# Patient Record
Sex: Female | Born: 1940 | Race: White | Hispanic: No | Marital: Married
Health system: Midwestern US, Community
[De-identification: ages and names within clinical notes are randomized; demographics above are authoritative.]

## PROBLEM LIST (undated history)

## (undated) DIAGNOSIS — E119 Type 2 diabetes mellitus without complications: Secondary | ICD-10-CM

## (undated) DIAGNOSIS — M25561 Pain in right knee: Secondary | ICD-10-CM

## (undated) DIAGNOSIS — C3412 Malignant neoplasm of upper lobe, left bronchus or lung: Secondary | ICD-10-CM

## (undated) DIAGNOSIS — C50911 Malignant neoplasm of unspecified site of right female breast: Secondary | ICD-10-CM

## (undated) DIAGNOSIS — C50919 Malignant neoplasm of unspecified site of unspecified female breast: Secondary | ICD-10-CM

## (undated) DIAGNOSIS — E782 Mixed hyperlipidemia: Secondary | ICD-10-CM

## (undated) DIAGNOSIS — M8580 Other specified disorders of bone density and structure, unspecified site: Secondary | ICD-10-CM

## (undated) DIAGNOSIS — N6310 Unspecified lump in the right breast, unspecified quadrant: Secondary | ICD-10-CM

## (undated) DIAGNOSIS — Z170 Estrogen receptor positive status [ER+]: Secondary | ICD-10-CM

## (undated) DIAGNOSIS — E559 Vitamin D deficiency, unspecified: Secondary | ICD-10-CM

## (undated) DIAGNOSIS — Z1231 Encounter for screening mammogram for malignant neoplasm of breast: Secondary | ICD-10-CM

## (undated) DIAGNOSIS — F330 Major depressive disorder, recurrent, mild: Secondary | ICD-10-CM

## (undated) DIAGNOSIS — M545 Low back pain, unspecified: Secondary | ICD-10-CM

## (undated) DIAGNOSIS — Z780 Asymptomatic menopausal state: Secondary | ICD-10-CM

## (undated) DIAGNOSIS — Z1239 Encounter for other screening for malignant neoplasm of breast: Secondary | ICD-10-CM

## (undated) DIAGNOSIS — I10 Essential (primary) hypertension: Secondary | ICD-10-CM

## (undated) DIAGNOSIS — E781 Pure hyperglyceridemia: Secondary | ICD-10-CM

## (undated) DIAGNOSIS — C7931 Secondary malignant neoplasm of brain: Secondary | ICD-10-CM

## (undated) DIAGNOSIS — G8929 Other chronic pain: Secondary | ICD-10-CM

## (undated) DIAGNOSIS — R198 Other specified symptoms and signs involving the digestive system and abdomen: Secondary | ICD-10-CM

## (undated) DIAGNOSIS — R7989 Other specified abnormal findings of blood chemistry: Secondary | ICD-10-CM

## (undated) DIAGNOSIS — R7301 Impaired fasting glucose: Secondary | ICD-10-CM

## (undated) DIAGNOSIS — M549 Dorsalgia, unspecified: Secondary | ICD-10-CM

## (undated) DIAGNOSIS — R928 Other abnormal and inconclusive findings on diagnostic imaging of breast: Secondary | ICD-10-CM

## (undated) DIAGNOSIS — R918 Other nonspecific abnormal finding of lung field: Secondary | ICD-10-CM

## (undated) DIAGNOSIS — R232 Flushing: Secondary | ICD-10-CM

## (undated) DIAGNOSIS — Z853 Personal history of malignant neoplasm of breast: Secondary | ICD-10-CM

## (undated) DIAGNOSIS — R531 Weakness: Secondary | ICD-10-CM

## (undated) DIAGNOSIS — R059 Cough, unspecified: Secondary | ICD-10-CM

## (undated) DIAGNOSIS — E673 Hypervitaminosis D: Secondary | ICD-10-CM

## (undated) DIAGNOSIS — C3490 Malignant neoplasm of unspecified part of unspecified bronchus or lung: Secondary | ICD-10-CM

## (undated) DIAGNOSIS — Z5112 Encounter for antineoplastic immunotherapy: Secondary | ICD-10-CM

## (undated) DIAGNOSIS — C439 Malignant melanoma of skin, unspecified: Secondary | ICD-10-CM

## (undated) DIAGNOSIS — E7800 Pure hypercholesterolemia, unspecified: Secondary | ICD-10-CM

## (undated) DIAGNOSIS — C4490 Unspecified malignant neoplasm of skin, unspecified: Secondary | ICD-10-CM

## (undated) DIAGNOSIS — R519 Headache, unspecified: Secondary | ICD-10-CM

## (undated) DIAGNOSIS — R51 Headache: Secondary | ICD-10-CM

## (undated) DIAGNOSIS — C801 Malignant (primary) neoplasm, unspecified: Secondary | ICD-10-CM

## (undated) DIAGNOSIS — M1990 Unspecified osteoarthritis, unspecified site: Secondary | ICD-10-CM

## (undated) HISTORY — DX: Other specified disorders of bone density and structure, unspecified site: M85.80

## (undated) HISTORY — DX: Pure hypercholesterolemia, unspecified: E78.00

## (undated) HISTORY — PX: OTHER SURGICAL HISTORY: SHX169

## (undated) HISTORY — DX: Unspecified malignant neoplasm of skin, unspecified: C44.90

## (undated) HISTORY — PX: HYSTEROSCOPY: SHX211

## (undated) HISTORY — DX: Malignant (primary) neoplasm, unspecified: C80.1

## (undated) HISTORY — DX: Malignant melanoma of skin, unspecified: C43.9

---
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
## 1999-03-01 ENCOUNTER — Other Ambulatory Visit: Admission: RE | Admit: 1999-03-01 | Discharge: 1999-03-01 | Payer: Self-pay | Admitting: Obstetrics and Gynecology

## 2000-03-27 ENCOUNTER — Other Ambulatory Visit: Admission: RE | Admit: 2000-03-27 | Discharge: 2000-03-27 | Payer: Self-pay | Admitting: Obstetrics and Gynecology

## 2000-04-05 ENCOUNTER — Encounter: Admission: RE | Admit: 2000-04-05 | Discharge: 2000-04-05 | Payer: Self-pay | Admitting: Obstetrics and Gynecology

## 2000-04-05 ENCOUNTER — Encounter: Payer: Self-pay | Admitting: Obstetrics and Gynecology

## 2001-08-14 ENCOUNTER — Other Ambulatory Visit: Admission: RE | Admit: 2001-08-14 | Discharge: 2001-08-14 | Payer: Self-pay | Admitting: Obstetrics and Gynecology

## 2002-10-29 ENCOUNTER — Other Ambulatory Visit: Admission: RE | Admit: 2002-10-29 | Discharge: 2002-10-29 | Payer: Self-pay | Admitting: Obstetrics and Gynecology

## 2003-11-12 ENCOUNTER — Other Ambulatory Visit: Admission: RE | Admit: 2003-11-12 | Discharge: 2003-11-12 | Payer: Self-pay | Admitting: Obstetrics and Gynecology

## 2005-08-04 ENCOUNTER — Other Ambulatory Visit: Admission: RE | Admit: 2005-08-04 | Discharge: 2005-08-04 | Payer: Self-pay | Admitting: Obstetrics and Gynecology

## 2006-09-18 ENCOUNTER — Other Ambulatory Visit: Admission: RE | Admit: 2006-09-18 | Discharge: 2006-09-18 | Payer: Self-pay | Admitting: Obstetrics and Gynecology

## 2007-07-26 ENCOUNTER — Ambulatory Visit: Payer: Self-pay | Admitting: Internal Medicine

## 2007-08-09 ENCOUNTER — Ambulatory Visit: Payer: Self-pay | Admitting: Internal Medicine

## 2008-03-13 ENCOUNTER — Other Ambulatory Visit: Admission: RE | Admit: 2008-03-13 | Discharge: 2008-03-13 | Payer: Self-pay | Admitting: Obstetrics and Gynecology

## 2008-03-27 DIAGNOSIS — E785 Hyperlipidemia, unspecified: Secondary | ICD-10-CM | POA: Insufficient documentation

## 2008-06-16 ENCOUNTER — Ambulatory Visit (HOSPITAL_COMMUNITY): Admission: RE | Admit: 2008-06-16 | Discharge: 2008-06-16 | Payer: Self-pay | Admitting: Obstetrics and Gynecology

## 2009-01-15 NOTE — Procedures (Deleted)
 [facility]   [phone]   [place] [address]    Name: KENLEI, SAFI Service Date: 01/10/2009  DOB: September 01, 1941 Ordered by:  MR # [id number] Location:  Sex: F Age: 68  Billing #: [id number] Date of Adm: 01/10/2009       BILATERAL LOWER EXTREMITY VENOUS DUPLEX    REQUESTED BY: Argentina Donovan, M.D.    CLINICAL DIAGNOSIS/INDICATIONS: This is a 68 year old female with  left-sided weakness, TIA.    FINDINGS: Wallace Cullens scale and color flow duplex images of the extracranial,  carotid and vertebral arteries were performed bilaterally demonstrating  plaquing in the right proximal ICA, marked tortuosity in the distal ICA  with flow acceleration appears to be at 160 and end diastolic of 34 in the  distal ICA. Again minimal plaquing seen in the proximal ICA without  hemodynamically significant flow disturbance. Vertebrals and external  carotid arteries bilaterally.    INTERPRETATION: There is 0-49% right ICA stenosis possibly related to  tortuosity. Externals and vertebrals have normal antegrade flow.        Preliminary  Ross Marcus, MD    cc: Lucille Passy. Port, M.D.   Ross Marcus, MD        CDS/dhm; D: 01/15/2009 11:39 A; T: 01/15/2009 1:11 P; DOC# [id number]; JOB#  [id number]

## 2009-01-16 NOTE — Procedures (Signed)
Procedures filed by Davy Pique, MD at 02/02/09 [phone]                Author: Davy Pique, MD  Service: --  Author Type: Physician       Filed: 02/02/09 0754  Date of Service: 01/16/09 0905  Status: Addendum          Editor: Davy Pique, MD (Physician)            Procedure Orders        1. TRANSCRIBED CAROTID DUPLEX SCAN [[id number]] ordered by  at 01/16/09 0905                         <!--EPICS-->                   [place] [facility]<BR>                         [address]<BR>                             [place], [place] [address]<BR> <BR> <BR> Name:      Dawn Green, Dawn Green                  Service Date:   01/10/2009<BR> DOB:       26-Nov-1940                     Ordered by:<BR> MR #       [id number]                      Location:<BR> Sex:       F                               Age:            67<BR> Billing #: [id number]                   Date of Adm:    01/10/2009<BR> <BR>                             CAROTID DUPLEX SCAN<BR> <BR> REQUESTED BY:  Argentina Donovan, M.D.<BR> <BR> CLINICAL DIAGNOSIS/INDICATIONS:  This is a 68 year old female with<BR> left-sided weakness, TIA.<BR> <BR> FINDINGS:  Gray scale and color flow duplex images of the extracranial,<BR> carotid and vertebral arteries were performed bilaterally demonstrating<BR> plaquing in the right proximal  ICA, marked tortuosity in the distal ICA<BR> with flow acceleration appears to be at 160 and end diastolic of 34 in the<BR> distal ICA.  Again minimal plaquing seen in the proximal ICA without<BR> hemodynamically significant flow disturbance.  Vertebrals  and external<BR> carotid arteries bilaterally.<BR> <BR> INTERPRETATION:  There is 0-49% right ICA stenosis possibly related to<BR> tortuosity.  Externals and vertebrals have normal antegrade flow.<BR> <BR> <BR> E-Signed By<BR> Ross Marcus, MD 02/02/2009  07:54<BR> Teea Ducey D Tarron Krolak, MD<BR> <BR> cc:   Lucille Passy. Port, M.D.<BR>       Godwin Tedesco D Suhana Wilner, MD<BR>  <BR> <BR> <BR> CDS/dhm; D:  01/15/2009 11:39 A; T:  01/16/2009  9:05 A; DOC# [id number]; JOB#<BR> [id number]<WN> <!--EPICE-->

## 2009-01-16 NOTE — Procedures (Addendum)
[place] [facility]   [address]   [place], [place] [address]      Name: Dawn Green, Dawn Green Service Date: 01/10/2009  DOB: 02/06/1941 Ordered by:  MR # [id number] Location:  Sex: F Age: 68  Billing #: [id number] Date of Adm: 01/10/2009     CAROTID DUPLEX SCAN    REQUESTED BY: Argentina Donovan, M.D.    CLINICAL DIAGNOSIS/INDICATIONS: This is a 68 year old female with  left-sided weakness, TIA.    FINDINGS: Wallace Cullens scale and color flow duplex images of the extracranial,  carotid and vertebral arteries were performed bilaterally demonstrating  plaquing in the right proximal ICA, marked tortuosity in the distal ICA  with flow acceleration appears to be at 160 and end diastolic of 34 in the  distal ICA. Again minimal plaquing seen in the proximal ICA without  hemodynamically significant flow disturbance. Vertebrals and external  carotid arteries bilaterally.    INTERPRETATION: There is 0-49% right ICA stenosis possibly related to  tortuosity. Externals and vertebrals have normal antegrade flow.      E-Signed By  Ross Marcus, MD 02/02/2009 07:54  Ross Marcus, MD    cc: Lucille Passy. Port, M.D.   Ross Marcus, MD        CDS/dhm; D: 01/15/2009 11:39 A; T: 01/16/2009 9:05 A; DOC# [id number]; JOB#  [id number]

## 2010-02-05 NOTE — Telephone Encounter (Signed)
Spoke with pt. Mammo ok follow up 1 year.

## 2012-01-20 ENCOUNTER — Encounter

## 2012-01-27 DIAGNOSIS — H5310 Unspecified subjective visual disturbances: Secondary | ICD-10-CM | POA: Diagnosis not present

## 2012-02-27 ENCOUNTER — Other Ambulatory Visit (HOSPITAL_COMMUNITY): Payer: Self-pay | Admitting: Internal Medicine

## 2012-02-27 DIAGNOSIS — R635 Abnormal weight gain: Secondary | ICD-10-CM | POA: Diagnosis not present

## 2012-02-27 DIAGNOSIS — Z23 Encounter for immunization: Secondary | ICD-10-CM | POA: Diagnosis not present

## 2012-02-27 DIAGNOSIS — C439 Malignant melanoma of skin, unspecified: Secondary | ICD-10-CM | POA: Diagnosis not present

## 2012-02-27 DIAGNOSIS — Z1231 Encounter for screening mammogram for malignant neoplasm of breast: Secondary | ICD-10-CM

## 2012-02-27 DIAGNOSIS — R9431 Abnormal electrocardiogram [ECG] [EKG]: Secondary | ICD-10-CM | POA: Diagnosis not present

## 2012-03-05 ENCOUNTER — Other Ambulatory Visit: Payer: Self-pay

## 2012-03-05 ENCOUNTER — Ambulatory Visit (HOSPITAL_COMMUNITY): Payer: Medicare Other | Attending: Internal Medicine

## 2012-03-05 ENCOUNTER — Other Ambulatory Visit (HOSPITAL_COMMUNITY): Payer: Self-pay | Admitting: Radiology

## 2012-03-05 DIAGNOSIS — I059 Rheumatic mitral valve disease, unspecified: Secondary | ICD-10-CM | POA: Insufficient documentation

## 2012-03-05 DIAGNOSIS — R9431 Abnormal electrocardiogram [ECG] [EKG]: Secondary | ICD-10-CM

## 2012-03-05 DIAGNOSIS — I519 Heart disease, unspecified: Secondary | ICD-10-CM | POA: Diagnosis not present

## 2012-03-05 DIAGNOSIS — E785 Hyperlipidemia, unspecified: Secondary | ICD-10-CM | POA: Insufficient documentation

## 2012-03-06 ENCOUNTER — Encounter (HOSPITAL_COMMUNITY): Payer: Self-pay | Admitting: Internal Medicine

## 2012-03-22 DIAGNOSIS — F4322 Adjustment disorder with anxiety: Secondary | ICD-10-CM | POA: Diagnosis not present

## 2012-03-23 ENCOUNTER — Ambulatory Visit (HOSPITAL_COMMUNITY)
Admission: RE | Admit: 2012-03-23 | Discharge: 2012-03-23 | Disposition: A | Discharge status: Home / Self Care | Payer: Medicare Other | Source: Ambulatory Visit | Attending: Internal Medicine | Admitting: Internal Medicine

## 2012-03-23 DIAGNOSIS — Z1231 Encounter for screening mammogram for malignant neoplasm of breast: Secondary | ICD-10-CM | POA: Diagnosis not present

## 2012-04-03 DIAGNOSIS — Z780 Asymptomatic menopausal state: Secondary | ICD-10-CM | POA: Diagnosis not present

## 2012-04-03 DIAGNOSIS — L570 Actinic keratosis: Secondary | ICD-10-CM | POA: Diagnosis not present

## 2012-04-03 DIAGNOSIS — Z85820 Personal history of malignant melanoma of skin: Secondary | ICD-10-CM | POA: Diagnosis not present

## 2012-04-06 DIAGNOSIS — L255 Unspecified contact dermatitis due to plants, except food: Secondary | ICD-10-CM | POA: Diagnosis not present

## 2012-04-06 DIAGNOSIS — IMO0002 Reserved for concepts with insufficient information to code with codable children: Secondary | ICD-10-CM | POA: Diagnosis not present

## 2012-04-09 DIAGNOSIS — L255 Unspecified contact dermatitis due to plants, except food: Secondary | ICD-10-CM | POA: Diagnosis not present

## 2012-04-11 DIAGNOSIS — F4322 Adjustment disorder with anxiety: Secondary | ICD-10-CM | POA: Diagnosis not present

## 2012-05-03 DIAGNOSIS — F4322 Adjustment disorder with anxiety: Secondary | ICD-10-CM | POA: Diagnosis not present

## 2012-06-01 DIAGNOSIS — F4322 Adjustment disorder with anxiety: Secondary | ICD-10-CM | POA: Diagnosis not present

## 2012-06-14 DIAGNOSIS — F4322 Adjustment disorder with anxiety: Secondary | ICD-10-CM | POA: Diagnosis not present

## 2012-07-05 DIAGNOSIS — F4322 Adjustment disorder with anxiety: Secondary | ICD-10-CM | POA: Diagnosis not present

## 2012-07-26 DIAGNOSIS — F4322 Adjustment disorder with anxiety: Secondary | ICD-10-CM | POA: Diagnosis not present

## 2012-08-23 DIAGNOSIS — E559 Vitamin D deficiency, unspecified: Secondary | ICD-10-CM | POA: Diagnosis not present

## 2012-08-23 DIAGNOSIS — E785 Hyperlipidemia, unspecified: Secondary | ICD-10-CM | POA: Diagnosis not present

## 2012-08-23 DIAGNOSIS — Z0000 Encounter for general adult medical examination without abnormal findings: Secondary | ICD-10-CM | POA: Diagnosis not present

## 2012-08-29 DIAGNOSIS — E559 Vitamin D deficiency, unspecified: Secondary | ICD-10-CM | POA: Diagnosis not present

## 2012-08-29 DIAGNOSIS — M25519 Pain in unspecified shoulder: Secondary | ICD-10-CM | POA: Diagnosis not present

## 2012-08-29 DIAGNOSIS — Z1331 Encounter for screening for depression: Secondary | ICD-10-CM | POA: Diagnosis not present

## 2012-08-29 DIAGNOSIS — R9431 Abnormal electrocardiogram [ECG] [EKG]: Secondary | ICD-10-CM | POA: Diagnosis not present

## 2012-09-05 DIAGNOSIS — F4322 Adjustment disorder with anxiety: Secondary | ICD-10-CM | POA: Diagnosis not present

## 2012-09-11 DIAGNOSIS — M25519 Pain in unspecified shoulder: Secondary | ICD-10-CM | POA: Diagnosis not present

## 2012-09-18 DIAGNOSIS — Z85820 Personal history of malignant melanoma of skin: Secondary | ICD-10-CM | POA: Diagnosis not present

## 2012-10-19 DIAGNOSIS — F4322 Adjustment disorder with anxiety: Secondary | ICD-10-CM | POA: Diagnosis not present

## 2013-01-18 ENCOUNTER — Encounter

## 2013-02-13 NOTE — Progress Notes (Signed)
Returned patient's phone call.  Patient wanting to know what to expect for ultrasound guided biopsy.  Explained procedure to patient with her understanding.  Instructed patient to call if she had any other questions.

## 2013-03-07 ENCOUNTER — Encounter

## 2013-03-07 DIAGNOSIS — H43819 Vitreous degeneration, unspecified eye: Secondary | ICD-10-CM | POA: Diagnosis not present

## 2013-03-07 DIAGNOSIS — H2510 Age-related nuclear cataract, unspecified eye: Secondary | ICD-10-CM | POA: Diagnosis not present

## 2013-03-07 DIAGNOSIS — H4011X0 Primary open-angle glaucoma, stage unspecified: Secondary | ICD-10-CM | POA: Diagnosis not present

## 2013-03-07 DIAGNOSIS — H52209 Unspecified astigmatism, unspecified eye: Secondary | ICD-10-CM | POA: Diagnosis not present

## 2013-03-07 NOTE — Progress Notes (Addendum)
HISTORY OF PRESENT ILLNESS  Dawn Green is a 72 y.o. female.  HPI ESTABLISHED patient here for recent discovery of RIGHT breast mass found on screening ma\\mmogram.  Patient cannot feel a mass.  She has no pain, nipple discharge/retraction, edema or discoloration.  Family history: mother diagnosed with ovarian cancer at age 39, deceased at age 22.  Paternal aunt diagnosed with pancreatic cancer at age 68    02/13/13 Mammogram/Ultrasound:   There is a persistent 8 mm nodular focus at the 12:00 position   of the right breast 2.5 cm from the nipple. There is an additional   ill-defined 9.4 mm nodular focus located at the 9:00 position of the right   breast, approximately 6.6 cm from the nipple, that partially compresses with   fat.     Solid mass at the 12:00 position of the right breast, corresponding to the   mammographic finding. Ultrasound-guided biopsy is recommended.   Probable lymph node or fibroglandular island at the 9:00 position of the   right breast, for which a six-month followup mammogram is recommended.   The patient was notified of these results. The patient will be referred to   Dr. Zonia Kief.   BIRADS 4: Suspicious: Biopsy is recommended.   CC: Dawn Comings, MD   Signing/Reading Doctor: Monico Blitz [phone])   Approved: Monico Blitz [phone]) Feb 13 2013 2:11PM   Review of Systems   Constitutional: Negative.    HENT: Positive for tinnitus.    Eyes:        Glaucoma, glasses,bilateral cataract w/ IOL   Respiratory: Negative.    Cardiovascular: Negative.    Gastrointestinal:        Lactose intolerant   Genitourinary: Positive for frequency.   Musculoskeletal:        RIHGT shoulder pain   Skin: Positive for itching.   Neurological: Negative.    Endo/Heme/Allergies: Negative.    Psychiatric/Behavioral: Negative.        Physical Exam   Constitutional: She is oriented to person, place, and time. She appears well-developed and well-nourished.   Cardiovascular: Normal rate, regular rhythm and normal heart  sounds.    Pulmonary/Chest: Effort normal and breath sounds normal. Right breast exhibits no mass, no nipple discharge and no skin change. Left breast exhibits no mass, no nipple discharge and no skin change. Breasts are symmetrical.   Lymphadenopathy:     She has no cervical adenopathy.     She has no axillary adenopathy.        Right: No supraclavicular adenopathy present.        Left: No supraclavicular adenopathy present.   Neurological: She is alert and oriented to person, place, and time.   Skin: Skin is warm and dry.     US - Guided Core Biopsy  Indication : Right Breast mass 12:00 1/3.  not palpable.  Ultrasound Findings: 8mm irregular mass 12:00 1/3  Prep : alcohol.   Anesthesia : 1% lidocaine with epinephrine, 5cc.    Device : The suction needle device was advanced through the lesion and captured tissue with real-time Ultrasound Confirmation.   Core Sampling :  4 cores were obtained.    Marker: clip placed   Dressing : Steristrips, gauze and tape.   Instructions : Remove gauze this evening.  Remove steristrips in one week.   Tolerance: Pt tolerated procedure with no discomfort  Pathology :  Infiltrating ductal carcinoma, grade 2  Concordance: yes  ASSESSMENT and PLAN    ICD-9-CM  1. Breast mass 611.72     Called patient with pathology report.  Will schedule right lumpectomy and sentinel lymph node biopsy.      [facility]  OFFICE PROCEDURE TIME OUT NOTE  Chart reviewed for the following:   I, Dawn Comings, MD, have reviewed the History, Physical and updated the Allergic reactions for Dawn Green   TIME OUT performed immediately prior to start of procedure:   I, Dawn Comings, MD, have performed the following reviews on Dawn Green prior to the start of the procedure:          * Patient was identified by name and date of birth   * Agreement on procedure being performed was verified  * Risks and Benefits explained to the patient  * Procedure site verified and marked as necessary  * Patient was  positioned for comfort  * Consent was signed and verified   Time: 10:20am  Date of procedure: 03/07/2013  Procedure performed by:  Dawn Comings, MD  Provider assisted by: Ivin Poot  LPN  How tolerated by patient: tolerated the procedure well with no complications

## 2013-03-12 NOTE — Communication Body (Signed)
HISTORY OF PRESENT ILLNESS  Dawn Green is a 72 y.o. female.  HPI ESTABLISHED patient here for recent discovery of RIGHT breast mass found on screening ma\\mmogram.  Patient cannot feel a mass.  She has no pain, nipple discharge/retraction, edema or discoloration.  Family history: mother diagnosed with ovarian cancer at age 70, deceased at age 44.  Paternal aunt diagnosed with pancreatic cancer at age 10    02/13/13 Mammogram/Ultrasound:   There is a persistent 8 mm nodular focus at the 12:00 position   of the right breast 2.5 cm from the nipple. There is an additional   ill-defined 9.4 mm nodular focus located at the 9:00 position of the right   breast, approximately 6.6 cm from the nipple, that partially compresses with   fat.     Solid mass at the 12:00 position of the right breast, corresponding to the   mammographic finding. Ultrasound-guided biopsy is recommended.   Probable lymph node or fibroglandular island at the 9:00 position of the   right breast, for which a six-month followup mammogram is recommended.   The patient was notified of these results. The patient will be referred to   Dr. Zonia Kief.   BIRADS 4: Suspicious: Biopsy is recommended.   CC: [name], MD   Signing/Reading Doctor: Monico Blitz [phone])   Approved: Monico Blitz [phone]) Feb 13 2013 2:11PM   Review of Systems   Constitutional: Negative.    HENT: Positive for tinnitus.    Eyes:        Glaucoma, glasses,bilateral cataract w/ IOL   Respiratory: Negative.    Cardiovascular: Negative.    Gastrointestinal:        Lactose intolerant   Genitourinary: Positive for frequency.   Musculoskeletal:        RIHGT shoulder pain   Skin: Positive for itching.   Neurological: Negative.    Endo/Heme/Allergies: Negative.    Psychiatric/Behavioral: Negative.        Physical Exam   Constitutional: She is oriented to person, place, and time. She appears well-developed and well-nourished.   Cardiovascular: Normal rate, regular rhythm and normal heart  sounds.    Pulmonary/Chest: Effort normal and breath sounds normal. Right breast exhibits no mass, no nipple discharge and no skin change. Left breast exhibits no mass, no nipple discharge and no skin change. Breasts are symmetrical.   Lymphadenopathy:     She has no cervical adenopathy.     She has no axillary adenopathy.        Right: No supraclavicular adenopathy present.        Left: No supraclavicular adenopathy present.   Neurological: She is alert and oriented to person, place, and time.   Skin: Skin is warm and dry.     US - Guided Core Biopsy  Indication : Right Breast mass 12:00 1/3.  not palpable.  Ultrasound Findings: 8mm irregular mass 12:00 1/3  Prep : alcohol.   Anesthesia : 1% lidocaine with epinephrine, 5cc.    Device : The suction needle device was advanced through the lesion and captured tissue with real-time Ultrasound Confirmation.   Core Sampling :  4 cores were obtained.    Marker: clip placed   Dressing : Steristrips, gauze and tape.   Instructions : Remove gauze this evening.  Remove steristrips in one week.   Tolerance: Pt tolerated procedure with no discomfort  Pathology :  Infiltrating ductal carcinoma, grade 2  Concordance: yes  ASSESSMENT and PLAN    ICD-9-CM  1. Breast mass 611.72     Called patient with pathology report.  Will schedule right lumpectomy and sentinel lymph node biopsy.      [facility]  OFFICE PROCEDURE TIME OUT NOTE  Chart reviewed for the following:   I, [name], MD, have reviewed the History, Physical and updated the Allergic reactions for [name]   TIME OUT performed immediately prior to start of procedure:   I, [name], MD, have performed the following reviews on [name] prior to the start of the procedure:          * Patient was identified by name and date of birth   * Agreement on procedure being performed was verified  * Risks and Benefits explained to the patient  * Procedure site verified and marked as necessary  * Patient was  positioned for comfort  * Consent was signed and verified   Time: 10:20am  Date of procedure: 03/07/2013  Procedure performed by:  [name], MD  Provider assisted by: Ivin Poot  LPN  How tolerated by patient: tolerated the procedure well with no complications            HISTORY OF PRESENT ILLNESS  Dawn Green is a 72 y.o. female.  HPI ESTABLISHED patient here for recent discovery of RIGHT breast mass found on screening ma\\mmogram.  Patient cannot feel a mass.  She has no pain, nipple discharge/retraction, edema or discoloration.  Family history: mother diagnosed with ovarian cancer at age 30, deceased at age 61.  Paternal aunt diagnosed with pancreatic cancer at age 19    02/13/13 Mammogram/Ultrasound:   There is a persistent 8 mm nodular focus at the 12:00 position   of the right breast 2.5 cm from the nipple. There is an additional   ill-defined 9.4 mm nodular focus located at the 9:00 position of the right   breast, approximately 6.6 cm from the nipple, that partially compresses with   fat.     Solid mass at the 12:00 position of the right breast, corresponding to the   mammographic finding. Ultrasound-guided biopsy is recommended.   Probable lymph node or fibroglandular island at the 9:00 position of the   right breast, for which a six-month followup mammogram is recommended.   The patient was notified of these results. The patient will be referred to   Dr. Zonia Kief.   BIRADS 4: Suspicious: Biopsy is recommended.   CC: [name], MD   Signing/Reading Doctor: Monico Blitz [phone])   Approved: Monico Blitz [phone]) Feb 13 2013 2:11PM   Review of Systems   Constitutional: Negative.    HENT: Positive for tinnitus.    Eyes:        Glaucoma, glasses,bilateral cataract w/ IOL   Respiratory: Negative.    Cardiovascular: Negative.    Gastrointestinal:        Lactose intolerant   Genitourinary: Positive for frequency.   Musculoskeletal:        RIHGT shoulder pain   Skin: Positive for itching.    Neurological: Negative.    Endo/Heme/Allergies: Negative.    Psychiatric/Behavioral: Negative.        Physical Exam   Constitutional: She is oriented to person, place, and time. She appears well-developed and well-nourished.   Cardiovascular: Normal rate, regular rhythm and normal heart sounds.    Pulmonary/Chest: Effort normal and breath sounds normal. Right breast exhibits no mass, no nipple discharge and no skin change. Left breast exhibits no mass, no nipple  discharge and no skin change. Breasts are symmetrical.   Lymphadenopathy:     She has no cervical adenopathy.     She has no axillary adenopathy.        Right: No supraclavicular adenopathy present.        Left: No supraclavicular adenopathy present.   Neurological: She is alert and oriented to person, place, and time.   Skin: Skin is warm and dry.     US - Guided Core Biopsy  Indication : Right Breast mass 12:00 1/3.  not palpable.  Ultrasound Findings: 8mm irregular mass 12:00 1/3  Prep : alcohol.   Anesthesia : 1% lidocaine with epinephrine, 5cc.    Device : The suction needle device was advanced through the lesion and captured tissue with real-time Ultrasound Confirmation.   Core Sampling :  4 cores were obtained.    Marker: clip placed   Dressing : Steristrips, gauze and tape.   Instructions : Remove gauze this evening.  Remove steristrips in one week.   Tolerance: Pt tolerated procedure with no discomfort  Pathology :  Infiltrating ductal carcinoma, grade 2  Concordance: yes  ASSESSMENT and PLAN    ICD-9-CM   1. Breast mass 611.72     Called patient with pathology report.  Will schedule right lumpectomy and sentinel lymph node biopsy.      [facility]  OFFICE PROCEDURE TIME OUT NOTE  Chart reviewed for the following:   I, [name], MD, have reviewed the History, Physical and updated the Allergic reactions for [name]   TIME OUT performed immediately prior to start of procedure:   I, [name], MD, have performed the following  reviews on AMARIONNA ARCA prior to the start of the procedure:          * Patient was identified by name and date of birth   * Agreement on procedure being performed was verified  * Risks and Benefits explained to the patient  * Procedure site verified and marked as necessary  * Patient was positioned for comfort  * Consent was signed and verified   Time: 10:20am  Date of procedure: 03/07/2013  Procedure performed by:  [name], MD  Provider assisted by: Ivin Poot  LPN  How tolerated by patient: tolerated the procedure well with no complications

## 2013-03-12 NOTE — Telephone Encounter (Signed)
right lumpectomy and sentinel lymph node biopsy.

## 2013-03-12 NOTE — Addendum Note (Signed)
Addended by: Wallie Char on: 03/12/2013 09:29 AM     Modules accepted: Level of Service

## 2013-03-19 ENCOUNTER — Encounter

## 2013-03-19 DIAGNOSIS — L820 Inflamed seborrheic keratosis: Secondary | ICD-10-CM | POA: Diagnosis not present

## 2013-03-19 DIAGNOSIS — Z85820 Personal history of malignant melanoma of skin: Secondary | ICD-10-CM | POA: Diagnosis not present

## 2013-03-19 NOTE — Progress Notes (Signed)
Faxed pathology report to PCP as per patients request

## 2013-03-19 NOTE — Telephone Encounter (Signed)
breast MRI

## 2013-03-20 NOTE — Telephone Encounter (Signed)
03/26/2013 1345 - 30 min Mri Scanner [facility] (Resource) [facility]

## 2013-03-26 LAB — CREATININE, POC
Creatinine (POC): 0.8 MG/DL (ref 0.6–1.3)
GFRAA, POC: >60 mL/min/{1.73_m2} (ref >60)
GFRNA, POC: >60 mL/min/{1.73_m2} (ref >60)

## 2013-03-26 MED ORDER — GADOPENTETATE DIMEGLUMINE 10 MMOL/20 ML (469.01 MG/ML) IV
10 mmol/20 mL (469.01 mg/mL) | Freq: Once | INTRAVENOUS | Status: AC
Start: 2013-03-26 — End: 2013-03-26
  Administered 2013-03-26: 18:00:00 via INTRAVENOUS

## 2013-03-26 MED FILL — MAGNEVIST 10 MMOL/20 ML (469.01 MG/ML) INTRAVENOUS SOLUTION: 10 mmol/20 mL (469.01 mg/mL) | INTRAVENOUS | Qty: 20

## 2013-03-26 NOTE — Telephone Encounter (Signed)
Called patient with MRI result    1. Right BI-RADS 6, known malignancy. Left BI-RADS 2, benign.  2. RIGHT BREAST: Known invasive ductal carcinoma at 12:00 in the nipple   line   anteriorly, with no MRI evidence for multicentric disease. This lesion   should be amenable to breast conserving therapy. Please see dimensions and     clip location above for surgical planning.  3. LEFT BREAST: No MRI sign of contralateral malignancy.  4. A summary portfolio has been created for reference and is available in   PACS.

## 2013-04-17 ENCOUNTER — Encounter

## 2013-04-17 LAB — METABOLIC PANEL, BASIC
Anion gap: 6 mmol/L (ref 5–15)
BUN/Creatinine ratio: 30 — ABNORMAL HIGH (ref 12–20)
BUN: 17 MG/DL (ref 6–20)
CO2: 33 mmol/L — ABNORMAL HIGH (ref 21–32)
Calcium: 9.6 MG/DL (ref 8.5–10.1)
Chloride: 104 mmol/L (ref 97–108)
Creatinine: 0.56 MG/DL (ref 0.45–1.15)
GFR est AA: >60 mL/min/{1.73_m2} (ref >60)
GFR est non-AA: >60 mL/min/{1.73_m2} (ref >60)
Glucose: 98 mg/dL (ref 65–100)
Potassium: 4.3 mmol/L (ref 3.5–5.1)
Sodium: 143 mmol/L (ref 136–145)

## 2013-04-17 LAB — CBC WITH AUTOMATED DIFF
ABS. BASOPHILS: 0.1 10*3/uL (ref 0.0–0.1)
ABS. EOSINOPHILS: 0.2 10*3/uL (ref 0.0–0.4)
ABS. LYMPHOCYTES: 3 10*3/uL (ref 0.8–3.5)
ABS. MONOCYTES: 0.6 10*3/uL (ref 0.0–1.0)
ABS. NEUTROPHILS: 3 10*3/uL (ref 1.8–8.0)
BASOPHILS: 1 % (ref 0–1)
EOSINOPHILS: 3 % (ref 0–7)
HCT: 41.4 % (ref 35.0–47.0)
HGB: 13.9 g/dL (ref 11.5–16.0)
LYMPHOCYTES: 43 % (ref 12–49)
MCH: 30.8 PG (ref 26.0–34.0)
MCHC: 33.6 g/dL (ref 30.0–36.5)
MCV: 91.8 FL (ref 80.0–99.0)
MONOCYTES: 9 % (ref 5–13)
NEUTROPHILS: 44 % (ref 32–75)
PLATELET: 256 10*3/uL (ref 150–400)
RBC: 4.51 M/uL (ref 3.80–5.20)
RDW: 12.9 % (ref 11.5–14.5)
WBC: 6.9 10*3/uL (ref 3.6–11.0)

## 2013-04-17 LAB — EKG, 12 LEAD, INITIAL
Atrial Rate: 57 {beats}/min
Calculated P Axis: 50 degrees
Calculated R Axis: 7 degrees
Calculated T Axis: 54 degrees
P-R Interval: 180 ms
Q-T Interval: 424 ms
QRS Duration: 88 ms
QTC Calculation (Bezet): 412 ms
Ventricular Rate: 57 {beats}/min

## 2013-04-17 NOTE — Other (Signed)
[facility]  PREOPERATIVE INSTRUCTIONS    Surgery Date:   Wednesday 04/24/13   Surgery arrival time given by surgeon: YES 6:00am per Dr. Zonia Kief instructions    1. Please report at the designated time to the 2nd Floor Admitting Desk.  Bring your insurance card, photo identification, and any copayment ( if applicable).  2. You must have a responsible adult to drive you home. You need to have a responsible adult to stay with you the first 24 hours after surgery if you are going home the same day of your surgery and you should not drive a car for 24 hours following your surgery.  3. Nothing to eat or drink after midnight the night before surgery. This includes no water, gum, mints, coffee, juice, etc.  Please note special instructions, if applicable, below for medications.  4. MEDICATIONS TO TAKE THE MORNING OF SURGERY WITH A SIP OF WATER: Lipitor, Diovan.  Use eye drops.  5. No alcoholic beverages 24 hours before or after your surgery.  6. If you are being admitted to the hospital,please leave personal belongings/luggage in your car until you have an assigned hospital room number.  7. Stop Aspirin and/or any non-steroidal anti-inflammatory drugs (i.e. Ibuprofen, Naproxen, Advil, Aleve) as directed by your surgeon.  You may take Tylenol.        Stop herbal supplements 1 week prior to  surgery.  8. If you are currently taking Plavix, Coumadin,or any other blood-thinning/anticoagulant medication contact your surgeon for instructions.  9. Please wear comfortable clothes. Wear your glasses instead of contacts. We ask that all money, jewelry and valuables be left at home. Wear no make up, particularly mascara, the day of surgery.   10.  All body piercings, rings,and jewelry need to be removed and left at home.    Please wear your hair loose or down. Please no pony-tails, buns, or any metal hair accessories. If you shower the morning of surgery, please do not apply any lotions, powders, or deodorants afterwards.   Do not shave any  body area within 24 hours of your surgery.  11. Please follow all instructions to avoid any potential surgical cancellation.  12.  Should your physical condition change, (i.e. fever, cold, flu, etc.) please notify your surgeon as soon as possible.  13. It is important to be on time. If a situation occurs where you may be delayed, please call: [phone] on the day of surgery.  14. The Preadmission Testing staff can be reached at [phone].Marland KitchenBeth M  15. Special instructions: Pain brochure reviewed and given to patient.  Valet Parking.    The patient was contacted  in person.   She  verbalize  understanding of all instructions does not  need reinforcement.

## 2013-04-17 NOTE — Other (Signed)
Consent order incomplete at time of PAT visit.  Left message on voicemail for Misty Stanley at Dr. Britt Boozer office asking for new consent order to be put in connect care.  Pt to sign consent day of surgery.

## 2013-04-18 NOTE — Telephone Encounter (Signed)
[facility] [place]  [phone]    [facility] #[phone] called in    Pt called with a number of questions regarding the name of her tumor, lymph node biopsy, activity level after surgery.   She wanted pain med called in advance.

## 2013-04-18 NOTE — Addendum Note (Signed)
Addended by: [name] on: 04/18/2013 08:33 AM     Modules accepted: Orders

## 2013-04-22 NOTE — Telephone Encounter (Signed)
REturned patient's call.  She states she has a sore throat and a slight cough.  She wanted [place] to know because her PAT documents states patients should report this.  She denies being febrile, states she told Dr. Zonia Kief that her husband was recently diagnosed with bronchitis and she's sure she has contracted that virus from him.  I encouraged her to call her PCP to see if she can be evaluated today or tomorrow.  I've also contacted [facility] PAT to inform of this new development.

## 2013-04-24 ENCOUNTER — Telehealth

## 2013-04-24 ENCOUNTER — Inpatient Hospital Stay: Payer: MEDICARE

## 2013-04-24 MED ORDER — PROPOFOL 10 MG/ML IV EMUL
10 mg/mL | INTRAVENOUS | Status: DC | PRN
Start: 2013-04-24 — End: 2013-04-24
  Administered 2013-04-24: 12:00:00 via INTRAVENOUS

## 2013-04-24 MED ORDER — BUPIVACAINE-EPINEPHRINE (PF) 0.5 %-1:200,000 IJ SOLN
0.5 %-1:200,000 | Freq: Once | INTRAMUSCULAR | Status: AC | PRN
Start: 2013-04-24 — End: 2013-04-24
  Administered 2013-04-24: 12:00:00 via SUBCUTANEOUS

## 2013-04-24 MED ORDER — METHYLENE BLUE (ANTIDOTE) 1 % IJ SOLN
1 % (0 mg/mL) | INTRAVENOUS | Status: AC
Start: 2013-04-24 — End: ?

## 2013-04-24 MED ORDER — DIPHENHYDRAMINE HCL 50 MG/ML IJ SOLN
50 mg/mL | INTRAMUSCULAR | Status: DC | PRN
Start: 2013-04-24 — End: 2013-04-24

## 2013-04-24 MED ORDER — MIDAZOLAM 1 MG/ML IJ SOLN
1 mg/mL | INTRAMUSCULAR | Status: AC
Start: 2013-04-24 — End: ?

## 2013-04-24 MED ORDER — LACTATED RINGERS IV
INTRAVENOUS | Status: DC
Start: 2013-04-24 — End: 2013-04-24

## 2013-04-24 MED ORDER — LIDOCAINE (PF) 20 MG/ML (2 %) IJ SOLN
20 mg/mL (2 %) | INTRAMUSCULAR | Status: DC | PRN
Start: 2013-04-24 — End: 2013-04-24
  Administered 2013-04-24: 12:00:00 via INTRAVENOUS

## 2013-04-24 MED ORDER — SODIUM CHLORIDE 0.9 % IJ SYRG
Freq: Three times a day (TID) | INTRAMUSCULAR | Status: DC
Start: 2013-04-24 — End: 2013-04-24

## 2013-04-24 MED ORDER — GLYCOPYRROLATE 0.2 MG/ML IJ SOLN
0.2 mg/mL | INTRAMUSCULAR | Status: DC | PRN
Start: 2013-04-24 — End: 2013-04-24
  Administered 2013-04-24: 12:00:00 via INTRAMUSCULAR

## 2013-04-24 MED ORDER — SODIUM CHLORIDE 0.9 % IJ SYRG
INTRAMUSCULAR | Status: DC | PRN
Start: 2013-04-24 — End: 2013-04-24

## 2013-04-24 MED ORDER — SODIUM CHLORIDE 0.9 % IV
INTRAVENOUS | Status: DC
Start: 2013-04-24 — End: 2013-04-24
  Administered 2013-04-24: 11:00:00 via INTRAVENOUS

## 2013-04-24 MED ORDER — SODIUM CHLORIDE 0.9 % IV
INTRAVENOUS | Status: DC | PRN
Start: 2013-04-24 — End: 2013-04-24
  Administered 2013-04-24: 12:00:00 via INTRAVENOUS

## 2013-04-24 MED ORDER — HYDROCODONE-ACETAMINOPHEN 5 MG-325 MG TAB
5-325 mg | ORAL_TABLET | ORAL | Status: DC | PRN
Start: 2013-04-24 — End: 2014-05-28

## 2013-04-24 MED ORDER — FENTANYL CITRATE (PF) 50 MCG/ML IJ SOLN
50 mcg/mL | INTRAMUSCULAR | Status: AC
Start: 2013-04-24 — End: ?

## 2013-04-24 MED ORDER — HYDROMORPHONE (PF) 1 MG/ML IJ SOLN
1 mg/mL | INTRAMUSCULAR | Status: DC | PRN
Start: 2013-04-24 — End: 2013-04-24
  Administered 2013-04-24: 14:00:00 via INTRAVENOUS

## 2013-04-24 MED ORDER — FLUMAZENIL 0.1 MG/ML IV SOLN
0.1 mg/mL | INTRAVENOUS | Status: DC | PRN
Start: 2013-04-24 — End: 2013-04-24

## 2013-04-24 MED ORDER — ONDANSETRON (PF) 4 MG/2 ML INJECTION
4 mg/2 mL | INTRAMUSCULAR | Status: DC | PRN
Start: 2013-04-24 — End: 2013-04-24

## 2013-04-24 MED ORDER — ONDANSETRON (PF) 4 MG/2 ML INJECTION
4 mg/2 mL | INTRAMUSCULAR | Status: DC | PRN
Start: 2013-04-24 — End: 2013-04-24
  Administered 2013-04-24: 12:00:00 via INTRAVENOUS

## 2013-04-24 MED ORDER — PROMETHAZINE 25 MG/ML INJECTION
25 mg/mL | INTRAMUSCULAR | Status: DC | PRN
Start: 2013-04-24 — End: 2013-04-24

## 2013-04-24 MED ORDER — LIDOCAINE (PF) 10 MG/ML (1 %) IJ SOLN
10 mg/mL (1 %) | INTRAMUSCULAR | Status: DC | PRN
Start: 2013-04-24 — End: 2013-04-24

## 2013-04-24 MED ORDER — BUPIVACAINE-EPINEPHRINE (PF) 0.5 %-1:200,000 IJ SOLN
0.5 %-1:200,000 | INTRAMUSCULAR | Status: AC
Start: 2013-04-24 — End: ?

## 2013-04-24 MED ORDER — METHYLENE BLUE (ANTIDOTE) 1 % IJ SOLN
1 % (0 mg/mL) | Freq: Once | INTRAVENOUS | Status: AC
Start: 2013-04-24 — End: 2013-04-24
  Administered 2013-04-24: 12:00:00 via SUBCUTANEOUS

## 2013-04-24 MED ORDER — NALOXONE 0.4 MG/ML INJECTION
0.4 mg/mL | INTRAMUSCULAR | Status: DC | PRN
Start: 2013-04-24 — End: 2013-04-24

## 2013-04-24 MED ORDER — DEXAMETHASONE SODIUM PHOSPHATE 10 MG/ML IJ SOLN
10 mg/mL | INTRAMUSCULAR | Status: DC | PRN
Start: 2013-04-24 — End: 2013-04-24
  Administered 2013-04-24: 12:00:00 via INTRAVENOUS

## 2013-04-24 MED ORDER — FENTANYL CITRATE (PF) 50 MCG/ML IJ SOLN
50 mcg/mL | INTRAMUSCULAR | Status: DC | PRN
Start: 2013-04-24 — End: 2013-04-24
  Administered 2013-04-24 (×4): via INTRAVENOUS

## 2013-04-24 MED FILL — FENTANYL CITRATE (PF) 50 MCG/ML IJ SOLN: 50 mcg/mL | INTRAMUSCULAR | Qty: 2

## 2013-04-24 MED FILL — METHYLENE BLUE (ANTIDOTE) 1 % IJ SOLN: 1 % (0 mg/mL) | INTRAVENOUS | Qty: 10

## 2013-04-24 MED FILL — BUPIVACAINE-EPINEPHRINE (PF) 0.5 %-1:200,000 IJ SOLN: 0.5 %-1:200,000 | INTRAMUSCULAR | Qty: 30

## 2013-04-24 MED FILL — HYDROMORPHONE (PF) 1 MG/ML IJ SOLN: 1 mg/mL | INTRAMUSCULAR | Qty: 1

## 2013-04-24 MED FILL — MIDAZOLAM 1 MG/ML IJ SOLN: 1 mg/mL | INTRAMUSCULAR | Qty: 2

## 2013-04-24 NOTE — Op Note (Signed)
Right Breast Lumpectomy with Sentinel Node Biopsy   Operative Report    Date of Surgery: 04/24/2013  Preoperative Diagnosis: Right breast cancer  Postoperative Diagnosis: Right breast cancer  Surgeon: Dr Zonia Kief   Anesthesia: General  Procedure: Right breast lumpectomy with sentinel lymph node biopsy   Indication: RIGHT breast cancer    Procedure:  Dawn Green was brought into the operating room and placed supine on the table.  She was induced with general anesthesia.  5cc of 1/2 strength methylene blue dye was injected in the RIGHT subareolar space and the breast was massage for 5 minutes.  The RIGHT breast and axilla were then prepped and draped in the usual sterile fashion.  0.5% marcaine was used for local anesthesia.  A 3cm incision was made in the lower axilla and the axilla was accessed with blunt dissection.  No blue dye was seen in the axilla.  3 enlarged lymph nodes were removed and sent to pathology.  They were grossly benign.  No other enlarged lymph nodes were palpated.  The breast tumor was in the upper breast 12:00 1cm from the nipple.  It was identified with ultrasound.  A 3cm periareolar incision was made.  Wide excision of the mass was performed using sharp dissection and electrocautery.  The specimen was marked with sutures for orientation purposes and sent to pathology.  Hemostasis was controlled with electrocautery.  Both incisions were closed in 2 layers with 3-0 vicryl for the subcutaneous tissue and 4-0 Monocryl for the skin.  The wounds were dressed with skin glue and the patient was awakened and extubated and taken to the recovery room.  Sponge, needle and instrument counts were reported be correct.     Findings:  Lymph nodes grossly benign.  Estimated Blood Loss: 30cc  Specimens: ID Type Source Tests Collected by Time Destination   1 : RIGHT AXILLARY SENTINEL NODES Frozen Section Lymph Node  [name], MD 04/24/2013 0800 Pathology   2 : RIGHT BREAST LUMPECTOMY Preservative Breast  [name], MD 04/24/2013 [phone] Pathology      Signed:  [name], MD

## 2013-04-24 NOTE — Telephone Encounter (Signed)
med onc and rad onc appts

## 2013-04-24 NOTE — Telephone Encounter (Signed)
IRVIN - 05/06/2013 @ 2:00 PM    WALKER - 05/15/2013 @ 10:30 AM    Patient has been notified

## 2013-04-24 NOTE — Anesthesia Pre-Procedure Evaluation (Signed)
Anesthetic History   No history of anesthetic complications           Review of Systems / Medical History  Patient summary reviewed and pertinent labs reviewed    Pulmonary              Comments: Current mild URI, nonproductive cough, no fever   Neuro/Psych   Within defined limits           Cardiovascular  Within defined limits  Hypertension            Exercise tolerance: >4 METS     GI/Hepatic/Renal  Within defined limits                Endo/Other  Within defined limits           Other Findings            Physical Exam    Airway  Mallampati: II  TM Distance: < 4 cm  Neck ROM: decreased range of motion   Mouth opening: Normal     Cardiovascular    Rhythm: regular  Rate: normal         Dental  No notable dental hx       Pulmonary  Breath sounds clear to auscultation               Abdominal         Other Findings            Anesthetic Plan    ASA: 2  Anesthesia type: general            Anesthetic plan and risks discussed with: Patient

## 2013-04-24 NOTE — H&P (Signed)
History and Physical    Surgery History and Physical          Dawn Green is a 72 y.o. female who presents for RIGHT lumpectomy.  RIGHT breast mass found on screening ma\\mmogram.  Patient cannot feel a mass. She has no pain, nipple discharge/retraction, edema or discoloration.  Family history: mother diagnosed with ovarian cancer at age 64, deceased at age 66.  Paternal aunt diagnosed with pancreatic cancer at age 80  02/13/13 Mammogram/Ultrasound:   There is a persistent 8 mm nodular focus at the 12:00 position   of the right breast 2.5 cm from the nipple. There is an additional   ill-defined 9.4 mm nodular focus located at the 9:00 position of the right   breast, approximately 6.6 cm from the nipple, that partially compresses with   fat.   Solid mass at the 12:00 position of the right breast, corresponding to the   mammographic finding. Ultrasound-guided biopsy is recommended.   Probable lymph node or fibroglandular island at the 9:00 position of the   right breast, for which a six-month followup mammogram is recommended.   The patient was notified of these results. The patient will be referred to   Dr. Dotty Gonzalo.   BIRADS 4: Suspicious: Biopsy is recommended.   CC: Kolbee Bogusz, MD   Signing/Reading Doctor: SUSAN PRIZZIA ([id number])   Approved: SUSAN PRIZZIA ([id number]) Feb 13 2013 2:11PM   Review of Systems   Constitutional: Negative.   HENT: Positive for tinnitus.   Eyes:   Glaucoma, glasses,bilateral cataract w/ IOL   Respiratory: Negative.   Cardiovascular: Negative.   Gastrointestinal:   Lactose intolerant   Genitourinary: Positive for frequency.   Musculoskeletal:   RIHGT shoulder pain   Skin: Positive for itching.   Neurological: Negative.   Endo/Heme/Allergies: Negative.   Psychiatric/Behavioral: Negative.   Physical Exam   Constitutional: She is oriented to person, place, and time. She appears well-developed and well-nourished.   Cardiovascular: Normal rate, regular rhythm and normal heart sounds.    Pulmonary/Chest: Effort normal and breath sounds normal. Right breast exhibits no mass, no nipple discharge and no skin change. Left breast exhibits no mass, no nipple discharge and no skin change. Breasts are symmetrical.   Lymphadenopathy:   She has no cervical adenopathy.   She has no axillary adenopathy.   Right: No supraclavicular adenopathy present.   Left: No supraclavicular adenopathy present.   Neurological: She is alert and oriented to person, place, and time.   Skin: Skin is warm and dry.   US - Guided Core Biopsy  Indication : Right Breast mass 12:00 1/3. not palpable.   Ultrasound Findings: 8mm irregular mass 12:00 1/3  Prep : alcohol.   Anesthesia : 1% lidocaine with epinephrine, 5cc.   Device : The suction needle device was advanced through the lesion and captured tissue with real-time Ultrasound Confirmation.   Core Sampling : 4 cores were obtained.   Marker: clip placed   Dressing : Steristrips, gauze and tape.   Instructions : Remove gauze this evening. Remove steristrips in one week.   Tolerance: Pt tolerated procedure with no discomfort  Pathology : Infiltrating ductal carcinoma, grade 2   Concordance: yes   ASSESSMENT and PLAN    ICD-9-CM    1.  Breast mass  611.72    Called patient with pathology report.   Will schedule right lumpectomy and sentinel lymph node biopsy.      Past Medical History   Diagnosis Date   ???   Hypertension    ??? Cancer      bcc chest, nose, lip, right breast cancer   ??? 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 Other ill-defined conditions      glaucoma     Past Surgical History   Procedure Laterality Date   ??? Hx cataract removal Bilateral      w/ IOL implants   ??? Hx tah and bso  1997      Family History   Problem Relation Age of Onset   ??? Cancer Mother 64     ovarian   ??? Cancer Son 28     pancreatic   ??? Cancer Paternal Aunt 80     pancreatic     History   Substance Use Topics   ??? Smoking status: Former Smoker -- 1.00 packs/day for 30 years     Quit date:  04/17/1996   ??? Smokeless tobacco: Never Used   ??? Alcohol Use: 7.0 oz/week     14 drink(s) per week      Comment: 14 gin and tonics/wk      Prior to Admission medications    Medication Sig Start Date End Date Taking? Authorizing Provider   CALCIUM CARBONATE (CALCIUM 500 PO) Take 1 Tab by mouth Daily (before breakfast).   Yes Historical Provider   cholecalciferol (VITAMIN D3) 400 unit tab tablet Take 400 Units by mouth daily.   Yes Historical Provider   brimonidine-timolol (COMBIGAN) 0.2-0.5 % drop ophthalmic solution Administer 1 Drop to both eyes every twelve (12) hours.   Yes Historical Provider   valsartan (DIOVAN) 80 mg tablet Take 80 mg by mouth Daily (before breakfast).   Yes Historical Provider   atorvastatin (LIPITOR) 40 mg tablet Take 40 mg by mouth Daily (before breakfast).   Yes Historical Provider   aspirin 81 mg chewable tablet Take 81 mg by mouth daily.   Yes Historical Provider   Omega-3-DHA-EPA-Fish Oil 1,000 (120-180) mg cap Take 2,400 mg by mouth two (2) times a day.   Yes Historical Provider   multivitamin (ONE A DAY) tablet Take 1 Tab by mouth daily.   Yes Historical Provider   B.infantis-B.ani-B.long-B.bifi (PROBIOTIC 4X) 10-15 mg TbEC Take 1 Tab by mouth Daily (before breakfast).   Yes Historical Provider      No Known Allergies    Patient Vitals for the past 8 hrs:   BP Temp Pulse Resp SpO2 Height Weight   04/24/13 0612 144/68 mmHg 98.6 ??F (37 ??C) 76 16 98 % 5' 3" (1.6 m) 159 lb 13.3 oz (72.5 kg)       Temp (24hrs), Avg:98.6 ??F (37 ??C), Min:98.6 ??F (37 ??C), Max:98.6 ??F (37 ??C)    IMPRESSION:RIGHT breast cancer. Infiltrating ductal carcinoma, grade 2, ER 100%. PR 80%, Ki 20%, her 2 equivocal  PLAN: right lumpectomy and sentinel lymph node biopsy.     Signed By: Mert Dietrick, MD     April 24, 2013

## 2013-04-24 NOTE — Telephone Encounter (Deleted)
Spoke to [facility] @ Dr. Kathrin Penner office.  Ms. Dawn Green canceled her appointment until after she saw you today.  [name] will call her and reschedule.

## 2013-04-24 NOTE — Anesthesia Post-Procedure Evaluation (Signed)
Post-Anesthesia Evaluation and Assessment    Patient: Dawn Green MRN: [id number]  SSN: [id number]    Date of Birth: [birth year]  Age: 72 y.o.  Sex: female       Cardiovascular Function/Vital Signs  Visit Vitals   Item Reading   ??? BP 123/41   ??? Pulse 58   ??? Temp 36.8 ??C (98.3 ??F)   ??? Resp 15   ??? Ht 5\' 3"  (1.6 m)   ??? Wt 72.5 kg (159 lb 13.3 oz)   ??? BMI 28.32 kg/m2   ??? SpO2 99%       Patient is status post General anesthesia for Procedure(s):  RIGHT BREAST LUMPECTOMY,RIGHT BREAST BLUE DYE SENTINEL NODE BIOPSY.    Nausea/Vomiting: None    Postoperative hydration reviewed and adequate.    Pain:  Pain Scale 1: Numeric (0 - 10) (04/24/13 0936)  Pain Intensity 1: 5 (04/24/13 0936)   Managed    Neurological Status:   Neuro (WDL): Within Defined Limits (04/24/13 0843)  Neuro  LUE Motor Response: Purposeful;Spontaneous  (04/24/13 0843)  LLE Motor Response: Purposeful;Spontaneous  (04/24/13 0843)  RUE Motor Response: Purposeful;Spontaneous  (04/24/13 0843)  RLE Motor Response: Rigid;Spontaneous  (04/24/13 0843)   At baseline    Mental Status and Level of Consciousness: Alert and oriented     Pulmonary Status:   O2 Device: Room air (BP cuff inflated) (04/24/13 0904)   Adequate oxygenation and airway patent    Complications related to anesthesia: None    Post-anesthesia assessment completed. No concerns    Signed By: Hayden Rasmussen, MD     April 24, 2013

## 2013-04-24 NOTE — H&P (View-Only) (Signed)
History and Physical    Surgery History and Physical          Dawn Green is a 72 y.o. female who presents for RIGHT lumpectomy.  RIGHT breast mass found on screening ma\\mmogram.  Patient cannot feel a mass. She has no pain, nipple discharge/retraction, edema or discoloration.  Family history: mother diagnosed with ovarian cancer at age 32, deceased at age 44.  Paternal aunt diagnosed with pancreatic cancer at age 74  02/13/13 Mammogram/Ultrasound:   There is a persistent 8 mm nodular focus at the 12:00 position   of the right breast 2.5 cm from the nipple. There is an additional   ill-defined 9.4 mm nodular focus located at the 9:00 position of the right   breast, approximately 6.6 cm from the nipple, that partially compresses with   fat.   Solid mass at the 12:00 position of the right breast, corresponding to the   mammographic finding. Ultrasound-guided biopsy is recommended.   Probable lymph node or fibroglandular island at the 9:00 position of the   right breast, for which a six-month followup mammogram is recommended.   The patient was notified of these results. The patient will be referred to   Dr. Zonia Kief.   BIRADS 4: Suspicious: Biopsy is recommended.   CC: [name], MD   Signing/Reading Doctor: Monico Blitz [phone])   Approved: Monico Blitz [phone]) Feb 13 2013 2:11PM   Review of Systems   Constitutional: Negative.   HENT: Positive for tinnitus.   Eyes:   Glaucoma, glasses,bilateral cataract w/ IOL   Respiratory: Negative.   Cardiovascular: Negative.   Gastrointestinal:   Lactose intolerant   Genitourinary: Positive for frequency.   Musculoskeletal:   RIHGT shoulder pain   Skin: Positive for itching.   Neurological: Negative.   Endo/Heme/Allergies: Negative.   Psychiatric/Behavioral: Negative.   Physical Exam   Constitutional: She is oriented to person, place, and time. She appears well-developed and well-nourished.   Cardiovascular: Normal rate, regular rhythm and normal heart sounds.    Pulmonary/Chest: Effort normal and breath sounds normal. Right breast exhibits no mass, no nipple discharge and no skin change. Left breast exhibits no mass, no nipple discharge and no skin change. Breasts are symmetrical.   Lymphadenopathy:   She has no cervical adenopathy.   She has no axillary adenopathy.   Right: No supraclavicular adenopathy present.   Left: No supraclavicular adenopathy present.   Neurological: She is alert and oriented to person, place, and time.   Skin: Skin is warm and dry.   US - Guided Core Biopsy  Indication : Right Breast mass 12:00 1/3. not palpable.   Ultrasound Findings: 8mm irregular mass 12:00 1/3  Prep : alcohol.   Anesthesia : 1% lidocaine with epinephrine, 5cc.   Device : The suction needle device was advanced through the lesion and captured tissue with real-time Ultrasound Confirmation.   Core Sampling : 4 cores were obtained.   Marker: clip placed   Dressing : Steristrips, gauze and tape.   Instructions : Remove gauze this evening. Remove steristrips in one week.   Tolerance: Pt tolerated procedure with no discomfort  Pathology : Infiltrating ductal carcinoma, grade 2   Concordance: yes   ASSESSMENT and PLAN    ICD-9-CM    1.  Breast mass  611.72    Called patient with pathology report.   Will schedule right lumpectomy and sentinel lymph node biopsy.      Past Medical History   Diagnosis Date   ???  Hypertension    ??? Cancer      bcc chest, nose, lip, right breast cancer   ??? 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 Other ill-defined conditions      glaucoma     Past Surgical History   Procedure Laterality Date   ??? Hx cataract removal Bilateral      w/ IOL implants   ??? Hx tah and bso  1997      Family History   Problem Relation Age of Onset   ??? Cancer Mother 91     ovarian   ??? Cancer Son 54     pancreatic   ??? Cancer Paternal Aunt 24     pancreatic     History   Substance Use Topics   ??? Smoking status: Former Smoker -- 1.00 packs/day for 30 years     Quit date:  04/17/1996   ??? Smokeless tobacco: Never Used   ??? Alcohol Use: 7.0 oz/week     14 drink(s) per week      Comment: 14 gin and tonics/wk      Prior to Admission medications    Medication Sig Start Date End Date Taking? Authorizing Provider   CALCIUM CARBONATE (CALCIUM 500 PO) Take 1 Tab by mouth Daily (before breakfast).   Yes Historical Provider   cholecalciferol (VITAMIN D3) 400 unit tab tablet Take 400 Units by mouth daily.   Yes Historical Provider   brimonidine-timolol (COMBIGAN) 0.2-0.5 % drop ophthalmic solution Administer 1 Drop to both eyes every twelve (12) hours.   Yes Historical Provider   valsartan (DIOVAN) 80 mg tablet Take 80 mg by mouth Daily (before breakfast).   Yes Historical Provider   atorvastatin (LIPITOR) 40 mg tablet Take 40 mg by mouth Daily (before breakfast).   Yes Historical Provider   aspirin 81 mg chewable tablet Take 81 mg by mouth daily.   Yes Historical Provider   Omega-3-DHA-EPA-Fish Oil 1,000 (120-180) mg cap Take 2,400 mg by mouth two (2) times a day.   Yes Historical Provider   multivitamin (ONE A DAY) tablet Take 1 Tab by mouth daily.   Yes Historical Provider   B.infantis-B.ani-B.long-B.bifi (PROBIOTIC 4X) 10-15 mg TbEC Take 1 Tab by mouth Daily (before breakfast).   Yes Historical Provider      No Known Allergies    Patient Vitals for the past 8 hrs:   BP Temp Pulse Resp SpO2 Height Weight   04/24/13 0612 144/68 mmHg 98.6 ??F (37 ??C) 76 16 98 % 5\' 3"  (1.6 m) 159 lb 13.3 oz (72.5 kg)       Temp (24hrs), Avg:98.6 ??F (37 ??C), Min:98.6 ??F (37 ??C), Max:98.6 ??F (37 ??C)    IMPRESSION:RIGHT breast cancer. Infiltrating ductal carcinoma, grade 2, ER 100%. PR 80%, Ki 20%, her 2 equivocal  PLAN: right lumpectomy and sentinel lymph node biopsy.     Signed By: [name], MD     April 24, 2013

## 2013-04-24 NOTE — Op Note (Signed)
Right Breast Lumpectomy with Sentinel Node Biopsy   Operative Report    Date of Surgery: 04/24/2013  Preoperative Diagnosis: Right breast cancer  Postoperative Diagnosis: Right breast cancer  Surgeon: Dr Jong Rickman   Anesthesia: General  Procedure: Right breast lumpectomy with sentinel lymph node biopsy   Indication: RIGHT breast cancer    Procedure:  Dawn Green was brought into the operating room and placed supine on the table.  She was induced with general anesthesia.  5cc of 1/2 strength methylene blue dye was injected in the RIGHT subareolar space and the breast was massage for 5 minutes.  The RIGHT breast and axilla were then prepped and draped in the usual sterile fashion.  0.5% marcaine was used for local anesthesia.  A 3cm incision was made in the lower axilla and the axilla was accessed with blunt dissection.  No blue dye was seen in the axilla.  3 enlarged lymph nodes were removed and sent to pathology.  They were grossly benign.  No other enlarged lymph nodes were palpated.  The breast tumor was in the upper breast 12:00 1cm from the nipple.  It was identified with ultrasound.  A 3cm periareolar incision was made.  Wide excision of the mass was performed using sharp dissection and electrocautery.  The specimen was marked with sutures for orientation purposes and sent to pathology.  Hemostasis was controlled with electrocautery.  Both incisions were closed in 2 layers with 3-0 vicryl for the subcutaneous tissue and 4-0 Monocryl for the skin.  The wounds were dressed with skin glue and the patient was awakened and extubated and taken to the recovery room.  Sponge, needle and instrument counts were reported be correct.     Findings:  Lymph nodes grossly benign.  Estimated Blood Loss: 30cc  Specimens: ID Type Source Tests Collected by Time Destination   1 : RIGHT AXILLARY SENTINEL NODES Frozen Section Lymph Node  Kursten Kruk, MD 04/24/2013 0800 Pathology   2 : RIGHT BREAST LUMPECTOMY Preservative Breast  Matt Delpizzo, MD 04/24/2013 0814 Pathology      Signed:  Letticia Bhattacharyya, MD

## 2013-04-25 MED FILL — LIDOCAINE (PF) 20 MG/ML (2 %) IJ SOLN: 20 mg/mL (2 %) | INTRAMUSCULAR | Qty: 100

## 2013-04-25 MED FILL — GLYCOPYRROLATE 0.2 MG/ML IJ SOLN: 0.2 mg/mL | INTRAMUSCULAR | Qty: 0.1

## 2013-04-25 MED FILL — DIPRIVAN 10 MG/ML INTRAVENOUS EMULSION: 10 mg/mL | INTRAVENOUS | Qty: 170

## 2013-04-25 MED FILL — DEXAMETHASONE SODIUM PHOSPHATE 10 MG/ML IJ SOLN: 10 mg/mL | INTRAMUSCULAR | Qty: 10

## 2013-04-25 MED FILL — ONDANSETRON (PF) 4 MG/2 ML INJECTION: 4 mg/2 mL | INTRAMUSCULAR | Qty: 4

## 2013-04-26 ENCOUNTER — Encounter

## 2013-04-26 NOTE — Telephone Encounter (Signed)
Positive posterior margin  Needs re-excision

## 2013-05-03 NOTE — Other (Signed)
[facility]  PREOPERATIVE INSTRUCTIONS    Surgery Date:   05-08-13  Surgery arrival time given by surgeon: NO   If ???no???,[facility] staff will call you between 4 PM- 8 PM the day before surgery with your arrival time. If your surgery is on a Monday, we will call you the preceding Friday.       Please call [phone] after 8 PM if you did not receive your arrival time.    1. Please report at the designated time to the 2nd Floor Admitting Desk.  Bring your insurance card, photo identification, and any copayment ( if applicable).  2. You must have a responsible adult to drive you home. You need to have a responsible adult to stay with you the first 24 hours after surgery if you are going home the same day of your surgery and you should not drive a car for 24 hours following your surgery.  3. Nothing to eat or drink after midnight the night before surgery. This includes no water, gum, mints, coffee, juice, etc.  Please note special instructions, if applicable, below for medications.  4. MEDICATIONS TO TAKE THE MORNING OF SURGERY WITH A SIP OF WATER: Diovan  5. No alcoholic beverages 24 hours before or after your surgery.  6. If you are being admitted to the hospital,please leave personal belongings/luggage in your car until you have an assigned hospital room number.  7. Stop Aspirin and/or any non-steroidal anti-inflammatory drugs (i.e. Ibuprofen, Naproxen, Advil, Aleve) as directed by your surgeon.  You may take Tylenol.        Stop herbal supplements 1 week prior to  surgery.  8. If you are currently taking Plavix, Coumadin,or any other blood-thinning/anticoagulant medication contact your surgeon for instructions.  9. Please wear comfortable clothes. Wear your glasses instead of contacts. We ask that all money, jewelry and valuables be left at home. Wear no make up, particularly mascara, the day of surgery.   10.  All body piercings, rings,and jewelry need to be removed and left at home.    Please wear your hair loose or down. Please  no pony-tails, buns, or any metal hair accessories. If you shower the morning of surgery, please do not apply any lotions, powders, or deodorants afterwards.   Do not shave any body area within 24 hours of your surgery.  11. Please follow all instructions to avoid any potential surgical cancellation.  12.  Should your physical condition change, (i.e. fever, cold, flu, etc.) please notify your surgeon as soon as possible.  13. It is important to be on time. If a situation occurs where you may be delayed, please call: [phone] on the day of surgery.  14. The Preadmission Testing staff can be reached at [phone]..  15. Special instructions: Free Valet Parking    The spouse was contacted  via phone.   He  verbalize  understanding of all instructions does not  need reinforcement.

## 2013-05-06 NOTE — Progress Notes (Signed)
[facility]  [address], [address]   [place], [place]   [address]  W: [phone]   F: [phone]      NEW HEME/ONC CONSULT      Reason for visit: evaluation for treatment for   Breast Cancer    Consulting physician:  Dr. Dan Humphreys    HPI:   Dawn Green is a 72 y.o.  female who I was asked to see in consultation at the request of Dr. Zonia Kief for evaluation for systemic therapy for breast cancer.  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  Right lumpectomy on 04/24/13 showed 1.5 cm IDC, gr 2, no LVI, HER 2 negative (ratio 1.2, sig/cell 3), 0/3 LN involved, DCIS present with extensive intraductal component.    Was on estrogen patch for 10 years, stopped at diagnosis.      Is very active.    DX   Encounter Diagnoses   Name Primary?   ??? Breast CA Yes   ??? Postmenopausal                 Past Medical History   Diagnosis Date   ??? Hypertension    ??? Cancer      bcc chest, nose, lip, right breast cancer   ??? 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 Other ill-defined conditions      glaucoma   ??? Other ill-defined conditions      mild URI     Past Surgical History   Procedure Laterality Date   ??? Hx tah and bso  1997     hysterectomy   ??? Hx cataract removal Bilateral      w/ IOL implants     History     Social History   ??? Marital Status: MARRIED     Spouse Name: N/A     Number of Children: N/A   ??? Years of Education: N/A     Social History Main Topics   ??? Smoking status: Former Smoker -- 1.00 packs/day for 30 years     Quit date: 04/17/1996   ??? Smokeless tobacco: Never Used   ??? Alcohol Use: 7.0 oz/week     14 drink(s) per week      Comment: 14 gin and tonics/wk   ??? Drug Use: No   ??? Sexually Active: Yes -- Female partner(s)     Birth Control/ Protection: Surgical     Other Topics Concern   ??? Not on file     Social  History Narrative   ??? No narrative on file     Family History   Problem Relation Age of Onset   ??? Cancer Mother 22     ovarian   ??? Cancer Son 93     pancreatic   ??? Cancer Paternal Aunt 7     pancreatic       Current Outpatient Prescriptions   Medication Sig Dispense Refill   ??? brimonidine-timolol (COMBIGAN) 0.2-0.5 % drop ophthalmic solution Administer 1 Drop to both eyes every twelve (12) hours.       ??? valsartan (DIOVAN) 80 mg tablet Take 80 mg by mouth Daily (before breakfast).       ??? atorvastatin (LIPITOR) 40 mg tablet Take  40 mg by mouth Daily (before breakfast).       ??? HYDROcodone-acetaminophen (NORCO) 5-325 mg per tablet Take 1-2 Tabs by mouth every four (4) hours as needed for Pain.  20 Tab  0   ??? CALCIUM CARBONATE (CALCIUM 500 PO) Take 1 Tab by mouth Daily (before breakfast).       ??? cholecalciferol (VITAMIN D3) 400 unit tab tablet Take 400 Units by mouth daily.       ??? aspirin 81 mg chewable tablet Take 81 mg by mouth daily.       ??? Omega-3-DHA-EPA-Fish Oil 1,000 (120-180) mg cap Take 2,400 mg by mouth two (2) times a day.       ??? multivitamin (ONE A DAY) tablet Take 1 Tab by mouth daily.       ??? B.infantis-B.ani-B.long-B.bifi (PROBIOTIC 4X) 10-15 mg TbEC Take 1 Tab by mouth Daily (before breakfast).           No Known Allergies    Review of Systems    A comprehensive review of systems was performed and all systems were negative except for HPI.        Objective:  Physical Exam:  BP 161/63   Pulse 68   Temp(Src) 96.7 ??F (35.9 ??C) (Oral)   Resp 18   Ht 5\' 3"  (1.6 m)   Wt 158 lb (71.668 kg)   BMI 28 kg/m2   SpO2 96%    General: No distress  Respiratory: Normal respiratory effort  CV: No peripheral edema  Skin: No rashes, ecchymoses, or petechiae  Psych: Alert, oriented, normal mood/affect                                                                  Diagnostic Imaging   No results found for this or any previous visit.    No results found for this or any previous visit.    Lab Results  Lab Results    Component Value Date/Time    WBC 6.9 04/17/2013 11:01 AM    HGB 13.9 04/17/2013 11:01 AM    HCT 41.4 04/17/2013 11:01 AM    PLATELET 256 04/17/2013 11:01 AM    MCV 91.8 04/17/2013 11:01 AM       Lab Results   Component Value Date/Time    Sodium 143 04/17/2013 11:01 AM    Potassium 4.3 04/17/2013 11:01 AM    Chloride 104 04/17/2013 11:01 AM    CO2 33 04/17/2013 11:01 AM    Anion gap 6 04/17/2013 11:01 AM    Glucose 98 04/17/2013 11:01 AM    BUN 17 04/17/2013 11:01 AM    Creatinine 0.56 04/17/2013 11:01 AM    BUN/Creatinine ratio 30 04/17/2013 11:01 AM    GFR est AA >60 04/17/2013 11:01 AM    GFR est non-AA >60 04/17/2013 11:01 AM    Calcium 9.6 04/17/2013 11:01 AM       .    Assessment/Plan:  72 y.o. 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  PS 0    1. Breast cancer stage: IA    Hormonal therapy: to be administered    We explained to the patient that the goal of systemic adjuvant therapy is  to improve the chances for cure and decrease the risk of relapse. We explained why a patient can have microscopic cancer spread now even though physical examination, laboratory studies and imaging studies are negative for cancer. We explained that the same treatments used now as adjuvant or preventive treatments rarely if ever are curative in women who develop metastases.     The benefits of systemic therapy at 10 years were estimated for this patient from www.adjuvantonline.com using the clinical data available and the following results were presented to the patient. Endocrine therapies such as ovarian ablation, tamoxifen and/or aromatase inhibitors are only appropriate in hormone receptor positive patents. 2nd generation chemotherapy includes docetaxel and cyclophosphamide every 3 weeks for four cycles. 3rd generation chemotherapy includes ???dose-dense??? chemotherapy with doxorubicin, cyclophosphamide and paclitaxel or ???TAC??? (docetaxel, doxorubicin, and cyclophosphamide).     With no further therapy, her RR is 23%;  endocrine therapy will decrease her RR to 15% and chemotherapy would decrease her RR to 11%.    Using eprognosis.org, her geriatric assessment gives her a 5 year mortality of only 9% and a 10 year mortality of only 34-43%.  Genomic profiling discussion is warranted.    We discussed the potential value of Pam50 (Prosigna) testing. The patient was told that this test uses genetic information from the patient???s own breast cancer tissue to estimate the ten year risk of distant metastases if she takes tamoxifen. The patient was also told that this test is most appropriate in patients with node-negative breast cancer that is estrogen-receptor positive. Emerging data suggest it can also be helpful in women with 1-3 postive nodes. The patient was told that this test can help in decisions concerning the potential benefits of chemotherapy given in addition to hormone therapy like tamoxifen. She was also told that not all insurers reimburse for this test and that we would contact her insurer ahead of time to find out about reimbursement as the cost of the test is about $4,000.    In addition we discussed that this test has been validated in two large independent studies using retrospective tissue samples from over 2,400 patients (TransATAC and ABCSG-8).  The PAM 50 ROR was superior to the Oncotype RS in terms of prediction of late recurrence.    She is agreeable to this and it was ordered.    We discussed that because of her great health that radiation may be offered, but that Dr. Dan Humphreys may also discuss CALGB [phone], which may be relevant if her tumor returns as low risk PAM 50.    The risks and benefits of tamoxifen were discussed in detail and the patient was informed of the following: Risks include a 1% risk of endometrial cancer for postmenopausal women treated for five years but no (or a minimally increased) risk in premenopausal women and that most women who develop tamoxifen-associated endometrial cancer can be cured.  Any bleeding in a postmenopausal woman should be reported to a health care professional. There is also a 1% risk of blood clots (thromboembolism) that can be fatal. All patients irrespective of age who take tamoxifen and who have not had a hysterectomy should have a pelvic exam and Pap smear yearly. Tamoxifen increases the risk of cataract formation and on rare occasions has caused retinal damage: an eye exam is recommended yearly. Other risks include vaginal discharge or dryness, the development or worsening of hot flashes or vasomotor symptoms, and bone loss in premenopausal women. There is excellent evidence that tamoxifen does not increase risk  of depression, cause weight gain or have a major effect on sexual function. Available data suggests little or no effect on cognitive function. Benefits include a lowering of cholesterol and a reduction in the rate of bone loss for postmenopausal woman. Any other symptoms should be reported.    The risks and benefits of aromatase inhibitors (anastrozole, letrozole, and exemestane) were discussed in detail and the patient was informed of the following: Risks include the development of painful muscles and joints (arthralgia/myalgia) and bone loss. Muscle and joint pain can be severe but rarely result in any tissue damage; symptoms usually resolve in several weeks when the medication is stopped. Bone loss is common and a bone density test is recommended as a baseline and then yearly to every several years depending on initial results. The risk of fractures is increased by a few percent in patients taking these drugs, but careful monitoring of bone density and using bone protecting agents when indicated can minimize these risks. Unlike tamoxifen there is no increased risk of blood clots or endometrial cancer. AIs can cause or worsen vaginal dryness but women using these drugs should not use vaginal estrogen preparations for these symptoms. AIs can also cause or increase hot  flashes. Any other symptoms should be reported.    dexa scan ordered.    She is going to have a re-ex on 7/23 due to the DCIS margin and the extensive intraductal component.    We will see her in about 2 weeks to discuss the prosigna test result and to finalize her med onc plan.      Thank you for this consult.  All of the patient's questions were answered today.    > 60 min were spent with this patient with > 50% of that time spent in face to face counseling.          Patient Instructions   Stage I estrogen receptor positive breast cancer    Will order genomic profiling -- prosigna    Return in 2.5 weeks     Follow-up Disposition:  Return in 2 weeks (on 05/22/2013).    Leotis Shames, MD

## 2013-05-06 NOTE — Progress Notes (Signed)
Patient Navigator Encounter   Patient Name: Dawn Green    Medical History: breast cancer    Narrative: Pt here for initial consultation. Pt is here with her husband. Met with pt during consultation to provide emotional support and to address any barriers to care. Briefly described our supportive community resources available to pt (support group, yoga, etc). Reviewed pt's next steps in plan of care and she voiced understanding. Pt reports she has a great support system; family and friends have been providing her practical and emotional support. No concerns, needs at this time. Provided pt with my contact information and encouraged her to call with any questions, concerns. Pt voiced understanding.     Barriers to Care: None identified at this visit.    Plan:   1. Continue to follow to provide ongoing emotional support  2. Assist with barriers to care as they are identified    Thank you.   -Deloris Ping

## 2013-05-06 NOTE — Patient Instructions (Signed)
Stage I estrogen receptor positive breast cancer    Will order genomic profiling -- prosigna    Return in 2.5 weeks

## 2013-05-06 NOTE — Progress Notes (Signed)
Dawn Green is a 72 y.o. female who is being seen as a new patient for breast cancer.

## 2013-05-08 ENCOUNTER — Inpatient Hospital Stay: Payer: MEDICARE

## 2013-05-08 MED ORDER — BUPIVACAINE-EPINEPHRINE (PF) 0.5 %-1:200,000 IJ SOLN
0.5 %-1:200,000 | INTRAMUSCULAR | Status: AC
Start: 2013-05-08 — End: ?

## 2013-05-08 MED ORDER — SODIUM CHLORIDE 0.9 % IJ SYRG
INTRAMUSCULAR | Status: DC | PRN
Start: 2013-05-08 — End: 2013-05-08

## 2013-05-08 MED ORDER — MIDAZOLAM 1 MG/ML IJ SOLN
1 mg/mL | INTRAMUSCULAR | Status: AC
Start: 2013-05-08 — End: ?

## 2013-05-08 MED ORDER — PROPOFOL 10 MG/ML IV EMUL
10 mg/mL | INTRAVENOUS | Status: DC | PRN
Start: 2013-05-08 — End: 2013-05-08
  Administered 2013-05-08 (×3): via INTRAVENOUS

## 2013-05-08 MED ORDER — BUPIVACAINE-EPINEPHRINE (PF) 0.5 %-1:200,000 IJ SOLN
0.5 %-1:200,000 | INTRAMUSCULAR | Status: DC | PRN
Start: 2013-05-08 — End: 2013-05-08
  Administered 2013-05-08: 12:00:00 via SUBCUTANEOUS

## 2013-05-08 MED ORDER — FENTANYL CITRATE (PF) 50 MCG/ML IJ SOLN
50 mcg/mL | INTRAMUSCULAR | Status: AC
Start: 2013-05-08 — End: ?

## 2013-05-08 MED ORDER — PROMETHAZINE 25 MG/ML INJECTION
25 mg/mL | INTRAMUSCULAR | Status: DC | PRN
Start: 2013-05-08 — End: 2013-05-08

## 2013-05-08 MED ORDER — FENTANYL CITRATE (PF) 50 MCG/ML IJ SOLN
50 mcg/mL | INTRAMUSCULAR | Status: DC | PRN
Start: 2013-05-08 — End: 2013-05-08
  Administered 2013-05-08: 12:00:00 via INTRAVENOUS

## 2013-05-08 MED ORDER — LIDOCAINE HCL 2 % (20 MG/ML) IJ SOLN
20 mg/mL (2 %) | INTRAMUSCULAR | Status: DC | PRN
Start: 2013-05-08 — End: 2013-05-08
  Administered 2013-05-08: 12:00:00 via SUBCUTANEOUS

## 2013-05-08 MED ORDER — LIDOCAINE (PF) 10 MG/ML (1 %) IJ SOLN
10 mg/mL (1 %) | INTRAMUSCULAR | Status: DC | PRN
Start: 2013-05-08 — End: 2013-05-08

## 2013-05-08 MED ORDER — LACTATED RINGERS IV
INTRAVENOUS | Status: DC
Start: 2013-05-08 — End: 2013-05-08

## 2013-05-08 MED ORDER — MIDAZOLAM 1 MG/ML IJ SOLN
1 mg/mL | INTRAMUSCULAR | Status: DC | PRN
Start: 2013-05-08 — End: 2013-05-08
  Administered 2013-05-08: 12:00:00 via INTRAVENOUS

## 2013-05-08 MED ORDER — SODIUM CHLORIDE 0.9 % IJ SYRG
Freq: Three times a day (TID) | INTRAMUSCULAR | Status: DC
Start: 2013-05-08 — End: 2013-05-08

## 2013-05-08 MED ORDER — LACTATED RINGERS IV
INTRAVENOUS | Status: DC
Start: 2013-05-08 — End: 2013-05-08
  Administered 2013-05-08: 11:00:00 via INTRAVENOUS

## 2013-05-08 MED ORDER — HYDROMORPHONE (PF) 1 MG/ML IJ SOLN
1 mg/mL | INTRAMUSCULAR | Status: DC | PRN
Start: 2013-05-08 — End: 2013-05-08
  Administered 2013-05-08: 13:00:00 via INTRAVENOUS

## 2013-05-08 MED ORDER — ONDANSETRON (PF) 4 MG/2 ML INJECTION
4 mg/2 mL | INTRAMUSCULAR | Status: DC | PRN
Start: 2013-05-08 — End: 2013-05-08

## 2013-05-08 MED FILL — MIDAZOLAM 1 MG/ML IJ SOLN: 1 mg/mL | INTRAMUSCULAR | Qty: 2

## 2013-05-08 MED FILL — BUPIVACAINE-EPINEPHRINE (PF) 0.5 %-1:200,000 IJ SOLN: 0.5 %-1:200,000 | INTRAMUSCULAR | Qty: 30

## 2013-05-08 MED FILL — FENTANYL CITRATE (PF) 50 MCG/ML IJ SOLN: 50 mcg/mL | INTRAMUSCULAR | Qty: 2

## 2013-05-08 NOTE — Op Note (Signed)
Re-excision lumpectomy  Operative Report    Date of Surgery: 05/08/2013   Preoperative Diagnosis:  right breast cancer  Postoperative Diagnosis: right breast cancer  Surgeon: Dr Zonia Kief  Anesthesia: MAC  Procedure:  Re-excision right lumpectomy site  Indication: tumor at posterior margin  Procedure in Detail:  The patient was sedated with IV sedation.  The right breast was prepped and draped in the usual sterile manner.   The lumpectomy scar was in the upper breast at 12:00.  The scar was re-opened and serous fluid was drained.  The posetrior margin was excised using sharp dissection and electrocautery.  The specimen measured 3cm by 3cm. It was marked with sutures for orientation purposes and sent to Pathology.  Hemostasis was controlled with electrocautery.  Subcutaneous tissues were reapproximated with 3-0 Vicryl suture.  The skin was closed with a running 4-0 Monocryl suture. The wound was dressed with skin glue.  The patient was awakened and taken to the recovery room.    Sponge, needle and instrument counts were reported to be correct.    Findings:  Small seroma  Estimated Blood Loss:  10 ml  Specimens: ID Type Source Tests Collected by Time Destination   1 : right breast posterior margin re-excision lumpectomy Preservative Breast  [name], MD 05/08/2013 0801 Pathology        Signed By: [name], MD

## 2013-05-08 NOTE — Other (Signed)
PACU IN REPORT FROM ANESTHESIA    Verbal report received from [facility]  CRNA  following MAC for Procedure(s) (LRB):  RIGHT BREAST RE-EXCISION LUMPECTOMY (Right).    Note the anesthesia record for medications given intraoperatively.    Brief Initial Visual Assessment:    Airway is:   Patent   Respiratory pattern is:    Even, Non-labored.   Patient is: alert and oriented X 3 (Person, Place and Time.)   Skin is:   Pink, Warm and Dry.   Membranes are:    Pink and Moist.    Patient is in:    No Acute Discomfort. 0 /10 pain using   Verbal Numeric  Scale.    - Note E-MAR for medications administered.    Note assessments documented in flowsheets;any assessment variants to be found in comments or narrative perioperative nurse notes.       Post-anesthesia care now assumed, record signed by Ricci Barker BSN, RN-BC

## 2013-05-08 NOTE — Anesthesia Post-Procedure Evaluation (Signed)
Post-Anesthesia Evaluation and Assessment    Patient: Dawn Green MRN: [id number]  SSN: [id number]    Date of Birth: 1941/03/17  Age: 72 y.o.  Sex: female       Cardiovascular Function/Vital Signs  Visit Vitals   Item Reading   ??? BP 129/64   ??? Pulse 56   ??? Temp 36.3 ??C (97.4 ??F)   ??? Resp 16   ??? SpO2 99%       Patient is status post MAC anesthesia for Procedure(s):  RIGHT BREAST RE-EXCISION LUMPECTOMY.    Nausea/Vomiting: None    Postoperative hydration reviewed and adequate.    Pain:  Pain Scale 1: Numeric (0 - 10) (05/08/13 0854)  Pain Intensity 1: 1 (05/08/13 0854)   Managed    Neurological Status:   Neuro (WDL): Within Defined Limits (05/08/13 0819)   At baseline    Mental Status and Level of Consciousness: Alert and oriented     Pulmonary Status:   O2 Device: Room air (05/08/13 0824)   Adequate oxygenation and airway patent    Complications related to anesthesia: None    Post-anesthesia assessment completed. No concerns    Signed By: Latricia Heft. [facility]     May 08, 2013

## 2013-05-08 NOTE — Anesthesia Pre-Procedure Evaluation (Signed)
Anesthetic History   No history of anesthetic complications           Review of Systems / Medical History  Patient summary reviewed, nursing notes reviewed and pertinent labs reviewed    Pulmonary      Recent URI    Smoker    Comments: Pt has a residual cough from URI which first developed two weeks ago.  Rest of symptoms have resolved.   Neuro/Psych   Within defined limits           Cardiovascular    Hypertension            Exercise tolerance: >4 METS  Comments: Took diovan this am   GI/Hepatic/Renal                  Endo/Other             Other Findings            Physical Exam    Airway  Mallampati: II  TM Distance: 4 - 6 cm  Neck ROM: normal range of motion   Mouth opening: Normal     Cardiovascular  Regular rate and rhythm,  S1 and S2 normal,  no murmur, click, rub, or gallop             Dental      Comments: Multiple chipped teeth   Pulmonary  Breath sounds clear to auscultation            Pertinent negatives: No rhonchi and wheezes   Abdominal  Abdominal exam normal       Other Findings            Anesthetic Plan    ASA: 2  Anesthesia type: MAC          Induction: Intravenous  Anesthetic plan and risks discussed with: Patient

## 2013-05-08 NOTE — Op Note (Signed)
Re-excision lumpectomy  Operative Report    Date of Surgery: 05/08/2013   Preoperative Diagnosis:  right breast cancer  Postoperative Diagnosis: right breast cancer  Surgeon: Dr Jullianna Gabor  Anesthesia: MAC  Procedure:  Re-excision right lumpectomy site  Indication: tumor at posterior margin  Procedure in Detail:  The patient was sedated with IV sedation.  The right breast was prepped and draped in the usual sterile manner.   The lumpectomy scar was in the upper breast at 12:00.  The scar was re-opened and serous fluid was drained.  The posetrior margin was excised using sharp dissection and electrocautery.  The specimen measured 3cm by 3cm. It was marked with sutures for orientation purposes and sent to Pathology.  Hemostasis was controlled with electrocautery.  Subcutaneous tissues were reapproximated with 3-0 Vicryl suture.  The skin was closed with a running 4-0 Monocryl suture. The wound was dressed with skin glue.  The patient was awakened and taken to the recovery room.    Sponge, needle and instrument counts were reported to be correct.    Findings:  Small seroma  Estimated Blood Loss:  10 ml  Specimens: ID Type Source Tests Collected by Time Destination   1 : right breast posterior margin re-excision lumpectomy Preservative Breast  Daquavion Catala, MD 05/08/2013 0801 Pathology        Signed By: Lorilei Horan, MD

## 2013-05-08 NOTE — Interval H&P Note (Signed)
H&P Update:  Dawn Green was seen and examined.  History and physical has been reviewed. There have been no significant clinical changes since the completion of the originally dated History and Physical.    Re-excision posterior margin.    Signed By: [name], MD     May 08, 2013 7:34 AM

## 2013-05-08 NOTE — Progress Notes (Signed)
POST ANESTHESIA CARE DISCHARGE NOTE    Dawn Green was   discharged     via    wheel chair     To private vehicle    .     Patient was escorted by   volunteer.   Patient verbalized   appreciation and was very pleased with care received throughout their stay.  Patient was discharged in   pleasant mood  .   Pain at discharge was     1 /10.    Discharge, medication and follow-up instructions were verbalized as understood prior to discharge.    All personal belongings have been returned to patient, and patient/family verbally confirm receiving belongings as all present.    Ricci Barker BSN RN-BC

## 2013-05-09 MED FILL — DIPRIVAN 10 MG/ML INTRAVENOUS EMULSION: 10 mg/mL | INTRAVENOUS | Qty: 79.03

## 2013-05-09 MED FILL — DIPRIVAN 10 MG/ML INTRAVENOUS EMULSION: 10 mg/mL | INTRAVENOUS | Qty: 70

## 2013-05-09 MED FILL — LIDOCAINE HCL 2 % (20 MG/ML) IJ SOLN: 20 mg/mL (2 %) | INTRAMUSCULAR | Qty: 40

## 2013-05-09 NOTE — Telephone Encounter (Signed)
Breast, right, posterior margin, excision:  Changes consistent with prior biopsy/surgical site.  No evidence for malignancy.    Stage 1.  No residual disease.  Pam50 pending.  We did discuss chemo, but this is premature as hopefully she has a low recurrence risk.

## 2013-05-17 NOTE — Progress Notes (Signed)
Oncotype was ordered as requested per MD. Received call form Mardella Layman in path and was aware that there was not enough cells. I informed MD . I called Mardella Layman back and informed per Md to trey and run what they have. Mardella Layman acknowledged understanding. Lab made aware per [place]. Informed that MD would the results to be rushed.

## 2013-05-20 DIAGNOSIS — R7301 Impaired fasting glucose: Secondary | ICD-10-CM | POA: Diagnosis not present

## 2013-05-20 DIAGNOSIS — Z79899 Other long term (current) drug therapy: Secondary | ICD-10-CM | POA: Diagnosis not present

## 2013-05-20 DIAGNOSIS — E785 Hyperlipidemia, unspecified: Secondary | ICD-10-CM | POA: Diagnosis not present

## 2013-05-20 DIAGNOSIS — E559 Vitamin D deficiency, unspecified: Secondary | ICD-10-CM | POA: Diagnosis not present

## 2013-05-20 NOTE — Telephone Encounter (Signed)
Patient is almost two weeks S/P re-excision lumpectomy.  Complains of redness above incision and some tenderness.  States husband measured the redness on Saturday, and it measured 3x7 cm.  This area has decreased in size, but is still warm to the touch and slightly tender.  She has had no drainage, and has had no fever.  Pain has decreased in intensity.      Also has some problems extending her arm.  Feels a pulling down her arm and into the area of the elbow.  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  Advised stretching was the treatment for this, and she has been doing some stretching and will continue.    Advised that I think it is fine to observe the breast for now since it seems to be getting better, but offered an appointment for tomorrow, which she was not interested in doing.  She will call in the morning if symptoms worsen.  She will be seeing Dr. Elroy Channel on Wednesday, and she thinks that she may have him take a look at it at that time.  Also let her know that Dr. Zonia Kief is in Thursday and Friday, and we can have her seen either one of those days if she does not improve.    She will watch for now and call if things worsen.  She appreciated the call.

## 2013-05-27 DIAGNOSIS — R635 Abnormal weight gain: Secondary | ICD-10-CM | POA: Diagnosis not present

## 2013-05-27 DIAGNOSIS — E785 Hyperlipidemia, unspecified: Secondary | ICD-10-CM | POA: Diagnosis not present

## 2013-05-27 DIAGNOSIS — Z0000 Encounter for general adult medical examination without abnormal findings: Secondary | ICD-10-CM | POA: Diagnosis not present

## 2013-05-27 DIAGNOSIS — Z1212 Encounter for screening for malignant neoplasm of rectum: Secondary | ICD-10-CM | POA: Diagnosis not present

## 2013-05-27 DIAGNOSIS — Z6827 Body mass index (BMI) 27.0-27.9, adult: Secondary | ICD-10-CM | POA: Diagnosis not present

## 2013-05-27 DIAGNOSIS — C439 Malignant melanoma of skin, unspecified: Secondary | ICD-10-CM | POA: Diagnosis not present

## 2013-05-27 DIAGNOSIS — E559 Vitamin D deficiency, unspecified: Secondary | ICD-10-CM | POA: Diagnosis not present

## 2013-05-27 DIAGNOSIS — R7301 Impaired fasting glucose: Secondary | ICD-10-CM | POA: Diagnosis not present

## 2013-05-28 NOTE — Telephone Encounter (Signed)
Spoke to patient and notified her that her oncotype results should be back around 06/03/13 and that we wanted to schedule her for an appointment with Dr. Elroy Channel on 06/04/13.  Patient requests appointment to be on 06/05/13 instead.  Appointment made with Dr. Elroy Channel for 06/05/13 at 10:30 a.m.  Patient verbalizes understanding and has no further questions.

## 2013-05-28 NOTE — Telephone Encounter (Signed)
Called patient to make her an appointment for 06/04/13.  Message left on voice mail for patient to return call.

## 2013-05-28 NOTE — Progress Notes (Signed)
Encounter opened in error.  Patient rescheduled for a later date.

## 2013-05-29 NOTE — Progress Notes (Signed)
Quick Note:    Please let her know this is normal, thanks  ______

## 2013-05-29 NOTE — Progress Notes (Signed)
Quick Note:    Spoke to patient and notified her of bone density results. Patient verbalizes understanding.  ______

## 2013-06-05 MED ORDER — ANASTROZOLE 1 MG TAB
1 mg | ORAL_TABLET | Freq: Every day | ORAL | Status: DC
Start: 2013-06-05 — End: 2014-06-22

## 2013-06-05 NOTE — Progress Notes (Signed)
[facility]  [address], [address]   [place], [place]   [address]  W: [phone]   F: [phone]      f/u HEME/ONC CONSULT      Reason for visit: evaluation for treatment for breast cancer     Consulting physician:  Dr. Dan Humphreys    HPI:   Dawn Green is a 72 y.o.  female who I was asked to see in consultation at the request of Dr. Zonia Kief for evaluation for systemic therapy for breast cancer.  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  Right lumpectomy on 04/24/13 showed 1.5 cm IDC, gr 2, no LVI, HER 2 negative (ratio 1.2, sig/cell 3), 0/3 LN involved, DCIS present with extensive intraductal component.    Was on estrogen patch for 10 years, stopped at diagnosis.      Is very active.    oncotype Dx = 23, intermediate, RR = 15%, ER + at 9.7, PR + at 7.5; HER 2 negative at 8.7    05/08/13 re-ex negative    Interval history:  Complains of gr 1 constipation, gr 1 hot flashes, gr 1 insomnia, gr 1 cough.    DX   Encounter Diagnosis   Name Primary?   ??? Breast CA, right Yes                Past Medical History   Diagnosis Date   ??? Hypertension    ??? Cancer      bcc chest, nose, lip, right breast cancer   ??? 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 Other ill-defined conditions      glaucoma   ??? Other ill-defined conditions      mild URI     Past Surgical History   Procedure Laterality Date   ??? Hx tah and bso  1997     hysterectomy   ??? Hx cataract removal Bilateral      w/ IOL implants   ??? Pr breast surgery procedure unlisted       R breast lumpectomy   ???   05/08/2013     RIGHT BREAST RE-EXCISION LUMPECTOMY performed by [name], MD at [facility] AMBULATORY OR     History     Social History   ??? Marital Status: MARRIED     Spouse Name: N/A     Number of Children: N/A   ??? Years of Education: N/A     Social History  Main Topics   ??? Smoking status: Former Smoker -- 1.00 packs/day for 30 years     Quit date: 04/17/1996   ??? Smokeless tobacco: Never Used   ??? Alcohol Use: 7.0 oz/week     14 drink(s) per week      Comment: 14 gin and tonics/wk   ??? Drug Use: No   ??? Sexually Active: Yes -- Female partner(s)     Birth Control/ Protection: Surgical     Other Topics Concern   ??? Not on file     Social History Narrative   ??? No narrative on file     Family History   Problem Relation Age of Onset   ??? Cancer Mother 81     ovarian   ??? Cancer Son  28     pancreatic   ??? Cancer Paternal Aunt 58     pancreatic       Current Outpatient Prescriptions   Medication Sig Dispense Refill   ??? anastrozole (ARIMIDEX) 1 mg tablet Take 1 tablet by mouth daily.  90 tablet  3   ??? CALCIUM CARBONATE (CALCIUM 500 PO) Take 1 Tab by mouth Daily (before breakfast).       ??? cholecalciferol (VITAMIN D3) 400 unit tab tablet Take 400 Units by mouth daily.       ??? brimonidine-timolol (COMBIGAN) 0.2-0.5 % drop ophthalmic solution Administer 1 Drop to both eyes every twelve (12) hours.       ??? valsartan (DIOVAN) 80 mg tablet Take 80 mg by mouth Daily (before breakfast).       ??? atorvastatin (LIPITOR) 40 mg tablet Take 40 mg by mouth Daily (before breakfast).       ??? aspirin 81 mg chewable tablet Take 81 mg by mouth daily.       ??? Omega-3-DHA-EPA-Fish Oil 1,000 (120-180) mg cap Take 2,400 mg by mouth two (2) times a day.       ??? multivitamin (ONE A DAY) tablet Take 1 Tab by mouth daily.       ??? HYDROcodone-acetaminophen (NORCO) 5-325 mg per tablet Take 1-2 Tabs by mouth every four (4) hours as needed for Pain.  20 Tab  0   ??? B.infantis-B.ani-B.long-B.bifi (PROBIOTIC 4X) 10-15 mg TbEC Take 1 Tab by mouth Daily (before breakfast).           No Known Allergies    Review of Systems    A comprehensive review of systems was performed and all systems were negative except for HPI.        Objective:  Physical Exam:  BP 120/59   Pulse 64   Temp(Src) 97.8 ??F (36.6 ??C) (Oral)   Resp 16   Ht  5\' 3"  (1.6 m)   Wt 159 lb (72.122 kg)   BMI 28.17 kg/m2    General:  Alert, cooperative, no distress, appears stated age.   Head:  Normocephalic, without obvious abnormality, atraumatic.   Eyes:  Conjunctivae/corneas clear. PERRL, EOMs intact.   Throat: Lips, mucosa, and tongue normal.    Neck: Supple, symmetrical, trachea midline, no adenopathy, thyroid: no enlargement/tenderness/nodules   Back:   Symmetric, no curvature. ROM normal. No CVA tenderness.   Lungs:   Clear to auscultation bilaterally.   Chest wall:  No tenderness or deformity.   Heart:  Regular rate and rhythm, S1, S2 normal, no murmur, click, rub or gallop.   Abdomen:   Soft, non-tender. Bowel sounds normal. No masses,  No organomegaly.   Extremities: Extremities normal, atraumatic, no cyanosis or edema.   Skin: Skin color, texture, turgor normal. No rashes or lesions.   Lymph nodes: Cervical, supraclavicular, and axillary nodes normal.   Neurologic: CNII-XII intact.                                                                   Diagnostic Imaging   No results found for this or any previous visit.    No results found for this or any previous visit.    05/22/13 dexa  Lumbar spine: L1-4   Bone  mineral density (gm/cm2): 1.325   % of peak bone mass: 111   % for age matched controls: 131   T score: 1.1   Z score: 2.6   Hip: Right femoral neck   Bone mineral density (gm/cm2): 0.927   % of peak bone mass: 89   % for age matched controls: 84   T score: -0.8   Z score: 0.8   IMPRESSION:   This patient is normal using the World Health Organization criteria   As compared to the prior study, there has been no significant change   10 year probability of major osteoporotic fracture: 8.7%   10 year probability of hip fracture: 0.9%    Lab Results  Lab Results   Component Value Date/Time    WBC 6.9 04/17/2013 11:01 AM    HGB 13.9 04/17/2013 11:01 AM    HCT 41.4 04/17/2013 11:01 AM    PLATELET 256 04/17/2013 11:01 AM    MCV 91.8 04/17/2013 11:01 AM       Lab Results    Component Value Date/Time    Sodium 143 04/17/2013 11:01 AM    Potassium 4.3 04/17/2013 11:01 AM    Chloride 104 04/17/2013 11:01 AM    CO2 33 04/17/2013 11:01 AM    Anion gap 6 04/17/2013 11:01 AM    Glucose 98 04/17/2013 11:01 AM    BUN 17 04/17/2013 11:01 AM    Creatinine 0.56 04/17/2013 11:01 AM    BUN/Creatinine ratio 30 04/17/2013 11:01 AM    GFR est AA >60 04/17/2013 11:01 AM    GFR est non-AA >60 04/17/2013 11:01 AM    Calcium 9.6 04/17/2013 11:01 AM       .    Assessment/Plan:  72 y.o. 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  PS 0    1. Breast cancer stage: IA    Hormonal therapy: administered    We explained to the patient that the goal of systemic adjuvant therapy is to improve the chances for cure and decrease the risk of relapse. We explained why a patient can have microscopic cancer spread now even though physical examination, laboratory studies and imaging studies are negative for cancer. We explained that the same treatments used now as adjuvant or preventive treatments rarely if ever are curative in women who develop metastases.     The benefits of systemic therapy at 10 years were estimated for this patient from www.adjuvantonline.com using the clinical data available and the following results were presented to the patient. Endocrine therapies such as ovarian ablation, tamoxifen and/or aromatase inhibitors are only appropriate in hormone receptor positive patents. 2nd generation chemotherapy includes docetaxel and cyclophosphamide every 3 weeks for four cycles. 3rd generation chemotherapy includes ???dose-dense??? chemotherapy with doxorubicin, cyclophosphamide and paclitaxel or ???TAC??? (docetaxel, doxorubicin, and cyclophosphamide).     With no further therapy, her RR is 23%; endocrine therapy will decrease her RR to 15% and chemotherapy would decrease her RR to 11%.    Using eprognosis.org, her geriatric assessment gives her a 5 year mortality of only 9% and a 10 year  mortality of only 34-43%.  Genomic profiling discussion is warranted.    Her tumor was not sufficient enough for prosigna, so oncotype was ordered.    We discussed the potential value of OncotypeDx testing. The patient was told that this test uses genetic information from the patient???s own breast cancer tissue to estimate the ten year  risk of distant metastases if she takes tamoxifen. The patient was also told that this test is most appropriate in patients with node-negative breast cancer that is estrogen-receptor positive. Emerging data suggest it can also be helpful in women with 1-3 postive nodes. The patient was told that this test can help in decisions concerning the potential benefits of chemotherapy given in addition to hormone therapy like tamoxifen.    In addition we discussed the [employer] trial which uses the OncotypeDx test to determine risk of recurrence. The patients was told that for low risk patients chemotherapy was not recommended, that for patients at intermediate risk there would be a randomization to chemotherapy or not, and that for high-risk patients chemotherapy would be recommended. The trial has now been closed but illustrates the uncertainly associated with chemotherapy in the intermediate group.    Her oncotype is in the intermediate range and her score would have been eligible for that trial.    We discussed the toxicities of TC chemotherapy (docetaxel 75mg /m2/cyclophosphamide 600 mg/m2 q 3 weeks x 4)  in detail. This chemotherapy frequently causes a low white blood cell count and a small percent of patients require hospital admission for treatment of neutropenic fever. We explained that on occasion we consider the use of growth factors to minimize this risk. We explained to the patient that some side effects, if they occur, only last a few days including nausea, vomiting, stomatitis, arthralgia, myalgia, and allergic reactions to Taxotere. We told the  patient that severe nausea and vomiting were very uncommon and that some side effects, if they occur, will last longer: this includes hair loss, which will be seen in all patients treated with these agents and fatigue, which will be seen in most. The patient was also told that if she is having menstrual function that she could develop chemotherapy-induced amenorrhea and infertility. We also told the patient that there was a very small risk of acute leukemia. We also informed the patient that heart damage is rare with these agents    After this discussion, I have true equipoise regarding chemo for her.  Taking into consideration her adjuvant! Risk plus the oncotype, she elects to NOT receive chemotherapy, which I feel is completely reasonable.    The risks and benefits of tamoxifen were discussed in detail and the patient was informed of the following: Risks include a 1% risk of endometrial cancer for postmenopausal women treated for five years but no (or a minimally increased) risk in premenopausal women and that most women who develop tamoxifen-associated endometrial cancer can be cured. Any bleeding in a postmenopausal woman should be reported to a health care professional. There is also a 1% risk of blood clots (thromboembolism) that can be fatal. All patients irrespective of age who take tamoxifen and who have not had a hysterectomy should have a pelvic exam and Pap smear yearly. Tamoxifen increases the risk of cataract formation and on rare occasions has caused retinal damage: an eye exam is recommended yearly. Other risks include vaginal discharge or dryness, the development or worsening of hot flashes or vasomotor symptoms, and bone loss in premenopausal women. There is excellent evidence that tamoxifen does not increase risk of depression, cause weight gain or have a major effect on sexual function. Available data suggests little or no effect on cognitive function. Benefits include a lowering of cholesterol  and a reduction in the rate of bone loss for postmenopausal woman. Any other symptoms should be reported.  The risks and benefits of aromatase inhibitors (anastrozole, letrozole, and exemestane) were discussed in detail and the patient was informed of the following: Risks include the development of painful muscles and joints (arthralgia/myalgia) and bone loss. Muscle and joint pain can be severe but rarely result in any tissue damage; symptoms usually resolve in several weeks when the medication is stopped. Bone loss is common and a bone density test is recommended as a baseline and then yearly to every several years depending on initial results. The risk of fractures is increased by a few percent in patients taking these drugs, but careful monitoring of bone density and using bone protecting agents when indicated can minimize these risks. Unlike tamoxifen there is no increased risk of blood clots or endometrial cancer. AIs can cause or worsen vaginal dryness but women using these drugs should not use vaginal estrogen preparations for these symptoms. AIs can also cause or increase hot flashes. Any other symptoms should be reported.    After this discussion, she will start anastrozole 1 mg daily 1 week after the completion of XRT (she has decided to receive XRT).  Rx in.    Follow-up after early breast cancer was discussed. I recommend follow-up as defined by the American Society of Clinical Oncology and [employer]. This includes a visit to a health care professional every 3-6 months for 3 years, then every 6-12 months for 2 years, and then yearly as well as mammograms yearly.    Thank you for this consult.  All of the patient's questions were answered today.    > 40 min were spent with this patient with > 50% of that time spent in face to face counseling.          Patient Instructions   Start anastrozole 1 week after you finish radiation    Return in 3 months       Follow-up Disposition:   Return in about 3 months (around 09/05/2013).    Leotis Shames, MD

## 2013-06-05 NOTE — Patient Instructions (Signed)
Start anastrozole 1 week after you finish radiation    Return in 3 months

## 2013-06-05 NOTE — Progress Notes (Signed)
Patient is here for a follow up following surgery for breast ca. No pain at this time.

## 2013-07-23 DIAGNOSIS — D485 Neoplasm of uncertain behavior of skin: Secondary | ICD-10-CM | POA: Diagnosis not present

## 2013-07-23 DIAGNOSIS — L820 Inflamed seborrheic keratosis: Secondary | ICD-10-CM | POA: Diagnosis not present

## 2013-07-23 DIAGNOSIS — L821 Other seborrheic keratosis: Secondary | ICD-10-CM | POA: Diagnosis not present

## 2013-07-26 DIAGNOSIS — D485 Neoplasm of uncertain behavior of skin: Secondary | ICD-10-CM | POA: Diagnosis not present

## 2013-08-05 DIAGNOSIS — D485 Neoplasm of uncertain behavior of skin: Secondary | ICD-10-CM | POA: Diagnosis not present

## 2013-09-04 MED ORDER — VENLAFAXINE SR 37.5 MG 24 HR CAP
37.5 mg | ORAL_CAPSULE | Freq: Every day | ORAL | Status: DC
Start: 2013-09-04 — End: 2014-03-25

## 2013-09-04 NOTE — Patient Instructions (Signed)
effexor 37.5 mg daily    Continue anastrozole    Bilateral mammogram in april

## 2013-09-04 NOTE — Progress Notes (Signed)
[name] is a 72 y.o. female here for follow-up of breast cancer.

## 2013-09-04 NOTE — Progress Notes (Signed)
[facility]  [address], [address]   [place], [place]   [address]  W: [phone]   F: [phone]      f/u HEME/ONC CONSULT      Reason for visit: management of breast cancer     Consulting physician:  Dr. Dan Humphreys  Referring physician:  Dr. Zonia Kief    HPI:   Dawn Green is a 72 y.o.  female who I am seeing in f/u for management of breast cancer.  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  Right lumpectomy on 04/24/13 showed 1.5 cm IDC, gr 2, no LVI, HER 2 negative (ratio 1.2, sig/cell 3), 0/3 LN involved, DCIS present with extensive intraductal component.    Was on estrogen patch for 10 years, stopped at diagnosis.      oncotype Dx = 23, intermediate, RR = 15%, ER + at 9.7, PR + at 7.5; HER 2 negative at 8.7    05/08/13 re-ex negative    S/p XRT from 06/19/13-07/10/13    Started anastrozole 07/17/13    Interval history: complains of gr 1 fatigue, gr 2 hot flashes, worse since starting anastrozole and causing gr 1 insomnia, gr 1 cough, gr 1 vaginal dryness.    DX   Encounter Diagnosis   Name Primary?   ??? Breast CA, right Yes                Past Medical History   Diagnosis Date   ??? Hypertension    ??? Cancer      bcc chest, nose, lip, right breast cancer   ??? 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 Other ill-defined conditions      glaucoma   ??? Other ill-defined conditions      mild URI     Past Surgical History   Procedure Laterality Date   ??? Hx tah and bso  1997     hysterectomy   ??? Hx cataract removal Bilateral      w/ IOL implants   ??? Pr breast surgery procedure unlisted       R breast lumpectomy   ???   05/08/2013     RIGHT BREAST RE-EXCISION LUMPECTOMY performed by [name], MD at [facility] AMBULATORY OR     History     Social History   ??? Marital Status: MARRIED     Spouse Name: N/A     Number of  Children: N/A   ??? Years of Education: N/A     Social History Main Topics   ??? Smoking status: Former Smoker -- 1.00 packs/day for 30 years     Quit date: 04/17/1996   ??? Smokeless tobacco: Never Used   ??? Alcohol Use: 7.0 oz/week     14 drink(s) per week      Comment: 14 gin and tonics/wk   ??? Drug Use: No   ??? Sexually Active: Yes -- Female partner(s)     Birth Control/ Protection: Surgical     Other Topics Concern   ??? Not on file     Social History Narrative   ??? No narrative on file     Family History   Problem Relation Age of Onset   ??? Cancer Mother 50  ovarian   ??? Cancer Son 19     pancreatic   ??? Cancer Paternal Aunt 41     pancreatic       Current Outpatient Prescriptions   Medication Sig Dispense Refill   ??? venlafaxine-SR (EFFEXOR-XR) 37.5 mg capsule Take 1 capsule by mouth daily.  30 capsule  6   ??? anastrozole (ARIMIDEX) 1 mg tablet Take 1 tablet by mouth daily.  90 tablet  3   ??? CALCIUM CARBONATE (CALCIUM 500 PO) Take 1 Tab by mouth Daily (before breakfast).       ??? cholecalciferol (VITAMIN D3) 400 unit tab tablet Take 400 Units by mouth daily.       ??? brimonidine-timolol (COMBIGAN) 0.2-0.5 % drop ophthalmic solution Administer 1 Drop to both eyes every twelve (12) hours.       ??? valsartan (DIOVAN) 80 mg tablet Take 80 mg by mouth Daily (before breakfast).       ??? atorvastatin (LIPITOR) 40 mg tablet Take 40 mg by mouth Daily (before breakfast).       ??? aspirin 81 mg chewable tablet Take 81 mg by mouth daily.       ??? Omega-3-DHA-EPA-Fish Oil 1,000 (120-180) mg cap Take 2,400 mg by mouth two (2) times a day.       ??? multivitamin (ONE A DAY) tablet Take 1 Tab by mouth daily.       ??? B.infantis-B.ani-B.long-B.bifi (PROBIOTIC 4X) 10-15 mg TbEC Take 1 Tab by mouth Daily (before breakfast).       ??? HYDROcodone-acetaminophen (NORCO) 5-325 mg per tablet Take 1-2 Tabs by mouth every four (4) hours as needed for Pain.  20 Tab  0       No Known Allergies    Review of Systems    A comprehensive review of systems was  performed and all systems were negative except for HPI and for the symptom report form, reviewed and scanned in.        Objective:  Physical Exam:  BP 174/73   Pulse 57   Temp(Src) 95.8 ??F (35.4 ??C) (Oral)   Resp 16   Ht 5\' 3"  (1.6 m)   Wt 162 lb 6.4 oz (73.664 kg)   BMI 28.78 kg/m2   SpO2 98%    General:  Alert, cooperative, no distress, appears stated age.   Head:  Normocephalic, without obvious abnormality, atraumatic.   Eyes:  Conjunctivae/corneas clear. PERRL, EOMs intact.   Throat: Lips, mucosa, and tongue normal.    Neck: Supple, symmetrical, trachea midline, no adenopathy, thyroid: no enlargement/tenderness/nodules   Back:   Symmetric, no curvature. ROM normal. No CVA tenderness.   Lungs:   Clear to auscultation bilaterally.   Chest wall:  No tenderness or deformity.   Heart:  Regular rate and rhythm, S1, S2 normal, no murmur, click, rub or gallop.   Abdomen:   Soft, non-tender. Bowel sounds normal. No masses,  No organomegaly.   Extremities: Extremities normal, atraumatic, no cyanosis or edema.   Skin: Skin color, texture, turgor normal. No rashes or lesions.   Lymph nodes: Cervical, supraclavicular, and axillary nodes normal.   Neurologic: CNII-XII intact.  Diagnostic Imaging   No results found for this or any previous visit.    No results found for this or any previous visit.    05/22/13 dexa  Lumbar spine: L1-4   Bone mineral density (gm/cm2): 1.325   % of peak bone mass: 111   % for age matched controls: 131   T score: 1.1   Z score: 2.6   Hip: Right femoral neck   Bone mineral density (gm/cm2): 0.927   % of peak bone mass: 89   % for age matched controls: 39   T score: -0.8   Z score: 0.8   IMPRESSION:   This patient is normal using the World Health Organization criteria   As compared to the prior study, there has been no significant change   10 year probability of major osteoporotic fracture: 8.7%   10 year probability of hip fracture:  0.9%    Lab Results  Lab Results   Component Value Date/Time    WBC 6.9 04/17/2013 11:01 AM    HGB 13.9 04/17/2013 11:01 AM    HCT 41.4 04/17/2013 11:01 AM    PLATELET 256 04/17/2013 11:01 AM    MCV 91.8 04/17/2013 11:01 AM       Lab Results   Component Value Date/Time    Sodium 143 04/17/2013 11:01 AM    Potassium 4.3 04/17/2013 11:01 AM    Chloride 104 04/17/2013 11:01 AM    CO2 33 04/17/2013 11:01 AM    Anion gap 6 04/17/2013 11:01 AM    Glucose 98 04/17/2013 11:01 AM    BUN 17 04/17/2013 11:01 AM    Creatinine 0.56 04/17/2013 11:01 AM    BUN/Creatinine ratio 30 04/17/2013 11:01 AM    GFR est AA >60 04/17/2013 11:01 AM    GFR est non-AA >60 04/17/2013 11:01 AM    Calcium 9.6 04/17/2013 11:01 AM       .    Assessment/Plan:  72 y.o. 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  PS 0    1. Breast cancer stage: IA    Hormonal therapy: administered    No evidence of recurrence, continue anastrozole.    Bilateral mammogram ordered for April 2015.  She will see Dr. Zonia Kief in May 2015    2. Hot flashes:  rx effexor 37.5 mg daily and she will try taking the anastrozole at night instead of in the am    3. Insomnia:  Attempting to improve the hot flashes above, she will call if the effexor does not improve things    Thank you for this consult.  All of the patient's questions were answered today.            Patient Instructions   effexor 37.5 mg daily    Continue anastrozole    Bilateral mammogram in april       Follow-up Disposition:  Return in about 3 months (around 12/05/2013).    Leotis Shames, MD

## 2013-09-24 DIAGNOSIS — Z85820 Personal history of malignant melanoma of skin: Secondary | ICD-10-CM | POA: Diagnosis not present

## 2013-09-24 DIAGNOSIS — L570 Actinic keratosis: Secondary | ICD-10-CM | POA: Diagnosis not present

## 2013-11-11 DIAGNOSIS — H6120 Impacted cerumen, unspecified ear: Secondary | ICD-10-CM | POA: Diagnosis not present

## 2013-11-11 DIAGNOSIS — H6690 Otitis media, unspecified, unspecified ear: Secondary | ICD-10-CM | POA: Diagnosis not present

## 2013-11-27 NOTE — Progress Notes (Signed)
Dawn Green is a 73 y.o. female here for follow up of breast cancer.

## 2013-11-27 NOTE — Progress Notes (Signed)
[facility]  [address], [place]   [address]  W: [phone]   F: [phone]      f/u HEME/ONC CONSULT      Reason for visit: management of breast cancer     Consulting physician:  Dr. Gilford Rile  Referring physician:  Dr. Minette Brine    HPI:   Dawn Green is a 73 y.o.  female who I am seeing in f/u for management of breast cancer.  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  Right lumpectomy on 04/24/13 showed 1.5 cm IDC, gr 2, no LVI, HER 2 negative (ratio 1.2, sig/cell 3), 0/3 LN involved, DCIS present with extensive intraductal component.    Was on estrogen patch for 10 years, stopped at diagnosis.      oncotype Dx = 23, intermediate, RR = 15%, ER + at 9.7, PR + at 7.5; HER 2 negative at 8.7    05/08/13 re-ex negative    S/p XRT from 06/19/13-07/10/13    Started anastrozole 07/17/13    Interval history: complains of gr 1 fatigue, gr 2 hot flashes, worse since starting anastrozole and causing gr 1 insomnia, gr 1 cough, gr 1 vaginal dryness.  Gr 2 loss of appetite, but this is due to her cutting back due to elevated blood sugar.    effexor is helpful for hot flashes, but she feels she is sleeping too long    DX   Encounter Diagnosis   Name Primary?   ??? Breast CA, right Yes                Past Medical History   Diagnosis Date   ??? Hypertension    ??? Cancer      bcc chest, nose, lip, right breast cancer   ??? 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 Other ill-defined conditions      glaucoma   ??? Other ill-defined conditions      mild URI     Past Surgical History   Procedure Laterality Date   ??? Hx tah and bso  1997     hysterectomy   ??? Hx cataract removal Bilateral      w/ IOL implants   ??? Pr breast surgery procedure unlisted       R breast lumpectomy   ???   05/08/2013     RIGHT BREAST  RE-EXCISION LUMPECTOMY performed by Trisha Mangle, MD at [place]     History     Social History   ??? Marital Status: MARRIED     Spouse Name: N/A     Number of Children: N/A   ??? Years of Education: N/A     Social History Main Topics   ??? Smoking status: Former Smoker -- 1.00 packs/day for 30 years     Quit date: 04/17/1996   ??? Smokeless tobacco: Never Used   ??? Alcohol Use: 7.0 oz/week     14 drink(s) per week      Comment: 14 gin and tonics/wk   ??? Drug Use: No   ??? Sexual Activity:     Partners: Male     Patent examiner Protection: Surgical     Other Topics Concern   ???  Not on file     Social History Narrative     Family History   Problem Relation Age of Onset   ??? Cancer Mother 25     ovarian   ??? Cancer Son 37     pancreatic   ??? Cancer Paternal Aunt 62     pancreatic       Current Outpatient Prescriptions   Medication Sig Dispense Refill   ??? venlafaxine-SR (EFFEXOR-XR) 37.5 mg capsule Take 1 capsule by mouth daily.  30 capsule  6   ??? anastrozole (ARIMIDEX) 1 mg tablet Take 1 tablet by mouth daily.  90 tablet  3   ??? CALCIUM CARBONATE (CALCIUM 500 PO) Take 1 Tab by mouth Daily (before breakfast).       ??? cholecalciferol (VITAMIN D3) 400 unit tab tablet Take 400 Units by mouth daily.       ??? brimonidine-timolol (COMBIGAN) 0.2-0.5 % drop ophthalmic solution Administer 1 Drop to both eyes every twelve (12) hours.       ??? valsartan (DIOVAN) 80 mg tablet Take 80 mg by mouth Daily (before breakfast).       ??? atorvastatin (LIPITOR) 40 mg tablet Take 40 mg by mouth Daily (before breakfast).       ??? aspirin 81 mg chewable tablet Take 81 mg by mouth daily.       ??? Omega-3-DHA-EPA-Fish Oil 1,000 (120-180) mg cap Take 2,400 mg by mouth two (2) times a day.       ??? multivitamin (ONE A DAY) tablet Take 1 Tab by mouth daily.       ??? B.infantis-B.ani-B.long-B.bifi (PROBIOTIC 4X) 10-15 mg TbEC Take 1 Tab by mouth Daily (before breakfast).       ??? HYDROcodone-acetaminophen (NORCO) 5-325 mg per tablet Take 1-2 Tabs by mouth every  four (4) hours as needed for Pain.  20 Tab  0       No Known Allergies    Review of Systems    A comprehensive review of systems was performed and all systems were negative except for HPI and for the symptom report form, reviewed and scanned in.        Objective:  Physical Exam:  BP 128/59    Pulse 80    Temp(Src) 97.6 ??F (36.4 ??C) (Oral)    Resp 18    Ht $R'5\' 3"'ou$  (1.6 m)    Wt 160 lb (72.576 kg)    BMI 28.35 kg/m2      SpO2 95%     General:  Alert, cooperative, no distress, appears stated age.   Head:  Normocephalic, without obvious abnormality, atraumatic.   Eyes:  Conjunctivae/corneas clear. PERRL, EOMs intact.   Throat: Lips, mucosa, and tongue normal.    Neck: Supple, symmetrical, trachea midline, no adenopathy, thyroid: no enlargement/tenderness/nodules   Back:   Symmetric, no curvature. ROM normal. No CVA tenderness.   Lungs:   Clear to auscultation bilaterally.   Chest wall:  No tenderness or deformity.   Heart:  Regular rate and rhythm, S1, S2 normal, no murmur, click, rub or gallop.   Abdomen:   Soft, non-tender. Bowel sounds normal. No masses,  No organomegaly.   Extremities: Extremities normal, atraumatic, no cyanosis or edema.   Skin: Skin color, texture, turgor normal. No rashes or lesions.   Lymph nodes: Cervical, supraclavicular, and axillary nodes normal.   Neurologic: CNII-XII intact.  Diagnostic Imaging   No results found for this or any previous visit.    No results found for this or any previous visit.    05/22/13 dexa  Lumbar spine: L1-4   Bone mineral density (gm/cm2): 1.325   % of peak bone mass: 111   % for age matched controls: 131   T score: 1.1   Z score: 2.6   Hip: Right femoral neck   Bone mineral density (gm/cm2): 0.927   % of peak bone mass: 89   % for age matched controls: 29   T score: -0.8   Z score: 0.8   IMPRESSION:   This patient is normal using the World Health Organization criteria   As compared to the prior study,  there has been no significant change   10 year probability of major osteoporotic fracture: 8.7%   10 year probability of hip fracture: 0.9%    Lab Results  Lab Results   Component Value Date/Time    WBC 6.9 04/17/2013 11:01 AM    HGB 13.9 04/17/2013 11:01 AM    HCT 41.4 04/17/2013 11:01 AM    PLATELET 256 04/17/2013 11:01 AM    MCV 91.8 04/17/2013 11:01 AM       Lab Results   Component Value Date/Time    Sodium 143 04/17/2013 11:01 AM    Potassium 4.3 04/17/2013 11:01 AM    Chloride 104 04/17/2013 11:01 AM    CO2 33 04/17/2013 11:01 AM    Anion gap 6 04/17/2013 11:01 AM    Glucose 98 04/17/2013 11:01 AM    BUN 17 04/17/2013 11:01 AM    Creatinine 0.56 04/17/2013 11:01 AM    BUN/Creatinine ratio 30 04/17/2013 11:01 AM    GFR est AA >60 04/17/2013 11:01 AM    GFR est non-AA >60 04/17/2013 11:01 AM    Calcium 9.6 04/17/2013 11:01 AM       .    Assessment/Plan:  73 y.o. 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  PS 0    1. Breast cancer stage: IA    Hormonal therapy: administered    No evidence of recurrence, continue anastrozole.    Bilateral mammogram ordered for April 2015.  She will see Dr. Minette Brine in May 2015    Will have her return in 6 months    2. Hot flashes:  effexor 37.5 mg daily has improved, will take in the am and see if that helps with her sleep patterns    3. Insomnia:  Improved with effexor    4. Elevated blood sugar:  Being checked by her pcp.    We today discussed that exercise is important for breast cancer survivors and that the current recommendations are 150 min of moderate exercise weekly or for 75 min of vigorous exercise weekly.  Also, the [place] recommends eating 2.5 cups of fruits and vegetables daily, not smoking, and drinking no more than one glass of alcohol daily for women (and two for men).  This will help for her elevated BS.      Thank you for this consult.  All of the patient's questions were answered today.            Patient Instructions   Continue  anastrozole    Return in 6 months       Follow-up Disposition:  Return in about 6 months (around 05/27/2014).    Joyce Gross,  MD

## 2013-11-27 NOTE — Patient Instructions (Signed)
Continue anastrozole    Return in 6 months

## 2014-02-04 NOTE — Patient Instructions (Addendum)
Please see Dr. Minette Brine next week, we will call with the appointment time.

## 2014-02-04 NOTE — Progress Notes (Signed)
Dawn Green is a 72 y.o. female here for redness of breast.

## 2014-02-04 NOTE — Progress Notes (Signed)
[facility]  [address], [place]   [address]  W: [phone]   F: [phone]      f/u HEME/ONC CONSULT      Reason for visit: management of breast cancer     Consulting physician:  Dr. Gilford Rile  Referring physician:  Dr. Minette Brine    HPI:   Dawn Green is a 73 y.o.  female who I am seeing in f/u for management of breast cancer.  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  Right lumpectomy on 04/24/13 showed 1.5 cm IDC, gr 2, no LVI, HER 2 negative (ratio 1.2, sig/cell 3), 0/3 LN involved, DCIS present with extensive intraductal component.    Was on estrogen patch for 10 years, stopped at diagnosis.      oncotype Dx = 23, intermediate, RR = 15%, ER + at 9.7, PR + at 7.5; HER 2 negative at 8.7    05/08/13 re-ex negative    S/p XRT from 06/19/13-07/10/13    Started anastrozole 07/17/13    Interval history: In today ahead of schedule due to new area of warmth on R breast.  Had bilateral scheduled mammogram today.  Has noticed redness and general tenderness for the past 3 weeks.  Has also noted scant amount of dried discharge on R nipple but no active discharge or bleeding.  Denies fevers/chills.  Today complains of: gr 2 appetite loss, gr 1 constipation, gr 1 fatigue, gr 2 hot flashes, gr 1 insomnia, gr 1 cough, gr 1 itching, gr 1 skin rash (redness).  Continues on anastrozole. Still taking Effexor for hot flashes, now taking it in the AM, not sure it is providing much improvement at this time.     DX   Encounter Diagnosis   Name Primary?   ??? Malignant neoplasm of right breast ([place]) Yes                Past Medical History   Diagnosis Date   ??? Hypertension    ??? Cancer (HCC)      bcc chest, nose, lip, right breast cancer   ??? 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 Other ill-defined  conditions(799.89)      glaucoma   ??? Other ill-defined conditions(799.89)      mild URI     Past Surgical History   Procedure Laterality Date   ??? Hx tah and bso  1997     hysterectomy   ??? Hx cataract removal Bilateral      w/ IOL implants   ??? Pr breast surgery procedure unlisted       R breast lumpectomy   ???   05/08/2013     RIGHT BREAST RE-EXCISION LUMPECTOMY performed by Trisha Mangle, MD at [place]     History     Social History   ??? Marital Status: MARRIED     Spouse Name: N/A     Number of Children: N/A   ??? Years of Education: N/A     Social History Main Topics   ??? Smoking status: Former Smoker -- 1.00 packs/day for 30 years     Quit date: 04/17/1996   ??? Smokeless tobacco: Never Used   ??? Alcohol  Use: 7.0 oz/week     14 drink(s) per week      Comment: 14 gin and tonics/wk   ??? Drug Use: No   ??? Sexual Activity:     Partners: Male     Patent examiner Protection: Surgical     Other Topics Concern   ??? Not on file     Social History Narrative     Family History   Problem Relation Age of Onset   ??? Cancer Mother 24     ovarian   ??? Cancer Son 37     pancreatic   ??? Cancer Paternal Aunt 65     pancreatic       Current Outpatient Prescriptions   Medication Sig Dispense Refill   ??? venlafaxine-SR (EFFEXOR-XR) 37.5 mg capsule Take 1 capsule by mouth daily.  30 capsule  6   ??? anastrozole (ARIMIDEX) 1 mg tablet Take 1 tablet by mouth daily.  90 tablet  3   ??? CALCIUM CARBONATE (CALCIUM 500 PO) Take 1 Tab by mouth Daily (before breakfast).       ??? cholecalciferol (VITAMIN D3) 400 unit tab tablet Take 400 Units by mouth daily.       ??? brimonidine-timolol (COMBIGAN) 0.2-0.5 % drop ophthalmic solution Administer 1 Drop to both eyes every twelve (12) hours.       ??? valsartan (DIOVAN) 80 mg tablet Take 80 mg by mouth Daily (before breakfast).       ??? atorvastatin (LIPITOR) 40 mg tablet Take 40 mg by mouth Daily (before breakfast).       ??? aspirin 81 mg chewable tablet Take 81 mg by mouth daily.       ??? Omega-3-DHA-EPA-Fish Oil  1,000 (120-180) mg cap Take 2,400 mg by mouth two (2) times a day.       ??? multivitamin (ONE A DAY) tablet Take 1 Tab by mouth daily.       ??? B.infantis-B.ani-B.long-B.bifi (PROBIOTIC 4X) 10-15 mg TbEC Take 1 Tab by mouth Daily (before breakfast).       ??? HYDROcodone-acetaminophen (NORCO) 5-325 mg per tablet Take 1-2 Tabs by mouth every four (4) hours as needed for Pain.  20 Tab  0       No Known Allergies    Review of Systems    A comprehensive review of systems was performed and all systems were negative except for HPI and for the symptom report form, reviewed and scanned in.        Objective:  Physical Exam:  BP 152/69   Pulse 71   Temp(Src) 97.7 ??F (36.5 ??C)   Resp 18   Ht '5\' 3"'  (1.6 m)   Wt 156 lb (70.761 kg)   BMI 27.64 kg/m2   SpO2 95%    General:  Alert, cooperative, no distress, appears stated age.   Head:  Normocephalic, without obvious abnormality, atraumatic.   Eyes:  Conjunctivae/corneas clear. PERRL, EOMs intact.   Throat: Lips, mucosa, and tongue normal.    Neck: Supple, symmetrical, trachea midline, no adenopathy, thyroid: no enlargement/tenderness/nodules   Back:   Symmetric, no curvature. ROM normal. No CVA tenderness.   Lungs:   Clear to auscultation bilaterally.   Breast:  R breast with mild erythema to medial surface of breast, post surgical changes present, no masses palpable.  R Breast is diffusely tender to palpation. R nipple with possible dried serous drainage, unable to elicit discharge.   Heart:  Regular rate and rhythm, S1, S2 normal, no murmur, click,  rub or gallop.   Abdomen:   Soft, non-tender. Bowel sounds normal. No masses,  No organomegaly.   Extremities: Extremities normal, atraumatic, no cyanosis or edema.   Skin: Skin color, texture, turgor normal. No rashes or lesions.   Lymph nodes: Cervical, supraclavicular, and axillary nodes normal.   Neurologic: CNII-XII intact.                                                                   Diagnostic Imaging   No results found for this  or any previous visit.    No results found for this or any previous visit.    05/22/13 dexa  Lumbar spine: L1-4   Bone mineral density (gm/cm2): 1.325   % of peak bone mass: 111   % for age matched controls: 131   T score: 1.1   Z score: 2.6   Hip: Right femoral neck   Bone mineral density (gm/cm2): 0.927   % of peak bone mass: 89   % for age matched controls: 21   T score: -0.8   Z score: 0.8   IMPRESSION:   This patient is normal using the World Health Organization criteria   As compared to the prior study, there has been no significant change   10 year probability of major osteoporotic fracture: 8.7%   10 year probability of hip fracture: 0.9%    02/04/14 Bilateral Mammogram  BREAST COMPOSITION: There are scattered fibroglandular densities   (approximately 25-50% glandular).  FINDINGS: Bilateral digital diagnostic mammography was performed, and is   interpreted in conjunction with a computer assisted detection (CAD) system.   Lumpectomy changes are noted in the right breast. Benign type calcifications   are again seen in the left breast. No new masses or suspicious   calcifications are identified.   IMPRESSION:  1. BI-RADS Assessment Category 2: Benign finding. No mammographic evidence   of malignancy.  Recommend followup bilateral diagnostic mammography in one year    Lab Results  Lab Results   Component Value Date/Time    WBC 6.9 04/17/2013 11:01 AM    HGB 13.9 04/17/2013 11:01 AM    HCT 41.4 04/17/2013 11:01 AM    PLATELET 256 04/17/2013 11:01 AM    MCV 91.8 04/17/2013 11:01 AM       Lab Results   Component Value Date/Time    Sodium 143 04/17/2013 11:01 AM    Potassium 4.3 04/17/2013 11:01 AM    Chloride 104 04/17/2013 11:01 AM    CO2 33 04/17/2013 11:01 AM    Anion gap 6 04/17/2013 11:01 AM    Glucose 98 04/17/2013 11:01 AM    BUN 17 04/17/2013 11:01 AM    Creatinine 0.56 04/17/2013 11:01 AM    BUN/Creatinine ratio 30 04/17/2013 11:01 AM    GFR est AA >60 04/17/2013 11:01 AM    GFR est non-AA >60 04/17/2013 11:01 AM    Calcium 9.6 04/17/2013  11:01 AM       .    Assessment/Plan:  74 y.o. 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  PS 0    1. Breast cancer stage: IA    Hormonal therapy: administered    No evidence  of recurrence, continue anastrozole.    Bilateral mammogram completed today without evidence of disease.      In today with a 3 week history of redness to medial surface R breast with diffuse tenderness, very slight warmth to touch.  No palpable mass on exam.  I am reassured that this is not concerning for malignant process however could be due to seroma and possible infection.  She has scheduled follow up with Dr.Stephens in early May, will call their office to move appointment up to next week.  I would appreciate a second exam in her case and if seroma is present would defer to Dr. Minette Brine for management.    Will have her return in August as scheduled.    2. Hot flashes:  Continue to be significant, currently taking Effexor 37.5 mg daily in the AM.  Would not like to increase or change therapy at this time, will monitor.     3. Insomnia:  Improved with effexor        Thank you for this consult.  All of the patient's questions were answered today.            Patient Instructions   Please see Dr. Minette Brine next week, we will call with the appointment time.       Follow-up Disposition:  Return in about 4 months (around 05/28/2014) for follow up [facility] as previously scheduled.    Sonda Rumble MD

## 2014-02-06 NOTE — Telephone Encounter (Signed)
Called patient to advise appointment with Dr. Minette Brine, 02/11/14 at 3:30 pm. No answer, left message regarding appointment information.

## 2014-02-11 NOTE — Progress Notes (Signed)
HISTORY OF PRESENT ILLNESS  Dawn Green is a 73 y.o. female.  HPI  Pt was seen by Dr Laurena Bering this week.  She has a red area on her RIGHT breast which has been present for 3 weeks.  No pain.  She had some crusting on the nipple which has resolved.   Tolerating Anastrazole    03/2013 RIGHT lumpectomy, 1.5cm Infiltrating ductal carcinoma, grade 2, 0/3 nodes, ER 100%. PR 80%, Ki 20%, her 2 neg  Ext beam radiation 06/2013  Anastrazole    Mammogram 02/04/2014.  BIRADS 2    ROS    Physical Exam   Pulmonary/Chest: Right breast exhibits skin change (erythema superior and medial LEFT breast). Right breast exhibits no inverted nipple, no mass, no nipple discharge (no crust on nipple today) and no tenderness. Left breast exhibits no inverted nipple, no mass, no nipple discharge, no skin change and no tenderness. Breasts are asymmetrical (RIGHTbreast smaller than LEFT).       Lymphadenopathy:     She has no cervical adenopathy.     She has no axillary adenopathy.        Right: No supraclavicular adenopathy present.        Left: No supraclavicular adenopathy present.     BREAST ULTRASOUND  Indication: Right  breast erythema  Technique:  The area was scanned using a high-frequency linear-array near-field transducer  Findings: The skin is thick and the underlying tissue is edematous.  No abscess.  No seroma.  No suspicious mass  Impression: cellulitis   Disposition: No intervention  ASSESSMENT and PLAN    ICD-9-CM   1. Cellulitis of breast 611.0     RIGHT breast cellulitis after lumpectomy and radiation.   Cellulitis is common after lumpectomy and radiation and will resolve spontaneously.   No need for antibiotics unless she develops symptoms of infection (fever, pain, drainage)  Yearly mammograms  Follow up with me if any new symptoms arise or the cellulitis does not resolve.

## 2014-02-11 NOTE — Communication Body (Signed)
HISTORY OF PRESENT ILLNESS  Dawn Green is a 73 y.o. female.  HPI  Pt was seen by Dr Laurena Bering this week.  She has a red area on her RIGHT breast which has been present for 3 weeks.  No pain.  She had some crusting on the nipple which has resolved.   Tolerating Anastrazole    03/2013 RIGHT lumpectomy, 1.5cm Infiltrating ductal carcinoma, grade 2, 0/3 nodes, ER 100%. PR 80%, Ki 20%, her 2 neg  Ext beam radiation 06/2013  Anastrazole    Mammogram 02/04/2014.  BIRADS 2    ROS    Physical Exam   Pulmonary/Chest: Right breast exhibits skin change (erythema superior and medial LEFT breast). Right breast exhibits no inverted nipple, no mass, no nipple discharge (no crust on nipple today) and no tenderness. Left breast exhibits no inverted nipple, no mass, no nipple discharge, no skin change and no tenderness. Breasts are asymmetrical (RIGHTbreast smaller than LEFT).       Lymphadenopathy:     She has no cervical adenopathy.     She has no axillary adenopathy.        Right: No supraclavicular adenopathy present.        Left: No supraclavicular adenopathy present.     BREAST ULTRASOUND  Indication: Right  breast erythema  Technique:  The area was scanned using a high-frequency linear-array near-field transducer  Findings: The skin is thick and the underlying tissue is edematous.  No abscess.  No seroma.  No suspicious mass  Impression: cellulitis   Disposition: No intervention  ASSESSMENT and PLAN    ICD-9-CM   1. Cellulitis of breast 611.0     RIGHT breast cellulitis after lumpectomy and radiation.   Cellulitis is common after lumpectomy and radiation and will resolve spontaneously.   No need for antibiotics unless she develops symptoms of infection (fever, pain, drainage)  Yearly mammograms  Follow up with me if any new symptoms arise or the cellulitis does not resolve.

## 2014-03-20 DIAGNOSIS — Z85820 Personal history of malignant melanoma of skin: Secondary | ICD-10-CM | POA: Diagnosis not present

## 2014-03-20 DIAGNOSIS — D485 Neoplasm of uncertain behavior of skin: Secondary | ICD-10-CM | POA: Diagnosis not present

## 2014-03-25 MED ORDER — VENLAFAXINE SR 37.5 MG 24 HR CAP
37.5 mg | ORAL_CAPSULE | ORAL | Status: DC
Start: 2014-03-25 — End: 2014-07-23

## 2014-04-08 DIAGNOSIS — C44611 Basal cell carcinoma of skin of unspecified upper limb, including shoulder: Secondary | ICD-10-CM | POA: Diagnosis not present

## 2014-05-28 MED ORDER — ZOLPIDEM 5 MG TAB
5 mg | ORAL_TABLET | Freq: Every evening | ORAL | Status: DC | PRN
Start: 2014-05-28 — End: 2015-10-02

## 2014-05-28 NOTE — Progress Notes (Signed)
[facility]  [place], [place]   [address]  W: [phone]   F: [phone]      f/u HEME/ONC CONSULT    Reason for visit: management of breast cancer     Consulting physician:  Dr. Gilford Rile  Referring physician:  Dr. Minette Brine    HPI:   Dawn Green is a 73 y.o.  female who I am seeing in f/u for management of breast cancer.  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  Right lumpectomy on 04/24/13 showed 1.5 cm IDC, gr 2, no LVI, HER 2 negative (ratio 1.2, sig/cell 3), 0/3 LN involved, DCIS present with extensive intraductal component.    Was on estrogen patch for 10 years, stopped at diagnosis.      oncotype Dx = 23, intermediate, RR = 15%, ER + at 9.7, PR + at 7.5; HER 2 negative at 8.7    05/08/13 re-ex negative    S/p XRT from 06/19/13-07/10/13    Started anastrozole 07/17/13    Interval history:complains of gr 1 loss of appetite, gr 1 fatigue, gr 2 hot flashes, gr 1 insomnia, gr 1 cough.  Redness on breast has improved.    Insomnia is worse    Normal colonoscopy summer 2015    DX   Encounter Diagnosis   Name Primary?   ??? Malignant neoplasm of right breast ([place]) Yes                Past Medical History   Diagnosis Date   ??? Hypertension    ??? Cancer (HCC)      bcc chest, nose, lip, right breast cancer   ??? 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 Other ill-defined conditions(799.89)      glaucoma   ??? Other ill-defined conditions(799.89)      mild URI     Past Surgical History   Procedure Laterality Date   ??? Hx tah and bso  1997     hysterectomy   ??? Hx cataract removal Bilateral      w/ IOL implants   ??? Pr breast surgery procedure unlisted       R breast lumpectomy   ???   05/08/2013     RIGHT BREAST RE-EXCISION LUMPECTOMY performed by Trisha Mangle, MD at [facility]     History      Social History   ??? Marital Status: MARRIED     Spouse Name: N/A     Number of Children: N/A   ??? Years of Education: N/A     Social History Main Topics   ??? Smoking status: Former Smoker -- 1.00 packs/day for 30 years     Quit date: 04/17/1996   ??? Smokeless tobacco: Never Used   ??? Alcohol Use: 7.0 oz/week     14 drink(s) per week      Comment: 14 gin and tonics/wk   ??? Drug Use: No   ??? Sexual Activity:     Partners: Male     Patent examiner Protection: Surgical     Other Topics Concern   ??? Not on file     Social History Narrative     Family History   Problem Relation  Age of Onset   ??? Cancer Mother 34     ovarian   ??? Cancer Son 6     pancreatic   ??? Cancer Paternal Aunt 102     pancreatic       Current Outpatient Prescriptions   Medication Sig Dispense Refill   ??? venlafaxine-SR (EFFEXOR-XR) 37.5 mg capsule TAKE ONE (1) CAPSULE(S) ONCE DAILY 30 Cap 3   ??? anastrozole (ARIMIDEX) 1 mg tablet Take 1 tablet by mouth daily. 90 tablet 3   ??? CALCIUM CARBONATE (CALCIUM 500 PO) Take 1 Tab by mouth Daily (before breakfast).     ??? cholecalciferol (VITAMIN D3) 400 unit tab tablet Take 400 Units by mouth daily.     ??? brimonidine-timolol (COMBIGAN) 0.2-0.5 % drop ophthalmic solution Administer 1 Drop to both eyes every twelve (12) hours.     ??? valsartan (DIOVAN) 80 mg tablet Take 80 mg by mouth Daily (before breakfast).     ??? atorvastatin (LIPITOR) 40 mg tablet Take 40 mg by mouth Daily (before breakfast).     ??? aspirin 81 mg chewable tablet Take 81 mg by mouth daily.     ??? Omega-3-DHA-EPA-Fish Oil 1,000 (120-180) mg cap Take 2,400 mg by mouth two (2) times a day.     ??? multivitamin (ONE A DAY) tablet Take 1 Tab by mouth daily.     ??? B.infantis-B.ani-B.long-B.bifi (PROBIOTIC 4X) 10-15 mg TbEC Take 1 Tab by mouth Daily (before breakfast).         No Known Allergies    Review of Systems    A comprehensive review of systems was performed and all systems were negative except for HPI and for the symptom report form, reviewed and  scanned in.        Objective:  Physical Exam:  BP 127/63 mmHg   Pulse 67   Temp(Src) 96.6 ??F (35.9 ??C)   Resp 16   Ht '5\' 3"'  (1.6 m)   Wt 157 lb (71.215 kg)   BMI 27.82 kg/m2   SpO2 92%    General:  Alert, cooperative, no distress, appears stated age.   Head:  Normocephalic, without obvious abnormality, atraumatic.   Eyes:  Conjunctivae/corneas clear. PERRL, EOMs intact.   Throat: Lips, mucosa, and tongue normal.    Neck: Supple, symmetrical, trachea midline, no adenopathy, thyroid: no enlargement/tenderness/nodules   Back:   Symmetric, no curvature. ROM normal. No CVA tenderness.   Lungs:   Clear to auscultation bilaterally.       Heart:  Regular rate and rhythm, S1, S2 normal, no murmur, click, rub or gallop.   Abdomen:   Soft, non-tender. Bowel sounds normal. No masses,  No organomegaly.   Extremities: Extremities normal, atraumatic, no cyanosis or edema.   Skin: Skin color, texture, turgor normal. No rashes or lesions.   Lymph nodes: Cervical, supraclavicular, and axillary nodes normal.   Neurologic: CNII-XII intact.                                                                   Diagnostic Imaging   No results found for this or any previous visit.    No results found for this or any previous visit.    05/22/13 dexa  Lumbar spine: L1-4   Bone mineral density (  gm/cm2): 1.325   % of peak bone mass: 111   % for age matched controls: 58   T score: 1.1   Z score: 2.6   Hip: Right femoral neck   Bone mineral density (gm/cm2): 0.927   % of peak bone mass: 89   % for age matched controls: 22   T score: -0.8   Z score: 0.8   IMPRESSION:   This patient is normal using the World Health Organization criteria   As compared to the prior study, there has been no significant change   10 year probability of major osteoporotic fracture: 8.7%   10 year probability of hip fracture: 0.9%    02/04/14 Bilateral Mammogram  BREAST COMPOSITION: There are scattered fibroglandular densities   (approximately 25-50% glandular).   FINDINGS: Bilateral digital diagnostic mammography was performed, and is   interpreted in conjunction with a computer assisted detection (CAD) system.   Lumpectomy changes are noted in the right breast. Benign type calcifications   are again seen in the left breast. No new masses or suspicious   calcifications are identified.   IMPRESSION:  1. BI-RADS Assessment Category 2: Benign finding. No mammographic evidence   of malignancy.  Recommend followup bilateral diagnostic mammography in one year    Lab Results  Lab Results   Component Value Date/Time    WBC 6.9 04/17/2013 11:01 AM    HGB 13.9 04/17/2013 11:01 AM    HCT 41.4 04/17/2013 11:01 AM    PLATELET 256 04/17/2013 11:01 AM    MCV 91.8 04/17/2013 11:01 AM       Lab Results   Component Value Date/Time    SODIUM 143 04/17/2013 11:01 AM    POTASSIUM 4.3 04/17/2013 11:01 AM    CHLORIDE 104 04/17/2013 11:01 AM    CO2 33 04/17/2013 11:01 AM    ANION GAP 6 04/17/2013 11:01 AM    GLUCOSE 98 04/17/2013 11:01 AM    BUN 17 04/17/2013 11:01 AM    CREATININE 0.56 04/17/2013 11:01 AM    BUN/CREATININE RATIO 30 04/17/2013 11:01 AM    GFR EST AA >60 04/17/2013 11:01 AM    GFR EST NON-AA >60 04/17/2013 11:01 AM    CALCIUM 9.6 04/17/2013 11:01 AM       .    Assessment/Plan:  73 y.o. 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  PS 0    1. Breast cancer stage: IA    Hormonal therapy: administered    No evidence of recurrence, continue anastrozole.    2. Hot flashes:  improved, currently taking Effexor 37.5 mg daily in the AM.  Would not like to increase or change therapy at this time, will monitor.     3. Insomnia: worse, will try ambien 5 mg qhs, #30, 3 refills.  Due to anastrozole    Thank you for this consult.  All of the patient's questions were answered today.          Patient Instructions   Continue anastrozole    Return in 6 months       Follow-up Disposition:  Return in about 6 months (around 11/28/2014).    Sonda Rumble MD

## 2014-05-28 NOTE — Progress Notes (Signed)
Dawn Green is a 73 y.o. female here for follow up of breast cancer.

## 2014-05-28 NOTE — Patient Instructions (Signed)
Continue anastrozole    Return in 6 months

## 2014-05-29 DIAGNOSIS — H52209 Unspecified astigmatism, unspecified eye: Secondary | ICD-10-CM | POA: Diagnosis not present

## 2014-05-29 DIAGNOSIS — H259 Unspecified age-related cataract: Secondary | ICD-10-CM | POA: Diagnosis not present

## 2014-05-29 DIAGNOSIS — D3130 Benign neoplasm of unspecified choroid: Secondary | ICD-10-CM | POA: Diagnosis not present

## 2014-05-29 DIAGNOSIS — H524 Presbyopia: Secondary | ICD-10-CM | POA: Diagnosis not present

## 2014-06-16 DIAGNOSIS — F4322 Adjustment disorder with anxiety: Secondary | ICD-10-CM | POA: Diagnosis not present

## 2014-06-17 DIAGNOSIS — E785 Hyperlipidemia, unspecified: Secondary | ICD-10-CM | POA: Diagnosis not present

## 2014-06-17 DIAGNOSIS — Z780 Asymptomatic menopausal state: Secondary | ICD-10-CM | POA: Diagnosis not present

## 2014-06-17 DIAGNOSIS — R7301 Impaired fasting glucose: Secondary | ICD-10-CM | POA: Diagnosis not present

## 2014-06-17 DIAGNOSIS — E559 Vitamin D deficiency, unspecified: Secondary | ICD-10-CM | POA: Diagnosis not present

## 2014-06-24 ENCOUNTER — Other Ambulatory Visit: Payer: Self-pay | Admitting: Internal Medicine

## 2014-06-24 DIAGNOSIS — E559 Vitamin D deficiency, unspecified: Secondary | ICD-10-CM | POA: Diagnosis not present

## 2014-06-24 DIAGNOSIS — R635 Abnormal weight gain: Secondary | ICD-10-CM | POA: Diagnosis not present

## 2014-06-24 DIAGNOSIS — Z0000 Encounter for general adult medical examination without abnormal findings: Secondary | ICD-10-CM | POA: Diagnosis not present

## 2014-06-24 DIAGNOSIS — Z1331 Encounter for screening for depression: Secondary | ICD-10-CM | POA: Diagnosis not present

## 2014-06-24 DIAGNOSIS — R7301 Impaired fasting glucose: Secondary | ICD-10-CM | POA: Diagnosis not present

## 2014-06-24 DIAGNOSIS — Z1231 Encounter for screening mammogram for malignant neoplasm of breast: Secondary | ICD-10-CM

## 2014-06-24 DIAGNOSIS — Z23 Encounter for immunization: Secondary | ICD-10-CM | POA: Diagnosis not present

## 2014-06-24 DIAGNOSIS — E785 Hyperlipidemia, unspecified: Secondary | ICD-10-CM | POA: Diagnosis not present

## 2014-06-24 MED ORDER — ANASTROZOLE 1 MG TAB
1 mg | ORAL_TABLET | ORAL | Status: DC
Start: 2014-06-24 — End: 2015-12-30

## 2014-06-26 ENCOUNTER — Encounter: Payer: Self-pay | Admitting: Internal Medicine

## 2014-06-26 ENCOUNTER — Telehealth: Payer: Self-pay | Admitting: *Deleted

## 2014-06-26 NOTE — Telephone Encounter (Signed)
Jessica Johnston is requesting chart.

## 2014-06-26 NOTE — Telephone Encounter (Signed)
Message copied by Hulan Saas on Thu Jun 26, 2014  8:18 AM ------      Message from: [place] Dragon      Created: Wed Jun 25, 2014 10:49 PM      Regarding: recall colonoscopy       Rollene Fare, I know this lady. She has been trying to call the office to find out if she is due for recall. I need a paper chart, no entry in EMR. I think I did the last colon in 2008 but I am not sure if she  Is a 10 or 5 year recall. Can you, please obtain the chart? Thanx ------

## 2014-06-26 NOTE — Telephone Encounter (Signed)
Chart on Dr. Nichola Sizer desk for review.

## 2014-07-08 ENCOUNTER — Encounter: Payer: Self-pay | Admitting: Gynecology

## 2014-07-08 ENCOUNTER — Ambulatory Visit (INDEPENDENT_AMBULATORY_CARE_PROVIDER_SITE_OTHER): Payer: Medicare Other | Admitting: Gynecology

## 2014-07-08 VITALS — BP 110/70 | Ht 64.5 in | Wt 160.0 lb

## 2014-07-08 DIAGNOSIS — Z8639 Personal history of other endocrine, nutritional and metabolic disease: Secondary | ICD-10-CM | POA: Diagnosis not present

## 2014-07-08 DIAGNOSIS — M8580 Other specified disorders of bone density and structure, unspecified site: Secondary | ICD-10-CM

## 2014-07-08 DIAGNOSIS — N952 Postmenopausal atrophic vaginitis: Secondary | ICD-10-CM | POA: Insufficient documentation

## 2014-07-08 DIAGNOSIS — Z8742 Personal history of other diseases of the female genital tract: Secondary | ICD-10-CM | POA: Insufficient documentation

## 2014-07-08 DIAGNOSIS — M899 Disorder of bone, unspecified: Secondary | ICD-10-CM

## 2014-07-08 DIAGNOSIS — D251 Intramural leiomyoma of uterus: Secondary | ICD-10-CM | POA: Diagnosis not present

## 2014-07-08 DIAGNOSIS — M949 Disorder of cartilage, unspecified: Secondary | ICD-10-CM

## 2014-07-08 MED ORDER — NONFORMULARY OR COMPOUNDED ITEM: Status: DC

## 2014-07-08 NOTE — Patient Instructions (Signed)

## 2014-07-08 NOTE — Progress Notes (Signed)
[name] 1941/04/03 [id number]   History:    73 y.o.  GYN followup and exam which is well over 2 patient has not been seen in the office as 2009. Patient has been traveling across the country and seen numerous physicians. She is now back in [place], [place]. and her PCP is Dr. Elta Guadeloupe Johnston who has been doing her blood work. 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 She is following with a dermatologist every 6 months. Review of her records indicated that back in 2007 she had a simple right ovarian cyst had no followup. She has never been on any hormone replacement therapy. Patient denies any past history of abnormal Pap smears. Her last bone density study was this year. Patient with history of osteopenia on calcium and vitamin D. Patient had a normal colonoscopy in 2008. Her last mammogram was in 2013 she has one scheduled for next week.   Past medical history,surgical history, family history and social history were all reviewed and documented in the EPIC chart.  Gynecologic History No LMP recorded. Patient is postmenopausal. Contraception: post menopausal status Last Pap: Over 5 years ago. Results were: normal Last mammogram: 2013. Results were: normal  Obstetric History OB History  Gravida Para Term Preterm AB SAB TAB Ectopic Multiple Living  3 2   1 1    2     # Outcome Date GA Lbr Len/2nd Weight Sex Delivery Anes PTL Lv  3 SAB           2 PAR           1 PAR                ROS: A ROS was performed and pertinent positives and negatives are included in the history.  GENERAL: No fevers or chills. HEENT: No change in vision, no earache, sore throat or sinus congestion. NECK: No pain or stiffness. CARDIOVASCULAR: No chest pain or pressure. No palpitations. PULMONARY: No shortness of breath, cough or  wheeze. GASTROINTESTINAL: No abdominal pain, nausea, vomiting or diarrhea, melena or bright red blood per rectum. GENITOURINARY: No urinary frequency, urgency, hesitancy or dysuria. MUSCULOSKELETAL: No joint or muscle pain, no back pain, no recent trauma. DERMATOLOGIC: No rash, no itching, no lesions. ENDOCRINE: No polyuria, polydipsia, no heat or cold intolerance. No recent change in weight. HEMATOLOGICAL: No anemia or easy bruising or bleeding. NEUROLOGIC: No headache, seizures, numbness, tingling or weakness. PSYCHIATRIC: No depression, no loss of interest in normal activity or change in sleep pattern.     Exam: chaperone present  BP 110/70  Ht 5' 4.5" (1.638 m)  Wt 160 lb (72.576 kg)  BMI 27.05 kg/m2  Body mass index is 27.05 kg/(m^2).  General appearance : Well developed well nourished female. No acute distress HEENT: Neck supple, trachea midline, no carotid bruits, no thyroidmegaly Lungs: Clear to auscultation, no rhonchi or wheezes, or rib retractions  Heart: Regular rate and rhythm, no murmurs or gallops Breast:Examined in sitting and supine position were symmetrical in appearance, no palpable masses or tenderness,  no skin retraction, no nipple inversion, no nipple discharge, no skin discoloration, no axillary or supraclavicular lymphadenopathy Abdomen: no palpable masses or tenderness, no rebound or guarding Extremities: no edema or skin discoloration or tenderness  Pelvic:  Bartholin, Urethra, Skene Glands: Within normal  limits             Vagina: Severe vaginal atrophy  Cervix: No gross lesions or discharge  Uterus  axial, normal size, shape and consistency, non-tender and mobile  Adnexa  Without masses or tenderness  Anus and perineum  normal   Rectovaginal  normal sphincter tone without palpated masses or tenderness             Hemoccult PCP provides     Assessment/Plan:  73 y.o. female who I have asked to return in one-2 weeks for followup exams and she had some  vaginismus and a complete pelvic exam was not possible. Also patient several years ago had history of a right ovarian cyst. For severe vaginal atrophy patient is going to be started on estradiol 0.02% to apply intravaginally and externally twice a week. Risks benefits and pros and cons were discussed. PCP will be doing her blood work and her vaccinations. Patient was reminded of the importance of calcium vitamin D and regular exercise for osteoporosis prevention. We also discussed importance of monthly breast exams.  Note: This dictation was prepared with  Dragon/digital dictation along withSmart phrase technology. Any transcriptional errors that result from this process are unintentional.   Terrance Mass MD, 11:30 AM 07/08/2014

## 2014-07-09 ENCOUNTER — Ambulatory Visit
Admission: RE | Admit: 2014-07-09 | Discharge: 2014-07-09 | Disposition: A | Discharge status: Home / Self Care | Payer: Medicare Other | Source: Ambulatory Visit | Attending: Internal Medicine | Admitting: Internal Medicine

## 2014-07-09 DIAGNOSIS — Z1231 Encounter for screening mammogram for malignant neoplasm of breast: Secondary | ICD-10-CM

## 2014-07-14 DIAGNOSIS — F4322 Adjustment disorder with anxiety: Secondary | ICD-10-CM | POA: Diagnosis not present

## 2014-07-21 ENCOUNTER — Encounter: Payer: Self-pay | Admitting: Obstetrics and Gynecology

## 2014-07-24 MED ORDER — VENLAFAXINE SR 37.5 MG 24 HR CAP
37.5 mg | ORAL_CAPSULE | ORAL | Status: DC
Start: 2014-07-24 — End: 2014-10-13

## 2014-08-06 ENCOUNTER — Ambulatory Visit (INDEPENDENT_AMBULATORY_CARE_PROVIDER_SITE_OTHER): Payer: Medicare Other | Admitting: Gynecology

## 2014-08-06 ENCOUNTER — Other Ambulatory Visit: Payer: Self-pay | Admitting: Gynecology

## 2014-08-06 ENCOUNTER — Ambulatory Visit (INDEPENDENT_AMBULATORY_CARE_PROVIDER_SITE_OTHER): Payer: Medicare Other

## 2014-08-06 ENCOUNTER — Encounter: Payer: Self-pay | Admitting: Gynecology

## 2014-08-06 DIAGNOSIS — Z8742 Personal history of other diseases of the female genital tract: Secondary | ICD-10-CM

## 2014-08-06 DIAGNOSIS — N83201 Unspecified ovarian cyst, right side: Secondary | ICD-10-CM

## 2014-08-06 DIAGNOSIS — N8320 Unspecified ovarian cysts: Secondary | ICD-10-CM

## 2014-08-06 DIAGNOSIS — D251 Intramural leiomyoma of uterus: Secondary | ICD-10-CM

## 2014-08-06 NOTE — Progress Notes (Signed)
   Patient is a 73 year old who was seen the office for her annual gynecological examination last month. Because of her vaginismus a pelvic exam was not completed adequate Lattie Haw she was asked to return today for an ultrasound. Ultrasound today as follows:  Uterus measures 6.9 x 7.6 x 3.9 cm endometrial stripe of 2.4 mm. 3 intramural fibroids were noted the largest one measuring 3.3 x 2.8 cm. Left ovary located posterior uterine wall difficult to evaluate. Right ovarian thinwall echo-free avascular cyst was documented average size 3.1 cm.  Review of her ultrasound from 2007 had demonstrated she had similar findings but the cyst was on the contralateral ovary which appears to have resolved over the past years. Her fibroids continued to remain stable in size.  Assessment/plan: Right ovarian cyst 3 cm in size. No suspicious features. We're going to obtain a ROMA-1 ovarian cancer screening blood test. She will then return back to the office in 3 months for followup ultrasound arrested test was abnormal. Literature information on ovarian cyst was provided. Stable fibroid uterus.

## 2014-08-06 NOTE — Patient Instructions (Signed)
Ovarian Cyst An ovarian cyst is a fluid-filled sac that forms on an ovary. The ovaries are small organs that produce eggs in women. Various types of cysts can form on the ovaries. Most are not cancerous. Many do not cause problems, and they often go away on their own. Some may cause symptoms and require treatment. Common types of ovarian cysts include:  Functional cysts--These cysts may occur every month during the menstrual cycle. This is normal. The cysts usually go away with the next menstrual cycle if the woman does not get pregnant. Usually, there are no symptoms with a functional cyst.  Endometrioma cysts--These cysts form from the tissue that lines the uterus. They are also called "chocolate cysts" because they become filled with blood that turns brown. This type of cyst can cause pain in the lower abdomen during intercourse and with your menstrual period.  Cystadenoma cysts--This type develops from the cells on the outside of the ovary. These cysts can get very big and cause lower abdomen pain and pain with intercourse. This type of cyst can twist on itself, cut off its blood supply, and cause severe pain. It can also easily rupture and cause a lot of pain.  Dermoid cysts--This type of cyst is sometimes found in both ovaries. These cysts may contain different kinds of body tissue, such as skin, teeth, hair, or cartilage. They usually do not cause symptoms unless they get very big.  Theca lutein cysts--These cysts occur when too much of a certain hormone (human chorionic gonadotropin) is produced and overstimulates the ovaries to produce an egg. This is most common after procedures used to assist with the conception of a baby (in vitro fertilization). CAUSES   Fertility drugs can cause a condition in which multiple large cysts are formed on the ovaries. This is called ovarian hyperstimulation syndrome.  A condition called polycystic ovary syndrome can cause hormonal imbalances that can lead to  nonfunctional ovarian cysts. SIGNS AND SYMPTOMS  Many ovarian cysts do not cause symptoms. If symptoms are present, they may include:  Pelvic pain or pressure.  Pain in the lower abdomen.  Pain during sexual intercourse.  Increasing girth (swelling) of the abdomen.  Abnormal menstrual periods.  Increasing pain with menstrual periods.  Stopping having menstrual periods without being pregnant. DIAGNOSIS  These cysts are commonly found during a routine or annual pelvic exam. Tests may be ordered to find out more about the cyst. These tests may include:  Ultrasound.  X-ray of the pelvis.  CT scan.  MRI.  Blood tests. TREATMENT  Many ovarian cysts go away on their own without treatment. Your health care provider may want to check your cyst regularly for 2-3 months to see if it changes. For women in menopause, it is particularly important to monitor a cyst closely because of the higher rate of ovarian cancer in menopausal women. When treatment is needed, it may include any of the following:  A procedure to drain the cyst (aspiration). This may be done using a long needle and ultrasound. It can also be done through a laparoscopic procedure. This involves using a thin, lighted tube with a tiny camera on the end (laparoscope) inserted through a small incision.  Surgery to remove the whole cyst. This may be done using laparoscopic surgery or an open surgery involving a larger incision in the lower abdomen.  Hormone treatment or birth control pills. These methods are sometimes used to help dissolve a cyst. HOME CARE INSTRUCTIONS   Only take over-the-counter   or prescription medicines as directed by your health care provider.  Follow up with your health care provider as directed.  Get regular pelvic exams and Pap tests. SEEK MEDICAL CARE IF:   Your periods are late, irregular, or painful, or they stop.  Your pelvic pain or abdominal pain does not go away.  Your abdomen becomes  larger or swollen.  You have pressure on your bladder or trouble emptying your bladder completely.  You have pain during sexual intercourse.  You have feelings of fullness, pressure, or discomfort in your stomach.  You lose weight for no apparent reason.  You feel generally ill.  You become constipated.  You lose your appetite.  You develop acne.  You have an increase in body and facial hair.  You are gaining weight, without changing your exercise and eating habits.  You think you are pregnant. SEEK IMMEDIATE MEDICAL CARE IF:   You have increasing abdominal pain.  You feel sick to your stomach (nauseous), and you throw up (vomit).  You develop a fever that comes on suddenly.  You have abdominal pain during a bowel movement.  Your menstrual periods become heavier than usual. MAKE SURE YOU:  Understand these instructions.  Will watch your condition.  Will get help right away if you are not doing well or get worse. Document Released: 10/03/2005 Document Revised: 10/08/2013 Document Reviewed: 06/10/2013 ExitCare Patient Information 2015 [employer]. This information is not intended to replace advice given to you by your health care provider. Make sure you discuss any questions you have with your health care provider.  

## 2014-08-14 LAB — OVARIAN MALIGNANCY RISK-ROMA
CA125: 49 U/mL — AB (ref <35)
HE4: 84 pM (ref <151)
ROMA POSTMENOPAUSAL: 3.47 — AB (ref <2.77)
ROMA PREMENOPAUSAL: 2.3 — AB (ref <1.31)

## 2014-08-15 ENCOUNTER — Telehealth: Payer: Self-pay | Admitting: Gynecology

## 2014-08-15 NOTE — Telephone Encounter (Signed)
Spoke with [name] today in reference to her recent studies as follows:  73 year old patient who had an ultrasound done in the office on October 21 to the fact that time of her annual exam there was quite a lot of vaginal discomfort and vaginismus and the exam was not complete. The ultrasound demonstrated the following:  Uterus measures 6.9 x 7.6 x 3.9 cm endometrial stripe of 2.4 mm. 3 intramural fibroids were noted the largest one measuring 3.3 x 2.8 cm. Left ovary located posterior uterine wall difficult to evaluate. Right ovarian thinwall echo-free avascular cyst was documented average size 3.1 cm.   Review of her records indicated 2007 she had a thin wall right ovarian cyst which measured 3.0 x 3.0 x 2.9 cm. On the last office visit we ordered a  ROMA-1 ovarian cancer screening blood test It was the following lab results were reported:  .Results for Jessica Johnston, Jessica Johnston (MRN [id number]) as of 08/15/2014 14:36  Ref. Range 08/06/2014 12:39  CA125 Latest Range: <35 U/mL 49 (H)  HE4 Latest Range: <151 pM 84  ROMA Postmenopausal Latest Range: <2.77  3.47 (H)  ROMA Premenopausal Latest Range: <1.31  2.30 (H)  I discussed all the above findings with the patient and that perhaps these tumor markers may be falsely elevated because she has fibroids. It appears that over the course of past 8 years her cyst has not changed in size or appearance. I've recommended that she see our GYN oncology colleagues in consultation for the recommendation.

## 2014-08-18 ENCOUNTER — Encounter: Payer: Self-pay | Admitting: Gynecology

## 2014-08-18 ENCOUNTER — Telehealth: Payer: Self-pay | Admitting: *Deleted

## 2014-08-18 NOTE — Telephone Encounter (Signed)
appointment scheduled for 08/25/14 @ 1:45pm with Dr.Clark-Pearson at [facility] regarding elevated tumor markers. Pt informed as well.

## 2014-08-25 ENCOUNTER — Ambulatory Visit: Payer: Medicare Other | Attending: Gynecology | Admitting: Gynecology

## 2014-08-25 VITALS — BP 145/63 | HR 85 | Temp 98.2°F | Resp 16 | Ht 64.5 in | Wt 162.6 lb

## 2014-08-25 DIAGNOSIS — O3481 Maternal care for other abnormalities of pelvic organs, first trimester: Secondary | ICD-10-CM

## 2014-08-25 DIAGNOSIS — N8329 Other ovarian cysts: Secondary | ICD-10-CM | POA: Insufficient documentation

## 2014-08-25 DIAGNOSIS — N83209 Unspecified ovarian cyst, unspecified side: Secondary | ICD-10-CM

## 2014-08-25 DIAGNOSIS — N8320 Unspecified ovarian cysts: Secondary | ICD-10-CM | POA: Diagnosis not present

## 2014-08-25 DIAGNOSIS — D259 Leiomyoma of uterus, unspecified: Secondary | ICD-10-CM | POA: Insufficient documentation

## 2014-08-25 DIAGNOSIS — R978 Other abnormal tumor markers: Secondary | ICD-10-CM | POA: Diagnosis not present

## 2014-08-25 DIAGNOSIS — E780 Pure hypercholesterolemia: Secondary | ICD-10-CM | POA: Insufficient documentation

## 2014-08-25 NOTE — Progress Notes (Signed)
Consult Note: Gyn-Onc   Jessica Johnston 73 y.o. female  Chief Complaint  Patient presents with  . Possible ovarian Cyst    Assessment :long-standing simple ovarian cysts (benign). Uterine fibroids. Elevated tumor marker secondary to fibroids.  Plan: the patient will Return to the care of Dr. Toney Johnston. I would not repeat tumor markers or any further pelvic ultrasounds unless the patient developed new symptoms.   HPI:  73 year old white married female seen in consultation request of Dr. Uvaldo Johnston regarding elevated tumor markers. The patient has a long-standing history of simple ovarian cysts documented overtime with ultrasounds. She recently had a repeat ultrasound showing simple thin-walled ovarian cyst measuring 3.0 x 3.0 x 2.9 cm. Uterine fibroids were also noted. Both are entirely asymptomatic.  The patient had tumor markers obtained revealing a CA-125 of 49 units per mL and a ROMA score which was slightly elevated for a postmenopausal patient.  The patient specifically denies any pelvic pain pressure vaginal bleeding or discharge. She has no GI or GU symptoms. She does not have any family history of breast or ovarian cancer. She does have a past history of melanoma.  Review of Systems:10 point review of systems is negative except as noted in interval history.   Vitals: Blood pressure 145/63, pulse 85, temperature 98.2 F (36.8 C), temperature source Oral, resp. rate 16, height 5' 4.5" (1.638 m), weight 162 lb 9.6 oz (73.755 kg).  Physical Exam: General : The patient is a healthy woman in no acute distress.  HEENT: normocephalic, extraoccular movements normal; neck is supple without thyromegally  Lynphnodes: Supraclavicular and inguinal nodes not enlarged  Abdomen: Soft, non-tender, no ascites, no organomegally, no masses, no hernias  Pelvic:  EGBUS: Normal female  Vagina: Normal, no lesions  Urethra and Bladder: small prolapsed urethra  Cervix: atrophic and normal Uterus:  small anterior  normal shape size and consistency. I cannot palpate the fibroids. Bi-manual examination: Non-tender; no adenxal masses or nodularity  Rectal: normal sphincter tone, no masses, no blood  Lower extremities: No edema or varicosities. Normal range of motion      No Known Allergies  Past Medical History  Diagnosis Date  . High cholesterol   . Cancer     MELANOMA    Past Surgical History  Procedure Laterality Date  . Tonsillectomy and adenoidectomy  1948  . Hysteroscopy      Current Outpatient Prescriptions  Medication Sig Dispense Refill  . aspirin 81 MG tablet Take 81 mg by mouth daily.    . cholecalciferol (VITAMIN D) 1000 UNITS tablet Take 1,000 Units by mouth daily.    . rosuvastatin (CRESTOR) 20 MG tablet Take 20 mg by mouth daily.    . NONFORMULARY OR COMPOUNDED ITEM Estradiol 0.02 % 61ml prefilled applicator Sig: apply twice a week 24 each 4   No current facility-administered medications for this visit.    History   Social History  . Marital Status: Married    Spouse Name: N/A    Number of Children: N/A  . Years of Education: N/A   Occupational History  . Not on file.   Social History Main Topics  . Smoking status: Never Smoker   . Smokeless tobacco: Never Used  . Alcohol Use: No  . Drug Use: Not on file  . Sexual Activity: No   Other Topics Concern  . Not on file   Social History Narrative    No family history on file.    Jessica Chapel, MD 08/25/2014, 1:57 PM

## 2014-08-25 NOTE — Patient Instructions (Signed)
Return to Dr. Toney Rakes for annual exam.

## 2014-09-18 ENCOUNTER — Encounter: Payer: Self-pay | Admitting: Podiatrist

## 2014-09-18 ENCOUNTER — Ambulatory Visit (INDEPENDENT_AMBULATORY_CARE_PROVIDER_SITE_OTHER): Payer: Medicare Other | Admitting: Podiatrist

## 2014-09-18 VITALS — BP 104/64 | HR 78 | Resp 11 | Ht 64.0 in | Wt 160.0 lb

## 2014-09-18 DIAGNOSIS — B351 Tinea unguium: Secondary | ICD-10-CM | POA: Diagnosis not present

## 2014-09-18 DIAGNOSIS — L03032 Cellulitis of left toe: Secondary | ICD-10-CM | POA: Diagnosis not present

## 2014-09-18 MED ORDER — EFINACONAZOLE 10 % EX SOLN: 1.0000 [drp] | Freq: Every day | CUTANEOUS | Status: DC

## 2014-09-18 NOTE — Patient Instructions (Signed)
I am calling you in a medication called Jublia to a pharmacy that will be able to get you the best price on this particular medication-- it is called [facility].  They will call to confirm that you are interested in the topical therapy-- they are located out of [place].  If you do not hear from them within 2-3 days, please let our office know.  You apply the topical to the unpolished nail after it starts to grow out-- apply this medication every day (start once the soreness of the nail procedure subsides-)  ANTIBACTERIAL SOAP INSTRUCTIONS  THE DAY AFTER PROCEDURE   For the first dressing change (soak) Place 3-4 drops of antibacterial liquid soap in a quart of warm tap water.  Submerge foot into water for 20 minutes.  If bandage was applied after your procedure, leave on to allow for easy lift off, then remove and continue with soak for the remaining time.  Next, blot area dry with a soft cloth and apply antibiotic ointment such as polysporin, neosporin, or triple antibiotic ointment.  You may then switch to the instructions below    Shower as usual. Before getting out, place a drop of antibacterial liquid soap (Dial) on a wet, clean washcloth.  Gently wipe washcloth over affected area.  Afterward, rinse the area with warm water.  Blot the area dry with a soft cloth and cover with antibiotic ointment (neosporin, polysporin, bacitracin) and band aid or gauze and tape

## 2014-09-18 NOTE — Progress Notes (Signed)
   Subjective:    Patient ID: Jessica Johnston, female    DOB: 1941/02/11, 73 y.o.   MRN: [id number]  HPI  Jessica Johnston presents today for concern over pain on the left hallux nail and an ingrown lateral nail corner.  She has had a pedicurist cut out the sides for her before however at her most recent visit she had trouble with the toenail after the treatment.  The nail became swollen, red and uncomfortable on the lateral corner.  Her nail is also thick and she would like to consider her options for treatment.     Review of Systems  Musculoskeletal: Positive for back pain.  Hematological: Bruises/bleeds easily.  All other systems reviewed and are negative.      Objective:   Physical Exam  Patient is awake, alert, and oriented x 3.  In no acute distress.  Vascular status is intact with palpable pedal pulses at 2/4 DP and PT bilateral and capillary refill time within normal limits. Neurological sensation is also intact bilaterally via Semmes Weinstein monofilament at 5/5 sites. Light touch, vibratory sensation, Achilles tendon reflex is intact. Dermatological exam reveals skin color, turger and texture as normal. No open lesions present.  Musculature intact with dorsiflexion, plantarflexion, inversion, eversion.   Left hallux nail is ingrown along the lateral border.  It is red, swollen and painful along this border with no sign of drainage noted.  The nail itself is thick, discolored, yellow/brown in discoloration and does appear to be clinically mycotic.        Assessment & Plan:  Ingrown and mycotic left hallux nail  Plan:  Recommended a non permanent removal of the entire nail to allow a new nail to grow out.  Patient agreed and the toe was anesthetized under sterile technique.  The toe was then prepped with betadine, exsanguinated and the nail was gently removed.  Necrotic tissue was then removed.  Antibiotic ointment and a drey sterile compressive dressing was applied.  She was given instructions  for aftercare.  If any redness, swelling or concerns arise she will call.  A Rx for jublia was called into [facility].  For her.

## 2014-09-23 DIAGNOSIS — Z08 Encounter for follow-up examination after completed treatment for malignant neoplasm: Secondary | ICD-10-CM | POA: Diagnosis not present

## 2014-09-23 DIAGNOSIS — Z85828 Personal history of other malignant neoplasm of skin: Secondary | ICD-10-CM | POA: Diagnosis not present

## 2014-09-30 DIAGNOSIS — F4322 Adjustment disorder with anxiety: Secondary | ICD-10-CM | POA: Diagnosis not present

## 2014-10-13 MED ORDER — VENLAFAXINE SR 37.5 MG 24 HR CAP
37.5 mg | ORAL_CAPSULE | Freq: Every day | ORAL | Status: DC
Start: 2014-10-13 — End: 2014-11-26

## 2014-10-13 NOTE — Telephone Encounter (Signed)
Patient called and stated she would like a 90 day supply sent to [facility]. Please call [phone] to let her know if this can be done.

## 2014-10-13 NOTE — Telephone Encounter (Signed)
10/13/2014- Per Dr.Irvin voicemail left for patient informing her that okay to refill Effexor to [facility]. Medication sent electronically- if further questions or concerns needed from patient encouraged her to return phone call.    Requested Prescriptions     Signed Prescriptions Disp Refills   ??? venlafaxine-SR (EFFEXOR-XR) 37.5 mg capsule 90 Cap 3     Sig: Take 1 Cap by mouth daily.     Authorizing Provider: Joyce Gross     Ordering User: Janifer Adie

## 2014-10-20 ENCOUNTER — Other Ambulatory Visit: Payer: Medicare Other

## 2014-10-20 ENCOUNTER — Ambulatory Visit: Payer: Medicare Other | Admitting: Gynecology

## 2014-11-26 ENCOUNTER — Ambulatory Visit: Admit: 2014-11-26 | Discharge: 2014-11-26 | Payer: MEDICARE | Attending: Specialist | Primary: Family Medicine

## 2014-11-26 DIAGNOSIS — C50911 Malignant neoplasm of unspecified site of right female breast: Secondary | ICD-10-CM

## 2014-11-26 MED ORDER — VENLAFAXINE SR 75 MG 24 HR CAP
75 mg | ORAL_CAPSULE | Freq: Every day | ORAL | Status: DC
Start: 2014-11-26 — End: 2015-10-19

## 2014-11-26 NOTE — Progress Notes (Signed)
[facility]  [address] [place]   [address]  W: [phone]   F: [phone]      f/u HEME/ONC CONSULT    Reason for visit: management of breast cancer     Consulting physician:  Dr. Gilford Rile  Referring physician:  Dr. Minette Brine    HPI:   Dawn Green is a 74 y.o.  female who I am seeing in f/u for management of breast cancer.  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  Right lumpectomy on 04/24/13 showed 1.5 cm IDC, gr 2, no LVI, HER 2 negative (ratio 1.2, sig/cell 3), 0/3 LN involved, DCIS present with extensive intraductal component.    Was on estrogen patch for 10 years, stopped at diagnosis.      oncotype Dx = 23, intermediate, RR = 15%, ER + at 9.7, PR + at 7.5; HER 2 negative at 8.7    05/08/13 re-ex negative    S/p XRT from 06/19/13-07/10/13    Started anastrozole 07/17/13    Interval history:complains of gr 2 loss of appetite, gr 1 fatigue, gr 2 hot flashes, gr 2 insomnia, gr 1 cough.  Hot flashes are "all day."    Hot flashes worse    She states her right breast redness is worse.    Complains of worsening gerd    Normal colonoscopy summer 2015    DX   Encounter Diagnosis   Name Primary?   ??? Malignant neoplasm of right female breast, unspecified site of breast ([place]) Yes                Past Medical History   Diagnosis Date   ??? Hypertension    ??? Cancer (HCC)      bcc chest, nose, lip, right breast cancer   ??? 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 Other ill-defined conditions(799.89)      glaucoma   ??? Other ill-defined conditions(799.89)      mild URI     Past Surgical History   Procedure Laterality Date   ??? Hx tah and bso  1997     hysterectomy   ??? Hx cataract removal Bilateral      w/ IOL implants   ??? Pr breast surgery procedure unlisted       R breast lumpectomy    ???   05/08/2013     RIGHT BREAST RE-EXCISION LUMPECTOMY performed by Trisha Mangle, MD at [facility]     History     Social History   ??? Marital Status: MARRIED     Spouse Name: N/A   ??? Number of Children: N/A   ??? Years of Education: N/A     Social History Main Topics   ??? Smoking status: Former Smoker -- 1.00 packs/day for 30 years     Quit date: 04/17/1996   ??? Smokeless tobacco: Never Used   ??? Alcohol Use: 7.0 oz/week     14 drink(s) per week      Comment: 14 gin and tonics/wk   ??? Drug Use: No   ??? Sexual Activity:     Partners: Male     Patent examiner Protection: Surgical     Other Topics Concern   ???  None     Social History Narrative     Family History   Problem Relation Age of Onset   ??? Cancer Mother 55     ovarian   ??? Cancer Son 7     pancreatic   ??? Cancer Paternal Aunt 32     pancreatic       Current Outpatient Prescriptions   Medication Sig Dispense Refill   ??? venlafaxine-SR (EFFEXOR-XR) 75 mg capsule Take 1 Cap by mouth daily. 90 Cap 3   ??? anastrozole (ARIMIDEX) 1 mg tablet TAKE ONE (1) TABLET(S) ONCE DAILY 90 Tab 3   ??? CALCIUM CARBONATE (CALCIUM 500 PO) Take 1 Tab by mouth Daily (before breakfast).     ??? cholecalciferol (VITAMIN D3) 400 unit tab tablet Take 400 Units by mouth daily.     ??? brimonidine-timolol (COMBIGAN) 0.2-0.5 % drop ophthalmic solution Administer 1 Drop to both eyes every twelve (12) hours.     ??? valsartan (DIOVAN) 80 mg tablet Take 80 mg by mouth Daily (before breakfast).     ??? atorvastatin (LIPITOR) 40 mg tablet Take 40 mg by mouth Daily (before breakfast).     ??? aspirin 81 mg chewable tablet Take 81 mg by mouth daily.     ??? Omega-3-DHA-EPA-Fish Oil 1,000 (120-180) mg cap Take 2,400 mg by mouth two (2) times a day.     ??? multivitamin (ONE A DAY) tablet Take 1 Tab by mouth daily.     ??? B.infantis-B.ani-B.long-B.bifi (PROBIOTIC 4X) 10-15 mg TbEC Take 1 Tab by mouth Daily (before breakfast).     ??? zolpidem (AMBIEN) 5 mg tablet Take 1 Tab by mouth nightly as needed for  Sleep. Max Daily Amount: 5 mg. 30 Tab 3       No Known Allergies    Review of Systems    A comprehensive review of systems was performed and all systems were negative except for HPI and for the symptom report form, reviewed and scanned in.        Objective:  Physical Exam:  BP 132/69 mmHg   Pulse 77   Temp(Src) 97.1 ??F (36.2 ??C)   Resp 18   Ht '5\' 3"'  (1.6 m)   Wt 161 lb (73.029 kg)   BMI 28.53 kg/m2   SpO2 94%    General:  Alert, cooperative, no distress, appears stated age.   Head:  Normocephalic, without obvious abnormality, atraumatic.   Eyes:  Conjunctivae/corneas clear. PERRL, EOMs intact.   Throat: Lips, mucosa, and tongue normal.    Neck: Supple, symmetrical, trachea midline, no adenopathy, thyroid: no enlargement/tenderness/nodules   Back:   Symmetric, no curvature. ROM normal. No CVA tenderness.   Lungs:   Clear to auscultation bilaterally.       Heart:  Regular rate and rhythm, S1, S2 normal, no murmur, click, rub or gallop.   Abdomen:   Soft, non-tender. Bowel sounds normal. No masses,  No organomegaly.   Extremities: Extremities normal, atraumatic, no cyanosis or edema.   Skin: Skin color, texture, turgor normal. No rashes or lesions.   Lymph nodes: Cervical, supraclavicular, and axillary nodes normal.   Neurologic: CNII-XII intact.  Breasts:  No masses bilaterally; right breast with radiation skin changes, no erythema of skin  Diagnostic Imaging   No results found for this or any previous visit.    No results found for this or any previous visit.    05/22/13 dexa  Lumbar spine: L1-4   Bone mineral density (gm/cm2): 1.325   % of peak bone mass: 111   % for age matched controls: 131   T score: 1.1   Z score: 2.6   Hip: Right femoral neck   Bone mineral density (gm/cm2): 0.927   % of peak bone mass: 89   % for age matched controls: 25   T score: -0.8   Z score: 0.8   IMPRESSION:    This patient is normal using the World Health Organization criteria   As compared to the prior study, there has been no significant change   10 year probability of major osteoporotic fracture: 8.7%   10 year probability of hip fracture: 0.9%    02/04/14 Bilateral Mammogram  BREAST COMPOSITION: There are scattered fibroglandular densities   (approximately 25-50% glandular).  FINDINGS: Bilateral digital diagnostic mammography was performed, and is   interpreted in conjunction with a computer assisted detection (CAD) system.   Lumpectomy changes are noted in the right breast. Benign type calcifications   are again seen in the left breast. No new masses or suspicious   calcifications are identified.   IMPRESSION:  1. BI-RADS Assessment Category 2: Benign finding. No mammographic evidence   of malignancy.  Recommend followup bilateral diagnostic mammography in one year    Lab Results  Lab Results   Component Value Date/Time    WBC 6.9 04/17/2013 11:01 AM    HGB 13.9 04/17/2013 11:01 AM    HCT 41.4 04/17/2013 11:01 AM    PLATELET 256 04/17/2013 11:01 AM    MCV 91.8 04/17/2013 11:01 AM       Lab Results   Component Value Date/Time    SODIUM 143 04/17/2013 11:01 AM    POTASSIUM 4.3 04/17/2013 11:01 AM    CHLORIDE 104 04/17/2013 11:01 AM    CO2 33 04/17/2013 11:01 AM    ANION GAP 6 04/17/2013 11:01 AM    GLUCOSE 98 04/17/2013 11:01 AM    BUN 17 04/17/2013 11:01 AM    CREATININE 0.56 04/17/2013 11:01 AM    BUN/CREATININE RATIO 30 04/17/2013 11:01 AM    GFR EST AA >60 04/17/2013 11:01 AM    GFR EST NON-AA >60 04/17/2013 11:01 AM    CALCIUM 9.6 04/17/2013 11:01 AM       .    Assessment/Plan:  74 y.o. 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  PS 0    1. Breast cancer stage: IA    Hormonal therapy: administered    No evidence of recurrence, continue anastrozole.    Ordered mammogram for april    2. Hot flashes:  worse, currently taking Effexor 37.5 mg daily in the AM.   Will increase to 75 mg daily    3. Insomnia: stable, decided to not try ambien    4. Breast redness:  Due to radiation changes, no masses, no peau d' orange    5. Gerd:  otc prilosec    Thank you for this consult.  All of the patient's questions were answered today.          Patient Instructions   Raechel Ache for gyn       Follow-up Disposition:  Return in about 6 months (around 05/27/2015).  Sonda Rumble MD

## 2014-11-26 NOTE — Progress Notes (Signed)
Dawn Green is a 74 y.o. female here for follow up of breast cancer.

## 2014-11-26 NOTE — Patient Instructions (Addendum)
Raechel Ache for gyn    Continue anastrozole    Increased effexor to 75 mg daily

## 2015-01-12 DIAGNOSIS — M79671 Pain in right foot: Secondary | ICD-10-CM | POA: Diagnosis not present

## 2015-01-12 DIAGNOSIS — M722 Plantar fascial fibromatosis: Secondary | ICD-10-CM | POA: Diagnosis not present

## 2015-01-19 DIAGNOSIS — M79671 Pain in right foot: Secondary | ICD-10-CM | POA: Diagnosis not present

## 2015-01-19 DIAGNOSIS — M722 Plantar fascial fibromatosis: Secondary | ICD-10-CM | POA: Diagnosis not present

## 2015-01-26 DIAGNOSIS — M722 Plantar fascial fibromatosis: Secondary | ICD-10-CM | POA: Diagnosis not present

## 2015-01-26 DIAGNOSIS — M79671 Pain in right foot: Secondary | ICD-10-CM | POA: Diagnosis not present

## 2015-01-29 DIAGNOSIS — M79671 Pain in right foot: Secondary | ICD-10-CM | POA: Diagnosis not present

## 2015-01-29 DIAGNOSIS — M722 Plantar fascial fibromatosis: Secondary | ICD-10-CM | POA: Diagnosis not present

## 2015-01-31 ENCOUNTER — Emergency Department (INDEPENDENT_AMBULATORY_CARE_PROVIDER_SITE_OTHER): Payer: Medicare Other

## 2015-01-31 ENCOUNTER — Emergency Department (INDEPENDENT_AMBULATORY_CARE_PROVIDER_SITE_OTHER)
Admission: EM | Admit: 2015-01-31 | Discharge: 2015-01-31 | Disposition: A | Discharge status: Home / Self Care | Payer: Medicare Other | Source: Home / Self Care | Attending: Emergency Medicine | Admitting: Emergency Medicine

## 2015-01-31 ENCOUNTER — Emergency Department (HOSPITAL_COMMUNITY)
Admission: EM | Admit: 2015-01-31 | Discharge: 2015-01-31 | Discharge status: Absent without leave | Payer: Self-pay | Source: Home / Self Care

## 2015-01-31 ENCOUNTER — Encounter (HOSPITAL_COMMUNITY): Payer: Self-pay

## 2015-01-31 DIAGNOSIS — S52611A Displaced fracture of right ulna styloid process, initial encounter for closed fracture: Secondary | ICD-10-CM | POA: Diagnosis not present

## 2015-01-31 DIAGNOSIS — S52511A Displaced fracture of right radial styloid process, initial encounter for closed fracture: Secondary | ICD-10-CM | POA: Diagnosis not present

## 2015-01-31 DIAGNOSIS — S5291XA Unspecified fracture of right forearm, initial encounter for closed fracture: Secondary | ICD-10-CM | POA: Diagnosis not present

## 2015-01-31 MED ORDER — TRAMADOL HCL 50 MG PO TABS: 50.0000 mg | ORAL_TABLET | Freq: Four times a day (QID) | ORAL | Status: DC | PRN

## 2015-01-31 NOTE — ED Notes (Signed)
Pain inright wrist after she fell earlier today. Deformity noted distal forarm

## 2015-01-31 NOTE — Discharge Instructions (Signed)
You broke your wrist. Keep the splint on until you see Dr. Veverly Fells later this week. You can call his office Monday morning for an appointment. Apply ice 3 times a day. Take Tylenol or ibuprofen as needed for pain. Use the tramadol as needed for severe pain.

## 2015-01-31 NOTE — ED Provider Notes (Signed)
CSN: [id number]     Arrival date & time 01/31/15  1643 History   First MD Initiated Contact with Patient 01/31/15 1741     Chief Complaint  Patient presents with  . Fall   (Consider location/radiation/quality/duration/timing/severity/associated sxs/prior Treatment) HPI She is a 74 year old woman here for evaluation of right wrist pain. She states she is working on her terrace this afternoon when she tripped and fell. She thinks she landed on her right hand. She has pain and swelling in the wrist. She is able to move all of her fingers, although with pain. She has taken some Advil which helped a little bit with pain.  Past Medical History  Diagnosis Date  . High cholesterol   . Cancer     MELANOMA   Past Surgical History  Procedure Laterality Date  . Tonsillectomy and adenoidectomy  1948  . Hysteroscopy     History reviewed. No pertinent family history. History  Substance Use Topics  . Smoking status: Never Smoker   . Smokeless tobacco: Never Used  . Alcohol Use: No   OB History    Gravida Para Term Preterm AB TAB SAB Ectopic Multiple Living   3 2   1  1   2      Review of Systems As in history of present illness Allergies  Review of patient's allergies indicates no known allergies.  Home Medications   Prior to Admission medications   Medication Sig Start Date End Date Taking? Authorizing Provider  aspirin 81 MG tablet Take 81 mg by mouth daily.    Historical Provider, MD  cholecalciferol (VITAMIN D) 1000 UNITS tablet Take 1,000 Units by mouth daily.    Historical Provider, MD  Efinaconazole 10 % SOLN Apply 1 drop topically daily. 09/18/14   Bronson Ing, DPM  NONFORMULARY OR COMPOUNDED ITEM Estradiol 0.02 % 17ml prefilled applicator Sig: apply twice a week 07/08/14   Terrance Mass, MD  rosuvastatin (CRESTOR) 20 MG tablet Take 20 mg by mouth daily.    Historical Provider, MD  traMADol (ULTRAM) 50 MG tablet Take 1 tablet (50 mg total) by mouth every 6 (six) hours  as needed. 01/31/15   Melony Overly, MD   BP 153/50 mmHg  Pulse 68  Temp(Src) 98.7 F (37.1 C) (Oral)  Resp 16  SpO2 98% Physical Exam  Constitutional: She is oriented to person, place, and time. She appears well-developed and well-nourished. No distress.  Cardiovascular: Normal rate.   Pulmonary/Chest: Effort normal.  Musculoskeletal:  Right wrist: Bruising and edema noted in the wrist and dorsal hand, extends into the fingers. She has a 2+ radial pulse. She has somewhat delayed cap refill in her fingers, but her hand has been on ice. She is tender over the distal radius.  Neurological: She is alert and oriented to person, place, and time.    ED Course  Procedures (including critical care time) Labs Review Labs Reviewed - No data to display  Imaging Review Dg Wrist Complete Right  01/31/2015   CLINICAL DATA:  Right wrist pain following a fall today.  EXAM: RIGHT WRIST - COMPLETE 3+ VIEW  COMPARISON:  None.  FINDINGS: Nondisplaced distal radial metaphysis and epiphysis fracture extending into the radiocarpal joint. This has a longitudinal component as well as a probable transverse component. There is also a fracture of the distal aspect of the ulnar styloid with mild radial displacement of the distal fragment. There is also a triangular-shaped dorsally displaced distal radius fragment.  IMPRESSION: Distal radius  and ulnar styloid fractures, as described above.   Electronically Signed   By: Claudie Revering M.D.   On: 01/31/2015 18:13     MDM   1. Right radial fracture, closed, initial encounter    Discussed with Dr. Veverly Fells in orthopedic surgery. Sugar tong splint placed. Tylenol or Motrin for pain. Recommended icing. Prescription for tramadol to use as needed for severe pain. Follow-up with Dr. Veverly Fells later this week.    Melony Overly, MD 01/31/15 1900

## 2015-02-02 DIAGNOSIS — M722 Plantar fascial fibromatosis: Secondary | ICD-10-CM | POA: Diagnosis not present

## 2015-02-02 DIAGNOSIS — M79671 Pain in right foot: Secondary | ICD-10-CM | POA: Diagnosis not present

## 2015-02-05 DIAGNOSIS — S62101A Fracture of unspecified carpal bone, right wrist, initial encounter for closed fracture: Secondary | ICD-10-CM | POA: Diagnosis not present

## 2015-02-05 DIAGNOSIS — M722 Plantar fascial fibromatosis: Secondary | ICD-10-CM | POA: Diagnosis not present

## 2015-02-05 DIAGNOSIS — M79671 Pain in right foot: Secondary | ICD-10-CM | POA: Diagnosis not present

## 2015-02-06 ENCOUNTER — Inpatient Hospital Stay: Admit: 2015-02-06 | Payer: MEDICARE | Attending: Specialist | Primary: Family Medicine

## 2015-02-06 DIAGNOSIS — C50911 Malignant neoplasm of unspecified site of right female breast: Secondary | ICD-10-CM

## 2015-02-09 DIAGNOSIS — M722 Plantar fascial fibromatosis: Secondary | ICD-10-CM | POA: Diagnosis not present

## 2015-02-09 DIAGNOSIS — M79671 Pain in right foot: Secondary | ICD-10-CM | POA: Diagnosis not present

## 2015-02-12 DIAGNOSIS — S62101D Fracture of unspecified carpal bone, right wrist, subsequent encounter for fracture with routine healing: Secondary | ICD-10-CM | POA: Diagnosis not present

## 2015-02-16 DIAGNOSIS — M79671 Pain in right foot: Secondary | ICD-10-CM | POA: Diagnosis not present

## 2015-02-16 DIAGNOSIS — M722 Plantar fascial fibromatosis: Secondary | ICD-10-CM | POA: Diagnosis not present

## 2015-02-19 DIAGNOSIS — S62101D Fracture of unspecified carpal bone, right wrist, subsequent encounter for fracture with routine healing: Secondary | ICD-10-CM | POA: Diagnosis not present

## 2015-02-24 DIAGNOSIS — M79671 Pain in right foot: Secondary | ICD-10-CM | POA: Diagnosis not present

## 2015-02-24 DIAGNOSIS — M722 Plantar fascial fibromatosis: Secondary | ICD-10-CM | POA: Diagnosis not present

## 2015-02-26 DIAGNOSIS — S62101D Fracture of unspecified carpal bone, right wrist, subsequent encounter for fracture with routine healing: Secondary | ICD-10-CM | POA: Diagnosis not present

## 2015-02-27 DIAGNOSIS — H524 Presbyopia: Secondary | ICD-10-CM | POA: Diagnosis not present

## 2015-02-27 DIAGNOSIS — H25813 Combined forms of age-related cataract, bilateral: Secondary | ICD-10-CM | POA: Diagnosis not present

## 2015-02-27 DIAGNOSIS — H52203 Unspecified astigmatism, bilateral: Secondary | ICD-10-CM | POA: Diagnosis not present

## 2015-02-27 DIAGNOSIS — D3132 Benign neoplasm of left choroid: Secondary | ICD-10-CM | POA: Diagnosis not present

## 2015-03-05 DIAGNOSIS — M79671 Pain in right foot: Secondary | ICD-10-CM | POA: Diagnosis not present

## 2015-03-05 DIAGNOSIS — M722 Plantar fascial fibromatosis: Secondary | ICD-10-CM | POA: Diagnosis not present

## 2015-03-10 DIAGNOSIS — M722 Plantar fascial fibromatosis: Secondary | ICD-10-CM | POA: Diagnosis not present

## 2015-03-10 DIAGNOSIS — M79671 Pain in right foot: Secondary | ICD-10-CM | POA: Diagnosis not present

## 2015-03-12 DIAGNOSIS — M79671 Pain in right foot: Secondary | ICD-10-CM | POA: Diagnosis not present

## 2015-03-12 DIAGNOSIS — M722 Plantar fascial fibromatosis: Secondary | ICD-10-CM | POA: Diagnosis not present

## 2015-03-12 DIAGNOSIS — S62101D Fracture of unspecified carpal bone, right wrist, subsequent encounter for fracture with routine healing: Secondary | ICD-10-CM | POA: Diagnosis not present

## 2015-03-18 DIAGNOSIS — M79671 Pain in right foot: Secondary | ICD-10-CM | POA: Diagnosis not present

## 2015-03-18 DIAGNOSIS — M722 Plantar fascial fibromatosis: Secondary | ICD-10-CM | POA: Diagnosis not present

## 2015-03-23 DIAGNOSIS — M79671 Pain in right foot: Secondary | ICD-10-CM | POA: Diagnosis not present

## 2015-03-23 DIAGNOSIS — M722 Plantar fascial fibromatosis: Secondary | ICD-10-CM | POA: Diagnosis not present

## 2015-03-24 DIAGNOSIS — L570 Actinic keratosis: Secondary | ICD-10-CM | POA: Diagnosis not present

## 2015-03-24 DIAGNOSIS — Z08 Encounter for follow-up examination after completed treatment for malignant neoplasm: Secondary | ICD-10-CM | POA: Diagnosis not present

## 2015-03-24 DIAGNOSIS — Z85820 Personal history of malignant melanoma of skin: Secondary | ICD-10-CM | POA: Diagnosis not present

## 2015-03-25 DIAGNOSIS — M79671 Pain in right foot: Secondary | ICD-10-CM | POA: Diagnosis not present

## 2015-03-25 DIAGNOSIS — M722 Plantar fascial fibromatosis: Secondary | ICD-10-CM | POA: Diagnosis not present

## 2015-04-02 DIAGNOSIS — S62101D Fracture of unspecified carpal bone, right wrist, subsequent encounter for fracture with routine healing: Secondary | ICD-10-CM | POA: Diagnosis not present

## 2015-04-03 DIAGNOSIS — S62101D Fracture of unspecified carpal bone, right wrist, subsequent encounter for fracture with routine healing: Secondary | ICD-10-CM | POA: Diagnosis not present

## 2015-04-08 DIAGNOSIS — S62101D Fracture of unspecified carpal bone, right wrist, subsequent encounter for fracture with routine healing: Secondary | ICD-10-CM | POA: Diagnosis not present

## 2015-04-09 DIAGNOSIS — M79671 Pain in right foot: Secondary | ICD-10-CM | POA: Diagnosis not present

## 2015-04-09 DIAGNOSIS — M722 Plantar fascial fibromatosis: Secondary | ICD-10-CM | POA: Diagnosis not present

## 2015-04-10 DIAGNOSIS — F4322 Adjustment disorder with anxiety: Secondary | ICD-10-CM | POA: Diagnosis not present

## 2015-04-14 DIAGNOSIS — S62101D Fracture of unspecified carpal bone, right wrist, subsequent encounter for fracture with routine healing: Secondary | ICD-10-CM | POA: Diagnosis not present

## 2015-04-15 DIAGNOSIS — M71571 Other bursitis, not elsewhere classified, right ankle and foot: Secondary | ICD-10-CM | POA: Diagnosis not present

## 2015-04-15 DIAGNOSIS — M7732 Calcaneal spur, left foot: Secondary | ICD-10-CM | POA: Diagnosis not present

## 2015-04-15 DIAGNOSIS — M79672 Pain in left foot: Secondary | ICD-10-CM | POA: Diagnosis not present

## 2015-04-15 DIAGNOSIS — M722 Plantar fascial fibromatosis: Secondary | ICD-10-CM | POA: Diagnosis not present

## 2015-04-15 DIAGNOSIS — M7731 Calcaneal spur, right foot: Secondary | ICD-10-CM | POA: Diagnosis not present

## 2015-04-15 DIAGNOSIS — M79671 Pain in right foot: Secondary | ICD-10-CM | POA: Diagnosis not present

## 2015-04-16 DIAGNOSIS — S62101D Fracture of unspecified carpal bone, right wrist, subsequent encounter for fracture with routine healing: Secondary | ICD-10-CM | POA: Diagnosis not present

## 2015-04-22 DIAGNOSIS — S62101D Fracture of unspecified carpal bone, right wrist, subsequent encounter for fracture with routine healing: Secondary | ICD-10-CM | POA: Diagnosis not present

## 2015-04-23 DIAGNOSIS — M7751 Other enthesopathy of right foot: Secondary | ICD-10-CM | POA: Diagnosis not present

## 2015-04-29 DIAGNOSIS — S62101D Fracture of unspecified carpal bone, right wrist, subsequent encounter for fracture with routine healing: Secondary | ICD-10-CM | POA: Diagnosis not present

## 2015-04-30 DIAGNOSIS — S62101D Fracture of unspecified carpal bone, right wrist, subsequent encounter for fracture with routine healing: Secondary | ICD-10-CM | POA: Diagnosis not present

## 2015-05-13 DIAGNOSIS — F4322 Adjustment disorder with anxiety: Secondary | ICD-10-CM | POA: Diagnosis not present

## 2015-05-27 ENCOUNTER — Ambulatory Visit: Admit: 2015-05-27 | Discharge: 2015-05-27 | Payer: MEDICARE | Attending: Specialist | Primary: Family Medicine

## 2015-05-27 DIAGNOSIS — C50911 Malignant neoplasm of unspecified site of right female breast: Secondary | ICD-10-CM

## 2015-05-27 MED ORDER — GABAPENTIN 300 MG CAP
300 mg | ORAL_CAPSULE | Freq: Every evening | ORAL | Status: DC
Start: 2015-05-27 — End: 2015-10-02

## 2015-05-27 NOTE — Progress Notes (Signed)
Dawn Green is a 74 y.o. female  Chief Complaint   Patient presents with   ??? Breast Cancer

## 2015-05-27 NOTE — Progress Notes (Signed)
[facility]  [address] [place], [place]   [address]  W: [phone]   F: [phone]      f/u HEME/ONC CONSULT    Reason for visit: management of breast cancer     Consulting physician:  Dr. Gilford Rile  Referring physician:  Dr. Minette Brine    HPI:   Dawn Green is a 74 y.o.  female who I am seeing in f/u for management of breast cancer.  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  Right lumpectomy on 04/24/13 showed 1.5 cm IDC, gr 2, no LVI, HER 2 negative (ratio 1.2, sig/cell 3), 0/3 LN involved, DCIS present with extensive intraductal component.    Was on estrogen patch for 10 years, stopped at diagnosis.      oncotype Dx = 23, intermediate, RR = 15%, ER + at 9.7, PR + at 7.5; HER 2 negative at 8.7    05/08/13 re-ex negative    S/p XRT from 06/19/13-07/10/13    Started anastrozole 07/17/13    Interval history:complains of gr 2 loss of appetite, gr 2 fatigue, gr 2 hot flashes, gr 2 insomnia, gr 1 cough, gr 3 vaginal dryness.  Hot flashes are "all day."      Normal colonoscopy summer 2015    Has fallen 5 times in the past few months, due to tripping over stairs, puppy    DX   Encounter Diagnoses   Name Primary?   ??? Malignant neoplasm of right female breast, unspecified site of breast ([place]) Yes   ??? Postmenopausal                 Past Medical History   Diagnosis Date   ??? Hypertension    ??? Cancer (HCC)      bcc chest, nose, lip, right breast cancer   ??? 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 Other ill-defined conditions(799.89)      glaucoma   ??? Other ill-defined conditions(799.89)      mild URI     Past Surgical History   Procedure Laterality Date   ??? Hx tah and bso  1997     hysterectomy   ??? Hx cataract removal Bilateral      w/ IOL implants   ??? Pr breast surgery procedure unlisted        R breast lumpectomy   ???   05/08/2013     RIGHT BREAST RE-EXCISION LUMPECTOMY performed by Trisha Mangle, MD at [facility]     History     Social History   ??? Marital Status: MARRIED     Spouse Name: N/A   ??? Number of Children: N/A   ??? Years of Education: N/A     Social History Main Topics   ??? Smoking status: Former Smoker -- 1.00 packs/day for 30 years     Quit date: 04/17/1996   ??? Smokeless tobacco: Never Used   ??? Alcohol Use: 7.0 oz/week     14 drink(s) per week      Comment: 14 gin and tonics/wk   ??? Drug Use: No   ??? Sexual Activity:     Partners: Male     Patent examiner Protection: Surgical  Other Topics Concern   ??? None     Social History Narrative     Family History   Problem Relation Age of Onset   ??? Cancer Mother 4     ovarian   ??? Cancer Son 56     pancreatic   ??? Cancer Paternal Aunt 36     pancreatic       Current Outpatient Prescriptions   Medication Sig Dispense Refill   ??? valsartan-hydrochlorothiazide (DIOVAN-HCT) 80-12.5 mg per tablet      ??? gabapentin (NEURONTIN) 300 mg capsule Take 1 Cap by mouth nightly. 30 Cap 3   ??? venlafaxine-SR (EFFEXOR-XR) 75 mg capsule Take 1 Cap by mouth daily. 90 Cap 3   ??? anastrozole (ARIMIDEX) 1 mg tablet TAKE ONE (1) TABLET(S) ONCE DAILY 90 Tab 3   ??? brimonidine-timolol (COMBIGAN) 0.2-0.5 % drop ophthalmic solution Administer 1 Drop to both eyes every twelve (12) hours.     ??? atorvastatin (LIPITOR) 40 mg tablet Take 40 mg by mouth Daily (before breakfast).     ??? zolpidem (AMBIEN) 5 mg tablet Take 1 Tab by mouth nightly as needed for Sleep. Max Daily Amount: 5 mg. 30 Tab 3   ??? CALCIUM CARBONATE (CALCIUM 500 PO) Take 1 Tab by mouth Daily (before breakfast).     ??? cholecalciferol (VITAMIN D3) 400 unit tab tablet Take 400 Units by mouth daily.     ??? aspirin 81 mg chewable tablet Take 81 mg by mouth daily.     ??? Omega-3-DHA-EPA-Fish Oil 1,000 (120-180) mg cap Take 2,400 mg by mouth two (2) times a day.      ??? multivitamin (ONE A DAY) tablet Take 1 Tab by mouth daily.     ??? B.infantis-B.ani-B.long-B.bifi (PROBIOTIC 4X) 10-15 mg TbEC Take 1 Tab by mouth Daily (before breakfast).         No Known Allergies    Review of Systems    A comprehensive review of systems was performed and all systems were negative except for HPI and for the symptom report form, reviewed and scanned in.        Objective:  Physical Exam:  BP 130/59 mmHg   Pulse 67   Temp(Src) 97.5 ??F (36.4 ??C) (Temporal)   Resp 16   Ht '5\' 3"'  (1.6 m)   Wt 159 lb 9.6 oz (72.394 kg)   BMI 28.28 kg/m2   SpO2 98%    General:  Alert, cooperative, no distress, appears stated age.   Head:  Normocephalic, without obvious abnormality, atraumatic.   Eyes:  Conjunctivae/corneas clear. PERRL, EOMs intact.   Throat: Lips, mucosa, and tongue normal.    Neck: Supple, symmetrical, trachea midline, no adenopathy, thyroid: no enlargement/tenderness/nodules   Back:   Symmetric, no curvature. ROM normal. No CVA tenderness.   Lungs:   Clear to auscultation bilaterally.       Heart:  Regular rate and rhythm, S1, S2 normal, no murmur, click, rub or gallop.   Abdomen:   Soft, non-tender. Bowel sounds normal. No masses,  No organomegaly.   Extremities: Extremities normal, atraumatic, no cyanosis or edema.   Skin: Skin color, texture, turgor normal. No rashes or lesions.   Lymph nodes: Cervical, supraclavicular, and axillary nodes normal.   Neurologic: CNII-XII intact.  Breasts:  No masses bilaterally; right breast with radiation skin changes, no erythema of skin  Diagnostic Imaging   No results found for this or any previous visit.    No results found for this or any previous visit.    05/22/13 dexa  Lumbar spine: L1-4   Bone mineral density (gm/cm2): 1.325   % of peak bone mass: 111   % for age matched controls: 131   T score: 1.1   Z score: 2.6   Hip: Right femoral neck   Bone mineral density (gm/cm2): 0.927    % of peak bone mass: 89   % for age matched controls: 31   T score: -0.8   Z score: 0.8   IMPRESSION:   This patient is normal using the World Health Organization criteria   As compared to the prior study, there has been no significant change   10 year probability of major osteoporotic fracture: 8.7%   10 year probability of hip fracture: 0.9%    02/06/15 Bilateral Mammogram  Negative    Lab Results  Lab Results   Component Value Date/Time    WBC 6.9 04/17/2013 11:01 AM    HGB 13.9 04/17/2013 11:01 AM    HCT 41.4 04/17/2013 11:01 AM    PLATELET 256 04/17/2013 11:01 AM    MCV 91.8 04/17/2013 11:01 AM       Lab Results   Component Value Date/Time    SODIUM 143 04/17/2013 11:01 AM    POTASSIUM 4.3 04/17/2013 11:01 AM    CHLORIDE 104 04/17/2013 11:01 AM    CO2 33 04/17/2013 11:01 AM    ANION GAP 6 04/17/2013 11:01 AM    GLUCOSE 98 04/17/2013 11:01 AM    BUN 17 04/17/2013 11:01 AM    CREATININE 0.56 04/17/2013 11:01 AM    BUN/CREATININE RATIO 30 04/17/2013 11:01 AM    GFR EST AA >60 04/17/2013 11:01 AM    GFR EST NON-AA >60 04/17/2013 11:01 AM    CALCIUM 9.6 04/17/2013 11:01 AM       .    Assessment/Plan:  74 y.o. 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  PS 0    1. Breast cancer stage: IA    Hormonal therapy: administered    No evidence of recurrence, continue anastrozole.    Mammogram April 2017    Ordered repeat dexa    2. Hot flashes:  No improvement, currently taking Effexor 75 mg daily in the AM.  Will rx gabapentin 300 mg qhs; also discussed acupuntcure, but not interested at this time    3. Insomnia: stable, decided to not try ambien; gabapentin as above    4. Breast redness:  Fading; Due to radiation changes, no masses, no peau d' orange    5. Falling:  She is going to the gym for core training and has discussed with Dr. Shanda Howells; will monitor; no dizziness.        Thank you for this consult.  All of the patient's questions were answered today.           Patient Instructions   Continue anastrozole    Try gabapentin 300 mg at bedtime    Bone density study       Follow-up Disposition: Not on File    Sonda Rumble MD

## 2015-05-27 NOTE — Patient Instructions (Signed)
Continue anastrozole    Try gabapentin 300 mg at bedtime    Bone density study

## 2015-06-05 ENCOUNTER — Inpatient Hospital Stay: Admit: 2015-06-05 | Payer: MEDICARE | Attending: Specialist | Primary: Family Medicine

## 2015-06-05 DIAGNOSIS — Z780 Asymptomatic menopausal state: Secondary | ICD-10-CM

## 2015-06-08 ENCOUNTER — Encounter

## 2015-06-08 DIAGNOSIS — F4322 Adjustment disorder with anxiety: Secondary | ICD-10-CM | POA: Diagnosis not present

## 2015-06-08 MED ORDER — ALENDRONATE 35 MG TAB
35 mg | ORAL_TABLET | ORAL | 3 refills | Status: DC
Start: 2015-06-08 — End: 2015-12-30

## 2015-06-08 NOTE — Progress Notes (Signed)
We discussed the data from [place], [place] 2013, for the meta-analysis for adjuvant bisphosphonate use in breast cancer.  We discussed that in postmenopausal women, it appears that the bisphosphate zolendronic acid, given as in ABCSG 12 at 4 mg IV q 6 months x 3 years, is beneficial in improving bone DFS and OS.  We discussed side effects such as ONJ and the need to avoid invasive dental procedures while on these medications, renal damage, and hypocalcemia. Pt is agreeable to starting Fosamax, rx today.

## 2015-07-13 DIAGNOSIS — F4322 Adjustment disorder with anxiety: Secondary | ICD-10-CM | POA: Diagnosis not present

## 2015-07-18 DIAGNOSIS — Z23 Encounter for immunization: Secondary | ICD-10-CM | POA: Diagnosis not present

## 2015-07-27 DIAGNOSIS — H25813 Combined forms of age-related cataract, bilateral: Secondary | ICD-10-CM | POA: Diagnosis not present

## 2015-07-27 DIAGNOSIS — H52213 Irregular astigmatism, bilateral: Secondary | ICD-10-CM | POA: Diagnosis not present

## 2015-07-27 DIAGNOSIS — H18601 Keratoconus, unspecified, right eye: Secondary | ICD-10-CM | POA: Diagnosis not present

## 2015-07-27 DIAGNOSIS — D3132 Benign neoplasm of left choroid: Secondary | ICD-10-CM | POA: Diagnosis not present

## 2015-07-28 DIAGNOSIS — E785 Hyperlipidemia, unspecified: Secondary | ICD-10-CM | POA: Diagnosis not present

## 2015-07-28 DIAGNOSIS — E559 Vitamin D deficiency, unspecified: Secondary | ICD-10-CM | POA: Diagnosis not present

## 2015-07-28 DIAGNOSIS — R7301 Impaired fasting glucose: Secondary | ICD-10-CM | POA: Diagnosis not present

## 2015-08-04 DIAGNOSIS — Z1389 Encounter for screening for other disorder: Secondary | ICD-10-CM | POA: Diagnosis not present

## 2015-08-04 DIAGNOSIS — E785 Hyperlipidemia, unspecified: Secondary | ICD-10-CM | POA: Diagnosis not present

## 2015-08-04 DIAGNOSIS — Z6828 Body mass index (BMI) 28.0-28.9, adult: Secondary | ICD-10-CM | POA: Diagnosis not present

## 2015-08-04 DIAGNOSIS — M859 Disorder of bone density and structure, unspecified: Secondary | ICD-10-CM | POA: Diagnosis not present

## 2015-08-04 DIAGNOSIS — E559 Vitamin D deficiency, unspecified: Secondary | ICD-10-CM | POA: Diagnosis not present

## 2015-08-04 DIAGNOSIS — C439 Malignant melanoma of skin, unspecified: Secondary | ICD-10-CM | POA: Diagnosis not present

## 2015-08-04 DIAGNOSIS — Z0000 Encounter for general adult medical examination without abnormal findings: Secondary | ICD-10-CM | POA: Diagnosis not present

## 2015-08-04 DIAGNOSIS — M8580 Other specified disorders of bone density and structure, unspecified site: Secondary | ICD-10-CM | POA: Diagnosis not present

## 2015-08-04 DIAGNOSIS — R7301 Impaired fasting glucose: Secondary | ICD-10-CM | POA: Diagnosis not present

## 2015-08-13 ENCOUNTER — Encounter: Attending: Family Medicine | Primary: Family Medicine

## 2015-08-13 DIAGNOSIS — F4322 Adjustment disorder with anxiety: Secondary | ICD-10-CM | POA: Diagnosis not present

## 2015-09-01 ENCOUNTER — Ambulatory Visit: Admit: 2015-09-01 | Discharge: 2015-09-01 | Payer: MEDICARE | Attending: Family Medicine | Primary: Family Medicine

## 2015-09-01 DIAGNOSIS — R053 Chronic cough: Secondary | ICD-10-CM

## 2015-09-01 MED ORDER — IPRATROPIUM BROMIDE 0.03 % NASAL SPRAY AEROSOL
21 mcg (0.03 %) | Freq: Two times a day (BID) | NASAL | 5 refills | Status: DC
Start: 2015-09-01 — End: 2016-05-25

## 2015-09-01 MED ORDER — PREDNISONE 10 MG TABLETS IN A DOSE PACK
10 mg | ORAL_TABLET | ORAL | 0 refills | Status: DC
Start: 2015-09-01 — End: 2015-10-02

## 2015-09-01 NOTE — Progress Notes (Signed)
Pt here to est care  C/o cough and congestion since beginning of sept. Was tx'd for ear infection x 2.   Was also told she may have fungal infection in lungs

## 2015-09-01 NOTE — Progress Notes (Signed)
Dawn Green is a 74 y.o. female   Chief Complaint   Patient presents with   ??? Establish Care    pt here to establish care.  Pt states that she has been on mul;tiple courses of abx for a cough and has not releived.  Was told she also had an ear infection by PA then told by another PA may have mycoplasma and was told this is a fungus, pt advised this is not tyoday.   Pt states CXR was also clear at the time.  Pt also has a lot of sinus issues was taking claritin without relief and tried mucinex which she states dried her out.      Chief Complaint   Patient presents with   ??? Establish Care     she is a 74 y.o. year old female who presents for evalution.    Reviewed PmHx, RxHx, FmHx, SocHx, AllgHx and updated and dated in the chart.    Review of Systems - negative except as listed above in the HPI    Objective:     Vitals:    09/01/15 1448   BP: 144/74   Pulse: 86   Resp: 18   Temp: 98.2 ??F (36.8 ??C)   TempSrc: Oral   SpO2: 98%   Weight: 161 lb 6.4 oz (73.2 kg)   Height: '5\' 3"'$  (1.6 m)       Current Outpatient Prescriptions   Medication Sig   ??? finasteride (PROPECIA) 1 mg tablet Take 1 mg by mouth daily.   ??? predniSONE (STERAPRED DS) 10 mg dose pack See administration instruction per '10mg'$  dose pack   ??? ipratropium (ATROVENT) 0.03 % nasal spray 2 Sprays by Both Nostrils route every twelve (12) hours.   ??? alendronate (FOSAMAX) 35 mg tablet Take 1 Tab by mouth every seven (7) days.   ??? valsartan-hydrochlorothiazide (DIOVAN-HCT) 80-12.5 mg per tablet    ??? venlafaxine-SR (EFFEXOR-XR) 75 mg capsule Take 1 Cap by mouth daily.   ??? brimonidine-timolol (COMBIGAN) 0.2-0.5 % drop ophthalmic solution Administer 1 Drop to both eyes every twelve (12) hours.   ??? atorvastatin (LIPITOR) 40 mg tablet Take 40 mg by mouth Daily (before breakfast).   ??? Omega-3-DHA-EPA-Fish Oil 1,000 (120-180) mg cap Take 2,400 mg by mouth two (2) times a day.   ??? gabapentin (NEURONTIN) 300 mg capsule Take 1 Cap by mouth nightly.    ??? anastrozole (ARIMIDEX) 1 mg tablet TAKE ONE (1) TABLET(S) ONCE DAILY   ??? zolpidem (AMBIEN) 5 mg tablet Take 1 Tab by mouth nightly as needed for Sleep. Max Daily Amount: 5 mg.   ??? CALCIUM CARBONATE (CALCIUM 500 PO) Take 1 Tab by mouth Daily (before breakfast).   ??? cholecalciferol (VITAMIN D3) 400 unit tab tablet Take 400 Units by mouth daily.   ??? aspirin 81 mg chewable tablet Take 81 mg by mouth daily.   ??? multivitamin (ONE A DAY) tablet Take 1 Tab by mouth daily.   ??? B.infantis-B.ani-B.long-B.bifi (PROBIOTIC 4X) 10-15 mg TbEC Take 1 Tab by mouth Daily (before breakfast).     No current facility-administered medications for this visit.        Physical Examination: General appearance - alert, well appearing, and in no distress  Eyes - pupils equal and reactive, extraocular eye movements intact  Ears - bilateral TM's and external ear canals normal  Nose - mucosal congestion  Mouth - mucous membranes moist, pharynx normal without lesions  Neck - supple, no significant adenopathy  Chest -  clear to auscultation, no wheezes, rales or rhonchi, symmetric air entry  Heart - normal rate, regular rhythm, normal S1, S2, no murmurs, rubs, clicks or gallops      Assessment/ Plan:   Dawn Green was seen today for establish care.    Diagnoses and all orders for this visit:    Chronic cough  -     predniSONE (STERAPRED DS) 10 mg dose pack; See administration instruction per '10mg'$  dose pack  -     ipratropium (ATROVENT) 0.03 % nasal spray; 2 Sprays by Both Nostrils route every twelve (12) hours.    Other chronic sinusitis  -     ipratropium (ATROVENT) 0.03 % nasal spray; 2 Sprays by Both Nostrils route every twelve (12) hours.     cough possibly from post nasal drip, no PNA  Follow-up Disposition:  Return in about 1 month (around 10/01/2015), or if symptoms worsen or fail to improve, for medicare wellness, fasting labs.    I have discussed the diagnosis with the patient and the intended plan as  seen in the above orders.  The patient has received an after-visit summary and questions were answered concerning future plans. Pt conveyed understanding of plan.    Medication Side Effects and Warnings were discussed with patient      Dawn Grew, DO

## 2015-09-01 NOTE — Patient Instructions (Signed)
Cough: Care Instructions  Your Care Instructions  A cough is your body's response to something that bothers your throat or airways. Many things can cause a cough. You might cough because of a cold or the flu, bronchitis, or asthma. Smoking, postnasal drip, allergies, and stomach acid that backs up into your throat also can cause coughs.  A cough is a symptom, not a disease. Most coughs stop when the cause, such as a cold, goes away. You can take a few steps at home to cough less and feel better.  Follow-up care is a key part of your treatment and safety. Be sure to make and go to all appointments, and call your doctor if you are having problems. It's also a good idea to know your test results and keep a list of the medicines you take.  How can you care for yourself at home?  ?? Drink lots of water and other fluids. This helps thin the mucus and soothes a dry or sore throat. Honey or lemon juice in hot water or tea may ease a dry cough.  ?? Take cough medicine as directed by your doctor.  ?? Prop up your head on pillows to help you breathe and ease a dry cough.  ?? Try cough drops to soothe a dry or sore throat. Cough drops don't stop a cough. Medicine-flavored cough drops are no better than candy-flavored drops or hard candy.  ?? Do not smoke. Avoid secondhand smoke. If you need help quitting, talk to your doctor about stop-smoking programs and medicines. These can increase your chances of quitting for good.  When should you call for help?  Call 911 anytime you think you may need emergency care. For example, call if:  ?? You have severe trouble breathing.  Call your doctor now or seek immediate medical care if:  ?? You cough up blood.  ?? You have new or worse trouble breathing.  ?? You have a new or higher fever.  ?? You have a new rash.  Watch closely for changes in your health, and be sure to contact your doctor if:  ?? You cough more deeply or more often, especially if you notice more mucus  or a change in the color of your mucus.  ?? You have new symptoms, such as a sore throat, an earache, or sinus pain.  ?? You do not get better as expected.  Where can you learn more?  Go to http://www.healthwise.net/[facility]Help[facility]ctions  Enter D279 in the search box to learn more about "Cough: Care Instructions."  ?? 2006-2016 [employer]. Care instructions adapted under license by [employer] (which disclaims liability or warranty for this information). This care instruction is for use with your licensed healthcare professional. If you have questions about a medical condition or this instruction, always ask your healthcare professional. Healthwise, Incorporated disclaims any warranty or liability for your use of this information.  Content Version: 11.0.578772; Current as of: Mar 13, 2015

## 2015-09-14 DIAGNOSIS — E6609 Other obesity due to excess calories: Secondary | ICD-10-CM | POA: Diagnosis not present

## 2015-09-14 DIAGNOSIS — E785 Hyperlipidemia, unspecified: Secondary | ICD-10-CM | POA: Diagnosis not present

## 2015-09-14 DIAGNOSIS — I10 Essential (primary) hypertension: Secondary | ICD-10-CM | POA: Diagnosis not present

## 2015-09-14 DIAGNOSIS — E663 Overweight: Secondary | ICD-10-CM | POA: Diagnosis not present

## 2015-09-15 DIAGNOSIS — E785 Hyperlipidemia, unspecified: Secondary | ICD-10-CM | POA: Diagnosis not present

## 2015-09-15 DIAGNOSIS — E663 Overweight: Secondary | ICD-10-CM | POA: Diagnosis not present

## 2015-09-23 DIAGNOSIS — Z08 Encounter for follow-up examination after completed treatment for malignant neoplasm: Secondary | ICD-10-CM | POA: Diagnosis not present

## 2015-09-23 DIAGNOSIS — L570 Actinic keratosis: Secondary | ICD-10-CM | POA: Diagnosis not present

## 2015-09-23 DIAGNOSIS — Z85820 Personal history of malignant melanoma of skin: Secondary | ICD-10-CM | POA: Diagnosis not present

## 2015-09-23 DIAGNOSIS — Z85828 Personal history of other malignant neoplasm of skin: Secondary | ICD-10-CM | POA: Diagnosis not present

## 2015-10-02 ENCOUNTER — Ambulatory Visit: Admit: 2015-10-02 | Discharge: 2015-10-02 | Payer: MEDICARE | Attending: Family Medicine | Primary: Family Medicine

## 2015-10-02 ENCOUNTER — Inpatient Hospital Stay: Admit: 2015-12-02 | Payer: MEDICARE | Primary: Family Medicine

## 2015-10-02 DIAGNOSIS — E782 Mixed hyperlipidemia: Secondary | ICD-10-CM

## 2015-10-02 NOTE — Patient Instructions (Signed)
Cetirizine (By mouth)   Cetirizine (se-TIR-i-zeen)  Treats hay fever and allergy symptoms, hives, and itching.   Brand Name(s):All Day Allergy, Children's All Day Allergy, Children's Cetirizine Hydrochloride, Children's [facility] All Day Allergy, Children's ZyrTEC, Children's ZyrTEC Allergy, [facility] All Day Allergy, [facility] Children's All Day Allergy, [facility] All Day Allergy, [facility] Children's All Day Allergy, [facility] All Day Allergy, [facility] Cetirizine Hydrochloride, [facility] Allergy Relief, [facility] Cetirizine Hydrochloride, [facility] Children's Allergy Relief   There may be other brand names for this medicine.  When This Medicine Should Not Be Used:   You should not use this medicine if you have had an allergic reaction to cetirizine or hydroxyzine (Atarax??, Vistaril??). Do not give any over-the-counter (OTC) cough and cold medicine to a baby or child under 2 years old. Using these medicines in very young children might cause serious or possibly life-threatening side effects.  How to Use This Medicine:   Tablet, Chewable Tablet, Capsule, Liquid  ?? Your doctor will tell you how much medicine to use. Do not use more than directed.  ?? You may take this medicine with or without food.  ?? Measure the oral liquid medicine with a marked measuring spoon, oral syringe, or medicine cup.  ?? Chew the chewable tablet thoroughly before you swallow it. You may also let the chewable tablet melt slowly in your mouth.  ?? Swallow the tablet whole with a full glass of water. Do not chew, crush, or break it.  If a dose is missed:   ?? Take a dose as soon as you remember. If it is almost time for your next dose, wait until then and take a regular dose. Do not take extra medicine to make up for a missed dose.  How to Store and Dispose of This Medicine:   ?? Store the medicine in a closed container at room temperature, away from heat, moisture, and direct light.   ?? The liquid medicine may be kept in the refrigerator. Do not freeze.  ?? Ask your pharmacist, doctor, or health caregiver about the best way to dispose of any outdated medicine or medicine no longer needed.  ?? Keep all medicine out of the reach of children. Never share your medicine with anyone.  Drugs and Foods to Avoid:   Ask your doctor or pharmacist before using any other medicine, including over-the-counter medicines, vitamins, and herbal products.  ?? Make sure your doctor knows if you are also using theophylline.  ?? Avoid drinking alcohol while using this medicine.  ?? Make sure your doctor knows if you are also using other medicines that might make you sleepy such as cold or allergy medicines, muscle relaxants, tranquilizers, sleeping pills, or strong pain killers.  Warnings While Using This Medicine:   ?? Talk with your doctor before taking this medicine if you are pregnant or breast feeding.  ?? Check with your doctor before using this medicine if you have kidney disease or liver disease.  ?? This medicine may make you dizzy or drowsy. Avoid driving, using machines, or doing anything else that could be dangerous if you are not alert.  Possible Side Effects While Using This Medicine:   Call your doctor right away if you notice any of these side effects:  ?? Allergic reaction: Itching or hives, swelling in your face or hands, swelling or tingling in your mouth or throat, chest tightness, trouble breathing  ?? Skin or whites of your eyes turn yellow.    If you notice these less serious side effects, talk with your doctor:   ?? Drowsiness or dizziness.  ?? Dry mouth.  If you notice other side effects that you think are caused by this medicine, tell your doctor.   Call your doctor for medical advice about side effects. You may report side effects to FDA at 1-800-FDA-1088  ?? 2016 [employer] Information is for End User's use only and may not be sold, redistributed or otherwise used for commercial  purposes.  The above information is an educational aid only. It is not intended as medical advice for individual conditions or treatments. Talk to your doctor, nurse or pharmacist before following any medical regimen to see if it is safe and effective for you.

## 2015-10-02 NOTE — Progress Notes (Signed)
Dawn Green is a 74 y.o. female   Chief Complaint   Patient presents with   ??? Labs    pt here for fasting labs pt with Hld and is taking her lipitor nightly without issue.   Pt BP remains well controlled on medication.  Pt also with continued chronic cough and states that she had a CXR which was normal.  Pt does have a lot of PND and has tried antihistamine but inconsistent.  Pt also with a  Pain that feels like a heartburn.  Not necessarily occurs with meals.  Last for a minute then goes away, no diaphoresis, no CP oitherwise    Chief Complaint   Patient presents with   ??? Labs     she is a 74 y.o. year old female who presents for evalution.    Reviewed PmHx, RxHx, FmHx, SocHx, AllgHx and updated and dated in the chart.    Review of Systems - negative except as listed above in the HPI    Objective:     Vitals:    10/02/15 0925   BP: 126/72   Pulse: 78   Resp: 18   Temp: 98.2 ??F (36.8 ??C)   TempSrc: Oral   SpO2: 99%   Weight: 161 lb 1.6 oz (73.1 kg)   Height: '5\' 3"'$  (1.6 m)       Current Outpatient Prescriptions   Medication Sig   ??? alendronate (FOSAMAX) 35 mg tablet Take 1 Tab by mouth every seven (7) days.   ??? valsartan-hydrochlorothiazide (DIOVAN-HCT) 80-12.5 mg per tablet    ??? venlafaxine-SR (EFFEXOR-XR) 75 mg capsule Take 1 Cap by mouth daily.   ??? atorvastatin (LIPITOR) 40 mg tablet Take 40 mg by mouth Daily (before breakfast).   ??? ipratropium (ATROVENT) 0.03 % nasal spray 2 Sprays by Both Nostrils route every twelve (12) hours.   ??? anastrozole (ARIMIDEX) 1 mg tablet TAKE ONE (1) TABLET(S) ONCE DAILY   ??? cholecalciferol (VITAMIN D3) 400 unit tab tablet Take 400 Units by mouth daily.   ??? brimonidine-timolol (COMBIGAN) 0.2-0.5 % drop ophthalmic solution Administer 1 Drop to both eyes every twelve (12) hours.   ??? aspirin 81 mg chewable tablet Take 81 mg by mouth daily.   ??? Omega-3-DHA-EPA-Fish Oil 1,000 (120-180) mg cap Take 2,400 mg by mouth two (2) times a day.    ??? multivitamin (ONE A DAY) tablet Take 1 Tab by mouth daily.     No current facility-administered medications for this visit.        Physical Examination: General appearance - alert, well appearing, and in no distress  Mental status - alert, oriented to person, place, and time  Eyes - pupils equal and reactive, extraocular eye movements intact  Ears - bilateral TM's and external ear canals normal  Nose - normal and patent, no erythema, discharge or polyps  Mouth - mucous membranes moist, pharynx normal without lesions  Neck - supple, no significant adenopathy  Lymphatics - no palpable lymphadenopathy, no hepatosplenomegaly  Chest - clear to auscultation, no wheezes, rales or rhonchi, symmetric air entry  Heart - normal rate, regular rhythm, normal S1, S2, no murmurs, rubs, clicks or gallops  Abdomen - soft, nontender, nondistended, no masses or organomegaly  Back exam - full range of motion, no tenderness, palpable spasm or pain on motion  Neurological - alert, oriented, normal speech, no focal findings or movement disorder noted  Musculoskeletal - no joint tenderness, deformity or swelling  Extremities - peripheral pulses normal, no  pedal edema, no clubbing or cyanosis  Skin - normal coloration and turgor, no rashes, no suspicious skin lesions noted      Assessment/ Plan:   Rawan was seen today for labs.    Diagnoses and all orders for this visit:    Mixed hyperlipidemia  -     LIPID PANEL  -     METABOLIC PANEL, COMPREHENSIVE  -     CBC WITH AUTOMATED DIFF  -     TSH 3RD GENERATION    Routine general medical examination at a health care facility    Screening for alcoholism    Essential hypertension  -     LIPID PANEL  -     METABOLIC PANEL, COMPREHENSIVE  -     CBC WITH AUTOMATED DIFF  -     TSH 3RD GENERATION    Hypovitaminosis D  -     VITAMIN D, 25 HYDROXY    Chronic cough  Anti histamine/zyrtec daily     Reflux  Tums PRN  Follow-up Disposition:   Return in about 6 months (around 04/01/2016), or if symptoms worsen or fail to improve.    I have discussed the diagnosis with the patient and the intended plan as seen in the above orders.  The patient has received an after-visit summary and questions were answered concerning future plans. Pt conveyed understanding of plan.    Medication Side Effects and Warnings were discussed with patient      Dawn Grew, DO       This is an Initial Medicare Annual Wellness Exam (AWV) (Performed 12 months after IPPE or effective date of Medicare Part B enrollment, Once in a lifetime)    I have reviewed the patient's medical history in detail and updated the computerized patient record.     History     Past Medical History   Diagnosis Date   ??? Cancer ([place])      bcc chest, nose, lip, right breast cancer   ??? Hypertension    ??? 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 Other ill-defined conditions(799.89)      glaucoma   ??? Other ill-defined conditions(799.89)      mild URI      Past Surgical History   Procedure Laterality Date   ??? Hx tah and bso  1997     hysterectomy   ??? Hx cataract removal Bilateral      w/ IOL implants   ??? Pr breast surgery procedure unlisted       R breast lumpectomy   ???   05/08/2013     RIGHT BREAST RE-EXCISION LUMPECTOMY performed by Trisha Mangle, MD at [facility]     Current Outpatient Prescriptions   Medication Sig Dispense Refill   ??? alendronate (FOSAMAX) 35 mg tablet Take 1 Tab by mouth every seven (7) days. 12 Tab 3   ??? valsartan-hydrochlorothiazide (DIOVAN-HCT) 80-12.5 mg per tablet      ??? venlafaxine-SR (EFFEXOR-XR) 75 mg capsule Take 1 Cap by mouth daily. 90 Cap 3   ??? atorvastatin (LIPITOR) 40 mg tablet Take 40 mg by mouth Daily (before breakfast).     ??? ipratropium (ATROVENT) 0.03 % nasal spray 2 Sprays by Both Nostrils route every twelve (12) hours. 30 mL 5   ??? anastrozole (ARIMIDEX) 1 mg tablet TAKE ONE (1) TABLET(S) ONCE DAILY 90 Tab 3    ??? cholecalciferol (VITAMIN D3) 400 unit tab tablet Take 400  Units by mouth daily.     ??? brimonidine-timolol (COMBIGAN) 0.2-0.5 % drop ophthalmic solution Administer 1 Drop to both eyes every twelve (12) hours.     ??? aspirin 81 mg chewable tablet Take 81 mg by mouth daily.     ??? Omega-3-DHA-EPA-Fish Oil 1,000 (120-180) mg cap Take 2,400 mg by mouth two (2) times a day.     ??? multivitamin (ONE A DAY) tablet Take 1 Tab by mouth daily.       No Known Allergies  Family History   Problem Relation Age of Onset   ??? Cancer Mother 57     ovarian   ??? Cancer Son 17     pancreatic   ??? Cancer Paternal Aunt 72     pancreatic     Social History   Substance Use Topics   ??? Smoking status: Former Smoker     Packs/day: 1.00     Years: 30.00     Quit date: 04/17/1996   ??? Smokeless tobacco: Never Used   ??? Alcohol use 7.0 oz/week     14 Standard drinks or equivalent per week      Comment: 14 gin and tonics/wk     Patient Active Problem List   Diagnosis Code   ??? Breast cancer, right ([place]) C50.911   ??? Postmenopausal Z78.0         Depression Risk Factor Screening:     PHQ 2 / 9, over the last two weeks 10/02/2015   Little interest or pleasure in doing things Not at all   Feeling down, depressed or hopeless Not at all   Total Score PHQ 2 0     Alcohol Risk Factor Screening:   On any occasion during the past 3 months, have you had more than 3 drinks containing alcohol?  Yes    Do you average more than 7 drinks per week?  Yes, 2 drinks per day    Functional Ability and Level of Safety:     Hearing Loss   normal-to-mild    Activities of Daily Living   Self-care.   Requires assistance with: no ADLs    Fall Risk     Fall Risk Assessment, last 12 mths 10/02/2015   Able to walk? Yes   Fall in past 12 months? Yes   Fall with injury? No   Number of falls in past 12 months 4   Fall Risk Score 4     Abuse Screen   Patient is not abused    Review of Systems   A comprehensive review of systems was negative except for that written in the HPI.     Physical Examination     No exam data present    Evaluation of Cognitive Function:  Mood/affect:  happy  Appearance: age appropriate  Family member/caregiver input: n/a    See above    Patient Care Team:  Dawn Grew, DO as PCP - General (Family Practice)  Joyce Gross, MD (Hematology and Oncology)  Lyndel Safe, MD as Physician (Radiation Oncology)    Advice/Referrals/Counseling   Education and counseling provided:  Are appropriate based on today's review and evaluation  End-of-Life planning (with patient's consent)    Assessment/Plan       ICD-10-CM ICD-9-CM    1. Mixed hyperlipidemia E78.2 272.2 LIPID PANEL      METABOLIC PANEL, COMPREHENSIVE      CBC WITH AUTOMATED DIFF      TSH 3RD GENERATION   2. Routine  general medical examination at a health care facility Z00.00 V70.0    3. Screening for alcoholism Z13.89 V79.1    4. Essential hypertension I10 401.9 LIPID PANEL      METABOLIC PANEL, COMPREHENSIVE      CBC WITH AUTOMATED DIFF      TSH 3RD GENERATION   5. Hypovitaminosis D E55.9 268.9 VITAMIN D, 25 HYDROXY   6. Chronic cough R05 786.2    7. Gastroesophageal reflux disease, esophagitis presence not specified K21.9 530.81    .    I have discussed the diagnosis with the patient and the intended plan as seen in the above orders.  The patient has received an after-visit summary and questions were answered concerning future plans. Pt conveyed understanding of plan.      Dr Dawn Green

## 2015-10-02 NOTE — Progress Notes (Signed)
Pt c/o continued cough and post nasal drip since last visit   Also fasting labs

## 2015-10-03 LAB — CBC WITH AUTOMATED DIFF
ABS. BASOPHILS: 0 10*3/uL (ref 0.0–0.2)
ABS. EOSINOPHILS: 0 10*3/uL (ref 0.0–0.4)
ABS. IMM. GRANS.: 0 10*3/uL (ref 0.0–0.1)
ABS. MONOCYTES: 0.8 10*3/uL (ref 0.1–0.9)
ABS. NEUTROPHILS: 2.3 10*3/uL (ref 1.4–7.0)
Abs Lymphocytes: 1.7 10*3/uL (ref 0.7–3.1)
BASOPHILS: 0 %
EOSINOPHILS: 0 %
HCT: 37.3 % (ref 34.0–46.6)
HGB: 12.6 g/dL (ref 11.1–15.9)
IMMATURE GRANULOCYTES: 1 %
Lymphocytes: 35 %
MCH: 31 pg (ref 26.6–33.0)
MCHC: 33.8 g/dL (ref 31.5–35.7)
MCV: 92 fL (ref 79–97)
MONOCYTES: 16 %
NEUTROPHILS: 48 %
PLATELET: 299 10*3/uL (ref 150–379)
RBC: 4.06 x10E6/uL (ref 3.77–5.28)
RDW: 13.5 % (ref 12.3–15.4)
WBC: 4.8 10*3/uL (ref 3.4–10.8)

## 2015-10-03 LAB — METABOLIC PANEL, COMPREHENSIVE
A-G Ratio: 1.5 (ref 1.1–2.5)
ALT (SGPT): 22 IU/L (ref 0–32)
AST (SGOT): 17 IU/L (ref 0–40)
Albumin: 4 g/dL (ref 3.5–4.8)
Alk. phosphatase: 62 IU/L (ref 39–117)
BUN/Creatinine ratio: 35 — ABNORMAL HIGH (ref 11–26)
BUN: 18 mg/dL (ref 8–27)
Bilirubin, total: 0.4 mg/dL (ref 0.0–1.2)
CO2: 27 mmol/L (ref 18–29)
Calcium: 9.1 mg/dL (ref 8.7–10.3)
Chloride: 101 mmol/L (ref 96–106)
Creatinine: 0.52 mg/dL — ABNORMAL LOW (ref 0.57–1.00)
GFR est AA: 109 mL/min/{1.73_m2} (ref >59)
GFR est non-AA: 94 mL/min/{1.73_m2} (ref >59)
GLOBULIN, TOTAL: 2.6 g/dL (ref 1.5–4.5)
Glucose: 139 mg/dL — ABNORMAL HIGH (ref 65–99)
Potassium: 4.2 mmol/L (ref 3.5–5.2)
Protein, total: 6.6 g/dL (ref 6.0–8.5)
Sodium: 142 mmol/L (ref 134–144)

## 2015-10-03 LAB — LIPID PANEL
Cholesterol, total: 204 mg/dL — ABNORMAL HIGH (ref 100–199)
HDL Cholesterol: 38 mg/dL — ABNORMAL LOW (ref >39)
Triglyceride: 592 mg/dL — CR (ref 0–149)

## 2015-10-03 LAB — VITAMIN D, 25 HYDROXY: VITAMIN D, 25-HYDROXY: 13 ng/mL — ABNORMAL LOW (ref 30.0–100.0)

## 2015-10-03 LAB — TSH 3RD GENERATION: TSH: 2.07 u[IU]/mL (ref 0.450–4.500)

## 2015-10-03 LAB — CVD REPORT

## 2015-10-05 ENCOUNTER — Encounter

## 2015-10-05 MED ORDER — ERGOCALCIFEROL (VITAMIN D2) 50,000 UNIT CAP
1250 mcg (50,000 unit) | ORAL_CAPSULE | ORAL | 5 refills | Status: DC
Start: 2015-10-05 — End: 2015-10-09

## 2015-10-05 MED ORDER — FENOFIBRIC ACID 135 MG CAP, DELAYED RELEASE
135 mg | ORAL_CAPSULE | Freq: Every day | ORAL | 3 refills | Status: DC
Start: 2015-10-05 — End: 2015-10-22

## 2015-10-05 NOTE — Progress Notes (Signed)
Your triglycerides are quite high as you suspected they would be. Start the krill oil/fish oil everyday and we can recheck in 3 months.  Your Vit D level is low and I will send in a weekly vitamin D supplement for you.  We can check this when we recheck your fasting labs.

## 2015-10-06 LAB — SPECIMEN STATUS REPORT

## 2015-10-06 LAB — HEMOGLOBIN A1C WITH EAG
Estimated average glucose: 143 mg/dL
Hemoglobin A1c: 6.6 % — ABNORMAL HIGH (ref 4.8–5.6)

## 2015-10-06 NOTE — Progress Notes (Signed)
Pt idx3, is agreeable to metformin. Pharm on file verified.

## 2015-10-06 NOTE — Progress Notes (Signed)
Hgba1C is showing type 2 diabetes, 6.6, pt needs to start metformin which I can send in if agreeable and work on her diet.  We can recheck in 3 months.  If she would like to discuss further please assist with making an appt

## 2015-10-07 ENCOUNTER — Encounter

## 2015-10-07 MED ORDER — METFORMIN 500 MG TAB
500 mg | ORAL_TABLET | Freq: Two times a day (BID) | ORAL | 3 refills | Status: DC
Start: 2015-10-07 — End: 2015-10-09

## 2015-10-07 NOTE — Progress Notes (Signed)
Sent in f/u 3 months

## 2015-10-09 ENCOUNTER — Encounter

## 2015-10-09 MED ORDER — ERGOCALCIFEROL (VITAMIN D2) 50,000 UNIT CAP
1250 mcg (50,000 unit) | ORAL_CAPSULE | ORAL | 5 refills | Status: DC
Start: 2015-10-09 — End: 2016-03-19

## 2015-10-09 MED ORDER — METFORMIN 500 MG TAB
500 mg | ORAL_TABLET | Freq: Two times a day (BID) | ORAL | 3 refills | Status: DC
Start: 2015-10-09 — End: 2015-12-08

## 2015-10-13 NOTE — Progress Notes (Signed)
Fenofibric acid submitted to [facility] via cover my meds.

## 2015-10-13 NOTE — Progress Notes (Signed)
New order sent to [facility]

## 2015-10-13 NOTE — Progress Notes (Addendum)
Per Pharm please write as generic for Tricor or Lopid.

## 2015-10-20 MED ORDER — VENLAFAXINE SR 75 MG 24 HR CAP
75 mg | ORAL_CAPSULE | ORAL | 2 refills | Status: DC
Start: 2015-10-20 — End: 2016-02-15

## 2015-10-21 ENCOUNTER — Ambulatory Visit (INDEPENDENT_AMBULATORY_CARE_PROVIDER_SITE_OTHER): Payer: Medicare Other | Admitting: Gynecology

## 2015-10-21 ENCOUNTER — Encounter: Payer: Self-pay | Admitting: Gynecology

## 2015-10-21 VITALS — BP 124/82 | Ht 64.0 in | Wt 161.0 lb

## 2015-10-21 DIAGNOSIS — N952 Postmenopausal atrophic vaginitis: Secondary | ICD-10-CM | POA: Diagnosis not present

## 2015-10-21 DIAGNOSIS — E559 Vitamin D deficiency, unspecified: Secondary | ICD-10-CM

## 2015-10-21 DIAGNOSIS — Z01419 Encounter for gynecological examination (general) (routine) without abnormal findings: Secondary | ICD-10-CM

## 2015-10-21 NOTE — Progress Notes (Signed)
Jessica Johnston 20-Nov-1940 [id number]   History:    75 y.o.  for annual gyn exam  with no complaints today.  Review of patient's record indicated that in November 2015 she was referred to the GYN oncologist Dr. Charlaine Dalton  As a result of a persistent long-standing simple ovarian cyst with a dimension of 3 x 3 x 2.9 cm along with some small uterine fibroids. Her CA 125 was elevated at 49 and her ROMA-1 Score  Was elevated in the postmenopausal patient. Patient otherwise been asymptomatic an hysterectomy patient was not to repeat any tumor markers any further pelvic ultrasound list the patient became symptomatic. Patient's PCP is Dr. Wellington Hampshire  Who is been doing her blood work. Her last bone density study was last year for which we do not have a result but she does have history of osteopenia on a taking vitamin D. She has had history vitamin D deficiency in the past. She had a normal colonoscopy in 2008. She is currently being followed at [facility] department of dermatology every 6 months as a result of her history of melanoma. Patient  With no past history of abnormal Pap smears.  Past medical history,surgical history, family history and social history were all reviewed and documented in the EPIC chart.  Gynecologic History No LMP recorded. Patient is postmenopausal. Contraception: post menopausal status Last Pap:  Over 6 years ago. Results were: normal Last mammogram:  2015. Results were: normal  Obstetric History OB History  Gravida Para Term Preterm AB SAB TAB Ectopic Multiple Living  3 2   1 1    2     # Outcome Date GA Lbr Len/2nd Weight Sex Delivery Anes PTL Lv  3 SAB           2 Para           1 Para                ROS: A ROS was performed and pertinent positives and negatives are included in the history.  GENERAL: No fevers or chills. HEENT: No change in vision, no earache, sore throat or sinus congestion. NECK: No pain or stiffness. CARDIOVASCULAR: No  chest pain or pressure. No palpitations. PULMONARY: No shortness of breath, cough or wheeze. GASTROINTESTINAL: No abdominal pain, nausea, vomiting or diarrhea, melena or bright red blood per rectum. GENITOURINARY: No urinary frequency, urgency, hesitancy or dysuria. MUSCULOSKELETAL: No joint or muscle pain, no back pain, no recent trauma. DERMATOLOGIC: No rash, no itching, no lesions. ENDOCRINE: No polyuria, polydipsia, no heat or cold intolerance. No recent change in weight. HEMATOLOGICAL: No anemia or easy bruising or bleeding. NEUROLOGIC: No headache, seizures, numbness, tingling or weakness. PSYCHIATRIC: No depression, no loss of interest in normal activity or change in sleep pattern.     Exam: chaperone present  BP 124/82 mmHg  Ht 5\' 4"  (1.626 m)  Wt 161 lb (73.029 kg)  BMI 27.62 kg/m2  Body mass index is 27.62 kg/(m^2).  General appearance : Well developed well nourished female. No acute distress HEENT: Eyes: no retinal hemorrhage or exudates,  Neck supple, trachea midline, no carotid bruits, no thyroidmegaly Lungs: Clear to auscultation, no rhonchi or wheezes, or rib retractions  Heart: Regular rate and rhythm, no murmurs or gallops Breast:Examined in sitting and supine position were symmetrical in appearance, no palpable masses or tenderness,  no skin retraction, no nipple inversion, no nipple discharge, no skin discoloration, no axillary or supraclavicular lymphadenopathy Abdomen: no  palpable masses or tenderness, no rebound or guarding Extremities: no edema or skin discoloration or tenderness  Pelvic:  Bartholin, Urethra, Skene Glands: Within normal limits urethral caruncle             Vagina: No gross lesions or discharge , atrophic changes  Cervix: No gross lesions or discharge  Uterus   anteverted, normal size, shape and consistency, non-tender and mobile  Adnexa  Without masses or tenderness  Anus and perineum  normal   Rectovaginal  normal sphincter tone without palpated  masses or tenderness             Hemoccult  PCP provides     Assessment/Plan:  75 y.o. female for annual exam  With history of simple ovarian cyst was seen by GYN oncologist last year is note ReFlex above. Patient asymptomatic. Patient to have her blood work done by her PCP. Pap smear no longer indicated. She was given a requisition to schedule her overdue mammogram. I requested that she has her PCP to send [place] a copy of her next bone density study. All her vaccines are up-to-date. She is currently on vitamin D which she takes daily.   Terrance Mass MD, 11:21 AM 10/21/2015

## 2015-10-22 ENCOUNTER — Other Ambulatory Visit: Payer: Self-pay

## 2015-10-22 DIAGNOSIS — Z1231 Encounter for screening mammogram for malignant neoplasm of breast: Secondary | ICD-10-CM

## 2015-10-22 MED ORDER — FENOFIBRATE NANOCRYSTALLIZED 145 MG TAB
145 mg | ORAL_TABLET | Freq: Every day | ORAL | 5 refills | Status: DC
Start: 2015-10-22 — End: 2015-11-25

## 2015-10-23 ENCOUNTER — Inpatient Hospital Stay: Admission: RE | Admit: 2015-10-23 | Payer: PRIVATE HEALTH INSURANCE | Source: Ambulatory Visit

## 2015-11-02 DIAGNOSIS — E785 Hyperlipidemia, unspecified: Secondary | ICD-10-CM | POA: Diagnosis not present

## 2015-11-02 DIAGNOSIS — E663 Overweight: Secondary | ICD-10-CM | POA: Diagnosis not present

## 2015-11-02 DIAGNOSIS — F4321 Adjustment disorder with depressed mood: Secondary | ICD-10-CM | POA: Diagnosis not present

## 2015-11-03 DIAGNOSIS — E785 Hyperlipidemia, unspecified: Secondary | ICD-10-CM | POA: Diagnosis not present

## 2015-11-03 DIAGNOSIS — E663 Overweight: Secondary | ICD-10-CM | POA: Diagnosis not present

## 2015-11-03 DIAGNOSIS — F4321 Adjustment disorder with depressed mood: Secondary | ICD-10-CM | POA: Diagnosis not present

## 2015-11-06 DIAGNOSIS — E663 Overweight: Secondary | ICD-10-CM | POA: Diagnosis not present

## 2015-11-06 DIAGNOSIS — E785 Hyperlipidemia, unspecified: Secondary | ICD-10-CM | POA: Diagnosis not present

## 2015-11-12 ENCOUNTER — Ambulatory Visit
Admission: RE | Admit: 2015-11-12 | Discharge: 2015-11-12 | Disposition: A | Discharge status: Home / Self Care | Payer: Medicare Other | Source: Ambulatory Visit

## 2015-11-12 DIAGNOSIS — Z1231 Encounter for screening mammogram for malignant neoplasm of breast: Secondary | ICD-10-CM

## 2015-11-25 ENCOUNTER — Ambulatory Visit: Admit: 2015-11-25 | Discharge: 2015-11-25 | Payer: MEDICARE | Attending: Specialist | Primary: Family Medicine

## 2015-11-25 DIAGNOSIS — C50911 Malignant neoplasm of unspecified site of right female breast: Secondary | ICD-10-CM

## 2015-11-25 NOTE — Progress Notes (Signed)
Dawn Green is a 75 y.o. female here for follow-up of breast cancer.

## 2015-11-25 NOTE — Progress Notes (Signed)
[facility]  Dawn Green, Dawn [place], Dawn [place]   [id number]  W: [phone]   F: [phone]      f/u HEME/ONC CONSULT    Reason for visit: management of breast cancer     Consulting physician:  Dr. Gilford Rile  Referring physician:  Dr. Minette Brine    HPI:   Dawn Green is a 75 y.o.  female who I am seeing in f/u for management of breast cancer.  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  Right lumpectomy on 04/24/13 showed 1.5 cm IDC, gr 2, no LVI, HER 2 negative (ratio 1.2, sig/cell 3), 0/3 LN involved, DCIS present with extensive intraductal component.    Was on estrogen patch for 10 years, stopped at diagnosis.      oncotype Dx = 23, intermediate, RR = 15%, ER + at 9.7, PR + at 7.5; HER 2 negative at 8.7    05/08/13 re-ex negative    S/p XRT from 06/19/13-07/10/13    Started anastrozole 07/17/13    Interval history: complains of gr 2 loss of appetite, gr 2 fatigue, gr 2 hot flashes, gr 1 itching, gr 2 vaginal dryness.    Complains of difficulty with word finding    Complains of mild depression    Normal colonoscopy summer 2015        DX   Encounter Diagnoses   Name Primary?   ??? Malignant neoplasm of right female breast, unspecified site of breast (Dawn Green) Yes   ??? Osteopenia    ??? Hot flash due to medication    ??? Other insomnia    ??? Type 2 diabetes mellitus without complication, without long-term current use of insulin (Dawn Green)    ??? Word finding difficulty    ??? Fatigue, unspecified type    ??? Mild episode of recurrent major depressive disorder Dawn Green)                 Past Medical History   Diagnosis Date   ??? Cancer ([place])      bcc chest, nose, lip, right breast cancer   ??? Hypertension    ??? 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 Other ill-defined conditions(799.89)      glaucoma    ??? Other ill-defined conditions(799.89)      mild URI     Past Surgical History   Procedure Laterality Date   ??? Hx tah and bso  1997     hysterectomy   ??? Hx cataract removal Bilateral      w/ IOL implants   ??? Pr breast surgery procedure unlisted       R breast lumpectomy   ???   05/08/2013     RIGHT BREAST RE-EXCISION LUMPECTOMY performed by Trisha Mangle, MD at [facility]     Social History   ??? Marital status: MARRIED     Spouse name: N/A   ??? Number of children: N/A   ??? Years of education: N/A     Social History Main Topics   ??? Smoking status: Former Smoker     Packs/day: 1.00     Years: 30.00     Quit date: 04/17/1996   ???  Smokeless tobacco: Never Used   ??? Alcohol use 7.0 oz/week     14 Standard drinks or equivalent per week      Comment: 14 gin and tonics/wk   ??? Drug use: No   ??? Sexual activity: Yes     Partners: Male     Birth control/ protection: Surgical     Other Topics Concern   ??? None     Social History Narrative     Family History   Problem Relation Age of Onset   ??? Cancer Mother 12     ovarian   ??? Cancer Son 31     pancreatic   ??? Cancer Paternal Aunt 39     pancreatic       Current Outpatient Prescriptions   Medication Sig Dispense Refill   ??? krill-om-3-dha-epa-phospho-ast (MEGARED OMEGA-3 KRILL OIL) 1,000-230-60 mg cap Take 1 Tab by mouth two (2) times a day.     ??? venlafaxine-SR (EFFEXOR-XR) 75 mg capsule TAKE 1 CAPSULE DAILY 90 Cap 2   ??? ergocalciferol (ERGOCALCIFEROL) 50,000 unit capsule Take 1 Cap by mouth every seven (7) days. 4 Cap 5   ??? metFORMIN (GLUCOPHAGE) 500 mg tablet Take 1 Tab by mouth two (2) times daily (with meals). 60 Tab 3   ??? ipratropium (ATROVENT) 0.03 % nasal spray 2 Sprays by Both Nostrils route every twelve (12) hours. 30 mL 5   ??? alendronate (FOSAMAX) 35 mg tablet Take 1 Tab by mouth every seven (7) days. 12 Tab 3   ??? valsartan-hydrochlorothiazide (DIOVAN-HCT) 80-12.5 mg per tablet Take 1 Tab by mouth daily.      ??? anastrozole (ARIMIDEX) 1 mg tablet TAKE ONE (1) TABLET(S) ONCE DAILY 90 Tab 3   ??? cholecalciferol (VITAMIN D3) 400 unit tab tablet Take 400 Units by mouth daily.     ??? brimonidine-timolol (COMBIGAN) 0.2-0.5 % drop ophthalmic solution Administer 1 Drop to both eyes every twelve (12) hours.     ??? atorvastatin (LIPITOR) 40 mg tablet Take 40 mg by mouth Daily (before breakfast).     ??? aspirin 81 mg chewable tablet Take 81 mg by mouth daily.         No Known Allergies    Review of Systems    A comprehensive review of systems was performed and all systems were negative except for HPI and for the symptom report form, reviewed and scanned in.        Objective:  Physical Exam:  Visit Vitals   ??? BP 126/61   ??? Pulse 72   ??? Temp 97.1 ??F (36.2 ??C) (Temporal)   ??? Resp 18   ??? Ht '5\' 3"'  (1.6 m)   ??? Wt 160 lb 9.6 oz (72.8 kg)   ??? SpO2 95%   ??? BMI 28.45 kg/m2       General:  Alert, cooperative, no distress, appears stated age.   Head:  Normocephalic, without obvious abnormality, atraumatic.   Eyes:  Conjunctivae/corneas clear. PERRL, EOMs intact.   Throat: Lips, mucosa, and tongue normal.    Neck: Supple, symmetrical, trachea midline, no adenopathy, thyroid: no enlargement/tenderness/nodules   Back:   Symmetric, no curvature. ROM normal. No CVA tenderness.   Lungs:   Clear to auscultation bilaterally.       Heart:  Regular rate and rhythm, S1, S2 normal, no murmur, click, rub or gallop.   Abdomen:   Soft, non-tender. Bowel sounds normal. No masses,  No organomegaly.   Extremities: Extremities normal, atraumatic, no cyanosis or  edema.   Skin: Skin color, texture, turgor normal. No rashes or lesions.   Lymph nodes: Cervical, supraclavicular, and axillary nodes normal.   Neurologic: CNII-XII intact.                                                                     Diagnostic Imaging   No results found for this or any previous visit.    No results found for this or any previous visit.    05/22/13 dexa  Lumbar spine: L1-4    Bone mineral density (gm/cm2): 1.325   % of peak bone mass: 111   % for age matched controls: 131   T score: 1.1   Z score: 2.6   Hip: Right femoral neck   Bone mineral density (gm/cm2): 0.927   % of peak bone mass: 89   % for age matched controls: 50   T score: -0.8   Z score: 0.8   IMPRESSION:   This patient is normal using the World Health Organization criteria   As compared to the prior study, there has been no significant change   10 year probability of major osteoporotic fracture: 8.7%   10 year probability of hip fracture: 0.9%    02/06/15 Bilateral Mammogram  Negative    06/05/15 dexa  Findings:  ??  Femoral Neck: Right  Bone mineral density (gm/cm2):? 0.863  % of peak bone mass: 83  % for age matched controls:? 108  T-score: -1.3  Z-score: 0.5  ??  Total Hip: Right  Bone mineral density (gm/cm2): 0.972  % of peak bone mass: 96  % for age matched controls: 119  T-score: -0.3  Z-score: 1.2  ??  Lumbar Spine: L1-4  Bone mineral density (gm/cm2): 1.146  % of peak bone mass: 96   % for age matched controls: 114  T-score: -0.4  Z-score: 1.1  ??  T score of the distal one third left radius 0.1  ????  ??  IMPRESSION:  ??  This patient is osteopenic using the World Health Organization criteria  As compared to the prior study, there has been a significant 5.2% decrease   in the mean hip and 13.5% decrease in the lumbar spine. Please assess   calcium and vitamin D intake. Please consider secondary causes for   osteoporosis.  10 year probability of major osteoporotic fracture: 10.1%  10 year probability of hip fracture: 1.6%    Lab Results  Lab Results   Component Value Date/Time    WBC 4.8 10/02/2015 09:55 AM    HGB 12.6 10/02/2015 09:55 AM    HCT 37.3 10/02/2015 09:55 AM    PLATELET 299 10/02/2015 09:55 AM    MCV 92 10/02/2015 09:55 AM       Lab Results   Component Value Date/Time    Sodium 142 10/02/2015 09:55 AM    Potassium 4.2 10/02/2015 09:55 AM    Chloride 101 10/02/2015 09:55 AM    CO2 27 10/02/2015 09:55 AM     Anion gap 6 04/17/2013 11:01 AM    Glucose 139 10/02/2015 09:55 AM    BUN 18 10/02/2015 09:55 AM    Creatinine 0.52 10/02/2015 09:55 AM    BUN/Creatinine ratio 35 10/02/2015 09:55 AM  GFR est AA 109 10/02/2015 09:55 AM    GFR est non-AA 94 10/02/2015 09:55 AM    Calcium 9.1 10/02/2015 09:55 AM    AST (SGOT) 17 10/02/2015 09:55 AM    Alk. phosphatase 62 10/02/2015 09:55 AM    Protein, total 6.6 10/02/2015 09:55 AM    Albumin 4.0 10/02/2015 09:55 AM    A-G Ratio 1.5 10/02/2015 09:55 AM    ALT (SGPT) 22 10/02/2015 09:55 AM       .    Assessment/Plan:  75 y.o. 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  PS 0    1. Breast cancer stage: IA    Hormonal therapy: administered    No evidence of recurrence, continue anastrozole.    Mammogram April 2017, ordered    2. Hot flashes:  stable, currently taking Effexor 75 mg daily in the AM.  Did not tolerate gabapentin due to oversleeping    3. Insomnia: stable, decided to not try [place]; drinking a glass of milk at night    4. Osteopenia:  Started fosamax 05/2015; taking vit D 50,000 int units weekly    5. DM2:  New, started on metformin    6. Fatigue:  May be due to anastrozole, DM2, will monitor, she will call me if it worsens; discussed exercising, she has just bought a gym membership; may be due to worsening depresssion    7. Word finding difficulty:  Will refer to cancer rehab    8. Mild depression, major disorder:  Will increase effexor XR to 150 mg daily    Thank you for this consult.  All of the patient's questions were answered today.          There are no Patient Instructions on file for this visit.   Follow-up Disposition:  Return in about 6 months (around 05/24/2016).    Sonda Rumble MD

## 2015-12-08 ENCOUNTER — Encounter

## 2015-12-08 NOTE — Telephone Encounter (Signed)
Patient called requesting a return call from [facility]. Call back number [phone]

## 2015-12-08 NOTE — Telephone Encounter (Signed)
Called the patient and verified ID x 2.  The patient stated that Dr. Laurena Bering had referred her to [facility], [facility], [facility] and thought she was a physical therapist that dealt with just physical injuries but realized that [facility] works with oncology patients who have memory issues due to chemotherapy as well.  Informed the patient that this office was just informed that they do not have a staff member to care for patients whose memory has been affected by chemotherapy and that Dr. Laurena Bering also refers patients to Dr. Lacey Jensen at [facility].  The patient stated that she will await a call from [facility] and that she will call Dr. Valinda Hoar office back if necessary.  The patient verbalized understanding and denied any further questions or concerns.

## 2015-12-09 MED ORDER — METFORMIN 500 MG TAB
500 mg | ORAL_TABLET | Freq: Two times a day (BID) | ORAL | 3 refills | Status: DC
Start: 2015-12-09 — End: 2016-05-25

## 2015-12-10 ENCOUNTER — Inpatient Hospital Stay: Payer: MEDICARE | Attending: Rehabilitative and Restorative Service Providers" | Primary: Family Medicine

## 2015-12-18 DIAGNOSIS — Z6826 Body mass index (BMI) 26.0-26.9, adult: Secondary | ICD-10-CM | POA: Diagnosis not present

## 2015-12-18 DIAGNOSIS — R1031 Right lower quadrant pain: Secondary | ICD-10-CM | POA: Diagnosis not present

## 2015-12-18 DIAGNOSIS — R1011 Right upper quadrant pain: Secondary | ICD-10-CM | POA: Diagnosis not present

## 2015-12-22 ENCOUNTER — Other Ambulatory Visit: Payer: Self-pay | Admitting: Internal Medicine

## 2015-12-22 ENCOUNTER — Other Ambulatory Visit: Payer: Self-pay | Admitting: Registered Nurse

## 2015-12-22 DIAGNOSIS — R1011 Right upper quadrant pain: Secondary | ICD-10-CM

## 2015-12-22 NOTE — Telephone Encounter (Signed)
Called patient and left a voicemail requesting a return call from the patient. Awaiting her call.

## 2015-12-22 NOTE — Telephone Encounter (Signed)
Patient left VM stating she was returning [employer]. She stated that she would be home for a few more minutes if she wanted to call back. If she wasn't home, she said please leave a message. CB# [phone].

## 2015-12-23 NOTE — Telephone Encounter (Signed)
Called patient back and spoke with her spouse to ask pt is she would like for [place] to schedule her with Dr. Lacey Jensen at [facility] or Dr. Novella Rob with [facility] and he stated she feels as though she does not need to be scheduled but he will ask her and get pt to call our office back with her final decision. Awaiting patients call back.

## 2015-12-28 ENCOUNTER — Other Ambulatory Visit: Payer: Medicare Other

## 2015-12-29 DIAGNOSIS — F4322 Adjustment disorder with anxiety: Secondary | ICD-10-CM | POA: Diagnosis not present

## 2015-12-30 ENCOUNTER — Inpatient Hospital Stay: Admit: 2016-02-04 | Payer: MEDICARE | Primary: Family Medicine

## 2015-12-30 ENCOUNTER — Ambulatory Visit: Admit: 2015-12-30 | Discharge: 2015-12-30 | Payer: MEDICARE | Attending: Family Medicine | Primary: Family Medicine

## 2015-12-30 ENCOUNTER — Encounter

## 2015-12-30 DIAGNOSIS — E119 Type 2 diabetes mellitus without complications: Secondary | ICD-10-CM

## 2015-12-30 MED ORDER — ANASTROZOLE 1 MG TAB
1 mg | ORAL_TABLET | ORAL | 3 refills | Status: DC
Start: 2015-12-30 — End: 2016-07-24

## 2015-12-30 MED ORDER — ALENDRONATE 35 MG TAB
35 mg | ORAL_TABLET | ORAL | 3 refills | Status: DC
Start: 2015-12-30 — End: 2016-12-28

## 2015-12-30 NOTE — Progress Notes (Signed)
Dawn Green is a 75 y.o. female   Chief Complaint   Patient presents with   ??? Labs   ??? Memory Loss   ??? Swelling     left ankle     pt here for med checks with hyper trigs and is taking her krill oil without any issues.   No issues with metformin and would like to astop this if possible.  Pt has been watching her diet but craving sugars and has been controlling this./  Pt is fasting currently.  Pt would like 90 day Rx for metformin if she is going to continue it, this will be determined by labs.    Pt also reports that she is having a little bit of L ankle swelling  No pain no edema.      Pt also reports that she is having issues with remembering words, was sent to cancer rehab but was advised by [facility] that they won't see her, pt not sure why.  Sister does have dementia.  Denies any issues with getting lost going to familiar places.  Clock test normal      she is a 75 y.o. year old female who presents for evalution.      Reviewed PmHx, RxHx, FmHx, SocHx, AllgHx and updated and dated in the chart.    Review of Systems - negative except as listed above in the HPI    Objective:     Vitals:    12/30/15 1017   BP: 127/54   Pulse: 75   Resp: 20   Temp: 97.9 ??F (36.6 ??C)   TempSrc: Oral   SpO2: 97%   Weight: 160 lb (72.6 kg)   Height: '5\' 3"'$  (1.6 m)       Current Outpatient Prescriptions   Medication Sig   ??? anastrozole (ARIMIDEX) 1 mg tablet TAKE 1 TABLET ONCE DAILY   ??? metFORMIN (GLUCOPHAGE) 500 mg tablet Take 1 Tab by mouth two (2) times daily (with meals).   ??? krill-om-3-dha-epa-phospho-ast (MEGARED OMEGA-3 KRILL OIL) 1,000-230-60 mg cap Take 1 Tab by mouth two (2) times a day.   ??? venlafaxine-SR (EFFEXOR-XR) 75 mg capsule TAKE 1 CAPSULE DAILY   ??? ergocalciferol (ERGOCALCIFEROL) 50,000 unit capsule Take 1 Cap by mouth every seven (7) days.   ??? ipratropium (ATROVENT) 0.03 % nasal spray 2 Sprays by Both Nostrils route every twelve (12) hours. (Patient taking differently: 2 Sprays by Both Nostrils route as needed.)    ??? alendronate (FOSAMAX) 35 mg tablet Take 1 Tab by mouth every seven (7) days.   ??? valsartan-hydrochlorothiazide (DIOVAN-HCT) 80-12.5 mg per tablet Take 1 Tab by mouth daily.   ??? cholecalciferol (VITAMIN D3) 400 unit tab tablet Take 400 Units by mouth daily.   ??? brimonidine-timolol (COMBIGAN) 0.2-0.5 % drop ophthalmic solution Administer 1 Drop to both eyes every twelve (12) hours.   ??? atorvastatin (LIPITOR) 40 mg tablet Take 40 mg by mouth Daily (before breakfast).   ??? aspirin 81 mg chewable tablet Take 81 mg by mouth daily.     No current facility-administered medications for this visit.        Physical Examination: General appearance - alert, well appearing, and in no distress  Eyes - pupils equal and reactive, extraocular eye movements intact  Chest - clear to auscultation, no wheezes, rales or rhonchi, symmetric air entry  Heart - normal rate, regular rhythm, normal S1, S2, no murmurs, rubs, clicks or gallops  Neurological - alert, oriented, normal speech, no focal findings  or movement disorder noted  Extremities - peripheral pulses normal, no pedal edema, no clubbing or cyanosis  Skin - normal coloration and turgor, no rashes, no suspicious skin lesions noted    Abbreviated MMSE normal, missed no points  Assessment/ Plan:   Dawn Green was seen today for labs, memory loss and swelling.    Diagnoses and all orders for this visit:    Controlled type 2 diabetes mellitus without complication, without long-term current use of insulin (HCC)  -     LIPID PANEL  -     METABOLIC PANEL, COMPREHENSIVE  -     HEMOGLOBIN A1C WITH EAG  -     MICROALBUMIN, UR, RAND W/ MICROALBUMIN/CREA RATIO    Hypertriglyceridemia  -     LIPID PANEL  -     METABOLIC PANEL, COMPREHENSIVE  -     HEMOGLOBIN A1C WITH EAG  -     MICROALBUMIN, UR, RAND W/ MICROALBUMIN/CREA RATIO    Memory loss  Do not suspoect alzheimer at this point or dementia in general.  Will monitor   discussed word exercises and crosswords to help with cerebral reserve as well   Follow-up Disposition:  Return if symptoms worsen or fail to improve.    I have discussed the diagnosis with the patient and the intended plan as seen in the above orders.  The patient has received an after-visit summary and questions were answered concerning future plans. Pt conveyed understanding of plan.    Medication Side Effects and Warnings were discussed with patient      Esau Grew, DO

## 2015-12-30 NOTE — Progress Notes (Signed)
Chief Complaint   Patient presents with   ??? Labs   ??? Memory Loss   ??? Swelling     left ankle      Patient here for fasting lab work-voiced she has fasted. Reports memory problems x a few months and Left ankle swelling x 3 weeks denies pain to left ankle or discoloration.

## 2015-12-30 NOTE — Telephone Encounter (Signed)
From: [name]  To: Joyce Gross, MD  Sent: 12/30/2015 12:47 PM EDT  Subject:  Prescription Question    Dr.  Laurena Bering.Marland KitchenMarland KitchenI'm in need of another Fosamax Rx. Please make for 90 days. Thank you! Hope you're having a good day!   Dawn Green  [birth year]  [phone]

## 2015-12-31 LAB — METABOLIC PANEL, COMPREHENSIVE
A-G Ratio: 1.8 (ref 1.2–2.2)
ALT (SGPT): 17 IU/L (ref 0–32)
AST (SGOT): 12 IU/L (ref 0–40)
Albumin: 4.2 g/dL (ref 3.5–4.8)
Alk. phosphatase: 57 IU/L (ref 39–117)
BUN/Creatinine ratio: 28 — ABNORMAL HIGH (ref 11–26)
BUN: 15 mg/dL (ref 8–27)
Bilirubin, total: 0.4 mg/dL (ref 0.0–1.2)
CO2: 26 mmol/L (ref 18–29)
Calcium: 9.1 mg/dL (ref 8.7–10.3)
Chloride: 100 mmol/L (ref 96–106)
Creatinine: 0.53 mg/dL — ABNORMAL LOW (ref 0.57–1.00)
GFR est AA: 108 mL/min/{1.73_m2} (ref >59)
GFR est non-AA: 94 mL/min/{1.73_m2} (ref >59)
GLOBULIN, TOTAL: 2.4 g/dL (ref 1.5–4.5)
Glucose: 129 mg/dL — ABNORMAL HIGH (ref 65–99)
Potassium: 4.2 mmol/L (ref 3.5–5.2)
Protein, total: 6.6 g/dL (ref 6.0–8.5)
Sodium: 142 mmol/L (ref 134–144)

## 2015-12-31 LAB — LIPID PANEL
Cholesterol, total: 176 mg/dL (ref 100–199)
HDL Cholesterol: 41 mg/dL (ref >39)
Triglyceride: 427 mg/dL — ABNORMAL HIGH (ref 0–149)

## 2015-12-31 LAB — HEMOGLOBIN A1C WITH EAG
Estimated average glucose: 128 mg/dL
Hemoglobin A1c: 6.1 % — ABNORMAL HIGH (ref 4.8–5.6)

## 2015-12-31 LAB — MICROALBUMIN, UR, RAND W/ MICROALB/CREAT RATIO
Creatinine, urine random: 97.8 mg/dL
Microalb/Creat ratio (ug/mg creat.): 26 mg/g creat (ref 0.0–30.0)
Microalbumin, urine: 25.4 ug/mL

## 2015-12-31 LAB — CVD REPORT

## 2015-12-31 LAB — DIABETES PATIENT EDUCATION

## 2016-01-01 NOTE — Progress Notes (Signed)
Your diabetes has improved.  We can do a 3 month trial without the metformin.  Also your triglycerides are better but still high.  Get an OTC fish oil or krill oil capsule and take this daily.  We should recheck all of these labs in 3 months.  Let me know if you have any questions

## 2016-01-05 ENCOUNTER — Encounter

## 2016-01-05 MED ORDER — ATORVASTATIN 40 MG TAB
40 mg | ORAL_TABLET | Freq: Every day | ORAL | 3 refills | Status: DC
Start: 2016-01-05 — End: 2016-07-24

## 2016-01-05 MED ORDER — VALSARTAN-HYDROCHLOROTHIAZIDE 80 MG-12.5 MG TAB
ORAL_TABLET | Freq: Every day | ORAL | 3 refills | Status: DC
Start: 2016-01-05 — End: 2016-07-24

## 2016-01-21 DIAGNOSIS — E784 Other hyperlipidemia: Secondary | ICD-10-CM | POA: Diagnosis not present

## 2016-01-21 DIAGNOSIS — Z6826 Body mass index (BMI) 26.0-26.9, adult: Secondary | ICD-10-CM | POA: Diagnosis not present

## 2016-01-21 DIAGNOSIS — F4322 Adjustment disorder with anxiety: Secondary | ICD-10-CM | POA: Diagnosis not present

## 2016-01-21 DIAGNOSIS — R1011 Right upper quadrant pain: Secondary | ICD-10-CM | POA: Diagnosis not present

## 2016-01-25 ENCOUNTER — Other Ambulatory Visit: Payer: Self-pay | Admitting: Internal Medicine

## 2016-01-25 ENCOUNTER — Other Ambulatory Visit (HOSPITAL_COMMUNITY): Payer: Self-pay | Admitting: Internal Medicine

## 2016-01-25 DIAGNOSIS — R1011 Right upper quadrant pain: Secondary | ICD-10-CM

## 2016-01-27 ENCOUNTER — Ambulatory Visit (HOSPITAL_COMMUNITY)
Admission: RE | Admit: 2016-01-27 | Discharge: 2016-01-27 | Disposition: A | Discharge status: Home / Self Care | Payer: Medicare Other | Source: Ambulatory Visit | Attending: Internal Medicine | Admitting: Internal Medicine

## 2016-01-27 ENCOUNTER — Encounter (HOSPITAL_COMMUNITY): Payer: Self-pay

## 2016-01-27 DIAGNOSIS — R109 Unspecified abdominal pain: Secondary | ICD-10-CM | POA: Diagnosis not present

## 2016-01-27 DIAGNOSIS — R1909 Other intra-abdominal and pelvic swelling, mass and lump: Secondary | ICD-10-CM | POA: Insufficient documentation

## 2016-01-27 DIAGNOSIS — R1011 Right upper quadrant pain: Secondary | ICD-10-CM

## 2016-01-27 MED ORDER — IOPAMIDOL (ISOVUE-300) INJECTION 61%
100.0000 mL | Freq: Once | INTRAVENOUS | Status: AC | PRN
  Administered 2016-01-27: 100 mL via INTRAVENOUS

## 2016-01-28 ENCOUNTER — Telehealth: Payer: Self-pay | Admitting: *Deleted

## 2016-01-28 NOTE — Telephone Encounter (Signed)
-----   Message from Ramond Craver, [place] sent at 01/28/2016 10:30 AM EDT ----- Regarding: refer to gyn onc Needs appt with Dr. Denman George per Dr. Moshe Salisbury. Previously seen by her in 2015. Dr. Dagmar Hait covering for Dr. Joylene Draft ordered CT scan that showed mass in abdomen.  Dr. Moshe Salisbury said she needs to be referred back to Dr. Denman George for this.. He said Dr. Dagmar Hait will send note.

## 2016-01-28 NOTE — Telephone Encounter (Signed)
Appointment on 02/15/16 @ 9:45pm with Dr.Rossi, pt aware

## 2016-02-01 ENCOUNTER — Other Ambulatory Visit: Payer: Medicare Other

## 2016-02-08 ENCOUNTER — Encounter: Payer: Self-pay | Admitting: Gynecologic Oncology

## 2016-02-08 ENCOUNTER — Other Ambulatory Visit (HOSPITAL_BASED_OUTPATIENT_CLINIC_OR_DEPARTMENT_OTHER): Payer: Medicare Other

## 2016-02-08 ENCOUNTER — Ambulatory Visit: Payer: Medicare Other | Attending: Gynecologic Oncology | Admitting: Gynecologic Oncology

## 2016-02-08 ENCOUNTER — Inpatient Hospital Stay: Admit: 2016-02-08 | Payer: MEDICARE | Attending: Specialist | Primary: Family Medicine

## 2016-02-08 VITALS — BP 123/59 | HR 79 | Temp 98.2°F | Resp 18 | Ht 64.0 in | Wt 151.1 lb

## 2016-02-08 DIAGNOSIS — C50911 Malignant neoplasm of unspecified site of right female breast: Secondary | ICD-10-CM

## 2016-02-08 DIAGNOSIS — Z85820 Personal history of malignant melanoma of skin: Secondary | ICD-10-CM

## 2016-02-08 DIAGNOSIS — K668 Other specified disorders of peritoneum: Secondary | ICD-10-CM

## 2016-02-08 DIAGNOSIS — R1909 Other intra-abdominal and pelvic swelling, mass and lump: Secondary | ICD-10-CM | POA: Insufficient documentation

## 2016-02-08 DIAGNOSIS — N83202 Unspecified ovarian cyst, left side: Secondary | ICD-10-CM | POA: Diagnosis not present

## 2016-02-08 DIAGNOSIS — Z7982 Long term (current) use of aspirin: Secondary | ICD-10-CM | POA: Insufficient documentation

## 2016-02-08 DIAGNOSIS — E7800 Pure hypercholesterolemia, unspecified: Secondary | ICD-10-CM | POA: Diagnosis not present

## 2016-02-08 LAB — COMPREHENSIVE METABOLIC PANEL
ALT: 55 U/L (ref 0–55)
ANION GAP: 8 meq/L (ref 3–11)
AST: 33 U/L (ref 5–34)
Albumin: 3.6 g/dL (ref 3.5–5.0)
Alkaline Phosphatase: 97 U/L (ref 40–150)
BUN: 10.9 mg/dL (ref 7.0–26.0)
CHLORIDE: 107 meq/L (ref 98–109)
CO2: 26 meq/L (ref 22–29)
Calcium: 9.5 mg/dL (ref 8.4–10.4)
Creatinine: 0.7 mg/dL (ref 0.6–1.1)
EGFR: 85 mL/min/{1.73_m2} — AB (ref >90)
GLUCOSE: 102 mg/dL (ref 70–140)
Potassium: 4.5 mEq/L (ref 3.5–5.1)
SODIUM: 141 meq/L (ref 136–145)
Total Bilirubin: <0.3 mg/dL (ref 0.20–1.20)
Total Protein: 7 g/dL (ref 6.4–8.3)

## 2016-02-08 NOTE — Progress Notes (Signed)
Consult Note: Gyn-Onc  Consult was requested by Dr. Toney Rakes for the evaluation of Jessica Johnston 75 y.o. female  CC:  Chief Complaint  Patient presents with  . carcinomatosis    Assessment/Plan:  Jessica Johnston  is a 74 y.o.  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  I reviewed her CT images with the patient. 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  Due to the atypical presentation on imaging (omental cake without ascites, pelvic masses) I am first recommending confirmatory biopsy of the omental mass.  Presuming gyn primary is determined, we will plan for treatment of advanced ovarian cancer.I discussed that the treatment approach for this disease is typically combination of cytoreductive surgery and chemotherapy. I discussed that sequencing of this can be either with upfront debulking followed by adjuvant chemotherapy sequentially or neoadjuvant chemotherapy followed by an interval cytoreductive attempt, then additional chemotherapy. This latter approach is associated with a reduced perioperative morbidity at the time of surgery. I discussed that the goal of optimal sequencing is to optimise the likelihood that cytoreductive effect can be optimal to less than 1 cm of residual disease, and would not induce morbidity for the patient that would result in a delay of adjuvant chemotherapy. I discussed that it is an individual decision process that takes into account individual patient health, and preference factors, in addition to the apparent tumor distribution on imaging. I discussed that the overall survival observed in patients is equivalent for both approaches provided that there is an optimal cytoreductive effort at the time of surgery (regardless of the timing of that surgery).  Given her relatively low tumor bulk, and no apparent signs of  unresectable disease (to optimal status) and her excellent performance status, I am recommending upfront surgical cytoreduction. The patient agrees to this.  I have scheduled her for an exploratory laparotomy total abdominal hysterectomy, omentectomy, BSO, radical tumor debulking, possible bowel resection. We will assess nutritional status with labs preoperatively. She will require pre-and postoperative prophylactic Lovenox dosing due to her increased risk for VTE events with ovarian cancer an exploratory laparotomy.  I discussed surgical risks with the patient including  bleeding, infection, damage to internal organs (such as bladder,ureters, bowels), blood clot, reoperation and rehospitalization. I discussed anticipated hospital stay and recovery. I discussed that surgery alone is not curative for advanced primary peritoneal ovarian or fallopian tube cancer, but instead chemotherapy is essential to partner with a surgical effort. I discussed that surgery and is complications made to lay starting chemotherapy.  If this is primary peritoneal/ovarian/fallopian tube cancer she will require genetic testing.  HPI: Jessica Johnston is a 75 year old para's woman who is seen in consultation at the request of Dr. Toney Rakes for abdominal peritoneal carcinomatosis and omental caking. The patient has a known history of a 3 cm left ovarian cyst for which she saw my partner approximately 15 months ago. At that time her CA-125 is mildly elevated to 49 in her Roma 1 score was also elevated. However the left ovarian cyst was unilocular, measured only 3 cm, and was long-standing, and therefore suspicion for occult malignancy was low.  In November 2016 she began feeling central and upper abdominal discomfort. Initially she thought it was part of the grieving process she lost her grandson. However when the pain and discomfort persisted she sought evaluation by her providers. A gynecologic evaluation was unremarkable.  However  eventually she underwent CT scan imaging on 01/27/2016 for this persistent midabdominal pain. CT imaging showed a liver containing a few small low-density lesions which were equivocal for the potential of metastatic disease these were subcentimeter in dimension. There was an extensive omental nodularity which was highly worrisome for peritoneal carcinomatosis. A 3.5 cm stable left adnexal simple cyst was identified. This was not typical for ovarian cancer. There were no other obvious signs of metastatic disease including no gross ascites.  The patient is otherwise very healthy. 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 She is not required lymphadenectomy for any of these melanoma lesions. She is no family history significant for breast or ovarian cancer.   Current Meds:  Outpatient Encounter Prescriptions as of 02/08/2016  Medication Sig  . acetaminophen (TYLENOL) 325 MG tablet Take by mouth.  Marland Kitchen aspirin 81 MG tablet Take 81 mg by mouth daily.  . cholecalciferol (VITAMIN D) 1000 UNITS tablet Take 1,000 Units by mouth daily.  Marland Kitchen ezetimibe (ZETIA) 10 MG tablet Take by mouth.  . Pitavastatin Calcium (LIVALO) 2 MG TABS Take by mouth.  . [DISCONTINUED] Efinaconazole 10 % SOLN Apply 1 drop topically daily.  . [DISCONTINUED] NONFORMULARY OR COMPOUNDED ITEM Estradiol 0.02 % 8ml prefilled applicator Sig: apply twice a week (Patient not taking: Reported on 10/21/2015)  . [DISCONTINUED] rosuvastatin (CRESTOR) 20 MG tablet Take 20 mg by mouth daily. Reported on 10/21/2015   No facility-administered encounter medications on file as of 02/08/2016.    Allergy: No Known Allergies  Social Hx:   Social History   Social History  . Marital Status: Married    Spouse Name: N/A  . Number of Children: N/A  . Years of Education: N/A   Occupational History  . Not on file.   Social History Main Topics  . Smoking status: Never Smoker    . Smokeless tobacco: Never Used  . Alcohol Use: 0.0 oz/week    0 Standard drinks or equivalent per week  . Drug Use: No  . Sexual Activity: No   Other Topics Concern  . Not on file   Social History Narrative    Past Surgical Hx:  Past Surgical History  Procedure Laterality Date  . Tonsillectomy and adenoidectomy  1948  . Hysteroscopy      Past Medical Hx:  Past Medical History  Diagnosis Date  . High cholesterol   . Cancer ([place])     MELANOMA    Past Gynecological History:  SVD x 2  No LMP recorded. Patient is postmenopausal.  Family Hx: History reviewed. No pertinent family history.  Review of Systems:  Constitutional  Feels well,    ENT Normal appearing ears and nares bilaterally Skin/Breast  No rash, sores, jaundice, itching, dryness Cardiovascular  No chest pain, shortness of breath, or edema  Pulmonary  No cough or wheeze.  Gastro Intestinal  + abdominal pain, +early satiety, no change in bowel habit Genito Urinary  No frequency, urgency, dysuria, no bleeding Musculo Skeletal  No myalgia, arthralgia, joint swelling or pain  Neurologic  No weakness, numbness, change in gait,  Psychology  No depression, anxiety, insomnia.   Vitals:  Blood pressure 123/59, pulse 79, temperature 98.2 F (36.8 C), temperature source Oral, resp. rate 18, height 5\' 4"  (1.626 m), weight 151 lb 1.6 oz (68.539 kg), SpO2 99 %.  Physical Exam: WD in NAD Neck  Supple NROM, without  any enlargements.  Lymph Node Survey No cervical supraclavicular or inguinal adenopathy Cardiovascular  Pulse normal rate, regularity and rhythm. S1 and S2 normal.  Lungs  Clear to auscultation bilateraly, without wheezes/crackles/rhonchi. Good air movement.  Skin  No rash/lesions/breakdown  Psychiatry  Alert and oriented to person, place, and time  Abdomen  Normoactive bowel sounds, abdomen soft, non-tender and mildly overweight (BMI 26) without evidence of hernia.  Back No CVA  tenderness Genito Urinary  Vulva/vagina: Normal external female genitalia.  No lesions. No discharge or bleeding.  Bladder/urethra:  No lesions or masses, well supported bladder  Vagina: normal  Cervix: Normal appearing, no lesions.  Uterus:  Small, mobile, no parametrial involvement or nodularity.  Adnexa: no palpable masses. Rectal  Good tone, no masses no cul de sac nodularity.  Extremities  No bilateral cyanosis, clubbing or edema.   Donaciano Eva, MD  02/08/2016, 11:47 AM

## 2016-02-08 NOTE — Patient Instructions (Signed)
Preparing for your Surgery  Plan for surgery on May 4 with Dr. Everitt Amber at [facility] will be scheduled for an exploratory laparotomy (open incision on your abdomen), total abdominal hysterectomy, bilateral salpingo-oophorectomy, debulking, possible bowel resection.  Pre-operative Testing -You will receive a phone call from presurgical testing at [facility] to arrange for a pre-operative testing appointment before your surgery.  This appointment normally occurs one to two weeks before your scheduled surgery.   -Bring your insurance card, copy of an advanced directive if applicable, medication list  -At that visit, you will be asked to sign a consent for a possible blood transfusion in case a transfusion becomes necessary during surgery.  The need for a blood transfusion is rare but having consent is a necessary part of your care.     -You should not be taking blood thinners or aspirin at least ten days prior to surgery unless instructed by your surgeon.  -You will be on lovenox injections once daily for a total of 28 days after surgery to prevent blood clots.  Day Before Surgery at [facility] will be asked to take in a full liquid diet.  You will have nothing to eat or drink after midnight.  You will need to drink two bottles of magnesium citrate the day before surgery starting at 9-10 am to clean out your bowels for surgery. Your role in recovery Your role is to become active as soon as directed by your doctor, while still giving yourself time to heal.  Rest when you feel tired. You will be asked to do the following in order to speed your recovery:  - Cough and breathe deeply. This helps toclear and expand your lungs and can prevent pneumonia. You may be given a spirometer to practice deep breathing. A staff member will show you how to use the spirometer. - Do mild physical activity. Walking or moving your legs help your circulation and body functions  return to normal. A staff member will help you when you try to walk and will provide you with simple exercises. Do not try to get up or walk alone the first time. - Actively manage your pain. Managing your pain lets you move in comfort. We will ask you to rate your pain on a scale of zero to 10. It is your responsibility to tell your doctor or nurse where and how much you hurt so your pain can be treated.  Special Considerations -If you are diabetic, you may be placed on insulin after surgery to have closer control over your blood sugars to promote healing and recovery.  This does not mean that you will be discharged on insulin.  If applicable, your oral antidiabetics will be resumed when you are tolerating a solid diet.  -Your final pathology results from surgery should be available by the Friday after surgery and the results will be relayed to you when available.  Blood Transfusion Information WHAT IS A BLOOD TRANSFUSION? A transfusion is the replacement of blood or some of its parts. Blood is made up of multiple cells which provide different functions. 1. Red blood cells carry oxygen and are used for blood loss replacement. 2. White blood cells fight against infection. 3. Platelets control bleeding. 4. Plasma helps clot blood. 5. Other blood products are available for specialized needs, such as hemophilia or other clotting disorders. BEFORE THE TRANSFUSION  Who gives blood for transfusions?   You may be able to donate blood to be  used at a later date on yourself (autologous donation).  Relatives can be asked to donate blood. This is generally not any safer than if you have received blood from a stranger. The same precautions are taken to ensure safety when a relative's blood is donated.  Healthy volunteers who are fully evaluated to make sure their blood is safe. This is blood bank blood. Transfusion therapy is the safest it has ever been in the practice of medicine. Before blood is taken  from a donor, a complete history is taken to make sure that person has no history of diseases nor engages in risky social behavior (examples are intravenous drug use or sexual activity with multiple partners). The donor's travel history is screened to minimize risk of transmitting infections, such as malaria. The donated blood is tested for signs of infectious diseases, such as HIV and hepatitis. The blood is then tested to be sure it is compatible with you in order to minimize the chance of a transfusion reaction. If you or a relative donates blood, this is often done in anticipation of surgery and is not appropriate for emergency situations. It takes many days to process the donated blood. RISKS AND COMPLICATIONS Although transfusion therapy is very safe and saves many lives, the main dangers of transfusion include:   Getting an infectious disease.  Developing a transfusion reaction. This is an allergic reaction to something in the blood you were given. Every precaution is taken to prevent this. The decision to have a blood transfusion has been considered carefully by your caregiver before blood is given. Blood is not given unless the benefits outweigh the risks. WHAT DO I NEED TO KNOW ABOUT A FULL LIQUID DIET?  You may have any liquid.  You may have any food that becomes a liquid at room temperature. The food is considered a liquid if it can be poured off a spoon at room temperature. WHAT FOODS CAN I EAT? Grains Any grain food that can be pureed in soup (such as crackers, pasta, and rice). Hot cereal (such as farina or oatmeal) that has been blended.  Vegetables Pulp-free tomato or vegetable juice. Vegetables pureed in soup.  Fruits Fruit juice, including nectars and juices with pulp. Meats and Other Protein Sources Eggs in custard, eggnog mix, and eggs used in ice cream or pudding. Strained meats, like in baby food, may be allowed. Consult your health care provider.  Dairy Milk and  milk-based beverages, including milk shakes and instant breakfast mixes. Smooth yogurt. Pureed cottage cheese. Avoid these foods if they are not well tolerated. Beverages All beverages, including liquid nutritional supplements. Ask your health care provider if you can have carbonated beverages. They may not be well tolerated. Condiments Iodized salt, pepper, spices, and flavorings. Cocoa powder. Vinegar, ketchup, yellow mustard, smooth sauces (such as hollandaise, cheese sauce, or white sauce), and soy sauce. Sweets and Desserts Custard, smooth pudding. Flavored gelatin. Tapioca, junket. Plain ice cream, sherbet, fruit ices. Frozen ice pops, frozen fudge pops, pudding pops, and other frozen bars with cream. Syrups, including chocolate syrup. Sugar, honey, jelly.  Fats and Oils Margarine, butter, cream, sour cream, and oils. Other Broth and cream soups. Strained, broth-based soups. The items listed above may not be a complete list of recommended foods or beverages.  WHAT FOODS CAN I NOT EAT? Grains All breads. Grains are not allowed unless they are pureed into soup. Vegetables Vegetables are not allowed unless they are juiced, or cooked and pureed into soup. Fruits Fruits are  not allowed unless they are juiced. Meats and Other Protein Sources Any meat or fish. Cooked or raw eggs. Nut butters.  Dairy Cheese.  Condiments Stone ground mustards. Fats and Oils Fats that are coarse or chunky. Sweets and Desserts Ice cream or other frozen desserts that have any solids in them or on top, such as nuts, chocolate chips, and pieces of cookies. Cakes. Cookies. Candy. Others Soups with chunks or pieces in them.  How and Where to Give Subcutaneous Enoxaparin Injections Enoxaparin is an injectable medicine. It is used to help prevent blood clots from developing in your veins. Health care providers often use anticoagulants like enoxaparin to prevent clots following surgery. Enoxaparin is also used in  combination with other medicines to treat blood clots and heart attacks. If blood clots are left untreated, they can be life threatening.  Enoxaparin comes in single-use syringes. You inject enoxaparin through a syringe into your belly (abdomen). You should change the injection site each time you give yourself a shot. Continue the enoxaparin injections as directed by your health care provider. Your health care provider will use blood clotting test results to decide when you can safely stop using enoxaparin injections. If your health care provider prescribes any additional medicines, use the medicines exactly as directed. HOW DO I INJECT ENOXAPARIN?  6. Wash your hands with soap and water. 7. Clean the selected injection site as directed by your health care provider. 8. Remove the needle cap by pulling it straight off the syringe. 9. Hold the syringe like a pencil using your writing hand. 10. Use your other hand to pinch and hold an inch of the cleansed skin. 11. Insert the entire needle straight down into the fold of skin. 12. Push the plunger with your thumb until the syringe is empty. 13. Pull the needle straight out of your skin. 14. Enoxaparin injection prefilled syringes and graduated prefilled syringes are available with a system that shields the needle after injection. After you have completed your injection and removed the needle from your skin, firmly push down on the plunger. The protective sleeve will automatically cover the needle and you will hear a click. The click means the needle is safely covered. 15. Place the syringe in the nearest needle box, also called a sharps container. If you do not have a sharps container, you can use a hard-sided plastic container with a secure lid, such as an empty laundry detergent bottle. WHAT ELSE DO I NEED TO KNOW?  Do not use enoxaparin if:  You have allergies to heparin or pork products.  You have been diagnosed with a condition called  thrombocytopenia.  Do not use the syringe or needle more than one time.  Use medicines only as directed by your health care provider.  Changes in medicines, supplements, diet, and illness can affect your anticoagulation therapy. Be sure to inform your health care provider of any of these changes.  It is important that you tell all of your health care providers and your dentist that you are taking an anticoagulant, especially if you are injured or plan to have any type of procedure.  While on anticoagulants, you will need to have blood tests done routinely as directed by your health care provider.  While using this medicine, avoid physical activities or sports that could result in a fall or cause injury.  Follow up with your laboratory test and health care provider appointments as directed. It is very important to keep your appointments. Not keeping appointments could  result in a chronic or permanent injury, pain, or disability.  Before giving your medicine, you should make sure the injection is a clear and colorless or pale yellow solution. If your medicine becomes discolored or if there are particles in the syringe, do not use it and notify your health care provider.  Keep your medicine safely stored at room temperature. SEEK MEDICAL CARE IF:  You develop any rashes on your skin.  You have large areas of bruising on your skin.  You have any worsening of the condition for which you take Enoxaparin.  You develop a fever. SEEK IMMEDIATE MEDICAL CARE IF:  You develop bleeding problems such as:  Bleeding from the gums or nose that does not stop quickly.  Vomiting blood or coughing up blood.  Blood in your urine.  Blood in your stool, or stool that has a dark, tarry, or coffee grounds appearance.  A cut that does not stop bleeding within 10 minutes. These symptoms may represent a serious problem that is an emergency. Do not wait to see if the symptoms will go away. Get medical  help right away. Call your local emergency services (911 in the [place] Do not drive yourself to the hospital.    This information is not intended to replace advice given to you by your health care provider. Make sure you discuss any questions you have with your health care provider.   Document Released: 08/04/2004 Document Revised: 10/24/2014 Document Reviewed: 03/20/2014 Elsevier Interactive Patient Education [employer].

## 2016-02-09 LAB — CA 125: Cancer Antigen (CA) 125: 656.2 U/mL — ABNORMAL HIGH (ref 0.0–38.1)

## 2016-02-10 ENCOUNTER — Other Ambulatory Visit: Payer: Self-pay

## 2016-02-12 ENCOUNTER — Telehealth: Payer: Self-pay | Admitting: Gynecologic Oncology

## 2016-02-12 ENCOUNTER — Other Ambulatory Visit: Payer: Self-pay | Admitting: Radiology

## 2016-02-12 NOTE — Telephone Encounter (Signed)
Returned call to patient.  Pt asking about what clothes to wear after surgery.  Wanting to add her son on her HIPPA release: Son is Jessica Johnston, Jessica Johnston. And his cell is [phone].  All questions answered.  Advised to call for any needs or concerns.

## 2016-02-12 NOTE — Telephone Encounter (Signed)
Attempted to return call to patient.  Left message asking her to please call the office. 

## 2016-02-15 ENCOUNTER — Ambulatory Visit (HOSPITAL_COMMUNITY)
Admission: RE | Admit: 2016-02-15 | Discharge: 2016-02-15 | Disposition: A | Discharge status: Home / Self Care | Payer: Medicare Other | Source: Ambulatory Visit | Attending: Gynecologic Oncology | Admitting: Gynecologic Oncology

## 2016-02-15 ENCOUNTER — Encounter (HOSPITAL_COMMUNITY): Payer: Self-pay

## 2016-02-15 ENCOUNTER — Ambulatory Visit: Payer: Medicare Other | Admitting: Gynecologic Oncology

## 2016-02-15 ENCOUNTER — Encounter

## 2016-02-15 DIAGNOSIS — Z85820 Personal history of malignant melanoma of skin: Secondary | ICD-10-CM

## 2016-02-15 DIAGNOSIS — L02211 Cutaneous abscess of abdominal wall: Secondary | ICD-10-CM | POA: Diagnosis not present

## 2016-02-15 DIAGNOSIS — N281 Cyst of kidney, acquired: Secondary | ICD-10-CM

## 2016-02-15 DIAGNOSIS — E7800 Pure hypercholesterolemia, unspecified: Secondary | ICD-10-CM | POA: Insufficient documentation

## 2016-02-15 DIAGNOSIS — K668 Other specified disorders of peritoneum: Secondary | ICD-10-CM | POA: Insufficient documentation

## 2016-02-15 DIAGNOSIS — Z01812 Encounter for preprocedural laboratory examination: Secondary | ICD-10-CM

## 2016-02-15 DIAGNOSIS — Z7982 Long term (current) use of aspirin: Secondary | ICD-10-CM

## 2016-02-15 DIAGNOSIS — D490 Neoplasm of unspecified behavior of digestive system: Secondary | ICD-10-CM | POA: Diagnosis not present

## 2016-02-15 DIAGNOSIS — C482 Malignant neoplasm of peritoneum, unspecified: Secondary | ICD-10-CM

## 2016-02-15 LAB — CBC WITH DIFFERENTIAL/PLATELET
BASOS ABS: 0 10*3/uL (ref 0.0–0.1)
BASOS PCT: 0 %
Eosinophils Absolute: 0.1 10*3/uL (ref 0.0–0.7)
Eosinophils Relative: 2 %
HEMATOCRIT: 39 % (ref 36.0–46.0)
Hemoglobin: 12.8 g/dL (ref 12.0–15.0)
LYMPHS PCT: 25 %
Lymphs Abs: 1.1 10*3/uL (ref 0.7–4.0)
MCH: 29.4 pg (ref 26.0–34.0)
MCHC: 32.8 g/dL (ref 30.0–36.0)
MCV: 89.4 fL (ref 78.0–100.0)
Monocytes Absolute: 0.4 10*3/uL (ref 0.1–1.0)
Monocytes Relative: 10 %
NEUTROS ABS: 2.7 10*3/uL (ref 1.7–7.7)
Neutrophils Relative %: 63 %
Platelets: 254 10*3/uL (ref 150–400)
RBC: 4.36 MIL/uL (ref 3.87–5.11)
RDW: 13.2 % (ref 11.5–15.5)
WBC: 4.3 10*3/uL (ref 4.0–10.5)

## 2016-02-15 LAB — PROTIME-INR
INR: 1.03 (ref 0.00–1.49)
Prothrombin Time: 13.7 seconds (ref 11.6–15.2)

## 2016-02-15 MED ORDER — VENLAFAXINE SR 37.5 MG 24 HR CAP
37.5 mg | ORAL_CAPSULE | Freq: Every day | ORAL | 2 refills | Status: DC
Start: 2016-02-15 — End: 2016-07-24

## 2016-02-15 MED ORDER — MIDAZOLAM HCL 2 MG/2ML IJ SOLN
INTRAMUSCULAR | Status: AC
  Filled 2016-02-15: qty 4

## 2016-02-15 MED ORDER — FENTANYL CITRATE (PF) 100 MCG/2ML IJ SOLN
INTRAMUSCULAR | Status: AC | PRN
  Administered 2016-02-15: 50 ug via INTRAVENOUS

## 2016-02-15 MED ORDER — HYDROCODONE-ACETAMINOPHEN 5-325 MG PO TABS
1.0000 | ORAL_TABLET | ORAL | Status: DC | PRN
  Filled 2016-02-15: qty 2

## 2016-02-15 MED ORDER — SODIUM CHLORIDE 0.9 % IV SOLN
INTRAVENOUS | Status: DC
  Administered 2016-02-15: 10:00:00 via INTRAVENOUS

## 2016-02-15 MED ORDER — MIDAZOLAM HCL 2 MG/2ML IJ SOLN
INTRAMUSCULAR | Status: AC | PRN
  Administered 2016-02-15: 1 mg via INTRAVENOUS

## 2016-02-15 MED ORDER — FENTANYL CITRATE (PF) 100 MCG/2ML IJ SOLN
INTRAMUSCULAR | Status: AC
  Filled 2016-02-15: qty 2

## 2016-02-15 NOTE — Sedation Documentation (Signed)
Patient denies pain and is resting comfortably.  

## 2016-02-15 NOTE — Discharge Instructions (Signed)
Needle Biopsy, Care After °These instructions give you information about caring for yourself after your procedure. Your doctor may also give you more specific instructions. Call your doctor if you have any problems or questions after your procedure. °HOME CARE °· Rest as told by your doctor. °· Take medicines only as told by your doctor. °· There are many different ways to close and cover the biopsy site, including stitches (sutures), skin glue, and adhesive strips. Follow instructions from your doctor about: °¨ How to take care of your biopsy site. °¨ When and how you should change your bandage (dressing). °¨ When you should remove your dressing. °¨ Removing whatever was used to close your biopsy site. °· Check your biopsy site every day for signs of infection. Watch for: °¨ Redness, swelling, or pain. °¨ Fluid, blood, or pus. °GET HELP IF: °· You have a fever. °· You have redness, swelling, or pain at the biopsy site, and it lasts longer than a few days. °· You have fluid, blood, or pus coming from the biopsy site. °· You feel sick to your stomach (nauseous). °· You throw up (vomit). °GET HELP RIGHT AWAY IF: °· You are short of breath. °· You have trouble breathing. °· Your chest hurts. °· You feel dizzy or you pass out (faint). °· You have bleeding that does not stop with pressure or a bandage. °· You cough up blood. °· Your belly (abdomen) hurts. °  °This information is not intended to replace advice given to you by your health care provider. Make sure you discuss any questions you have with your health care provider. °  °Document Released: 09/15/2008 Document Revised: 02/17/2015 Document Reviewed: 09/29/2014 °Elsevier Interactive Patient Education ©2016 [employer] °Moderate Conscious Sedation, Adult, Care After °Refer to this sheet in the next few weeks. These instructions provide you with information on caring for yourself after your procedure. Your health care provider may also give you more specific  instructions. Your treatment has been planned according to current medical practices, but problems sometimes occur. Call your health care provider if you have any problems or questions after your procedure. °WHAT TO EXPECT AFTER THE PROCEDURE  °After your procedure: °· You may feel sleepy, clumsy, and have poor balance for several hours. °· Vomiting may occur if you eat too soon after the procedure. °HOME CARE INSTRUCTIONS °· Do not participate in any activities where you could become injured for at least 24 hours. Do not: °· Drive. °· Swim. °· Ride a bicycle. °· Operate heavy machinery. °· Cook. °· Use power tools. °· Climb ladders. °· Work from a high place. °· Do not make important decisions or sign legal documents until you are improved. °· If you vomit, drink water, juice, or soup when you can drink without vomiting. Make sure you have little or no nausea before eating solid foods. °· Only take over-the-counter or prescription medicines for pain, discomfort, or fever as directed by your health care provider. °· Make sure you and your family fully understand everything about the medicines given to you, including what side effects may occur. °· You should not drink alcohol, take sleeping pills, or take medicines that cause drowsiness for at least 24 hours. °· If you smoke, do not smoke without supervision. °· If you are feeling better, you may resume normal activities 24 hours after you were sedated. °· Keep all appointments with your health care provider. °SEEK MEDICAL CARE IF: °· Your skin is pale or bluish in color. °· You   continue to feel nauseous or vomit. °· Your pain is getting worse and is not helped by medicine. °· You have bleeding or swelling. °· You are still sleepy or feeling clumsy after 24 hours. °SEEK IMMEDIATE MEDICAL CARE IF: °· You develop a rash. °· You have difficulty breathing. °· You develop any type of allergic problem. °· You have a fever. °MAKE SURE YOU: °· Understand these  instructions. °· Will watch your condition. °· Will get help right away if you are not doing well or get worse. °  °This information is not intended to replace advice given to you by your health care provider. Make sure you discuss any questions you have with your health care provider. °  °Document Released: 07/24/2013 Document Revised: 10/24/2014 Document Reviewed: 07/24/2013 °Elsevier Interactive Patient Education ©2016 Elsevier Inc. ° °

## 2016-02-15 NOTE — Consult Note (Signed)
Chief Complaint: Patient was seen in consultation today for CT guided omental nodularity biopsy  Referring Physician(s): Cross,Melissa D/Rossi,Emma Supervising Physician: Aletta Edouard  Patient Status:  Out-pt  History of Present Illness: Jessica Johnston is a 75 y.o. female with remote history of melanoma and now with approximately 4 month history of mid to lower abdominal discomfort. Recent CT of the abdomen and pelvis on 01/27/16  revealed extensive omental nodularity as well as a low-density right adnexal lesion measuring up to 3.5 cm and small indeterminate low-density hepatic lesions. She presents today for CT guided omental nodularity biopsy for further evaluation.  Past Medical History  Diagnosis Date  . High cholesterol   . Cancer [facility])     MELANOMA    Past Surgical History  Procedure Laterality Date  . Tonsillectomy and adenoidectomy  1948  . Hysteroscopy      Allergies: Review of patient's allergies indicates no known allergies.  Medications: Prior to Admission medications   Medication Sig Start Date End Date Taking? Authorizing Provider  acetaminophen (TYLENOL) 500 MG tablet Take 1,000 mg by mouth every 6 (six) hours as needed (For pain.).   Yes Historical Provider, MD  aspirin 81 MG tablet Take 81 mg by mouth daily.    Historical Provider, MD  aspirin EC 81 MG tablet Take 81 mg by mouth daily.    Historical Provider, MD  Cholecalciferol (VITAMIN D3) 2000 units TABS Take 2,000 Units by mouth daily.    Historical Provider, MD  ezetimibe (ZETIA) 10 MG tablet Take 10 mg by mouth daily.     Historical Provider, MD  Pitavastatin Calcium (LIVALO) 2 MG TABS Take 2 mg by mouth daily.     Historical Provider, MD     History reviewed. No pertinent family history.  Social History   Social History  . Marital Status: Married    Spouse Name: N/A  . Number of Children: N/A  . Years of Education: N/A   Social History Main Topics  . Smoking status: Never Smoker   .  Smokeless tobacco: Never Used  . Alcohol Use: 0.0 oz/week    0 Standard drinks or equivalent per week  . Drug Use: No  . Sexual Activity: No   Other Topics Concern  . None   Social History Narrative     Review of Systems  Constitutional: Negative for fever and chills.  Respiratory: Negative for cough and shortness of breath.   Cardiovascular: Negative for chest pain.  Gastrointestinal: Positive for abdominal pain. Negative for nausea, vomiting and blood in stool.  Genitourinary: Negative for dysuria and hematuria.  Musculoskeletal: Negative for back pain.  Neurological: Negative for headaches.    Vital Signs: BP 110/60 mmHg  Pulse 72  Temp(Src) 98 F (36.7 C) (Oral)  Resp 18  SpO2 99%  Physical Exam  Constitutional: She is oriented to person, place, and time. She appears well-developed and well-nourished.  Cardiovascular: Normal rate and regular rhythm.   Pulmonary/Chest: Effort normal.  Few left basilar crackles, right clear.  Abdominal: Soft. Bowel sounds are normal. There is tenderness.  Musculoskeletal: Normal range of motion. She exhibits no edema.  Neurological: She is alert and oriented to person, place, and time.    Mallampati Score:     Imaging: Ct Abdomen Pelvis W Contrast  01/27/2016  CLINICAL DATA:  Intermittent mid abdominal pain for 4 months. History of ovarian cyst. Remote history of melanoma. EXAM: CT ABDOMEN AND PELVIS WITH CONTRAST TECHNIQUE: Multidetector CT imaging of the  abdomen and pelvis was performed using the standard protocol following bolus administration of intravenous contrast. CONTRAST:  140mL ISOVUE-300 IOPAMIDOL (ISOVUE-300) INJECTION 61% COMPARISON:  None. FINDINGS: Lower chest: Mild linear scarring or atelectasis at both lung bases. No suspicious nodularity, pleural or pericardial effusion. Hepatobiliary: The liver demonstrates a few small low-density lesions, most notably on images 22, 30 and 36. No highly worrisome hepatic findings  are seen. No evidence of gallstones, gallbladder wall thickening or biliary dilatation. Pancreas: Mildly atrophied without focal abnormality. No surrounding inflammation or ductal dilatation. Spleen: Normal in size without focal abnormality. Adrenals/Urinary Tract: The is a punctate calcification and within the left adrenal gland. The right adrenal gland appears normal. No evidence of adrenal mass. There are no suspicious renal findings. There is no urinary tract calculus or hydronephrosis. There is an extrarenal pelvis on the right. There are left renal parapelvic cysts. The bladder appears unremarkable. Stomach/Bowel: No evidence of bowel wall thickening, distention or surrounding inflammatory change. There are mild diverticular changes throughout the colon. The appendix appears normal. Vascular/Lymphatic: There are no enlarged abdominal or pelvic lymph nodes. Mild aortic and branch vessel atherosclerosis. Reproductive: Low-density right adnexal lesion measures 2.8 x 2.7 x 3.5 cm. This demonstrates no solid components. The left ovary appears normal. There are linear, somewhat nodular calcifications along the anterior margin of the uterus, likely related to the peritoneal disease described below. No focal uterine mass or solid adnexal mass identified. Other: There is extensive nodularity throughout the omentum, highly worrisome for peritoneal carcinomatosis. There is a small amount of ascites, predominantly perihepatic. No dominant peritoneal mass or suspicious subcutaneous nodularity identified. Musculoskeletal: No acute or significant osseous findings. IMPRESSION: 1. Extensive omental nodularity highly worrisome for peritoneal carcinomatosis. Late recurrence of melanoma can present as peritoneal carcinomatosis. Ovarian cancer more commonly presents in this manner. Patient does have a predominately low density right adnexal lesion measuring up to 3.5 cm, although this lesion is not typical for ovarian cancer. 2.  No evidence of bowel or ureteral obstruction. 3. No other definite signs of metastatic disease. There are small indeterminate low-density hepatic lesions. 4. These results will be called to the ordering clinician or representative by the Radiologist Assistant, and communication documented in the PACS or zVision Dashboard. Electronically Signed   By: Richardean Sale M.D.   On: 01/27/2016 15:34    Labs:  CBC:  Recent Labs  02/15/16 0955  WBC 4.3  HGB 12.8  HCT 39.0  PLT 254    COAGS:  Recent Labs  02/15/16 0955  INR 1.03    BMP:  Recent Labs  02/08/16 1159  NA 141  K 4.5  CO2 26  GLUCOSE 102  BUN 10.9  CALCIUM 9.5  CREATININE 0.7    LIVER FUNCTION TESTS:  Recent Labs  02/08/16 1159  BILITOT <0.30  AST 33  ALT 55  ALKPHOS 97  PROT 7.0  ALBUMIN 3.6    TUMOR MARKERS: No results for input(s): AFPTM, CEA, CA199, CHROMGRNA in the last 8760 hours.  Assessment and Plan: 75 y.o. female with remote history of melanoma and now with approximately 4 month history of mid to lower abdominal discomfort. Recent CT of the abdomen and pelvis on 01/27/16  revealed extensive omental nodularity as well as a low-density right adnexal lesion measuring up to 3.5 cm and small indeterminate low-density hepatic lesions. She presents today for CT guided omental nodularity biopsy for further evaluation.Risks and benefits discussed with the patient/family including, but not limited to bleeding, infection, damage to  adjacent structures or low yield requiring additional tests.All of the patient's questions were answered, patient is agreeable to proceed.Consent signed and in chart.     Thank you for this interesting consult.  I greatly enjoyed meeting ZORA KINGRY and look forward to participating in their care.  A copy of this report was sent to the requesting provider on this date.  Electronically Signed: D. Rowe Robert 02/15/2016, 11:32 AM   I spent a total of 20 minutes in face to  face in clinical consultation, greater than 50% of which was counseling/coordinating care for US guided omental nodularity biopsy

## 2016-02-15 NOTE — Procedures (Signed)
Interventional Radiology Procedure Note  Procedure:  CT guided core biopsy of omental mass  Complications:  None  Estimated Blood Loss: < 10 mL  18 G core biopsy x 4 via 17 G needle of anterior omental mass(es) just deep to left abdominal wall.  Jessica Johnston. Kathlene Cote, M.D Pager:  [phone]

## 2016-02-16 ENCOUNTER — Telehealth: Payer: Self-pay | Admitting: Gynecologic Oncology

## 2016-02-16 ENCOUNTER — Encounter (HOSPITAL_COMMUNITY): Payer: Self-pay

## 2016-02-16 ENCOUNTER — Encounter (HOSPITAL_COMMUNITY)
Admission: RE | Admit: 2016-02-16 | Discharge: 2016-02-16 | Disposition: A | Discharge status: Home / Self Care | Payer: Medicare Other | Source: Ambulatory Visit | Attending: Gynecologic Oncology | Admitting: Gynecologic Oncology

## 2016-02-16 ENCOUNTER — Ambulatory Visit (HOSPITAL_COMMUNITY)
Admission: RE | Admit: 2016-02-16 | Discharge: 2016-02-16 | Disposition: A | Discharge status: Home / Self Care | Payer: Medicare Other | Source: Ambulatory Visit | Attending: Gynecologic Oncology | Admitting: Gynecologic Oncology

## 2016-02-16 DIAGNOSIS — K668 Other specified disorders of peritoneum: Secondary | ICD-10-CM | POA: Diagnosis not present

## 2016-02-16 HISTORY — DX: Unspecified osteoarthritis, unspecified site: M19.90

## 2016-02-16 HISTORY — DX: Headache: R51

## 2016-02-16 HISTORY — DX: Headache, unspecified: R51.9

## 2016-02-16 LAB — COMPREHENSIVE METABOLIC PANEL
ALK PHOS: 79 U/L (ref 38–126)
ALT: 29 U/L (ref 14–54)
AST: 19 U/L (ref 15–41)
Albumin: 3.5 g/dL (ref 3.5–5.0)
Anion gap: 7 (ref 5–15)
BILIRUBIN TOTAL: 0.2 mg/dL — AB (ref 0.3–1.2)
BUN: 12 mg/dL (ref 6–20)
CALCIUM: 8.9 mg/dL (ref 8.9–10.3)
CHLORIDE: 107 mmol/L (ref 101–111)
CO2: 26 mmol/L (ref 22–32)
CREATININE: 0.59 mg/dL (ref 0.44–1.00)
GFR calc Af Amer: >60 mL/min (ref >60)
Glucose, Bld: 136 mg/dL — ABNORMAL HIGH (ref 65–99)
Potassium: 4.6 mmol/L (ref 3.5–5.1)
Sodium: 140 mmol/L (ref 135–145)
Total Protein: 6.6 g/dL (ref 6.5–8.1)

## 2016-02-16 LAB — URINE MICROSCOPIC-ADD ON

## 2016-02-16 LAB — URINALYSIS, ROUTINE W REFLEX MICROSCOPIC
Bilirubin Urine: NEGATIVE
Glucose, UA: NEGATIVE mg/dL
HGB URINE DIPSTICK: NEGATIVE
Ketones, ur: NEGATIVE mg/dL
Nitrite: NEGATIVE
PH: 5.5 (ref 5.0–8.0)
Protein, ur: NEGATIVE mg/dL
Specific Gravity, Urine: 1.023 (ref 1.005–1.030)

## 2016-02-16 LAB — CBC WITH DIFFERENTIAL/PLATELET
BASOS ABS: 0 10*3/uL (ref 0.0–0.1)
Basophils Relative: 0 %
Eosinophils Absolute: 0.1 10*3/uL (ref 0.0–0.7)
Eosinophils Relative: 2 %
HEMATOCRIT: 37.7 % (ref 36.0–46.0)
HEMOGLOBIN: 12.3 g/dL (ref 12.0–15.0)
LYMPHS ABS: 1.3 10*3/uL (ref 0.7–4.0)
LYMPHS PCT: 26 %
MCH: 29.4 pg (ref 26.0–34.0)
MCHC: 32.6 g/dL (ref 30.0–36.0)
MCV: 90 fL (ref 78.0–100.0)
Monocytes Absolute: 0.4 10*3/uL (ref 0.1–1.0)
Monocytes Relative: 8 %
NEUTROS ABS: 3.1 10*3/uL (ref 1.7–7.7)
Neutrophils Relative %: 64 %
PLATELETS: 260 10*3/uL (ref 150–400)
RBC: 4.19 MIL/uL (ref 3.87–5.11)
RDW: 13.1 % (ref 11.5–15.5)
WBC: 4.9 10*3/uL (ref 4.0–10.5)

## 2016-02-16 LAB — ABO/RH: ABO/RH(D): O POS

## 2016-02-16 NOTE — Pre-Procedure Instructions (Signed)
CXR done today per MD order preop.

## 2016-02-16 NOTE — Progress Notes (Signed)
02-16-16 1530 Note CXR report viewable in Epic.

## 2016-02-16 NOTE — Patient Instructions (Addendum)
Jessica Johnston  02/16/2016   Your procedure is scheduled on: 02-18-16  Report to [facility] Main  Entrance take [facility]  elevators to 3rd floor to  [place] at [place] AM.  Call this number if you have problems the morning of surgery [phone]   Remember: ONLY 1 PERSON MAY GO WITH YOU TO SHORT STAY TO GET  READY MORNING OF YOUR SURGERY.  Bowel Prep per MD instructions: Drink 2 bottle of Magnesium Citrate the day before surgery. Full Liquid diet the day before surgery. Examples including creamy soups, broths, yogurt. Things to avoid include carbonated beverages(fizzy beverages). If your bowels are filled with gas, your surgeon will have difficulty visualizing your pelvic organs which increases your surgical risks.  Do not eat food or drink liquids :After Midnight.     Take these medicines the morning of surgery with A SIP OF WATER: Tylenol-if need. DO NOT TAKE ANY DIABETIC MEDICATIONS DAY OF YOUR SURGERY                               You may not have any metal on your body including hair pins and              piercings  Do not wear jewelry, make-up, lotions, powders or perfumes, deodorant             Do not wear nail polish.  Do not shave  48 hours prior to surgery.              Men may shave face and neck.   Do not bring valuables to the hospital. [place].  Contacts, dentures or bridgework may not be worn into surgery.  Leave suitcase in the car. After surgery it may be brought to your room.     Patients discharged the day of surgery will not be allowed to drive home.  Name and phone number of your driver: leonard, president [phone] cell.  Special Instructions: N/A              Please read over the following fact sheets you were given: _____________________________________________________________________             [facility] - Preparing for Surgery Before surgery, you can play an important role.   Because skin is not sterile, your skin needs to be as free of germs as possible.  You can reduce the number of germs on your skin by washing with CHG (chlorahexidine gluconate) soap before surgery.  CHG is an antiseptic cleaner which kills germs and bonds with the skin to continue killing germs even after washing. Please DO NOT use if you have an allergy to CHG or antibacterial soaps.  If your skin becomes reddened/irritated stop using the CHG and inform your nurse when you arrive at Short Stay. Do not shave (including legs and underarms) for at least 48 hours prior to the first CHG shower.  You may shave your face/neck. Please follow these instructions carefully:  1.  Shower with CHG Soap the night before surgery and the  morning of Surgery.  2.  If you choose to wash your hair, wash your hair first as usual with your  normal  shampoo.  3.  After you shampoo,  rinse your hair and body thoroughly to remove the  shampoo.                           4.  Use CHG as you would any other liquid soap.  You can apply chg directly  to the skin and wash                       Gently with a scrungie or clean washcloth.  5.  Apply the CHG Soap to your body ONLY FROM THE NECK DOWN.   Do not use on face/ open                           Wound or open sores. Avoid contact with eyes, ears mouth and genitals (private parts).                       Wash face,  Genitals (private parts) with your normal soap.             6.  Wash thoroughly, paying special attention to the area where your surgery  will be performed.  7.  Thoroughly rinse your body with warm water from the neck down.  8.  DO NOT shower/wash with your normal soap after using and rinsing off  the CHG Soap.                9.  Pat yourself dry with a clean towel.            10.  Wear clean pajamas.            11.  Place clean sheets on your bed the night of your first shower and do not  sleep with pets. Day of Surgery : Do not apply any lotions/deodorants the  morning of surgery.  Please wear clean clothes to the hospital/surgery center.  FAILURE TO FOLLOW THESE INSTRUCTIONS MAY RESULT IN THE CANCELLATION OF YOUR SURGERY PATIENT SIGNATURE_________________________________  NURSE SIGNATURE__________________________________  ________________________________________________________________________   Adam Phenix  An incentive spirometer is a tool that can help keep your lungs clear and active. This tool measures how well you are filling your lungs with each breath. Taking long deep breaths may help reverse or decrease the chance of developing breathing (pulmonary) problems (especially infection) following:  A long period of time when you are unable to move or be active. BEFORE THE PROCEDURE   If the spirometer includes an indicator to show your best effort, your nurse or respiratory therapist will set it to a desired goal.  If possible, sit up straight or lean slightly forward. Try not to slouch.  Hold the incentive spirometer in an upright position. INSTRUCTIONS FOR USE  1. Sit on the edge of your bed if possible, or sit up as far as you can in bed or on a chair. 2. Hold the incentive spirometer in an upright position. 3. Breathe out normally. 4. Place the mouthpiece in your mouth and seal your lips tightly around it. 5. Breathe in slowly and as deeply as possible, raising the piston or the ball toward the top of the column. 6. Hold your breath for 3-5 seconds or for as long as possible. Allow the piston or ball to fall to the bottom of the column. 7. Remove the mouthpiece from your mouth and breathe out normally. 8. Rest for a few seconds and  repeat Steps 1 through 7 at least 10 times every 1-2 hours when you are awake. Take your time and take a few normal breaths between deep breaths. 9. The spirometer may include an indicator to show your best effort. Use the indicator as a goal to work toward during each repetition. 10. After each  set of 10 deep breaths, practice coughing to be sure your lungs are clear. If you have an incision (the cut made at the time of surgery), support your incision when coughing by placing a pillow or rolled up towels firmly against it. Once you are able to get out of bed, walk around indoors and cough well. You may stop using the incentive spirometer when instructed by your caregiver.  RISKS AND COMPLICATIONS  Take your time so you do not get dizzy or light-headed.  If you are in pain, you may need to take or ask for pain medication before doing incentive spirometry. It is harder to take a deep breath if you are having pain. AFTER USE  Rest and breathe slowly and easily.  It can be helpful to keep track of a log of your progress. Your caregiver can provide you with a simple table to help with this. If you are using the spirometer at home, follow these instructions: [place] IF:   You are having difficultly using the spirometer.  You have trouble using the spirometer as often as instructed.  Your pain medication is not giving enough relief while using the spirometer.  You develop fever of 100.5 F (38.1 C) or higher. SEEK IMMEDIATE MEDICAL CARE IF:   You cough up bloody sputum that had not been present before.  You develop fever of 102 F (38.9 C) or greater.  You develop worsening pain at or near the incision site. MAKE SURE YOU:   Understand these instructions.  Will watch your condition.  Will get help right away if you are not doing well or get worse. Document Released: 02/13/2007 Document Revised: 12/26/2011 Document Reviewed: 04/16/2007 [facility] Patient Information 2014 [place], [place].   ________________________________________________________________________

## 2016-02-16 NOTE — Telephone Encounter (Signed)
Informed the son, per pt request, of the biopsy results.  No concerns voiced.  Plans for surgery on Thursday.  Advised to call for any concerns.

## 2016-02-17 NOTE — Anesthesia Preprocedure Evaluation (Addendum)
Anesthesia Evaluation  Patient identified by MRN, date of birth, ID band Patient awake    Reviewed: Allergy & Precautions, NPO status , Patient's Chart, lab work & pertinent test results  Airway Mallampati: II  TM Distance: >3 FB Neck ROM: Full    Dental no notable dental hx.    Pulmonary neg pulmonary ROS,    Pulmonary exam normal breath sounds clear to auscultation       Cardiovascular negative cardio ROS Normal cardiovascular exam Rhythm:Regular Rate:Normal     Neuro/Psych  Headaches, negative psych ROS   GI/Hepatic negative GI ROS, Neg liver ROS,   Endo/Other  negative endocrine ROS  Renal/GU negative Renal ROS     Musculoskeletal  (+) Arthritis ,   Abdominal   Peds  Hematology negative hematology ROS (+)   Anesthesia Other Findings   Reproductive/Obstetrics negative OB ROS                             Anesthesia Physical Anesthesia Plan  ASA: II  Anesthesia Plan: General   Post-op Pain Management:    Induction: Intravenous  Airway Management Planned: Oral ETT  Additional Equipment:   Intra-op Plan:   Post-operative Plan: Extubation in OR  Informed Consent: I have reviewed the patients History and Physical, chart, labs and discussed the procedure including the risks, benefits and alternatives for the proposed anesthesia with the patient or authorized representative who has indicated his/her understanding and acceptance.   Dental advisory given  Plan Discussed with: CRNA  Anesthesia Plan Comments:        Anesthesia Quick Evaluation

## 2016-02-18 ENCOUNTER — Encounter (HOSPITAL_COMMUNITY): Payer: Self-pay

## 2016-02-18 ENCOUNTER — Inpatient Hospital Stay (HOSPITAL_COMMUNITY)
Admission: RE | Admit: 2016-02-18 | Discharge: 2016-02-22 | DRG: 737 | Disposition: A | Discharge status: Home / Self Care | Payer: Medicare Other | Source: Ambulatory Visit | Attending: Obstetrics & Gynecology | Admitting: Obstetrics & Gynecology

## 2016-02-18 ENCOUNTER — Inpatient Hospital Stay (HOSPITAL_COMMUNITY): Payer: Medicare Other | Admitting: Anesthesiology

## 2016-02-18 ENCOUNTER — Encounter (HOSPITAL_COMMUNITY)
Admission: RE | Disposition: A | Discharge status: Home / Self Care | Payer: Self-pay | Source: Ambulatory Visit | Attending: Obstetrics & Gynecology

## 2016-02-18 DIAGNOSIS — K660 Peritoneal adhesions (postprocedural) (postinfection): Secondary | ICD-10-CM | POA: Diagnosis present

## 2016-02-18 DIAGNOSIS — C5702 Malignant neoplasm of left fallopian tube: Secondary | ICD-10-CM | POA: Diagnosis not present

## 2016-02-18 DIAGNOSIS — R1900 Intra-abdominal and pelvic swelling, mass and lump, unspecified site: Secondary | ICD-10-CM | POA: Diagnosis present

## 2016-02-18 DIAGNOSIS — C481 Malignant neoplasm of specified parts of peritoneum: Secondary | ICD-10-CM | POA: Diagnosis not present

## 2016-02-18 DIAGNOSIS — R112 Nausea with vomiting, unspecified: Secondary | ICD-10-CM | POA: Diagnosis not present

## 2016-02-18 DIAGNOSIS — E860 Dehydration: Secondary | ICD-10-CM | POA: Diagnosis present

## 2016-02-18 DIAGNOSIS — Z85820 Personal history of malignant melanoma of skin: Secondary | ICD-10-CM

## 2016-02-18 DIAGNOSIS — C569 Malignant neoplasm of unspecified ovary: Secondary | ICD-10-CM

## 2016-02-18 DIAGNOSIS — C562 Malignant neoplasm of left ovary: Principal | ICD-10-CM | POA: Diagnosis present

## 2016-02-18 DIAGNOSIS — E7800 Pure hypercholesterolemia, unspecified: Secondary | ICD-10-CM | POA: Diagnosis present

## 2016-02-18 DIAGNOSIS — K668 Other specified disorders of peritoneum: Secondary | ICD-10-CM

## 2016-02-18 DIAGNOSIS — C786 Secondary malignant neoplasm of retroperitoneum and peritoneum: Secondary | ICD-10-CM | POA: Diagnosis present

## 2016-02-18 DIAGNOSIS — C561 Malignant neoplasm of right ovary: Secondary | ICD-10-CM

## 2016-02-18 HISTORY — PX: SALPINGOOPHORECTOMY: SHX82

## 2016-02-18 HISTORY — PX: ABDOMINAL HYSTERECTOMY: SHX81

## 2016-02-18 HISTORY — PX: DEBULKING: SHX6277

## 2016-02-18 HISTORY — PX: OMENTECTOMY: SHX5985

## 2016-02-18 HISTORY — PX: LAPAROTOMY: SHX154

## 2016-02-18 LAB — TYPE AND SCREEN
ABO/RH(D): O POS
Antibody Screen: NEGATIVE

## 2016-02-18 SURGERY — LAPAROTOMY, EXPLORATORY
Anesthesia: General | Site: Abdomen

## 2016-02-18 MED ORDER — HYDROMORPHONE HCL 1 MG/ML IJ SOLN
0.2500 mg | INTRAMUSCULAR | Status: DC | PRN
  Administered 2016-02-18 (×2): 0.5 mg via INTRAVENOUS

## 2016-02-18 MED ORDER — ARTIFICIAL TEARS OP OINT
TOPICAL_OINTMENT | OPHTHALMIC | Status: AC
  Filled 2016-02-18: qty 3.5

## 2016-02-18 MED ORDER — OXYCODONE HCL 5 MG PO TABS
5.0000 mg | ORAL_TABLET | ORAL | Status: DC | PRN
  Administered 2016-02-18 – 2016-02-22 (×11): 5 mg via ORAL
  Filled 2016-02-18 (×12): qty 1

## 2016-02-18 MED ORDER — DEXTROSE 5 % IV SOLN
INTRAVENOUS | Status: AC
  Filled 2016-02-18: qty 2

## 2016-02-18 MED ORDER — BUPIVACAINE LIPOSOME 1.3 % IJ SUSP
20.0000 mL | Freq: Once | INTRAMUSCULAR | Status: DC
  Filled 2016-02-18: qty 20

## 2016-02-18 MED ORDER — LIP MEDEX EX OINT
TOPICAL_OINTMENT | CUTANEOUS | Status: AC
  Filled 2016-02-18: qty 7

## 2016-02-18 MED ORDER — TRAMADOL HCL 50 MG PO TABS
100.0000 mg | ORAL_TABLET | Freq: Two times a day (BID) | ORAL | Status: DC
  Administered 2016-02-18 – 2016-02-20 (×6): 100 mg via ORAL
  Filled 2016-02-18 (×6): qty 2

## 2016-02-18 MED ORDER — KCL IN DEXTROSE-NACL 20-5-0.45 MEQ/L-%-% IV SOLN
INTRAVENOUS | Status: DC
  Administered 2016-02-18: 17:00:00 via INTRAVENOUS
  Filled 2016-02-18 (×3): qty 1000

## 2016-02-18 MED ORDER — ROCURONIUM BROMIDE 100 MG/10ML IV SOLN
INTRAVENOUS | Status: AC
  Filled 2016-02-18: qty 1

## 2016-02-18 MED ORDER — DEXAMETHASONE SODIUM PHOSPHATE 10 MG/ML IJ SOLN
INTRAMUSCULAR | Status: AC
  Filled 2016-02-18: qty 1

## 2016-02-18 MED ORDER — HYDROMORPHONE HCL 1 MG/ML IJ SOLN
INTRAMUSCULAR | Status: AC
  Filled 2016-02-18: qty 1

## 2016-02-18 MED ORDER — LACTATED RINGERS IV SOLN: INTRAVENOUS | Status: DC

## 2016-02-18 MED ORDER — 0.9 % SODIUM CHLORIDE (POUR BTL) OPTIME
TOPICAL | Status: DC | PRN
  Administered 2016-02-18: 2000 mL

## 2016-02-18 MED ORDER — PROPOFOL 10 MG/ML IV BOLUS
INTRAVENOUS | Status: AC
  Filled 2016-02-18: qty 20

## 2016-02-18 MED ORDER — HYDROMORPHONE HCL 1 MG/ML IJ SOLN
INTRAMUSCULAR | Status: DC | PRN
  Administered 2016-02-18: .4 mg via INTRAVENOUS
  Administered 2016-02-18 (×3): .2 mg via INTRAVENOUS

## 2016-02-18 MED ORDER — ENOXAPARIN SODIUM 40 MG/0.4ML ~~LOC~~ SOLN
40.0000 mg | SUBCUTANEOUS | Status: DC
  Administered 2016-02-19 – 2016-02-22 (×4): 40 mg via SUBCUTANEOUS
  Filled 2016-02-18 (×5): qty 0.4

## 2016-02-18 MED ORDER — EZETIMIBE 10 MG PO TABS
10.0000 mg | ORAL_TABLET | Freq: Every day | ORAL | Status: DC
  Administered 2016-02-19 – 2016-02-22 (×4): 10 mg via ORAL
  Filled 2016-02-18 (×4): qty 1

## 2016-02-18 MED ORDER — LIDOCAINE HCL (CARDIAC) 20 MG/ML IV SOLN
INTRAVENOUS | Status: AC
  Filled 2016-02-18: qty 5

## 2016-02-18 MED ORDER — SODIUM CHLORIDE 0.9 % IJ SOLN
INTRAMUSCULAR | Status: AC
  Filled 2016-02-18: qty 20

## 2016-02-18 MED ORDER — PROPOFOL 10 MG/ML IV BOLUS
INTRAVENOUS | Status: DC | PRN
  Administered 2016-02-18: 120 mg via INTRAVENOUS

## 2016-02-18 MED ORDER — ONDANSETRON HCL 4 MG PO TABS
4.0000 mg | ORAL_TABLET | Freq: Four times a day (QID) | ORAL | Status: DC | PRN
Start: 2016-02-18 — End: 2016-02-22
  Administered 2016-02-21 – 2016-02-22 (×2): 4 mg via ORAL
  Filled 2016-02-18 (×2): qty 1

## 2016-02-18 MED ORDER — BUPIVACAINE HCL (PF) 0.25 % IJ SOLN
INTRAMUSCULAR | Status: DC | PRN
  Administered 2016-02-18: 20 mL

## 2016-02-18 MED ORDER — FENTANYL CITRATE (PF) 250 MCG/5ML IJ SOLN
INTRAMUSCULAR | Status: AC
  Filled 2016-02-18: qty 5

## 2016-02-18 MED ORDER — ONDANSETRON HCL 4 MG/2ML IJ SOLN
INTRAMUSCULAR | Status: AC
  Filled 2016-02-18: qty 2

## 2016-02-18 MED ORDER — GABAPENTIN 300 MG PO CAPS
300.0000 mg | ORAL_CAPSULE | Freq: Every day | ORAL | Status: AC
  Administered 2016-02-18: 300 mg via ORAL
  Filled 2016-02-18: qty 1

## 2016-02-18 MED ORDER — MAGNESIUM HYDROXIDE 400 MG/5ML PO SUSP
30.0000 mL | Freq: Three times a day (TID) | ORAL | Status: AC
  Administered 2016-02-18 – 2016-02-19 (×3): 30 mL via ORAL
  Filled 2016-02-18 (×2): qty 30

## 2016-02-18 MED ORDER — ACETAMINOPHEN 500 MG PO TABS
1000.0000 mg | ORAL_TABLET | Freq: Two times a day (BID) | ORAL | Status: DC
Start: 2016-02-18 — End: 2016-02-22
  Administered 2016-02-18 – 2016-02-22 (×7): 1000 mg via ORAL
  Filled 2016-02-18 (×12): qty 2

## 2016-02-18 MED ORDER — ENSURE ENLIVE PO LIQD
237.0000 mL | Freq: Two times a day (BID) | ORAL | Status: DC
  Administered 2016-02-18 – 2016-02-21 (×5): 237 mL via ORAL

## 2016-02-18 MED ORDER — ASPIRIN EC 81 MG PO TBEC
81.0000 mg | DELAYED_RELEASE_TABLET | Freq: Every day | ORAL | Status: DC
  Administered 2016-02-19 – 2016-02-22 (×4): 81 mg via ORAL
  Filled 2016-02-18 (×4): qty 1

## 2016-02-18 MED ORDER — ONDANSETRON HCL 4 MG/2ML IJ SOLN
INTRAMUSCULAR | Status: DC | PRN
  Administered 2016-02-18: 4 mg via INTRAVENOUS

## 2016-02-18 MED ORDER — FENTANYL CITRATE (PF) 250 MCG/5ML IJ SOLN
INTRAMUSCULAR | Status: DC | PRN
  Administered 2016-02-18: 50 ug via INTRAVENOUS
  Administered 2016-02-18: 100 ug via INTRAVENOUS
  Administered 2016-02-18 (×2): 50 ug via INTRAVENOUS

## 2016-02-18 MED ORDER — BUPIVACAINE HCL (PF) 0.25 % IJ SOLN
INTRAMUSCULAR | Status: AC
  Filled 2016-02-18: qty 30

## 2016-02-18 MED ORDER — SUGAMMADEX SODIUM 200 MG/2ML IV SOLN
INTRAVENOUS | Status: AC
  Filled 2016-02-18: qty 2

## 2016-02-18 MED ORDER — SENNOSIDES-DOCUSATE SODIUM 8.6-50 MG PO TABS
2.0000 | ORAL_TABLET | Freq: Every evening | ORAL | Status: DC | PRN
  Administered 2016-02-19: 2 via ORAL

## 2016-02-18 MED ORDER — DEXAMETHASONE SODIUM PHOSPHATE 10 MG/ML IJ SOLN
INTRAMUSCULAR | Status: DC | PRN
  Administered 2016-02-18: 10 mg via INTRAVENOUS

## 2016-02-18 MED ORDER — HYDROMORPHONE HCL 2 MG/ML IJ SOLN
INTRAMUSCULAR | Status: AC
  Filled 2016-02-18: qty 1

## 2016-02-18 MED ORDER — MEPERIDINE HCL 50 MG/ML IJ SOLN: 6.2500 mg | INTRAMUSCULAR | Status: DC | PRN

## 2016-02-18 MED ORDER — SODIUM CHLORIDE 0.9 % IV SOLN: 40.0000 mL | Freq: Once | INTRAVENOUS | Status: DC

## 2016-02-18 MED ORDER — ENOXAPARIN (LOVENOX) PATIENT EDUCATION KIT
PACK | Freq: Once | Status: AC
  Administered 2016-02-18: 17:00:00
  Filled 2016-02-18: qty 1

## 2016-02-18 MED ORDER — CEFOXITIN SODIUM 2 G IV SOLR
2.0000 g | INTRAVENOUS | Status: AC
  Administered 2016-02-18: 2 g via INTRAVENOUS

## 2016-02-18 MED ORDER — SODIUM CHLORIDE 0.9 % IJ SOLN
INTRAMUSCULAR | Status: DC | PRN
  Administered 2016-02-18: 40 mL

## 2016-02-18 MED ORDER — BUPIVACAINE LIPOSOME 1.3 % IJ SUSP
INTRAMUSCULAR | Status: DC | PRN
  Administered 2016-02-18: 20 mL

## 2016-02-18 MED ORDER — ENOXAPARIN SODIUM 40 MG/0.4ML ~~LOC~~ SOLN
40.0000 mg | SUBCUTANEOUS | Status: AC
  Administered 2016-02-18: 40 mg via SUBCUTANEOUS
  Filled 2016-02-18: qty 0.4

## 2016-02-18 MED ORDER — LIDOCAINE HCL (CARDIAC) 20 MG/ML IV SOLN
INTRAVENOUS | Status: DC | PRN
  Administered 2016-02-18: 60 mg via INTRATRACHEAL

## 2016-02-18 MED ORDER — IBUPROFEN 800 MG PO TABS
800.0000 mg | ORAL_TABLET | Freq: Four times a day (QID) | ORAL | Status: DC
  Administered 2016-02-19 – 2016-02-22 (×9): 800 mg via ORAL
  Filled 2016-02-18 (×18): qty 1

## 2016-02-18 MED ORDER — SUGAMMADEX SODIUM 200 MG/2ML IV SOLN
INTRAVENOUS | Status: DC | PRN
  Administered 2016-02-18: 200 mg via INTRAVENOUS

## 2016-02-18 MED ORDER — ONDANSETRON HCL 4 MG/2ML IJ SOLN
4.0000 mg | Freq: Four times a day (QID) | INTRAMUSCULAR | Status: DC | PRN
  Administered 2016-02-19 – 2016-02-21 (×5): 4 mg via INTRAVENOUS
  Filled 2016-02-18 (×5): qty 2

## 2016-02-18 MED ORDER — ROCURONIUM BROMIDE 100 MG/10ML IV SOLN
INTRAVENOUS | Status: DC | PRN
  Administered 2016-02-18: 40 mg via INTRAVENOUS
  Administered 2016-02-18 (×3): 10 mg via INTRAVENOUS

## 2016-02-18 MED ORDER — LACTATED RINGERS IV SOLN
INTRAVENOUS | Status: DC | PRN
Start: 2016-02-18 — End: 2016-02-18
  Administered 2016-02-18 (×2): via INTRAVENOUS

## 2016-02-18 SURGICAL SUPPLY — 62 items
ATTRACTOMAT 16X20 MAGNETIC DRP (DRAPES) ×3 IMPLANT
BLADE EXTENDED COATED 6.5IN (ELECTRODE) ×3 IMPLANT
CELLS DAT CNTRL 66122 CELL SVR (MISCELLANEOUS) IMPLANT
CHLORAPREP W/TINT 26ML (MISCELLANEOUS) ×3 IMPLANT
CLIP TI LARGE 6 (CLIP) ×3 IMPLANT
CLIP TI MEDIUM 6 (CLIP) ×3 IMPLANT
CLIP TI MEDIUM LARGE 6 (CLIP) ×3 IMPLANT
CONT SPEC 4OZ CLIKSEAL STRL BL (MISCELLANEOUS) ×3 IMPLANT
COVER SURGICAL LIGHT HANDLE (MISCELLANEOUS) ×3 IMPLANT
DECANTER SPIKE VIAL GLASS SM (MISCELLANEOUS) ×3 IMPLANT
DRAPE INCISE IOBAN 66X45 STRL (DRAPES) ×3 IMPLANT
DRAPE WARM FLUID 44X44 (DRAPE) ×3 IMPLANT
DRSG OPSITE POSTOP 4X10 (GAUZE/BANDAGES/DRESSINGS) ×3 IMPLANT
DRSG OPSITE POSTOP 4X12 (GAUZE/BANDAGES/DRESSINGS) IMPLANT
DRSG OPSITE POSTOP 4X8 (GAUZE/BANDAGES/DRESSINGS) IMPLANT
ELECT LIGASURE SHORT 9 REUSE (ELECTRODE) ×3 IMPLANT
ELECT REM PT RETURN 9FT ADLT (ELECTROSURGICAL) ×3
ELECTRODE REM PT RTRN 9FT ADLT (ELECTROSURGICAL) ×2 IMPLANT
GAUZE SPONGE 4X4 12PLY STRL (GAUZE/BANDAGES/DRESSINGS) ×3 IMPLANT
GAUZE SPONGE 4X4 16PLY XRAY LF (GAUZE/BANDAGES/DRESSINGS) IMPLANT
GLOVE BIO SURGEON STRL SZ 6 (GLOVE) ×6 IMPLANT
GLOVE BIO SURGEON STRL SZ 6.5 (GLOVE) ×6 IMPLANT
GOWN STRL REUS W/ TWL LRG LVL3 (GOWN DISPOSABLE) ×4 IMPLANT
GOWN STRL REUS W/TWL LRG LVL3 (GOWN DISPOSABLE) ×2
HEMOSTAT ARISTA ABSORB 3G PWDR (MISCELLANEOUS) ×3 IMPLANT
KIT BASIN OR (CUSTOM PROCEDURE TRAY) ×3 IMPLANT
LIGASURE IMPACT 36 18CM CVD LR (INSTRUMENTS) ×6 IMPLANT
LIQUID BAND (GAUZE/BANDAGES/DRESSINGS) ×3 IMPLANT
LOOP VESSEL MAXI BLUE (MISCELLANEOUS) ×3 IMPLANT
NEEDLE BLUNT 17GA (NEEDLE) ×9 IMPLANT
NEEDLE HYPO 22GX1.5 SAFETY (NEEDLE) ×3 IMPLANT
NS IRRIG 1000ML POUR BTL (IV SOLUTION) IMPLANT
PACK GENERAL/GYN (CUSTOM PROCEDURE TRAY) ×3 IMPLANT
RETRACTOR WND ALEXIS 25 LRG (MISCELLANEOUS) IMPLANT
RTRCTR WOUND ALEXIS 18CM MED (MISCELLANEOUS)
RTRCTR WOUND ALEXIS 25CM LRG (MISCELLANEOUS)
SHEET LAVH (DRAPES) ×3 IMPLANT
SPOGE SURGIFLO 8M (HEMOSTASIS)
SPONGE LAP 18X18 X RAY DECT (DISPOSABLE) ×6 IMPLANT
SPONGE SURGIFLO 8M (HEMOSTASIS) IMPLANT
STAPLER VISISTAT 35W (STAPLE) ×3 IMPLANT
SUCTION POOLE TIP (SUCTIONS) ×3 IMPLANT
SUT MNCRL AB 4-0 PS2 18 (SUTURE) IMPLANT
SUT PDS AB 1 TP1 96 (SUTURE) ×6 IMPLANT
SUT PROLENE 5 0 CC 1 (SUTURE) IMPLANT
SUT VIC AB 0 CT1 36 (SUTURE) ×9 IMPLANT
SUT VIC AB 2-0 CT1 36 (SUTURE) ×6 IMPLANT
SUT VIC AB 2-0 CT2 27 (SUTURE) ×18 IMPLANT
SUT VIC AB 2-0 SH 27 (SUTURE) ×2
SUT VIC AB 2-0 SH 27X BRD (SUTURE) ×4 IMPLANT
SUT VIC AB 3-0 CTX 36 (SUTURE) IMPLANT
SUT VIC AB 3-0 SH 27 (SUTURE) ×6
SUT VIC AB 3-0 SH 27X BRD (SUTURE) ×12 IMPLANT
SUT VICRYL 2 0 18  UND BR (SUTURE) ×1
SUT VICRYL 2 0 18 UND BR (SUTURE) ×2 IMPLANT
SYR 20CC LL (SYRINGE) ×6 IMPLANT
SYR 50ML LL SCALE MARK (SYRINGE) ×9 IMPLANT
TOWEL OR 17X26 10 PK STRL BLUE (TOWEL DISPOSABLE) ×6 IMPLANT
TOWEL OR NON WOVEN STRL DISP B (DISPOSABLE) ×3 IMPLANT
TRAY FOLEY CATH 14FR (SET/KITS/TRAYS/PACK) ×3 IMPLANT
TRAY FOLEY W/METER SILVER 14FR (SET/KITS/TRAYS/PACK) ×3 IMPLANT
UNDERPAD 30X30 INCONTINENT (UNDERPADS AND DIAPERS) ×3 IMPLANT

## 2016-02-18 NOTE — Interval H&P Note (Signed)
History and Physical Interval Note:  02/18/2016 7:09 AM  [name]  has presented today for surgery, with the diagnosis of CARCINOMATOSIS  The various methods of treatment have been discussed with the patient and family. After consideration of risks, benefits and other options for treatment, the patient has consented to  Procedure(s): EXPLORATORY LAPAROTOMY (N/A) LAPAROSCOPIC TOTAL ABDOMINAL HYSTERECTOMY, BILATERAL SALPINGO OOPHORECTOMY (Bilateral) OMENTECTOMY (N/A) RADICAL TUMOR DEBULKING, POSSIBLE BOWEL RESECTION (N/A) as a surgical intervention .  The patient's history has been reviewed, patient examined, no change in status, stable for surgery.  I have reviewed the patient's chart and labs.  Questions were answered to the patient's satisfaction.    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   Donaciano Eva

## 2016-02-18 NOTE — Anesthesia Postprocedure Evaluation (Signed)
Anesthesia Post Note  Patient: Jessica Johnston  Procedure(s) Performed: Procedure(s) (LRB): EXPLORATORY LAPAROTOMY (N/A) OMENTECTOMY (N/A) RADICAL TUMOR DEBULKING (N/A) HYSTERECTOMY ABDOMINAL (N/A) BILATERAL SALPINGO OOPHORECTOMY (Bilateral)  Patient location during evaluation: PACU Anesthesia Type: General Level of consciousness: sedated and patient cooperative Pain management: pain level controlled Vital Signs Assessment: post-procedure vital signs reviewed and stable Respiratory status: spontaneous breathing Cardiovascular status: stable Anesthetic complications: no    Last Vitals:  Filed Vitals:   02/18/16 1152 02/18/16 1246  BP: 126/66 126/68  Pulse: 73 62  Temp: 36.6 C 36.3 C  Resp: 15 14    Last Pain:  Filed Vitals:   02/18/16 1247  PainSc: Asleep                 Nolon Nations

## 2016-02-18 NOTE — Anesthesia Procedure Notes (Signed)
Procedure Name: Intubation Date/Time: 02/18/2016 7:40 AM Performed by: Dione Booze Pre-anesthesia Checklist: Emergency Drugs available, Suction available, Patient being monitored and Patient identified Patient Re-evaluated:Patient Re-evaluated prior to inductionOxygen Delivery Method: Circle system utilized Preoxygenation: Pre-oxygenation with 100% oxygen Intubation Type: IV induction Ventilation: Mask ventilation without difficulty Laryngoscope Size: Mac and 4 Grade View: Grade II Tube size: 7.5 mm Number of attempts: 1 Airway Equipment and Method: Stylet Placement Confirmation: ETT inserted through vocal cords under direct vision,  breath sounds checked- equal and bilateral and positive ETCO2 Secured at: 21 cm Tube secured with: Tape Dental Injury: Teeth and Oropharynx as per pre-operative assessment

## 2016-02-18 NOTE — Op Note (Signed)
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  Surgeon: Thereasa Solo, MD.  Assistant Surgeon: Dr Lahoma Crocker, M.D. Assistant: (an MD assistant was necessary for tissue manipulation, retraction and positioning due to the complexity of the case and hospital policies).   Specimens: Bilateral tubes / ovaries, omentum.    Estimated Blood Loss: 200 mL.    Urine Output: [id number]  Complications: None.   Operative Findings: 10cm omental cake, milliary studding of 75mm tumor coating all peritoneal surfaces of the pelvis, and right colic gutter and at the junction of the right diaphragm and liver. Tumor milliary implants on the mesentery of the transverse colon and on rectum anteriorally. The rectum was adherent to the lower uterine segment with tumor nodules. THere was a tumor plaque on the peritoneum of the bladder reflection on the uterus. The right ovary contained a 3cm cystic mass. The left ovary was non-enlarged but was replaced by nodularity and was densely adherent to the uterus and rectum   This represented an optimal cytoreduction (R1) with no gross visible disease remaining and ablation of all visible milliary sites.   Procedure:   The patient was seen in the Holding Room. The risks, benefits, complications, treatment options, and expected outcomes were discussed with the patient.  The patient concurred with the proposed plan, giving informed consent.   The patient was  identified as Jessica Johnston  and the procedure verified as TAH BSO, omentectomy, tumor debulking. A Time Out was held and the above information confirmed upon entry to the operating room..  After induction of anesthesia, the patient was draped and prepped in the  usual sterile manner.  She was prepped and draped in the normal sterile fashion in the dorsal lithotomy position in padded Allen stirrups with good attention paid to support of the lower back and lower extremities. Position was adjusted for appropriate support. A Foley catheter was placed to gravity.   A midline vertical incision was made and carried through the subcutaneous tissue to the fascia. The fascial incision was made and extended superiorally. The rectus muscles were separated. The peritoneum was identified and entered. Peritoneal incision was extended longitudinally.  The abdominal cavity was entered sharply and without incident. A Bookwalter retractor was then placed. A survey of the abdomen and pelvis revealed the above findings, which were significant for omental disease, right ovarian cystic mass, tumor implants on pelvic peritoneum and anterior rectal wall, involvement of right upper quadrant peritoneum with milliary tumor implants.  The omental cake was dissected free from the transverse colon from the hepatic flexure to the splenic flexure using sharp metzenbaum scissor dissection. The lesser sac was entered. The tumor cake was separated from the mesentery of the transverse colon. The short gastric vessels were sealed with ligasure and the infragastric omentum was separated from the greater curvature of the stomach removing all bulky tumor. Hemostasis was confirmed. The colon was closely inspected and was noted to be intact and hemostatic.   After packing the small bowel into the upper abdomen, we began the hysterectomy and right salpingo-oophorectomy by entering the right pelvic sidewall just posterior to the right round ligament. The pararectal space was developed and the retroperitoneum developed up to the level of the common iliac artery.  The course of the ureter was identified with ease. The right  IP was then skeletonized, and sealed and transected with the ligasure. The ovary was  separated from its peritoneal attachments with the bovie with visualization of the ureter at all times. There were dense adhesions between the rectum and ovaries and the posterior lower uterine segement caused by tumor nodularity (no distinct tumor mass). These were taken down sharply with metzenbaum scissors and the rectovaginal septum was dissected. The sigmoid colon was dissected from its dense tumor attachments to the left ovary using sharp dissection. The left retroperitoneal peritoneum was entered parallel to the sigmoid colon attachments and the left ureter was identified in the left retroperitoneal space. Using sharp and monopolar dissection, the left tube and ovary were freed from their peritoneal adhesions to the pelvis and sigmoid colon. The IP ligament was sealed with ligasure.  The ureters bilaterally were skeletonized with sharp dissection and vessel loops were placed around them. The course of the ureters were followed and the final dissection of posterior peritoneum, rectum and uterosacral ligaments was performed to skeletonize the posterior aspect of the uterine vessels at the isthmus.  The anterior peritoneum was taken down with the bovie with a resection of the anterior peritoneum at the lower uterine segment. The bladder was then separately dissected off of the lower uterine segment. This skeletonized the uterine vessels anteriorally.  The uterine vessels were clamped bilaterally at the uterine isthmus with curved Heaney clamps then the pedicles were transected and suture ligated. Successive passes of straight Heaney clamps were used down the cardinal ligaments bilaterally with each pass medial to the prior. The pedicles were sharply transected and made hemostatic with suture. The bladder was ensured to be below the cervicovaginal junction, then curved clamps were passed across the cervicovaginal junction and the uterus was sharply transected. The vaginal cuff was closed with 0-vicryl on  interrupted figure of 8 suture.  The peritoneal cavity was irrigated and hemostasis was confirmed at all surgical sites and reinforced in the pelvis with arista.  For approximately 1 hour (60 minutes), the argon beam coagulator was employed at 60 watts to ablate all visible milliary tumor nodules on the pelvic peritoneum, anterior rectal wall, left and right paracolic gutter, pouch of morrison, mesentery of transverse colon, right diaphragm and surface of liver.  No residual tumor was present at the completion of the case.   The fascia was reapproximated with #1 looped PDS using a total of two sutures. The subcutaneous layer was then irrigated copiously.  Exparel long acting local anesthetic was infiltrated into the subcutaneous tissues. The skin was closed with staples. The patient tolerated the procedure well.   Sponge, lap and needle counts were correct x 2.   Donaciano Eva, MD

## 2016-02-18 NOTE — H&P (View-Only) (Signed)
Consult Note: Gyn-Onc  Consult was requested by Dr. Toney Rakes for the evaluation of Jessica Johnston 75 y.o. female  CC:  Chief Complaint  Patient presents with  . carcinomatosis    Assessment/Plan:  [name]  is a 75 y.o.  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  I reviewed her CT images with the patient. 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  Due to the atypical presentation on imaging (omental cake without ascites, pelvic masses) I am first recommending confirmatory biopsy of the omental mass.  Presuming gyn primary is determined, we will plan for treatment of advanced ovarian cancer.I discussed that the treatment approach for this disease is typically combination of cytoreductive surgery and chemotherapy. I discussed that sequencing of this can be either with upfront debulking followed by adjuvant chemotherapy sequentially or neoadjuvant chemotherapy followed by an interval cytoreductive attempt, then additional chemotherapy. This latter approach is associated with a reduced perioperative morbidity at the time of surgery. I discussed that the goal of optimal sequencing is to optimise the likelihood that cytoreductive effect can be optimal to less than 1 cm of residual disease, and would not induce morbidity for the patient that would result in a delay of adjuvant chemotherapy. I discussed that it is an individual decision process that takes into account individual patient health, and preference factors, in addition to the apparent tumor distribution on imaging. I discussed that the overall survival observed in patients is equivalent for both approaches provided that there is an optimal cytoreductive effort at the time of surgery (regardless of the timing of that surgery).  Given her relatively low tumor bulk, and no apparent signs of  unresectable disease (to optimal status) and her excellent performance status, I am recommending upfront surgical cytoreduction. The patient agrees to this.  I have scheduled her for an exploratory laparotomy total abdominal hysterectomy, omentectomy, BSO, radical tumor debulking, possible bowel resection. We will assess nutritional status with labs preoperatively. She will require pre-and postoperative prophylactic Lovenox dosing due to her increased risk for VTE events with ovarian cancer an exploratory laparotomy.  I discussed surgical risks with the patient including  bleeding, infection, damage to internal organs (such as bladder,ureters, bowels), blood clot, reoperation and rehospitalization. I discussed anticipated hospital stay and recovery. I discussed that surgery alone is not curative for advanced primary peritoneal ovarian or fallopian tube cancer, but instead chemotherapy is essential to partner with a surgical effort. I discussed that surgery and is complications made to lay starting chemotherapy.  If this is primary peritoneal/ovarian/fallopian tube cancer she will require genetic testing.  HPI: Jessica Johnston is a 75 year old para's woman who is seen in consultation at the request of Dr. Toney Rakes for abdominal peritoneal carcinomatosis and omental caking. The patient has a known history of a 3 cm left ovarian cyst for which she saw my partner approximately 15 months ago. At that time her CA-125 is mildly elevated to 49 in her Roma 1 score was also elevated. However the left ovarian cyst was unilocular, measured only 3 cm, and was long-standing, and therefore suspicion for occult malignancy was low.  In November 2016 she began feeling central and upper abdominal discomfort. Initially she thought it was part of the grieving process she lost her grandson. However when the pain and discomfort persisted she sought evaluation by her providers. A gynecologic evaluation was unremarkable.  However  eventually she underwent CT scan imaging on 01/27/2016 for this persistent midabdominal pain. CT imaging showed a liver containing a few small low-density lesions which were equivocal for the potential of metastatic disease these were subcentimeter in dimension. There was an extensive omental nodularity which was highly worrisome for peritoneal carcinomatosis. A 3.5 cm stable left adnexal simple cyst was identified. This was not typical for ovarian cancer. There were no other obvious signs of metastatic disease including no gross ascites.  The patient is otherwise very healthy. 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 She is not required lymphadenectomy for any of these melanoma lesions. She is no family history significant for breast or ovarian cancer.   Current Meds:  Outpatient Encounter Prescriptions as of 02/08/2016  Medication Sig  . acetaminophen (TYLENOL) 325 MG tablet Take by mouth.  Marland Kitchen aspirin 81 MG tablet Take 81 mg by mouth daily.  . cholecalciferol (VITAMIN D) 1000 UNITS tablet Take 1,000 Units by mouth daily.  Marland Kitchen ezetimibe (ZETIA) 10 MG tablet Take by mouth.  . Pitavastatin Calcium (LIVALO) 2 MG TABS Take by mouth.  . [DISCONTINUED] Efinaconazole 10 % SOLN Apply 1 drop topically daily.  . [DISCONTINUED] NONFORMULARY OR COMPOUNDED ITEM Estradiol 0.02 % 79ml prefilled applicator Sig: apply twice a week (Patient not taking: Reported on 10/21/2015)  . [DISCONTINUED] rosuvastatin (CRESTOR) 20 MG tablet Take 20 mg by mouth daily. Reported on 10/21/2015   No facility-administered encounter medications on file as of 02/08/2016.    Allergy: No Known Allergies  Social Hx:   Social History   Social History  . Marital Status: Married    Spouse Name: N/A  . Number of Children: N/A  . Years of Education: N/A   Occupational History  . Not on file.   Social History Main Topics  . Smoking status: Never Smoker    . Smokeless tobacco: Never Used  . Alcohol Use: 0.0 oz/week    0 Standard drinks or equivalent per week  . Drug Use: No  . Sexual Activity: No   Other Topics Concern  . Not on file   Social History Narrative    Past Surgical Hx:  Past Surgical History  Procedure Laterality Date  . Tonsillectomy and adenoidectomy  1948  . Hysteroscopy      Past Medical Hx:  Past Medical History  Diagnosis Date  . High cholesterol   . Cancer ([place])     MELANOMA    Past Gynecological History:  SVD x 2  No LMP recorded. Patient is postmenopausal.  Family Hx: History reviewed. No pertinent family history.  Review of Systems:  Constitutional  Feels well,    ENT Normal appearing ears and nares bilaterally Skin/Breast  No rash, sores, jaundice, itching, dryness Cardiovascular  No chest pain, shortness of breath, or edema  Pulmonary  No cough or wheeze.  Gastro Intestinal  + abdominal pain, +early satiety, no change in bowel habit Genito Urinary  No frequency, urgency, dysuria, no bleeding Musculo Skeletal  No myalgia, arthralgia, joint swelling or pain  Neurologic  No weakness, numbness, change in gait,  Psychology  No depression, anxiety, insomnia.   Vitals:  Blood pressure 123/59, pulse 79, temperature 98.2 F (36.8 C), temperature source Oral, resp. rate 18, height 5\' 4"  (1.626 m), weight 151 lb 1.6 oz (68.539 kg), SpO2 99 %.  Physical Exam: WD in NAD Neck  Supple NROM, without  any enlargements.  Lymph Node Survey No cervical supraclavicular or inguinal adenopathy Cardiovascular  Pulse normal rate, regularity and rhythm. S1 and S2 normal.  Lungs  Clear to auscultation bilateraly, without wheezes/crackles/rhonchi. Good air movement.  Skin  No rash/lesions/breakdown  Psychiatry  Alert and oriented to person, place, and time  Abdomen  Normoactive bowel sounds, abdomen soft, non-tender and mildly overweight (BMI 26) without evidence of hernia.  Back No CVA  tenderness Genito Urinary  Vulva/vagina: Normal external female genitalia.  No lesions. No discharge or bleeding.  Bladder/urethra:  No lesions or masses, well supported bladder  Vagina: normal  Cervix: Normal appearing, no lesions.  Uterus:  Small, mobile, no parametrial involvement or nodularity.  Adnexa: no palpable masses. Rectal  Good tone, no masses no cul de sac nodularity.  Extremities  No bilateral cyanosis, clubbing or edema.   Donaciano Eva, MD  02/08/2016, 11:47 AM

## 2016-02-18 NOTE — Transfer of Care (Signed)
Immediate Anesthesia Transfer of Care Note  Patient: Jessica Johnston  Procedure(s) Performed: Procedure(s): EXPLORATORY LAPAROTOMY (N/A) OMENTECTOMY (N/A) RADICAL TUMOR DEBULKING (N/A) HYSTERECTOMY ABDOMINAL (N/A) BILATERAL SALPINGO OOPHORECTOMY (Bilateral)  Patient Location: PACU  Anesthesia Type:General  Level of Consciousness: awake, sedated, patient cooperative and responds to stimulation  Airway & Oxygen Therapy: Patient Spontanous Breathing and Patient connected to face mask oxygen  Post-op Assessment: Report given to RN and Post -op Vital signs reviewed and stable  Post vital signs: Reviewed and stable  Last Vitals:  Filed Vitals:   02/18/16 0527  BP: 127/66  Pulse: 73  Temp: 36.6 C  Resp: 16    Last Pain: There were no vitals filed for this visit.       Complications: No apparent anesthesia complications

## 2016-02-19 LAB — BASIC METABOLIC PANEL WITH GFR
Anion gap: 8 (ref 5–15)
BUN: 12 mg/dL (ref 6–20)
CO2: 25 mmol/L (ref 22–32)
Calcium: 8.5 mg/dL — ABNORMAL LOW (ref 8.9–10.3)
Chloride: 106 mmol/L (ref 101–111)
Creatinine, Ser: 0.48 mg/dL (ref 0.44–1.00)
GFR calc Af Amer: >60 mL/min
GFR calc non Af Amer: >60 mL/min
Glucose, Bld: 154 mg/dL — ABNORMAL HIGH (ref 65–99)
Potassium: 4.3 mmol/L (ref 3.5–5.1)
Sodium: 139 mmol/L (ref 135–145)

## 2016-02-19 LAB — CBC
HCT: 35.4 % — ABNORMAL LOW (ref 36.0–46.0)
HEMOGLOBIN: 11.7 g/dL — AB (ref 12.0–15.0)
MCH: 29.7 pg (ref 26.0–34.0)
MCHC: 33.1 g/dL (ref 30.0–36.0)
MCV: 89.8 fL (ref 78.0–100.0)
Platelets: 287 10*3/uL (ref 150–400)
RBC: 3.94 MIL/uL (ref 3.87–5.11)
RDW: 13.4 % (ref 11.5–15.5)
WBC: 9 10*3/uL (ref 4.0–10.5)

## 2016-02-19 LAB — HEMOGLOBIN AND HEMATOCRIT, BLOOD
HCT: 37.7 % (ref 36.0–46.0)
Hemoglobin: 12.1 g/dL (ref 12.0–15.0)

## 2016-02-19 MED ORDER — ENOXAPARIN (LOVENOX) PATIENT EDUCATION KIT
PACK | Freq: Once | Status: AC
  Administered 2016-02-19: 10:00:00
  Filled 2016-02-19: qty 1

## 2016-02-19 NOTE — Progress Notes (Signed)
1 Day Post-Op Procedure(s) (LRB): EXPLORATORY LAPAROTOMY (N/A) OMENTECTOMY (N/A) RADICAL TUMOR DEBULKING (N/A) HYSTERECTOMY ABDOMINAL (N/A) BILATERAL SALPINGO OOPHORECTOMY (Bilateral)  Subjective: Patient reports minimal pain. Emesis last night but tolerated PO this morning. Foley out but yet to void. No flatus as yet.    Objective: Vital signs in last 24 hours: Temp:  [97.3 F (36.3 C)-98.7 F (37.1 C)] 98.2 F (36.8 C) (05/05 0600) Pulse Rate:  [62-80] 76 (05/05 0600) Resp:  [11-16] 16 (05/05 0600) BP: (104-141)/(49-71) 105/60 mmHg (05/05 0600) SpO2:  [95 %-100 %] 95 % (05/05 0600) Last BM Date: 02/18/16  Intake/Output from previous day: 05/04 0701 - 05/05 0700 In: 3595 [P.O.:960; I.V.:2585; IV Piggyback:50] Out: 1800 [Urine:1600; Blood:200]  Physical Examination: General: alert and cooperative Resp: clear to auscultation bilaterally Cardio: regular rate and rhythm, S1, S2 normal, no murmur, click, rub or gallop GI: soft, non-tender; bowel sounds normal; no masses,  no organomegaly Extremities: extremities normal, atraumatic, no cyanosis or edema incision clean, dry and intact with honeycomb dressing  Labs: WBC/Hgb/Hct/Plts:  9.0/11.7/35.4/287 (05/05 [id number]) BUN/Cr/glu/ALT/AST/amyl/lip:  12/0.48/--/--/--/--/-- (05/05 [id number])   Assessment:  75 y.o. s/p Procedure(s): EXPLORATORY LAPAROTOMY OMENTECTOMY RADICAL TUMOR DEBULKING HYSTERECTOMY ABDOMINAL BILATERAL SALPINGO OOPHORECTOMY: stable Pain:  Pain is well-controlled on oral medications.  Heme: stable and appropriate postop Hb. No signs of acute blood loss or active bleeding.  GI:  Tolerating po: Yes . FEN: d.c. IVF's  Prophylaxis: pharmacologic prophylaxis (with any of the following: enoxaparin (Lovenox) 40mg  SQ 2 hours prior to surgery then every day). Continue for 28 days postop.  Plan: Advance diet Encourage ambulation Advance to PO medication Discontinue IV fluids Dispo:  Discharge plan to include: The  patient is to be discharged to home likely Saturday or Sunday.   LOS: 1 day    Donaciano Eva 02/19/2016, 9:17 AM

## 2016-02-19 NOTE — Progress Notes (Signed)
Utilization review completed.  

## 2016-02-19 NOTE — Progress Notes (Signed)
[facility] notified of pts low BP. Patient asymptomatic. Encouraged to drink more fluid and to stand slowly.

## 2016-02-19 NOTE — Discharge Instructions (Signed)
02/19/2016  Return to work: in 6 weeeks  Activity: 1. Be up and out of the bed during the day.  Take a nap if needed.  You may walk up steps but be careful and use the hand rail.  Stair climbing will tire you more than you think, you may need to stop part way and rest.   2. No lifting or straining for 6 weeks.  3. No driving for 2 weeks.  Do Not drive if you are taking narcotic pain medicine.  4. Shower daily.  Use soap and water on your incision and pat dry; don't rub.   5. No sexual activity and nothing in the vagina for 6 weeks.  Diet: 1. Low sodium Heart Healthy Diet is recommended.  2. It is safe to use a laxative if you have difficulty moving your bowels.   Medications:  Minimise use of narcotic medications by first trying tylenol or ibuprofen for your pain. You will need to use lovenox injections for 28 days after surgery.  Wound Care: 1. Keep clean and dry.  Shower daily. 2. You may remove your dressing in 7 days after surgery. It can get wet before this time. After its removal you can get your incision wet in the shower.  Reasons to call the Doctor:   Fever - Oral temperature greater than 100.4 degrees Fahrenheit  Foul-smelling vaginal discharge  Difficulty urinating  Nausea and vomiting  Increased pain at the site of the incision that is unrelieved with pain medicine.  Difficulty breathing with or without chest pain  New calf pain especially if only on one side  Sudden, continuing increased vaginal bleeding with or without clots.   Follow-up: 1. See Everitt Amber in 2 weeks.  Contacts: For questions or concerns you should contact:  Dr. Everitt Amber at [phone]. After hours this will connect you with the on call physician.  or at [place]

## 2016-02-20 LAB — URINALYSIS, ROUTINE W REFLEX MICROSCOPIC
Bilirubin Urine: NEGATIVE
Glucose, UA: NEGATIVE mg/dL
KETONES UR: NEGATIVE mg/dL
LEUKOCYTES UA: NEGATIVE
NITRITE: NEGATIVE
PROTEIN: NEGATIVE mg/dL
Specific Gravity, Urine: 1.027 (ref 1.005–1.030)
pH: 6 (ref 5.0–8.0)

## 2016-02-20 LAB — CBC
HEMATOCRIT: 32.9 % — AB (ref 36.0–46.0)
Hemoglobin: 10.7 g/dL — ABNORMAL LOW (ref 12.0–15.0)
MCH: 29.9 pg (ref 26.0–34.0)
MCHC: 32.5 g/dL (ref 30.0–36.0)
MCV: 91.9 fL (ref 78.0–100.0)
PLATELETS: 228 10*3/uL (ref 150–400)
RBC: 3.58 MIL/uL — AB (ref 3.87–5.11)
RDW: 13.6 % (ref 11.5–15.5)
WBC: 7.1 10*3/uL (ref 4.0–10.5)

## 2016-02-20 LAB — URINE MICROSCOPIC-ADD ON: WBC UA: NONE SEEN WBC/hpf (ref 0–5)

## 2016-02-20 MED ORDER — ENOXAPARIN SODIUM 40 MG/0.4ML ~~LOC~~ SOLN: 40.0000 mg | SUBCUTANEOUS | Status: DC

## 2016-02-20 MED ORDER — OXYCODONE HCL 5 MG PO TABS: 5.0000 mg | ORAL_TABLET | ORAL | Status: DC | PRN

## 2016-02-20 MED ORDER — BISACODYL 10 MG RE SUPP
10.0000 mg | Freq: Once | RECTAL | Status: AC
  Administered 2016-02-20: 10 mg via RECTAL
  Filled 2016-02-20: qty 1

## 2016-02-20 NOTE — Progress Notes (Signed)
Patient ID: Jessica Johnston, female   DOB: July 06, 1941, 75 y.o.   MRN: [id number] 2 Days Post-Op Procedure(s) (LRB): EXPLORATORY LAPAROTOMY (N/A) OMENTECTOMY (N/A) RADICAL TUMOR DEBULKING (N/A) HYSTERECTOMY ABDOMINAL (N/A) BILATERAL SALPINGO OOPHORECTOMY (Bilateral)  Subjective: Patient reports minimal pain. Voiding without difficulty. No flatus as yet.    Objective: Vital signs in last 24 hours: Temp:  [97.5 F (36.4 C)-99 F (37.2 C)] 98 F (36.7 C) (05/06 0645) Pulse Rate:  [66-83] 72 (05/06 0645) Resp:  [16-18] 16 (05/06 0645) BP: (89-110)/(48-60) 93/50 mmHg (05/06 0645) SpO2:  [94 %-95 %] 95 % (05/06 0645) Last BM Date: 02/18/16  Intake/Output from previous day: 05/05 0701 - 05/06 0700 In: 960 [P.O.:960] Out: 1450 [Urine:1450]  Physical Examination: General: alert and cooperative Resp: clear to auscultation bilaterally Cardio: regular rate and rhythm, S1, S2 normal, no murmur, click, rub or gallop GI: softly distended, non-tender; bowel sounds normal; no masses,  no organomegaly Extremities: extremities normal, atraumatic, no cyanosis or edema incision clean, dry and intact with honeycomb dressing  Labs: WBC/Hgb/Hct/Plts:  7.1/10.7/32.9/228 (05/06 0440)     Assessment:  75 y.o. s/p Procedure(s): EXPLORATORY LAPAROTOMY OMENTECTOMY RADICAL TUMOR DEBULKING HYSTERECTOMY ABDOMINAL BILATERAL SALPINGO OOPHORECTOMY: stable Pain:  Pain is well-controlled on oral medications.  Heme: Stable postop Hb  GI:  Tolerating po: Yes . FEN: ?mild hypovolemia  Prophylaxis: pharmacologic prophylaxis (with any of the following: enoxaparin (Lovenox) 40mg  SQ 2 hours prior to surgery then every day). Continue for 28 days postop.  Plan: Dulcolax suppository Encourage ambulation Dispo:  Discharge plan to include: The patient is to be discharged to home possibly later today or Sunday.   LOS: 2 days    JACKSON-MOORE,Maico Mulvehill A 02/20/2016, 9:19 AM

## 2016-02-21 LAB — BASIC METABOLIC PANEL
ANION GAP: 9 (ref 5–15)
BUN: 9 mg/dL (ref 6–20)
CALCIUM: 8.5 mg/dL — AB (ref 8.9–10.3)
CO2: 24 mmol/L (ref 22–32)
Chloride: 105 mmol/L (ref 101–111)
Creatinine, Ser: 0.46 mg/dL (ref 0.44–1.00)
Glucose, Bld: 104 mg/dL — ABNORMAL HIGH (ref 65–99)
Potassium: 4.1 mmol/L (ref 3.5–5.1)
SODIUM: 138 mmol/L (ref 135–145)

## 2016-02-21 LAB — CBC
HCT: 32.4 % — ABNORMAL LOW (ref 36.0–46.0)
HEMOGLOBIN: 10.5 g/dL — AB (ref 12.0–15.0)
MCH: 28.8 pg (ref 26.0–34.0)
MCHC: 32.4 g/dL (ref 30.0–36.0)
MCV: 89 fL (ref 78.0–100.0)
Platelets: 224 10*3/uL (ref 150–400)
RBC: 3.64 MIL/uL — AB (ref 3.87–5.11)
RDW: 13.3 % (ref 11.5–15.5)
WBC: 5.9 10*3/uL (ref 4.0–10.5)

## 2016-02-21 LAB — URINALYSIS, ROUTINE W REFLEX MICROSCOPIC
BILIRUBIN URINE: NEGATIVE
Glucose, UA: NEGATIVE mg/dL
Hgb urine dipstick: NEGATIVE
Ketones, ur: NEGATIVE mg/dL
Leukocytes, UA: NEGATIVE
NITRITE: NEGATIVE
Protein, ur: NEGATIVE mg/dL
SPECIFIC GRAVITY, URINE: 1.008 (ref 1.005–1.030)
pH: 6 (ref 5.0–8.0)

## 2016-02-21 LAB — MAGNESIUM: MAGNESIUM: 1.9 mg/dL (ref 1.7–2.4)

## 2016-02-21 MED ORDER — BISACODYL 10 MG RE SUPP
10.0000 mg | Freq: Once | RECTAL | Status: AC
  Administered 2016-02-21: 10 mg via RECTAL
  Filled 2016-02-21: qty 1

## 2016-02-21 MED ORDER — DEXTROSE-NACL 5-0.45 % IV SOLN
INTRAVENOUS | Status: DC
  Administered 2016-02-21: 11:00:00 via INTRAVENOUS

## 2016-02-21 NOTE — Progress Notes (Signed)
Patient ID: JAMARIANA SFORZA, female   DOB: Mar 24, 1941, 75 y.o.   MRN: [id number] 3 Days Post-Op Procedure(s) (LRB): EXPLORATORY LAPAROTOMY (N/A) OMENTECTOMY (N/A) RADICAL TUMOR DEBULKING (N/A) HYSTERECTOMY ABDOMINAL (N/A) BILATERAL SALPINGO OOPHORECTOMY (Bilateral)  Subjective: Patient reports occasional episodes of pain, nausea, anorexia, lightheadedness. Flatus and a small stool.    Objective: Vital signs in last 24 hours: Temp:  [97.8 F (36.6 C)-98.6 F (37 C)] 98.1 F (36.7 C) (05/07 0541) Pulse Rate:  [67-85] 73 (05/07 0541) Resp:  [15-16] 15 (05/07 0541) BP: (113-127)/(56-67) 127/67 mmHg (05/07 0541) SpO2:  [95 %-97 %] 97 % (05/07 0541) Last BM Date: 02/18/16  Intake/Output from previous day: 05/06 0701 - 05/07 0700 In: 600 [P.O.:600] Out: 550 [Urine:550]  Physical Examination: General: alert and cooperative Resp: clear to auscultation bilaterally Cardio: regular rate and rhythm, S1, S2 normal, no murmur, click, rub or gallop GI: softly distended, non-tender; bowel sounds normal; no masses,  no organomegaly Extremities: extremities normal, atraumatic, no cyanosis or edema incision clean, dry and intact with honeycomb dressing  Labs:       Assessment:  75 y.o. s/p Procedure(s): EXPLORATORY LAPAROTOMY OMENTECTOMY RADICAL TUMOR DEBULKING HYSTERECTOMY ABDOMINAL BILATERAL SALPINGO OOPHORECTOMY: stable Pain:  Pain is well-controlled on oral medications.  GI:  Tolerating po: ?slow return of bowel function vs mild ileus . FEN: ?mild hypovolemia--B/P trending up  Prophylaxis: pharmacologic prophylaxis (with any of the following: enoxaparin (Lovenox) 40mg  SQ 2 hours prior to surgery then every day). Continue for 28 days postop.  Plan: Dulcolax suppository--repeat Labs IV hydration Oral intake as tolerated Encourage ambulation--with assistance     LOS: 3 days    JACKSON-MOORE,Avabella Wailes A 02/21/2016, 9:00 AM

## 2016-02-22 ENCOUNTER — Telehealth: Payer: Self-pay | Admitting: *Deleted

## 2016-02-22 NOTE — Discharge Summary (Signed)
Physician Discharge Summary  Patient ID: Jessica Johnston MRN: [id number] DOB/AGE: 1941-09-16 75 y.o.  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 Surgery was uncomplicated. The patient did well postoperatively with some mild dehydration on POD 1 and slow return of GI function on POD 3.  On POD 4 she was meeting discharge criteria and will be sent home on 4 weeks of prophylactic lovenos  Consults: None  Significant Diagnostic Studies: labs:  CBC    Component Value Date/Time   WBC 5.9 02/21/2016 0936   RBC 3.64* 02/21/2016 0936   HGB 10.5* 02/21/2016 0936   HCT 32.4* 02/21/2016 0936   PLT 224 02/21/2016 0936   MCV 89.0 02/21/2016 0936   MCH 28.8 02/21/2016 0936   MCHC 32.4 02/21/2016 0936   RDW 13.3 02/21/2016 0936   LYMPHSABS 1.3 02/16/2016 1115   MONOABS 0.4 02/16/2016 1115   EOSABS 0.1 02/16/2016 1115   BASOSABS 0.0 02/16/2016 1115     Treatments: IV hydration and surgery: see above  Discharge Exam: Blood pressure 97/68, pulse 73, temperature 98.1 F (36.7 C), temperature source Axillary, resp. rate 16, height 5\' 4"  (1.626 m), weight 152 lb (68.947 kg), SpO2 95 %. General appearance: alert and cooperative Resp: clear to auscultation bilaterally Cardio: regular rate and rhythm, S1, S2 normal, no murmur, click, rub or gallop GI: soft, non-tender; bowel sounds normal; no masses,  no organomegaly Pelvic: no bleeding Incision/Wound:clean, dry and in tact  Disposition: 01-Home or Self Care  Discharge  Instructions    (HEART FAILURE PATIENTS) Call MD:  Anytime you have any of the following symptoms: 1) 3 pound weight gain in 24 hours or 5 pounds in 1 week 2) shortness of breath, with or without a dry hacking cough 3) swelling in the hands, feet or stomach 4) if you have to sleep on extra pillows at night in order to breathe.    Complete by:  As directed      Call MD for:  difficulty breathing, headache or visual disturbances    Complete by:  As directed      Call MD for:  extreme fatigue    Complete by:  As directed      Call MD for:  hives    Complete by:  As directed      Call MD for:  persistant dizziness or light-headedness    Complete by:  As directed      Call MD for:  persistant nausea and vomiting    Complete by:  As directed      Call MD for:  redness, tenderness, or signs of infection (pain, swelling, redness, odor or green/yellow discharge around incision site)    Complete by:  As directed      Call MD for:  severe uncontrolled pain    Complete by:  As directed      Call MD for:  temperature >100.4    Complete by:  As directed      Diet - low sodium heart healthy    Complete by:  As directed      Diet  general    Complete by:  As directed      Driving Restrictions    Complete by:  As directed   No driving for 7 days or until off narcotic pain medication     Increase activity slowly    Complete by:  As directed      Remove dressing in 24 hours    Complete by:  As directed      Sexual Activity Restrictions    Complete by:  As directed   No intercourse for 6 weeks            Medication List    TAKE these medications        acetaminophen 500 MG tablet  Commonly known as:  TYLENOL  Take 1,000 mg by mouth every 6 (six) hours as needed (For pain.).     aspirin 81 MG tablet  Take 81 mg by mouth daily.     enoxaparin 40 MG/0.4ML injection  Commonly known as:  LOVENOX  Inject 0.4 mLs (40 mg total) into the skin daily.     ezetimibe 10 MG tablet  Commonly known  as:  ZETIA  Take 10 mg by mouth daily.     LIVALO 2 MG Tabs  Generic drug:  Pitavastatin Calcium  Take 2 mg by mouth daily.     oxyCODONE 5 MG immediate release tablet  Commonly known as:  Oxy IR/ROXICODONE  Take 1 tablet (5 mg total) by mouth every 4 (four) hours as needed for severe pain or breakthrough pain.     Vitamin D3 2000 units Tabs  Take 2,000 Units by mouth daily.           Follow-up Information    Follow up with Donaciano Eva, MD In 2 weeks.   Specialty:  Obstetrics and Gynecology   Why:  For wound re-check   Contact information:   [place] [id number] [phone]       Signed: Donaciano Eva 02/22/2016, 9:53 AM

## 2016-02-22 NOTE — Care Management Important Message (Signed)
Important Message  Patient Details  Name: Jessica Johnston MRN: [id number] Date of Birth: 05/29/1941   Medicare Important Message Given:  Yes    Camillo Flaming 02/22/2016, 11:08 AMImportant Message  Patient Details  Name: Jessica Johnston MRN: [id number] Date of Birth: 05-21-1941   Medicare Important Message Given:  Yes    Camillo Flaming 02/22/2016, 11:08 AM

## 2016-02-22 NOTE — Telephone Encounter (Signed)
Notified pt of scheduled appointment. Pt is scheduled for f/u appointment on may 22. Pt agreed with time and date of appointment

## 2016-02-22 NOTE — Progress Notes (Signed)
Patient is alert and oriented, vital signs are stable, incisions are within normal limits, pain controlled with oxycodone 5 mg and effective, patient is as tolerating her diet without compliants of nausea or vomiting, patient up and ambulating, patient and spouse taught regarding lovenox injections for home administration, questions and concerns answered patient is to follow up with MD Neta Mends RN 11:17 AM 02-22-2016

## 2016-02-23 ENCOUNTER — Telehealth: Payer: Self-pay | Admitting: Gynecologic Oncology

## 2016-02-23 NOTE — Telephone Encounter (Signed)
Returned call to patient.  Questions answered about pain medication schedule and bowel regimen.  Advised to call for any concerns or questions.  All questions answered.

## 2016-02-24 ENCOUNTER — Telehealth: Payer: Self-pay | Admitting: Gynecologic Oncology

## 2016-02-24 NOTE — Telephone Encounter (Signed)
Returned call to patient.  Patient stating her friend said her mother could not take oxycodone because it made her nauseous.  Patient stating she has no appetite but she is not nauseated.  Wanting to discuss possibly trying a different medication.  After discussing vicodin and tramadol, pt wants to stick with her current medication, percocet, and will call if she feels it is not helping with pain relief or nausea develops.  Advised to call for any needs or concerns.

## 2016-02-25 ENCOUNTER — Telehealth: Payer: Self-pay | Admitting: Gynecologic Oncology

## 2016-02-25 DIAGNOSIS — C5701 Malignant neoplasm of right fallopian tube: Secondary | ICD-10-CM

## 2016-02-25 NOTE — Telephone Encounter (Signed)
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 Recommend 6 cycles of carboplatin and paclitaxel.  We will have her seen by Dr Marko Plume to discuss further.  All questions answered.  Donaciano Eva, MD

## 2016-02-26 ENCOUNTER — Telehealth: Payer: Self-pay | Admitting: Gynecologic Oncology

## 2016-02-26 NOTE — Telephone Encounter (Signed)
Returned call to patient's son.  All questions answered about chemotherapy and future appointments.  Advised to call for any needs.  Phone call lasted 10 min.

## 2016-03-07 ENCOUNTER — Encounter: Payer: Self-pay | Admitting: Gynecologic Oncology

## 2016-03-07 ENCOUNTER — Ambulatory Visit: Payer: Medicare Other | Attending: Gynecologic Oncology | Admitting: Gynecologic Oncology

## 2016-03-07 ENCOUNTER — Ambulatory Visit
Admit: 2016-03-07 | Discharge: 2016-03-07 | Payer: MEDICARE | Attending: Obstetrics & Gynecology | Primary: Family Medicine

## 2016-03-07 VITALS — BP 109/50 | HR 70 | Temp 98.1°F | Resp 18 | Ht 64.0 in | Wt 145.6 lb

## 2016-03-07 DIAGNOSIS — Z01411 Encounter for gynecological examination (general) (routine) with abnormal findings: Secondary | ICD-10-CM

## 2016-03-07 DIAGNOSIS — Z90710 Acquired absence of both cervix and uterus: Secondary | ICD-10-CM

## 2016-03-07 DIAGNOSIS — C5700 Malignant neoplasm of unspecified fallopian tube: Secondary | ICD-10-CM | POA: Diagnosis present

## 2016-03-07 DIAGNOSIS — C5702 Malignant neoplasm of left fallopian tube: Secondary | ICD-10-CM

## 2016-03-07 DIAGNOSIS — Z7982 Long term (current) use of aspirin: Secondary | ICD-10-CM | POA: Insufficient documentation

## 2016-03-07 DIAGNOSIS — E7800 Pure hypercholesterolemia, unspecified: Secondary | ICD-10-CM | POA: Diagnosis not present

## 2016-03-07 DIAGNOSIS — Z85820 Personal history of malignant melanoma of skin: Secondary | ICD-10-CM | POA: Insufficient documentation

## 2016-03-07 DIAGNOSIS — C786 Secondary malignant neoplasm of retroperitoneum and peritoneum: Secondary | ICD-10-CM

## 2016-03-07 DIAGNOSIS — Z7189 Other specified counseling: Secondary | ICD-10-CM

## 2016-03-07 DIAGNOSIS — R51 Headache: Secondary | ICD-10-CM | POA: Diagnosis not present

## 2016-03-07 NOTE — Patient Instructions (Signed)
Well Visit, Over 24: Care Instructions  Your Care Instructions  Physical exams can help you stay healthy. Your doctor has checked your overall health and may have suggested ways to take good care of yourself. He or she also may have recommended tests. At home, you can help prevent illness with healthy eating, regular exercise, and other steps.  Follow-up care is a key part of your treatment and safety. Be sure to make and go to all appointments, and call your doctor if you are having problems. It's also a good idea to know your test results and keep a list of the medicines you take.  How can you care for yourself at home?  ?? Reach and stay at a healthy weight. This will lower your risk for many problems, such as obesity, diabetes, heart disease, and high blood pressure.  ?? Get at least 30 minutes of exercise on most days of the week. Walking is a good choice. You also may want to do other activities, such as running, swimming, cycling, or playing tennis or team sports.  ?? Do not smoke. Smoking can make health problems worse. If you need help quitting, talk to your doctor about stop-smoking programs and medicines. These can increase your chances of quitting for good.  ?? Protect your skin from too much sun. When you're outdoors from 10 a.m. to 4 p.m., stay in the shade or cover up with clothing and a hat with a wide brim. Wear sunglasses that block UV rays. Even when it's cloudy, put broad-spectrum sunscreen (SPF 30 or higher) on any exposed skin.  ?? See a dentist one or two times a year for checkups and to have your teeth cleaned.  ?? Wear a seat belt in the car.  ?? Limit alcohol to 2 drinks a day for men and 1 drink a day for women. Too much alcohol can cause health problems.  Follow your doctor's advice about when to have certain tests. These tests can spot problems early.  For men and women  ?? Cholesterol. Your doctor will tell you how often to have this done based  on your overall health and other things that can increase your risk for heart attack and stroke.  ?? Blood pressure. Have your blood pressure checked during a routine doctor visit. Your doctor will tell you how often to check your blood pressure based on your age, your blood pressure results, and other factors.  ?? Diabetes. Ask your doctor whether you should have tests for diabetes.  ?? Vision. Experts recommend that you have yearly exams for glaucoma and other age-related eye problems.  ?? Hearing. Tell your doctor if you notice any change in your hearing. You can have tests to find out how well you hear.  ?? Colon cancer tests. Keep having colon cancer tests as your doctor recommends. You can have one of several types of tests.  ?? Heart attack and stroke risk. At least every 4 to 6 years, you should have your risk for heart attack and stroke assessed. Your doctor uses factors such as your age, blood pressure, cholesterol, and whether you smoke or have diabetes to show what your risk for a heart attack or stroke is over the next 10 years.  ?? Osteoporosis. Talk to your doctor about whether you should have a bone density test to find out whether you have thinning bones. Also ask your doctor about whether you should take calcium and vitamin D supplements.  For women  ?? Pap test  and pelvic exam. You may no longer need a Pap test. Talk with your doctor about whether to stop or continue to have Pap tests.  ?? Breast exam and mammogram. Ask how often you should have a mammogram, which is an X-ray of your breasts. A mammogram can spot breast cancer before it can be felt and when it is easiest to treat.  ?? Thyroid disease. Talk to your doctor about whether to have your thyroid checked as part of a regular physical exam. Women have an increased chance of a thyroid problem.  For men  ?? Prostate exam. Talk to your doctor about whether you should have a blood test (called a PSA test) for prostate cancer. Experts disagree on whether  men should have this test. Some experts recommend that you discuss the benefits and risks of the test with your doctor.  ?? Abdominal aortic aneurysm. Ask your doctor whether you should have a test to check for an aneurysm. You may need a test if you ever smoked or if your parent, brother, sister, or child has had an aneurysm.  When should you call for help?  Watch closely for changes in your health, and be sure to contact your doctor if you have any problems or symptoms that concern you.  Where can you learn more?  Go to [email or web address].  Enter [phone] in the search box to learn more about "Well Visit, Over 65: Care Instructions."  Current as of: May 05, 2015  Content Version: 11.2  ?? 2006-2017 [employer]. Care instructions adapted under license by [employer] (which disclaims liability or warranty for this information). If you have questions about a medical condition or this instruction, always ask your healthcare professional. [place] any warranty or liability for your use of this information.

## 2016-03-07 NOTE — Progress Notes (Signed)
Annual exam ages 53+ post hysterectomy  (New Patient)    Dawn Green is a G3 P3003,  75 y.o. female [place] No LMP recorded. Patient has had a hysterectomy.  TAH/BSO for fibroid (1997).  She presents for her annual checkup.     She is having vaginal dryness, pain with intercourse, decreased libido.  Has tried Replens, Vagisil.  KY or generic with IC.  Only used the Replens a couple of times when she was expecting to be sexually active.  Has not used regularly.    Had normal colonoscopy a couple of years ago. Rpt 58yr.    Hormonal status:  Was on hormone replacement after TAH/BSO.  Estrogen patch for at least 152yr    Sexual history:    She  reports that she currently engages in sexual activity and has had female partners. She reports using the following method of birth control/protection: Surgical.    Medical conditions:    Since her last annual GYN exam about three or more years ago, she has not the following changes in her health history: none.   Surgical history confirmed with patient.  has a past surgical history that includes tah and bso (1997); cataract removal (Bilateral); breast surgery procedure unlisted (Right, 2014); breast biopsy (Left); and tubal ligation (1972).    Pap and Mammogram History:    Her most recent Pap smear was normal obtained in 1996.  No h/o abnl paps.    The patient had a recent mammogram 02/05/16 which was negative for malignancy.    Breast Cancer History/Substance Abuse: Negative      Osteoporosis History:    Family history does not include a first or second degree relative with osteopenia or osteoporosis.    A bone density scan was obtained 2016 and revealed osteopenia.   She is currently taking calcium and vit D.    Past Medical History:   Diagnosis Date   ??? [place]    [place] - Lum[place]    ??? Diabetes ([place]   ??? Glaucoma    ??? Hx of mammogram 02/05/2016    Negative per pt. Sees Dr. IrLaurena Bering  ??? Hyperlipemia    ??? Hypertension    ??? 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 Other ill-defined conditions 2016    Mild URI x 4 months (allergies)    ??? Routine Papanicolaou smear 1996    Negative per pt.    ??? 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 BREAST SURGERY PROCEDURE UNLISTED Right 2014    Right breast lumpectomy   ??? HX BREAST BIOPSY Left     x 2 negative biopsies    ??? HX CATARACT REMOVAL Bilateral     w/ IOL implants   ??? HX TAH AND BSO  1997   ??? HX TUBAL LIGATION  1972       Current Outpatient Prescriptions   Medication Sig Dispense Refill   ??? Cetirizine (ZYRTEC) 10 mg cap Take  by mouth.     ??? venlafaxine-SR (EFFEXOR-XR) 37.5 mg capsule Take 1 Cap by mouth daily. Dose may be taken before bedtime. 90 Cap 2   ??? valsartan-hydroCHLOROthiazide (DIOVAN-HCT) 80-12.5 mg per tablet Take 1 Tab by mouth daily. 90 Tab 3   ??? atorvastatin (LIPITOR) 40 mg tablet Take 1  Tab by mouth Daily (before breakfast). 90 Tab 3   ??? anastrozole (ARIMIDEX) 1 mg tablet TAKE 1 TABLET ONCE DAILY 90 Tab 3   ??? alendronate (FOSAMAX) 35 mg tablet Take 1 Tab by mouth every seven (7) days. 12 Tab 3   ??? krill-om-3-dha-epa-phospho-ast (MEGARED OMEGA-3 KRILL OIL) 1,000-230-60 mg cap Take 1 Tab by mouth two (2) times a day.     ??? ergocalciferol (ERGOCALCIFEROL) 50,000 unit capsule Take 1 Cap by mouth every seven (7) days. 4 Cap 5   ??? ipratropium (ATROVENT) 0.03 % nasal spray 2 Sprays by Both Nostrils route every twelve (12) hours. (Patient taking differently: 2 Sprays by Both Nostrils route as needed.) 30 mL 5   ??? cholecalciferol (VITAMIN D3) 400 unit tab tablet Take 400 Units by mouth daily.     ??? brimonidine-timolol (COMBIGAN) 0.2-0.5 % drop ophthalmic solution Administer 1 Drop to both eyes every twelve (12) hours.     ??? aspirin 81 mg chewable tablet Take 81 mg by mouth daily.     ??? venlafaxine-SR (EFFEXOR-XR) 75 mg capsule      ??? metFORMIN (GLUCOPHAGE) 500 mg tablet Take 1 Tab by mouth two (2) times  daily (with meals). 60 Tab 3     Allergies: Review of patient's allergies indicates no known allergies.     Tobacco History:  reports that she quit smoking about 19 years ago. She has a 30.00 pack-year smoking history. She has never used smokeless tobacco.  Alcohol Abuse:  reports that she drinks about 7.0 oz of alcohol per week   Drug Abuse:  reports that she does not use illicit drugs.    Family Medical/Cancer History:   Family History   Problem Relation Age of Onset   ??? Cancer Mother 37     Ovarian - Passed away in Jan 07, 1965   ??? Other Son 51     Pancreatitis    ??? Cancer Paternal Aunt 16     Pancreatic        Review of Systems - History obtained from the patient  Constitutional: negative for weight loss, fever, night sweats  HEENT: negative for hearing loss, earache, congestion, snoring, sorethroat  CV: negative for chest pain, palpitations, edema  Resp: negative for cough, shortness of breath, wheezing  GI: negative for change in bowel habits, abdominal pain, black or bloody stools  GU: negative for frequency, dysuria, hematuria, vaginal discharge  MSK: negative for back pain, joint pain, muscle pain  Breast: negative for breast lumps, nipple discharge, galactorrhea  Skin :negative for itching, rash, hives  Neuro: negative for dizziness, headache, confusion, weakness  Psych: negative for anxiety, depression, change in mood  Heme/lymph: negative for bleeding, bruising, pallor    Physical Exam    Visit Vitals   ??? BP 131/66   ??? Pulse 77   ??? Ht '5\' 3"'$  (1.6 m)   ??? Wt 160 lb (72.6 kg)   ??? BMI 28.34 kg/m2       Constitutional  ?? Appearance: well-nourished, well developed, alert, in no acute distress    HENT  ?? Head and Face: appears normal    Neck  ?? Inspection/Palpation: normal appearance, no masses or tenderness  ?? Lymph Nodes: no lymphadenopathy present  ?? Thyroid: gland size normal, nontender, no nodules or masses present on palpation    Chest  ?? Respiratory Effort: breathing unlabored   ?? Auscultation: normal breath sounds    Cardiovascular  ?? Heart:  ?? Auscultation: regular rate and rhythm without  murmur    Breasts  ?? Inspection of Breasts: breasts symmetrical, no skin changes, no discharge present, nipple appearance normal, no skin retraction present  ?? Palpation of Breasts and Axillae: no masses present on palpation, no breast tenderness  ?? Axillary Lymph Nodes: no lymphadenopathy present    Gastrointestinal  ?? Abdominal Examination: abdomen non-tender to palpation, normal bowel sounds, no masses present  ?? Liver and spleen: no hepatomegaly present, spleen not palpable  ?? Hernias: no hernias identified    Genitourinary  ?? External Genitalia: normal appearance for age, no discharge present, no tenderness present, no inflammatory lesions present, no masses present, moderate atrophy present  ?? Vagina: atrophic but otherwise normal vaginal vault without central or paravaginal defects, no discharge present, no inflammatory lesions present, no masses present; atrophic  ?? Bladder: non-tender to palpation  ?? Urethra: appears normal  ?? Cervix: absent   ?? Uterus: absent  ?? Adnexa: no adnexal tenderness present, no adnexal masses present  ?? Perineum: perineum within normal limits, no evidence of trauma, no rashes or skin lesions present  ?? Anus: anus within normal limits, no hemorrhoids present  ?? Inguinal Lymph Nodes: no lymphadenopathy present  ?? RV:  No masses    Skin  ?? General Inspection: no rash, no lesions identified    Neurologic/Psychiatric  ?? Mental Status:  ?? Orientation: grossly oriented to person, place and time  ?? Mood and Affect: mood normal, affect appropriate    Assessment:  ?? Routine gynecologic examination  ?? Vulvovaginal atrophy, dyspareunia  ?? H/o breast cancer.  Followed by Dr. Laurena Bering.  ?? H/o TAH/BSO.  No abnl paps    Plan:  ?? Counseled re: diet, exercise, healthy lifestyle  ?? Return for yearly wellness visits  ?? Reviewed vaginal moisturizers, lubricants.  Adv to use moisturizers  regularly for 2-3 months to see how effective they will be.  ?? OK to exit routine pap smear screening.

## 2016-03-07 NOTE — Patient Instructions (Signed)
Plan to meet with Dr. Marko Plume about chemotherapy.  We will also place a referral to genetics and you will receive a phone call with that appt date and time. Plan for 6 cycles of carboplatin and taxol.  You will follow up with Dr. Denman George after the completion of your chemotherapy.  Carboplatin injection What is this medicine? CARBOPLATIN (KAR boe pla tin) is a chemotherapy drug. It targets fast dividing cells, like cancer cells, and causes these cells to die. This medicine is used to treat ovarian cancer and many other cancers. This medicine may be used for other purposes; ask your health care provider or pharmacist if you have questions. What should I tell my health care provider before I take this medicine? They need to know if you have any of these conditions: -blood disorders -hearing problems -kidney disease -recent or ongoing radiation therapy -an unusual or allergic reaction to carboplatin, cisplatin, other chemotherapy, other medicines, foods, dyes, or preservatives -pregnant or trying to get pregnant -breast-feeding How should I use this medicine? This drug is usually given as an infusion into a vein. It is administered in a hospital or clinic by a specially trained health care professional. Talk to your pediatrician regarding the use of this medicine in children. Special care may be needed. Overdosage: If you think you have taken too much of this medicine contact a poison control center or emergency room at once. NOTE: This medicine is only for you. Do not share this medicine with others. What if I miss a dose? It is important not to miss a dose. Call your doctor or health care professional if you are unable to keep an appointment. What may interact with this medicine? -medicines for seizures -medicines to increase blood counts like filgrastim, pegfilgrastim, sargramostim -some antibiotics like amikacin, gentamicin, neomycin, streptomycin, tobramycin -vaccines Talk to your doctor or  health care professional before taking any of these medicines: -acetaminophen -aspirin -ibuprofen -ketoprofen -naproxen This list may not describe all possible interactions. Give your health care provider a list of all the medicines, herbs, non-prescription drugs, or dietary supplements you use. Also tell them if you smoke, drink alcohol, or use illegal drugs. Some items may interact with your medicine. What should I watch for while using this medicine? Your condition will be monitored carefully while you are receiving this medicine. You will need important blood work done while you are taking this medicine. This drug may make you feel generally unwell. This is not uncommon, as chemotherapy can affect healthy cells as well as cancer cells. Report any side effects. Continue your course of treatment even though you feel ill unless your doctor tells you to stop. In some cases, you may be given additional medicines to help with side effects. Follow all directions for their use. Call your doctor or health care professional for advice if you get a fever, chills or sore throat, or other symptoms of a cold or flu. Do not treat yourself. This drug decreases your body's ability to fight infections. Try to avoid being around people who are sick. This medicine may increase your risk to bruise or bleed. Call your doctor or health care professional if you notice any unusual bleeding. Be careful brushing and flossing your teeth or using a toothpick because you may get an infection or bleed more easily. If you have any dental work done, tell your dentist you are receiving this medicine. Avoid taking products that contain aspirin, acetaminophen, ibuprofen, naproxen, or ketoprofen unless instructed by your doctor. These  medicines may hide a fever. Do not become pregnant while taking this medicine. Women should inform their doctor if they wish to become pregnant or think they might be pregnant. There is a potential for  serious side effects to an unborn child. Talk to your health care professional or pharmacist for more information. Do not breast-feed an infant while taking this medicine. What side effects may I notice from receiving this medicine? Side effects that you should report to your doctor or health care professional as soon as possible: -allergic reactions like skin rash, itching or hives, swelling of the face, lips, or tongue -signs of infection - fever or chills, cough, sore throat, pain or difficulty passing urine -signs of decreased platelets or bleeding - bruising, pinpoint red spots on the skin, black, tarry stools, nosebleeds -signs of decreased red blood cells - unusually weak or tired, fainting spells, lightheadedness -breathing problems -changes in hearing -changes in vision -chest pain -high blood pressure -low blood counts - This drug may decrease the number of white blood cells, red blood cells and platelets. You may be at increased risk for infections and bleeding. -nausea and vomiting -pain, swelling, redness or irritation at the injection site -pain, tingling, numbness in the hands or feet -problems with balance, talking, walking -trouble passing urine or change in the amount of urine Side effects that usually do not require medical attention (report to your doctor or health care professional if they continue or are bothersome): -hair loss -loss of appetite -metallic taste in the mouth or changes in taste This list may not describe all possible side effects. Call your doctor for medical advice about side effects. You may report side effects to FDA at 1-800-FDA-1088. Where should I keep my medicine? This drug is given in a hospital or clinic and will not be stored at home. NOTE: This sheet is a summary. It may not cover all possible information. If you have questions about this medicine, talk to your doctor, pharmacist, or health care provider.    2016, Elsevier/Gold Standard.  (2008-01-08 14:38:05)  Paclitaxel injection What is this medicine? PACLITAXEL (PAK li TAX el) is a chemotherapy drug. It targets fast dividing cells, like cancer cells, and causes these cells to die. This medicine is used to treat ovarian cancer, breast cancer, and other cancers. This medicine may be used for other purposes; ask your health care provider or pharmacist if you have questions. What should I tell my health care provider before I take this medicine? They need to know if you have any of these conditions: -blood disorders -irregular heartbeat -infection (especially a virus infection such as chickenpox, cold sores, or herpes) -liver disease -previous or ongoing radiation therapy -an unusual or allergic reaction to paclitaxel, alcohol, polyoxyethylated castor oil, other chemotherapy agents, other medicines, foods, dyes, or preservatives -pregnant or trying to get pregnant -breast-feeding How should I use this medicine? This drug is given as an infusion into a vein. It is administered in a hospital or clinic by a specially trained health care professional. Talk to your pediatrician regarding the use of this medicine in children. Special care may be needed. Overdosage: If you think you have taken too much of this medicine contact a poison control center or emergency room at once. NOTE: This medicine is only for you. Do not share this medicine with others. What if I miss a dose? It is important not to miss your dose. Call your doctor or health care professional if you are unable to keep  an appointment. What may interact with this medicine? Do not take this medicine with any of the following medications: -disulfiram -metronidazole This medicine may also interact with the following medications: -cyclosporine -diazepam -ketoconazole -medicines to increase blood counts like filgrastim, pegfilgrastim, sargramostim -other chemotherapy drugs like cisplatin, doxorubicin, epirubicin,  etoposide, teniposide, vincristine -quinidine -testosterone -vaccines -verapamil Talk to your doctor or health care professional before taking any of these medicines: -acetaminophen -aspirin -ibuprofen -ketoprofen -naproxen This list may not describe all possible interactions. Give your health care provider a list of all the medicines, herbs, non-prescription drugs, or dietary supplements you use. Also tell them if you smoke, drink alcohol, or use illegal drugs. Some items may interact with your medicine. What should I watch for while using this medicine? Your condition will be monitored carefully while you are receiving this medicine. You will need important blood work done while you are taking this medicine. This drug may make you feel generally unwell. This is not uncommon, as chemotherapy can affect healthy cells as well as cancer cells. Report any side effects. Continue your course of treatment even though you feel ill unless your doctor tells you to stop. This medicine can cause serious allergic reactions. To reduce your risk you will need to take other medicine(s) before treatment with this medicine. In some cases, you may be given additional medicines to help with side effects. Follow all directions for their use. Call your doctor or health care professional for advice if you get a fever, chills or sore throat, or other symptoms of a cold or flu. Do not treat yourself. This drug decreases your body's ability to fight infections. Try to avoid being around people who are sick. This medicine may increase your risk to bruise or bleed. Call your doctor or health care professional if you notice any unusual bleeding. Be careful brushing and flossing your teeth or using a toothpick because you may get an infection or bleed more easily. If you have any dental work done, tell your dentist you are receiving this medicine. Avoid taking products that contain aspirin, acetaminophen, ibuprofen,  naproxen, or ketoprofen unless instructed by your doctor. These medicines may hide a fever. Do not become pregnant while taking this medicine. Women should inform their doctor if they wish to become pregnant or think they might be pregnant. There is a potential for serious side effects to an unborn child. Talk to your health care professional or pharmacist for more information. Do not breast-feed an infant while taking this medicine. Men are advised not to father a child while receiving this medicine. This product may contain alcohol. Ask your pharmacist or healthcare provider if this medicine contains alcohol. Be sure to tell all healthcare providers you are taking this medicine. Certain medicines, like metronidazole and disulfiram, can cause an unpleasant reaction when taken with alcohol. The reaction includes flushing, headache, nausea, vomiting, sweating, and increased thirst. The reaction can last from 30 minutes to several hours. What side effects may I notice from receiving this medicine? Side effects that you should report to your doctor or health care professional as soon as possible: -allergic reactions like skin rash, itching or hives, swelling of the face, lips, or tongue -low blood counts - This drug may decrease the number of white blood cells, red blood cells and platelets. You may be at increased risk for infections and bleeding. -signs of infection - fever or chills, cough, sore throat, pain or difficulty passing urine -signs of decreased platelets or bleeding - bruising,  pinpoint red spots on the skin, black, tarry stools, nosebleeds -signs of decreased red blood cells - unusually weak or tired, fainting spells, lightheadedness -breathing problems -chest pain -high or low blood pressure -mouth sores -nausea and vomiting -pain, swelling, redness or irritation at the injection site -pain, tingling, numbness in the hands or feet -slow or irregular heartbeat -swelling of the ankle,  feet, hands Side effects that usually do not require medical attention (report to your doctor or health care professional if they continue or are bothersome): -bone pain -complete hair loss including hair on your head, underarms, pubic hair, eyebrows, and eyelashes -changes in the color of fingernails -diarrhea -loosening of the fingernails -loss of appetite -muscle or joint pain -red flush to skin -sweating This list may not describe all possible side effects. Call your doctor for medical advice about side effects. You may report side effects to FDA at 1-800-FDA-1088. Where should I keep my medicine? This drug is given in a hospital or clinic and will not be stored at home. NOTE: This sheet is a summary. It may not cover all possible information. If you have questions about this medicine, talk to your doctor, pharmacist, or health care provider.    2016, Elsevier/Gold Standard. (2015-05-21 13:02:56)

## 2016-03-07 NOTE — Progress Notes (Signed)
Gynecologic Oncology Multi-Disciplinary Disposition Conference Note  Date of the Conference: Mar 07, 2016  Patient Name: Jessica Johnston  Referring Provider: Dr. Toney Rakes Primary GYN Oncologist: Dr. 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  Disposition is to 6 cycles of carboplatin and taxol.  Referral to genetics.    This Multidisciplinary conference took place involving physicians from [place], [place], Radiation Oncology, Pathology, Radiology along with the Gynecologic Oncology Nurse Practitioner and RN.  Comprehensive assessment of the patient's malignancy, staging, need for surgery, chemotherapy, radiation therapy, and need for further testing were reviewed. Supportive measures, both inpatient and following discharge were also discussed. The recommended plan of care is documented. Greater than 35 minutes were spent correlating and coordinating this patient's care.

## 2016-03-07 NOTE — Progress Notes (Signed)
Follow-up Note: Gyn-Onc  Consult was initially requested by Dr. Toney Rakes for the evaluation of Jessica Johnston 75 y.o. female  CC:  Chief Complaint  Patient presents with  . high grade serous fallopian tube cancer    follow-up    Assessment/Plan:  [name]  is a 75 y.o.  year old with stage IIIC left fallopian tube cancer, s/p exploratory laparotomy, TAH, BSO, omentectomy, optimal cytoreductive surgery on 02/18/16.   She is doing very well postoperatively.  We had an extensive discussion regarding operative and pathology findings, discussion from [facility], and reviewed her tumor. We discussed that this cancer is treated with 6 cycles of adjuvant taxane and platinum chemotherapy. We discussed the option of IP chemotherapy for optimally cytoreduced ovarian cancer, however we discussed that there is recent conflicting data about the true magnitude of difference/improvement in outcomes compared with modern chemotherapy IV regimens. We discussed the increased toxicity imparted with IP chemotherapy.  I discussed the anticipated treatment course and potential major toxicities and side effects. She has consultation scheduled with DrLivesay.  We discussed the importance of genetic testing in this disease to prognosticate but also to determine treatment options.  This has been scheduled for her.  I will see Ridwan back following completion of chemotherapy. She is cleared post-operatively to commence chemotherapy.  HPI: Jessica Johnston is a 75 year old para's woman who is seen in consultation at the request of Dr. Toney Rakes for abdominal peritoneal carcinomatosis and omental caking. The patient has a known history of a 3 cm left ovarian cyst for which she saw my partner approximately 15 months ago. At that time her CA-125 is mildly elevated to 49 in her Roma 1 score was also elevated. However the left ovarian cyst was unilocular, measured only 3 cm, and was long-standing, and therefore suspicion for occult  malignancy was low.  In November 2016 she began feeling central and upper abdominal discomfort. Initially she thought it was part of the grieving process she lost her grandson. However when the pain and discomfort persisted she sought evaluation by her providers. A gynecologic evaluation was unremarkable. However eventually she underwent CT scan imaging on 01/27/2016 for this persistent midabdominal pain. CT imaging showed a liver containing a few small low-density lesions which were equivocal for the potential of metastatic disease these were subcentimeter in dimension. There was an extensive omental nodularity which was highly worrisome for peritoneal carcinomatosis. A 3.5 cm stable left adnexal simple cyst was identified. This was not typical for ovarian cancer. There were no other obvious signs of metastatic disease including no gross ascites.  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 She is not required lymphadenectomy for any of these melanoma lesions. She is no family history significant for breast or ovarian cancer.   Interval Hx:  On 02/18/16 she underwent an exploratory laparotomy, TAH, BSO, omentectomy, radical tumor debulking and argon beam coagulation of milliary tumor implants. Disease was predominantly in the omentum with milliary studding of the ovaries and peritoneal surfaces. There was a right ovarian cyst measuring 3cm. There was no gross residual disease at completion of surgery with exception of the residual nodules that had received argon beam coagulation.  Postoperatively she did well with no complications.  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 The right ovarian cyst (that had been present in 2015) was benign.   She is doing well since  discharge from hospital, eating well, no pain, no vaginal bleeding.  Current Meds:  Outpatient Encounter Prescriptions as of  03/07/2016  Medication Sig  . acetaminophen (TYLENOL) 500 MG tablet Take 1,000 mg by mouth every 6 (six) hours as needed (For pain.).  Marland Kitchen aspirin 81 MG tablet Take 81 mg by mouth daily.  . Cholecalciferol (VITAMIN D3) 2000 units TABS Take 2,000 Units by mouth daily.  Marland Kitchen enoxaparin (LOVENOX) 40 MG/0.4ML injection Inject 0.4 mLs (40 mg total) into the skin daily.  Marland Kitchen ezetimibe (ZETIA) 10 MG tablet Take 10 mg by mouth daily.   . Pitavastatin Calcium (LIVALO) 2 MG TABS Take 2 mg by mouth daily.   . [DISCONTINUED] oxyCODONE (OXY IR/ROXICODONE) 5 MG immediate release tablet Take 1 tablet (5 mg total) by mouth every 4 (four) hours as needed for severe pain or breakthrough pain.   No facility-administered encounter medications on file as of 03/07/2016.    Allergy: No Known Allergies  Social Hx:   Social History   Social History  . Marital Status: Married    Spouse Name: N/A  . Number of Children: N/A  . Years of Education: N/A   Occupational History  . Not on file.   Social History Main Topics  . Smoking status: Never Smoker   . Smokeless tobacco: Never Used  . Alcohol Use: 0.0 oz/week    0 Standard drinks or equivalent per week     Comment: wine 1 daily  . Drug Use: No  . Sexual Activity: No   Other Topics Concern  . Not on file   Social History Narrative    Past Surgical Hx:  Past Surgical History  Procedure Laterality Date  . Tonsillectomy and adenoidectomy  1948  . Hysteroscopy    . Omental mass      Biopsy, CT guided  . Laparotomy N/A 02/18/2016    Procedure: EXPLORATORY LAPAROTOMY;  Surgeon: Everitt Amber, MD;  Location: [facility];  Service: Gynecology;  Laterality: N/A;  . Omentectomy N/A 02/18/2016    Procedure: OMENTECTOMY;  Surgeon: Everitt Amber, MD;  Location: [facility];  Service: Gynecology;  Laterality: N/A;  . Debulking N/A 02/18/2016    Procedure: RADICAL TUMOR DEBULKING;  Surgeon: Everitt Amber, MD;  Location: [facility];  Service: Gynecology;  Laterality: N/A;  . Abdominal  hysterectomy N/A 02/18/2016    Procedure: HYSTERECTOMY ABDOMINAL;  Surgeon: Everitt Amber, MD;  Location: [facility];  Service: Gynecology;  Laterality: N/A;  . Salpingoophorectomy Bilateral 02/18/2016    Procedure: BILATERAL SALPINGO OOPHORECTOMY;  Surgeon: Everitt Amber, MD;  Location: [facility];  Service: Gynecology;  Laterality: Bilateral;    Past Medical Hx:  Past Medical History  Diagnosis Date  . High cholesterol   . Headache     past hx.-no in awhile.  . Arthritis     arthritis -hands-knees.  . Cancer ([place])     MELANOMA. multiple x5 different sites. last 10 yrs ago.    Past Gynecological History:  SVD x 2  No LMP recorded. Patient is postmenopausal.  Family Hx: History reviewed. No pertinent family history.  Review of Systems:  Constitutional  Feels well,    ENT Normal appearing ears and nares bilaterally Skin/Breast  No rash, sores, jaundice, itching, dryness Cardiovascular  No chest pain, shortness of breath, or edema  Pulmonary  No cough or wheeze.  Gastro Intestinal  No abdominal pain or difficulty eating Genito Urinary  No frequency, urgency, dysuria, no bleeding Musculo Skeletal  No myalgia, arthralgia, joint swelling or  pain  Neurologic  No weakness, numbness, change in gait,  Psychology  No depression, anxiety, insomnia.   Vitals:  Blood pressure 109/50, pulse 70, temperature 98.1 F (36.7 C), temperature source Oral, resp. rate 18, height 5\' 4"  (1.626 m), weight 145 lb 9.6 oz (66.044 kg), SpO2 100 %.  Physical Exam: WD in NAD Neck  Supple NROM, without any enlargements.  Lymph Node Survey No cervical supraclavicular or inguinal adenopathy Cardiovascular  Pulse normal rate, regularity and rhythm. S1 and S2 normal.  Lungs  Clear to auscultation bilateraly, without wheezes/crackles/rhonchi. Good air movement.  Skin  No rash/lesions/breakdown  Psychiatry  Alert and oriented to person, place, and time  Abdomen  Normoactive bowel sounds, abdomen soft,  non-tender and mildly overweight (BMI 26) without evidence of hernia. Well healed incision with no signs of infection. Back No CVA tenderness Genito Urinary  Vulva/vagina: Normal external female genitalia.  No lesions. No discharge or bleeding.  Bladder/urethra:  No lesions or masses, well supported bladder  Vagina: well healing cuff  Cervix: surgically absent  Uterus:  Surgically absent  Adnexa: no palpable masses. Rectal  deferred.  Extremities  No bilateral cyanosis, clubbing or edema.   30 minutes of direct face to face counseling time was spent with the patient. This included discussion about prognosis, therapy recommendations and postoperative side effects and are beyond the scope of routine postoperative care.  Donaciano Eva, MD  03/07/2016, 5:50 PM

## 2016-03-08 DIAGNOSIS — F4322 Adjustment disorder with anxiety: Secondary | ICD-10-CM | POA: Diagnosis not present

## 2016-03-09 ENCOUNTER — Ambulatory Visit (HOSPITAL_BASED_OUTPATIENT_CLINIC_OR_DEPARTMENT_OTHER): Payer: Medicare Other

## 2016-03-09 ENCOUNTER — Encounter: Payer: Self-pay | Admitting: *Deleted

## 2016-03-09 ENCOUNTER — Other Ambulatory Visit: Payer: Medicare Other

## 2016-03-09 DIAGNOSIS — C5700 Malignant neoplasm of unspecified fallopian tube: Secondary | ICD-10-CM | POA: Diagnosis not present

## 2016-03-09 LAB — CBC WITH DIFFERENTIAL/PLATELET
BASO%: 0.5 % (ref 0.0–2.0)
BASOS ABS: 0 10*3/uL (ref 0.0–0.1)
EOS ABS: 0.4 10*3/uL (ref 0.0–0.5)
EOS%: 10.5 % — AB (ref 0.0–7.0)
HCT: 37.9 % (ref 34.8–46.6)
HEMOGLOBIN: 12.1 g/dL (ref 11.6–15.9)
LYMPH%: 27.7 % (ref 14.0–49.7)
MCH: 28.7 pg (ref 25.1–34.0)
MCHC: 31.9 g/dL (ref 31.5–36.0)
MCV: 90 fL (ref 79.5–101.0)
MONO#: 0.2 10*3/uL (ref 0.1–0.9)
MONO%: 5.6 % (ref 0.0–14.0)
NEUT#: 2.3 10*3/uL (ref 1.5–6.5)
NEUT%: 55.7 % (ref 38.4–76.8)
Platelets: 333 10*3/uL (ref 145–400)
RBC: 4.21 10*6/uL (ref 3.70–5.45)
RDW: 13.1 % (ref 11.2–14.5)
WBC: 4.1 10*3/uL (ref 3.9–10.3)
lymph#: 1.1 10*3/uL (ref 0.9–3.3)

## 2016-03-09 LAB — COMPREHENSIVE METABOLIC PANEL
ALBUMIN: 3.4 g/dL — AB (ref 3.5–5.0)
ALK PHOS: 85 U/L (ref 40–150)
ALT: 34 U/L (ref 0–55)
AST: 19 U/L (ref 5–34)
Anion Gap: 9 mEq/L (ref 3–11)
BILIRUBIN TOTAL: 0.37 mg/dL (ref 0.20–1.20)
BUN: 12.4 mg/dL (ref 7.0–26.0)
CHLORIDE: 107 meq/L (ref 98–109)
CO2: 25 mEq/L (ref 22–29)
CREATININE: 0.7 mg/dL (ref 0.6–1.1)
Calcium: 9.5 mg/dL (ref 8.4–10.4)
EGFR: 84 mL/min/{1.73_m2} — AB (ref >90)
GLUCOSE: 104 mg/dL (ref 70–140)
POTASSIUM: 4.5 meq/L (ref 3.5–5.1)
SODIUM: 142 meq/L (ref 136–145)
Total Protein: 6.7 g/dL (ref 6.4–8.3)

## 2016-03-10 ENCOUNTER — Other Ambulatory Visit: Payer: Self-pay | Admitting: Oncology

## 2016-03-10 LAB — CA 125: Cancer Antigen (CA) 125: 154.4 U/mL — ABNORMAL HIGH (ref 0.0–38.1)

## 2016-03-11 ENCOUNTER — Telehealth: Payer: Self-pay | Admitting: Oncology

## 2016-03-11 ENCOUNTER — Other Ambulatory Visit: Payer: Self-pay | Admitting: General Surgery

## 2016-03-11 ENCOUNTER — Encounter: Payer: Self-pay | Admitting: Oncology

## 2016-03-11 ENCOUNTER — Ambulatory Visit (HOSPITAL_BASED_OUTPATIENT_CLINIC_OR_DEPARTMENT_OTHER): Payer: Medicare Other | Admitting: Oncology

## 2016-03-11 ENCOUNTER — Telehealth: Payer: Self-pay

## 2016-03-11 VITALS — BP 113/64 | HR 69 | Temp 97.9°F | Resp 18 | Wt 143.5 lb

## 2016-03-11 DIAGNOSIS — E7800 Pure hypercholesterolemia, unspecified: Secondary | ICD-10-CM | POA: Diagnosis not present

## 2016-03-11 DIAGNOSIS — C5702 Malignant neoplasm of left fallopian tube: Secondary | ICD-10-CM

## 2016-03-11 DIAGNOSIS — Z85820 Personal history of malignant melanoma of skin: Secondary | ICD-10-CM | POA: Diagnosis not present

## 2016-03-11 DIAGNOSIS — K5730 Diverticulosis of large intestine without perforation or abscess without bleeding: Secondary | ICD-10-CM

## 2016-03-11 DIAGNOSIS — C439 Malignant melanoma of skin, unspecified: Secondary | ICD-10-CM | POA: Insufficient documentation

## 2016-03-11 DIAGNOSIS — K5790 Diverticulosis of intestine, part unspecified, without perforation or abscess without bleeding: Secondary | ICD-10-CM | POA: Diagnosis not present

## 2016-03-11 DIAGNOSIS — Z8669 Personal history of other diseases of the nervous system and sense organs: Secondary | ICD-10-CM | POA: Insufficient documentation

## 2016-03-11 DIAGNOSIS — I878 Other specified disorders of veins: Secondary | ICD-10-CM | POA: Diagnosis not present

## 2016-03-11 MED ORDER — ONDANSETRON HCL 8 MG PO TABS: 8.0000 mg | ORAL_TABLET | Freq: Three times a day (TID) | ORAL | Status: DC | PRN

## 2016-03-11 MED ORDER — LORAZEPAM 0.5 MG PO TABS: 0.5000 mg | ORAL_TABLET | Freq: Four times a day (QID) | ORAL | Status: DC | PRN

## 2016-03-11 MED ORDER — LIDOCAINE-PRILOCAINE 2.5-2.5 % EX CREA: 1.0000 "application " | TOPICAL_CREAM | CUTANEOUS | Status: DC | PRN

## 2016-03-11 MED ORDER — DEXAMETHASONE 4 MG PO TABS: ORAL_TABLET | ORAL | Status: DC

## 2016-03-11 NOTE — Progress Notes (Signed)
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   Patient had history of benign right ovarian cyst and uterine fibroids,  for which she saw Dr Josephina Shih in 08-2014, CA 125 slightly elevated then which was felt due to fibroids. She developed mild RUQ discomfort 08-2015, which she noticed mostly when taking a deep breath. She had CT AP by Dr Joylene Draft 01-27-16 which showed carcinomatosis, with right adnexal lesion 2.8 x 2.7 x 3.5 cm without solid component, unremarkable left ovary, no ascites, no abdominal or pelvic adenopathy. CA 125 was 656 on 02-08-16. She had consultation with Dr Denman George on 02-08-16, then CT guided core needle biopsy of omental mass on 02-15-16, with pathology ([id number]) papillary serous neoplasm which appeared low grade. CXR 02-16-16 small amount of right pleural thickening or fluid and linear atelectasis or scarring left lung base.She had exploratory laparotomy with TAH, BSO, omentectomy and radical debulking by Dr Denman George on 02-18-16, intraoperative findings remarkable for 10 cm omental cake, milial studding 1 mm pelvic surfaces, right colic gutter, right diaphragm and liver, mesentery of transverse colon and rectum, 3 cm cystic mass in right ovary. Procedure included argon beam coagulation of miliary tumor implants, with no residual disease apparent at completion. Surgical pathology [id number]) high grade serous carcinoma of left  fallopian tube involving omentum, with benign serous right ovarian cyst 3.5 cm. Post operatively she has slow recovery of bowel function, but was stable for DC on 02-22-16.  Case was presented at gyn oncology multidisciplinary conference on 03-07-16, with recommendation for 6 cycles of adjuvant carboplatin taxol. Patient was seen in follow up by Dr Denman George on 03-07-16 and attended chemotherapy education class on 03-09-16.     REVIEW OF SYSTEMS Intentional weight loss thru [place] and exercise program Dec and Jan, and further weight loss with recent surgery. Goal weight 135 - 140. No fever or recent infectious illness. Constipation since surgery, better now with miralax and colace. Appetite ok, no N/V. No bladder symptoms. No bleeding, on daily prophylactic lovenox since surgery. No abdominal or pelvic pain. Surgical incision healing well. No migranes in >2 yrs. Good visual acuity with glasses. No environmental allergies. Hearing good. No dental problems, up to date on dental exams ([place]). No thyroid symptoms. No SOB, cough, other respiratory symptoms. No cardiac symptoms. No changes in breasts, up to date mammograms. Some arthritis hands and knees. No LE swelling. No peripheral neuropathy. No migraines in hospital with zofran used per EMR Remainder of full 10 point review of systems negative.   ALLERGIES: NKDA  PAST MEDICAL/ SURGICAL HISTORY:    G2 Melanomas x 5: all superficial with exception of "stage 1" left shoulder, otherwise midback, left forearm, right calf. No node dissections, no other treatment. Last 2 removed in [place], [place]. Last 10 years ago. Dermatology exams q 6 months. Tonsillectomy Hysteroscopy ([place]) Elevated cholesterol Colonoscopy 07-2007 (D.[place]) diverticulosis Mammograms 11-12-2015 [place] negative Difficult peripheral venous access in hospital Echocardiogram 02-2012 LVEF 60-65% and  some MR  CURRENT MEDICATIONS: reviewed as listed now in EMR. Per EMR,  received zofran with surgery. Prescriptions for zofran, ativan, decadron, EMLA   SOCIAL HISTORY:  From [place], in [place] since 1962. Married x 53 years. 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 No tobacco. Sons ages 59 and 41`, 3 grands. One grandson committed suicide.  She enjoys painting and gardening Is neighbor to Dr Dorris Carnes  FAMILY HISTORY:  Mother died in childbirth when patient was 2 Father lived to 51 Sister nonmalignant colon surgery, no melanomas No cancer in paternal relatives; no information re mother's family 1 son with endocarditis age 48 following gum surgery, treated at [facility], porcine valve "Depression runs in family"         PHYSICAL EXAM:  weight is 143 lb 8 oz (65.091 kg). Her oral temperature is 97.9 F (36.6 C). Her blood pressure is 113/64 and her pulse is 69. Her respiration is 18 and oxygen saturation is 100%.  Alert, pleasant, cooperative lady looks stated age, easily mobile, appears comfortable. Respirations not labored RA. Good historian. Husband very supportive.  HEENT:normal hair pattern. PERRL,  Not icteric. Oral mucosa moist and clear. Neck supple without JVD or thyroid mass  RESPIRATORY: lungs clear to A and P  CARDIAC/ VASCULAR: peripheral pulses intact and symmetrical. Heart RRR without murmur or gallop. Peripheral venous access in bilateral UE does not appear adequate for chemo.  ABDOMEN: soft, not tender including epigastrium. Midline incision appears to be healing well, surgical glue still intact, very minimal erythema just at incision in region of umbilicus, no tenderness or drainage. Abdomen not distended. A few BS. No HSM or mass  LYMPH NODES: no cervical, supraclavicular, axillary or inguinal adenopathy  BREASTS: bilaterally without dominant mass, skin or nipple findings  NEUROLOGIC: CN, motor, sensory, cerebellar nonfocal. No peripheral neuropathy PSYCH: tearful when mentions grandchild's  suicide. Appropriate mood and affect.   SKIN: areas of melanoma excisions with well healed, barely visible scars mid back, left shoulder anteriorly, left forearm, right mid calf, and no evidence of local recurrence. No other skin lesions noted, not full skin exam.  MUSCULOSKELETAL: symmetrical muscle mass. Mild changes of degenerative arthritis hands bilaterally. Back not tender. No LE swelling, cords, tenderness    LABORATORY DATA:  Results for orders placed or performed in visit on 03/09/16 (from the past 48 hour(s))  CBC with Differential     Status: Abnormal   Collection Time: 03/09/16  9:35 AM  Result Value Ref Range   WBC 4.1 3.9 - 10.3 10e3/uL   NEUT# 2.3 1.5 - 6.5 10e3/uL   HGB 12.1 11.6 - 15.9 g/dL   HCT 37.9 34.8 - 46.6 %   Platelets 333 145 - 400 10e3/uL   MCV 90.0 79.5 - 101.0 fL   MCH 28.7 25.1 - 34.0 pg   MCHC 31.9 31.5 - 36.0 g/dL   RBC 4.21 3.70 - 5.45 10e6/uL   RDW 13.1 11.2 - 14.5 %   lymph# 1.1 0.9 - 3.3 10e3/uL   MONO# 0.2 0.1 - 0.9 10e3/uL   Eosinophils Absolute 0.4 0.0 - 0.5 10e3/uL   Basophils Absolute 0.0 0.0 - 0.1 10e3/uL   NEUT% 55.7 38.4 - 76.8 %   LYMPH% 27.7 14.0 - 49.7 %   MONO% 5.6 0.0 - 14.0 %   EOS% 10.5 (H) 0.0 - 7.0 %   BASO% 0.5 0.0 - 2.0 %  Comprehensive metabolic panel     Status: Abnormal   Collection Time:  03/09/16  9:35 AM  Result Value Ref Range   Sodium 142 136 - 145 mEq/L   Potassium 4.5 3.5 - 5.1 mEq/L   Chloride 107 98 - 109 mEq/L   CO2 25 22 - 29 mEq/L   Glucose 104 70 - 140 mg/dl    Comment: Glucose reference range is for nonfasting patients. Fasting glucose reference range is 70- 100.   BUN 12.4 7.0 - 26.0 mg/dL   Creatinine 0.7 0.6 - 1.1 mg/dL   Total Bilirubin 0.37 0.20 - 1.20 mg/dL   Alkaline Phosphatase 85 40 - 150 U/L   AST 19 5 - 34 U/L   ALT 34 0 - 55 U/L   Total Protein 6.7 6.4 - 8.3 g/dL   Albumin 3.4 (L) 3.5 - 5.0 g/dL   Calcium 9.5 8.4 - 10.4 mg/dL   Anion Gap 9 3 - 11 mEq/L   EGFR 84 (L) >90 ml/min/1.73 m2     Comment: eGFR is calculated using the CKD-EPI Creatinine Equation (2009)  CA 125     Status: Abnormal   Collection Time: 03/09/16  9:35 AM  Result Value Ref Range   Cancer Antigen (CA) 125 154.4 (H) 0.0 - 38.1 U/mL    Comment: Roche ECLIA methodology     Labs above reviewed at visit. CA 125 likely reflects recent surgery.   PATHOLOGY: Addendum FINAL for LUCILE, HILLMANN ([id number]) Patient: TITILAYO, HAGANS Collected: 02/18/2016 Client: [facility] Accession: [id number] Received: 02/18/2016 Everitt Amber, MD REPORT OF SURGICAL PATHOLOGY REASON FOR ADDENDUM, AMENDMENT OR CORRECTION: [id number]: Addendum to add oncology table added for part 2. 02/29/16 12:26:55 PM (gt) ADDITIONAL INFORMATION: 2. The right ovary cyst is a benign serous cyst measuring 3.5 cm. No malignant features are identified. (JBK:ds 03/07/16) Enid Cutter MD Pathologist, Electronic Signature ( Signed 03/07/2016) FINAL DIAGNOSIS Diagnosis 1. Omentum, resection for tumor INVASIVE IMPLANT OF HIGH GRADE SEROUS CARCINOMA 2. Uterus +/- tubes/ovaries, neoplastic HIGH GRADE SEROUS CARCINOMA INVOLVING LEFT TUBAL FIMBRIA SEROUS CARCINOMA WITH PREDOMINANT PSAMMOMA BODIES IMPLANT AT UTERUS SEROSA, BILATERAL FALLOPIAN TUBAL SEROSA, AND ANTERIOR PERITONEAL REFLECTION CERVIX: HISTOLOGICAL UNREMARKABLE ENDOMETRIUM: INACTIVE ENDOMETRIUM MYOMETRIUM: LEIOMYOMA LEFT OVARY: CYSTADENOFIBROMA RIGHT OVARY AND FALLOPIAN TUBE: HISTOLOGICAL UNREMARKABLE Microscopic Comment 2. ONCOLOGY TABLE - FALLOPIAN TUBE 1. Specimen, including laterality: Omentum, uterus, bilateral ovaries and fallopian tubes 2. Procedure: Hysterectomy, bilateral salpingo-oophorectomy and tumor debulking omentectomy 3. Lymph node sampling performed: No 4. Tumor site: uterus serosa, bilateral fallopian tubal serosa, peritoneal and omentum 5. Tumor location in fallopian tube: Left fallopian tubal fimbria 6. Specimen integrity  (intact/ruptured/disrupted): Intact 7. Tumor size (cm): multi focal invasive omentum implant greater than 2 cm, left fallopian tube tumor 0.8 cm. 8. Histologic type: Serous carcinoma 9. Grade: 3 10. Microscopic tumor extension: uterus serosa, bilateral fallopian tubal serosa, peritoneal and omentum 11. Margins: NA 12. Lymph-Vascular invasion: identified 13. Lymph nodes: # examined: 0; # positive: NA 14. TNM: pT3c, pNx 15. FIGO Stage (based on pathologic findings, needs clinical correlation: IIIC 16. Comment: High grade serous carcinoma multifocally and extensively involves left fallopian tubal fimbria, omentum, uterus serosa, bilateral fallopian tubal serosa and peritoneal. At the left fallopian tube there are small foci of serous tubal intraepithelial carcinoma identified, so we conclude the carcinoma is fallopian tube origin.     FINAL for MAECYN, PANNING ([id number]) Patient: DALILAH, CURLIN Collected: 02/15/2016 Client: [facility] Accession: [id number] Received: 02/15/2016 Aletta Edouard REPORT OF SURGICAL PATHOLOGFINAL DIAGNOSIS Diagnosis Omentum, biopsy, left - PAPILLARY SEROUS NEOPLASM, SEE COMMENT. Microscopic Comment There  are papillary collections of largely low grade appearing cells with numerous psammoma bodies. Given the limited material it is difficult to distinguish between invasive implants of a serous borderline tumor and serous carcinoma, especially given the low grade appearance.    RADIOGRAPHY: Result     CLINICAL DATA: Intermittent mid abdominal pain for 4 months. History of ovarian cyst. Remote history of melanoma.  EXAM: CT ABDOMEN AND PELVIS WITH CONTRAST  TECHNIQUE: Multidetector CT imaging of the abdomen and pelvis was performed using the standard protocol following bolus administration of intravenous contrast.  CONTRAST: 137m ISOVUE-300 IOPAMIDOL (ISOVUE-300) INJECTION 61%  COMPARISON: None.  FINDINGS: Lower chest: Mild  linear scarring or atelectasis at both lung bases. No suspicious nodularity, pleural or pericardial effusion.  Hepatobiliary: The liver demonstrates a few small low-density lesions, most notably on images 22, 30 and 36. No highly worrisome hepatic findings are seen. No evidence of gallstones, gallbladder wall thickening or biliary dilatation.  Pancreas: Mildly atrophied without focal abnormality. No surrounding inflammation or ductal dilatation.  Spleen: Normal in size without focal abnormality.  Adrenals/Urinary Tract: The is a punctate calcification and within the left adrenal gland. The right adrenal gland appears normal. No evidence of adrenal mass. There are no suspicious renal findings. There is no urinary tract calculus or hydronephrosis. There is an extrarenal pelvis on the right. There are left renal parapelvic cysts. The bladder appears unremarkable.  Stomach/Bowel: No evidence of bowel wall thickening, distention or surrounding inflammatory change. There are mild diverticular changes throughout the colon. The appendix appears normal.  Vascular/Lymphatic: There are no enlarged abdominal or pelvic lymph nodes. Mild aortic and branch vessel atherosclerosis.  Reproductive: Low-density right adnexal lesion measures 2.8 x 2.7 x 3.5 cm. This demonstrates no solid components. The left ovary appears normal. There are linear, somewhat nodular calcifications along the anterior margin of the uterus, likely related to the peritoneal disease described below. No focal uterine mass or solid adnexal mass identified.  Other: There is extensive nodularity throughout the omentum, highly worrisome for peritoneal carcinomatosis. There is a small amount of ascites, predominantly perihepatic. No dominant peritoneal mass or suspicious subcutaneous nodularity identified.  Musculoskeletal: No acute or significant osseous findings.  IMPRESSION: 1. Extensive omental nodularity  highly worrisome for peritoneal carcinomatosis. Late recurrence of melanoma can present as peritoneal carcinomatosis. Ovarian cancer more commonly presents in this manner. Patient does have a predominately low density right adnexal lesion measuring up to 3.5 cm, although this lesion is not typical for ovarian cancer. 2. No evidence of bowel or ureteral obstruction. 3. No other definite signs of metastatic disease. There are small indeterminate low-density hepatic lesions.       DISCUSSION: All of history above reviewed in detail with patient and husband, including circumstances surrounding diagnosis.  I have specifically told them of benign pathology findings of right ovary. We have discussed CA 125 marker.  We have discussed optimal debulking at surgery, which is certainly in her favor, and fact that she is recovering well from the surgery.  We have discussed role of adjuvant chemotherapy. They are comfortable with information given in the chemotherapy education class and have been given opportunity to ask questions. We have reviewed every 3 week chemo schedule, which seems preferable to weekly dosing for her, and possible side effects of the chemotherapy including cytopenias, nausea and hair loss. We have discussed antiemetics, premedication steroids,  follow up during treatment, importance of good nutrition, activity. Peripheral IV access does not appear adequate for chemotherapy, including by RN  evaluation now. Patient is familiar with PAC (which son had during endocarditis treatment) and in agreement with having this placed by IR. We have discussed genetics counseling, both for the fallopian carcinoma and for >3 melanomas. That referral will be made a little later, as she has multiple appointments just now with start of chemotherapy.   I have spoken with IR directly, no availability in next week at [facility] but likely can be placed at [facility] on either 5-31 or 03-17-16, those schedulers to contact  patient directly. She understands that she should hold lovenox day prior to [facility] placement.  She has been given written instructions for medications also.   Patient understands discussion and has given verbal consent for the chemotherapy. First available treatment time is 03-18-16 due to holiday on 04-14-16.     IMPRESSION / PLAN:  1. 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 Follows every 6 months with dermatologist Dr Linus Galas. 3.inadequate peripheral venous access: IR to place power PAC for chemo, anticipated at [facility] on either 5-30 or 03-17-16. Hold prophylactic lovenox day prior.  4.benign right ovarian cyst: evaluated 08-2014 at which time there was no evidence of malignancy, and confirmed by pathology from this surgery 5.elevated cholesterol followed by Dr Joylene Draft 6.diverticulosis by colonoscopy, no apparent symptoms 7.degenerative arthritis hands and knees, mild 8.history of migraine HAs, not exacerbated by zofran used in hospital 9.social stress with recent death of grandchild by suicide 47.advance directives in place   Patient and husband have had questions answered to their satisfaction and are in agreement with plan above. They can contact this office for questions or concerns at any time prior to next scheduled visit. Chemo orders placed and managed care notified;  [place] also ordered for preauthorization in case needed. RN to speak with her by phone prior to first treatment to review medications and possible taxol aches.  Route to Dr Joylene Draft, cc Drs Denman George , Toney Rakes and [facility] Time spent 75  min , including >50% discussion and coordination of care.    Aldrin Engelhard P, MD 03/11/2016 8:48 AM

## 2016-03-11 NOTE — Telephone Encounter (Signed)
-----   Message from Gordy Levan, MD sent at 03/11/2016 10:33 AM EDT ----- 1.Prescriptions as follows to pharmacy please:  Zofran (ondansetron)  8 mg   One every 8 hrs as needed for nausea. Nondrowsy  #30   1 RF  Ativan (lorazepam)  0.5 mg    One po or SL q 6 hrs prn nausea.  Will make drowsy.  #20   NRF  Take night of chemo whether or not any nausea  Decadron (dexamethasone, steroid)  4 mg    Five tablets (=20 mg) with food 12 hrs and 6 hrs before chemo.  #10 for first cycle  EMLA  One large tube  1 RF  2. RN please call her before first chemo [phone] June 2 to review meds and instructions: As first chemo will be ~ 9:00 on Fri June 2, you need to eat a little and take five tablets at ~ 9 PM on Thurs June 1, then eat a little and take five tablets at ~ 3:00 AM on Fri June 2. Take ativan night of chemo and take zofran AM after chemo, whether or not any nausea.  3. She will have PAC by IR either 5-30 or 6-1. She has been told not to take lovenox  the day before portacath is placed,  can start back on the lovenox after port is put in as long as radiologist does not tell her otherwise  thanks

## 2016-03-11 NOTE — Telephone Encounter (Signed)
lvm that 4 prescriptions sent to pharmacy. Instructions on bottles but we will call a few days before chemo to go over the prescriptions with her.

## 2016-03-11 NOTE — Telephone Encounter (Signed)
appt made and avs printed °

## 2016-03-11 NOTE — Patient Instructions (Addendum)
We will send prescriptions to your pharmacy. Dr Mariana Kaufman nurse will also call you before first chemo to review these.  Zofran (ondansetron)  8 mg   One every 8 hrs as needed for nausea. Will not make you drowsy. You should take one dose the AM after chemo whether or not any nausea.  Ativan (lorazepam)  0.5 mg    One swallow or dissolve under tongue every 6 hrs as needed for nausea. This will make you drowsy.  Take night of chemo whether or not any nausea  Decadron (dexamethasone, steroid)  4 mg    Five tablets (=20 mg) with food 12 hrs and 6 hrs before chemo. As first chemo will be ~ 9:00 on Fri June 2, you need to eat a little and take five tablets at ~ 9 PM on Thurs June 1, then eat a little and take five tablets at ~ 3:00 AM on Fri June 2.  EMLA numbing cream for portacath   Do not take lovenox shot the day before portacath is placed. You can start back on the lovenox after port is put in as long as radiologist does not tell you otherwise   You can call any time if questions or concerns   [phone]

## 2016-03-15 ENCOUNTER — Ambulatory Visit (HOSPITAL_COMMUNITY)
Admission: RE | Admit: 2016-03-15 | Discharge: 2016-03-15 | Disposition: A | Discharge status: Home / Self Care | Payer: Medicare Other | Source: Ambulatory Visit | Attending: Oncology | Admitting: Oncology

## 2016-03-15 ENCOUNTER — Other Ambulatory Visit: Payer: Self-pay | Admitting: Oncology

## 2016-03-15 ENCOUNTER — Encounter (HOSPITAL_COMMUNITY): Payer: Self-pay

## 2016-03-15 DIAGNOSIS — C5702 Malignant neoplasm of left fallopian tube: Secondary | ICD-10-CM | POA: Insufficient documentation

## 2016-03-15 DIAGNOSIS — Z90722 Acquired absence of ovaries, bilateral: Secondary | ICD-10-CM | POA: Insufficient documentation

## 2016-03-15 DIAGNOSIS — Z7901 Long term (current) use of anticoagulants: Secondary | ICD-10-CM | POA: Insufficient documentation

## 2016-03-15 DIAGNOSIS — Z79899 Other long term (current) drug therapy: Secondary | ICD-10-CM | POA: Insufficient documentation

## 2016-03-15 DIAGNOSIS — Z85820 Personal history of malignant melanoma of skin: Secondary | ICD-10-CM | POA: Diagnosis not present

## 2016-03-15 DIAGNOSIS — E7800 Pure hypercholesterolemia, unspecified: Secondary | ICD-10-CM | POA: Insufficient documentation

## 2016-03-15 DIAGNOSIS — Z90710 Acquired absence of both cervix and uterus: Secondary | ICD-10-CM | POA: Insufficient documentation

## 2016-03-15 DIAGNOSIS — Z7982 Long term (current) use of aspirin: Secondary | ICD-10-CM | POA: Diagnosis not present

## 2016-03-15 DIAGNOSIS — I878 Other specified disorders of veins: Secondary | ICD-10-CM

## 2016-03-15 DIAGNOSIS — Z9079 Acquired absence of other genital organ(s): Secondary | ICD-10-CM | POA: Insufficient documentation

## 2016-03-15 DIAGNOSIS — Z5111 Encounter for antineoplastic chemotherapy: Secondary | ICD-10-CM | POA: Diagnosis not present

## 2016-03-15 LAB — CBC
HCT: 36 % (ref 36.0–46.0)
Hemoglobin: 11.6 g/dL — ABNORMAL LOW (ref 12.0–15.0)
MCH: 28.8 pg (ref 26.0–34.0)
MCHC: 32.2 g/dL (ref 30.0–36.0)
MCV: 89.3 fL (ref 78.0–100.0)
PLATELETS: 239 10*3/uL (ref 150–400)
RBC: 4.03 MIL/uL (ref 3.87–5.11)
RDW: 13 % (ref 11.5–15.5)
WBC: 4 10*3/uL (ref 4.0–10.5)

## 2016-03-15 LAB — APTT: aPTT: 30 seconds (ref 24–37)

## 2016-03-15 LAB — PROTIME-INR
INR: 1.14 (ref 0.00–1.49)
PROTHROMBIN TIME: 14.8 s (ref 11.6–15.2)

## 2016-03-15 MED ORDER — MIDAZOLAM HCL 2 MG/2ML IJ SOLN
INTRAMUSCULAR | Status: AC
  Filled 2016-03-15: qty 2

## 2016-03-15 MED ORDER — MIDAZOLAM HCL 2 MG/2ML IJ SOLN
INTRAMUSCULAR | Status: AC | PRN
  Administered 2016-03-15: 1 mg via INTRAVENOUS

## 2016-03-15 MED ORDER — CEFAZOLIN SODIUM-DEXTROSE 2-4 GM/100ML-% IV SOLN
INTRAVENOUS | Status: AC
  Filled 2016-03-15: qty 100

## 2016-03-15 MED ORDER — FENTANYL CITRATE (PF) 100 MCG/2ML IJ SOLN
INTRAMUSCULAR | Status: AC
  Filled 2016-03-15: qty 2

## 2016-03-15 MED ORDER — SODIUM CHLORIDE 0.9 % IV SOLN
INTRAVENOUS | Status: DC
  Administered 2016-03-15: 08:00:00 via INTRAVENOUS

## 2016-03-15 MED ORDER — LIDOCAINE-EPINEPHRINE (PF) 1 %-1:200000 IJ SOLN
INTRAMUSCULAR | Status: AC
  Administered 2016-03-15: 30 mL
  Filled 2016-03-15: qty 30

## 2016-03-15 MED ORDER — HEPARIN SOD (PORK) LOCK FLUSH 100 UNIT/ML IV SOLN
INTRAVENOUS | Status: AC | PRN
  Administered 2016-03-15: 500 [IU] via INTRAVENOUS

## 2016-03-15 MED ORDER — HEPARIN SOD (PORK) LOCK FLUSH 100 UNIT/ML IV SOLN
INTRAVENOUS | Status: AC
  Filled 2016-03-15: qty 5

## 2016-03-15 MED ORDER — CEFAZOLIN SODIUM-DEXTROSE 2-4 GM/100ML-% IV SOLN
2.0000 g | INTRAVENOUS | Status: AC
  Administered 2016-03-15: 2 g via INTRAVENOUS

## 2016-03-15 MED ORDER — FENTANYL CITRATE (PF) 100 MCG/2ML IJ SOLN
INTRAMUSCULAR | Status: AC | PRN
  Administered 2016-03-15: 50 ug via INTRAVENOUS

## 2016-03-15 MED ORDER — HEPARIN SOD (PORK) LOCK FLUSH 100 UNIT/ML IV SOLN
INTRAVENOUS | Status: AC
  Administered 2016-03-15: 500 [IU]
  Filled 2016-03-15: qty 5

## 2016-03-15 NOTE — Procedures (Signed)
R IJ Port cathter placement with US and fluoroscopy No complication No blood loss. See complete dictation in [facility] PACS.  

## 2016-03-15 NOTE — Sedation Documentation (Signed)
Patient is resting comfortably. 

## 2016-03-15 NOTE — Sedation Documentation (Signed)
Patient is resting comfortably. No complaints at this time, vitals stable. 

## 2016-03-15 NOTE — Sedation Documentation (Signed)
Vital signs stable. 

## 2016-03-15 NOTE — H&P (Signed)
Chief Complaint: Patient was seen in consultation today for Port a Cath placement at the request of Livesay,Lennis P  Referring Physician(s): Livesay,Lennis P  Supervising Physician: Arne Cleveland  Patient Status: Out-pt  History of Present Illness: Jessica Johnston is a 75 y.o. female   Newly diagnosed serous carcinoma Left fallopian tube  Bx omental mass 5/1:  Diagnosis Omentum, biopsy, left - PAPILLARY SEROUS NEOPLASM  Surgery 02/18/2016 She had exploratory laparotomy with TAH, BSO, omentectomy and radical debulking by Dr Denman George on 02-18-16, intraoperative findings remarkable for 10 cm omental cake, milial studding 1 mm pelvic surfaces, right colic gutter, right diaphragm and liver, mesentery of transverse colon and rectum, 3 cm cystic mass in right ovary.  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  Now scheduled for [facility] placement to begin treatment per Dr Marko Plume Last dose Lovenox 5/28  4pm    Past Medical History  Diagnosis Date  . High cholesterol   . Headache     past hx.-no in awhile.  . Arthritis     arthritis -hands-knees.  . Cancer ([place])     MELANOMA. multiple x5 different sites. last 10 yrs ago.    Past Surgical History  Procedure Laterality Date  . Tonsillectomy and adenoidectomy  1948  . Hysteroscopy    . Omental mass      Biopsy, CT guided  . Laparotomy N/A 02/18/2016    Procedure: EXPLORATORY LAPAROTOMY;  Surgeon: Everitt Amber, MD;  Location: [facility];  Service: Gynecology;  Laterality: N/A;  . Omentectomy N/A 02/18/2016    Procedure: OMENTECTOMY;  Surgeon: Everitt Amber, MD;  Location: [facility];  Service: Gynecology;  Laterality: N/A;  . Debulking N/A 02/18/2016    Procedure: RADICAL TUMOR DEBULKING;  Surgeon: Everitt Amber, MD;  Location: [facility];  Service: Gynecology;  Laterality: N/A;  . Abdominal hysterectomy N/A 02/18/2016    Procedure: HYSTERECTOMY  ABDOMINAL;  Surgeon: Everitt Amber, MD;  Location: [facility];  Service: Gynecology;  Laterality: N/A;  . Salpingoophorectomy Bilateral 02/18/2016    Procedure: BILATERAL SALPINGO OOPHORECTOMY;  Surgeon: Everitt Amber, MD;  Location: [facility];  Service: Gynecology;  Laterality: Bilateral;    Allergies: Review of patient's allergies indicates no known allergies.  Medications: Prior to Admission medications   Medication Sig Start Date End Date Taking? Authorizing Provider  acetaminophen (TYLENOL) 500 MG tablet Take 1,000 mg by mouth every 6 (six) hours as needed (For pain.).   Yes Historical Provider, MD  Cholecalciferol (VITAMIN D3) 2000 units TABS Take 2,000 Units by mouth at bedtime.    Yes Historical Provider, MD  docusate sodium (COLACE) 100 MG capsule Take 100 mg by mouth 2 (two) times daily.   Yes Historical Provider, MD  ezetimibe (ZETIA) 10 MG tablet Take 10 mg by mouth daily.    Yes Historical Provider, MD  ibuprofen (ADVIL,MOTRIN) 200 MG tablet Take 400 mg by mouth every 6 (six) hours as needed.   Yes Historical Provider, MD  lovastatin (MEVACOR) 10 MG tablet Take 10 mg by mouth at bedtime.   Yes Historical Provider, MD  polyethylene glycol (MIRALAX / GLYCOLAX) packet Take 17 g by mouth daily as needed.   Yes Historical Provider, MD  aspirin 81 MG tablet Take 81 mg by mouth daily.    Historical Provider, MD  dexamethasone (DECADRON) 4 MG tablet Take 5 tablets (20mg ) with food 12 hours and 6 hours before chemo. 03/11/16  Lennis Marion Downer, MD  enoxaparin (LOVENOX) 40 MG/0.4ML injection Inject 0.4 mLs (40 mg total) into the skin daily. 02/20/16   Lahoma Crocker, MD  lidocaine-prilocaine (EMLA) cream Apply 1 application topically as needed. Place on port 1 hour before needle sticks, cover with plastic wrap. 03/11/16   Lennis Marion Downer, MD  LORazepam (ATIVAN) 0.5 MG tablet Take 1 tablet (0.5 mg total) by mouth every 6 (six) hours as needed (nausea). May take sublingual. Will make drowsy 03/11/16   Lennis  Marion Downer, MD  ondansetron (ZOFRAN) 8 MG tablet Take 1 tablet (8 mg total) by mouth every 8 (eight) hours as needed for nausea or vomiting. Will not make drowsy 03/11/16   Gordy Levan, MD     History reviewed. No pertinent family history.  Social History   Social History  . Marital Status: Married    Spouse Name: N/A  . Number of Children: N/A  . Years of Education: N/A   Social History Main Topics  . Smoking status: Never Smoker   . Smokeless tobacco: Never Used  . Alcohol Use: 0.0 oz/week    0 Standard drinks or equivalent per week     Comment: wine 1 daily  . Drug Use: No  . Sexual Activity: No   Other Topics Concern  . None   Social History Narrative     Review of Systems: A 12 point ROS discussed and pertinent positives are indicated in the HPI above.  All other systems are negative.  Review of Systems  Constitutional: Positive for fatigue. Negative for fever, activity change and appetite change.  Respiratory: Negative for shortness of breath.   Gastrointestinal: Positive for abdominal pain.  Neurological: Negative for weakness.  Psychiatric/Behavioral: Negative for behavioral problems and confusion.    Vital Signs: BP 113/62 mmHg  Pulse 70  Temp(Src) 97.5 F (36.4 C)  Resp 20  Ht 5\' 4"  (1.626 m)  Wt 145 lb (65.772 kg)  BMI 24.88 kg/m2  SpO2 100%  Physical Exam  Constitutional: She is oriented to person, place, and time.  Cardiovascular: Normal rate, regular rhythm and normal heart sounds.   Pulmonary/Chest: Effort normal and breath sounds normal. She has no wheezes.  Abdominal: Soft. Bowel sounds are normal.  Musculoskeletal: Normal range of motion.  Neurological: She is alert and oriented to person, place, and time.  Skin: Skin is warm and dry.  Psychiatric: She has a normal mood and affect. Her behavior is normal. Judgment and thought content normal.  Nursing note and vitals reviewed.   Mallampati Score:  MD Evaluation Airway: WNL Heart:  WNL Abdomen: WNL Chest/ Lungs: WNL ASA  Classification: 3 Mallampati/Airway Score: One  Imaging: Dg Chest 2 View  02/16/2016  CLINICAL DATA:  Omental mass. EXAM: CHEST  2 VIEW COMPARISON:  None. FINDINGS: Normal sized heart. Small amount of linear density at the left lung base. Small amount of pleural thickening or fluid at the right lung base. Diffuse osteopenia. IMPRESSION: 1. Small amount of linear atelectasis or scarring at the left lung base. 2. Small amount of right pleural thickening or fluid. Electronically Signed   By: Claudie Revering M.D.   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 The patient presents for CT-guided biopsy of the omentum. EXAM: CT GUIDED CORE BIOPSY OF OMENTAL MASS ANESTHESIA/SEDATION: 1.0  Mg IV Versed; 50 mcg IV Fentanyl Total Moderate Sedation Time: 10 minutes. The patient's  level of consciousness and physiologic status were continuously monitored during the procedure by Radiology nursing. PROCEDURE: The procedure risks, benefits, and alternatives were explained to the patient. Questions regarding the procedure were encouraged and answered. The patient understands and consents to the procedure. A time-out was performed prior to initiating the procedure. The anterior abdominal wall was prepped with chlorhexidine in a sterile fashion, and a sterile drape was applied covering the operative field. A sterile gown and sterile gloves were used for the procedure. Local anesthesia was provided with 1% Lidocaine. Localizing CT of the lower abdomen and upper pelvis was performed in a supine position without contrast. A 17 gauge trocar needle was advanced under CT guidance into the anterior peritoneal cavity. Coaxial 18 gauge core biopsy samples were obtained. Four biopsy samples were submitted in formalin. Additional CT was performed after biopsy. COMPLICATIONS: None FINDINGS: Diffuse nodularity  of the anterior omentum is noted just deep to the abdominal wall. Biopsy samples were obtained just to the left of midline. Solid tissue was obtained. IMPRESSION: CT-guided core biopsy performed of omental mass just deep to the abdominal wall. Electronically Signed   By: Aletta Edouard M.D.   On: 02/15/2016 13:29    Labs:  CBC:  Recent Labs  02/19/16 0428 02/19/16 1629 02/20/16 0440 02/21/16 0936 03/09/16 0935  WBC 9.0  --  7.1 5.9 4.1  HGB 11.7* 12.1 10.7* 10.5* 12.1  HCT 35.4* 37.7 32.9* 32.4* 37.9  PLT 287  --  228 224 333    COAGS:  Recent Labs  02/15/16 0955  INR 1.03    BMP:  Recent Labs  02/16/16 1115 02/19/16 0428 02/21/16 0936 03/09/16 0935  NA 140 139 138 142  K 4.6 4.3 4.1 4.5  CL 107 106 105  --   CO2 26 25 24 25   GLUCOSE 136* 154* 104* 104  BUN 12 12 9  12.4  CALCIUM 8.9 8.5* 8.5* 9.5  CREATININE 0.59 0.48 0.46 0.7  GFRNONAA >60 >60 >60  --   GFRAA >60 >60 >60  --     LIVER FUNCTION TESTS:  Recent Labs  02/08/16 1159 02/16/16 1115 03/09/16 0935  BILITOT <0.30 0.2* 0.37  AST 33 19 19  ALT 55 29 34  ALKPHOS 97 79 85  PROT 7.0 6.6 6.7  ALBUMIN 3.6 3.5 3.4*    TUMOR MARKERS: No results for input(s): AFPTM, CEA, CA199, CHROMGRNA in the last 8760 hours.  Assessment and Plan:  Dx serous carcinoma Left fallopian tube 02/15/2016 Now for [facility] and cancer treatment per Dr Marko Plume Risks and Benefits discussed with the patient including, but not limited to bleeding, infection, pneumothorax, or fibrin sheath development and need for additional procedures. All of the patient's questions were answered, patient is agreeable to proceed. Consent signed and in chart.  Thank you for this interesting consult.  I greatly enjoyed meeting Jessica Johnston and look forward to participating in their care.  A copy of this report was sent to the requesting provider on this date.  Electronically Signed: Dhani Imel A 03/15/2016, 8:10 AM   I spent a total of   30 Minutes   in face to face in clinical consultation, greater than 50% of which was counseling/coordinating care for [facility] placement

## 2016-03-15 NOTE — Discharge Instructions (Signed)

## 2016-03-16 ENCOUNTER — Telehealth: Payer: Self-pay

## 2016-03-16 ENCOUNTER — Encounter: Payer: Self-pay | Admitting: Oncology

## 2016-03-16 ENCOUNTER — Encounter: Payer: Self-pay | Admitting: Genetic Counselor

## 2016-03-16 NOTE — Progress Notes (Signed)
Pt returned my call, discussed her having 2 insurances so copay assistance may not be needed but I will meet her on 03/18/16 to give her my contact info in case anything changes and I need to apply for copay assistance in her behalf.  I also asked if she needed assistance w/ personal bills or transportation while going thru treatment, she declined.

## 2016-03-16 NOTE — Telephone Encounter (Signed)
Reviewed medication instructions as noted below by Dr. Marko Plume.  Teach Back Method utilized. Reviewed application of EMLA cream prior to access. Discussed Taxol aches and possible interventions to minimize the aches post treatment as noted below by Dr. Marko Plume. Jessica Johnston encouraged to call prior to treatment 6-2 if she has any questions or concerns.

## 2016-03-16 NOTE — Telephone Encounter (Signed)
Prescriptions as follows to pharmacy please:   Zofran (ondansetron) 8 mg  One every 8 hrs as needed for nausea. Nondrowsy #30  1 RF   Ativan (lorazepam) 0.5 mg  One po or SL q 6 hrs prn nausea. Will make drowsy. #20  NRF   Take night of chemo whether or not any nausea   Decadron (dexamethasone, steroid) 4 mg  Five tablets (=20 mg) with food 12 hrs and 6 hrs before chemo. #10 for first cycle   EMLA One large tube 1 RF   2. RN please call her before first chemo [phone] June 2 to review meds and instructions:  As first chemo will be ~ 9:00 on Fri June 2, you need to eat a little and take five tablets at ~ 9 PM on Thurs June 1, then eat a little and take five tablets at ~ 3:00 AM on Fri June 2.  Take ativan night of chemo and take zofran AM after chemo, whether or not any nausea.   3. She will have PAC by IR either 5-30 or 6-1. She has been told not to take lovenox the day before portacath is placed, can start back on the lovenox after port is put in as long as radiologist does not tell her otherwise   thanks

## 2016-03-16 NOTE — Progress Notes (Signed)
Left vm for pt to return my call to discuss financial assistance.  °

## 2016-03-16 NOTE — Telephone Encounter (Signed)
-----   Message from Gordy Levan, MD sent at 03/11/2016  7:52 PM EDT ----- #2 message on this new patient  Prior to first chemo and with follow up phone call after chemo, RN please also remind her of possible taxol aches, as I do not think this was emphasized in our discussion:  Tell her these may start a day to a couple of days after chemo and may last a few days before resolving completely each time. Sometimes the aches are minimal and sometimes they are more like generalized aching, which can include legs, low back, sternum, etc.  Please suggest she take claritin 10 mg daily beginning day of chemo x 4-5 days, as this may improve aches.  Tylenol or aleve/ ibuprofen may help. Heating pad or hot shower may help.  If she has pain medication left at home from surgery, she can try those, tho may make her more constipated and often pain meds do not help significantly with taxol aches.   thanks

## 2016-03-17 ENCOUNTER — Other Ambulatory Visit: Payer: Self-pay

## 2016-03-17 ENCOUNTER — Ambulatory Visit: Payer: Medicare Other | Admitting: Oncology

## 2016-03-17 DIAGNOSIS — C5700 Malignant neoplasm of unspecified fallopian tube: Secondary | ICD-10-CM

## 2016-03-18 ENCOUNTER — Ambulatory Visit (HOSPITAL_BASED_OUTPATIENT_CLINIC_OR_DEPARTMENT_OTHER): Payer: Medicare Other

## 2016-03-18 ENCOUNTER — Ambulatory Visit: Payer: Medicare Other

## 2016-03-18 ENCOUNTER — Other Ambulatory Visit (HOSPITAL_BASED_OUTPATIENT_CLINIC_OR_DEPARTMENT_OTHER): Payer: Medicare Other

## 2016-03-18 VITALS — BP 120/66 | HR 86 | Temp 98.8°F | Resp 17

## 2016-03-18 DIAGNOSIS — Z95828 Presence of other vascular implants and grafts: Secondary | ICD-10-CM

## 2016-03-18 DIAGNOSIS — C5702 Malignant neoplasm of left fallopian tube: Secondary | ICD-10-CM | POA: Diagnosis not present

## 2016-03-18 DIAGNOSIS — C5700 Malignant neoplasm of unspecified fallopian tube: Secondary | ICD-10-CM

## 2016-03-18 DIAGNOSIS — Z5111 Encounter for antineoplastic chemotherapy: Secondary | ICD-10-CM | POA: Diagnosis not present

## 2016-03-18 LAB — COMPREHENSIVE METABOLIC PANEL
ALBUMIN: 3.7 g/dL (ref 3.5–5.0)
ALK PHOS: 106 U/L (ref 40–150)
ALT: 99 U/L — ABNORMAL HIGH (ref 0–55)
ANION GAP: 10 meq/L (ref 3–11)
AST: 38 U/L — ABNORMAL HIGH (ref 5–34)
BILIRUBIN TOTAL: 0.32 mg/dL (ref 0.20–1.20)
BUN: 14.2 mg/dL (ref 7.0–26.0)
CALCIUM: 9.6 mg/dL (ref 8.4–10.4)
CO2: 21 mEq/L — ABNORMAL LOW (ref 22–29)
Chloride: 106 mEq/L (ref 98–109)
Creatinine: 0.7 mg/dL (ref 0.6–1.1)
EGFR: 84 mL/min/{1.73_m2} — AB (ref >90)
GLUCOSE: 192 mg/dL — AB (ref 70–140)
POTASSIUM: 4 meq/L (ref 3.5–5.1)
SODIUM: 138 meq/L (ref 136–145)
TOTAL PROTEIN: 7.2 g/dL (ref 6.4–8.3)

## 2016-03-18 LAB — CBC WITH DIFFERENTIAL/PLATELET
BASO%: 0.4 % (ref 0.0–2.0)
BASOS ABS: 0 10*3/uL (ref 0.0–0.1)
EOS ABS: 0 10*3/uL (ref 0.0–0.5)
EOS%: 0.1 % (ref 0.0–7.0)
HEMATOCRIT: 37 % (ref 34.8–46.6)
HGB: 12.2 g/dL (ref 11.6–15.9)
LYMPH%: 12.8 % — ABNORMAL LOW (ref 14.0–49.7)
MCH: 28.7 pg (ref 25.1–34.0)
MCHC: 32.9 g/dL (ref 31.5–36.0)
MCV: 87.2 fL (ref 79.5–101.0)
MONO#: 0 10*3/uL — AB (ref 0.1–0.9)
MONO%: 0.7 % (ref 0.0–14.0)
NEUT%: 86 % — ABNORMAL HIGH (ref 38.4–76.8)
NEUTROS ABS: 3.5 10*3/uL (ref 1.5–6.5)
PLATELETS: 227 10*3/uL (ref 145–400)
RBC: 4.25 10*6/uL (ref 3.70–5.45)
RDW: 13.6 % (ref 11.2–14.5)
WBC: 4.1 10*3/uL (ref 3.9–10.3)
lymph#: 0.5 10*3/uL — ABNORMAL LOW (ref 0.9–3.3)

## 2016-03-18 MED ORDER — FAMOTIDINE IN NACL 20-0.9 MG/50ML-% IV SOLN
INTRAVENOUS | Status: AC
Start: 2016-03-18 — End: 2016-03-18
  Filled 2016-03-18: qty 50

## 2016-03-18 MED ORDER — LORAZEPAM 1 MG PO TABS
ORAL_TABLET | ORAL | Status: AC
  Filled 2016-03-18: qty 1

## 2016-03-18 MED ORDER — SODIUM CHLORIDE 0.9 % IJ SOLN
10.0000 mL | INTRAMUSCULAR | Status: DC | PRN
  Administered 2016-03-18: 10 mL via INTRAVENOUS
  Filled 2016-03-18: qty 10

## 2016-03-18 MED ORDER — SODIUM CHLORIDE 0.9 % IV SOLN
Freq: Once | INTRAVENOUS | Status: AC
  Administered 2016-03-18: 10:00:00 via INTRAVENOUS

## 2016-03-18 MED ORDER — DIPHENHYDRAMINE HCL 50 MG/ML IJ SOLN
25.0000 mg | Freq: Once | INTRAMUSCULAR | Status: AC
  Administered 2016-03-18: 25 mg via INTRAVENOUS

## 2016-03-18 MED ORDER — HEPARIN SOD (PORK) LOCK FLUSH 100 UNIT/ML IV SOLN
500.0000 [IU] | Freq: Once | INTRAVENOUS | Status: AC | PRN
Start: 2016-03-18 — End: 2016-03-18
  Administered 2016-03-18: 500 [IU]
  Filled 2016-03-18: qty 5

## 2016-03-18 MED ORDER — SODIUM CHLORIDE 0.9% FLUSH
10.0000 mL | INTRAVENOUS | Status: DC | PRN
  Administered 2016-03-18: 10 mL
  Filled 2016-03-18: qty 10

## 2016-03-18 MED ORDER — SODIUM CHLORIDE 0.9 % IV SOLN
Freq: Once | INTRAVENOUS | Status: AC
  Administered 2016-03-18: 10:00:00 via INTRAVENOUS
  Filled 2016-03-18: qty 4

## 2016-03-18 MED ORDER — SODIUM CHLORIDE 0.9 % IV SOLN
20.0000 mg | Freq: Once | INTRAVENOUS | Status: AC
  Administered 2016-03-18: 20 mg via INTRAVENOUS
  Filled 2016-03-18: qty 2

## 2016-03-18 MED ORDER — LORAZEPAM 1 MG PO TABS
0.5000 mg | ORAL_TABLET | Freq: Once | ORAL | Status: AC | PRN
  Administered 2016-03-18: 0.5 mg via ORAL

## 2016-03-18 MED ORDER — FAMOTIDINE IN NACL 20-0.9 MG/50ML-% IV SOLN
20.0000 mg | Freq: Once | INTRAVENOUS | Status: AC
  Administered 2016-03-18: 20 mg via INTRAVENOUS

## 2016-03-18 MED ORDER — SODIUM CHLORIDE 0.9 % IV SOLN
175.0000 mg/m2 | Freq: Once | INTRAVENOUS | Status: AC
  Administered 2016-03-18: 300 mg via INTRAVENOUS
  Filled 2016-03-18: qty 50

## 2016-03-18 MED ORDER — DIPHENHYDRAMINE HCL 50 MG/ML IJ SOLN
INTRAMUSCULAR | Status: AC
  Filled 2016-03-18: qty 1

## 2016-03-18 MED ORDER — SODIUM CHLORIDE 0.9 % IV SOLN
378.5000 mg | Freq: Once | INTRAVENOUS | Status: AC
  Administered 2016-03-18: 380 mg via INTRAVENOUS
  Filled 2016-03-18: qty 38

## 2016-03-18 NOTE — Patient Instructions (Signed)
[facility] Discharge Instructions for Patients Receiving Chemotherapy  Today you received the following chemotherapy agents:  Carboplatin, Taxol  To help prevent nausea and vomiting after your treatment, we encourage you to take your nausea medication  As prescribed.   If you develop nausea and vomiting that is not controlled by your nausea medication, call the clinic.   BELOW ARE SYMPTOMS THAT SHOULD BE REPORTED IMMEDIATELY:  *FEVER GREATER THAN 100.5 F  *CHILLS WITH OR WITHOUT FEVER  NAUSEA AND VOMITING THAT IS NOT CONTROLLED WITH YOUR NAUSEA MEDICATION  *UNUSUAL SHORTNESS OF BREATH  *UNUSUAL BRUISING OR BLEEDING  TENDERNESS IN MOUTH AND THROAT WITH OR WITHOUT PRESENCE OF ULCERS  *URINARY PROBLEMS  *BOWEL PROBLEMS  UNUSUAL RASH Items with * indicate a potential emergency and should be followed up as soon as possible.  Feel free to call the clinic you have any questions or concerns. The clinic phone number is [phone].  Please show the [facility] at check-in to the Emergency Department and triage nurse.  Paclitaxel injection What is this medicine? PACLITAXEL (PAK li TAX el) is a chemotherapy drug. It targets fast dividing cells, like cancer cells, and causes these cells to die. This medicine is used to treat ovarian cancer, breast cancer, and other cancers. This medicine may be used for other purposes; ask your health care provider or pharmacist if you have questions. What should I tell my health care provider before I take this medicine? They need to know if you have any of these conditions: -blood disorders -irregular heartbeat -infection (especially a virus infection such as chickenpox, cold sores, or herpes) -liver disease -previous or ongoing radiation therapy -an unusual or allergic reaction to paclitaxel, alcohol, polyoxyethylated castor oil, other chemotherapy agents, other medicines, foods, dyes, or preservatives -pregnant or trying  to get pregnant -breast-feeding How should I use this medicine? This drug is given as an infusion into a vein. It is administered in a hospital or clinic by a specially trained health care professional. Talk to your pediatrician regarding the use of this medicine in children. Special care may be needed. Overdosage: If you think you have taken too much of this medicine contact a poison control center or emergency room at once. NOTE: This medicine is only for you. Do not share this medicine with others. What if I miss a dose? It is important not to miss your dose. Call your doctor or health care professional if you are unable to keep an appointment. What may interact with this medicine? Do not take this medicine with any of the following medications: -disulfiram -metronidazole This medicine may also interact with the following medications: -cyclosporine -diazepam -ketoconazole -medicines to increase blood counts like filgrastim, pegfilgrastim, sargramostim -other chemotherapy drugs like cisplatin, doxorubicin, epirubicin, etoposide, teniposide, vincristine -quinidine -testosterone -vaccines -verapamil Talk to your doctor or health care professional before taking any of these medicines: -acetaminophen -aspirin -ibuprofen -ketoprofen -naproxen This list may not describe all possible interactions. Give your health care provider a list of all the medicines, herbs, non-prescription drugs, or dietary supplements you use. Also tell them if you smoke, drink alcohol, or use illegal drugs. Some items may interact with your medicine. What should I watch for while using this medicine? Your condition will be monitored carefully while you are receiving this medicine. You will need important blood work done while you are taking this medicine. This drug may make you feel generally unwell. This is not uncommon, as chemotherapy can affect healthy cells as  healthy cells as well as cancer cells. Report any side  effects. Continue your course of treatment even though you feel ill unless your doctor tells you to stop. This medicine can cause serious allergic reactions. To reduce your risk you will need to take other medicine(s) before treatment with this medicine. In some cases, you may be given additional medicines to help with side effects. Follow all directions for their use. Call your doctor or health care professional for advice if you get a fever, chills or sore throat, or other symptoms of a cold or flu. Do not treat yourself. This drug decreases your body's ability to fight infections. Try to avoid being around people who are sick. This medicine may increase your risk to bruise or bleed. Call your doctor or health care professional if you notice any unusual bleeding. Be careful brushing and flossing your teeth or using a toothpick because you may get an infection or bleed more easily. If you have any dental work done, tell your dentist you are receiving this medicine. Avoid taking products that contain aspirin, acetaminophen, ibuprofen, naproxen, or ketoprofen unless instructed by your doctor. These medicines may hide a fever. Do not become pregnant while taking this medicine. Women should inform their doctor if they wish to become pregnant or think they might be pregnant. There is a potential for serious side effects to an unborn child. Talk to your health care professional or pharmacist for more information. Do not breast-feed an infant while taking this medicine. Men are advised not to father a child while receiving this medicine. This product may contain alcohol. Ask your pharmacist or healthcare provider if this medicine contains alcohol. Be sure to tell all healthcare providers you are taking this medicine. Certain medicines, like metronidazole and disulfiram, can cause an unpleasant reaction when taken with alcohol. The reaction includes flushing, headache, nausea, vomiting, sweating, and increased  thirst. The reaction can last from 30 minutes to several hours. What side effects may I notice from receiving this medicine? Side effects that you should report to your doctor or health care professional as soon as possible: -allergic reactions like skin rash, itching or hives, swelling of the face, lips, or tongue -low blood counts - This drug may decrease the number of white blood cells, red blood cells and platelets. You may be at increased risk for infections and bleeding. -signs of infection - fever or chills, cough, sore throat, pain or difficulty passing urine -signs of decreased platelets or bleeding - bruising, pinpoint red spots on the skin, black, tarry stools, nosebleeds -signs of decreased red blood cells - unusually weak or tired, fainting spells, lightheadedness -breathing problems -chest pain -high or low blood pressure -mouth sores -nausea and vomiting -pain, swelling, redness or irritation at the injection site -pain, tingling, numbness in the hands or feet -slow or irregular heartbeat -swelling of the ankle, feet, hands Side effects that usually do not require medical attention (report to your doctor or health care professional if they continue or are bothersome): -bone pain -complete hair loss including hair on your head, underarms, pubic hair, eyebrows, and eyelashes -changes in the color of fingernails -diarrhea -loosening of the fingernails -loss of appetite -muscle or joint pain -red flush to skin -sweating This list may not describe all possible side effects. Call your doctor for medical advice about side effects. You may report side effects to FDA at 1-800-FDA-1088. Where should I keep my medicine? This drug is given in a hospital or clinic and will   at home. NOTE: This sheet is a summary. It may not cover all possible information. If you have questions about this medicine, talk to your doctor, pharmacist, or health care provider.    2016, Elsevier/Gold  Standard. (2015-05-21 13:02:56) Carboplatin injection What is this medicine? CARBOPLATIN (KAR boe pla tin) is a chemotherapy drug. It targets fast dividing cells, like cancer cells, and causes these cells to die. This medicine is used to treat ovarian cancer and many other cancers. This medicine may be used for other purposes; ask your health care provider or pharmacist if you have questions. What should I tell my health care provider before I take this medicine? They need to know if you have any of these conditions: -blood disorders -hearing problems -kidney disease -recent or ongoing radiation therapy -an unusual or allergic reaction to carboplatin, cisplatin, other chemotherapy, other medicines, foods, dyes, or preservatives -pregnant or trying to get pregnant -breast-feeding How should I use this medicine? This drug is usually given as an infusion into a vein. It is administered in a hospital or clinic by a specially trained health care professional. Talk to your pediatrician regarding the use of this medicine in children. Special care may be needed. Overdosage: If you think you have taken too much of this medicine contact a poison control center or emergency room at once. NOTE: This medicine is only for you. Do not share this medicine with others. What if I miss a dose? It is important not to miss a dose. Call your doctor or health care professional if you are unable to keep an appointment. What may interact with this medicine? -medicines for seizures -medicines to increase blood counts like filgrastim, pegfilgrastim, sargramostim -some antibiotics like amikacin, gentamicin, neomycin, streptomycin, tobramycin -vaccines Talk to your doctor or health care professional before taking any of these medicines: -acetaminophen -aspirin -ibuprofen -ketoprofen -naproxen This list may not describe all possible interactions. Give your health care provider a list of all the medicines, herbs,  non-prescription drugs, or dietary supplements you use. Also tell them if you smoke, drink alcohol, or use illegal drugs. Some items may interact with your medicine. What should I watch for while using this medicine? Your condition will be monitored carefully while you are receiving this medicine. You will need important blood work done while you are taking this medicine. This drug may make you feel generally unwell. This is not uncommon, as chemotherapy can affect healthy cells as well as cancer cells. Report any side effects. Continue your course of treatment even though you feel ill unless your doctor tells you to stop. In some cases, you may be given additional medicines to help with side effects. Follow all directions for their use. Call your doctor or health care professional for advice if you get a fever, chills or sore throat, or other symptoms of a cold or flu. Do not treat yourself. This drug decreases your body's ability to fight infections. Try to avoid being around people who are sick. This medicine may increase your risk to bruise or bleed. Call your doctor or health care professional if you notice any unusual bleeding. Be careful brushing and flossing your teeth or using a toothpick because you may get an infection or bleed more easily. If you have any dental work done, tell your dentist you are receiving this medicine. Avoid taking products that contain aspirin, acetaminophen, ibuprofen, naproxen, or ketoprofen unless instructed by your doctor. These medicines may hide a fever. Do not become pregnant while taking this medicine.  medicine. Women should inform their doctor if they wish to become pregnant or think they might be pregnant. There is a potential for serious side effects to an unborn child. Talk to your health care professional or pharmacist for more information. Do not breast-feed an infant while taking this medicine. What side effects may I notice from receiving this  medicine? Side effects that you should report to your doctor or health care professional as soon as possible: -allergic reactions like skin rash, itching or hives, swelling of the face, lips, or tongue -signs of infection - fever or chills, cough, sore throat, pain or difficulty passing urine -signs of decreased platelets or bleeding - bruising, pinpoint red spots on the skin, black, tarry stools, nosebleeds -signs of decreased red blood cells - unusually weak or tired, fainting spells, lightheadedness -breathing problems -changes in hearing -changes in vision -chest pain -high blood pressure -low blood counts - This drug may decrease the number of white blood cells, red blood cells and platelets. You may be at increased risk for infections and bleeding. -nausea and vomiting -pain, swelling, redness or irritation at the injection site -pain, tingling, numbness in the hands or feet -problems with balance, talking, walking -trouble passing urine or change in the amount of urine Side effects that usually do not require medical attention (report to your doctor or health care professional if they continue or are bothersome): -hair loss -loss of appetite -metallic taste in the mouth or changes in taste This list may not describe all possible side effects. Call your doctor for medical advice about side effects. You may report side effects to FDA at 1-800-FDA-1088. Where should I keep my medicine? This drug is given in a hospital or clinic and will not be stored at home. NOTE: This sheet is a summary. It may not cover all possible information. If you have questions about this medicine, talk to your doctor, pharmacist, or health care provider.    2016, Elsevier/Gold Standard. (2008-01-08 14:38:05)    

## 2016-03-18 NOTE — Patient Instructions (Signed)

## 2016-03-18 NOTE — Progress Notes (Signed)
OK to treat today with ALT 99 per Dr Marko Plume

## 2016-03-19 ENCOUNTER — Encounter

## 2016-03-20 ENCOUNTER — Other Ambulatory Visit: Payer: Self-pay | Admitting: Oncology

## 2016-03-20 DIAGNOSIS — C5702 Malignant neoplasm of left fallopian tube: Secondary | ICD-10-CM

## 2016-03-21 ENCOUNTER — Telehealth: Payer: Self-pay

## 2016-03-21 MED ORDER — VITAMIN D2 1,250 MCG (50,000 UNIT) CAPSULE
1250 mcg (50,000 unit) | ORAL_CAPSULE | ORAL | 4 refills | Status: DC
Start: 2016-03-21 — End: 2016-03-29

## 2016-03-21 NOTE — Telephone Encounter (Signed)
Pt had called at 0913 saying she had a question about nausea medication after her chemo on Friday. This RN LVM for pt to call back and also noted that Barbaraann Share had called about chemo f/u.

## 2016-03-21 NOTE — Telephone Encounter (Signed)
Jessica Johnston is doing well after chemotherapy.  Bowels are moving daily.  Taking in 64 oz of fluid daily. Eating well thus far. She experienced aches in legs and knees for a couple of days but it was not bad. She did not know if she needed to use to nausea meds regularly as she did the first 24 hours.  Told her if she only if she experienced nausea or vomiting.   She has not had nausea and thought she did not need to take medication regularly and has not done so. Jessica Johnston knows to call [phone] if she has any questions or concerns.  She will keep appointment Thursday 03-24-16 as scheduled.

## 2016-03-21 NOTE — Telephone Encounter (Signed)
-----   Message from Paulla Dolly, RN sent at 03/18/2016  3:51 PM EDT ----- Regarding: first chemo  Dr. Bonnielee HaffCS:[id number] First chemo. Carbo taxol.  Dr Marko Plume

## 2016-03-21 NOTE — Telephone Encounter (Signed)
LM for Ms Savidge to call back and let Dr. Mariana Kaufman nurse know how she is doing after her treatment 03-18-16.

## 2016-03-23 DIAGNOSIS — Z85820 Personal history of malignant melanoma of skin: Secondary | ICD-10-CM | POA: Diagnosis not present

## 2016-03-23 DIAGNOSIS — D485 Neoplasm of uncertain behavior of skin: Secondary | ICD-10-CM | POA: Diagnosis not present

## 2016-03-23 DIAGNOSIS — C4441 Basal cell carcinoma of skin of scalp and neck: Secondary | ICD-10-CM | POA: Diagnosis not present

## 2016-03-23 DIAGNOSIS — Z08 Encounter for follow-up examination after completed treatment for malignant neoplasm: Secondary | ICD-10-CM | POA: Diagnosis not present

## 2016-03-24 ENCOUNTER — Encounter: Payer: Self-pay | Admitting: Oncology

## 2016-03-24 ENCOUNTER — Ambulatory Visit: Payer: Medicare Other

## 2016-03-24 ENCOUNTER — Ambulatory Visit (HOSPITAL_BASED_OUTPATIENT_CLINIC_OR_DEPARTMENT_OTHER): Payer: Medicare Other | Admitting: Oncology

## 2016-03-24 ENCOUNTER — Other Ambulatory Visit (HOSPITAL_BASED_OUTPATIENT_CLINIC_OR_DEPARTMENT_OTHER): Payer: Medicare Other

## 2016-03-24 VITALS — BP 110/63 | HR 73 | Temp 98.1°F | Resp 18 | Ht 64.0 in | Wt 142.7 lb

## 2016-03-24 DIAGNOSIS — Z95828 Presence of other vascular implants and grafts: Secondary | ICD-10-CM

## 2016-03-24 DIAGNOSIS — C5702 Malignant neoplasm of left fallopian tube: Secondary | ICD-10-CM

## 2016-03-24 DIAGNOSIS — C5700 Malignant neoplasm of unspecified fallopian tube: Secondary | ICD-10-CM

## 2016-03-24 DIAGNOSIS — Z85820 Personal history of malignant melanoma of skin: Secondary | ICD-10-CM | POA: Diagnosis not present

## 2016-03-24 DIAGNOSIS — Z5111 Encounter for antineoplastic chemotherapy: Secondary | ICD-10-CM

## 2016-03-24 LAB — CBC WITH DIFFERENTIAL/PLATELET
BASO%: 0.3 % (ref 0.0–2.0)
BASOS ABS: 0 10*3/uL (ref 0.0–0.1)
EOS ABS: 0.2 10*3/uL (ref 0.0–0.5)
EOS%: 7 % (ref 0.0–7.0)
HEMATOCRIT: 35.6 % (ref 34.8–46.6)
HGB: 11.8 g/dL (ref 11.6–15.9)
LYMPH#: 0.8 10*3/uL — AB (ref 0.9–3.3)
LYMPH%: 24.3 % (ref 14.0–49.7)
MCH: 29.1 pg (ref 25.1–34.0)
MCHC: 33.1 g/dL (ref 31.5–36.0)
MCV: 87.7 fL (ref 79.5–101.0)
MONO#: 0.1 10*3/uL (ref 0.1–0.9)
MONO%: 2.6 % (ref 0.0–14.0)
NEUT#: 2.3 10*3/uL (ref 1.5–6.5)
NEUT%: 65.8 % (ref 38.4–76.8)
PLATELETS: 203 10*3/uL (ref 145–400)
RBC: 4.06 10*6/uL (ref 3.70–5.45)
RDW: 12.7 % (ref 11.2–14.5)
WBC: 3.4 10*3/uL — ABNORMAL LOW (ref 3.9–10.3)

## 2016-03-24 LAB — COMPREHENSIVE METABOLIC PANEL WITH GFR
ALT: 57 U/L — ABNORMAL HIGH (ref 0–55)
AST: 25 U/L (ref 5–34)
Albumin: 3.7 g/dL (ref 3.5–5.0)
Alkaline Phosphatase: 82 U/L (ref 40–150)
Anion Gap: 9 meq/L (ref 3–11)
BUN: 14.9 mg/dL (ref 7.0–26.0)
CO2: 23 meq/L (ref 22–29)
Calcium: 8.9 mg/dL (ref 8.4–10.4)
Chloride: 105 meq/L (ref 98–109)
Creatinine: 0.8 mg/dL (ref 0.6–1.1)
EGFR: 77 ml/min/1.73 m2 — ABNORMAL LOW
Glucose: 94 mg/dL (ref 70–140)
Potassium: 4.2 meq/L (ref 3.5–5.1)
Sodium: 136 meq/L (ref 136–145)
Total Bilirubin: 0.35 mg/dL (ref 0.20–1.20)
Total Protein: 6.8 g/dL (ref 6.4–8.3)

## 2016-03-24 MED ORDER — SODIUM CHLORIDE 0.9 % IJ SOLN
10.0000 mL | INTRAMUSCULAR | Status: DC | PRN
  Filled 2016-03-24: qty 10

## 2016-03-24 MED ORDER — DEXAMETHASONE 4 MG PO TABS
ORAL_TABLET | ORAL | Status: DC
Start: 2016-03-24 — End: 2016-03-31

## 2016-03-24 MED ORDER — HEPARIN SOD (PORK) LOCK FLUSH 100 UNIT/ML IV SOLN
500.0000 [IU] | Freq: Once | INTRAVENOUS | Status: DC | PRN
  Filled 2016-03-24: qty 5

## 2016-03-24 NOTE — Progress Notes (Signed)
Pt was not wearing emla cream and requested that her labs be drawn peripherally

## 2016-03-24 NOTE — Progress Notes (Signed)
OFFICE PROGRESS NOTE   March 24, 2016   Physicians: Everitt Amber cc [name], MD, Uvaldo Rising, Delfin Edis), 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 She has done well overall with first chemo thus far, counts still ok day 7 cycle 1 today.  Patient had nausea and minimal vomiting on day 4, improved with antiemetics which she probably needs to continue on scheduled basis a little longer for next cycle. She took miralax today as bowels are not moving too well. She is eating some and trying to drink fluids. She had some leg pain, likely taxol aches, better with advil.  She is not too fatigued now. No fever, no SOB, no other pain, bladder ok, no LE swelling, no bleeding, no peripheral neuropathy. PAC has not been uncomfortable, worked well for chemo. She has been walking more, making adjustments for gardening so that she is not lifting. Per gyn onc, should not try stationary bike for several more weeks then start gradually.   She had biopsy of scalp lesion by dermatologist this week, using topical antibiotic there.  Remainder of 10 point Review of Systems negative/ unchanged.   PAC by IR 03-15-16 CA 125 preoperatively 656 Needs genetics consultation   ONCOLOGIC HISTORY Patient had history of benign right ovarian cyst and uterine fibroids, for which she saw Dr Josephina Shih in 08-2014, CA 125 slightly elevated then which was felt due to fibroids. She developed mild RUQ discomfort 08-2015, which she noticed mostly when taking a deep breath. She had CT AP by Dr Joylene Draft 01-27-16 which showed carcinomatosis, with right adnexal lesion 2.8 x 2.7 x 3.5 cm without solid component, unremarkable left ovary, no ascites, no abdominal or pelvic adenopathy. CA 125 was 656 on 02-08-16. 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 CXR 02-16-16 small amount of right pleural thickening or fluid and linear atelectasis or scarring left lung base.She had exploratory laparotomy with TAH, BSO, omentectomy and radical debulking by Dr Denman George on 02-18-16, intraoperative findings remarkable for 10 cm omental cake, milial studding 1 mm pelvic surfaces, right colic gutter, right diaphragm and liver, mesentery of transverse colon and rectum, 3 cm cystic mass in right ovary. Procedure included argon beam coagulation of miliary tumor implants, with no residual disease apparent at completion. Surgical pathology [id number]) high grade serous carcinoma of left fallopian tube involving omentum, with benign serous right ovarian cyst 3.5 cm. Post operatively she has slow recovery of bowel function, but was stable for DC on 02-22-16. Case was presented at gyn oncology multidisciplinary conference on 03-07-16, with recommendation for 6 cycles of adjuvant carboplatin taxol. Cycle 1 carboplatin taxol was given 03-18-16.   Objective:  Vital signs in last 24 hours:  BP 110/63 mmHg  Pulse 73  Temp(Src) 98.1 F (36.7 C) (Oral)  Resp 18  Ht _0  (1.626 m)  Wt 142 lb 11.2 oz (64.728 kg)  BMI 24.48 kg/m2  SpO2 100% Weight down 1 lb. Alert, oriented and appropriate. Ambulatory without difficulty.  No alopecia  HEENT:PERRL, sclerae not icteric. Oral mucosa moist without lesions, posterior pharynx clear.  No JVD. Apparent punch biopsy site top of scalp posteriorly clean, no bleeding, no erythema.  Lymphatics:no supraclavicular  or inguinal adenopathy Resp: clear to auscultation bilaterally and normal percussion bilaterally Cardio: regular rate and rhythm. No gallop. GI: soft, nontender, not distended, no mass or organomegaly. Normally active bowel sounds. Surgical incision not  tender, closed, surgical glue still on. Musculoskeletal/ Extremities: without pitting edema, cords, tenderness Neuro: no peripheral neuropathy. Otherwise nonfocal. PSYCH appropriate mood and affect Skin without rash, ecchymosis, petechiae Portacath- minimal resolving ecchymosis from placement, good position, without erythema or significant tenderness  Lab Results:  Results for orders placed or performed in visit on 03/24/16  CBC with Differential  Result Value Ref Range   WBC 3.4 (L) 3.9 - 10.3 10e3/uL   NEUT# 2.3 1.5 - 6.5 10e3/uL   HGB 11.8 11.6 - 15.9 g/dL   HCT 35.6 34.8 - 46.6 %   Platelets 203 145 - 400 10e3/uL   MCV 87.7 79.5 - 101.0 fL   MCH 29.1 25.1 - 34.0 pg   MCHC 33.1 31.5 - 36.0 g/dL   RBC 4.06 3.70 - 5.45 10e6/uL   RDW 12.7 11.2 - 14.5 %   lymph# 0.8 (L) 0.9 - 3.3 10e3/uL   MONO# 0.1 0.1 - 0.9 10e3/uL   Eosinophils Absolute 0.2 0.0 - 0.5 10e3/uL   Basophils Absolute 0.0 0.0 - 0.1 10e3/uL   NEUT% 65.8 38.4 - 76.8 %   LYMPH% 24.3 14.0 - 49.7 %   MONO% 2.6 0.0 - 14.0 %   EOS% 7.0 0.0 - 7.0 %   BASO% 0.3 0.0 - 2.0 %  Comprehensive metabolic panel  Result Value Ref Range   Sodium 136 136 - 145 mEq/L   Potassium 4.2 3.5 - 5.1 mEq/L   Chloride 105 98 - 109 mEq/L   CO2 23 22 - 29 mEq/L   Glucose 94 70 - 140 mg/dl   BUN 14.9 7.0 - 26.0 mg/dL   Creatinine 0.8 0.6 - 1.1 mg/dL   Total Bilirubin 0.35 0.20 - 1.20 mg/dL   Alkaline Phosphatase 82 40 - 150 U/L   AST 25 5 - 34 U/L   ALT 57 (H) 0 - 55 U/L   Total Protein 6.8 6.4 - 8.3 g/dL   Albumin 3.7 3.5 - 5.0 g/dL   Calcium 8.9 8.4 - 10.4 mg/dL   Anion Gap 9 3 - 11 mEq/L   EGFR 77 (L) >90 ml/min/1.73 m2   CBC reviewed with patient and husband at visit  CA 125  154 on 03-10-16 (surgery 02-18-16)   Studies/Results:  No results found.  Medications: I have reviewed the patient's current medications. Miralax and antiemetics discussed. OK for advil on limited basis with food and fluids prn taxol aches, but would need  to hold this if platelets low.  DISCUSSION Patient is pleased with how well she has done since first chemo, understands that we expect counts will drop more over next week or so. She is instructed to call if significantly fatigued by first of next week, as would get CBC prior to next MD visit on 6-15 if so. Discussed role for gCSF if counts drop to neutropenic range.   Medications and activity as noted.  Husband given copy of path report and ICD code for an insurance form.  Assessment/Plan:  1. 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 Counts beginning to drop but still ok today, repeat early next week if concerns otherwise at MD visit 03-31-16. Granix  available if needed. Needs genetics counseling, fallopian ca and also multiple melanomas. 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 Follows every 6 months with dermatologist Dr Linus Galas. Punch biopsy of scalp lesion this week, site ok. 3.inadequate peripheral venous access: PAC placed by IR prior to start of chemo 4.benign right ovarian cyst: evaluated 08-2014 at which time there was no evidence of malignancy, and confirmed by pathology from this surgery 5.elevated cholesterol followed by Dr Joylene Draft 6.diverticulosis by colonoscopy, no apparent symptoms 7.degenerative arthritis hands and knees, mild 8.history of migraine HAs, not exacerbated by zofran  9.social stress with  death of grandchild by suicide a year ago  26.advance directives in place   All questions answered and patient knows to call if needed prior to visit with lab next week. Time spent 25 min including >50% counseling and coordination of care. Route PCP, cc Dr Susie Cassette   Gordy Levan, MD   03/24/2016, 12:56 PM

## 2016-03-24 NOTE — Patient Instructions (Signed)

## 2016-03-26 ENCOUNTER — Telehealth: Payer: Self-pay | Admitting: Hematology

## 2016-03-26 DIAGNOSIS — C5700 Malignant neoplasm of unspecified fallopian tube: Secondary | ICD-10-CM | POA: Insufficient documentation

## 2016-03-26 DIAGNOSIS — Z85820 Personal history of malignant melanoma of skin: Secondary | ICD-10-CM | POA: Insufficient documentation

## 2016-03-26 NOTE — Telephone Encounter (Signed)
Pt called to report constipation. She is on colace twice daily, had miralax once yesterday but only had very small BM. She feels to urge to go, but no success. i encouraged her to try one more dose miralax, and can repeat 1-2 times today until have good BM, alternative lasitives also reviewed with her, she agrees with the plan.   Jessica Johnston  03/26/2016

## 2016-03-27 NOTE — Telephone Encounter (Signed)
Open in error

## 2016-03-29 ENCOUNTER — Ambulatory Visit: Admit: 2016-03-29 | Discharge: 2016-03-29 | Payer: MEDICARE | Attending: Family Medicine | Primary: Family Medicine

## 2016-03-29 ENCOUNTER — Inpatient Hospital Stay: Admit: 2016-04-11 | Payer: MEDICARE | Primary: Family Medicine

## 2016-03-29 DIAGNOSIS — E119 Type 2 diabetes mellitus without complications: Secondary | ICD-10-CM

## 2016-03-29 MED ORDER — ERGOCALCIFEROL (VITAMIN D2) 50,000 UNIT CAP
1250 mcg (50,000 unit) | ORAL_CAPSULE | ORAL | 4 refills | Status: DC
Start: 2016-03-29 — End: 2016-10-27

## 2016-03-29 NOTE — Progress Notes (Signed)
Auth to disclose form faxed to [employer] med recoreds-[phone]

## 2016-03-29 NOTE — Progress Notes (Signed)
Pt here for 58mofasting labs  Would like 90 day supply vit d

## 2016-03-29 NOTE — Patient Instructions (Signed)
Nutrition Tips for Diabetes: After Your Visit  Your Care Instructions  A healthy diet is important to manage diabetes. It helps you lose weight (if you need to) and keep it off. It gives you the nutrition and energy your body needs and helps prevent heart disease. But a diet for diabetes does not mean that you have to eat special foods. You can eat what your family eats, including occasional sweets and other favorites. But you do have to pay attention to how often you eat and how much you eat of certain foods. The right plan for you will give you meals that help you keep your blood sugar at healthy levels.  Try to eat a variety of foods and to spread carbohydrate throughout the day. Carbohydrate raises blood sugar higher and more quickly than any other nutrient does. Carbohydrate is found in sugar, breads and cereals, fruit, starchy vegetables such as potatoes and corn, and milk and yogurt.  You may want to work with a dietitian or diabetes educator to help you plan meals and snacks. A dietitian or diabetes educator also can help you lose weight if that is one of your goals. The following tips can help you enjoy your meals and stay healthy.  Follow-up care is a key part of your treatment and safety. Be sure to make and go to all appointments, and call your doctor if you are having problems. It???s also a good idea to know your test results and keep a list of the medicines you take.  How can you care for yourself at home?  ?? Learn which foods have carbohydrate and how much carbohydrate to eat. A dietitian or diabetes educator can help you learn to keep track of how much carbohydrate you eat.  ?? Spread carbohydrate throughout the day. Eat some carbohydrate at all meals, but do not eat too much at any one time.  ?? Plan meals to include food from all the food groups. These are the food groups and some example portion sizes:  ?? Grains: 1 slice of bread (1 ounce), ?? cup of cooked cereal, and 1/3 cup  of cooked pasta or rice. These have about 15 grams of carbohydrate in a serving. Choose whole grains such as whole wheat bread or crackers, oatmeal, and brown rice more often than refined grains.  ?? Fruit: 1 small fresh fruit, such as an apple or orange; ?? of a banana; ?? cup of chopped, cooked, or canned fruit; ?? cup of fruit juice; 1 cup of melon or raspberries; and 2 tablespoons of dried fruit. These have about 15 grams of carbohydrate in a serving.  ?? Dairy: 1 cup of nonfat or low-fat milk and 2/3 cup of plain yogurt. These have about 15 grams of carbohydrate in a serving.  ?? Protein foods: Beef, chicken, turkey, fish, eggs, tofu, cheese, cottage cheese, and peanut butter. A serving size of meat is 3 ounces, which is about the size of a deck of cards. Examples of meat substitute serving sizes (equal to 1 ounce of meat) are 1/4 cup of cottage cheese, 1 egg, 1 tablespoon of peanut butter, and ?? cup of tofu. These have very little or no carbohydrate per serving.  ?? Vegetables: Starchy vegetables such as ?? cup of cooked dried beans, peas, potatoes, or corn have about 15 grams of carbohydrate. Nonstarchy vegetables have very little carbohydrate, such as 1 cup of raw leafy vegetables (such as spinach), ?? cup of other vegetables (cooked or chopped), and 3/4 cup   of vegetable juice.  ?? Use the plate format to plan meals. It is a good, quick way to make sure that you have a balanced meal. It also helps you spread carbohydrate throughout the day. You divide your plate by types of foods. Put vegetables on half the plate, meat or meat substitutes on one-quarter of the plate, and a grain or starchy vegetable (such as brown rice or a potato) in the final quarter of the plate. To this you can add a small piece of fruit and 1 cup of milk or yogurt, depending on how much carbohydrate you are supposed to eat at a meal.  ?? Talk to your dietitian or diabetes educator about ways to add limited  amounts of sweets into your meal plan. You can eat these foods now and then, as long as you include the amount of carbohydrate they have in your daily carbohydrate allowance.  ?? If you drink alcohol, limit it to no more than 1 drink a day for women and 2 drinks a day for men. If you are pregnant, no amount of alcohol is known to be safe.  ?? Protein, fat, and fiber do not raise blood sugar as much as carbohydrate does. If you eat a lot of these nutrients in a meal, your blood sugar will rise more slowly than it would otherwise.  ?? Limit saturated fats, such as those from meat and dairy products. Try to replace it with monounsaturated fat, such as olive oil. This is a healthier choice because people who have diabetes are at higher-than-average risk of heart disease. But use a modest amount of olive oil. A tablespoon of olive oil has 14 grams of fat and 120 calories.  ?? Exercise lowers blood sugar. If you take insulin by shots or pump, you can use less than you would if you were not exercising. Keep in mind that timing matters. If you exercise within 1 hour after a meal, your body may need less insulin for that meal than it would if you exercised 3 hours after the meal. Test your blood sugar to find out how exercise affects your need for insulin.  ?? Exercise on most days of the week. Aim for at least 30 minutes. Exercise helps you stay at a healthy weight and helps your body use insulin. Walking is an easy way to get exercise. Gradually increase the amount you walk every day. You also may want to swim, bike, or do other activities.  When you eat out  ?? Learn to estimate the serving sizes of foods that have carbohydrate. If you measure food at home, it will be easier to estimate the amount in a serving of restaurant food.  ?? If the meal you order has too much carbohydrate (such as potatoes, corn, or baked beans), ask to have a low-carbohydrate food instead. Ask for a salad or green vegetables.   ?? If you use insulin, check your blood sugar before and after eating out to help you plan how much to eat in the future.  ?? If you eat more carbohydrate at a meal than you had planned, take a walk or do other exercise. This will help lower your blood sugar.   Where can you learn more?   Go to http://www.healthwise.net/[facility]  Enter I183 in the search box to learn more about "Nutrition Tips for Diabetes: After Your Visit."   ?? 2006-2014 [employer]. Care instructions adapted under license by [employer] (which disclaims liability or warranty for this   information). This care instruction is for use with your licensed healthcare professional. If you have questions about a medical condition or this instruction, always ask your healthcare professional. Healthwise, Incorporated disclaims any warranty or liability for your use of this information.  Content Version: 10.2.346038; Current as of: March 20, 2013

## 2016-03-29 NOTE — Progress Notes (Signed)
Auth to disclose form faxed to [employer]. Original placed in scan folder.

## 2016-03-29 NOTE — Progress Notes (Signed)
Dawn Green is a 75 y.o. female   Chief Complaint   Patient presents with   ??? Labs    pt here for recheck of her DM and states that she has stopped the metformin as we discussed but does have a hard time following a diabetic diet.  Pt also with hypertriglycerides and is using krill oil everyday, '350mg'$ .  Pt did have an eye exam and will request record.  Pt also states had a PNA vaccine and will find out which one not sure if 23 or 13.    Also with low Vit D and would like 3 month supply.      Pt also with small bump L wrist bothers her at times.    she is a 75 y.o. year old female who presents for evalution.      Reviewed PmHx, RxHx, FmHx, SocHx, AllgHx and updated and dated in the chart.    Review of Systems - negative except as listed above in the HPI    Objective:     Vitals:    03/29/16 1022   BP: 128/68   Pulse: 65   Resp: 18   Temp: 98 ??F (36.7 ??C)   TempSrc: Oral   SpO2: 97%   Weight: 160 lb 9.6 oz (72.8 kg)   Height: '5\' 3"'$  (1.6 m)       Current Outpatient Prescriptions   Medication Sig   ??? ergocalciferol (VITAMIN D2) 50,000 unit capsule TAKE 1 CAPSULE EVERY 7 DAYS   ??? Cetirizine (ZYRTEC) 10 mg cap Take  by mouth.   ??? venlafaxine-SR (EFFEXOR-XR) 37.5 mg capsule Take 1 Cap by mouth daily. Dose may be taken before bedtime.   ??? valsartan-hydroCHLOROthiazide (DIOVAN-HCT) 80-12.5 mg per tablet Take 1 Tab by mouth daily.   ??? atorvastatin (LIPITOR) 40 mg tablet Take 1 Tab by mouth Daily (before breakfast).   ??? anastrozole (ARIMIDEX) 1 mg tablet TAKE 1 TABLET ONCE DAILY   ??? alendronate (FOSAMAX) 35 mg tablet Take 1 Tab by mouth every seven (7) days.   ??? krill-om-3-dha-epa-phospho-ast (MEGARED OMEGA-3 KRILL OIL) 1,000-230-60 mg cap Take 1 Tab by mouth two (2) times a day.   ??? ipratropium (ATROVENT) 0.03 % nasal spray 2 Sprays by Both Nostrils route every twelve (12) hours. (Patient taking differently: 2 Sprays by Both Nostrils route as needed.)   ??? aspirin 81 mg chewable tablet Take 81 mg by mouth daily.    ??? venlafaxine-SR (EFFEXOR-XR) 75 mg capsule    ??? metFORMIN (GLUCOPHAGE) 500 mg tablet Take 1 Tab by mouth two (2) times daily (with meals).   ??? cholecalciferol (VITAMIN D3) 400 unit tab tablet Take 400 Units by mouth daily.   ??? brimonidine-timolol (COMBIGAN) 0.2-0.5 % drop ophthalmic solution Administer 1 Drop to both eyes every twelve (12) hours.     No current facility-administered medications for this visit.        Physical Examination: General appearance - alert, well appearing, and in no distress  Mental status - alert, oriented to person, place, and time  Eyes - pupils equal and reactive, extraocular eye movements intact  Chest - clear to auscultation, no wheezes, rales or rhonchi, symmetric air entry  Heart - normal rate, regular rhythm, normal S1, S2, no murmurs, rubs, clicks or gallops    Small palpable nodule on L wrist just distal to ulnar styloid  Assessment/ Plan:   [name] was seen today for labs.    Diagnoses and all orders for this visit:  Controlled type 2 diabetes mellitus without complication, without long-term current use of insulin (HCC)  -     HEMOGLOBIN A1C WITH EAG  -     HM DIABETES FOOT EXAM    Hypertriglyceridemia  -     LIPID PANEL    Hypovitaminosis D  -     ergocalciferol (VITAMIN D2) 50,000 unit capsule; TAKE 1 CAPSULE EVERY 7 DAYS  -     VITAMIN D, 25 HYDROXY    Mass of left wrist  -     XR WRIST LT AP/LAT; Future       Follow-up Disposition:  Return if symptoms worsen or fail to improve.    I have discussed the diagnosis with the patient and the intended plan as seen in the above orders.  The patient has received an after-visit summary and questions were answered concerning future plans. Pt conveyed understanding of plan.    Medication Side Effects and Warnings were discussed with patient      Esau Grew, DO

## 2016-03-30 ENCOUNTER — Other Ambulatory Visit: Payer: Self-pay | Admitting: Oncology

## 2016-03-30 ENCOUNTER — Telehealth: Payer: Self-pay

## 2016-03-30 LAB — HEMOGLOBIN A1C WITH EAG
Estimated average glucose: 123 mg/dL
Hemoglobin A1c: 5.9 % — ABNORMAL HIGH (ref 4.8–5.6)

## 2016-03-30 LAB — LIPID PANEL
Cholesterol, total: 187 mg/dL (ref 100–199)
HDL Cholesterol: 42 mg/dL (ref >39)
Triglyceride: 492 mg/dL — ABNORMAL HIGH (ref 0–149)

## 2016-03-30 LAB — DIABETES PATIENT EDUCATION

## 2016-03-30 LAB — CVD REPORT

## 2016-03-30 LAB — VITAMIN D, 25 HYDROXY: VITAMIN D, 25-HYDROXY: 30.6 ng/mL (ref 30.0–100.0)

## 2016-03-30 NOTE — Progress Notes (Signed)
Your prediabetes has improved!  Keep up the good work.  But your triglycerides remain high.  Double your dose of krill oil and lets recheck in 3-6 months.  Your Vit D is normal continue with the supplement.

## 2016-03-30 NOTE — Telephone Encounter (Signed)
Called pt and she is doing well. She did have constipation over weekend and ended up talking with Dr Burr Medico. That issue is resolved. She will keep tomorrow's appt

## 2016-03-30 NOTE — Telephone Encounter (Signed)
-----   Message from Gordy Levan, MD sent at 03/26/2016  7:40 PM EDT ----- Counts may be low before I see her on Thurs 6-15.  I told her to call if very fatigued early in week, as would get CBC then if so.  thx

## 2016-03-31 ENCOUNTER — Telehealth: Payer: Self-pay | Admitting: Oncology

## 2016-03-31 ENCOUNTER — Encounter: Payer: Self-pay | Admitting: Oncology

## 2016-03-31 ENCOUNTER — Ambulatory Visit (HOSPITAL_BASED_OUTPATIENT_CLINIC_OR_DEPARTMENT_OTHER): Payer: Medicare Other | Admitting: Oncology

## 2016-03-31 ENCOUNTER — Ambulatory Visit: Payer: Medicare Other

## 2016-03-31 ENCOUNTER — Other Ambulatory Visit (HOSPITAL_BASED_OUTPATIENT_CLINIC_OR_DEPARTMENT_OTHER): Payer: Medicare Other

## 2016-03-31 VITALS — BP 115/60 | HR 77 | Temp 98.3°F | Resp 18 | Ht 64.0 in | Wt 142.8 lb

## 2016-03-31 DIAGNOSIS — C5700 Malignant neoplasm of unspecified fallopian tube: Secondary | ICD-10-CM

## 2016-03-31 DIAGNOSIS — T451X5A Adverse effect of antineoplastic and immunosuppressive drugs, initial encounter: Secondary | ICD-10-CM

## 2016-03-31 DIAGNOSIS — C4491 Basal cell carcinoma of skin, unspecified: Secondary | ICD-10-CM

## 2016-03-31 DIAGNOSIS — D701 Agranulocytosis secondary to cancer chemotherapy: Secondary | ICD-10-CM

## 2016-03-31 DIAGNOSIS — C5702 Malignant neoplasm of left fallopian tube: Secondary | ICD-10-CM

## 2016-03-31 DIAGNOSIS — Z95828 Presence of other vascular implants and grafts: Secondary | ICD-10-CM

## 2016-03-31 LAB — COMPREHENSIVE METABOLIC PANEL
ALT: 68 U/L — AB (ref 0–55)
ANION GAP: 7 meq/L (ref 3–11)
AST: 37 U/L — ABNORMAL HIGH (ref 5–34)
Albumin: 3.5 g/dL (ref 3.5–5.0)
Alkaline Phosphatase: 97 U/L (ref 40–150)
BUN: 8.5 mg/dL (ref 7.0–26.0)
CALCIUM: 9.1 mg/dL (ref 8.4–10.4)
CHLORIDE: 105 meq/L (ref 98–109)
CO2: 26 mEq/L (ref 22–29)
CREATININE: 0.7 mg/dL (ref 0.6–1.1)
EGFR: 85 mL/min/{1.73_m2} — ABNORMAL LOW (ref >90)
Glucose: 106 mg/dl (ref 70–140)
POTASSIUM: 4.1 meq/L (ref 3.5–5.1)
Sodium: 139 mEq/L (ref 136–145)
Total Bilirubin: <0.3 mg/dL (ref 0.20–1.20)
Total Protein: 6.7 g/dL (ref 6.4–8.3)

## 2016-03-31 LAB — CBC WITH DIFFERENTIAL/PLATELET
BASO%: 0.5 % (ref 0.0–2.0)
BASOS ABS: 0 10*3/uL (ref 0.0–0.1)
EOS ABS: 0.1 10*3/uL (ref 0.0–0.5)
EOS%: 5.9 % (ref 0.0–7.0)
HEMATOCRIT: 34.4 % — AB (ref 34.8–46.6)
HGB: 11.3 g/dL — ABNORMAL LOW (ref 11.6–15.9)
LYMPH#: 1 10*3/uL (ref 0.9–3.3)
LYMPH%: 47.3 % (ref 14.0–49.7)
MCH: 29.3 pg (ref 25.1–34.0)
MCHC: 32.8 g/dL (ref 31.5–36.0)
MCV: 89.1 fL (ref 79.5–101.0)
MONO#: 0.5 10*3/uL (ref 0.1–0.9)
MONO%: 20.5 % — ABNORMAL HIGH (ref 0.0–14.0)
NEUT#: 0.6 10*3/uL — ABNORMAL LOW (ref 1.5–6.5)
NEUT%: 25.8 % — AB (ref 38.4–76.8)
PLATELETS: 243 10*3/uL (ref 145–400)
RBC: 3.86 10*6/uL (ref 3.70–5.45)
RDW: 13.2 % (ref 11.2–14.5)
WBC: 2.2 10*3/uL — ABNORMAL LOW (ref 3.9–10.3)

## 2016-03-31 MED ORDER — HEPARIN SOD (PORK) LOCK FLUSH 100 UNIT/ML IV SOLN
500.0000 [IU] | Freq: Once | INTRAVENOUS | Status: DC | PRN
  Filled 2016-03-31: qty 5

## 2016-03-31 MED ORDER — DEXAMETHASONE 4 MG PO TABS: ORAL_TABLET | ORAL | Status: DC

## 2016-03-31 MED ORDER — SODIUM CHLORIDE 0.9 % IJ SOLN
10.0000 mL | INTRAMUSCULAR | Status: DC | PRN
  Filled 2016-03-31: qty 10

## 2016-03-31 MED ORDER — TBO-FILGRASTIM 300 MCG/0.5ML ~~LOC~~ SOSY
300.0000 ug | PREFILLED_SYRINGE | Freq: Once | SUBCUTANEOUS | Status: AC
  Administered 2016-03-31: 300 ug via SUBCUTANEOUS
  Filled 2016-03-31: qty 0.5

## 2016-03-31 NOTE — Progress Notes (Signed)
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 She is neutropenic day 14 cycle 1 today, not febrile. Granix begun today.  Patient actually is feeling well now. She was more constipated despite some miralax after I saw her on 03-24-16, called on call on 6-10 and did well after increasing miralax. Bowels are now moving well without laxative. 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 No problems with PAC per patient (slight rash see below). Bladder ok. No aches not. She is still careful not to lift. No peripheral neuropathy Remainder of 10 point Review of Systems negative.    PAC by IR 03-15-16 CA 125 preoperatively 656 Needs genetics consultation  ONCOLOGIC HISTORY Patient had history of benign right ovarian cyst and uterine fibroids, for which she saw Dr Josephina Shih in 08-2014, CA 125 slightly elevated then which was felt due to fibroids. She developed mild RUQ discomfort 08-2015, which she noticed mostly when taking a deep breath. She had CT AP by Dr Joylene Draft 01-27-16 which showed carcinomatosis, with right adnexal lesion 2.8 x 2.7 x 3.5 cm without solid component, unremarkable left ovary, no ascites, no abdominal or pelvic adenopathy. CA 125 was 656 on 02-08-16. 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 CXR 02-16-16 small amount of right pleural  thickening or fluid and linear atelectasis or scarring left lung base.She had exploratory laparotomy with TAH, BSO, omentectomy and radical debulking by Dr Denman George on 02-18-16, intraoperative findings remarkable for 10 cm omental cake, milial studding 1 mm pelvic surfaces, right colic gutter, right diaphragm and liver, mesentery of transverse colon and rectum, 3 cm cystic mass in right ovary. Procedure included argon beam coagulation of miliary tumor implants, with no residual disease apparent at completion. 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 Post operatively she has slow recovery of bowel function, but was stable for DC on 02-22-16. Case was presented at gyn oncology multidisciplinary conference on 03-07-16, with recommendation for 6 cycles of adjuvant carboplatin taxol. Cycle 1 carboplatin taxol was given 03-18-16, neutropenic with ANC 0.2 on day 14 cycle 1.   Objective:  Vital signs in last 24 hours:  BP 115/60 mmHg  Pulse 77  Temp(Src) 98.3 F (36.8 C) (Oral)  Resp 18  Ht 5' 4" (1.626 m)  Wt 142 lb 12.8 oz (64.774 kg)  BMI 24.50 kg/m2  SpO2 100% Weight stable Alert, oriented and appropriate, talkative and pleasant. Ambulatory without difficulty, looks comfortable, respirations not labored. No alopecia  HEENT:PERRL, sclerae not icteric. Oral mucosa moist without lesions, posterior pharynx clear.  Neck supple. No JVD.  Lymphatics:no cervical,supraclavicular  or inguinal adenopathy Resp: clear to auscultation bilaterally and normal percussion bilaterally Cardio: regular rate and rhythm. No gallop. GI: soft, nontender, not distended, no mass or organomegaly. Normally active bowel sounds. Surgical incision well healed, not tender Musculoskeletal/  Extremities: without pitting edema, cords, tenderness Neuro: no peripheral neuropathy. Otherwise nonfocal. PSYCH appropriate mood and  affect Skin below PAC in area where dressing/ prep would have been is slight palpable erythematous skin irritation without evidence of infection, surgical glue still at [facility] and no irritation over access or superiorly. Scalp area of recent punch biopsy almost completely healed, with only ~ 41m still slight eschar, no erythema or tenderness. Otherwise without ecchymosis, petechiae Portacath-as above.   Lab Results:  Results for orders placed or performed in visit on 03/31/16  CBC with Differential  Result Value Ref Range   WBC 2.2 (L) 3.9 - 10.3 10e3/uL   NEUT# 0.6 (L) 1.5 - 6.5 10e3/uL   HGB 11.3 (L) 11.6 - 15.9 g/dL   HCT 34.4 (L) 34.8 - 46.6 %   Platelets 243 145 - 400 10e3/uL   MCV 89.1 79.5 - 101.0 fL   MCH 29.3 25.1 - 34.0 pg   MCHC 32.8 31.5 - 36.0 g/dL   RBC 3.86 3.70 - 5.45 10e6/uL   RDW 13.2 11.2 - 14.5 %   lymph# 1.0 0.9 - 3.3 10e3/uL   MONO# 0.5 0.1 - 0.9 10e3/uL   Eosinophils Absolute 0.1 0.0 - 0.5 10e3/uL   Basophils Absolute 0.0 0.0 - 0.1 10e3/uL   NEUT% 25.8 (L) 38.4 - 76.8 %   LYMPH% 47.3 14.0 - 49.7 %   MONO% 20.5 (H) 0.0 - 14.0 %   EOS% 5.9 0.0 - 7.0 %   BASO% 0.5 0.0 - 2.0 %  Comprehensive metabolic panel  Result Value Ref Range   Sodium 139 136 - 145 mEq/L   Potassium 4.1 3.5 - 5.1 mEq/L   Chloride 105 98 - 109 mEq/L   CO2 26 22 - 29 mEq/L   Glucose 106 70 - 140 mg/dl   BUN 8.5 7.0 - 26.0 mg/dL   Creatinine 0.7 0.6 - 1.1 mg/dL   Total Bilirubin <0.30 0.20 - 1.20 mg/dL   Alkaline Phosphatase 97 40 - 150 U/L   AST 37 (H) 5 - 34 U/L   ALT 68 (H) 0 - 55 U/L   Total Protein 6.7 6.4 - 8.3 g/dL   Albumin 3.5 3.5 - 5.0 g/dL   Calcium 9.1 8.4 - 10.4 mg/dL   Anion Gap 7 3 - 11 mEq/L   EGFR 85 (L) >90 ml/min/1.73 m2     Studies/Results:  No results found.  Medications: I have reviewed the patient's current medications. Granix 300 today and 6-16. Did use claritin after taxol for aches and can use that also for gCSF aches.  She understands premed taxol  prior to cycle 2  DISCUSSION Chemo neutropenia and neutropenic precautions discussed, gCSF discussed including mechanism of action and possible aches. Probably counts will recover quickly with granix today and 6-16. Will check CBC again on 6-19, additional granix then if [place]<1.5. She will have cycle 2 same dosing on 6-23, add granix 6-26, 6-27 and likely with my visit 6-29 (taxol aches ~ day 3-4 cycle 1, not severe).  Claritin as above.  Continue antibiotic topical to scalp biopsy site next couple of days. This was basal cell, will need MOHs. Timing of that procedure will need to be coordinated with chemo due to cytopenias, or optimally delayed until after chemo completes if Dr HSusie Cassetteagrees. Patient knows that I am glad to discuss with Dr HSusie Cassetteif needed.  Diet discussed. She would like to lose ~ 5 lbs prior to grandchild's wedding in Oct, fine if this  is with healthier diet/ less junk calories.  Will be aware of skin irritation with PAC dressing/ prep etc. She will call if this is worse.    Scheduling corrected for cycle 2 on 04-08-16.    Assessment/Plan:  1. 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 Neutropenic today (day 14 cycle 1)  but fortunately not febrile. Granix now and will use closer to chemo with subsequent treatments. Neutropenic precautions, recheck CBC on 6-19. If counts recover will keep chemo on schedule with cycle 2 on 04-08-16. Needs genetics counseling, fallopian ca and also multiple melanomas. 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 Follows every 6 months with dermatologist Dr Linus Galas. Punch biopsy of scalp healing well, will need MOHs for basal cell there, timing as above. 3.inadequate peripheral venous access: PAC placed by IR prior to start of chemo. Slight skin irritation  likely from dressing/ prep, which we will adjust in future if possible.  4.benign right ovarian cyst: evaluated 08-2014 at which time there was no evidence of malignancy, and confirmed by pathology from this surgery 5.elevated cholesterol followed by Dr Joylene Draft 6.diverticulosis by colonoscopy, no apparent symptoms 7.degenerative arthritis hands and knees, mild 8.history of migraine HAs, not exacerbated by zofran  9.social stress with death of grandchild by suicide a year ago  74.advance directives in place  All questions answered and she is in agreement with recommendations and plans. Granix orders placed for 6-15, 6-16 and possible 6-19, also 6-26, 6-27, 6-29.  Chemo orders completed for 6-23, will treat if ANC >=1.5 and plt >=100k. Time spent 30 min including >50% counseling and coordination of care. Route Dr Joylene Draft, cc Dr Susie Cassette, in epic for other MDs    Gordy Levan, MD   03/31/2016, 5:14 PM

## 2016-03-31 NOTE — Telephone Encounter (Signed)
appt made and avs printed °

## 2016-03-31 NOTE — Progress Notes (Signed)
Patient in for [facility] flush and labs today. Patient request labs to be drawn from her arm. All labs drawn by Rachel Moulds, Phlebotomist today.

## 2016-04-01 ENCOUNTER — Ambulatory Visit (HOSPITAL_BASED_OUTPATIENT_CLINIC_OR_DEPARTMENT_OTHER): Payer: Medicare Other

## 2016-04-01 VITALS — BP 114/50 | HR 78 | Temp 98.4°F | Resp 18

## 2016-04-01 DIAGNOSIS — C4491 Basal cell carcinoma of skin, unspecified: Secondary | ICD-10-CM | POA: Insufficient documentation

## 2016-04-01 DIAGNOSIS — D701 Agranulocytosis secondary to cancer chemotherapy: Secondary | ICD-10-CM | POA: Diagnosis not present

## 2016-04-01 DIAGNOSIS — T451X5A Adverse effect of antineoplastic and immunosuppressive drugs, initial encounter: Secondary | ICD-10-CM

## 2016-04-01 DIAGNOSIS — Z85828 Personal history of other malignant neoplasm of skin: Secondary | ICD-10-CM | POA: Insufficient documentation

## 2016-04-01 DIAGNOSIS — Z95828 Presence of other vascular implants and grafts: Secondary | ICD-10-CM

## 2016-04-01 DIAGNOSIS — C5702 Malignant neoplasm of left fallopian tube: Secondary | ICD-10-CM

## 2016-04-01 MED ORDER — TBO-FILGRASTIM 300 MCG/0.5ML ~~LOC~~ SOSY
300.0000 ug | PREFILLED_SYRINGE | Freq: Once | SUBCUTANEOUS | Status: AC
Start: 2016-04-01 — End: 2016-04-01
  Administered 2016-04-01: 300 ug via SUBCUTANEOUS
  Filled 2016-04-01: qty 0.5

## 2016-04-01 NOTE — Patient Instructions (Signed)

## 2016-04-04 ENCOUNTER — Ambulatory Visit: Payer: Medicare Other

## 2016-04-04 ENCOUNTER — Other Ambulatory Visit (HOSPITAL_BASED_OUTPATIENT_CLINIC_OR_DEPARTMENT_OTHER): Payer: Medicare Other

## 2016-04-04 DIAGNOSIS — C5702 Malignant neoplasm of left fallopian tube: Secondary | ICD-10-CM

## 2016-04-04 LAB — CBC WITH DIFFERENTIAL/PLATELET
BASO%: 0.4 % (ref 0.0–2.0)
BASOS ABS: 0 10*3/uL (ref 0.0–0.1)
EOS%: 2.3 % (ref 0.0–7.0)
Eosinophils Absolute: 0.2 10*3/uL (ref 0.0–0.5)
HEMATOCRIT: 37.4 % (ref 34.8–46.6)
HGB: 12.3 g/dL (ref 11.6–15.9)
LYMPH#: 1.1 10*3/uL (ref 0.9–3.3)
LYMPH%: 16.7 % (ref 14.0–49.7)
MCH: 29 pg (ref 25.1–34.0)
MCHC: 32.8 g/dL (ref 31.5–36.0)
MCV: 88.4 fL (ref 79.5–101.0)
MONO#: 0.9 10*3/uL (ref 0.1–0.9)
MONO%: 14.4 % — ABNORMAL HIGH (ref 0.0–14.0)
NEUT#: 4.3 10*3/uL (ref 1.5–6.5)
NEUT%: 66.2 % (ref 38.4–76.8)
PLATELETS: 254 10*3/uL (ref 145–400)
RBC: 4.24 10*6/uL (ref 3.70–5.45)
RDW: 14 % (ref 11.2–14.5)
WBC: 6.6 10*3/uL (ref 3.9–10.3)

## 2016-04-04 NOTE — Progress Notes (Signed)
Pt's ANC count was 4.3 on todays labs. Injection held per Dr. Mariana Kaufman note.

## 2016-04-08 ENCOUNTER — Ambulatory Visit: Payer: Medicare Other

## 2016-04-08 ENCOUNTER — Other Ambulatory Visit (HOSPITAL_BASED_OUTPATIENT_CLINIC_OR_DEPARTMENT_OTHER): Payer: Medicare Other

## 2016-04-08 ENCOUNTER — Telehealth: Payer: Self-pay

## 2016-04-08 DIAGNOSIS — C5702 Malignant neoplasm of left fallopian tube: Secondary | ICD-10-CM

## 2016-04-08 DIAGNOSIS — Z95828 Presence of other vascular implants and grafts: Secondary | ICD-10-CM

## 2016-04-08 LAB — COMPREHENSIVE METABOLIC PANEL
ANION GAP: 13 meq/L — AB (ref 3–11)
AST: 73 U/L — AB (ref 5–34)
Albumin: 3.6 g/dL (ref 3.5–5.0)
Alkaline Phosphatase: 241 U/L — ABNORMAL HIGH (ref 40–150)
BUN: 15 mg/dL (ref 7.0–26.0)
CHLORIDE: 104 meq/L (ref 98–109)
CO2: 21 meq/L — AB (ref 22–29)
CREATININE: 0.7 mg/dL (ref 0.6–1.1)
Calcium: 9.8 mg/dL (ref 8.4–10.4)
EGFR: 79 mL/min/{1.73_m2} — ABNORMAL LOW (ref >90)
GLUCOSE: 218 mg/dL — AB (ref 70–140)
Potassium: 4.5 mEq/L (ref 3.5–5.1)
SODIUM: 138 meq/L (ref 136–145)
TOTAL PROTEIN: 7.3 g/dL (ref 6.4–8.3)
Total Bilirubin: 0.3 mg/dL (ref 0.20–1.20)

## 2016-04-08 LAB — CBC WITH DIFFERENTIAL/PLATELET
BASO%: 0.7 % (ref 0.0–2.0)
Basophils Absolute: 0 10*3/uL (ref 0.0–0.1)
EOS%: 0 % (ref 0.0–7.0)
Eosinophils Absolute: 0 10*3/uL (ref 0.0–0.5)
HCT: 38 % (ref 34.8–46.6)
HGB: 12.5 g/dL (ref 11.6–15.9)
LYMPH%: 12.1 % — AB (ref 14.0–49.7)
MCH: 28.8 pg (ref 25.1–34.0)
MCHC: 32.9 g/dL (ref 31.5–36.0)
MCV: 87.7 fL (ref 79.5–101.0)
MONO#: 0 10*3/uL — ABNORMAL LOW (ref 0.1–0.9)
MONO%: 1 % (ref 0.0–14.0)
NEUT%: 86.2 % — AB (ref 38.4–76.8)
NEUTROS ABS: 3.7 10*3/uL (ref 1.5–6.5)
Platelets: 232 10*3/uL (ref 145–400)
RBC: 4.33 10*6/uL (ref 3.70–5.45)
RDW: 13.6 % (ref 11.2–14.5)
WBC: 4.3 10*3/uL (ref 3.9–10.3)
lymph#: 0.5 10*3/uL — ABNORMAL LOW (ref 0.9–3.3)

## 2016-04-08 MED ORDER — LORAZEPAM 1 MG PO TABS
ORAL_TABLET | ORAL | Status: AC
  Filled 2016-04-08: qty 1

## 2016-04-08 MED ORDER — SODIUM CHLORIDE 0.9 % IJ SOLN
10.0000 mL | INTRAMUSCULAR | Status: DC | PRN
  Administered 2016-04-08: 10 mL via INTRAVENOUS
  Filled 2016-04-08: qty 10

## 2016-04-08 MED ORDER — DIPHENHYDRAMINE HCL 50 MG/ML IJ SOLN
INTRAMUSCULAR | Status: AC
  Filled 2016-04-08: qty 1

## 2016-04-08 MED ORDER — FAMOTIDINE IN NACL 20-0.9 MG/50ML-% IV SOLN
INTRAVENOUS | Status: AC
  Filled 2016-04-08: qty 50

## 2016-04-08 NOTE — Telephone Encounter (Signed)
Patient called requesting to speak with Jessica Johnston, APNP , writer offered her services , patient refused and gave her contact information [place]: [phone]. Jessica Johnston, APNP updated with the patient's request.

## 2016-04-08 NOTE — Patient Instructions (Signed)

## 2016-04-08 NOTE — Progress Notes (Signed)
Elevated LFT's. Patient has no related complaints. Dr. Lindi Adie notified. Will hold treatment for today per Dr. Lindi Adie. Patient will need repeat labs and f/u visit with Dr. Marko Plume next week. Patient aware of plan of care and agrees. Reviewed labs with patient and questions answered.

## 2016-04-08 NOTE — Telephone Encounter (Signed)
Patient called inquiring if she can safely increase her lift and out side gardening activities ,states she walking and tolerating if fine , but does feel as though it may be to much at times. States her walks are up and down hills . Patient was informed that it would be ok to start with some light gardening and she should monitor how she is feeling ,so that she is not over doing it. Patient states understanding , denies further questions at this time, Joylene John ,APNP is aware.

## 2016-04-10 ENCOUNTER — Other Ambulatory Visit: Payer: Self-pay | Admitting: Oncology

## 2016-04-10 DIAGNOSIS — R748 Abnormal levels of other serum enzymes: Secondary | ICD-10-CM

## 2016-04-10 DIAGNOSIS — K759 Inflammatory liver disease, unspecified: Secondary | ICD-10-CM

## 2016-04-10 DIAGNOSIS — R7989 Other specified abnormal findings of blood chemistry: Secondary | ICD-10-CM

## 2016-04-10 DIAGNOSIS — R945 Abnormal results of liver function studies: Principal | ICD-10-CM

## 2016-04-10 DIAGNOSIS — C5702 Malignant neoplasm of left fallopian tube: Secondary | ICD-10-CM

## 2016-04-11 ENCOUNTER — Other Ambulatory Visit (HOSPITAL_BASED_OUTPATIENT_CLINIC_OR_DEPARTMENT_OTHER): Payer: Medicare Other

## 2016-04-11 ENCOUNTER — Ambulatory Visit: Payer: Medicare Other

## 2016-04-11 ENCOUNTER — Telehealth: Payer: Self-pay | Admitting: *Deleted

## 2016-04-11 ENCOUNTER — Telehealth: Payer: Self-pay | Admitting: Oncology

## 2016-04-11 DIAGNOSIS — C801 Malignant (primary) neoplasm, unspecified: Secondary | ICD-10-CM | POA: Diagnosis not present

## 2016-04-11 DIAGNOSIS — R945 Abnormal results of liver function studies: Principal | ICD-10-CM

## 2016-04-11 DIAGNOSIS — C5702 Malignant neoplasm of left fallopian tube: Secondary | ICD-10-CM | POA: Diagnosis not present

## 2016-04-11 DIAGNOSIS — R748 Abnormal levels of other serum enzymes: Secondary | ICD-10-CM | POA: Diagnosis not present

## 2016-04-11 DIAGNOSIS — K759 Inflammatory liver disease, unspecified: Secondary | ICD-10-CM | POA: Diagnosis not present

## 2016-04-11 DIAGNOSIS — R7989 Other specified abnormal findings of blood chemistry: Secondary | ICD-10-CM

## 2016-04-11 LAB — COMPREHENSIVE METABOLIC PANEL
ALBUMIN: 3.4 g/dL — AB (ref 3.5–5.0)
ALK PHOS: 164 U/L — AB (ref 40–150)
ALT: 121 U/L — AB (ref 0–55)
ANION GAP: 8 meq/L (ref 3–11)
AST: 21 U/L (ref 5–34)
BUN: 11.2 mg/dL (ref 7.0–26.0)
CALCIUM: 9 mg/dL (ref 8.4–10.4)
CO2: 25 mEq/L (ref 22–29)
Chloride: 106 mEq/L (ref 98–109)
Creatinine: 0.7 mg/dL (ref 0.6–1.1)
EGFR: 87 mL/min/{1.73_m2} — ABNORMAL LOW (ref >90)
Glucose: 95 mg/dl (ref 70–140)
Potassium: 4.3 mEq/L (ref 3.5–5.1)
Sodium: 138 mEq/L (ref 136–145)
Total Bilirubin: <0.3 mg/dL (ref 0.20–1.20)
Total Protein: 6.6 g/dL (ref 6.4–8.3)

## 2016-04-11 NOTE — Telephone Encounter (Signed)
-----   Message from Gordy Levan, MD sent at 04/10/2016  9:22 AM EDT ----- Chemo held last week with elevated LFTs.  Please get stat CMET on 6-26 - order in, lab apt needs to be set up. I will check acute hepatitis panel also, order in. Need to let patient know results of CMET on 6-26; RN show to MD first please.  Tell patient NO TYLENOL. Document how much tylenol she has used in past few weeks (med sheet says 1000 mg q 6 hr prn). Tell patient NO ETOH. Document how much she usually drinks. HOLD zetia  thanks

## 2016-04-11 NOTE — Telephone Encounter (Signed)
Added lab, pt aware per pof

## 2016-04-11 NOTE — Telephone Encounter (Signed)
Pt will come in today at 11:30 for stat labs.  States she is NOT using Tylenol at all. Took 2 Advil last week. Does NOT drink ETOH. Has NOT been taking zetia.

## 2016-04-12 ENCOUNTER — Ambulatory Visit: Payer: Medicare Other

## 2016-04-12 LAB — HEPATITIS PANEL, ACUTE
HBsAg Screen: NEGATIVE
HEP B C IGM: NEGATIVE
Hep A Ab, IgM: NEGATIVE
Hep C Virus Ab: <0.1 s/co ratio (ref 0.0–0.9)

## 2016-04-14 ENCOUNTER — Other Ambulatory Visit (HOSPITAL_BASED_OUTPATIENT_CLINIC_OR_DEPARTMENT_OTHER): Payer: Medicare Other

## 2016-04-14 ENCOUNTER — Encounter: Payer: Self-pay | Admitting: Oncology

## 2016-04-14 ENCOUNTER — Ambulatory Visit (HOSPITAL_BASED_OUTPATIENT_CLINIC_OR_DEPARTMENT_OTHER): Payer: Medicare Other | Admitting: Oncology

## 2016-04-14 ENCOUNTER — Telehealth: Payer: Self-pay | Admitting: Oncology

## 2016-04-14 ENCOUNTER — Telehealth: Payer: Self-pay

## 2016-04-14 ENCOUNTER — Ambulatory Visit: Payer: Medicare Other

## 2016-04-14 VITALS — BP 128/54 | HR 66 | Temp 97.8°F | Resp 17 | Ht 64.0 in | Wt 139.9 lb

## 2016-04-14 DIAGNOSIS — C5702 Malignant neoplasm of left fallopian tube: Secondary | ICD-10-CM | POA: Diagnosis not present

## 2016-04-14 DIAGNOSIS — R7989 Other specified abnormal findings of blood chemistry: Secondary | ICD-10-CM

## 2016-04-14 DIAGNOSIS — T451X5A Adverse effect of antineoplastic and immunosuppressive drugs, initial encounter: Secondary | ICD-10-CM

## 2016-04-14 DIAGNOSIS — R945 Abnormal results of liver function studies: Secondary | ICD-10-CM

## 2016-04-14 DIAGNOSIS — Z85820 Personal history of malignant melanoma of skin: Secondary | ICD-10-CM | POA: Diagnosis not present

## 2016-04-14 DIAGNOSIS — Z95828 Presence of other vascular implants and grafts: Secondary | ICD-10-CM | POA: Diagnosis not present

## 2016-04-14 DIAGNOSIS — D701 Agranulocytosis secondary to cancer chemotherapy: Secondary | ICD-10-CM | POA: Diagnosis not present

## 2016-04-14 LAB — CBC WITH DIFFERENTIAL/PLATELET
BASO%: 0.3 % (ref 0.0–2.0)
BASOS ABS: 0 10*3/uL (ref 0.0–0.1)
EOS ABS: 0.1 10*3/uL (ref 0.0–0.5)
EOS%: 3.1 % (ref 0.0–7.0)
HEMATOCRIT: 37.5 % (ref 34.8–46.6)
HGB: 12.2 g/dL (ref 11.6–15.9)
LYMPH%: 24.4 % (ref 14.0–49.7)
MCH: 29.3 pg (ref 25.1–34.0)
MCHC: 32.5 g/dL (ref 31.5–36.0)
MCV: 89.9 fL (ref 79.5–101.0)
MONO#: 0.3 10*3/uL (ref 0.1–0.9)
MONO%: 9.6 % (ref 0.0–14.0)
NEUT#: 2.2 10*3/uL (ref 1.5–6.5)
NEUT%: 62.6 % (ref 38.4–76.8)
PLATELETS: 219 10*3/uL (ref 145–400)
RBC: 4.17 10*6/uL (ref 3.70–5.45)
RDW: 13.6 % (ref 11.2–14.5)
WBC: 3.6 10*3/uL — ABNORMAL LOW (ref 3.9–10.3)
lymph#: 0.9 10*3/uL (ref 0.9–3.3)

## 2016-04-14 LAB — COMPREHENSIVE METABOLIC PANEL
ALK PHOS: 170 U/L — AB (ref 40–150)
ALT: 72 U/L — ABNORMAL HIGH (ref 0–55)
AST: 21 U/L (ref 5–34)
Albumin: 3.5 g/dL (ref 3.5–5.0)
Anion Gap: 8 mEq/L (ref 3–11)
BILIRUBIN TOTAL: 0.52 mg/dL (ref 0.20–1.20)
BUN: 7.6 mg/dL (ref 7.0–26.0)
CALCIUM: 9.3 mg/dL (ref 8.4–10.4)
CO2: 26 mEq/L (ref 22–29)
Chloride: 105 mEq/L (ref 98–109)
Creatinine: 0.7 mg/dL (ref 0.6–1.1)
EGFR: 84 mL/min/{1.73_m2} — ABNORMAL LOW (ref >90)
GLUCOSE: 124 mg/dL (ref 70–140)
POTASSIUM: 4.2 meq/L (ref 3.5–5.1)
SODIUM: 139 meq/L (ref 136–145)
TOTAL PROTEIN: 6.9 g/dL (ref 6.4–8.3)

## 2016-04-14 NOTE — Progress Notes (Signed)
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 She was neutropenic with ANC 0.2 on day 14 cycle 1.  LFTs, which had been slightly elevated prior to chemo on 03-18-16, had marked but asymptomatic increase by 04-08-16, with cycle 2 held that day. Repeat CMET 6-26 was some better and acute hepatitis panel negative.  03-09-16:  Tbili 0.37, AP 85, AST 19, ALT 34 03-18-16 (drawn prior to first chemo that day) Tbili 0.32, AP 106, AST 38, ALT 99 03-24-16 T bili 0.35, AP 82, AST 25, ALT 57 03-31-16 Tbili <0.3, AP 97, AST 37, ALT 68 04-08-16 (#2 chemo due but held) Tbili 0.3, AP 241, AST 73, ALT 328 04-11-16  Tbili <0.3, AP 164, AST 21, ALT 121 Acute hepatitis panel negative 04-11-16  Patient took frequent tylenol after surgery 02-18-16, but denies any tylenol now in several weeks. She held zetia due to muscle aches also at least several weeks. She denies any ETOH.   Patient has felt well overall in last week. She denies nausea, abdominal discomfort or pain, change in bowels, SOB, fever, LE swelling, any bleeding. Appetite has been better and she is pleased with the dietary changes that she has made. No problems with PAC. No peripheral neuropathy Remainder of 10 point Review of Systems negative.  PAC by IR 03-15-16 CA 125 preoperatively 656 Genetics counseling scheduled 04-25-16.   ONCOLOGIC HISTORY Patient had history of benign right ovarian cyst and uterine fibroids, for which she saw Dr Josephina Shih in 08-2014, CA 125 slightly elevated then which was felt due to fibroids. She developed mild RUQ discomfort 08-2015, which she noticed mostly when taking a deep breath. She had CT AP by Dr Joylene Draft  01-27-16 which showed carcinomatosis, with right adnexal lesion 2.8 x 2.7 x 3.5 cm without solid component, unremarkable left ovary, no ascites, no abdominal or pelvic adenopathy. CA 125 was 656 on 02-08-16. 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 CXR 02-16-16 small amount of right pleural thickening or fluid and linear atelectasis or scarring left lung base.She had exploratory laparotomy with TAH, BSO, omentectomy and radical debulking by Dr Denman George on 02-18-16, intraoperative findings remarkable for 10 cm omental cake, milial studding 1 mm pelvic surfaces, right colic gutter, right diaphragm and liver, mesentery of transverse colon and rectum, 3 cm cystic mass in right ovary. Procedure included argon beam coagulation of miliary tumor implants, with no residual disease apparent at completion. Surgical pathology [phone]) high grade serous carcinoma of left fallopian tube involving omentum, with benign serous right ovarian cyst 3.5 cm. Post operatively she has slow recovery of bowel function, but was stable for DC on 02-22-16. Case was presented at gyn oncology multidisciplinary conference on 03-07-16, with recommendation for 6 cycles of adjuvant carboplatin taxol. Cycle 1 carboplatin taxol was given 03-18-16, neutropenic with ANC 0.2 on day 14 cycle 1. Cycle 2 was delayed with elevated AP, AST, ALT of unclear etiology,  given as carboplatin only on 04-15-16 also due to the LFT changes.     Objective:  Vital signs in last 24 hours: Weight down 3 lba  to 139, 124/54, 66 regular, 17 not labored, 97.8, 100% RA  Alert, oriented and appropriate. Ambulatory without difficulty. Respirations not labored. Looks comfortable   HEENT:PERRL, sclerae not icteric. Oral mucosa moist without lesions, posterior pharynx clear.  Neck supple. No JVD.  Lymphatics:no  cervical,supraclavicular,  or inguinal adenopathy Resp: clear to auscultation bilaterally and normal percussion bilaterally Cardio: regular rate and rhythm. No gallop. GI: soft, nontender, not distended, no mass or organomegaly. Normally active bowel sounds. Surgical incision appears well healed Musculoskeletal/ Extremities: without pitting edema, cords, tenderness Neuro: no peripheral neuropathy. Otherwise nonfocal. PSYCH appropriate mood and affect Skin without rash, ecchymosis, petechiae Portacath-without erythema or tenderness  Lab Results:  Results for orders placed or performed in visit on 04/14/16  CBC with Differential  Result Value Ref Range   WBC 3.6 (L) 3.9 - 10.3 10e3/uL   NEUT# 2.2 1.5 - 6.5 10e3/uL   HGB 12.2 11.6 - 15.9 g/dL   HCT 37.5 34.8 - 46.6 %   Platelets 219 145 - 400 10e3/uL   MCV 89.9 79.5 - 101.0 fL   MCH 29.3 25.1 - 34.0 pg   MCHC 32.5 31.5 - 36.0 g/dL   RBC 4.17 3.70 - 5.45 10e6/uL   RDW 13.6 11.2 - 14.5 %   lymph# 0.9 0.9 - 3.3 10e3/uL   MONO# 0.3 0.1 - 0.9 10e3/uL   Eosinophils Absolute 0.1 0.0 - 0.5 10e3/uL   Basophils Absolute 0.0 0.0 - 0.1 10e3/uL   NEUT% 62.6 38.4 - 76.8 %   LYMPH% 24.4 14.0 - 49.7 %   MONO% 9.6 0.0 - 14.0 %   EOS% 3.1 0.0 - 7.0 %   BASO% 0.3 0.0 - 2.0 %  Comprehensive metabolic panel  Result Value Ref Range   Sodium 139 136 - 145 mEq/L   Potassium 4.2 3.5 - 5.1 mEq/L   Chloride 105 98 - 109 mEq/L   CO2 26 22 - 29 mEq/L   Glucose 124 70 - 140 mg/dl   BUN 7.6 7.0 - 26.0 mg/dL   Creatinine 0.7 0.6 - 1.1 mg/dL   Total Bilirubin 0.52 0.20 - 1.20 mg/dL   Alkaline Phosphatase 170 (H) 40 - 150 U/L   AST 21 5 - 34 U/L   ALT 72 (H) 0 - 55 U/L   Total Protein 6.9 6.4 - 8.3 g/dL   Albumin 3.5 3.5 - 5.0 g/dL   Calcium 9.3 8.4 - 10.4 mg/dL   Anion Gap 8 3 - 11 mEq/L   EGFR 84 (L) >90 ml/min/1.73 m2  CA 125  Result Value Ref Range   Cancer Antigen (CA) 125 36.7 0.0 - 38.1 U/mL   CA 125 available after visit.  All chemistries  from 03-09-16 to present reviewed with patient at time of visit.  Studies/Results:  CT AP 01-27-16 no evidence of gallstones or other gallbladder abnormality No obvious hepatic concerns  Medications: I have reviewed the patient's current medications.  Zetia and tylenol as above. I do not Carbo and taxol would not cause elevation in transaminases, tho taxol needs adjustment depending on levels. I see lovastatin listed in EMR, tho I am not sure that this is a current medication, may need to be held if so  Patient understands that she is not to take premedication decadron tonight, as she will not have taxol on 04-15-16. She will have granix as planned.  DISCUSSION Patient does not appear ill, however etiology of the elevated AP/ AST/ALT not clear to me and problem has not fully resolved.  She is not presently known to any GI physicians, however I have spoken with Dr Perini's office in hopes that he can see her and review this. In meantime will give single agent carboplatin on 04-15-16, as this has minimal hepatic metabolism and will allow [place] to continue treatment without much more delay.   Assessment/Plan:  1. 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 Neutropenic day 14 cycle 1. Cycle 2 delayed from 04-08-16 with elevated LFTs, will give as [place] only on 04-15-16.  Genetics counseling scheduled, fallopian ca and also multiple melanomas. 2. Elevated AP, AST, ALT somewhat variable and preceded start of chemo, but have delayed cycle 2 and etiology not clear to me. WIll give [place] only on 04-15-16 as discussed above. Appreciate Dr Perini's help.  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 Follows every 6 months with dermatologist Dr Linus Galas. Punch biopsy of scalp healing well, will need MOHs for basal cell there,  timing as above.Marland Kitchen  4.benign right ovarian cyst: evaluated 08-2014 at which time there was no evidence of malignancy, and confirmed by pathology from this surgery 5.elevated cholesterol followed by Dr Joylene Draft 6.diverticulosis by colonoscopy, no apparent symptoms 7.degenerative arthritis hands and knees, mild 8.history of migraine HAs, not exacerbated by zofran  9.social stress with death of grandchild by suicide a year ago  31.advance directives in place    All questions answered and patient is in agreement with recommendations and plans. Dr Perini's office to let us know about appointment there. This note and cumulative labs to Dr Joylene Draft. Chemo orders adjusted, granix orders placed. Time spent 30 min including >50% counseling and coordination of care.  Nadina Fomby P, MD   04/16/2016, 9:20 PM

## 2016-04-14 NOTE — Telephone Encounter (Signed)
Varney Biles from [facility] to let us know she is calling the pt to set up an appt to see the pt. She is requesting labs be faxed over. Labs faxed to [phone] Bari Edward.

## 2016-04-14 NOTE — Telephone Encounter (Signed)
appt made and avs printed °

## 2016-04-15 ENCOUNTER — Ambulatory Visit (HOSPITAL_BASED_OUTPATIENT_CLINIC_OR_DEPARTMENT_OTHER): Payer: Medicare Other

## 2016-04-15 ENCOUNTER — Telehealth: Payer: Self-pay | Admitting: *Deleted

## 2016-04-15 ENCOUNTER — Telehealth: Payer: Self-pay

## 2016-04-15 VITALS — BP 122/57 | HR 84 | Temp 98.2°F | Resp 18

## 2016-04-15 DIAGNOSIS — Z5111 Encounter for antineoplastic chemotherapy: Secondary | ICD-10-CM | POA: Diagnosis not present

## 2016-04-15 DIAGNOSIS — C5702 Malignant neoplasm of left fallopian tube: Secondary | ICD-10-CM | POA: Diagnosis not present

## 2016-04-15 LAB — CA 125: CANCER ANTIGEN (CA) 125: 36.7 U/mL (ref 0.0–38.1)

## 2016-04-15 MED ORDER — SODIUM CHLORIDE 0.9 % IV SOLN: Freq: Once | INTRAVENOUS | Status: DC

## 2016-04-15 MED ORDER — SODIUM CHLORIDE 0.9 % IV SOLN
378.5000 mg | Freq: Once | INTRAVENOUS | Status: AC
  Administered 2016-04-15: 380 mg via INTRAVENOUS
  Filled 2016-04-15: qty 38

## 2016-04-15 MED ORDER — HEPARIN SOD (PORK) LOCK FLUSH 100 UNIT/ML IV SOLN
500.0000 [IU] | Freq: Once | INTRAVENOUS | Status: AC | PRN
  Administered 2016-04-15: 500 [IU]
  Filled 2016-04-15: qty 5

## 2016-04-15 MED ORDER — SODIUM CHLORIDE 0.9% FLUSH
10.0000 mL | INTRAVENOUS | Status: DC | PRN
Start: 2016-04-15 — End: 2016-04-15
  Administered 2016-04-15: 10 mL
  Filled 2016-04-15: qty 10

## 2016-04-15 MED ORDER — SODIUM CHLORIDE 0.9 % IV SOLN: 10.0000 mg | Freq: Once | INTRAVENOUS | Status: DC

## 2016-04-15 MED ORDER — SODIUM CHLORIDE 0.9 % IV SOLN
Freq: Once | INTRAVENOUS | Status: AC
  Administered 2016-04-15: 15:00:00 via INTRAVENOUS
  Filled 2016-04-15: qty 4

## 2016-04-15 MED ORDER — SODIUM CHLORIDE 0.9 % IV SOLN
Freq: Once | INTRAVENOUS | Status: AC
  Administered 2016-04-15: 14:00:00 via INTRAVENOUS

## 2016-04-15 NOTE — Telephone Encounter (Signed)
TC from patient asking about her treatment today @ 2pm. She is asking if she needed a separate appt to have her port accessed prior to her treatment. Informed her that the nurse assigned to her in the infusion room would access her port, no need for separate appt.  Pt also requesting a prescription/voucher for a wig.  Spoke with Juliann Pulse, RN with Dr. Marko Plume. She will get one to patient while she is in infusion.

## 2016-04-15 NOTE — Telephone Encounter (Signed)
Pt called requesting a wig rx, prepared for pt. She will pick up in infusion today.

## 2016-04-15 NOTE — Patient Instructions (Signed)
[facility] Discharge Instructions for Patients Receiving Chemotherapy  Today you received the following chemotherapy agents: Carboplatin   To help prevent nausea and vomiting after your treatment, we encourage you to take your nausea medication as directed.    If you develop nausea and vomiting that is not controlled by your nausea medication, call the clinic.   BELOW ARE SYMPTOMS THAT SHOULD BE REPORTED IMMEDIATELY:  *FEVER GREATER THAN 100.5 F  *CHILLS WITH OR WITHOUT FEVER  NAUSEA AND VOMITING THAT IS NOT CONTROLLED WITH YOUR NAUSEA MEDICATION  *UNUSUAL SHORTNESS OF BREATH  *UNUSUAL BRUISING OR BLEEDING  TENDERNESS IN MOUTH AND THROAT WITH OR WITHOUT PRESENCE OF ULCERS  *URINARY PROBLEMS  *BOWEL PROBLEMS  UNUSUAL RASH Items with * indicate a potential emergency and should be followed up as soon as possible.  Feel free to call the clinic you have any questions or concerns. The clinic phone number is [phone].  Please show the CHEMO ALERT CARD at check-in to the Emergency Department and triage nurse.   

## 2016-04-16 ENCOUNTER — Ambulatory Visit (HOSPITAL_BASED_OUTPATIENT_CLINIC_OR_DEPARTMENT_OTHER): Payer: Medicare Other

## 2016-04-16 VITALS — BP 102/67 | HR 76 | Temp 98.7°F | Resp 18

## 2016-04-16 DIAGNOSIS — Z95828 Presence of other vascular implants and grafts: Secondary | ICD-10-CM

## 2016-04-16 DIAGNOSIS — R7989 Other specified abnormal findings of blood chemistry: Secondary | ICD-10-CM | POA: Insufficient documentation

## 2016-04-16 DIAGNOSIS — D701 Agranulocytosis secondary to cancer chemotherapy: Secondary | ICD-10-CM | POA: Diagnosis not present

## 2016-04-16 DIAGNOSIS — R945 Abnormal results of liver function studies: Secondary | ICD-10-CM

## 2016-04-16 DIAGNOSIS — C5702 Malignant neoplasm of left fallopian tube: Secondary | ICD-10-CM

## 2016-04-16 MED ORDER — TBO-FILGRASTIM 300 MCG/0.5ML ~~LOC~~ SOSY
300.0000 ug | PREFILLED_SYRINGE | Freq: Once | SUBCUTANEOUS | Status: AC
  Administered 2016-04-16: 300 ug via SUBCUTANEOUS

## 2016-04-16 MED ORDER — ALTEPLASE 2 MG IJ SOLR
2.0000 mg | Freq: Once | INTRAMUSCULAR | Status: DC | PRN
  Filled 2016-04-16: qty 2

## 2016-04-18 ENCOUNTER — Ambulatory Visit (HOSPITAL_BASED_OUTPATIENT_CLINIC_OR_DEPARTMENT_OTHER): Payer: Medicare Other

## 2016-04-18 VITALS — BP 105/72 | HR 77 | Temp 98.3°F | Resp 17

## 2016-04-18 DIAGNOSIS — C5700 Malignant neoplasm of unspecified fallopian tube: Secondary | ICD-10-CM | POA: Diagnosis not present

## 2016-04-18 DIAGNOSIS — D701 Agranulocytosis secondary to cancer chemotherapy: Secondary | ICD-10-CM | POA: Diagnosis present

## 2016-04-18 DIAGNOSIS — Z6824 Body mass index (BMI) 24.0-24.9, adult: Secondary | ICD-10-CM | POA: Diagnosis not present

## 2016-04-18 DIAGNOSIS — C5702 Malignant neoplasm of left fallopian tube: Secondary | ICD-10-CM

## 2016-04-18 DIAGNOSIS — Z95828 Presence of other vascular implants and grafts: Secondary | ICD-10-CM

## 2016-04-18 DIAGNOSIS — R945 Abnormal results of liver function studies: Secondary | ICD-10-CM | POA: Diagnosis not present

## 2016-04-18 MED ORDER — TBO-FILGRASTIM 300 MCG/0.5ML ~~LOC~~ SOSY
300.0000 ug | PREFILLED_SYRINGE | Freq: Once | SUBCUTANEOUS | Status: AC
  Administered 2016-04-18: 300 ug via SUBCUTANEOUS
  Filled 2016-04-18: qty 0.5

## 2016-04-18 NOTE — Patient Instructions (Signed)

## 2016-04-25 ENCOUNTER — Ambulatory Visit (HOSPITAL_BASED_OUTPATIENT_CLINIC_OR_DEPARTMENT_OTHER): Payer: Medicare Other | Admitting: Genetic Counselor

## 2016-04-25 ENCOUNTER — Other Ambulatory Visit: Payer: Medicare Other

## 2016-04-25 ENCOUNTER — Encounter: Payer: Self-pay | Admitting: Genetic Counselor

## 2016-04-25 DIAGNOSIS — Z315 Encounter for genetic counseling: Secondary | ICD-10-CM

## 2016-04-25 DIAGNOSIS — Z85820 Personal history of malignant melanoma of skin: Secondary | ICD-10-CM

## 2016-04-25 DIAGNOSIS — C5702 Malignant neoplasm of left fallopian tube: Secondary | ICD-10-CM

## 2016-04-25 DIAGNOSIS — Z808 Family history of malignant neoplasm of other organs or systems: Secondary | ICD-10-CM | POA: Diagnosis not present

## 2016-04-25 DIAGNOSIS — Z8543 Personal history of malignant neoplasm of ovary: Secondary | ICD-10-CM | POA: Diagnosis not present

## 2016-04-25 DIAGNOSIS — C439 Malignant melanoma of skin, unspecified: Secondary | ICD-10-CM | POA: Insufficient documentation

## 2016-04-25 NOTE — Progress Notes (Signed)
REFERRING PROVIDER: [name], MD [place], [place] [address]   Evlyn Clines, MD  PRIMARY PROVIDER:  Jerlyn Ly, MD  PRIMARY REASON FOR VISIT:  1. Fallopian tube cancer, carcinoma, left ([place])   2. History of melanoma      HISTORY OF PRESENT ILLNESS:   Ms. Gass, a 75 y.o. female, was seen for a [place] cancer genetics consultation at the request of Dr. Marko Plume due to a personal history of cancer.  Ms. Espe presents to clinic today to discuss the possibility of a hereditary predisposition to cancer, genetic testing, and to further clarify her future cancer risks, as well as potential cancer risks for family members.   In 2017, at the age of 70, Ms. Satre was diagnosed with cancer of the left fallopian tube. This was treated with surgery and chemotherapy.  The patient also has a 15 year history of melanoma.  Her first melanoma was around age [age].  A melanoma in situ was found on her back, calf and arm. She had a stage 1 melanoma on her shoulder and another in situ another place. 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     HORMONAL RISK FACTORS:  Menarche was at age 30.  First live birth at age 38.  OCP use for approximately 0 years.  Ovaries intact: no.  Hysterectomy: yes.  Menopausal status: postmenopausal.  HRT use: 0 years. Colonoscopy: yes; normal. Mammogram within the last year: yes. Number of breast biopsies: 0. Up to date with pelvic exams:  yes. Any excessive radiation exposure in the past:  no  Past Medical History  Diagnosis Date  . High cholesterol   . Headache     past hx.-no in awhile.  . Arthritis     arthritis -hands-knees.  . Cancer ([place])     MELANOMA. multiple x5 different sites. last 10 yrs ago.  . Melanoma ([place])   . Skin cancer     melanoma    Past Surgical History  Procedure Laterality Date  . Tonsillectomy and adenoidectomy  1948  . Hysteroscopy    . Omental mass      Biopsy, CT  guided  . Laparotomy N/A 02/18/2016    Procedure: EXPLORATORY LAPAROTOMY;  Surgeon: Everitt Amber, MD;  Location: [facility];  Service: Gynecology;  Laterality: N/A;  . Omentectomy N/A 02/18/2016    Procedure: OMENTECTOMY;  Surgeon: Everitt Amber, MD;  Location: [facility];  Service: Gynecology;  Laterality: N/A;  . Debulking N/A 02/18/2016    Procedure: RADICAL TUMOR DEBULKING;  Surgeon: Everitt Amber, MD;  Location: [facility];  Service: Gynecology;  Laterality: N/A;  . Abdominal hysterectomy N/A 02/18/2016    Procedure: HYSTERECTOMY ABDOMINAL;  Surgeon: Everitt Amber, MD;  Location: [facility];  Service: Gynecology;  Laterality: N/A;  . Salpingoophorectomy Bilateral 02/18/2016    Procedure: BILATERAL SALPINGO OOPHORECTOMY;  Surgeon: Everitt Amber, MD;  Location: [facility];  Service: Gynecology;  Laterality: Bilateral;    Social History   Social History  . Marital Status: Married    Spouse Name: N/A  . Number of Children: 2  . Years of Education: N/A   Social History Main Topics  . Smoking status: Never Smoker   . Smokeless tobacco: Never Used  . Alcohol Use: 0.0 oz/week    0 Standard drinks or equivalent per week     Comment: wine 1 daily  . Drug Use: No  . Sexual Activity: No  Other Topics Concern  . None   Social History Narrative     FAMILY HISTORY:  We obtained a detailed, 4-generation family history.  Significant diagnoses are listed below: Family History  Problem Relation Age of Onset  . Colitis Sister   . Parkinsonism Maternal Grandmother   . Heart disease Paternal Grandmother   . Heart disease Paternal Grandfather   . Cancer Cousin 12    paternal first cousin  . Skin cancer Son     The patient has two sons who are healthy.  One has had skin cancer.  She has an older sister who is healthy and a younger brother who died at 52.  Her mother died in childbirth at age 28.  Her mother had one brother who was healthy, and she never kept in contact with his children.  Her maternal grandparents died in their  51s from non cancer related issues.  The patient's father died at 30.  He had one sister who lived into her 39s.  She had one son who died of an unknown cancer at 46.  The patient's paternal grandparents died in their 58s from heart disease.  Patient's maternal ancestors are of unknown  descent, and paternal ancestors are of [place] descent. There is no reported Ashkenazi Jewish ancestry. There is no known consanguinity.  GENETIC COUNSELING ASSESSMENT: VANEZA PICKART is a 75 y.o. female with a personal history of melanoma and fallopian tube cancer which is somewhat suggestive of a hereditary cancer syndrome and predisposition to cancer. We, therefore, discussed and recommended the following at today's visit.   DISCUSSION: We discussed that about 15-20% of fallopian tube/ovarian cancer is associated with hereditary mutations, most commonly in BRCA but also in Lynch syndrome.  About 30% of melanoma can be contributed to genetic causes. Most commonly due to CDKN2A, but also others including BAP1, MITF and others.  Some cases of melanoma are also part of the BRCA spectrum.   We reviewed the characteristics, features and inheritance patterns of hereditary cancer syndromes. We also discussed genetic testing, including the appropriate family members to test, the process of testing, insurance coverage and turn-around-time for results. We discussed the implications of a negative, positive and/or variant of uncertain significant result. We recommended Ms. Tonkinson pursue genetic testing for the custom panel involving genes from the Breast/ovarian cancer gene panel along with other melanoma genes..   Based on Ms. Dolman's personal history of cancer, she meets medical criteria for genetic testing. Despite that she meets criteria, she may still have an out of pocket cost. We discussed that if her out of pocket cost for testing is over $100, the laboratory will call and confirm whether she wants to proceed with testing.  If  the out of pocket cost of testing is less than $100 she will be billed by the genetic testing laboratory.   PLAN: After considering the risks, benefits, and limitations, Ms. Adel  provided informed consent to pursue genetic testing and the blood sample was sent to [facility] for analysis of the custom panel. Results should be available within approximately 2-3 weeks' time, at which point they will be disclosed by telephone to Ms. Teall, as will any additional recommendations warranted by these results. Ms. Veracruz will receive a summary of her genetic counseling visit and a copy of her results once available. This information will also be available in Epic. We encouraged Ms. Scaffidi to remain in contact with cancer genetics annually so that we can continuously update the family  history and inform her of any changes in cancer genetics and testing that may be of benefit for her family. Ms. Jean questions were answered to her satisfaction today. Our contact information was provided should additional questions or concerns arise.  Lastly, we encouraged Ms. Wayment to remain in contact with cancer genetics annually so that we can continuously update the family history and inform her of any changes in cancer genetics and testing that may be of benefit for this family.   Ms.  Brienza questions were answered to her satisfaction today. Our contact information was provided should additional questions or concerns arise. Thank you for the referral and allowing [place] to share in the care of your patient.   Karen P. Florene Glen, [place], [facility] Certified Genetic Counselor [email or web address] phone: [phone]  The patient was seen for a total of 60 minutes in face-to-face genetic counseling.  This patient was discussed with Drs. Magrinat, Lindi Adie and/or Burr Medico who agrees with the above.    _______________________________________________________________________ For Office Staff:  Number of people involved in  session: 1 Was an Intern/ student involved with case: no

## 2016-04-27 ENCOUNTER — Other Ambulatory Visit: Payer: Self-pay | Admitting: Oncology

## 2016-04-27 DIAGNOSIS — C5702 Malignant neoplasm of left fallopian tube: Secondary | ICD-10-CM

## 2016-04-28 ENCOUNTER — Encounter: Payer: Self-pay | Admitting: Oncology

## 2016-04-28 ENCOUNTER — Telehealth: Payer: Self-pay

## 2016-04-28 ENCOUNTER — Other Ambulatory Visit (HOSPITAL_BASED_OUTPATIENT_CLINIC_OR_DEPARTMENT_OTHER): Payer: Medicare Other

## 2016-04-28 ENCOUNTER — Ambulatory Visit (HOSPITAL_BASED_OUTPATIENT_CLINIC_OR_DEPARTMENT_OTHER): Payer: Medicare Other | Admitting: Oncology

## 2016-04-28 ENCOUNTER — Other Ambulatory Visit: Payer: Self-pay | Admitting: Oncology

## 2016-04-28 VITALS — BP 116/55 | HR 76 | Temp 98.1°F | Resp 18 | Ht 64.0 in | Wt 141.2 lb

## 2016-04-28 DIAGNOSIS — Z85820 Personal history of malignant melanoma of skin: Secondary | ICD-10-CM

## 2016-04-28 DIAGNOSIS — R7989 Other specified abnormal findings of blood chemistry: Secondary | ICD-10-CM | POA: Diagnosis not present

## 2016-04-28 DIAGNOSIS — C5702 Malignant neoplasm of left fallopian tube: Secondary | ICD-10-CM

## 2016-04-28 DIAGNOSIS — Z95828 Presence of other vascular implants and grafts: Secondary | ICD-10-CM

## 2016-04-28 DIAGNOSIS — D701 Agranulocytosis secondary to cancer chemotherapy: Secondary | ICD-10-CM | POA: Diagnosis not present

## 2016-04-28 DIAGNOSIS — R945 Abnormal results of liver function studies: Secondary | ICD-10-CM

## 2016-04-28 DIAGNOSIS — T451X5A Adverse effect of antineoplastic and immunosuppressive drugs, initial encounter: Secondary | ICD-10-CM

## 2016-04-28 LAB — CBC WITH DIFFERENTIAL/PLATELET
BASO%: 0.3 % (ref 0.0–2.0)
Basophils Absolute: 0 10*3/uL (ref 0.0–0.1)
EOS%: 2.2 % (ref 0.0–7.0)
Eosinophils Absolute: 0.1 10*3/uL (ref 0.0–0.5)
HCT: 34.3 % — ABNORMAL LOW (ref 34.8–46.6)
HGB: 11.4 g/dL — ABNORMAL LOW (ref 11.6–15.9)
LYMPH%: 29.7 % (ref 14.0–49.7)
MCH: 29.5 pg (ref 25.1–34.0)
MCHC: 33.2 g/dL (ref 31.5–36.0)
MCV: 88.9 fL (ref 79.5–101.0)
MONO#: 0.2 10*3/uL (ref 0.1–0.9)
MONO%: 7 % (ref 0.0–14.0)
NEUT%: 60.8 % (ref 38.4–76.8)
NEUTROS ABS: 1.9 10*3/uL (ref 1.5–6.5)
PLATELETS: 206 10*3/uL (ref 145–400)
RBC: 3.86 10*6/uL (ref 3.70–5.45)
RDW: 14 % (ref 11.2–14.5)
WBC: 3.2 10*3/uL — ABNORMAL LOW (ref 3.9–10.3)
lymph#: 0.9 10*3/uL (ref 0.9–3.3)

## 2016-04-28 LAB — COMPREHENSIVE METABOLIC PANEL
ALT: 26 U/L (ref 0–55)
ANION GAP: 10 meq/L (ref 3–11)
AST: 16 U/L (ref 5–34)
Albumin: 3.6 g/dL (ref 3.5–5.0)
Alkaline Phosphatase: 107 U/L (ref 40–150)
BUN: 6.8 mg/dL — AB (ref 7.0–26.0)
CO2: 25 meq/L (ref 22–29)
CREATININE: 0.7 mg/dL (ref 0.6–1.1)
Calcium: 9.1 mg/dL (ref 8.4–10.4)
Chloride: 104 mEq/L (ref 98–109)
EGFR: 84 mL/min/{1.73_m2} — ABNORMAL LOW (ref >90)
GLUCOSE: 111 mg/dL (ref 70–140)
Potassium: 4 mEq/L (ref 3.5–5.1)
SODIUM: 138 meq/L (ref 136–145)
TOTAL PROTEIN: 6.6 g/dL (ref 6.4–8.3)

## 2016-04-28 MED ORDER — PROCHLORPERAZINE MALEATE 10 MG PO TABS: 10.0000 mg | ORAL_TABLET | Freq: Four times a day (QID) | ORAL | Status: DC | PRN

## 2016-04-28 NOTE — Progress Notes (Signed)
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 Because of elevation in LFTs of unclear etiology, cycle 2 on 04-15-16 was carboplatin only.  Dr Perini saw her on 04-18-16 re the elevated liver chemistries, which preceded start of chemo but increased after cycle 1 carbo taxol. Evaluation to date has been negative and patient is asymptomatic; Dr Perini suggests zofran may be etiology. Note she did have zofran in hospital 5-4 thru 02-22-16.  Patient has felt well since most recent chemotherapy, tho is still restricting any lifting since the gyn oncology surgery. Energy is good and appetite has improved, no nausea, bowels ok, no fever or symptoms of infection, no abdominal pain, no respiratory symptoms, no bleeding, no LE swelling. No problems with PAC. No peripheral neuropathy. Remainder of 10 point Review of Systems negative.    PAC by IR 03-15-16 CA 125 preoperatively 656 Genetics counseling testing sent 04-25-16, custom panel to [facility] (melanoma and fallopian ca)   ONCOLOGIC HISTORY Patient had history of benign right ovarian cyst and uterine fibroids, for which she saw Dr ClarkePearson in 08-2014, CA 125 slightly elevated then which was felt due to fibroids. She developed mild RUQ discomfort 08-2015, which she noticed mostly when taking a deep breath. She had CT AP by Dr Perini 01-27-16 which showed carcinomatosis, with right adnexal lesion 2.8 x 2.7 x 3.5 cm without solid component, unremarkable left ovary, no ascites, no abdominal or pelvic adenopathy. CA 125 was 656 on 02-08-16. She had consultation with Dr Rossi on 02-08-16, then CT guided core needle biopsy of omental mass on 02-15-16, with pathology ([id number]) papillary  serous neoplasm which appeared low grade. CXR 02-16-16 small amount of right pleural thickening or fluid and linear atelectasis or scarring left lung base.She had exploratory laparotomy with TAH, BSO, omentectomy and radical debulking by Dr Rossi on 02-18-16, intraoperative findings remarkable for 10 cm omental cake, milial studding 1 mm pelvic surfaces, right colic gutter, right diaphragm and liver, mesentery of transverse colon and rectum, 3 cm cystic mass in right ovary. Procedure included argon beam coagulation of miliary tumor implants, with no residual disease apparent at completion. Surgical pathology ([id number]) high grade serous carcinoma of left fallopian tube involving omentum, with benign serous right ovarian cyst 3.5 cm. Post operatively she has slow recovery of bowel function, but was stable for DC on 02-22-16. Case was presented at gyn oncology multidisciplinary conference on 03-07-16, with recommendation for 6 cycles of adjuvant carboplatin taxol. Cycle 1 carboplatin taxol was given 03-18-16, neutropenic with ANC 0.2 on day 14 cycle 1. Cycle 2 was delayed with elevated AP, AST, ALT of unclear etiology, given as carboplatin only on 04-15-16 also due to the LFT changes.    Objective:  Vital signs in last 24 hours:  BP 116/55 mmHg  Pulse 76  Temp(Src) 98.1 F (36.7 C) (Oral)  Resp 18  Ht 5' 4" (1.626 m)  Wt 141 lb 3.2 oz (64.048 kg)  BMI 24.23 kg/m2  SpO2 100% Weight up 2 lbs. Alert, oriented and appropriate. Ambulatory without difficulty.  Alopecia  HEENT:PERRL, sclerae not icteric. Oral mucosa moist without lesions, posterior pharynx clear.  Neck supple. No JVD.  Lymphatics:no cervical,supraclavicular or inguinal adenopathy Resp: clear to auscultation bilaterally and normal percussion bilaterally Cardio:   regular rate and rhythm. No gallop. GI: soft, nontender, not distended, no mass or organomegaly. Normally active bowel sounds. Surgical incision not remarkable. Musculoskeletal/  Extremities: without pitting edema, cords, tenderness Neuro: no peripheral neuropathy. Otherwise nonfocal. PSYCH appropriate mood and affect Skin without rash, ecchymosis, petechiae. Not icteric Portacath-without erythema or tenderness. Suture at lateral end of scar, not irritated  Lab Results:  Results for orders placed or performed in visit on 04/28/16  CBC with Differential  Result Value Ref Range   WBC 3.2 (L) 3.9 - 10.3 10e3/uL   NEUT# 1.9 1.5 - 6.5 10e3/uL   HGB 11.4 (L) 11.6 - 15.9 g/dL   HCT 34.3 (L) 34.8 - 46.6 %   Platelets 206 145 - 400 10e3/uL   MCV 88.9 79.5 - 101.0 fL   MCH 29.5 25.1 - 34.0 pg   MCHC 33.2 31.5 - 36.0 g/dL   RBC 3.86 3.70 - 5.45 10e6/uL   RDW 14.0 11.2 - 14.5 %   lymph# 0.9 0.9 - 3.3 10e3/uL   MONO# 0.2 0.1 - 0.9 10e3/uL   Eosinophils Absolute 0.1 0.0 - 0.5 10e3/uL   Basophils Absolute 0.0 0.0 - 0.1 10e3/uL   NEUT% 60.8 38.4 - 76.8 %   LYMPH% 29.7 14.0 - 49.7 %   MONO% 7.0 0.0 - 14.0 %   EOS% 2.2 0.0 - 7.0 %   BASO% 0.3 0.0 - 2.0 %  Comprehensive metabolic panel  Result Value Ref Range   Sodium 138 136 - 145 mEq/L   Potassium 4.0 3.5 - 5.1 mEq/L   Chloride 104 98 - 109 mEq/L   CO2 25 22 - 29 mEq/L   Glucose 111 70 - 140 mg/dl   BUN 6.8 (L) 7.0 - 26.0 mg/dL   Creatinine 0.7 0.6 - 1.1 mg/dL   Total Bilirubin <0.30 0.20 - 1.20 mg/dL   Alkaline Phosphatase 107 40 - 150 U/L   AST 16 5 - 34 U/L   ALT 26 0 - 55 U/L   Total Protein 6.6 6.4 - 8.3 g/dL   Albumin 3.6 3.5 - 5.0 g/dL   Calcium 9.1 8.4 - 10.4 mg/dL   Anion Gap 10 3 - 11 mEq/L   EGFR 84 (L) >90 ml/min/1.73 m2   Chemistries available at time of visit, LFTs now in normal range  CA 125 available after visit 24.6  Studies/Results:  No results found.  Medications: I have reviewed the patient's current medications. Hold Zofran, have compazine available at home instead  DISCUSSION Clinically doing well despite variable LFT elevations. Appreciate Dr Perini's review, will try holding  zofran. Patient understands that the combination of taxol and carboplatin is generally most beneficial for this type of cancer, and is in agreement with resuming taxol with cycle 3.   Patient will contact gyn oncology to see if ok to resume lifting >5-10 lbs now.  Assessment/Plan:  1. 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 Neutropenic day 14 cycle 1. Cycle 2 delayed from 04-08-16 with elevated LFTs, given as carbo only on 04-15-16.  Genetics testing sent 04-25-16, fallopian ca and also multiple melanomas. 2. Elevated AP, AST, ALT  variable and preceded start of chemo. Held taxol cycle 2 with this problem. Possibly due to zofran, which will be held. LFTs normal today. Will resume taxol with cycle 3. 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 Follows   every 6 months with dermatologist Dr Linus Galas. Punch biopsy of scalp healing well, will need MOHs for basal cell there, timing as above.Marland Kitchen  4.benign right ovarian cyst: evaluated 08-2014 at which time there was no evidence of malignancy, and confirmed by pathology from this surgery 5.elevated cholesterol followed by Dr Joylene Draft 6.diverticulosis by colonoscopy, no apparent symptoms 7.degenerative arthritis hands and knees, mild 8.history of migraine HAs, not exacerbated by zofran  9.social stress with death of grandchild by suicide a year ago  41.advance directives in place  All questions answered and patient is in agreement with recommendations and plans. Chemo orders revised, no zofran, resume taxol, granix. Time spent 25 min including >50% counseling and coordination of care. Route with CMET from today to Dr Joylene Draft    Evlyn Clines, MD   04/28/2016, 3:01 PM

## 2016-04-28 NOTE — Telephone Encounter (Signed)
-----   Message from Gordy Levan, MD sent at 04/28/2016  3:00 PM EDT ----- Compazine 10 mg  One every 6 hrs prn nausea   #20

## 2016-04-29 ENCOUNTER — Ambulatory Visit: Payer: Medicare Other

## 2016-04-29 ENCOUNTER — Other Ambulatory Visit: Payer: Medicare Other

## 2016-04-29 LAB — CA 125: Cancer Antigen (CA) 125: 24.6 U/mL (ref 0.0–38.1)

## 2016-05-06 ENCOUNTER — Ambulatory Visit (HOSPITAL_BASED_OUTPATIENT_CLINIC_OR_DEPARTMENT_OTHER): Payer: Medicare Other

## 2016-05-06 ENCOUNTER — Other Ambulatory Visit: Payer: Self-pay | Admitting: Medical Oncology

## 2016-05-06 ENCOUNTER — Ambulatory Visit: Payer: Medicare Other

## 2016-05-06 ENCOUNTER — Other Ambulatory Visit (HOSPITAL_BASED_OUTPATIENT_CLINIC_OR_DEPARTMENT_OTHER): Payer: Medicare Other

## 2016-05-06 VITALS — BP 142/66 | HR 77 | Temp 97.9°F | Resp 17

## 2016-05-06 DIAGNOSIS — Z95828 Presence of other vascular implants and grafts: Secondary | ICD-10-CM

## 2016-05-06 DIAGNOSIS — Z5111 Encounter for antineoplastic chemotherapy: Secondary | ICD-10-CM

## 2016-05-06 DIAGNOSIS — C5702 Malignant neoplasm of left fallopian tube: Secondary | ICD-10-CM | POA: Diagnosis not present

## 2016-05-06 LAB — COMPREHENSIVE METABOLIC PANEL
ALT: 22 U/L (ref 0–55)
ANION GAP: 13 meq/L — AB (ref 3–11)
AST: 12 U/L (ref 5–34)
Albumin: 3.6 g/dL (ref 3.5–5.0)
Alkaline Phosphatase: 95 U/L (ref 40–150)
BUN: 10.4 mg/dL (ref 7.0–26.0)
CHLORIDE: 104 meq/L (ref 98–109)
CO2: 20 meq/L — AB (ref 22–29)
Calcium: 9.2 mg/dL (ref 8.4–10.4)
Creatinine: 0.7 mg/dL (ref 0.6–1.1)
EGFR: 79 mL/min/{1.73_m2} — AB (ref >90)
Glucose: 202 mg/dl — ABNORMAL HIGH (ref 70–140)
Potassium: 4.2 mEq/L (ref 3.5–5.1)
SODIUM: 137 meq/L (ref 136–145)
TOTAL PROTEIN: 6.8 g/dL (ref 6.4–8.3)

## 2016-05-06 LAB — CBC WITH DIFFERENTIAL/PLATELET
BASO%: 0 % (ref 0.0–2.0)
Basophils Absolute: 0 10*3/uL (ref 0.0–0.1)
EOS%: 0 % (ref 0.0–7.0)
Eosinophils Absolute: 0 10*3/uL (ref 0.0–0.5)
HEMATOCRIT: 33.9 % — AB (ref 34.8–46.6)
HGB: 11.5 g/dL — ABNORMAL LOW (ref 11.6–15.9)
LYMPH#: 0.4 10*3/uL — AB (ref 0.9–3.3)
LYMPH%: 20.6 % (ref 14.0–49.7)
MCH: 29.9 pg (ref 25.1–34.0)
MCHC: 33.9 g/dL (ref 31.5–36.0)
MCV: 88.3 fL (ref 79.5–101.0)
MONO#: 0 10*3/uL — ABNORMAL LOW (ref 0.1–0.9)
MONO%: 0.5 % (ref 0.0–14.0)
NEUT#: 1.5 10*3/uL (ref 1.5–6.5)
NEUT%: 78.9 % — AB (ref 38.4–76.8)
Platelets: 172 10*3/uL (ref 145–400)
RBC: 3.84 10*6/uL (ref 3.70–5.45)
RDW: 14.4 % (ref 11.2–14.5)
WBC: 1.9 10*3/uL — ABNORMAL LOW (ref 3.9–10.3)

## 2016-05-06 MED ORDER — FAMOTIDINE IN NACL 20-0.9 MG/50ML-% IV SOLN
20.0000 mg | Freq: Once | INTRAVENOUS | Status: AC
  Administered 2016-05-06: 20 mg via INTRAVENOUS

## 2016-05-06 MED ORDER — PACLITAXEL CHEMO INJECTION 300 MG/50ML
175.0000 mg/m2 | Freq: Once | INTRAVENOUS | Status: AC
  Administered 2016-05-06: 300 mg via INTRAVENOUS
  Filled 2016-05-06: qty 50

## 2016-05-06 MED ORDER — SODIUM CHLORIDE 0.9 % IV SOLN
378.5000 mg | Freq: Once | INTRAVENOUS | Status: AC
  Administered 2016-05-06: 380 mg via INTRAVENOUS
  Filled 2016-05-06: qty 38

## 2016-05-06 MED ORDER — SODIUM CHLORIDE 0.9 % IV SOLN
Freq: Once | INTRAVENOUS | Status: AC
  Administered 2016-05-06: 10:00:00 via INTRAVENOUS

## 2016-05-06 MED ORDER — HEPARIN SOD (PORK) LOCK FLUSH 100 UNIT/ML IV SOLN
500.0000 [IU] | Freq: Once | INTRAVENOUS | Status: AC | PRN
  Administered 2016-05-06: 500 [IU]
  Filled 2016-05-06: qty 5

## 2016-05-06 MED ORDER — PROCHLORPERAZINE EDISYLATE 5 MG/ML IJ SOLN
10.0000 mg | Freq: Once | INTRAMUSCULAR | Status: AC
  Administered 2016-05-06: 10 mg via INTRAVENOUS

## 2016-05-06 MED ORDER — PROCHLORPERAZINE EDISYLATE 5 MG/ML IJ SOLN
INTRAMUSCULAR | Status: AC
  Filled 2016-05-06: qty 2

## 2016-05-06 MED ORDER — SODIUM CHLORIDE 0.9% FLUSH
10.0000 mL | INTRAVENOUS | Status: DC | PRN
  Administered 2016-05-06: 10 mL
  Filled 2016-05-06: qty 10

## 2016-05-06 MED ORDER — DIPHENHYDRAMINE HCL 50 MG/ML IJ SOLN
INTRAMUSCULAR | Status: AC
  Filled 2016-05-06: qty 1

## 2016-05-06 MED ORDER — FAMOTIDINE IN NACL 20-0.9 MG/50ML-% IV SOLN
INTRAVENOUS | Status: AC
  Filled 2016-05-06: qty 50

## 2016-05-06 MED ORDER — SODIUM CHLORIDE 0.9 % IJ SOLN
10.0000 mL | INTRAMUSCULAR | Status: DC | PRN
  Administered 2016-05-06: 10 mL via INTRAVENOUS
  Filled 2016-05-06: qty 10

## 2016-05-06 MED ORDER — SODIUM CHLORIDE 0.9 % IV SOLN
20.0000 mg | Freq: Once | INTRAVENOUS | Status: AC
  Administered 2016-05-06: 20 mg via INTRAVENOUS
  Filled 2016-05-06: qty 2

## 2016-05-06 MED ORDER — DIPHENHYDRAMINE HCL 50 MG/ML IJ SOLN
25.0000 mg | Freq: Once | INTRAMUSCULAR | Status: AC
  Administered 2016-05-06: 25 mg via INTRAVENOUS

## 2016-05-06 NOTE — Patient Instructions (Signed)

## 2016-05-06 NOTE — Patient Instructions (Signed)
[facility] Discharge Instructions for Patients Receiving Chemotherapy  Today you received the following chemotherapy agents Taxol and Carboplatin. To help prevent nausea and vomiting after your treatment, we encourage you to take your nausea medication as directed.  If you develop nausea and vomiting that is not controlled by your nausea medication, call the clinic.   BELOW ARE SYMPTOMS THAT SHOULD BE REPORTED IMMEDIATELY:  *FEVER GREATER THAN 100.5 F  *CHILLS WITH OR WITHOUT FEVER  NAUSEA AND VOMITING THAT IS NOT CONTROLLED WITH YOUR NAUSEA MEDICATION  *UNUSUAL SHORTNESS OF BREATH  *UNUSUAL BRUISING OR BLEEDING  TENDERNESS IN MOUTH AND THROAT WITH OR WITHOUT PRESENCE OF ULCERS  *URINARY PROBLEMS  *BOWEL PROBLEMS  UNUSUAL RASH Items with * indicate a potential emergency and should be followed up as soon as possible.  Feel free to call the clinic you have any questions or concerns. The clinic phone number is [phone].  Please show the CHEMO ALERT CARD at check-in to the Emergency Department and triage nurse.    

## 2016-05-07 ENCOUNTER — Ambulatory Visit (HOSPITAL_BASED_OUTPATIENT_CLINIC_OR_DEPARTMENT_OTHER): Payer: Medicare Other

## 2016-05-07 VITALS — BP 113/58 | HR 85 | Temp 98.2°F | Resp 18

## 2016-05-07 DIAGNOSIS — Z95828 Presence of other vascular implants and grafts: Secondary | ICD-10-CM

## 2016-05-07 DIAGNOSIS — C5702 Malignant neoplasm of left fallopian tube: Secondary | ICD-10-CM

## 2016-05-07 MED ORDER — TBO-FILGRASTIM 300 MCG/0.5ML ~~LOC~~ SOSY
300.0000 ug | PREFILLED_SYRINGE | Freq: Once | SUBCUTANEOUS | Status: AC
Start: 2016-05-07 — End: 2016-05-07
  Administered 2016-05-07: 300 ug via SUBCUTANEOUS

## 2016-05-07 NOTE — Patient Instructions (Signed)

## 2016-05-09 ENCOUNTER — Ambulatory Visit (HOSPITAL_BASED_OUTPATIENT_CLINIC_OR_DEPARTMENT_OTHER): Payer: Medicare Other

## 2016-05-09 VITALS — BP 107/68 | HR 85 | Temp 98.1°F | Resp 18

## 2016-05-09 DIAGNOSIS — C5702 Malignant neoplasm of left fallopian tube: Secondary | ICD-10-CM | POA: Diagnosis present

## 2016-05-09 DIAGNOSIS — Z5189 Encounter for other specified aftercare: Secondary | ICD-10-CM | POA: Diagnosis not present

## 2016-05-09 DIAGNOSIS — Z95828 Presence of other vascular implants and grafts: Secondary | ICD-10-CM

## 2016-05-09 MED ORDER — TBO-FILGRASTIM 300 MCG/0.5ML ~~LOC~~ SOSY
300.0000 ug | PREFILLED_SYRINGE | Freq: Once | SUBCUTANEOUS | Status: AC
  Administered 2016-05-09: 300 ug via SUBCUTANEOUS
  Filled 2016-05-09: qty 0.5

## 2016-05-13 ENCOUNTER — Telehealth: Payer: Self-pay | Admitting: Genetic Counselor

## 2016-05-13 ENCOUNTER — Telehealth: Payer: Self-pay | Admitting: Oncology

## 2016-05-13 ENCOUNTER — Encounter: Payer: Self-pay | Admitting: Genetic Counselor

## 2016-05-13 DIAGNOSIS — Z1379 Encounter for other screening for genetic and chromosomal anomalies: Secondary | ICD-10-CM | POA: Insufficient documentation

## 2016-05-13 NOTE — Telephone Encounter (Signed)
LM on VM with good news.  Asked that she CB.  Left CB instructions.

## 2016-05-13 NOTE — Telephone Encounter (Signed)
lvm to inform pt of 8/3 appt time change per LL 7/27 orders

## 2016-05-13 NOTE — Telephone Encounter (Signed)
Revealed negative genetic testing on a custom panel looking at genes associated with breast, ovarian cancer and melanoma.  No pathogenic mutations were found in these genes.  Discussed that we do not know why she has developed fallopian tube cancer and melanoma.  As our technology improves we may have additional testing that may be important to her.  We would recall her at that point.  Patient voiced her understanding.

## 2016-05-18 ENCOUNTER — Other Ambulatory Visit: Payer: Self-pay | Admitting: Oncology

## 2016-05-19 ENCOUNTER — Ambulatory Visit (HOSPITAL_BASED_OUTPATIENT_CLINIC_OR_DEPARTMENT_OTHER): Payer: Medicare Other | Admitting: Oncology

## 2016-05-19 ENCOUNTER — Other Ambulatory Visit (HOSPITAL_BASED_OUTPATIENT_CLINIC_OR_DEPARTMENT_OTHER): Payer: Medicare Other

## 2016-05-19 ENCOUNTER — Encounter: Payer: Self-pay | Admitting: Oncology

## 2016-05-19 ENCOUNTER — Telehealth: Payer: Self-pay | Admitting: Oncology

## 2016-05-19 ENCOUNTER — Other Ambulatory Visit: Payer: Self-pay | Admitting: Oncology

## 2016-05-19 VITALS — BP 97/58 | HR 69 | Temp 98.3°F | Resp 18 | Ht 64.0 in | Wt 139.3 lb

## 2016-05-19 DIAGNOSIS — Z95828 Presence of other vascular implants and grafts: Secondary | ICD-10-CM

## 2016-05-19 DIAGNOSIS — T451X5A Adverse effect of antineoplastic and immunosuppressive drugs, initial encounter: Secondary | ICD-10-CM

## 2016-05-19 DIAGNOSIS — D701 Agranulocytosis secondary to cancer chemotherapy: Secondary | ICD-10-CM

## 2016-05-19 DIAGNOSIS — C5702 Malignant neoplasm of left fallopian tube: Secondary | ICD-10-CM

## 2016-05-19 DIAGNOSIS — C5700 Malignant neoplasm of unspecified fallopian tube: Secondary | ICD-10-CM

## 2016-05-19 LAB — COMPREHENSIVE METABOLIC PANEL
ALBUMIN: 3.5 g/dL (ref 3.5–5.0)
ALK PHOS: 88 U/L (ref 40–150)
ALT: 26 U/L (ref 0–55)
ANION GAP: 10 meq/L (ref 3–11)
AST: 16 U/L (ref 5–34)
BUN: 6.9 mg/dL — ABNORMAL LOW (ref 7.0–26.0)
CALCIUM: 9.1 mg/dL (ref 8.4–10.4)
CO2: 22 mEq/L (ref 22–29)
Chloride: 107 mEq/L (ref 98–109)
Creatinine: 0.7 mg/dL (ref 0.6–1.1)
EGFR: 85 mL/min/{1.73_m2} — AB (ref >90)
Glucose: 115 mg/dl (ref 70–140)
POTASSIUM: 4.1 meq/L (ref 3.5–5.1)
Sodium: 139 mEq/L (ref 136–145)
Total Bilirubin: <0.3 mg/dL (ref 0.20–1.20)
Total Protein: 6.5 g/dL (ref 6.4–8.3)

## 2016-05-19 LAB — CBC WITH DIFFERENTIAL/PLATELET
BASO%: 1.1 % (ref 0.0–2.0)
BASOS ABS: 0 10*3/uL (ref 0.0–0.1)
EOS ABS: 0 10*3/uL (ref 0.0–0.5)
EOS%: 1.6 % (ref 0.0–7.0)
HCT: 34.7 % — ABNORMAL LOW (ref 34.8–46.6)
HGB: 11.5 g/dL — ABNORMAL LOW (ref 11.6–15.9)
LYMPH%: 35.9 % (ref 14.0–49.7)
MCH: 29.3 pg (ref 25.1–34.0)
MCHC: 33 g/dL (ref 31.5–36.0)
MCV: 88.7 fL (ref 79.5–101.0)
MONO#: 0.2 10*3/uL (ref 0.1–0.9)
MONO%: 9.8 % (ref 0.0–14.0)
NEUT#: 1.3 10*3/uL — ABNORMAL LOW (ref 1.5–6.5)
NEUT%: 51.6 % (ref 38.4–76.8)
Platelets: 229 10*3/uL (ref 145–400)
RBC: 3.91 10*6/uL (ref 3.70–5.45)
RDW: 15.5 % — AB (ref 11.2–14.5)
WBC: 2.5 10*3/uL — AB (ref 3.9–10.3)
lymph#: 0.9 10*3/uL (ref 0.9–3.3)

## 2016-05-19 MED ORDER — DEXAMETHASONE 4 MG PO TABS: ORAL_TABLET | ORAL | 0 refills | Status: DC

## 2016-05-19 NOTE — Telephone Encounter (Signed)
per pof to sch pt appt-gave pt copy of avs °

## 2016-05-19 NOTE — Progress Notes (Signed)
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 She had cycle 3 carboplatin taxol on 05-06-16, with granix on days 2 and 4. She has no elevation of LFTs today, this possibly related to zofran with earlier treatment.   Patient continues to tolerate the chemotherapy well overall, and is feeling well today. She felt generally badly days 3 and 4, tho minimal nausea and not severe aches. She used prn ativan only x1 for nausea, prefers not to take medication if she can avoid it. She has slight neuropathy tip of right thumb and tips on toes, not bothersome. Bowels are moving regularly with good diet. She tries to drink fluids well and denies orthostatic symptoms. She has no abdominal or pelvic pain, no fever, no bleeding, no SOB, no new or different pain. Marland Kitchen No problems with PAC. Remainder of 10 point Review of Systems negative.     PAC by IR 03-15-16 CA 125 preoperatively 656 Genetics testing sent 04-25-16, custom panel to [facility] (melanoma and fallopian ca) negative.  She is Dr Sherrine Maples Crystal's neighbor.  ONCOLOGIC HISTORY Patient had history of benign right ovarian cyst and uterine fibroids, for which she saw Dr Yolande Jolly in 08-2014, CA 125 slightly elevated then which was felt due to fibroids. She developed mild RUQ discomfort 08-2015, which she noticed mostly when taking a deep breath. She had CT AP by Dr Waynard Edwards 01-27-16 which showed carcinomatosis, with right adnexal lesion 2.8 x 2.7 x 3.5 cm without solid component, unremarkable left ovary, no ascites, no abdominal or pelvic adenopathy. CA 125 was 656 on 02-08-16. She had consultation with Dr Andrey Farmer on 02-08-16, then CT guided core needle biopsy of omental mass on 02-15-16, with  pathology ([id number]) papillary serous neoplasm which appeared low grade. CXR 02-16-16 small amount of right pleural thickening or fluid and linear atelectasis or scarring left lung base.She had exploratory laparotomy with TAH, BSO, omentectomy and radical debulking by Dr Andrey Farmer on 02-18-16, intraoperative findings remarkable for 10 cm omental cake, milial studding 1 mm pelvic surfaces, right colic gutter, right diaphragm and liver, mesentery of transverse colon and rectum, 3 cm cystic mass in right ovary. Procedure included argon beam coagulation of miliary tumor implants, with no residual disease apparent at completion. Surgical pathology [id number]) high grade serous carcinoma of left fallopian tube involving omentum, with benign serous right ovarian cyst 3.5 cm. Post operatively she has slow recovery of bowel function, but was stable for DC on 02-22-16. Case was presented at gyn oncology multidisciplinary conference on 03-07-16, with recommendation for 6 cycles of adjuvant carboplatin taxol. Cycle 1 carboplatin taxol was given 03-18-16, neutropenic with ANC 0.2 on day 14 cycle 1. Cycle 2 was delayed with elevated AP, AST, ALT of unclear etiology, given as carboplatin only on 04-15-16 also due to the LFT changes. With possibility that LFT elevations were related to zofran, that medication was discontinued and patient tolerated carboplatin + taxol with no difficulty and normal LFTs for cycle 3.   Objective:  Vital signs in last 24 hours:  BP (!) 97/58 (BP Location: Left Arm, Patient Position: Sitting)   Pulse 69   Temp 98.3 F (36.8 C) (Oral)   Resp 18   Ht 5\' 4"  (1.626 m)   Wt 139 lb 4.8 oz (63.2  kg)   SpO2 100%   BMI 23.91 kg/m  Weight down 2 lbs.  Alert, oriented and appropriate. Ambulatory without difficulty, able to get on and off exam table  Alopecia  HEENT:PERRL, sclerae not icteric. Oral mucosa moist without lesions, posterior pharynx clear.  Neck supple. No JVD.  Lymphatics:no  cervical,supraclavicular or inguinal adenopathy Resp: clear to auscultation bilaterally and normal percussion bilaterally Cardio: regular rate and rhythm. No gallop. GI: soft, nontender, not distended, no mass or organomegaly. Normally active bowel sounds. Surgical incision not remarkable. Musculoskeletal/ Extremities: without pitting edema, cords, tenderness Neuro: no significant peripheral neuropathy. Otherwise nonfocal. PSYCH appropriate mood and affect Skin without rash, ecchymosis, petechiae Portacath-without erythema or tenderness  Lab Results:  Results for orders placed or performed in visit on 05/19/16  CBC with Differential  Result Value Ref Range   WBC 2.5 (L) 3.9 - 10.3 10e3/uL   NEUT# 1.3 (L) 1.5 - 6.5 10e3/uL   HGB 11.5 (L) 11.6 - 15.9 g/dL   HCT 34.7 (L) 34.8 - 46.6 %   Platelets 229 145 - 400 10e3/uL   MCV 88.7 79.5 - 101.0 fL   MCH 29.3 25.1 - 34.0 pg   MCHC 33.0 31.5 - 36.0 g/dL   RBC 3.91 3.70 - 5.45 10e6/uL   RDW 15.5 (H) 11.2 - 14.5 %   lymph# 0.9 0.9 - 3.3 10e3/uL   MONO# 0.2 0.1 - 0.9 10e3/uL   Eosinophils Absolute 0.0 0.0 - 0.5 10e3/uL   Basophils Absolute 0.0 0.0 - 0.1 10e3/uL   NEUT% 51.6 38.4 - 76.8 %   LYMPH% 35.9 14.0 - 49.7 %   MONO% 9.8 0.0 - 14.0 %   EOS% 1.6 0.0 - 7.0 %   BASO% 1.1 0.0 - 2.0 %  Comprehensive metabolic panel  Result Value Ref Range   Sodium 139 136 - 145 mEq/L   Potassium 4.1 3.5 - 5.1 mEq/L   Chloride 107 98 - 109 mEq/L   CO2 22 22 - 29 mEq/L   Glucose 115 70 - 140 mg/dl   BUN 6.9 (L) 7.0 - 26.0 mg/dL   Creatinine 0.7 0.6 - 1.1 mg/dL   Total Bilirubin <0.30 0.20 - 1.20 mg/dL   Alkaline Phosphatase 88 40 - 150 U/L   AST 16 5 - 34 U/L   ALT 26 0 - 55 U/L   Total Protein 6.5 6.4 - 8.3 g/dL   Albumin 3.5 3.5 - 5.0 g/dL   Calcium 9.1 8.4 - 10.4 mg/dL   Anion Gap 10 3 - 11 mEq/L   EGFR 85 (L) >90 ml/min/1.73 m2    CA 125 on 04-28-16   24.6  Studies/Results:  No results found.  Medications: I have reviewed the  patient's current medications. Not using zofran  DISCUSSION Interval history reviewed. We are pleased that LFTs are normal, now off zofran, and I will let Dr Joylene Draft know. Patient is in agreement with continuing treatment as planned.   Reminded her to push po fluids, BP discussed  Assessment/Plan:  1. 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 Neutropenic day 14 cycle 1. Cycle 2 delayed from 04-08-16 with elevated LFTs, given as [place] only on 04-15-16. Cycle 3 carbo taxol tolerated without problems (LFTs normal off of zofran). Genetics testing normal 04-25-16, fallopian ca and also multiple melanomas. 2. Elevated AP, AST, ALT  variable and preceded start of chemo.  Held taxol cycle 2 with this problem. Possibly due to zofran, now held. 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 Follows every 6 months with dermatologist Dr Linus Galas. Punch biopsy of scalp healing well, will need MOHs for basal cell there, after chemo completed.   4.benign right ovarian cyst: evaluated 08-2014 at which time there was no evidence of malignancy, and confirmed by pathology from this surgery 5.elevated cholesterol followed by Dr Joylene Draft 6.diverticulosis by colonoscopy, no apparent symptoms 7.degenerative arthritis hands and knees, mild 8.history of migraine HAs, not exacerbated by zofran  9.social stress with death of grandchild by suicide a year ago  64.advance directives in place  All questions answered. Chemo and granix orders confirmed. Time spent 25 min including >50% counseling and coordination of care. Route Dr Joylene Draft.     Evlyn Clines, MD   05/19/2016, 9:00 PM

## 2016-05-20 ENCOUNTER — Ambulatory Visit: Payer: Medicare Other

## 2016-05-20 ENCOUNTER — Other Ambulatory Visit: Payer: Medicare Other

## 2016-05-25 ENCOUNTER — Ambulatory Visit: Admit: 2016-05-25 | Discharge: 2016-05-25 | Payer: MEDICARE | Attending: Specialist | Primary: Family Medicine

## 2016-05-25 DIAGNOSIS — C50911 Malignant neoplasm of unspecified site of right female breast: Secondary | ICD-10-CM

## 2016-05-25 NOTE — Progress Notes (Signed)
[facility]  [address] [place]   [address]  W: [phone]   F: [phone]      f/u HEME/ONC CONSULT    Reason for visit: management of breast cancer     Consulting physician:  Dr. Gilford Rile  Referring physician:  Dr. Minette Brine    HPI:   Dawn Green is a 75 y.o.  female who I am seeing in f/u for management of breast cancer.  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  Right lumpectomy on 04/24/13 showed 1.5 cm IDC, gr 2, no LVI, HER 2 negative (ratio 1.2, sig/cell 3), 0/3 LN involved, DCIS present with extensive intraductal component.    Was on estrogen patch for 10 years, stopped at diagnosis.      oncotype Dx = 23, intermediate, RR = 15%, ER + at 9.7, PR + at 7.5; HER 2 negative at 8.7    05/08/13 re-ex negative    S/p XRT from 06/19/13-07/10/13    Started anastrozole 07/17/13    Interval history: complains of gr 2 loss of appetite, gr 2 fatigue, gr 3 all day hot flashes, gr 1 insomnia, gr 1 itching.    Normal colonoscopy summer 2015        DX   Encounter Diagnoses   Name Primary?   ??? Malignant neoplasm of right female breast, unspecified site of breast ([place]) Yes   ??? Hot flashes related to aromatase inhibitor therapy    ??? 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 Osteopenia, unspecified location    ??? Fatigue, unspecified type    ??? Word finding difficulty    ??? Mild episode of recurrent major depressive disorder [facility])                 Past Medical History:   Diagnosis Date   ??? Breast CA ([place]) 2014    Right - Lumpectomy    ??? Diabetes ([place])    ??? Glaucoma    ??? Hx of mammogram 02/05/2016    Negative per pt. Sees Dr. Laurena Bering    ??? Hyperlipemia    ??? Hypertension    ??? 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 Other ill-defined conditions 2016    Mild URI x 4 months (allergies)     ??? Routine Papanicolaou smear 1996    Negative per pt.    ??? 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 BREAST SURGERY PROCEDURE UNLISTED Right 2014    Right breast lumpectomy   ??? HX BREAST BIOPSY Left     x 2 negative biopsies    ??? HX CATARACT REMOVAL Bilateral     w/ IOL implants   ??? HX TAH AND BSO  1997    for rapidly growing fibroid ([facility]) - benign.  Dr. Oran Rein.   ??? HX TUBAL LIGATION  1972     Social History     Social History   ??? Marital status: MARRIED     Spouse name: N/A   ??? Number of children: N/A   ??? Years of education: N/A  Social History Main Topics   ??? Smoking status: Former Smoker     Packs/day: 1.00     Years: 30.00     Quit date: 04/17/1996   ??? Smokeless tobacco: Never Used      Comment: Never used vapor or e-cigs    ??? Alcohol use 7.0 oz/week     14 Standard drinks or equivalent per week      Comment: 1 gin and tonics per day   ??? Drug use: No   ??? Sexual activity: Yes     Partners: Male     Birth control/ protection: Surgical     Other Topics Concern   ??? None     Social History Narrative     Family History   Problem Relation Age of Onset   ??? Cancer Mother 32     Ovarian - Passed away in 07-Jan-1965   ??? Other Son 22     Pancreatitis    ??? Cancer Paternal Aunt 81     Pancreatic       Current Outpatient Prescriptions   Medication Sig Dispense Refill   ??? ergocalciferol (VITAMIN D2) 50,000 unit capsule TAKE 1 CAPSULE EVERY 7 DAYS 12 Cap 4   ??? Cetirizine (ZYRTEC) 10 mg cap Take  by mouth daily.     ??? venlafaxine-SR (EFFEXOR-XR) 37.5 mg capsule Take 1 Cap by mouth daily. Dose may be taken before bedtime. 90 Cap 2   ??? valsartan-hydroCHLOROthiazide (DIOVAN-HCT) 80-12.5 mg per tablet Take 1 Tab by mouth daily. 90 Tab 3   ??? atorvastatin (LIPITOR) 40 mg tablet Take 1 Tab by mouth Daily (before breakfast). 90 Tab 3   ??? anastrozole (ARIMIDEX) 1 mg tablet TAKE 1 TABLET ONCE DAILY 90 Tab 3    ??? alendronate (FOSAMAX) 35 mg tablet Take 1 Tab by mouth every seven (7) days. 12 Tab 3   ??? krill-om-3-dha-epa-phospho-ast (MEGARED OMEGA-3 KRILL OIL) 1,000-230-60 mg cap Take 1 Tab by mouth two (2) times a day.     ??? brimonidine-timolol (COMBIGAN) 0.2-0.5 % drop ophthalmic solution Administer 1 Drop to both eyes every twelve (12) hours.     ??? aspirin 81 mg chewable tablet Take 81 mg by mouth daily.         Allergies   Allergen Reactions   ??? Shampoo & Body Wash [Skin Cleanser,General] Itching     Paul mitchell       Review of Systems    A comprehensive review of systems was performed and all systems were negative except for HPI and for the symptom report form, reviewed and scanned in.        Objective:  Physical Exam:  Visit Vitals   ??? BP 131/70   ??? Pulse 72   ??? Temp 97.8 ??F (36.6 ??C) (Temporal)   ??? Resp 18   ??? Ht '5\' 3"'  (1.6 m)   ??? Wt 164 lb 12.8 oz (74.8 kg)   ??? SpO2 97%   ??? BMI 29.19 kg/m2       General:  Alert, cooperative, no distress, appears stated age.   Head:  Normocephalic, without obvious abnormality, atraumatic.   Eyes:  Conjunctivae/corneas clear. PERRL, EOMs intact.   Throat: Lips, mucosa, and tongue normal.    Neck: Supple, symmetrical, trachea midline, no adenopathy, thyroid: no enlargement/tenderness/nodules   Back:   Symmetric, no curvature. ROM normal. No CVA tenderness.   Lungs:   Clear to auscultation bilaterally.       Heart:  Regular rate and  rhythm, S1, S2 normal, no murmur, click, rub or gallop.   Abdomen:   Soft, non-tender. Bowel sounds normal. No masses,  No organomegaly.   Extremities: Extremities normal, atraumatic, no cyanosis or edema.   Skin: Skin color, texture, turgor normal. No rashes or lesions.   Lymph nodes: Cervical, supraclavicular, and axillary nodes normal.   Neurologic: CNII-XII intact.                                                                     Diagnostic Imaging   No results found for this or any previous visit.    No results found for this or any previous visit.     05/22/13 dexa  Lumbar spine: L1-4   Bone mineral density (gm/cm2): 1.325   % of peak bone mass: 111   % for age matched controls: 131   T score: 1.1   Z score: 2.6   Hip: Right femoral neck   Bone mineral density (gm/cm2): 0.927   % of peak bone mass: 89   % for age matched controls: 97   T score: -0.8   Z score: 0.8   IMPRESSION:   This patient is normal using the World Health Organization criteria   As compared to the prior study, there has been no significant change   10 year probability of major osteoporotic fracture: 8.7%   10 year probability of hip fracture: 0.9%    02/06/15 Bilateral Mammogram  Negative    06/05/15 dexa  Findings:  ??  Femoral Neck: Right  Bone mineral density (gm/cm2):? 0.863  % of peak bone mass: 83  % for age matched controls:? 108  T-score: -1.3  Z-score: 0.5  ??  Total Hip: Right  Bone mineral density (gm/cm2): 0.972  % of peak bone mass: 96  % for age matched controls: 119  T-score: -0.3  Z-score: 1.2  ??  Lumbar Spine: L1-4  Bone mineral density (gm/cm2): 1.146  % of peak bone mass: 96   % for age matched controls: 114  T-score: -0.4  Z-score: 1.1  ??  T score of the distal one third left radius 0.1  ????  ??  IMPRESSION:  ??  This patient is osteopenic using the World Health Organization criteria  As compared to the prior study, there has been a significant 5.2% decrease   in the mean hip and 13.5% decrease in the lumbar spine. Please assess   calcium and vitamin D intake. Please consider secondary causes for   osteoporosis.  10 year probability of major osteoporotic fracture: 10.1%  10 year probability of hip fracture: 1.6%    02/08/16 bilat mammogram  negative    Lab Results  Lab Results   Component Value Date/Time    WBC 4.8 10/02/2015 09:55 AM    HGB 12.6 10/02/2015 09:55 AM    HCT 37.3 10/02/2015 09:55 AM    PLATELET 299 10/02/2015 09:55 AM    MCV 92 10/02/2015 09:55 AM       Lab Results   Component Value Date/Time    Sodium 142 12/30/2015 10:40 AM    Potassium 4.2 12/30/2015 10:40 AM     Chloride 100 12/30/2015 10:40 AM    CO2 26 12/30/2015 10:40 AM  Anion gap 6 04/17/2013 11:01 AM    Glucose 129 12/30/2015 10:40 AM    BUN 15 12/30/2015 10:40 AM    Creatinine 0.53 12/30/2015 10:40 AM    BUN/Creatinine ratio 28 12/30/2015 10:40 AM    GFR est AA 108 12/30/2015 10:40 AM    GFR est non-AA 94 12/30/2015 10:40 AM    Calcium 9.1 12/30/2015 10:40 AM    AST (SGOT) 12 12/30/2015 10:40 AM    Alk. phosphatase 57 12/30/2015 10:40 AM    Protein, total 6.6 12/30/2015 10:40 AM    Albumin 4.2 12/30/2015 10:40 AM    A-G Ratio 1.8 12/30/2015 10:40 AM    ALT (SGPT) 17 12/30/2015 10:40 AM       .    Assessment/Plan:  75 y.o. 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  PS 0    1. Breast cancer stage: IA    Hormonal therapy: administered    No evidence of recurrence, continue anastrozole.    Mammogram April 2018 due    2. Hot flashes:  stable, currently taking Effexor 37.5 mg daily in the PM.  Did not tolerate gabapentin due to oversleeping    3. Insomnia: improved with effexor    4. Osteopenia:  Started fosamax 05/2015; taking vit D 50,000 int units weekly    5. Fatigue:  May be due to anastrozole, she will call me if it worsens    6. Word finding difficulty: improved with word game finding    7. Mild depression, major disorder:  Improved, on effexor    Thank you for this consult.  All of the patient's questions were answered today.          There are no Patient Instructions on file for this visit.   Follow-up Disposition: Not on File    Sonda Rumble MD

## 2016-05-25 NOTE — Progress Notes (Signed)
Dawn Green is a 75 y.o. female here for follow-up of breast cancer.

## 2016-05-26 ENCOUNTER — Other Ambulatory Visit (HOSPITAL_BASED_OUTPATIENT_CLINIC_OR_DEPARTMENT_OTHER): Payer: Medicare Other

## 2016-05-26 ENCOUNTER — Telehealth: Payer: Self-pay

## 2016-05-26 ENCOUNTER — Other Ambulatory Visit: Payer: Self-pay | Admitting: Oncology

## 2016-05-26 DIAGNOSIS — C5702 Malignant neoplasm of left fallopian tube: Secondary | ICD-10-CM

## 2016-05-26 LAB — COMPREHENSIVE METABOLIC PANEL
ALT: 22 U/L (ref 0–55)
ANION GAP: 9 meq/L (ref 3–11)
AST: 16 U/L (ref 5–34)
Albumin: 3.4 g/dL — ABNORMAL LOW (ref 3.5–5.0)
Alkaline Phosphatase: 78 U/L (ref 40–150)
BUN: 9.7 mg/dL (ref 7.0–26.0)
CALCIUM: 9.2 mg/dL (ref 8.4–10.4)
CHLORIDE: 107 meq/L (ref 98–109)
CO2: 26 mEq/L (ref 22–29)
CREATININE: 0.7 mg/dL (ref 0.6–1.1)
EGFR: 86 mL/min/{1.73_m2} — ABNORMAL LOW (ref >90)
Glucose: 77 mg/dl (ref 70–140)
POTASSIUM: 4.2 meq/L (ref 3.5–5.1)
Sodium: 141 mEq/L (ref 136–145)
Total Bilirubin: <0.3 mg/dL (ref 0.20–1.20)
Total Protein: 6.5 g/dL (ref 6.4–8.3)

## 2016-05-26 LAB — CBC WITH DIFFERENTIAL/PLATELET
BASO%: 0.3 % (ref 0.0–2.0)
BASOS ABS: 0 10*3/uL (ref 0.0–0.1)
EOS%: 1 % (ref 0.0–7.0)
Eosinophils Absolute: 0 10*3/uL (ref 0.0–0.5)
HEMATOCRIT: 35.9 % (ref 34.8–46.6)
HGB: 11.9 g/dL (ref 11.6–15.9)
LYMPH#: 1.2 10*3/uL (ref 0.9–3.3)
LYMPH%: 40.3 % (ref 14.0–49.7)
MCH: 30 pg (ref 25.1–34.0)
MCHC: 33.1 g/dL (ref 31.5–36.0)
MCV: 90.4 fL (ref 79.5–101.0)
MONO#: 0.5 10*3/uL (ref 0.1–0.9)
MONO%: 17.8 % — ABNORMAL HIGH (ref 0.0–14.0)
NEUT#: 1.2 10*3/uL — ABNORMAL LOW (ref 1.5–6.5)
NEUT%: 40.6 % (ref 38.4–76.8)
PLATELETS: 264 10*3/uL (ref 145–400)
RBC: 3.97 10*6/uL (ref 3.70–5.45)
RDW: 14.7 % — ABNORMAL HIGH (ref 11.2–14.5)
WBC: 3 10*3/uL — ABNORMAL LOW (ref 3.9–10.3)

## 2016-05-26 NOTE — Telephone Encounter (Signed)
Dr Marko Plume evaluated labs and delayed chemo for 1 week. Pt aware to expect call to schedule lab/chemo and injection appts.

## 2016-05-27 ENCOUNTER — Telehealth: Payer: Self-pay | Admitting: Oncology

## 2016-05-27 ENCOUNTER — Other Ambulatory Visit: Payer: Medicare Other

## 2016-05-27 ENCOUNTER — Ambulatory Visit: Payer: Medicare Other

## 2016-05-27 NOTE — Telephone Encounter (Signed)
lvm to inform pt of 8/17 lab appt per LL los. Pt will get updated schedule at that visit

## 2016-05-28 ENCOUNTER — Ambulatory Visit: Payer: Medicare Other

## 2016-05-30 ENCOUNTER — Ambulatory Visit: Payer: Medicare Other

## 2016-06-02 ENCOUNTER — Other Ambulatory Visit: Payer: Medicare Other

## 2016-06-02 ENCOUNTER — Telehealth: Payer: Self-pay

## 2016-06-02 ENCOUNTER — Other Ambulatory Visit (HOSPITAL_BASED_OUTPATIENT_CLINIC_OR_DEPARTMENT_OTHER): Payer: Medicare Other

## 2016-06-02 DIAGNOSIS — C5702 Malignant neoplasm of left fallopian tube: Secondary | ICD-10-CM | POA: Diagnosis present

## 2016-06-02 LAB — CBC WITH DIFFERENTIAL/PLATELET
BASO%: 0.2 % (ref 0.0–2.0)
Basophils Absolute: 0 10*3/uL (ref 0.0–0.1)
EOS%: 0.9 % (ref 0.0–7.0)
Eosinophils Absolute: 0 10*3/uL (ref 0.0–0.5)
HEMATOCRIT: 34.6 % — AB (ref 34.8–46.6)
HGB: 11.7 g/dL (ref 11.6–15.9)
LYMPH#: 1.5 10*3/uL (ref 0.9–3.3)
LYMPH%: 35.8 % (ref 14.0–49.7)
MCH: 30.2 pg (ref 25.1–34.0)
MCHC: 33.8 g/dL (ref 31.5–36.0)
MCV: 89.4 fL (ref 79.5–101.0)
MONO#: 0.5 10*3/uL (ref 0.1–0.9)
MONO%: 11.2 % (ref 0.0–14.0)
NEUT#: 2.2 10*3/uL (ref 1.5–6.5)
NEUT%: 51.9 % (ref 38.4–76.8)
Platelets: 237 10*3/uL (ref 145–400)
RBC: 3.87 10*6/uL (ref 3.70–5.45)
RDW: 14.7 % — AB (ref 11.2–14.5)
WBC: 4.3 10*3/uL (ref 3.9–10.3)

## 2016-06-02 LAB — COMPREHENSIVE METABOLIC PANEL
ALT: 19 U/L (ref 0–55)
AST: 14 U/L (ref 5–34)
Albumin: 3.5 g/dL (ref 3.5–5.0)
Alkaline Phosphatase: 81 U/L (ref 40–150)
Anion Gap: 7 mEq/L (ref 3–11)
BUN: 8.7 mg/dL (ref 7.0–26.0)
CALCIUM: 9.2 mg/dL (ref 8.4–10.4)
CHLORIDE: 106 meq/L (ref 98–109)
CO2: 26 meq/L (ref 22–29)
CREATININE: 0.7 mg/dL (ref 0.6–1.1)
EGFR: 85 mL/min/{1.73_m2} — ABNORMAL LOW (ref >90)
Glucose: 95 mg/dl (ref 70–140)
Potassium: 3.9 mEq/L (ref 3.5–5.1)
Sodium: 139 mEq/L (ref 136–145)
Total Bilirubin: <0.3 mg/dL (ref 0.20–1.20)
Total Protein: 6.6 g/dL (ref 6.4–8.3)

## 2016-06-02 NOTE — Telephone Encounter (Signed)
-----   Message from Gordy Levan, MD sent at 05/26/2016  4:55 PM EDT ----- Counts too low for chemo on 8-11, delayed x 1 week  Will recheck counts on 8-17 - need to let her know if ok to treat on 8-18.  OK if ANC >=1.5 and plt >=100k  thanks

## 2016-06-02 NOTE — Telephone Encounter (Signed)
S/w pt that cbc was good for pt to receive chemo tomorrow.

## 2016-06-03 ENCOUNTER — Ambulatory Visit: Payer: Medicare Other

## 2016-06-03 ENCOUNTER — Other Ambulatory Visit: Payer: Medicare Other

## 2016-06-03 ENCOUNTER — Ambulatory Visit (HOSPITAL_BASED_OUTPATIENT_CLINIC_OR_DEPARTMENT_OTHER): Payer: Medicare Other

## 2016-06-03 DIAGNOSIS — C5702 Malignant neoplasm of left fallopian tube: Secondary | ICD-10-CM

## 2016-06-03 DIAGNOSIS — Z5111 Encounter for antineoplastic chemotherapy: Secondary | ICD-10-CM | POA: Diagnosis not present

## 2016-06-03 MED ORDER — DIPHENHYDRAMINE HCL 50 MG/ML IJ SOLN
25.0000 mg | Freq: Once | INTRAMUSCULAR | Status: AC
  Administered 2016-06-03: 0.25 mg via INTRAVENOUS

## 2016-06-03 MED ORDER — SODIUM CHLORIDE 0.9 % IV SOLN
20.0000 mg | Freq: Once | INTRAVENOUS | Status: AC
  Administered 2016-06-03: 20 mg via INTRAVENOUS
  Filled 2016-06-03: qty 2

## 2016-06-03 MED ORDER — PROCHLORPERAZINE MALEATE 10 MG PO TABS
ORAL_TABLET | ORAL | Status: AC
  Filled 2016-06-03: qty 1

## 2016-06-03 MED ORDER — SODIUM CHLORIDE 0.9 % IV SOLN
Freq: Once | INTRAVENOUS | Status: AC
  Administered 2016-06-03: 10:00:00 via INTRAVENOUS

## 2016-06-03 MED ORDER — HEPARIN SOD (PORK) LOCK FLUSH 100 UNIT/ML IV SOLN
500.0000 [IU] | Freq: Once | INTRAVENOUS | Status: AC | PRN
  Administered 2016-06-03: 500 [IU]
  Filled 2016-06-03: qty 5

## 2016-06-03 MED ORDER — DIPHENHYDRAMINE HCL 50 MG/ML IJ SOLN
INTRAMUSCULAR | Status: AC
  Filled 2016-06-03: qty 1

## 2016-06-03 MED ORDER — PROCHLORPERAZINE EDISYLATE 5 MG/ML IJ SOLN
10.0000 mg | Freq: Once | INTRAMUSCULAR | Status: AC
Start: 2016-06-03 — End: 2016-06-03
  Administered 2016-06-03: 10 mg via INTRAVENOUS

## 2016-06-03 MED ORDER — PROCHLORPERAZINE EDISYLATE 5 MG/ML IJ SOLN
INTRAMUSCULAR | Status: AC
  Filled 2016-06-03: qty 2

## 2016-06-03 MED ORDER — CARBOPLATIN CHEMO INJECTION 450 MG/45ML
378.5000 mg | Freq: Once | INTRAVENOUS | Status: AC
  Administered 2016-06-03: 380 mg via INTRAVENOUS
  Filled 2016-06-03: qty 38

## 2016-06-03 MED ORDER — SODIUM CHLORIDE 0.9 % IV SOLN
175.0000 mg/m2 | Freq: Once | INTRAVENOUS | Status: AC
  Administered 2016-06-03: 300 mg via INTRAVENOUS
  Filled 2016-06-03: qty 50

## 2016-06-03 MED ORDER — LORAZEPAM 1 MG PO TABS: 0.5000 mg | ORAL_TABLET | Freq: Once | ORAL | Status: DC | PRN

## 2016-06-03 MED ORDER — FAMOTIDINE IN NACL 20-0.9 MG/50ML-% IV SOLN
INTRAVENOUS | Status: AC
  Filled 2016-06-03: qty 50

## 2016-06-03 MED ORDER — FAMOTIDINE IN NACL 20-0.9 MG/50ML-% IV SOLN
20.0000 mg | Freq: Once | INTRAVENOUS | Status: AC
  Administered 2016-06-03: 20 mg via INTRAVENOUS

## 2016-06-03 MED ORDER — SODIUM CHLORIDE 0.9% FLUSH
10.0000 mL | INTRAVENOUS | Status: DC | PRN
  Administered 2016-06-03: 10 mL
  Filled 2016-06-03: qty 10

## 2016-06-03 NOTE — Patient Instructions (Signed)
[facility] Discharge Instructions for Patients Receiving Chemotherapy  Today you received the following chemotherapy agents Taxol/Carboplatin  To help prevent nausea and vomiting after your treatment, we encourage you to take your nausea medication    If you develop nausea and vomiting that is not controlled by your nausea medication, call the clinic.   BELOW ARE SYMPTOMS THAT SHOULD BE REPORTED IMMEDIATELY:  *FEVER GREATER THAN 100.5 F  *CHILLS WITH OR WITHOUT FEVER  NAUSEA AND VOMITING THAT IS NOT CONTROLLED WITH YOUR NAUSEA MEDICATION  *UNUSUAL SHORTNESS OF BREATH  *UNUSUAL BRUISING OR BLEEDING  TENDERNESS IN MOUTH AND THROAT WITH OR WITHOUT PRESENCE OF ULCERS  *URINARY PROBLEMS  *BOWEL PROBLEMS  UNUSUAL RASH Items with * indicate a potential emergency and should be followed up as soon as possible.  Feel free to call the clinic you have any questions or concerns. The clinic phone number is [phone].  Please show the CHEMO ALERT CARD at check-in to the Emergency Department and triage nurse.   

## 2016-06-03 NOTE — Progress Notes (Unsigned)
Pt was scheduled in this nurses section for 0900 flush appt and then with another nurse at 0930 for infusion.  Other nurse able to access, flush and treat.  Pt was not seen by this nurse.

## 2016-06-04 ENCOUNTER — Ambulatory Visit (HOSPITAL_BASED_OUTPATIENT_CLINIC_OR_DEPARTMENT_OTHER): Payer: Medicare Other

## 2016-06-04 VITALS — BP 139/64 | HR 77 | Temp 98.3°F

## 2016-06-04 DIAGNOSIS — C5702 Malignant neoplasm of left fallopian tube: Secondary | ICD-10-CM | POA: Diagnosis present

## 2016-06-04 DIAGNOSIS — Z95828 Presence of other vascular implants and grafts: Secondary | ICD-10-CM

## 2016-06-04 DIAGNOSIS — Z5189 Encounter for other specified aftercare: Secondary | ICD-10-CM | POA: Diagnosis not present

## 2016-06-04 MED ORDER — ALTEPLASE 2 MG IJ SOLR
2.0000 mg | Freq: Once | INTRAMUSCULAR | Status: DC | PRN
  Filled 2016-06-04: qty 2

## 2016-06-04 MED ORDER — TBO-FILGRASTIM 300 MCG/0.5ML ~~LOC~~ SOSY
300.0000 ug | PREFILLED_SYRINGE | Freq: Once | SUBCUTANEOUS | Status: AC
  Administered 2016-06-04: 300 ug via SUBCUTANEOUS

## 2016-06-04 NOTE — Patient Instructions (Signed)

## 2016-06-06 ENCOUNTER — Ambulatory Visit (HOSPITAL_BASED_OUTPATIENT_CLINIC_OR_DEPARTMENT_OTHER): Payer: Medicare Other

## 2016-06-06 VITALS — BP 100/65 | HR 72 | Temp 98.1°F

## 2016-06-06 DIAGNOSIS — Z95828 Presence of other vascular implants and grafts: Secondary | ICD-10-CM

## 2016-06-06 DIAGNOSIS — Z5189 Encounter for other specified aftercare: Secondary | ICD-10-CM

## 2016-06-06 DIAGNOSIS — C5702 Malignant neoplasm of left fallopian tube: Secondary | ICD-10-CM

## 2016-06-06 MED ORDER — TBO-FILGRASTIM 300 MCG/0.5ML ~~LOC~~ SOSY
300.0000 ug | PREFILLED_SYRINGE | Freq: Once | SUBCUTANEOUS | Status: AC
  Administered 2016-06-06: 300 ug via SUBCUTANEOUS
  Filled 2016-06-06: qty 0.5

## 2016-06-06 NOTE — Patient Instructions (Signed)

## 2016-06-07 ENCOUNTER — Ambulatory Visit (HOSPITAL_BASED_OUTPATIENT_CLINIC_OR_DEPARTMENT_OTHER): Payer: Medicare Other

## 2016-06-07 VITALS — BP 117/64 | HR 87 | Temp 98.1°F | Resp 18

## 2016-06-07 DIAGNOSIS — Z5189 Encounter for other specified aftercare: Secondary | ICD-10-CM

## 2016-06-07 DIAGNOSIS — Z95828 Presence of other vascular implants and grafts: Secondary | ICD-10-CM

## 2016-06-07 DIAGNOSIS — C5702 Malignant neoplasm of left fallopian tube: Secondary | ICD-10-CM

## 2016-06-07 MED ORDER — TBO-FILGRASTIM 300 MCG/0.5ML ~~LOC~~ SOSY
300.0000 ug | PREFILLED_SYRINGE | Freq: Once | SUBCUTANEOUS | Status: AC
  Administered 2016-06-07: 300 ug via SUBCUTANEOUS
  Filled 2016-06-07: qty 0.5

## 2016-06-07 NOTE — Patient Instructions (Signed)

## 2016-06-10 ENCOUNTER — Other Ambulatory Visit: Payer: Medicare Other

## 2016-06-10 ENCOUNTER — Ambulatory Visit: Payer: Medicare Other

## 2016-06-13 ENCOUNTER — Other Ambulatory Visit: Payer: Medicare Other

## 2016-06-13 ENCOUNTER — Ambulatory Visit: Payer: Medicare Other | Admitting: Oncology

## 2016-06-17 ENCOUNTER — Other Ambulatory Visit: Payer: Medicare Other

## 2016-06-17 ENCOUNTER — Ambulatory Visit: Payer: Medicare Other

## 2016-06-18 ENCOUNTER — Ambulatory Visit: Payer: Medicare Other

## 2016-06-20 ENCOUNTER — Other Ambulatory Visit: Payer: Self-pay | Admitting: Oncology

## 2016-06-20 ENCOUNTER — Encounter: Payer: Self-pay | Admitting: Oncology

## 2016-06-20 NOTE — Progress Notes (Signed)
Medical Oncology  Scheduling message sent as follows:  granix injection 9-9, 9-11, 9-12, 9-30, 10-2, 10-3  LL 9-18 at 1:30 + lab from [facility] or peripheral whichever patient prefers.  See separate POF to move Jessica Johnston to [place] to open this spot for active chemo patient.  LL ~ 10-16 + lab from [facility] or peripheral whichever she prefers  Dr Denman George ~ end of Oct. Please be sure gyn onc knows to set up this appointment.    Godfrey Pick, MD

## 2016-06-21 ENCOUNTER — Other Ambulatory Visit: Payer: Self-pay | Admitting: Oncology

## 2016-06-21 ENCOUNTER — Encounter: Payer: Self-pay | Admitting: Oncology

## 2016-06-21 ENCOUNTER — Ambulatory Visit (HOSPITAL_BASED_OUTPATIENT_CLINIC_OR_DEPARTMENT_OTHER): Payer: Medicare Other | Admitting: Oncology

## 2016-06-21 ENCOUNTER — Telehealth: Payer: Self-pay | Admitting: Oncology

## 2016-06-21 ENCOUNTER — Other Ambulatory Visit (HOSPITAL_BASED_OUTPATIENT_CLINIC_OR_DEPARTMENT_OTHER): Payer: Medicare Other

## 2016-06-21 ENCOUNTER — Ambulatory Visit: Payer: Medicare Other

## 2016-06-21 VITALS — BP 128/72 | HR 71 | Temp 97.7°F | Resp 18 | Ht 64.0 in | Wt 136.9 lb

## 2016-06-21 DIAGNOSIS — D701 Agranulocytosis secondary to cancer chemotherapy: Secondary | ICD-10-CM

## 2016-06-21 DIAGNOSIS — G620 Drug-induced polyneuropathy: Secondary | ICD-10-CM | POA: Diagnosis not present

## 2016-06-21 DIAGNOSIS — C5702 Malignant neoplasm of left fallopian tube: Secondary | ICD-10-CM

## 2016-06-21 DIAGNOSIS — T451X5A Adverse effect of antineoplastic and immunosuppressive drugs, initial encounter: Secondary | ICD-10-CM

## 2016-06-21 DIAGNOSIS — C5700 Malignant neoplasm of unspecified fallopian tube: Secondary | ICD-10-CM

## 2016-06-21 LAB — CBC WITH DIFFERENTIAL/PLATELET
BASO%: 0.9 % (ref 0.0–2.0)
BASOS ABS: 0 10*3/uL (ref 0.0–0.1)
EOS%: 1.8 % (ref 0.0–7.0)
Eosinophils Absolute: 0.1 10*3/uL (ref 0.0–0.5)
HEMATOCRIT: 37.4 % (ref 34.8–46.6)
HEMOGLOBIN: 12.4 g/dL (ref 11.6–15.9)
LYMPH#: 1 10*3/uL (ref 0.9–3.3)
LYMPH%: 32.8 % (ref 14.0–49.7)
MCH: 29.9 pg (ref 25.1–34.0)
MCHC: 33.1 g/dL (ref 31.5–36.0)
MCV: 90.3 fL (ref 79.5–101.0)
MONO#: 0.5 10*3/uL (ref 0.1–0.9)
MONO%: 15.2 % — AB (ref 0.0–14.0)
NEUT#: 1.5 10*3/uL (ref 1.5–6.5)
NEUT%: 49.3 % (ref 38.4–76.8)
Platelets: 245 10*3/uL (ref 145–400)
RBC: 4.14 10*6/uL (ref 3.70–5.45)
RDW: 15.8 % — AB (ref 11.2–14.5)
WBC: 3 10*3/uL — AB (ref 3.9–10.3)

## 2016-06-21 LAB — COMPREHENSIVE METABOLIC PANEL
ALBUMIN: 3.5 g/dL (ref 3.5–5.0)
ALK PHOS: 76 U/L (ref 40–150)
ALT: 19 U/L (ref 0–55)
AST: 15 U/L (ref 5–34)
Anion Gap: 11 mEq/L (ref 3–11)
BUN: 7.6 mg/dL (ref 7.0–26.0)
CHLORIDE: 107 meq/L (ref 98–109)
CO2: 25 mEq/L (ref 22–29)
CREATININE: 0.7 mg/dL (ref 0.6–1.1)
Calcium: 9.3 mg/dL (ref 8.4–10.4)
EGFR: 87 mL/min/{1.73_m2} — ABNORMAL LOW (ref >90)
GLUCOSE: 71 mg/dL (ref 70–140)
POTASSIUM: 4.3 meq/L (ref 3.5–5.1)
SODIUM: 142 meq/L (ref 136–145)
Total Bilirubin: 0.47 mg/dL (ref 0.20–1.20)
Total Protein: 6.7 g/dL (ref 6.4–8.3)

## 2016-06-21 NOTE — Telephone Encounter (Signed)
appt made and avs printed °

## 2016-06-21 NOTE — Progress Notes (Signed)
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 Cycle 4 was delayed a week for ANC 1.2, given on 06-03-16 with granix increased to 3 doses from previous 2 doses (days 2,4,5).    Plan is to repeat scans and see Dr Denman George after cycle 6.    Patient    PAC by IR 03-15-16 CA 125 preoperatively 656 Genetics testing sent 04-25-16, custom panel to [facility] (melanoma and fallopian ca) negative.   ONCOLOGIC HISTORY Patient had history of benign right ovarian cyst and uterine fibroids, for which she saw Dr Josephina Shih in 08-2014, CA 125 slightly elevated then which was felt due to fibroids. She developed mild RUQ discomfort 08-2015, which she noticed mostly when taking a deep breath. She had CT AP by Dr Joylene Draft 4-12-17which showed carcinomatosis, with right adnexal lesion 2.8 x 2.7 x 3.5 cm without solid component, unremarkable left ovary, no ascites, no abdominal or pelvic adenopathy. CA 125 was 656 on 02-08-16. 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 CXR 02-16-16 small amount of right pleural thickening or fluid and linear atelectasis or scarring left lung base.She had exploratory laparotomy with TAH, BSO, omentectomy and radical debulking by Dr Denman George on 02-18-16, intraoperative findings remarkable for 10 cm omental cake, milial studding 1 mm pelvic surfaces, right colic gutter, right diaphragm and liver, mesentery of transverse colon and rectum, 3 cm cystic mass in right ovary. Procedure included argon beam coagulation of miliary tumor implants, with no residual disease apparent at  completion. Surgical pathology [id number]) high grade serous carcinoma of left fallopian tube involving omentum, with benign serous right ovarian cyst 3.5 cm. Post operatively she has slow recovery of bowel function, but was stable for DC on 02-22-16. Case was presented at gyn oncology multidisciplinary conference on 03-07-16, with recommendation for 6 cycles of adjuvant carboplatin taxol. Cycle 1 carboplatin taxol was given 03-18-16, neutropenic with ANC 0.2 on day 14 cycle 1. Cycle 2 was delayed with elevated AP, AST, ALT of unclear etiology, given as carboplatin only on 04-15-16 also due to the LFT changes. With possibility that LFT elevations were related to zofran, that medication was discontinued and patient tolerated carboplatin + taxol with no difficulty and normal LFTs for cycle 3.    Review of systems as above, also:  Remainder of 10 point Review of Systems negative.  Objective:  Vital signs in last 24 hours:  There were no vitals taken for this visit.  Alert, oriented and appropriate. Ambulatory without assistance difficulty.  Alopecia  HEENT:PERRL, sclerae not icteric. Oral mucosa moist without lesions, posterior pharynx clear.  Neck supple. No JVD.  Lymphatics:no cervical,suraclavicular, axillary or inguinal adenopathy Resp: clear to auscultation bilaterally and normal percussion bilaterally Cardio: regular rate and rhythm. No gallop. GI: soft, nontender, not distended, no mass or organomegaly. Normally active bowel sounds. Surgical incision not remarkable. Musculoskeletal/ Extremities: without pitting edema, cords, tenderness Neuro: no peripheral neuropathy. Otherwise nonfocal Skin without rash, ecchymosis, petechiae Breasts: without dominant mass, skin or nipple findings. Axillae benign. Portacath-without erythema or tenderness  Lab Results:  Results for orders placed or performed in visit on 06/21/16  CBC with Differential  Result  Value Ref Range   WBC 3.0 (L) 3.9 -  10.3 10e3/uL   NEUT# 1.5 1.5 - 6.5 10e3/uL   HGB 12.4 11.6 - 15.9 g/dL   HCT 37.4 34.8 - 46.6 %   Platelets 245 145 - 400 10e3/uL   MCV 90.3 79.5 - 101.0 fL   MCH 29.9 25.1 - 34.0 pg   MCHC 33.1 31.5 - 36.0 g/dL   RBC 4.14 3.70 - 5.45 10e6/uL   RDW 15.8 (H) 11.2 - 14.5 %   lymph# 1.0 0.9 - 3.3 10e3/uL   MONO# 0.5 0.1 - 0.9 10e3/uL   Eosinophils Absolute 0.1 0.0 - 0.5 10e3/uL   Basophils Absolute 0.0 0.0 - 0.1 10e3/uL   NEUT% 49.3 38.4 - 76.8 %   LYMPH% 32.8 14.0 - 49.7 %   MONO% 15.2 (H) 0.0 - 14.0 %   EOS% 1.8 0.0 - 7.0 %   BASO% 0.9 0.0 - 2.0 %     Studies/Results:  No results found.  Medications: I have reviewed the patient's current medications.  DISCUSSION  Assessment/Plan:  1. 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 Neutropenic day 14 cycle 1. Cycle 2 delayed from 04-08-16 with elevated LFTs, given as [place] only on 04-15-16. Cycle 3 carbo taxol tolerated without problems (LFTs normal off of zofran). Cycle 4 delayed x 1 week for leukopenia, granix increased.  Genetics testing normal 04-25-16, fallopian ca and also multiple melanomas. 2. Elevated AP, AST, ALT variable and preceded start of chemo. Held taxol cycle 2 with this problem. LFTs normal since zofran held 3.history of five melanomas: all early stage and treated surgically. Follows every 6 months with dermatologist Dr Linus Galas. Punch biopsy of scalp healing well, will need MOHs for basal cell there, after chemo completed.   4.benign right ovarian cyst: evaluated 08-2014 at which time there was no evidence of malignancy, and confirmed by pathology from this surgery 5.elevated cholesterol followed by Dr Joylene Draft 6.diverticulosis by colonoscopy, no apparent symptoms 7.degenerative arthritis hands and knees, mild 8.history of migraine HAs, not exacerbated  by zofran  9.social stress with death of grandchild by suicide a year ago  83.advance directives in place       Evlyn Clines, MD   06/21/2016, 8:38 AM

## 2016-06-22 ENCOUNTER — Telehealth: Payer: Self-pay

## 2016-06-22 ENCOUNTER — Encounter: Payer: Self-pay | Admitting: Oncology

## 2016-06-22 LAB — CA 125: CANCER ANTIGEN (CA) 125: 16.6 U/mL (ref 0.0–38.1)

## 2016-06-22 NOTE — Telephone Encounter (Signed)
LM in Ms. Easton's VM stating the results of the CA-125 as noted below by Dr. Marko Plume.

## 2016-06-22 NOTE — Telephone Encounter (Signed)
-----   Message from Gordy Levan, MD sent at 06/22/2016  8:31 AM EDT ----- Labs seen and need follow up: please let her know CA 125 marker down to 16

## 2016-06-22 NOTE — Progress Notes (Signed)
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 She had cycle 4 carbo taxol on 06-03-16, with granix on days 2,3,4.  Patient continues to tolerate chemotherapy well, which she feels is partially due to careful attention to good diet. 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 She has had no nausea and bowels move well with present diet. She does not have debilitating aches from full dose taxol or the granix. Energy is adequate for regular activities, tho she is still avoiding heavy lifting. PAC functions well. She denies SOB, abdominal or pelvic discomfort, LE swelling, any bleeding, any fever or symptoms of infection.  Remainder of 10 point Review of Systems negative.   PAC by IR 03-15-16 CA 125 preoperatively 656 Genetics testing sent 04-25-16, custom panel to [facility] (melanoma and fallopian ca) negative  ONCOLOGIC HISTORY Patient had history of benign right ovarian cyst and uterine fibroids, for which she saw Dr Josephina Shih in 08-2014, CA 125 slightly elevated then which was felt due to fibroids. She developed mild RUQ discomfort 08-2015, which she noticed mostly when taking a deep breath. She had CT AP by Dr Joylene Draft 4-12-17which showed carcinomatosis, with right adnexal lesion 2.8 x 2.7 x 3.5 cm without solid component, unremarkable left ovary, no ascites, no abdominal or pelvic adenopathy. CA 125 was 656 on 02-08-16. 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 CXR 02-16-16 small amount of right pleural thickening or fluid  and linear atelectasis or scarring left lung base.She had exploratory laparotomy with TAH, BSO, omentectomy and radical debulking by Dr Denman George on 02-18-16, intraoperative findings remarkable for 10 cm omental cake, milial studding 1 mm pelvic surfaces, right colic gutter, right diaphragm and liver, mesentery of transverse colon and rectum, 3 cm cystic mass in right ovary. Procedure included argon beam coagulation of miliary tumor implants, with no residual disease apparent at completion. Surgical pathology [id number]) high grade serous carcinoma of left fallopian tube involving omentum, with benign serous right ovarian cyst 3.5 cm. Post operatively she has slow recovery of bowel function, but was stable for DC on 02-22-16. Case was presented at gyn oncology multidisciplinary conference on 03-07-16, with recommendation for 6 cycles of adjuvant carboplatin taxol. Cycle 1 carboplatin taxol was given 03-18-16, neutropenic with ANC 0.2 on day 14 cycle 1. Cycle 2 was delayed with elevated AP, AST, ALT of unclear etiology, given as carboplatin only on 04-15-16 also due to the LFT changes. With possibility that LFT elevations were related to zofran, that medication was discontinued and patient tolerated carboplatin + taxol with no difficulty and normal LFTs for cycle 3.   Objective:  Vital signs in last 24 hours:  BP 128/72 (BP Location: Left Arm, Patient Position: Sitting)   Pulse 71   Temp 97.7 F (36.5 C) (Oral)   Resp 18   Ht _0  (1.626 m)   Wt 136 lb 14.4 oz (62.1 kg)   SpO2 100%   BMI 23.50 kg/m  Weight down 2 lbs Alert, oriented and appropriate. Ambulatory without difficulty.  Alopecia  HEENT:PERRL, sclerae not icteric. Oral mucosa moist without lesions, posterior pharynx clear.  Neck supple. No JVD.  Lymphatics:no cervical,supraclavicular or inguinal adenopathy Resp: clear to auscultation bilaterally and normal percussion bilaterally Cardio: regular rate and rhythm. No gallop. GI: soft,  nontender, not distended, no mass or organomegaly. Normally active bowel sounds. Surgical incision not remarkable. Musculoskeletal/ Extremities: without pitting edema, cords, tenderness Neuro: minimal peripheral neuropathy tips of fingers only. Otherwise nonfocal. PSYCH appropriate mood and affect Skin without rash, ecchymosis, petechiae. No obviously concerning hyperpigmented lesions trunk or extremities Portacath-without erythema or tenderness  Lab Results:  Results for orders placed or performed in visit on 06/21/16  CBC with Differential  Result Value Ref Range   WBC 3.0 (L) 3.9 - 10.3 10e3/uL   NEUT# 1.5 1.5 - 6.5 10e3/uL   HGB 12.4 11.6 - 15.9 g/dL   HCT 37.4 34.8 - 46.6 %   Platelets 245 145 - 400 10e3/uL   MCV 90.3 79.5 - 101.0 fL   MCH 29.9 25.1 - 34.0 pg   MCHC 33.1 31.5 - 36.0 g/dL   RBC 4.14 3.70 - 5.45 10e6/uL   RDW 15.8 (H) 11.2 - 14.5 %   lymph# 1.0 0.9 - 3.3 10e3/uL   MONO# 0.5 0.1 - 0.9 10e3/uL   Eosinophils Absolute 0.1 0.0 - 0.5 10e3/uL   Basophils Absolute 0.0 0.0 - 0.1 10e3/uL   NEUT% 49.3 38.4 - 76.8 %   LYMPH% 32.8 14.0 - 49.7 %   MONO% 15.2 (H) 0.0 - 14.0 %   EOS% 1.8 0.0 - 7.0 %   BASO% 0.9 0.0 - 2.0 %  Comprehensive metabolic panel  Result Value Ref Range   Sodium 142 136 - 145 mEq/L   Potassium 4.3 3.5 - 5.1 mEq/L   Chloride 107 98 - 109 mEq/L   CO2 25 22 - 29 mEq/L   Glucose 71 70 - 140 mg/dl   BUN 7.6 7.0 - 26.0 mg/dL   Creatinine 0.7 0.6 - 1.1 mg/dL   Total Bilirubin 0.47 0.20 - 1.20 mg/dL   Alkaline Phosphatase 76 40 - 150 U/L   AST 15 5 - 34 U/L   ALT 19 0 - 55 U/L   Total Protein 6.7 6.4 - 8.3 g/dL   Albumin 3.5 3.5 - 5.0 g/dL   Calcium 9.3 8.4 - 10.4 mg/dL   Anion Gap 11 3 - 11 mEq/L   EGFR 87 (L) >90 ml/min/1.73 m2  CA 125  Result Value Ref Range   Cancer Antigen (CA) 125 16.6 0.0 - 38.1 U/mL     Studies/Results:  No results found.    Medications: I have reviewed the patient's current medications.  Should have flu vaccine  mid to late Oct, at least a few weeks after chemo completes (anticipated cycle 6 on  07-15-16)  DISCUSSION Patient is in agreement with continuing treatment as planned. Discussed taxol peripheral neuropathy and will continue to follow this, but no dose changes necessary for taxol now  She is due bone density test by Dr Joylene Draft, prefers to delay this until after chemo completed and she will call that office.  Assessment/Plan: 1. 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 Neutropenic day 14 cycle 1. Cycle 2 delayed from 04-08-16 with elevated LFTs, given as [place] only on 04-15-16. Cycle 3 carbo taxol tolerated without problems , LFTs normal off of zofran. Continuing granix for documented chemo neutropenia Genetics testing normal 04-25-16, fallopian ca and melanomas. 2.  Elevated AP, AST, ALT variable and preceded start of chemo, seems secondary to zofran with surgery and first chemo. 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 Follows every 6 months with dermatologist Dr Linus Galas. Punch biopsy of scalp healing well, will need MOHs for basal cell there, after chemo completed.   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 PAC in , functioning well    All questions answered. Chemo and granix orders confirmed. Time spent 25 min including >50% counseling and coordination of care. Route Dr Joylene Draft    Evlyn Clines, MD

## 2016-06-24 ENCOUNTER — Ambulatory Visit (HOSPITAL_BASED_OUTPATIENT_CLINIC_OR_DEPARTMENT_OTHER): Payer: Medicare Other

## 2016-06-24 ENCOUNTER — Other Ambulatory Visit: Payer: Medicare Other

## 2016-06-24 VITALS — BP 100/64 | HR 86 | Temp 98.0°F

## 2016-06-24 DIAGNOSIS — C5702 Malignant neoplasm of left fallopian tube: Secondary | ICD-10-CM | POA: Diagnosis not present

## 2016-06-24 DIAGNOSIS — Z5111 Encounter for antineoplastic chemotherapy: Secondary | ICD-10-CM

## 2016-06-24 MED ORDER — PROCHLORPERAZINE EDISYLATE 5 MG/ML IJ SOLN: 10.0000 mg | Freq: Once | INTRAMUSCULAR | Status: DC

## 2016-06-24 MED ORDER — SODIUM CHLORIDE 0.9% FLUSH
10.0000 mL | INTRAVENOUS | Status: DC | PRN
  Administered 2016-06-24: 10 mL
  Filled 2016-06-24: qty 10

## 2016-06-24 MED ORDER — HEPARIN SOD (PORK) LOCK FLUSH 100 UNIT/ML IV SOLN: 500.0000 [IU] | Freq: Once | INTRAVENOUS | Status: DC | PRN

## 2016-06-24 MED ORDER — FAMOTIDINE IN NACL 20-0.9 MG/50ML-% IV SOLN: 20.0000 mg | Freq: Once | INTRAVENOUS | Status: DC

## 2016-06-24 MED ORDER — DIPHENHYDRAMINE HCL 50 MG/ML IJ SOLN
25.0000 mg | Freq: Once | INTRAMUSCULAR | Status: AC
  Administered 2016-06-24: 25 mg via INTRAVENOUS

## 2016-06-24 MED ORDER — DIPHENHYDRAMINE HCL 50 MG/ML IJ SOLN
INTRAMUSCULAR | Status: AC
  Filled 2016-06-24: qty 1

## 2016-06-24 MED ORDER — SODIUM CHLORIDE 0.9 % IV SOLN
378.5000 mg | Freq: Once | INTRAVENOUS | Status: AC
  Administered 2016-06-24: 380 mg via INTRAVENOUS
  Filled 2016-06-24: qty 38

## 2016-06-24 MED ORDER — SODIUM CHLORIDE 0.9% FLUSH: 10.0000 mL | INTRAVENOUS | Status: DC | PRN

## 2016-06-24 MED ORDER — PROCHLORPERAZINE EDISYLATE 5 MG/ML IJ SOLN
INTRAMUSCULAR | Status: AC
Start: 2016-06-24 — End: 2016-06-24
  Filled 2016-06-24: qty 2

## 2016-06-24 MED ORDER — PROCHLORPERAZINE EDISYLATE 5 MG/ML IJ SOLN
10.0000 mg | Freq: Once | INTRAMUSCULAR | Status: AC
  Administered 2016-06-24: 10 mg via INTRAVENOUS

## 2016-06-24 MED ORDER — SODIUM CHLORIDE 0.9 % IV SOLN: Freq: Once | INTRAVENOUS | Status: DC

## 2016-06-24 MED ORDER — SODIUM CHLORIDE 0.9 % IV SOLN
20.0000 mg | Freq: Once | INTRAVENOUS | Status: AC
  Administered 2016-06-24: 20 mg via INTRAVENOUS
  Filled 2016-06-24: qty 2

## 2016-06-24 MED ORDER — DIPHENHYDRAMINE HCL 50 MG/ML IJ SOLN: 25.0000 mg | Freq: Once | INTRAMUSCULAR | Status: DC

## 2016-06-24 MED ORDER — SODIUM CHLORIDE 0.9 % IV SOLN: 175.0000 mg/m2 | Freq: Once | INTRAVENOUS | Status: DC

## 2016-06-24 MED ORDER — SODIUM CHLORIDE 0.9 % IV SOLN
175.0000 mg/m2 | Freq: Once | INTRAVENOUS | Status: AC
  Administered 2016-06-24: 300 mg via INTRAVENOUS
  Filled 2016-06-24: qty 50

## 2016-06-24 MED ORDER — FAMOTIDINE IN NACL 20-0.9 MG/50ML-% IV SOLN
INTRAVENOUS | Status: AC
  Filled 2016-06-24: qty 50

## 2016-06-24 MED ORDER — SODIUM CHLORIDE 0.9 % IV SOLN: 20.0000 mg | Freq: Once | INTRAVENOUS | Status: DC

## 2016-06-24 MED ORDER — SODIUM CHLORIDE 0.9 % IV SOLN: 378.5000 mg | Freq: Once | INTRAVENOUS | Status: DC

## 2016-06-24 MED ORDER — FAMOTIDINE IN NACL 20-0.9 MG/50ML-% IV SOLN
20.0000 mg | Freq: Once | INTRAVENOUS | Status: AC
  Administered 2016-06-24: 20 mg via INTRAVENOUS

## 2016-06-24 MED ORDER — HEPARIN SOD (PORK) LOCK FLUSH 100 UNIT/ML IV SOLN
500.0000 [IU] | Freq: Once | INTRAVENOUS | Status: AC | PRN
Start: 2016-06-24 — End: 2016-06-24
  Administered 2016-06-24: 500 [IU]
  Filled 2016-06-24: qty 5

## 2016-06-24 NOTE — Addendum Note (Signed)
Addended by: Ricarda Frame C on: 06/24/2016 10:10 AM   Modules accepted: Orders

## 2016-06-24 NOTE — Patient Instructions (Signed)
[facility] Discharge Instructions for Patients Receiving Chemotherapy  Today you received the following chemotherapy agents Taxol and Carboplatin. To help prevent nausea and vomiting after your treatment, we encourage you to take your nausea medication as directed.  If you develop nausea and vomiting that is not controlled by your nausea medication, call the clinic.   BELOW ARE SYMPTOMS THAT SHOULD BE REPORTED IMMEDIATELY:  *FEVER GREATER THAN 100.5 F  *CHILLS WITH OR WITHOUT FEVER  NAUSEA AND VOMITING THAT IS NOT CONTROLLED WITH YOUR NAUSEA MEDICATION  *UNUSUAL SHORTNESS OF BREATH  *UNUSUAL BRUISING OR BLEEDING  TENDERNESS IN MOUTH AND THROAT WITH OR WITHOUT PRESENCE OF ULCERS  *URINARY PROBLEMS  *BOWEL PROBLEMS  UNUSUAL RASH Items with * indicate a potential emergency and should be followed up as soon as possible.  Feel free to call the clinic you have any questions or concerns. The clinic phone number is [phone].  Please show the CHEMO ALERT CARD at check-in to the Emergency Department and triage nurse.    

## 2016-06-25 ENCOUNTER — Ambulatory Visit (HOSPITAL_BASED_OUTPATIENT_CLINIC_OR_DEPARTMENT_OTHER): Payer: Medicare Other

## 2016-06-25 VITALS — BP 145/63 | HR 68 | Temp 98.2°F | Resp 16

## 2016-06-25 DIAGNOSIS — D701 Agranulocytosis secondary to cancer chemotherapy: Secondary | ICD-10-CM | POA: Diagnosis present

## 2016-06-25 DIAGNOSIS — Z95828 Presence of other vascular implants and grafts: Secondary | ICD-10-CM

## 2016-06-25 DIAGNOSIS — C5702 Malignant neoplasm of left fallopian tube: Secondary | ICD-10-CM

## 2016-06-25 MED ORDER — TBO-FILGRASTIM 300 MCG/0.5ML ~~LOC~~ SOSY
300.0000 ug | PREFILLED_SYRINGE | Freq: Once | SUBCUTANEOUS | Status: AC
  Administered 2016-06-25: 300 ug via SUBCUTANEOUS

## 2016-06-25 NOTE — Patient Instructions (Signed)

## 2016-06-27 ENCOUNTER — Ambulatory Visit (HOSPITAL_BASED_OUTPATIENT_CLINIC_OR_DEPARTMENT_OTHER): Payer: Medicare Other

## 2016-06-27 VITALS — BP 105/60 | HR 75 | Temp 97.8°F | Resp 18

## 2016-06-27 DIAGNOSIS — C5702 Malignant neoplasm of left fallopian tube: Secondary | ICD-10-CM | POA: Diagnosis present

## 2016-06-27 DIAGNOSIS — Z5189 Encounter for other specified aftercare: Secondary | ICD-10-CM | POA: Diagnosis not present

## 2016-06-27 DIAGNOSIS — Z95828 Presence of other vascular implants and grafts: Secondary | ICD-10-CM

## 2016-06-27 MED ORDER — TBO-FILGRASTIM 300 MCG/0.5ML ~~LOC~~ SOSY
300.0000 ug | PREFILLED_SYRINGE | Freq: Once | SUBCUTANEOUS | Status: AC
  Administered 2016-06-27: 300 ug via SUBCUTANEOUS
  Filled 2016-06-27: qty 0.5

## 2016-06-27 NOTE — Patient Instructions (Signed)

## 2016-06-28 ENCOUNTER — Other Ambulatory Visit (HOSPITAL_COMMUNITY): Payer: Self-pay

## 2016-06-28 ENCOUNTER — Other Ambulatory Visit: Payer: Self-pay

## 2016-06-28 ENCOUNTER — Ambulatory Visit: Payer: Medicare Other

## 2016-06-28 ENCOUNTER — Emergency Department (HOSPITAL_COMMUNITY): Payer: Medicare Other

## 2016-06-28 ENCOUNTER — Observation Stay (HOSPITAL_COMMUNITY)
Admission: EM | Admit: 2016-06-28 | Discharge: 2016-06-29 | Disposition: A | Discharge status: Home / Self Care | Payer: Medicare Other | Attending: Internal Medicine | Admitting: Internal Medicine

## 2016-06-28 ENCOUNTER — Encounter (HOSPITAL_COMMUNITY): Payer: Self-pay | Admitting: Emergency Medicine

## 2016-06-28 DIAGNOSIS — R42 Dizziness and giddiness: Secondary | ICD-10-CM | POA: Diagnosis not present

## 2016-06-28 DIAGNOSIS — Z9079 Acquired absence of other genital organ(s): Secondary | ICD-10-CM | POA: Diagnosis not present

## 2016-06-28 DIAGNOSIS — S0033XA Contusion of nose, initial encounter: Secondary | ICD-10-CM

## 2016-06-28 DIAGNOSIS — R404 Transient alteration of awareness: Secondary | ICD-10-CM | POA: Diagnosis not present

## 2016-06-28 DIAGNOSIS — Z85820 Personal history of malignant melanoma of skin: Secondary | ICD-10-CM | POA: Insufficient documentation

## 2016-06-28 DIAGNOSIS — Z90710 Acquired absence of both cervix and uterus: Secondary | ICD-10-CM | POA: Insufficient documentation

## 2016-06-28 DIAGNOSIS — C786 Secondary malignant neoplasm of retroperitoneum and peritoneum: Secondary | ICD-10-CM | POA: Insufficient documentation

## 2016-06-28 DIAGNOSIS — R55 Syncope and collapse: Secondary | ICD-10-CM | POA: Diagnosis not present

## 2016-06-28 DIAGNOSIS — Y92002 Bathroom of unspecified non-institutional (private) residence single-family (private) house as the place of occurrence of the external cause: Secondary | ICD-10-CM | POA: Diagnosis not present

## 2016-06-28 DIAGNOSIS — S0993XA Unspecified injury of face, initial encounter: Secondary | ICD-10-CM | POA: Diagnosis not present

## 2016-06-28 DIAGNOSIS — Z79899 Other long term (current) drug therapy: Secondary | ICD-10-CM | POA: Diagnosis not present

## 2016-06-28 DIAGNOSIS — Y93E8 Activity, other personal hygiene: Secondary | ICD-10-CM | POA: Diagnosis not present

## 2016-06-28 DIAGNOSIS — R4182 Altered mental status, unspecified: Secondary | ICD-10-CM | POA: Insufficient documentation

## 2016-06-28 DIAGNOSIS — W19XXXA Unspecified fall, initial encounter: Secondary | ICD-10-CM | POA: Insufficient documentation

## 2016-06-28 DIAGNOSIS — C5700 Malignant neoplasm of unspecified fallopian tube: Secondary | ICD-10-CM | POA: Diagnosis not present

## 2016-06-28 DIAGNOSIS — R1909 Other intra-abdominal and pelvic swelling, mass and lump: Secondary | ICD-10-CM | POA: Diagnosis present

## 2016-06-28 DIAGNOSIS — Z90722 Acquired absence of ovaries, bilateral: Secondary | ICD-10-CM | POA: Insufficient documentation

## 2016-06-28 DIAGNOSIS — S0990XA Unspecified injury of head, initial encounter: Secondary | ICD-10-CM | POA: Diagnosis not present

## 2016-06-28 LAB — URINALYSIS, ROUTINE W REFLEX MICROSCOPIC
BILIRUBIN URINE: NEGATIVE
GLUCOSE, UA: NEGATIVE mg/dL
HGB URINE DIPSTICK: NEGATIVE
Ketones, ur: NEGATIVE mg/dL
Leukocytes, UA: NEGATIVE
Nitrite: NEGATIVE
PH: 8 (ref 5.0–8.0)
Protein, ur: NEGATIVE mg/dL
SPECIFIC GRAVITY, URINE: 1.013 (ref 1.005–1.030)

## 2016-06-28 LAB — COMPREHENSIVE METABOLIC PANEL
ALT: 24 U/L (ref 14–54)
AST: 23 U/L (ref 15–41)
Albumin: 3.9 g/dL (ref 3.5–5.0)
Alkaline Phosphatase: 72 U/L (ref 38–126)
Anion gap: 7 (ref 5–15)
BUN: 13 mg/dL (ref 6–20)
CHLORIDE: 107 mmol/L (ref 101–111)
CO2: 23 mmol/L (ref 22–32)
CREATININE: 0.59 mg/dL (ref 0.44–1.00)
Calcium: 9 mg/dL (ref 8.9–10.3)
GFR calc Af Amer: >60 mL/min (ref >60)
GLUCOSE: 112 mg/dL — AB (ref 65–99)
Potassium: 4.6 mmol/L (ref 3.5–5.1)
SODIUM: 137 mmol/L (ref 135–145)
Total Bilirubin: 0.9 mg/dL (ref 0.3–1.2)
Total Protein: 6.4 g/dL — ABNORMAL LOW (ref 6.5–8.1)

## 2016-06-28 LAB — CBC WITH DIFFERENTIAL/PLATELET
Basophils Absolute: 0 10*3/uL (ref 0.0–0.1)
Basophils Relative: 0 %
EOS ABS: 0 10*3/uL (ref 0.0–0.7)
Eosinophils Relative: 0 %
HCT: 35.5 % — ABNORMAL LOW (ref 36.0–46.0)
HEMOGLOBIN: 12.1 g/dL (ref 12.0–15.0)
LYMPHS ABS: 0.6 10*3/uL — AB (ref 0.7–4.0)
Lymphocytes Relative: 7 %
MCH: 30.4 pg (ref 26.0–34.0)
MCHC: 34.1 g/dL (ref 30.0–36.0)
MCV: 89.2 fL (ref 78.0–100.0)
Monocytes Absolute: 0.2 10*3/uL (ref 0.1–1.0)
Monocytes Relative: 2 %
NEUTROS PCT: 91 %
Neutro Abs: 8.6 10*3/uL — ABNORMAL HIGH (ref 1.7–7.7)
Platelets: 185 10*3/uL (ref 150–400)
RBC: 3.98 MIL/uL (ref 3.87–5.11)
RDW: 14.4 % (ref 11.5–15.5)
WBC: 9.4 10*3/uL (ref 4.0–10.5)

## 2016-06-28 LAB — CBG MONITORING, ED: Glucose-Capillary: 111 mg/dL — ABNORMAL HIGH (ref 65–99)

## 2016-06-28 LAB — AMMONIA: AMMONIA: 18 umol/L (ref 9–35)

## 2016-06-28 LAB — TSH: TSH: 1.093 u[IU]/mL (ref 0.350–4.500)

## 2016-06-28 MED ORDER — LORATADINE 10 MG PO TABS
10.0000 mg | ORAL_TABLET | Freq: Every day | ORAL | Status: DC
  Administered 2016-06-28 – 2016-06-29 (×2): 10 mg via ORAL
  Filled 2016-06-28 (×2): qty 1

## 2016-06-28 MED ORDER — SODIUM CHLORIDE 0.9 % IV BOLUS (SEPSIS)
1000.0000 mL | Freq: Once | INTRAVENOUS | Status: AC
  Administered 2016-06-28: 1000 mL via INTRAVENOUS

## 2016-06-28 MED ORDER — SODIUM CHLORIDE 0.9% FLUSH
3.0000 mL | Freq: Two times a day (BID) | INTRAVENOUS | Status: DC
  Administered 2016-06-28: 3 mL via INTRAVENOUS

## 2016-06-28 MED ORDER — SODIUM CHLORIDE 0.9 % IV SOLN
INTRAVENOUS | Status: DC
  Administered 2016-06-28: 16:00:00 via INTRAVENOUS

## 2016-06-28 MED ORDER — ENOXAPARIN SODIUM 40 MG/0.4ML ~~LOC~~ SOLN
40.0000 mg | SUBCUTANEOUS | Status: DC
  Administered 2016-06-28: 40 mg via SUBCUTANEOUS
  Filled 2016-06-28: qty 0.4

## 2016-06-28 MED ORDER — SODIUM CHLORIDE 0.9 % IV SOLN
INTRAVENOUS | Status: DC
  Administered 2016-06-28: 12:00:00 via INTRAVENOUS

## 2016-06-28 MED ORDER — SODIUM CHLORIDE 0.9% FLUSH
10.0000 mL | INTRAVENOUS | Status: DC | PRN
  Administered 2016-06-29: 10 mL
  Filled 2016-06-28: qty 40

## 2016-06-28 NOTE — ED Notes (Addendum)
Pt ambulated with one assist to restroom.  Gait is steady

## 2016-06-28 NOTE — ED Provider Notes (Signed)
[facility] DEPT Provider Note   CSN: [id number] Arrival date & time: 06/28/16  [id number]  LEVEL 5 CAVEAT - ALTERED MENTAL STATUS   History   Chief Complaint Chief Complaint  Patient presents with  . Loss of Consciousness  . Facial Pain    HPI Jessica Johnston is a 75 y.o. female presenting from home by EMS after 2 syncopal episodes. The history is limited as the patient is currently confused. However shortly after arrival the husband arrived and provided more history. She has a history of cancer and has recently been on chemotherapy as well as receiving immune boosting infusions. She was in the bathroom when he heard a bang and found her on the floor. She stated that she needed to get back on the toilet because she was having diarrhea. When he put her back on and went back downstairs he heard another fall and she ended up hitting her face. Unsure if she was knocked out at all. The patient is currently confused off of baseline. Husband reports she was normal last night as well as this morning prior to the falls. She has not had any complaints otherwise.  HPI  Past Medical History:  Diagnosis Date  . Arthritis    arthritis -hands-knees.  . Cancer ([place])    MELANOMA. multiple x5 different sites. last 10 yrs ago.  Marland Kitchen Headache    past hx.-no in awhile.  . High cholesterol   . Melanoma ([place])   . Skin cancer    melanoma    Patient Active Problem List   Diagnosis Date Noted  . Syncope 06/28/2016  . Genetic testing 05/13/2016  . Melanoma ([place])   . Elevated LFTs 04/16/2016  . Portacath in place 04/01/2016  . Chemotherapy induced neutropenia ([place]) 04/01/2016  . Basal cell carcinoma of skin 04/01/2016  . History of melanoma 03/26/2016  . Fallopian tube carcinoma ([place]) 03/26/2016  . Port catheter in place 03/18/2016  . Poor venous access 03/11/2016  . Melanoma of skin ([place]) 03/11/2016  . History of migraine with aura 03/11/2016  . Diverticulosis of large intestine without hemorrhage  03/11/2016  . Elevated cholesterol 03/11/2016  . Fallopian tube cancer, carcinoma ([place]) 03/07/2016  . 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 Pelvic mass in female 02/18/2016  . Omental mass 02/08/2016  . Ovarian cyst, left 02/08/2016  . Abdominal mass of other site 02/08/2016  . Intramural leiomyoma of uterus 08/06/2014  . History of ovarian cyst 07/08/2014  . H/O vitamin D deficiency 07/08/2014  . Osteopenia 07/08/2014  . Vaginal atrophy 07/08/2014    Past Surgical History:  Procedure Laterality Date  . ABDOMINAL HYSTERECTOMY N/A 02/18/2016   Procedure: HYSTERECTOMY ABDOMINAL;  Surgeon: Everitt Amber, MD;  Location: [facility]S;  Service: Gynecology;  Laterality: N/A;  . DEBULKING N/A 02/18/2016   Procedure: RADICAL TUMOR DEBULKING;  Surgeon: Everitt Amber, MD;  Location: [facility];  Service: Gynecology;  Laterality: N/A;  . HYSTEROSCOPY    . LAPAROTOMY N/A 02/18/2016   Procedure: EXPLORATORY LAPAROTOMY;  Surgeon: Everitt Amber, MD;  Location: [facility];  Service: Gynecology;  Laterality: N/A;  . omental mass     Biopsy, CT guided  . OMENTECTOMY N/A 02/18/2016   Procedure: OMENTECTOMY;  Surgeon: Everitt Amber, MD;  Location: [facility];  Service: Gynecology;  Laterality: N/A;  . SALPINGOOPHORECTOMY Bilateral 02/18/2016   Procedure: BILATERAL SALPINGO OOPHORECTOMY;  Surgeon: Everitt Amber, MD;  Location: [facility];  Service: Gynecology;  Laterality: Bilateral;  . [employer]  OB History    Gravida Para Term Preterm AB Living   3 2     1 2    SAB TAB Ectopic Multiple Live Births   1               Home Medications    Prior to Admission medications   Medication Sig Start Date End Date Taking? Authorizing Provider  dexamethasone (DECADRON) 4 MG tablet Take 5 tablets (20mg ) with food 12 hours and 6 hours before chemo. 05/19/16  Yes Lennis Marion Downer, MD  lidocaine-prilocaine (EMLA) cream Apply 1 application topically as needed. Place on port 1 hour before needle sticks,  cover with plastic wrap. 03/11/16  Yes Lennis Marion Downer, MD  loratadine (CLARITIN) 10 MG tablet Take 10 mg by mouth as directed. For 5 days after chemo   Yes Historical Provider, MD  prochlorperazine (COMPAZINE) 10 MG tablet Take 1 tablet (10 mg total) by mouth every 6 (six) hours as needed for nausea or vomiting. Patient not taking: Reported on 05/19/2016 04/28/16   Gordy Levan, MD    Family History Family History  Problem Relation Age of Onset  . Colitis Sister   . Parkinsonism Maternal Grandmother   . Heart disease Paternal Grandmother   . Heart disease Paternal Grandfather   . Cancer Cousin 12    paternal first cousin  . Skin cancer Son     Social History Social History  Substance Use Topics  . Smoking status: Never Smoker  . Smokeless tobacco: Never Used  . Alcohol use 0.0 oz/week     Comment: wine 1 daily     Allergies   Review of patient's allergies indicates no known allergies.   Review of Systems Review of Systems  Unable to perform ROS: Mental status change     Physical Exam Updated Vital Signs BP 108/68   Pulse 71   Temp 99.2 F (37.3 C) (Oral)   Resp 18   Ht 5\' 4"  (1.626 m)   Wt 126 lb (57.2 kg)   SpO2 100%   BMI 21.63 kg/m   Physical Exam  Constitutional: She appears well-developed and well-nourished.  HENT:  Head: Normocephalic. Head is with abrasion.    Right Ear: External ear normal.  Left Ear: External ear normal.  Nose: No nasal septal hematoma. Epistaxis is observed.  Dried blood at tip of both nares, no obvious bleeding now or hematoma  Eyes: EOM are normal. Pupils are equal, round, and reactive to light. Right eye exhibits no discharge. Left eye exhibits no discharge.  Cardiovascular: Normal rate, regular rhythm and normal heart sounds.   Pulmonary/Chest: Effort normal and breath sounds normal.  Abdominal: Soft. There is no tenderness.  Neurological: She is alert. She is disoriented.  Awake, alert, confused. Does not answer  questions appropriately. Knows day of week, but cannot answer month, year or where she is. Appears to not know why she is here CN 3-12 grossly intact. 5/5 strength in all 4 extremities but does not follow commands very well.  Skin: Skin is warm and dry.  Nursing note and vitals reviewed.    ED Treatments / Results  Labs (all labs ordered are listed, but only abnormal results are displayed) Labs Reviewed  CBC WITH DIFFERENTIAL/PLATELET - Abnormal; Notable for the following:       Result Value   HCT 35.5 (*)    Neutro Abs 8.6 (*)    Lymphs Abs 0.6 (*)    All other components within normal limits  COMPREHENSIVE METABOLIC PANEL - Abnormal; Notable for the following:    Glucose, Bld 112 (*)    Total Protein 6.4 (*)    All other components within normal limits  CBG MONITORING, ED - Abnormal; Notable for the following:    Glucose-Capillary 111 (*)    All other components within normal limits  AMMONIA  URINALYSIS, ROUTINE W REFLEX MICROSCOPIC (NOT AT [facility])  TSH    EKG  EKG Interpretation  Date/Time:  Tuesday June 28 2016 08:34:31 EDT Ventricular Rate:  68 PR Interval:    QRS Duration: 81 QT Interval:  400 QTC Calculation: 426 R Axis:   80 Text Interpretation:  Sinus rhythm Nonspecific T abnormalities, lateral leads No old tracing to compare Confirmed by Tao Satz MD, Joye Wesenberg [phone]) on 06/28/2016 9:27:08 AM       EKG Interpretation  Date/Time:  Tuesday June 28 2016 08:34:31 EDT Ventricular Rate:  68 PR Interval:    QRS Duration: 81 QT Interval:  400 QTC Calculation: 426 R Axis:   80 Text Interpretation:  Sinus rhythm Nonspecific T abnormalities, lateral leads No old tracing to compare Confirmed by Helvi Royals MD, Vauda Salvucci [phone]) on 06/28/2016 9:27:08 AM       Radiology Ct Head Wo Contrast  Result Date: 06/28/2016 CLINICAL DATA:  Dizziness, fall, facial injury. EXAM: CT HEAD WITHOUT CONTRAST CT MAXILLOFACIAL WITHOUT CONTRAST TECHNIQUE: Multidetector CT imaging of the  head and maxillofacial structures were performed using the standard protocol without intravenous contrast. Multiplanar CT image reconstructions of the maxillofacial structures were also generated. COMPARISON:  None. FINDINGS: CT HEAD FINDINGS Brain: No evidence of acute infarction, hemorrhage, hydrocephalus, extra-axial collection or mass lesion/mass effect. Vascular: No hyperdense vessel or unexpected calcification. Skull: Normal. Negative for fracture or focal lesion. Sinuses/Orbits: No acute finding. Other: Global cortical atrophy.  No ventriculomegaly. Mild subcortical white matter and periventricular small vessel ischemic changes. CT MAXILLOFACIAL FINDINGS Osseous: No evidence of maxillofacial fracture. Orbits: Negative. No traumatic or inflammatory finding. Sinuses: Visualized paranasal sinuses and mastoid air cells are clear. Soft tissues: Negative. Limited intracranial: No significant or unexpected finding. IMPRESSION: No evidence of acute intracranial abnormality. Atrophy with mild small vessel ischemic changes. No evidence of maxillofacial fracture. Electronically Signed   By: Julian Hy M.D.   On: 06/28/2016 10:15   Ct Maxillofacial Wo Contrast  Result Date: 06/28/2016 CLINICAL DATA:  Dizziness, fall, facial injury. EXAM: CT HEAD WITHOUT CONTRAST CT MAXILLOFACIAL WITHOUT CONTRAST TECHNIQUE: Multidetector CT imaging of the head and maxillofacial structures were performed using the standard protocol without intravenous contrast. Multiplanar CT image reconstructions of the maxillofacial structures were also generated. COMPARISON:  None. FINDINGS: CT HEAD FINDINGS Brain: No evidence of acute infarction, hemorrhage, hydrocephalus, extra-axial collection or mass lesion/mass effect. Vascular: No hyperdense vessel or unexpected calcification. Skull: Normal. Negative for fracture or focal lesion. Sinuses/Orbits: No acute finding. Other: Global cortical atrophy.  No ventriculomegaly. Mild subcortical  white matter and periventricular small vessel ischemic changes. CT MAXILLOFACIAL FINDINGS Osseous: No evidence of maxillofacial fracture. Orbits: Negative. No traumatic or inflammatory finding. Sinuses: Visualized paranasal sinuses and mastoid air cells are clear. Soft tissues: Negative. Limited intracranial: No significant or unexpected finding. IMPRESSION: No evidence of acute intracranial abnormality. Atrophy with mild small vessel ischemic changes. No evidence of maxillofacial fracture. Electronically Signed   By: Julian Hy M.D.   On: 06/28/2016 10:15    Procedures Procedures (including critical care time)  Medications Ordered in ED Medications  sodium chloride 0.9 % bolus 1,000 mL (0 mLs Intravenous Stopped 06/28/16  1159)    And  0.9 %  sodium chloride infusion ( Intravenous Transfusing/Transfer 06/28/16 1334)  enoxaparin (LOVENOX) injection 40 mg (not administered)  sodium chloride flush (NS) 0.9 % injection 3 mL (not administered)  0.9 %  sodium chloride infusion (not administered)  sodium chloride flush (NS) 0.9 % injection 10-40 mL (not administered)     Initial Impression / Assessment and Plan / ED Course  I have reviewed the triage vital signs and the nursing notes.  Pertinent labs & imaging results that were available during my care of the patient were reviewed by me and considered in my medical decision making (see chart for details).  Clinical Course  Comment By Time  Patient is unable to get out her words correctly or tell me what happened. Will work up as AMS for now. No focal deficits to indicate obvious CVA Sherwood Gambler, MD 09/12 [phone]  Patient is awake, alert and acting like herself. Previous AMS was likely concussion related. Labwork fine at this time. Waiting on urine. Sherwood Gambler, MD 09/12 1119  Repeat ECG shows irregular rhythm. Not afib. In elderly patient with new irregular rhythm I feel she should be admitted on telemetry with further workup. Sherwood Gambler, MD 09/12 1149    Dr. Renne Crigler to admit for syncope.  Final Clinical Impressions(s) / ED Diagnoses   Final diagnoses:  Syncope and collapse  Nasal contusion, initial encounter    New Prescriptions Current Discharge Medication List       Sherwood Gambler, MD 06/28/16 1517

## 2016-06-28 NOTE — Progress Notes (Signed)
Injection not given, pt is currently in [facility]

## 2016-06-28 NOTE — ED Notes (Signed)
Transporting pt to 4W 1441.  IV infusing LT AC at 125/hr, no swelling or infiltration.

## 2016-06-28 NOTE — ED Notes (Signed)
PT drinking cup of water. Info PT urine sample would need to be collected

## 2016-06-28 NOTE — ED Triage Notes (Signed)
Per [facility] pt reports that she was feeling dizzy and was sitting on the toilet and fell forward, unsure of what she hit her face on husband came in and helped up from floor and while he was assisting her up, she had another episode.  EMS unsure which episode she hit her face.  Pt is receiving chemo, and has one more treatment.  Pt c/o dizziness, weakness, fatigue, and pain to the places that she bumped her face.  NSR 12 lead CBG 201 20G left ac  Vital signs per EMS 90/ 50  HR 67 100

## 2016-06-28 NOTE — H&P (Signed)
History and Physical    Jessica Johnston [id number] DOB: Aug 13, 1941 DOA: 06/28/2016  PCP: Jerlyn Ly, MD  Outpatient Specialists: Oncology, Dr. Marko Plume Patient coming from: home  Chief Complaint: Syncope  HPI: Jessica Johnston is a 75 y.o. 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 Patient tells me she woke up this morning, was moving around in the house and felt slightly lightheaded. She felt the need to go to the bathroom, went there and then had a syncopal episode and fell face first to the bathroom floor. Her husband to assisted he to the commode, had a BM, and then had another syncopal episode hitting face first the hard floor. This has never happened to her before. Patient denies any fevers or chills, she denies any chest pain or palpitations. She has no abdominal pain, she has had no nausea or vomiting or constipation. She has no diarrhea.   ED Course: In the emergency room, her vital signs are stable, she is afebrile, blood work is essentially unremarkable. She had a CT scan maxillofacial and CT scan of the head without any acute findings. TRH was asked for admission for syncopal episode   Review of Systems: As per HPI otherwise 10 point review of systems negative.   Past Medical History:  Diagnosis Date  . Arthritis    arthritis -hands-knees.  . Cancer ([place])    MELANOMA. multiple x5 different sites. last 10 yrs ago.  Marland Kitchen Headache    past hx.-no in awhile.  . High cholesterol   . Melanoma ([place])   . Skin cancer    melanoma    Past Surgical History:  Procedure Laterality Date  . ABDOMINAL HYSTERECTOMY N/A 02/18/2016   Procedure: HYSTERECTOMY ABDOMINAL;  Surgeon: Everitt Amber, MD;  Location: [facility]  [facility];  Service: Gynecology;  Laterality: N/A;  . DEBULKING N/A 02/18/2016   Procedure: RADICAL TUMOR DEBULKING;  Surgeon: Everitt Amber, MD;  Location: [facility];  Service: Gynecology;  Laterality: N/A;  . HYSTEROSCOPY    . LAPAROTOMY N/A 02/18/2016   Procedure: EXPLORATORY LAPAROTOMY;  Surgeon: Everitt Amber, MD;  Location: [facility];  Service: Gynecology;  Laterality: N/A;  . omental mass     Biopsy, CT guided  . OMENTECTOMY N/A 02/18/2016   Procedure: OMENTECTOMY;  Surgeon: Everitt Amber, MD;  Location: [facility];  Service: Gynecology;  Laterality: N/A;  . SALPINGOOPHORECTOMY Bilateral 02/18/2016   Procedure: BILATERAL SALPINGO OOPHORECTOMY;  Surgeon: Everitt Amber, MD;  Location: [facility];  Service: Gynecology;  Laterality: Bilateral;  . [place]     reports that she has never smoked. She has never used smokeless tobacco. She reports that she drinks alcohol. She reports that she does not use drugs.  No Known Allergies  Family History  Problem Relation Age of Onset  . Colitis Sister   . Parkinsonism Maternal Grandmother   . Heart disease Paternal Grandmother   . Heart disease Paternal Grandfather   . Cancer Cousin 12    paternal first cousin  . Skin cancer Son     Prior to Admission medications   Medication Sig Start Date End Date Taking? Authorizing Provider  dexamethasone (DECADRON) 4 MG tablet Take 5 tablets (20mg ) with food 12 hours and 6 hours before chemo. 05/19/16  Yes Lennis P  Marko Plume, MD  lidocaine-prilocaine (EMLA) cream Apply 1 application topically as needed. Place on port 1 hour before needle sticks, cover with plastic wrap. 03/11/16  Yes Lennis Marion Downer, MD  loratadine (CLARITIN) 10 MG tablet Take 10 mg by mouth as directed. For 5 days after chemo   Yes Historical Provider, MD  prochlorperazine (COMPAZINE) 10 MG tablet Take 1 tablet (10 mg total) by mouth every 6 (six) hours as needed for nausea or vomiting. Patient not taking: Reported on 05/19/2016 04/28/16   Gordy Levan, MD    Physical Exam: Vitals:   06/28/16 0951 06/28/16 0952 06/28/16 1115 06/28/16 1141  BP: 111/72 108/81 109/82 120/56  Pulse: 77 80  76  Resp:    19  Temp:      TempSrc:      SpO2:    100%  Weight:      Height:          Constitutional: NAD Vitals:   06/28/16 0951 06/28/16 0952 06/28/16 1115 06/28/16 1141  BP: 111/72 108/81 109/82 120/56  Pulse: 77 80  76  Resp:    19  Temp:      TempSrc:      SpO2:    100%  Weight:      Height:       Eyes: PERRL, lids and conjunctivae normal ENMT: Mucous membranes are moist. Posterior pharynx clear of any exudate or lesions. Nares with crusted blood Neck: normal, supple Respiratory: clear to auscultation bilaterally, no wheezing, no crackles. Normal respiratory effort. No accessory muscle use.  Cardiovascular: Regular rate and rhythm, no murmurs / rubs / gallops. No extremity edema. 2+ pedal pulses.  Abdomen: no tenderness, no masses palpated. Bowel sounds positive.  Musculoskeletal: no clubbing / cyanosis. Normal muscle tone.  Skin: no rashes, lesions, ulcers. No induration Neurologic: non focal,  strength 5 out of 5 in all 4 extremities, cranial nerves II-12 grossly intact  Psychiatric: Normal judgment and insight. Alert and oriented x 3. Normal mood.   Labs on Admission: I have personally reviewed following labs and imaging studies  CBC:  Recent Labs Lab 06/28/16 0915  WBC 9.4  NEUTROABS 8.6*  HGB 12.1  HCT 35.5*  MCV 89.2  PLT 123XX123   Basic Metabolic Panel:  Recent Labs Lab 06/28/16 0915  NA 137  K 4.6  CL 107  CO2 23  GLUCOSE 112*  BUN 13  CREATININE 0.59  CALCIUM 9.0   GFR: Estimated Creatinine Clearance: 53.3 mL/min (by C-G formula based on SCr of 0.8 mg/dL). Liver Function Tests:  Recent Labs Lab 06/28/16 0915  AST 23  ALT 24  ALKPHOS 72  BILITOT 0.9  PROT 6.4*  ALBUMIN 3.9   No results for input(s): LIPASE, AMYLASE in the last 168 hours.  Recent Labs Lab 06/28/16 0915  AMMONIA  18   CBG:  Recent Labs Lab 06/28/16 0839  GLUCAP 111*   Urine analysis:    Component Value Date/Time   COLORURINE YELLOW 06/28/2016 [place] 06/28/2016 1137   LABSPEC 1.013 06/28/2016 1137   PHURINE 8.0 06/28/2016 1137   GLUCOSEU NEGATIVE 06/28/2016 1137   HGBUR NEGATIVE 06/28/2016 1137   BILIRUBINUR NEGATIVE 06/28/2016 1137   KETONESUR NEGATIVE 06/28/2016 1137   PROTEINUR NEGATIVE 06/28/2016 1137   NITRITE NEGATIVE 06/28/2016 1137   LEUKOCYTESUR NEGATIVE 06/28/2016 1137   Radiological Exams on Admission: Ct Head Wo Contrast  Result Date: 06/28/2016 CLINICAL DATA:  Dizziness, fall, facial injury. EXAM: CT HEAD WITHOUT CONTRAST CT  MAXILLOFACIAL WITHOUT CONTRAST TECHNIQUE: Multidetector CT imaging of the head and maxillofacial structures were performed using the standard protocol without intravenous contrast. Multiplanar CT image reconstructions of the maxillofacial structures were also generated. COMPARISON:  None. FINDINGS: CT HEAD FINDINGS Brain: No evidence of acute infarction, hemorrhage, hydrocephalus, extra-axial collection or mass lesion/mass effect. Vascular: No hyperdense vessel or unexpected calcification. Skull: Normal. Negative for fracture or focal lesion. Sinuses/Orbits: No acute finding. Other: Global cortical atrophy.  No ventriculomegaly. Mild subcortical white matter and periventricular small vessel ischemic changes. CT MAXILLOFACIAL FINDINGS Osseous: No evidence of maxillofacial fracture. Orbits: Negative. No traumatic or inflammatory finding. Sinuses: Visualized paranasal sinuses and mastoid air cells are clear. Soft tissues: Negative. Limited intracranial: No significant or unexpected finding. IMPRESSION: No evidence of acute intracranial abnormality. Atrophy with mild small vessel ischemic changes. No evidence of maxillofacial fracture. Electronically Signed   By: Julian Hy M.D.   On: 06/28/2016 10:15   Ct Maxillofacial Wo Contrast  Result  Date: 06/28/2016 CLINICAL DATA:  Dizziness, fall, facial injury. EXAM: CT HEAD WITHOUT CONTRAST CT MAXILLOFACIAL WITHOUT CONTRAST TECHNIQUE: Multidetector CT imaging of the head and maxillofacial structures were performed using the standard protocol without intravenous contrast. Multiplanar CT image reconstructions of the maxillofacial structures were also generated. COMPARISON:  None. FINDINGS: CT HEAD FINDINGS Brain: No evidence of acute infarction, hemorrhage, hydrocephalus, extra-axial collection or mass lesion/mass effect. Vascular: No hyperdense vessel or unexpected calcification. Skull: Normal. Negative for fracture or focal lesion. Sinuses/Orbits: No acute finding. Other: Global cortical atrophy.  No ventriculomegaly. Mild subcortical white matter and periventricular small vessel ischemic changes. CT MAXILLOFACIAL FINDINGS Osseous: No evidence of maxillofacial fracture. Orbits: Negative. No traumatic or inflammatory finding. Sinuses: Visualized paranasal sinuses and mastoid air cells are clear. Soft tissues: Negative. Limited intracranial: No significant or unexpected finding. IMPRESSION: No evidence of acute intracranial abnormality. Atrophy with mild small vessel ischemic changes. No evidence of maxillofacial fracture. Electronically Signed   By: Julian Hy M.D.   On: 06/28/2016 10:15    EKG: Independently reviewed. 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   Assessment/Plan Active Problems:   Abdominal mass of other site   Secondary malignant neoplasm of peritoneum (HCC)   Fallopian tube cancer, carcinoma (HCC)   History of melanoma   Syncope   Syncopal episode  - Without any warning, patient without any prior history of same, admit to telemetry, obtain 2-D echo  - Obtain orthostatic vital signs  - This is most likely vasovagal given trip to the bathroom  - EKG reviewed with Dr. Meda Coffee from cardiology, no concerning features so far.   Fallopian  tube carcinoma - Outpatient management  History of melanoma   DVT prophylaxis: Lovenox  Code Status: FULL  Family Communication: d/w husband at bedside Disposition Plan: admit to telemetry Consults called: none  Admission status: observation    Marzetta Board, MD [facility] Pager [phone]  If 7PM-7AM, please contact night-coverage [email or web address] Password [facility]  06/28/2016, 12:26 PM

## 2016-06-28 NOTE — ED Triage Notes (Signed)
Per husband pt hit her face twice and had a nosebleed

## 2016-06-28 NOTE — ED Notes (Signed)
Patient transported to CT 

## 2016-06-28 NOTE — ED Notes (Signed)
RN to pull labs from IV

## 2016-06-29 ENCOUNTER — Observation Stay (HOSPITAL_BASED_OUTPATIENT_CLINIC_OR_DEPARTMENT_OTHER): Payer: Medicare Other

## 2016-06-29 DIAGNOSIS — R55 Syncope and collapse: Secondary | ICD-10-CM

## 2016-06-29 LAB — ECHOCARDIOGRAM COMPLETE
HEIGHTINCHES: 64 in
WEIGHTICAEL: 2016 [oz_av]

## 2016-06-29 MED ORDER — SODIUM CHLORIDE 0.9 % IV BOLUS (SEPSIS)
500.0000 mL | Freq: Once | INTRAVENOUS | Status: AC
  Administered 2016-06-29: 500 mL via INTRAVENOUS

## 2016-06-29 MED ORDER — HEPARIN SOD (PORK) LOCK FLUSH 100 UNIT/ML IV SOLN
500.0000 [IU] | INTRAVENOUS | Status: AC | PRN
  Administered 2016-06-29: 500 [IU]

## 2016-06-29 NOTE — Evaluation (Signed)
Physical Therapy Evaluation Patient Details Name: Jessica Johnston MRN: [id number] DOB: 1940/10/23 Today's Date: 06/29/2016   History of Present Illness  Jessica Johnston is a 75 y.o. 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 Patient tells me she woke up this morning, was moving around in the house and felt slightly lightheaded. She felt the need to go to the bathroom, went there and then had a syncopal episode and fell face first to the bathroom floor. Her husband to assisted he to the commode, had a BM, and then had another syncopal episode hitting face first the hard floor. This has never happened to her before  Clinical Impression  Pt prior to admission independent with all activity and active lifestyle with walking dog etc. Had 2 recent syncope episodes with fall, however at this time of assessment no dizziness with rolling turning or positional changes, not reports os weakness or dizziness with ambulation and walks with no LOB at swift pace. No further PT needs at this time, safe to DC from our mobility standpoint. Thanks     Follow Up Recommendations No PT follow up    Equipment Recommendations  None recommended by PT    Recommendations for Other Services       Precautions / Restrictions Precautions Precautions: Fall      Mobility  Bed Mobility Overal bed mobility: Independent                Transfers Overall transfer level: Independent                  Ambulation/Gait Ambulation/Gait assistance: Supervision Ambulation Distance (Feet): 400 Feet Assistive device: None Gait Pattern/deviations: WFL(Within Functional Limits)     General Gait Details: normal gait pattern, no LOB,  good swift pace and then with turning and slowing still no LOB or dizziness reported   Stairs            Wheelchair Mobility    Modified Rankin (Stroke Patients Only)       Balance Overall balance assessment: Independent;No apparent balance deficits (not formally assessed)                                           Pertinent Vitals/Pain Pain Assessment: No/denies pain    Home Living Family/patient expects to be discharged to:: Private residence Living Arrangements: Spouse/significant other Available Help at Discharge: Family Type of Home: House         Home Equipment: None      Prior Function Level of Independence: Independent               Hand Dominance        Extremity/Trunk Assessment               Lower Extremity Assessment: Overall WFL for tasks assessed         Communication   Communication: No difficulties  Cognition Arousal/Alertness: Awake/alert Behavior During Therapy: WFL for tasks assessed/performed Overall Cognitive Status: Within Functional Limits for tasks assessed                      General Comments  Exercises        Assessment/Plan    PT Assessment Patent does not need any further PT services  PT Diagnosis Other (comment) (recent fall )   PT Problem List    PT Treatment Interventions     PT Goals (Current goals can be found in the Care Plan section) Acute Rehab PT Goals PT Goal Formulation: All assessment and education complete, DC therapy    Frequency     Barriers to discharge        Co-evaluation               End of Session   Activity Tolerance: Patient tolerated treatment well Patient left: in bed Nurse Communication: Mobility status    Functional Assessment Tool Used: clinical assessment  Functional Limitation: Mobility: Walking and moving around Mobility: Walking and Moving Around Current Status [id number]): 0 percent impaired, limited or  restricted Mobility: Walking and Moving Around Goal Status [phone]): 0 percent impaired, limited or restricted Mobility: Walking and Moving Around Discharge Status [id number]): 0 percent impaired, limited or restricted    Time: 1256-1311 PT Time Calculation (min) (ACUTE ONLY): 15 min   Charges:   PT Evaluation $PT Eval Low Complexity: 1 Procedure     PT G Codes:   PT G-Codes **NOT FOR INPATIENT CLASS** Functional Assessment Tool Used: clinical assessment  Functional Limitation: Mobility: Walking and moving around Mobility: Walking and Moving Around Current Status [id number]): 0 percent impaired, limited or restricted Mobility: Walking and Moving Around Goal Status [id number]): 0 percent impaired, limited or restricted Mobility: Walking and Moving Around Discharge Status [id number]): 0 percent impaired, limited or restricted    Malene Blaydes 06/29/2016, 1:26 PM Clide Dales, PT Pager: [phone] 06/29/2016

## 2016-06-29 NOTE — Discharge Summary (Signed)
Physician Discharge Summary  Jessica Johnston [id number] DOB: 09-Apr-1941 DOA: 06/28/2016  PCP: Jerlyn Ly, MD  Admit date: 06/28/2016 Discharge date: 06/29/2016  Recommendations for Outpatient Follow-up:  1. No changes in medications on discharge   Discharge Diagnoses:  Active Problems:   Abdominal mass of other site   Secondary malignant neoplasm of peritoneum (HCC)   Fallopian tube cancer, carcinoma (HCC)   History of melanoma   Syncope    Discharge Condition: stable   Diet recommendation: as tolerated   History of present illness:   Per HPI "75 y.o. 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 Patient tells me she woke up this morning, was moving around in the house and felt slightly lightheaded. She felt the need to go to the bathroom, went there and then had a syncopal episode and fell face first to the bathroom floor. Her husband to assisted he to the commode, had a BM, and then had another syncopal episode hitting face first the hard floor. This has never happened to her before. Patient denies any fevers or chills, she denies any chest pain or palpitations. She has no abdominal pain, she has had no nausea or vomiting or constipation. She has no diarrhea.  ED Course: In the emergency room, her vital signs are stable, she is afebrile, blood work is essentially unremarkable. She had a CT scan maxillofacial and CT scan of the head without any acute findings. TRH was asked for admission for syncopal episode"  Hospital Course:   Active Problems:   Syncope and collapse - Likely secondary to recent chemotherapy, possible dehydration - She feels much better this morning - Per PT evaluation - no  follow up required  - 2 D ECHO results showed EF 60%  Signed:  Leisa Lenz, MD  [facility] 06/29/2016, 12:20 PM  Pager #: [phone]  Time spent in minutes: less than 30 minutes    Discharge Exam: Vitals:   06/29/16 0653 06/29/16 1132  BP: (!) 99/56   Pulse: 68   Resp:    Temp:  99 F (37.2 C)   Vitals:   06/29/16 0500 06/29/16 0555 06/29/16 0653 06/29/16 1132  BP: (!) 89/56 (!) 90/57 (!) 99/56   Pulse:  76 68   Resp: 18 20    Temp: 97.9 F (36.6 C)   99 F (37.2 C)  TempSrc: Oral   Oral  SpO2: 98%   100%  Weight:      Height:        General: Pt is alert, follows commands appropriately, not in acute distress Cardiovascular: Regular rate and rhythm, S1/S2 + Respiratory: Clear to auscultation bilaterally, no wheezing, no crackles, no rhonchi Abdominal: Soft, non tender, non distended, bowel sounds +, no guarding Extremities: no edema, no cyanosis, pulses palpable bilaterally DP and PT Neuro: Grossly nonfocal  Discharge Instructions  Discharge Instructions    Call MD for:  persistant nausea and vomiting    Complete by:  As directed    Call MD for:  redness, tenderness, or signs of infection (pain, swelling, redness, odor or green/yellow discharge around incision site)    Complete by:  As directed    Call MD for:  severe uncontrolled pain    Complete by:  As directed    Diet -  low sodium heart healthy    Complete by:  As directed    Increase activity slowly    Complete by:  As directed        Medication List    TAKE these medications   dexamethasone 4 MG tablet Commonly known as:  DECADRON Take 5 tablets (20mg ) with food 12 hours and 6 hours before chemo.   lidocaine-prilocaine cream Commonly known as:  EMLA Apply 1 application topically as needed. Place on port 1 hour before needle sticks, cover with plastic wrap.   loratadine 10 MG tablet Commonly known as:  CLARITIN Take 10 mg by mouth as directed. For 5 days after chemo    prochlorperazine 10 MG tablet Commonly known as:  COMPAZINE Take 1 tablet (10 mg total) by mouth every 6 (six) hours as needed for nausea or vomiting.      Follow-up Information    PERINI,MARK A, MD. Schedule an appointment as soon as possible for a visit in 2 week(s).   Specialty:  Internal Medicine Contact information: [place] [address] [phone]            The results of significant diagnostics from this hospitalization (including imaging, microbiology, ancillary and laboratory) are listed below for reference.    Significant Diagnostic Studies: Ct Head Wo Contrast  Result Date: 06/28/2016 CLINICAL DATA:  Dizziness, fall, facial injury. EXAM: CT HEAD WITHOUT CONTRAST CT MAXILLOFACIAL WITHOUT CONTRAST TECHNIQUE: Multidetector CT imaging of the head and maxillofacial structures were performed using the standard protocol without intravenous contrast. Multiplanar CT image reconstructions of the maxillofacial structures were also generated. COMPARISON:  None. FINDINGS: CT HEAD FINDINGS Brain: No evidence of acute infarction, hemorrhage, hydrocephalus, extra-axial collection or mass lesion/mass effect. Vascular: No hyperdense vessel or unexpected calcification. Skull: Normal. Negative for fracture or focal lesion. Sinuses/Orbits: No acute finding. Other: Global cortical atrophy.  No ventriculomegaly. Mild subcortical white matter and periventricular small vessel ischemic changes. CT MAXILLOFACIAL FINDINGS Osseous: No evidence of maxillofacial fracture. Orbits: Negative. No traumatic or inflammatory finding. Sinuses: Visualized paranasal sinuses and mastoid air cells are clear. Soft tissues: Negative. Limited intracranial: No significant or unexpected finding. IMPRESSION: No evidence of acute intracranial abnormality. Atrophy with mild small vessel ischemic changes. No evidence of maxillofacial fracture. Electronically Signed   By: Julian Hy M.D.   On: 06/28/2016  10:15   Ct Maxillofacial Wo Contrast  Result Date: 06/28/2016 CLINICAL DATA:  Dizziness, fall, facial injury. EXAM: CT HEAD WITHOUT CONTRAST CT MAXILLOFACIAL WITHOUT CONTRAST TECHNIQUE: Multidetector CT imaging of the head and maxillofacial structures were performed using the standard protocol without intravenous contrast. Multiplanar CT image reconstructions of the maxillofacial structures were also generated. COMPARISON:  None. FINDINGS: CT HEAD FINDINGS Brain: No evidence of acute infarction, hemorrhage, hydrocephalus, extra-axial collection or mass lesion/mass effect. Vascular: No hyperdense vessel or unexpected calcification. Skull: Normal. Negative for fracture or focal lesion. Sinuses/Orbits: No acute finding. Other: Global cortical atrophy.  No ventriculomegaly. Mild subcortical white matter and periventricular small vessel ischemic changes. CT MAXILLOFACIAL FINDINGS Osseous: No evidence of maxillofacial fracture. Orbits: Negative. No traumatic or inflammatory finding. Sinuses: Visualized paranasal sinuses and mastoid air cells are clear. Soft tissues: Negative. Limited intracranial: No significant or unexpected finding. IMPRESSION: No evidence of acute intracranial abnormality. Atrophy with mild small vessel ischemic changes. No evidence of maxillofacial fracture. Electronically Signed   By: Julian Hy M.D.   On: 06/28/2016 10:15    Microbiology: No results found for this or any previous visit (from the  past 240 hour(s)).   Labs: Basic Metabolic Panel:  Recent Labs Lab 06/28/16 0915  NA 137  K 4.6  CL 107  CO2 23  GLUCOSE 112*  BUN 13  CREATININE 0.59  CALCIUM 9.0   Liver Function Tests:  Recent Labs Lab 06/28/16 0915  AST 23  ALT 24  ALKPHOS 72  BILITOT 0.9  PROT 6.4*  ALBUMIN 3.9   No results for input(s): LIPASE, AMYLASE in the last 168 hours.  Recent Labs Lab 06/28/16 0915  AMMONIA 18   CBC:  Recent Labs Lab 06/28/16 0915  WBC 9.4  NEUTROABS 8.6*   HGB 12.1  HCT 35.5*  MCV 89.2  PLT 185   Cardiac Enzymes: No results for input(s): CKTOTAL, CKMB, CKMBINDEX, TROPONINI in the last 168 hours. BNP: BNP (last 3 results) No results for input(s): BNP in the last 8760 hours.  ProBNP (last 3 results) No results for input(s): PROBNP in the last 8760 hours.  CBG:  Recent Labs Lab 06/28/16 0839  GLUCAP 111*

## 2016-06-29 NOTE — Progress Notes (Signed)
  Echocardiogram 2D Echocardiogram has been performed.  Jessica Johnston 06/29/2016, 9:31 AM

## 2016-06-29 NOTE — Progress Notes (Signed)
Completed D/C teaching with patient. Answered questions. Patient will be D/C home with family in stable condition.

## 2016-06-29 NOTE — Discharge Instructions (Addendum)

## 2016-07-01 ENCOUNTER — Ambulatory Visit (HOSPITAL_BASED_OUTPATIENT_CLINIC_OR_DEPARTMENT_OTHER): Payer: Medicare Other

## 2016-07-01 ENCOUNTER — Telehealth: Payer: Self-pay

## 2016-07-01 VITALS — BP 109/56 | HR 76 | Temp 97.7°F | Resp 19

## 2016-07-01 DIAGNOSIS — Z95828 Presence of other vascular implants and grafts: Secondary | ICD-10-CM

## 2016-07-01 DIAGNOSIS — C5702 Malignant neoplasm of left fallopian tube: Secondary | ICD-10-CM | POA: Diagnosis present

## 2016-07-01 DIAGNOSIS — Z5189 Encounter for other specified aftercare: Secondary | ICD-10-CM | POA: Diagnosis not present

## 2016-07-01 MED ORDER — HEPARIN SOD (PORK) LOCK FLUSH 100 UNIT/ML IV SOLN
500.0000 [IU] | Freq: Once | INTRAVENOUS | Status: DC | PRN
  Filled 2016-07-01: qty 5

## 2016-07-01 MED ORDER — TBO-FILGRASTIM 300 MCG/0.5ML ~~LOC~~ SOSY
300.0000 ug | PREFILLED_SYRINGE | Freq: Once | SUBCUTANEOUS | Status: AC
  Administered 2016-07-01: 300 ug via SUBCUTANEOUS
  Filled 2016-07-01: qty 0.5

## 2016-07-01 NOTE — Telephone Encounter (Signed)
Left message for Ms Walcutt to call back to scheduling today or Dr. Mariana Kaufman nurse to schedule granix injection for this afternoon as she was in hospital on Tuesday 9-12 and missed granix injection.

## 2016-07-01 NOTE — Telephone Encounter (Signed)
Jessica Johnston called back to office and is scheduled for granix injection at 3 pm today at [facility].

## 2016-07-01 NOTE — Patient Instructions (Signed)

## 2016-07-03 ENCOUNTER — Other Ambulatory Visit: Payer: Self-pay | Admitting: Oncology

## 2016-07-04 ENCOUNTER — Ambulatory Visit (HOSPITAL_BASED_OUTPATIENT_CLINIC_OR_DEPARTMENT_OTHER): Payer: Medicare Other | Admitting: Oncology

## 2016-07-04 ENCOUNTER — Encounter: Payer: Self-pay | Admitting: Oncology

## 2016-07-04 ENCOUNTER — Other Ambulatory Visit (HOSPITAL_BASED_OUTPATIENT_CLINIC_OR_DEPARTMENT_OTHER): Payer: Medicare Other

## 2016-07-04 VITALS — BP 117/58 | HR 80 | Temp 97.7°F | Resp 18 | Wt 136.7 lb

## 2016-07-04 DIAGNOSIS — Z95828 Presence of other vascular implants and grafts: Secondary | ICD-10-CM

## 2016-07-04 DIAGNOSIS — C5702 Malignant neoplasm of left fallopian tube: Secondary | ICD-10-CM | POA: Diagnosis not present

## 2016-07-04 DIAGNOSIS — G620 Drug-induced polyneuropathy: Secondary | ICD-10-CM

## 2016-07-04 DIAGNOSIS — D701 Agranulocytosis secondary to cancer chemotherapy: Secondary | ICD-10-CM

## 2016-07-04 DIAGNOSIS — R55 Syncope and collapse: Secondary | ICD-10-CM

## 2016-07-04 DIAGNOSIS — T451X5A Adverse effect of antineoplastic and immunosuppressive drugs, initial encounter: Secondary | ICD-10-CM

## 2016-07-04 LAB — CBC WITH DIFFERENTIAL/PLATELET
BASO%: 0.2 % (ref 0.0–2.0)
Basophils Absolute: 0 10*3/uL (ref 0.0–0.1)
EOS ABS: 0 10*3/uL (ref 0.0–0.5)
EOS%: 0.8 % (ref 0.0–7.0)
HEMATOCRIT: 36 % (ref 34.8–46.6)
HGB: 11.8 g/dL (ref 11.6–15.9)
LYMPH#: 1.4 10*3/uL (ref 0.9–3.3)
LYMPH%: 23.2 % (ref 14.0–49.7)
MCH: 30.1 pg (ref 25.1–34.0)
MCHC: 32.7 g/dL (ref 31.5–36.0)
MCV: 92 fL (ref 79.5–101.0)
MONO#: 0.5 10*3/uL (ref 0.1–0.9)
MONO%: 8.6 % (ref 0.0–14.0)
NEUT%: 67.2 % (ref 38.4–76.8)
NEUTROS ABS: 4 10*3/uL (ref 1.5–6.5)
PLATELETS: 215 10*3/uL (ref 145–400)
RBC: 3.91 10*6/uL (ref 3.70–5.45)
RDW: 15.5 % — ABNORMAL HIGH (ref 11.2–14.5)
WBC: 6 10*3/uL (ref 3.9–10.3)

## 2016-07-04 LAB — COMPREHENSIVE METABOLIC PANEL
ALBUMIN: 3.5 g/dL (ref 3.5–5.0)
ALK PHOS: 100 U/L (ref 40–150)
ALT: 27 U/L (ref 0–55)
ANION GAP: 8 meq/L (ref 3–11)
AST: 17 U/L (ref 5–34)
BUN: 8.8 mg/dL (ref 7.0–26.0)
CALCIUM: 9.2 mg/dL (ref 8.4–10.4)
CO2: 26 mEq/L (ref 22–29)
CREATININE: 0.7 mg/dL (ref 0.6–1.1)
Chloride: 104 mEq/L (ref 98–109)
EGFR: 82 mL/min/{1.73_m2} — ABNORMAL LOW (ref >90)
Glucose: 104 mg/dl (ref 70–140)
Potassium: 4.1 mEq/L (ref 3.5–5.1)
Sodium: 138 mEq/L (ref 136–145)
TOTAL PROTEIN: 6.8 g/dL (ref 6.4–8.3)

## 2016-07-04 NOTE — Progress Notes (Signed)
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 She is due last planned cycle 6 on 07-15-16, with granix, then will have restaging CT AP prior to seeing Dr Denman George in Oct.   Patient had apparent vasovagal response to bowel movement on 06-28-16, falling off of toilet to hit head and face, then helped back to sitting position on toilet and fell again face forward. She was evaluated in ED and admitted 9-12 to 06-29-16, with CT head and face no significant findings, Echocardiogram ok, UA negative. She has not been in touch with Dr Joylene Draft since that episode. She has had no syncoopal or near syncopal episodes since DC home; note BP always tends to be low.  She denies HA or pain from resolving facial trauma now. 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 No nausea or vomiting. Appetite is good and she understands need for good hydration. Bowels ok. No problems with PAC. No abdominal or pelvic pain. No bleeding. No fever or symptoms of infection.  Remainder of 10 point Review of Systems negative.   PAC by IR 03-15-16 CA 125 preoperatively 656 Genetics testing negative 04-25-16, custom panel to [facility] (melanoma and fallopian ca)   Grandson's wedding is 08-13-16.  ONCOLOGIC HISTORY Patient had history of benign right ovarian cyst and uterine fibroids, for which she saw Dr Josephina Shih in 08-2014, CA 125 slightly elevated then which was felt due to fibroids. She developed mild RUQ discomfort 08-2015, which she noticed mostly when taking a deep breath. She had CT AP by Dr Joylene Draft 4-12-17which showed  carcinomatosis, with right adnexal lesion 2.8 x 2.7 x 3.5 cm without solid component, unremarkable left ovary, no ascites, no abdominal or pelvic adenopathy. CA 125 was 656 on 02-08-16. 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 CXR 02-16-16 small amount of right pleural thickening or fluid and linear atelectasis or scarring left lung base.She had exploratory laparotomy with TAH, BSO, omentectomy and radical debulking by Dr Denman George on 02-18-16, intraoperative findings remarkable for 10 cm omental cake, milial studding 1 mm pelvic surfaces, right colic gutter, right diaphragm and liver, mesentery of transverse colon and rectum, 3 cm cystic mass in right ovary. Procedure included argon beam coagulation of miliary tumor implants, with no residual disease apparent at completion. Surgical pathology [id number]) high grade serous carcinoma of left fallopian tube involving omentum, with benign serous right ovarian cyst 3.5 cm. Post operatively she has slow recovery of bowel function, but was stable for DC on 02-22-16. Case was presented at gyn oncology multidisciplinary conference on 03-07-16, with recommendation for 6 cycles of adjuvant carboplatin taxol. Cycle 1 carboplatin taxol was given 03-18-16, neutropenic with ANC 0.2 on day 14 cycle 1. Cycle 2 was delayed with elevated AP, AST, ALT of unclear etiology, given as carboplatin only on 04-15-16 also due to the LFT changes. With possibility that LFT elevations were related to zofran, that medication was discontinued and patient tolerated carboplatin + taxol with no difficulty and normal LFTs for cycle 3.  Objective:  Vital signs  in last 24 hours:  BP (!) 117/58 (BP Location: Left Arm, Patient Position: Sitting)   Pulse 80   Temp 97.7 F (36.5 C) (Oral)   Resp 18   Wt 136 lb 11.2 oz (62 kg)   SpO2 100%   BMI 23.46 kg/m   Weight stable. Alert, oriented and appropriate. Ambulatory without difficulty.  Alopecia  HEENT:PERRL, sclerae not icteric. Oral mucosa moist without lesions, posterior pharynx clear.  Neck supple. No JVD.  Lymphatics:no cervical,supraclavicular, inguinal adenopathy Resp: clear to auscultation bilaterally and normal percussion bilaterally Cardio: regular rate and rhythm. No gallop. GI: soft, nontender, not distended, no mass or organomegaly. Normally active bowel sounds. Surgical incision not remarkable. Musculoskeletal/ Extremities: without pitting edema, cords, tenderness Neuro: no peripheral neuropathy. Otherwise nonfocal Skin resolving ecchymoses central face. Otherwise without rash, petechiae Portacath-without erythema or tenderness  Lab Results:  Results for orders placed or performed in visit on 07/04/16  CBC with Differential  Result Value Ref Range   WBC 6.0 3.9 - 10.3 10e3/uL   NEUT# 4.0 1.5 - 6.5 10e3/uL   HGB 11.8 11.6 - 15.9 g/dL   HCT 36.0 34.8 - 46.6 %   Platelets 215 145 - 400 10e3/uL   MCV 92.0 79.5 - 101.0 fL   MCH 30.1 25.1 - 34.0 pg   MCHC 32.7 31.5 - 36.0 g/dL   RBC 3.91 3.70 - 5.45 10e6/uL   RDW 15.5 (H) 11.2 - 14.5 %   lymph# 1.4 0.9 - 3.3 10e3/uL   MONO# 0.5 0.1 - 0.9 10e3/uL   Eosinophils Absolute 0.0 0.0 - 0.5 10e3/uL   Basophils Absolute 0.0 0.0 - 0.1 10e3/uL   NEUT% 67.2 38.4 - 76.8 %   LYMPH% 23.2 14.0 - 49.7 %   MONO% 8.6 0.0 - 14.0 %   EOS% 0.8 0.0 - 7.0 %   BASO% 0.2 0.0 - 2.0 %  Comprehensive metabolic panel  Result Value Ref Range   Sodium 138 136 - 145 mEq/L   Potassium 4.1 3.5 - 5.1 mEq/L   Chloride 104 98 - 109 mEq/L   CO2 26 22 - 29 mEq/L   Glucose 104 70 - 140 mg/dl   BUN 8.8 7.0 - 26.0 mg/dL   Creatinine 0.7 0.6 - 1.1 mg/dL   Total Bilirubin <0.30 0.20 - 1.20 mg/dL   Alkaline Phosphatase 100 40 - 150 U/L   AST 17 5 - 34 U/L   ALT 27 0 - 55 U/L   Total Protein 6.8 6.4 - 8.3 g/dL   Albumin 3.5 3.5 - 5.0 g/dL   Calcium 9.2  8.4 - 10.4 mg/dL   Anion Gap 8 3 - 11 mEq/L   EGFR 82 (L) >90 ml/min/1.73 m2    CA 125 on 06-21-16 was 16.6, this having been 24 in July and 36 in June (and 656 preop)  Studies/Results: CT HEAD WITHOUT CONTRAST 06-28-16  CT MAXILLOFACIAL WITHOUT CONTRAST  TECHNIQUE: Multidetector CT imaging of the head and maxillofacial structures were performed using the standard protocol without intravenous contrast. Multiplanar CT image reconstructions of the maxillofacial structures were also generated.  COMPARISON:  None.  FINDINGS: CT HEAD FINDINGS  Brain: No evidence of acute infarction, hemorrhage, hydrocephalus, extra-axial collection or mass lesion/mass effect.  Vascular: No hyperdense vessel or unexpected calcification.  Skull: Normal. Negative for fracture or focal lesion.  Sinuses/Orbits: No acute finding.  Other: Global cortical atrophy.  No ventriculomegaly.  Mild subcortical white matter and periventricular small vessel ischemic changes.  CT MAXILLOFACIAL FINDINGS  Osseous:  No evidence of maxillofacial fracture.  Orbits: Negative. No traumatic or inflammatory finding.  Sinuses: Visualized paranasal sinuses and mastoid air cells are clear.  Soft tissues: Negative.  Limited intracranial: No significant or unexpected finding.  IMPRESSION: No evidence of acute intracranial abnormality. Atrophy with mild small vessel ischemic changes.  No evidence of maxillofacial fracture.   Medications: I have reviewed the patient's current medications. ZOFRAN NOT USED due to related elevation in LFTS  DISCUSSION Interval history reviewed in EMR and with patient related to apparent vasovagal reaction following bowel movement. Encourage her to maintain good hydration. She will follow blood pressures at home. I have asked her to follow up also with Dr Joylene Draft.  Patient would like to keep #6 chemo on schedule, ok to treat 9-29 as long as ANC >=1.5 and plt >=100k  that day, granix x3 after.  She will limit activity for the week or so after chemo. We have discussed follow up scans prior to return visit to Dr Denman George in Oct, that MD appointment made now for 08-08-16. I will see her in follow up of cycle 6, with labs. She understands that the [facility] can be used for these CTs and otherwise will need to be kept flushed every 6-8 weeks.     Assessment/Plan:  1. 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 Neutropenic day 14 cycle 1. Cycle 2 delayed from 04-08-16 with elevated LFTs, given as [place] only on 04-15-16. Cycle 3 carbo taxol tolerated without problems , LFTs normal off of zofran. Last planned cycle 6 on 07-15-16 as long as meets lab parameters above. I will see her 08-01-16 with labs and she will have CT AP prior to Dr Denman George on 08-08-16.  Genetics testing normal7-10-17, fallopian ca and melanomas. 2. Elevated AP, AST, ALT variable and preceded start of chemo,  secondary to zofran with surgery and first chemo. 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 Follows every 6 months with dermatologist Dr Linus Galas. Punch biopsy of scalp healing well, will need MOHs for basal cell there, after chemo completed.  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 PAC in , functioning well. See above 12.apparent vasovagal episode last week, no other findings on evaluation in hospital.  Plan as above   All questions answered and patient knows to call if concerns prior to next appointment. Chemo and granix orders confirmed. CT AP ordered to coordinate with gyn onc visit. Time spent 25 min including >50% counseling and coordination of care. Cc Dr Joylene Draft   Evlyn Clines, MD   07/04/2016, 7:29 PM

## 2016-07-05 DIAGNOSIS — T451X5A Adverse effect of antineoplastic and immunosuppressive drugs, initial encounter: Secondary | ICD-10-CM

## 2016-07-05 DIAGNOSIS — R55 Syncope and collapse: Secondary | ICD-10-CM | POA: Insufficient documentation

## 2016-07-05 DIAGNOSIS — G620 Drug-induced polyneuropathy: Secondary | ICD-10-CM | POA: Insufficient documentation

## 2016-07-08 ENCOUNTER — Ambulatory Visit: Payer: Medicare Other

## 2016-07-08 ENCOUNTER — Other Ambulatory Visit: Payer: Medicare Other

## 2016-07-13 ENCOUNTER — Telehealth: Payer: Self-pay | Admitting: Gynecologic Oncology

## 2016-07-13 NOTE — Telephone Encounter (Signed)
Returned call to patient and left message asking her to please call the office to have her questions addressed.

## 2016-07-15 ENCOUNTER — Ambulatory Visit: Payer: Medicare Other

## 2016-07-15 ENCOUNTER — Ambulatory Visit (HOSPITAL_BASED_OUTPATIENT_CLINIC_OR_DEPARTMENT_OTHER): Payer: Medicare Other

## 2016-07-15 ENCOUNTER — Other Ambulatory Visit (HOSPITAL_BASED_OUTPATIENT_CLINIC_OR_DEPARTMENT_OTHER): Payer: Medicare Other

## 2016-07-15 VITALS — BP 113/59 | HR 73 | Temp 97.6°F | Resp 18

## 2016-07-15 DIAGNOSIS — C5702 Malignant neoplasm of left fallopian tube: Secondary | ICD-10-CM | POA: Diagnosis not present

## 2016-07-15 DIAGNOSIS — Z5111 Encounter for antineoplastic chemotherapy: Secondary | ICD-10-CM | POA: Diagnosis not present

## 2016-07-15 DIAGNOSIS — Z95828 Presence of other vascular implants and grafts: Secondary | ICD-10-CM

## 2016-07-15 LAB — CBC WITH DIFFERENTIAL/PLATELET
BASO%: 0 % (ref 0.0–2.0)
Basophils Absolute: 0 10*3/uL (ref 0.0–0.1)
EOS%: 0 % (ref 0.0–7.0)
Eosinophils Absolute: 0 10*3/uL (ref 0.0–0.5)
HCT: 33.9 % — ABNORMAL LOW (ref 34.8–46.6)
HEMOGLOBIN: 11.5 g/dL — AB (ref 11.6–15.9)
LYMPH%: 10.3 % — AB (ref 14.0–49.7)
MCH: 30.7 pg (ref 25.1–34.0)
MCHC: 33.9 g/dL (ref 31.5–36.0)
MCV: 90.4 fL (ref 79.5–101.0)
MONO#: 0 10*3/uL — AB (ref 0.1–0.9)
MONO%: 0.6 % (ref 0.0–14.0)
NEUT%: 89.1 % — ABNORMAL HIGH (ref 38.4–76.8)
NEUTROS ABS: 2.8 10*3/uL (ref 1.5–6.5)
PLATELETS: 196 10*3/uL (ref 145–400)
RBC: 3.75 10*6/uL (ref 3.70–5.45)
RDW: 14.3 % (ref 11.2–14.5)
WBC: 3.2 10*3/uL — AB (ref 3.9–10.3)
lymph#: 0.3 10*3/uL — ABNORMAL LOW (ref 0.9–3.3)

## 2016-07-15 LAB — COMPREHENSIVE METABOLIC PANEL
ALBUMIN: 3.5 g/dL (ref 3.5–5.0)
ALT: 17 U/L (ref 0–55)
AST: 14 U/L (ref 5–34)
Alkaline Phosphatase: 89 U/L (ref 40–150)
Anion Gap: 12 mEq/L — ABNORMAL HIGH (ref 3–11)
BILIRUBIN TOTAL: 0.41 mg/dL (ref 0.20–1.20)
BUN: 11.3 mg/dL (ref 7.0–26.0)
CO2: 22 meq/L (ref 22–29)
CREATININE: 0.7 mg/dL (ref 0.6–1.1)
Calcium: 9.7 mg/dL (ref 8.4–10.4)
Chloride: 105 mEq/L (ref 98–109)
EGFR: 86 mL/min/{1.73_m2} — ABNORMAL LOW (ref >90)
GLUCOSE: 166 mg/dL — AB (ref 70–140)
Potassium: 4.2 mEq/L (ref 3.5–5.1)
SODIUM: 139 meq/L (ref 136–145)
TOTAL PROTEIN: 6.9 g/dL (ref 6.4–8.3)

## 2016-07-15 MED ORDER — SODIUM CHLORIDE 0.9 % IV SOLN
175.0000 mg/m2 | Freq: Once | INTRAVENOUS | Status: AC
  Administered 2016-07-15: 300 mg via INTRAVENOUS
  Filled 2016-07-15: qty 50

## 2016-07-15 MED ORDER — FAMOTIDINE IN NACL 20-0.9 MG/50ML-% IV SOLN
20.0000 mg | Freq: Once | INTRAVENOUS | Status: AC
  Administered 2016-07-15: 20 mg via INTRAVENOUS

## 2016-07-15 MED ORDER — SODIUM CHLORIDE 0.9 % IV SOLN
20.0000 mg | Freq: Once | INTRAVENOUS | Status: AC
  Administered 2016-07-15: 20 mg via INTRAVENOUS
  Filled 2016-07-15: qty 2

## 2016-07-15 MED ORDER — DIPHENHYDRAMINE HCL 50 MG/ML IJ SOLN
INTRAMUSCULAR | Status: AC
  Filled 2016-07-15: qty 1

## 2016-07-15 MED ORDER — LORAZEPAM 1 MG PO TABS
0.5000 mg | ORAL_TABLET | Freq: Once | ORAL | Status: AC | PRN
  Administered 2016-07-15: 0.5 mg via ORAL

## 2016-07-15 MED ORDER — SODIUM CHLORIDE 0.9 % IV SOLN
378.5000 mg | Freq: Once | INTRAVENOUS | Status: AC
  Administered 2016-07-15: 380 mg via INTRAVENOUS
  Filled 2016-07-15: qty 38

## 2016-07-15 MED ORDER — LORAZEPAM 1 MG PO TABS
ORAL_TABLET | ORAL | Status: AC
  Filled 2016-07-15: qty 1

## 2016-07-15 MED ORDER — SODIUM CHLORIDE 0.9 % IV SOLN
Freq: Once | INTRAVENOUS | Status: AC
  Administered 2016-07-15: 11:00:00 via INTRAVENOUS

## 2016-07-15 MED ORDER — SODIUM CHLORIDE 0.9% FLUSH
10.0000 mL | INTRAVENOUS | Status: DC | PRN
  Administered 2016-07-15: 10 mL
  Filled 2016-07-15: qty 10

## 2016-07-15 MED ORDER — SODIUM CHLORIDE 0.9 % IJ SOLN
10.0000 mL | INTRAMUSCULAR | Status: DC | PRN
  Administered 2016-07-15: 10 mL via INTRAVENOUS
  Filled 2016-07-15: qty 10

## 2016-07-15 MED ORDER — DIPHENHYDRAMINE HCL 50 MG/ML IJ SOLN
25.0000 mg | Freq: Once | INTRAMUSCULAR | Status: AC
  Administered 2016-07-15: 25 mg via INTRAVENOUS

## 2016-07-15 MED ORDER — HEPARIN SOD (PORK) LOCK FLUSH 100 UNIT/ML IV SOLN
500.0000 [IU] | Freq: Once | INTRAVENOUS | Status: AC | PRN
  Administered 2016-07-15: 500 [IU]
  Filled 2016-07-15: qty 5

## 2016-07-15 MED ORDER — PROCHLORPERAZINE EDISYLATE 5 MG/ML IJ SOLN
10.0000 mg | Freq: Once | INTRAMUSCULAR | Status: AC
  Administered 2016-07-15: 10 mg via INTRAVENOUS

## 2016-07-15 MED ORDER — PROCHLORPERAZINE EDISYLATE 5 MG/ML IJ SOLN
INTRAMUSCULAR | Status: AC
  Filled 2016-07-15: qty 2

## 2016-07-15 MED ORDER — FAMOTIDINE IN NACL 20-0.9 MG/50ML-% IV SOLN
INTRAVENOUS | Status: AC
  Filled 2016-07-15: qty 50

## 2016-07-15 NOTE — Patient Instructions (Signed)

## 2016-07-15 NOTE — Patient Instructions (Signed)
[facility] Discharge Instructions for Patients Receiving Chemotherapy  Today you received the following chemotherapy agents, carboplatin and Taxol  To help prevent nausea and vomiting after your treatment, we encourage you to take your nausea medication as directed.    If you develop nausea and vomiting that is not controlled by your nausea medication, call the clinic.   BELOW ARE SYMPTOMS THAT SHOULD BE REPORTED IMMEDIATELY:  *FEVER GREATER THAN 100.5 F  *CHILLS WITH OR WITHOUT FEVER  NAUSEA AND VOMITING THAT IS NOT CONTROLLED WITH YOUR NAUSEA MEDICATION  *UNUSUAL SHORTNESS OF BREATH  *UNUSUAL BRUISING OR BLEEDING  TENDERNESS IN MOUTH AND THROAT WITH OR WITHOUT PRESENCE OF ULCERS  *URINARY PROBLEMS  *BOWEL PROBLEMS  UNUSUAL RASH Items with * indicate a potential emergency and should be followed up as soon as possible.  Feel free to call the clinic you have any questions or concerns. The clinic phone number is [phone].  Please show the [facility] at check-in to the Emergency Department and triage nurse.

## 2016-07-16 ENCOUNTER — Ambulatory Visit (HOSPITAL_BASED_OUTPATIENT_CLINIC_OR_DEPARTMENT_OTHER): Payer: Medicare Other

## 2016-07-16 VITALS — BP 132/65 | HR 71 | Temp 98.1°F | Resp 17 | Ht 64.0 in

## 2016-07-16 DIAGNOSIS — Z5189 Encounter for other specified aftercare: Secondary | ICD-10-CM

## 2016-07-16 DIAGNOSIS — Z95828 Presence of other vascular implants and grafts: Secondary | ICD-10-CM

## 2016-07-16 DIAGNOSIS — C5702 Malignant neoplasm of left fallopian tube: Secondary | ICD-10-CM

## 2016-07-16 MED ORDER — TBO-FILGRASTIM 300 MCG/0.5ML ~~LOC~~ SOSY
300.0000 ug | PREFILLED_SYRINGE | Freq: Once | SUBCUTANEOUS | Status: AC
  Administered 2016-07-16: 300 ug via SUBCUTANEOUS

## 2016-07-18 ENCOUNTER — Telehealth: Payer: Self-pay | Admitting: Gynecologic Oncology

## 2016-07-18 ENCOUNTER — Ambulatory Visit (HOSPITAL_BASED_OUTPATIENT_CLINIC_OR_DEPARTMENT_OTHER): Payer: Medicare Other

## 2016-07-18 VITALS — BP 110/60 | HR 74 | Temp 98.3°F | Resp 18

## 2016-07-18 DIAGNOSIS — Z5189 Encounter for other specified aftercare: Secondary | ICD-10-CM

## 2016-07-18 DIAGNOSIS — Z95828 Presence of other vascular implants and grafts: Secondary | ICD-10-CM

## 2016-07-18 DIAGNOSIS — C5702 Malignant neoplasm of left fallopian tube: Secondary | ICD-10-CM | POA: Diagnosis present

## 2016-07-18 MED ORDER — TBO-FILGRASTIM 300 MCG/0.5ML ~~LOC~~ SOSY
300.0000 ug | PREFILLED_SYRINGE | Freq: Once | SUBCUTANEOUS | Status: AC
  Administered 2016-07-18: 300 ug via SUBCUTANEOUS
  Filled 2016-07-18: qty 0.5

## 2016-07-18 NOTE — Patient Instructions (Signed)

## 2016-07-18 NOTE — Telephone Encounter (Signed)
Returned call to patient.  Patient wanting a recommendation for a dietician at [facility].  Per Dr. Alycia Rossetti, she recommended [facility].  Called and left the information for the patient on a message and advised her to call for any questions or concerns or if a referral is needed.

## 2016-07-19 ENCOUNTER — Ambulatory Visit (HOSPITAL_BASED_OUTPATIENT_CLINIC_OR_DEPARTMENT_OTHER): Payer: Medicare Other

## 2016-07-19 VITALS — BP 115/56 | HR 78 | Temp 97.5°F | Resp 20

## 2016-07-19 DIAGNOSIS — C5702 Malignant neoplasm of left fallopian tube: Secondary | ICD-10-CM

## 2016-07-19 DIAGNOSIS — Z5189 Encounter for other specified aftercare: Secondary | ICD-10-CM

## 2016-07-19 DIAGNOSIS — Z95828 Presence of other vascular implants and grafts: Secondary | ICD-10-CM

## 2016-07-19 MED ORDER — TBO-FILGRASTIM 300 MCG/0.5ML ~~LOC~~ SOSY
300.0000 ug | PREFILLED_SYRINGE | Freq: Once | SUBCUTANEOUS | Status: AC
  Administered 2016-07-19: 300 ug via SUBCUTANEOUS
  Filled 2016-07-19: qty 0.5

## 2016-07-19 NOTE — Patient Instructions (Signed)

## 2016-07-20 ENCOUNTER — Telehealth: Payer: Self-pay | Admitting: Nutrition

## 2016-07-20 NOTE — Telephone Encounter (Signed)
Returned patient call for nutrition appointment. Called home phone and patient did not answer. Could not leave a message.

## 2016-07-24 ENCOUNTER — Encounter

## 2016-07-25 MED ORDER — VENLAFAXINE SR 37.5 MG 24 HR CAP
37.5 mg | ORAL_CAPSULE | Freq: Every day | ORAL | 2 refills | Status: DC
Start: 2016-07-25 — End: 2016-10-27

## 2016-07-25 MED ORDER — ANASTROZOLE 1 MG TAB
1 mg | ORAL_TABLET | Freq: Every day | ORAL | 3 refills | Status: DC
Start: 2016-07-25 — End: 2016-10-27

## 2016-07-25 MED ORDER — ATORVASTATIN 40 MG TAB
40 mg | ORAL_TABLET | Freq: Every day | ORAL | 3 refills | Status: DC
Start: 2016-07-25 — End: 2016-10-27

## 2016-07-25 MED ORDER — VALSARTAN-HYDROCHLOROTHIAZIDE 80 MG-12.5 MG TAB
ORAL_TABLET | Freq: Every day | ORAL | 3 refills | Status: DC
Start: 2016-07-25 — End: 2016-10-27

## 2016-07-25 NOTE — Telephone Encounter (Signed)
From: [name]  To: Joyce Gross, MD  Sent: 07/24/2016 7:41 PM EDT  Subject:  Medication Renewal Request    Original  authorizing provider: Joyce Gross, MD    Dawn Green would like a refill of the following medications:  anastrozole  (ARIMIDEX) 1 mg tablet Joyce Gross, MD]  venlafaxine-SR  [facility]) 37.5 mg capsule Joyce Gross, MD]    Preferred  pharmacy: [facility]    Comment:      Medication  renewals requested in this message routed to other providers:  valsartan-hydroCHLOROthiazide  (DIOVAN-HCT) 80-12.5 mg per tablet [Keefe H Lobb, DO]  atorvastatin  (LIPITOR) 40 mg tablet [Keefe H Lobb, DO]

## 2016-07-25 NOTE — Telephone Encounter (Signed)
From: [name]  To: Esau Grew, DO  Sent: 07/24/2016 7:41 PM EDT  Subject:  Medication Renewal Request    Original  authorizing provider: Esau Grew, DO    Dawn Green would like a refill of the following medications:  valsartan-hydroCHLOROthiazide  (DIOVAN-HCT) 80-12.5 mg per tablet [Adriona Kaney H Marica Trentham, DO]  atorvastatin  (LIPITOR) 40 mg tablet Esau Grew, DO]    Preferred  pharmacy: [facility]    Comment:      Medication  renewals requested in this message routed to other providers:  anastrozole  (ARIMIDEX) 1 mg tablet Joyce Gross, MD]  venlafaxine-SR  [facility]) 37.5 mg capsule Joyce Gross, MD]

## 2016-07-26 ENCOUNTER — Ambulatory Visit: Payer: Medicare Other | Admitting: Nutrition

## 2016-07-26 NOTE — Progress Notes (Signed)
75 year old female status post treatment for fallopian tube cancer.  Past medical history includes melanoma, hypercholesterolemia, and arthritis.  Medications include Decadron and Compazine.  Labs include glucose 166 and albumin 3.5 on September 29.  Height: 5 feet 4 inches. Weight: 136.7 pounds. Usual body weight: 140 pounds. BMI: 23.46.  Patient is interested in diet after cancer treatment.  Nutrition diagnosis:  Food and nutrition related knowledge deficit related to fallopian tube cancer as evidenced by no prior need for nutrition related information.  Intervention: Patient was educated on consuming a healthy plant-based diet with herbs and spices as tolerated. Reviewed food safety with patient. Encouraged adequate protein. Recommended patient monitor vitamin B12 since she is now following vegan/vegetarian diet. Provided fact sheets.  Questions were answered.  Teach back method used.  Contact information given.  Monitoring, evaluation, goals: Patient will tolerate healthy plant-based diet to minimize risk for cancer recurrence.  Nutrition diagnosis resolved.

## 2016-07-28 ENCOUNTER — Inpatient Hospital Stay: Admit: 2016-09-15 | Payer: MEDICARE | Primary: Family Medicine

## 2016-07-28 ENCOUNTER — Ambulatory Visit: Admit: 2016-07-28 | Discharge: 2016-07-28 | Payer: MEDICARE | Attending: Family Medicine | Primary: Family Medicine

## 2016-07-28 DIAGNOSIS — I10 Essential (primary) hypertension: Secondary | ICD-10-CM

## 2016-07-28 NOTE — Patient Instructions (Signed)
Cholesterol and Triglycerides Tests: About These Tests  What are they?  Cholesterol and triglycerides tests measure the amount of fats in your blood. These fats have both "good" (HDL) and "bad" (LDL) cholesterol.  Why are these tests done?  These tests are done to help find out your risk of a heart attack and stroke. They can help your doctor find out how well medicine is working for some health problems.  How can you prepare for these tests?  ?? Your doctor may tell you to fast before your tests. This means that you do not eat or drink anything except water for 9 to 12 hours before the tests. In most cases, you can take your medicines with water the morning of the test.  ?? Do not eat high-fat foods the night before the tests.  ?? Do not drink alcohol or do intense exercise the night before the tests.  ?? Be sure to tell your doctor about all the over-the-counter and prescription medicines and herbs or other supplements you take. They can affect the results of these tests.  What happens during these tests?  A health professional takes a sample of your blood.  What else should you know about these tests?  Your cholesterol levels can help your doctor find out your risk for having a heart attack or stroke.  But it's not just about your cholesterol. Your doctor uses your cholesterol levels plus other things to calculate your risk. These include:  ?? Your blood pressure.  ?? Whether or not you have diabetes.  ?? Your age, sex, and race.  ?? Whether or not you smoke.  You and your doctor can talk about whether you need to lower your risk and what treatment is best for you.  Where can you learn more?  Go to [employer].  Enter [phone] in the search box to learn more about "Cholesterol and Triglycerides Tests: About These Tests."  Current as of: January 18, 2016  Content Version: 11.3  ?? 2006-2017 [employer]wise[employer]. Care instructions adapted under  license by [employer] (which disclaims liability or warranty for this information). If you have questions about a medical condition or this instruction, always ask your healthcare professional. [place] any warranty or liability for your use of this information.

## 2016-07-28 NOTE — Progress Notes (Signed)
Pt here for fasting labs  Pt states she had reaction to pneumonia

## 2016-07-28 NOTE — Progress Notes (Signed)
Dawn Green is a 75 y.o. female   Chief Complaint   Patient presents with   ??? Labs    pt states she had a reaction to the PNA vaccine had a lot of swelling and erythema and warmth.  No drainage from it and it did self resolve.  Pt states she does not remember ever taking tricor.  Has doubled her krill oil dose.  Pt has been off metformin and diabetes has been extremely  Well controlled and has come down to 5.9 after a 3 month trial off metformin  she is a 75 y.o. year old female who presents for evalution.      Reviewed PmHx, RxHx, FmHx, SocHx, AllgHx and updated and dated in the chart.    Review of Systems - negative except as listed above in the HPI    Objective:     Vitals:    07/28/16 0902   BP: 138/74   Pulse: 74   Resp: 18   Temp: 98.2 ??F (36.8 ??C)   TempSrc: Oral   SpO2: 98%   Weight: 164 lb (74.4 kg)   Height: '5\' 3"'$  (1.6 m)       Current Outpatient Prescriptions   Medication Sig   ??? valsartan-hydroCHLOROthiazide (DIOVAN-HCT) 80-12.5 mg per tablet Take 1 Tab by mouth daily.   ??? atorvastatin (LIPITOR) 40 mg tablet Take 1 Tab by mouth Daily (before breakfast).   ??? anastrozole (ARIMIDEX) 1 mg tablet Take 1 Tab by mouth daily.   ??? venlafaxine-SR (EFFEXOR-XR) 37.5 mg capsule Take 1 Cap by mouth daily. Dose may be taken before bedtime.   ??? ergocalciferol (VITAMIN D2) 50,000 unit capsule TAKE 1 CAPSULE EVERY 7 DAYS   ??? Cetirizine (ZYRTEC) 10 mg cap Take  by mouth daily.   ??? alendronate (FOSAMAX) 35 mg tablet Take 1 Tab by mouth every seven (7) days.   ??? krill-om-3-dha-epa-phospho-ast (MEGARED OMEGA-3 KRILL OIL) 1,000-230-60 mg cap Take 1 Tab by mouth two (2) times a day.   ??? brimonidine-timolol (COMBIGAN) 0.2-0.5 % drop ophthalmic solution Administer 1 Drop to both eyes every twelve (12) hours.   ??? aspirin 81 mg chewable tablet Take 81 mg by mouth daily.     No current facility-administered medications for this visit.        Physical Examination: General appearance - alert, well appearing, and in no distress   Mental status - alert, oriented to person, place, and time  Eyes - pupils equal and reactive, extraocular eye movements intact  Chest - clear to auscultation, no wheezes, rales or rhonchi, symmetric air entry  Heart - normal rate, regular rhythm, normal S1, S2, no murmurs, rubs, clicks or gallops  Abdomen - soft, nontender, nondistended, no masses or organomegaly  Extremities - peripheral pulses normal, no pedal edema, no clubbing or cyanosis  Skin - normal coloration and turgor, no rashes, no suspicious skin lesions noted      Assessment/ Plan:   Diagnoses and all orders for this visit:    1. Essential hypertension  -     METABOLIC PANEL, COMPREHENSIVE    2. Mixed hyperlipidemia  -     METABOLIC PANEL, COMPREHENSIVE  -     LIPID PANEL    3. Hypertriglyceridemia  -     HEMOGLOBIN A1C WITH EAG    4. Elevated fasting glucose  -     HEMOGLOBIN A1C WITH EAG       Follow-up Disposition:  Return in about 6 months (around 01/26/2017), or if  symptoms worsen or fail to improve.    I have discussed the diagnosis with the patient and the intended plan as seen in the above orders.  The patient has received an after-visit summary and questions were answered concerning future plans. Pt conveyed understanding of plan.    Medication Side Effects and Warnings were discussed with patient      Esau Grew, DO

## 2016-07-29 LAB — LIPID PANEL
Cholesterol, total: 177 mg/dL (ref 100–199)
HDL Cholesterol: 37 mg/dL — ABNORMAL LOW (ref >39)
Triglyceride: 498 mg/dL — ABNORMAL HIGH (ref 0–149)

## 2016-07-29 LAB — METABOLIC PANEL, COMPREHENSIVE
A-G Ratio: 1.8 (ref 1.2–2.2)
ALT (SGPT): 19 IU/L (ref 0–32)
AST (SGOT): 17 IU/L (ref 0–40)
Albumin: 4.2 g/dL (ref 3.5–4.8)
Alk. phosphatase: 53 IU/L (ref 39–117)
BUN/Creatinine ratio: 28 (ref 12–28)
BUN: 17 mg/dL (ref 8–27)
Bilirubin, total: 0.4 mg/dL (ref 0.0–1.2)
CO2: 25 mmol/L (ref 18–29)
Calcium: 8.9 mg/dL (ref 8.7–10.3)
Chloride: 101 mmol/L (ref 96–106)
Creatinine: 0.6 mg/dL (ref 0.57–1.00)
GFR est AA: 104 mL/min/{1.73_m2} (ref >59)
GFR est non-AA: 90 mL/min/{1.73_m2} (ref >59)
GLOBULIN, TOTAL: 2.4 g/dL (ref 1.5–4.5)
Glucose: 135 mg/dL — ABNORMAL HIGH (ref 65–99)
Potassium: 4 mmol/L (ref 3.5–5.2)
Protein, total: 6.6 g/dL (ref 6.0–8.5)
Sodium: 142 mmol/L (ref 134–144)

## 2016-07-29 LAB — HEMOGLOBIN A1C WITH EAG
Estimated average glucose: 131 mg/dL
Hemoglobin A1c: 6.2 % — ABNORMAL HIGH (ref 4.8–5.6)

## 2016-07-29 LAB — CVD REPORT

## 2016-07-29 NOTE — Progress Notes (Signed)
Your prediabetes has gone back up some so please continue to work on your diet and staying active.  Your triglycerides have not budged and actually went up slightly.  I would like for you to try tricor that we discussed and then recheck in 3 months.  Let me know if you are agreeable to this by sending me a message back and if so I will send it to your pharmacy.

## 2016-07-31 ENCOUNTER — Other Ambulatory Visit: Payer: Self-pay | Admitting: Oncology

## 2016-07-31 DIAGNOSIS — C5702 Malignant neoplasm of left fallopian tube: Secondary | ICD-10-CM

## 2016-08-01 ENCOUNTER — Other Ambulatory Visit (HOSPITAL_BASED_OUTPATIENT_CLINIC_OR_DEPARTMENT_OTHER): Payer: Medicare Other

## 2016-08-01 ENCOUNTER — Ambulatory Visit (HOSPITAL_BASED_OUTPATIENT_CLINIC_OR_DEPARTMENT_OTHER): Payer: Medicare Other | Admitting: Oncology

## 2016-08-01 VITALS — BP 105/58 | HR 65 | Temp 97.7°F | Resp 18 | Ht 64.0 in | Wt 135.7 lb

## 2016-08-01 DIAGNOSIS — Z95828 Presence of other vascular implants and grafts: Secondary | ICD-10-CM

## 2016-08-01 DIAGNOSIS — D701 Agranulocytosis secondary to cancer chemotherapy: Secondary | ICD-10-CM

## 2016-08-01 DIAGNOSIS — G620 Drug-induced polyneuropathy: Secondary | ICD-10-CM

## 2016-08-01 DIAGNOSIS — C5702 Malignant neoplasm of left fallopian tube: Secondary | ICD-10-CM | POA: Diagnosis not present

## 2016-08-01 DIAGNOSIS — Z85820 Personal history of malignant melanoma of skin: Secondary | ICD-10-CM

## 2016-08-01 DIAGNOSIS — T451X5A Adverse effect of antineoplastic and immunosuppressive drugs, initial encounter: Secondary | ICD-10-CM

## 2016-08-01 DIAGNOSIS — Z789 Other specified health status: Secondary | ICD-10-CM

## 2016-08-01 LAB — CBC WITH DIFFERENTIAL/PLATELET
BASO%: 0.4 % (ref 0.0–2.0)
Basophils Absolute: 0 10*3/uL (ref 0.0–0.1)
EOS%: 1.6 % (ref 0.0–7.0)
Eosinophils Absolute: 0 10*3/uL (ref 0.0–0.5)
HCT: 33.6 % — ABNORMAL LOW (ref 34.8–46.6)
HGB: 11.3 g/dL — ABNORMAL LOW (ref 11.6–15.9)
LYMPH%: 37.4 % (ref 14.0–49.7)
MCH: 31.1 pg (ref 25.1–34.0)
MCHC: 33.8 g/dL (ref 31.5–36.0)
MCV: 92.2 fL (ref 79.5–101.0)
MONO#: 0.4 10*3/uL (ref 0.1–0.9)
MONO%: 16.4 % — ABNORMAL HIGH (ref 0.0–14.0)
NEUT%: 44.2 % (ref 38.4–76.8)
NEUTROS ABS: 1.1 10*3/uL — AB (ref 1.5–6.5)
PLATELETS: 249 10*3/uL (ref 145–400)
RBC: 3.64 10*6/uL — AB (ref 3.70–5.45)
RDW: 14.7 % — ABNORMAL HIGH (ref 11.2–14.5)
WBC: 2.5 10*3/uL — AB (ref 3.9–10.3)
lymph#: 0.9 10*3/uL (ref 0.9–3.3)

## 2016-08-01 LAB — COMPREHENSIVE METABOLIC PANEL
ALT: 15 U/L (ref 0–55)
ANION GAP: 8 meq/L (ref 3–11)
AST: 14 U/L (ref 5–34)
Albumin: 3.3 g/dL — ABNORMAL LOW (ref 3.5–5.0)
Alkaline Phosphatase: 75 U/L (ref 40–150)
BILIRUBIN TOTAL: 0.4 mg/dL (ref 0.20–1.20)
BUN: 7.6 mg/dL (ref 7.0–26.0)
CHLORIDE: 107 meq/L (ref 98–109)
CO2: 25 meq/L (ref 22–29)
CREATININE: 0.7 mg/dL (ref 0.6–1.1)
Calcium: 9.1 mg/dL (ref 8.4–10.4)
EGFR: 86 mL/min/{1.73_m2} — ABNORMAL LOW (ref >90)
GLUCOSE: 87 mg/dL (ref 70–140)
Potassium: 4 mEq/L (ref 3.5–5.1)
SODIUM: 141 meq/L (ref 136–145)
TOTAL PROTEIN: 6.3 g/dL — AB (ref 6.4–8.3)

## 2016-08-01 NOTE — Progress Notes (Signed)
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 She will have CT AP on 08-05-16, then see Dr Denman George on 08-08-16.   She has yearly exam with Dr Joylene Draft next week  Patient has tolerated chemotherapy really very well, counts supported with granix x3 after each treatment. She has some minimal neuropathy fingertips and toes since cycle 6, not interfering with function and better with activity. She was more fatigued for a little longer after last treatment, but feels well now. Appetite is good, no SOB, no fever or symptoms of infection, bowels moving well, no pain, no LE swelling, no problems with PAC. No new or different skin lesions. 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 No further vasovagal or syncopal episodes Remainder of 10 point Review of Systems negative.   Flu vaccine:  will have by Dr Joylene Draft next week due to [place] only 1.1 today PAC by IR 03-15-16. Flushed 07-15-16; should be used for CT 08-05-16  CA 125 preoperatively 656 Genetics testing negative 04-25-16, custom panel to [facility] (melanoma and fallopian ca)   Grandson's wedding is 08-13-16  ONCOLOGIC HISTORY Patient had history of benign right ovarian cyst and uterine fibroids, for which she saw Dr Josephina Shih in 08-2014, CA 125 slightly elevated then which was felt due to fibroids. She developed mild RUQ discomfort 08-2015, which she noticed mostly when taking a deep breath. She had CT AP by Dr Joylene Draft 4-12-17which showed carcinomatosis, with right adnexal lesion 2.8 x 2.7 x 3.5 cm without solid component, unremarkable left ovary, no ascites, no  abdominal or pelvic adenopathy. CA 125 was 656 on 02-08-16. 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 CXR 02-16-16 small amount of right pleural thickening or fluid and linear atelectasis or scarring left lung base.She had exploratory laparotomy with TAH, BSO, omentectomy and radical debulking by Dr Denman George on 02-18-16, intraoperative findings remarkable for 10 cm omental cake, milial studding 1 mm pelvic surfaces, right colic gutter, right diaphragm and liver, mesentery of transverse colon and rectum, 3 cm cystic mass in right ovary. Procedure included argon beam coagulation of miliary tumor implants, with no residual disease apparent at completion. Surgical pathology [id number]) high grade serous carcinoma of left fallopian tube involving omentum, with benign serous right ovarian cyst 3.5 cm. Post operatively she has slow recovery of bowel function, but was stable for DC on 02-22-16. Case was presented at gyn oncology multidisciplinary conference on 03-07-16, with recommendation for 6 cycles of adjuvant carboplatin taxol. Cycle 1 carboplatin taxol was given 03-18-16, neutropenic with ANC 0.2 on day 14 cycle 1. Cycle 2 was delayed with elevated AP, AST, ALT of unclear etiology, given as carboplatin only on 04-15-16 also due to the LFT changes. With possibility that LFT elevations were related to zofran, that medication was discontinued and patient tolerated carboplatin + taxol with no difficulty and normal LFTs for cycle 3.   Objective:  Vital signs in last 24 hours:  BP (!) 105/58 (BP Location: Right Arm, Patient Position: Sitting)   Pulse 65  Temp 97.7 F (36.5 C) (Oral)   Resp 18   Ht 5' 4" (1.626 m)   Wt 135 lb 11.2 oz (61.6 kg)   SpO2 98%   BMI 23.29 kg/m  Weight down 1 lb, intentional for upcoming wedding. Alert, oriented and appropriate. Ambulatory without  difficulty.  Alopecia  HEENT:PERRL, sclerae not icteric. Oral mucosa moist without lesions, posterior pharynx clear.   No JVD.  Lymphatics:no cervical,supraclavicular, axillary or inguinal adenopathy Resp: clear to auscultation bilaterally and normal percussion bilaterally Cardio: regular rate and rhythm. No gallop. GI: soft, nontender, not distended, no mass or organomegaly. Normally active bowel sounds. Surgical incision not remarkable. Musculoskeletal/ Extremities: without pitting edema, cords, tenderness Neuro: no peripheral neuropathy. Otherwise nonfocal Skin without rash, ecchymosis, petechiae Breasts: without dominant mass, skin or nipple findings. Axillae benign. Portacath-without erythema or tenderness  Lab Results:  Results for orders placed or performed in visit on 08/01/16  CBC with Differential  Result Value Ref Range   WBC 2.5 (L) 3.9 - 10.3 10e3/uL   NEUT# 1.1 (L) 1.5 - 6.5 10e3/uL   HGB 11.3 (L) 11.6 - 15.9 g/dL   HCT 33.6 (L) 34.8 - 46.6 %   Platelets 249 145 - 400 10e3/uL   MCV 92.2 79.5 - 101.0 fL   MCH 31.1 25.1 - 34.0 pg   MCHC 33.8 31.5 - 36.0 g/dL   RBC 3.64 (L) 3.70 - 5.45 10e6/uL   RDW 14.7 (H) 11.2 - 14.5 %   lymph# 0.9 0.9 - 3.3 10e3/uL   MONO# 0.4 0.1 - 0.9 10e3/uL   Eosinophils Absolute 0.0 0.0 - 0.5 10e3/uL   Basophils Absolute 0.0 0.0 - 0.1 10e3/uL   NEUT% 44.2 38.4 - 76.8 %   LYMPH% 37.4 14.0 - 49.7 %   MONO% 16.4 (H) 0.0 - 14.0 %   EOS% 1.6 0.0 - 7.0 %   BASO% 0.4 0.0 - 2.0 %  Comprehensive metabolic panel  Result Value Ref Range   Sodium 141 136 - 145 mEq/L   Potassium 4.0 3.5 - 5.1 mEq/L   Chloride 107 98 - 109 mEq/L   CO2 25 22 - 29 mEq/L   Glucose 87 70 - 140 mg/dl   BUN 7.6 7.0 - 26.0 mg/dL   Creatinine 0.7 0.6 - 1.1 mg/dL   Total Bilirubin 0.40 0.20 - 1.20 mg/dL   Alkaline Phosphatase 75 40 - 150 U/L   AST 14 5 - 34 U/L   ALT 15 0 - 55 U/L   Total Protein 6.3 (L) 6.4 - 8.3 g/dL   Albumin 3.3 (L) 3.5 - 5.0 g/dL   Calcium 9.1  8.4 - 10.4 mg/dL   Anion Gap 8 3 - 11 mEq/L   EGFR 86 (L) >90 ml/min/1.73 m2     Studies/Results:  No results found.  Medications: I have reviewed the patient's current medications.  DISCUSSION Patient and husband are pleased that she tolerated adjuvant chemo so well and that she can already tell improvement in energy etc. They understand that it may be several more months before she feels really back to 100%.  She is aware of restaging CT on 10-20 and follow up appointment with Dr Denman George on 10-23.  Depending on CT information and gyn oncology schedule, she may be able to go to prn visits with medical oncology. She is also aware that this office is actively recruiting for another medical oncologist to work with gyn oncology.   Will recheck CBC with Dr Serita Grit appointment on 10-23, if [facility]  should be fine for [facility] on 11-7. If PAC used for CT on 08-05-16, this will need flush again ~ 09-30-16 and will repeat labs then.  I will ask RN to follow up with patient re labs 10-23 and ongoing PAC flushes every 6-8 weeks if not used otherwise. She is in agreement with keeping PAC for now, in case needed in next several months.   Assessment/Plan:  1. 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 Neutropenic day 14 cycle 1. Cycle 2 delayed from 04-08-16 with elevated LFTs, given as carbo only on 04-15-16; LFTs normalized off of zofran.  Cycle 6 on 07-15-16. She will have CT AP prior to Dr Denman George on 08-08-16.  Genetics testing normal7-10-17, fallopian ca and melanomas. 2. Intolerance to zofran with elevated AP, AST, ALT variable and preceded start of chemo,  secondary to zofran with surgery and first chemo. 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 Follows every 6 months with dermatologist Dr Linus Galas.  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 PAC in , functioning well.  Should be used and flushed with CT on 10-20, then every 6-8 weeks when not otherwise used.  12.apparent vasovagal episode early Sept, resolved. 13. Needs flu vaccine this fall: will have with Dr Joylene Draft next week as [place] only 1.1 today  All questions answered and patient is in agreement with recommendations and plans. Will follow up restaging information upcoming. CC Drs Joylene Draft, Leschin, Hollar, Cathlyn Parsons, MD   08/01/2016, 9:34 AM

## 2016-08-02 LAB — CA 125: Cancer Antigen (CA) 125: 17.2 U/mL (ref 0.0–38.1)

## 2016-08-05 ENCOUNTER — Ambulatory Visit (HOSPITAL_COMMUNITY)
Admission: RE | Admit: 2016-08-05 | Discharge: 2016-08-05 | Disposition: A | Discharge status: Home / Self Care | Payer: Medicare Other | Source: Ambulatory Visit | Attending: Oncology | Admitting: Oncology

## 2016-08-05 DIAGNOSIS — C5702 Malignant neoplasm of left fallopian tube: Secondary | ICD-10-CM | POA: Diagnosis not present

## 2016-08-05 MED ORDER — IOPAMIDOL (ISOVUE-300) INJECTION 61%
100.0000 mL | Freq: Once | INTRAVENOUS | Status: AC | PRN
  Administered 2016-08-05: 100 mL via INTRAVENOUS

## 2016-08-07 ENCOUNTER — Encounter: Payer: Self-pay | Admitting: Oncology

## 2016-08-07 NOTE — Progress Notes (Signed)
[place] END OF TREATMENT   Name: Jessica Johnston Date: August 07, 2016  MRN: [id number] DOB: 01/17/1941   TREATMENT DATES: 03-18-2016 thru 07-15-2016   REFERRING PHYSICIAN: Everitt Amber  DIAGNOSIS: high grade serous carcinoma of left fallopian tube  STAGE AT START OF TREATMENT: IIIC   INTENT  curative   DRUGS OR REGIMENS GIVEN: carboplatin taxol  MAJOR TOXICITIES: neutropenia, minimal peripheral neuropathy. (elevated LFTs related to zofran)   REASON TREATMENT STOPPED: completion of planned course  PERFORMANCE STATUS AT END: ECOG 1   ONGOING PROBLEMS: chemo leukopenia, minimal peripheral neuropathy    FOLLOW UP PLANS: restaging CTs, follow up with gyn oncology

## 2016-08-08 ENCOUNTER — Encounter: Payer: Self-pay | Admitting: Gynecologic Oncology

## 2016-08-08 ENCOUNTER — Ambulatory Visit: Payer: Medicare Other | Attending: Gynecologic Oncology | Admitting: Gynecologic Oncology

## 2016-08-08 ENCOUNTER — Other Ambulatory Visit (HOSPITAL_BASED_OUTPATIENT_CLINIC_OR_DEPARTMENT_OTHER): Payer: Medicare Other

## 2016-08-08 VITALS — BP 107/55 | HR 66 | Temp 97.6°F | Resp 18 | Ht 64.0 in | Wt 137.2 lb

## 2016-08-08 DIAGNOSIS — Z8249 Family history of ischemic heart disease and other diseases of the circulatory system: Secondary | ICD-10-CM | POA: Insufficient documentation

## 2016-08-08 DIAGNOSIS — Z9889 Other specified postprocedural states: Secondary | ICD-10-CM | POA: Diagnosis not present

## 2016-08-08 DIAGNOSIS — N83202 Unspecified ovarian cyst, left side: Secondary | ICD-10-CM | POA: Insufficient documentation

## 2016-08-08 DIAGNOSIS — C786 Secondary malignant neoplasm of retroperitoneum and peritoneum: Secondary | ICD-10-CM | POA: Insufficient documentation

## 2016-08-08 DIAGNOSIS — Z808 Family history of malignant neoplasm of other organs or systems: Secondary | ICD-10-CM | POA: Diagnosis not present

## 2016-08-08 DIAGNOSIS — C5702 Malignant neoplasm of left fallopian tube: Secondary | ICD-10-CM | POA: Diagnosis not present

## 2016-08-08 DIAGNOSIS — Z79899 Other long term (current) drug therapy: Secondary | ICD-10-CM | POA: Diagnosis not present

## 2016-08-08 DIAGNOSIS — Z85820 Personal history of malignant melanoma of skin: Secondary | ICD-10-CM | POA: Diagnosis not present

## 2016-08-08 DIAGNOSIS — Z809 Family history of malignant neoplasm, unspecified: Secondary | ICD-10-CM | POA: Diagnosis not present

## 2016-08-08 DIAGNOSIS — Z9221 Personal history of antineoplastic chemotherapy: Secondary | ICD-10-CM | POA: Insufficient documentation

## 2016-08-08 LAB — CBC WITH DIFFERENTIAL/PLATELET
BASO%: 0.8 % (ref 0.0–2.0)
BASOS ABS: 0 10*3/uL (ref 0.0–0.1)
EOS ABS: 0 10*3/uL (ref 0.0–0.5)
EOS%: 0.8 % (ref 0.0–7.0)
HEMATOCRIT: 33.5 % — AB (ref 34.8–46.6)
HGB: 11.2 g/dL — ABNORMAL LOW (ref 11.6–15.9)
LYMPH#: 1.2 10*3/uL (ref 0.9–3.3)
LYMPH%: 31.7 % (ref 14.0–49.7)
MCH: 31.1 pg (ref 25.1–34.0)
MCHC: 33.5 g/dL (ref 31.5–36.0)
MCV: 93 fL (ref 79.5–101.0)
MONO#: 0.5 10*3/uL (ref 0.1–0.9)
MONO%: 12.8 % (ref 0.0–14.0)
NEUT#: 2 10*3/uL (ref 1.5–6.5)
NEUT%: 53.9 % (ref 38.4–76.8)
PLATELETS: 233 10*3/uL (ref 145–400)
RBC: 3.6 10*6/uL — ABNORMAL LOW (ref 3.70–5.45)
RDW: 14.6 % — ABNORMAL HIGH (ref 11.2–14.5)
WBC: 3.7 10*3/uL — ABNORMAL LOW (ref 3.9–10.3)

## 2016-08-08 NOTE — Progress Notes (Signed)
Follow-up Note: Gyn-Onc  Consult was initially requested by Dr. Toney Rakes for the evaluation of Jessica Johnston 75 y.o. female  CC:  Chief Complaint  Patient presents with  . malignant neoplasm of fallopian tube , left [facility] )    Follow up    Assessment/Plan:  Jessica Johnston  is a 75 y.o.  year old with stage IIIC left fallopian tube cancer, s/p exploratory laparotomy, TAH, BSO, omentectomy, optimal cytoreductive surgery on 02/18/16 s/p 6 cycles adjuvant carboplatin and paclitaxel completed 07/15/16. Complete clinical response on imaging and labs (CA 125).  I discussed the prognosis of ovarian cancer and the high probability of recurrence. We discussed symptoms consistent with recurrence and to notify me of these should they arise.  I will see her back in 3 months.  Her ANC is adequate (2.0) for Moh's procedure.  HPI: Jessica Johnston is a 75 year old para's woman who is seen in consultation at the request of Dr. Toney Rakes for abdominal peritoneal carcinomatosis and omental caking. The patient has a known history of a 3 cm left ovarian cyst for which she saw my partner approximately 15 months ago. At that time her CA-125 is mildly elevated to 49 in her Roma 1 score was also elevated. However the left ovarian cyst was unilocular, measured only 3 cm, and was long-standing, and therefore suspicion for occult malignancy was low.  In November 2016 she began feeling central and upper abdominal discomfort. Initially she thought it was part of the grieving process she lost her grandson. However when the pain and discomfort persisted she sought evaluation by her providers. A gynecologic evaluation was unremarkable. However eventually she underwent CT scan imaging on 01/27/2016 for this persistent midabdominal pain. CT imaging showed a liver containing a few small low-density lesions which were equivocal for the potential of metastatic disease these were subcentimeter in dimension. There was an extensive  omental nodularity which was highly worrisome for peritoneal carcinomatosis. A 3.5 cm stable left adnexal simple cyst was identified. This was not typical for ovarian cancer. There were no other obvious signs of metastatic disease including no gross ascites.  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 She is not required lymphadenectomy for any of these melanoma lesions. She is no family history significant for breast or ovarian cancer.   On 02/18/16 she underwent an exploratory laparotomy, TAH, BSO, omentectomy, radical tumor debulking and argon beam coagulation of milliary tumor implants. Disease was predominantly in the omentum with milliary studding of the ovaries and peritoneal surfaces. There was a right ovarian cyst measuring 3cm. There was no gross residual disease at completion of surgery with exception of the residual nodules that had received argon beam coagulation.  Postoperatively she did well with no complications.  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 The right ovarian cyst (that had been present in 2015) was benign.   Interval Hx:  She completed 6 cycles of adjuvant chemotherapy with carboplatin and paclitaxel on 07/15/16. There were some treatment delays due to BM toxicity. She is doing well with minimal neuropathy, and mild persistent anemia and neutropenia.  CA 125 was stable and normal at 17.2 on 08/08/16. CT abdo/pelvis on 08/05/16 showed no evidence for persistent disease.   Current Meds:  Outpatient Encounter Prescriptions as of 08/08/2016  Medication Sig  . Cholecalciferol (VITAMIN D) 2000 units CAPS Take 1 capsule by mouth daily.  Marland Kitchen  lidocaine-prilocaine (EMLA) cream Apply 1 application topically as needed.   Facility-Administered Encounter Medications as of 08/08/2016  Medication  . 0.9 %  sodium chloride infusion    Allergy: No Known  Allergies  Social Hx:   Social History   Social History  . Marital status: Married    Spouse name: N/A  . Number of children: 2  . Years of education: N/A   Occupational History  . Not on file.   Social History Main Topics  . Smoking status: Never Smoker  . Smokeless tobacco: Never Used  . Alcohol use 0.0 oz/week     Comment: occ  . Drug use: No  . Sexual activity: No   Other Topics Concern  . Not on file   Social History Narrative  . No narrative on file    Past Surgical Hx:  Past Surgical History:  Procedure Laterality Date  . ABDOMINAL HYSTERECTOMY N/A 02/18/2016   Procedure: HYSTERECTOMY ABDOMINAL;  Surgeon: Everitt Amber, MD;  Location: [facility];  Service: Gynecology;  Laterality: N/A;  . DEBULKING N/A 02/18/2016   Procedure: RADICAL TUMOR DEBULKING;  Surgeon: Everitt Amber, MD;  Location: [facility];  Service: Gynecology;  Laterality: N/A;  . HYSTEROSCOPY    . LAPAROTOMY N/A 02/18/2016   Procedure: EXPLORATORY LAPAROTOMY;  Surgeon: Everitt Amber, MD;  Location: [facility];  Service: Gynecology;  Laterality: N/A;  . omental mass     Biopsy, CT guided  . OMENTECTOMY N/A 02/18/2016   Procedure: OMENTECTOMY;  Surgeon: Everitt Amber, MD;  Location: [facility];  Service: Gynecology;  Laterality: N/A;  . SALPINGOOPHORECTOMY Bilateral 02/18/2016   Procedure: BILATERAL SALPINGO OOPHORECTOMY;  Surgeon: Everitt Amber, MD;  Location: [facility];  Service: Gynecology;  Laterality: Bilateral;  . TONSILLECTOMY AND ADENOIDECTOMY  1948    Past Medical Hx:  Past Medical History:  Diagnosis Date  . Arthritis    arthritis -hands-knees.  . Cancer ([place])    MELANOMA. multiple x5 different sites. last 10 yrs ago.  Marland Kitchen Headache    past hx.-no in awhile.  . High cholesterol   . Melanoma ([place])   . Skin cancer    melanoma    Past Gynecological History:  SVD x 2  No LMP recorded. Patient is postmenopausal.  Family Hx:  Family History  Problem Relation Age of Onset  . Colitis Sister   . Parkinsonism Maternal  Grandmother   . Heart disease Paternal Grandmother   . Heart disease Paternal Grandfather   . Cancer Cousin 12    paternal first cousin  . Skin cancer Son     Review of Systems:  Constitutional  Feels well,    ENT Normal appearing ears and nares bilaterally Skin/Breast  No rash, sores, jaundice, itching, dryness Cardiovascular  No chest pain, shortness of breath, or edema  Pulmonary  No cough or wheeze.  Gastro Intestinal  No abdominal pain or difficulty eating Genito Urinary  No frequency, urgency, dysuria, no bleeding Musculo Skeletal  No myalgia, arthralgia, joint swelling or pain  Neurologic  No weakness, numbness, change in gait,  Psychology  No depression, anxiety, insomnia.   Vitals:  Blood pressure (!) 107/55, pulse 66, temperature 97.6 F (36.4 C), temperature source Oral, resp. rate 18, height 5\' 4"  (1.626 m), weight 137 lb 3.2 oz (62.2 kg), SpO2 100 %.  Physical Exam: WD in NAD Neck  Supple NROM, without any enlargements.  Lymph Node Survey No cervical supraclavicular or inguinal adenopathy Cardiovascular  Pulse normal rate, regularity and rhythm. S1 and  S2 normal.  Lungs  Clear to auscultation bilateraly, without wheezes/crackles/rhonchi. Good air movement.  Skin  No rash/lesions/breakdown  Psychiatry  Alert and oriented to person, place, and time  Abdomen  Normoactive bowel sounds, abdomen soft, non-tender and mildly overweight (BMI 26) without evidence of hernia. Well healed incision. No palpable masses. Back No CVA tenderness Genito Urinary  Vulva/vagina: Normal external female genitalia.  No lesions. No discharge or bleeding.  Bladder/urethra:  No lesions or masses, well supported bladder  Vagina: smooth, no masses or lesions.  Cervix: surgically absent  Uterus:  Surgically absent  Adnexa: no palpable masses. Rectal  No posterior pelvic masses  Extremities  No bilateral cyanosis, clubbing or edema.  Donaciano Eva, MD  08/08/2016,  1:25 PM

## 2016-08-08 NOTE — Patient Instructions (Addendum)
You will receive a phone call from the [facility] about your flush appointments.  We will plan to see you back in the office in three months time or sooner if needed.  Please call for any questions or concerns                                                 Society of Gynecologic Oncology Recommendations   Self Care Plan: What You Can Do to Stay Healthy after Treatment for Ovarian Cancer  Cancer treatments may increase your chance of developing other health problems years after you have completed treatment. The purpose of this self care plan is to inform you about what steps you can take to maintain good health after cancer treatment, including coping with side effects of treatment, reducing the risk of cancer returning, and watching for signs of cancer returning or of a new cancer. Keep in mind that every person treated for cancer is different and that these recommendations are not intended to be a substitute for the advice of a doctor or other health care professional. Please use these recommendations to talk with your doctor and health care team about an appropriate follow-up care plan for you.  Recommendation for Follow-Up for Ovarian Cancer  Have a medical history and physical exam that is focused on detecting signs of cancer recurrence or of new cancers, including a detailed pelvic exam (speculum, pelvic and rectovaginal; however, a routine Pap smear is not recommended for routine cancer follow up). Frequency depends on stage of cancer and other risk factors. For instance, if you had a higher stage of cancer, you may be seen more often. See the table below for general guidelines.  If you had ovarian cancer once, there is a chance that it may come back or spread to other parts of your body. The risk is highest in the first two to three years after treatment, but continues for at least five years. After five years, it is recommended that you have a careful history and physical including pelvic exam  (check-up) every 12 months for the rest of your life.  After cancer treatment, if you feel that something is not right with your body, see your regular doctor, physician assistant or nurse practitioner. Symptoms to report to your health care team include abdominal distension, feeling full easily, new and persistent nausea and vomiting, bloating, vaginal bleeding, rectal bleeding, weight loss without effort, new and persistent pain, new and persistent fatigue, new masses (i.e., bumps in your neck or groin), new and persistent cough and any other concerns. If what you are feeling is urgent, and you cannot get an appointment with your regular health care team, go to an Urgent Care or [facility]. Tell the medical provider you had cancer. Show them a copy of your ovarian cancer treatment summary.  Table reproduced with permission of the [place] of [employer]stetric[employer] and Gynecology.

## 2016-08-16 ENCOUNTER — Telehealth: Payer: Self-pay | Admitting: Oncology

## 2016-08-16 NOTE — Telephone Encounter (Signed)
lvm to inform pt of lab/flush appt 12/15 per LOS

## 2016-08-22 DIAGNOSIS — E559 Vitamin D deficiency, unspecified: Secondary | ICD-10-CM | POA: Diagnosis not present

## 2016-08-22 DIAGNOSIS — R7301 Impaired fasting glucose: Secondary | ICD-10-CM | POA: Diagnosis not present

## 2016-08-22 DIAGNOSIS — E784 Other hyperlipidemia: Secondary | ICD-10-CM | POA: Diagnosis not present

## 2016-08-22 DIAGNOSIS — C4441 Basal cell carcinoma of skin of scalp and neck: Secondary | ICD-10-CM | POA: Diagnosis not present

## 2016-08-25 DIAGNOSIS — M859 Disorder of bone density and structure, unspecified: Secondary | ICD-10-CM | POA: Diagnosis not present

## 2016-08-25 DIAGNOSIS — Z6823 Body mass index (BMI) 23.0-23.9, adult: Secondary | ICD-10-CM | POA: Diagnosis not present

## 2016-08-25 DIAGNOSIS — F4320 Adjustment disorder, unspecified: Secondary | ICD-10-CM | POA: Diagnosis not present

## 2016-08-25 DIAGNOSIS — R7301 Impaired fasting glucose: Secondary | ICD-10-CM | POA: Diagnosis not present

## 2016-08-25 DIAGNOSIS — E784 Other hyperlipidemia: Secondary | ICD-10-CM | POA: Diagnosis not present

## 2016-08-25 DIAGNOSIS — M25519 Pain in unspecified shoulder: Secondary | ICD-10-CM | POA: Diagnosis not present

## 2016-08-25 DIAGNOSIS — Z1389 Encounter for screening for other disorder: Secondary | ICD-10-CM | POA: Diagnosis not present

## 2016-08-25 DIAGNOSIS — C439 Malignant melanoma of skin, unspecified: Secondary | ICD-10-CM | POA: Diagnosis not present

## 2016-08-25 DIAGNOSIS — C5700 Malignant neoplasm of unspecified fallopian tube: Secondary | ICD-10-CM | POA: Diagnosis not present

## 2016-08-25 DIAGNOSIS — Z0000 Encounter for general adult medical examination without abnormal findings: Secondary | ICD-10-CM | POA: Diagnosis not present

## 2016-08-25 DIAGNOSIS — Z23 Encounter for immunization: Secondary | ICD-10-CM | POA: Diagnosis not present

## 2016-08-25 DIAGNOSIS — E559 Vitamin D deficiency, unspecified: Secondary | ICD-10-CM | POA: Diagnosis not present

## 2016-08-26 ENCOUNTER — Encounter (HOSPITAL_COMMUNITY): Payer: Self-pay

## 2016-09-12 ENCOUNTER — Telehealth: Payer: Self-pay | Admitting: Gynecologic Oncology

## 2016-09-12 ENCOUNTER — Telehealth: Payer: Self-pay | Admitting: *Deleted

## 2016-09-12 NOTE — Telephone Encounter (Signed)
Returned call to patient and left message with her upcoming flush appts.

## 2016-09-12 NOTE — Telephone Encounter (Signed)
Pt called to confirm flush appt. Discussed with pt lab at 1015, flush 1030. Pt verbalized understanding

## 2016-09-28 DIAGNOSIS — L570 Actinic keratosis: Secondary | ICD-10-CM | POA: Diagnosis not present

## 2016-09-30 ENCOUNTER — Ambulatory Visit: Payer: Medicare Other

## 2016-09-30 ENCOUNTER — Other Ambulatory Visit (HOSPITAL_BASED_OUTPATIENT_CLINIC_OR_DEPARTMENT_OTHER): Payer: Medicare Other

## 2016-09-30 DIAGNOSIS — Z95828 Presence of other vascular implants and grafts: Secondary | ICD-10-CM

## 2016-09-30 DIAGNOSIS — C5702 Malignant neoplasm of left fallopian tube: Secondary | ICD-10-CM | POA: Diagnosis present

## 2016-09-30 LAB — COMPREHENSIVE METABOLIC PANEL
ALBUMIN: 3.5 g/dL (ref 3.5–5.0)
ALK PHOS: 77 U/L (ref 40–150)
ALT: 21 U/L (ref 0–55)
ANION GAP: 9 meq/L (ref 3–11)
AST: 18 U/L (ref 5–34)
BUN: 11.2 mg/dL (ref 7.0–26.0)
CO2: 24 mEq/L (ref 22–29)
CREATININE: 0.7 mg/dL (ref 0.6–1.1)
Calcium: 9.2 mg/dL (ref 8.4–10.4)
Chloride: 108 mEq/L (ref 98–109)
EGFR: 85 mL/min/{1.73_m2} — AB (ref >90)
GLUCOSE: 92 mg/dL (ref 70–140)
Potassium: 4.1 mEq/L (ref 3.5–5.1)
Sodium: 141 mEq/L (ref 136–145)
TOTAL PROTEIN: 6.6 g/dL (ref 6.4–8.3)
Total Bilirubin: 0.37 mg/dL (ref 0.20–1.20)

## 2016-09-30 LAB — CBC WITH DIFFERENTIAL/PLATELET
BASO%: 0.8 % (ref 0.0–2.0)
Basophils Absolute: 0 10*3/uL (ref 0.0–0.1)
EOS ABS: 0.1 10*3/uL (ref 0.0–0.5)
EOS%: 1.9 % (ref 0.0–7.0)
HCT: 37.6 % (ref 34.8–46.6)
HEMOGLOBIN: 12.2 g/dL (ref 11.6–15.9)
LYMPH#: 1.2 10*3/uL (ref 0.9–3.3)
LYMPH%: 29.3 % (ref 14.0–49.7)
MCH: 30.6 pg (ref 25.1–34.0)
MCHC: 32.5 g/dL (ref 31.5–36.0)
MCV: 94.2 fL (ref 79.5–101.0)
MONO#: 0.4 10*3/uL (ref 0.1–0.9)
MONO%: 9.4 % (ref 0.0–14.0)
NEUT%: 58.6 % (ref 38.4–76.8)
NEUTROS ABS: 2.3 10*3/uL (ref 1.5–6.5)
PLATELETS: 225 10*3/uL (ref 145–400)
RBC: 3.99 10*6/uL (ref 3.70–5.45)
RDW: 13.6 % (ref 11.2–14.5)
WBC: 4 10*3/uL (ref 3.9–10.3)

## 2016-09-30 MED ORDER — HEPARIN SOD (PORK) LOCK FLUSH 100 UNIT/ML IV SOLN
500.0000 [IU] | Freq: Once | INTRAVENOUS | Status: AC | PRN
  Administered 2016-09-30: 500 [IU] via INTRAVENOUS
  Filled 2016-09-30: qty 5

## 2016-09-30 MED ORDER — SODIUM CHLORIDE 0.9 % IJ SOLN
10.0000 mL | INTRAMUSCULAR | Status: DC | PRN
  Administered 2016-09-30: 10 mL via INTRAVENOUS
  Filled 2016-09-30: qty 10

## 2016-10-21 ENCOUNTER — Encounter: Payer: Self-pay | Admitting: Gynecology

## 2016-10-21 ENCOUNTER — Ambulatory Visit (INDEPENDENT_AMBULATORY_CARE_PROVIDER_SITE_OTHER): Payer: Medicare Other | Admitting: Gynecology

## 2016-10-21 VITALS — BP 120/84 | Ht 64.0 in | Wt 136.0 lb

## 2016-10-21 DIAGNOSIS — Z1272 Encounter for screening for malignant neoplasm of vagina: Secondary | ICD-10-CM | POA: Diagnosis not present

## 2016-10-21 DIAGNOSIS — Z01419 Encounter for gynecological examination (general) (routine) without abnormal findings: Secondary | ICD-10-CM | POA: Diagnosis not present

## 2016-10-21 DIAGNOSIS — Z8544 Personal history of malignant neoplasm of other female genital organs: Secondary | ICD-10-CM

## 2016-10-21 NOTE — Progress Notes (Signed)
Jessica Johnston April 28, 1941 [id number]   History:    76 y.o.  for annual gyn exam with no complaints today. Review of patient's record indicated that in May of last year patient underwent an exploratory laparotomy, omentectomy, radical tumor debulking with total, hysterectomy and bilateral salpingo-oophorectomy for a stage IIIc left fallopian tube cancer and received 6 cycles adjuvant carboplatin and paclitaxel completed 07/15/16. GYN oncologist was Dr. Everitt Amber and patient is done well from her surgery. Her PCP is Dr. Elta Guadeloupe Perrini who has been doing her blood work and she is due for bone density study since her last one was several years ago. She reports a normal colonoscopy in 2008. Patient also with past history of melanoma. Patient is informed me that one of her sisters last year was also was diagnosed with ovarian cancer. Patient stated that she went to the genetic counseling after ovarian cancer and had full genetic testing and was negative.  Past medical history,surgical history, family history and social history were all reviewed and documented in the EPIC chart.  Gynecologic History No LMP recorded. Patient is postmenopausal. Contraception: status post hysterectomy Last Pap: Several years ago. Results were: Several years ago Last mammogram: 2017. Results were: normal  Obstetric History OB History  Gravida Para Term Preterm AB Living  3 2     1 2   SAB TAB Ectopic Multiple Live Births  1            # Outcome Date GA Lbr Len/2nd Weight Sex Delivery Anes PTL Lv  3 SAB           2 Para           1 Para                ROS: A ROS was performed and pertinent positives and negatives are included in the history.  GENERAL: No fevers or chills. HEENT: No change in vision, no earache, sore throat or sinus congestion. NECK: No pain or stiffness. CARDIOVASCULAR: No chest pain or pressure. No palpitations. PULMONARY: No shortness of breath, cough or wheeze. GASTROINTESTINAL: No abdominal  pain, nausea, vomiting or diarrhea, melena or bright red blood per rectum. GENITOURINARY: No urinary frequency, urgency, hesitancy or dysuria. MUSCULOSKELETAL: No joint or muscle pain, no back pain, no recent trauma. DERMATOLOGIC: No rash, no itching, no lesions. ENDOCRINE: No polyuria, polydipsia, no heat or cold intolerance. No recent change in weight. HEMATOLOGICAL: No anemia or easy bruising or bleeding. NEUROLOGIC: No headache, seizures, numbness, tingling or weakness. PSYCHIATRIC: No depression, no loss of interest in normal activity or change in sleep pattern.     Exam: chaperone present  BP 120/84   Ht 5\' 4"  (1.626 m)   Wt 136 lb (61.7 kg)   BMI 23.34 kg/m   Body mass index is 23.34 kg/m.  General appearance : Well developed well nourished female. No acute distress HEENT: Eyes: no retinal hemorrhage or exudates,  Neck supple, trachea midline, no carotid bruits, no thyroidmegaly Lungs: Clear to auscultation, no rhonchi or wheezes, or rib retractions  Heart: Regular rate and rhythm, no murmurs or gallops Breast:Examined in sitting and supine position were symmetrical in appearance, no palpable masses or tenderness,  no skin retraction, no nipple inversion, no nipple discharge, no skin discoloration, no axillary or supraclavicular lymphadenopathy Abdomen: no palpable masses or tenderness, no rebound or guarding Extremities: no edema or skin discoloration or tenderness  Pelvic:  Bartholin, Urethra, Skene Glands: Within normal limits  Vagina: No gross lesions or discharge  Cervix: Absent  Uterus  absent  Adnexa  Without masses or tenderness  Anus and perineum  normal   Rectovaginal  normal sphincter tone without palpated masses or tenderness             Hemoccult PCP provides     Assessment/Plan:  76 y.o. female for annual exam with history of stage IIIc left fallopian tube cancer Who completed debulking procedure and staging by GYN oncologist and has completed her  chemotherapy and is doing well. Pap smear was done today. PCP is been doing her blood work. I reminded her to tell her PCP that she needs her bone density study this year so we'll be done in the same [place] for consistency. She's also due for a colonoscopy this year. She was encouraged to do her monthly breast exams. We talked about the calcium vitamin D and weightbearing exercises for osteoporosis prevention.   Terrance Mass MD, 10:01 AM 10/21/2016

## 2016-10-21 NOTE — Patient Instructions (Signed)
Bone Densitometry Introduction Bone densitometry is an imaging test that uses a special X-ray to measure the amount of calcium and other minerals in your bones (bone density). This test is also known as a bone mineral density test or dual-energy X-ray absorptiometry (DXA). The test can measure bone density at your hip and your spine. It is similar to having a regular X-ray. You may have this test to:  Diagnose a condition that causes weak or thin bones (osteoporosis).  Predict your risk of a broken bone (fracture).  Determine how well osteoporosis treatment is working. Tell a health care provider about:  Any allergies you have.  All medicines you are taking, including vitamins, herbs, eye drops, creams, and over-the-counter medicines.  Any problems you or family members have had with anesthetic medicines.  Any blood disorders you have.  Any surgeries you have had.  Any medical conditions you have.  Possibility of pregnancy.  Any other medical test you had within the previous 14 days that used contrast material. What are the risks? Generally, this is a safe procedure. However, problems can occur and may include the following:  This test exposes you to a very small amount of radiation.  The risks of radiation exposure may be greater to unborn children. What happens before the procedure?  Do not take any calcium supplements for 24 hours before having the test. You can otherwise eat and drink what you usually do.  Take off all metal jewelry, eyeglasses, dental appliances, and any other metal objects. What happens during the procedure?  You may lie on an exam table. There will be an X-ray generator below you and an imaging device above you.  Other devices, such as boxes or braces, may be used to position your body properly for the scan.  You will need to lie still while the machine slowly scans your body.  The images will show up on a computer monitor. What happens after the  procedure? You may need more testing at a later time. This information is not intended to replace advice given to you by your health care provider. Make sure you discuss any questions you have with your health care provider. Document Released: 10/25/2004 Document Revised: 03/10/2016 Document Reviewed: 03/13/2014  2017 Elsevier  

## 2016-10-24 ENCOUNTER — Telehealth: Payer: Self-pay | Admitting: *Deleted

## 2016-10-24 LAB — PAP IG W/ RFLX HPV ASCU

## 2016-10-24 NOTE — Telephone Encounter (Signed)
Pt called to ask if she could have cataract surgery as she had last chemo sept 2017. Reviewed with Dr. Marko Plume notified pt ok for cataract surgery. Pt verbalized understanding.

## 2016-10-25 DIAGNOSIS — F4322 Adjustment disorder with anxiety: Secondary | ICD-10-CM | POA: Diagnosis not present

## 2016-10-27 ENCOUNTER — Other Ambulatory Visit: Payer: Self-pay | Admitting: Gynecology

## 2016-10-27 ENCOUNTER — Encounter

## 2016-10-27 DIAGNOSIS — M899 Disorder of bone, unspecified: Secondary | ICD-10-CM

## 2016-10-27 MED ORDER — ERGOCALCIFEROL (VITAMIN D2) 50,000 UNIT CAP
1250 mcg (50,000 unit) | ORAL_CAPSULE | ORAL | 4 refills | Status: DC
Start: 2016-10-27 — End: 2018-01-10

## 2016-10-27 MED ORDER — VENLAFAXINE SR 37.5 MG 24 HR CAP
37.5 mg | ORAL_CAPSULE | Freq: Every day | ORAL | 3 refills | Status: DC
Start: 2016-10-27 — End: 2017-08-25

## 2016-10-27 MED ORDER — VALSARTAN-HYDROCHLOROTHIAZIDE 80 MG-12.5 MG TAB
ORAL_TABLET | Freq: Every day | ORAL | 3 refills | Status: DC
Start: 2016-10-27 — End: 2017-10-05

## 2016-10-27 MED ORDER — ATORVASTATIN 40 MG TAB
40 mg | ORAL_TABLET | Freq: Every day | ORAL | 3 refills | Status: DC
Start: 2016-10-27 — End: 2017-12-28

## 2016-10-27 MED ORDER — ANASTROZOLE 1 MG TAB
1 mg | ORAL_TABLET | Freq: Every day | ORAL | 3 refills | Status: DC
Start: 2016-10-27 — End: 2018-01-02

## 2016-10-27 NOTE — Telephone Encounter (Signed)
From: [name]  To: Esau Grew, DO  Sent: 10/27/2016 11:32 AM EST  Subject:  Medication Renewal Request    Original  authorizing provider: Esau Grew, DO    Dawn Green. Labella would like a refill of the following medications:  ergocalciferol  (VITAMIN D2) 50,000 unit capsule [Dawn Wolters H Niema Carrara, DO]  valsartan-hydroCHLOROthiazide  (DIOVAN-HCT) 80-12.5 mg per tablet [Dawn Vivero H Amenah Tucci, DO]  atorvastatin  (LIPITOR) 40 mg tablet Esau Grew, DO]    Preferred  pharmacy: [facility]:

## 2016-10-28 ENCOUNTER — Inpatient Hospital Stay: Admit: 2016-12-01 | Payer: MEDICARE | Primary: Family Medicine

## 2016-10-28 ENCOUNTER — Ambulatory Visit: Admit: 2016-10-28 | Discharge: 2016-10-28 | Payer: MEDICARE | Attending: Family Medicine | Primary: Family Medicine

## 2016-10-28 DIAGNOSIS — E119 Type 2 diabetes mellitus without complications: Secondary | ICD-10-CM

## 2016-10-28 NOTE — ACP (Advance Care Planning) (Signed)
lw on file

## 2016-10-28 NOTE — Progress Notes (Signed)
Dawn Green is a 76 y.o. female   Chief Complaint   Patient presents with   ??? Labs    pt here for a recheck of her labs and is taking her krill oil bid.  Pt would like to avoid tricor if possible.  Pt does report that she does stress eat and thinks her numbers are going to be worse.  Last A1C was 6.2 and pt will need a new Rx for metformin.  Pt BP is very well controlled  Pt would like to avoid meds if possible. Pt has also started taking zyrtec for post nasal drip and runny nose and the zyrtec is working well.     she is a 76 y.o. year old female who presents for evalution.      Reviewed PmHx, RxHx, FmHx, SocHx, AllgHx and updated and dated in the chart.    Review of Systems - negative except as listed above in the HPI    Objective:     Vitals:    10/28/16 0954   BP: 112/62   Pulse: 78   Resp: 18   Temp: 98.9 ??F (37.2 ??C)   TempSrc: Oral   SpO2: 99%   Weight: 165 lb 6.4 oz (75 kg)   Height: '5\' 3"'$  (1.6 m)       Current Outpatient Prescriptions   Medication Sig   ??? ergocalciferol (VITAMIN D2) 50,000 unit capsule TAKE 1 CAPSULE EVERY 7 DAYS   ??? valsartan-hydroCHLOROthiazide (DIOVAN-HCT) 80-12.5 mg per tablet Take 1 Tab by mouth daily.   ??? atorvastatin (LIPITOR) 40 mg tablet Take 1 Tab by mouth Daily (before breakfast).   ??? anastrozole (ARIMIDEX) 1 mg tablet Take 1 Tab by mouth daily.   ??? venlafaxine-SR (EFFEXOR-XR) 37.5 mg capsule Take 1 Cap by mouth daily. Dose may be taken before bedtime.   ??? Cetirizine (ZYRTEC) 10 mg cap Take  by mouth daily.   ??? alendronate (FOSAMAX) 35 mg tablet Take 1 Tab by mouth every seven (7) days.   ??? krill-om-3-dha-epa-phospho-ast (MEGARED OMEGA-3 KRILL OIL) 1,000-230-60 mg cap Take 1 Tab by mouth two (2) times a day.   ??? brimonidine-timolol (COMBIGAN) 0.2-0.5 % drop ophthalmic solution Administer 1 Drop to both eyes every twelve (12) hours.   ??? aspirin 81 mg chewable tablet Take 81 mg by mouth daily.     No current facility-administered medications for this visit.         Physical Examination: General appearance - alert, well appearing, and in no distress  Mental status - alert, oriented to person, place, and time  Eyes - pupils equal and reactive, extraocular eye movements intact  Chest - clear to auscultation, no wheezes, rales or rhonchi, symmetric air entry  Heart - normal rate, regular rhythm, normal S1, S2, no murmurs, rubs, clicks or gallops  Abdomen - soft, nontender, nondistended, no masses or organomegaly      Assessment/ Plan:   Diagnoses and all orders for this visit:    1. Controlled type 2 diabetes mellitus without complication, without long-term current use of insulin (HCC)  -     HEMOGLOBIN A1C WITH EAG    2. Essential hypertension    3. Mixed hyperlipidemia  -     LIPID PANEL  -     METABOLIC PANEL, COMPREHENSIVE    4. Medicare annual wellness visit, subsequent    5. Screening for alcoholism  -     Annual  Alcohol Screen 15 min (Z6109)  Follow-up Disposition:  Return in about 3 months (around 01/26/2017), or if symptoms worsen or fail to improve.    I have discussed the diagnosis with the patient and the intended plan as seen in the above orders.  The patient has received an after-visit summary and questions were answered concerning future plans. Pt conveyed understanding of plan.    Medication Side Effects and Warnings were discussed with patient      Esau Grew, DO       This is a Subsequent Medicare Annual Wellness Exam (AWV) (Performed 12 months after IPPE or effective date of Medicare Part B enrollment)    I have reviewed the patient's medical history in detail and updated the computerized patient record.     History     Past Medical History:   Diagnosis Date   ??? Breast CA ([place]) 2014    Right - Lumpectomy    ??? Diabetes ([place])    ??? Glaucoma    ??? Hx of mammogram 02/05/2016    Negative per pt. Sees Dr. Laurena Bering    ??? Hyperlipemia    ??? Hypertension    ??? 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 Other ill-defined conditions(799.89) 2016     Mild URI x 4 months (allergies)    ??? Routine Papanicolaou smear 1996    Negative per pt.    ??? Skin cancer     Basal cell on left side of nose and upper lip      Past Surgical History:   Procedure Laterality Date   ??? BREAST SURGERY PROCEDURE UNLISTED Right 2014    Right breast lumpectomy   ??? HX BREAST BIOPSY Left     x 2 negative biopsies    ??? HX CATARACT REMOVAL Bilateral     w/ IOL implants   ??? HX TAH AND BSO  1997    for rapidly growing fibroid ([facility]) - benign.  Dr. Oran Rein.   ??? HX TUBAL LIGATION  1972     Current Outpatient Prescriptions   Medication Sig Dispense Refill   ??? ergocalciferol (VITAMIN D2) 50,000 unit capsule TAKE 1 CAPSULE EVERY 7 DAYS 12 Cap 4   ??? valsartan-hydroCHLOROthiazide (DIOVAN-HCT) 80-12.5 mg per tablet Take 1 Tab by mouth daily. 90 Tab 3   ??? atorvastatin (LIPITOR) 40 mg tablet Take 1 Tab by mouth Daily (before breakfast). 90 Tab 3   ??? anastrozole (ARIMIDEX) 1 mg tablet Take 1 Tab by mouth daily. 90 Tab 3   ??? venlafaxine-SR (EFFEXOR-XR) 37.5 mg capsule Take 1 Cap by mouth daily. Dose may be taken before bedtime. 90 Cap 3   ??? Cetirizine (ZYRTEC) 10 mg cap Take  by mouth daily.     ??? alendronate (FOSAMAX) 35 mg tablet Take 1 Tab by mouth every seven (7) days. 12 Tab 3   ??? krill-om-3-dha-epa-phospho-ast (MEGARED OMEGA-3 KRILL OIL) 1,000-230-60 mg cap Take 1 Tab by mouth two (2) times a day.     ??? brimonidine-timolol (COMBIGAN) 0.2-0.5 % drop ophthalmic solution Administer 1 Drop to both eyes every twelve (12) hours.     ??? aspirin 81 mg chewable tablet Take 81 mg by mouth daily.       Allergies   Allergen Reactions   ??? Shampoo & Body Wash [Skin Cleanser,General] Itching     Paul mitchell     Family History   Problem Relation Age of Onset   ??? Cancer Mother 60     Ovarian -  Passed away in 1966   ??? Other Son 28     Pancreatitis    ??? Cancer Paternal Aunt 97     Pancreatic     Social History   Substance Use Topics   ??? Smoking status: Former Smoker     Packs/day: 1.00     Years: 30.00      Quit date: 04/17/1996   ??? Smokeless tobacco: Never Used      Comment: Never used vapor or e-cigs    ??? Alcohol use 7.0 oz/week     14 Standard drinks or equivalent per week      Comment: 1 gin and tonics per day     Patient Active Problem List   Diagnosis Code   ??? Breast cancer, right ([place]) C50.911   ??? Postmenopausal Z78.0   ??? Hypovitaminosis D E55.9   ??? Essential hypertension I10   ??? Mixed hyperlipidemia E78.2   ??? Elevated fasting glucose R73.01   ??? Controlled type 2 diabetes mellitus without complication, without long-term current use of insulin (HCC) E11.9       Depression Risk Factor Screening:     PHQ over the last two weeks 07/28/2016   PHQ Not Done -   Little interest or pleasure in doing things Not at all   Feeling down, depressed or hopeless Not at all   Total Score PHQ 2 0     Alcohol Risk Factor Screening:   Pt has 1 drink per night    Functional Ability and Level of Safety:   Hearing Loss  Hearing is good.    Activities of Daily Living  The home contains: no safety equipment.  Patient does total self care    Fall Risk  Fall Risk Assessment, last 12 mths 10/28/2016   Able to walk? Yes   Fall in past 12 months? No   Fall with injury? -   Number of falls in past 12 months -   Fall Risk Score -       Abuse Screen  Patient is not abused    Cognitive Screening   Evaluation of Cognitive Function:  Has your family/caregiver stated any concerns about your memory: no  Normal, Clock Drawing test    Patient Care Team   Patient Care Team:  Esau Grew, DO as PCP - General (Family Practice)  Joyce Gross, MD (Hematology and Oncology)  Lyndel Safe, MD as Physician (Radiation Oncology)    Assessment/Plan   Education and counseling provided:  Are appropriate based on today's review and evaluation  End-of-Life planning (with patient's consent)    Diagnoses and all orders for this visit:    1. Controlled type 2 diabetes mellitus without complication, without long-term current use of insulin (HCC)   -     HEMOGLOBIN A1C WITH EAG    2. Essential hypertension    3. Mixed hyperlipidemia  -     LIPID PANEL  -     METABOLIC PANEL, COMPREHENSIVE    4. Medicare annual wellness visit, subsequent    5. Screening for alcoholism  -     Annual  Alcohol Screen 15 min (X3244)        There are no preventive care reminders to display for this patient.  I have discussed the diagnosis with the patient and the intended plan as seen in the above orders.  The patient has received an after-visit summary and questions were answered concerning future plans. Pt conveyed understanding of plan.  Dr Esau Grew

## 2016-10-28 NOTE — Progress Notes (Signed)
Pt here for fasting labs

## 2016-10-28 NOTE — Patient Instructions (Addendum)
Medicare Wellness Visit, Female    The best way to live healthy is to have a healthy lifestyle by eating a well-balanced diet, exercising regularly, limiting alcohol and stopping smoking.    Regular physical exams and screening tests are another way to keep healthy. Preventive exams provided by your health care provider can find health problems before they become diseases or illnesses. Preventive services including immunizations, screening tests, monitoring and exams can help you take care of your own health.    All people over age 55 should have a pneumovax  and and a prevnar shot to prevent pneumonia. These are once in a lifetime unless you and your provider decide differently.    All people over 65 should have a yearly flu shot and a tetanus vaccine every 10 years.    A bone mass density to screen for osteoporosis or thinning of the bones should be done every 2 years after 65.    Screening for diabetes mellitus with a blood sugar test should be done every year.    Glaucoma is a disease of the eye due to increased ocular pressure that can lead to blindness and it should be done every year by an eye professional.    Cardiovascular screening tests that check for elevated lipids (fatty part of blood) which can lead to heart disease and strokes should be done every 5 years.    Colorectal screening that evaluates for blood or polyps in your colon should be done yearly as a stool test or every five years as a flexible sigmoidoscope or every 10 years as a colonoscopy up to age 36.    Breast cancer screening with a mammogram is recommended biennially  for women age 38-74.    Screening for cervical cancer with a pap smear and pelvic exam is recommended for women after age 73 years every 2 years up to age 75 or when the provider and patient decide to stop.If there is a history of cervical abnormalities or other increased risk for cancer then the test is recommended yearly.     Hepatitis C screening is also recommended for anyone born between [birth year] through 1965.    A shingles vaccine is also recommended once in a lifetime after age 36.    Your Medicare Wellness Exam is recommended annually.    Here is a list of your current Health Maintenance items with a due date:  Health Maintenance Due   Topic Date Due   ??? Annual Well Visit  10/02/2016

## 2016-10-29 LAB — METABOLIC PANEL, COMPREHENSIVE
A-G Ratio: 1.7 (ref 1.2–2.2)
ALT (SGPT): 23 IU/L (ref 0–32)
AST (SGOT): 15 IU/L (ref 0–40)
Albumin: 4.2 g/dL (ref 3.5–4.8)
Alk. phosphatase: 55 IU/L (ref 39–117)
BUN/Creatinine ratio: 38 — ABNORMAL HIGH (ref 12–28)
BUN: 25 mg/dL (ref 8–27)
Bilirubin, total: 0.4 mg/dL (ref 0.0–1.2)
CO2: 25 mmol/L (ref 18–29)
Calcium: 9.2 mg/dL (ref 8.7–10.3)
Chloride: 99 mmol/L (ref 96–106)
Creatinine: 0.66 mg/dL (ref 0.57–1.00)
GFR est AA: 100 mL/min/{1.73_m2} (ref >59)
GFR est non-AA: 87 mL/min/{1.73_m2} (ref >59)
GLOBULIN, TOTAL: 2.5 g/dL (ref 1.5–4.5)
Glucose: 131 mg/dL — ABNORMAL HIGH (ref 65–99)
Potassium: 3.7 mmol/L (ref 3.5–5.2)
Protein, total: 6.7 g/dL (ref 6.0–8.5)
Sodium: 141 mmol/L (ref 134–144)

## 2016-10-29 LAB — LIPID PANEL
Cholesterol, total: 183 mg/dL (ref 100–199)
HDL Cholesterol: 39 mg/dL — ABNORMAL LOW (ref >39)
LDL, calculated: 66 mg/dL (ref 0–99)
Triglyceride: 389 mg/dL — ABNORMAL HIGH (ref 0–149)
VLDL, calculated: 78 mg/dL — ABNORMAL HIGH (ref 5–40)

## 2016-10-29 LAB — HEMOGLOBIN A1C WITH EAG
Estimated average glucose: 126 mg/dL
Hemoglobin A1c: 6 % — ABNORMAL HIGH (ref 4.8–5.6)

## 2016-10-29 LAB — DIABETES PATIENT EDUCATION

## 2016-10-29 LAB — CVD REPORT

## 2016-11-01 NOTE — Progress Notes (Signed)
All of your labs have improved so keep working on your diet and the stress eating and we can recheck in 6 months.

## 2016-11-04 ENCOUNTER — Other Ambulatory Visit (HOSPITAL_BASED_OUTPATIENT_CLINIC_OR_DEPARTMENT_OTHER): Payer: Medicare Other

## 2016-11-04 ENCOUNTER — Other Ambulatory Visit: Payer: Self-pay | Admitting: *Deleted

## 2016-11-04 DIAGNOSIS — C5700 Malignant neoplasm of unspecified fallopian tube: Secondary | ICD-10-CM

## 2016-11-04 DIAGNOSIS — C5702 Malignant neoplasm of left fallopian tube: Secondary | ICD-10-CM

## 2016-11-04 DIAGNOSIS — Z95828 Presence of other vascular implants and grafts: Secondary | ICD-10-CM

## 2016-11-04 LAB — CBC WITH DIFFERENTIAL/PLATELET
BASO%: 0.3 % (ref 0.0–2.0)
Basophils Absolute: 0 10*3/uL (ref 0.0–0.1)
EOS%: 3.1 % (ref 0.0–7.0)
Eosinophils Absolute: 0.1 10*3/uL (ref 0.0–0.5)
HCT: 40 % (ref 34.8–46.6)
HGB: 13.2 g/dL (ref 11.6–15.9)
LYMPH#: 1.1 10*3/uL (ref 0.9–3.3)
LYMPH%: 28.3 % (ref 14.0–49.7)
MCH: 30.8 pg (ref 25.1–34.0)
MCHC: 33 g/dL (ref 31.5–36.0)
MCV: 93.2 fL (ref 79.5–101.0)
MONO#: 0.3 10*3/uL (ref 0.1–0.9)
MONO%: 8.5 % (ref 0.0–14.0)
NEUT%: 59.8 % (ref 38.4–76.8)
NEUTROS ABS: 2.3 10*3/uL (ref 1.5–6.5)
Platelets: 205 10*3/uL (ref 145–400)
RBC: 4.29 10*6/uL (ref 3.70–5.45)
RDW: 12.7 % (ref 11.2–14.5)
WBC: 3.9 10*3/uL (ref 3.9–10.3)

## 2016-11-05 LAB — CA 125: CANCER ANTIGEN (CA) 125: 13.6 U/mL (ref 0.0–38.1)

## 2016-11-09 ENCOUNTER — Ambulatory Visit: Payer: Medicare Other | Attending: Gynecologic Oncology | Admitting: Gynecologic Oncology

## 2016-11-09 ENCOUNTER — Encounter: Payer: Self-pay | Admitting: Gynecologic Oncology

## 2016-11-09 VITALS — BP 120/61 | HR 72 | Temp 97.8°F | Resp 18 | Wt 143.1 lb

## 2016-11-09 DIAGNOSIS — Z809 Family history of malignant neoplasm, unspecified: Secondary | ICD-10-CM | POA: Insufficient documentation

## 2016-11-09 DIAGNOSIS — Z8249 Family history of ischemic heart disease and other diseases of the circulatory system: Secondary | ICD-10-CM | POA: Insufficient documentation

## 2016-11-09 DIAGNOSIS — Z8544 Personal history of malignant neoplasm of other female genital organs: Secondary | ICD-10-CM

## 2016-11-09 DIAGNOSIS — Z90710 Acquired absence of both cervix and uterus: Secondary | ICD-10-CM | POA: Diagnosis not present

## 2016-11-09 DIAGNOSIS — Z9889 Other specified postprocedural states: Secondary | ICD-10-CM | POA: Insufficient documentation

## 2016-11-09 DIAGNOSIS — Z5189 Encounter for other specified aftercare: Secondary | ICD-10-CM | POA: Diagnosis not present

## 2016-11-09 DIAGNOSIS — Z8041 Family history of malignant neoplasm of ovary: Secondary | ICD-10-CM | POA: Diagnosis not present

## 2016-11-09 DIAGNOSIS — Z79899 Other long term (current) drug therapy: Secondary | ICD-10-CM | POA: Diagnosis not present

## 2016-11-09 DIAGNOSIS — Z85820 Personal history of malignant melanoma of skin: Secondary | ICD-10-CM | POA: Insufficient documentation

## 2016-11-09 DIAGNOSIS — C5702 Malignant neoplasm of left fallopian tube: Secondary | ICD-10-CM | POA: Diagnosis not present

## 2016-11-09 NOTE — Patient Instructions (Signed)
Follow up in 3 months

## 2016-11-09 NOTE — Progress Notes (Signed)
Follow-up Note: Gyn-Onc  Consult was initially requested by Dr. Toney Rakes for the evaluation of Jessica Johnston 76 y.o. female  CC:  Chief Complaint  Patient presents with  . Malignant Neoplasm of fallopian tube, left    Assessment/Plan:  Jessica Johnston  is a 75 y.o.  year old with stage IIIC left fallopian tube cancer, s/p exploratory laparotomy, TAH, BSO, omentectomy, optimal cytoreductive surgery on 02/18/16 s/p 6 cycles adjuvant carboplatin and paclitaxel completed 07/15/16. BRCA negative. Complete clinical response on imaging and labs (CA 125). No evidence of recurrence.  We discussed symptoms consistent with recurrence and to notify me of these should they arise.  I will see her back in 3 months with CA 125.   HPI: Jessica Johnston is a 76 year old para's woman who is seen in consultation at the request of Dr. Toney Rakes for abdominal peritoneal carcinomatosis and omental caking. The patient has a known history of a 3 cm left ovarian cyst for which she saw my partner approximately 15 months ago. At that time her CA-125 is mildly elevated to 49 in her Roma 1 score was also elevated. However the left ovarian cyst was unilocular, measured only 3 cm, and was long-standing, and therefore suspicion for occult malignancy was low.  In November 2016 she began feeling central and upper abdominal discomfort. Initially she thought it was part of the grieving process she lost her grandson. However when the pain and discomfort persisted she sought evaluation by her providers. A gynecologic evaluation was unremarkable. However eventually she underwent CT scan imaging on 01/27/2016 for this persistent midabdominal pain. CT imaging showed a liver containing a few small low-density lesions which were equivocal for the potential of metastatic disease these were subcentimeter in dimension. There was an extensive omental nodularity which was highly worrisome for peritoneal carcinomatosis. A 3.5 cm stable left adnexal  simple cyst was identified. This was not typical for ovarian cancer. There were no other obvious signs of metastatic disease including no gross ascites.  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 She is not required lymphadenectomy for any of these melanoma lesions. She is no family history significant for breast or ovarian cancer.   On 02/18/16 she underwent an exploratory laparotomy, TAH, BSO, omentectomy, radical tumor debulking and argon beam coagulation of milliary tumor implants. Disease was predominantly in the omentum with milliary studding of the ovaries and peritoneal surfaces. There was a right ovarian cyst measuring 3cm. There was no gross residual disease at completion of surgery with exception of the residual nodules that had received argon beam coagulation.  Postoperatively she did well with no complications.  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 The right ovarian cyst (that had been present in 2015) was benign.   Interval Hx:  She completed 6 cycles of adjuvant chemotherapy with carboplatin and paclitaxel on 07/15/16.   CA 125 was stable and normal at 17.2 on 08/08/16. CT abdo/pelvis on 08/05/16 showed no evidence for persistent disease.  CA 125 was stable and normal at 13.6 on 11/04/16.   Her sister was diagnosed with ovarian cancer in December, 2017. Jessica Johnston is BRCA negative. Her sister is as yet untested.   Current Meds:  Outpatient Encounter Prescriptions as of 11/09/2016  Medication Sig  . Cholecalciferol (VITAMIN D) 2000 units CAPS Take 1 capsule by mouth daily.  Marland Kitchen ezetimibe (ZETIA) 10 MG tablet Take 10  mg by mouth daily.  Marland Kitchen lidocaine-prilocaine (EMLA) cream Apply 1 application topically as needed.   Facility-Administered Encounter Medications as of 11/09/2016  Medication  . 0.9 %  sodium chloride infusion    Allergy: No Known Allergies  Social Hx:    Social History   Social History  . Marital status: Married    Spouse name: N/A  . Number of children: 2  . Years of education: N/A   Occupational History  . Not on file.   Social History Main Topics  . Smoking status: Never Smoker  . Smokeless tobacco: Never Used  . Alcohol use 0.0 oz/week     Comment: occ  . Drug use: No  . Sexual activity: No   Other Topics Concern  . Not on file   Social History Narrative  . No narrative on file    Past Surgical Hx:  Past Surgical History:  Procedure Laterality Date  . ABDOMINAL HYSTERECTOMY N/A 02/18/2016   Procedure: HYSTERECTOMY ABDOMINAL;  Surgeon: Everitt Amber, MD;  Location: [facility];  Service: Gynecology;  Laterality: N/A;  . DEBULKING N/A 02/18/2016   Procedure: RADICAL TUMOR DEBULKING;  Surgeon: Everitt Amber, MD;  Location: [facility];  Service: Gynecology;  Laterality: N/A;  . HYSTEROSCOPY    . LAPAROTOMY N/A 02/18/2016   Procedure: EXPLORATORY LAPAROTOMY;  Surgeon: Everitt Amber, MD;  Location: [facility];  Service: Gynecology;  Laterality: N/A;  . omental mass     Biopsy, CT guided  . OMENTECTOMY N/A 02/18/2016   Procedure: OMENTECTOMY;  Surgeon: Everitt Amber, MD;  Location: [facility];  Service: Gynecology;  Laterality: N/A;  . SALPINGOOPHORECTOMY Bilateral 02/18/2016   Procedure: BILATERAL SALPINGO OOPHORECTOMY;  Surgeon: Everitt Amber, MD;  Location: [facility];  Service: Gynecology;  Laterality: Bilateral;  . TONSILLECTOMY AND ADENOIDECTOMY  1948    Past Medical Hx:  Past Medical History:  Diagnosis Date  . Arthritis    arthritis -hands-knees.  . Cancer ([place])    MELANOMA. multiple x5 different sites. last 10 yrs ago.  Marland Kitchen Headache    past hx.-no in awhile.  . High cholesterol   . Melanoma ([place])   . Skin cancer    melanoma    Past Gynecological History:  SVD x 2  No LMP recorded. Patient is postmenopausal.  Family Hx:  Family History  Problem Relation Age of Onset  . Colitis Sister   . Parkinsonism Maternal Grandmother   . Heart disease  Paternal Grandmother   . Heart disease Paternal Grandfather   . Cancer Cousin 12    paternal first cousin  . Skin cancer Son     Review of Systems:  Constitutional  Feels well,    ENT Normal appearing ears and nares bilaterally Skin/Breast  No rash, sores, jaundice, itching, dryness Cardiovascular  No chest pain, shortness of breath, or edema  Pulmonary  No cough or wheeze.  Gastro Intestinal  No abdominal pain or difficulty eating Genito Urinary  No frequency, urgency, dysuria, no bleeding Musculo Skeletal  No myalgia, arthralgia, joint swelling or pain  Neurologic  No weakness, numbness, change in gait,  Psychology  No depression, anxiety, insomnia.   Vitals:  Blood pressure 120/61, pulse 72, temperature 97.8 F (36.6 C), temperature source Oral, resp. rate 18, weight 143 lb 1.6 oz (64.9 kg), SpO2 100 %.  Physical Exam: WD in NAD Neck  Supple NROM, without any enlargements.  Lymph Node Survey No cervical supraclavicular or inguinal adenopathy Cardiovascular  Pulse normal rate, regularity and rhythm. S1 and  S2 normal.  Lungs  Clear to auscultation bilateraly, without wheezes/crackles/rhonchi. Good air movement.  Skin  No rash/lesions/breakdown  Psychiatry  Alert and oriented to person, place, and time  Abdomen  Normoactive bowel sounds, abdomen soft, non-tender and mildly overweight (BMI 26) without evidence of hernia. Well healed incision. No palpable masses. Back No CVA tenderness Genito Urinary  Vulva/vagina: Normal external female genitalia.  No lesions. No discharge or bleeding.  Bladder/urethra:  No lesions or masses, well supported bladder  Vagina: smooth, no masses or lesions.  Cervix: surgically absent  Uterus:  Surgically absent  Adnexa: no palpable masses. Rectal  No posterior pelvic masses  Extremities  No bilateral cyanosis, clubbing or edema.   30 minutes of direct face to face counseling time was spent with the patient. This included  discussion about prognosis, therapy recommendations.  Donaciano Eva, MD  11/09/2016, 2:18 PM

## 2016-11-10 ENCOUNTER — Ambulatory Visit (INDEPENDENT_AMBULATORY_CARE_PROVIDER_SITE_OTHER): Payer: Medicare Other

## 2016-11-10 ENCOUNTER — Encounter: Payer: Self-pay | Admitting: Gynecology

## 2016-11-10 DIAGNOSIS — M899 Disorder of bone, unspecified: Secondary | ICD-10-CM | POA: Diagnosis not present

## 2016-11-12 DIAGNOSIS — F4322 Adjustment disorder with anxiety: Secondary | ICD-10-CM | POA: Diagnosis not present

## 2016-11-14 DIAGNOSIS — F4322 Adjustment disorder with anxiety: Secondary | ICD-10-CM | POA: Diagnosis not present

## 2016-11-16 ENCOUNTER — Telehealth: Payer: Self-pay | Admitting: Gynecologic Oncology

## 2016-11-16 DIAGNOSIS — F4322 Adjustment disorder with anxiety: Secondary | ICD-10-CM | POA: Diagnosis not present

## 2016-11-16 NOTE — Telephone Encounter (Signed)
Appointment schedule and letter mailed to patient. 11/16/16  °

## 2016-11-25 ENCOUNTER — Other Ambulatory Visit: Payer: Medicare Other

## 2016-11-25 ENCOUNTER — Ambulatory Visit (HOSPITAL_BASED_OUTPATIENT_CLINIC_OR_DEPARTMENT_OTHER): Payer: Medicare Other

## 2016-11-25 DIAGNOSIS — C5702 Malignant neoplasm of left fallopian tube: Secondary | ICD-10-CM | POA: Diagnosis not present

## 2016-11-25 DIAGNOSIS — Z452 Encounter for adjustment and management of vascular access device: Secondary | ICD-10-CM

## 2016-11-25 DIAGNOSIS — Z95828 Presence of other vascular implants and grafts: Secondary | ICD-10-CM

## 2016-11-25 MED ORDER — SODIUM CHLORIDE 0.9% FLUSH
10.0000 mL | INTRAVENOUS | Status: DC | PRN
  Administered 2016-11-25: 10 mL via INTRAVENOUS
  Filled 2016-11-25: qty 10

## 2016-11-25 MED ORDER — HEPARIN SOD (PORK) LOCK FLUSH 100 UNIT/ML IV SOLN
500.0000 [IU] | Freq: Once | INTRAVENOUS | Status: AC | PRN
  Administered 2016-11-25: 500 [IU] via INTRAVENOUS
  Filled 2016-11-25: qty 5

## 2016-12-12 DIAGNOSIS — F4322 Adjustment disorder with anxiety: Secondary | ICD-10-CM | POA: Diagnosis not present

## 2016-12-14 DIAGNOSIS — D3132 Benign neoplasm of left choroid: Secondary | ICD-10-CM | POA: Diagnosis not present

## 2016-12-14 DIAGNOSIS — H04123 Dry eye syndrome of bilateral lacrimal glands: Secondary | ICD-10-CM | POA: Diagnosis not present

## 2016-12-14 DIAGNOSIS — H25813 Combined forms of age-related cataract, bilateral: Secondary | ICD-10-CM | POA: Diagnosis not present

## 2016-12-21 ENCOUNTER — Ambulatory Visit: Admit: 2016-12-21 | Discharge: 2016-12-21 | Payer: MEDICARE | Attending: Specialist | Primary: Family Medicine

## 2016-12-21 DIAGNOSIS — C50911 Malignant neoplasm of unspecified site of right female breast: Secondary | ICD-10-CM

## 2016-12-21 NOTE — Progress Notes (Signed)
[facility]  [address], [place]   [address]  W: [phone]   F: [phone]      f/u HEME/ONC CONSULT    Reason for visit: management of breast cancer     Consulting physician:  Dr. Gilford Rile  Referring physician:  Dr. Minette Brine    HPI:   Dawn Green is a 76 y.o.  female who I am seeing in f/u for management of breast cancer.  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  Right lumpectomy on 04/24/13 showed 1.5 cm IDC, gr 2, no LVI, HER 2 negative (ratio 1.2, sig/cell 3), 0/3 LN involved, DCIS present with extensive intraductal component.    Was on estrogen patch for 10 years, stopped at diagnosis.      oncotype Dx = 23, intermediate, RR = 15%, ER + at 9.7, PR + at 7.5; HER 2 negative at 8.7    05/08/13 re-ex negative    S/p XRT from 06/19/13-07/10/13    Started anastrozole 07/17/13    Interval history: complains of gr 2 loss of appetite, gr 2 fatigue, gr 2 hot flashes, gr 2 insomnia, gr 1 loss of concentration and cognition, gr 1 itching    Normal colonoscopy summer 2015        DX   Encounter Diagnoses   Name Primary?   ??? Malignant neoplasm of right breast in female, estrogen receptor positive, unspecified site of breast ([place]) Yes   ??? Postmenopausal    ??? 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 Mild episode of recurrent major depressive disorder [facility])                 Past Medical History:   Diagnosis Date   ??? Breast CA ([place]) 2014    Right - Lumpectomy    ??? Diabetes ([place])    ??? Glaucoma    ??? Hx of mammogram 02/05/2016    Negative per pt. Sees Dr. Laurena Bering    ??? Hyperlipemia    ??? Hypertension    ??? 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 Other ill-defined conditions(799.89) 2016    Mild URI x 4 months (allergies)    ??? Routine Papanicolaou smear 1996    Negative per pt.    ??? Skin cancer      Basal cell on left side of nose and upper lip     Past Surgical History:   Procedure Laterality Date   ??? BREAST SURGERY PROCEDURE UNLISTED Right 2014    Right breast lumpectomy   ??? HX BREAST BIOPSY Left     x 2 negative biopsies    ??? HX CATARACT REMOVAL Bilateral     w/ IOL implants   ??? HX TAH AND BSO  1997    for rapidly growing fibroid ([facility]) - benign.  Dr. Oran Rein.   ??? HX TUBAL LIGATION  1972     Social History     Social History   ??? Marital status: MARRIED     Spouse name: N/A   ??? Number of children: N/A   ??? Years of education: N/A     Social History Main Topics   ??? Smoking status: Former Smoker     Packs/day: 1.00  Years: 30.00     Quit date: 04/17/1996   ??? Smokeless tobacco: Never Used      Comment: Never used vapor or e-cigs    ??? Alcohol use 7.0 oz/week     14 Standard drinks or equivalent per week      Comment: 1 gin and tonics per day   ??? Drug use: No   ??? Sexual activity: Yes     Partners: Male     Birth control/ protection: Surgical     Other Topics Concern   ??? None     Social History Narrative     Family History   Problem Relation Age of Onset   ??? Cancer Mother 94     Ovarian - Passed away in 12/14/1964   ??? Other Son 84     Pancreatitis    ??? Cancer Paternal Aunt 9     Pancreatic       Current Outpatient Prescriptions   Medication Sig Dispense Refill   ??? ergocalciferol (VITAMIN D2) 50,000 unit capsule TAKE 1 CAPSULE EVERY 7 DAYS 12 Cap 4   ??? valsartan-hydroCHLOROthiazide (DIOVAN-HCT) 80-12.5 mg per tablet Take 1 Tab by mouth daily. 90 Tab 3   ??? atorvastatin (LIPITOR) 40 mg tablet Take 1 Tab by mouth Daily (before breakfast). 90 Tab 3   ??? anastrozole (ARIMIDEX) 1 mg tablet Take 1 Tab by mouth daily. 90 Tab 3   ??? venlafaxine-SR (EFFEXOR-XR) 37.5 mg capsule Take 1 Cap by mouth daily. Dose may be taken before bedtime. 90 Cap 3   ??? Cetirizine (ZYRTEC) 10 mg cap Take  by mouth daily.     ??? alendronate (FOSAMAX) 35 mg tablet Take 1 Tab by mouth every seven (7) days. 12 Tab 3    ??? krill-om-3-dha-epa-phospho-ast (MEGARED OMEGA-3 KRILL OIL) 1,000-230-60 mg cap Take 1 Tab by mouth two (2) times a day.     ??? brimonidine-timolol (COMBIGAN) 0.2-0.5 % drop ophthalmic solution Administer 1 Drop to both eyes every twelve (12) hours.     ??? aspirin 81 mg chewable tablet Take 81 mg by mouth daily.         Allergies   Allergen Reactions   ??? Shampoo & Body Wash [Skin Cleanser,General] Itching     Paul mitchell       Review of Systems    A comprehensive review of systems was performed and all systems were negative except for HPI and for the symptom report form, reviewed and scanned in.        Objective:  Physical Exam:  Visit Vitals   ??? BP 124/61   ??? Pulse 72   ??? Temp 97.6 ??F (36.4 ??C) (Temporal)   ??? Resp 18   ??? Ht 5' 3" (1.6 m)   ??? Wt 166 lb 12.8 oz (75.7 kg)   ??? SpO2 94%   ??? BMI 29.55 kg/m2       General:  Alert, cooperative, no distress, appears stated age.   Head:  Normocephalic, without obvious abnormality, atraumatic.   Eyes:  Conjunctivae/corneas clear. PERRL, EOMs intact.   Throat: Lips, mucosa, and tongue normal.    Neck: Supple, symmetrical, trachea midline, no adenopathy, thyroid: no enlargement/tenderness/nodules   Back:   Symmetric, no curvature. ROM normal. No CVA tenderness.   Lungs:   Clear to auscultation bilaterally.       Heart:  Regular rate and rhythm, S1, S2 normal, no murmur, click, rub or gallop.   Abdomen:   Soft, non-tender. Bowel sounds normal.  No masses,  No organomegaly.   Extremities: Extremities normal, atraumatic, no cyanosis or edema.   Skin: Skin color, texture, turgor normal. No rashes or lesions.   Lymph nodes: Cervical, supraclavicular, and axillary nodes normal.   Neurologic: CNII-XII intact.                                                                     Diagnostic Imaging   No results found for this or any previous visit.    No results found for this or any previous visit.    05/22/13 dexa  Lumbar spine: L1-4   Bone mineral density (gm/cm2): 1.325    % of peak bone mass: 111   % for age matched controls: 131   T score: 1.1   Z score: 2.6   Hip: Right femoral neck   Bone mineral density (gm/cm2): 0.927   % of peak bone mass: 89   % for age matched controls: 68   T score: -0.8   Z score: 0.8   IMPRESSION:   This patient is normal using the World Health Organization criteria   As compared to the prior study, there has been no significant change   10 year probability of major osteoporotic fracture: 8.7%   10 year probability of hip fracture: 0.9%    02/06/15 Bilateral Mammogram  Negative    06/05/15 dexa  Findings:  ??  Femoral Neck: Right  Bone mineral density (gm/cm2):? 0.863  % of peak bone mass: 83  % for age matched controls:? 108  T-score: -1.3  Z-score: 0.5  ??  Total Hip: Right  Bone mineral density (gm/cm2): 0.972  % of peak bone mass: 96  % for age matched controls: 119  T-score: -0.3  Z-score: 1.2  ??  Lumbar Spine: L1-4  Bone mineral density (gm/cm2): 1.146  % of peak bone mass: 96   % for age matched controls: 114  T-score: -0.4  Z-score: 1.1  ??  T score of the distal one third left radius 0.1  ????  ??  IMPRESSION:  ??  This patient is osteopenic using the World Health Organization criteria  As compared to the prior study, there has been a significant 5.2% decrease   in the mean hip and 13.5% decrease in the lumbar spine. Please assess   calcium and vitamin D intake. Please consider secondary causes for   osteoporosis.  10 year probability of major osteoporotic fracture: 10.1%  10 year probability of hip fracture: 1.6%    02/08/16 bilat mammogram  negative    Lab Results  Lab Results   Component Value Date/Time    WBC 4.8 10/02/2015 09:55 AM    HGB 12.6 10/02/2015 09:55 AM    HCT 37.3 10/02/2015 09:55 AM    PLATELET 299 10/02/2015 09:55 AM    MCV 92 10/02/2015 09:55 AM       Lab Results   Component Value Date/Time    Sodium 141 10/28/2016 10:15 AM    Potassium 3.7 10/28/2016 10:15 AM    Chloride 99 10/28/2016 10:15 AM    CO2 25 10/28/2016 10:15 AM     Anion gap 6 04/17/2013 11:01 AM    Glucose 131 (H) 10/28/2016 10:15 AM    BUN 25  10/28/2016 10:15 AM    Creatinine 0.66 10/28/2016 10:15 AM    BUN/Creatinine ratio 38 (H) 10/28/2016 10:15 AM    GFR est AA 100 10/28/2016 10:15 AM    GFR est non-AA 87 10/28/2016 10:15 AM    Calcium 9.2 10/28/2016 10:15 AM    AST (SGOT) 15 10/28/2016 10:15 AM    Alk. phosphatase 55 10/28/2016 10:15 AM    Protein, total 6.7 10/28/2016 10:15 AM    Albumin 4.2 10/28/2016 10:15 AM    A-G Ratio 1.7 10/28/2016 10:15 AM    ALT (SGPT) 23 10/28/2016 10:15 AM       .    Assessment/Plan:  76 y.o. 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  PS 0    1. Breast cancer stage: IA    Hormonal therapy: administered    No evidence of recurrence, continue anastrozole.    Mammogram April 2018 due, ordered today    2. Hot flashes:  stable, currently taking Effexor 37.5 mg daily in the PM.  Did not tolerate gabapentin due to oversleeping    3. Insomnia: drug induced, improved with effexor    4. Osteopenia:  Started fosamax 05/2015; taking vit D 50,000 int units weekly, due in Aug, ordered    5. Fatigue:  May be due to anastrozole, she will call me if it worsens    6. Mild depression, major disorder:  Improved, on effexor    Thank you for this consult.  All of the patient's questions were answered today.          There are no Patient Instructions on file for this visit.   Follow-up Disposition:  Return in about 6 months (around 07/03/2017).    Sonda Rumble MD

## 2016-12-21 NOTE — Progress Notes (Signed)
Dawn Green is a 76 y.o. female here for follow-up of breast cancer.

## 2016-12-28 ENCOUNTER — Encounter

## 2016-12-28 MED ORDER — ALENDRONATE 35 MG TAB
35 mg | ORAL_TABLET | ORAL | 3 refills | Status: DC
Start: 2016-12-28 — End: 2017-11-29

## 2017-01-10 DIAGNOSIS — H25812 Combined forms of age-related cataract, left eye: Secondary | ICD-10-CM | POA: Diagnosis not present

## 2017-01-10 DIAGNOSIS — H268 Other specified cataract: Secondary | ICD-10-CM | POA: Diagnosis not present

## 2017-01-20 ENCOUNTER — Other Ambulatory Visit: Payer: Medicare Other

## 2017-01-20 ENCOUNTER — Ambulatory Visit (HOSPITAL_BASED_OUTPATIENT_CLINIC_OR_DEPARTMENT_OTHER): Payer: Medicare Other

## 2017-01-20 DIAGNOSIS — Z452 Encounter for adjustment and management of vascular access device: Secondary | ICD-10-CM

## 2017-01-20 DIAGNOSIS — Z95828 Presence of other vascular implants and grafts: Secondary | ICD-10-CM

## 2017-01-20 DIAGNOSIS — C5702 Malignant neoplasm of left fallopian tube: Secondary | ICD-10-CM

## 2017-01-20 MED ORDER — HEPARIN SOD (PORK) LOCK FLUSH 100 UNIT/ML IV SOLN
500.0000 [IU] | Freq: Once | INTRAVENOUS | Status: AC | PRN
  Administered 2017-01-20: 500 [IU] via INTRAVENOUS
  Filled 2017-01-20: qty 5

## 2017-01-20 MED ORDER — SODIUM CHLORIDE 0.9% FLUSH
10.0000 mL | INTRAVENOUS | Status: DC | PRN
  Administered 2017-01-20: 10 mL via INTRAVENOUS
  Filled 2017-01-20: qty 10

## 2017-01-21 LAB — CA 125: CANCER ANTIGEN (CA) 125: 13.8 U/mL (ref 0.0–38.1)

## 2017-01-23 ENCOUNTER — Ambulatory Visit: Payer: Medicare Other | Attending: Gynecologic Oncology | Admitting: Gynecologic Oncology

## 2017-01-23 ENCOUNTER — Encounter: Payer: Self-pay | Admitting: Gynecologic Oncology

## 2017-01-23 VITALS — BP 113/63 | HR 63 | Temp 97.6°F | Resp 18 | Wt 137.0 lb

## 2017-01-23 DIAGNOSIS — Z90710 Acquired absence of both cervix and uterus: Secondary | ICD-10-CM | POA: Diagnosis not present

## 2017-01-23 DIAGNOSIS — Z9221 Personal history of antineoplastic chemotherapy: Secondary | ICD-10-CM | POA: Diagnosis not present

## 2017-01-23 DIAGNOSIS — Z8544 Personal history of malignant neoplasm of other female genital organs: Secondary | ICD-10-CM | POA: Diagnosis not present

## 2017-01-23 DIAGNOSIS — C5702 Malignant neoplasm of left fallopian tube: Secondary | ICD-10-CM | POA: Insufficient documentation

## 2017-01-23 DIAGNOSIS — Z90722 Acquired absence of ovaries, bilateral: Secondary | ICD-10-CM | POA: Diagnosis not present

## 2017-01-23 DIAGNOSIS — Z9889 Other specified postprocedural states: Secondary | ICD-10-CM | POA: Insufficient documentation

## 2017-01-23 DIAGNOSIS — Z85820 Personal history of malignant melanoma of skin: Secondary | ICD-10-CM | POA: Diagnosis not present

## 2017-01-23 NOTE — Patient Instructions (Signed)
Dr. Denman George will see you in July with a PAC flush. You will have a Port flush in 6 weeks  You can schedule your next Port flush and visit with Dr. Denman George at that time.

## 2017-01-23 NOTE — Progress Notes (Signed)
Follow-up Note: Gyn-Onc  Consult was initially requested by Dr. Toney Rakes for the evaluation of Jessica Johnston 76 y.o. female  CC:  Chief Complaint  Patient presents with  . Malingnant neoplasm of the fallopian tube , left    Assessment/Plan:  Jessica Johnston  is a 76 y.o.  year old with stage IIIC left fallopian tube cancer, s/p exploratory laparotomy, TAH, BSO, omentectomy, optimal cytoreductive surgery on 02/18/16 s/p 6 cycles adjuvant carboplatin and paclitaxel completed 07/15/16. BRCA negative. Complete clinical response on imaging and labs (CA 125). No evidence of recurrence.  We discussed symptoms consistent with recurrence and to notify me of these should they arise.  I will see her back in 3 months with CA 125. Port flush in 6 weeks.   HPI: Jessica Johnston is a 76 year old para's woman who is seen in consultation at the request of Dr. Toney Rakes for abdominal peritoneal carcinomatosis and omental caking. The patient has a known history of a 3 cm left ovarian cyst for which she saw my partner approximately 15 months ago. At that time her CA-125 is mildly elevated to 49 in her Roma 1 score was also elevated. However the left ovarian cyst was unilocular, measured only 3 cm, and was long-standing, and therefore suspicion for occult malignancy was low.  In November 2016 she began feeling central and upper abdominal discomfort. Initially she thought it was part of the grieving process she lost her grandson. However when the pain and discomfort persisted she sought evaluation by her providers. A gynecologic evaluation was unremarkable. However eventually she underwent CT scan imaging on 01/27/2016 for this persistent midabdominal pain. CT imaging showed a liver containing a few small low-density lesions which were equivocal for the potential of metastatic disease these were subcentimeter in dimension. There was an extensive omental nodularity which was highly worrisome for peritoneal carcinomatosis. A  3.5 cm stable left adnexal simple cyst was identified. This was not typical for ovarian cancer. There were no other obvious signs of metastatic disease including no gross ascites.  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 She is not required lymphadenectomy for any of these melanoma lesions. She is no family history significant for breast or ovarian cancer.   On 02/18/16 she underwent an exploratory laparotomy, TAH, BSO, omentectomy, radical tumor debulking and argon beam coagulation of milliary tumor implants. Disease was predominantly in the omentum with milliary studding of the ovaries and peritoneal surfaces. There was a right ovarian cyst measuring 3cm. There was no gross residual disease at completion of surgery with exception of the residual nodules that had received argon beam coagulation.  Postoperatively she did well with no complications.  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 The right ovarian cyst (that had been present in 2015) was benign.   Interval Hx:  She completed 6 cycles of adjuvant chemotherapy with carboplatin and paclitaxel on 07/15/16.   CA 125 was stable and normal at 17.2 on 08/08/16. CT abdo/pelvis on 08/05/16 showed no evidence for persistent disease.  CA 125 was stable and normal at 13.6 on 11/04/16, and 13.8 on 01/20/17.  Her sister was diagnosed with ovarian cancer in December, 2017. Jessica Johnston is BRCA negative. Her sister is as yet untested.   Current Meds:  Outpatient Encounter Prescriptions as of 01/23/2017  Medication Sig  . Cholecalciferol (VITAMIN D) 2000 units CAPS Take 1 capsule by mouth  daily.  . ezetimibe (ZETIA) 10 MG tablet Take 10 mg by mouth daily.  Marland Kitchen lidocaine-prilocaine (EMLA) cream Apply 1 application topically as needed.   Facility-Administered Encounter Medications as of 01/23/2017  Medication  . 0.9 %  sodium chloride infusion     Allergy: No Known Allergies  Social Hx:   Social History   Social History  . Marital status: Married    Spouse name: N/A  . Number of children: 2  . Years of education: N/A   Occupational History  . Not on file.   Social History Main Topics  . Smoking status: Never Smoker  . Smokeless tobacco: Never Used  . Alcohol use 0.0 oz/week     Comment: occ  . Drug use: No  . Sexual activity: No   Other Topics Concern  . Not on file   Social History Narrative  . No narrative on file    Past Surgical Hx:  Past Surgical History:  Procedure Laterality Date  . ABDOMINAL HYSTERECTOMY N/A 02/18/2016   Procedure: HYSTERECTOMY ABDOMINAL;  Surgeon: Everitt Amber, MD;  Location: [facility];  Service: Gynecology;  Laterality: N/A;  . DEBULKING N/A 02/18/2016   Procedure: RADICAL TUMOR DEBULKING;  Surgeon: Everitt Amber, MD;  Location: [facility];  Service: Gynecology;  Laterality: N/A;  . HYSTEROSCOPY    . LAPAROTOMY N/A 02/18/2016   Procedure: EXPLORATORY LAPAROTOMY;  Surgeon: Everitt Amber, MD;  Location: [facility];  Service: Gynecology;  Laterality: N/A;  . omental mass     Biopsy, CT guided  . OMENTECTOMY N/A 02/18/2016   Procedure: OMENTECTOMY;  Surgeon: Everitt Amber, MD;  Location: [facility];  Service: Gynecology;  Laterality: N/A;  . SALPINGOOPHORECTOMY Bilateral 02/18/2016   Procedure: BILATERAL SALPINGO OOPHORECTOMY;  Surgeon: Everitt Amber, MD;  Location: [facility];  Service: Gynecology;  Laterality: Bilateral;  . TONSILLECTOMY AND ADENOIDECTOMY  1948    Past Medical Hx:  Past Medical History:  Diagnosis Date  . Arthritis    arthritis -hands-knees.  . Cancer ([place])    MELANOMA. multiple x5 different sites. last 10 yrs ago.  Marland Kitchen Headache    past hx.-no in awhile.  . High cholesterol   . Melanoma ([place])   . Osteopenia   . Skin cancer    melanoma    Past Gynecological History:  SVD x 2  No LMP recorded. Patient is postmenopausal.  Family Hx:  Family History  Problem Relation Age of Onset  . Colitis  Sister   . Parkinsonism Maternal Grandmother   . Heart disease Paternal Grandmother   . Heart disease Paternal Grandfather   . Cancer Cousin 12    paternal first cousin  . Skin cancer Son     Review of Systems:  Constitutional  Feels well,    ENT Normal appearing ears and nares bilaterally Skin/Breast  No rash, sores, jaundice, itching, dryness Cardiovascular  No chest pain, shortness of breath, or edema  Pulmonary  No cough or wheeze.  Gastro Intestinal  No abdominal pain or difficulty eating Genito Urinary  No frequency, urgency, dysuria, no bleeding Musculo Skeletal  No myalgia, arthralgia, joint swelling or pain  Neurologic  No weakness, numbness, change in gait,  Psychology  No depression, anxiety, insomnia.   Vitals:  Blood pressure 113/63, pulse 63, temperature 97.6 F (36.4 C), temperature source Oral, resp. rate 18, weight 137 lb (62.1 kg).  Physical Exam: WD in NAD Neck  Supple NROM, without any enlargements.  Lymph Node Survey No cervical supraclavicular or inguinal adenopathy Cardiovascular  Pulse normal rate, regularity and rhythm. S1 and S2 normal.  Lungs  Clear to auscultation bilateraly, without wheezes/crackles/rhonchi. Good air movement.  Skin  No rash/lesions/breakdown  Psychiatry  Alert and oriented to person, place, and time  Abdomen  Normoactive bowel sounds, abdomen soft, non-tender and mildly overweight (BMI 26) without evidence of hernia. Well healed incision. No palpable masses. Back No CVA tenderness Genito Urinary  Vulva/vagina: Normal external female genitalia.  No lesions. No discharge or bleeding.  Bladder/urethra:  No lesions or masses, well supported bladder  Vagina: smooth, no masses or lesions.  Cervix: surgically absent  Uterus:  Surgically absent  Adnexa: no palpable masses. Rectal  No posterior pelvic masses  Extremities  No bilateral cyanosis, clubbing or edema.   30 minutes of direct face to face counseling time  was spent with the patient. This included discussion about prognosis, therapy recommendations.  Donaciano Eva, MD  01/23/2017, 2:22 PM

## 2017-01-24 DIAGNOSIS — F4322 Adjustment disorder with anxiety: Secondary | ICD-10-CM | POA: Diagnosis not present

## 2017-01-26 ENCOUNTER — Ambulatory Visit: Admit: 2017-01-26 | Discharge: 2017-01-26 | Payer: MEDICARE | Attending: Family Medicine | Primary: Family Medicine

## 2017-01-26 ENCOUNTER — Inpatient Hospital Stay: Admit: 2017-05-10 | Payer: MEDICARE | Primary: Family Medicine

## 2017-01-26 DIAGNOSIS — E119 Type 2 diabetes mellitus without complications: Secondary | ICD-10-CM

## 2017-01-26 NOTE — Progress Notes (Signed)
Chief Complaint   Patient presents with   ??? Labs     1. Have you been to the ER, urgent care clinic since your last visit?  Hospitalized since your last visit?No    2. Have you seen or consulted any other health care providers outside of the [facility] since your last visit?  Include any pap smears or colon screening. No

## 2017-01-26 NOTE — Patient Instructions (Addendum)
A Healthy Lifestyle: Care Instructions  Your Care Instructions    A healthy lifestyle can help you feel good, stay at a healthy weight, and have plenty of energy for both work and play. A healthy lifestyle is something you can share with your whole family.  A healthy lifestyle also can lower your risk for serious health problems, such as high blood pressure, heart disease, and diabetes.  You can follow a few steps listed below to improve your health and the health of your family.  Follow-up care is a key part of your treatment and safety. Be sure to make and go to all appointments, and call your doctor if you are having problems. It's also a good idea to know your test results and keep a list of the medicines you take.  How can you care for yourself at home?  ?? Do not eat too much sugar, fat, or fast foods. You can still have dessert and treats now and then. The goal is moderation.  ?? Start small to improve your eating habits. Pay attention to portion sizes, drink less juice and soda pop, and eat more fruits and vegetables.  ?? Eat a healthy amount of food. A 3-ounce serving of meat, for example, is about the size of a deck of cards. Fill the rest of your plate with vegetables and whole grains.  ?? Limit the amount of soda and sports drinks you have every day. Drink more water when you are thirsty.  ?? Eat at least 5 servings of fruits and vegetables every day. It may seem like a lot, but it is not hard to reach this goal. A serving or helping is 1 piece of fruit, 1 cup of vegetables, or 2 cups of leafy, raw vegetables. Have an apple or some carrot sticks as an afternoon snack instead of a candy bar. Try to have fruits and/or vegetables at every meal.  ?? Make exercise part of your daily routine. You may want to start with simple activities, such as walking, bicycling, or slow swimming. Try to be active 30 to 60 minutes every day. You do not need to do all 30 to 60  minutes all at once. For example, you can exercise 3 times a day for 10 or 20 minutes. Moderate exercise is safe for most people, but it is always a good idea to talk to your doctor before starting an exercise program.  ?? Keep moving. Mow the lawn, work in the garden, or clean your house. Take the stairs instead of the elevator at work.  ?? If you smoke, quit. People who smoke have an increased risk for heart attack, stroke, cancer, and other lung illnesses. Quitting is hard, but there are ways to boost your chance of quitting tobacco for good.  ?? Use nicotine gum, patches, or lozenges.  ?? Ask your doctor about stop-smoking programs and medicines.  ?? Keep trying.  In addition to reducing your risk of diseases in the future, you will notice some benefits soon after you stop using tobacco. If you have shortness of breath or asthma symptoms, they will likely get better within a few weeks after you quit.  ?? Limit how much alcohol you drink. Moderate amounts of alcohol (up to 2 drinks a day for men, 1 drink a day for women) are okay. But drinking too much can lead to liver problems, high blood pressure, and other health problems.  Family health  If you have a family, there are many things you   can do together to improve your health.  ?? Eat meals together as a family as often as possible.  ?? Eat healthy foods. This includes fruits, vegetables, lean meats and dairy, and whole grains.  ?? Include your family in your fitness plan. Most people think of activities such as jogging or tennis as the way to fitness, but there are many ways you and your family can be more active. Anything that makes you breathe hard and gets your heart pumping is exercise. Here are some tips:  ?? Walk to do errands or to take your child to school or the bus.  ?? Go for a family bike ride after dinner instead of watching TV.  Where can you learn more?  Go to http://www.healthwise.net/[employer].   Enter U807 in the search box to learn more about "A Healthy Lifestyle: Care Instructions."  Current as of: Feb 26, 2016  Content Version: 11.4  ?? 2006-2017 [employer]. Care instructions adapted under license by [employer] (which disclaims liability or warranty for this information). If you have questions about a medical condition or this instruction, always ask your healthcare professional. [employer]wise, Inc[employer] disclaims any warranty or liability for your use of this information.

## 2017-01-26 NOTE — Progress Notes (Signed)
Dawn Green is a 76 y.o. female   Chief Complaint   Patient presents with   ??? Labs    Pt here for recheck of her diabetes and states has been eating a lot of sweets.  Pt also with elevated trigs and is fasting today for recheck.     Pt also reports issues with her short term memory.  Could not remember shopping with a friend.  Pt is not getting lost going to usual places.  Pt is able to recall with reminders.   Pt is interested in starting prevagen and I would be fine with this.  Discussed formal testing and pt would like to think about this.      she is a 76 y.o. year old female who presents for evalution.      Reviewed PmHx, RxHx, FmHx, SocHx, AllgHx and updated and dated in the chart.    Review of Systems - negative except as listed above in the HPI    Objective:     Vitals:    01/26/17 0903   BP: 115/72   Pulse: 73   Resp: 16   Temp: 97.6 ??F (36.4 ??C)   TempSrc: Oral   SpO2: 95%   Weight: 169 lb (76.7 kg)   Height: '5\' 3"'$  (1.6 m)       Current Outpatient Prescriptions   Medication Sig   ??? alendronate (FOSAMAX) 35 mg tablet TAKE 1 TABLET EVERY 7 DAYS   ??? ergocalciferol (VITAMIN D2) 50,000 unit capsule TAKE 1 CAPSULE EVERY 7 DAYS   ??? valsartan-hydroCHLOROthiazide (DIOVAN-HCT) 80-12.5 mg per tablet Take 1 Tab by mouth daily.   ??? atorvastatin (LIPITOR) 40 mg tablet Take 1 Tab by mouth Daily (before breakfast).   ??? anastrozole (ARIMIDEX) 1 mg tablet Take 1 Tab by mouth daily.   ??? venlafaxine-SR (EFFEXOR-XR) 37.5 mg capsule Take 1 Cap by mouth daily. Dose may be taken before bedtime.   ??? Cetirizine (ZYRTEC) 10 mg cap Take  by mouth daily.   ??? krill-om-3-dha-epa-phospho-ast (MEGARED OMEGA-3 KRILL OIL) 1,000-230-60 mg cap Take 1 Tab by mouth two (2) times a day.   ??? brimonidine-timolol (COMBIGAN) 0.2-0.5 % drop ophthalmic solution Administer 1 Drop to both eyes every twelve (12) hours.   ??? aspirin 81 mg chewable tablet Take 81 mg by mouth daily.   ??? fluorouracil (EFUDEX) 5 % chemo cream       No current facility-administered medications for this visit.        Physical Examination: General appearance - alert, well appearing, and in no distress  Chest - clear to auscultation, no wheezes, rales or rhonchi, symmetric air entry  Heart - normal rate, regular rhythm, normal S1, S2, no murmurs, rubs, clicks or gallops      Assessment/ Plan:   Diagnoses and all orders for this visit:    1. Controlled type 2 diabetes mellitus without complication, without long-term current use of insulin (HCC)  -     HEMOGLOBIN A1C WITH EAG    2. Hypertriglyceridemia  -     LIPID PANEL       Follow-up Disposition:  Return in about 6 months (around 07/28/2017), or if symptoms worsen or fail to improve.    I have discussed the diagnosis with the patient and the intended plan as seen in the above orders.  The patient has received an after-visit summary and questions were answered concerning future plans. Pt conveyed understanding of plan.    Medication Side Effects and Warnings were  discussed with patient      Esau Grew, DO

## 2017-01-27 LAB — LIPID PANEL
Cholesterol, total: 168 mg/dL (ref 100–199)
HDL Cholesterol: 34 mg/dL — ABNORMAL LOW (ref >39)
Triglyceride: 435 mg/dL — ABNORMAL HIGH (ref 0–149)

## 2017-01-27 LAB — HEMOGLOBIN A1C WITH EAG
Estimated average glucose: 128 mg/dL
Hemoglobin A1c: 6.1 % — ABNORMAL HIGH (ref 4.8–5.6)

## 2017-01-27 LAB — CVD REPORT

## 2017-01-27 LAB — DIABETES PATIENT EDUCATION

## 2017-01-27 NOTE — Progress Notes (Signed)
Your prediabetes has worsened slightly and your triglycerides are very high, both of these go along with the increased consumption of sweets.  Recheck in 3-6 months

## 2017-01-31 DIAGNOSIS — H52211 Irregular astigmatism, right eye: Secondary | ICD-10-CM | POA: Diagnosis not present

## 2017-01-31 DIAGNOSIS — H25811 Combined forms of age-related cataract, right eye: Secondary | ICD-10-CM | POA: Diagnosis not present

## 2017-01-31 DIAGNOSIS — H268 Other specified cataract: Secondary | ICD-10-CM | POA: Diagnosis not present

## 2017-02-09 DIAGNOSIS — D0439 Carcinoma in situ of skin of other parts of face: Secondary | ICD-10-CM | POA: Diagnosis not present

## 2017-02-09 DIAGNOSIS — L570 Actinic keratosis: Secondary | ICD-10-CM | POA: Diagnosis not present

## 2017-02-14 ENCOUNTER — Ambulatory Visit: Payer: MEDICARE | Primary: Family Medicine

## 2017-02-16 ENCOUNTER — Inpatient Hospital Stay: Admit: 2017-02-16 | Payer: MEDICARE | Attending: Specialist | Primary: Family Medicine

## 2017-02-16 DIAGNOSIS — Z853 Personal history of malignant neoplasm of breast: Secondary | ICD-10-CM

## 2017-03-01 ENCOUNTER — Encounter: Payer: Self-pay | Admitting: Gynecology

## 2017-03-06 ENCOUNTER — Ambulatory Visit (HOSPITAL_BASED_OUTPATIENT_CLINIC_OR_DEPARTMENT_OTHER): Payer: Medicare Other

## 2017-03-06 DIAGNOSIS — Z8544 Personal history of malignant neoplasm of other female genital organs: Secondary | ICD-10-CM

## 2017-03-06 DIAGNOSIS — Z452 Encounter for adjustment and management of vascular access device: Secondary | ICD-10-CM | POA: Diagnosis present

## 2017-03-06 DIAGNOSIS — Z95828 Presence of other vascular implants and grafts: Secondary | ICD-10-CM

## 2017-03-06 MED ORDER — SODIUM CHLORIDE 0.9% FLUSH
10.0000 mL | Freq: Once | INTRAVENOUS | Status: AC
  Administered 2017-03-06: 10 mL via INTRAVENOUS
  Filled 2017-03-06: qty 10

## 2017-03-06 MED ORDER — HEPARIN SOD (PORK) LOCK FLUSH 100 UNIT/ML IV SOLN
500.0000 [IU] | Freq: Once | INTRAVENOUS | Status: AC
  Administered 2017-03-06: 500 [IU] via INTRAVENOUS
  Filled 2017-03-06: qty 5

## 2017-03-16 DIAGNOSIS — F4322 Adjustment disorder with anxiety: Secondary | ICD-10-CM | POA: Diagnosis not present

## 2017-03-28 ENCOUNTER — Telehealth: Payer: Self-pay | Admitting: *Deleted

## 2017-03-28 DIAGNOSIS — D0362 Melanoma in situ of left upper limb, including shoulder: Secondary | ICD-10-CM | POA: Diagnosis not present

## 2017-03-28 DIAGNOSIS — Z85820 Personal history of malignant melanoma of skin: Secondary | ICD-10-CM | POA: Diagnosis not present

## 2017-03-28 DIAGNOSIS — L814 Other melanin hyperpigmentation: Secondary | ICD-10-CM | POA: Diagnosis not present

## 2017-03-28 DIAGNOSIS — Z08 Encounter for follow-up examination after completed treatment for malignant neoplasm: Secondary | ICD-10-CM | POA: Diagnosis not present

## 2017-03-28 DIAGNOSIS — L570 Actinic keratosis: Secondary | ICD-10-CM | POA: Diagnosis not present

## 2017-03-28 DIAGNOSIS — D485 Neoplasm of uncertain behavior of skin: Secondary | ICD-10-CM | POA: Diagnosis not present

## 2017-03-28 NOTE — Telephone Encounter (Signed)
Patient called and schedueld her follow up appt. Also scheduled her flush/lab appts.Patietn aware of all appts with the dates/times.

## 2017-04-04 DIAGNOSIS — R0781 Pleurodynia: Secondary | ICD-10-CM | POA: Diagnosis not present

## 2017-04-04 DIAGNOSIS — M25531 Pain in right wrist: Secondary | ICD-10-CM | POA: Diagnosis not present

## 2017-04-13 DIAGNOSIS — D0362 Melanoma in situ of left upper limb, including shoulder: Secondary | ICD-10-CM | POA: Diagnosis not present

## 2017-04-14 ENCOUNTER — Ambulatory Visit (HOSPITAL_BASED_OUTPATIENT_CLINIC_OR_DEPARTMENT_OTHER): Payer: Medicare Other

## 2017-04-14 ENCOUNTER — Other Ambulatory Visit: Payer: Medicare Other

## 2017-04-14 DIAGNOSIS — Z452 Encounter for adjustment and management of vascular access device: Secondary | ICD-10-CM | POA: Diagnosis present

## 2017-04-14 DIAGNOSIS — Z95828 Presence of other vascular implants and grafts: Secondary | ICD-10-CM

## 2017-04-14 DIAGNOSIS — C5702 Malignant neoplasm of left fallopian tube: Secondary | ICD-10-CM

## 2017-04-14 MED ORDER — SODIUM CHLORIDE 0.9% FLUSH
10.0000 mL | INTRAVENOUS | Status: DC | PRN
  Administered 2017-04-14: 10 mL via INTRAVENOUS
  Filled 2017-04-14: qty 10

## 2017-04-14 MED ORDER — HEPARIN SOD (PORK) LOCK FLUSH 100 UNIT/ML IV SOLN
500.0000 [IU] | Freq: Once | INTRAVENOUS | Status: AC | PRN
  Administered 2017-04-14: 500 [IU] via INTRAVENOUS
  Filled 2017-04-14: qty 5

## 2017-04-15 LAB — CA 125: CANCER ANTIGEN (CA) 125: 16.1 U/mL (ref 0.0–38.1)

## 2017-04-17 ENCOUNTER — Encounter: Payer: Self-pay | Admitting: Gynecologic Oncology

## 2017-04-17 ENCOUNTER — Ambulatory Visit: Payer: Medicare Other | Attending: Gynecologic Oncology | Admitting: Gynecologic Oncology

## 2017-04-17 VITALS — BP 114/52 | HR 76 | Temp 98.0°F | Resp 18 | Wt 152.0 lb

## 2017-04-17 DIAGNOSIS — Z79899 Other long term (current) drug therapy: Secondary | ICD-10-CM | POA: Insufficient documentation

## 2017-04-17 DIAGNOSIS — Z90722 Acquired absence of ovaries, bilateral: Secondary | ICD-10-CM

## 2017-04-17 DIAGNOSIS — Z9221 Personal history of antineoplastic chemotherapy: Secondary | ICD-10-CM | POA: Diagnosis not present

## 2017-04-17 DIAGNOSIS — Z8544 Personal history of malignant neoplasm of other female genital organs: Secondary | ICD-10-CM

## 2017-04-17 DIAGNOSIS — C5702 Malignant neoplasm of left fallopian tube: Secondary | ICD-10-CM | POA: Insufficient documentation

## 2017-04-17 DIAGNOSIS — Z90710 Acquired absence of both cervix and uterus: Secondary | ICD-10-CM

## 2017-04-17 NOTE — Patient Instructions (Signed)
Plan to follow up in three months or sooner if needed.

## 2017-04-17 NOTE — Progress Notes (Signed)
Follow-up Note: Gyn-Onc  Consult was initially requested by Dr. Toney Rakes for the evaluation of Jessica Johnston 76 y.o. female  CC:  Chief Complaint  Patient presents with  . Malignant neoplasm of fallopian tube, left [facility]) C57.02]    Assessment/Plan:  Jessica Johnston  is a 76 y.o.  year old with stage IIIC left fallopian tube cancer, s/p exploratory laparotomy, TAH, BSO, omentectomy, optimal cytoreductive surgery on 02/18/16 s/p 6 cycles adjuvant carboplatin and paclitaxel completed 07/15/16. BRCA negative. Complete clinical response on imaging and labs (CA 125). No evidence of recurrence.  We discussed symptoms consistent with recurrence and to notify me of these should they arise.  I will see her back in 3 months with CA 125. Port flush in 6 weeks.   HPI: Jessica Johnston is a 76 year old para's woman who is seen in consultation at the request of Dr. Toney Rakes for abdominal peritoneal carcinomatosis and omental caking. The patient has a known history of a 3 cm left ovarian cyst for which she saw my partner approximately 15 months ago. At that time her CA-125 is mildly elevated to 49 in her Roma 1 score was also elevated. However the left ovarian cyst was unilocular, measured only 3 cm, and was long-standing, and therefore suspicion for occult malignancy was low.  In November 2016 she began feeling central and upper abdominal discomfort. Initially she thought it was part of the grieving process she lost her grandson. However when the pain and discomfort persisted she sought evaluation by her providers. A gynecologic evaluation was unremarkable. However eventually she underwent CT scan imaging on 01/27/2016 for this persistent midabdominal pain. CT imaging showed a liver containing a few small low-density lesions which were equivocal for the potential of metastatic disease these were subcentimeter in dimension. There was an extensive omental nodularity which was highly worrisome for peritoneal  carcinomatosis. A 3.5 cm stable left adnexal simple cyst was identified. This was not typical for ovarian cancer. There were no other obvious signs of metastatic disease including no gross ascites.  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 She is not required lymphadenectomy for any of these melanoma lesions. She is no family history significant for breast or ovarian cancer.   On 02/18/16 she underwent an exploratory laparotomy, TAH, BSO, omentectomy, radical tumor debulking and argon beam coagulation of milliary tumor implants. Disease was predominantly in the omentum with milliary studding of the ovaries and peritoneal surfaces. There was a right ovarian cyst measuring 3cm. There was no gross residual disease at completion of surgery with exception of the residual nodules that had received argon beam coagulation.  Postoperatively she did well with no complications.  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 The right ovarian cyst (that had been present in 2015) was benign.   She completed 6 cycles of adjuvant chemotherapy with carboplatin and paclitaxel on 07/15/16.   CA 125 was stable and normal at 17.2 on 08/08/16. CT abdo/pelvis on 08/05/16 showed no evidence for persistent disease.  CA 125 was stable and normal at 13.6 on 11/04/16, and 13.8 on 01/20/17.  Her sister was diagnosed with ovarian cancer in December, 2017. Jessica Johnston is BRCA negative. Her sister is as yet untested.   Interval Hx:   She has felt well since I last saw her with no symptoms concerning for recurrence. CA 125 on 04/13/17 was normal at 16.1.  She fell 3 weeks ago and broke a rib. 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  Current Meds:  Outpatient Encounter Prescriptions as of 04/17/2017  Medication Sig  . Cholecalciferol (VITAMIN D) 2000 units CAPS Take 1 capsule by mouth daily.  Marland Kitchen ezetimibe  (ZETIA) 10 MG tablet Take 10 mg by mouth daily.  Marland Kitchen lidocaine-prilocaine (EMLA) cream Apply 1 application topically as needed.   Facility-Administered Encounter Medications as of 04/17/2017  Medication  . 0.9 %  sodium chloride infusion    Allergy: No Known Allergies  Social Hx:   Social History   Social History  . Marital status: Married    Spouse name: N/A  . Number of children: 2  . Years of education: N/A   Occupational History  . Not on file.   Social History Main Topics  . Smoking status: Never Smoker  . Smokeless tobacco: Never Used  . Alcohol use 0.0 oz/week     Comment: occ  . Drug use: No  . Sexual activity: No   Other Topics Concern  . Not on file   Social History Narrative  . No narrative on file    Past Surgical Hx:  Past Surgical History:  Procedure Laterality Date  . ABDOMINAL HYSTERECTOMY N/A 02/18/2016   Procedure: HYSTERECTOMY ABDOMINAL;  Surgeon: Everitt Amber, MD;  Location: [facility];  Service: Gynecology;  Laterality: N/A;  . DEBULKING N/A 02/18/2016   Procedure: RADICAL TUMOR DEBULKING;  Surgeon: Everitt Amber, MD;  Location: [facility];  Service: Gynecology;  Laterality: N/A;  . HYSTEROSCOPY    . LAPAROTOMY N/A 02/18/2016   Procedure: EXPLORATORY LAPAROTOMY;  Surgeon: Everitt Amber, MD;  Location: [facility];  Service: Gynecology;  Laterality: N/A;  . omental mass     Biopsy, CT guided  . OMENTECTOMY N/A 02/18/2016   Procedure: OMENTECTOMY;  Surgeon: Everitt Amber, MD;  Location: [facility];  Service: Gynecology;  Laterality: N/A;  . SALPINGOOPHORECTOMY Bilateral 02/18/2016   Procedure: BILATERAL SALPINGO OOPHORECTOMY;  Surgeon: Everitt Amber, MD;  Location: [facility];  Service: Gynecology;  Laterality: Bilateral;  . TONSILLECTOMY AND ADENOIDECTOMY  1948    Past Medical Hx:  Past Medical History:  Diagnosis Date  . Arthritis    arthritis -hands-knees.  . Cancer ([place])    MELANOMA. multiple x5 different sites. last 10 yrs ago.  Marland Kitchen Headache    past hx.-no in awhile.  . High  cholesterol   . Melanoma ([place])   . Osteopenia   . Skin cancer    melanoma    Past Gynecological History:  SVD x 2  No LMP recorded. Patient is postmenopausal.  Family Hx:  Family History  Problem Relation Age of Onset  . Colitis Sister   . Parkinsonism Maternal Grandmother   . Heart disease Paternal Grandmother   . Heart disease Paternal Grandfather   . Cancer Cousin 12       paternal first cousin  . Skin cancer Son     Review of Systems:  Constitutional  Feels well,    ENT Normal appearing ears and nares bilaterally Skin/Breast  No rash, sores, jaundice, itching, dryness Cardiovascular  No chest pain, shortness of breath, or edema  Pulmonary  No cough or wheeze.  Gastro Intestinal  No abdominal pain or difficulty eating Genito Urinary  No frequency, urgency, dysuria, no bleeding Musculo Skeletal  No myalgia, arthralgia, joint swelling or pain  Neurologic  No weakness, numbness, change in gait,  Psychology  No depression, anxiety, insomnia.  Vitals:  Blood pressure (!) 114/52, pulse 76, temperature 98 F (36.7 C), resp. rate 18, weight 152 lb (68.9 kg), SpO2 98 %.  Physical Exam: WD in NAD Neck  Supple NROM, without any enlargements.  Lymph Node Survey No cervical supraclavicular or inguinal adenopathy Cardiovascular  Pulse normal rate, regularity and rhythm. S1 and S2 normal.  Lungs  Clear to auscultation bilateraly, without wheezes/crackles/rhonchi. Good air movement.  Skin  No rash/lesions/breakdown  Psychiatry  Alert and oriented to person, place, and time  Abdomen  Normoactive bowel sounds, abdomen soft, non-tender and mildly overweight (BMI 26) without evidence of hernia. Well healed incision. No palpable masses. Back No CVA tenderness Genito Urinary  Vulva/vagina: Normal external female genitalia.  No lesions. No discharge or bleeding.  Bladder/urethra:  No lesions or masses, well supported bladder  Vagina: smooth, no masses or  lesions.  Cervix: surgically absent  Uterus:  Surgically absent  Adnexa: no palpable masses. Rectal  No posterior pelvic masses  Extremities  No bilateral cyanosis, clubbing or edema.  Donaciano Eva, MD  04/17/2017, 3:50 PM

## 2017-04-21 ENCOUNTER — Telehealth: Payer: Self-pay | Admitting: Gynecologic Oncology

## 2017-04-21 NOTE — Telephone Encounter (Signed)
Left message for patient.  She had left a message about a PET scan for surveillance.  Advised patient to call for any questions or concerns.

## 2017-05-04 DIAGNOSIS — F4322 Adjustment disorder with anxiety: Secondary | ICD-10-CM | POA: Diagnosis not present

## 2017-05-08 ENCOUNTER — Inpatient Hospital Stay: Admit: 2017-08-07 | Payer: MEDICARE | Primary: Family Medicine

## 2017-05-08 ENCOUNTER — Ambulatory Visit: Admit: 2017-05-08 | Discharge: 2017-05-08 | Payer: MEDICARE | Attending: Family Medicine | Primary: Family Medicine

## 2017-05-08 DIAGNOSIS — E119 Type 2 diabetes mellitus without complications: Secondary | ICD-10-CM

## 2017-05-08 MED ORDER — VARICELLA-ZOSTER GLYCOE VACC-AS01B ADJ(PF) 50 MCG/0.5 ML IM SUSPENSION
50 mcg/0.5 mL | Freq: Once | INTRAMUSCULAR | 0 refills | Status: AC
Start: 2017-05-08 — End: 2017-05-08

## 2017-05-08 NOTE — Progress Notes (Signed)
Chief Complaint   Patient presents with   ??? Labs     fasting     Visit Vitals   ??? BP 130/77 (BP 1 Location: Left arm, BP Patient Position: Sitting)   ??? Pulse 69   ??? Temp 97.8 ??F (36.6 ??C) (Oral)   ??? Resp 14   ??? Ht 5\' 3"  (1.6 m)   ??? Wt 169 lb (76.7 kg)   ??? SpO2 95%   ??? BMI 29.94 kg/m2     1. Have you been to the ER, urgent care clinic since your last visit?  Hospitalized since your last visit? No    2. Have you seen or consulted any other health care providers outside of the [facility] since your last visit?  Include any pap smears or colon screening. No

## 2017-05-08 NOTE — Progress Notes (Signed)
Dawn Green is a 76 y.o. female   Chief Complaint   Patient presents with   ??? Labs     fasting   Pt here for recheck of her diabetes and has been well controlled but her a1c did go up slightly from 6 to 6.1.  Pt did question the statin causing increased sugars and we did discuss thus and benefit of statin out weighs the risk of elevated glucose.  Pt also with high trigs and takes krill oil everyday along with her statin.  Pt also with HTN and has been well controlled but makes her tired so will start to take these meds at night.  Pt has been taking statin In the morning and we discussed taking this in the evening instead.       Pt also reports some pain in her R great toe from a prior injury years ago.  States had been bothering her the past couple weeks but not now.  Pt also reports some R knee pain which comes and goes.  Pt is using 400mg  of motrin with relief.    she is a 76 y.o. year old female who presents for evalution.      Reviewed PmHx, RxHx, FmHx, SocHx, AllgHx and updated and dated in the chart.    Review of Systems - negative except as listed above in the HPI    Objective:     Vitals:    05/08/17 0928   BP: 130/77   Pulse: 69   Resp: 14   Temp: 97.8 ??F (36.6 ??C)   TempSrc: Oral   SpO2: 95%   Weight: 169 lb (76.7 kg)   Height: 5\' 3"  (1.6 m)       Current Outpatient Prescriptions   Medication Sig   ??? varicella-zoster recombinant, PF, (SHINGRIX, PF,) 50 mcg/0.5 mL susr injection 0.5 mL by IntraMUSCular route once for 1 dose. 2 dose series   ??? alendronate (FOSAMAX) 35 mg tablet TAKE 1 TABLET EVERY 7 DAYS   ??? ergocalciferol (VITAMIN D2) 50,000 unit capsule TAKE 1 CAPSULE EVERY 7 DAYS   ??? valsartan-hydroCHLOROthiazide (DIOVAN-HCT) 80-12.5 mg per tablet Take 1 Tab by mouth daily.   ??? atorvastatin (LIPITOR) 40 mg tablet Take 1 Tab by mouth Daily (before breakfast).   ??? anastrozole (ARIMIDEX) 1 mg tablet Take 1 Tab by mouth daily.   ??? venlafaxine-SR (EFFEXOR-XR) 37.5 mg capsule Take 1 Cap by mouth daily.  Dose may be taken before bedtime.   ??? krill-om-3-dha-epa-phospho-ast (MEGARED OMEGA-3 KRILL OIL) 1,000-230-60 mg cap Take 1 Tab by mouth two (2) times a day.   ??? brimonidine-timolol (COMBIGAN) 0.2-0.5 % drop ophthalmic solution Administer 1 Drop to both eyes every twelve (12) hours.   ??? aspirin 81 mg chewable tablet Take 81 mg by mouth daily.   ??? fluorouracil (EFUDEX) 5 % chemo cream    ??? Cetirizine (ZYRTEC) 10 mg cap Take  by mouth daily.     No current facility-administered medications for this visit.        Physical Examination: General appearance - alert, well appearing, and in no distress  Chest - clear to auscultation, no wheezes, rales or rhonchi, symmetric air entry  Heart - normal rate, regular rhythm, normal S1, S2, no murmurs, rubs, clicks or gallops  Abdomen - soft, nontender, nondistended, no masses or organomegaly  Musculoskeletal - no joint tenderness, deformity or swelling, normal exam of R knee      Assessment/ Plan:   Diagnoses and all orders for  this visit:    1. Controlled type 2 diabetes mellitus without complication, without long-term current use of insulin (HCC)  -     MICROALBUMIN, UR, RAND W/ MICROALB/CREAT RATIO  -     HEMOGLOBIN A1C WITH EAG    2. Essential hypertension  Controlled on meds, due to tiredness from med change to evening  3. Mixed hyperlipidemia  -     METABOLIC PANEL, COMPREHENSIVE  -     LDL, DIRECT    4. Hypertriglyceridemia  -     TRIGLYCERIDE    5. Arthritis of knee  motrin prn if worsening consider x-ray  Other orders  -     varicella-zoster recombinant, PF, (SHINGRIX, PF,) 50 mcg/0.5 mL susr injection; 0.5 mL by IntraMUSCular route once for 1 dose. 2 dose series       Follow-up Disposition:  Return in about 6 months (around 11/08/2017), or if symptoms worsen or fail to improve.    I have discussed the diagnosis with the patient and the intended plan as seen in the above orders.  The patient has received an after-visit summary  and questions were answered concerning future plans. Pt conveyed understanding of plan.    Medication Side Effects and Warnings were discussed with patient      Laray Anger Oral Hallgren, DO       Discussed the patient's BMI with her.  The BMI follow up plan is as follows:     dietary management education, guidance, and counseling  encourage exercise  monitor weight  prescribed dietary intake    An After Visit Summary was printed and given to the patient.

## 2017-05-08 NOTE — Patient Instructions (Addendum)
Body Mass Index: Care Instructions  Your Care Instructions    Body mass index (BMI) can help you see if your weight is raising your risk for health problems. It uses a formula to compare how much you weigh with how tall you are.  ?? A BMI lower than 18.5 is considered underweight.  ?? A BMI between 18.5 and 24.9 is considered healthy.  ?? A BMI between 25 and 29.9 is considered overweight. A BMI of 30 or higher is considered obese.  If your BMI is in the normal range, it means that you have a lower risk for weight-related health problems. If your BMI is in the overweight or obese range, you may be at increased risk for weight-related health problems, such as high blood pressure, heart disease, stroke, arthritis or joint pain, and diabetes. If your BMI is in the underweight range, you may be at increased risk for health problems such as fatigue, lower protection (immunity) against illness, muscle loss, bone loss, hair loss, and hormone problems.  BMI is just one measure of your risk for weight-related health problems. You may be at higher risk for health problems if you are not active, you eat an unhealthy diet, or you drink too much alcohol or use tobacco products.  Follow-up care is a key part of your treatment and safety. Be sure to make and go to all appointments, and call your doctor if you are having problems. It's also a good idea to know your test results and keep a list of the medicines you take.  How can you care for yourself at home?  ?? Practice healthy eating habits. This includes eating plenty of fruits, vegetables, whole grains, lean protein, and low-fat dairy.  ?? If your doctor recommends it, get more exercise. Walking is a good choice. Bit by bit, increase the amount you walk every day. Try for at least 30 minutes on most days of the week.  ?? Do not smoke. Smoking can increase your risk for health problems. If you need help quitting, talk to your doctor about stop-smoking programs and  medicines. These can increase your chances of quitting for good.  ?? Limit alcohol to 2 drinks a day for men and 1 drink a day for women. Too much alcohol can cause health problems.  If you have a BMI higher than 25  ?? Your doctor may do other tests to check your risk for weight-related health problems. This may include measuring the distance around your waist. A waist measurement of more than 40 inches in men or 35 inches in women can increase the risk of weight-related health problems.  ?? Talk with your doctor about steps you can take to stay healthy or improve your health. You may need to make lifestyle changes to lose weight and stay healthy, such as changing your diet and getting regular exercise.  If you have a BMI lower than 18.5  ?? Your doctor may do other tests to check your risk for health problems.  ?? Talk with your doctor about steps you can take to stay healthy or improve your health. You may need to make lifestyle changes to gain or maintain weight and stay healthy, such as getting more healthy foods in your diet and doing exercises to build muscle.  Where can you learn more?  Go to http://www.healthwise.net/[employer].  Enter S176 in the search box to learn more about "Body Mass Index: Care Instructions."  Current as of: July 30, 2015  Content Version: 11.4  ??   2006-2017 [employer]. Care instructions adapted under license by [employer] (which disclaims liability or warranty for this information). If you have questions about a medical condition or this instruction, always ask your healthcare professional. [employer] disclaims any warranty or liability for your use of this information.

## 2017-05-09 LAB — LDL, DIRECT: LDL,Direct: 64 mg/dL (ref 0–99)

## 2017-05-09 LAB — METABOLIC PANEL, COMPREHENSIVE
A-G Ratio: 1.8 (ref 1.2–2.2)
ALT (SGPT): 21 IU/L (ref 0–32)
AST (SGOT): 18 IU/L (ref 0–40)
Albumin: 4.4 g/dL (ref 3.5–4.8)
Alk. phosphatase: 59 IU/L (ref 39–117)
BUN/Creatinine ratio: 32 — ABNORMAL HIGH (ref 12–28)
BUN: 18 mg/dL (ref 8–27)
Bilirubin, total: 0.3 mg/dL (ref 0.0–1.2)
CO2: 24 mmol/L (ref 20–29)
Calcium: 9.2 mg/dL (ref 8.7–10.3)
Chloride: 102 mmol/L (ref 96–106)
Creatinine: 0.57 mg/dL (ref 0.57–1.00)
GFR est AA: 105 mL/min/{1.73_m2} (ref >59)
GFR est non-AA: 91 mL/min/{1.73_m2} (ref >59)
GLOBULIN, TOTAL: 2.4 g/dL (ref 1.5–4.5)
Glucose: 127 mg/dL — ABNORMAL HIGH (ref 65–99)
Potassium: 4.7 mmol/L (ref 3.5–5.2)
Protein, total: 6.8 g/dL (ref 6.0–8.5)
Sodium: 144 mmol/L (ref 134–144)

## 2017-05-09 LAB — MICROALBUMIN, UR, RAND W/ MICROALB/CREAT RATIO
Creatinine, urine random: 80.7 mg/dL
Microalb/Creat ratio (ug/mg creat.): 6.9 mg/g creat (ref 0.0–30.0)
Microalbumin, urine: 5.6 ug/mL

## 2017-05-09 LAB — HEMOGLOBIN A1C WITH EAG
Estimated average glucose: 131 mg/dL
Hemoglobin A1c: 6.2 % — ABNORMAL HIGH (ref 4.8–5.6)

## 2017-05-09 LAB — TRIGLYCERIDE: Triglyceride: 393 mg/dL — ABNORMAL HIGH (ref 0–149)

## 2017-05-09 NOTE — Progress Notes (Signed)
Your triglycerides are still high but they are better.  Otherwise your labs are stable and we can recheck in 6 months

## 2017-05-18 DIAGNOSIS — M859 Disorder of bone density and structure, unspecified: Secondary | ICD-10-CM | POA: Diagnosis not present

## 2017-05-29 ENCOUNTER — Telehealth: Payer: Self-pay | Admitting: *Deleted

## 2017-05-29 NOTE — Telephone Encounter (Signed)
Returned patient's call regarding a flush appt. I have scheduled the next two flush appts. Patient aware of the dates/times.

## 2017-05-30 ENCOUNTER — Ambulatory Visit (HOSPITAL_BASED_OUTPATIENT_CLINIC_OR_DEPARTMENT_OTHER): Payer: Medicare Other

## 2017-05-30 DIAGNOSIS — C5702 Malignant neoplasm of left fallopian tube: Secondary | ICD-10-CM

## 2017-05-30 DIAGNOSIS — Z95828 Presence of other vascular implants and grafts: Secondary | ICD-10-CM

## 2017-05-30 DIAGNOSIS — Z4502 Encounter for adjustment and management of automatic implantable cardiac defibrillator: Secondary | ICD-10-CM

## 2017-05-30 MED ORDER — SODIUM CHLORIDE 0.9% FLUSH
10.0000 mL | INTRAVENOUS | Status: DC | PRN
  Administered 2017-05-30: 10 mL via INTRAVENOUS
  Filled 2017-05-30: qty 10

## 2017-05-30 MED ORDER — HEPARIN SOD (PORK) LOCK FLUSH 100 UNIT/ML IV SOLN
500.0000 [IU] | Freq: Once | INTRAVENOUS | Status: AC | PRN
  Administered 2017-05-30: 500 [IU] via INTRAVENOUS
  Filled 2017-05-30: qty 5

## 2017-06-05 ENCOUNTER — Ambulatory Visit: Payer: Medicare Other | Admitting: Gynecologic Oncology

## 2017-06-05 ENCOUNTER — Other Ambulatory Visit: Payer: Self-pay | Admitting: Gynecologic Oncology

## 2017-06-05 DIAGNOSIS — R1909 Other intra-abdominal and pelvic swelling, mass and lump: Secondary | ICD-10-CM

## 2017-06-05 DIAGNOSIS — R222 Localized swelling, mass and lump, trunk: Secondary | ICD-10-CM

## 2017-06-05 NOTE — Progress Notes (Signed)
Patient called this am with reports of a lump palpated above her abdominal incision from ovarian cancer debulking surgery in May 2017.  CT AP ordered per Dr. Denman George to evaluate for hernia vs port site recurrence.

## 2017-06-06 DIAGNOSIS — F4322 Adjustment disorder with anxiety: Secondary | ICD-10-CM | POA: Diagnosis not present

## 2017-06-07 ENCOUNTER — Other Ambulatory Visit: Payer: Self-pay | Admitting: Gynecologic Oncology

## 2017-06-07 ENCOUNTER — Other Ambulatory Visit (HOSPITAL_BASED_OUTPATIENT_CLINIC_OR_DEPARTMENT_OTHER): Payer: Medicare Other

## 2017-06-07 DIAGNOSIS — R1909 Other intra-abdominal and pelvic swelling, mass and lump: Secondary | ICD-10-CM

## 2017-06-07 LAB — BASIC METABOLIC PANEL
ANION GAP: 7 meq/L (ref 3–11)
BUN: 14 mg/dL (ref 7.0–26.0)
CALCIUM: 9.3 mg/dL (ref 8.4–10.4)
CO2: 27 meq/L (ref 22–29)
CREATININE: 0.8 mg/dL (ref 0.6–1.1)
Chloride: 105 mEq/L (ref 98–109)
EGFR: 73 mL/min/{1.73_m2} — AB (ref >90)
GLUCOSE: 124 mg/dL (ref 70–140)
Potassium: 3.8 mEq/L (ref 3.5–5.1)
Sodium: 139 mEq/L (ref 136–145)

## 2017-06-08 ENCOUNTER — Inpatient Hospital Stay: Admit: 2017-06-08 | Payer: MEDICARE | Attending: Specialist | Primary: Family Medicine

## 2017-06-08 DIAGNOSIS — M8580 Other specified disorders of bone density and structure, unspecified site: Secondary | ICD-10-CM

## 2017-06-11 NOTE — Progress Notes (Signed)
Please let her know this is stable

## 2017-06-13 ENCOUNTER — Ambulatory Visit (HOSPITAL_COMMUNITY)
Admission: RE | Admit: 2017-06-13 | Discharge: 2017-06-13 | Disposition: A | Discharge status: Home / Self Care | Payer: Medicare Other | Source: Ambulatory Visit | Attending: Gynecologic Oncology | Admitting: Gynecologic Oncology

## 2017-06-13 DIAGNOSIS — R1909 Other intra-abdominal and pelvic swelling, mass and lump: Secondary | ICD-10-CM | POA: Insufficient documentation

## 2017-06-13 DIAGNOSIS — I700 Atherosclerosis of aorta: Secondary | ICD-10-CM | POA: Diagnosis not present

## 2017-06-13 DIAGNOSIS — K5730 Diverticulosis of large intestine without perforation or abscess without bleeding: Secondary | ICD-10-CM | POA: Diagnosis not present

## 2017-06-13 MED ORDER — IOPAMIDOL (ISOVUE-300) INJECTION 61%
INTRAVENOUS | Status: AC
  Filled 2017-06-13: qty 100

## 2017-06-13 MED ORDER — IOPAMIDOL (ISOVUE-300) INJECTION 61%
100.0000 mL | Freq: Once | INTRAVENOUS | Status: AC | PRN
  Administered 2017-06-13: 100 mL via INTRAVENOUS

## 2017-06-16 DIAGNOSIS — F4322 Adjustment disorder with anxiety: Secondary | ICD-10-CM | POA: Diagnosis not present

## 2017-06-28 ENCOUNTER — Other Ambulatory Visit: Payer: Self-pay | Admitting: Gynecologic Oncology

## 2017-06-28 DIAGNOSIS — C569 Malignant neoplasm of unspecified ovary: Secondary | ICD-10-CM

## 2017-06-30 ENCOUNTER — Ambulatory Visit (HOSPITAL_BASED_OUTPATIENT_CLINIC_OR_DEPARTMENT_OTHER): Payer: Medicare Other

## 2017-06-30 DIAGNOSIS — C5702 Malignant neoplasm of left fallopian tube: Secondary | ICD-10-CM

## 2017-06-30 DIAGNOSIS — Z452 Encounter for adjustment and management of vascular access device: Secondary | ICD-10-CM | POA: Diagnosis not present

## 2017-06-30 DIAGNOSIS — Z95828 Presence of other vascular implants and grafts: Secondary | ICD-10-CM

## 2017-06-30 MED ORDER — HEPARIN SOD (PORK) LOCK FLUSH 100 UNIT/ML IV SOLN
500.0000 [IU] | Freq: Once | INTRAVENOUS | Status: AC
  Administered 2017-06-30: 500 [IU] via INTRAVENOUS
  Filled 2017-06-30: qty 5

## 2017-06-30 MED ORDER — SODIUM CHLORIDE 0.9% FLUSH
10.0000 mL | INTRAVENOUS | Status: DC | PRN
  Administered 2017-06-30: 10 mL via INTRAVENOUS
  Filled 2017-06-30: qty 10

## 2017-06-30 NOTE — Patient Instructions (Signed)

## 2017-07-03 ENCOUNTER — Other Ambulatory Visit (HOSPITAL_BASED_OUTPATIENT_CLINIC_OR_DEPARTMENT_OTHER): Payer: Medicare Other

## 2017-07-03 ENCOUNTER — Encounter: Payer: Self-pay | Admitting: Gynecologic Oncology

## 2017-07-03 ENCOUNTER — Ambulatory Visit: Payer: Medicare Other | Attending: Gynecologic Oncology | Admitting: Gynecologic Oncology

## 2017-07-03 VITALS — BP 110/61 | HR 70 | Temp 97.7°F | Resp 20 | Wt 152.6 lb

## 2017-07-03 DIAGNOSIS — C569 Malignant neoplasm of unspecified ovary: Secondary | ICD-10-CM | POA: Diagnosis not present

## 2017-07-03 DIAGNOSIS — Z85820 Personal history of malignant melanoma of skin: Secondary | ICD-10-CM | POA: Insufficient documentation

## 2017-07-03 DIAGNOSIS — C5702 Malignant neoplasm of left fallopian tube: Secondary | ICD-10-CM | POA: Diagnosis not present

## 2017-07-03 DIAGNOSIS — K432 Incisional hernia without obstruction or gangrene: Secondary | ICD-10-CM

## 2017-07-03 DIAGNOSIS — Z8249 Family history of ischemic heart disease and other diseases of the circulatory system: Secondary | ICD-10-CM | POA: Diagnosis not present

## 2017-07-03 DIAGNOSIS — Z9889 Other specified postprocedural states: Secondary | ICD-10-CM | POA: Diagnosis not present

## 2017-07-03 DIAGNOSIS — Z8544 Personal history of malignant neoplasm of other female genital organs: Secondary | ICD-10-CM | POA: Diagnosis not present

## 2017-07-03 DIAGNOSIS — Z9221 Personal history of antineoplastic chemotherapy: Secondary | ICD-10-CM

## 2017-07-03 DIAGNOSIS — Z90710 Acquired absence of both cervix and uterus: Secondary | ICD-10-CM | POA: Insufficient documentation

## 2017-07-03 NOTE — Patient Instructions (Addendum)
Please notify Dr Denman George at phone number [phone] if you notice vaginal bleeding, new pelvic or abdominal pains, bloating, feeling full easy, or a change in bladder or bowel function.   Plan on having a port flush in six weeks and follow up with Dr. Denman George in three months with a CA 125 before that appt.

## 2017-07-03 NOTE — Progress Notes (Signed)
Follow-up Note: Gyn-Onc  Consult was initially requested by Dr. Toney Rakes for the evaluation of Jessica Johnston 76 y.o. female  CC:  Chief Complaint  Patient presents with  . Malignant neoplasm of fallopian tube, left [facility])    Assessment/Plan:  Jessica Johnston  is a 76 y.o.  year old with stage IIIC left fallopian tube cancer, s/p exploratory laparotomy, TAH, BSO, omentectomy, optimal cytoreductive surgery on 02/18/16 s/p 6 cycles adjuvant carboplatin and paclitaxel completed 07/15/16. BRCA negative. Complete clinical response on imaging and labs (CA 125). No evidence of recurrence. Asymptomatic umbilical incisional hernia. Declines surgical referral.  We discussed symptoms consistent with recurrence and to notify me of these should they arise.  I will see her back in 3 months with CA 125. Port flush in 6 weeks.   HPI: Jessica Johnston is a 76 year old para's woman who is seen in consultation at the request of Dr. Toney Rakes for abdominal peritoneal carcinomatosis and omental caking. The patient has a known history of a 3 cm left ovarian cyst for which she saw my partner approximately 15 months ago. At that time her CA-125 is mildly elevated to 49 in her Roma 1 score was also elevated. However the left ovarian cyst was unilocular, measured only 3 cm, and was long-standing, and therefore suspicion for occult malignancy was low.  In November 2016 she began feeling central and upper abdominal discomfort. Initially she thought it was part of the grieving process she lost her grandson. However when the pain and discomfort persisted she sought evaluation by her providers. A gynecologic evaluation was unremarkable. However eventually she underwent CT scan imaging on 01/27/2016 for this persistent midabdominal pain. CT imaging showed a liver containing a few small low-density lesions which were equivocal for the potential of metastatic disease these were subcentimeter in dimension. There was an extensive omental  nodularity which was highly worrisome for peritoneal carcinomatosis. A 3.5 cm stable left adnexal simple cyst was identified. This was not typical for ovarian cancer. There were no other obvious signs of metastatic disease including no gross ascites.  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 She is not required lymphadenectomy for any of these melanoma lesions. She is no family history significant for breast or ovarian cancer.   On 02/18/16 she underwent an exploratory laparotomy, TAH, BSO, omentectomy, radical tumor debulking and argon beam coagulation of milliary tumor implants. Disease was predominantly in the omentum with milliary studding of the ovaries and peritoneal surfaces. There was a right ovarian cyst measuring 3cm. There was no gross residual disease at completion of surgery with exception of the residual nodules that had received argon beam coagulation.  Postoperatively she did well with no complications.  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 The right ovarian cyst (that had been present in 2015) was benign.   She completed 6 cycles of adjuvant chemotherapy with carboplatin and paclitaxel on 07/15/16.   CA 125 was stable and normal at 17.2 on 08/08/16. CT abdo/pelvis on 08/05/16 showed no evidence for persistent disease.  CA 125 was stable and normal at 13.6 on 11/04/16, and 13.8 on 01/20/17.  Her sister was diagnosed with ovarian cancer in December, 2017. Jessica Johnston is BRCA negative. Her sister is as yet untested.   Interval Hx:   She has felt well since I last saw her with no symptoms concerning for recurrence. CA 125 on  04/13/17 was normal at 16.1.  CT abdo/pelvis on 06/20/17 showed a small supraumbilical hernia, but no other abnormalities and no evidence of metastatic or recurrent disease.  Current Meds:  Outpatient Encounter Prescriptions as of 07/03/2017   Medication Sig  . Cholecalciferol (VITAMIN D) 2000 units CAPS Take 1 capsule by mouth daily.  Marland Kitchen ezetimibe (ZETIA) 10 MG tablet Take 10 mg by mouth daily.  Marland Kitchen lidocaine-prilocaine (EMLA) cream Apply 1 application topically as needed.   Facility-Administered Encounter Medications as of 07/03/2017  Medication  . 0.9 %  sodium chloride infusion    Allergy: No Known Allergies  Social Hx:   Social History   Social History  . Marital status: Married    Spouse name: N/A  . Number of children: 2  . Years of education: N/A   Occupational History  . Not on file.   Social History Main Topics  . Smoking status: Never Smoker  . Smokeless tobacco: Never Used  . Alcohol use 0.0 oz/week     Comment: occ  . Drug use: No  . Sexual activity: No   Other Topics Concern  . Not on file   Social History Narrative  . No narrative on file    Past Surgical Hx:  Past Surgical History:  Procedure Laterality Date  . ABDOMINAL HYSTERECTOMY N/A 02/18/2016   Procedure: HYSTERECTOMY ABDOMINAL;  Surgeon: Everitt Amber, MD;  Location: [facility];  Service: Gynecology;  Laterality: N/A;  . DEBULKING N/A 02/18/2016   Procedure: RADICAL TUMOR DEBULKING;  Surgeon: Everitt Amber, MD;  Location: [facility];  Service: Gynecology;  Laterality: N/A;  . HYSTEROSCOPY    . LAPAROTOMY N/A 02/18/2016   Procedure: EXPLORATORY LAPAROTOMY;  Surgeon: Everitt Amber, MD;  Location: [facility];  Service: Gynecology;  Laterality: N/A;  . omental mass     Biopsy, CT guided  . OMENTECTOMY N/A 02/18/2016   Procedure: OMENTECTOMY;  Surgeon: Everitt Amber, MD;  Location: [facility];  Service: Gynecology;  Laterality: N/A;  . SALPINGOOPHORECTOMY Bilateral 02/18/2016   Procedure: BILATERAL SALPINGO OOPHORECTOMY;  Surgeon: Everitt Amber, MD;  Location: [facility];  Service: Gynecology;  Laterality: Bilateral;  . TONSILLECTOMY AND ADENOIDECTOMY  1948    Past Medical Hx:  Past Medical History:  Diagnosis Date  . Arthritis    arthritis -hands-knees.  . Cancer ([place])     MELANOMA. multiple x5 different sites. last 10 yrs ago.  Marland Kitchen Headache    past hx.-no in awhile.  . High cholesterol   . Melanoma ([place])   . Osteopenia   . Skin cancer    melanoma    Past Gynecological History:  SVD x 2  No LMP recorded. Patient is postmenopausal.  Family Hx:  Family History  Problem Relation Age of Onset  . Colitis Sister   . Parkinsonism Maternal Grandmother   . Heart disease Paternal Grandmother   . Heart disease Paternal Grandfather   . Cancer Cousin 12       paternal first cousin  . Skin cancer Son     Review of Systems:  Constitutional  Feels well,    ENT Normal appearing ears and nares bilaterally Skin/Breast  No rash, sores, jaundice, itching, dryness Cardiovascular  No chest pain, shortness of breath, or edema  Pulmonary  No cough or wheeze.  Gastro Intestinal  No abdominal pain or difficulty eating Genito Urinary  No frequency, urgency, dysuria, no bleeding Musculo Skeletal  No myalgia, arthralgia, joint swelling or pain  Neurologic  No weakness, numbness, change in gait,  Psychology  No depression, anxiety, insomnia.   Vitals:  Blood pressure 110/61, pulse 70, temperature 97.7 F (36.5 C), temperature source Oral, resp. rate 20, weight 152 lb 9.6 oz (69.2 kg), SpO2 99 %.  Physical Exam: WD in NAD Neck  Supple NROM, without any enlargements.  Lymph Node Survey No cervical supraclavicular or inguinal adenopathy Cardiovascular  Pulse normal rate, regularity and rhythm. S1 and S2 normal.  Lungs  Clear to auscultation bilateraly, without wheezes/crackles/rhonchi. Good air movement.  Skin  No rash/lesions/breakdown  Psychiatry  Alert and oriented to person, place, and time  Abdomen  Normoactive bowel sounds, abdomen soft, non-tender and mildly overweight (BMI 26) with soft reducible ventral hernia at level of umbilicus. Well healed incision. No palpable masses. Back No CVA tenderness Genito Urinary  Vulva/vagina: Normal  external female genitalia.  No lesions. No discharge or bleeding.  Bladder/urethra:  No lesions or masses, well supported bladder  Vagina: smooth, no masses or lesions.  Cervix: surgically absent  Uterus:  Surgically absent  Adnexa: no palpable masses. Rectal  No posterior pelvic masses  Extremities  No bilateral cyanosis, clubbing or edema.  Donaciano Eva, MD  07/03/2017, 3:40 PM

## 2017-07-04 ENCOUNTER — Telehealth: Payer: Self-pay | Admitting: Gynecologic Oncology

## 2017-07-04 DIAGNOSIS — F4322 Adjustment disorder with anxiety: Secondary | ICD-10-CM | POA: Diagnosis not present

## 2017-07-04 LAB — CA 125: CANCER ANTIGEN (CA) 125: 18.4 U/mL (ref 0.0–38.1)

## 2017-07-04 NOTE — Telephone Encounter (Signed)
Message left with CA 125 results.  Advised to call for any needs.

## 2017-07-05 ENCOUNTER — Encounter: Attending: Specialist | Primary: Family Medicine

## 2017-07-12 ENCOUNTER — Ambulatory Visit: Admit: 2017-07-12 | Discharge: 2017-07-12 | Payer: MEDICARE | Attending: Specialist | Primary: Family Medicine

## 2017-07-12 DIAGNOSIS — Z170 Estrogen receptor positive status [ER+]: Secondary | ICD-10-CM

## 2017-07-12 NOTE — Progress Notes (Signed)
Dawn Green is a 76 y.o. female      Patient would like for Efurex chemo  Cream to be removed from her medication list.   Chief Complaint   Patient presents with   ??? Breast Cancer     6 month follow up       1. Have you been to the ER, urgent care clinic since your last visit?  Hospitalized since your last visit?No  M  2. Have you seen or consulted any other health care providers outside of the [place] since your last visit?  Include any pap smears or colon screening. No      Visit Vitals   ??? BP 127/62 (BP 1 Location: Left arm, BP Patient Position: Sitting)   ??? Pulse 78   ??? Temp 98.6 ??F (37 ??C) (Oral)   ??? Resp 18   ??? Ht 5\' 3"  (1.6 m)   ??? Wt 169 lb 3.2 oz (76.7 kg)   ??? SpO2 96%   ??? BMI 29.97 kg/m2           Health Maintenance Due   Topic Date Due   ??? Shingrix Vaccine Age 58> (1 of 2) 09/09/1991   ??? EYE EXAM RETINAL OR DILATED Q1  03/04/2017   ??? FOOT EXAM Q1  03/29/2017   ??? Influenza Age 70 to Adult  05/17/2017

## 2017-07-12 NOTE — Progress Notes (Signed)
[facility]  [address], [place]   [address]  W: [phone]   F: [phone]      f/u HEME/ONC CONSULT    Reason for visit: management of breast cancer     Consulting physician:  Dr. Gilford Rile  Referring physician:  Dr. Minette Brine    HPI:   Dawn Green is a 76 y.o.  female who I am seeing in f/u for management of breast cancer.  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  Right lumpectomy on 04/24/13 showed 1.5 cm IDC, gr 2, no LVI, HER 2 negative (ratio 1.2, sig/cell 3), 0/3 LN involved, DCIS present with extensive intraductal component.    Was on estrogen patch for 10 years, stopped at diagnosis.      oncotype Dx = 23, intermediate, RR = 15%, ER + at 9.7, PR + at 7.5; HER 2 negative at 8.7    05/08/13 re-ex negative    S/p XRT from 06/19/13-07/10/13    Started anastrozole 07/17/13    Interval history: complains of gr 2 loss of appetite, gr 2 fatigue, gr 1 insomnia, gr 1 loss of concentration, gr 1 itching    Normal colonoscopy summer 2015        DX   No diagnosis found.             Past Medical History:   Diagnosis Date   ??? Breast CA ([place]) 2014    Right - Lumpectomy    ??? Diabetes ([place])    ??? Glaucoma    ??? Hx of mammogram 02/05/2016    Negative per pt. Sees Dr. Laurena Bering    ??? Hyperlipemia    ??? Hypertension    ??? 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 Other ill-defined conditions(799.89) 2016    Mild URI x 4 months (allergies)    ??? Radiation therapy complication 0272   ??? Routine Papanicolaou smear 1996    Negative per pt.    ??? 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 BREAST SURGERY PROCEDURE UNLISTED Right 2014    Right breast lumpectomy   ??? HX BREAST BIOPSY Left     x 2 negative biopsies     ??? HX CATARACT REMOVAL Bilateral     w/ IOL implants   ??? HX TAH AND BSO  1997    for rapidly growing fibroid (JW) - benign.  Dr. Oran Rein.   ??? HX TUBAL LIGATION  1972     Social History     Social History   ??? Marital status: MARRIED     Spouse name: N/A   ??? Number of children: N/A   ??? Years of education: N/A     Social History Main Topics   ??? Smoking status: Former Smoker     Packs/day: 1.00     Years: 30.00     Quit date: 04/17/1996   ??? Smokeless tobacco: Never Used      Comment: Never used vapor or e-cigs    ??? Alcohol use 7.0 oz/week     14 Standard drinks or equivalent per  week      Comment: 1 gin and tonics per day   ??? Drug use: No   ??? Sexual activity: Yes     Partners: Male     Birth control/ protection: Surgical     Other Topics Concern   ??? None     Social History Narrative     Family History   Problem Relation Age of Onset   ??? Cancer Mother 78     Ovarian - Passed away in Dec 23, 1964   ??? Other Son 19     Pancreatitis    ??? Cancer Paternal Aunt 83     Pancreatic       Current Outpatient Prescriptions   Medication Sig Dispense Refill   ??? Cetirizine (ZYRTEC) 10 mg cap Take  by mouth as needed.     ??? krill-om-3-dha-epa-phospho-ast (MEGARED OMEGA-3 KRILL OIL) 1,000-230-60 mg cap Take 1 Tab by mouth two (2) times a day.     ??? brimonidine-timolol (COMBIGAN) 0.2-0.5 % drop ophthalmic solution Administer 1 Drop to both eyes every twelve (12) hours.     ??? aspirin 81 mg chewable tablet Take 81 mg by mouth daily.     ??? fluorouracil (EFUDEX) 5 % chemo cream Indications: never received     ??? alendronate (FOSAMAX) 35 mg tablet TAKE 1 TABLET EVERY 7 DAYS 12 Tab 3   ??? ergocalciferol (VITAMIN D2) 50,000 unit capsule TAKE 1 CAPSULE EVERY 7 DAYS 12 Cap 4   ??? valsartan-hydroCHLOROthiazide (DIOVAN-HCT) 80-12.5 mg per tablet Take 1 Tab by mouth daily. 90 Tab 3   ??? atorvastatin (LIPITOR) 40 mg tablet Take 1 Tab by mouth Daily (before breakfast). 90 Tab 3   ??? anastrozole (ARIMIDEX) 1 mg tablet Take 1 Tab by mouth daily. 90 Tab 3    ??? venlafaxine-SR (EFFEXOR-XR) 37.5 mg capsule Take 1 Cap by mouth daily. Dose may be taken before bedtime. 90 Cap 3       Allergies   Allergen Reactions   ??? Shampoo & Body Wash [Skin Cleanser,General] Itching     Paul mitchell       Review of Systems    A comprehensive review of systems was performed and all systems were negative except for HPI and for the symptom report form, reviewed and scanned in.        Objective:Physical Exam:  Visit Vitals   ??? BP 127/62 (BP 1 Location: Left arm, BP Patient Position: Sitting)   ??? Pulse 78   ??? Temp 98.6 ??F (37 ??C) (Oral)   ??? Resp 18   ??? Ht '5\' 3"'  (1.6 m)   ??? Wt 169 lb 3.2 oz (76.7 kg)   ??? SpO2 96%   ??? BMI 29.97 kg/m2       General:  Alert, cooperative, no distress, appears stated age.   Head:  Normocephalic, without obvious abnormality, atraumatic.   Eyes:  Conjunctivae/corneas clear. PERRL, EOMs intact.   Throat: Lips, mucosa, and tongue normal.    Neck: Supple, symmetrical, trachea midline, no adenopathy, thyroid: no enlargement/tenderness/nodules   Back:   Symmetric, no curvature. ROM normal. No CVA tenderness.   Lungs:   Clear to auscultation bilaterally.       Heart:  Regular rate and rhythm, S1, S2 normal, no murmur, click, rub or gallop.   Abdomen:   Soft, non-tender. Bowel sounds normal. No masses,  No organomegaly.   Extremities: Extremities normal, atraumatic, no cyanosis or edema.   Skin: Skin color, texture, turgor normal. No rashes or  lesions.   Lymph nodes: Cervical, supraclavicular, and axillary nodes normal.   Neurologic: CNII-XII intact.                                                                     Diagnostic Imaging   No results found for this or any previous visit.    No results found for this or any previous visit.    05/22/13 dexa  Lumbar spine: L1-4   Bone mineral density (gm/cm2): 1.325   % of peak bone mass: 111   % for age matched controls: 131   T score: 1.1   Z score: 2.6   Hip: Right femoral neck   Bone mineral density (gm/cm2): 0.927    % of peak bone mass: 89   % for age matched controls: 60   T score: -0.8   Z score: 0.8   IMPRESSION:   This patient is normal using the World Health Organization criteria   As compared to the prior study, there has been no significant change   10 year probability of major osteoporotic fracture: 8.7%   10 year probability of hip fracture: 0.9%    02/06/15 Bilateral Mammogram  Negative    06/08/17 dexa  Findings:  ????  Femoral Neck Left:  Bone mineral density (gm/cm2):  0.882  % of peak bone mass:  85  % for age matched controls:  111  T-score:  -1.1  Z-score:  0.6  ??  Femoral Neck Right:  Bone mineral density (gm/cm2):  0.875  % of peak bone mass:  84  % for age matched controls:  110  T-score:  -1.2  Z-score:  0.6  ????  Total Hip Left:  Bone mineral density (gm/cm2):  0.99  % of peak bone mass:  98  % for age matched controls:  122  T-score:  -0.1  Z-score:  1.4  ??  Total Hip Right:  Bone mineral density (gm/cm2):  0.971  % of peak bone mass:  96  % for age matched controls:  119  T-score:  -0.3  Z-score:  1.2  ??  Lumbar Spine:  L1-4   Bone mineral density (gm/cm2):  1.174  % of peak bone mass:  98  % for age matched controls:  115  T-score:  -0.2  Z-score:  1.3  ??  33% Radius Left:  Bone mineral density (gm/cm2):  0.682  % of peak bone mass:  96  % for age matched controls:  96  T-score:  -0.4  Z-score:  1.9  ??  IMPRESSION  Impression:  ????  This patient is osteopenic using the World Health Organization criteria  The patient has had a prior study.  While no formal comparison is feasible, the  following observations are noted:  No change is observed.    02/16/17 bilat mammogram  negative    Lab Results  Lab Results   Component Value Date/Time    WBC 4.8 10/02/2015 09:55 AM    HGB 12.6 10/02/2015 09:55 AM    HCT 37.3 10/02/2015 09:55 AM    PLATELET 299 10/02/2015 09:55 AM    MCV 92 10/02/2015 09:55 AM       Lab Results   Component Value Date/Time  Sodium 144 05/08/2017 10:09 AM    Potassium 4.7 05/08/2017 10:09 AM     Chloride 102 05/08/2017 10:09 AM    CO2 24 05/08/2017 10:09 AM    Anion gap 6 04/17/2013 11:01 AM    Glucose 127 (H) 05/08/2017 10:09 AM    BUN 18 05/08/2017 10:09 AM    Creatinine 0.57 05/08/2017 10:09 AM    BUN/Creatinine ratio 32 (H) 05/08/2017 10:09 AM    GFR est AA 105 05/08/2017 10:09 AM    GFR est non-AA 91 05/08/2017 10:09 AM    Calcium 9.2 05/08/2017 10:09 AM    AST (SGOT) 18 05/08/2017 10:09 AM    Alk. phosphatase 59 05/08/2017 10:09 AM    Protein, total 6.8 05/08/2017 10:09 AM    Albumin 4.4 05/08/2017 10:09 AM    A-G Ratio 1.8 05/08/2017 10:09 AM    ALT (SGPT) 21 05/08/2017 10:09 AM       .  Assessment/Plan:  76 y.o. 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  PS 0    1. Breast cancer stage: IA    Hormonal therapy: administered    No evidence of recurrence, continue anastrozole.    Mammogram May 2019 due    2. Hot flashes:  stable, currently taking Effexor 37.5 mg daily in the PM.  Did not tolerate gabapentin due to oversleeping    3. Insomnia: drug induced, improved with effexor    4. Osteopenia:  Started fosamax 05/2015; taking vit D 50,000 int units weekly, dexa is stable in 05/2017    5. Fatigue:  May be due to anastrozole, stable    6. Mild depression, major disorder:  Improved, on effexor    Thank you for this consult.  All of the patient's questions were answered today.          There are no Patient Instructions on file for this visit.   Follow-up Disposition:  Return in about 6 months (around 01/09/2018).    Sonda Rumble MD

## 2017-07-27 DIAGNOSIS — J069 Acute upper respiratory infection, unspecified: Secondary | ICD-10-CM | POA: Diagnosis not present

## 2017-07-27 DIAGNOSIS — Z6826 Body mass index (BMI) 26.0-26.9, adult: Secondary | ICD-10-CM | POA: Diagnosis not present

## 2017-07-28 ENCOUNTER — Ambulatory Visit: Admit: 2017-07-28 | Payer: MEDICARE | Attending: Family | Primary: Family Medicine

## 2017-07-28 DIAGNOSIS — L03031 Cellulitis of right toe: Secondary | ICD-10-CM

## 2017-07-28 MED ORDER — MUPIROCIN 2 % OINTMENT
2 % | Freq: Two times a day (BID) | CUTANEOUS | 0 refills | Status: AC
Start: 2017-07-28 — End: 2017-08-07

## 2017-07-28 NOTE — Progress Notes (Signed)
Chief Complaint   Patient presents with   ??? Nail Problem     right great toe     she is a 76 y.o. year old female who presents for evalution.    Yesterday was at nail salon getting toes done, looked like had corn on toe near nail.  Removed and then a lot of pus came out yesterday, today feels better.  Still red and oozing a bit.  Has not tried any OTCs.      Pt has also been having a lot of pain in great toe for many years, happened after injury when stubbed toe.  Joint feels stiff and makes her walk differently.      Reviewed PmHx, RxHx, FmHx, SocHx, AllgHx and updated and dated in the chart.    Review of Systems - negative except as listed above in the HPI    Objective:     Vitals:    07/28/17 1335   BP: 116/64   Pulse: 82   Resp: 18   Temp: 98.1 ??F (36.7 ??C)   TempSrc: Oral   SpO2: 97%   Weight: 169 lb (76.7 kg)   Height: 5\' 3"  (1.6 m)     Physical Examination: General appearance - alert, well appearing, and in no distress  Chest - clear to auscultation, no wheezes, rales or rhonchi, symmetric air entry  Heart - normal rate, regular rhythm, normal S1, S2, no murmurs, rubs, clicks or gallops  Musculoskeletal - abnormal exam of right metotarsalphalangeeal joint  Movements painful, slightly tender, appears enlarged   Skin - pustular infection around great toenail, surrounding erythema and tenderness     Assessment/ Plan:   Diagnoses and all orders for this visit:    1. Paronychia of great toe, right  -     mupirocin (BACTROBAN) 2 % ointment; Apply  to affected area two (2) times a day for 10 days.  New rx.  If no improvement or worsens call back and will send in oral rx.  Take full course of antibiotic.  Apply warm compresses to affected area 2-3 times daily.  Reviewed S/S of worsening infection.  F/U in 2 days if no improvement.    2. Arthralgia of right foot  -     XR GREAT TOE RT MIN 2 V; Future  Obtain x-ray, suspect possible bunion.     Pt voiced understanding regarding plan of care.     Follow-up Disposition:   Return if symptoms worsen or fail to improve.    I have discussed the diagnosis with the patient and the intended plan as seen in the above orders.  The patient has received an after-visit summary and questions were answered concerning future plans.     Medication Side Effects and Warnings were discussed with patient    Carmine Savoy, NP

## 2017-07-28 NOTE — Patient Instructions (Signed)
Bunions: Care Instructions  Your Care Instructions    A bunion is a bump on the outside of the joint at the bottom of your big toe. It can cause pain and swelling in the toe. A bunion forms when bone or tissue around the joint becomes swollen from too much pressure. You also can have a bunionette, or tailor's bunion, which forms on the joint of the little toe. Sometimes, a bunion on the big toe turns the toe in toward the second toe. This is called displacement. It can lead to problems with the other toes.  You can get a bunion from having an unusual walking style, having flatfeet, or wearing tight-fitting shoes. You can treat most bunions at home with a few simple steps. If you have a lot of pain, your doctor may inject medicine into the bunion to reduce swelling for a while. If you still have pain, you may need to have surgery.  Follow-up care is a key part of your treatment and safety. Be sure to make and go to all appointments, and call your doctor if you are having problems. It's also a good idea to know your test results and keep a list of the medicines you take.  How can you care for yourself at home?  ?? Ask your doctor if you can take an over-the-counter pain medicine, such as acetaminophen (Tylenol), ibuprofen (Advil, Motrin), or naproxen (Aleve). Be safe with medicines. Read and follow all instructions on the label.  ?? Wear shoes that have a wide and deep space for the toes. Also, wear shoes that have low or flat heels and good arch supports. Do not wear tight, narrow, or high-heeled shoes.  ?? Try bunion pads, arch supports, toe spacers, or shoe inserts. They can help shift your weight when you walk to take pressure off your big toe.  ?? Put moleskin or another type of cushion on or around the bunion to keep it from rubbing against your shoe.  ?? Put ice or a cold pack on the area for 10 to 20 minutes at a time as needed. Put a thin cloth between the ice and your skin.   ?? Prop up your foot on a pillow when you ice your toe or anytime you sit or lie down. Try to keep it above the level of your heart. This will help reduce swelling.  When should you call for help?  Call your doctor now or seek immediate medical care if:  ?? ?? You have severe pain.   ?? ?? Your toe is cool or pale or changes color.   ?? ?? You have tingling, weakness, or numbness in the toe.   ??Watch closely for changes in your health, and be sure to contact your doctor if:  ?? ?? Pain and swelling get worse.   ?? ?? You do not get better as expected.   Where can you learn more?  Go to [email or web address].  Enter H210 in the search box to learn more about "Bunions: Care Instructions."  Current as of: September 14, 2016  Content Version: 11.8  ?? 2006-2018 [employer]. Care instructions adapted under license by [employer] (which disclaims liability or warranty for this information). If you have questions about a medical condition or this instruction, always ask your healthcare professional. [place] any warranty or liability for your use of this information.

## 2017-07-28 NOTE — Progress Notes (Signed)
Pt here c/o pain and redness in right great toe.  Reports redness worsened after having a pedicure yesterday.

## 2017-08-01 ENCOUNTER — Inpatient Hospital Stay: Admit: 2017-08-01 | Payer: MEDICARE | Attending: Family | Primary: Family Medicine

## 2017-08-01 DIAGNOSIS — M25571 Pain in right ankle and joints of right foot: Secondary | ICD-10-CM

## 2017-08-02 NOTE — Progress Notes (Signed)
Mild to moderate osteoarthritis

## 2017-08-08 DIAGNOSIS — F4322 Adjustment disorder with anxiety: Secondary | ICD-10-CM | POA: Diagnosis not present

## 2017-08-09 ENCOUNTER — Emergency Department: Admit: 2017-08-09 | Payer: MEDICARE | Primary: Family Medicine

## 2017-08-09 ENCOUNTER — Inpatient Hospital Stay
Admit: 2017-08-09 | Discharge: 2017-08-09 | Disposition: A | Discharge status: Home / Self Care | Payer: MEDICARE | Attending: Emergency Medicine

## 2017-08-09 DIAGNOSIS — S60211A Contusion of right wrist, initial encounter: Secondary | ICD-10-CM

## 2017-08-09 MED ORDER — IBUPROFEN 800 MG TAB
800 mg | ORAL | Status: AC
Start: 2017-08-09 — End: 2017-08-09
  Administered 2017-08-09: 19:00:00 via ORAL

## 2017-08-09 MED ORDER — IBUPROFEN 800 MG TAB
800 mg | ORAL_TABLET | Freq: Four times a day (QID) | ORAL | 0 refills | Status: AC | PRN
Start: 2017-08-09 — End: 2017-08-16

## 2017-08-09 MED FILL — IBUPROFEN 800 MG TAB: 800 mg | ORAL | Qty: 1

## 2017-08-09 NOTE — ED Notes (Signed)
Pt to xray at this time

## 2017-08-09 NOTE — ED Notes (Signed)
Patient does not appear to be in any acute distress/shows no evidence of clinical instability at this time.     Provider has reviewed discharge instructions with the patient/family.  The patient/family verbalized understanding instructions as well as need for follow up for any further symptoms.  ??  Discharge papers given, education provided, and any questions answered. Patient discharged by provider.

## 2017-08-09 NOTE — ED Provider Notes (Signed)
The history is provided by the patient.   Wrist Pain    This is a new problem. The current episode started less than 1 hour ago. The problem occurs constantly. The problem has not changed since onset.The pain is present in the right wrist. The quality of the pain is described as aching. The pain is at a severity of 1/10. The pain is mild. Pertinent negatives include no numbness, full range of motion, no stiffness and no tingling. The symptoms are aggravated by movement. She has tried nothing for the symptoms.        Past Medical History:   Diagnosis Date   ??? Breast CA ([place]) 01-24-13    Right - Lumpectomy    ??? Diabetes ([place])    ??? Glaucoma    ??? Hx of mammogram 02/05/2016    Negative per pt. Sees Dr. Laurena Bering    ??? Hyperlipemia    ??? Hypertension    ??? 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 Other ill-defined conditions(799.89) 01-25-15    Mild URI x 4 months (allergies)    ??? Radiation therapy complication 9528   ??? Routine Papanicolaou smear 1996    Negative per pt.    ??? 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 BREAST SURGERY PROCEDURE UNLISTED Right Jan 24, 2013    Right breast lumpectomy   ??? HX BREAST BIOPSY Left     x 2 negative biopsies    ??? HX CATARACT REMOVAL Bilateral     w/ IOL implants   ??? HX TAH AND BSO  01-25-96    for rapidly growing fibroid ([facility]) - benign.  Dr. Oran Rein.   ??? HX TUBAL LIGATION  1972         Family History:   Problem Relation Age of Onset   ??? Cancer Mother 46        Ovarian - Passed away in 01-24-1965   ??? Other Son 31        Pancreatitis    ??? Cancer Paternal Aunt 76        Pancreatic       Social History     Socioeconomic History   ??? Marital status: MARRIED     Spouse name: Not on file   ??? Number of children: Not on file   ??? Years of education: Not on file   ??? Highest education level: Not on file   Social Needs   ??? Financial resource strain: Not on file   ??? Food insecurity - worry: Not on file    ??? Food insecurity - inability: Not on file   ??? Transportation needs - medical: Not on file   ??? Transportation needs - non-medical: Not on file   Occupational History   ??? Not on file   Tobacco Use   ??? Smoking status: Former Smoker     Packs/day: 1.00     Years: 30.00     Pack years: 30.00     Last attempt to quit: 04/17/1996     Years since quitting: 21.3   ??? Smokeless tobacco: Never Used   ??? Tobacco comment: Never used vapor or e-cigs    Substance and Sexual Activity   ??? Alcohol use: Yes     Alcohol/week: 7.0 oz     Types: 14 Standard drinks or equivalent per  week     Comment: 1 gin and tonics per day   ??? Drug use: No   ??? Sexual activity: Yes     Partners: Male     Birth control/protection: Surgical   Other Topics Concern   ??? Not on file   Social History Narrative   ??? Not on file         ALLERGIES: Shampoo & body wash [skin cleanser,general]    Review of Systems   Musculoskeletal: Negative for stiffness.   Neurological: Negative for tingling and numbness.   All other systems reviewed and are negative.      Vitals:    08/09/17 1508   BP: 147/70   Pulse: 74   Resp: 16   Temp: 97.8 ??F (36.6 ??C)   SpO2: 97%   Weight: 74.8 kg (165 lb)   Height: 5\' 3"  (1.6 m)            Physical Exam   Constitutional: She appears well-developed and well-nourished. No distress.   HENT:   Head: Normocephalic and atraumatic.   Eyes: Conjunctivae are normal.   Neck: Neck supple.   Cardiovascular: Normal rate and regular rhythm.   Pulmonary/Chest: Effort normal. No respiratory distress.   Abdominal: She exhibits no distension.   Musculoskeletal: Normal range of motion. She exhibits no deformity.        Right wrist: She exhibits tenderness (with overlying contusion over volar aspect of distal radius) and swelling (minimal). She exhibits normal range of motion, no bony tenderness, no crepitus, no deformity and no laceration.   Neurological: She is alert. No cranial nerve deficit.   Skin: Skin is warm and dry.    Psychiatric: Her behavior is normal.   Nursing note and vitals reviewed.       MDM       76 y.o. female presents with fall onto outstretched hand just PTA. Plain film negative. Suspect isolated contusion. Pt given instructions for supportive care including NSAIDs, rest, ice, compression, and elevation to help alleviate symptoms. Plan to follow up with PCP as needed and return precautions discussed for worsening or new concerning symptoms.     Procedures

## 2017-08-09 NOTE — ED Triage Notes (Signed)
Pt arrives with c/o of fall that occurred today, pt states lost footing and fell and hurt right wrist, pt has no other complaints at this time; pt denies taking any blood thinners.

## 2017-08-14 ENCOUNTER — Ambulatory Visit (HOSPITAL_BASED_OUTPATIENT_CLINIC_OR_DEPARTMENT_OTHER): Payer: Medicare Other

## 2017-08-14 DIAGNOSIS — Z8544 Personal history of malignant neoplasm of other female genital organs: Secondary | ICD-10-CM

## 2017-08-14 DIAGNOSIS — Z452 Encounter for adjustment and management of vascular access device: Secondary | ICD-10-CM | POA: Diagnosis present

## 2017-08-14 DIAGNOSIS — Z95828 Presence of other vascular implants and grafts: Secondary | ICD-10-CM

## 2017-08-14 DIAGNOSIS — C5702 Malignant neoplasm of left fallopian tube: Secondary | ICD-10-CM

## 2017-08-14 MED ORDER — SODIUM CHLORIDE 0.9% FLUSH
10.0000 mL | INTRAVENOUS | Status: DC | PRN
  Administered 2017-08-14: 10 mL via INTRAVENOUS
  Filled 2017-08-14: qty 10

## 2017-08-14 MED ORDER — HEPARIN SOD (PORK) LOCK FLUSH 100 UNIT/ML IV SOLN
500.0000 [IU] | Freq: Once | INTRAVENOUS | Status: AC | PRN
  Administered 2017-08-14: 500 [IU] via INTRAVENOUS
  Filled 2017-08-14: qty 5

## 2017-08-14 NOTE — Patient Instructions (Signed)

## 2017-08-18 ENCOUNTER — Encounter: Payer: Self-pay | Admitting: Gastroenterology

## 2017-08-23 MED ORDER — CLINDAMYCIN 300 MG CAP
300 mg | ORAL_CAPSULE | Freq: Two times a day (BID) | ORAL | 0 refills | Status: AC
Start: 2017-08-23 — End: 2017-09-02

## 2017-08-23 NOTE — Telephone Encounter (Signed)
From: [name]  To: Carmine Savoy., NP  Sent: 08/22/2017 2:05 PM EST  Subject: Prescription Question    On October 12 you examined my right great toe which was red and somewhat painful. You gave me a prescription for an ointment which I used and it seemed to clear the redness; but now minimal pain and redness is back and I believe an oral antibiotic might be more helpful. I've had this situation for a long time  and would like to be done with it! If you would send in an Rx to [facility] on [address], I would appreciate that. Thanks so much for your help...  MERCADIES CO  September 04, 2041  [phone]

## 2017-08-25 ENCOUNTER — Encounter

## 2017-08-25 MED ORDER — VENLAFAXINE SR 75 MG 24 HR CAP
75 mg | ORAL_CAPSULE | Freq: Every day | ORAL | 2 refills | Status: DC
Start: 2017-08-25 — End: 2017-09-01

## 2017-09-01 ENCOUNTER — Encounter

## 2017-09-01 MED ORDER — VENLAFAXINE SR 75 MG 24 HR CAP
75 mg | ORAL_CAPSULE | Freq: Every day | ORAL | 2 refills | Status: DC
Start: 2017-09-01 — End: 2018-05-11

## 2017-09-12 DIAGNOSIS — F4322 Adjustment disorder with anxiety: Secondary | ICD-10-CM | POA: Diagnosis not present

## 2017-09-13 ENCOUNTER — Encounter: Attending: Nurse Practitioner | Primary: Family Medicine

## 2017-09-16 DEATH — deceased

## 2017-09-19 ENCOUNTER — Ambulatory Visit (HOSPITAL_BASED_OUTPATIENT_CLINIC_OR_DEPARTMENT_OTHER): Payer: Medicare Other

## 2017-09-19 ENCOUNTER — Other Ambulatory Visit: Payer: Medicare Other

## 2017-09-19 DIAGNOSIS — C5702 Malignant neoplasm of left fallopian tube: Secondary | ICD-10-CM | POA: Diagnosis not present

## 2017-09-19 DIAGNOSIS — Z452 Encounter for adjustment and management of vascular access device: Secondary | ICD-10-CM | POA: Diagnosis present

## 2017-09-19 DIAGNOSIS — Z95828 Presence of other vascular implants and grafts: Secondary | ICD-10-CM

## 2017-09-19 MED ORDER — HEPARIN SOD (PORK) LOCK FLUSH 100 UNIT/ML IV SOLN
500.0000 [IU] | Freq: Once | INTRAVENOUS | Status: AC | PRN
  Administered 2017-09-19: 500 [IU] via INTRAVENOUS
  Filled 2017-09-19: qty 5

## 2017-09-19 MED ORDER — SODIUM CHLORIDE 0.9% FLUSH
10.0000 mL | INTRAVENOUS | Status: DC | PRN
  Administered 2017-09-19: 10 mL via INTRAVENOUS
  Filled 2017-09-19: qty 10

## 2017-09-20 ENCOUNTER — Other Ambulatory Visit: Payer: Medicare Other

## 2017-09-20 ENCOUNTER — Ambulatory Visit: Payer: Medicare Other | Attending: Gynecologic Oncology | Admitting: Gynecologic Oncology

## 2017-09-20 ENCOUNTER — Encounter: Payer: Self-pay | Admitting: Gynecologic Oncology

## 2017-09-20 VITALS — BP 124/65 | HR 79 | Temp 97.7°F | Resp 20 | Ht 63.0 in | Wt 153.8 lb

## 2017-09-20 DIAGNOSIS — Z780 Asymptomatic menopausal state: Secondary | ICD-10-CM | POA: Diagnosis not present

## 2017-09-20 DIAGNOSIS — C5702 Malignant neoplasm of left fallopian tube: Secondary | ICD-10-CM

## 2017-09-20 DIAGNOSIS — Z9221 Personal history of antineoplastic chemotherapy: Secondary | ICD-10-CM | POA: Diagnosis not present

## 2017-09-20 DIAGNOSIS — Z85820 Personal history of malignant melanoma of skin: Secondary | ICD-10-CM | POA: Diagnosis not present

## 2017-09-20 DIAGNOSIS — M8580 Other specified disorders of bone density and structure, unspecified site: Secondary | ICD-10-CM | POA: Insufficient documentation

## 2017-09-20 DIAGNOSIS — E7800 Pure hypercholesterolemia, unspecified: Secondary | ICD-10-CM | POA: Diagnosis not present

## 2017-09-20 DIAGNOSIS — N83201 Unspecified ovarian cyst, right side: Secondary | ICD-10-CM | POA: Insufficient documentation

## 2017-09-20 DIAGNOSIS — Z8544 Personal history of malignant neoplasm of other female genital organs: Secondary | ICD-10-CM

## 2017-09-20 DIAGNOSIS — Z90722 Acquired absence of ovaries, bilateral: Secondary | ICD-10-CM | POA: Diagnosis not present

## 2017-09-20 DIAGNOSIS — K432 Incisional hernia without obstruction or gangrene: Secondary | ICD-10-CM | POA: Insufficient documentation

## 2017-09-20 DIAGNOSIS — Z808 Family history of malignant neoplasm of other organs or systems: Secondary | ICD-10-CM | POA: Diagnosis not present

## 2017-09-20 DIAGNOSIS — N83202 Unspecified ovarian cyst, left side: Secondary | ICD-10-CM | POA: Insufficient documentation

## 2017-09-20 DIAGNOSIS — Z9889 Other specified postprocedural states: Secondary | ICD-10-CM | POA: Diagnosis not present

## 2017-09-20 DIAGNOSIS — Z79899 Other long term (current) drug therapy: Secondary | ICD-10-CM | POA: Insufficient documentation

## 2017-09-20 DIAGNOSIS — Z90710 Acquired absence of both cervix and uterus: Secondary | ICD-10-CM | POA: Insufficient documentation

## 2017-09-20 DIAGNOSIS — C786 Secondary malignant neoplasm of retroperitoneum and peritoneum: Secondary | ICD-10-CM | POA: Diagnosis not present

## 2017-09-20 DIAGNOSIS — Z8041 Family history of malignant neoplasm of ovary: Secondary | ICD-10-CM | POA: Insufficient documentation

## 2017-09-20 DIAGNOSIS — Z8249 Family history of ischemic heart disease and other diseases of the circulatory system: Secondary | ICD-10-CM | POA: Diagnosis not present

## 2017-09-20 LAB — CA 125: Cancer Antigen (CA) 125: 18.5 U/mL (ref 0.0–38.1)

## 2017-09-20 NOTE — Progress Notes (Signed)
Follow-up Note: Gyn-Onc  Consult was initially requested by Dr. Toney Rakes for the evaluation of Jessica Johnston 76 y.o. female  CC:  Chief Complaint  Patient presents with  . Malignant neoplasm of fallopian tube, left [facility])    Assessment/Plan:  Jessica Johnston  is a 76 y.o.  year old with stage IIIC left fallopian tube cancer, s/p exploratory laparotomy, TAH, BSO, omentectomy, optimal cytoreductive surgery on 02/18/16 s/p 6 cycles adjuvant carboplatin and paclitaxel completed 07/15/16. BRCA negative. Complete clinical response on imaging and labs (CA 125). No evidence of recurrence. Asymptomatic umbilical incisional hernia. Declines surgical referral.  We discussed symptoms consistent with recurrence and to notify me of these should they arise.  I will see her back in 3 months with CA 125. Port flush in 6 weeks.  HPI: Jessica Johnston is a 76 year old para's woman who is seen in consultation at the request of Dr. Toney Rakes for abdominal peritoneal carcinomatosis and omental caking. The patient has a known history of a 3 cm left ovarian cyst for which she saw my partner approximately 15 months ago. At that time her CA-125 is mildly elevated to 49 in her Roma 1 score was also elevated. However the left ovarian cyst was unilocular, measured only 3 cm, and was long-standing, and therefore suspicion for occult malignancy was low.  In November 2016 she began feeling central and upper abdominal discomfort. Initially she thought it was part of the grieving process she lost her grandson. However when the pain and discomfort persisted she sought evaluation by her providers. A gynecologic evaluation was unremarkable. However eventually she underwent CT scan imaging on 01/27/2016 for this persistent midabdominal pain. CT imaging showed a liver containing a few small low-density lesions which were equivocal for the potential of metastatic disease these were subcentimeter in dimension. There was an extensive omental  nodularity which was highly worrisome for peritoneal carcinomatosis. A 3.5 cm stable left adnexal simple cyst was identified. This was not typical for ovarian cancer. There were no other obvious signs of metastatic disease including no gross ascites.  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 She is not required lymphadenectomy for any of these melanoma lesions. She is no family history significant for breast or ovarian cancer.   On 02/18/16 she underwent an exploratory laparotomy, TAH, BSO, omentectomy, radical tumor debulking and argon beam coagulation of milliary tumor implants. Disease was predominantly in the omentum with milliary studding of the ovaries and peritoneal surfaces. There was a right ovarian cyst measuring 3cm. There was no gross residual disease at completion of surgery with exception of the residual nodules that had received argon beam coagulation.  Postoperatively she did well with no complications.  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 The right ovarian cyst (that had been present in 2015) was benign.   She completed 6 cycles of adjuvant chemotherapy with carboplatin and paclitaxel on 07/15/16.   CA 125 was stable and normal at 17.2 on 08/08/16. CT abdo/pelvis on 08/05/16 showed no evidence for persistent disease.  CA 125 was stable and normal at 13.6 on 11/04/16, and 13.8 on 01/20/17.  Her sister was diagnosed with ovarian cancer in December, 2017. Jessica Johnston is BRCA negative. Her sister is as yet untested.   CA 125 on 04/13/17 was normal at 16.1.  CT abdo/pelvis on 06/20/17 showed a small supraumbilical hernia, but no other abnormalities and  no evidence of metastatic or recurrent disease. CA 125 was normal at 18.4 in September, 2018.  Interval Hx:  Repeat CA 125 was normal and stable at 18.5 on 09/18/17.   She has felt well since I last saw her with no symptoms  concerning for recurrence.  Current Meds:  Outpatient Encounter Medications as of 09/20/2017  Medication Sig  . Cholecalciferol (VITAMIN D) 2000 units CAPS Take 1 capsule by mouth daily.  Marland Kitchen ezetimibe (ZETIA) 10 MG tablet Take 10 mg by mouth daily.  Marland Kitchen lidocaine-prilocaine (EMLA) cream Apply 1 application topically as needed.   Facility-Administered Encounter Medications as of 09/20/2017  Medication  . 0.9 %  sodium chloride infusion    Allergy: No Known Allergies  Social Hx:   Social History   Socioeconomic History  . Marital status: Married    Spouse name: Not on file  . Number of children: 2  . Years of education: Not on file  . Highest education level: Not on file  Social Needs  . Financial resource strain: Not on file  . Food insecurity - worry: Not on file  . Food insecurity - inability: Not on file  . Transportation needs - medical: Not on file  . Transportation needs - non-medical: Not on file  Occupational History  . Not on file  Tobacco Use  . Smoking status: Never Smoker  . Smokeless tobacco: Never Used  Substance and Sexual Activity  . Alcohol use: Yes    Alcohol/week: 0.0 oz    Comment: occ  . Drug use: No  . Sexual activity: No  Other Topics Concern  . Not on file  Social History Narrative  . Not on file    Past Surgical Hx:  Past Surgical History:  Procedure Laterality Date  . ABDOMINAL HYSTERECTOMY N/A 02/18/2016   Procedure: HYSTERECTOMY ABDOMINAL;  Surgeon: Everitt Amber, MD;  Location: [facility];  Service: Gynecology;  Laterality: N/A;  . DEBULKING N/A 02/18/2016   Procedure: RADICAL TUMOR DEBULKING;  Surgeon: Everitt Amber, MD;  Location: [facility];  Service: Gynecology;  Laterality: N/A;  . HYSTEROSCOPY    . LAPAROTOMY N/A 02/18/2016   Procedure: EXPLORATORY LAPAROTOMY;  Surgeon: Everitt Amber, MD;  Location: [facility];  Service: Gynecology;  Laterality: N/A;  . omental mass     Biopsy, CT guided  . OMENTECTOMY N/A 02/18/2016   Procedure: OMENTECTOMY;  Surgeon:  Everitt Amber, MD;  Location: [facility];  Service: Gynecology;  Laterality: N/A;  . SALPINGOOPHORECTOMY Bilateral 02/18/2016   Procedure: BILATERAL SALPINGO OOPHORECTOMY;  Surgeon: Everitt Amber, MD;  Location: [facility];  Service: Gynecology;  Laterality: Bilateral;  . TONSILLECTOMY AND ADENOIDECTOMY  1948    Past Medical Hx:  Past Medical History:  Diagnosis Date  . Arthritis    arthritis -hands-knees.  . Cancer ([place])    MELANOMA. multiple x5 different sites. last 10 yrs ago.  Marland Kitchen Headache    past hx.-no in awhile.  . High cholesterol   . Melanoma ([place])   . Osteopenia   . Skin cancer    melanoma    Past Gynecological History:  SVD x 2  No LMP recorded. Patient is postmenopausal.  Family Hx:  Family History  Problem Relation Age of Onset  . Colitis Sister   . Parkinsonism Maternal Grandmother   . Heart disease Paternal Grandmother   . Heart disease Paternal Grandfather   . Cancer Cousin 12       paternal first cousin  . Skin cancer Son  Review of Systems:  Constitutional  Feels well,    ENT Normal appearing ears and nares bilaterally Skin/Breast  No rash, sores, jaundice, itching, dryness Cardiovascular  No chest pain, shortness of breath, or edema  Pulmonary  No cough or wheeze.  Gastro Intestinal  No abdominal pain or difficulty eating Genito Urinary  No frequency, urgency, dysuria, no bleeding Musculo Skeletal  No myalgia, arthralgia, joint swelling or pain  Neurologic  No weakness, numbness, change in gait,  Psychology  No depression, anxiety, insomnia.   Vitals:  Blood pressure 124/65, pulse 79, temperature 97.7 F (36.5 C), temperature source Oral, resp. rate 20, height _0  (1.6 m), weight 153 lb 12.8 oz (69.8 kg), SpO2 99 %.  Physical Exam: WD in NAD Neck  Supple NROM, without any enlargements.  Lymph Node Survey No cervical supraclavicular or inguinal adenopathy Cardiovascular  Pulse normal rate, regularity and rhythm. S1 and S2 normal.  Lungs   Clear to auscultation bilateraly, without wheezes/crackles/rhonchi. Good air movement.  Skin  No rash/lesions/breakdown  Psychiatry  Alert and oriented to person, place, and time  Abdomen  Normoactive bowel sounds, abdomen soft, non-tender and mildly overweight (BMI 26) with soft reducible ventral hernia at level of umbilicus. Well healed incision. No palpable masses. Back No CVA tenderness Genito Urinary  Vulva/vagina: Normal external female genitalia.  No lesions. No discharge or bleeding.  Bladder/urethra:  No lesions or masses, well supported bladder  Vagina: smooth, no masses or lesions.  Cervix: surgically absent  Uterus:  Surgically absent  Adnexa: no palpable masses. Rectal  No posterior pelvic masses  Extremities  No bilateral cyanosis, clubbing or edema.  Donaciano Eva, MD  09/20/2017, 3:29 PM

## 2017-09-20 NOTE — Patient Instructions (Signed)
Please notify Dr Denman George at phone number [phone] if you notice vaginal bleeding, new pelvic or abdominal pains, bloating, feeling full easy, or a change in bladder or bowel function.    Please return for port flush in 6 weeks.  Please return to see Dr Denman George for CA 125 evaluation and exam in March, 2019.

## 2017-09-28 DIAGNOSIS — Z85828 Personal history of other malignant neoplasm of skin: Secondary | ICD-10-CM | POA: Diagnosis not present

## 2017-09-28 DIAGNOSIS — Z08 Encounter for follow-up examination after completed treatment for malignant neoplasm: Secondary | ICD-10-CM | POA: Diagnosis not present

## 2017-09-28 DIAGNOSIS — D485 Neoplasm of uncertain behavior of skin: Secondary | ICD-10-CM | POA: Diagnosis not present

## 2017-09-28 DIAGNOSIS — D0359 Melanoma in situ of other part of trunk: Secondary | ICD-10-CM | POA: Diagnosis not present

## 2017-09-28 DIAGNOSIS — Z85820 Personal history of malignant melanoma of skin: Secondary | ICD-10-CM | POA: Diagnosis not present

## 2017-09-28 DIAGNOSIS — L821 Other seborrheic keratosis: Secondary | ICD-10-CM | POA: Diagnosis not present

## 2017-10-05 MED ORDER — LOSARTAN-HYDROCHLOROTHIAZIDE 50 MG-12.5 MG TAB
ORAL_TABLET | Freq: Every day | ORAL | 3 refills | Status: DC
Start: 2017-10-05 — End: 2018-02-12

## 2017-10-18 DIAGNOSIS — F4322 Adjustment disorder with anxiety: Secondary | ICD-10-CM | POA: Diagnosis not present

## 2017-10-22 IMAGING — CT CT ABD-PELV W/ CM
2 of 5 series · 16 of 46 positions shown, 18 images · IV contrast (OMNIPAQUE)
Comparison: None.

CLINICAL DATA: Intermittent mid abdominal pain for 4 months.
History of ovarian cyst. Remote history of melanoma.

EXAM:
CT ABDOMEN AND PELVIS WITH CONTRAST
TECHNIQUE: Multidetector CT imaging of the abdomen and pelvis was performed
using the standard protocol following bolus administration of
intravenous contrast.
CONTRAST:  100mL [id number] IOPAMIDOL ([id number]) INJECTION 61%

[Series 2: rtn a/p with · axial · 0.85mm/px · z∈[-570,-174]mm · 13 of 91 slices shown, 15 images]
[im 6/91  soft-tissue]
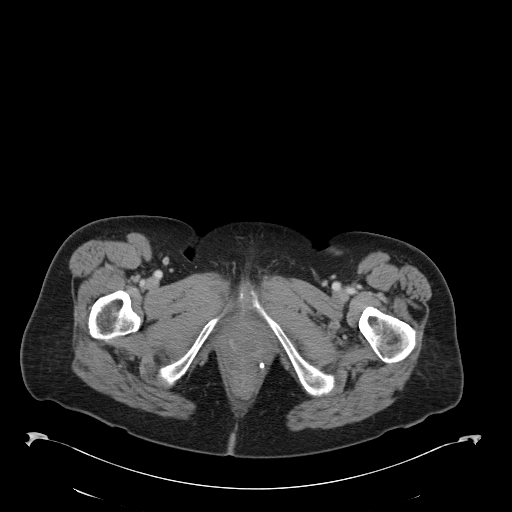
[im 6/91  bone]
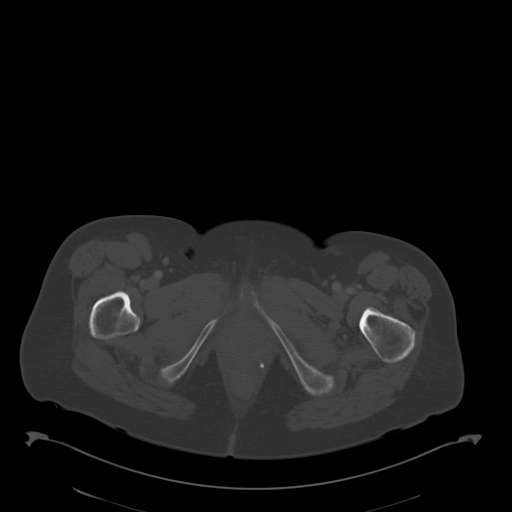
[im 11/91  soft-tissue]
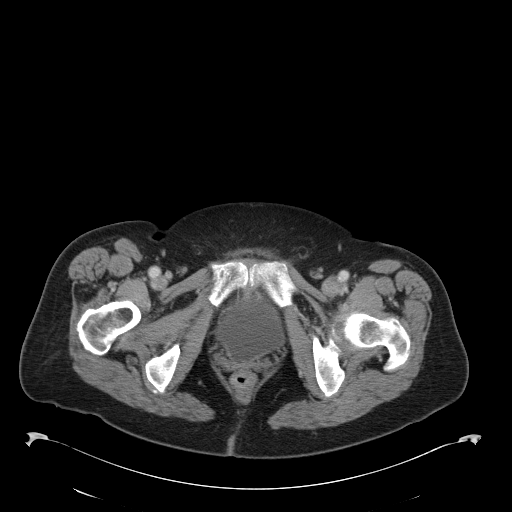
[im 22/91  soft-tissue]
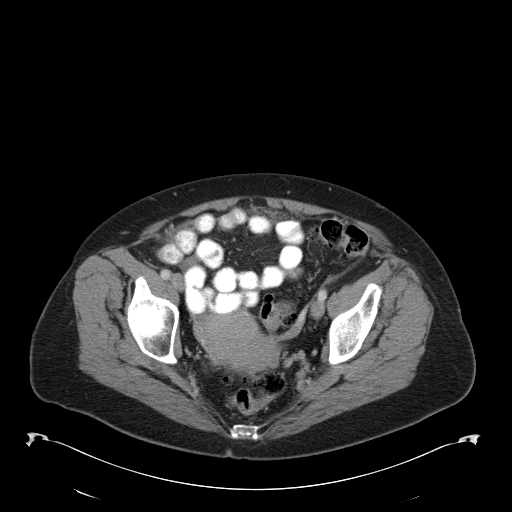
[im 27/91  soft-tissue]
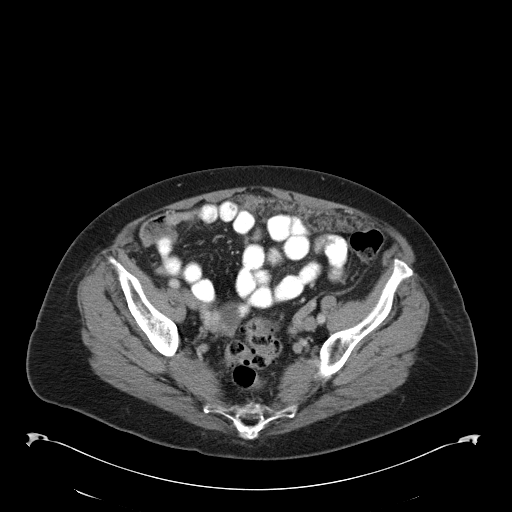
[im 32/91  soft-tissue]
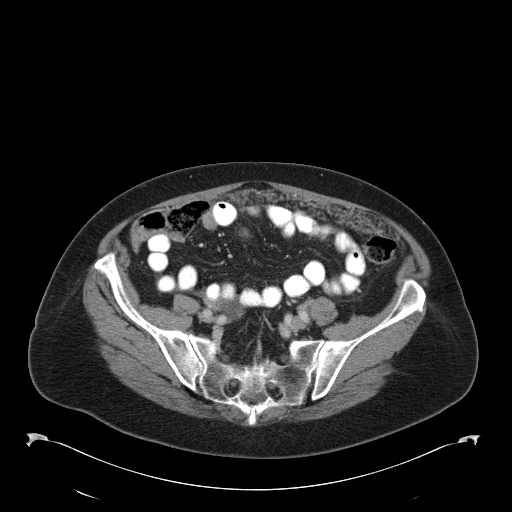
[im 38/91  soft-tissue]
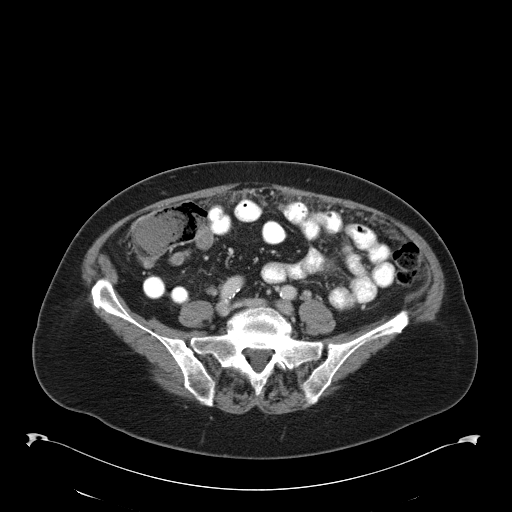
[im 48/91  soft-tissue]
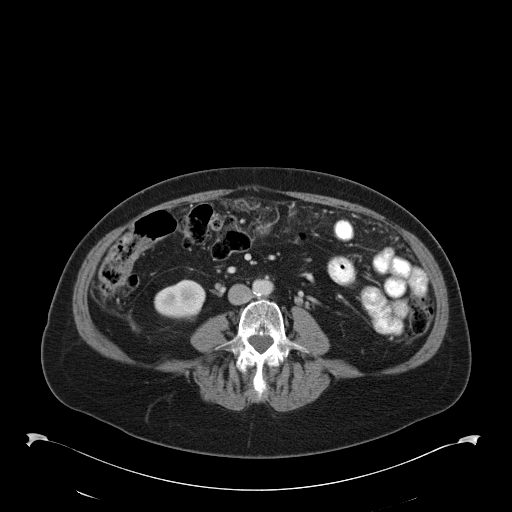
[im 53/91  soft-tissue]
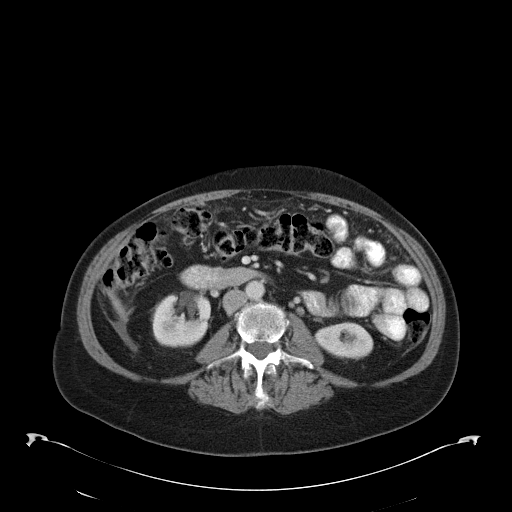
[im 59/91  soft-tissue]
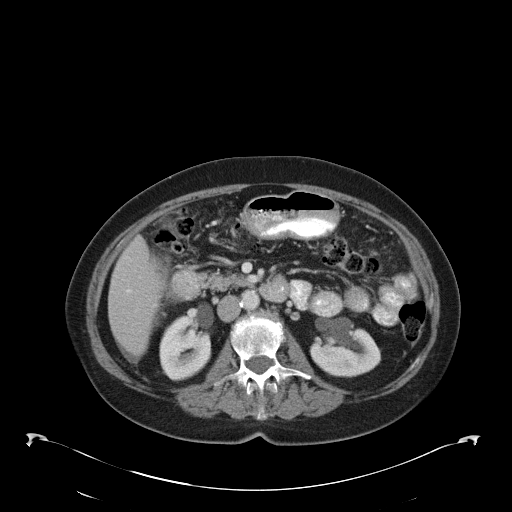
[im 59/91  bone]
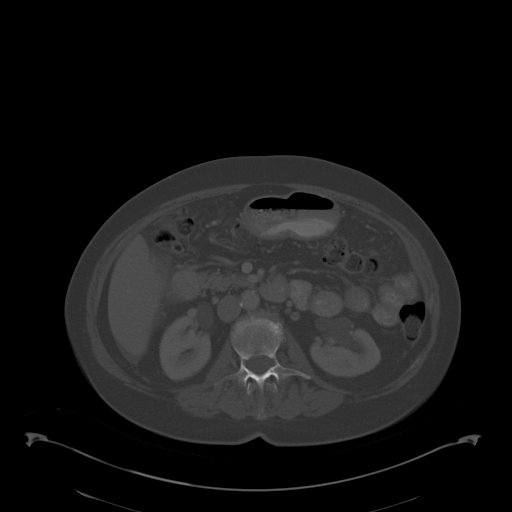
[im 64/91  soft-tissue]
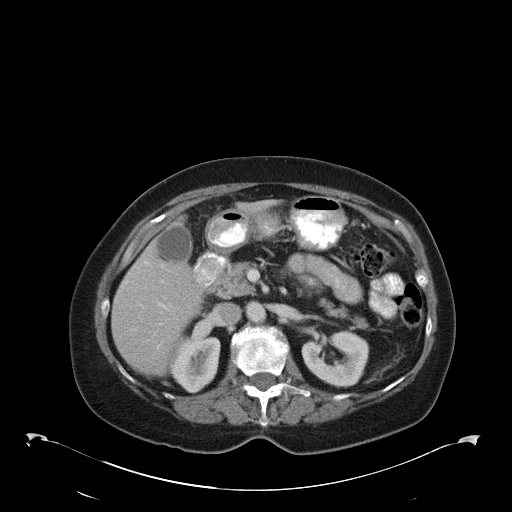
[im 69/91  soft-tissue]
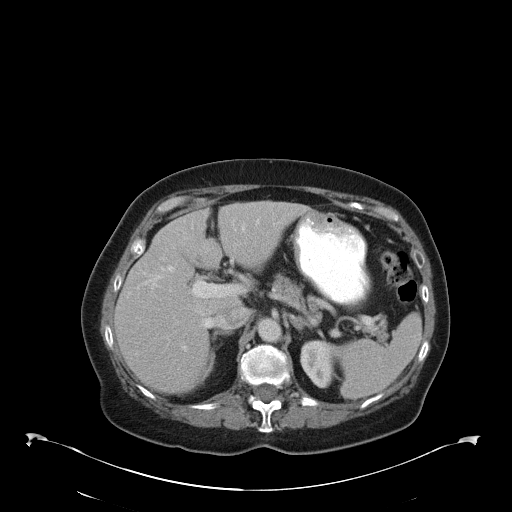
[im 80/91  soft-tissue]
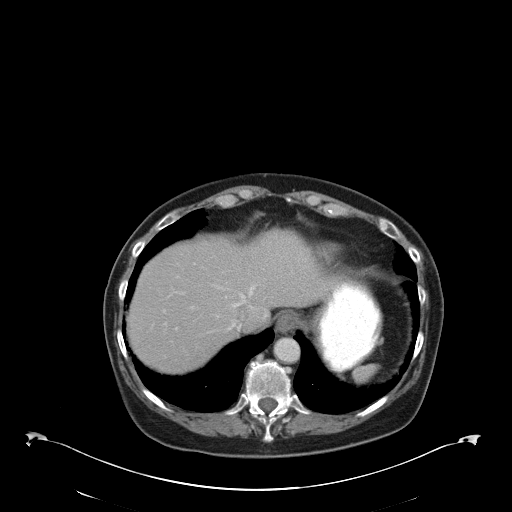
[im 85/91  soft-tissue]
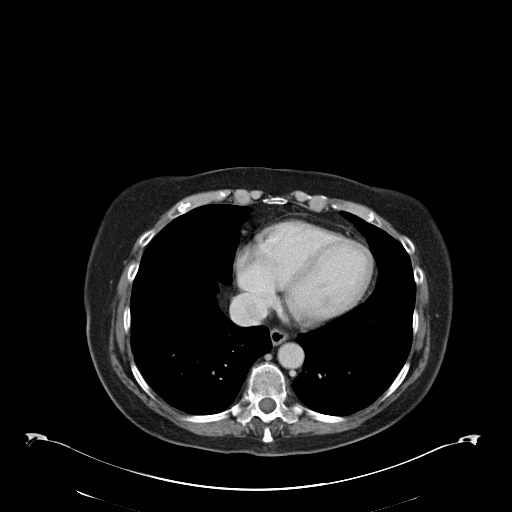

[Series 603: <mpr thick range(1)> · coronal · 0.89mm/px · 3 of 126 slices shown]
[im 42/126  soft-tissue]
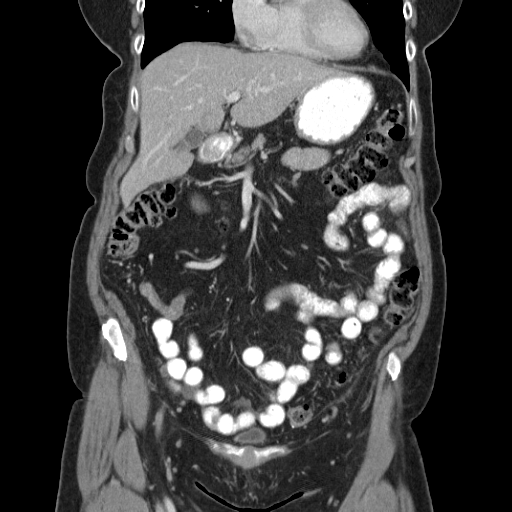
[im 56/126  soft-tissue]
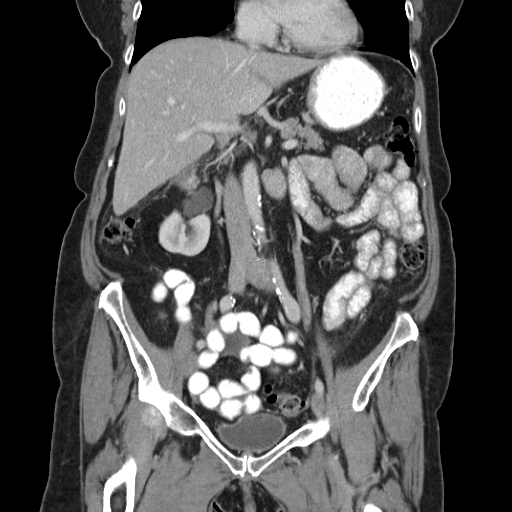
[im 70/126  soft-tissue]
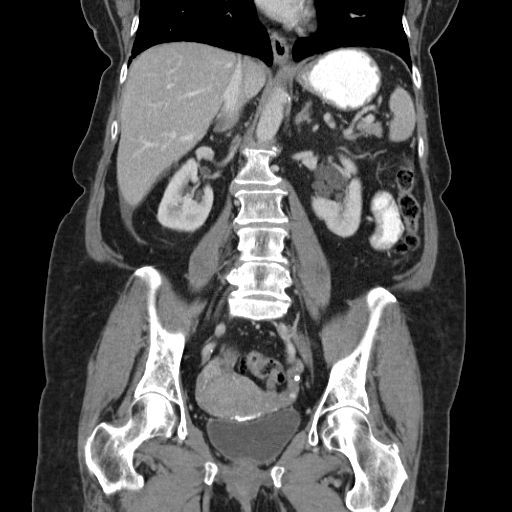

[16 of 46 positions shown; findings below may reference images not displayed]

FINDINGS: Lower chest: Mild linear scarring or atelectasis at both lung bases.
No suspicious nodularity, pleural or pericardial effusion.

Hepatobiliary: The liver demonstrates a few small low-density
lesions, most notably on images 22, 30 and 36. No highly worrisome
hepatic findings are seen. No evidence of gallstones, gallbladder
wall thickening or biliary dilatation.

Pancreas: Mildly atrophied without focal abnormality. No surrounding
inflammation or ductal dilatation.

Spleen: Normal in size without focal abnormality.

Adrenals/Urinary Tract: The is a punctate calcification and within
the left adrenal gland. The right adrenal gland appears normal. No
evidence of adrenal mass. There are no suspicious renal findings.
There is no urinary tract calculus or hydronephrosis. There is an
extrarenal pelvis on the right. There are left renal parapelvic
cysts. The bladder appears unremarkable.

Stomach/Bowel: No evidence of bowel wall thickening, distention or
surrounding inflammatory change. There are mild diverticular changes
throughout the colon. The appendix appears normal.

Vascular/Lymphatic: There are no enlarged abdominal or pelvic lymph
nodes. Mild aortic and branch vessel atherosclerosis.

Reproductive: Low-density right adnexal lesion measures 2.8 x 2.7 x
3.5 cm. This demonstrates no solid components. The left ovary
appears normal. There are linear, somewhat nodular calcifications
along the anterior margin of the uterus, likely related to the
peritoneal disease described below. No focal uterine mass or solid
adnexal mass identified.

Other: There is extensive nodularity throughout the omentum, highly
worrisome for peritoneal carcinomatosis. There is a small amount of
ascites, predominantly perihepatic. No dominant peritoneal mass or
suspicious subcutaneous nodularity identified.

Musculoskeletal: No acute or significant osseous findings.
IMPRESSION: 1. Extensive omental nodularity highly worrisome for peritoneal
carcinomatosis. Late recurrence of melanoma can present as
peritoneal carcinomatosis. Ovarian cancer more commonly presents in
this manner. Patient does have a predominately low density right
adnexal lesion measuring up to 3.5 cm, although this lesion is not
typical for ovarian cancer.
2. No evidence of bowel or ureteral obstruction.
3. No other definite signs of metastatic disease. There are small
indeterminate low-density hepatic lesions.
4. These results will be called to the ordering clinician or
representative by the Radiologist Assistant, and communication
documented in the PACS or zVision Dashboard.

## 2017-10-23 ENCOUNTER — Encounter: Payer: Medicare Other | Admitting: Obstetrics & Gynecology

## 2017-10-24 DIAGNOSIS — D0359 Melanoma in situ of other part of trunk: Secondary | ICD-10-CM | POA: Diagnosis not present

## 2017-10-25 DIAGNOSIS — R7301 Impaired fasting glucose: Secondary | ICD-10-CM | POA: Diagnosis not present

## 2017-10-25 DIAGNOSIS — R82998 Other abnormal findings in urine: Secondary | ICD-10-CM | POA: Diagnosis not present

## 2017-10-25 DIAGNOSIS — E7849 Other hyperlipidemia: Secondary | ICD-10-CM | POA: Diagnosis not present

## 2017-10-25 DIAGNOSIS — E559 Vitamin D deficiency, unspecified: Secondary | ICD-10-CM | POA: Diagnosis not present

## 2017-10-26 ENCOUNTER — Encounter: Attending: Family Medicine | Primary: Family Medicine

## 2017-10-31 ENCOUNTER — Inpatient Hospital Stay: Payer: Medicare Other | Attending: Gynecologic Oncology

## 2017-10-31 DIAGNOSIS — C5702 Malignant neoplasm of left fallopian tube: Secondary | ICD-10-CM | POA: Insufficient documentation

## 2017-10-31 DIAGNOSIS — Z452 Encounter for adjustment and management of vascular access device: Secondary | ICD-10-CM | POA: Diagnosis not present

## 2017-10-31 DIAGNOSIS — Z95828 Presence of other vascular implants and grafts: Secondary | ICD-10-CM

## 2017-10-31 MED ORDER — SODIUM CHLORIDE 0.9% FLUSH
10.0000 mL | INTRAVENOUS | Status: DC | PRN
  Administered 2017-10-31: 10 mL via INTRAVENOUS
  Filled 2017-10-31: qty 10

## 2017-10-31 MED ORDER — HEPARIN SOD (PORK) LOCK FLUSH 100 UNIT/ML IV SOLN
500.0000 [IU] | Freq: Once | INTRAVENOUS | Status: AC | PRN
  Administered 2017-10-31: 500 [IU] via INTRAVENOUS
  Filled 2017-10-31: qty 5

## 2017-11-01 DIAGNOSIS — Z6826 Body mass index (BMI) 26.0-26.9, adult: Secondary | ICD-10-CM | POA: Diagnosis not present

## 2017-11-01 DIAGNOSIS — R7301 Impaired fasting glucose: Secondary | ICD-10-CM | POA: Diagnosis not present

## 2017-11-01 DIAGNOSIS — E7849 Other hyperlipidemia: Secondary | ICD-10-CM | POA: Diagnosis not present

## 2017-11-01 DIAGNOSIS — M25519 Pain in unspecified shoulder: Secondary | ICD-10-CM | POA: Diagnosis not present

## 2017-11-01 DIAGNOSIS — C5700 Malignant neoplasm of unspecified fallopian tube: Secondary | ICD-10-CM | POA: Diagnosis not present

## 2017-11-01 DIAGNOSIS — F4320 Adjustment disorder, unspecified: Secondary | ICD-10-CM | POA: Diagnosis not present

## 2017-11-01 DIAGNOSIS — M859 Disorder of bone density and structure, unspecified: Secondary | ICD-10-CM | POA: Diagnosis not present

## 2017-11-01 DIAGNOSIS — Z1389 Encounter for screening for other disorder: Secondary | ICD-10-CM | POA: Diagnosis not present

## 2017-11-01 DIAGNOSIS — R945 Abnormal results of liver function studies: Secondary | ICD-10-CM | POA: Diagnosis not present

## 2017-11-01 DIAGNOSIS — E559 Vitamin D deficiency, unspecified: Secondary | ICD-10-CM | POA: Diagnosis not present

## 2017-11-01 DIAGNOSIS — C439 Malignant melanoma of skin, unspecified: Secondary | ICD-10-CM | POA: Diagnosis not present

## 2017-11-01 DIAGNOSIS — Z0000 Encounter for general adult medical examination without abnormal findings: Secondary | ICD-10-CM | POA: Diagnosis not present

## 2017-11-10 ENCOUNTER — Ambulatory Visit (INDEPENDENT_AMBULATORY_CARE_PROVIDER_SITE_OTHER): Payer: Medicare Other | Admitting: Obstetrics & Gynecology

## 2017-11-10 ENCOUNTER — Encounter: Payer: Self-pay | Admitting: Obstetrics & Gynecology

## 2017-11-10 VITALS — BP 130/80 | Ht 63.75 in | Wt 156.0 lb

## 2017-11-10 DIAGNOSIS — Z01411 Encounter for gynecological examination (general) (routine) with abnormal findings: Secondary | ICD-10-CM | POA: Diagnosis not present

## 2017-11-10 DIAGNOSIS — Z9079 Acquired absence of other genital organ(s): Secondary | ICD-10-CM | POA: Diagnosis not present

## 2017-11-10 DIAGNOSIS — Z90722 Acquired absence of ovaries, bilateral: Secondary | ICD-10-CM

## 2017-11-10 DIAGNOSIS — M85851 Other specified disorders of bone density and structure, right thigh: Secondary | ICD-10-CM

## 2017-11-10 DIAGNOSIS — Z780 Asymptomatic menopausal state: Secondary | ICD-10-CM

## 2017-11-10 DIAGNOSIS — Z9189 Other specified personal risk factors, not elsewhere classified: Secondary | ICD-10-CM | POA: Diagnosis not present

## 2017-11-10 DIAGNOSIS — M8589 Other specified disorders of bone density and structure, multiple sites: Secondary | ICD-10-CM

## 2017-11-10 DIAGNOSIS — Z90710 Acquired absence of both cervix and uterus: Secondary | ICD-10-CM

## 2017-11-10 DIAGNOSIS — C5700 Malignant neoplasm of unspecified fallopian tube: Secondary | ICD-10-CM | POA: Diagnosis not present

## 2017-11-10 DIAGNOSIS — Z124 Encounter for screening for malignant neoplasm of cervix: Secondary | ICD-10-CM | POA: Diagnosis not present

## 2017-11-10 NOTE — Progress Notes (Signed)
Jessica Johnston 02-Aug-1941 [id number]   History:    78 y.o. G3P2A1L2 Married.  Has grand-children.  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  BRCA negative.  Complete clinical response on imaging and labs (CA 125). No evidence of recurrence, no abdo-pelvic pain.  CA 125 was normal and stable at 18.5 on 09/18/17. Still asymptomatic umbilical incisional hernia. Still declines surgical referral.  Breasts wnl.  Urine/BMs wnl.  BMI 26.99.  Physically active, good nutrition.  Past medical history,surgical history, family history and social history were all reviewed and documented in the EPIC chart.  Gynecologic History No LMP recorded. Patient is postmenopausal. Contraception: status post hysterectomy Last Pap:10/2016. Results were:  Negative Last mammogram: 10/2015. 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  GENERAL: No fevers or chills. HEENT: No change in vision, no earache, sore throat or sinus congestion. NECK: No pain or stiffness. CARDIOVASCULAR: No chest pain or pressure. No palpitations. PULMONARY: No shortness of breath, cough or wheeze. GASTROINTESTINAL: No abdominal pain, nausea, vomiting or diarrhea, melena or bright red blood per rectum.  GENITOURINARY: No urinary frequency, urgency, hesitancy or dysuria. MUSCULOSKELETAL: No joint or muscle pain, no back pain, no recent trauma. DERMATOLOGIC: No rash, no itching, no lesions. ENDOCRINE: No polyuria, polydipsia, no heat or cold intolerance. No recent change in weight. HEMATOLOGICAL: No anemia or easy bruising or bleeding. NEUROLOGIC: No headache, seizures, numbness, tingling or weakness. PSYCHIATRIC: No depression, no loss of interest in normal activity or change in sleep pattern.     Exam:   BP 130/80   Ht 5' 3.75" (1.619 m)   Wt 156 lb (70.8 kg)   BMI 26.99 kg/m   Body mass index is 26.99 kg/m.  General appearance : Well developed well nourished female. No acute distress HEENT: Eyes: no retinal hemorrhage or exudates,  Neck supple, trachea midline, no carotid bruits, no thyroidmegaly Lungs: Clear to auscultation, no rhonchi or wheezes, or rib retractions  Heart: Regular rate and rhythm, no murmurs or gallops Breast:Examined in sitting and supine position were symmetrical in appearance, no palpable masses or tenderness,  no skin retraction, no nipple inversion, no nipple discharge, no skin discoloration, no axillary or supraclavicular lymphadenopathy Abdomen: no palpable masses or tenderness, no rebound or guarding Extremities: no edema or skin discoloration or tenderness  Pelvic: Vulva normal  Bartholin, Urethra, Skene Glands: Within normal limits             Vagina: No gross lesions or discharge.  Pap reflex done.  Adnexa  Without masses or tenderness  Anus and perineum  normal   Assessment/Plan:  77 y.o. female for annual exam   1. Encounter for gynecological examination with abnormal finding Gynecologic exam status post total abdominal hysterectomy with bilateral salpingo-oophorectomy and staging procedure with debulking with Dr. Denman George.  Pap reflex of the vaginal vault done today.  Breast exam normal.  Will schedule screening mammogram now.  Overdue for colonoscopy.   Health labs with family physician.  2. Carcinoma of fallopian tube, unspecified laterality ([place])  H/O stage IIIC left fallopian tube cancer, s/p exploratory laparotomy, TAH, BSO, omentectomy, optimal cytoreductive surgery on 02/18/16 with Dr Denman George, s/p 6 cycles adjuvant carboplatin and paclitaxel completed 07/15/16.  BRCA negative.  Complete clinical response on imaging and labs (CA 125). No evidence of recurrence, no abdo-pelvic pain.  CA 125 was normal and stable at 18.5 on 09/18/17.  3. S/P TAH-BSO As above.  4. Screening for malignant neoplasm of cervix  - Pap IG w/ reflex to HPV when ASC-U  5. Menopause present Well on no hormone replacement therapy.  6. Osteopenia of neck of right femur Vitamin D supplements, calcium rich nutrition and regular weightbearing physical activity recommended.  Will repeat bone density in January 2020.  Counseling on above issues more than 50% for 15 minutes.  Princess Bruins MD, 11:42 AM 11/10/2017

## 2017-11-13 ENCOUNTER — Encounter: Payer: Self-pay | Admitting: Obstetrics & Gynecology

## 2017-11-13 NOTE — Patient Instructions (Addendum)
1. Encounter for gynecological examination with abnormal finding Gynecologic exam status post total abdominal hysterectomy with bilateral salpingo-oophorectomy and staging procedure with debulking with Dr. Denman George.  Pap reflex of the vaginal vault done today.  Breast exam normal.  Will schedule screening mammogram now.  Overdue for colonoscopy.  Health labs with family physician.  2. Carcinoma of fallopian tube, unspecified laterality ([place])  H/O stage IIIC left fallopian tube cancer, s/p exploratory laparotomy, TAH, BSO, omentectomy, optimal cytoreductive surgery on 02/18/16 with Dr Denman George, s/p 6 cycles adjuvant carboplatin and paclitaxel completed 07/15/16.  BRCA negative.  Complete clinical response on imaging and labs (CA 125). No evidence of recurrence, no abdo-pelvic pain.  CA 125 was normal and stable at 18.5 on 09/18/17.  3. S/P TAH-BSO As above.  4. Screening for malignant neoplasm of cervix  - Pap IG w/ reflex to HPV when ASC-U  5. Menopause present Well on no hormone replacement therapy.  6. Osteopenia of neck of right femur Vitamin D supplements, calcium rich nutrition and regular weightbearing physical activity recommended.  Will repeat bone density in January 2020.  Jessica Johnston, it was a pleasure meeting you today!  I will inform you of your results as soon as they are available.   Health Maintenance for Postmenopausal Women Menopause is a normal process in which your reproductive ability comes to an end. This process happens gradually over a span of months to years, usually between the ages of 53 and 89. Menopause is complete when you have missed 12 consecutive menstrual periods. It is important to talk with your health care provider about some of the most common conditions that affect postmenopausal women, such as heart disease, cancer, and bone loss (osteoporosis). Adopting a healthy lifestyle and getting preventive care can help to promote your health and wellness. Those actions can also  lower your chances of developing some of these common conditions. What should I know about menopause? During menopause, you may experience a number of symptoms, such as:  Moderate-to-severe hot flashes.  Night sweats.  Decrease in sex drive.  Mood swings.  Headaches.  Tiredness.  Irritability.  Memory problems.  Insomnia.  Choosing to treat or not to treat menopausal changes is an individual decision that you make with your health care provider. What should I know about hormone replacement therapy and supplements? Hormone therapy products are effective for treating symptoms that are associated with menopause, such as hot flashes and night sweats. Hormone replacement carries certain risks, especially as you become older. If you are thinking about using estrogen or estrogen with progestin treatments, discuss the benefits and risks with your health care provider. What should I know about heart disease and stroke? Heart disease, heart attack, and stroke become more likely as you age. This may be due, in part, to the hormonal changes that your body experiences during menopause. These can affect how your body processes dietary fats, triglycerides, and cholesterol. Heart attack and stroke are both medical emergencies. There are many things that you can do to help prevent heart disease and stroke:  Have your blood pressure checked at least every 1-2 years. High blood pressure causes heart disease and increases the risk of stroke.  If you are 42-52 years old, ask your health care provider if you should take aspirin to prevent a heart attack or a stroke.  Do not use any tobacco products, including cigarettes, chewing tobacco, or electronic cigarettes. If you need help quitting, ask your health care provider.  It is important to eat  a healthy diet and maintain a healthy weight. ? Be sure to include plenty of vegetables, fruits, low-fat dairy products, and lean protein. ? Avoid eating foods  that are high in solid fats, added sugars, or salt (sodium).  Get regular exercise. This is one of the most important things that you can do for your health. ? Try to exercise for at least 150 minutes each week. The type of exercise that you do should increase your heart rate and make you sweat. This is known as moderate-intensity exercise. ? Try to do strengthening exercises at least twice each week. Do these in addition to the moderate-intensity exercise.  Know your numbers.Ask your health care provider to check your cholesterol and your blood glucose. Continue to have your blood tested as directed by your health care provider.  What should I know about cancer screening? There are several types of cancer. Take the following steps to reduce your risk and to catch any cancer development as early as possible. Breast Cancer  Practice breast self-awareness. ? This means understanding how your breasts normally appear and feel. ? It also means doing regular breast self-exams. Let your health care provider know about any changes, no matter how small.  If you are 23 or older, have a clinician do a breast exam (clinical breast exam or CBE) every year. Depending on your age, family history, and medical history, it may be recommended that you also have a yearly breast X-ray (mammogram).  If you have a family history of breast cancer, talk with your health care provider about genetic screening.  If you are at high risk for breast cancer, talk with your health care provider about having an MRI and a mammogram every year.  Breast cancer (BRCA) gene test is recommended for women who have family members with BRCA-related cancers. Results of the assessment will determine the need for genetic counseling and BRCA1 and for BRCA2 testing. BRCA-related cancers include these types: ? Breast. This occurs in males or females. ? Ovarian. ? Tubal. This may also be called fallopian tube cancer. ? Cancer of the  abdominal or pelvic lining (peritoneal cancer). ? Prostate. ? Pancreatic.  Cervical, Uterine, and Ovarian Cancer Your health care provider may recommend that you be screened regularly for cancer of the pelvic organs. These include your ovaries, uterus, and vagina. This screening involves a pelvic exam, which includes checking for microscopic changes to the surface of your cervix (Pap test).  For women ages 21-65, health care providers may recommend a pelvic exam and a Pap test every three years. For women ages 82-65, they may recommend the Pap test and pelvic exam, combined with testing for human papilloma virus (HPV), every five years. Some types of HPV increase your risk of cervical cancer. Testing for HPV may also be done on women of any age who have unclear Pap test results.  Other health care providers may not recommend any screening for nonpregnant women who are considered low risk for pelvic cancer and have no symptoms. Ask your health care provider if a screening pelvic exam is right for you.  If you have had past treatment for cervical cancer or a condition that could lead to cancer, you need Pap tests and screening for cancer for at least 20 years after your treatment. If Pap tests have been discontinued for you, your risk factors (such as having a new sexual partner) need to be reassessed to determine if you should start having screenings again. Some women have medical  problems that increase the chance of getting cervical cancer. In these cases, your health care provider may recommend that you have screening and Pap tests more often.  If you have a family history of uterine cancer or ovarian cancer, talk with your health care provider about genetic screening.  If you have vaginal bleeding after reaching menopause, tell your health care provider.  There are currently no reliable tests available to screen for ovarian cancer.  Lung Cancer Lung cancer screening is recommended for adults  6-72 years old who are at high risk for lung cancer because of a history of smoking. A yearly low-dose CT scan of the lungs is recommended if you:  Currently smoke.  Have a history of at least 30 pack-years of smoking and you currently smoke or have quit within the past 15 years. A pack-year is smoking an average of one pack of cigarettes per day for one year.  Yearly screening should:  Continue until it has been 15 years since you quit.  Stop if you develop a health problem that would prevent you from having lung cancer treatment.  Colorectal Cancer  This type of cancer can be detected and can often be prevented.  Routine colorectal cancer screening usually begins at age 64 and continues through age 8.  If you have risk factors for colon cancer, your health care provider may recommend that you be screened at an earlier age.  If you have a family history of colorectal cancer, talk with your health care provider about genetic screening.  Your health care provider may also recommend using home test kits to check for hidden blood in your stool.  A small camera at the end of a tube can be used to examine your colon directly (sigmoidoscopy or colonoscopy). This is done to check for the earliest forms of colorectal cancer.  Direct examination of the colon should be repeated every 5-10 years until age 69. However, if early forms of precancerous polyps or small growths are found or if you have a family history or genetic risk for colorectal cancer, you may need to be screened more often.  Skin Cancer  Check your skin from head to toe regularly.  Monitor any moles. Be sure to tell your health care provider: ? About any new moles or changes in moles, especially if there is a change in a mole's shape or color. ? If you have a mole that is larger than the size of a pencil eraser.  If any of your family members has a history of skin cancer, especially at a young age, talk with your health  care provider about genetic screening.  Always use sunscreen. Apply sunscreen liberally and repeatedly throughout the day.  Whenever you are outside, protect yourself by wearing long sleeves, pants, a wide-brimmed hat, and sunglasses.  What should I know about osteoporosis? Osteoporosis is a condition in which bone destruction happens more quickly than new bone creation. After menopause, you may be at an increased risk for osteoporosis. To help prevent osteoporosis or the bone fractures that can happen because of osteoporosis, the following is recommended:  If you are 77-74 years old, get at least 1,000 mg of calcium and at least 600 mg of vitamin D per day.  If you are older than age 71 but younger than age 37, get at least 1,200 mg of calcium and at least 600 mg of vitamin D per day.  If you are older than age 69, get at least 1,200 mg  of calcium and at least 800 mg of vitamin D per day.  Smoking and excessive alcohol intake increase the risk of osteoporosis. Eat foods that are rich in calcium and vitamin D, and do weight-bearing exercises several times each week as directed by your health care provider. What should I know about how menopause affects my mental health? Depression may occur at any age, but it is more common as you become older. Common symptoms of depression include:  Low or sad mood.  Changes in sleep patterns.  Changes in appetite or eating patterns.  Feeling an overall lack of motivation or enjoyment of activities that you previously enjoyed.  Frequent crying spells.  Talk with your health care provider if you think that you are experiencing depression. What should I know about immunizations? It is important that you get and maintain your immunizations. These include:  Tetanus, diphtheria, and pertussis (Tdap) booster vaccine.  Influenza every year before the flu season begins.  Pneumonia vaccine.  Shingles vaccine.  Your health care provider may also  recommend other immunizations. This information is not intended to replace advice given to you by your health care provider. Make sure you discuss any questions you have with your health care provider. Document Released: 11/25/2005 Document Revised: 04/22/2016 Document Reviewed: 07/07/2015 Elsevier Interactive Patient Education  2018 [employer].

## 2017-11-14 LAB — PAP IG W/ RFLX HPV ASCU

## 2017-11-21 ENCOUNTER — Inpatient Hospital Stay: Admit: 2018-01-22 | Payer: MEDICARE | Primary: Family Medicine

## 2017-11-21 ENCOUNTER — Ambulatory Visit: Admit: 2017-11-21 | Discharge: 2017-11-21 | Payer: MEDICARE | Attending: Family Medicine | Primary: Family Medicine

## 2017-11-21 DIAGNOSIS — E782 Mixed hyperlipidemia: Secondary | ICD-10-CM

## 2017-11-21 NOTE — Progress Notes (Signed)
Your triglycerides remain quite high.  I need to to stop in and get another blood draw to add on your diabetes marker and to check the LDL level since your trigs were too high to get a reading on this.  No appt needed but you do need to be fasting.

## 2017-11-21 NOTE — Patient Instructions (Signed)
A Healthy Lifestyle: Care Instructions  Your Care Instructions    A healthy lifestyle can help you feel good, stay at a healthy weight, and have plenty of energy for both work and play. A healthy lifestyle is something you can share with your whole family.  A healthy lifestyle also can lower your risk for serious health problems, such as high blood pressure, heart disease, and diabetes.  You can follow a few steps listed below to improve your health and the health of your family.  Follow-up care is a key part of your treatment and safety. Be sure to make and go to all appointments, and call your doctor if you are having problems. It's also a good idea to know your test results and keep a list of the medicines you take.  How can you care for yourself at home?  ?? Do not eat too much sugar, fat, or fast foods. You can still have dessert and treats now and then. The goal is moderation.  ?? Start small to improve your eating habits. Pay attention to portion sizes, drink less juice and soda pop, and eat more fruits and vegetables.  ? Eat a healthy amount of food. A 3-ounce serving of meat, for example, is about the size of a deck of cards. Fill the rest of your plate with vegetables and whole grains.  ? Limit the amount of soda and sports drinks you have every day. Drink more water when you are thirsty.  ? Eat at least 5 servings of fruits and vegetables every day. It may seem like a lot, but it is not hard to reach this goal. A serving or helping is 1 piece of fruit, 1 cup of vegetables, or 2 cups of leafy, raw vegetables. Have an apple or some carrot sticks as an afternoon snack instead of a candy bar. Try to have fruits and/or vegetables at every meal.  ?? Make exercise part of your daily routine. You may want to start with simple activities, such as walking, bicycling, or slow swimming. Try to be active 30 to 60 minutes every day. You do not need to do all 30 to 60  minutes all at once. For example, you can exercise 3 times a day for 10 or 20 minutes. Moderate exercise is safe for most people, but it is always a good idea to talk to your doctor before starting an exercise program.  ?? Keep moving. Mow the lawn, work in the garden, or clean your house. Take the stairs instead of the elevator at work.  ?? If you smoke, quit. People who smoke have an increased risk for heart attack, stroke, cancer, and other lung illnesses. Quitting is hard, but there are ways to boost your chance of quitting tobacco for good.  ? Use nicotine gum, patches, or lozenges.  ? Ask your doctor about stop-smoking programs and medicines.  ? Keep trying.  In addition to reducing your risk of diseases in the future, you will notice some benefits soon after you stop using tobacco. If you have shortness of breath or asthma symptoms, they will likely get better within a few weeks after you quit.  ?? Limit how much alcohol you drink. Moderate amounts of alcohol (up to 2 drinks a day for men, 1 drink a day for women) are okay. But drinking too much can lead to liver problems, high blood pressure, and other health problems.  Family health  If you have a family, there are many things you   can do together to improve your health.  ?? Eat meals together as a family as often as possible.  ?? Eat healthy foods. This includes fruits, vegetables, lean meats and dairy, and whole grains.  ?? Include your family in your fitness plan. Most people think of activities such as jogging or tennis as the way to fitness, but there are many ways you and your family can be more active. Anything that makes you breathe hard and gets your heart pumping is exercise. Here are some tips:  ? Walk to do errands or to take your child to school or the bus.  ? Go for a family bike ride after dinner instead of watching TV.  Where can you learn more?  Go to http://www.healthwise.net/[employer].   Enter U807 in the search box to learn more about "A Healthy Lifestyle: Care Instructions."  Current as of: June 27, 2017  Content Version: 11.9  ?? 2006-2018 [employer]. Care instructions adapted under license by [employer] (which disclaims liability or warranty for this information). If you have questions about a medical condition or this instruction, always ask your healthcare professional. Healthwise, Inc[employer] disclaims any warranty or liability for your use of this information.

## 2017-11-21 NOTE — Progress Notes (Signed)
Chief Complaint   Patient presents with   ??? Follow-up   ??? Labs     Patient presents in office today for f/u and fasting labs.   Has c/o short term memory loss.   Would like to discuss her losartan-HCTZ due to the recall.  No other concerns.    1. Have you been to the ER, urgent care clinic since your last visit?  Hospitalized since your last visit?Yes When: 08/09/17 ED at [facility] for fall    2. Have you seen or consulted any other health care providers outside of the [facility] since your last visit?  Include any pap smears or colon screening. Yes When: 07/2017 Dr. Malvin Johns dermatology    Learning Assessment 05/06/2013   PRIMARY LEARNER Patient   HIGHEST LEVEL OF EDUCATION - PRIMARY LEARNER  SOME COLLEGE   BARRIERS PRIMARY LEARNER NONE   CO-LEARNER CAREGIVER No   PRIMARY LANGUAGE ENGLISH   INTERPRETER NEED No   LEARNER PREFERENCE PRIMARY READING     DEMONSTRATION     LISTENING   LEARNING SPECIAL TOPICS n/a   ANSWERED BY patient   RELATIONSHIP SELF

## 2017-11-21 NOTE — Progress Notes (Signed)
Dawn Green is a 77 y.o. female   Chief Complaint   Patient presents with   ??? Follow-up   ??? Labs   ??? Memory Loss    pt here for follow up for her triglycerides and states she is taking 500mg  krill oil bid.  Pt also on losartan hctz and is concerned regarding recall and pt will contact pharm regarding whether her particular one is affected and if it is I will change medication.      Pt also reports issues with short term memory loss specifically difficulty with word finding at times.  Pt is on arimidex and is concerned regarding this.      she is a 77 y.o. year old female who presents for evalution.      Reviewed PmHx, RxHx, FmHx, SocHx, AllgHx and updated and dated in the chart.    Review of Systems - negative except as listed above in the HPI    Objective:     Vitals:    11/21/17 1036   BP: 134/63   Pulse: 75   Resp: 16   Temp: 97.6 ??F (36.4 ??C)   TempSrc: Oral   SpO2: 97%   Weight: 163 lb (73.9 kg)   Height: 5\' 3"  (1.6 m)       Current Outpatient Medications   Medication Sig   ??? losartan-hydroCHLOROthiazide (HYZAAR) 50-12.5 mg per tablet Take 1 Tab by mouth daily.   ??? venlafaxine-SR (EFFEXOR-XR) 75 mg capsule Take 1 Cap by mouth daily.   ??? alendronate (FOSAMAX) 35 mg tablet TAKE 1 TABLET EVERY 7 DAYS   ??? ergocalciferol (VITAMIN D2) 50,000 unit capsule TAKE 1 CAPSULE EVERY 7 DAYS   ??? atorvastatin (LIPITOR) 40 mg tablet Take 1 Tab by mouth Daily (before breakfast).   ??? anastrozole (ARIMIDEX) 1 mg tablet Take 1 Tab by mouth daily.   ??? krill-om-3-dha-epa-phospho-ast (MEGARED OMEGA-3 KRILL OIL) 1,000-230-60 mg cap Take 1 Tab by mouth two (2) times a day.   ??? brimonidine-timolol (COMBIGAN) 0.2-0.5 % drop ophthalmic solution Administer 1 Drop to both eyes every twelve (12) hours.   ??? aspirin 81 mg chewable tablet Take 81 mg by mouth daily.   ??? fluorouracil (EFUDEX) 5 % chemo cream Indications: never received   ??? Cetirizine (ZYRTEC) 10 mg cap Take  by mouth as needed.      No current facility-administered medications for this visit.        Physical Examination: General appearance - alert, well appearing, and in no distress  Mental status - alert, oriented to person, place, and time  Chest - clear to auscultation, no wheezes, rales or rhonchi, symmetric air entry  Heart - normal rate, regular rhythm, normal S1, S2, no murmurs, rubs, clicks or gallops  Abdomen - soft, nontender, nondistended, no masses or organomegaly    Pt passed 3 word recall and clock draw   Assessment/ Plan:   Diagnoses and all orders for this visit:    1. Mixed hyperlipidemia  -     METABOLIC PANEL, COMPREHENSIVE  -     LIPID PANEL    2. Memory loss  -     TSH 3RD GENERATION  -     VITAMIN B12 & FOLATE     pt will meet with onc to discuss her arimidex  Follow-up Disposition:  Return if symptoms worsen or fail to improve.    I have discussed the diagnosis with the patient and the intended plan as seen in the above orders.  The patient has received an after-visit summary and questions were answered concerning future plans. Pt conveyed understanding of plan.    Medication Side Effects and Warnings were discussed with patient      Dawn Grew, DO

## 2017-11-23 ENCOUNTER — Encounter

## 2017-11-23 DIAGNOSIS — F4320 Adjustment disorder, unspecified: Secondary | ICD-10-CM | POA: Diagnosis not present

## 2017-11-23 LAB — CVD REPORT

## 2017-11-23 LAB — LIPID PANEL
Cholesterol, total: 159 mg/dL (ref 100–199)
HDL Cholesterol: 37 mg/dL — ABNORMAL LOW (ref >39)
Triglyceride: 419 mg/dL — ABNORMAL HIGH (ref 0–149)

## 2017-11-23 LAB — METABOLIC PANEL, COMPREHENSIVE
A-G Ratio: 1.8 (ref 1.2–2.2)
ALT (SGPT): 18 IU/L (ref 0–32)
AST (SGOT): 14 IU/L (ref 0–40)
Albumin: 4.4 g/dL (ref 3.5–4.8)
Alk. phosphatase: 61 IU/L (ref 39–117)
BUN/Creatinine ratio: 26 (ref 12–28)
BUN: 16 mg/dL (ref 8–27)
Bilirubin, total: 0.5 mg/dL (ref 0.0–1.2)
CO2: 25 mmol/L (ref 20–29)
Calcium: 9.4 mg/dL (ref 8.7–10.3)
Chloride: 103 mmol/L (ref 96–106)
Creatinine: 0.61 mg/dL (ref 0.57–1.00)
GFR est AA: 102 mL/min/{1.73_m2} (ref >59)
GFR est non-AA: 88 mL/min/{1.73_m2} (ref >59)
GLOBULIN, TOTAL: 2.5 g/dL (ref 1.5–4.5)
Glucose: 126 mg/dL — ABNORMAL HIGH (ref 65–99)
Potassium: 4 mmol/L (ref 3.5–5.2)
Protein, total: 6.9 g/dL (ref 6.0–8.5)
Sodium: 145 mmol/L — ABNORMAL HIGH (ref 134–144)

## 2017-11-23 LAB — VITAMIN B12 & FOLATE
Folate: >20 ng/mL (ref >3.0)
Vitamin B12: 517 pg/mL (ref 232–1245)

## 2017-11-23 LAB — TSH 3RD GENERATION: TSH: 2.13 u[IU]/mL (ref 0.450–4.500)

## 2017-11-24 DIAGNOSIS — F4322 Adjustment disorder with anxiety: Secondary | ICD-10-CM | POA: Diagnosis not present

## 2017-11-29 ENCOUNTER — Encounter

## 2017-11-29 MED ORDER — ALENDRONATE 35 MG TAB
35 mg | ORAL_TABLET | ORAL | 3 refills | Status: DC
Start: 2017-11-29 — End: 2018-11-26

## 2017-12-19 ENCOUNTER — Inpatient Hospital Stay: Payer: Medicare Other | Attending: Gynecologic Oncology

## 2017-12-19 DIAGNOSIS — Z452 Encounter for adjustment and management of vascular access device: Secondary | ICD-10-CM | POA: Diagnosis not present

## 2017-12-19 DIAGNOSIS — C5702 Malignant neoplasm of left fallopian tube: Secondary | ICD-10-CM | POA: Diagnosis not present

## 2017-12-19 DIAGNOSIS — Z95828 Presence of other vascular implants and grafts: Secondary | ICD-10-CM

## 2017-12-19 MED ORDER — SODIUM CHLORIDE 0.9% FLUSH
10.0000 mL | INTRAVENOUS | Status: DC | PRN
  Administered 2017-12-19: 10 mL via INTRAVENOUS
  Filled 2017-12-19: qty 10

## 2017-12-19 MED ORDER — HEPARIN SOD (PORK) LOCK FLUSH 100 UNIT/ML IV SOLN
500.0000 [IU] | Freq: Once | INTRAVENOUS | Status: AC | PRN
  Administered 2017-12-19: 500 [IU] via INTRAVENOUS
  Filled 2017-12-19: qty 5

## 2017-12-28 ENCOUNTER — Encounter

## 2017-12-28 MED ORDER — ATORVASTATIN 40 MG TAB
40 mg | ORAL_TABLET | ORAL | 3 refills | Status: DC
Start: 2017-12-28 — End: 2019-03-26

## 2018-01-01 DIAGNOSIS — Z85828 Personal history of other malignant neoplasm of skin: Secondary | ICD-10-CM | POA: Diagnosis not present

## 2018-01-01 DIAGNOSIS — Z08 Encounter for follow-up examination after completed treatment for malignant neoplasm: Secondary | ICD-10-CM | POA: Diagnosis not present

## 2018-01-01 DIAGNOSIS — Z85820 Personal history of malignant melanoma of skin: Secondary | ICD-10-CM | POA: Diagnosis not present

## 2018-01-01 DIAGNOSIS — L570 Actinic keratosis: Secondary | ICD-10-CM | POA: Diagnosis not present

## 2018-01-01 DIAGNOSIS — L820 Inflamed seborrheic keratosis: Secondary | ICD-10-CM | POA: Diagnosis not present

## 2018-01-02 ENCOUNTER — Telehealth: Payer: Self-pay | Admitting: Gynecologic Oncology

## 2018-01-02 DIAGNOSIS — F4322 Adjustment disorder with anxiety: Secondary | ICD-10-CM | POA: Diagnosis not present

## 2018-01-02 MED ORDER — ANASTROZOLE 1 MG TAB
1 mg | ORAL_TABLET | Freq: Every day | ORAL | 1 refills | Status: DC
Start: 2018-01-02 — End: 2021-04-01

## 2018-01-02 NOTE — Telephone Encounter (Signed)
Returned call to patient's son.  He had called the office voicing concerns about his mother's "slippage of memory and forgetfulness" lately.  He did not want her to know that he reached out in order to not hurt her feelings but he states he can see at times she gets frustrated when she is unable to remember things.  He was asking about an evaluation for his mother.  Her next follow up appt with Dr. Denman George is on April 10.  Advised him that his concerns will be discussed with Dr. Denman George prior to that appt.  Advised him to call for any needs or concerns prior to that appt.

## 2018-01-10 ENCOUNTER — Encounter

## 2018-01-10 MED ORDER — ERGOCALCIFEROL (VITAMIN D2) 50,000 UNIT CAP
1250 mcg (50,000 unit) | ORAL_CAPSULE | ORAL | 4 refills | Status: DC
Start: 2018-01-10 — End: 2019-05-03

## 2018-01-15 MED ORDER — VALSARTAN-HYDROCHLOROTHIAZIDE 80 MG-12.5 MG TAB
ORAL_TABLET | ORAL | 3 refills | Status: DC
Start: 2018-01-15 — End: 2018-12-25

## 2018-01-22 ENCOUNTER — Encounter: Attending: Specialist | Primary: Family Medicine

## 2018-01-23 ENCOUNTER — Inpatient Hospital Stay: Payer: Medicare Other

## 2018-01-23 ENCOUNTER — Inpatient Hospital Stay: Payer: Medicare Other | Attending: Gynecologic Oncology

## 2018-01-23 DIAGNOSIS — C5702 Malignant neoplasm of left fallopian tube: Secondary | ICD-10-CM | POA: Diagnosis not present

## 2018-01-23 DIAGNOSIS — Z90722 Acquired absence of ovaries, bilateral: Secondary | ICD-10-CM | POA: Diagnosis not present

## 2018-01-23 DIAGNOSIS — Z9221 Personal history of antineoplastic chemotherapy: Secondary | ICD-10-CM | POA: Diagnosis not present

## 2018-01-23 DIAGNOSIS — Z90710 Acquired absence of both cervix and uterus: Secondary | ICD-10-CM | POA: Insufficient documentation

## 2018-01-23 DIAGNOSIS — Z95828 Presence of other vascular implants and grafts: Secondary | ICD-10-CM

## 2018-01-23 DIAGNOSIS — Z85820 Personal history of malignant melanoma of skin: Secondary | ICD-10-CM | POA: Diagnosis not present

## 2018-01-23 MED ORDER — SODIUM CHLORIDE 0.9% FLUSH
10.0000 mL | Freq: Once | INTRAVENOUS | Status: AC
  Administered 2018-01-23: 10 mL
  Filled 2018-01-23: qty 10

## 2018-01-23 MED ORDER — HEPARIN SOD (PORK) LOCK FLUSH 100 UNIT/ML IV SOLN
500.0000 [IU] | Freq: Once | INTRAVENOUS | Status: AC
  Administered 2018-01-23: 500 [IU]
  Filled 2018-01-23: qty 5

## 2018-01-24 ENCOUNTER — Encounter: Payer: Self-pay | Admitting: Gynecologic Oncology

## 2018-01-24 ENCOUNTER — Inpatient Hospital Stay: Payer: Medicare Other

## 2018-01-24 ENCOUNTER — Inpatient Hospital Stay (HOSPITAL_BASED_OUTPATIENT_CLINIC_OR_DEPARTMENT_OTHER): Payer: Medicare Other | Admitting: Gynecologic Oncology

## 2018-01-24 VITALS — BP 119/58 | HR 68 | Temp 97.7°F | Resp 18 | Ht 64.0 in | Wt 157.4 lb

## 2018-01-24 DIAGNOSIS — Z90710 Acquired absence of both cervix and uterus: Secondary | ICD-10-CM

## 2018-01-24 DIAGNOSIS — C569 Malignant neoplasm of unspecified ovary: Secondary | ICD-10-CM | POA: Diagnosis not present

## 2018-01-24 DIAGNOSIS — R1907 Generalized intra-abdominal and pelvic swelling, mass and lump: Secondary | ICD-10-CM | POA: Diagnosis not present

## 2018-01-24 DIAGNOSIS — Z85820 Personal history of malignant melanoma of skin: Secondary | ICD-10-CM | POA: Diagnosis not present

## 2018-01-24 DIAGNOSIS — Z9221 Personal history of antineoplastic chemotherapy: Secondary | ICD-10-CM

## 2018-01-24 DIAGNOSIS — C5702 Malignant neoplasm of left fallopian tube: Secondary | ICD-10-CM | POA: Diagnosis not present

## 2018-01-24 DIAGNOSIS — Z90722 Acquired absence of ovaries, bilateral: Secondary | ICD-10-CM

## 2018-01-24 LAB — BASIC METABOLIC PANEL
ANION GAP: 7 (ref 3–11)
BUN: 12 mg/dL (ref 7–26)
CO2: 26 mmol/L (ref 22–29)
CREATININE: 0.78 mg/dL (ref 0.60–1.10)
Calcium: 9.6 mg/dL (ref 8.4–10.4)
Chloride: 108 mmol/L (ref 98–109)
Glucose, Bld: 118 mg/dL (ref 70–140)
Potassium: 4.4 mmol/L (ref 3.5–5.1)
SODIUM: 141 mmol/L (ref 136–145)

## 2018-01-24 LAB — CA 125: Cancer Antigen (CA) 125: 35.4 U/mL (ref 0.0–38.1)

## 2018-01-24 NOTE — Patient Instructions (Signed)
Your CA 125 has increased from 19 to 35 suggesting that there is likely an early recurrence of your cancer. Treatment need not be started until you are symptomatic, but can be started sooner if you would prefer. Treatment would be a combination of carboplatin chemotherapy with a second agent followed by the PARP inhibitor therapy, niraparib, which is a tablet.  Dr Denman George has scheduled a CT scan to better evaluate the disease. She will see you in the office in early June to reassess symptoms.

## 2018-01-24 NOTE — Progress Notes (Signed)
Follow-up Note: Gyn-Onc  Consult was initially requested by Dr. Toney Rakes for the evaluation of Jessica Johnston 77 y.o. female  CC:  Chief Complaint  Patient presents with  . Malignant neoplasm of ovary, unspecified laterality ([place])  . Generalized intra-abdominal and pelvic swelling, mass and lu    Assessment/Plan:  Jessica Johnston  is a 77 y.o.  year old with stage IIIC left fallopian tube cancer, s/p exploratory laparotomy, TAH, BSO, omentectomy, optimal cytoreductive surgery on 02/18/16 s/p 6 cycles adjuvant carboplatin and paclitaxel completed 07/15/16. BRCA negative. Doubling of CA 125. Will obtain CT abd/pelvis to evaluate for measurable recurrence.  Discussed with the Pasion's that there is a high likelihood that her cancer has recurred. Options for therapy would be expectant management until symptomatic vs commencing platinum based doublet therapy (carb/tax vs carb/doxil followed by niraparib maintenance). I discussed that average life expectancy is equivalent for both options.   Asymptomatic umbilical incisional hernia. Declines surgical referral.  Jessica Johnston is electing for expectant management at this time. We will see her again in June and determine if she would like to start therapy based on symptoms.   HPI: Jessica Johnston is a 77 year old para's woman who is seen in consultation at the request of Dr. Toney Rakes for abdominal peritoneal carcinomatosis and omental caking. The patient has a known history of a 3 cm left ovarian cyst for which she saw my partner approximately 15 months ago. At that time her CA-125 is mildly elevated to 49 in her Roma 1 score was also elevated. However the left ovarian cyst was unilocular, measured only 3 cm, and was long-standing, and therefore suspicion for occult malignancy was low.  In November 2016 she began feeling central and upper abdominal discomfort. Initially she thought it was part of the grieving process she lost her grandson. However when the pain and  discomfort persisted she sought evaluation by her providers. A gynecologic evaluation was unremarkable. However eventually she underwent CT scan imaging on 01/27/2016 for this persistent midabdominal pain. CT imaging showed a liver containing a few small low-density lesions which were equivocal for the potential of metastatic disease these were subcentimeter in dimension. There was an extensive omental nodularity which was highly worrisome for peritoneal carcinomatosis. A 3.5 cm stable left adnexal simple cyst was identified. This was not typical for ovarian cancer. There were no other obvious signs of metastatic disease including no gross ascites.  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 She is not required lymphadenectomy for any of these melanoma lesions. She is no family history significant for breast or ovarian cancer.   On 02/18/16 she underwent an exploratory laparotomy, TAH, BSO, omentectomy, radical tumor debulking and argon beam coagulation of milliary tumor implants. Disease was predominantly in the omentum with milliary studding of the ovaries and peritoneal surfaces. There was a right ovarian cyst measuring 3cm. There was no gross residual disease at completion of surgery with exception of the residual nodules that had received argon beam coagulation.  Postoperatively she did well with no complications.  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 The right ovarian cyst (that had been present in 2015) was benign.   She completed 6 cycles of adjuvant chemotherapy with carboplatin and paclitaxel on 07/15/16.   CA 125 was stable and normal at 17.2 on 08/08/16. CT abdo/pelvis on 08/05/16 showed no evidence for persistent disease.  CA 125  was stable and normal at 13.6 on 11/04/16, and 13.8 on 01/20/17.  Her sister was diagnosed with ovarian cancer in December, 2017. Jessica Johnston is BRCA  negative. Her sister is as yet untested.   CA 125 on 04/13/17 was normal at 16.1.  CT abdo/pelvis on 06/20/17 showed a small supraumbilical hernia, but no other abnormalities and no evidence of metastatic or recurrent disease. CA 125 was normal at 18.4 in September, 2018. Repeat CA 125 was normal and stable at 18.5 on 09/18/17.   Interval Hx:  Jessica Johnston reports some mild abdominal bloating. She denies pain. Her hernia is mildly symptomatic. She does not have symptoms of bowel obstruction.  CA 125 on 01/23/18 had increased from 18 to 35.4 suggestive of recurrence.   Current Meds:  Outpatient Encounter Medications as of 01/24/2018  Medication Sig  . Cholecalciferol (VITAMIN D) 2000 units CAPS Take 1 capsule by mouth daily.  Marland Kitchen ezetimibe (ZETIA) 10 MG tablet Take 10 mg by mouth daily.   Facility-Administered Encounter Medications as of 01/24/2018  Medication  . 0.9 %  sodium chloride infusion    Allergy: No Known Allergies  Social Hx:   Social History   Socioeconomic History  . Marital status: Married    Spouse name: Not on file  . Number of children: 2  . Years of education: Not on file  . Highest education level: Not on file  Occupational History  . Not on file  Social Needs  . Financial resource strain: Not on file  . Food insecurity:    Worry: Not on file    Inability: Not on file  . Transportation needs:    Medical: Not on file    Non-medical: Not on file  Tobacco Use  . Smoking status: Never Smoker  . Smokeless tobacco: Never Used  Substance and Sexual Activity  . Alcohol use: Yes    Alcohol/week: 5.4 oz    Types: 9 Glasses of wine per week    Comment: 9 GLASSES OF WINE A WEEK   . Drug use: No  . Sexual activity: Not Currently    Comment: 1ST INTERCOURSE- 21, PARTNERS- 1, MARRIED- 82 YRS   Lifestyle  . Physical activity:    Days per week: Not on file    Minutes per session: Not on file  . Stress: Not on file  Relationships  . Social connections:    Talks on  phone: Not on file    Gets together: Not on file    Attends religious service: Not on file    Active member of club or organization: Not on file    Attends meetings of clubs or organizations: Not on file    Relationship status: Not on file  . Intimate partner violence:    Fear of current or ex partner: Not on file    Emotionally abused: Not on file    Physically abused: Not on file    Forced sexual activity: Not on file  Other Topics Concern  . Not on file  Social History Narrative  . Not on file    Past Surgical Hx:  Past Surgical History:  Procedure Laterality Date  . ABDOMINAL HYSTERECTOMY N/A 02/18/2016   Procedure: HYSTERECTOMY ABDOMINAL;  Surgeon: Everitt Amber, MD;  Location: [facility];  Service: Gynecology;  Laterality: N/A;  . DEBULKING N/A 02/18/2016   Procedure: RADICAL TUMOR DEBULKING;  Surgeon: Everitt Amber, MD;  Location: [facility];  Service: Gynecology;  Laterality: N/A;  . HYSTEROSCOPY    . LAPAROTOMY N/A  02/18/2016   Procedure: EXPLORATORY LAPAROTOMY;  Surgeon: Everitt Amber, MD;  Location: [facility];  Service: Gynecology;  Laterality: N/A;  . omental mass     Biopsy, CT guided  . OMENTECTOMY N/A 02/18/2016   Procedure: OMENTECTOMY;  Surgeon: Everitt Amber, MD;  Location: [facility];  Service: Gynecology;  Laterality: N/A;  . SALPINGOOPHORECTOMY Bilateral 02/18/2016   Procedure: BILATERAL SALPINGO OOPHORECTOMY;  Surgeon: Everitt Amber, MD;  Location: [facility];  Service: Gynecology;  Laterality: Bilateral;  . TONSILLECTOMY AND ADENOIDECTOMY  1948    Past Medical Hx:  Past Medical History:  Diagnosis Date  . Arthritis    arthritis -hands-knees.  . Cancer ([place])    MELANOMA. multiple x5 different sites. last 10 yrs ago.  Marland Kitchen Headache    past hx.-no in awhile.  . High cholesterol   . Melanoma ([place])   . Osteopenia   . Skin cancer    melanoma    Past Gynecological History:  SVD x 2  No LMP recorded. Patient is postmenopausal.  Family Hx:  Family History  Problem Relation Age of Onset  .  Colitis Sister   . Parkinsonism Maternal Grandmother   . Heart disease Paternal Grandmother   . Heart disease Paternal Grandfather   . Cancer Cousin 12       paternal first cousin  . Skin cancer Son     Review of Systems:  Constitutional  Feels well,    ENT Normal appearing ears and nares bilaterally Skin/Breast  No rash, sores, jaundice, itching, dryness Cardiovascular  No chest pain, shortness of breath, or edema  Pulmonary  No cough or wheeze.  Gastro Intestinal  No abdominal pain or difficulty eating Genito Urinary  No frequency, urgency, dysuria, no bleeding Musculo Skeletal  No myalgia, arthralgia, joint swelling or pain  Neurologic  No weakness, numbness, change in gait,  Psychology  No depression, anxiety, insomnia.   Vitals:  Blood pressure (!) 119/58, pulse 68, temperature 97.7 F (36.5 C), temperature source Oral, resp. rate 18, height '5\' 4"'$  (1.626 m), weight 157 lb 6.4 oz (71.4 kg), SpO2 100 %.  Physical Exam: WD in NAD Neck  Supple NROM, without any enlargements.  Lymph Node Survey No cervical supraclavicular or inguinal adenopathy Cardiovascular  Pulse normal rate, regularity and rhythm. S1 and S2 normal.  Lungs  Clear to auscultation bilateraly, without wheezes/crackles/rhonchi. Good air movement.  Skin  No rash/lesions/breakdown  Psychiatry  Alert and oriented to person, place, and time  Abdomen  Normoactive bowel sounds, abdomen soft, non-tender and mildly overweight (BMI 26) with soft reducible ventral hernia at level of umbilicus. Well healed incision. No palpable masses. However there is some subtle palpable generalized distension in the abdomen.  Back No CVA tenderness Genito Urinary  Vulva/vagina: Normal external female genitalia.  No lesions. No discharge or bleeding.  Bladder/urethra:  No lesions or masses, well supported bladder  Vagina: smooth, no masses or lesions.  Cervix: surgically absent  Uterus:  Surgically absent  Adnexa: no  palpable masses. Rectal  No posterior pelvic masses  Extremities  No bilateral cyanosis, clubbing or edema.  Thereasa Solo, MD  01/24/2018, 2:01 PM

## 2018-01-26 ENCOUNTER — Encounter

## 2018-01-26 NOTE — Telephone Encounter (Signed)
Patient called and left a voicemail that she went to schedule her mammogram at [facility], and she was unable to do so because there was no order put in. Patient requests an order be put in, and it be sent to [facility]. If there are any questions or concerns patient requests a call back CB # [phone]

## 2018-01-29 ENCOUNTER — Telehealth: Payer: Self-pay

## 2018-01-29 NOTE — Telephone Encounter (Signed)
Pt's son left a VM for Melissa NP, no specific message left.  I called pt's son back and he said he would like Melissa NP to return his call, he would like to know "how visit went?" - pt had a recent appt with Dr Denman George.  I told him I would pass message along to [facility] NP in which I did at this time.

## 2018-01-30 ENCOUNTER — Encounter (HOSPITAL_COMMUNITY): Payer: Self-pay | Admitting: Radiology

## 2018-01-30 ENCOUNTER — Ambulatory Visit (HOSPITAL_COMMUNITY)
Admission: RE | Admit: 2018-01-30 | Discharge: 2018-01-30 | Disposition: A | Discharge status: Home / Self Care | Payer: Medicare Other | Source: Ambulatory Visit | Attending: Gynecologic Oncology | Admitting: Gynecologic Oncology

## 2018-01-30 DIAGNOSIS — R109 Unspecified abdominal pain: Secondary | ICD-10-CM | POA: Diagnosis not present

## 2018-01-30 DIAGNOSIS — R1907 Generalized intra-abdominal and pelvic swelling, mass and lump: Secondary | ICD-10-CM | POA: Insufficient documentation

## 2018-01-30 DIAGNOSIS — Z90710 Acquired absence of both cervix and uterus: Secondary | ICD-10-CM | POA: Diagnosis not present

## 2018-01-30 MED ORDER — IOHEXOL 300 MG/ML  SOLN
100.0000 mL | Freq: Once | INTRAMUSCULAR | Status: AC | PRN
  Administered 2018-01-30: 100 mL via INTRAVENOUS

## 2018-02-01 ENCOUNTER — Telehealth: Payer: Self-pay | Admitting: Gynecologic Oncology

## 2018-02-01 NOTE — Telephone Encounter (Signed)
Left message for patient.  Was attempting to call patient with CT scan results.

## 2018-02-02 ENCOUNTER — Telehealth: Payer: Self-pay | Admitting: Gynecologic Oncology

## 2018-02-02 NOTE — Telephone Encounter (Signed)
Patient informed of CT scan results.  She states she would like to have a flush in six weeks but will hold off on having a repeat check of her CA 125.  She is to follow up with Dr. Denman George on June 6 or sooner if needed.  She is advised to call for any needs.

## 2018-02-12 ENCOUNTER — Ambulatory Visit: Admit: 2018-02-12 | Discharge: 2018-02-12 | Payer: MEDICARE | Attending: Nurse Practitioner | Primary: Family Medicine

## 2018-02-12 DIAGNOSIS — C50911 Malignant neoplasm of unspecified site of right female breast: Secondary | ICD-10-CM

## 2018-02-12 NOTE — Progress Notes (Signed)
[facility]  [address] [place]   [address]  W: [phone]   F: [phone]      f/u HEME/ONC CONSULT    Reason for visit: management of breast cancer     Consulting physician:  Dr. Gilford Rile  Referring physician:  Dr. Minette Brine    HPI:   Dawn Green is a 77 y.o.  female who I am seeing in f/u for management of breast cancer.  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  Right lumpectomy on 04/24/13 showed 1.5 cm IDC, gr 2, no LVI, HER 2 negative (ratio 1.2, sig/cell 3), 0/3 LN involved, DCIS present with extensive intraductal component.    Was on estrogen patch for 10 years, stopped at diagnosis.      oncotype Dx = 23, intermediate, RR = 15%, ER + at 9.7, PR + at 7.5; HER 2 negative at 8.7    05/08/13 re-ex negative    S/p XRT from 06/19/13-07/10/13    Started anastrozole 07/17/13    Interval history: In today for follow up.  Complains of gr 2 loss of appetite, gr 2 fatigue, gr 2 insomnia, gr 2 cognition, gr 2 depression 3/10 pain to back, gr 1 cough, gr 1 itching, gr 1 libido, gr 3 vaginal dryness.    Normal colonoscopy summer 2015        DX   Encounter Diagnoses   Name Primary?   ??? Malignant neoplasm of right breast in female, estrogen receptor positive, unspecified site of breast ([place]) Yes   ??? Osteopenia, unspecified location    ??? Depression, unspecified depression type    ??? 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 Hot flashes    ??? Postmenopausal    ??? Fatigue, unspecified type                 Past Medical History:   Diagnosis Date   ??? Breast CA ([place]) 2014    Right - Lumpectomy    ??? Diabetes ([place])    ??? Glaucoma    ??? Hx of mammogram 02/05/2016    Negative per pt. Sees Dr. Laurena Bering    ??? Hyperlipemia    ??? Hypertension    ??? 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 Other ill-defined conditions(799.89) 2016    Mild URI x 4 months (allergies)    ??? Radiation therapy complication 8185   ??? Routine Papanicolaou smear 1996    Negative per pt.    ??? 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 BREAST SURGERY PROCEDURE UNLISTED Right 2014    Right breast lumpectomy   ??? HX BREAST BIOPSY Left     x 2 negative biopsies    ??? HX CATARACT REMOVAL Bilateral     w/ IOL implants   ??? HX TAH AND BSO  1997    for rapidly growing fibroid (JW) - benign.  Dr. Oran Rein.   ??? HX TUBAL LIGATION  1972     Social History     Socioeconomic History   ??? Marital status: MARRIED  Spouse name: Not on file   ??? Number of children: Not on file   ??? Years of education: Not on file   ??? Highest education level: Not on file   Tobacco Use   ??? Smoking status: Former Smoker     Packs/day: 1.00     Years: 30.00     Pack years: 30.00     Last attempt to quit: 04/17/1996     Years since quitting: 21.8   ??? Smokeless tobacco: Never Used   ??? Tobacco comment: Never used vapor or e-cigs    Substance and Sexual Activity   ??? Alcohol use: Yes     Alcohol/week: 7.0 oz     Types: 14 Standard drinks or equivalent per week     Comment: 1 gin and tonics per day   ??? Drug use: No   ??? Sexual activity: Yes     Partners: Male     Birth control/protection: Surgical     Family History   Problem Relation Age of Onset   ??? Cancer Mother 80        Ovarian - Passed away in 29-Dec-1964   ??? Other Son 72        Pancreatitis    ??? Cancer Paternal Aunt 32        Pancreatic       Current Outpatient Medications   Medication Sig Dispense Refill   ??? valsartan-hydroCHLOROthiazide (DIOVAN-HCT) 80-12.5 mg per tablet TAKE 1 TABLET DAILY 90 Tab 3   ??? ergocalciferol (VITAMIN D2) 50,000 unit capsule TAKE 1 CAPSULE EVERY 7 DAYS 12 Cap 4   ??? anastrozole (ARIMIDEX) 1 mg tablet Take 1 mg by mouth daily. 90 Tab 1   ??? atorvastatin (LIPITOR) 40 mg tablet TAKE 1 TABLET DAILY BEFORE BREAKFAST 90 Tab 3    ??? alendronate (FOSAMAX) 35 mg tablet TAKE 1 TABLET EVERY 7 DAYS 12 Tab 3   ??? venlafaxine-SR (EFFEXOR-XR) 75 mg capsule Take 1 Cap by mouth daily. 90 Cap 2   ??? Cetirizine (ZYRTEC) 10 mg cap Take  by mouth as needed.     ??? krill-om-3-dha-epa-phospho-ast (MEGARED OMEGA-3 KRILL OIL) 1,000-230-60 mg cap Take 1 Tab by mouth two (2) times a day.     ??? brimonidine-timolol (COMBIGAN) 0.2-0.5 % drop ophthalmic solution Administer 1 Drop to both eyes every twelve (12) hours.     ??? aspirin 81 mg chewable tablet Take 81 mg by mouth daily.         Allergies   Allergen Reactions   ??? Shampoo & Body Wash [Skin Cleanser,General] Itching     Paul mitchell       Review of Systems    A comprehensive review of systems was performed and all systems were negative except for HPI and for the symptom report form, reviewed and scanned in.        Objective:  Physical Exam:  Visit Vitals  BP 132/61   Pulse 81   Temp 97.4 ??F (36.3 ??C) (Temporal)   Resp 20   Ht '5\' 3"'  (1.6 m)   Wt 172 lb 9.6 oz (78.3 kg)   SpO2 97%   BMI 30.57 kg/m??       General:  Alert, cooperative, no distress, appears stated age.   Head:  Normocephalic, without obvious abnormality, atraumatic.   Eyes:  Conjunctivae/corneas clear. PERRL, EOMs intact.   Throat: Lips, mucosa, and tongue normal.    Neck: Supple, symmetrical, trachea midline, no adenopathy, thyroid: no enlargement/tenderness/nodules   Back:  Symmetric, no curvature. ROM normal. No CVA tenderness.   Lungs:   Clear to auscultation bilaterally.       Heart:  Regular rate and rhythm, S1, S2 normal, no murmur, click, rub or gallop.   Abdomen:   Soft, non-tender. Bowel sounds normal. No masses,  No organomegaly.   Extremities: Extremities normal, atraumatic, no cyanosis or edema.   Skin: Skin color, texture, turgor normal. No rashes or lesions.   Lymph nodes: Cervical, supraclavicular, and axillary nodes normal.   Neurologic: CNII-XII intact.             Diagnostic Imaging    Results for orders placed during the hospital encounter of 08/09/17   XR WRIST RT AP/LAT/OBL MIN 3V    Narrative EXAM: XR WRIST RT AP/LAT/OBL MIN 3V    INDICATION:  Right wrist pain.    COMPARISON: None.    FINDINGS: 4  views of the right wrist demonstrate no fracture or other acute  osseous or articular abnormality. There is a tiny chronic ossicle adjacent to  the ulnar styloid. The soft tissues are within normal limits.      Impression IMPRESSION:  No acute abnormality.         No results found for this or any previous visit.    05/22/13 dexa  Lumbar spine: L1-4   Bone mineral density (gm/cm2): 1.325   % of peak bone mass: 111   % for age matched controls: 131   T score: 1.1   Z score: 2.6   Hip: Right femoral neck   Bone mineral density (gm/cm2): 0.927   % of peak bone mass: 89   % for age matched controls: 34   T score: -0.8   Z score: 0.8   IMPRESSION:   This patient is normal using the World Health Organization criteria   As compared to the prior study, there has been no significant change   10 year probability of major osteoporotic fracture: 8.7%   10 year probability of hip fracture: 0.9%    02/06/15 Bilateral Mammogram  Negative    06/08/17 dexa  Findings:  ????  Femoral Neck Left:  Bone mineral density (gm/cm2):  0.882  % of peak bone mass:  85  % for age matched controls:  111  T-score:  -1.1  Z-score:  0.6  ??  Femoral Neck Right:  Bone mineral density (gm/cm2):  0.875  % of peak bone mass:  84  % for age matched controls:  110  T-score:  -1.2  Z-score:  0.6  ????  Total Hip Left:  Bone mineral density (gm/cm2):  0.99  % of peak bone mass:  98  % for age matched controls:  122  T-score:  -0.1  Z-score:  1.4  ??  Total Hip Right:  Bone mineral density (gm/cm2):  0.971  % of peak bone mass:  96  % for age matched controls:  119  T-score:  -0.3  Z-score:  1.2  ??  Lumbar Spine:  L1-4   Bone mineral density (gm/cm2):  1.174  % of peak bone mass:  98  % for age matched controls:  115  T-score:  -0.2  Z-score:  1.3  ??   33% Radius Left:  Bone mineral density (gm/cm2):  0.682  % of peak bone mass:  96  % for age matched controls:  96  T-score:  -0.4  Z-score:  1.9  ??  IMPRESSION  Impression:  ????  This patient is osteopenic  using the World Health Organization criteria  The patient has had a prior study.  While no formal comparison is feasible, the  following observations are noted:  No change is observed.    02/16/17 bilat mammogram  negative    Lab Results  Lab Results   Component Value Date/Time    WBC 4.8 10/02/2015 09:55 AM    HGB 12.6 10/02/2015 09:55 AM    HCT 37.3 10/02/2015 09:55 AM    PLATELET 299 10/02/2015 09:55 AM    MCV 92 10/02/2015 09:55 AM       Lab Results   Component Value Date/Time    Sodium 145 (H) 11/21/2017 11:11 AM    Potassium 4.0 11/21/2017 11:11 AM    Chloride 103 11/21/2017 11:11 AM    CO2 25 11/21/2017 11:11 AM    Anion gap 6 04/17/2013 11:01 AM    Glucose 126 (H) 11/21/2017 11:11 AM    BUN 16 11/21/2017 11:11 AM    Creatinine 0.61 11/21/2017 11:11 AM    BUN/Creatinine ratio 26 11/21/2017 11:11 AM    GFR est AA 102 11/21/2017 11:11 AM    GFR est non-AA 88 11/21/2017 11:11 AM    Calcium 9.4 11/21/2017 11:11 AM    AST (SGOT) 14 11/21/2017 11:11 AM    Alk. phosphatase 61 11/21/2017 11:11 AM    Protein, total 6.9 11/21/2017 11:11 AM    Albumin 4.4 11/21/2017 11:11 AM    A-G Ratio 1.8 11/21/2017 11:11 AM    ALT (SGPT) 18 11/21/2017 11:11 AM       .    Assessment/Plan:  77 y.o. 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  PS 0    1. Breast cancer stage: IA    Hormonal therapy: administered    No evidence of recurrence, continue anastrozole.    Mammogram due May 2019, ordered but not scheduled yet.     2. Hot flashes:  stable, currently taking Effexor 75 mg daily in the PM.  Did not tolerate gabapentin due to oversleeping    3. Insomnia: drug induced, improved with effexor    4. Osteopenia:  Started fosamax 05/2015; taking vit D 50,000 int units  weekly, dexa is stable in 05/2017    5. Fatigue:  May be due to anastrozole, stable    6. Mild depression, major disorder:  Improved, on effexor    Thank you for this consult.  All of the patient's questions were answered today.      There are no Patient Instructions on file for this visit.   Follow-up and Dispositions    ?? Return in about 6 months (around 08/14/2018) for 6 mo fu, Kara Mierzejewski.         Sonda Rumble MD

## 2018-02-12 NOTE — Progress Notes (Signed)
Dawn Green is a 77 y.o. female here for follow-up of breast cancer.

## 2018-02-20 ENCOUNTER — Encounter: Attending: Family Medicine | Primary: Family Medicine

## 2018-03-01 ENCOUNTER — Inpatient Hospital Stay: Admit: 2018-03-01 | Payer: MEDICARE | Attending: Nurse Practitioner | Primary: Family Medicine

## 2018-03-01 ENCOUNTER — Encounter

## 2018-03-01 DIAGNOSIS — C50911 Malignant neoplasm of unspecified site of right female breast: Secondary | ICD-10-CM

## 2018-03-06 ENCOUNTER — Ambulatory Visit: Admit: 2018-03-06 | Discharge: 2018-03-06 | Payer: MEDICARE | Attending: Family Medicine | Primary: Family Medicine

## 2018-03-06 ENCOUNTER — Inpatient Hospital Stay: Admit: 2018-05-11 | Payer: MEDICARE | Primary: Family Medicine

## 2018-03-06 ENCOUNTER — Ambulatory Visit: Attending: Family Medicine | Primary: Family Medicine

## 2018-03-06 DIAGNOSIS — E119 Type 2 diabetes mellitus without complications: Secondary | ICD-10-CM

## 2018-03-06 MED ORDER — DICLOFENAC 75 MG TAB, DELAYED RELEASE
75 mg | ORAL_TABLET | Freq: Two times a day (BID) | ORAL | 2 refills | Status: DC | PRN
Start: 2018-03-06 — End: 2018-08-14

## 2018-03-06 NOTE — Progress Notes (Signed)
Chief Complaint   Patient presents with   . Follow-up   . Labs   . Back Pain   . Knee Pain     Patient presents in office today for 3 month f/u and fasting labs.  Has c/o lower back pain for about a month.  Also has c/o right knee pain for about 3 months.  Treating with motrin with some relief.  No other concerns.    1. Have you been to the ER, urgent care clinic since your last visit?  Hospitalized since your last visit?No    2. Have you seen or consulted any other health care providers outside of the [facility] since your last visit?  Include any pap smears or colon screening. No    Learning Assessment 05/06/2013   PRIMARY LEARNER Patient   HIGHEST LEVEL OF EDUCATION - PRIMARY LEARNER  SOME COLLEGE   BARRIERS PRIMARY LEARNER NONE   CO-LEARNER CAREGIVER No   PRIMARY LANGUAGE ENGLISH   INTERPRETER NEED No   LEARNER PREFERENCE PRIMARY READING     DEMONSTRATION     LISTENING   LEARNING SPECIAL TOPICS n/a   ANSWERED BY patient   RELATIONSHIP SELF

## 2018-03-06 NOTE — Progress Notes (Signed)
Dawn Green is a 77 y.o. female   Chief Complaint   Patient presents with   ??? Follow-up   ??? Labs   ??? Back Pain   ??? Knee Pain    pt states low back around waist line R>L and no radiation down legs.  States her R knee is also hurting.  Pt is using motrin 400mg  which does help some. Pt also with R knee pain and states the pain is thru her knee cap.  States she does a twisting with her leg when getting up on a stool to get into her bed.      Pt also with controlled type 2 diabetes and last check was 6.2  Her trigs are always quite elevated and ,make it difficult.  Pt is taking 500mg  of krill oil bid.      she is a 77 y.o. year old female who presents for evalution.      Reviewed PmHx, RxHx, FmHx, SocHx, AllgHx and updated and dated in the chart.    Review of Systems - negative except as listed above in the HPI    Objective:     Vitals:    03/06/18 0933   BP: 123/63   Pulse: 79   Resp: 16   Temp: 97.9 ??F (36.6 ??C)   TempSrc: Oral   SpO2: 94%   Weight: 171 lb (77.6 kg)   Height: 5\' 3"  (1.6 m)       Current Outpatient Medications   Medication Sig   ??? diclofenac EC (VOLTAREN) 75 mg EC tablet Take 1 Tab by mouth two (2) times daily as needed for Pain.   ??? valsartan-hydroCHLOROthiazide (DIOVAN-HCT) 80-12.5 mg per tablet TAKE 1 TABLET DAILY   ??? ergocalciferol (VITAMIN D2) 50,000 unit capsule TAKE 1 CAPSULE EVERY 7 DAYS   ??? anastrozole (ARIMIDEX) 1 mg tablet Take 1 mg by mouth daily.   ??? atorvastatin (LIPITOR) 40 mg tablet TAKE 1 TABLET DAILY BEFORE BREAKFAST   ??? alendronate (FOSAMAX) 35 mg tablet TAKE 1 TABLET EVERY 7 DAYS   ??? venlafaxine-SR (EFFEXOR-XR) 75 mg capsule Take 1 Cap by mouth daily.   ??? Cetirizine (ZYRTEC) 10 mg cap Take  by mouth as needed.   ??? krill-om-3-dha-epa-phospho-ast (MEGARED OMEGA-3 KRILL OIL) 1,000-230-60 mg cap Take 1 Tab by mouth two (2) times a day.   ??? brimonidine-timolol (COMBIGAN) 0.2-0.5 % drop ophthalmic solution Administer 1 Drop to both eyes every twelve (12) hours.   ??? aspirin 81 mg chewable  tablet Take 81 mg by mouth daily.     No current facility-administered medications for this visit.        Physical Examination: General appearance - alert, well appearing, and in no distress  Chest - clear to auscultation, no wheezes, rales or rhonchi, symmetric air entry  Heart - normal rate, regular rhythm, normal S1, S2, no murmurs, rubs, clicks or gallops  Musculoskeletal - L knee pain with ambulation no ttp, no pain with varus or valgus stress    ttp over b/l lumbar paraspinal  Assessment/ Plan:   Diagnoses and all orders for this visit:    1. Controlled type 2 diabetes mellitus without complication, without long-term current use of insulin (HCC)  -     HEMOGLOBIN A1C WITH EAG  -     LIPID CASCADE  -     METABOLIC PANEL, COMPREHENSIVE  -     MICROALBUMIN, UR, RAND W/ MICROALB/CREAT RATIO    2. Chronic pain of right knee  -  XR KNEE RT 3 V; Future    3. Chronic bilateral low back pain without sciatica  -     diclofenac EC (VOLTAREN) 75 mg EC tablet; Take 1 Tab by mouth two (2) times daily as needed for Pain.     stretching for back and given home exercises, if not helping would consider PT.  Follow-up and Dispositions    ?? Return if symptoms worsen or fail to improve.           I have discussed the diagnosis with the patient and the intended plan as seen in the above orders.  The patient has received an after-visit summary and questions were answered concerning future plans. Pt conveyed understanding of plan.    Medication Side Effects and Warnings were discussed with patient      Esau Grew, DO     This is the Subsequent Medicare Annual Wellness Exam, performed 12 months or more after the Initial AWV or the last Subsequent AWV    I have reviewed the patient's medical history in detail and updated the computerized patient record.     History     Past Medical History:   Diagnosis Date   ??? Breast CA ([place]) 2014    Right - Lumpectomy    ??? Diabetes ([place])    ??? Glaucoma    ??? Hx of mammogram 02/05/2016    Negative  per pt. Sees Dr. Laurena Bering    ??? Hyperlipemia    ??? Hypertension    ??? 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 Other ill-defined conditions(799.89) 2016    Mild URI x 4 months (allergies)    ??? Radiation therapy complication 2956   ??? Routine Papanicolaou smear 1996    Negative per pt.    ??? Skin cancer     Basal cell on left side of nose and upper lip      Past Surgical History:   Procedure Laterality Date   ??? BREAST SURGERY PROCEDURE UNLISTED Right 2014    Right breast lumpectomy   ??? HX BREAST BIOPSY Left     x 2 negative biopsies    ??? HX BREAST LUMPECTOMY Right 2014   ??? HX CATARACT REMOVAL Bilateral     w/ IOL implants   ??? HX TAH AND BSO  1997    for rapidly growing fibroid (JW) - benign.  Dr. Oran Rein.   ??? HX TUBAL LIGATION  1972     Current Outpatient Medications   Medication Sig Dispense Refill   ??? diclofenac EC (VOLTAREN) 75 mg EC tablet Take 1 Tab by mouth two (2) times daily as needed for Pain. 60 Tab 2   ??? valsartan-hydroCHLOROthiazide (DIOVAN-HCT) 80-12.5 mg per tablet TAKE 1 TABLET DAILY 90 Tab 3   ??? ergocalciferol (VITAMIN D2) 50,000 unit capsule TAKE 1 CAPSULE EVERY 7 DAYS 12 Cap 4   ??? anastrozole (ARIMIDEX) 1 mg tablet Take 1 mg by mouth daily. 90 Tab 1   ??? atorvastatin (LIPITOR) 40 mg tablet TAKE 1 TABLET DAILY BEFORE BREAKFAST 90 Tab 3   ??? alendronate (FOSAMAX) 35 mg tablet TAKE 1 TABLET EVERY 7 DAYS 12 Tab 3   ??? venlafaxine-SR (EFFEXOR-XR) 75 mg capsule Take 1 Cap by mouth daily. 90 Cap 2   ??? Cetirizine (ZYRTEC) 10 mg cap Take  by mouth as needed.     ??? krill-om-3-dha-epa-phospho-ast (MEGARED OMEGA-3 KRILL OIL) 1,000-230-60 mg cap Take 1 Tab by mouth two (2) times  a day.     ??? brimonidine-timolol (COMBIGAN) 0.2-0.5 % drop ophthalmic solution Administer 1 Drop to both eyes every twelve (12) hours.     ??? aspirin 81 mg chewable tablet Take 81 mg by mouth daily.       Allergies   Allergen Reactions   ??? Shampoo & Body Wash [Skin Cleanser,General] Itching     Paul mitchell     Family History    Problem Relation Age of Onset   ??? Cancer Mother 109        Ovarian - Passed away in December 31, 1964   ??? Ovarian Cancer Mother 13   ??? Other Son 72        Pancreatitis    ??? Cancer Paternal Aunt 30        Pancreatic     Social History     Tobacco Use   ??? Smoking status: Former Smoker     Packs/day: 1.00     Years: 30.00     Pack years: 30.00     Last attempt to quit: 04/17/1996     Years since quitting: 21.8   ??? Smokeless tobacco: Never Used   ??? Tobacco comment: Never used vapor or e-cigs    Substance Use Topics   ??? Alcohol use: Yes     Alcohol/week: 7.0 oz     Types: 14 Standard drinks or equivalent per week     Comment: 1 gin and tonics per day     Patient Active Problem List   Diagnosis Code   ??? Breast cancer, right ([place]) C50.911   ??? Postmenopausal Z78.0   ??? Hypovitaminosis D E55.9   ??? Essential hypertension I10   ??? Mixed hyperlipidemia E78.2   ??? Elevated fasting glucose R73.01   ??? Controlled type 2 diabetes mellitus without complication, without long-term current use of insulin (HCC) E11.9       Depression Risk Factor Screening:     3 most recent PHQ Screens 11/21/2017   PHQ Not Done -   Little interest or pleasure in doing things Not at all   Feeling down, depressed, irritable, or hopeless Not at all   Total Score PHQ 2 0     Alcohol Risk Factor Screening:   One drink daily    Functional Ability and Level of Safety:   Hearing Loss  Hearing is good.    Activities of Daily Living  The home contains: no safety equipment.  Patient does total self care    Fall Risk  Fall Risk Assessment, last 12 mths 11/21/2017   Able to walk? Yes   Fall in past 12 months? Yes   Fall with injury? Yes   Number of falls in past 12 months 1   Fall Risk Score 2       Abuse Screen  Patient is not abused    Cognitive Screening   Evaluation of Cognitive Function:  Has your family/caregiver stated any concerns about your memory: no  Normal    Patient Care Team   Patient Care Team:  Esau Grew, DO as PCP - General (Family Practice)  Joyce Gross, MD  (Hematology and Oncology)  Lyndel Safe, MD as Physician (Radiation Oncology)    Assessment/Plan   Education and counseling provided:  Are appropriate based on today's review and evaluation    Diagnoses and all orders for this visit:    1. Controlled type 2 diabetes mellitus without complication, without long-term current use of insulin ([place])  -  HEMOGLOBIN A1C WITH EAG  -     LIPID CASCADE  -     METABOLIC PANEL, COMPREHENSIVE  -     MICROALBUMIN, UR, RAND W/ MICROALB/CREAT RATIO    2. Chronic pain of right knee  -     XR KNEE RT 3 V; Future    3. Chronic bilateral low back pain without sciatica  -     diclofenac EC (VOLTAREN) 75 mg EC tablet; Take 1 Tab by mouth two (2) times daily as needed for Pain.    4. Medicare annual wellness visit, subsequent    5. Screening for alcoholism  -     PR ANNUAL ALCOHOL SCREEN 15 MIN        Health Maintenance Due   Topic Date Due   ??? Shingrix Vaccine Age 85> (1 of 2) 09/09/1991   ??? FOOT EXAM Q1  03/29/2017   ??? MEDICARE YEARLY EXAM  10/29/2017   ??? HEMOGLOBIN A1C Q6M  11/08/2017   ??? GLAUCOMA SCREENING Q2Y  03/04/2018     I have discussed the diagnosis with the patient and the intended plan as seen in the above orders.  The patient has received an after-visit summary and questions were answered concerning future plans. Pt conveyed understanding of plan.      Dr Esau Grew

## 2018-03-06 NOTE — Progress Notes (Signed)
Your triglycerides remain high but have improved ever so slightly.   Diabetes is stable.  Recheck labs 6 months

## 2018-03-06 NOTE — Patient Instructions (Addendum)
Low Back Pain: Exercises  Your Care Instructions  Here are some examples of typical rehabilitation exercises for your condition. Start each exercise slowly. Ease off the exercise if you start to have pain.  Your doctor or physical therapist will tell you when you can start these exercises and which ones will work best for you.  How to do the exercises  Press-up    1. Lie on your stomach, supporting your body with your forearms.  2. Press your elbows down into the floor to raise your upper back. As you do this, relax your stomach muscles and allow your back to arch without using your back muscles. As your press up, do not let your hips or pelvis come off the floor.  3. Hold for 15 to 30 seconds, then relax.  4. Repeat 2 to 4 times.    Alternate arm and leg (bird dog) exercise    1. Start on the floor, on your hands and knees.  2. Tighten your belly muscles.  3. Raise one leg off the floor, and hold it straight out behind you. Be careful not to let your hip drop down, because that will twist your trunk.  4. Hold for about 6 seconds, then lower your leg and switch to the other leg.  5. Repeat 8 to 12 times on each leg.  6. Over time, work up to holding for 10 to 30 seconds each time.  7. If you feel stable and secure with your leg raised, try raising the opposite arm straight out in front of you at the same time.    Knee-to-chest exercise    1. Lie on your back with your knees bent and your feet flat on the floor.  2. Bring one knee to your chest, keeping the other foot flat on the floor (or keeping the other leg straight, whichever feels better on your lower back).  3. Keep your lower back pressed to the floor. Hold for at least 15 to 30 seconds.  4. Relax, and lower the knee to the starting position.  5. Repeat with the other leg. Repeat 2 to 4 times with each leg.  6. To get more stretch, put your other leg flat on the floor while pulling your knee to your chest.    Curl-ups     1. Lie on the floor on your back with your knees bent at a 90-degree angle. Your feet should be flat on the floor, about 12 inches from your buttocks.  2. Cross your arms over your chest. If this bothers your neck, try putting your hands behind your neck (not your head), with your elbows spread apart.  3. Slowly tighten your belly muscles and raise your shoulder blades off the floor.  4. Keep your head in line with your body, and do not press your chin to your chest.  5. Hold this position for 1 or 2 seconds, then slowly lower yourself back down to the floor.  6. Repeat 8 to 12 times.    Pelvic tilt exercise    1. Lie on your back with your knees bent.  2. "Brace" your stomach. This means to tighten your muscles by pulling in and imagining your belly button moving toward your spine. You should feel like your back is pressing to the floor and your hips and pelvis are rocking back.  3. Hold for about 6 seconds while you breathe smoothly.  4. Repeat 8 to 12 times.    Heel dig bridging      1. Lie on your back with both knees bent and your ankles bent so that only your heels are digging into the floor. Your knees should be bent about 90 degrees.  2. Then push your heels into the floor, squeeze your buttocks, and lift your hips off the floor until your shoulders, hips, and knees are all in a straight line.  3. Hold for about 6 seconds as you continue to breathe normally, and then slowly lower your hips back down to the floor and rest for up to 10 seconds.  4. Do 8 to 12 repetitions.    Hamstring stretch in doorway    1. Lie on your back in a doorway, with one leg through the open door.  2. Slide your leg up the wall to straighten your knee. You should feel a gentle stretch down the back of your leg.  3. Hold the stretch for at least 15 to 30 seconds. Do not arch your back, point your toes, or bend either knee. Keep one heel touching the floor and the other heel touching the wall.  4. Repeat with your other leg.   5. Do 2 to 4 times for each leg.    Hip flexor stretch    1. Kneel on the floor with one knee bent and one leg behind you. Place your forward knee over your foot. Keep your other knee touching the floor.  2. Slowly push your hips forward until you feel a stretch in the upper thigh of your rear leg.  3. Hold the stretch for at least 15 to 30 seconds. Repeat with your other leg.  4. Do 2 to 4 times on each side.    Wall sit    1. Stand with your back 10 to 12 inches away from a wall.  2. Lean into the wall until your back is flat against it.  3. Slowly slide down until your knees are slightly bent, pressing your lower back into the wall.  4. Hold for about 6 seconds, then slide back up the wall.  5. Repeat 8 to 12 times.    Follow-up care is a key part of your treatment and safety. Be sure to make and go to all appointments, and call your doctor if you are having problems. It's also a good idea to know your test results and keep a list of the medicines you take.  Where can you learn more?  Go to [email or web address].  Enter [phone] in the search box to learn more about "Low Back Pain: Exercises."  Current as of: July 06, 2017  Content Version: 11.9  ?? 2006-2018 [employer]. Care instructions adapted under license by [employer] (which disclaims liability or warranty for this information). If you have questions about a medical condition or this instruction, always ask your healthcare professional. [place] any warranty or liability for your use of this information.        Medicare Wellness Visit, Female     The best way to live healthy is to have a lifestyle where you eat a well-balanced diet, exercise regularly, limit alcohol use, and quit all forms of tobacco/nicotine, if applicable.   Regular preventive services are another way to keep healthy. Preventive services (vaccines, screening tests, monitoring & exams) can help  personalize your care plan, which helps you manage your own care. Screening tests can find health problems at the earliest stages, when they are easiest to treat.   [facility] follows the current, evidence-based  guidelines published by the [place] (USPSTF) when recommending preventive services for our patients. Because we follow these guidelines, sometimes recommendations change over time as research supports it. (For example, mammograms used to be recommended annually. Even though Medicare will still pay for an annual mammogram, the newer guidelines recommend a mammogram every two years for women of average risk.)  Of course, you and your doctor may decide to screen more often for some diseases, based on your risk and your health status.   Preventive services for you include:  - Medicare offers their members a free annual wellness visit, which is time for you and your primary care provider to discuss and plan for your preventive service needs. Take advantage of this benefit every year!  -All adults over the age of 88 should receive the recommended pneumonia vaccines. Current USPSTF guidelines recommend a series of two vaccines for the best pneumonia protection.   -All adults should have a flu vaccine yearly and a tetanus vaccine every 10 years. All adults age 1 and older should receive a shingles vaccine once in their lifetime.    -A bone mass density test is recommended when a woman turns 65 to screen for osteoporosis. This test is only recommended one time, as a screening. Some providers will use this same test as a disease monitoring tool if you already have osteoporosis.  -All adults age 46-70 who are overweight should have a diabetes screening test once every three years.   -Other screening tests and preventive services for persons with diabetes include: an eye exam to screen for diabetic retinopathy, a kidney function  test, a foot exam, and stricter control over your cholesterol.   -Cardiovascular screening for adults with routine risk involves an electrocardiogram (ECG) at intervals determined by your doctor.   -Colorectal cancer screenings should be done for adults age 2-75 with no increased risk factors for colorectal cancer.  There are a number of acceptable methods of screening for this type of cancer. Each test has its own benefits and drawbacks. Discuss with your doctor what is most appropriate for you during your annual wellness visit. The different tests include: colonoscopy (considered the best screening method), a fecal occult blood test, a fecal DNA test, and sigmoidoscopy.  -Breast cancer screenings are recommended every other year for women of normal risk, age 65-74.  -Cervical cancer screenings for women over age 58 are only recommended with certain risk factors.   -All adults born between [place] should be screened once for Hepatitis C.     Here is a list of your current Health Maintenance items (your personalized list of preventive services) with a due date:  Health Maintenance Due   Topic Date Due   ??? Shingles Vaccine (1 of 2) 09/09/1991   ??? Diabetic Foot Care  03/29/2017   ??? Annual Well Visit  10/29/2017   ??? Hemoglobin A1C    11/08/2017   ??? Glaucoma Screening   03/04/2018

## 2018-03-06 NOTE — Progress Notes (Signed)
[facility] is a 77 y.o. female   Chief Complaint   Patient presents with   ??? Follow-up   ??? Labs   ??? Back Pain   ??? Knee Pain    pt states low back around waist line R>L and no radiation down legs.  States her R knee is also hurting.  Pt is using motrin 400mg  which does help some. Pt also with R knee pain and states the pain is thru her knee cap.  States she does a twisting with her leg when getting up on a stool to get into her bed.      Pt also with controlled type 2 diabetes and last check was 6.2  Her trigs are always quite elevated and ,make it difficult.  Pt is taking 500mg  of krill oil bid.      she is a 77 y.o. year old female who presents for evalution.      Reviewed PmHx, RxHx, FmHx, SocHx, AllgHx and updated and dated in the chart.    Review of Systems - negative except as listed above in the HPI    Objective:     Vitals:    03/06/18 0933   BP: 123/63   Pulse: 79   Resp: 16   Temp: 97.9 ??F (36.6 ??C)   TempSrc: Oral   SpO2: 94%   Weight: 171 lb (77.6 kg)   Height: 5\' 3"  (1.6 m)       Current Outpatient Medications   Medication Sig   ??? diclofenac EC (VOLTAREN) 75 mg EC tablet Take 1 Tab by mouth two (2) times daily as needed for Pain.   ??? valsartan-hydroCHLOROthiazide (DIOVAN-HCT) 80-12.5 mg per tablet TAKE 1 TABLET DAILY   ??? ergocalciferol (VITAMIN D2) 50,000 unit capsule TAKE 1 CAPSULE EVERY 7 DAYS   ??? anastrozole (ARIMIDEX) 1 mg tablet Take 1 mg by mouth daily.   ??? atorvastatin (LIPITOR) 40 mg tablet TAKE 1 TABLET DAILY BEFORE BREAKFAST   ??? alendronate (FOSAMAX) 35 mg tablet TAKE 1 TABLET EVERY 7 DAYS   ??? venlafaxine-SR (EFFEXOR-XR) 75 mg capsule Take 1 Cap by mouth daily.   ??? Cetirizine (ZYRTEC) 10 mg cap Take  by mouth as needed.   ??? krill-om-3-dha-epa-phospho-ast (MEGARED OMEGA-3 KRILL OIL) 1,000-230-60 mg cap Take 1 Tab by mouth two (2) times a day.   ??? brimonidine-timolol (COMBIGAN) 0.2-0.5 % drop ophthalmic solution Administer 1 Drop to both eyes every twelve (12) hours.    ??? aspirin 81 mg chewable tablet Take 81 mg by mouth daily.     No current facility-administered medications for this visit.        Physical Examination: General appearance - alert, well appearing, and in no distress  Chest - clear to auscultation, no wheezes, rales or rhonchi, symmetric air entry  Heart - normal rate, regular rhythm, normal S1, S2, no murmurs, rubs, clicks or gallops  Musculoskeletal - L knee pain with ambulation no ttp, no pain with varus or valgus stress    ttp over b/l lumbar paraspinal  Assessment/ Plan:   Diagnoses and all orders for this visit:    1. Controlled type 2 diabetes mellitus without complication, without long-term current use of insulin (HCC)  -     HEMOGLOBIN A1C WITH EAG  -     LIPID CASCADE  -     METABOLIC PANEL, COMPREHENSIVE  -     MICROALBUMIN, UR, RAND W/ MICROALB/CREAT RATIO    2. Chronic pain of right knee  -  XR KNEE RT 3 V; Future    3. Chronic bilateral low back pain without sciatica  -     diclofenac EC (VOLTAREN) 75 mg EC tablet; Take 1 Tab by mouth two (2) times daily as needed for Pain.     stretching for back and given home exercises, if not helping would consider PT.  Follow-up and Dispositions    ?? Return if symptoms worsen or fail to improve.           I have discussed the diagnosis with the patient and the intended plan as seen in the above orders.  The patient has received an after-visit summary and questions were answered concerning future plans. Pt conveyed understanding of plan.    Medication Side Effects and Warnings were discussed with patient      Esau Grew, DO     This is the Subsequent Medicare Annual Wellness Exam, performed 12 months or more after the Initial AWV or the last Subsequent AWV    I have reviewed the patient's medical history in detail and updated the computerized patient record.     History     Past Medical History:   Diagnosis Date   ??? Breast CA ([place]) 2014    Right - Lumpectomy    ??? Diabetes ([place])    ??? Glaucoma     ??? Hx of mammogram 02/05/2016    Negative per pt. Sees Dr. Laurena Bering    ??? Hyperlipemia    ??? Hypertension    ??? 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 Other ill-defined conditions(799.89) 2016    Mild URI x 4 months (allergies)    ??? Radiation therapy complication 4034   ??? Routine Papanicolaou smear 1996    Negative per pt.    ??? Skin cancer     Basal cell on left side of nose and upper lip      Past Surgical History:   Procedure Laterality Date   ??? BREAST SURGERY PROCEDURE UNLISTED Right 2014    Right breast lumpectomy   ??? HX BREAST BIOPSY Left     x 2 negative biopsies    ??? HX BREAST LUMPECTOMY Right 2014   ??? HX CATARACT REMOVAL Bilateral     w/ IOL implants   ??? HX TAH AND BSO  1997    for rapidly growing fibroid (JW) - benign.  Dr. Oran Rein.   ??? HX TUBAL LIGATION  1972     Current Outpatient Medications   Medication Sig Dispense Refill   ??? diclofenac EC (VOLTAREN) 75 mg EC tablet Take 1 Tab by mouth two (2) times daily as needed for Pain. 60 Tab 2   ??? valsartan-hydroCHLOROthiazide (DIOVAN-HCT) 80-12.5 mg per tablet TAKE 1 TABLET DAILY 90 Tab 3   ??? ergocalciferol (VITAMIN D2) 50,000 unit capsule TAKE 1 CAPSULE EVERY 7 DAYS 12 Cap 4   ??? anastrozole (ARIMIDEX) 1 mg tablet Take 1 mg by mouth daily. 90 Tab 1   ??? atorvastatin (LIPITOR) 40 mg tablet TAKE 1 TABLET DAILY BEFORE BREAKFAST 90 Tab 3   ??? alendronate (FOSAMAX) 35 mg tablet TAKE 1 TABLET EVERY 7 DAYS 12 Tab 3   ??? venlafaxine-SR (EFFEXOR-XR) 75 mg capsule Take 1 Cap by mouth daily. 90 Cap 2   ??? Cetirizine (ZYRTEC) 10 mg cap Take  by mouth as needed.     ??? krill-om-3-dha-epa-phospho-ast (MEGARED OMEGA-3 KRILL OIL) 1,000-230-60 mg cap Take 1 Tab by mouth two (2) times  a day.     ??? brimonidine-timolol (COMBIGAN) 0.2-0.5 % drop ophthalmic solution Administer 1 Drop to both eyes every twelve (12) hours.     ??? aspirin 81 mg chewable tablet Take 81 mg by mouth daily.       Allergies   Allergen Reactions   ??? Shampoo & Body Wash [Skin Cleanser,General] Itching      Paul mitchell     Family History   Problem Relation Age of Onset   ??? Cancer Mother 15        Ovarian - Passed away in 1965/01/11   ??? Ovarian Cancer Mother 77   ??? Other Son 65        Pancreatitis    ??? Cancer Paternal Aunt 5        Pancreatic     Social History     Tobacco Use   ??? Smoking status: Former Smoker     Packs/day: 1.00     Years: 30.00     Pack years: 30.00     Last attempt to quit: 04/17/1996     Years since quitting: 21.8   ??? Smokeless tobacco: Never Used   ??? Tobacco comment: Never used vapor or e-cigs    Substance Use Topics   ??? Alcohol use: Yes     Alcohol/week: 7.0 oz     Types: 14 Standard drinks or equivalent per week     Comment: 1 gin and tonics per day     Patient Active Problem List   Diagnosis Code   ??? Breast cancer, right ([place]) C50.911   ??? Postmenopausal Z78.0   ??? Hypovitaminosis D E55.9   ??? Essential hypertension I10   ??? Mixed hyperlipidemia E78.2   ??? Elevated fasting glucose R73.01   ??? Controlled type 2 diabetes mellitus without complication, without long-term current use of insulin (HCC) E11.9       Depression Risk Factor Screening:     3 most recent PHQ Screens 11/21/2017   PHQ Not Done -   Little interest or pleasure in doing things Not at all   Feeling down, depressed, irritable, or hopeless Not at all   Total Score PHQ 2 0     Alcohol Risk Factor Screening:   One drink daily    Functional Ability and Level of Safety:   Hearing Loss  Hearing is good.    Activities of Daily Living  The home contains: no safety equipment.  Patient does total self care    Fall Risk  Fall Risk Assessment, last 12 mths 11/21/2017   Able to walk? Yes   Fall in past 12 months? Yes   Fall with injury? Yes   Number of falls in past 12 months 1   Fall Risk Score 2       Abuse Screen  Patient is not abused    Cognitive Screening   Evaluation of Cognitive Function:  Has your family/caregiver stated any concerns about your memory: no  Normal    Patient Care Team   Patient Care Team:   Esau Grew, DO as PCP - General (Family Practice)  Joyce Gross, MD (Hematology and Oncology)  Lyndel Safe, MD as Physician (Radiation Oncology)    Assessment/Plan   Education and counseling provided:  Are appropriate based on today's review and evaluation    Diagnoses and all orders for this visit:    1. Controlled type 2 diabetes mellitus without complication, without long-term current use of insulin ([place])  -  HEMOGLOBIN A1C WITH EAG  -     LIPID CASCADE  -     METABOLIC PANEL, COMPREHENSIVE  -     MICROALBUMIN, UR, RAND W/ MICROALB/CREAT RATIO    2. Chronic pain of right knee  -     XR KNEE RT 3 V; Future    3. Chronic bilateral low back pain without sciatica  -     diclofenac EC (VOLTAREN) 75 mg EC tablet; Take 1 Tab by mouth two (2) times daily as needed for Pain.    4. Medicare annual wellness visit, subsequent    5. Screening for alcoholism  -     PR ANNUAL ALCOHOL SCREEN 15 MIN        Health Maintenance Due   Topic Date Due   ??? Shingrix Vaccine Age 66> (1 of 2) 09/09/1991   ??? FOOT EXAM Q1  03/29/2017   ??? MEDICARE YEARLY EXAM  10/29/2017   ??? HEMOGLOBIN A1C Q6M  11/08/2017   ??? GLAUCOMA SCREENING Q2Y  03/04/2018     I have discussed the diagnosis with the patient and the intended plan as seen in the above orders.  The patient has received an after-visit summary and questions were answered concerning future plans. Pt conveyed understanding of plan.      Dr Esau Grew

## 2018-03-06 NOTE — Progress Notes (Signed)
Chief Complaint   Patient presents with   ??? Follow-up   ??? Labs   ??? Back Pain   ??? Knee Pain     Patient presents in office today for 3 month f/u and fasting labs.  Has c/o lower back pain for about a month.  Also has c/o right knee pain for about 3 months.  Treating with motrin with some relief.  No other concerns.    1. Have you been to the ER, urgent care clinic since your last visit?  Hospitalized since your last visit?No    2. Have you seen or consulted any other health care providers outside of the [facility] since your last visit?  Include any pap smears or colon screening. No    Learning Assessment 05/06/2013   PRIMARY LEARNER Patient   HIGHEST LEVEL OF EDUCATION - PRIMARY LEARNER  SOME COLLEGE   BARRIERS PRIMARY LEARNER NONE   CO-LEARNER CAREGIVER No   PRIMARY LANGUAGE ENGLISH   INTERPRETER NEED No   LEARNER PREFERENCE PRIMARY READING     DEMONSTRATION     LISTENING   LEARNING SPECIAL TOPICS n/a   ANSWERED BY patient   RELATIONSHIP SELF

## 2018-03-08 LAB — COMPREHENSIVE METABOLIC PANEL
ALT: 18 IU/L (ref 0–32)
AST: 16 IU/L (ref 0–40)
Albumin/Globulin Ratio: 1.8 NA (ref 1.2–2.2)
Albumin: 4.1 g/dL (ref 3.5–4.8)
Alkaline Phosphatase: 68 IU/L (ref 39–117)
BUN: 18 mg/dL (ref 8–27)
Bun/Cre Ratio: 27 NA (ref 12–28)
CO2: 26 mmol/L (ref 20–29)
Calcium: 9.1 mg/dL (ref 8.7–10.3)
Chloride: 101 mmol/L (ref 96–106)
Creatinine: 0.67 mg/dL (ref 0.57–1.00)
EGFR IF NonAfrican American: 86 mL/min/{1.73_m2} (ref >59)
GFR African American: 99 mL/min/{1.73_m2} (ref >59)
Globulin, Total: 2.3 g/dL (ref 1.5–4.5)
Glucose: 148 mg/dL — ABNORMAL HIGH (ref 65–99)
Potassium: 3.8 mmol/L (ref 3.5–5.2)
Sodium: 143 mmol/L (ref 134–144)
Total Bilirubin: 0.4 mg/dL (ref 0.0–1.2)
Total Protein: 6.4 g/dL (ref 6.0–8.5)

## 2018-03-08 LAB — MICROALBUMIN / CREATININE URINE RATIO

## 2018-03-08 LAB — HEMOGLOBIN A1C W/EAG
Hemoglobin A1C: 6.3 % — ABNORMAL HIGH (ref 4.8–5.6)
eAG: 134 mg/dL

## 2018-03-08 LAB — CVD REPORT

## 2018-03-08 LAB — LIPOPROTEIN ANALYSIS, BY NMR
HDL-P (Total): 27.7 umol/L — ABNORMAL LOW (ref >=30.5)
LDL size: 19.6 nm — ABNORMAL LOW (ref >20.5)
LDL-P: 619 nmol/L (ref <1000)
LP-IR SCORE: 85 — ABNORMAL HIGH (ref <=45)
Small LDL-P: 508 nmol/L (ref <=527)

## 2018-03-08 LAB — METABOLIC PANEL, COMPREHENSIVE
A-G Ratio: 1.8 (ref 1.2–2.2)
ALT (SGPT): 18 IU/L (ref 0–32)
AST (SGOT): 16 IU/L (ref 0–40)
Albumin: 4.1 g/dL (ref 3.5–4.8)
Alk. phosphatase: 68 IU/L (ref 39–117)
BUN/Creatinine ratio: 27 (ref 12–28)
BUN: 18 mg/dL (ref 8–27)
Bilirubin, total: 0.4 mg/dL (ref 0.0–1.2)
CO2: 26 mmol/L (ref 20–29)
Calcium: 9.1 mg/dL (ref 8.7–10.3)
Chloride: 101 mmol/L (ref 96–106)
Creatinine: 0.67 mg/dL (ref 0.57–1.00)
GFR est AA: 99 mL/min/{1.73_m2} (ref >59)
GFR est non-AA: 86 mL/min/{1.73_m2} (ref >59)
GLOBULIN, TOTAL: 2.3 g/dL (ref 1.5–4.5)
Glucose: 148 mg/dL — ABNORMAL HIGH (ref 65–99)
Potassium: 3.8 mmol/L (ref 3.5–5.2)
Protein, total: 6.4 g/dL (ref 6.0–8.5)
Sodium: 143 mmol/L (ref 134–144)

## 2018-03-08 LAB — MICROALBUMIN, UR, RAND W/ MICROALB/CREAT RATIO

## 2018-03-08 LAB — HEMOGLOBIN A1C WITH EAG
Estimated average glucose: 134 mg/dL
Hemoglobin A1c: 6.3 % — ABNORMAL HIGH (ref 4.8–5.6)

## 2018-03-08 LAB — LIPID CASCADE
Cholesterol, total: 144 mg/dL (ref 100–199)
HDL Cholesterol: 32 mg/dL — ABNORMAL LOW (ref >39)
LDL, calculated: 32 mg/dL (ref 0–99)
LDL/HDL Ratio: 1 ratio (ref 0.0–3.2)
Non-HDL Cholesterol: 112 mg/dL (ref 0–129)
Triglyceride: 398 mg/dL — ABNORMAL HIGH (ref 0–149)

## 2018-03-08 LAB — DIABETES PATIENT EDUCATION

## 2018-03-13 ENCOUNTER — Inpatient Hospital Stay: Payer: Medicare Other | Attending: Gynecologic Oncology

## 2018-03-13 DIAGNOSIS — C5702 Malignant neoplasm of left fallopian tube: Secondary | ICD-10-CM

## 2018-03-13 DIAGNOSIS — Z452 Encounter for adjustment and management of vascular access device: Secondary | ICD-10-CM | POA: Insufficient documentation

## 2018-03-13 DIAGNOSIS — Z95828 Presence of other vascular implants and grafts: Secondary | ICD-10-CM

## 2018-03-13 MED ORDER — SODIUM CHLORIDE 0.9% FLUSH
10.0000 mL | Freq: Once | INTRAVENOUS | Status: AC
  Administered 2018-03-13: 10 mL
  Filled 2018-03-13: qty 10

## 2018-03-13 MED ORDER — HEPARIN SOD (PORK) LOCK FLUSH 100 UNIT/ML IV SOLN
500.0000 [IU] | Freq: Once | INTRAVENOUS | Status: AC
  Administered 2018-03-13: 500 [IU]
  Filled 2018-03-13: qty 5

## 2018-03-16 MED ORDER — DEXAMETHASONE SODIUM PHOSPHATE 10 MG/ML IJ SOLN
INTRAMUSCULAR | Status: AC
  Filled 2018-03-16: qty 1

## 2018-03-16 MED ORDER — PALONOSETRON HCL INJECTION 0.25 MG/5ML
INTRAVENOUS | Status: AC
  Filled 2018-03-16: qty 5

## 2018-03-19 ENCOUNTER — Telehealth: Payer: Self-pay | Admitting: *Deleted

## 2018-03-19 ENCOUNTER — Other Ambulatory Visit: Payer: Self-pay | Admitting: Gynecologic Oncology

## 2018-03-19 DIAGNOSIS — C5700 Malignant neoplasm of unspecified fallopian tube: Secondary | ICD-10-CM

## 2018-03-19 MED ORDER — PEGFILGRASTIM-CBQV 6 MG/0.6ML ~~LOC~~ SOSY
PREFILLED_SYRINGE | SUBCUTANEOUS | Status: AC
Start: 2018-03-19 — End: ?
  Filled 2018-03-19: qty 0.6

## 2018-03-19 NOTE — Telephone Encounter (Signed)
Returned the patient's call and left a message on the machine. The message left had the patient's appt date/time

## 2018-03-20 ENCOUNTER — Inpatient Hospital Stay: Payer: Medicare Other | Attending: Gynecologic Oncology

## 2018-03-20 DIAGNOSIS — C5700 Malignant neoplasm of unspecified fallopian tube: Secondary | ICD-10-CM

## 2018-03-20 DIAGNOSIS — C569 Malignant neoplasm of unspecified ovary: Secondary | ICD-10-CM | POA: Insufficient documentation

## 2018-03-20 DIAGNOSIS — Z85820 Personal history of malignant melanoma of skin: Secondary | ICD-10-CM | POA: Insufficient documentation

## 2018-03-20 DIAGNOSIS — Z90710 Acquired absence of both cervix and uterus: Secondary | ICD-10-CM | POA: Diagnosis not present

## 2018-03-20 DIAGNOSIS — Z9221 Personal history of antineoplastic chemotherapy: Secondary | ICD-10-CM | POA: Insufficient documentation

## 2018-03-20 DIAGNOSIS — R971 Elevated cancer antigen 125 [CA 125]: Secondary | ICD-10-CM | POA: Diagnosis not present

## 2018-03-21 LAB — CA 125: CANCER ANTIGEN (CA) 125: 73 U/mL — AB (ref 0.0–38.1)

## 2018-03-22 ENCOUNTER — Telehealth: Payer: Self-pay | Admitting: *Deleted

## 2018-03-22 ENCOUNTER — Inpatient Hospital Stay: Payer: Medicare Other | Admitting: Gynecologic Oncology

## 2018-03-22 NOTE — Telephone Encounter (Signed)
Patient's son called and canceled her appt for today. He stated that "My brother had some complications after his heart surgery yesterday and my mom just can't take any more stress. My dad asked me to call to cancel the appt and try to find out the lab results. We know the number was creeping up, my dad and I will not share the result with her, she can't take anything else right now.We knew today would be to stressful for her after yesterday. She had a scan done too." They will call back next week to reschedule.

## 2018-03-26 ENCOUNTER — Telehealth: Payer: Self-pay | Admitting: *Deleted

## 2018-03-26 NOTE — Telephone Encounter (Signed)
Patient called and rescheduled her missed appt from last week to tomorrow

## 2018-03-27 ENCOUNTER — Encounter: Payer: Self-pay | Admitting: Gynecologic Oncology

## 2018-03-27 ENCOUNTER — Inpatient Hospital Stay (HOSPITAL_BASED_OUTPATIENT_CLINIC_OR_DEPARTMENT_OTHER): Payer: Medicare Other | Admitting: Gynecologic Oncology

## 2018-03-27 VITALS — BP 126/69 | HR 75 | Temp 97.6°F | Resp 20 | Ht 63.0 in | Wt 152.8 lb

## 2018-03-27 DIAGNOSIS — Z90710 Acquired absence of both cervix and uterus: Secondary | ICD-10-CM | POA: Diagnosis not present

## 2018-03-27 DIAGNOSIS — Z9221 Personal history of antineoplastic chemotherapy: Secondary | ICD-10-CM

## 2018-03-27 DIAGNOSIS — Z85820 Personal history of malignant melanoma of skin: Secondary | ICD-10-CM

## 2018-03-27 DIAGNOSIS — R971 Elevated cancer antigen 125 [CA 125]: Secondary | ICD-10-CM | POA: Diagnosis not present

## 2018-03-27 DIAGNOSIS — C569 Malignant neoplasm of unspecified ovary: Secondary | ICD-10-CM

## 2018-03-27 DIAGNOSIS — Z90722 Acquired absence of ovaries, bilateral: Secondary | ICD-10-CM | POA: Diagnosis not present

## 2018-03-27 NOTE — Patient Instructions (Signed)
Please return to see Dr Denman George in July, however, call her office at [phone] at any point if you have concerns or would like to be seen sooner.

## 2018-03-27 NOTE — Progress Notes (Signed)
Follow-up Note: Gyn-Onc  Consult was initially requested by Dr. Toney Rakes for the evaluation of [name] 77 y.o. female  CC:  Chief Complaint  Patient presents with  . Malignant neoplasm of ovary, unspecified laterality [facility])    Assessment/Plan:  Ms. BRANDIN DILDAY  is a 77 y.o.  year old with stage IIIC left fallopian tube cancer, s/p exploratory laparotomy, TAH, BSO, omentectomy, optimal cytoreductive surgery on 02/18/16 s/p 6 cycles adjuvant carboplatin and paclitaxel completed 07/15/16. BRCA negative. Elevated CA 125 suggesting recurrent disease (recurrence in June, 2019).   Asymptomatic.  Options for therapy would be expectant management until symptomatic vs commencing platinum based doublet therapy (carb/tax vs carb/doxil followed by niraparib maintenance). I discussed that average life expectancy is equivalent for both options.   Asymptomatic umbilical incisional hernia. Declines surgical referral.  Maely is electing for expectant management at this time. We will see her again in July and determine if she would like to start therapy based on symptoms.   HPI: Keimani Laufer is a 77 year old para's woman who is seen in consultation at the request of Dr. Toney Rakes for abdominal peritoneal carcinomatosis and omental caking. The patient has a known history of a 3 cm left ovarian cyst for which she saw my partner approximately 15 months ago. At that time her CA-125 is mildly elevated to 49 in her Roma 1 score was also elevated. However the left ovarian cyst was unilocular, measured only 3 cm, and was long-standing, and therefore suspicion for occult malignancy was low.  In November 2016 she began feeling central and upper abdominal discomfort. Initially she thought it was part of the grieving process she lost her grandson. However when the pain and discomfort persisted she sought evaluation by her providers. A gynecologic evaluation was unremarkable. However eventually she underwent CT scan  imaging on 01/27/2016 for this persistent midabdominal pain. CT imaging showed a liver containing a few small low-density lesions which were equivocal for the potential of metastatic disease these were subcentimeter in dimension. There was an extensive omental nodularity which was highly worrisome for peritoneal carcinomatosis. A 3.5 cm stable left adnexal simple cyst was identified. This was not typical for ovarian cancer. There were no other obvious signs of metastatic disease including no gross ascites.  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 She is not required lymphadenectomy for any of these melanoma lesions. She is no family history significant for breast or ovarian cancer.   On 02/18/16 she underwent an exploratory laparotomy, TAH, BSO, omentectomy, radical tumor debulking and argon beam coagulation of milliary tumor implants. Disease was predominantly in the omentum with milliary studding of the ovaries and peritoneal surfaces. There was a right ovarian cyst measuring 3cm. There was no gross residual disease at completion of surgery with exception of the residual nodules that had received argon beam coagulation.  Postoperatively she did well with no complications.  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 The right ovarian cyst (that had been present in 2015) was benign.   She completed 6 cycles of adjuvant chemotherapy with carboplatin and paclitaxel on 07/15/16.   CA 125 was stable and normal at 17.2 on 08/08/16. CT abdo/pelvis on 08/05/16 showed no evidence for persistent disease.  CA 125 was stable and normal at 13.6 on 11/04/16, and 13.8 on 01/20/17.  Her sister was diagnosed with ovarian cancer in December, 2017. Enslie is  BRCA negative. Her sister is as yet untested.   CA 125 on 04/13/17 was normal at 16.1.  CT abdo/pelvis on 06/20/17 showed a small supraumbilical hernia,  but no other abnormalities and no evidence of metastatic or recurrent disease. CA 125 was normal at 18.4 in September, 2018. Repeat CA 125 was normal and stable at 18.5 on 09/18/17.   Mitchelle reports some mild abdominal bloating. She denies pain. Her hernia is mildly symptomatic. She does not have symptoms of bowel obstruction.  CA 125 on 01/23/18 had increased from 18 to 35.4 suggestive of recurrence.  CT abd/pelvis on January 30, 2018 revealed no evidence of recurrence within the pelvis, no evidence of metastatic peritoneal disease or omental disease.  No solid organ metastases.  Arnika traveled to [place] for a vacation and was doing well with no symptoms other than vague abdominal discomfort that she was unclear was related to either cancer recurrence or her abdominal hernia.  Ca1 25 was redrawn on March 20, 2018.  It was elevated at 73.  Interval Hx:  In the interim Morrison son Doren Custard underwent open heart surgery for a history of valvular disease from endocarditis.  He experienced perioperative complications and is in a critical state in the ICU.  She is unclear if he will recover from this.  She and her family are devastated from this event.  As a result she has not communicated her cancer recurrence diagnosis with her family and is not inclined to do so at this time.  She is also not inclined to undergo therapy at this time.  Current Meds:  Outpatient Encounter Medications as of 03/27/2018  Medication Sig  . Cholecalciferol (VITAMIN D) 2000 units CAPS Take 1 capsule by mouth daily.  Marland Kitchen ezetimibe (ZETIA) 10 MG tablet Take 10 mg by mouth daily.   Facility-Administered Encounter Medications as of 03/27/2018  Medication  . 0.9 %  sodium chloride infusion    Allergy: No Known Allergies  Social Hx:   Social History   Socioeconomic History  . Marital status: Married    Spouse name: Not on file  . Number of children: 2  . Years of education: Not on file  . Highest education level: Not on file   Occupational History  . Not on file  Social Needs  . Financial resource strain: Not on file  . Food insecurity:    Worry: Not on file    Inability: Not on file  . Transportation needs:    Medical: Not on file    Non-medical: Not on file  Tobacco Use  . Smoking status: Never Smoker  . Smokeless tobacco: Never Used  Substance and Sexual Activity  . Alcohol use: Yes    Alcohol/week: 5.4 oz    Types: 9 Glasses of wine per week    Comment: 9 GLASSES OF WINE A WEEK   . Drug use: No  . Sexual activity: Not Currently    Comment: 1ST INTERCOURSE- 21, PARTNERS- 1, MARRIED- 28 YRS   Lifestyle  . Physical activity:    Days per week: Not on file    Minutes per session: Not on file  . Stress: Not on file  Relationships  . Social connections:    Talks on phone: Not on file    Gets together: Not on file    Attends religious service: Not on file    Active member of club or organization: Not on file    Attends meetings of clubs or organizations: Not on file  Relationship status: Not on file  . Intimate partner violence:    Fear of current or ex partner: Not on file    Emotionally abused: Not on file    Physically abused: Not on file    Forced sexual activity: Not on file  Other Topics Concern  . Not on file  Social History Narrative  . Not on file    Past Surgical Hx:  Past Surgical History:  Procedure Laterality Date  . ABDOMINAL HYSTERECTOMY N/A 02/18/2016   Procedure: HYSTERECTOMY ABDOMINAL;  Surgeon: Everitt Amber, MD;  Location: [facility];  Service: Gynecology;  Laterality: N/A;  . DEBULKING N/A 02/18/2016   Procedure: RADICAL TUMOR DEBULKING;  Surgeon: Everitt Amber, MD;  Location: [facility];  Service: Gynecology;  Laterality: N/A;  . HYSTEROSCOPY    . LAPAROTOMY N/A 02/18/2016   Procedure: EXPLORATORY LAPAROTOMY;  Surgeon: Everitt Amber, MD;  Location: [facility];  Service: Gynecology;  Laterality: N/A;  . omental mass     Biopsy, CT guided  . OMENTECTOMY N/A 02/18/2016   Procedure:  OMENTECTOMY;  Surgeon: Everitt Amber, MD;  Location: [facility];  Service: Gynecology;  Laterality: N/A;  . SALPINGOOPHORECTOMY Bilateral 02/18/2016   Procedure: BILATERAL SALPINGO OOPHORECTOMY;  Surgeon: Everitt Amber, MD;  Location: [facility];  Service: Gynecology;  Laterality: Bilateral;  . TONSILLECTOMY AND ADENOIDECTOMY  1948    Past Medical Hx:  Past Medical History:  Diagnosis Date  . Arthritis    arthritis -hands-knees.  . Cancer ([place])    MELANOMA. multiple x5 different sites. last 10 yrs ago.  Marland Kitchen Headache    past hx.-no in awhile.  . High cholesterol   . Melanoma ([place])   . Osteopenia   . Skin cancer    melanoma    Past Gynecological History:  SVD x 2  No LMP recorded. Patient has had a hysterectomy.  Family Hx:  Family History  Problem Relation Age of Onset  . Colitis Sister   . Parkinsonism Maternal Grandmother   . Heart disease Paternal Grandmother   . Heart disease Paternal Grandfather   . Cancer Cousin 12       paternal first cousin  . Skin cancer Son     Review of Systems:  Constitutional  Feels well,    ENT Normal appearing ears and nares bilaterally Skin/Breast  No rash, sores, jaundice, itching, dryness Cardiovascular  No chest pain, shortness of breath, or edema  Pulmonary  No cough or wheeze.  Gastro Intestinal  No abdominal pain or difficulty eating. Vague abdominal discomfort. Genito Urinary  No frequency, urgency, dysuria, no bleeding Musculo Skeletal  No myalgia, arthralgia, joint swelling or pain  Neurologic  No weakness, numbness, change in gait,  Psychology  No depression, anxiety, insomnia.   Vitals:  Blood pressure 126/69, pulse 75, temperature 97.6 F (36.4 C), temperature source Oral, resp. rate 20, height 5' 3" (1.6 m), weight 152 lb 12.8 oz (69.3 kg), SpO2 99 %.  Physical Exam: WD in NAD Neck  Supple NROM, without any enlargements.  Lymph Node Survey No cervical supraclavicular or inguinal adenopathy Cardiovascular  Pulse normal  rate, regularity and rhythm. S1 and S2 normal.  Lungs  Clear to auscultation bilateraly, without wheezes/crackles/rhonchi. Good air movement.  Skin  No rash/lesions/breakdown  Psychiatry  Alert and oriented to person, place, and time  Abdomen  Normoactive bowel sounds, abdomen soft, non-tender and mildly overweight (BMI 26) with soft reducible ventral hernia at level of umbilicus. Well healed incision. No palpable masses. However there is  some subtle palpable generalized distension in the abdomen.  Back No CVA tenderness Genito Urinary  deferred Rectal  No posterior pelvic masses  Extremities  No bilateral cyanosis, clubbing or edema.  Thereasa Solo, MD  03/27/2018, 5:32 PM

## 2018-04-24 ENCOUNTER — Telehealth: Payer: Self-pay | Admitting: *Deleted

## 2018-04-24 NOTE — Telephone Encounter (Signed)
Patient called wanting to possible move her appts. Patient stated "I may be going out of town and need to move my appts. I will know soon." Patient will wait to move appts until she is sure. Appt on hold for July 26th in case she needs to move appts

## 2018-04-25 ENCOUNTER — Telehealth: Payer: Self-pay | Admitting: *Deleted

## 2018-04-25 NOTE — Telephone Encounter (Signed)
Patient called and left a message stating I can keep my appt for next week at 9am." I have called back and left a message with the date/time of July 17th at 2:15pm. Ask that she call the office to verify appt

## 2018-05-02 ENCOUNTER — Inpatient Hospital Stay: Payer: Medicare Other | Attending: Gynecologic Oncology | Admitting: Gynecologic Oncology

## 2018-05-02 ENCOUNTER — Encounter: Payer: Self-pay | Admitting: Gynecologic Oncology

## 2018-05-02 ENCOUNTER — Inpatient Hospital Stay: Payer: Medicare Other

## 2018-05-02 VITALS — BP 132/59 | HR 80 | Temp 98.2°F | Resp 20 | Ht 64.0 in | Wt 154.5 lb

## 2018-05-02 DIAGNOSIS — Z85820 Personal history of malignant melanoma of skin: Secondary | ICD-10-CM | POA: Diagnosis not present

## 2018-05-02 DIAGNOSIS — Z90722 Acquired absence of ovaries, bilateral: Secondary | ICD-10-CM | POA: Diagnosis not present

## 2018-05-02 DIAGNOSIS — Z95828 Presence of other vascular implants and grafts: Secondary | ICD-10-CM

## 2018-05-02 DIAGNOSIS — C5702 Malignant neoplasm of left fallopian tube: Secondary | ICD-10-CM | POA: Insufficient documentation

## 2018-05-02 DIAGNOSIS — C569 Malignant neoplasm of unspecified ovary: Secondary | ICD-10-CM

## 2018-05-02 DIAGNOSIS — Z7189 Other specified counseling: Secondary | ICD-10-CM | POA: Insufficient documentation

## 2018-05-02 DIAGNOSIS — Z90710 Acquired absence of both cervix and uterus: Secondary | ICD-10-CM

## 2018-05-02 DIAGNOSIS — Z9221 Personal history of antineoplastic chemotherapy: Secondary | ICD-10-CM | POA: Insufficient documentation

## 2018-05-02 DIAGNOSIS — G620 Drug-induced polyneuropathy: Secondary | ICD-10-CM | POA: Diagnosis not present

## 2018-05-02 DIAGNOSIS — R1909 Other intra-abdominal and pelvic swelling, mass and lump: Secondary | ICD-10-CM

## 2018-05-02 LAB — BASIC METABOLIC PANEL
Anion gap: 8 (ref 5–15)
BUN: 16 mg/dL (ref 8–23)
CHLORIDE: 107 mmol/L (ref 98–111)
CO2: 25 mmol/L (ref 22–32)
CREATININE: 0.75 mg/dL (ref 0.44–1.00)
Calcium: 9.3 mg/dL (ref 8.9–10.3)
GFR calc non Af Amer: >60 mL/min (ref >60)
Glucose, Bld: 117 mg/dL — ABNORMAL HIGH (ref 70–99)
Potassium: 4.3 mmol/L (ref 3.5–5.1)
Sodium: 140 mmol/L (ref 135–145)

## 2018-05-02 MED ORDER — SODIUM CHLORIDE 0.9% FLUSH
10.0000 mL | Freq: Once | INTRAVENOUS | Status: AC
  Administered 2018-05-02: 10 mL
  Filled 2018-05-02: qty 10

## 2018-05-02 MED ORDER — HEPARIN SOD (PORK) LOCK FLUSH 100 UNIT/ML IV SOLN
500.0000 [IU] | Freq: Once | INTRAVENOUS | Status: AC
  Administered 2018-05-02: 500 [IU]
  Filled 2018-05-02: qty 5

## 2018-05-02 NOTE — Progress Notes (Signed)
Follow-up Note: Gyn-Onc  Consult was initially requested by Dr. Toney Rakes for the evaluation of Jessica Johnston 77 y.o. female  CC:  Chief Complaint  Patient presents with  . Malignant neoplasm of ovary, unspecified laterality [facility])    Assessment/Plan:  [name]  is a 77 y.o.  year old with stage IIIC left fallopian tube cancer, s/p exploratory laparotomy, TAH, BSO, omentectomy, optimal cytoreductive surgery on 02/18/16 s/p 6 cycles adjuvant carboplatin and paclitaxel completed 07/15/16. BRCA negative. Elevated CA 125 suggesting recurrent disease (recurrence in June, 2019).   Asymptomatic.  Options for therapy would be expectant management until symptomatic vs commencing platinum based doublet therapy (carb/tax vs carb/doxil followed by niraparib maintenance). I discussed that average life expectancy is equivalent for both options. We will reassess CA 125 and CT abd/pelvis. If these show progression, I am recommending starting salvage therapy. I will see her back to discuss further if these show progression.   Asymptomatic umbilical incisional hernia. Declines surgical referral.  HPI: Jessica Johnston is a 77 year old para's woman who is seen in consultation at the request of Dr. Toney Rakes for abdominal peritoneal carcinomatosis and omental caking. The patient has a known history of a 3 cm left ovarian cyst for which she saw my partner approximately 15 months ago. At that time her CA-125 is mildly elevated to 49 in her Roma 1 score was also elevated. However the left ovarian cyst was unilocular, measured only 3 cm, and was long-standing, and therefore suspicion for occult malignancy was low.  In November 2016 she began feeling central and upper abdominal discomfort. Initially she thought it was part of the grieving process she lost her grandson. However when the pain and discomfort persisted she sought evaluation by her providers. A gynecologic evaluation was unremarkable. However  eventually she underwent CT scan imaging on 01/27/2016 for this persistent midabdominal pain. CT imaging showed a liver containing a few small low-density lesions which were equivocal for the potential of metastatic disease these were subcentimeter in dimension. There was an extensive omental nodularity which was highly worrisome for peritoneal carcinomatosis. A 3.5 cm stable left adnexal simple cyst was identified. This was not typical for ovarian cancer. There were no other obvious signs of metastatic disease including no gross ascites.  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 She is not required lymphadenectomy for any of these melanoma lesions. She is no family history significant for breast or ovarian cancer.   On 02/18/16 she underwent an exploratory laparotomy, TAH, BSO, omentectomy, radical tumor debulking and argon beam coagulation of milliary tumor implants. Disease was predominantly in the omentum with milliary studding of the ovaries and peritoneal surfaces. There was a right ovarian cyst measuring 3cm. There was no gross residual disease at completion of surgery with exception of the residual nodules that had received argon beam coagulation.  Postoperatively she did well with no complications.  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 The right ovarian cyst (that had been present in 2015) was benign.   She completed 6 cycles of adjuvant chemotherapy with carboplatin and paclitaxel on 07/15/16.   CA 125 was stable and normal at 17.2 on 08/08/16. CT abdo/pelvis on 08/05/16 showed no evidence for persistent disease.  CA 125 was stable and normal at 13.6 on 11/04/16, and 13.8 on 01/20/17.  Her sister was diagnosed with ovarian cancer in December, 2017. Jessica Johnston is  BRCA negative. Her sister is as yet untested.   CA 125 on 04/13/17 was normal at 16.1.  CT abdo/pelvis on 06/20/17  showed a small supraumbilical hernia, but no other abnormalities and no evidence of metastatic or recurrent disease. CA 125 was normal at 18.4 in September, 2018. Repeat CA 125 was normal and stable at 18.5 on 09/18/17.   Jessica Johnston reports some mild abdominal bloating. She denies pain. Her hernia is mildly symptomatic. She does not have symptoms of bowel obstruction.  CA 125 on 01/23/18 had increased from 18 to 35.4 suggestive of recurrence.  CT abd/pelvis on January 30, 2018 revealed no evidence of recurrence within the pelvis, no evidence of metastatic peritoneal disease or omental disease.  No solid organ metastases.  [name] traveled to [place] for a vacation and was doing well with no symptoms other than vague abdominal discomfort that she was unclear was related to either cancer recurrence or her abdominal hernia.  Ca1 25 was redrawn on March 20, 2018.  It was elevated at 73.  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  He experienced perioperative complications and was in a critical state in the ICU.  She and her family were devastated from this event.  As a result she has not communicated her cancer recurrence diagnosis with her family and is not inclined to do so at this time.  Her son pulled through but experienced a stroke and is in rehab for this.  She is accepting of pursuing further workup of her recurrence and considering therapy. She denies symptoms of recurrence apart from vague bloating.   Current Meds:  Outpatient Encounter Medications as of 05/02/2018  Medication Sig  . Cholecalciferol (VITAMIN D) 2000 units CAPS Take 1 capsule by mouth daily.  Marland Kitchen ezetimibe (ZETIA) 10 MG tablet Take 10 mg by mouth daily.   Facility-Administered Encounter Medications as of 05/02/2018  Medication  . 0.9 %  sodium chloride infusion    Allergy: No Known Allergies  Social Hx:   Social History   Socioeconomic  History  . Marital status: Married    Spouse name: Not on file  . Number of children: 2  . Years of education: Not on file  . Highest education level: Not on file  Occupational History  . Not on file  Social Needs  . Financial resource strain: Not on file  . Food insecurity:    Worry: Not on file    Inability: Not on file  . Transportation needs:    Medical: Not on file    Non-medical: Not on file  Tobacco Use  . Smoking status: Never Smoker  . Smokeless tobacco: Never Used  Substance and Sexual Activity  . Alcohol use: Yes    Alcohol/week: 5.4 oz    Types: 9 Glasses of wine per week    Comment: 9 GLASSES OF WINE A WEEK   . Drug use: No  . Sexual activity: Not Currently    Comment: 1ST INTERCOURSE- 21, PARTNERS- 1, MARRIED- 55 YRS   Lifestyle  . Physical activity:    Days per week: Not on file    Minutes per session: Not on file  . Stress: Not on file  Relationships  . Social connections:    Talks on phone: Not on file    Gets together: Not on file    Attends religious service: Not on file    Active member of club or  organization: Not on file    Attends meetings of clubs or organizations: Not on file    Relationship status: Not on file  . Intimate partner violence:    Fear of current or ex partner: Not on file    Emotionally abused: Not on file    Physically abused: Not on file    Forced sexual activity: Not on file  Other Topics Concern  . Not on file  Social History Narrative  . Not on file    Past Surgical Hx:  Past Surgical History:  Procedure Laterality Date  . ABDOMINAL HYSTERECTOMY N/A 02/18/2016   Procedure: HYSTERECTOMY ABDOMINAL;  Surgeon: Everitt Amber, MD;  Location: [facility];  Service: Gynecology;  Laterality: N/A;  . DEBULKING N/A 02/18/2016   Procedure: RADICAL TUMOR DEBULKING;  Surgeon: Everitt Amber, MD;  Location: [facility];  Service: Gynecology;  Laterality: N/A;  . HYSTEROSCOPY    . LAPAROTOMY N/A 02/18/2016   Procedure: EXPLORATORY LAPAROTOMY;  Surgeon:  Everitt Amber, MD;  Location: [facility];  Service: Gynecology;  Laterality: N/A;  . omental mass     Biopsy, CT guided  . OMENTECTOMY N/A 02/18/2016   Procedure: OMENTECTOMY;  Surgeon: Everitt Amber, MD;  Location: [facility];  Service: Gynecology;  Laterality: N/A;  . SALPINGOOPHORECTOMY Bilateral 02/18/2016   Procedure: BILATERAL SALPINGO OOPHORECTOMY;  Surgeon: Everitt Amber, MD;  Location: [facility];  Service: Gynecology;  Laterality: Bilateral;  . TONSILLECTOMY AND ADENOIDECTOMY  1948    Past Medical Hx:  Past Medical History:  Diagnosis Date  . Arthritis    arthritis -hands-knees.  . Cancer ([place])    MELANOMA. multiple x5 different sites. last 10 yrs ago.  Marland Kitchen Headache    past hx.-no in awhile.  . High cholesterol   . Melanoma ([place])   . Osteopenia   . Skin cancer    melanoma    Past Gynecological History:  SVD x 2  No LMP recorded. Patient has had a hysterectomy.  Family Hx:  Family History  Problem Relation Age of Onset  . Colitis Sister   . Parkinsonism Maternal Grandmother   . Heart disease Paternal Grandmother   . Heart disease Paternal Grandfather   . Cancer Cousin 12       paternal first cousin  . Skin cancer Son     Review of Systems:  Constitutional  Feels well,    ENT Normal appearing ears and nares bilaterally Skin/Breast  No rash, sores, jaundice, itching, dryness Cardiovascular  No chest pain, shortness of breath, or edema  Pulmonary  No cough or wheeze.  Gastro Intestinal  No abdominal pain or difficulty eating. Vague abdominal discomfort. Genito Urinary  No frequency, urgency, dysuria, no bleeding Musculo Skeletal  No myalgia, arthralgia, joint swelling or pain  Neurologic  No weakness, numbness, change in gait,  Psychology  No depression, anxiety, insomnia.   Vitals:  Blood pressure (!) 132/59, pulse 80, temperature 98.2 F (36.8 C), temperature source Oral, resp. rate 20, height _0  (1.626 m), weight 154 lb 8 oz (70.1 kg), SpO2 98 %.  Physical Exam: WD  in NAD Neck  Supple NROM, without any enlargements.  Lymph Node Survey No cervical supraclavicular or inguinal adenopathy Cardiovascular  Pulse normal rate, regularity and rhythm. S1 and S2 normal.  Lungs  Clear to auscultation bilateraly, without wheezes/crackles/rhonchi. Good air movement.  Skin  No rash/lesions/breakdown  Psychiatry  Alert and oriented to person, place, and time  Abdomen  Normoactive bowel sounds, abdomen soft, non-tender and mildly overweight (  BMI 26) with soft reducible ventral hernia at level of umbilicus. Well healed incision. No palpable masses. However there is some subtle palpable generalized distension in the abdomen.  Back No CVA tenderness Genito Urinary  deferred Rectal  No posterior pelvic masses  Extremities  No bilateral cyanosis, clubbing or edema.  Jessica Solo, MD  05/02/2018, 2:14 PM

## 2018-05-02 NOTE — Patient Instructions (Signed)
Dr Denman George is checking your CA 125 blood test and rechecking the CT scan now that it has been 3 months since the last evaluation.  She will contact you with the results.

## 2018-05-03 LAB — CA 125: CANCER ANTIGEN (CA) 125: 127.1 U/mL — AB (ref 0.0–38.1)

## 2018-05-08 ENCOUNTER — Ambulatory Visit (HOSPITAL_COMMUNITY)
Admission: RE | Admit: 2018-05-08 | Discharge: 2018-05-08 | Disposition: A | Discharge status: Home / Self Care | Payer: Medicare Other | Source: Ambulatory Visit | Attending: Gynecologic Oncology | Admitting: Gynecologic Oncology

## 2018-05-08 ENCOUNTER — Encounter (HOSPITAL_COMMUNITY): Payer: Self-pay

## 2018-05-08 ENCOUNTER — Telehealth: Payer: Self-pay | Admitting: Gynecologic Oncology

## 2018-05-08 DIAGNOSIS — C5700 Malignant neoplasm of unspecified fallopian tube: Secondary | ICD-10-CM | POA: Diagnosis not present

## 2018-05-08 DIAGNOSIS — R971 Elevated cancer antigen 125 [CA 125]: Secondary | ICD-10-CM | POA: Diagnosis not present

## 2018-05-08 DIAGNOSIS — R1909 Other intra-abdominal and pelvic swelling, mass and lump: Secondary | ICD-10-CM | POA: Diagnosis not present

## 2018-05-08 DIAGNOSIS — R935 Abnormal findings on diagnostic imaging of other abdominal regions, including retroperitoneum: Secondary | ICD-10-CM | POA: Diagnosis not present

## 2018-05-08 MED ORDER — IOPAMIDOL (ISOVUE-300) INJECTION 61%
100.0000 mL | Freq: Once | INTRAVENOUS | Status: AC | PRN
  Administered 2018-05-08: 100 mL via INTRAVENOUS

## 2018-05-08 MED ORDER — IOPAMIDOL (ISOVUE-300) INJECTION 61%
INTRAVENOUS | Status: AC
  Filled 2018-05-08: qty 100

## 2018-05-08 NOTE — Telephone Encounter (Signed)
Left message that we should talk either in person or by phone about "moving forward" with treatment.  I will discuss the scan results with her either in person with her family or by phone if she would prefer.  I am going to recommend moving forward with carboplatin and Doxil followed by Niraparib PARPi therapy.   Thereasa Solo, MD

## 2018-05-09 ENCOUNTER — Telehealth: Payer: Self-pay | Admitting: *Deleted

## 2018-05-09 NOTE — Telephone Encounter (Signed)
Called and scheduled the patient for a family consult on Thursday afternoon

## 2018-05-10 ENCOUNTER — Telehealth: Payer: Self-pay | Admitting: Oncology

## 2018-05-10 ENCOUNTER — Inpatient Hospital Stay (HOSPITAL_BASED_OUTPATIENT_CLINIC_OR_DEPARTMENT_OTHER): Payer: Medicare Other | Admitting: Gynecologic Oncology

## 2018-05-10 VITALS — BP 104/55 | HR 72 | Temp 97.6°F | Resp 20 | Ht 64.0 in | Wt 153.4 lb

## 2018-05-10 DIAGNOSIS — Z90710 Acquired absence of both cervix and uterus: Secondary | ICD-10-CM | POA: Diagnosis not present

## 2018-05-10 DIAGNOSIS — C5702 Malignant neoplasm of left fallopian tube: Secondary | ICD-10-CM | POA: Diagnosis not present

## 2018-05-10 DIAGNOSIS — Z7189 Other specified counseling: Secondary | ICD-10-CM | POA: Diagnosis not present

## 2018-05-10 DIAGNOSIS — C569 Malignant neoplasm of unspecified ovary: Secondary | ICD-10-CM

## 2018-05-10 DIAGNOSIS — G620 Drug-induced polyneuropathy: Secondary | ICD-10-CM | POA: Diagnosis not present

## 2018-05-10 DIAGNOSIS — Z85820 Personal history of malignant melanoma of skin: Secondary | ICD-10-CM | POA: Diagnosis not present

## 2018-05-10 NOTE — Telephone Encounter (Signed)
Called patient with appointment to see Dr. Alvy Bimler on 05/14/18 at 10:30 am.  She verbalized understanding and agreement.

## 2018-05-10 NOTE — Progress Notes (Signed)
Follow-up Note: Gyn-Onc  Consult was initially requested by Dr. Toney Rakes for the evaluation of Jessica Johnston 77 y.o. female  CC:  Chief Complaint  Patient presents with  . recurrent ovarian cancer    Assessment/Plan:  [name]  is a 77 y.o.  year old with stage IIIC left fallopian tube cancer, s/p exploratory laparotomy, TAH, BSO, omentectomy, optimal cytoreductive surgery on 02/18/16 s/p 6 cycles adjuvant carboplatin and paclitaxel completed 07/15/16. BRCA negative. Elevated CA 125 suggesting recurrent disease (recurrence in June, 2019).  Peritoneal recurrence on CT scan.  Minimally symptomatic.  I recommend commencing salvage platinum based doublet therapy (carb/tax vs carb/doxil followed by niraparib maintenance).   Asymptomatic umbilical incisional hernia. Declines surgical referral.  HPI: Jessica Johnston is a 77 year old para's woman who is seen in consultation at the request of Dr. Toney Rakes for abdominal peritoneal carcinomatosis and omental caking. The patient has a known history of a 3 cm left ovarian cyst for which she saw my partner approximately 15 months ago. At that time her CA-125 is mildly elevated to 49 in her Roma 1 score was also elevated. However the left ovarian cyst was unilocular, measured only 3 cm, and was long-standing, and therefore suspicion for occult malignancy was low.  In November 2016 she began feeling central and upper abdominal discomfort. Initially she thought it was part of the grieving process she lost her grandson. However when the pain and discomfort persisted she sought evaluation by her providers. A gynecologic evaluation was unremarkable. However eventually she underwent CT scan imaging on 01/27/2016 for this persistent midabdominal pain. CT imaging showed a liver containing a few small low-density lesions which were equivocal for the potential of metastatic disease these were subcentimeter in dimension. There was an extensive omental  nodularity which was highly worrisome for peritoneal carcinomatosis. A 3.5 cm stable left adnexal simple cyst was identified. This was not typical for ovarian cancer. There were no other obvious signs of metastatic disease including no gross ascites.  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 She is not required lymphadenectomy for any of these melanoma lesions. She is no family history significant for breast or ovarian cancer.   On 02/18/16 she underwent an exploratory laparotomy, TAH, BSO, omentectomy, radical tumor debulking and argon beam coagulation of milliary tumor implants. Disease was predominantly in the omentum with milliary studding of the ovaries and peritoneal surfaces. There was a right ovarian cyst measuring 3cm. There was no gross residual disease at completion of surgery with exception of the residual nodules that had received argon beam coagulation.  Postoperatively she did well with no complications.  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 The right ovarian cyst (that had been present in 2015) was benign.   She completed 6 cycles of adjuvant chemotherapy with carboplatin and paclitaxel on 07/15/16.   CA 125 was stable and normal at 17.2 on 08/08/16. CT abdo/pelvis on 08/05/16 showed no evidence for persistent disease.  CA 125 was stable and normal at 13.6 on 11/04/16, and 13.8 on 01/20/17.  Her sister was diagnosed with ovarian cancer in December, 2017. [name] is BRCA negative. Her sister is as yet untested.   CA 125 on 04/13/17 was normal at 16.1.  CT abdo/pelvis on 06/20/17 showed a small supraumbilical hernia, but no other abnormalities and no evidence of metastatic or recurrent disease. CA 125 was normal at 18.4  in September, 2018. Repeat CA 125 was normal and stable at 18.5 on 09/18/17.   Di reported some mild abdominal bloating in April, 2019.  CA 125 on  01/23/18 had increased from 18 to 35.4 suggestive of recurrence.  CT abd/pelvis on January 30, 2018 revealed no evidence of recurrence within the pelvis, no evidence of metastatic peritoneal disease or omental disease.  No solid organ metastases.  Jessica Johnston traveled to [place] for a vacation and was doing well with no symptoms other than vague abdominal discomfort that she was unclear was related to either cancer recurrence or her abdominal hernia.  Ca1 25 was redrawn on March 20, 2018.  It was progressively elevated at 73. She elected for expectant management as her son Doren Custard underwent open heart surgery for a history of valvular disease from endocarditis.  He experienced perioperative complications and was in a critical state in the ICU.  She and her family were devastated from this event. Her son pulled through but experienced a stroke and was in rehab for this.  Interval Hx:   CA 125 on 05/02/18 was 127. On 05/08/18 CT abd/pelvis showed : 7 x 6 x 6.6 cm collection of low-density soft tissue is identified in the left subdiaphragmatic space of the upper abdomen, lateral to the stomach and anterior to the spleen. This is new since 08/05/2016 and progressive since 01/30/2018. Similar 17 mm low-density soft tissue lesion posterior to the spleen is new since 01/30/2018. Small focus of abnormal soft tissue or fluid is identified along the lateral wall the cecum. Other areas of trace fluid are seen around right lower quadrant small bowel loops  She denies symptoms of recurrence apart from vague bloating.   Current Meds:  Outpatient Encounter Medications as of 05/10/2018  Medication Sig  . Cholecalciferol (VITAMIN D) 2000 units CAPS Take 1 capsule by mouth daily.  Marland Kitchen ezetimibe (ZETIA) 10 MG tablet Take 10 mg by mouth daily.   Facility-Administered Encounter Medications as of 05/10/2018  Medication  . 0.9 %  sodium chloride infusion    Allergy: No Known Allergies  Social Hx:   Social History   Socioeconomic  History  . Marital status: Married    Spouse name: Not on file  . Number of children: 2  . Years of education: Not on file  . Highest education level: Not on file  Occupational History  . Not on file  Social Needs  . Financial resource strain: Not on file  . Food insecurity:    Worry: Not on file    Inability: Not on file  . Transportation needs:    Medical: Not on file    Non-medical: Not on file  Tobacco Use  . Smoking status: Never Smoker  . Smokeless tobacco: Never Used  Substance and Sexual Activity  . Alcohol use: Yes    Alcohol/week: 5.4 oz    Types: 9 Glasses of wine per week    Comment: 9 GLASSES OF WINE A WEEK   . Drug use: No  . Sexual activity: Not Currently    Comment: 1ST INTERCOURSE- 21, PARTNERS- 1, MARRIED- 61 YRS   Lifestyle  . Physical activity:    Days per week: Not on file    Minutes per session: Not on file  . Stress: Not on file  Relationships  . Social connections:    Talks on phone: Not on file    Gets together: Not on file    Attends religious service: Not on file    Active  member of club or organization: Not on file    Attends meetings of clubs or organizations: Not on file    Relationship status: Not on file  . Intimate partner violence:    Fear of current or ex partner: Not on file    Emotionally abused: Not on file    Physically abused: Not on file    Forced sexual activity: Not on file  Other Topics Concern  . Not on file  Social History Narrative  . Not on file    Past Surgical Hx:  Past Surgical History:  Procedure Laterality Date  . ABDOMINAL HYSTERECTOMY N/A 02/18/2016   Procedure: HYSTERECTOMY ABDOMINAL;  Surgeon: Everitt Amber, MD;  Location: [facility];  Service: Gynecology;  Laterality: N/A;  . DEBULKING N/A 02/18/2016   Procedure: RADICAL TUMOR DEBULKING;  Surgeon: Everitt Amber, MD;  Location: [facility];  Service: Gynecology;  Laterality: N/A;  . HYSTEROSCOPY    . LAPAROTOMY N/A 02/18/2016   Procedure: EXPLORATORY LAPAROTOMY;  Surgeon:  Everitt Amber, MD;  Location: [facility];  Service: Gynecology;  Laterality: N/A;  . omental mass     Biopsy, CT guided  . OMENTECTOMY N/A 02/18/2016   Procedure: OMENTECTOMY;  Surgeon: Everitt Amber, MD;  Location: [facility];  Service: Gynecology;  Laterality: N/A;  . SALPINGOOPHORECTOMY Bilateral 02/18/2016   Procedure: BILATERAL SALPINGO OOPHORECTOMY;  Surgeon: Everitt Amber, MD;  Location: [facility];  Service: Gynecology;  Laterality: Bilateral;  . TONSILLECTOMY AND ADENOIDECTOMY  1948    Past Medical Hx:  Past Medical History:  Diagnosis Date  . Arthritis    arthritis -hands-knees.  . Cancer ([place])    MELANOMA. multiple x5 different sites. last 10 yrs ago.  Marland Kitchen Headache    past hx.-no in awhile.  . High cholesterol   . Melanoma ([place])   . Osteopenia   . Skin cancer    melanoma    Past Gynecological History:  SVD x 2  No LMP recorded. Patient has had a hysterectomy.  Family Hx:  Family History  Problem Relation Age of Onset  . Colitis Sister   . Parkinsonism Maternal Grandmother   . Heart disease Paternal Grandmother   . Heart disease Paternal Grandfather   . Cancer Cousin 12       paternal first cousin  . Skin cancer Son     Review of Systems:  Constitutional  Feels well,    ENT Normal appearing ears and nares bilaterally Skin/Breast  No rash, sores, jaundice, itching, dryness Cardiovascular  No chest pain, shortness of breath, or edema  Pulmonary  No cough or wheeze.  Gastro Intestinal  No abdominal pain or difficulty eating. Vague abdominal discomfort. Genito Urinary  No frequency, urgency, dysuria, no bleeding Musculo Skeletal  No myalgia, arthralgia, joint swelling or pain  Neurologic  No weakness, numbness, change in gait,  Psychology  No depression, anxiety, insomnia.   Vitals:  Blood pressure 99/62, pulse 76, temperature 97.6 F (36.4 C), temperature source Oral, resp. rate 20, height _0  (1.626 m), weight 153 lb 6.4 oz (69.6 kg), SpO2 98 %.  Physical Exam: WD in  NAD Neck  Supple NROM, without any enlargements.  Lymph Node Survey No cervical supraclavicular or inguinal adenopathy Cardiovascular  Pulse normal rate, regularity and rhythm. S1 and S2 normal.  Lungs  Clear to auscultation bilateraly, without wheezes/crackles/rhonchi. Good air movement.  Skin  No rash/lesions/breakdown  Psychiatry  Alert and oriented to person, place, and time  Abdomen  Normoactive bowel sounds, abdomen soft, non-tender  and mildly overweight (BMI 26) with soft reducible ventral hernia at level of umbilicus. Well healed incision. No palpable masses. However there is some subtle palpable generalized distension in the abdomen.  Back No CVA tenderness Genito Urinary  deferred Rectal  No posterior pelvic masses  Extremities  No bilateral cyanosis, clubbing or edema.  Thereasa Solo, MD  05/10/2018, 2:34 PM

## 2018-05-10 NOTE — Patient Instructions (Signed)
Plan to meet with Dr. Alvy Bimler for discussion and initiation of chemotherapy.  Please call for any questions or concerns.

## 2018-05-11 ENCOUNTER — Encounter

## 2018-05-11 DIAGNOSIS — F4322 Adjustment disorder with anxiety: Secondary | ICD-10-CM | POA: Diagnosis not present

## 2018-05-11 MED ORDER — VENLAFAXINE SR 75 MG 24 HR CAP
75 mg | ORAL_CAPSULE | ORAL | 2 refills | Status: DC
Start: 2018-05-11 — End: 2019-02-07

## 2018-05-14 ENCOUNTER — Encounter: Payer: Self-pay | Admitting: Oncology

## 2018-05-14 ENCOUNTER — Telehealth: Payer: Self-pay | Admitting: Oncology

## 2018-05-14 ENCOUNTER — Inpatient Hospital Stay: Payer: Medicare Other

## 2018-05-14 ENCOUNTER — Inpatient Hospital Stay (HOSPITAL_BASED_OUTPATIENT_CLINIC_OR_DEPARTMENT_OTHER): Payer: Medicare Other | Admitting: Hematology and Oncology

## 2018-05-14 ENCOUNTER — Encounter: Payer: Self-pay | Admitting: Hematology and Oncology

## 2018-05-14 ENCOUNTER — Telehealth: Payer: Self-pay

## 2018-05-14 ENCOUNTER — Telehealth: Payer: Self-pay | Admitting: Hematology and Oncology

## 2018-05-14 VITALS — BP 120/68 | HR 76 | Temp 97.6°F | Resp 18 | Ht 64.0 in | Wt 154.3 lb

## 2018-05-14 DIAGNOSIS — Z7189 Other specified counseling: Secondary | ICD-10-CM

## 2018-05-14 DIAGNOSIS — Z90710 Acquired absence of both cervix and uterus: Secondary | ICD-10-CM | POA: Diagnosis not present

## 2018-05-14 DIAGNOSIS — C5700 Malignant neoplasm of unspecified fallopian tube: Secondary | ICD-10-CM

## 2018-05-14 DIAGNOSIS — G620 Drug-induced polyneuropathy: Secondary | ICD-10-CM

## 2018-05-14 DIAGNOSIS — C5702 Malignant neoplasm of left fallopian tube: Secondary | ICD-10-CM

## 2018-05-14 DIAGNOSIS — Z85820 Personal history of malignant melanoma of skin: Secondary | ICD-10-CM | POA: Diagnosis not present

## 2018-05-14 DIAGNOSIS — Z5111 Encounter for antineoplastic chemotherapy: Secondary | ICD-10-CM | POA: Insufficient documentation

## 2018-05-14 DIAGNOSIS — C569 Malignant neoplasm of unspecified ovary: Secondary | ICD-10-CM | POA: Diagnosis not present

## 2018-05-14 LAB — CBC WITH DIFFERENTIAL/PLATELET
BASOS ABS: 0 10*3/uL (ref 0.0–0.1)
Basophils Relative: 0 %
EOS PCT: 2 %
Eosinophils Absolute: 0.1 10*3/uL (ref 0.0–0.5)
HEMATOCRIT: 39.4 % (ref 34.8–46.6)
Hemoglobin: 13.2 g/dL (ref 11.6–15.9)
LYMPHS ABS: 1.6 10*3/uL (ref 0.9–3.3)
LYMPHS PCT: 27 %
MCH: 31.1 pg (ref 25.1–34.0)
MCHC: 33.5 g/dL (ref 31.5–36.0)
MCV: 92.9 fL (ref 79.5–101.0)
MONO ABS: 0.6 10*3/uL (ref 0.1–0.9)
MONOS PCT: 9 %
Neutro Abs: 3.9 10*3/uL (ref 1.5–6.5)
Neutrophils Relative %: 62 %
PLATELETS: 240 10*3/uL (ref 145–400)
RBC: 4.24 MIL/uL (ref 3.70–5.45)
RDW: 13.2 % (ref 11.2–14.5)
WBC: 6.2 10*3/uL (ref 3.9–10.3)

## 2018-05-14 LAB — COMPREHENSIVE METABOLIC PANEL
ALK PHOS: 81 U/L (ref 38–126)
ALT: 22 U/L (ref 0–44)
AST: 18 U/L (ref 15–41)
Albumin: 4.2 g/dL (ref 3.5–5.0)
Anion gap: 8 (ref 5–15)
BILIRUBIN TOTAL: 0.3 mg/dL (ref 0.3–1.2)
BUN: 13 mg/dL (ref 8–23)
CALCIUM: 9.6 mg/dL (ref 8.9–10.3)
CHLORIDE: 103 mmol/L (ref 98–111)
CO2: 27 mmol/L (ref 22–32)
CREATININE: 0.74 mg/dL (ref 0.44–1.00)
Glucose, Bld: 94 mg/dL (ref 70–99)
Potassium: 4.3 mmol/L (ref 3.5–5.1)
Sodium: 138 mmol/L (ref 135–145)
TOTAL PROTEIN: 7 g/dL (ref 6.5–8.1)

## 2018-05-14 MED ORDER — ONDANSETRON HCL 8 MG PO TABS: 8.0000 mg | ORAL_TABLET | Freq: Three times a day (TID) | ORAL | 1 refills | Status: DC | PRN

## 2018-05-14 MED ORDER — PROCHLORPERAZINE MALEATE 10 MG PO TABS: 10.0000 mg | ORAL_TABLET | Freq: Four times a day (QID) | ORAL | 1 refills | Status: DC | PRN

## 2018-05-14 NOTE — Telephone Encounter (Signed)
Left a message for patient that the chemotherapy education materials that she requested are available for pick up at the volunteer desk in the [facility].

## 2018-05-14 NOTE — Telephone Encounter (Signed)
Called and given echo appt for Echo 7/31 at 1000, instructed to arrive at 0945 to registration. She verbalized understanding.

## 2018-05-14 NOTE — Progress Notes (Signed)
START OFF PATHWAY REGIMEN - Ovarian   [id number]:Liposomal Doxorubicin (Doxil) 30 mg/m2 + Carboplatin AUC=5 q28 Days:   A cycle is every 28 days:     Liposomal doxorubicin      Carboplatin   **Always confirm dose/schedule in your pharmacy ordering system**  Patient Characteristics: Recurrent or Progressive Disease, Second Line Therapy, Platinum Sensitive and ? 6 Months Since Last Therapy Therapeutic Status: Recurrent or Progressive Disease BRCA Mutation Status: Did Not Order Test Line of Therapy: Second Line  Intent of Therapy: Non-Curative / Palliative Intent, Discussed with Patient

## 2018-05-14 NOTE — Patient Instructions (Signed)
Doxorubicin Liposomal injection What is this medicine? LIPOSOMAL DOXORUBICIN (LIP oh som al dox oh ROO bi sin) is a chemotherapy drug. This medicine is used to treat many kinds of cancer like Kaposi's sarcoma, multiple myeloma, and ovarian cancer. This medicine may be used for other purposes; ask your health care provider or pharmacist if you have questions. COMMON BRAND NAME(S): Doxil, Lipodox What should I tell my health care provider before I take this medicine? They need to know if you have any of these conditions: -blood disorders -heart disease -infection (especially a virus infection such as chickenpox, cold sores, or herpes) -liver disease -recent or ongoing radiation therapy -an unusual or allergic reaction to doxorubicin, other chemotherapy agents, soybeans, other medicines, foods, dyes, or preservatives -pregnant or trying to get pregnant -breast-feeding How should I use this medicine? This drug is given as an infusion into a vein. It is administered in a hospital or clinic by a specially trained health care professional. If you have pain, swelling, burning or any unusual feeling around the site of your injection, tell your health care professional right away. Talk to your pediatrician regarding the use of this medicine in children. Special care may be needed. Overdosage: If you think you have taken too much of this medicine contact a poison control center or emergency room at once. NOTE: This medicine is only for you. Do not share this medicine with others. What if I miss a dose? It is important not to miss your dose. Call your doctor or health care professional if you are unable to keep an appointment. What may interact with this medicine? Do not take this medicine with any of the following medications: -zidovudine This medicine may also interact with the following medications: -medicines to increase blood counts like filgrastim, pegfilgrastim, sargramostim -vaccines Talk to  your doctor or health care professional before taking any of these medicines: -acetaminophen -aspirin -ibuprofen -ketoprofen -naproxen This list may not describe all possible interactions. Give your health care provider a list of all the medicines, herbs, non-prescription drugs, or dietary supplements you use. Also tell them if you smoke, drink alcohol, or use illegal drugs. Some items may interact with your medicine. What should I watch for while using this medicine? Your condition will be monitored carefully while you are receiving this medicine. You will need important blood work done while you are taking this medicine. This drug may make you feel generally unwell. This is not uncommon, as chemotherapy can affect healthy cells as well as cancer cells. Report any side effects. Continue your course of treatment even though you feel ill unless your doctor tells you to stop. Your urine may turn orange-red for a few days after your dose. This is not blood. If your urine is dark or brown, call your doctor. In some cases, you may be given additional medicines to help with side effects. Follow all directions for their use. Call your doctor or health care professional for advice if you get a fever (100.5 degrees F or higher), chills or sore throat, or other symptoms of a cold or flu. Do not treat yourself. This drug decreases your body's ability to fight infections. Try to avoid being around people who are sick. This medicine may increase your risk to bruise or bleed. Call your doctor or health care professional if you notice any unusual bleeding. Be careful brushing and flossing your teeth or using a toothpick because you may get an infection or bleed more easily. If you have any dental  or bleed. Call your doctor or health care professional if you notice any unusual bleeding.  Be careful brushing and flossing your teeth or using a toothpick because you may get an infection or bleed more easily. If you have any dental work done, tell your dentist you are receiving this medicine.  Avoid taking products that contain aspirin, acetaminophen, ibuprofen, naproxen, or ketoprofen unless instructed by your doctor. These medicines may hide a  fever.  Men and women of childbearing age should use effective birth control methods while using taking this medicine. Do not become pregnant while taking this medicine. There is a potential for serious side effects to an unborn child. Talk to your health care professional or pharmacist for more information. Do not breast-feed an infant while taking this medicine.  Talk to your doctor about your risk of cancer. You may be more at risk for certain types of cancers if you take this medicine.  What side effects may I notice from receiving this medicine?  Side effects that you should report to your doctor or health care professional as soon as possible:  -allergic reactions like skin rash, itching or hives, swelling of the face, lips, or tongue  -low blood counts - this medicine may decrease the number of white blood cells, red blood cells and platelets. You may be at increased risk for infections and bleeding.  -signs of hand-foot syndrome - tingling or burning, redness, flaking, swelling, small blisters, or small sores on the palms of your hands or the soles of your feet  -signs of infection - fever or chills, cough, sore throat, pain or difficulty passing urine  -signs of decreased platelets or bleeding - bruising, pinpoint red spots on the skin, black, tarry stools, blood in the urine  -signs of decreased red blood cells - unusually weak or tired, fainting spells, lightheadedness  -back pain, chills, facial flushing, fever, headache, tightness in the chest or throat during the infusion  -breathing problems  -chest pain  -fast, irregular heartbeat  -mouth pain, redness, sores  -pain, swelling, redness at site where injected  -pain, tingling, numbness in the hands or feet  -swelling of ankles, feet, or hands  -vomiting  Side effects that usually do not require medical attention (report to your doctor or health care professional if they continue or are bothersome):  -diarrhea  -hair loss  -loss of appetite  -nail  discoloration or damage  -nausea  -red or watery eyes  -red colored urine  -stomach upset  This list may not describe all possible side effects. Call your doctor for medical advice about side effects. You may report side effects to FDA at 1-800-FDA-1088.  Where should I keep my medicine?  This drug is given in a hospital or clinic and will not be stored at home.  NOTE: This sheet is a summary. It may not cover all possible information. If you have questions about this medicine, talk to your doctor, pharmacist, or health care provider.   2018 [employer]/Gold Standard (2012-06-22 10:12:56)  Carboplatin injection  What is this medicine?  CARBOPLATIN (KAR boe pla tin) is a chemotherapy drug. It targets fast dividing cells, like cancer cells, and causes these cells to die. This medicine is used to treat ovarian cancer and many other cancers.  This medicine may be used for other purposes; ask your health care provider or pharmacist if you have questions.  COMMON BRAND NAME(S): Paraplatin  What should I tell my health care provider before I take this medicine?    They need to know if you have any of these conditions:  -blood disorders  -hearing problems  -kidney disease  -recent or ongoing radiation therapy  -an unusual or allergic reaction to carboplatin, cisplatin, other chemotherapy, other medicines, foods, dyes, or preservatives  -pregnant or trying to get pregnant  -breast-feeding  How should I use this medicine?  This drug is usually given as an infusion into a vein. It is administered in a hospital or clinic by a specially trained health care professional.  Talk to your pediatrician regarding the use of this medicine in children. Special care may be needed.  Overdosage: If you think you have taken too much of this medicine contact a poison control center or emergency room at once.  NOTE: This medicine is only for you. Do not share this medicine with others.  What if I miss a dose?  It is important not to miss a dose.  Call your doctor or health care professional if you are unable to keep an appointment.  What may interact with this medicine?  -medicines for seizures  -medicines to increase blood counts like filgrastim, pegfilgrastim, sargramostim  -some antibiotics like amikacin, gentamicin, neomycin, streptomycin, tobramycin  -vaccines  Talk to your doctor or health care professional before taking any of these medicines:  -acetaminophen  -aspirin  -ibuprofen  -ketoprofen  -naproxen  This list may not describe all possible interactions. Give your health care provider a list of all the medicines, herbs, non-prescription drugs, or dietary supplements you use. Also tell them if you smoke, drink alcohol, or use illegal drugs. Some items may interact with your medicine.  What should I watch for while using this medicine?  Your condition will be monitored carefully while you are receiving this medicine. You will need important blood work done while you are taking this medicine.  This drug may make you feel generally unwell. This is not uncommon, as chemotherapy can affect healthy cells as well as cancer cells. Report any side effects. Continue your course of treatment even though you feel ill unless your doctor tells you to stop.  In some cases, you may be given additional medicines to help with side effects. Follow all directions for their use.  Call your doctor or health care professional for advice if you get a fever, chills or sore throat, or other symptoms of a cold or flu. Do not treat yourself. This drug decreases your body's ability to fight infections. Try to avoid being around people who are sick.  This medicine may increase your risk to bruise or bleed. Call your doctor or health care professional if you notice any unusual bleeding.  Be careful brushing and flossing your teeth or using a toothpick because you may get an infection or bleed more easily. If you have any dental work done, tell your dentist you are receiving this  medicine.  Avoid taking products that contain aspirin, acetaminophen, ibuprofen, naproxen, or ketoprofen unless instructed by your doctor. These medicines may hide a fever.  Do not become pregnant while taking this medicine. Women should inform their doctor if they wish to become pregnant or think they might be pregnant. There is a potential for serious side effects to an unborn child. Talk to your health care professional or pharmacist for more information. Do not breast-feed an infant while taking this medicine.  What side effects may I notice from receiving this medicine?  Side effects that you should report to your doctor or health care professional as soon as   possible:  -allergic reactions like skin rash, itching or hives, swelling of the face, lips, or tongue  -signs of infection - fever or chills, cough, sore throat, pain or difficulty passing urine  -signs of decreased platelets or bleeding - bruising, pinpoint red spots on the skin, black, tarry stools, nosebleeds  -signs of decreased red blood cells - unusually weak or tired, fainting spells, lightheadedness  -breathing problems  -changes in hearing  -changes in vision  -chest pain  -high blood pressure  -low blood counts - This drug may decrease the number of white blood cells, red blood cells and platelets. You may be at increased risk for infections and bleeding.  -nausea and vomiting  -pain, swelling, redness or irritation at the injection site  -pain, tingling, numbness in the hands or feet  -problems with balance, talking, walking  -trouble passing urine or change in the amount of urine  Side effects that usually do not require medical attention (report to your doctor or health care professional if they continue or are bothersome):  -hair loss  -loss of appetite  -metallic taste in the mouth or changes in taste  This list may not describe all possible side effects. Call your doctor for medical advice about side effects. You may report side effects to  FDA at 1-800-FDA-1088.  Where should I keep my medicine?  This drug is given in a hospital or clinic and will not be stored at home.  NOTE: This sheet is a summary. It may not cover all possible information. If you have questions about this medicine, talk to your doctor, pharmacist, or health care provider.   2018 [employer]/Gold Standard (2008-01-08 14:38:05)

## 2018-05-14 NOTE — Progress Notes (Signed)
Patient on plan of care prior to pathways. 

## 2018-05-14 NOTE — Telephone Encounter (Signed)
Gave patient AVS and calendar.  [facility] pharmacy to verify length of tx for first visit 3 hr).

## 2018-05-15 ENCOUNTER — Other Ambulatory Visit (HOSPITAL_COMMUNITY)
Admission: RE | Admit: 2018-05-15 | Discharge: 2018-05-15 | Disposition: A | Discharge status: Home / Self Care | Payer: Medicare Other | Source: Ambulatory Visit | Attending: Hematology and Oncology | Admitting: Hematology and Oncology

## 2018-05-15 ENCOUNTER — Encounter: Payer: Self-pay | Admitting: Hematology and Oncology

## 2018-05-15 DIAGNOSIS — Z7189 Other specified counseling: Secondary | ICD-10-CM | POA: Insufficient documentation

## 2018-05-15 DIAGNOSIS — C5700 Malignant neoplasm of unspecified fallopian tube: Secondary | ICD-10-CM | POA: Insufficient documentation

## 2018-05-15 DIAGNOSIS — C569 Malignant neoplasm of unspecified ovary: Secondary | ICD-10-CM | POA: Diagnosis not present

## 2018-05-15 LAB — CA 125: CANCER ANTIGEN (CA) 125: 166 U/mL — AB (ref 0.0–38.1)

## 2018-05-15 NOTE — Assessment & Plan Note (Signed)
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 We discussed importance of Advanced Directives and Living will. We discussed CODE STATUS; the patient desires to remain in full code. 

## 2018-05-15 NOTE — Progress Notes (Signed)
[facility] FOLLOW-UP progress notes  Patient Care Team: [name], MD as PCP - General (Internal Medicine)  CHIEF COMPLAINTS/PURPOSE OF VISIT:  Recurrent fallopian tube cancer, for chemotherapy  HISTORY OF PRESENTING ILLNESS:  Jessica Johnston 77 y.o. female was transferred to my care after her prior physician has left.  I reviewed the patient's records extensive and collaborated the history with the patient. 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 Extensive omental nodularity highly worrisome for peritoneal carcinomatosis. Late recurrence of melanoma can present as peritoneal carcinomatosis. Ovarian cancer more commonly presents in this manner. Patient does have a predominately low density right adnexal lesion measuring up to 3.5 cm, although this lesion is not typical for ovarian cancer. 2. No evidence of bowel or ureteral obstruction. 3. No other definite signs of metastatic disease. There are small indeterminate low-density hepatic lesions.      02/08/2016 Tumor Marker    Patient's tumor was tested for the following markers: CA-125 Results of the tumor marker test revealed 656.2      02/15/2016 Procedure    CT-guided core biopsy performed of omental mass just deep to the abdominal wall.      02/15/2016 Pathology Results    Omentum, biopsy, left - PAPILLARY SEROUS NEOPLASM, SEE COMMENT. Microscopic Comment There are papillary collections of largely low grade appearing cells with numerous psammoma bodies. Given the limited material it is difficult to distinguish between invasive implants of a serous borderline tumor and serous carcinoma, especially given the low grade  appearance.      02/18/2016 Pathology Results    1. Omentum, resection for tumor INVASIVE IMPLANT OF HIGH GRADE SEROUS CARCINOMA 2. Uterus +/- tubes/ovaries, neoplastic HIGH GRADE SEROUS CARCINOMA INVOLVING LEFT TUBAL FIMBRIA SEROUS CARCINOMA WITH PREDOMINANT PSAMMOMA BODIES IMPLANT AT UTERUS SEROSA, BILATERAL FALLOPIAN TUBAL SEROSA, AND ANTERIOR PERITONEAL REFLECTION CERVIX: HISTOLOGICAL UNREMARKABLE ENDOMETRIUM: INACTIVE ENDOMETRIUM MYOMETRIUM: LEIOMYOMA LEFT OVARY: CYSTADENOFIBROMA RIGHT OVARY AND FALLOPIAN TUBE: HISTOLOGICAL UNREMARKABLE Microscopic Comment 2. ONCOLOGY TABLE - FALLOPIAN TUBE 1. Specimen, including laterality: Omentum, uterus, bilateral ovaries and fallopian tubes 2. Procedure: Hysterectomy, bilateral salpingo-oophorectomy and tumor debulking omentectomy 3. Lymph node sampling performed: No 4. Tumor site: uterus serosa, bilateral fallopian tubal serosa, peritoneal and omentum 5. Tumor location in fallopian tube: Left fallopian tubal fimbria 6. Specimen integrity (intact/ruptured/disrupted): Intact 7. Tumor size (cm): multi focal invasive omentum implant greater than 2 cm, left fallopian tube tumor 0.8 cm. 8. Histologic type: Serous carcinoma 9. Grade: 3 10. Microscopic tumor extension: uterus serosa, bilateral fallopian tubal serosa, peritoneal and omentum 11. Margins: NA 12. Lymph-Vascular invasion: identified 13. Lymph nodes: # examined: 0; # positive: NA 14. TNM: pT3c, pNx 15. FIGO Stage (based on pathologic findings, needs clinical correlation: IIIC 16. Comment: High grade serous carcinoma multifocally and extensively involves left fallopian tubal fimbria, omentum, uterus serosa, bilateral fallopian tubal serosa and peritoneal. At the left fallopian tube there are small foci of serous tubal intraepithelial carcinoma identified, so we conclude the carcinoma is fallopian tube origin      03/09/2016 Tumor Marker    Patient's tumor was tested for the following  markers: CA-125 Results of the tumor marker test revealed  154.4      03/15/2016 Procedure    Technically successful right IJ power-injectable port catheter placement. Ready for routine use.      03/18/2016 - 07/13/2016 Chemotherapy    The patient had 6 cycles of carboplatin and taxol      04/14/2016 Tumor Marker    Patient's tumor was tested for the following markers: CA-125 Results of the tumor marker test revealed 36.7      04/25/2016 Genetic Testing    Patient has genetic testing done for germline mutation Results revealed patient has no mutation      04/28/2016 Tumor Marker    Patient's tumor was tested for the following markers: CA-125 Results of the tumor marker test revealed 24.6      06/21/2016 Tumor Marker    Patient's tumor was tested for the following markers: CA-125 Results of the tumor marker test revealed 16.6      08/01/2016 Tumor Marker    Patient's tumor was tested for the following markers: CA-125 Results of the tumor marker test revealed 17.2      08/05/2016 Imaging    Interval TAH-BSO. No evidence of residual pelvic mass or metastatic disease within the abdomen or pelvis. No other acute findings.       11/04/2016 Tumor Marker    Patient's tumor was tested for the following markers: CA-125 Results of the tumor marker test revealed 13.6      01/20/2017 Tumor Marker    Patient's tumor was tested for the following markers: CA-125 Results of the tumor marker test revealed 13.8      04/14/2017 Tumor Marker    Patient's tumor was tested for the following markers: CA-125 Results of the tumor marker test revealed 16.1      06/13/2017 Imaging    No acute findings.  No mass or hernia identified.  Colonic diverticulosis, without radiographic evidence of diverticulitis.  Aortic atherosclerosis.      07/03/2017 Tumor Marker    Patient's tumor was tested for the following markers: CA-125 Results of the tumor marker test revealed 18.4      09/19/2017 Tumor  Marker    Patient's tumor was tested for the following markers: CA-125 Results of the tumor marker test revealed 18.5      01/23/2018 Tumor Marker    Patient's tumor was tested for the following markers: CA-125 Results of the tumor marker test revealed 35.4      01/30/2018 Imaging    1. No evidence of local fallopian tube carcinoma recurrence within the pelvis. Post hysterectomy. 2. No evidence of metastatic peritoneal disease or omental disease. No solid organ metastasis.       03/20/2018 Tumor Marker    Patient's tumor was tested for the following markers: CA-125 Results of the tumor marker test revealed 73      05/02/2018 Tumor Marker    Patient's tumor was tested for the following markers: CA-125 Results of the tumor marker test revealed 127.1      05/08/2018 Imaging    CT abdomen and pelvis 1. 7 cm left subdiaphragmatic collection of loculated fluid versus low-density soft tissue. Given the patient's history of rising CA 125, metastatic disease considered highly likely.  2. Areas of fluid or recurrent disease identified in the right lower quadrant adjacent to the cecum and along right lower quadrant small bowel loops.      05/14/2018 Tumor Marker    Patient's tumor was tested for the following markers: CA-125 Results of the tumor marker test revealed 166  She feels well.  She has occasional discomfort in her abdomen.  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  She denies recent weight loss  MEDICAL HISTORY:  Past Medical History:  Diagnosis Date  . Arthritis    arthritis -hands-knees.  . Cancer ([place])    MELANOMA. multiple x5 different sites. last 10 yrs ago.  Marland Kitchen Headache    past hx.-no in awhile.  . High cholesterol   . Melanoma ([place])   . Osteopenia   . Skin cancer     melanoma    SURGICAL HISTORY: Past Surgical History:  Procedure Laterality Date  . ABDOMINAL HYSTERECTOMY N/A 02/18/2016   Procedure: HYSTERECTOMY ABDOMINAL;  Surgeon: Everitt Amber, MD;  Location: [facility];  Service: Gynecology;  Laterality: N/A;  . DEBULKING N/A 02/18/2016   Procedure: RADICAL TUMOR DEBULKING;  Surgeon: Everitt Amber, MD;  Location: [facility];  Service: Gynecology;  Laterality: N/A;  . HYSTEROSCOPY    . LAPAROTOMY N/A 02/18/2016   Procedure: EXPLORATORY LAPAROTOMY;  Surgeon: Everitt Amber, MD;  Location: [facility];  Service: Gynecology;  Laterality: N/A;  . omental mass     Biopsy, CT guided  . OMENTECTOMY N/A 02/18/2016   Procedure: OMENTECTOMY;  Surgeon: Everitt Amber, MD;  Location: [facility];  Service: Gynecology;  Laterality: N/A;  . SALPINGOOPHORECTOMY Bilateral 02/18/2016   Procedure: BILATERAL SALPINGO OOPHORECTOMY;  Surgeon: Everitt Amber, MD;  Location: [facility];  Service: Gynecology;  Laterality: Bilateral;  . TONSILLECTOMY AND ADENOIDECTOMY  1948    SOCIAL HISTORY: Social History   Socioeconomic History  . Marital status: Married    Spouse name: Hinton Dyer  . Number of children: 2  . Years of education: Not on file  . Highest education level: Not on file  Occupational History  . Occupation: retired housewife  Social Needs  . Financial resource strain: Not on file  . Food insecurity:    Worry: Not on file    Inability: Not on file  . Transportation needs:    Medical: Not on file    Non-medical: Not on file  Tobacco Use  . Smoking status: Never Smoker  . Smokeless tobacco: Never Used  Substance and Sexual Activity  . Alcohol use: Yes    Alcohol/week: 5.4 oz    Types: 9 Glasses of wine per week    Comment: 9 GLASSES OF WINE A WEEK   . Drug use: No  . Sexual activity: Not Currently    Comment: 1ST INTERCOURSE- 21, PARTNERS- 1, MARRIED- 7 YRS   Lifestyle  . Physical activity:    Days per week: Not on file    Minutes per session: Not on file  . Stress: Not on file   Relationships  . Social connections:    Talks on phone: Not on file    Gets together: Not on file    Attends religious service: Not on file    Active member of club or organization: Not on file    Attends meetings of clubs or organizations: Not on file    Relationship status: Not on file  . Intimate partner violence:    Fear of current or ex partner: Not on file    Emotionally abused: Not on file    Physically abused: Not on file    Forced sexual activity: Not on file  Other Topics Concern  .  Not on file  Social History Narrative  . Not on file    FAMILY HISTORY: Family History  Problem Relation Age of Onset  . Colitis Sister   . Cancer Sister 3       ovarian ca  . Parkinsonism Maternal Grandmother   . Heart disease Paternal Grandmother   . Heart disease Paternal Grandfather   . Cancer Cousin 12       paternal first cousin  . Skin cancer Son     ALLERGIES:  has No Known Allergies.  MEDICATIONS:  Current Outpatient Medications  Medication Sig Dispense Refill  . Cholecalciferol (VITAMIN D) 2000 units CAPS Take 1 capsule by mouth daily.    Marland Kitchen ezetimibe (ZETIA) 10 MG tablet Take 10 mg by mouth daily.    . ondansetron (ZOFRAN) 8 MG tablet Take 1 tablet (8 mg total) by mouth every 8 (eight) hours as needed for refractory nausea / vomiting. Start on day 3 after chemo. 30 tablet 1  . prochlorperazine (COMPAZINE) 10 MG tablet Take 1 tablet (10 mg total) by mouth every 6 (six) hours as needed (Nausea or vomiting). 30 tablet 1   No current facility-administered medications for this visit.     REVIEW OF SYSTEMS:   Constitutional: Denies fevers, chills or abnormal night sweats Eyes: Denies blurriness of vision, double vision or watery eyes Ears, nose, mouth, throat, and face: Denies mucositis or sore throat Respiratory: Denies cough, dyspnea or wheezes Cardiovascular: Denies palpitation, chest discomfort or lower extremity swelling Skin: Denies abnormal skin  rashes Lymphatics: Denies new lymphadenopathy or easy bruising Neurological:Denies numbness, tingling or new weaknesses Behavioral/Psych: Mood is stable, no new changes  All other systems were reviewed with the patient and are negative.  PHYSICAL EXAMINATION: ECOG PERFORMANCE STATUS: 1 - Symptomatic but completely ambulatory  Vitals:   05/14/18 1039  BP: 120/68  Pulse: 76  Resp: 18  Temp: 97.6 F (36.4 C)  SpO2: 98%   Filed Weights   05/14/18 1039  Weight: 154 lb 4.8 oz (70 kg)    GENERAL:alert, no distress and comfortable SKIN: skin color, texture, turgor are normal, no rashes or significant lesions EYES: normal, conjunctiva are pink and non-injected, sclera clear OROPHARYNX:no exudate, normal lips, buccal mucosa, and tongue  NECK: supple, thyroid normal size, non-tender, without nodularity LYMPH:  no palpable lymphadenopathy in the cervical, axillary or inguinal LUNGS: clear to auscultation and percussion with normal breathing effort HEART: regular rate & rhythm and no murmurs without lower extremity edema ABDOMEN:abdomen soft, non-tender and normal bowel sounds Musculoskeletal:no cyanosis of digits and no clubbing  PSYCH: alert & oriented x 3 with fluent speech NEURO: no focal motor/sensory deficits  LABORATORY DATA:  I have reviewed the data as listed Lab Results  Component Value Date   WBC 6.2 05/14/2018   HGB 13.2 05/14/2018   HCT 39.4 05/14/2018   MCV 92.9 05/14/2018   PLT 240 05/14/2018   Recent Labs    01/24/18 1455 05/02/18 1503 05/14/18 1202  NA 141 140 138  K 4.4 4.3 4.3  CL 108 107 103  CO2 26 25 27   GLUCOSE 118 117* 94  BUN 12 16 13   CREATININE 0.78 0.75 0.74  CALCIUM 9.6 9.3 9.6  GFRNONAA >60 >60 >60  GFRAA >60 >60 >60  PROT  --   --  7.0  ALBUMIN  --   --  4.2  AST  --   --  18  ALT  --   --  22  ALKPHOS  --   --  23  BILITOT  --   --  0.3    RADIOGRAPHIC STUDIES: I have reviewed the imaging studies with the patient and family I  have personally reviewed the radiological images as listed and agreed with the findings in the report. Ct Abdomen Pelvis W Contrast  Result Date: 05/08/2018 CLINICAL DATA:  Fallopian tube cancer. EXAM: CT ABDOMEN AND PELVIS WITH CONTRAST TECHNIQUE: Multidetector CT imaging of the abdomen and pelvis was performed using the standard protocol following bolus administration of intravenous contrast. CONTRAST:  119mL ISOVUE-300 IOPAMIDOL (ISOVUE-300) INJECTION 61% COMPARISON:  01/30/2018 FINDINGS: Lower chest: Unremarkable. Hepatobiliary: Tiny hypodensity in the medial segment left liver adjacent to the gallbladder is stable. There is no evidence for gallstones, gallbladder wall thickening, or pericholecystic fluid. No intrahepatic or extrahepatic biliary dilation. Pancreas: No focal mass lesion. No dilatation of the main duct. No intraparenchymal cyst. No peripancreatic edema. Spleen: No splenomegaly. No focal mass lesion. Adrenals/Urinary Tract: No adrenal nodule or mass. Kidneys unremarkable. No evidence for hydroureter. The urinary bladder appears normal for the degree of distention. Stomach/Bowel: Tiny hiatal hernia. Stomach is nondistended. No gastric wall thickening. No evidence of outlet obstruction. Duodenum is normally positioned as is the ligament of Treitz. No small bowel wall thickening. No small bowel dilatation. The terminal ileum is normal. The appendix is normal. No gross colonic mass. No colonic wall thickening. No substantial diverticular change. Vascular/Lymphatic: There is abdominal aortic atherosclerosis without aneurysm. There is no gastrohepatic or hepatoduodenal ligament lymphadenopathy. No intraperitoneal or retroperitoneal lymphadenopathy. No pelvic sidewall lymphadenopathy. Reproductive: Uterus surgically absent.  There is no adnexal mass. Other: 7 x 6 x 6.6 cm collection of low-density soft tissue is identified in the left subdiaphragmatic space of the upper abdomen, lateral to the stomach  and anterior to the spleen. This is new since 08/05/2016 and progressive since 01/30/2018. Similar 17 mm low-density soft tissue lesion posterior to the spleen (2:20) is new since 01/30/2018. Small focus of abnormal soft tissue or fluid is identified along the lateral wall the cecum (best seen coronal series 5: Image 44). Other areas of trace fluid are seen around right lower quadrant small bowel loops (5:50). Musculoskeletal: No worrisome lytic or sclerotic osseous abnormality. Right paramidline ventral hernia contains only fat. There is a small right paraumbilical hernia, stable in the interval. IMPRESSION: 1. 7 cm left subdiaphragmatic collection of loculated fluid versus low-density soft tissue. Given the patient's history of rising CA 125, metastatic disease considered highly likely. 2. Areas of fluid or recurrent disease identified in the right lower quadrant adjacent to the cecum and along right lower quadrant small bowel loops. Electronically Signed   By: Misty Stanley M.D.   On: 05/08/2018 12:03    ASSESSMENT & PLAN:  Fallopian tube cancer, carcinoma ([place]) 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 We reviewed the current guidelines and ultimately decide to proceed with treatment with carboplatin and doxil  The recommendation is based on publication below:  [place] (2012) 107, [phone] & 2012 [employer] [place] All rights reserved 0007 - 0920/12  Final overall survival results of phase III [facility] CALYPSO trial of pegylated liposomal doxorubicin and carboplatin vs paclitaxel and carboplatin in platinum-sensitive ovarian cancer patients  BACKGROUND: The CALYPSO phase III trial compared CD (carboplatin-pegylated liposomal doxorubicin (PLD)) with CP  (carboplatinpaclitaxel) in patients with platinum-sensitive  recurrent ovarian cancer (ROC). Overall survival (OS) data are now mature. METHODS: Women with ROC relapsing 46 months after first- or second-line therapy were randomised to CD or CP for six cycles in this international, open-label, non-inferiority trial. The primary endpoint was progression-free survival. The OS analysis is presented here. RESULTS: A total of 976 patients were randomised (467 to CD and 509 to CP). With a median follow-up of 49 months, no statistically significant difference was observed between arms in OS (hazard ratio0.99 (95% confidence interval 0.85, 1.16); log-rank P0.94). Median survival times were 30.7 months (CD) and 33.0 months (CP). No statistically significant difference in OS was observed between arms in predetermined subgroups according to age, body mass index, treatment-free interval, measurable disease, number of lines of prior chemotherapy, or performance status. Post-study cross-over was imbalanced between arms, with a greater proportion of patients randomised to CP receiving post-study PLD (68%) than patients randomised to CD receiving post-study paclitaxel (43%; Po0.001). CONCLUSION: Carboplatin-PLD led to delayed progression and similar OS compared with carboplatin-paclitaxel in platinum-sensitive ROC.  We discussed the role of chemotherapy. The intent is of palliative intent.  We discussed some of the risks, benefits, side-effects of carboplatin and liposomal doxorubicin  Some of the short term side-effects included, though not limited to, including weight loss, life threatening infections, risk of allergic reactions, need for transfusions of blood products, nausea, vomiting, change in bowel habits, loss of hair, risk of congestive heart failure, admission to hospital for various reasons, and risks of death.   Long term side-effects are also discussed including risks of infertility, permanent damage to  nerve function, hearing loss, chronic fatigue, kidney damage with possibility needing hemodialysis, and rare secondary malignancy including bone marrow disorders.  The patient is aware that the response rates discussed earlier is not guaranteed.  After a long discussion, patient made an informed decision to proceed with the prescribed plan of care.  We discussed the use of cardiac imaging before and during treatment and close monitoring for signs of heart failure while on treatment She understood treatment goal is palliative I will order baseline echocardiogram before we proceed with treatment I plan to see her prior to cycle 2 of therapy    History of melanoma She will continue close follow-up with dermatologist  Goals of care, counseling/discussion The patient is aware she has incurable disease and treatment is strictly palliative. We discussed importance of Advanced Directives and Living will. We discussed CODE STATUS; the patient desires to remain in full code.   Orders Placed This Encounter  Procedures  . Comprehensive metabolic panel    Standing Status:   Standing    Number of Occurrences:   22    Standing Expiration Date:   05/15/2019  . CBC with Differential/Platelet    Standing Status:   Standing    Number of Occurrences:   22    Standing Expiration Date:   05/15/2019  . CA 125    Standing Status:   Standing    Number of Occurrences:   11    Standing Expiration Date:   05/15/2019  . ECHOCARDIOGRAM COMPLETE    Standing Status:   Future    Standing Expiration Date:   08/15/2019    Order Specific Question:   Where should this test be performed    Answer:   [place]    Order Specific Question:   Perflutren DEFINITY (image enhancing agent) should be administered unless hypersensitivity or allergy exist    Answer:   Administer Perflutren  Order Specific Question:   Expected Date:    Answer:   ASAP    All questions were answered. The patient knows to call the clinic  with any problems, questions or concerns. I spent 60 minutes counseling the patient face to face. The total time spent in the appointment was 80 minutes and more than 50% was on counseling.     [name], MD 05/15/2018 1:15 PM

## 2018-05-15 NOTE — Assessment & Plan Note (Signed)
She will continue close follow-up with dermatologist

## 2018-05-15 NOTE — Assessment & Plan Note (Signed)
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 We reviewed the current guidelines and ultimately decide to proceed with treatment with carboplatin and doxil  The recommendation is based on publication below:  [place] (2012) 107, [phone] & 2012 [employer] [place] All rights reserved 0007 - 0920/12  Final overall survival results of phase III [facility] CALYPSO trial of pegylated liposomal doxorubicin and carboplatin vs paclitaxel and carboplatin in platinum-sensitive ovarian cancer patients  BACKGROUND: The CALYPSO phase III trial compared CD (carboplatin-pegylated liposomal doxorubicin (PLD)) with CP (carboplatinpaclitaxel) 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 Overall survival (OS) data are now mature. METHODS: Women with ROC relapsing 46 months after first- or second-line therapy were randomised to CD or CP for six cycles in this international, open-label, non-inferiority trial. The primary endpoint was progression-free survival. The OS analysis is presented here. RESULTS: A total of 976 patients were randomised (467 to CD and 509 to CP). With a median follow-up of 49 months, no statistically significant difference was observed between arms in OS (hazard ratio0.99 (95% confidence interval 0.85, 1.16); log-rank P0.94). Median survival times were 30.7 months (CD) and 33.0 months (CP). No statistically significant difference in OS was observed between arms in predetermined subgroups according to age, body mass index, treatment-free interval, measurable disease, number of lines of prior chemotherapy, or performance status. Post-study cross-over was imbalanced between arms, with a greater proportion of patients randomised to CP receiving post-study PLD  (68%) than patients randomised to CD receiving post-study paclitaxel (43%; Po0.001). CONCLUSION: Carboplatin-PLD led to delayed progression and similar OS compared with carboplatin-paclitaxel in platinum-sensitive ROC.  We discussed the role of chemotherapy. The intent is of palliative intent.  We discussed some of the risks, benefits, side-effects of carboplatin and liposomal doxorubicin  Some of the short term side-effects included, though not limited to, including weight loss, life threatening infections, risk of allergic reactions, need for transfusions of blood products, nausea, vomiting, change in bowel habits, loss of hair, risk of congestive heart failure, admission to hospital for various reasons, and risks of death.   Long term side-effects are also discussed including risks of infertility, permanent damage to nerve function, hearing loss, chronic fatigue, kidney damage with possibility needing hemodialysis, and rare secondary malignancy including bone marrow disorders.  The patient is aware that the response rates discussed earlier is not guaranteed.  After a long discussion, patient made an informed decision to proceed with the prescribed plan of care.  We discussed the use of cardiac imaging before and during treatment and close monitoring for signs of heart failure while on treatment She understood treatment goal is palliative I will order baseline echocardiogram before we proceed with treatment I plan to see her prior to cycle 2 of therapy

## 2018-05-16 ENCOUNTER — Other Ambulatory Visit: Payer: Self-pay | Admitting: Hematology and Oncology

## 2018-05-16 ENCOUNTER — Ambulatory Visit (HOSPITAL_COMMUNITY)
Admission: RE | Admit: 2018-05-16 | Discharge: 2018-05-16 | Disposition: A | Discharge status: Home / Self Care | Payer: Medicare Other | Source: Ambulatory Visit | Attending: Hematology and Oncology | Admitting: Hematology and Oncology

## 2018-05-16 DIAGNOSIS — I517 Cardiomegaly: Secondary | ICD-10-CM | POA: Diagnosis not present

## 2018-05-16 DIAGNOSIS — Z85828 Personal history of other malignant neoplasm of skin: Secondary | ICD-10-CM | POA: Insufficient documentation

## 2018-05-16 DIAGNOSIS — Z5111 Encounter for antineoplastic chemotherapy: Secondary | ICD-10-CM

## 2018-05-16 DIAGNOSIS — C5700 Malignant neoplasm of unspecified fallopian tube: Secondary | ICD-10-CM | POA: Diagnosis not present

## 2018-05-16 DIAGNOSIS — E785 Hyperlipidemia, unspecified: Secondary | ICD-10-CM | POA: Diagnosis not present

## 2018-05-16 NOTE — Progress Notes (Signed)
  Echocardiogram 2D Echocardiogram has been performed.  Bobbye Charleston 05/16/2018, 10:48 AM

## 2018-05-17 ENCOUNTER — Other Ambulatory Visit: Payer: Self-pay | Admitting: Hematology and Oncology

## 2018-05-17 ENCOUNTER — Telehealth: Payer: Self-pay

## 2018-05-17 ENCOUNTER — Telehealth: Payer: Self-pay | Admitting: Oncology

## 2018-05-17 NOTE — Telephone Encounter (Signed)
Called and left below message. Instructed to call for questions. 

## 2018-05-17 NOTE — Telephone Encounter (Signed)
No need steroids No need Onpro

## 2018-05-17 NOTE — Telephone Encounter (Signed)
-----   Message from [name], MD sent at 05/17/2018  8:15 AM EDT ----- Regarding: ECHO ok Pls let her know ECHO result is OK ----- Message ----- From: Interface, Rad Results In Sent: 05/16/2018   3:21 PM To: [name], MD

## 2018-05-17 NOTE — Telephone Encounter (Signed)
Patient advised of message below.  She also has questions about chemo that she does not remember from having the chemo class last time.  Arranged meeting with Royston Sinner, Chemotherapy Educator, at 9 am before treatment tomorrow

## 2018-05-17 NOTE — Telephone Encounter (Signed)
Jessica Johnston called and said she remembered having to take take steroids before chemo last time.  She is wondering if she needs to take them this time.  She also was asking about whether she would need On Pro and when that would be done.

## 2018-05-18 ENCOUNTER — Inpatient Hospital Stay: Payer: Medicare Other | Attending: Gynecologic Oncology

## 2018-05-18 ENCOUNTER — Encounter: Payer: Self-pay | Admitting: Oncology

## 2018-05-18 VITALS — BP 121/66 | HR 69 | Temp 98.0°F | Resp 18

## 2018-05-18 DIAGNOSIS — C5702 Malignant neoplasm of left fallopian tube: Secondary | ICD-10-CM | POA: Diagnosis not present

## 2018-05-18 DIAGNOSIS — I959 Hypotension, unspecified: Secondary | ICD-10-CM | POA: Insufficient documentation

## 2018-05-18 DIAGNOSIS — D701 Agranulocytosis secondary to cancer chemotherapy: Secondary | ICD-10-CM | POA: Insufficient documentation

## 2018-05-18 DIAGNOSIS — T451X5A Adverse effect of antineoplastic and immunosuppressive drugs, initial encounter: Secondary | ICD-10-CM | POA: Diagnosis not present

## 2018-05-18 DIAGNOSIS — Z5111 Encounter for antineoplastic chemotherapy: Secondary | ICD-10-CM | POA: Diagnosis not present

## 2018-05-18 MED ORDER — DEXAMETHASONE SODIUM PHOSPHATE 10 MG/ML IJ SOLN
10.0000 mg | Freq: Once | INTRAMUSCULAR | Status: AC
  Administered 2018-05-18: 10 mg via INTRAVENOUS

## 2018-05-18 MED ORDER — FAMOTIDINE IN NACL 20-0.9 MG/50ML-% IV SOLN
20.0000 mg | Freq: Once | INTRAVENOUS | Status: AC
  Administered 2018-05-18: 20 mg via INTRAVENOUS

## 2018-05-18 MED ORDER — PALONOSETRON HCL INJECTION 0.25 MG/5ML
INTRAVENOUS | Status: AC
  Filled 2018-05-18: qty 5

## 2018-05-18 MED ORDER — CARBOPLATIN CHEMO INJECTION 450 MG/45ML
389.5000 mg | Freq: Once | INTRAVENOUS | Status: AC
  Administered 2018-05-18: 390 mg via INTRAVENOUS
  Filled 2018-05-18: qty 39

## 2018-05-18 MED ORDER — DIPHENHYDRAMINE HCL 25 MG PO CAPS
25.0000 mg | ORAL_CAPSULE | Freq: Once | ORAL | Status: AC
  Administered 2018-05-18: 25 mg via ORAL

## 2018-05-18 MED ORDER — PALONOSETRON HCL INJECTION 0.25 MG/5ML
0.2500 mg | Freq: Once | INTRAVENOUS | Status: AC
  Administered 2018-05-18: 0.25 mg via INTRAVENOUS

## 2018-05-18 MED ORDER — HEPARIN SOD (PORK) LOCK FLUSH 100 UNIT/ML IV SOLN
500.0000 [IU] | Freq: Once | INTRAVENOUS | Status: AC | PRN
  Administered 2018-05-18: 500 [IU]
  Filled 2018-05-18: qty 5

## 2018-05-18 MED ORDER — DEXTROSE 5 % IV SOLN
Freq: Once | INTRAVENOUS | Status: AC
  Administered 2018-05-18: 09:00:00 via INTRAVENOUS
  Filled 2018-05-18: qty 250

## 2018-05-18 MED ORDER — DEXAMETHASONE SODIUM PHOSPHATE 10 MG/ML IJ SOLN
INTRAMUSCULAR | Status: AC
  Filled 2018-05-18: qty 1

## 2018-05-18 MED ORDER — FAMOTIDINE IN NACL 20-0.9 MG/50ML-% IV SOLN
INTRAVENOUS | Status: AC
  Filled 2018-05-18: qty 50

## 2018-05-18 MED ORDER — DIPHENHYDRAMINE HCL 25 MG PO CAPS
ORAL_CAPSULE | ORAL | Status: AC
  Filled 2018-05-18: qty 1

## 2018-05-18 MED ORDER — SODIUM CHLORIDE 0.9% FLUSH
10.0000 mL | INTRAVENOUS | Status: DC | PRN
  Administered 2018-05-18: 10 mL
  Filled 2018-05-18: qty 10

## 2018-05-18 MED ORDER — DOXORUBICIN HCL LIPOSOMAL CHEMO INJECTION 2 MG/ML
50.0000 mg | Freq: Once | INTRAVENOUS | Status: AC
  Administered 2018-05-18: 50 mg via INTRAVENOUS
  Filled 2018-05-18: qty 25

## 2018-05-18 NOTE — Patient Instructions (Signed)
[facility] Discharge Instructions for Patients Receiving Chemotherapy  Today you received the following chemotherapy agents Doxil, Carboplatin  To help prevent nausea and vomiting after your treatment, we encourage you to take your nausea medication as directed   If you develop nausea and vomiting that is not controlled by your nausea medication, call the clinic.   BELOW ARE SYMPTOMS THAT SHOULD BE REPORTED IMMEDIATELY:  *FEVER GREATER THAN 100.5 F  *CHILLS WITH OR WITHOUT FEVER  NAUSEA AND VOMITING THAT IS NOT CONTROLLED WITH YOUR NAUSEA MEDICATION  *UNUSUAL SHORTNESS OF BREATH  *UNUSUAL BRUISING OR BLEEDING  TENDERNESS IN MOUTH AND THROAT WITH OR WITHOUT PRESENCE OF ULCERS  *URINARY PROBLEMS  *BOWEL PROBLEMS  UNUSUAL RASH Items with * indicate a potential emergency and should be followed up as soon as possible.  Feel free to call the clinic should you have any questions or concerns. The clinic phone number is [phone].  Please show the [place] at check-in to the Emergency Department and triage nurse.   Doxorubicin Liposomal injection What is this medicine? LIPOSOMAL DOXORUBICIN (LIP oh som al dox oh ROO bi sin) is a chemotherapy drug. This medicine is used to treat many kinds of cancer like Kaposi's sarcoma, multiple myeloma, and ovarian cancer. This medicine may be used for other purposes; ask your health care provider or pharmacist if you have questions. COMMON BRAND NAME(S): Doxil, Lipodox What should I tell my health care provider before I take this medicine? They need to know if you have any of these conditions: -blood disorders -heart disease -infection (especially a virus infection such as chickenpox, cold sores, or herpes) -liver disease -recent or ongoing radiation therapy -an unusual or allergic reaction to doxorubicin, other chemotherapy agents, soybeans, other medicines, foods, dyes, or preservatives -pregnant or trying to  get pregnant -breast-feeding How should I use this medicine? This drug is given as an infusion into a vein. It is administered in a hospital or clinic by a specially trained health care professional. If you have pain, swelling, burning or any unusual feeling around the site of your injection, tell your health care professional right away. Talk to your pediatrician regarding the use of this medicine in children. Special care may be needed. Overdosage: If you think you have taken too much of this medicine contact a poison control center or emergency room at once. NOTE: This medicine is only for you. Do not share this medicine with others. What if I miss a dose? It is important not to miss your dose. Call your doctor or health care professional if you are unable to keep an appointment. What may interact with this medicine? Do not take this medicine with any of the following medications: -zidovudine This medicine may also interact with the following medications: -medicines to increase blood counts like filgrastim, pegfilgrastim, sargramostim -vaccines Talk to your doctor or health care professional before taking any of these medicines: -acetaminophen -aspirin -ibuprofen -ketoprofen -naproxen This list may not describe all possible interactions. Give your health care provider a list of all the medicines, herbs, non-prescription drugs, or dietary supplements you use. Also tell them if you smoke, drink alcohol, or use illegal drugs. Some items may interact with your medicine. What should I watch for while using this medicine? Your condition will be monitored carefully while you are receiving this medicine. You will need important blood work done while you are taking this medicine. This drug may make you feel generally unwell. This is not uncommon, as  chemotherapy can affect healthy cells as well as cancer cells. Report any side effects. Continue your course of treatment even though you feel ill  unless your doctor tells you to stop. Your urine may turn orange-red for a few days after your dose. This is not blood. If your urine is dark or brown, call your doctor. In some cases, you may be given additional medicines to help with side effects. Follow all directions for their use. Call your doctor or health care professional for advice if you get a fever (100.5 degrees F or higher), chills or sore throat, or other symptoms of a cold or flu. Do not treat yourself. This drug decreases your body's ability to fight infections. Try to avoid being around people who are sick. This medicine may increase your risk to bruise or bleed. Call your doctor or health care professional if you notice any unusual bleeding. Be careful brushing and flossing your teeth or using a toothpick because you may get an infection or bleed more easily. If you have any dental work done, tell your dentist you are receiving this medicine. Avoid taking products that contain aspirin, acetaminophen, ibuprofen, naproxen, or ketoprofen unless instructed by your doctor. These medicines may hide a fever. Men and women of childbearing age should use effective birth control methods while using taking this medicine. Do not become pregnant while taking this medicine. There is a potential for serious side effects to an unborn child. Talk to your health care professional or pharmacist for more information. Do not breast-feed an infant while taking this medicine. Talk to your doctor about your risk of cancer. You may be more at risk for certain types of cancers if you take this medicine. What side effects may I notice from receiving this medicine? Side effects that you should report to your doctor or health care professional as soon as possible: -allergic reactions like skin rash, itching or hives, swelling of the face, lips, or tongue -low blood counts - this medicine may decrease the number of white blood cells, red blood cells and platelets.  You may be at increased risk for infections and bleeding. -signs of hand-foot syndrome - tingling or burning, redness, flaking, swelling, small blisters, or small sores on the palms of your hands or the soles of your feet -signs of infection - fever or chills, cough, sore throat, pain or difficulty passing urine -signs of decreased platelets or bleeding - bruising, pinpoint red spots on the skin, black, tarry stools, blood in the urine -signs of decreased red blood cells - unusually weak or tired, fainting spells, lightheadedness -back pain, chills, facial flushing, fever, headache, tightness in the chest or throat during the infusion -breathing problems -chest pain -fast, irregular heartbeat -mouth pain, redness, sores -pain, swelling, redness at site where injected -pain, tingling, numbness in the hands or feet -swelling of ankles, feet, or hands -vomiting Side effects that usually do not require medical attention (report to your doctor or health care professional if they continue or are bothersome): -diarrhea -hair loss -loss of appetite -nail discoloration or damage -nausea -red or watery eyes -red colored urine -stomach upset This list may not describe all possible side effects. Call your doctor for medical advice about side effects. You may report side effects to FDA at 1-800-FDA-1088. Where should I keep my medicine? This drug is given in a hospital or clinic and will not be stored at home. NOTE: This sheet is a summary. It may not cover all possible information. If you  have questions about this medicine, talk to your doctor, pharmacist, or health care provider.  2018 Elsevier/Gold Standard (2012-06-22 10:12:56)  Carboplatin injection What is this medicine? CARBOPLATIN (KAR boe pla tin) is a chemotherapy drug. It targets fast dividing cells, like cancer cells, and causes these cells to die. This medicine is used to treat ovarian cancer and many other cancers. This medicine may  be used for other purposes; ask your health care provider or pharmacist if you have questions. COMMON BRAND NAME(S): Paraplatin What should I tell my health care provider before I take this medicine? They need to know if you have any of these conditions: -blood disorders -hearing problems -kidney disease -recent or ongoing radiation therapy -an unusual or allergic reaction to carboplatin, cisplatin, other chemotherapy, other medicines, foods, dyes, or preservatives -pregnant or trying to get pregnant -breast-feeding How should I use this medicine? This drug is usually given as an infusion into a vein. It is administered in a hospital or clinic by a specially trained health care professional. Talk to your pediatrician regarding the use of this medicine in children. Special care may be needed. Overdosage: If you think you have taken too much of this medicine contact a poison control center or emergency room at once. NOTE: This medicine is only for you. Do not share this medicine with others. What if I miss a dose? It is important not to miss a dose. Call your doctor or health care professional if you are unable to keep an appointment. What may interact with this medicine? -medicines for seizures -medicines to increase blood counts like filgrastim, pegfilgrastim, sargramostim -some antibiotics like amikacin, gentamicin, neomycin, streptomycin, tobramycin -vaccines Talk to your doctor or health care professional before taking any of these medicines: -acetaminophen -aspirin -ibuprofen -ketoprofen -naproxen This list may not describe all possible interactions. Give your health care provider a list of all the medicines, herbs, non-prescription drugs, or dietary supplements you use. Also tell them if you smoke, drink alcohol, or use illegal drugs. Some items may interact with your medicine. What should I watch for while using this medicine? Your condition will be monitored carefully while you  are receiving this medicine. You will need important blood work done while you are taking this medicine. This drug may make you feel generally unwell. This is not uncommon, as chemotherapy can affect healthy cells as well as cancer cells. Report any side effects. Continue your course of treatment even though you feel ill unless your doctor tells you to stop. In some cases, you may be given additional medicines to help with side effects. Follow all directions for their use. Call your doctor or health care professional for advice if you get a fever, chills or sore throat, or other symptoms of a cold or flu. Do not treat yourself. This drug decreases your body's ability to fight infections. Try to avoid being around people who are sick. This medicine may increase your risk to bruise or bleed. Call your doctor or health care professional if you notice any unusual bleeding. Be careful brushing and flossing your teeth or using a toothpick because you may get an infection or bleed more easily. If you have any dental work done, tell your dentist you are receiving this medicine. Avoid taking products that contain aspirin, acetaminophen, ibuprofen, naproxen, or ketoprofen unless instructed by your doctor. These medicines may hide a fever. Do not become pregnant while taking this medicine. Women should inform their doctor if they wish to become pregnant or think they  might be pregnant. There is a potential for serious side effects to an unborn child. Talk to your health care professional or pharmacist for more information. Do not breast-feed an infant while taking this medicine. What side effects may I notice from receiving this medicine? Side effects that you should report to your doctor or health care professional as soon as possible: -allergic reactions like skin rash, itching or hives, swelling of the face, lips, or tongue -signs of infection - fever or chills, cough, sore throat, pain or difficulty passing  urine -signs of decreased platelets or bleeding - bruising, pinpoint red spots on the skin, black, tarry stools, nosebleeds -signs of decreased red blood cells - unusually weak or tired, fainting spells, lightheadedness -breathing problems -changes in hearing -changes in vision -chest pain -high blood pressure -low blood counts - This drug may decrease the number of white blood cells, red blood cells and platelets. You may be at increased risk for infections and bleeding. -nausea and vomiting -pain, swelling, redness or irritation at the injection site -pain, tingling, numbness in the hands or feet -problems with balance, talking, walking -trouble passing urine or change in the amount of urine Side effects that usually do not require medical attention (report to your doctor or health care professional if they continue or are bothersome): -hair loss -loss of appetite -metallic taste in the mouth or changes in taste This list may not describe all possible side effects. Call your doctor for medical advice about side effects. You may report side effects to FDA at 1-800-FDA-1088. Where should I keep my medicine? This drug is given in a hospital or clinic and will not be stored at home. NOTE: This sheet is a summary. It may not cover all possible information. If you have questions about this medicine, talk to your doctor, pharmacist, or health care provider.  2018 Elsevier/Gold Standard (2008-01-08 14:38:05)

## 2018-05-21 ENCOUNTER — Telehealth: Payer: Self-pay | Admitting: Oncology

## 2018-05-21 NOTE — Telephone Encounter (Signed)
Jessica Johnston called and said she had a mild headache on Saturday and was wondering if she can take Tylenol. Advised her that she can take Tylenol PRN.  She also said she has started taking Miralax and has had small bowel movements yesterday and today.  She asked if she should be taking anything else.  Advised that she can take Senakot if she needs to and to follow the package directions.  She verbalized understanding and agreement.

## 2018-05-22 ENCOUNTER — Encounter

## 2018-06-01 DIAGNOSIS — F4322 Adjustment disorder with anxiety: Secondary | ICD-10-CM | POA: Diagnosis not present

## 2018-06-06 ENCOUNTER — Encounter (HOSPITAL_COMMUNITY): Payer: Self-pay | Admitting: Hematology and Oncology

## 2018-06-12 ENCOUNTER — Encounter

## 2018-06-12 NOTE — Telephone Encounter (Signed)
Called and gave patient phone number for [facility]. Referral order faxed.

## 2018-06-12 NOTE — Telephone Encounter (Signed)
Patient called and stated she is still having back pain and would like to be referred to a physical therapist. Please call her back at [phone].

## 2018-06-12 NOTE — Telephone Encounter (Signed)
Refer to Dawn Green

## 2018-06-15 ENCOUNTER — Inpatient Hospital Stay: Payer: Medicare Other

## 2018-06-15 ENCOUNTER — Encounter: Payer: Self-pay | Admitting: Hematology and Oncology

## 2018-06-15 ENCOUNTER — Telehealth: Payer: Self-pay | Admitting: Hematology and Oncology

## 2018-06-15 ENCOUNTER — Inpatient Hospital Stay (HOSPITAL_BASED_OUTPATIENT_CLINIC_OR_DEPARTMENT_OTHER): Payer: Medicare Other | Admitting: Hematology and Oncology

## 2018-06-15 DIAGNOSIS — T451X5A Adverse effect of antineoplastic and immunosuppressive drugs, initial encounter: Secondary | ICD-10-CM

## 2018-06-15 DIAGNOSIS — C5700 Malignant neoplasm of unspecified fallopian tube: Secondary | ICD-10-CM

## 2018-06-15 DIAGNOSIS — C5702 Malignant neoplasm of left fallopian tube: Secondary | ICD-10-CM

## 2018-06-15 DIAGNOSIS — Z5111 Encounter for antineoplastic chemotherapy: Secondary | ICD-10-CM | POA: Diagnosis not present

## 2018-06-15 DIAGNOSIS — D701 Agranulocytosis secondary to cancer chemotherapy: Secondary | ICD-10-CM

## 2018-06-15 DIAGNOSIS — I959 Hypotension, unspecified: Secondary | ICD-10-CM | POA: Diagnosis not present

## 2018-06-15 DIAGNOSIS — I950 Idiopathic hypotension: Secondary | ICD-10-CM

## 2018-06-15 DIAGNOSIS — Z95828 Presence of other vascular implants and grafts: Secondary | ICD-10-CM

## 2018-06-15 LAB — CBC WITH DIFFERENTIAL/PLATELET
BASOS PCT: 1 %
Basophils Absolute: 0 10*3/uL (ref 0.0–0.1)
Eosinophils Absolute: 0 10*3/uL (ref 0.0–0.5)
Eosinophils Relative: 1 %
HCT: 36 % (ref 34.8–46.6)
HEMOGLOBIN: 12.1 g/dL (ref 11.6–15.9)
LYMPHS ABS: 1.1 10*3/uL (ref 0.9–3.3)
Lymphocytes Relative: 36 %
MCH: 30.9 pg (ref 25.1–34.0)
MCHC: 33.6 g/dL (ref 31.5–36.0)
MCV: 92.1 fL (ref 79.5–101.0)
Monocytes Absolute: 0.5 10*3/uL (ref 0.1–0.9)
Monocytes Relative: 16 %
NEUTROS ABS: 1.4 10*3/uL — AB (ref 1.5–6.5)
Neutrophils Relative %: 46 %
Platelets: 194 10*3/uL (ref 145–400)
RBC: 3.91 MIL/uL (ref 3.70–5.45)
RDW: 13.8 % (ref 11.2–14.5)
WBC: 3 10*3/uL — ABNORMAL LOW (ref 3.9–10.3)

## 2018-06-15 LAB — COMPREHENSIVE METABOLIC PANEL
ALBUMIN: 3.8 g/dL (ref 3.5–5.0)
ALT: 30 U/L (ref 0–44)
AST: 18 U/L (ref 15–41)
Alkaline Phosphatase: 74 U/L (ref 38–126)
Anion gap: 8 (ref 5–15)
BUN: 12 mg/dL (ref 8–23)
CO2: 25 mmol/L (ref 22–32)
Calcium: 9.3 mg/dL (ref 8.9–10.3)
Chloride: 106 mmol/L (ref 98–111)
Creatinine, Ser: 0.73 mg/dL (ref 0.44–1.00)
GFR calc non Af Amer: >60 mL/min (ref >60)
GLUCOSE: 86 mg/dL (ref 70–99)
POTASSIUM: 4.4 mmol/L (ref 3.5–5.1)
Sodium: 139 mmol/L (ref 135–145)
Total Bilirubin: 0.3 mg/dL (ref 0.3–1.2)
Total Protein: 6.7 g/dL (ref 6.5–8.1)

## 2018-06-15 MED ORDER — DIPHENHYDRAMINE HCL 25 MG PO CAPS
25.0000 mg | ORAL_CAPSULE | Freq: Once | ORAL | Status: AC
  Administered 2018-06-15: 25 mg via ORAL

## 2018-06-15 MED ORDER — HEPARIN SOD (PORK) LOCK FLUSH 100 UNIT/ML IV SOLN
500.0000 [IU] | Freq: Once | INTRAVENOUS | Status: AC | PRN
  Administered 2018-06-15: 500 [IU]
  Filled 2018-06-15: qty 5

## 2018-06-15 MED ORDER — FAMOTIDINE IN NACL 20-0.9 MG/50ML-% IV SOLN
INTRAVENOUS | Status: AC
  Filled 2018-06-15: qty 50

## 2018-06-15 MED ORDER — DOXORUBICIN HCL LIPOSOMAL CHEMO INJECTION 2 MG/ML
28.0000 mg/m2 | Freq: Once | INTRAVENOUS | Status: AC
  Administered 2018-06-15: 50 mg via INTRAVENOUS
  Filled 2018-06-15: qty 25

## 2018-06-15 MED ORDER — SODIUM CHLORIDE 0.9% FLUSH
10.0000 mL | INTRAVENOUS | Status: DC | PRN
  Administered 2018-06-15: 10 mL
  Filled 2018-06-15: qty 10

## 2018-06-15 MED ORDER — DEXAMETHASONE SODIUM PHOSPHATE 10 MG/ML IJ SOLN
INTRAMUSCULAR | Status: AC
  Filled 2018-06-15: qty 1

## 2018-06-15 MED ORDER — PALONOSETRON HCL INJECTION 0.25 MG/5ML
INTRAVENOUS | Status: AC
  Filled 2018-06-15: qty 5

## 2018-06-15 MED ORDER — FAMOTIDINE IN NACL 20-0.9 MG/50ML-% IV SOLN
20.0000 mg | Freq: Once | INTRAVENOUS | Status: AC
  Administered 2018-06-15: 20 mg via INTRAVENOUS

## 2018-06-15 MED ORDER — DIPHENHYDRAMINE HCL 25 MG PO CAPS
ORAL_CAPSULE | ORAL | Status: AC
  Filled 2018-06-15: qty 1

## 2018-06-15 MED ORDER — DEXTROSE 5 % IV SOLN
Freq: Once | INTRAVENOUS | Status: AC
  Administered 2018-06-15: 12:00:00 via INTRAVENOUS
  Filled 2018-06-15: qty 250

## 2018-06-15 MED ORDER — DEXAMETHASONE SODIUM PHOSPHATE 10 MG/ML IJ SOLN
10.0000 mg | Freq: Once | INTRAMUSCULAR | Status: AC
  Administered 2018-06-15: 10 mg via INTRAVENOUS

## 2018-06-15 MED ORDER — PALONOSETRON HCL INJECTION 0.25 MG/5ML
0.2500 mg | Freq: Once | INTRAVENOUS | Status: AC
  Administered 2018-06-15: 0.25 mg via INTRAVENOUS

## 2018-06-15 MED ORDER — SODIUM CHLORIDE 0.9 % IV SOLN
389.5000 mg | Freq: Once | INTRAVENOUS | Status: AC
  Administered 2018-06-15: 390 mg via INTRAVENOUS
  Filled 2018-06-15: qty 39

## 2018-06-15 MED ORDER — SODIUM CHLORIDE 0.9% FLUSH
10.0000 mL | Freq: Once | INTRAVENOUS | Status: AC
  Administered 2018-06-15: 10 mL
  Filled 2018-06-15: qty 10

## 2018-06-15 NOTE — Progress Notes (Signed)
OK treat with today's ANC 1.4 per Dr Alvy Bimler

## 2018-06-15 NOTE — Patient Instructions (Signed)
[facility] Discharge Instructions for Patients Receiving Chemotherapy  Today you received the following chemotherapy agents doxil/carboplatin   To help prevent nausea and vomiting after your treatment, we encourage you to take your nausea medication as directed   If you develop nausea and vomiting that is not controlled by your nausea medication, call the clinic.   BELOW ARE SYMPTOMS THAT SHOULD BE REPORTED IMMEDIATELY: *FEVER GREATER THAN 100.5 F *CHILLS WITH OR WITHOUT FEVER NAUSEA AND VOMITING THAT IS NOT CONTROLLED WITH YOUR NAUSEA MEDICATION *UNUSUAL SHORTNESS OF BREATH *UNUSUAL BRUISING OR BLEEDING TENDERNESS IN MOUTH AND THROAT WITH OR WITHOUT PRESENCE OF ULCERS *URINARY PROBLEMS *BOWEL PROBLEMS UNUSUAL RASH Items with * indicate a potential emergency and should be followed up as soon as possible.  Feel free to call the clinic you have any questions or concerns. The clinic phone number is [phone].  

## 2018-06-15 NOTE — Telephone Encounter (Signed)
Gave pt avs and calendar  °

## 2018-06-16 DIAGNOSIS — I959 Hypotension, unspecified: Secondary | ICD-10-CM | POA: Insufficient documentation

## 2018-06-16 LAB — CA 125: Cancer Antigen (CA) 125: 79 U/mL — ABNORMAL HIGH (ref 0.0–38.1)

## 2018-06-16 NOTE — Assessment & Plan Note (Signed)
She tolerated treatment well except for mild neutropenia We will proceed with treatment without dose adjustment Overall, she tolerated therapy without major side effects such as nausea or vomiting Her blood pressure is low likely due to recent weight loss and dietary change Tumor marker is improving I recommend minimum 3 cycles of chemotherapy before repeat imaging study

## 2018-06-16 NOTE — Assessment & Plan Note (Signed)
This is likely due to side effects of treatment She is not symptomatic We will proceed with treatment without dose adjustment We discussed neutropenic precaution If she has recurrence of neutropenia in the next cycle, I will add G-CSF support

## 2018-06-16 NOTE — Progress Notes (Signed)
[place] OFFICE PROGRESS NOTE  Patient Care Team: [name], MD as PCP - General (Internal Medicine)  ASSESSMENT & PLAN:  Fallopian tube cancer, carcinoma ([place]) She tolerated treatment well except for mild neutropenia We will proceed with treatment without dose adjustment Overall, she tolerated therapy without major side effects such as nausea or vomiting Her blood pressure is low likely due to recent weight loss and dietary change Tumor marker is improving I recommend minimum 3 cycles of chemotherapy before repeat imaging study  Chemotherapy induced neutropenia ([place]) This is likely due to side effects of treatment She is not symptomatic We will proceed with treatment without dose adjustment We discussed neutropenic precaution If she has recurrence of neutropenia in the next cycle, I will add G-CSF support  Hypotension She has mild hypotension but not symptomatic She has lost some weight We discussed importance of nutrition and I recommend her to go on regular diet rather than vegan diet for now.   No orders of the defined types were placed in this encounter.   INTERVAL HISTORY: Please see below for problem oriented charting. She returns for chemotherapy follow-up She tolerated last cycle of therapy well No recent infection, fever or chills No chest pain or shortness of breath Denies recent nausea or constipation.  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 Extensive omental nodularity highly worrisome for peritoneal carcinomatosis. Late  recurrence of melanoma can present as peritoneal carcinomatosis. Ovarian cancer more commonly presents in this manner. Patient does have a predominately low density right adnexal lesion measuring up to 3.5 cm, although this lesion is not typical for ovarian cancer. 2. No evidence of bowel or ureteral obstruction. 3. No other definite signs of metastatic disease. There are small indeterminate low-density hepatic lesions.    02/08/2016 Tumor Marker    Patient's tumor was tested for the following markers: CA-125 Results of the tumor marker test revealed 656.2    02/15/2016 Procedure    CT-guided core biopsy performed of omental mass just deep to the abdominal wall.    02/15/2016 Pathology Results    Omentum, biopsy, left - PAPILLARY SEROUS NEOPLASM, SEE COMMENT. Microscopic Comment There are papillary collections of largely low grade appearing cells with numerous psammoma bodies. Given the limited material it is difficult to distinguish between invasive implants of a serous borderline tumor and serous carcinoma, especially given the low grade appearance.    02/18/2016 Pathology Results    1. Omentum, resection for tumor INVASIVE IMPLANT OF HIGH GRADE SEROUS CARCINOMA 2. Uterus +/- tubes/ovaries, neoplastic HIGH GRADE SEROUS CARCINOMA INVOLVING LEFT TUBAL FIMBRIA SEROUS CARCINOMA WITH PREDOMINANT PSAMMOMA BODIES IMPLANT AT UTERUS SEROSA, BILATERAL FALLOPIAN TUBAL SEROSA, AND ANTERIOR PERITONEAL REFLECTION CERVIX: HISTOLOGICAL UNREMARKABLE ENDOMETRIUM: INACTIVE ENDOMETRIUM MYOMETRIUM: LEIOMYOMA LEFT OVARY: CYSTADENOFIBROMA RIGHT OVARY AND FALLOPIAN TUBE: HISTOLOGICAL UNREMARKABLE Microscopic Comment 2. ONCOLOGY TABLE - FALLOPIAN TUBE 1. Specimen, including laterality: Omentum, uterus, bilateral ovaries and fallopian tubes 2. Procedure: Hysterectomy, bilateral salpingo-oophorectomy and tumor debulking omentectomy 3. Lymph node sampling performed: No 4. Tumor site: uterus serosa, bilateral  fallopian tubal serosa, peritoneal and omentum 5. Tumor location in fallopian tube: Left fallopian tubal fimbria 6. Specimen integrity (intact/ruptured/disrupted): Intact 7. Tumor size (cm): multi focal invasive omentum implant greater than 2 cm, left fallopian tube tumor 0.8 cm. 8. Histologic type: Serous carcinoma 9. Grade: 3 10. Microscopic tumor extension: uterus serosa, bilateral fallopian tubal serosa, peritoneal and omentum 11. Margins: NA 12. Lymph-Vascular invasion: identified 13. Lymph nodes: # examined: 0; # positive: NA 14. TNM: pT3c, pNx 15. FIGO Stage (based on pathologic findings, needs clinical correlation: IIIC 16. Comment: High grade serous carcinoma multifocally and extensively involves left fallopian tubal fimbria, omentum, uterus serosa, bilateral fallopian tubal serosa and peritoneal. At the left fallopian tube there are small foci of serous tubal intraepithelial carcinoma identified, so we conclude the carcinoma is fallopian tube origin    03/09/2016 Tumor Marker    Patient's tumor was tested for the following markers: CA-125 Results of the tumor marker test revealed 154.4    03/15/2016 Procedure    Technically successful right IJ power-injectable port catheter placement. Ready for routine use.    03/18/2016 - 07/13/2016 Chemotherapy    The patient had 6 cycles of carboplatin and taxol    04/14/2016 Tumor Marker    Patient's tumor was tested for the following markers: CA-125 Results of the tumor marker test revealed 36.7    04/25/2016 Genetic Testing    Patient has genetic testing done for germline mutation Results revealed patient has no mutation    04/28/2016 Tumor Marker    Patient's tumor was tested for the following markers: CA-125 Results of the tumor marker test revealed 24.6    06/21/2016 Tumor Marker    Patient's tumor was tested for the following markers: CA-125 Results of the tumor marker test revealed 16.6    08/01/2016 Tumor Marker    Patient's tumor  was tested for the following markers: CA-125 Results of the tumor marker test revealed 17.2    08/05/2016 Imaging    Interval TAH-BSO. No evidence of residual pelvic mass or metastatic disease within the abdomen or pelvis. No other acute findings.     11/04/2016 Tumor Marker    Patient's tumor was tested for the following markers: CA-125 Results of the tumor marker test revealed 13.6    01/20/2017 Tumor Marker    Patient's tumor was tested for the following markers: CA-125 Results of the tumor marker test revealed 13.8    04/14/2017 Tumor Marker    Patient's tumor was tested for the following markers: CA-125 Results of the tumor marker test revealed 16.1    06/13/2017 Imaging    No acute findings.  No mass or hernia identified.  Colonic diverticulosis, without radiographic evidence of diverticulitis.  Aortic atherosclerosis.    07/03/2017 Tumor Marker    Patient's tumor was tested for the following markers: CA-125 Results of the tumor marker test revealed 18.4    09/19/2017 Tumor Marker    Patient's tumor was tested for the following markers: CA-125 Results of the tumor marker test revealed 18.5    01/23/2018 Tumor Marker    Patient's tumor was tested for the following markers: CA-125 Results of the tumor marker test revealed 35.4    01/30/2018 Imaging    1. No evidence of local fallopian tube carcinoma recurrence within the pelvis. Post hysterectomy. 2. No evidence of metastatic peritoneal disease or omental disease. No solid organ metastasis.     03/20/2018 Tumor Marker    Patient's tumor was tested for the following markers: CA-125  Results of the tumor marker test revealed 73    05/02/2018 Tumor Marker    Patient's tumor was tested for the following markers: CA-125 Results of the tumor marker test revealed 127.1    05/08/2018 Imaging    CT abdomen and pelvis 1. 7 cm left subdiaphragmatic collection of loculated fluid versus low-density soft tissue. Given the patient's  history of rising CA 125, metastatic disease considered highly likely.  2. Areas of fluid or recurrent disease identified in the right lower quadrant adjacent to the cecum and along right lower quadrant small bowel loops.    05/14/2018 Tumor Marker    Patient's tumor was tested for the following markers: CA-125 Results of the tumor marker test revealed 166     Genetic Testing    Patient has genetic testing done for ER/PR and MMR. Results revealed patient has the following on 02/18/2016 surgical pathology: ER 20%, PR 0% MMR: normal     Genetic Testing    Patient has genetic testing done for MSI. Results revealed patient has the following: MSI stable     REVIEW OF SYSTEMS:   Constitutional: Denies fevers, chills or abnormal weight loss Eyes: Denies blurriness of vision Ears, nose, mouth, throat, and face: Denies mucositis or sore throat Respiratory: Denies cough, dyspnea or wheezes Cardiovascular: Denies palpitation, chest discomfort or lower extremity swelling Gastrointestinal:  Denies nausea, heartburn or change in bowel habits Skin: Denies abnormal skin rashes Lymphatics: Denies new lymphadenopathy or easy bruising Neurological:Denies numbness, tingling or new weaknesses Behavioral/Psych: Mood is stable, no new changes  All other systems were reviewed with the patient and are negative.  I have reviewed the past medical history, past surgical history, social history and family history with the patient and they are unchanged from previous note.  ALLERGIES:  has No Known Allergies.  MEDICATIONS:  Current Outpatient Medications  Medication Sig Dispense Refill  . Cholecalciferol (VITAMIN D) 2000 units CAPS Take 1 capsule by mouth daily.    Marland Kitchen ezetimibe (ZETIA) 10 MG tablet Take 10 mg by mouth daily.    . ondansetron (ZOFRAN) 8 MG tablet Take 1 tablet (8 mg total) by mouth every 8 (eight) hours as needed for refractory nausea / vomiting. Start on day 3 after chemo. 30 tablet 1  .  prochlorperazine (COMPAZINE) 10 MG tablet Take 1 tablet (10 mg total) by mouth every 6 (six) hours as needed (Nausea or vomiting). 30 tablet 1   No current facility-administered medications for this visit.     PHYSICAL EXAMINATION: ECOG PERFORMANCE STATUS: 1 - Symptomatic but completely ambulatory  Vitals:   06/15/18 1042  BP: 94/66  Pulse: 62  Resp: 16  Temp: 97.8 F (36.6 C)  SpO2: 100%   Filed Weights   06/15/18 1042  Weight: 151 lb 14.4 oz (68.9 kg)    GENERAL:alert, no distress and comfortable SKIN: skin color, texture, turgor are normal, no rashes or significant lesions EYES: normal, Conjunctiva are pink and non-injected, sclera clear OROPHARYNX:no exudate, no erythema and lips, buccal mucosa, and tongue normal  NECK: supple, thyroid normal size, non-tender, without nodularity LYMPH:  no palpable lymphadenopathy in the cervical, axillary or inguinal LUNGS: clear to auscultation and percussion with normal breathing effort HEART: regular rate & rhythm and no murmurs and no lower extremity edema ABDOMEN:abdomen soft, non-tender and normal bowel sounds Musculoskeletal:no cyanosis of digits and no clubbing  NEURO: alert & oriented x 3 with fluent speech, no focal motor/sensory deficits  LABORATORY DATA:  I have reviewed the data  as listed    Component Value Date/Time   NA 139 06/15/2018 0947   NA 139 06/07/2017 1430   K 4.4 06/15/2018 0947   K 3.8 06/07/2017 1430   CL 106 06/15/2018 0947   CO2 25 06/15/2018 0947   CO2 27 06/07/2017 1430   GLUCOSE 86 06/15/2018 0947   GLUCOSE 124 06/07/2017 1430   BUN 12 06/15/2018 0947   BUN 14.0 06/07/2017 1430   CREATININE 0.73 06/15/2018 0947   CREATININE 0.8 06/07/2017 1430   CALCIUM 9.3 06/15/2018 0947   CALCIUM 9.3 06/07/2017 1430   PROT 6.7 06/15/2018 0947   PROT 6.6 09/30/2016 1027   ALBUMIN 3.8 06/15/2018 0947   ALBUMIN 3.5 09/30/2016 1027   AST 18 06/15/2018 0947   AST 18 09/30/2016 1027   ALT 30 06/15/2018 0947    ALT 21 09/30/2016 1027   ALKPHOS 74 06/15/2018 0947   ALKPHOS 77 09/30/2016 1027   BILITOT 0.3 06/15/2018 0947   BILITOT 0.37 09/30/2016 1027   GFRNONAA >60 06/15/2018 0947   GFRAA >60 06/15/2018 0947    No results found for: SPEP, UPEP  Lab Results  Component Value Date   WBC 3.0 (L) 06/15/2018   NEUTROABS 1.4 (L) 06/15/2018   HGB 12.1 06/15/2018   HCT 36.0 06/15/2018   MCV 92.1 06/15/2018   PLT 194 06/15/2018      Chemistry      Component Value Date/Time   NA 139 06/15/2018 0947   NA 139 06/07/2017 1430   K 4.4 06/15/2018 0947   K 3.8 06/07/2017 1430   CL 106 06/15/2018 0947   CO2 25 06/15/2018 0947   CO2 27 06/07/2017 1430   BUN 12 06/15/2018 0947   BUN 14.0 06/07/2017 1430   CREATININE 0.73 06/15/2018 0947   CREATININE 0.8 06/07/2017 1430      Component Value Date/Time   CALCIUM 9.3 06/15/2018 0947   CALCIUM 9.3 06/07/2017 1430   ALKPHOS 74 06/15/2018 0947   ALKPHOS 77 09/30/2016 1027   AST 18 06/15/2018 0947   AST 18 09/30/2016 1027   ALT 30 06/15/2018 0947   ALT 21 09/30/2016 1027   BILITOT 0.3 06/15/2018 0947   BILITOT 0.37 09/30/2016 1027       All questions were answered. The patient knows to call the clinic with any problems, questions or concerns. No barriers to learning was detected.  I spent 15 minutes counseling the patient face to face. The total time spent in the appointment was 20 minutes and more than 50% was on counseling and review of test results  [name], MD 06/16/2018 8:29 AM

## 2018-06-16 NOTE — Assessment & Plan Note (Signed)
She has mild hypotension but not symptomatic She has lost some weight We discussed importance of nutrition and I recommend her to go on regular diet rather than vegan diet for now.

## 2018-06-20 ENCOUNTER — Telehealth: Payer: Self-pay | Admitting: Oncology

## 2018-06-20 ENCOUNTER — Other Ambulatory Visit: Payer: Self-pay | Admitting: Hematology and Oncology

## 2018-06-20 NOTE — Telephone Encounter (Signed)
Jessica Johnston called and asked for her CA 125 results.  She also said her son is going to have open heart surgery at the [facility] on 06/29/18 and is wondering if it is OK to go.  She said her WBC's were borderline low so she did not have the G-CSF injection.  She is wondering if she should have it now if she does go to her son's surgery.

## 2018-06-20 NOTE — Telephone Encounter (Signed)
I was prescribing Granix due to her not minding to drive If she wants to leave next week, then it might be preferable to prescribe Neulasta/Udenyca since it is long acting I will prescribe it and ask if we can get it approved ASAP. If so, we will schedule the injection

## 2018-06-20 NOTE — Telephone Encounter (Signed)
Called Jessica Johnston back and notified her of CA 125 results.  Also let her know that Dr. Alvy Bimler is waiting for insurance approval for Neulasta/Udenyca and then will schedule an injection before she goes.  She verbalized understanding and said they are leaving on Wednesday, 06/27/18.

## 2018-06-21 ENCOUNTER — Telehealth: Payer: Self-pay | Admitting: Hematology and Oncology

## 2018-06-21 NOTE — Telephone Encounter (Signed)
Called regarding 9/5 sch msg °

## 2018-06-21 NOTE — Telephone Encounter (Signed)
Called patient to make sure she is aware of injection appointment for tomorrow.  She said she has talked to the scheduler and does know about the appointment.

## 2018-06-22 ENCOUNTER — Inpatient Hospital Stay: Payer: Medicare Other | Attending: Gynecologic Oncology

## 2018-06-22 VITALS — BP 120/78 | HR 77 | Temp 97.9°F | Resp 18

## 2018-06-22 DIAGNOSIS — Z23 Encounter for immunization: Secondary | ICD-10-CM | POA: Insufficient documentation

## 2018-06-22 DIAGNOSIS — Z79899 Other long term (current) drug therapy: Secondary | ICD-10-CM | POA: Diagnosis not present

## 2018-06-22 DIAGNOSIS — C5702 Malignant neoplasm of left fallopian tube: Secondary | ICD-10-CM | POA: Diagnosis not present

## 2018-06-22 DIAGNOSIS — Z5111 Encounter for antineoplastic chemotherapy: Secondary | ICD-10-CM | POA: Insufficient documentation

## 2018-06-22 DIAGNOSIS — Z95828 Presence of other vascular implants and grafts: Secondary | ICD-10-CM

## 2018-06-22 MED ORDER — PEGFILGRASTIM-CBQV 6 MG/0.6ML ~~LOC~~ SOSY
6.0000 mg | PREFILLED_SYRINGE | Freq: Once | SUBCUTANEOUS | Status: AC
  Administered 2018-06-22: 6 mg via SUBCUTANEOUS

## 2018-06-22 MED ORDER — PEGFILGRASTIM-CBQV 6 MG/0.6ML ~~LOC~~ SOSY
PREFILLED_SYRINGE | SUBCUTANEOUS | Status: AC
  Filled 2018-06-22: qty 0.6

## 2018-06-22 NOTE — Patient Instructions (Signed)
Pegfilgrastim injection What is this medicine? PEGFILGRASTIM (PEG fil gra stim) is a long-acting granulocyte colony-stimulating factor that stimulates the growth of neutrophils, a type of white blood cell important in the body's fight against infection. It is used to reduce the incidence of fever and infection in patients with certain types of cancer who are receiving chemotherapy that affects the bone marrow, and to increase survival after being exposed to high doses of radiation. This medicine may be used for other purposes; ask your health care provider or pharmacist if you have questions. COMMON BRAND NAME(S): Neulasta What should I tell my health care provider before I take this medicine? They need to know if you have any of these conditions: -kidney disease -latex allergy -ongoing radiation therapy -sickle cell disease -skin reactions to acrylic adhesives (On-Body Injector only) -an unusual or allergic reaction to pegfilgrastim, filgrastim, other medicines, foods, dyes, or preservatives -pregnant or trying to get pregnant -breast-feeding How should I use this medicine? This medicine is for injection under the skin. If you get this medicine at home, you will be taught how to prepare and give the pre-filled syringe or how to use the On-body Injector. Refer to the patient Instructions for Use for detailed instructions. Use exactly as directed. Tell your healthcare provider immediately if you suspect that the On-body Injector may not have performed as intended or if you suspect the use of the On-body Injector resulted in a missed or partial dose. It is important that you put your used needles and syringes in a special sharps container. Do not put them in a trash can. If you do not have a sharps container, call your pharmacist or healthcare provider to get one. Talk to your pediatrician regarding the use of this medicine in children. While this drug may be prescribed for selected conditions,  precautions do apply. Overdosage: If you think you have taken too much of this medicine contact a poison control center or emergency room at once. NOTE: This medicine is only for you. Do not share this medicine with others. What if I miss a dose? It is important not to miss your dose. Call your doctor or health care professional if you miss your dose. If you miss a dose due to an On-body Injector failure or leakage, a new dose should be administered as soon as possible using a single prefilled syringe for manual use. What may interact with this medicine? Interactions have not been studied. Give your health care provider a list of all the medicines, herbs, non-prescription drugs, or dietary supplements you use. Also tell them if you smoke, drink alcohol, or use illegal drugs. Some items may interact with your medicine. This list may not describe all possible interactions. Give your health care provider a list of all the medicines, herbs, non-prescription drugs, or dietary supplements you use. Also tell them if you smoke, drink alcohol, or use illegal drugs. Some items may interact with your medicine. What should I watch for while using this medicine? You may need blood work done while you are taking this medicine. If you are going to need a MRI, CT scan, or other procedure, tell your doctor that you are using this medicine (On-Body Injector only). What side effects may I notice from receiving this medicine? Side effects that you should report to your doctor or health care professional as soon as possible: -allergic reactions like skin rash, itching or hives, swelling of the face, lips, or tongue -dizziness -fever -pain, redness, or irritation at site   where injected -pinpoint red spots on the skin -red or dark-brown urine -shortness of breath or breathing problems -stomach or side pain, or pain at the shoulder -swelling -tiredness -trouble passing urine or change in the amount of urine Side  effects that usually do not require medical attention (report to your doctor or health care professional if they continue or are bothersome): -bone pain -muscle pain This list may not describe all possible side effects. Call your doctor for medical advice about side effects. You may report side effects to FDA at 1-800-FDA-1088. Where should I keep my medicine? Keep out of the reach of children. Store pre-filled syringes in a refrigerator between 2 and 8 degrees C (36 and 46 degrees F). Do not freeze. Keep in carton to protect from light. Throw away this medicine if it is left out of the refrigerator for more than 48 hours. Throw away any unused medicine after the expiration date. NOTE: This sheet is a summary. It may not cover all possible information. If you have questions about this medicine, talk to your doctor, pharmacist, or health care provider.  2018 Elsevier/Gold Standard (2016-09-29 12:58:03)  

## 2018-06-29 MED ORDER — MUPIROCIN 2 % OINTMENT
2 % | Freq: Four times a day (QID) | CUTANEOUS | 0 refills | Status: DC
Start: 2018-06-29 — End: 2021-04-01

## 2018-07-12 ENCOUNTER — Telehealth: Payer: Self-pay | Admitting: Oncology

## 2018-07-12 NOTE — Telephone Encounter (Signed)
Avoid all creams Let it scab if possible Cover with Bandaid if she leaves the house Take pictures

## 2018-07-12 NOTE — Telephone Encounter (Signed)
Norris called and said she has had a red spot on her upper leg for about 2 1/2 weeks.  She said it is a little larger than a pimple and it itches.  She has been applying neosporin to the area but has noticed that she is getting a few more red spots on her lower legs.  She also thinks she has one on her middle back.  She would like to let Dr. Alvy Bimler know.  She also said a  friend recommended using tacrolimus ointment and is wondering if this would be good to use.

## 2018-07-12 NOTE — Telephone Encounter (Signed)
Called Airanna back and advised her of Dr. Calton Dach message.  She verbalized understanding and agreement.

## 2018-07-13 ENCOUNTER — Telehealth: Payer: Self-pay | Admitting: Oncology

## 2018-07-13 NOTE — Telephone Encounter (Signed)
[facility] and let her know that Dr. Alvy Bimler has reviewed her pictures and think the spots are seborrheic keratosis that may have flared up from chemo.  Jessica Johnston verbalized agreement and said they actually look better today.

## 2018-07-16 ENCOUNTER — Inpatient Hospital Stay: Payer: Medicare Other

## 2018-07-16 ENCOUNTER — Inpatient Hospital Stay (HOSPITAL_BASED_OUTPATIENT_CLINIC_OR_DEPARTMENT_OTHER): Payer: Medicare Other | Admitting: Hematology and Oncology

## 2018-07-16 ENCOUNTER — Other Ambulatory Visit: Payer: Self-pay | Admitting: Hematology and Oncology

## 2018-07-16 VITALS — BP 125/59 | HR 64 | Temp 97.6°F | Resp 18 | Ht 64.0 in | Wt 150.4 lb

## 2018-07-16 DIAGNOSIS — Z23 Encounter for immunization: Secondary | ICD-10-CM | POA: Diagnosis not present

## 2018-07-16 DIAGNOSIS — I950 Idiopathic hypotension: Secondary | ICD-10-CM | POA: Diagnosis not present

## 2018-07-16 DIAGNOSIS — Z95828 Presence of other vascular implants and grafts: Secondary | ICD-10-CM

## 2018-07-16 DIAGNOSIS — C5702 Malignant neoplasm of left fallopian tube: Secondary | ICD-10-CM

## 2018-07-16 DIAGNOSIS — C5700 Malignant neoplasm of unspecified fallopian tube: Secondary | ICD-10-CM

## 2018-07-16 DIAGNOSIS — Z5111 Encounter for antineoplastic chemotherapy: Secondary | ICD-10-CM

## 2018-07-16 DIAGNOSIS — Z79899 Other long term (current) drug therapy: Secondary | ICD-10-CM | POA: Diagnosis not present

## 2018-07-16 LAB — CBC WITH DIFFERENTIAL/PLATELET
Basophils Absolute: 0 10*3/uL (ref 0.0–0.1)
Basophils Relative: 1 %
EOS ABS: 0 10*3/uL (ref 0.0–0.5)
EOS PCT: 1 %
HCT: 33.1 % — ABNORMAL LOW (ref 34.8–46.6)
HEMOGLOBIN: 11.6 g/dL (ref 11.6–15.9)
LYMPHS ABS: 1 10*3/uL (ref 0.9–3.3)
LYMPHS PCT: 23 %
MCH: 32.8 pg (ref 25.1–34.0)
MCHC: 35 g/dL (ref 31.5–36.0)
MCV: 93.6 fL (ref 79.5–101.0)
MONOS PCT: 15 %
Monocytes Absolute: 0.6 10*3/uL (ref 0.1–0.9)
Neutro Abs: 2.7 10*3/uL (ref 1.5–6.5)
Neutrophils Relative %: 60 %
PLATELETS: 306 10*3/uL (ref 145–400)
RBC: 3.54 MIL/uL — AB (ref 3.70–5.45)
RDW: 16.2 % — ABNORMAL HIGH (ref 11.2–14.5)
WBC: 4.4 10*3/uL (ref 3.9–10.3)

## 2018-07-16 LAB — COMPREHENSIVE METABOLIC PANEL
ALK PHOS: 80 U/L (ref 38–126)
ALT: 28 U/L (ref 0–44)
ANION GAP: 7 (ref 5–15)
AST: 18 U/L (ref 15–41)
Albumin: 3.6 g/dL (ref 3.5–5.0)
BUN: 11 mg/dL (ref 8–23)
CALCIUM: 9.2 mg/dL (ref 8.9–10.3)
CO2: 26 mmol/L (ref 22–32)
CREATININE: 0.7 mg/dL (ref 0.44–1.00)
Chloride: 107 mmol/L (ref 98–111)
Glucose, Bld: 96 mg/dL (ref 70–99)
Potassium: 4.3 mmol/L (ref 3.5–5.1)
SODIUM: 140 mmol/L (ref 135–145)
Total Bilirubin: 0.3 mg/dL (ref 0.3–1.2)
Total Protein: 6.6 g/dL (ref 6.5–8.1)

## 2018-07-16 MED ORDER — DIPHENHYDRAMINE HCL 25 MG PO CAPS
25.0000 mg | ORAL_CAPSULE | Freq: Once | ORAL | Status: AC
  Administered 2018-07-16: 25 mg via ORAL

## 2018-07-16 MED ORDER — SODIUM CHLORIDE 0.9% FLUSH
10.0000 mL | INTRAVENOUS | Status: DC | PRN
  Administered 2018-07-16: 10 mL
  Filled 2018-07-16: qty 10

## 2018-07-16 MED ORDER — DIPHENHYDRAMINE HCL 25 MG PO CAPS
ORAL_CAPSULE | ORAL | Status: AC
  Filled 2018-07-16: qty 1

## 2018-07-16 MED ORDER — DEXTROSE 5 % IV SOLN
Freq: Once | INTRAVENOUS | Status: AC
  Administered 2018-07-16: 11:00:00 via INTRAVENOUS
  Filled 2018-07-16: qty 250

## 2018-07-16 MED ORDER — PALONOSETRON HCL INJECTION 0.25 MG/5ML
INTRAVENOUS | Status: AC
  Filled 2018-07-16: qty 5

## 2018-07-16 MED ORDER — INFLUENZA VAC SPLIT QUAD 0.5 ML IM SUSY
PREFILLED_SYRINGE | INTRAMUSCULAR | Status: AC
  Filled 2018-07-16: qty 0.5

## 2018-07-16 MED ORDER — SODIUM CHLORIDE 0.9% FLUSH
10.0000 mL | Freq: Once | INTRAVENOUS | Status: AC
  Administered 2018-07-16: 10 mL
  Filled 2018-07-16: qty 10

## 2018-07-16 MED ORDER — SODIUM CHLORIDE 0.9 % IV SOLN
389.5000 mg | Freq: Once | INTRAVENOUS | Status: AC
  Administered 2018-07-16: 390 mg via INTRAVENOUS
  Filled 2018-07-16: qty 39

## 2018-07-16 MED ORDER — DOXORUBICIN HCL LIPOSOMAL CHEMO INJECTION 2 MG/ML
28.0000 mg/m2 | Freq: Once | INTRAVENOUS | Status: AC
  Administered 2018-07-16: 50 mg via INTRAVENOUS
  Filled 2018-07-16: qty 25

## 2018-07-16 MED ORDER — FAMOTIDINE IN NACL 20-0.9 MG/50ML-% IV SOLN
20.0000 mg | Freq: Once | INTRAVENOUS | Status: AC
  Administered 2018-07-16: 20 mg via INTRAVENOUS

## 2018-07-16 MED ORDER — DEXAMETHASONE SODIUM PHOSPHATE 10 MG/ML IJ SOLN
INTRAMUSCULAR | Status: AC
  Filled 2018-07-16: qty 1

## 2018-07-16 MED ORDER — INFLUENZA VAC SPLIT QUAD 0.5 ML IM SUSY
0.5000 mL | PREFILLED_SYRINGE | Freq: Once | INTRAMUSCULAR | Status: AC
  Administered 2018-07-16: 0.5 mL via INTRAMUSCULAR

## 2018-07-16 MED ORDER — PALONOSETRON HCL INJECTION 0.25 MG/5ML
0.2500 mg | Freq: Once | INTRAVENOUS | Status: AC
  Administered 2018-07-16: 0.25 mg via INTRAVENOUS

## 2018-07-16 MED ORDER — FAMOTIDINE IN NACL 20-0.9 MG/50ML-% IV SOLN
INTRAVENOUS | Status: AC
  Filled 2018-07-16: qty 50

## 2018-07-16 MED ORDER — HEPARIN SOD (PORK) LOCK FLUSH 100 UNIT/ML IV SOLN
500.0000 [IU] | Freq: Once | INTRAVENOUS | Status: AC | PRN
  Administered 2018-07-16: 500 [IU]
  Filled 2018-07-16: qty 5

## 2018-07-16 MED ORDER — DEXAMETHASONE SODIUM PHOSPHATE 10 MG/ML IJ SOLN
10.0000 mg | Freq: Once | INTRAMUSCULAR | Status: AC
  Administered 2018-07-16: 10 mg via INTRAVENOUS

## 2018-07-16 NOTE — Patient Instructions (Signed)
[facility] Discharge Instructions for Patients Receiving Chemotherapy  Today you received the following chemotherapy agents: Doxil/ Carboplatin.  To help prevent nausea and vomiting after your treatment, we encourage you to take your nausea medication as directed  If you develop nausea and vomiting that is not controlled by your nausea medication, call the clinic.   BELOW ARE SYMPTOMS THAT SHOULD BE REPORTED IMMEDIATELY:  *FEVER GREATER THAN 100.5 F  *CHILLS WITH OR WITHOUT FEVER  NAUSEA AND VOMITING THAT IS NOT CONTROLLED WITH YOUR NAUSEA MEDICATION  *UNUSUAL SHORTNESS OF BREATH  *UNUSUAL BRUISING OR BLEEDING  TENDERNESS IN MOUTH AND THROAT WITH OR WITHOUT PRESENCE OF ULCERS  *URINARY PROBLEMS  *BOWEL PROBLEMS  UNUSUAL RASH Items with * indicate a potential emergency and should be followed up as soon as possible.  Feel free to call the clinic you have any questions or concerns. The clinic phone number is [phone].

## 2018-07-17 ENCOUNTER — Telehealth: Payer: Self-pay

## 2018-07-17 ENCOUNTER — Encounter: Payer: Self-pay | Admitting: Hematology and Oncology

## 2018-07-17 LAB — CA 125: Cancer Antigen (CA) 125: 28.7 U/mL (ref 0.0–38.1)

## 2018-07-17 NOTE — Progress Notes (Signed)
[place] OFFICE PROGRESS NOTE  Patient Care Team: [name], MD as PCP - General (Internal Medicine)  ASSESSMENT & PLAN:  Fallopian tube cancer, carcinoma ([place]) She tolerated treatment well except for recent mild neutropenia We will proceed with treatment without dose adjustment Tumor marker is improving I recommend minimum 3 cycles of chemotherapy before repeat imaging study I do not plan for prophylactic G-CSF support due to lack of symptom We discussed the importance of preventive care and reviewed the vaccination programs. She does not have any prior allergic reactions to influenza vaccination. She agrees to proceed with influenza vaccination today and we will administer it today at the clinic.   Hypotension She has mild intermittent hypotension but remained asymptomatic She has no signs or symptoms of congestive heart failure I plan to repeat echocardiogram next month.   Orders Placed This Encounter  Procedures  . ECHOCARDIOGRAM COMPLETE    Standing Status:   Future    Standing Expiration Date:   10/18/2019    Order Specific Question:   Where should this test be performed    Answer:   [facility]    Order Specific Question:   Perflutren DEFINITY (image enhancing agent) should be administered unless hypersensitivity or allergy exist    Answer:   Administer Perflutren    INTERVAL HISTORY: Please see below for problem oriented charting. She returns for further follow-up She tolerated treatment well No recent nausea, vomiting or infection Denies recent chest pain or shortness of breath Appetite is stable although she has lost some weight.   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 Extensive omental nodularity highly worrisome for peritoneal carcinomatosis. Late recurrence of melanoma can present as peritoneal carcinomatosis. Ovarian cancer more commonly presents in this manner. Patient does have a predominately low density right adnexal lesion measuring up to 3.5 cm, although this lesion is not typical for ovarian cancer. 2. No evidence of bowel or ureteral obstruction. 3. No other definite signs of metastatic disease. There are small indeterminate low-density hepatic lesions.    02/08/2016 Tumor Marker    Patient's tumor was tested for the following markers: CA-125 Results of the tumor marker test revealed 656.2    02/15/2016 Procedure    CT-guided core biopsy performed of omental mass just deep to the abdominal wall.    02/15/2016 Pathology Results    Omentum, biopsy, left - PAPILLARY SEROUS NEOPLASM, SEE COMMENT. Microscopic Comment There are papillary collections of largely low grade appearing cells with numerous psammoma bodies. Given the limited material it is difficult to distinguish between invasive implants of a serous borderline tumor and serous carcinoma, especially given the low grade appearance.    02/18/2016 Pathology Results    1. Omentum, resection for tumor INVASIVE IMPLANT OF HIGH GRADE SEROUS CARCINOMA 2. Uterus +/- tubes/ovaries, neoplastic HIGH GRADE SEROUS CARCINOMA INVOLVING LEFT TUBAL FIMBRIA SEROUS CARCINOMA WITH PREDOMINANT PSAMMOMA BODIES IMPLANT AT UTERUS SEROSA, BILATERAL FALLOPIAN TUBAL SEROSA, AND ANTERIOR PERITONEAL REFLECTION CERVIX: HISTOLOGICAL UNREMARKABLE ENDOMETRIUM: INACTIVE ENDOMETRIUM MYOMETRIUM: LEIOMYOMA LEFT OVARY: CYSTADENOFIBROMA RIGHT OVARY AND FALLOPIAN TUBE: HISTOLOGICAL UNREMARKABLE Microscopic Comment 2. ONCOLOGY TABLE - FALLOPIAN TUBE  1. Specimen, including laterality: Omentum, uterus, bilateral ovaries and fallopian tubes 2. Procedure: Hysterectomy,  bilateral salpingo-oophorectomy and tumor debulking omentectomy 3. Lymph node sampling performed: No 4. Tumor site: uterus serosa, bilateral fallopian tubal serosa, peritoneal and omentum 5. Tumor location in fallopian tube: Left fallopian tubal fimbria 6. Specimen integrity (intact/ruptured/disrupted): Intact 7. Tumor size (cm): multi focal invasive omentum implant greater than 2 cm, left fallopian tube tumor 0.8 cm. 8. Histologic type: Serous carcinoma 9. Grade: 3 10. Microscopic tumor extension: uterus serosa, bilateral fallopian tubal serosa, peritoneal and omentum 11. Margins: NA 12. Lymph-Vascular invasion: identified 13. Lymph nodes: # examined: 0; # positive: NA 14. TNM: pT3c, pNx 15. FIGO Stage (based on pathologic findings, needs clinical correlation: IIIC 16. Comment: High grade serous carcinoma multifocally and extensively involves left fallopian tubal fimbria, omentum, uterus serosa, bilateral fallopian tubal serosa and peritoneal. At the left fallopian tube there are small foci of serous tubal intraepithelial carcinoma identified, so we conclude the carcinoma is fallopian tube origin    03/09/2016 Tumor Marker    Patient's tumor was tested for the following markers: CA-125 Results of the tumor marker test revealed 154.4    03/15/2016 Procedure    Technically successful right IJ power-injectable port catheter placement. Ready for routine use.    03/18/2016 - 07/13/2016 Chemotherapy    The patient had 6 cycles of carboplatin and taxol    04/14/2016 Tumor Marker    Patient's tumor was tested for the following markers: CA-125 Results of the tumor marker test revealed 36.7    04/25/2016 Genetic Testing    Patient has genetic testing done for germline mutation Results revealed patient has no mutation    04/28/2016 Tumor Marker    Patient's tumor was tested for the following markers: CA-125 Results of the tumor marker test revealed 24.6    06/21/2016 Tumor Marker    Patient's  tumor was tested for the following markers: CA-125 Results of the tumor marker test revealed 16.6    08/01/2016 Tumor Marker    Patient's tumor was tested for the following markers: CA-125 Results of the tumor marker test revealed 17.2    08/05/2016 Imaging    Interval TAH-BSO. No evidence of residual pelvic mass or metastatic disease within the abdomen or pelvis. No other acute findings.     11/04/2016 Tumor Marker    Patient's tumor was tested for the following markers: CA-125 Results of the tumor marker test revealed 13.6    01/20/2017 Tumor Marker    Patient's tumor was tested for the following markers: CA-125 Results of the tumor marker test revealed 13.8    04/14/2017 Tumor Marker    Patient's tumor was tested for the following markers: CA-125 Results of the tumor marker test revealed 16.1    06/13/2017 Imaging    No acute findings.  No mass or hernia identified.  Colonic diverticulosis, without radiographic evidence of diverticulitis.  Aortic atherosclerosis.    07/03/2017 Tumor Marker    Patient's tumor was tested for the following markers: CA-125 Results of the tumor marker test revealed 18.4    09/19/2017 Tumor Marker    Patient's tumor was tested for the following markers: CA-125 Results of the tumor marker test revealed 18.5    01/23/2018 Tumor Marker    Patient's tumor was tested for the following markers: CA-125 Results of the tumor marker test revealed 35.4    01/30/2018 Imaging    1. No evidence of local fallopian tube carcinoma recurrence within the pelvis. Post hysterectomy.  2. No evidence of metastatic peritoneal disease or omental disease. No solid organ metastasis.     03/20/2018 Tumor Marker    Patient's tumor was tested for the following markers: CA-125 Results of the tumor marker test revealed 73    05/02/2018 Tumor Marker    Patient's tumor was tested for the following markers: CA-125 Results of the tumor marker test revealed 127.1    05/08/2018  Imaging    CT abdomen and pelvis 1. 7 cm left subdiaphragmatic collection of loculated fluid versus low-density soft tissue. Given the patient's history of rising CA 125, metastatic disease considered highly likely.  2. Areas of fluid or recurrent disease identified in the right lower quadrant adjacent to the cecum and along right lower quadrant small bowel loops.    05/14/2018 Tumor Marker    Patient's tumor was tested for the following markers: CA-125 Results of the tumor marker test revealed 166     Genetic Testing    Patient has genetic testing done for ER/PR and MMR. Results revealed patient has the following on 02/18/2016 surgical pathology: ER 20%, PR 0% MMR: normal     Genetic Testing    Patient has genetic testing done for MSI. Results revealed patient has the following: MSI stable    07/16/2018 Tumor Marker    Patient's tumor was tested for the following markers: CA-125 Results of the tumor marker test revealed 28.7     REVIEW OF SYSTEMS:   Constitutional: Denies fevers, chills or abnormal weight loss Eyes: Denies blurriness of vision Ears, nose, mouth, throat, and face: Denies mucositis or sore throat Respiratory: Denies cough, dyspnea or wheezes Cardiovascular: Denies palpitation, chest discomfort or lower extremity swelling Gastrointestinal:  Denies nausea, heartburn or change in bowel habits Skin: Denies abnormal skin rashes Lymphatics: Denies new lymphadenopathy or easy bruising Neurological:Denies numbness, tingling or new weaknesses Behavioral/Psych: Mood is stable, no new changes  All other systems were reviewed with the patient and are negative.  I have reviewed the past medical history, past surgical history, social history and family history with the patient and they are unchanged from previous note.  ALLERGIES:  has No Known Allergies.  MEDICATIONS:  Current Outpatient Medications  Medication Sig Dispense Refill  . Cholecalciferol (VITAMIN D) 2000  units CAPS Take 1 capsule by mouth daily.    Marland Kitchen ezetimibe (ZETIA) 10 MG tablet Take 10 mg by mouth daily.    . ondansetron (ZOFRAN) 8 MG tablet Take 1 tablet (8 mg total) by mouth every 8 (eight) hours as needed for refractory nausea / vomiting. Start on day 3 after chemo. 30 tablet 1  . prochlorperazine (COMPAZINE) 10 MG tablet Take 1 tablet (10 mg total) by mouth every 6 (six) hours as needed (Nausea or vomiting). 30 tablet 1   No current facility-administered medications for this visit.     PHYSICAL EXAMINATION: ECOG PERFORMANCE STATUS: 1 - Symptomatic but completely ambulatory  Vitals:   07/16/18 0917  BP: (!) 125/59  Pulse: 64  Resp: 18  Temp: 97.6 F (36.4 C)  SpO2: 100%   Filed Weights   07/16/18 0917  Weight: 150 lb 6.4 oz (68.2 kg)    GENERAL:alert, no distress and comfortable SKIN: skin color, texture, turgor are normal, no rashes or significant lesions EYES: normal, Conjunctiva are pink and non-injected, sclera clear OROPHARYNX:no exudate, no erythema and lips, buccal mucosa, and tongue normal  NECK: supple, thyroid normal size, non-tender, without nodularity LYMPH:  no palpable lymphadenopathy in the cervical, axillary or inguinal  LUNGS: clear to auscultation and percussion with normal breathing effort HEART: regular rate & rhythm and no murmurs and no lower extremity edema ABDOMEN:abdomen soft, non-tender and normal bowel sounds Musculoskeletal:no cyanosis of digits and no clubbing  NEURO: alert & oriented x 3 with fluent speech, no focal motor/sensory deficits  LABORATORY DATA:  I have reviewed the data as listed    Component Value Date/Time   NA 140 07/16/2018 0835   NA 139 06/07/2017 1430   K 4.3 07/16/2018 0835   K 3.8 06/07/2017 1430   CL 107 07/16/2018 0835   CO2 26 07/16/2018 0835   CO2 27 06/07/2017 1430   GLUCOSE 96 07/16/2018 0835   GLUCOSE 124 06/07/2017 1430   BUN 11 07/16/2018 0835   BUN 14.0 06/07/2017 1430   CREATININE 0.70 07/16/2018  0835   CREATININE 0.8 06/07/2017 1430   CALCIUM 9.2 07/16/2018 0835   CALCIUM 9.3 06/07/2017 1430   PROT 6.6 07/16/2018 0835   PROT 6.6 09/30/2016 1027   ALBUMIN 3.6 07/16/2018 0835   ALBUMIN 3.5 09/30/2016 1027   AST 18 07/16/2018 0835   AST 18 09/30/2016 1027   ALT 28 07/16/2018 0835   ALT 21 09/30/2016 1027   ALKPHOS 80 07/16/2018 0835   ALKPHOS 77 09/30/2016 1027   BILITOT 0.3 07/16/2018 0835   BILITOT 0.37 09/30/2016 1027   GFRNONAA >60 07/16/2018 0835   GFRAA >60 07/16/2018 0835    No results found for: SPEP, UPEP  Lab Results  Component Value Date   WBC 4.4 07/16/2018   NEUTROABS 2.7 07/16/2018   HGB 11.6 07/16/2018   HCT 33.1 (L) 07/16/2018   MCV 93.6 07/16/2018   PLT 306 07/16/2018      Chemistry      Component Value Date/Time   NA 140 07/16/2018 0835   NA 139 06/07/2017 1430   K 4.3 07/16/2018 0835   K 3.8 06/07/2017 1430   CL 107 07/16/2018 0835   CO2 26 07/16/2018 0835   CO2 27 06/07/2017 1430   BUN 11 07/16/2018 0835   BUN 14.0 06/07/2017 1430   CREATININE 0.70 07/16/2018 0835   CREATININE 0.8 06/07/2017 1430      Component Value Date/Time   CALCIUM 9.2 07/16/2018 0835   CALCIUM 9.3 06/07/2017 1430   ALKPHOS 80 07/16/2018 0835   ALKPHOS 77 09/30/2016 1027   AST 18 07/16/2018 0835   AST 18 09/30/2016 1027   ALT 28 07/16/2018 0835   ALT 21 09/30/2016 1027   BILITOT 0.3 07/16/2018 0835   BILITOT 0.37 09/30/2016 1027       All questions were answered. The patient knows to call the clinic with any problems, questions or concerns. No barriers to learning was detected.  I spent 15 minutes counseling the patient face to face. The total time spent in the appointment was 20 minutes and more than 50% was on counseling and review of test results  [name], MD 07/17/2018 2:03 PM

## 2018-07-17 NOTE — Assessment & Plan Note (Signed)
She tolerated treatment well except for recent mild neutropenia We will proceed with treatment without dose adjustment Tumor marker is improving I recommend minimum 3 cycles of chemotherapy before repeat imaging study I do not plan for prophylactic G-CSF support due to lack of symptom We discussed the importance of preventive care and reviewed the vaccination programs. She does not have any prior allergic reactions to influenza vaccination. She agrees to proceed with influenza vaccination today and we will administer it today at the clinic.

## 2018-07-17 NOTE — Telephone Encounter (Signed)
Called and left below message. Instructed to call the office for questions. 

## 2018-07-17 NOTE — Assessment & Plan Note (Signed)
She has mild intermittent hypotension but remained asymptomatic She has no signs or symptoms of congestive heart failure I plan to repeat echocardiogram next month.

## 2018-07-17 NOTE — Telephone Encounter (Signed)
-----   Message from [name], MD sent at 07/17/2018  7:29 AM EDT ----- Regarding: CA-125 The level is better ----- Message ----- From: Interface, Lab In [place] Sent: 07/16/2018   9:15 AM EDT To: [name], MD

## 2018-07-19 ENCOUNTER — Telehealth: Payer: Self-pay | Admitting: Oncology

## 2018-07-19 NOTE — Telephone Encounter (Signed)
Jessica Johnston called and asked if she can get a copy of her lab results from 07/17/18.  Advised her that we can provide her with a copy.  She said she will pick it up the next time she is here for an appointment.

## 2018-08-06 ENCOUNTER — Telehealth: Payer: Self-pay | Admitting: Oncology

## 2018-08-06 NOTE — Telephone Encounter (Signed)
[facility] and advised her of message from Dr. Alvy Bimler.  She verbalized agreement and understanding.

## 2018-08-06 NOTE — Telephone Encounter (Signed)
1) consider seeing dermatologist 2) topical benadryl/oral benadryl for itching

## 2018-08-06 NOTE — Telephone Encounter (Signed)
Jessica Johnston called and said she has more sores that look exactly like the ones she had before.  She said there are more of them and they itch.  She is wondering if there is anything topical she can use for the itching.

## 2018-08-07 DIAGNOSIS — L570 Actinic keratosis: Secondary | ICD-10-CM | POA: Diagnosis not present

## 2018-08-07 DIAGNOSIS — Z85820 Personal history of malignant melanoma of skin: Secondary | ICD-10-CM | POA: Diagnosis not present

## 2018-08-07 DIAGNOSIS — Z08 Encounter for follow-up examination after completed treatment for malignant neoplasm: Secondary | ICD-10-CM | POA: Diagnosis not present

## 2018-08-10 ENCOUNTER — Ambulatory Visit (HOSPITAL_COMMUNITY)
Admission: RE | Admit: 2018-08-10 | Discharge: 2018-08-10 | Disposition: A | Discharge status: Home / Self Care | Payer: Medicare Other | Source: Ambulatory Visit | Attending: Hematology and Oncology | Admitting: Hematology and Oncology

## 2018-08-10 ENCOUNTER — Encounter (HOSPITAL_COMMUNITY): Payer: Self-pay

## 2018-08-10 DIAGNOSIS — M799 Soft tissue disorder, unspecified: Secondary | ICD-10-CM | POA: Diagnosis not present

## 2018-08-10 DIAGNOSIS — K5730 Diverticulosis of large intestine without perforation or abscess without bleeding: Secondary | ICD-10-CM | POA: Diagnosis not present

## 2018-08-10 DIAGNOSIS — C5702 Malignant neoplasm of left fallopian tube: Secondary | ICD-10-CM | POA: Diagnosis not present

## 2018-08-10 DIAGNOSIS — Z5111 Encounter for antineoplastic chemotherapy: Secondary | ICD-10-CM | POA: Diagnosis not present

## 2018-08-10 DIAGNOSIS — K439 Ventral hernia without obstruction or gangrene: Secondary | ICD-10-CM | POA: Diagnosis not present

## 2018-08-10 DIAGNOSIS — C5700 Malignant neoplasm of unspecified fallopian tube: Secondary | ICD-10-CM

## 2018-08-10 MED ORDER — SODIUM CHLORIDE 0.9 % IJ SOLN
INTRAMUSCULAR | Status: AC
  Filled 2018-08-10: qty 50

## 2018-08-10 MED ORDER — IOHEXOL 300 MG/ML  SOLN
100.0000 mL | Freq: Once | INTRAMUSCULAR | Status: AC | PRN
  Administered 2018-08-10: 100 mL via INTRAVENOUS

## 2018-08-10 NOTE — Telephone Encounter (Signed)
[facility] and advised her that it is OK to use triamcinolone cream per Dr. Alvy Bimler.

## 2018-08-13 ENCOUNTER — Inpatient Hospital Stay: Payer: Medicare Other | Attending: Gynecologic Oncology

## 2018-08-13 ENCOUNTER — Inpatient Hospital Stay: Payer: Medicare Other

## 2018-08-13 ENCOUNTER — Inpatient Hospital Stay (HOSPITAL_BASED_OUTPATIENT_CLINIC_OR_DEPARTMENT_OTHER): Payer: Medicare Other | Admitting: Hematology and Oncology

## 2018-08-13 ENCOUNTER — Encounter: Payer: Self-pay | Admitting: Hematology and Oncology

## 2018-08-13 ENCOUNTER — Telehealth: Payer: Self-pay | Admitting: Hematology and Oncology

## 2018-08-13 DIAGNOSIS — Z79899 Other long term (current) drug therapy: Secondary | ICD-10-CM | POA: Diagnosis not present

## 2018-08-13 DIAGNOSIS — Z95828 Presence of other vascular implants and grafts: Secondary | ICD-10-CM

## 2018-08-13 DIAGNOSIS — C5702 Malignant neoplasm of left fallopian tube: Secondary | ICD-10-CM | POA: Insufficient documentation

## 2018-08-13 DIAGNOSIS — R21 Rash and other nonspecific skin eruption: Secondary | ICD-10-CM

## 2018-08-13 DIAGNOSIS — D701 Agranulocytosis secondary to cancer chemotherapy: Secondary | ICD-10-CM | POA: Diagnosis not present

## 2018-08-13 DIAGNOSIS — C5700 Malignant neoplasm of unspecified fallopian tube: Secondary | ICD-10-CM

## 2018-08-13 DIAGNOSIS — T451X5A Adverse effect of antineoplastic and immunosuppressive drugs, initial encounter: Secondary | ICD-10-CM | POA: Diagnosis not present

## 2018-08-13 DIAGNOSIS — D6481 Anemia due to antineoplastic chemotherapy: Secondary | ICD-10-CM | POA: Diagnosis not present

## 2018-08-13 DIAGNOSIS — Z5111 Encounter for antineoplastic chemotherapy: Secondary | ICD-10-CM | POA: Diagnosis not present

## 2018-08-13 LAB — CBC WITH DIFFERENTIAL/PLATELET
Abs Immature Granulocytes: 0.01 10*3/uL (ref 0.00–0.07)
Basophils Absolute: 0 10*3/uL (ref 0.0–0.1)
Basophils Relative: 0 %
Eosinophils Absolute: 0 10*3/uL (ref 0.0–0.5)
Eosinophils Relative: 1 %
HCT: 32.6 % — ABNORMAL LOW (ref 36.0–46.0)
HEMOGLOBIN: 10.8 g/dL — AB (ref 12.0–15.0)
Immature Granulocytes: 0 %
LYMPHS PCT: 28 %
Lymphs Abs: 0.8 10*3/uL (ref 0.7–4.0)
MCH: 32.4 pg (ref 26.0–34.0)
MCHC: 33.1 g/dL (ref 30.0–36.0)
MCV: 97.9 fL (ref 80.0–100.0)
Monocytes Absolute: 0.4 10*3/uL (ref 0.1–1.0)
Monocytes Relative: 15 %
Neutro Abs: 1.6 10*3/uL — ABNORMAL LOW (ref 1.7–7.7)
Neutrophils Relative %: 56 %
PLATELETS: 179 10*3/uL (ref 150–400)
RBC: 3.33 MIL/uL — AB (ref 3.87–5.11)
RDW: 14.6 % (ref 11.5–15.5)
WBC: 2.9 10*3/uL — AB (ref 4.0–10.5)
nRBC: 0 % (ref 0.0–0.2)

## 2018-08-13 LAB — COMPREHENSIVE METABOLIC PANEL
ALT: 26 U/L (ref 0–44)
AST: 17 U/L (ref 15–41)
Albumin: 3.5 g/dL (ref 3.5–5.0)
Alkaline Phosphatase: 69 U/L (ref 38–126)
Anion gap: 8 (ref 5–15)
BILIRUBIN TOTAL: 0.4 mg/dL (ref 0.3–1.2)
BUN: 11 mg/dL (ref 8–23)
CHLORIDE: 106 mmol/L (ref 98–111)
CO2: 24 mmol/L (ref 22–32)
CREATININE: 0.74 mg/dL (ref 0.44–1.00)
Calcium: 9 mg/dL (ref 8.9–10.3)
GFR calc Af Amer: >60 mL/min (ref >60)
Glucose, Bld: 100 mg/dL — ABNORMAL HIGH (ref 70–99)
Potassium: 4.3 mmol/L (ref 3.5–5.1)
Sodium: 138 mmol/L (ref 135–145)
TOTAL PROTEIN: 6.3 g/dL — AB (ref 6.5–8.1)

## 2018-08-13 MED ORDER — DEXTROSE 5 % IV SOLN
Freq: Once | INTRAVENOUS | Status: AC
  Administered 2018-08-13: 11:00:00 via INTRAVENOUS
  Filled 2018-08-13: qty 250

## 2018-08-13 MED ORDER — SODIUM CHLORIDE 0.9 % IV SOLN
380.0000 mg | Freq: Once | INTRAVENOUS | Status: AC
  Administered 2018-08-13: 380 mg via INTRAVENOUS
  Filled 2018-08-13: qty 38

## 2018-08-13 MED ORDER — FAMOTIDINE IN NACL 20-0.9 MG/50ML-% IV SOLN
INTRAVENOUS | Status: AC
  Filled 2018-08-13: qty 50

## 2018-08-13 MED ORDER — DEXAMETHASONE SODIUM PHOSPHATE 10 MG/ML IJ SOLN
10.0000 mg | Freq: Once | INTRAMUSCULAR | Status: AC
  Administered 2018-08-13: 10 mg via INTRAVENOUS

## 2018-08-13 MED ORDER — SODIUM CHLORIDE 0.9% FLUSH
10.0000 mL | INTRAVENOUS | Status: DC | PRN
  Administered 2018-08-13: 10 mL
  Filled 2018-08-13: qty 10

## 2018-08-13 MED ORDER — DIPHENHYDRAMINE HCL 25 MG PO CAPS
25.0000 mg | ORAL_CAPSULE | Freq: Once | ORAL | Status: AC
  Administered 2018-08-13: 25 mg via ORAL

## 2018-08-13 MED ORDER — FAMOTIDINE IN NACL 20-0.9 MG/50ML-% IV SOLN
20.0000 mg | Freq: Once | INTRAVENOUS | Status: AC
  Administered 2018-08-13: 20 mg via INTRAVENOUS

## 2018-08-13 MED ORDER — DIPHENHYDRAMINE HCL 25 MG PO CAPS
ORAL_CAPSULE | ORAL | Status: AC
  Filled 2018-08-13: qty 1

## 2018-08-13 MED ORDER — SODIUM CHLORIDE 0.9% FLUSH
10.0000 mL | Freq: Once | INTRAVENOUS | Status: AC
  Administered 2018-08-13: 10 mL
  Filled 2018-08-13: qty 10

## 2018-08-13 MED ORDER — PALONOSETRON HCL INJECTION 0.25 MG/5ML
INTRAVENOUS | Status: AC
  Filled 2018-08-13: qty 5

## 2018-08-13 MED ORDER — PALONOSETRON HCL INJECTION 0.25 MG/5ML
0.2500 mg | Freq: Once | INTRAVENOUS | Status: AC
  Administered 2018-08-13: 0.25 mg via INTRAVENOUS

## 2018-08-13 MED ORDER — HEPARIN SOD (PORK) LOCK FLUSH 100 UNIT/ML IV SOLN
500.0000 [IU] | Freq: Once | INTRAVENOUS | Status: AC | PRN
  Administered 2018-08-13: 500 [IU]
  Filled 2018-08-13: qty 5

## 2018-08-13 MED ORDER — DEXAMETHASONE SODIUM PHOSPHATE 10 MG/ML IJ SOLN
INTRAMUSCULAR | Status: AC
  Filled 2018-08-13: qty 1

## 2018-08-13 MED ORDER — DOXORUBICIN HCL LIPOSOMAL CHEMO INJECTION 2 MG/ML
28.0000 mg/m2 | Freq: Once | INTRAVENOUS | Status: AC
  Administered 2018-08-13: 50 mg via INTRAVENOUS
  Filled 2018-08-13: qty 25

## 2018-08-13 NOTE — Patient Instructions (Signed)
[facility] Discharge Instructions for Patients Receiving Chemotherapy  Today you received the following chemotherapy agents Doxil and Carboplatin  To help prevent nausea and vomiting after your treatment, we encourage you to take your nausea medication as directed.   If you develop nausea and vomiting that is not controlled by your nausea medication, call the clinic.   BELOW ARE SYMPTOMS THAT SHOULD BE REPORTED IMMEDIATELY:  *FEVER GREATER THAN 100.5 F  *CHILLS WITH OR WITHOUT FEVER  NAUSEA AND VOMITING THAT IS NOT CONTROLLED WITH YOUR NAUSEA MEDICATION  *UNUSUAL SHORTNESS OF BREATH  *UNUSUAL BRUISING OR BLEEDING  TENDERNESS IN MOUTH AND THROAT WITH OR WITHOUT PRESENCE OF ULCERS  *URINARY PROBLEMS  *BOWEL PROBLEMS  UNUSUAL RASH Items with * indicate a potential emergency and should be followed up as soon as possible.  Feel free to call the clinic should you have any questions or concerns. The clinic phone number is [phone].  Please show the CHEMO ALERT CARD at check-in to the Emergency Department and triage nurse.   

## 2018-08-13 NOTE — Assessment & Plan Note (Signed)
I have reviewed multiple imaging study with the patient She has excellent response to therapy and recent normalization of tumor marker I recommend continue for 3 more cycles of treatment before repeat imaging study again For next cycle of treatment, we will delay treatment by 1 week due to the holidays and she is comfortable with the plan of care She will get echocardiogram repeated next month for further review

## 2018-08-13 NOTE — Assessment & Plan Note (Signed)

## 2018-08-13 NOTE — Assessment & Plan Note (Signed)
She has intermittent neutropenia She is doing very well overall With this cycle, we plan to delay treatment by 1 week and I think we can omit G-CSF support We discussed neutropenic precaution

## 2018-08-13 NOTE — Progress Notes (Signed)
[facility] OFFICE PROGRESS NOTE  Patient Care Team: [name], MD as PCP - General (Internal Medicine)  ASSESSMENT & PLAN:  Fallopian tube cancer, carcinoma ([place]) I have reviewed multiple imaging study with the patient She has excellent response to therapy and recent normalization of tumor marker I recommend continue for 3 more cycles of treatment before repeat imaging study again For next cycle of treatment, we will delay treatment by 1 week due to the holidays and she is comfortable with the plan of care She will get echocardiogram repeated next month for further review  Chemotherapy induced neutropenia ([place]) She has intermittent neutropenia She is doing very well overall With this cycle, we plan to delay treatment by 1 week and I think we can omit G-CSF support We discussed neutropenic precaution  Anemia due to antineoplastic chemotherapy This is likely due to recent treatment. The patient denies recent history of bleeding such as epistaxis, hematuria or hematochezia. She is asymptomatic from the anemia. I will observe for now.  She does not require transfusion now. I will continue the chemotherapy at current dose without dosage adjustment.  If the anemia gets progressive worse in the future, I might have to delay her treatment or adjust the chemotherapy dose.   Skin rash She has recent skin rash treated successfully by dermatologist I would defer to them for further management.   No orders of the defined types were placed in this encounter.   INTERVAL HISTORY: Please see below for problem oriented charting. 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 Extensive omental nodularity highly worrisome for peritoneal carcinomatosis. Late recurrence of melanoma can present as peritoneal carcinomatosis. Ovarian cancer more commonly presents in this manner. Patient does have a predominately low density right adnexal lesion measuring up to 3.5 cm, although this lesion is not typical for ovarian cancer. 2. No evidence of bowel or ureteral obstruction. 3. No other definite signs of metastatic disease. There are small indeterminate low-density hepatic lesions.    02/08/2016 Tumor Marker    Patient's tumor was tested for the following markers: CA-125 Results of the tumor marker test revealed 656.2    02/15/2016 Procedure    CT-guided core biopsy performed of omental mass just deep to the abdominal wall.    02/15/2016 Pathology Results    Omentum, biopsy, left - PAPILLARY SEROUS NEOPLASM, SEE COMMENT. Microscopic Comment There are papillary collections of largely low grade appearing cells with numerous psammoma bodies. Given the limited material it is difficult to distinguish between invasive implants of a serous borderline tumor and serous carcinoma, especially given the low grade appearance.    02/18/2016 Pathology Results    1. Omentum, resection for tumor INVASIVE IMPLANT OF HIGH GRADE SEROUS CARCINOMA 2. Uterus +/- tubes/ovaries, neoplastic HIGH GRADE SEROUS CARCINOMA INVOLVING LEFT TUBAL FIMBRIA SEROUS CARCINOMA WITH PREDOMINANT  PSAMMOMA BODIES IMPLANT AT UTERUS SEROSA, BILATERAL FALLOPIAN TUBAL SEROSA, AND ANTERIOR PERITONEAL REFLECTION CERVIX: HISTOLOGICAL UNREMARKABLE ENDOMETRIUM: INACTIVE  ENDOMETRIUM MYOMETRIUM: LEIOMYOMA LEFT OVARY: CYSTADENOFIBROMA RIGHT OVARY AND FALLOPIAN TUBE: HISTOLOGICAL UNREMARKABLE Microscopic Comment 2. ONCOLOGY TABLE - FALLOPIAN TUBE 1. Specimen, including laterality: Omentum, uterus, bilateral ovaries and fallopian tubes 2. Procedure: Hysterectomy, bilateral salpingo-oophorectomy and tumor debulking omentectomy 3. Lymph node sampling performed: No 4. Tumor site: uterus serosa, bilateral fallopian tubal serosa, peritoneal and omentum 5. Tumor location in fallopian tube: Left fallopian tubal fimbria 6. Specimen integrity (intact/ruptured/disrupted): Intact 7. Tumor size (cm): multi focal invasive omentum implant greater than 2 cm, left fallopian tube tumor 0.8 cm. 8. Histologic type: Serous carcinoma 9. Grade: 3 10. Microscopic tumor extension: uterus serosa, bilateral fallopian tubal serosa, peritoneal and omentum 11. Margins: NA 12. Lymph-Vascular invasion: identified 13. Lymph nodes: # examined: 0; # positive: NA 14. TNM: pT3c, pNx 15. FIGO Stage (based on pathologic findings, needs clinical correlation: IIIC 16. Comment: High grade serous carcinoma multifocally and extensively involves left fallopian tubal fimbria, omentum, uterus serosa, bilateral fallopian tubal serosa and peritoneal. At the left fallopian tube there are small foci of serous tubal intraepithelial carcinoma identified, so we conclude the carcinoma is fallopian tube origin    03/09/2016 Tumor Marker    Patient's tumor was tested for the following markers: CA-125 Results of the tumor marker test revealed 154.4    03/15/2016 Procedure    Technically successful right IJ power-injectable port catheter placement. Ready for routine use.    03/18/2016 - 07/13/2016 Chemotherapy    The patient had 6 cycles of carboplatin and taxol    04/14/2016 Tumor Marker    Patient's tumor was tested for the following markers: CA-125 Results of the tumor marker test revealed 36.7    04/25/2016  Genetic Testing    Patient has genetic testing done for germline mutation Results revealed patient has no mutation    04/28/2016 Tumor Marker    Patient's tumor was tested for the following markers: CA-125 Results of the tumor marker test revealed 24.6    06/21/2016 Tumor Marker    Patient's tumor was tested for the following markers: CA-125 Results of the tumor marker test revealed 16.6    08/01/2016 Tumor Marker    Patient's tumor was tested for the following markers: CA-125 Results of the tumor marker test revealed 17.2    08/05/2016 Imaging    Interval TAH-BSO. No evidence of residual pelvic mass or metastatic disease within the abdomen or pelvis. No other acute findings.     11/04/2016 Tumor Marker    Patient's tumor was tested for the following markers: CA-125 Results of the tumor marker test revealed 13.6    01/20/2017 Tumor Marker    Patient's tumor was tested for the following markers: CA-125 Results of the tumor marker test revealed 13.8    04/14/2017 Tumor Marker    Patient's tumor was tested for the following markers: CA-125 Results of the tumor marker test revealed 16.1    06/13/2017 Imaging    No acute findings.  No mass or hernia identified.  Colonic diverticulosis, without radiographic evidence of diverticulitis.  Aortic atherosclerosis.    07/03/2017 Tumor Marker    Patient's tumor was tested for the following markers: CA-125 Results of the tumor marker test revealed 18.4    09/19/2017 Tumor Marker    Patient's tumor was tested for the following markers: CA-125 Results of the tumor marker test revealed 18.5    01/23/2018 Tumor Marker  Patient's tumor was tested for the following markers: CA-125 Results of the tumor marker test revealed 35.4    01/30/2018 Imaging    1. No evidence of local fallopian tube carcinoma recurrence within the pelvis. Post hysterectomy. 2. No evidence of metastatic peritoneal disease or omental disease. No solid organ  metastasis.     03/20/2018 Tumor Marker    Patient's tumor was tested for the following markers: CA-125 Results of the tumor marker test revealed 73    05/02/2018 Tumor Marker    Patient's tumor was tested for the following markers: CA-125 Results of the tumor marker test revealed 127.1    05/08/2018 Imaging    CT abdomen and pelvis 1. 7 cm left subdiaphragmatic collection of loculated fluid versus low-density soft tissue. Given the patient's history of rising CA 125, metastatic disease considered highly likely.  2. Areas of fluid or recurrent disease identified in the right lower quadrant adjacent to the cecum and along right lower quadrant small bowel loops.    05/14/2018 Tumor Marker    Patient's tumor was tested for the following markers: CA-125 Results of the tumor marker test revealed 166     Genetic Testing    Patient has genetic testing done for ER/PR and MMR. Results revealed patient has the following on 02/18/2016 surgical pathology: ER 20%, PR 0% MMR: normal     Genetic Testing    Patient has genetic testing done for MSI. Results revealed patient has the following: MSI stable    05/18/2018 -  Chemotherapy    The patient had carboplatin and Doxil    07/16/2018 Tumor Marker    Patient's tumor was tested for the following markers: CA-125 Results of the tumor marker test revealed 28.7    08/10/2018 Imaging    CT imaging:  Decreased size of peritoneal soft tissue masses in left upper quadrant, consistent with decreased metastatic disease.  No new or progressive metastatic disease. No other acute findings.  Colonic diverticulosis, without radiographic evidence of diverticulitis.  Stable small paraumbilical ventral hernia containing transverse colon.     REVIEW OF SYSTEMS:   Constitutional: Denies fevers, chills or abnormal weight loss Eyes: Denies blurriness of vision Ears, nose, mouth, throat, and face: Denies mucositis or sore throat Respiratory: Denies cough,  dyspnea or wheezes Cardiovascular: Denies palpitation, chest discomfort or lower extremity swelling Gastrointestinal:  Denies nausea, heartburn or change in bowel habits Lymphatics: Denies new lymphadenopathy or easy bruising Neurological:Denies numbness, tingling or new weaknesses Behavioral/Psych: Mood is stable, no new changes  All other systems were reviewed with the patient and are negative.  I have reviewed the past medical history, past surgical history, social history and family history with the patient and they are unchanged from previous note.  ALLERGIES:  has No Known Allergies.  MEDICATIONS:  Current Outpatient Medications  Medication Sig Dispense Refill  . Cholecalciferol (VITAMIN D) 2000 units CAPS Take 1 capsule by mouth daily.    Marland Kitchen ezetimibe (ZETIA) 10 MG tablet Take 10 mg by mouth daily.    . ondansetron (ZOFRAN) 8 MG tablet Take 1 tablet (8 mg total) by mouth every 8 (eight) hours as needed for refractory nausea / vomiting. Start on day 3 after chemo. 30 tablet 1  . prochlorperazine (COMPAZINE) 10 MG tablet Take 1 tablet (10 mg total) by mouth every 6 (six) hours as needed (Nausea or vomiting). 30 tablet 1   No current facility-administered medications for this visit.    Facility-Administered Medications Ordered in Other Visits  Medication  Dose Route Frequency Provider Last Rate Last Dose  . CARBOplatin (PARAPLATIN) 380 mg in sodium chloride 0.9 % 250 mL chemo infusion  380 mg Intravenous Once Alvy Bimler, Danniell Rotundo, MD      . DOXOrubicin HCL LIPOSOMAL (DOXIL) 50 mg in dextrose 5 % 250 mL chemo infusion  28 mg/m2 (Treatment Plan Recorded) Intravenous Once [name], MD 550 mL/hr at 08/13/18 1159 50 mg at 08/13/18 1159  . heparin lock flush 100 unit/mL  500 Units Intracatheter Once PRN Alvy Bimler, Darlena Koval, MD      . sodium chloride flush (NS) 0.9 % injection 10 mL  10 mL Intracatheter PRN Alvy Bimler, Jadi Deyarmin, MD        PHYSICAL EXAMINATION: ECOG PERFORMANCE STATUS: 1 - Symptomatic but  completely ambulatory  Vitals:   08/13/18 0936  BP: 118/63  Pulse: 63  Resp: 18  Temp: 97.6 F (36.4 C)  SpO2: 100%   Filed Weights   08/13/18 0936  Weight: 151 lb (68.5 kg)    GENERAL:alert, no distress and comfortable SKIN: skin color, texture, turgor are normal, no rashes or significant lesions EYES: normal, Conjunctiva are pink and non-injected, sclera clear OROPHARYNX:no exudate, no erythema and lips, buccal mucosa, and tongue normal  NECK: supple, thyroid normal size, non-tender, without nodularity LYMPH:  no palpable lymphadenopathy in the cervical, axillary or inguinal LUNGS: clear to auscultation and percussion with normal breathing effort HEART: regular rate & rhythm and no murmurs and no lower extremity edema ABDOMEN:abdomen soft, non-tender and normal bowel sounds Musculoskeletal:no cyanosis of digits and no clubbing  NEURO: alert & oriented x 3 with fluent speech, no focal motor/sensory deficits  LABORATORY DATA:  I have reviewed the data as listed    Component Value Date/Time   NA 138 08/13/2018 0834   NA 139 06/07/2017 1430   K 4.3 08/13/2018 0834   K 3.8 06/07/2017 1430   CL 106 08/13/2018 0834   CO2 24 08/13/2018 0834   CO2 27 06/07/2017 1430   GLUCOSE 100 (H) 08/13/2018 0834   GLUCOSE 124 06/07/2017 1430   BUN 11 08/13/2018 0834   BUN 14.0 06/07/2017 1430   CREATININE 0.74 08/13/2018 0834   CREATININE 0.8 06/07/2017 1430   CALCIUM 9.0 08/13/2018 0834   CALCIUM 9.3 06/07/2017 1430   PROT 6.3 (L) 08/13/2018 0834   PROT 6.6 09/30/2016 1027   ALBUMIN 3.5 08/13/2018 0834   ALBUMIN 3.5 09/30/2016 1027   AST 17 08/13/2018 0834   AST 18 09/30/2016 1027   ALT 26 08/13/2018 0834   ALT 21 09/30/2016 1027   ALKPHOS 69 08/13/2018 0834   ALKPHOS 77 09/30/2016 1027   BILITOT 0.4 08/13/2018 0834   BILITOT 0.37 09/30/2016 1027   GFRNONAA >60 08/13/2018 0834   GFRAA >60 08/13/2018 0834    No results found for: SPEP, UPEP  Lab Results  Component Value  Date   WBC 2.9 (L) 08/13/2018   NEUTROABS 1.6 (L) 08/13/2018   HGB 10.8 (L) 08/13/2018   HCT 32.6 (L) 08/13/2018   MCV 97.9 08/13/2018   PLT 179 08/13/2018      Chemistry      Component Value Date/Time   NA 138 08/13/2018 0834   NA 139 06/07/2017 1430   K 4.3 08/13/2018 0834   K 3.8 06/07/2017 1430   CL 106 08/13/2018 0834   CO2 24 08/13/2018 0834   CO2 27 06/07/2017 1430   BUN 11 08/13/2018 0834   BUN 14.0 06/07/2017 1430   CREATININE 0.74 08/13/2018 0834   CREATININE  0.8 06/07/2017 1430      Component Value Date/Time   CALCIUM 9.0 08/13/2018 0834   CALCIUM 9.3 06/07/2017 1430   ALKPHOS 69 08/13/2018 0834   ALKPHOS 77 09/30/2016 1027   AST 17 08/13/2018 0834   AST 18 09/30/2016 1027   ALT 26 08/13/2018 0834   ALT 21 09/30/2016 1027   BILITOT 0.4 08/13/2018 0834   BILITOT 0.37 09/30/2016 1027       RADIOGRAPHIC STUDIES: I have reviewed multiple imaging study with the patient I have personally reviewed the radiological images as listed and agreed with the findings in the report. Ct Abdomen Pelvis W Contrast  Result Date: 08/10/2018 CLINICAL DATA:  Followup metastatic left fallopian tube carcinoma. Currently undergoing chemotherapy. EXAM: CT ABDOMEN AND PELVIS WITH CONTRAST TECHNIQUE: Multidetector CT imaging of the abdomen and pelvis was performed using the standard protocol following bolus administration of intravenous contrast. CONTRAST:  167m OMNIPAQUE IOHEXOL 300 MG/ML  SOLN COMPARISON:  05/08/2018 FINDINGS: Lower Chest: No acute findings. Hepatobiliary: No hepatic masses identified. Gallbladder is unremarkable. Pancreas:  No mass or inflammatory changes. Spleen: Within normal limits in size and appearance. Adrenals/Urinary Tract: No masses identified. No evidence of hydronephrosis. Stomach/Bowel: No evidence of obstruction, inflammatory process or abnormal fluid collections. Colonic diverticulosis again seen, without evidence of diverticulitis. Small paraumbilical  ventral hernia again seen containing a loop of transverse colon. No evidence of bowel obstruction or ischemia. Vascular/Lymphatic: No pathologically enlarged lymph nodes. No abdominal aortic aneurysm. Aortic atherosclerosis. Reproductive: Previous hysterectomy. No evidence of mass or free fluid. Other: Left upper quadrant peritoneal mass in the gastrosplenic ligament has decreased in size, currently measuring 4.2 by 3.4 cm compared to 6.6 x 6.0 cm previously. Previously seen small peritoneal soft tissue density along the posterior surfaces spleen has nearly completely resolved. No new or enlarging masses identified. No evidence of ascites. Musculoskeletal:  No suspicious bone lesions identified. IMPRESSION: Decreased size of peritoneal soft tissue masses in left upper quadrant, consistent with decreased metastatic disease. No new or progressive metastatic disease.  No other acute findings. Colonic diverticulosis, without radiographic evidence of diverticulitis. Stable small paraumbilical ventral hernia containing transverse colon. Electronically Signed   By: JEarle GellM.D.   On: 08/10/2018 14:44    All questions were answered. The patient knows to call the clinic with any problems, questions or concerns. No barriers to learning was detected.  I spent 25 minutes counseling the patient face to face. The total time spent in the appointment was 30 minutes and more than 50% was on counseling and review of test results  NHeath Lark MD 08/13/2018 12:23 PM

## 2018-08-13 NOTE — Telephone Encounter (Signed)
Gave avs calendar is not printing

## 2018-08-13 NOTE — Assessment & Plan Note (Signed)
She has recent skin rash treated successfully by dermatologist I would defer to them for further management. 

## 2018-08-14 ENCOUNTER — Telehealth: Payer: Self-pay

## 2018-08-14 ENCOUNTER — Ambulatory Visit: Admit: 2018-08-14 | Discharge: 2018-08-14 | Payer: MEDICARE | Attending: Specialist | Primary: Family Medicine

## 2018-08-14 ENCOUNTER — Ambulatory Visit: Attending: Specialist | Primary: Family Medicine

## 2018-08-14 DIAGNOSIS — C50919 Malignant neoplasm of unspecified site of unspecified female breast: Secondary | ICD-10-CM

## 2018-08-14 LAB — CA 125: CANCER ANTIGEN (CA) 125: 21.6 U/mL (ref 0.0–38.1)

## 2018-08-14 NOTE — Progress Notes (Signed)
[facility]  [place], [place]   [address]  W: [phone]   F: [phone]      f/u HEME/ONC CONSULT    Reason for visit: management of breast cancer     Consulting physician:  Dr. Gilford Rile  Referring physician:  Dr. Minette Brine    HPI:   Dawn Green is a 77 y.o.  female who I am seeing in f/u for management of breast cancer.  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  Right lumpectomy on 04/24/13 showed 1.5 cm IDC, gr 2, no LVI, HER 2 negative (ratio 1.2, sig/cell 3), 0/3 LN involved, DCIS present with extensive intraductal component.    Was on estrogen patch for 10 years, stopped at diagnosis.      oncotype Dx = 23, intermediate, RR = 15%, ER + at 9.7, PR + at 7.5; HER 2 negative at 8.7    05/08/13 re-ex negative    S/p XRT from 06/19/13-07/10/13    Started anastrozole 07/17/13-07/2018    Interval history: In today for follow up.  Complains of gr 1 loss of appetite, gr 1 fatigue, gr 1 hair loss, gr 2 hot flashes, gr 1 insomnia, gr 1 cognition, gr 1 concentration, gr 1 cough, gr 1 itching, gr 1 libido, gr 3 vaginal dryness.    Normal colonoscopy summer 2015    DX   Encounter Diagnoses   Name Primary?   ??? Malignant neoplasm of female breast, unspecified estrogen receptor status, unspecified laterality, unspecified site of breast ([place]) Yes   ??? Hot flashes    ??? Fatigue, unspecified type    ??? Osteopenia, unspecified location    ??? Depression, unspecified depression type    ??? Postmenopausal    ??? Drug induced insomnia (HCC)                 Past Medical History:   Diagnosis Date   ??? Breast CA ([place]) 2014    Right - Lumpectomy    ??? Diabetes ([place])    ??? Glaucoma    ??? Hx of mammogram 02/05/2016    Negative per pt. Sees Dr. Laurena Bering    ??? Hyperlipemia    ??? Hypertension    ??? 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 Other ill-defined conditions(799.89) 2016    Mild URI x 4 months (allergies)    ??? Radiation therapy complication 0272   ??? Routine Papanicolaou smear 1996    Negative per pt.    ??? 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 BREAST SURGERY PROCEDURE UNLISTED Right 2014    Right breast lumpectomy   ??? HX BREAST BIOPSY Left     x 2 negative biopsies    ??? HX BREAST LUMPECTOMY Right 2014   ??? HX CATARACT REMOVAL Bilateral     w/ IOL implants   ??? HX TAH AND BSO  1997    for rapidly growing fibroid (JW) - benign.  Dr. Oran Rein.   ??? HX TUBAL LIGATION  1972     Social History     Socioeconomic History   ???  Marital status: MARRIED     Spouse name: Not on file   ??? Number of children: Not on file   ??? Years of education: Not on file   ??? Highest education level: Not on file   Tobacco Use   ??? Smoking status: Former Smoker     Packs/day: 1.00     Years: 30.00     Pack years: 30.00     Last attempt to quit: 04/17/1996     Years since quitting: 22.3   ??? Smokeless tobacco: Never Used   ??? Tobacco comment: Never used vapor or e-cigs    Substance and Sexual Activity   ??? Alcohol use: Yes     Alcohol/week: 11.7 standard drinks     Types: 14 Standard drinks or equivalent per week     Comment: 1 gin and tonics per day   ??? Drug use: No   ??? Sexual activity: Yes     Partners: Male     Birth control/protection: Surgical     Family History   Problem Relation Age of Onset   ??? Cancer Mother 78        Ovarian - Passed away in 01-03-65   ??? Ovarian Cancer Mother 73   ??? Other Son 58        Pancreatitis    ??? Cancer Paternal Aunt 13        Pancreatic       Current Outpatient Medications   Medication Sig Dispense Refill   ??? venlafaxine-SR (EFFEXOR-XR) 75 mg capsule TAKE 1 CAPSULE DAILY 90 Cap 2   ??? valsartan-hydroCHLOROthiazide (DIOVAN-HCT) 80-12.5 mg per tablet TAKE 1 TABLET DAILY 90 Tab 3   ??? ergocalciferol (VITAMIN D2) 50,000 unit capsule TAKE 1 CAPSULE EVERY 7 DAYS 12 Cap 4    ??? anastrozole (ARIMIDEX) 1 mg tablet Take 1 mg by mouth daily. 90 Tab 1   ??? atorvastatin (LIPITOR) 40 mg tablet TAKE 1 TABLET DAILY BEFORE BREAKFAST 90 Tab 3   ??? alendronate (FOSAMAX) 35 mg tablet TAKE 1 TABLET EVERY 7 DAYS 12 Tab 3   ??? krill-om-3-dha-epa-phospho-ast (MEGARED OMEGA-3 KRILL OIL) 1,000-230-60 mg cap Take 1 Tab by mouth two (2) times a day.     ??? brimonidine-timolol (COMBIGAN) 0.2-0.5 % drop ophthalmic solution Administer 1 Drop to both eyes every twelve (12) hours.     ??? aspirin 81 mg chewable tablet Take 81 mg by mouth daily.     ??? mupirocin (BACTROBAN) 2 % ointment Apply  to affected area four (4) times daily. 22 g 0   ??? Cetirizine (ZYRTEC) 10 mg cap Take  by mouth as needed.         Allergies   Allergen Reactions   ??? Shampoo & Body Wash [Skin Cleanser,General] Itching     Paul mitchell       Review of Systems    A comprehensive review of systems was performed and all systems were negative except for HPI and for the symptom report form, reviewed and scanned in.        Objective:  Physical Exam:  Visit Vitals  BP 126/69   Pulse 81   Temp 98 ??F (36.7 ??C) (Temporal)   Resp 18   Ht 5' 3"  (1.6 m)   Wt 173 lb 3.2 oz (78.6 kg)   SpO2 94%   BMI 30.68 kg/m??       General:  Alert, cooperative, no distress, appears stated age.   Head:  Normocephalic, without obvious abnormality, atraumatic.  Eyes:  Conjunctivae/corneas clear. PERRL, EOMs intact.   Throat: Lips, mucosa, and tongue normal.    Neck: Supple, symmetrical, trachea midline, no adenopathy, thyroid: no enlargement/tenderness/nodules   Back:   Symmetric, no curvature. ROM normal. No CVA tenderness.   Lungs:   Clear to auscultation bilaterally.       Heart:  Regular rate and rhythm, S1, S2 normal, no murmur, click, rub or gallop.   Abdomen:   Soft, non-tender. Bowel sounds normal. No masses,  No organomegaly.   Extremities: Extremities normal, atraumatic, no cyanosis or edema.   Skin: Skin color, texture, turgor normal. No rashes or lesions.   Lymph  nodes: Cervical, supraclavicular, and axillary nodes normal.   Neurologic: CNII-XII intact.             Diagnostic Imaging   Results for orders placed in visit on 05/22/18   XR KNEE RT 3 V       No results found for this or any previous visit.    05/22/13 dexa  Lumbar spine: L1-4   Bone mineral density (gm/cm2): 1.325   % of peak bone mass: 111   % for age matched controls: 131   T score: 1.1   Z score: 2.6   Hip: Right femoral neck   Bone mineral density (gm/cm2): 0.927   % of peak bone mass: 89   % for age matched controls: 72   T score: -0.8   Z score: 0.8   IMPRESSION:   This patient is normal using the World Health Organization criteria   As compared to the prior study, there has been no significant change   10 year probability of major osteoporotic fracture: 8.7%   10 year probability of hip fracture: 0.9%    02/06/15 Bilateral Mammogram  Negative    06/08/17 dexa  Findings:  ????  Femoral Neck Left:  Bone mineral density (gm/cm2):  0.882  % of peak bone mass:  85  % for age matched controls:  111  T-score:  -1.1  Z-score:  0.6  ??  Femoral Neck Right:  Bone mineral density (gm/cm2):  0.875  % of peak bone mass:  84  % for age matched controls:  110  T-score:  -1.2  Z-score:  0.6  ????  Total Hip Left:  Bone mineral density (gm/cm2):  0.99  % of peak bone mass:  98  % for age matched controls:  122  T-score:  -0.1  Z-score:  1.4  ??  Total Hip Right:  Bone mineral density (gm/cm2):  0.971  % of peak bone mass:  96  % for age matched controls:  119  T-score:  -0.3  Z-score:  1.2  ??  Lumbar Spine:  L1-4   Bone mineral density (gm/cm2):  1.174  % of peak bone mass:  98  % for age matched controls:  115  T-score:  -0.2  Z-score:  1.3  ??  33% Radius Left:  Bone mineral density (gm/cm2):  0.682  % of peak bone mass:  96  % for age matched controls:  96  T-score:  -0.4  Z-score:  1.9  ??  IMPRESSION  Impression:  ????  This patient is osteopenic using the World Health Organization criteria  The patient has had a prior study.   While no formal comparison is feasible, the  following observations are noted:  No change is observed.    02/16/17 bilat mammogram  Negative    03/01/18 Bilateral mammo: BI-RADS 3,  probable benign findings.  Recommend 6 mo fu.     Lab Results  Lab Results   Component Value Date/Time    WBC 4.8 10/02/2015 09:55 AM    HGB 12.6 10/02/2015 09:55 AM    HCT 37.3 10/02/2015 09:55 AM    PLATELET 299 10/02/2015 09:55 AM    MCV 92 10/02/2015 09:55 AM       Lab Results   Component Value Date/Time    Sodium 143 03/06/2018 10:05 AM    Potassium 3.8 03/06/2018 10:05 AM    Chloride 101 03/06/2018 10:05 AM    CO2 26 03/06/2018 10:05 AM    Anion gap 6 04/17/2013 11:01 AM    Glucose 148 (H) 03/06/2018 10:05 AM    BUN 18 03/06/2018 10:05 AM    Creatinine 0.67 03/06/2018 10:05 AM    BUN/Creatinine ratio 27 03/06/2018 10:05 AM    GFR est AA 99 03/06/2018 10:05 AM    GFR est non-AA 86 03/06/2018 10:05 AM    Calcium 9.1 03/06/2018 10:05 AM    AST (SGOT) 16 03/06/2018 10:05 AM    Alk. phosphatase 68 03/06/2018 10:05 AM    Protein, total 6.4 03/06/2018 10:05 AM    Albumin 4.1 03/06/2018 10:05 AM    A-G Ratio 1.8 03/06/2018 10:05 AM    ALT (SGPT) 18 03/06/2018 10:05 AM       .    Assessment/Plan:  77 y.o. 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  PS 0    1. Breast cancer stage: IA    Hormonal therapy: administered    No evidence of recurrence.  She has completed 5 years of anastrozole, with her LN negative disease, tumor size and oncotype, I feel it is reasonable to stop at 5 years.     Mammogram 03/01/18, BI-RADS 3, follow up right mammo scheduled for 09/05/18.     2. Hot flashes:  stable, currently taking Effexor 75 mg daily in the PM.  Did not tolerate gabapentin due to oversleeping.  She would like to stop effexor since we are stopping anastrozole, discontinuation titration written for patient.     3. Insomnia: drug induced, improved with effexor.  She would like to stop effexor since we are  stopping anastrozole, discontinuation titration written for patient.    4. Osteopenia:  Started fosamax 05/2015; taking vit D 50,000 int units weekly, dexa is stable in 05/2017    5. Fatigue:  May be due to anastrozole, stable    6. Mild depression, major disorder:  Improved, on effexor.  She would like to stop effexor since we are stopping anastrozole, discontinuation titration written for patient.    Thank you for this consult.  All of the patient's questions were answered today.      Patient Instructions   If you are discontinuing your effexor, please take one every other day for 2 weeks and then stop.      Follow-up and Dispositions    ?? Return in about 1 year (around 08/15/2019) for 1 yr, Laurena Bering.         Sonda Rumble MD

## 2018-08-14 NOTE — Progress Notes (Signed)
Dawn Green is a 77 y.o. female here for follow-up of breast cancer.

## 2018-08-14 NOTE — Progress Notes (Signed)
[facility]  [place], [place]   [address]  W: [phone]   F: [phone]      f/u HEME/ONC CONSULT    Reason for visit: management of breast cancer     Consulting physician:  Dr. Gilford Rile  Referring physician:  Dr. Minette Brine    HPI:   Dawn Green is a 77 y.o.  female who I am seeing in f/u for management of breast cancer.  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  Right lumpectomy on 04/24/13 showed 1.5 cm IDC, gr 2, no LVI, HER 2 negative (ratio 1.2, sig/cell 3), 0/3 LN involved, DCIS present with extensive intraductal component.    Was on estrogen patch for 10 years, stopped at diagnosis.      oncotype Dx = 23, intermediate, RR = 15%, ER + at 9.7, PR + at 7.5; HER 2 negative at 8.7    05/08/13 re-ex negative    S/p XRT from 06/19/13-07/10/13    Started anastrozole 07/17/13-07/2018    Interval history: In today for follow up.  Complains of gr 1 loss of appetite, gr 1 fatigue, gr 1 hair loss, gr 2 hot flashes, gr 1 insomnia, gr 1 cognition, gr 1 concentration, gr 1 cough, gr 1 itching, gr 1 libido, gr 3 vaginal dryness.    Normal colonoscopy summer 2015    DX   Encounter Diagnoses   Name Primary?   ??? Malignant neoplasm of female breast, unspecified estrogen receptor status, unspecified laterality, unspecified site of breast ([place]) Yes   ??? Hot flashes    ??? Fatigue, unspecified type    ??? Osteopenia, unspecified location    ??? Depression, unspecified depression type    ??? Postmenopausal    ??? Drug induced insomnia (HCC)                 Past Medical History:   Diagnosis Date   ??? Breast CA ([place]) 2014    Right - Lumpectomy    ??? Diabetes ([place])    ??? Glaucoma    ??? Hx of mammogram 02/05/2016    Negative per pt. Sees Dr. Laurena Bering    ??? Hyperlipemia    ??? Hypertension    ??? 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 Other ill-defined conditions(799.89) 2016    Mild URI x 4 months (allergies)    ??? Radiation therapy complication 1308   ??? Routine Papanicolaou smear 1996    Negative per pt.    ??? 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 BREAST SURGERY PROCEDURE UNLISTED Right 2014    Right breast lumpectomy   ??? HX BREAST BIOPSY Left     x 2 negative biopsies    ??? HX BREAST LUMPECTOMY Right 2014   ??? HX CATARACT REMOVAL Bilateral     w/ IOL implants   ??? HX TAH AND BSO  1997    for rapidly growing fibroid (JW) - benign.  Dr. Oran Rein.   ??? HX TUBAL LIGATION  1972     Social History     Socioeconomic History   ???  Marital status: MARRIED     Spouse name: Not on file   ??? Number of children: Not on file   ??? Years of education: Not on file   ??? Highest education level: Not on file   Tobacco Use   ??? Smoking status: Former Smoker     Packs/day: 1.00     Years: 30.00     Pack years: 30.00     Last attempt to quit: 04/17/1996     Years since quitting: 22.3   ??? Smokeless tobacco: Never Used   ??? Tobacco comment: Never used vapor or e-cigs    Substance and Sexual Activity   ??? Alcohol use: Yes     Alcohol/week: 11.7 standard drinks     Types: 14 Standard drinks or equivalent per week     Comment: 1 gin and tonics per day   ??? Drug use: No   ??? Sexual activity: Yes     Partners: Male     Birth control/protection: Surgical     Family History   Problem Relation Age of Onset   ??? Cancer Mother 22        Ovarian - Passed away in 12/30/64   ??? Ovarian Cancer Mother 30   ??? Other Son 4        Pancreatitis    ??? Cancer Paternal Aunt 46        Pancreatic       Current Outpatient Medications   Medication Sig Dispense Refill   ??? venlafaxine-SR (EFFEXOR-XR) 75 mg capsule TAKE 1 CAPSULE DAILY 90 Cap 2   ??? valsartan-hydroCHLOROthiazide (DIOVAN-HCT) 80-12.5 mg per tablet TAKE 1 TABLET DAILY 90 Tab 3    ??? ergocalciferol (VITAMIN D2) 50,000 unit capsule TAKE 1 CAPSULE EVERY 7 DAYS 12 Cap 4   ??? anastrozole (ARIMIDEX) 1 mg tablet Take 1 mg by mouth daily. 90 Tab 1   ??? atorvastatin (LIPITOR) 40 mg tablet TAKE 1 TABLET DAILY BEFORE BREAKFAST 90 Tab 3   ??? alendronate (FOSAMAX) 35 mg tablet TAKE 1 TABLET EVERY 7 DAYS 12 Tab 3   ??? krill-om-3-dha-epa-phospho-ast (MEGARED OMEGA-3 KRILL OIL) 1,000-230-60 mg cap Take 1 Tab by mouth two (2) times a day.     ??? brimonidine-timolol (COMBIGAN) 0.2-0.5 % drop ophthalmic solution Administer 1 Drop to both eyes every twelve (12) hours.     ??? aspirin 81 mg chewable tablet Take 81 mg by mouth daily.     ??? mupirocin (BACTROBAN) 2 % ointment Apply  to affected area four (4) times daily. 22 g 0   ??? Cetirizine (ZYRTEC) 10 mg cap Take  by mouth as needed.         Allergies   Allergen Reactions   ??? Shampoo & Body Wash [Skin Cleanser,General] Itching     Paul mitchell       Review of Systems    A comprehensive review of systems was performed and all systems were negative except for HPI and for the symptom report form, reviewed and scanned in.        Objective:  Physical Exam:  Visit Vitals  BP 126/69   Pulse 81   Temp 98 ??F (36.7 ??C) (Temporal)   Resp 18   Ht '5\' 3"'  (1.6 m)   Wt 173 lb 3.2 oz (78.6 kg)   SpO2 94%   BMI 30.68 kg/m??       General:  Alert, cooperative, no distress, appears stated age.   Head:  Normocephalic, without obvious abnormality, atraumatic.  Eyes:  Conjunctivae/corneas clear. PERRL, EOMs intact.   Throat: Lips, mucosa, and tongue normal.    Neck: Supple, symmetrical, trachea midline, no adenopathy, thyroid: no enlargement/tenderness/nodules   Back:   Symmetric, no curvature. ROM normal. No CVA tenderness.   Lungs:   Clear to auscultation bilaterally.       Heart:  Regular rate and rhythm, S1, S2 normal, no murmur, click, rub or gallop.   Abdomen:   Soft, non-tender. Bowel sounds normal. No masses,  No organomegaly.    Extremities: Extremities normal, atraumatic, no cyanosis or edema.   Skin: Skin color, texture, turgor normal. No rashes or lesions.   Lymph nodes: Cervical, supraclavicular, and axillary nodes normal.   Neurologic: CNII-XII intact.             Diagnostic Imaging   Results for orders placed in visit on 05/22/18   XR KNEE RT 3 V       No results found for this or any previous visit.    05/22/13 dexa  Lumbar spine: L1-4   Bone mineral density (gm/cm2): 1.325   % of peak bone mass: 111   % for age matched controls: 131   T score: 1.1   Z score: 2.6   Hip: Right femoral neck   Bone mineral density (gm/cm2): 0.927   % of peak bone mass: 89   % for age matched controls: 27   T score: -0.8   Z score: 0.8   IMPRESSION:   This patient is normal using the World Health Organization criteria   As compared to the prior study, there has been no significant change   10 year probability of major osteoporotic fracture: 8.7%   10 year probability of hip fracture: 0.9%    02/06/15 Bilateral Mammogram  Negative    06/08/17 dexa  Findings:  ????  Femoral Neck Left:  Bone mineral density (gm/cm2):  0.882  % of peak bone mass:  85  % for age matched controls:  111  T-score:  -1.1  Z-score:  0.6  ??  Femoral Neck Right:  Bone mineral density (gm/cm2):  0.875  % of peak bone mass:  84  % for age matched controls:  110  T-score:  -1.2  Z-score:  0.6  ????  Total Hip Left:  Bone mineral density (gm/cm2):  0.99  % of peak bone mass:  98  % for age matched controls:  122  T-score:  -0.1  Z-score:  1.4  ??  Total Hip Right:  Bone mineral density (gm/cm2):  0.971  % of peak bone mass:  96  % for age matched controls:  119  T-score:  -0.3  Z-score:  1.2  ??  Lumbar Spine:  L1-4   Bone mineral density (gm/cm2):  1.174  % of peak bone mass:  98  % for age matched controls:  115  T-score:  -0.2  Z-score:  1.3  ??  33% Radius Left:  Bone mineral density (gm/cm2):  0.682  % of peak bone mass:  96  % for age matched controls:  96  T-score:  -0.4  Z-score:  1.9  ??   IMPRESSION  Impression:  ????  This patient is osteopenic using the World Health Organization criteria  The patient has had a prior study.  While no formal comparison is feasible, the  following observations are noted:  No change is observed.    02/16/17 bilat mammogram  Negative    03/01/18 Bilateral mammo: BI-RADS 3,  probable benign findings.  Recommend 6 mo fu.     Lab Results  Lab Results   Component Value Date/Time    WBC 4.8 10/02/2015 09:55 AM    HGB 12.6 10/02/2015 09:55 AM    HCT 37.3 10/02/2015 09:55 AM    PLATELET 299 10/02/2015 09:55 AM    MCV 92 10/02/2015 09:55 AM       Lab Results   Component Value Date/Time    Sodium 143 03/06/2018 10:05 AM    Potassium 3.8 03/06/2018 10:05 AM    Chloride 101 03/06/2018 10:05 AM    CO2 26 03/06/2018 10:05 AM    Anion gap 6 04/17/2013 11:01 AM    Glucose 148 (H) 03/06/2018 10:05 AM    BUN 18 03/06/2018 10:05 AM    Creatinine 0.67 03/06/2018 10:05 AM    BUN/Creatinine ratio 27 03/06/2018 10:05 AM    GFR est AA 99 03/06/2018 10:05 AM    GFR est non-AA 86 03/06/2018 10:05 AM    Calcium 9.1 03/06/2018 10:05 AM    AST (SGOT) 16 03/06/2018 10:05 AM    Alk. phosphatase 68 03/06/2018 10:05 AM    Protein, total 6.4 03/06/2018 10:05 AM    Albumin 4.1 03/06/2018 10:05 AM    A-G Ratio 1.8 03/06/2018 10:05 AM    ALT (SGPT) 18 03/06/2018 10:05 AM       .    Assessment/Plan:  77 y.o. 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  PS 0    1. Breast cancer stage: IA    Hormonal therapy: administered    No evidence of recurrence.  She has completed 5 years of anastrozole, with her LN negative disease, tumor size and oncotype, I feel it is reasonable to stop at 5 years.     Mammogram 03/01/18, BI-RADS 3, follow up right mammo scheduled for 09/05/18.     2. Hot flashes:  stable, currently taking Effexor 75 mg daily in the PM.  Did not tolerate gabapentin due to oversleeping.  She would like to stop  effexor since we are stopping anastrozole, discontinuation titration written for patient.     3. Insomnia: drug induced, improved with effexor.  She would like to stop effexor since we are stopping anastrozole, discontinuation titration written for patient.    4. Osteopenia:  Started fosamax 05/2015; taking vit D 50,000 int units weekly, dexa is stable in 05/2017    5. Fatigue:  May be due to anastrozole, stable    6. Mild depression, major disorder:  Improved, on effexor.  She would like to stop effexor since we are stopping anastrozole, discontinuation titration written for patient.    Thank you for this consult.  All of the patient's questions were answered today.      Patient Instructions   If you are discontinuing your effexor, please take one every other day for 2 weeks and then stop.      Follow-up and Dispositions    ?? Return in about 1 year (around 08/15/2019) for 1 yr, Laurena Bering.         Sonda Rumble MD

## 2018-08-14 NOTE — Patient Instructions (Signed)
If you are discontinuing your effexor, please take one every other day for 2 weeks and then stop.

## 2018-08-14 NOTE — Telephone Encounter (Signed)
-----   Message from [name], MD sent at 08/14/2018  6:44 AM EDT ----- Regarding: labs are good Let her know CA-125 is still good ----- Message ----- From: Interface, Lab In [facility] Sent: 08/13/2018   9:05 AM EDT To: [name], MD

## 2018-08-14 NOTE — Telephone Encounter (Signed)
Called and given below message. She verbalized understanding. 

## 2018-08-24 ENCOUNTER — Ambulatory Visit (HOSPITAL_COMMUNITY)
Admission: RE | Admit: 2018-08-24 | Discharge: 2018-08-24 | Disposition: A | Discharge status: Home / Self Care | Payer: Medicare Other | Source: Ambulatory Visit | Attending: Hematology and Oncology | Admitting: Hematology and Oncology

## 2018-08-24 DIAGNOSIS — I950 Idiopathic hypotension: Secondary | ICD-10-CM | POA: Diagnosis not present

## 2018-08-24 DIAGNOSIS — E785 Hyperlipidemia, unspecified: Secondary | ICD-10-CM | POA: Diagnosis not present

## 2018-08-24 DIAGNOSIS — Z5111 Encounter for antineoplastic chemotherapy: Secondary | ICD-10-CM

## 2018-08-24 DIAGNOSIS — C5700 Malignant neoplasm of unspecified fallopian tube: Secondary | ICD-10-CM | POA: Insufficient documentation

## 2018-08-24 DIAGNOSIS — Z9221 Personal history of antineoplastic chemotherapy: Secondary | ICD-10-CM | POA: Insufficient documentation

## 2018-08-24 NOTE — Progress Notes (Signed)
  Echocardiogram 2D Echocardiogram has been performed.  Jessica Johnston 08/24/2018, 10:54 AM

## 2018-08-28 ENCOUNTER — Encounter

## 2018-08-29 ENCOUNTER — Telehealth: Payer: Self-pay | Admitting: Oncology

## 2018-08-29 NOTE — Telephone Encounter (Signed)
Jessica Johnston called and left a message about the results of her echo.  Called her back and advised her that Dr. Jana Hakim is reviewing the results and they are unchanged from her last echo.

## 2018-08-31 NOTE — Progress Notes (Signed)
Dr. Jana Hakim review recent Echo results from 08/24/2018. Okay to continue with treatment on 09/18/2018.

## 2018-09-05 ENCOUNTER — Inpatient Hospital Stay: Admit: 2018-09-05 | Payer: MEDICARE | Attending: Specialist | Primary: Family Medicine

## 2018-09-05 DIAGNOSIS — R928 Other abnormal and inconclusive findings on diagnostic imaging of breast: Secondary | ICD-10-CM

## 2018-09-07 DIAGNOSIS — F4322 Adjustment disorder with anxiety: Secondary | ICD-10-CM | POA: Diagnosis not present

## 2018-09-18 ENCOUNTER — Inpatient Hospital Stay: Payer: Medicare Other

## 2018-09-18 ENCOUNTER — Inpatient Hospital Stay (HOSPITAL_BASED_OUTPATIENT_CLINIC_OR_DEPARTMENT_OTHER): Payer: Medicare Other | Admitting: Hematology and Oncology

## 2018-09-18 ENCOUNTER — Inpatient Hospital Stay: Payer: Medicare Other | Attending: Gynecologic Oncology

## 2018-09-18 DIAGNOSIS — Z79899 Other long term (current) drug therapy: Secondary | ICD-10-CM | POA: Insufficient documentation

## 2018-09-18 DIAGNOSIS — C5702 Malignant neoplasm of left fallopian tube: Secondary | ICD-10-CM | POA: Diagnosis not present

## 2018-09-18 DIAGNOSIS — R21 Rash and other nonspecific skin eruption: Secondary | ICD-10-CM

## 2018-09-18 DIAGNOSIS — Z5111 Encounter for antineoplastic chemotherapy: Secondary | ICD-10-CM | POA: Insufficient documentation

## 2018-09-18 DIAGNOSIS — Z95828 Presence of other vascular implants and grafts: Secondary | ICD-10-CM

## 2018-09-18 DIAGNOSIS — D6481 Anemia due to antineoplastic chemotherapy: Secondary | ICD-10-CM

## 2018-09-18 DIAGNOSIS — D701 Agranulocytosis secondary to cancer chemotherapy: Secondary | ICD-10-CM

## 2018-09-18 DIAGNOSIS — C5700 Malignant neoplasm of unspecified fallopian tube: Secondary | ICD-10-CM

## 2018-09-18 DIAGNOSIS — T451X5A Adverse effect of antineoplastic and immunosuppressive drugs, initial encounter: Secondary | ICD-10-CM

## 2018-09-18 LAB — CBC WITH DIFFERENTIAL/PLATELET
Abs Immature Granulocytes: 0.01 10*3/uL (ref 0.00–0.07)
Basophils Absolute: 0 10*3/uL (ref 0.0–0.1)
Basophils Relative: 0 %
EOS PCT: 1 %
Eosinophils Absolute: 0 10*3/uL (ref 0.0–0.5)
HCT: 33.7 % — ABNORMAL LOW (ref 36.0–46.0)
Hemoglobin: 11.4 g/dL — ABNORMAL LOW (ref 12.0–15.0)
Immature Granulocytes: 0 %
Lymphocytes Relative: 31 %
Lymphs Abs: 0.9 10*3/uL (ref 0.7–4.0)
MCH: 33.7 pg (ref 26.0–34.0)
MCHC: 33.8 g/dL (ref 30.0–36.0)
MCV: 99.7 fL (ref 80.0–100.0)
Monocytes Absolute: 0.4 10*3/uL (ref 0.1–1.0)
Monocytes Relative: 15 %
Neutro Abs: 1.5 10*3/uL — ABNORMAL LOW (ref 1.7–7.7)
Neutrophils Relative %: 53 %
Platelets: 257 10*3/uL (ref 150–400)
RBC: 3.38 MIL/uL — ABNORMAL LOW (ref 3.87–5.11)
RDW: 13.4 % (ref 11.5–15.5)
WBC: 2.9 10*3/uL — ABNORMAL LOW (ref 4.0–10.5)
nRBC: 0 % (ref 0.0–0.2)

## 2018-09-18 LAB — COMPREHENSIVE METABOLIC PANEL
ALT: 34 U/L (ref 0–44)
ANION GAP: 8 (ref 5–15)
AST: 23 U/L (ref 15–41)
Albumin: 3.7 g/dL (ref 3.5–5.0)
Alkaline Phosphatase: 76 U/L (ref 38–126)
BUN: 10 mg/dL (ref 8–23)
CO2: 25 mmol/L (ref 22–32)
Calcium: 9.3 mg/dL (ref 8.9–10.3)
Chloride: 105 mmol/L (ref 98–111)
Creatinine, Ser: 0.74 mg/dL (ref 0.44–1.00)
GFR calc Af Amer: >60 mL/min (ref >60)
Glucose, Bld: 99 mg/dL (ref 70–99)
Potassium: 4.3 mmol/L (ref 3.5–5.1)
Sodium: 138 mmol/L (ref 135–145)
Total Bilirubin: 0.3 mg/dL (ref 0.3–1.2)
Total Protein: 6.8 g/dL (ref 6.5–8.1)

## 2018-09-18 MED ORDER — DOXORUBICIN HCL LIPOSOMAL CHEMO INJECTION 2 MG/ML
28.0000 mg/m2 | Freq: Once | INTRAVENOUS | Status: AC
  Administered 2018-09-18: 50 mg via INTRAVENOUS
  Filled 2018-09-18: qty 25

## 2018-09-18 MED ORDER — FAMOTIDINE IN NACL 20-0.9 MG/50ML-% IV SOLN
INTRAVENOUS | Status: AC
  Filled 2018-09-18: qty 50

## 2018-09-18 MED ORDER — DEXTROSE 5 % IV SOLN
Freq: Once | INTRAVENOUS | Status: AC
  Administered 2018-09-18: 13:00:00 via INTRAVENOUS
  Filled 2018-09-18: qty 250

## 2018-09-18 MED ORDER — FAMOTIDINE IN NACL 20-0.9 MG/50ML-% IV SOLN
20.0000 mg | Freq: Once | INTRAVENOUS | Status: AC
  Administered 2018-09-18: 20 mg via INTRAVENOUS

## 2018-09-18 MED ORDER — DEXAMETHASONE SODIUM PHOSPHATE 10 MG/ML IJ SOLN
INTRAMUSCULAR | Status: AC
  Filled 2018-09-18: qty 1

## 2018-09-18 MED ORDER — SODIUM CHLORIDE 0.9% FLUSH
10.0000 mL | Freq: Once | INTRAVENOUS | Status: AC
  Administered 2018-09-18: 10 mL
  Filled 2018-09-18: qty 10

## 2018-09-18 MED ORDER — DIPHENHYDRAMINE HCL 25 MG PO CAPS
ORAL_CAPSULE | ORAL | Status: AC
  Filled 2018-09-18: qty 1

## 2018-09-18 MED ORDER — SODIUM CHLORIDE 0.9 % IV SOLN
380.0000 mg | Freq: Once | INTRAVENOUS | Status: AC
  Administered 2018-09-18: 380 mg via INTRAVENOUS
  Filled 2018-09-18: qty 38

## 2018-09-18 MED ORDER — PALONOSETRON HCL INJECTION 0.25 MG/5ML
0.2500 mg | Freq: Once | INTRAVENOUS | Status: AC
  Administered 2018-09-18: 0.25 mg via INTRAVENOUS

## 2018-09-18 MED ORDER — DEXAMETHASONE SODIUM PHOSPHATE 10 MG/ML IJ SOLN
10.0000 mg | Freq: Once | INTRAMUSCULAR | Status: AC
  Administered 2018-09-18: 10 mg via INTRAVENOUS

## 2018-09-18 MED ORDER — PALONOSETRON HCL INJECTION 0.25 MG/5ML
INTRAVENOUS | Status: AC
  Filled 2018-09-18: qty 5

## 2018-09-18 MED ORDER — HEPARIN SOD (PORK) LOCK FLUSH 100 UNIT/ML IV SOLN
500.0000 [IU] | Freq: Once | INTRAVENOUS | Status: AC | PRN
  Administered 2018-09-18: 500 [IU]
  Filled 2018-09-18: qty 5

## 2018-09-18 MED ORDER — SODIUM CHLORIDE 0.9% FLUSH
10.0000 mL | INTRAVENOUS | Status: DC | PRN
  Administered 2018-09-18: 10 mL
  Filled 2018-09-18: qty 10

## 2018-09-18 MED ORDER — DIPHENHYDRAMINE HCL 25 MG PO CAPS
25.0000 mg | ORAL_CAPSULE | Freq: Once | ORAL | Status: AC
  Administered 2018-09-18: 25 mg via ORAL

## 2018-09-18 MED ORDER — SODIUM CHLORIDE 0.9 % IV SOLN
Freq: Once | INTRAVENOUS | Status: AC
  Administered 2018-09-18: 16:00:00 via INTRAVENOUS
  Filled 2018-09-18: qty 250

## 2018-09-18 NOTE — Patient Instructions (Signed)
[facility] Discharge Instructions for Patients Receiving Chemotherapy  Today you received the following chemotherapy agents Doxil and Carboplatin  To help prevent nausea and vomiting after your treatment, we encourage you to take your nausea medication as directed.   If you develop nausea and vomiting that is not controlled by your nausea medication, call the clinic.   BELOW ARE SYMPTOMS THAT SHOULD BE REPORTED IMMEDIATELY:  *FEVER GREATER THAN 100.5 F  *CHILLS WITH OR WITHOUT FEVER  NAUSEA AND VOMITING THAT IS NOT CONTROLLED WITH YOUR NAUSEA MEDICATION  *UNUSUAL SHORTNESS OF BREATH  *UNUSUAL BRUISING OR BLEEDING  TENDERNESS IN MOUTH AND THROAT WITH OR WITHOUT PRESENCE OF ULCERS  *URINARY PROBLEMS  *BOWEL PROBLEMS  UNUSUAL RASH Items with * indicate a potential emergency and should be followed up as soon as possible.  Feel free to call the clinic should you have any questions or concerns. The clinic phone number is [phone].  Please show the CHEMO ALERT CARD at check-in to the Emergency Department and triage nurse.   

## 2018-09-19 ENCOUNTER — Telehealth: Payer: Self-pay | Admitting: Oncology

## 2018-09-19 ENCOUNTER — Telehealth: Payer: Self-pay | Admitting: Hematology and Oncology

## 2018-09-19 ENCOUNTER — Encounter: Payer: Self-pay | Admitting: Hematology and Oncology

## 2018-09-19 LAB — CA 125: Cancer Antigen (CA) 125: 19.8 U/mL (ref 0.0–38.1)

## 2018-09-19 NOTE — Progress Notes (Signed)
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  Chemotherapy induced neutropenia ([facility]) This is likely due to side effects of treatment She is not symptomatic We will proceed with treatment without dose adjustment We discussed neutropenic precaution She does not need G-CSF support right now  Anemia due to antineoplastic chemotherapy This is likely due to recent treatment. The patient denies recent history of bleeding such as epistaxis, hematuria or hematochezia. She is asymptomatic from the anemia. I will observe for now.    Orders Placed This Encounter  Procedures  . CT ABDOMEN PELVIS W CONTRAST    Standing Status:   Future    Standing Expiration Date:   09/20/2019    Order Specific Question:   If indicated for the ordered procedure, I authorize the administration of contrast media per Radiology protocol    Answer:   Yes    Order Specific Question:   Preferred imaging location?    Answer:   [facility]    Order Specific Question:   Radiology Contrast Protocol - do NOT remove file path    Answer:   [email or web address]    INTERVAL HISTORY: Please see below for problem oriented charting. She returns for chemotherapy and follow-up She denies recent chest pain, shortness of breath or leg swelling Denies nausea or vomiting No recent changes in bowel habits Her weight is stable She has intermittent mild skin rash, resolved with conservative management The patient  denies any recent signs or symptoms of bleeding such as spontaneous epistaxis, hematuria or hematochezia.   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 Extensive omental nodularity highly worrisome for peritoneal carcinomatosis. Late recurrence of melanoma can present as peritoneal carcinomatosis. Ovarian cancer more commonly presents in this manner. Patient does have a predominately low density right adnexal lesion measuring up to 3.5 cm, although this lesion is not typical for ovarian cancer. 2. No evidence of bowel or ureteral obstruction. 3. No other definite signs of metastatic disease. There are small indeterminate low-density hepatic lesions.    02/08/2016 Tumor Marker    Patient's tumor was tested for the following markers: CA-125 Results of the tumor marker test revealed 656.2    02/15/2016 Procedure    CT-guided core biopsy performed of omental mass just deep to the abdominal wall.    02/15/2016 Pathology Results    Omentum, biopsy, left - PAPILLARY SEROUS NEOPLASM, SEE COMMENT. Microscopic Comment There are papillary collections of largely low grade appearing cells with numerous psammoma bodies. Given the limited material it is difficult to distinguish between invasive implants of a serous borderline tumor and serous carcinoma, especially given the low grade appearance.    02/18/2016 Pathology Results  1. Omentum, resection for tumor INVASIVE IMPLANT OF HIGH GRADE SEROUS CARCINOMA 2. Uterus +/- tubes/ovaries, neoplastic HIGH GRADE SEROUS CARCINOMA INVOLVING LEFT TUBAL FIMBRIA SEROUS CARCINOMA WITH PREDOMINANT PSAMMOMA BODIES IMPLANT AT UTERUS  SEROSA, BILATERAL FALLOPIAN TUBAL SEROSA, AND ANTERIOR PERITONEAL REFLECTION CERVIX: HISTOLOGICAL UNREMARKABLE ENDOMETRIUM: INACTIVE ENDOMETRIUM MYOMETRIUM: LEIOMYOMA LEFT OVARY: CYSTADENOFIBROMA RIGHT OVARY AND FALLOPIAN TUBE: HISTOLOGICAL UNREMARKABLE Microscopic Comment 2. ONCOLOGY TABLE - FALLOPIAN TUBE 1. Specimen, including laterality: Omentum, uterus, bilateral ovaries and fallopian tubes 2. Procedure: Hysterectomy, bilateral salpingo-oophorectomy and tumor debulking omentectomy 3. Lymph node sampling performed: No 4. Tumor site: uterus serosa, bilateral fallopian tubal serosa, peritoneal and omentum 5. Tumor location in fallopian tube: Left fallopian tubal fimbria 6. Specimen integrity (intact/ruptured/disrupted): Intact 7. Tumor size (cm): multi focal invasive omentum implant greater than 2 cm, left fallopian tube tumor 0.8 cm. 8. Histologic type: Serous carcinoma 9. Grade: 3 10. Microscopic tumor extension: uterus serosa, bilateral fallopian tubal serosa, peritoneal and omentum 11. Margins: NA 12. Lymph-Vascular invasion: identified 13. Lymph nodes: # examined: 0; # positive: NA 14. TNM: pT3c, pNx 15. FIGO Stage (based on pathologic findings, needs clinical correlation: IIIC 16. Comment: High grade serous carcinoma multifocally and extensively involves left fallopian tubal fimbria, omentum, uterus serosa, bilateral fallopian tubal serosa and peritoneal. At the left fallopian tube there are small foci of serous tubal intraepithelial carcinoma identified, so we conclude the carcinoma is fallopian tube origin    03/09/2016 Tumor Marker    Patient's tumor was tested for the following markers: CA-125 Results of the tumor marker test revealed 154.4    03/15/2016 Procedure    Technically successful right IJ power-injectable port catheter placement. Ready for routine use.    03/18/2016 - 07/13/2016 Chemotherapy    The patient had 6 cycles of carboplatin and taxol    04/14/2016 Tumor  Marker    Patient's tumor was tested for the following markers: CA-125 Results of the tumor marker test revealed 36.7    04/25/2016 Genetic Testing    Patient has genetic testing done for germline mutation Results revealed patient has no mutation    04/28/2016 Tumor Marker    Patient's tumor was tested for the following markers: CA-125 Results of the tumor marker test revealed 24.6    06/21/2016 Tumor Marker    Patient's tumor was tested for the following markers: CA-125 Results of the tumor marker test revealed 16.6    08/01/2016 Tumor Marker    Patient's tumor was tested for the following markers: CA-125 Results of the tumor marker test revealed 17.2    08/05/2016 Imaging    Interval TAH-BSO. No evidence of residual pelvic mass or metastatic disease within the abdomen or pelvis. No other acute findings.     11/04/2016 Tumor Marker    Patient's tumor was tested for the following markers: CA-125 Results of the tumor marker test revealed 13.6    01/20/2017 Tumor Marker    Patient's tumor was tested for the following markers: CA-125 Results of the tumor marker test revealed 13.8    04/14/2017 Tumor Marker    Patient's tumor was tested for the following markers: CA-125 Results of the tumor marker test revealed 16.1    06/13/2017 Imaging    No acute findings.  No mass or hernia identified.  Colonic diverticulosis, without radiographic evidence of diverticulitis.  Aortic atherosclerosis.    07/03/2017 Tumor Marker    Patient's tumor was tested for the following markers: CA-125 Results of the tumor marker test revealed 18.4    09/19/2017 Tumor  Marker    Patient's tumor was tested for the following markers: CA-125 Results of the tumor marker test revealed 18.5    01/23/2018 Tumor Marker    Patient's tumor was tested for the following markers: CA-125 Results of the tumor marker test revealed 35.4    01/30/2018 Imaging    1. No evidence of local fallopian tube carcinoma recurrence  within the pelvis. Post hysterectomy. 2. No evidence of metastatic peritoneal disease or omental disease. No solid organ metastasis.     03/20/2018 Tumor Marker    Patient's tumor was tested for the following markers: CA-125 Results of the tumor marker test revealed 73    05/02/2018 Tumor Marker    Patient's tumor was tested for the following markers: CA-125 Results of the tumor marker test revealed 127.1    05/08/2018 Imaging    CT abdomen and pelvis 1. 7 cm left subdiaphragmatic collection of loculated fluid versus low-density soft tissue. Given the patient's history of rising CA 125, metastatic disease considered highly likely.  2. Areas of fluid or recurrent disease identified in the right lower quadrant adjacent to the cecum and along right lower quadrant small bowel loops.    05/14/2018 Tumor Marker    Patient's tumor was tested for the following markers: CA-125 Results of the tumor marker test revealed 166     Genetic Testing    Patient has genetic testing done for ER/PR and MMR. Results revealed patient has the following on 02/18/2016 surgical pathology: ER 20%, PR 0% MMR: normal     Genetic Testing    Patient has genetic testing done for MSI. Results revealed patient has the following: MSI stable    05/18/2018 -  Chemotherapy    The patient had carboplatin and Doxil    07/16/2018 Tumor Marker    Patient's tumor was tested for the following markers: CA-125 Results of the tumor marker test revealed 28.7    08/10/2018 Imaging    CT imaging:  Decreased size of peritoneal soft tissue masses in left upper quadrant, consistent with decreased metastatic disease.  No new or progressive metastatic disease. No other acute findings.  Colonic diverticulosis, without radiographic evidence of diverticulitis.  Stable small paraumbilical ventral hernia containing transverse colon.    08/13/2018 Tumor Marker    Patient's tumor was tested for the following markers: CA-125 Results  of the tumor marker test revealed 21.6    08/24/2018 Echocardiogram    LV EF: 60% -  65%     REVIEW OF SYSTEMS:   Constitutional: Denies fevers, chills or abnormal weight loss Eyes: Denies blurriness of vision Ears, nose, mouth, throat, and face: Denies mucositis or sore throat Respiratory: Denies cough, dyspnea or wheezes Cardiovascular: Denies palpitation, chest discomfort or lower extremity swelling Gastrointestinal:  Denies nausea, heartburn or change in bowel habits Skin: Denies abnormal skin rashes Lymphatics: Denies new lymphadenopathy or easy bruising Neurological:Denies numbness, tingling or new weaknesses Behavioral/Psych: Mood is stable, no new changes  All other systems were reviewed with the patient and are negative.  I have reviewed the past medical history, past surgical history, social history and family history with the patient and they are unchanged from previous note.  ALLERGIES:  has No Known Allergies.  MEDICATIONS:  Current Outpatient Medications  Medication Sig Dispense Refill  . Cholecalciferol (VITAMIN D) 2000 units CAPS Take 1 capsule by mouth daily.    Marland Kitchen ezetimibe (ZETIA) 10 MG tablet Take 10 mg by mouth daily.    . ondansetron (ZOFRAN) 8 MG tablet  Take 1 tablet (8 mg total) by mouth every 8 (eight) hours as needed for refractory nausea / vomiting. Start on day 3 after chemo. 30 tablet 1  . prochlorperazine (COMPAZINE) 10 MG tablet Take 1 tablet (10 mg total) by mouth every 6 (six) hours as needed (Nausea or vomiting). 30 tablet 1   No current facility-administered medications for this visit.     PHYSICAL EXAMINATION: ECOG PERFORMANCE STATUS: 1 - Symptomatic but completely ambulatory  Vitals:   09/18/18 1253  BP: 122/63  Pulse: 64  Resp: 18  Temp: (!) 97.5 F (36.4 C)  SpO2: 100%   Filed Weights   09/18/18 1253  Weight: 150 lb 3.2 oz (68.1 kg)    GENERAL:alert, no distress and comfortable SKIN: skin color, texture, turgor are normal, no  rashes or significant lesions EYES: normal, Conjunctiva are pink and non-injected, sclera clear OROPHARYNX:no exudate, no erythema and lips, buccal mucosa, and tongue normal  NECK: supple, thyroid normal size, non-tender, without nodularity LYMPH:  no palpable lymphadenopathy in the cervical, axillary or inguinal LUNGS: clear to auscultation and percussion with normal breathing effort HEART: regular rate & rhythm and no murmurs and no lower extremity edema ABDOMEN:abdomen soft, non-tender and normal bowel sounds Musculoskeletal:no cyanosis of digits and no clubbing  NEURO: alert & oriented x 3 with fluent speech, no focal motor/sensory deficits  LABORATORY DATA:  I have reviewed the data as listed    Component Value Date/Time   NA 138 09/18/2018 1127   NA 139 06/07/2017 1430   K 4.3 09/18/2018 1127   K 3.8 06/07/2017 1430   CL 105 09/18/2018 1127   CO2 25 09/18/2018 1127   CO2 27 06/07/2017 1430   GLUCOSE 99 09/18/2018 1127   GLUCOSE 124 06/07/2017 1430   BUN 10 09/18/2018 1127   BUN 14.0 06/07/2017 1430   CREATININE 0.74 09/18/2018 1127   CREATININE 0.8 06/07/2017 1430   CALCIUM 9.3 09/18/2018 1127   CALCIUM 9.3 06/07/2017 1430   PROT 6.8 09/18/2018 1127   PROT 6.6 09/30/2016 1027   ALBUMIN 3.7 09/18/2018 1127   ALBUMIN 3.5 09/30/2016 1027   AST 23 09/18/2018 1127   AST 18 09/30/2016 1027   ALT 34 09/18/2018 1127   ALT 21 09/30/2016 1027   ALKPHOS 76 09/18/2018 1127   ALKPHOS 77 09/30/2016 1027   BILITOT 0.3 09/18/2018 1127   BILITOT 0.37 09/30/2016 1027   GFRNONAA >60 09/18/2018 1127   GFRAA >60 09/18/2018 1127    No results found for: SPEP, UPEP  Lab Results  Component Value Date   WBC 2.9 (L) 09/18/2018   NEUTROABS 1.5 (L) 09/18/2018   HGB 11.4 (L) 09/18/2018   HCT 33.7 (L) 09/18/2018   MCV 99.7 09/18/2018   PLT 257 09/18/2018      Chemistry      Component Value Date/Time   NA 138 09/18/2018 1127   NA 139 06/07/2017 1430   K 4.3 09/18/2018 1127   K  3.8 06/07/2017 1430   CL 105 09/18/2018 1127   CO2 25 09/18/2018 1127   CO2 27 06/07/2017 1430   BUN 10 09/18/2018 1127   BUN 14.0 06/07/2017 1430   CREATININE 0.74 09/18/2018 1127   CREATININE 0.8 06/07/2017 1430      Component Value Date/Time   CALCIUM 9.3 09/18/2018 1127   CALCIUM 9.3 06/07/2017 1430   ALKPHOS 76 09/18/2018 1127   ALKPHOS 77 09/30/2016 1027   AST 23 09/18/2018 1127   AST 18 09/30/2016 1027  ALT 34 09/18/2018 1127   ALT 21 09/30/2016 1027   BILITOT 0.3 09/18/2018 1127   BILITOT 0.37 09/30/2016 1027       All questions were answered. The patient knows to call the clinic with any problems, questions or concerns. No barriers to learning was detected.  I spent 20 minutes counseling the patient face to face. The total time spent in the appointment was 25 minutes and more than 50% was on counseling and review of test results  [name], MD 09/19/2018 9:38 AM

## 2018-09-19 NOTE — Assessment & Plan Note (Signed)
This is likely due to side effects of treatment She is not symptomatic We will proceed with treatment without dose adjustment We discussed neutropenic precaution She does not need G-CSF support right now

## 2018-09-19 NOTE — Assessment & Plan Note (Signed)
This is likely due to recent treatment. The patient denies recent history of bleeding such as epistaxis, hematuria or hematochezia. She is asymptomatic from the anemia. I will observe for now.   

## 2018-09-19 NOTE — Telephone Encounter (Signed)
Jessica Johnston called and said she forgot to stop by scheduling yesterday and wanted to review her appointments.  Went over 10/15/18 appointments.  She also said that she is supposed to have a CT scan after her next cycle of chemo.  She said she is going to be out of town the last 2 weeks in January.  She also asked about her CA 125 results from yesterday.  Advised her of result per Joylene John, NP.

## 2018-09-19 NOTE — Telephone Encounter (Signed)
Made appts per 12/3 los.  Called patient with 12/30 appt time and mailed calendar.  Noted patient is my chart active, as well.

## 2018-09-19 NOTE — Assessment & Plan Note (Signed)
She has recent skin rash treated successfully by dermatologist I would defer to them for further management.

## 2018-09-19 NOTE — Assessment & Plan Note (Addendum)
She has excellent response to therapy and recent normalization of tumor marker I recommend continue for 3 more cycles of treatment before repeat imaging study again, due next month Recent echocardiogram show preserved ejection fraction

## 2018-09-20 ENCOUNTER — Encounter: Payer: Self-pay | Admitting: Oncology

## 2018-09-20 NOTE — Telephone Encounter (Signed)
Called Jessica Johnston back and advised her that Dr. Alvy Bimler will discuss her schedule at her next appointment.

## 2018-10-03 ENCOUNTER — Telehealth: Payer: Self-pay | Admitting: *Deleted

## 2018-10-03 NOTE — Telephone Encounter (Signed)
Called and left the patient a message to call the office back. Need to schedule the patient to see Dr. Denman George in January

## 2018-10-12 ENCOUNTER — Telehealth: Payer: Self-pay | Admitting: Oncology

## 2018-10-12 NOTE — Telephone Encounter (Signed)
[facility] and advised her of message from Dr. Alvy Bimler.  She verbalized understanding and agreement.

## 2018-10-12 NOTE — Telephone Encounter (Signed)
[facility] called and said she has a cold and is wondering if it is OK to have chemo on Monday.  She said it started on Wednesday, denies having a cough or fever and is taking Zicam PRN.

## 2018-10-12 NOTE — Telephone Encounter (Signed)
Hard to predict how she might feel Why don't you advise her to call Monday morning if still not feeling well? If just some congestion without fever, ok to proceed

## 2018-10-15 ENCOUNTER — Inpatient Hospital Stay: Payer: Medicare Other

## 2018-10-15 ENCOUNTER — Inpatient Hospital Stay (HOSPITAL_BASED_OUTPATIENT_CLINIC_OR_DEPARTMENT_OTHER): Payer: Medicare Other | Admitting: Hematology and Oncology

## 2018-10-15 ENCOUNTER — Telehealth: Payer: Self-pay | Admitting: Oncology

## 2018-10-15 ENCOUNTER — Encounter: Payer: Self-pay | Admitting: Hematology and Oncology

## 2018-10-15 DIAGNOSIS — D6481 Anemia due to antineoplastic chemotherapy: Secondary | ICD-10-CM | POA: Diagnosis not present

## 2018-10-15 DIAGNOSIS — Z79899 Other long term (current) drug therapy: Secondary | ICD-10-CM | POA: Diagnosis not present

## 2018-10-15 DIAGNOSIS — D61818 Other pancytopenia: Secondary | ICD-10-CM | POA: Diagnosis not present

## 2018-10-15 DIAGNOSIS — C5700 Malignant neoplasm of unspecified fallopian tube: Secondary | ICD-10-CM

## 2018-10-15 DIAGNOSIS — R7989 Other specified abnormal findings of blood chemistry: Secondary | ICD-10-CM

## 2018-10-15 DIAGNOSIS — C5702 Malignant neoplasm of left fallopian tube: Secondary | ICD-10-CM | POA: Diagnosis not present

## 2018-10-15 DIAGNOSIS — Z95828 Presence of other vascular implants and grafts: Secondary | ICD-10-CM

## 2018-10-15 DIAGNOSIS — D701 Agranulocytosis secondary to cancer chemotherapy: Secondary | ICD-10-CM | POA: Diagnosis not present

## 2018-10-15 DIAGNOSIS — R945 Abnormal results of liver function studies: Secondary | ICD-10-CM | POA: Diagnosis not present

## 2018-10-15 DIAGNOSIS — Z5111 Encounter for antineoplastic chemotherapy: Secondary | ICD-10-CM | POA: Diagnosis not present

## 2018-10-15 LAB — CBC WITH DIFFERENTIAL/PLATELET
Abs Immature Granulocytes: 0.01 10*3/uL (ref 0.00–0.07)
Basophils Absolute: 0 10*3/uL (ref 0.0–0.1)
Basophils Relative: 0 %
Eosinophils Absolute: 0 10*3/uL (ref 0.0–0.5)
Eosinophils Relative: 1 %
HCT: 30.1 % — ABNORMAL LOW (ref 36.0–46.0)
Hemoglobin: 10.1 g/dL — ABNORMAL LOW (ref 12.0–15.0)
Immature Granulocytes: 0 %
Lymphocytes Relative: 27 %
Lymphs Abs: 1.1 10*3/uL (ref 0.7–4.0)
MCH: 33.6 pg (ref 26.0–34.0)
MCHC: 33.6 g/dL (ref 30.0–36.0)
MCV: 100 fL (ref 80.0–100.0)
MONOS PCT: 14 %
Monocytes Absolute: 0.6 10*3/uL (ref 0.1–1.0)
Neutro Abs: 2.2 10*3/uL (ref 1.7–7.7)
Neutrophils Relative %: 58 %
Platelets: 180 10*3/uL (ref 150–400)
RBC: 3.01 MIL/uL — ABNORMAL LOW (ref 3.87–5.11)
RDW: 12.7 % (ref 11.5–15.5)
WBC: 3.9 10*3/uL — ABNORMAL LOW (ref 4.0–10.5)
nRBC: 0 % (ref 0.0–0.2)

## 2018-10-15 LAB — COMPREHENSIVE METABOLIC PANEL
ALT: 46 U/L — ABNORMAL HIGH (ref 0–44)
ANION GAP: 10 (ref 5–15)
AST: 23 U/L (ref 15–41)
Albumin: 3.2 g/dL — ABNORMAL LOW (ref 3.5–5.0)
Alkaline Phosphatase: 88 U/L (ref 38–126)
BUN: 11 mg/dL (ref 8–23)
CO2: 26 mmol/L (ref 22–32)
Calcium: 9.3 mg/dL (ref 8.9–10.3)
Chloride: 103 mmol/L (ref 98–111)
Creatinine, Ser: 0.75 mg/dL (ref 0.44–1.00)
Glucose, Bld: 115 mg/dL — ABNORMAL HIGH (ref 70–99)
Potassium: 4.3 mmol/L (ref 3.5–5.1)
Sodium: 139 mmol/L (ref 135–145)
Total Bilirubin: 0.3 mg/dL (ref 0.3–1.2)
Total Protein: 6.7 g/dL (ref 6.5–8.1)

## 2018-10-15 MED ORDER — DEXTROSE 5 % IV SOLN
Freq: Once | INTRAVENOUS | Status: AC
  Administered 2018-10-15: 14:00:00 via INTRAVENOUS
  Filled 2018-10-15: qty 250

## 2018-10-15 MED ORDER — FAMOTIDINE IN NACL 20-0.9 MG/50ML-% IV SOLN
20.0000 mg | Freq: Once | INTRAVENOUS | Status: AC
  Administered 2018-10-15: 20 mg via INTRAVENOUS

## 2018-10-15 MED ORDER — PALONOSETRON HCL INJECTION 0.25 MG/5ML
INTRAVENOUS | Status: AC
  Filled 2018-10-15: qty 5

## 2018-10-15 MED ORDER — DEXAMETHASONE SODIUM PHOSPHATE 10 MG/ML IJ SOLN
INTRAMUSCULAR | Status: AC
  Filled 2018-10-15: qty 1

## 2018-10-15 MED ORDER — SODIUM CHLORIDE 0.9% FLUSH
10.0000 mL | Freq: Once | INTRAVENOUS | Status: AC
  Administered 2018-10-15: 10 mL
  Filled 2018-10-15: qty 10

## 2018-10-15 MED ORDER — SODIUM CHLORIDE 0.9% FLUSH
10.0000 mL | INTRAVENOUS | Status: DC | PRN
  Administered 2018-10-15: 10 mL
  Filled 2018-10-15: qty 10

## 2018-10-15 MED ORDER — PALONOSETRON HCL INJECTION 0.25 MG/5ML
0.2500 mg | Freq: Once | INTRAVENOUS | Status: AC
  Administered 2018-10-15: 0.25 mg via INTRAVENOUS

## 2018-10-15 MED ORDER — DIPHENHYDRAMINE HCL 25 MG PO CAPS
ORAL_CAPSULE | ORAL | Status: AC
  Filled 2018-10-15: qty 1

## 2018-10-15 MED ORDER — DEXAMETHASONE SODIUM PHOSPHATE 10 MG/ML IJ SOLN
10.0000 mg | Freq: Once | INTRAMUSCULAR | Status: AC
  Administered 2018-10-15: 10 mg via INTRAVENOUS

## 2018-10-15 MED ORDER — SODIUM CHLORIDE 0.9 % IV SOLN
380.0000 mg | Freq: Once | INTRAVENOUS | Status: AC
  Administered 2018-10-15: 380 mg via INTRAVENOUS
  Filled 2018-10-15: qty 38

## 2018-10-15 MED ORDER — DIPHENHYDRAMINE HCL 25 MG PO CAPS
25.0000 mg | ORAL_CAPSULE | Freq: Once | ORAL | Status: AC
  Administered 2018-10-15: 25 mg via ORAL

## 2018-10-15 MED ORDER — FAMOTIDINE IN NACL 20-0.9 MG/50ML-% IV SOLN
INTRAVENOUS | Status: AC
  Filled 2018-10-15: qty 50

## 2018-10-15 MED ORDER — HEPARIN SOD (PORK) LOCK FLUSH 100 UNIT/ML IV SOLN
500.0000 [IU] | Freq: Once | INTRAVENOUS | Status: AC | PRN
  Administered 2018-10-15: 500 [IU]
  Filled 2018-10-15: qty 5

## 2018-10-15 MED ORDER — DOXORUBICIN HCL LIPOSOMAL CHEMO INJECTION 2 MG/ML
28.0000 mg/m2 | Freq: Once | INTRAVENOUS | Status: AC
  Administered 2018-10-15: 50 mg via INTRAVENOUS
  Filled 2018-10-15: qty 25

## 2018-10-15 NOTE — Progress Notes (Signed)
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  She has appointment to see me back early February to continue on treatment if indicated If so, I will order repeat echocardiogram before her treatment in February.  Pancytopenia, acquired [facility]) She is not symptomatic.  She does not need G-CSF support for now  Elevated LFTs She has occasional intermittent elevated liver enzyme.  She is not symptomatic.  Observe only for now   No orders of the defined types were placed in this encounter.   INTERVAL HISTORY: Please see below for problem oriented charting. She returns to be seen prior to cycle 6 of chemotherapy She feels well No recent infection, fever or chills The patient denies any recent signs or symptoms of bleeding such as spontaneous epistaxis, hematuria or hematochezia. She denies signs or symptoms of fatigue.  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 Extensive omental nodularity highly worrisome for peritoneal carcinomatosis. Late recurrence of melanoma can present as peritoneal  carcinomatosis. Ovarian cancer more commonly presents in this manner. Patient does have a predominately low density right adnexal lesion measuring up to 3.5 cm, although this lesion is not typical for ovarian cancer. 2. No evidence of bowel or ureteral obstruction. 3. No other definite signs of metastatic disease. There are small indeterminate low-density hepatic lesions.    02/08/2016 Tumor Marker    Patient's tumor was tested for the following markers: CA-125 Results of the tumor marker test revealed 656.2    02/15/2016 Procedure    CT-guided core biopsy performed of omental mass just deep to the abdominal wall.    02/15/2016 Pathology Results    Omentum, biopsy, left - PAPILLARY SEROUS NEOPLASM, SEE COMMENT. Microscopic Comment There are papillary collections of largely low grade appearing cells with numerous psammoma bodies. Given the limited material it is difficult to distinguish between invasive implants of a serous borderline tumor and serous carcinoma, especially given the low grade appearance.    02/18/2016 Pathology Results    1. Omentum, resection for tumor INVASIVE IMPLANT OF HIGH GRADE SEROUS CARCINOMA 2. Uterus +/- tubes/ovaries, neoplastic HIGH GRADE SEROUS CARCINOMA INVOLVING LEFT TUBAL FIMBRIA SEROUS CARCINOMA WITH PREDOMINANT PSAMMOMA BODIES IMPLANT AT UTERUS SEROSA, BILATERAL FALLOPIAN TUBAL SEROSA, AND ANTERIOR PERITONEAL REFLECTION CERVIX: HISTOLOGICAL UNREMARKABLE ENDOMETRIUM: INACTIVE ENDOMETRIUM MYOMETRIUM: LEIOMYOMA LEFT OVARY: CYSTADENOFIBROMA RIGHT OVARY AND FALLOPIAN TUBE: HISTOLOGICAL UNREMARKABLE Microscopic Comment 2. ONCOLOGY TABLE - FALLOPIAN TUBE 1. Specimen, including laterality: Omentum, uterus, bilateral ovaries and fallopian tubes 2. Procedure: Hysterectomy, bilateral salpingo-oophorectomy and tumor debulking omentectomy 3. Lymph node sampling performed: No 4. Tumor site: uterus serosa, bilateral fallopian tubal  serosa, peritoneal and omentum 5.  Tumor location in fallopian tube: Left fallopian tubal fimbria 6. Specimen integrity (intact/ruptured/disrupted): Intact 7. Tumor size (cm): multi focal invasive omentum implant greater than 2 cm, left fallopian tube tumor 0.8 cm. 8. Histologic type: Serous carcinoma 9. Grade: 3 10. Microscopic tumor extension: uterus serosa, bilateral fallopian tubal serosa, peritoneal and omentum 11. Margins: NA 12. Lymph-Vascular invasion: identified 13. Lymph nodes: # examined: 0; # positive: NA 14. TNM: pT3c, pNx 15. FIGO Stage (based on pathologic findings, needs clinical correlation: IIIC 16. Comment: High grade serous carcinoma multifocally and extensively involves left fallopian tubal fimbria, omentum, uterus serosa, bilateral fallopian tubal serosa and peritoneal. At the left fallopian tube there are small foci of serous tubal intraepithelial carcinoma identified, so we conclude the carcinoma is fallopian tube origin    03/09/2016 Tumor Marker    Patient's tumor was tested for the following markers: CA-125 Results of the tumor marker test revealed 154.4    03/15/2016 Procedure    Technically successful right IJ power-injectable port catheter placement. Ready for routine use.    03/18/2016 - 07/13/2016 Chemotherapy    The patient had 6 cycles of carboplatin and taxol    04/14/2016 Tumor Marker    Patient's tumor was tested for the following markers: CA-125 Results of the tumor marker test revealed 36.7    04/25/2016 Genetic Testing    Patient has genetic testing done for germline mutation Results revealed patient has no mutation    04/28/2016 Tumor Marker    Patient's tumor was tested for the following markers: CA-125 Results of the tumor marker test revealed 24.6    06/21/2016 Tumor Marker    Patient's tumor was tested for the following markers: CA-125 Results of the tumor marker test revealed 16.6    08/01/2016 Tumor Marker    Patient's tumor was tested for the following markers:  CA-125 Results of the tumor marker test revealed 17.2    08/05/2016 Imaging    Interval TAH-BSO. No evidence of residual pelvic mass or metastatic disease within the abdomen or pelvis. No other acute findings.     11/04/2016 Tumor Marker    Patient's tumor was tested for the following markers: CA-125 Results of the tumor marker test revealed 13.6    01/20/2017 Tumor Marker    Patient's tumor was tested for the following markers: CA-125 Results of the tumor marker test revealed 13.8    04/14/2017 Tumor Marker    Patient's tumor was tested for the following markers: CA-125 Results of the tumor marker test revealed 16.1    06/13/2017 Imaging    No acute findings.  No mass or hernia identified.  Colonic diverticulosis, without radiographic evidence of diverticulitis.  Aortic atherosclerosis.    07/03/2017 Tumor Marker    Patient's tumor was tested for the following markers: CA-125 Results of the tumor marker test revealed 18.4    09/19/2017 Tumor Marker    Patient's tumor was tested for the following markers: CA-125 Results of the tumor marker test revealed 18.5    01/23/2018 Tumor Marker    Patient's tumor was tested for the following markers: CA-125 Results of the tumor marker test revealed 35.4    01/30/2018 Imaging    1. No evidence of local fallopian tube carcinoma recurrence within the pelvis. Post hysterectomy. 2. No evidence of metastatic peritoneal disease or omental disease. No solid organ metastasis.     03/20/2018 Tumor Marker    Patient's tumor was tested for the following markers: CA-125 Results  of the tumor marker test revealed 73    05/02/2018 Tumor Marker    Patient's tumor was tested for the following markers: CA-125 Results of the tumor marker test revealed 127.1    05/08/2018 Imaging    CT abdomen and pelvis 1. 7 cm left subdiaphragmatic collection of loculated fluid versus low-density soft tissue. Given the patient's history of rising CA 125, metastatic  disease considered highly likely.  2. Areas of fluid or recurrent disease identified in the right lower quadrant adjacent to the cecum and along right lower quadrant small bowel loops.    05/14/2018 Tumor Marker    Patient's tumor was tested for the following markers: CA-125 Results of the tumor marker test revealed 166     Genetic Testing    Patient has genetic testing done for ER/PR and MMR. Results revealed patient has the following on 02/18/2016 surgical pathology: ER 20%, PR 0% MMR: normal     Genetic Testing    Patient has genetic testing done for MSI. Results revealed patient has the following: MSI stable    05/18/2018 -  Chemotherapy    The patient had carboplatin and Doxil    07/16/2018 Tumor Marker    Patient's tumor was tested for the following markers: CA-125 Results of the tumor marker test revealed 28.7    08/10/2018 Imaging    CT imaging:  Decreased size of peritoneal soft tissue masses in left upper quadrant, consistent with decreased metastatic disease.  No new or progressive metastatic disease. No other acute findings.  Colonic diverticulosis, without radiographic evidence of diverticulitis.  Stable small paraumbilical ventral hernia containing transverse colon.    08/13/2018 Tumor Marker    Patient's tumor was tested for the following markers: CA-125 Results of the tumor marker test revealed 21.6    08/24/2018 Echocardiogram    LV EF: 60% -  65%     REVIEW OF SYSTEMS:   Constitutional: Denies fevers, chills or abnormal weight loss Eyes: Denies blurriness of vision Ears, nose, mouth, throat, and face: Denies mucositis or sore throat Respiratory: Denies cough, dyspnea or wheezes Cardiovascular: Denies palpitation, chest discomfort or lower extremity swelling Gastrointestinal:  Denies nausea, heartburn or change in bowel habits Skin: Denies abnormal skin rashes Lymphatics: Denies new lymphadenopathy or easy bruising Neurological:Denies numbness,  tingling or new weaknesses Behavioral/Psych: Mood is stable, no new changes  All other systems were reviewed with the patient and are negative.  I have reviewed the past medical history, past surgical history, social history and family history with the patient and they are unchanged from previous note.  ALLERGIES:  has No Known Allergies.  MEDICATIONS:  Current Outpatient Medications  Medication Sig Dispense Refill  . Cholecalciferol (VITAMIN D) 2000 units CAPS Take 1 capsule by mouth daily.    Marland Kitchen ezetimibe (ZETIA) 10 MG tablet Take 10 mg by mouth daily.    . ondansetron (ZOFRAN) 8 MG tablet Take 1 tablet (8 mg total) by mouth every 8 (eight) hours as needed for refractory nausea / vomiting. Start on day 3 after chemo. 30 tablet 1  . prochlorperazine (COMPAZINE) 10 MG tablet Take 1 tablet (10 mg total) by mouth every 6 (six) hours as needed (Nausea or vomiting). 30 tablet 1   No current facility-administered medications for this visit.    Facility-Administered Medications Ordered in Other Visits  Medication Dose Route Frequency Provider Last Rate Last Dose  . CARBOplatin (PARAPLATIN) 380 mg in sodium chloride 0.9 % 250 mL chemo infusion  380 mg Intravenous Once  [name], MD      . DOXOrubicin HCL LIPOSOMAL (DOXIL) 50 mg in dextrose 5 % 250 mL chemo infusion  28 mg/m2 (Treatment Plan Recorded) Intravenous Once Alvy Bimler, Maurio Baize, MD      . famotidine (PEPCID) IVPB 20 mg premix  20 mg Intravenous Once Alvy Bimler, Liston Thum, MD 200 mL/hr at 10/15/18 1437 20 mg at 10/15/18 1437  . heparin lock flush 100 unit/mL  500 Units Intracatheter Once PRN Alvy Bimler, Keishawn Darsey, MD      . sodium chloride flush (NS) 0.9 % injection 10 mL  10 mL Intracatheter PRN Alvy Bimler, Leigh Kaeding, MD        PHYSICAL EXAMINATION: ECOG PERFORMANCE STATUS: 1 - Symptomatic but completely ambulatory  Vitals:   10/15/18 1357  BP: (!) 117/51  Pulse: 75  Resp: 18  Temp: 98.1 F (36.7 C)  SpO2: 99%   Filed Weights   10/15/18 1357  Weight: 152 lb  12.8 oz (69.3 kg)    GENERAL:alert, no distress and comfortable SKIN: skin color, texture, turgor are normal, no rashes or significant lesions EYES: normal, Conjunctiva are pink and non-injected, sclera clear OROPHARYNX:no exudate, no erythema and lips, buccal mucosa, and tongue normal  NECK: supple, thyroid normal size, non-tender, without nodularity LYMPH:  no palpable lymphadenopathy in the cervical, axillary or inguinal LUNGS: clear to auscultation and percussion with normal breathing effort HEART: regular rate & rhythm and no murmurs and no lower extremity edema ABDOMEN:abdomen soft, non-tender and normal bowel sounds Musculoskeletal:no cyanosis of digits and no clubbing  NEURO: alert & oriented x 3 with fluent speech, no focal motor/sensory deficits  LABORATORY DATA:  I have reviewed the data as listed    Component Value Date/Time   NA 139 10/15/2018 1328   NA 139 06/07/2017 1430   K 4.3 10/15/2018 1328   K 3.8 06/07/2017 1430   CL 103 10/15/2018 1328   CO2 26 10/15/2018 1328   CO2 27 06/07/2017 1430   GLUCOSE 115 (H) 10/15/2018 1328   GLUCOSE 124 06/07/2017 1430   BUN 11 10/15/2018 1328   BUN 14.0 06/07/2017 1430   CREATININE 0.75 10/15/2018 1328   CREATININE 0.8 06/07/2017 1430   CALCIUM 9.3 10/15/2018 1328   CALCIUM 9.3 06/07/2017 1430   PROT 6.7 10/15/2018 1328   PROT 6.6 09/30/2016 1027   ALBUMIN 3.2 (L) 10/15/2018 1328   ALBUMIN 3.5 09/30/2016 1027   AST 23 10/15/2018 1328   AST 18 09/30/2016 1027   ALT 46 (H) 10/15/2018 1328   ALT 21 09/30/2016 1027   ALKPHOS 88 10/15/2018 1328   ALKPHOS 77 09/30/2016 1027   BILITOT 0.3 10/15/2018 1328   BILITOT 0.37 09/30/2016 1027   GFRNONAA >60 10/15/2018 1328   GFRAA >60 10/15/2018 1328    No results found for: SPEP, UPEP  Lab Results  Component Value Date   WBC 3.9 (L) 10/15/2018   NEUTROABS 2.2 10/15/2018   HGB 10.1 (L) 10/15/2018   HCT 30.1 (L) 10/15/2018   MCV 100.0 10/15/2018   PLT 180 10/15/2018       Chemistry      Component Value Date/Time   NA 139 10/15/2018 1328   NA 139 06/07/2017 1430   K 4.3 10/15/2018 1328   K 3.8 06/07/2017 1430   CL 103 10/15/2018 1328   CO2 26 10/15/2018 1328   CO2 27 06/07/2017 1430   BUN 11 10/15/2018 1328   BUN 14.0 06/07/2017 1430   CREATININE 0.75 10/15/2018 1328   CREATININE 0.8 06/07/2017 1430  Component Value Date/Time   CALCIUM 9.3 10/15/2018 1328   CALCIUM 9.3 06/07/2017 1430   ALKPHOS 88 10/15/2018 1328   ALKPHOS 77 09/30/2016 1027   AST 23 10/15/2018 1328   AST 18 09/30/2016 1027   ALT 46 (H) 10/15/2018 1328   ALT 21 09/30/2016 1027   BILITOT 0.3 10/15/2018 1328   BILITOT 0.37 09/30/2016 1027     All questions were answered. The patient knows to call the clinic with any problems, questions or concerns. No barriers to learning was detected.  I spent 15 minutes counseling the patient face to face. The total time spent in the appointment was 20 minutes and more than 50% was on counseling and review of test results  [name], MD 10/15/2018 2:51 PM

## 2018-10-15 NOTE — Telephone Encounter (Signed)
Jessica Johnston called and said she still has a cold but is feeling better.  She denies having a fever.  She is going to come in today for her appointments.

## 2018-10-15 NOTE — Patient Instructions (Signed)
[facility] Discharge Instructions for Patients Receiving Chemotherapy  Today you received the following chemotherapy agents Doxil and Carboplatin  To help prevent nausea and vomiting after your treatment, we encourage you to take your nausea medication as directed.   If you develop nausea and vomiting that is not controlled by your nausea medication, call the clinic.   BELOW ARE SYMPTOMS THAT SHOULD BE REPORTED IMMEDIATELY:  *FEVER GREATER THAN 100.5 F  *CHILLS WITH OR WITHOUT FEVER  NAUSEA AND VOMITING THAT IS NOT CONTROLLED WITH YOUR NAUSEA MEDICATION  *UNUSUAL SHORTNESS OF BREATH  *UNUSUAL BRUISING OR BLEEDING  TENDERNESS IN MOUTH AND THROAT WITH OR WITHOUT PRESENCE OF ULCERS  *URINARY PROBLEMS  *BOWEL PROBLEMS  UNUSUAL RASH Items with * indicate a potential emergency and should be followed up as soon as possible.  Feel free to call the clinic should you have any questions or concerns. The clinic phone number is [phone].  Please show the CHEMO ALERT CARD at check-in to the Emergency Department and triage nurse.   

## 2018-10-15 NOTE — Assessment & Plan Note (Signed)
She has occasional intermittent elevated liver enzyme.  She is not symptomatic.  Observe only for now

## 2018-10-15 NOTE — Assessment & Plan Note (Signed)
She has excellent response to therapy and recent normalization of tumor marker I recommend continue for 3 more cycles of treatment before repeat imaging study again, due next month Recent echocardiogram showed preserved ejection fraction She will meet with her GYN oncologist to discuss the risks and benefits of continuing a few more cycles of treatment beyond cycle 6 or stop.  She has appointment to see me back early February to continue on treatment if indicated If so, I will order repeat echocardiogram before her treatment in February.

## 2018-10-15 NOTE — Assessment & Plan Note (Signed)
She is not symptomatic.  She does not need G-CSF support for now

## 2018-10-16 ENCOUNTER — Telehealth: Payer: Self-pay | Admitting: Hematology and Oncology

## 2018-10-16 LAB — CA 125: Cancer Antigen (CA) 125: 20.1 U/mL (ref 0.0–38.1)

## 2018-10-16 NOTE — Telephone Encounter (Signed)
Per 12/30 no los °

## 2018-10-18 ENCOUNTER — Telehealth: Payer: Self-pay | Admitting: Oncology

## 2018-10-18 NOTE — Telephone Encounter (Signed)
Based on my assessment from last week, I do not recommend antibiotics I recommend Mucinex OTC only OK to give her results of CA-125

## 2018-10-18 NOTE — Telephone Encounter (Signed)
Jessica Johnston called and said she is still congested and has a cough with "cloudy sputum." She is wondering if she should be on an antibiotic. She denies having a fever.  She is also asking for her last CA 125 result.

## 2018-10-18 NOTE — Telephone Encounter (Signed)
[facility] and advised her of message from Dr. Alvy Bimler.  She verbalized understanding and agreement.

## 2018-10-22 ENCOUNTER — Encounter (HOSPITAL_COMMUNITY): Payer: Self-pay

## 2018-10-22 ENCOUNTER — Ambulatory Visit (HOSPITAL_COMMUNITY)
Admission: RE | Admit: 2018-10-22 | Discharge: 2018-10-22 | Disposition: A | Discharge status: Home / Self Care | Payer: Medicare Other | Source: Ambulatory Visit | Attending: Hematology and Oncology | Admitting: Hematology and Oncology

## 2018-10-22 DIAGNOSIS — Z90722 Acquired absence of ovaries, bilateral: Secondary | ICD-10-CM | POA: Diagnosis not present

## 2018-10-22 DIAGNOSIS — K5730 Diverticulosis of large intestine without perforation or abscess without bleeding: Secondary | ICD-10-CM | POA: Diagnosis not present

## 2018-10-22 DIAGNOSIS — K669 Disorder of peritoneum, unspecified: Secondary | ICD-10-CM | POA: Diagnosis not present

## 2018-10-22 DIAGNOSIS — C5700 Malignant neoplasm of unspecified fallopian tube: Secondary | ICD-10-CM

## 2018-10-22 DIAGNOSIS — Z90710 Acquired absence of both cervix and uterus: Secondary | ICD-10-CM | POA: Diagnosis not present

## 2018-10-22 MED ORDER — SODIUM CHLORIDE (PF) 0.9 % IJ SOLN
INTRAMUSCULAR | Status: AC
  Filled 2018-10-22: qty 50

## 2018-10-22 MED ORDER — IOHEXOL 300 MG/ML  SOLN
100.0000 mL | Freq: Once | INTRAMUSCULAR | Status: AC | PRN
  Administered 2018-10-22: 100 mL via INTRAVENOUS

## 2018-10-26 ENCOUNTER — Telehealth: Payer: Self-pay | Admitting: Oncology

## 2018-10-26 ENCOUNTER — Ambulatory Visit: Admit: 2018-10-26 | Payer: MEDICARE | Attending: Family Medicine | Primary: Family Medicine

## 2018-10-26 ENCOUNTER — Ambulatory Visit: Attending: Family Medicine | Primary: Family Medicine

## 2018-10-26 DIAGNOSIS — E119 Type 2 diabetes mellitus without complications: Secondary | ICD-10-CM

## 2018-10-26 NOTE — Progress Notes (Signed)
 Chief Complaint   Patient presents with   . Follow-up   . Labs     Patient presents in office today for 6 month f/u and fasting labs.  Would like to get paperwork completed for a handicap placard.  No concerns.    1. Have you been to the ER, urgent care clinic since your last visit?  Hospitalized since your last visit?No    2. Have you seen or consulted any other health care providers outside of the [facility] since your last visit?  Include any pap smears or colon screening. No    Learning Assessment 05/06/2013   PRIMARY LEARNER Patient   HIGHEST LEVEL OF EDUCATION - PRIMARY LEARNER  SOME COLLEGE   BARRIERS PRIMARY LEARNER NONE   CO-LEARNER CAREGIVER No   PRIMARY LANGUAGE ENGLISH   INTERPRETER NEED No   LEARNER PREFERENCE PRIMARY READING     DEMONSTRATION     LISTENING   LEARNING SPECIAL TOPICS n/a   ANSWERED BY patient   RELATIONSHIP SELF

## 2018-10-26 NOTE — Progress Notes (Signed)
Dawn Green is a 78 y.o. female   Chief Complaint   Patient presents with   ??? Follow-up   ??? Labs    pt here for follow up with diabetes and this has been well controlled with diet.  Pt also with HLD on lipitor and tolerates this fine.  No worsening myalgias or arthralgias.  Hx of low D and is taking a supplement currently 50k units weekly.      Pt is requesting a handicap placard, has back pain and if she walks too far she will get severe aching in her back.  She has done PT for this.  Discussed will give for 6 months temp.  Then reevaluate.      Pt is now off arimidex.  No more hot flashes.  she is a 78 y.o. year old female who presents for evalution.      Reviewed PmHx, RxHx, FmHx, SocHx, AllgHx and updated and dated in the chart.    Review of Systems - negative except as listed above in the HPI    Objective:     Vitals:    10/26/18 1127   BP: 107/64   Pulse: 80   Resp: 18   Temp: 97.7 ??F (36.5 ??C)   TempSrc: Oral   SpO2: 96%   Weight: 175 lb (79.4 kg)   Height: 5\' 3"  (1.6 m)       Current Outpatient Medications   Medication Sig   ??? mupirocin (BACTROBAN) 2 % ointment Apply  to affected area four (4) times daily.   ??? venlafaxine-SR (EFFEXOR-XR) 75 mg capsule TAKE 1 CAPSULE DAILY   ??? valsartan-hydroCHLOROthiazide (DIOVAN-HCT) 80-12.5 mg per tablet TAKE 1 TABLET DAILY   ??? ergocalciferol (VITAMIN D2) 50,000 unit capsule TAKE 1 CAPSULE EVERY 7 DAYS   ??? atorvastatin (LIPITOR) 40 mg tablet TAKE 1 TABLET DAILY BEFORE BREAKFAST   ??? alendronate (FOSAMAX) 35 mg tablet TAKE 1 TABLET EVERY 7 DAYS   ??? Cetirizine (ZYRTEC) 10 mg cap Take  by mouth as needed.   ??? krill-om-3-dha-epa-phospho-ast (MEGARED OMEGA-3 KRILL OIL) 1,000-230-60 mg cap Take 1 Tab by mouth two (2) times a day.   ??? brimonidine-timolol (COMBIGAN) 0.2-0.5 % drop ophthalmic solution Administer 1 Drop to both eyes every twelve (12) hours.   ??? aspirin 81 mg chewable tablet Take 81 mg by mouth daily.   ??? anastrozole (ARIMIDEX) 1 mg tablet Take 1 mg by mouth daily.      No current facility-administered medications for this visit.        Physical Examination: General appearance - alert, well appearing, and in no distress  Chest - clear to auscultation, no wheezes, rales or rhonchi, symmetric air entry  Heart - normal rate, regular rhythm, normal S1, S2, no murmurs, rubs, clicks or gallops  Extremities - peripheral pulses normal, no pedal edema, no clubbing or cyanosis, feet normal, good pulses, normal color, temperature and sensation, monofilament sensory exam is normal in both feet      Assessment/ Plan:   Diagnoses and all orders for this visit:    1. Controlled type 2 diabetes mellitus without complication, without long-term current use of insulin (HCC)  -     HEMOGLOBIN A1C WITH EAG; Future  -     METABOLIC PANEL, COMPREHENSIVE; Future  -     HM DIABETES FOOT EXAM    2. Mixed hyperlipidemia  -     METABOLIC PANEL, COMPREHENSIVE; Future  -     LIPID PANEL; Future  3. Hypovitaminosis D  -     VITAMIN D, 25 HYDROXY; Future    4. Bilateral low back pain without sciatica, unspecified chronicity  6 month handicap placard form given     Follow-up and Dispositions    ?? Return in about 6 months (around 04/26/2019), or if symptoms worsen or fail to improve.           I have discussed the diagnosis with the patient and the intended plan as seen in the above orders.  The patient has received an after-visit summary and questions were answered concerning future plans. Pt conveyed understanding of plan.    Medication Side Effects and Warnings were discussed with patient      Esau Grew, DO

## 2018-10-26 NOTE — Progress Notes (Signed)
Chief Complaint   Patient presents with   ??? Follow-up   ??? Labs     Patient presents in office today for 6 month f/u and fasting labs.  Would like to get paperwork completed for a handicap placard.  No concerns.    1. Have you been to the ER, urgent care clinic since your last visit?  Hospitalized since your last visit?No    2. Have you seen or consulted any other health care providers outside of the [facility] since your last visit?  Include any pap smears or colon screening. No    Learning Assessment 05/06/2013   PRIMARY LEARNER Patient   HIGHEST LEVEL OF EDUCATION - PRIMARY LEARNER  SOME COLLEGE   BARRIERS PRIMARY LEARNER NONE   CO-LEARNER CAREGIVER No   PRIMARY LANGUAGE ENGLISH   INTERPRETER NEED No   LEARNER PREFERENCE PRIMARY READING     DEMONSTRATION     LISTENING   LEARNING SPECIAL TOPICS n/a   ANSWERED BY patient   RELATIONSHIP SELF

## 2018-10-26 NOTE — Progress Notes (Signed)
Dawn Green is a 78 y.o. female   Chief Complaint   Patient presents with   ??? Follow-up   ??? Labs    pt here for follow up with diabetes and this has been well controlled with diet.  Pt also with HLD on lipitor and tolerates this fine.  No worsening myalgias or arthralgias.  Hx of low D and is taking a supplement currently 50k units weekly.      Pt is requesting a handicap placard, has back pain and if she walks too far she will get severe aching in her back.  She has done PT for this.  Discussed will give for 6 months temp.  Then reevaluate.      Pt is now off arimidex.  No more hot flashes.  she is a 78 y.o. year old female who presents for evalution.      Reviewed PmHx, RxHx, FmHx, SocHx, AllgHx and updated and dated in the chart.    Review of Systems - negative except as listed above in the HPI    Objective:     Vitals:    10/26/18 1127   BP: 107/64   Pulse: 80   Resp: 18   Temp: 97.7 ??F (36.5 ??C)   TempSrc: Oral   SpO2: 96%   Weight: 175 lb (79.4 kg)   Height: 5\' 3"  (1.6 m)       Current Outpatient Medications   Medication Sig   ??? mupirocin (BACTROBAN) 2 % ointment Apply  to affected area four (4) times daily.   ??? venlafaxine-SR (EFFEXOR-XR) 75 mg capsule TAKE 1 CAPSULE DAILY   ??? valsartan-hydroCHLOROthiazide (DIOVAN-HCT) 80-12.5 mg per tablet TAKE 1 TABLET DAILY   ??? ergocalciferol (VITAMIN D2) 50,000 unit capsule TAKE 1 CAPSULE EVERY 7 DAYS   ??? atorvastatin (LIPITOR) 40 mg tablet TAKE 1 TABLET DAILY BEFORE BREAKFAST   ??? alendronate (FOSAMAX) 35 mg tablet TAKE 1 TABLET EVERY 7 DAYS   ??? Cetirizine (ZYRTEC) 10 mg cap Take  by mouth as needed.   ??? krill-om-3-dha-epa-phospho-ast (MEGARED OMEGA-3 KRILL OIL) 1,000-230-60 mg cap Take 1 Tab by mouth two (2) times a day.   ??? brimonidine-timolol (COMBIGAN) 0.2-0.5 % drop ophthalmic solution Administer 1 Drop to both eyes every twelve (12) hours.   ??? aspirin 81 mg chewable tablet Take 81 mg by mouth daily.   ??? anastrozole (ARIMIDEX) 1 mg tablet Take 1 mg by mouth daily.      No current facility-administered medications for this visit.        Physical Examination: General appearance - alert, well appearing, and in no distress  Chest - clear to auscultation, no wheezes, rales or rhonchi, symmetric air entry  Heart - normal rate, regular rhythm, normal S1, S2, no murmurs, rubs, clicks or gallops  Extremities - peripheral pulses normal, no pedal edema, no clubbing or cyanosis, feet normal, good pulses, normal color, temperature and sensation, monofilament sensory exam is normal in both feet      Assessment/ Plan:   Diagnoses and all orders for this visit:    1. Controlled type 2 diabetes mellitus without complication, without long-term current use of insulin (HCC)  -     HEMOGLOBIN A1C WITH EAG; Future  -     METABOLIC PANEL, COMPREHENSIVE; Future  -     HM DIABETES FOOT EXAM    2. Mixed hyperlipidemia  -     METABOLIC PANEL, COMPREHENSIVE; Future  -     LIPID PANEL; Future  3. Hypovitaminosis D  -     VITAMIN D, 25 HYDROXY; Future    4. Bilateral low back pain without sciatica, unspecified chronicity  6 month handicap placard form given     Follow-up and Dispositions    ?? Return in about 6 months (around 04/26/2019), or if symptoms worsen or fail to improve.           I have discussed the diagnosis with the patient and the intended plan as seen in the above orders.  The patient has received an after-visit summary and questions were answered concerning future plans. Pt conveyed understanding of plan.    Medication Side Effects and Warnings were discussed with patient      Dawn Grew, DO

## 2018-10-26 NOTE — Patient Instructions (Addendum)
A Healthy Lifestyle: Care Instructions  Your Care Instructions    A healthy lifestyle can help you feel good, stay at a healthy weight, and have plenty of energy for both work and play. A healthy lifestyle is something you can share with your whole family.  A healthy lifestyle also can lower your risk for serious health problems, such as high blood pressure, heart disease, and diabetes.  You can follow a few steps listed below to improve your health and the health of your family.  Follow-up care is a key part of your treatment and safety. Be sure to make and go to all appointments, and call your doctor if you are having problems. It's also a good idea to know your test results and keep a list of the medicines you take.  How can you care for yourself at home?  ?? Do not eat too much sugar, fat, or fast foods. You can still have dessert and treats now and then. The goal is moderation.  ?? Start small to improve your eating habits. Pay attention to portion sizes, drink less juice and soda pop, and eat more fruits and vegetables.  ? Eat a healthy amount of food. A 3-ounce serving of meat, for example, is about the size of a deck of cards. Fill the rest of your plate with vegetables and whole grains.  ? Limit the amount of soda and sports drinks you have every day. Drink more water when you are thirsty.  ? Eat at least 5 servings of fruits and vegetables every day. It may seem like a lot, but it is not hard to reach this goal. A serving or helping is 1 piece of fruit, 1 cup of vegetables, or 2 cups of leafy, raw vegetables. Have an apple or some carrot sticks as an afternoon snack instead of a candy bar. Try to have fruits and/or vegetables at every meal.  ?? Make exercise part of your daily routine. You may want to start with simple activities, such as walking, bicycling, or slow swimming. Try to be active 30 to 60 minutes every day. You do not need to do all 30 to 60  minutes all at once. For example, you can exercise 3 times a day for 10 or 20 minutes. Moderate exercise is safe for most people, but it is always a good idea to talk to your doctor before starting an exercise program.  ?? Keep moving. Mow the lawn, work in the garden, or clean your house. Take the stairs instead of the elevator at work.  ?? If you smoke, quit. People who smoke have an increased risk for heart attack, stroke, cancer, and other lung illnesses. Quitting is hard, but there are ways to boost your chance of quitting tobacco for good.  ? Use nicotine gum, patches, or lozenges.  ? Ask your doctor about stop-smoking programs and medicines.  ? Keep trying.  In addition to reducing your risk of diseases in the future, you will notice some benefits soon after you stop using tobacco. If you have shortness of breath or asthma symptoms, they will likely get better within a few weeks after you quit.  ?? Limit how much alcohol you drink. Moderate amounts of alcohol (up to 2 drinks a day for men, 1 drink a day for women) are okay. But drinking too much can lead to liver problems, high blood pressure, and other health problems.  Family health  If you have a family, there are many things you   can do together to improve your health.  ?? Eat meals together as a family as often as possible.  ?? Eat healthy foods. This includes fruits, vegetables, lean meats and dairy, and whole grains.  ?? Include your family in your fitness plan. Most people think of activities such as jogging or tennis as the way to fitness, but there are many ways you and your family can be more active. Anything that makes you breathe hard and gets your heart pumping is exercise. Here are some tips:  ? Walk to do errands or to take your child to school or the bus.  ? Go for a family bike ride after dinner instead of watching TV.  Where can you learn more?  Go to http://www.healthwise.net/[employer].   Enter U807 in the search box to learn more about "A Healthy Lifestyle: Care Instructions."  Current as of: Mar 13, 2018  Content Version: 12.2  ?? 2006-2019 [employer]. Care instructions adapted under license by [employer] (which disclaims liability or warranty for this information). If you have questions about a medical condition or this instruction, always ask your healthcare professional. Healthwise, Incorporated disclaims any warranty or liability for your use of this information.

## 2018-10-26 NOTE — Telephone Encounter (Signed)
Jessica Johnston called and said she saw her CT scan results from 10/22/18 in [place] and just wanted to make sure that they are OK.  Discussed that the results show that the peritoneal implants in the left upper abdomen are stable versus mildly decreased.  She verbalized agreement and understanding.

## 2018-10-29 DIAGNOSIS — F4322 Adjustment disorder with anxiety: Secondary | ICD-10-CM | POA: Diagnosis not present

## 2018-10-31 ENCOUNTER — Encounter: Payer: Self-pay | Admitting: Gynecologic Oncology

## 2018-10-31 ENCOUNTER — Inpatient Hospital Stay: Payer: Medicare Other | Attending: Gynecologic Oncology | Admitting: Gynecologic Oncology

## 2018-10-31 VITALS — BP 122/66 | HR 73 | Temp 98.3°F | Resp 18 | Ht 62.0 in | Wt 148.0 lb

## 2018-10-31 DIAGNOSIS — Z90722 Acquired absence of ovaries, bilateral: Secondary | ICD-10-CM | POA: Diagnosis not present

## 2018-10-31 DIAGNOSIS — Z9221 Personal history of antineoplastic chemotherapy: Secondary | ICD-10-CM | POA: Diagnosis not present

## 2018-10-31 DIAGNOSIS — Z90710 Acquired absence of both cervix and uterus: Secondary | ICD-10-CM | POA: Insufficient documentation

## 2018-10-31 DIAGNOSIS — C569 Malignant neoplasm of unspecified ovary: Secondary | ICD-10-CM

## 2018-10-31 DIAGNOSIS — C5702 Malignant neoplasm of left fallopian tube: Secondary | ICD-10-CM | POA: Diagnosis not present

## 2018-10-31 NOTE — Patient Instructions (Signed)
Plan to follow up with Dr. Alvy Bimler as scheduled.

## 2018-10-31 NOTE — Progress Notes (Signed)
Follow-up Note: Gyn-Onc  Consult was initially requested by Dr. Toney Johnston for the evaluation of Jessica Johnston 78 y.o. female  CC:  Chief Complaint  Patient presents with  . Malignant neoplasm of ovary, unspecified laterality [facility])    Assessment/Plan:  [name]  is a 78 y.o.  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 BRCA negative. Recurrence (peritoneal)in June, 2019.  Partial response to therapy.  I recommend continuing Doxil/carboplatin for 3 additional cycles. If she has had a complete or stable response at that time she could consider niraparib (PARPi) therapy as maintenance.   Asymptomatic umbilical incisional hernia. Declines surgical referral.  HPI: Jessica Johnston is a 78 year old para's woman who is seen in consultation at the request of Dr. Toney Johnston for abdominal peritoneal carcinomatosis and omental caking. The patient has a known history of a 3 cm left ovarian cyst for which she saw my partner approximately 15 months ago. At that time her CA-125 is mildly elevated to 49 in her Roma 1 score was also elevated. However the left ovarian cyst was unilocular, measured only 3 cm, and was long-standing, and therefore suspicion for occult malignancy was low.  In November 2016 she began feeling central and upper abdominal discomfort. Initially she thought it was part of the grieving process she lost her grandson. However when the pain and discomfort persisted she sought evaluation by her providers. A gynecologic evaluation was unremarkable. However eventually she underwent CT scan imaging on 01/27/2016 for this persistent midabdominal pain. CT imaging showed a liver containing a few small low-density lesions which were equivocal for the potential of metastatic disease these were subcentimeter  in dimension. There was an extensive omental nodularity which was highly worrisome for peritoneal carcinomatosis. A 3.5 cm stable left adnexal simple cyst was identified. This was not typical for ovarian cancer. There were no other obvious signs of metastatic disease including no gross ascites.  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 She is not required lymphadenectomy for any of these melanoma lesions. She is no family history significant for breast or ovarian cancer.   On 02/18/16 she underwent an exploratory laparotomy, TAH, BSO, omentectomy, radical tumor debulking and argon beam coagulation of milliary tumor implants. Disease was predominantly in the omentum with milliary studding of the ovaries and peritoneal surfaces. There was a right ovarian cyst measuring 3cm. There was no gross residual disease at completion of surgery with exception of the residual nodules that had received argon beam coagulation.  Postoperatively she did well with no complications.  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 The right ovarian cyst (that had been present in 2015) was benign.   She completed 6 cycles of adjuvant chemotherapy with carboplatin and paclitaxel on 07/15/16.   CA 125 was stable and normal at 17.2 on 08/08/16. CT abdo/pelvis on 08/05/16 showed no evidence for persistent disease.  CA 125 was stable and normal at 13.6 on 11/04/16, and 13.8 on 01/20/17.  Her sister was diagnosed with ovarian cancer in December, 2017. Jessica Johnston is BRCA negative. Her sister is as yet untested.   CA 125 on 04/13/17 was normal at 16.1.  CT abdo/pelvis on 06/20/17 showed a small supraumbilical hernia, but no other abnormalities and no evidence of metastatic or  recurrent disease. CA 125 was normal at 18.4 in September, 2018. Repeat CA 125 was normal and stable at 18.5 on 09/18/17.   Jessica Johnston reported some mild  abdominal bloating in April, 2019.  CA 125 on 01/23/18 had increased from 18 to 35.4 suggestive of recurrence.  CT abd/pelvis on January 30, 2018 revealed no evidence of recurrence within the pelvis, no evidence of metastatic peritoneal disease or omental disease.  No solid organ metastases.  [name] traveled to [place] for a vacation and was doing well with no symptoms other than vague abdominal discomfort that she was unclear was related to either cancer recurrence or her abdominal hernia.  Ca1 25 was redrawn on March 20, 2018.  It was progressively elevated at 73. She elected for expectant management as her son Doren Custard underwent open heart surgery for a history of valvular disease from endocarditis.  He experienced perioperative complications and was in a critical state in the ICU.  She and her family were devastated from this event. Her son pulled through but experienced a stroke and was in rehab for this.  Interval Hx:   CA 125 on 05/02/18 was 127. On 05/08/18 CT abd/pelvis showed : 7 x 6 x 6.6 cm collection of low-density soft tissue is identified in the left subdiaphragmatic space of the upper abdomen, lateral to the stomach and anterior to the spleen. This is new since 08/05/2016 and progressive since 01/30/2018. Similar 17 mm low-density soft tissue lesion posterior to the spleen is new since 01/30/2018. Small focus of abnormal soft tissue or fluid is identified along the lateral wall the cecum. Other areas of trace fluid are seen around right lower quadrant small bowel loops  She elected to proceed with salvage therapy for her recurrence commencing carboplatin and Doxil chemotherapy doublet on May 18, 2018.  Cycle 4 was administered on October 15, 2018.  Overall she tolerated this therapy fairly well with some mild symptoms of rash and oral canker sores.  Her Ca1 25 was 79 at the beginning of therapy and had fallen to a level of 20.1 on October 15, 2018.  CT abdomen pelvis was ordered on October 22, 2018 to monitor for response.  It demonstrated 2 peritoneal implants in the left upper abdomen which was stable versus mildly decreased from 4 cm to 3 cm.  No abdominopelvic ascites.  No new implants or new or progressive metastatic disease.  She denies symptoms of recurrence including vague bloating that she noticed earlier.   Current Meds:  Outpatient Encounter Medications as of 10/31/2018  Medication Sig  . Cholecalciferol (VITAMIN D) 2000 units CAPS Take 1 capsule by mouth daily.  Marland Kitchen ezetimibe (ZETIA) 10 MG tablet Take 10 mg by mouth daily.  . [DISCONTINUED] ondansetron (ZOFRAN) 8 MG tablet Take 1 tablet (8 mg total) by mouth every 8 (eight) hours as needed for refractory nausea / vomiting. Start on day 3 after chemo. (Patient not taking: Reported on 10/31/2018)  . [DISCONTINUED] prochlorperazine (COMPAZINE) 10 MG tablet Take 1 tablet (10 mg total) by mouth every 6 (six) hours as needed (Nausea or vomiting). (Patient not taking: Reported on 10/31/2018)   No facility-administered encounter medications on file as of 10/31/2018.     Allergy: No Known Allergies  Social Hx:   Social History   Socioeconomic History  . Marital status: Married    Spouse name: Hinton Dyer  . Number of children: 2  . Years of education: Not on file  . Highest education level: Not on file  Occupational  History  . Occupation: retired housewife  Social Needs  . Financial resource strain: Not on file  . Food insecurity:    Worry: Not on file    Inability: Not on file  . Transportation needs:    Medical: Not on file    Non-medical: Not on file  Tobacco Use  . Smoking status: Never Smoker  . Smokeless tobacco: Never Used  Substance and Sexual Activity  . Alcohol use: Yes    Alcohol/week: 9.0 standard drinks    Types: 9 Glasses of wine per week    Comment: 9 GLASSES OF WINE A WEEK   . Drug use: No  . Sexual activity: Not Currently    Comment: 1ST INTERCOURSE- 21, PARTNERS- 1, MARRIED- 73 YRS   Lifestyle  .  Physical activity:    Days per week: Not on file    Minutes per session: Not on file  . Stress: Not on file  Relationships  . Social connections:    Talks on phone: Not on file    Gets together: Not on file    Attends religious service: Not on file    Active member of club or organization: Not on file    Attends meetings of clubs or organizations: Not on file    Relationship status: Not on file  . Intimate partner violence:    Fear of current or ex partner: Not on file    Emotionally abused: Not on file    Physically abused: Not on file    Forced sexual activity: Not on file  Other Topics Concern  . Not on file  Social History Narrative  . Not on file    Past Surgical Hx:  Past Surgical History:  Procedure Laterality Date  . ABDOMINAL HYSTERECTOMY N/A 02/18/2016   Procedure: HYSTERECTOMY ABDOMINAL;  Surgeon: Everitt Amber, MD;  Location: [facility];  Service: Gynecology;  Laterality: N/A;  . DEBULKING N/A 02/18/2016   Procedure: RADICAL TUMOR DEBULKING;  Surgeon: Everitt Amber, MD;  Location: [facility];  Service: Gynecology;  Laterality: N/A;  . HYSTEROSCOPY    . LAPAROTOMY N/A 02/18/2016   Procedure: EXPLORATORY LAPAROTOMY;  Surgeon: Everitt Amber, MD;  Location: [facility];  Service: Gynecology;  Laterality: N/A;  . omental mass     Biopsy, CT guided  . OMENTECTOMY N/A 02/18/2016   Procedure: OMENTECTOMY;  Surgeon: Everitt Amber, MD;  Location: [facility];  Service: Gynecology;  Laterality: N/A;  . SALPINGOOPHORECTOMY Bilateral 02/18/2016   Procedure: BILATERAL SALPINGO OOPHORECTOMY;  Surgeon: Everitt Amber, MD;  Location: [facility];  Service: Gynecology;  Laterality: Bilateral;  . TONSILLECTOMY AND ADENOIDECTOMY  1948    Past Medical Hx:  Past Medical History:  Diagnosis Date  . Arthritis    arthritis -hands-knees.  . Cancer ([place])    MELANOMA. multiple x5 different sites. last 10 yrs ago.  Marland Kitchen Headache    past hx.-no in awhile.  . High cholesterol   . Melanoma ([place])   . Osteopenia   . Skin cancer     melanoma    Past Gynecological History:  SVD x 2  No LMP recorded. Patient has had a hysterectomy.  Family Hx:  Family History  Problem Relation Age of Onset  . Colitis Sister   . Cancer Sister 31       ovarian ca  . Parkinsonism Maternal Grandmother   . Heart disease Paternal Grandmother   . Heart disease Paternal Grandfather   . Cancer Cousin 12       paternal  first cousin  . Skin cancer Son     Review of Systems:  Constitutional  Feels well,    ENT Normal appearing ears and nares bilaterally Skin/Breast  No rash, sores, jaundice, itching, dryness Cardiovascular  No chest pain, shortness of breath, or edema  Pulmonary  No cough or wheeze.  Gastro Intestinal  No abdominal pain or difficulty eating. Vague abdominal discomfort. Genito Urinary  No frequency, urgency, dysuria, no bleeding Musculo Skeletal  No myalgia, arthralgia, joint swelling or pain  Neurologic  No weakness, numbness, change in gait,  Psychology  No depression, anxiety, insomnia.   Vitals:  Blood pressure 122/66, pulse 73, temperature 98.3 F (36.8 C), temperature source Oral, resp. rate 18, height _0  (1.575 m), weight 148 lb (67.1 kg), SpO2 100 %.  Physical Exam: WD in NAD Neck  Supple NROM, without any enlargements.  Lymph Node Survey No cervical supraclavicular or inguinal adenopathy Cardiovascular  Pulse normal rate, regularity and rhythm. S1 and S2 normal.  Lungs  Clear to auscultation bilateraly, without wheezes/crackles/rhonchi. Good air movement.  Skin  No rash/lesions/breakdown  Psychiatry  Alert and oriented to person, place, and time  Abdomen  Normoactive bowel sounds, abdomen soft, non-tender and mildly overweight (BMI 26) with soft reducible ventral hernia at level of umbilicus. Well healed incision. No palpable masses. No distension in the abdomen.  Back No CVA tenderness Genito Urinary  deferred Rectal  No posterior pelvic masses  Extremities  No bilateral  cyanosis, clubbing or edema.  Thereasa Solo, MD  10/31/2018, 1:14 PM

## 2018-11-02 NOTE — Telephone Encounter (Signed)
Called and informed patient that she can come in next week any morning fasting. Lab opens at 7:30 and closes for lunch at noon. Patient verbalized understanding.

## 2018-11-02 NOTE — Telephone Encounter (Signed)
Pt called in and stated that she was told to come back this week to get her labs done. Pt stated that she was unable to get them done last Fri because she had already eaten. Pt would like to know if she can come in today to get them done.  Phone [phone]

## 2018-11-02 NOTE — Telephone Encounter (Signed)
Rachel leaves at noon so no not today

## 2018-11-07 ENCOUNTER — Inpatient Hospital Stay: Admit: 2018-11-07 | Payer: MEDICARE | Primary: Family Medicine

## 2018-11-07 NOTE — Progress Notes (Signed)
Vit D level is normal.    Your diabetes worsened slightly and your triglycerides went up as well.  Please decrease sugars and carbs.  LDL or bad cholesterol could not be calculated due to the triglycerides being elevated.  Recheck labs 3-6 months.

## 2018-11-08 LAB — LIPID PANEL
CHOL/HDL Ratio: 3.9 (ref 0.0–5.0)
Chol/HDL Ratio: 3.9 (ref 0.0–5.0)
Cholesterol, Total: 172 MG/DL (ref <200)
Cholesterol, total: 172 MG/DL (ref <200)
HDL Cholesterol: 44 MG/DL
HDL: 44 MG/DL
LDL Calculated: ELEVATED MG/DL (ref 0–100)
LDL, calculated: ELEVATED MG/DL (ref 0–100)
Triglyceride: 462 MG/DL — ABNORMAL HIGH (ref <150)
Triglycerides: 462 MG/DL — ABNORMAL HIGH (ref <150)

## 2018-11-08 LAB — COMPREHENSIVE METABOLIC PANEL
ALT: 28 U/L (ref 12–78)
AST: 13 U/L — ABNORMAL LOW (ref 15–37)
Albumin/Globulin Ratio: 1.1 (ref 1.1–2.2)
Albumin: 3.8 g/dL (ref 3.5–5.0)
Alkaline Phosphatase: 64 U/L (ref 45–117)
Anion Gap: 4 mmol/L — ABNORMAL LOW (ref 5–15)
BUN: 19 MG/DL (ref 6–20)
Bun/Cre Ratio: 25 — ABNORMAL HIGH (ref 12–20)
CO2: 32 mmol/L (ref 21–32)
Calcium: 8.6 MG/DL (ref 8.5–10.1)
Chloride: 103 mmol/L (ref 97–108)
Creatinine: 0.77 MG/DL (ref 0.55–1.02)
EGFR IF NonAfrican American: >60 mL/min/{1.73_m2} (ref >60)
GFR African American: >60 mL/min/{1.73_m2} (ref >60)
Globulin: 3.4 g/dL (ref 2.0–4.0)
Glucose: 143 mg/dL — ABNORMAL HIGH (ref 65–100)
Potassium: 3.8 mmol/L (ref 3.5–5.1)
Sodium: 139 mmol/L (ref 136–145)
Total Bilirubin: 0.5 MG/DL (ref 0.2–1.0)
Total Protein: 7.2 g/dL (ref 6.4–8.2)

## 2018-11-08 LAB — LDL CHOLESTEROL, DIRECT: LDL Direct: 75 mg/dl (ref 0–100)

## 2018-11-08 LAB — VITAMIN D 25 HYDROXY: Vit D, 25-Hydroxy: 60 ng/mL (ref 30–100)

## 2018-11-08 LAB — HEMOGLOBIN A1C W/EAG
Hemoglobin A1C: 6.6 % — ABNORMAL HIGH (ref 4.0–5.6)
eAG: 143 mg/dL

## 2018-11-08 LAB — METABOLIC PANEL, COMPREHENSIVE
A-G Ratio: 1.1 (ref 1.1–2.2)
ALT (SGPT): 28 U/L (ref 12–78)
AST (SGOT): 13 U/L — ABNORMAL LOW (ref 15–37)
Albumin: 3.8 g/dL (ref 3.5–5.0)
Alk. phosphatase: 64 U/L (ref 45–117)
Anion gap: 4 mmol/L — ABNORMAL LOW (ref 5–15)
BUN/Creatinine ratio: 25 — ABNORMAL HIGH (ref 12–20)
BUN: 19 MG/DL (ref 6–20)
Bilirubin, total: 0.5 MG/DL (ref 0.2–1.0)
CO2: 32 mmol/L (ref 21–32)
Calcium: 8.6 MG/DL (ref 8.5–10.1)
Chloride: 103 mmol/L (ref 97–108)
Creatinine: 0.77 MG/DL (ref 0.55–1.02)
GFR est AA: >60 mL/min/{1.73_m2} (ref >60)
GFR est non-AA: >60 mL/min/{1.73_m2} (ref >60)
Globulin: 3.4 g/dL (ref 2.0–4.0)
Glucose: 143 mg/dL — ABNORMAL HIGH (ref 65–100)
Potassium: 3.8 mmol/L (ref 3.5–5.1)
Protein, total: 7.2 g/dL (ref 6.4–8.2)
Sodium: 139 mmol/L (ref 136–145)

## 2018-11-08 LAB — LDL, DIRECT: LDL,Direct: 75 mg/dl (ref 0–100)

## 2018-11-08 LAB — VITAMIN D, 25 HYDROXY: Vitamin D 25-Hydroxy: 60 ng/mL (ref 30–100)

## 2018-11-08 LAB — HEMOGLOBIN A1C WITH EAG
Est. average glucose: 143 mg/dL
Hemoglobin A1c: 6.6 % — ABNORMAL HIGH (ref 4.0–5.6)

## 2018-11-08 NOTE — Progress Notes (Signed)
Vit D level is normal.    Your diabetes worsened slightly and your triglycerides went up as well.  Please decrease sugars and carbs.  LDL or bad cholesterol could not be calculated due to the triglycerides being elevated.  Recheck labs 3-6 months.

## 2018-11-09 MED ORDER — ONDANSETRON HCL 4 MG/2ML IJ SOLN
INTRAMUSCULAR | Status: AC
  Filled 2018-11-09: qty 2

## 2018-11-09 MED ORDER — DEXAMETHASONE SODIUM PHOSPHATE 10 MG/ML IJ SOLN
INTRAMUSCULAR | Status: AC
  Filled 2018-11-09: qty 1

## 2018-11-09 MED ORDER — WHITE PETROLATUM EX OINT
TOPICAL_OINTMENT | CUTANEOUS | Status: AC
  Filled 2018-11-09: qty 20

## 2018-11-09 MED ORDER — LIDOCAINE 2% (20 MG/ML) 5 ML SYRINGE
INTRAMUSCULAR | Status: AC
  Filled 2018-11-09: qty 5

## 2018-11-09 MED ORDER — PROPOFOL 10 MG/ML IV BOLUS
INTRAVENOUS | Status: AC
  Filled 2018-11-09: qty 20

## 2018-11-09 MED ORDER — EPHEDRINE 5 MG/ML INJ
INTRAVENOUS | Status: AC
  Filled 2018-11-09: qty 20

## 2018-11-09 MED ORDER — FENTANYL CITRATE (PF) 100 MCG/2ML IJ SOLN
INTRAMUSCULAR | Status: AC
  Filled 2018-11-09: qty 2

## 2018-11-19 ENCOUNTER — Inpatient Hospital Stay: Payer: Medicare Other | Attending: Gynecologic Oncology

## 2018-11-19 ENCOUNTER — Inpatient Hospital Stay: Payer: Medicare Other

## 2018-11-19 DIAGNOSIS — Z95828 Presence of other vascular implants and grafts: Secondary | ICD-10-CM

## 2018-11-19 DIAGNOSIS — Z5111 Encounter for antineoplastic chemotherapy: Secondary | ICD-10-CM | POA: Diagnosis not present

## 2018-11-19 DIAGNOSIS — C5702 Malignant neoplasm of left fallopian tube: Secondary | ICD-10-CM | POA: Insufficient documentation

## 2018-11-19 DIAGNOSIS — D61818 Other pancytopenia: Secondary | ICD-10-CM | POA: Insufficient documentation

## 2018-11-19 DIAGNOSIS — C5700 Malignant neoplasm of unspecified fallopian tube: Secondary | ICD-10-CM

## 2018-11-19 LAB — CBC WITH DIFFERENTIAL/PLATELET
Abs Immature Granulocytes: 0.01 10*3/uL (ref 0.00–0.07)
Basophils Absolute: 0 10*3/uL (ref 0.0–0.1)
Basophils Relative: 0 %
Eosinophils Absolute: 0 10*3/uL (ref 0.0–0.5)
Eosinophils Relative: 1 %
HCT: 32.5 % — ABNORMAL LOW (ref 36.0–46.0)
Hemoglobin: 10.9 g/dL — ABNORMAL LOW (ref 12.0–15.0)
Immature Granulocytes: 0 %
Lymphocytes Relative: 27 %
Lymphs Abs: 0.8 10*3/uL (ref 0.7–4.0)
MCH: 33.5 pg (ref 26.0–34.0)
MCHC: 33.5 g/dL (ref 30.0–36.0)
MCV: 100 fL (ref 80.0–100.0)
Monocytes Absolute: 0.4 10*3/uL (ref 0.1–1.0)
Monocytes Relative: 15 %
NEUTROS PCT: 57 %
Neutro Abs: 1.6 10*3/uL — ABNORMAL LOW (ref 1.7–7.7)
Platelets: 192 10*3/uL (ref 150–400)
RBC: 3.25 MIL/uL — AB (ref 3.87–5.11)
RDW: 13.2 % (ref 11.5–15.5)
WBC: 2.9 10*3/uL — ABNORMAL LOW (ref 4.0–10.5)
nRBC: 0 % (ref 0.0–0.2)

## 2018-11-19 LAB — COMPREHENSIVE METABOLIC PANEL
ALT: 32 U/L (ref 0–44)
AST: 19 U/L (ref 15–41)
Albumin: 3.5 g/dL (ref 3.5–5.0)
Alkaline Phosphatase: 70 U/L (ref 38–126)
Anion gap: 7 (ref 5–15)
BUN: 13 mg/dL (ref 8–23)
CO2: 25 mmol/L (ref 22–32)
Calcium: 8.9 mg/dL (ref 8.9–10.3)
Chloride: 106 mmol/L (ref 98–111)
Creatinine, Ser: 0.7 mg/dL (ref 0.44–1.00)
GFR calc Af Amer: >60 mL/min (ref >60)
GFR calc non Af Amer: >60 mL/min (ref >60)
Glucose, Bld: 98 mg/dL (ref 70–99)
Potassium: 4.2 mmol/L (ref 3.5–5.1)
Sodium: 138 mmol/L (ref 135–145)
Total Bilirubin: 0.4 mg/dL (ref 0.3–1.2)
Total Protein: 6.3 g/dL — ABNORMAL LOW (ref 6.5–8.1)

## 2018-11-19 MED ORDER — SODIUM CHLORIDE 0.9% FLUSH
10.0000 mL | Freq: Once | INTRAVENOUS | Status: AC
  Administered 2018-11-19: 10 mL
  Filled 2018-11-19: qty 10

## 2018-11-19 MED ORDER — HEPARIN SOD (PORK) LOCK FLUSH 100 UNIT/ML IV SOLN
500.0000 [IU] | Freq: Once | INTRAVENOUS | Status: AC
  Administered 2018-11-19: 500 [IU]
  Filled 2018-11-19: qty 5

## 2018-11-20 ENCOUNTER — Telehealth: Payer: Self-pay | Admitting: Hematology and Oncology

## 2018-11-20 ENCOUNTER — Inpatient Hospital Stay (HOSPITAL_BASED_OUTPATIENT_CLINIC_OR_DEPARTMENT_OTHER): Payer: Medicare Other | Admitting: Hematology and Oncology

## 2018-11-20 ENCOUNTER — Encounter: Payer: Self-pay | Admitting: Hematology and Oncology

## 2018-11-20 ENCOUNTER — Inpatient Hospital Stay: Payer: Medicare Other

## 2018-11-20 ENCOUNTER — Telehealth: Payer: Self-pay

## 2018-11-20 VITALS — BP 117/67 | HR 72 | Temp 98.4°F | Resp 17 | Ht 62.0 in | Wt 152.2 lb

## 2018-11-20 DIAGNOSIS — C5702 Malignant neoplasm of left fallopian tube: Secondary | ICD-10-CM

## 2018-11-20 DIAGNOSIS — D61818 Other pancytopenia: Secondary | ICD-10-CM

## 2018-11-20 DIAGNOSIS — C5700 Malignant neoplasm of unspecified fallopian tube: Secondary | ICD-10-CM

## 2018-11-20 DIAGNOSIS — Z5111 Encounter for antineoplastic chemotherapy: Secondary | ICD-10-CM

## 2018-11-20 LAB — CA 125: Cancer Antigen (CA) 125: 21.7 U/mL (ref 0.0–38.1)

## 2018-11-20 MED ORDER — SODIUM CHLORIDE 0.9% FLUSH
10.0000 mL | INTRAVENOUS | Status: DC | PRN
  Administered 2018-11-20: 10 mL
  Filled 2018-11-20: qty 10

## 2018-11-20 MED ORDER — DOXORUBICIN HCL LIPOSOMAL CHEMO INJECTION 2 MG/ML
50.0000 mg | Freq: Once | INTRAVENOUS | Status: AC
  Administered 2018-11-20: 50 mg via INTRAVENOUS
  Filled 2018-11-20: qty 25

## 2018-11-20 MED ORDER — HEPARIN SOD (PORK) LOCK FLUSH 100 UNIT/ML IV SOLN
500.0000 [IU] | Freq: Once | INTRAVENOUS | Status: AC | PRN
  Administered 2018-11-20: 500 [IU]
  Filled 2018-11-20: qty 5

## 2018-11-20 MED ORDER — DEXTROSE 5 % IV SOLN
Freq: Once | INTRAVENOUS | Status: AC
  Administered 2018-11-20: 10:00:00 via INTRAVENOUS
  Filled 2018-11-20: qty 250

## 2018-11-20 MED ORDER — DEXAMETHASONE SODIUM PHOSPHATE 10 MG/ML IJ SOLN
10.0000 mg | Freq: Once | INTRAMUSCULAR | Status: AC
  Administered 2018-11-20: 10 mg via INTRAVENOUS

## 2018-11-20 MED ORDER — FAMOTIDINE IN NACL 20-0.9 MG/50ML-% IV SOLN
20.0000 mg | Freq: Once | INTRAVENOUS | Status: AC
  Administered 2018-11-20: 20 mg via INTRAVENOUS

## 2018-11-20 MED ORDER — DIPHENHYDRAMINE HCL 25 MG PO CAPS
25.0000 mg | ORAL_CAPSULE | Freq: Once | ORAL | Status: AC
  Administered 2018-11-20: 25 mg via ORAL

## 2018-11-20 MED ORDER — DEXAMETHASONE SODIUM PHOSPHATE 10 MG/ML IJ SOLN
INTRAMUSCULAR | Status: AC
  Filled 2018-11-20: qty 1

## 2018-11-20 MED ORDER — SODIUM CHLORIDE 0.9 % IV SOLN
380.0000 mg | Freq: Once | INTRAVENOUS | Status: AC
  Administered 2018-11-20: 380 mg via INTRAVENOUS
  Filled 2018-11-20: qty 38

## 2018-11-20 MED ORDER — PALONOSETRON HCL INJECTION 0.25 MG/5ML
INTRAVENOUS | Status: AC
  Filled 2018-11-20: qty 5

## 2018-11-20 MED ORDER — FAMOTIDINE IN NACL 20-0.9 MG/50ML-% IV SOLN
INTRAVENOUS | Status: AC
  Filled 2018-11-20: qty 50

## 2018-11-20 MED ORDER — PALONOSETRON HCL INJECTION 0.25 MG/5ML
0.2500 mg | Freq: Once | INTRAVENOUS | Status: AC
  Administered 2018-11-20: 0.25 mg via INTRAVENOUS

## 2018-11-20 MED ORDER — DIPHENHYDRAMINE HCL 25 MG PO CAPS
ORAL_CAPSULE | ORAL | Status: AC
  Filled 2018-11-20: qty 1

## 2018-11-20 NOTE — Patient Instructions (Signed)
[facility] Discharge Instructions for Patients Receiving Chemotherapy  Today you received the following chemotherapy agents Doxil and Carboplatin  To help prevent nausea and vomiting after your treatment, we encourage you to take your nausea medication as directed.   If you develop nausea and vomiting that is not controlled by your nausea medication, call the clinic.   BELOW ARE SYMPTOMS THAT SHOULD BE REPORTED IMMEDIATELY:  *FEVER GREATER THAN 100.5 F  *CHILLS WITH OR WITHOUT FEVER  NAUSEA AND VOMITING THAT IS NOT CONTROLLED WITH YOUR NAUSEA MEDICATION  *UNUSUAL SHORTNESS OF BREATH  *UNUSUAL BRUISING OR BLEEDING  TENDERNESS IN MOUTH AND THROAT WITH OR WITHOUT PRESENCE OF ULCERS  *URINARY PROBLEMS  *BOWEL PROBLEMS  UNUSUAL RASH Items with * indicate a potential emergency and should be followed up as soon as possible.  Feel free to call the clinic should you have any questions or concerns. The clinic phone number is [phone].  Please show the CHEMO ALERT CARD at check-in to the Emergency Department and triage nurse.   

## 2018-11-20 NOTE — Telephone Encounter (Signed)
Called and scheduled echo appt for this Friday 2/7 at 10 am, check in at admitting at [facility] at [place]. Given message in the infusion room.

## 2018-11-20 NOTE — Assessment & Plan Note (Signed)
I have reviewed the recommendation with the patient's and GYN oncologist She will proceed with 3 more cycles of treatment I will order echocardiogram at the end of the week She tolerated treatment very well recently without major side effects except for pancytopenia

## 2018-11-20 NOTE — Assessment & Plan Note (Signed)
She is not symptomatic.  She does not need G-CSF support for now

## 2018-11-20 NOTE — Telephone Encounter (Signed)
Gave avs and calendar ° °

## 2018-11-20 NOTE — Progress Notes (Signed)
[facility] OFFICE PROGRESS NOTE  Patient Care Team: [name], MD as PCP - General (Internal Medicine)  ASSESSMENT & PLAN:  Fallopian tube cancer, carcinoma ([place]) I have reviewed the recommendation with the patient's and GYN oncologist She will proceed with 3 more cycles of treatment I will order echocardiogram at the end of the week She tolerated treatment very well recently without major side effects except for pancytopenia  Pancytopenia, acquired [facility]) She is not symptomatic.  She does not need G-CSF support for now   Orders Placed This Encounter  Procedures  . ECHOCARDIOGRAM COMPLETE    Standing Status:   Future    Standing Expiration Date:   02/18/2020    Order Specific Question:   Where should this test be performed    Answer:   [place]    Order Specific Question:   Perflutren DEFINITY (image enhancing agent) should be administered unless hypersensitivity or allergy exist    Answer:   Administer Perflutren    Order Specific Question:   Reason for exam-Echo    Answer:   Chemo  V67.2 / Z09    INTERVAL HISTORY: Please see below for problem oriented charting. 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 Extensive omental nodularity highly worrisome for peritoneal carcinomatosis. Late recurrence of melanoma can present as peritoneal carcinomatosis. Ovarian cancer more commonly presents in this manner. Patient  does have a predominately low density right adnexal lesion measuring up to 3.5 cm, although this lesion is not typical for ovarian cancer. 2. No evidence of bowel or ureteral obstruction. 3. No other definite signs of metastatic disease. There are small indeterminate low-density hepatic lesions.    02/08/2016 Tumor Marker    Patient's tumor was tested for the following markers: CA-125 Results of the tumor marker test revealed 656.2    02/15/2016 Procedure    CT-guided core biopsy performed of omental mass just deep to the abdominal wall.    02/15/2016 Pathology Results    Omentum, biopsy, left - PAPILLARY SEROUS NEOPLASM, SEE COMMENT. Microscopic Comment There are papillary collections of largely low grade appearing cells with numerous psammoma bodies. Given the limited material it is difficult to distinguish between invasive implants of a serous borderline tumor and serous carcinoma, especially given the low grade appearance.    02/18/2016 Pathology Results    1. Omentum, resection for tumor INVASIVE IMPLANT OF HIGH GRADE SEROUS CARCINOMA 2. Uterus +/- tubes/ovaries, neoplastic HIGH GRADE SEROUS CARCINOMA INVOLVING LEFT TUBAL FIMBRIA SEROUS CARCINOMA WITH PREDOMINANT PSAMMOMA BODIES IMPLANT AT UTERUS SEROSA, BILATERAL FALLOPIAN TUBAL SEROSA, AND ANTERIOR PERITONEAL REFLECTION CERVIX: HISTOLOGICAL UNREMARKABLE ENDOMETRIUM: INACTIVE ENDOMETRIUM MYOMETRIUM: LEIOMYOMA LEFT OVARY: CYSTADENOFIBROMA RIGHT OVARY AND FALLOPIAN TUBE: HISTOLOGICAL UNREMARKABLE Microscopic Comment 2. ONCOLOGY TABLE - FALLOPIAN TUBE 1. Specimen, including laterality: Omentum, uterus, bilateral ovaries and fallopian tubes 2. Procedure: Hysterectomy, bilateral salpingo-oophorectomy and tumor debulking omentectomy 3. Lymph node sampling performed: No 4. Tumor  site: uterus serosa, bilateral fallopian tubal serosa, peritoneal and omentum 5. Tumor location in fallopian tube: Left fallopian tubal fimbria 6. Specimen  integrity (intact/ruptured/disrupted): Intact 7. Tumor size (cm): multi focal invasive omentum implant greater than 2 cm, left fallopian tube tumor 0.8 cm. 8. Histologic type: Serous carcinoma 9. Grade: 3 10. Microscopic tumor extension: uterus serosa, bilateral fallopian tubal serosa, peritoneal and omentum 11. Margins: NA 12. Lymph-Vascular invasion: identified 13. Lymph nodes: # examined: 0; # positive: NA 14. TNM: pT3c, pNx 15. FIGO Stage (based on pathologic findings, needs clinical correlation: IIIC 16. Comment: High grade serous carcinoma multifocally and extensively involves left fallopian tubal fimbria, omentum, uterus serosa, bilateral fallopian tubal serosa and peritoneal. At the left fallopian tube there are small foci of serous tubal intraepithelial carcinoma identified, so we conclude the carcinoma is fallopian tube origin    03/09/2016 Tumor Marker    Patient's tumor was tested for the following markers: CA-125 Results of the tumor marker test revealed 154.4    03/15/2016 Procedure    Technically successful right IJ power-injectable port catheter placement. Ready for routine use.    03/18/2016 - 07/13/2016 Chemotherapy    The patient had 6 cycles of carboplatin and taxol    04/14/2016 Tumor Marker    Patient's tumor was tested for the following markers: CA-125 Results of the tumor marker test revealed 36.7    04/25/2016 Genetic Testing    Patient has genetic testing done for germline mutation Results revealed patient has no mutation    04/28/2016 Tumor Marker    Patient's tumor was tested for the following markers: CA-125 Results of the tumor marker test revealed 24.6    06/21/2016 Tumor Marker    Patient's tumor was tested for the following markers: CA-125 Results of the tumor marker test revealed 16.6    08/01/2016 Tumor Marker    Patient's tumor was tested for the following markers: CA-125 Results of the tumor marker test revealed 17.2    08/05/2016 Imaging     Interval TAH-BSO. No evidence of residual pelvic mass or metastatic disease within the abdomen or pelvis. No other acute findings.     11/04/2016 Tumor Marker    Patient's tumor was tested for the following markers: CA-125 Results of the tumor marker test revealed 13.6    01/20/2017 Tumor Marker    Patient's tumor was tested for the following markers: CA-125 Results of the tumor marker test revealed 13.8    04/14/2017 Tumor Marker    Patient's tumor was tested for the following markers: CA-125 Results of the tumor marker test revealed 16.1    06/13/2017 Imaging    No acute findings.  No mass or hernia identified.  Colonic diverticulosis, without radiographic evidence of diverticulitis.  Aortic atherosclerosis.    07/03/2017 Tumor Marker    Patient's tumor was tested for the following markers: CA-125 Results of the tumor marker test revealed 18.4    09/19/2017 Tumor Marker    Patient's tumor was tested for the following markers: CA-125 Results of the tumor marker test revealed 18.5    01/23/2018 Tumor Marker    Patient's tumor was tested for the following markers: CA-125 Results of the tumor marker test revealed 35.4    01/30/2018 Imaging    1. No evidence of local fallopian tube carcinoma recurrence within the pelvis. Post hysterectomy. 2. No evidence of metastatic peritoneal disease or omental disease. No solid organ metastasis.     03/20/2018 Tumor Marker    Patient's tumor was tested  for the following markers: CA-125 Results of the tumor marker test revealed 73    05/02/2018 Tumor Marker    Patient's tumor was tested for the following markers: CA-125 Results of the tumor marker test revealed 127.1    05/08/2018 Imaging    CT abdomen and pelvis 1. 7 cm left subdiaphragmatic collection of loculated fluid versus low-density soft tissue. Given the patient's history of rising CA 125, metastatic disease considered highly likely.  2. Areas of fluid or recurrent disease identified  in the right lower quadrant adjacent to the cecum and along right lower quadrant small bowel loops.    05/14/2018 Tumor Marker    Patient's tumor was tested for the following markers: CA-125 Results of the tumor marker test revealed 166     Genetic Testing    Patient has genetic testing done for ER/PR and MMR. Results revealed patient has the following on 02/18/2016 surgical pathology: ER 20%, PR 0% MMR: normal     Genetic Testing    Patient has genetic testing done for MSI. Results revealed patient has the following: MSI stable    05/18/2018 -  Chemotherapy    The patient had carboplatin and Doxil    07/16/2018 Tumor Marker    Patient's tumor was tested for the following markers: CA-125 Results of the tumor marker test revealed 28.7    08/10/2018 Imaging    CT imaging:  Decreased size of peritoneal soft tissue masses in left upper quadrant, consistent with decreased metastatic disease.  No new or progressive metastatic disease. No other acute findings.  Colonic diverticulosis, without radiographic evidence of diverticulitis.  Stable small paraumbilical ventral hernia containing transverse colon.    08/13/2018 Tumor Marker    Patient's tumor was tested for the following markers: CA-125 Results of the tumor marker test revealed 21.6    08/24/2018 Echocardiogram    LV EF: 60% -  65%    10/15/2018 Tumor Marker    Patient's tumor was tested for the following markers: CA-125 Results of the tumor marker test revealed 20.1    10/19/2018 Imaging    Ct scan of abdomen and pelvis Status post hysterectomy, bilateral salpingo-oophorectomy, and omentectomy.  Two peritoneal implants in the left upper abdomen are stable versus mildly decreased, as above. No abdominopelvic ascites.  No evidence of new/progressive metastatic disease.      11/19/2018 Tumor Marker    Patient's tumor was tested for the following markers: CA-125 Results of the tumor marker test revealed 21.7      REVIEW OF SYSTEMS:   Constitutional: Denies fevers, chills or abnormal weight loss Eyes: Denies blurriness of vision Ears, nose, mouth, throat, and face: Denies mucositis or sore throat Respiratory: Denies cough, dyspnea or wheezes Cardiovascular: Denies palpitation, chest discomfort or lower extremity swelling Gastrointestinal:  Denies nausea, heartburn or change in bowel habits Skin: Denies abnormal skin rashes Lymphatics: Denies new lymphadenopathy or easy bruising Neurological:Denies numbness, tingling or new weaknesses Behavioral/Psych: Mood is stable, no new changes  All other systems were reviewed with the patient and are negative.  I have reviewed the past medical history, past surgical history, social history and family history with the patient and they are unchanged from previous note.  ALLERGIES:  has No Known Allergies.  MEDICATIONS:  Current Outpatient Medications  Medication Sig Dispense Refill  . Cholecalciferol (VITAMIN D) 2000 units CAPS Take 1 capsule by mouth daily.    Marland Kitchen ezetimibe (ZETIA) 10 MG tablet Take 10 mg by mouth daily.     No  current facility-administered medications for this visit.     PHYSICAL EXAMINATION: ECOG PERFORMANCE STATUS: 0 - Asymptomatic  Vitals:   11/20/18 0901  BP: 117/67  Pulse: 72  Resp: 17  Temp: 98.4 F (36.9 C)  SpO2: 98%   Filed Weights   11/20/18 0901  Weight: 152 lb 3.2 oz (69 kg)    GENERAL:alert, no distress and comfortable SKIN: skin color, texture, turgor are normal, no rashes or significant lesions EYES: normal, Conjunctiva are pink and non-injected, sclera clear OROPHARYNX:no exudate, no erythema and lips, buccal mucosa, and tongue normal  NECK: supple, thyroid normal size, non-tender, without nodularity LYMPH:  no palpable lymphadenopathy in the cervical, axillary or inguinal LUNGS: clear to auscultation and percussion with normal breathing effort HEART: regular rate & rhythm and no murmurs and no lower  extremity edema ABDOMEN:abdomen soft, non-tender and normal bowel sounds Musculoskeletal:no cyanosis of digits and no clubbing  NEURO: alert & oriented x 3 with fluent speech, no focal motor/sensory deficits  LABORATORY DATA:  I have reviewed the data as listed    Component Value Date/Time   NA 138 11/19/2018 0850   NA 139 06/07/2017 1430   K 4.2 11/19/2018 0850   K 3.8 06/07/2017 1430   CL 106 11/19/2018 0850   CO2 25 11/19/2018 0850   CO2 27 06/07/2017 1430   GLUCOSE 98 11/19/2018 0850   GLUCOSE 124 06/07/2017 1430   BUN 13 11/19/2018 0850   BUN 14.0 06/07/2017 1430   CREATININE 0.70 11/19/2018 0850   CREATININE 0.8 06/07/2017 1430   CALCIUM 8.9 11/19/2018 0850   CALCIUM 9.3 06/07/2017 1430   PROT 6.3 (L) 11/19/2018 0850   PROT 6.6 09/30/2016 1027   ALBUMIN 3.5 11/19/2018 0850   ALBUMIN 3.5 09/30/2016 1027   AST 19 11/19/2018 0850   AST 18 09/30/2016 1027   ALT 32 11/19/2018 0850   ALT 21 09/30/2016 1027   ALKPHOS 70 11/19/2018 0850   ALKPHOS 77 09/30/2016 1027   BILITOT 0.4 11/19/2018 0850   BILITOT 0.37 09/30/2016 1027   GFRNONAA >60 11/19/2018 0850   GFRAA >60 11/19/2018 0850    No results found for: SPEP, UPEP  Lab Results  Component Value Date   WBC 2.9 (L) 11/19/2018   NEUTROABS 1.6 (L) 11/19/2018   HGB 10.9 (L) 11/19/2018   HCT 32.5 (L) 11/19/2018   MCV 100.0 11/19/2018   PLT 192 11/19/2018      Chemistry      Component Value Date/Time   NA 138 11/19/2018 0850   NA 139 06/07/2017 1430   K 4.2 11/19/2018 0850   K 3.8 06/07/2017 1430   CL 106 11/19/2018 0850   CO2 25 11/19/2018 0850   CO2 27 06/07/2017 1430   BUN 13 11/19/2018 0850   BUN 14.0 06/07/2017 1430   CREATININE 0.70 11/19/2018 0850   CREATININE 0.8 06/07/2017 1430      Component Value Date/Time   CALCIUM 8.9 11/19/2018 0850   CALCIUM 9.3 06/07/2017 1430   ALKPHOS 70 11/19/2018 0850   ALKPHOS 77 09/30/2016 1027   AST 19 11/19/2018 0850   AST 18 09/30/2016 1027   ALT 32  11/19/2018 0850   ALT 21 09/30/2016 1027   BILITOT 0.4 11/19/2018 0850   BILITOT 0.37 09/30/2016 1027       RADIOGRAPHIC STUDIES: I have personally reviewed the radiological images as listed and agreed with the findings in the report. Ct Abdomen Pelvis W Contrast  Result Date: 10/22/2018 CLINICAL DATA:  Flow open  tube cancer diagnosed 2017, status post hysterectomy and omentectomy, chemotherapy in progress EXAM: CT ABDOMEN AND PELVIS WITH CONTRAST TECHNIQUE: Multidetector CT imaging of the abdomen and pelvis was performed using the standard protocol following bolus administration of intravenous contrast. CONTRAST:  17m OMNIPAQUE IOHEXOL 300 MG/ML  SOLN COMPARISON:  08/10/2018 FINDINGS: Lower chest: Minimal dependent atelectasis at the lung bases. Hepatobiliary: Liver is within normal limits. No suspicious/enhancing hepatic lesions. Stable 4 mm probable cyst inferiorly in the right hepatic lobe (series 2/image 29). Gallbladder is unremarkable. No intrahepatic or extrahepatic ductal dilatation. Pancreas: Within normal limits. Spleen: Within normal limits. Adrenals/Urinary Tract: Mild nodular thickening of the left adrenal gland, without discrete mass. Right adrenal glands are within normal limits. Kidneys are within normal limits. Bilateral renal sinus cysts, left greater than right. No hydronephrosis. Bladder is thick-walled although underdistended. Stomach/Bowel: Stomach is within normal limits. No evidence of bowel obstruction. Normal appendix (series 2/image 66). Left colonic diverticulosis, without evidence of diverticulitis. Vascular/Lymphatic: No evidence of abdominal aortic aneurysm. Atherosclerotic calcifications of the abdominal aorta and branch vessels. No suspicious abdominopelvic lymphadenopathy. Reproductive: Status post hysterectomy and bilateral salpingo-oophorectomy. Other: No abdominopelvic ascites. Status post omentectomy. Two peritoneal implants in the left upper abdomen, as follows:  --Dominant lesion measures 3.7 x 3.0 cm, previously 4.2 x 2.7 cm, grossly unchanged --Inferior lesion measures 2.3 x 1.8 cm (series 2/image 14), previously 2.5 x 2.2 cm, mildly decreased Status post small right paramidline ventral hernia containing a knuckle of transverse colon (series 2/image 47), unchanged. Musculoskeletal: Mild degenerative changes of the visualized thoracolumbar spine. IMPRESSION: Status post hysterectomy, bilateral salpingo-oophorectomy, and omentectomy. Two peritoneal implants in the left upper abdomen are stable versus mildly decreased, as above. No abdominopelvic ascites. No evidence of new/progressive metastatic disease. Electronically Signed   By: SJulian HyM.D.   On: 10/22/2018 08:12    All questions were answered. The patient knows to call the clinic with any problems, questions or concerns. No barriers to learning was detected.  I spent 15 minutes counseling the patient face to face. The total time spent in the appointment was 20 minutes and more than 50% was on counseling and review of test results  NHeath Lark MD 11/20/2018 9:33 AM

## 2018-11-23 ENCOUNTER — Telehealth: Payer: Self-pay | Admitting: *Deleted

## 2018-11-23 ENCOUNTER — Ambulatory Visit (HOSPITAL_COMMUNITY)
Admission: RE | Admit: 2018-11-23 | Discharge: 2018-11-23 | Disposition: A | Discharge status: Home / Self Care | Payer: Medicare Other | Source: Ambulatory Visit | Attending: Hematology and Oncology | Admitting: Hematology and Oncology

## 2018-11-23 ENCOUNTER — Other Ambulatory Visit: Payer: Self-pay | Admitting: Hematology and Oncology

## 2018-11-23 ENCOUNTER — Encounter

## 2018-11-23 DIAGNOSIS — Z8679 Personal history of other diseases of the circulatory system: Secondary | ICD-10-CM | POA: Diagnosis not present

## 2018-11-23 DIAGNOSIS — E785 Hyperlipidemia, unspecified: Secondary | ICD-10-CM | POA: Insufficient documentation

## 2018-11-23 DIAGNOSIS — C5700 Malignant neoplasm of unspecified fallopian tube: Secondary | ICD-10-CM | POA: Diagnosis not present

## 2018-11-23 DIAGNOSIS — Z5111 Encounter for antineoplastic chemotherapy: Secondary | ICD-10-CM

## 2018-11-23 NOTE — Progress Notes (Signed)
  Echocardiogram 2D Echocardiogram has been performed.  Darlina Sicilian M 11/23/2018, 10:45 AM

## 2018-11-23 NOTE — Telephone Encounter (Signed)
Called pt & informed that ECHO OK per Dr Calton Dach instructions.

## 2018-11-23 NOTE — Telephone Encounter (Signed)
-----   Message from [name], MD sent at 11/23/2018  2:04 PM EST ----- Regarding: echo is ok Pls let her know echo is ok

## 2018-11-26 MED ORDER — ALENDRONATE 35 MG TAB
35 mg | ORAL_TABLET | ORAL | 4 refills | Status: DC
Start: 2018-11-26 — End: 2019-12-13

## 2018-12-11 ENCOUNTER — Telehealth: Payer: Self-pay | Admitting: Oncology

## 2018-12-11 NOTE — Telephone Encounter (Signed)
Jessica Johnston called and wanted to make sure she has a flush appointment for when she returns from [place].  Advised her the next flush appointment is on March 16.  She verbalized agreement.

## 2018-12-25 MED ORDER — VALSARTAN-HYDROCHLOROTHIAZIDE 80 MG-12.5 MG TAB
ORAL_TABLET | ORAL | 3 refills | Status: DC
Start: 2018-12-25 — End: 2019-04-23

## 2018-12-31 ENCOUNTER — Other Ambulatory Visit: Payer: Self-pay

## 2018-12-31 ENCOUNTER — Inpatient Hospital Stay: Payer: Medicare Other

## 2018-12-31 ENCOUNTER — Encounter: Payer: Self-pay | Admitting: Hematology and Oncology

## 2018-12-31 ENCOUNTER — Inpatient Hospital Stay: Payer: Medicare Other | Attending: Gynecologic Oncology

## 2018-12-31 ENCOUNTER — Inpatient Hospital Stay (HOSPITAL_BASED_OUTPATIENT_CLINIC_OR_DEPARTMENT_OTHER): Payer: Medicare Other | Admitting: Hematology and Oncology

## 2018-12-31 DIAGNOSIS — Z5111 Encounter for antineoplastic chemotherapy: Secondary | ICD-10-CM | POA: Diagnosis not present

## 2018-12-31 DIAGNOSIS — D61818 Other pancytopenia: Secondary | ICD-10-CM | POA: Diagnosis not present

## 2018-12-31 DIAGNOSIS — C5700 Malignant neoplasm of unspecified fallopian tube: Secondary | ICD-10-CM | POA: Diagnosis not present

## 2018-12-31 DIAGNOSIS — C5702 Malignant neoplasm of left fallopian tube: Secondary | ICD-10-CM | POA: Insufficient documentation

## 2018-12-31 DIAGNOSIS — Z95828 Presence of other vascular implants and grafts: Secondary | ICD-10-CM

## 2018-12-31 LAB — COMPREHENSIVE METABOLIC PANEL
ALK PHOS: 70 U/L (ref 38–126)
ALT: 29 U/L (ref 0–44)
AST: 18 U/L (ref 15–41)
Albumin: 3.6 g/dL (ref 3.5–5.0)
Anion gap: 10 (ref 5–15)
BUN: 14 mg/dL (ref 8–23)
CO2: 24 mmol/L (ref 22–32)
Calcium: 8.9 mg/dL (ref 8.9–10.3)
Chloride: 105 mmol/L (ref 98–111)
Creatinine, Ser: 0.82 mg/dL (ref 0.44–1.00)
GFR calc Af Amer: >60 mL/min (ref >60)
GFR calc non Af Amer: >60 mL/min (ref >60)
Glucose, Bld: 106 mg/dL — ABNORMAL HIGH (ref 70–99)
Potassium: 4.3 mmol/L (ref 3.5–5.1)
Sodium: 139 mmol/L (ref 135–145)
Total Bilirubin: 0.4 mg/dL (ref 0.3–1.2)
Total Protein: 6.8 g/dL (ref 6.5–8.1)

## 2018-12-31 LAB — CBC WITH DIFFERENTIAL/PLATELET
ABS IMMATURE GRANULOCYTES: 0 10*3/uL (ref 0.00–0.07)
Basophils Absolute: 0 10*3/uL (ref 0.0–0.1)
Basophils Relative: 0 %
Eosinophils Absolute: 0 10*3/uL (ref 0.0–0.5)
Eosinophils Relative: 1 %
HCT: 34.3 % — ABNORMAL LOW (ref 36.0–46.0)
HEMOGLOBIN: 11.4 g/dL — AB (ref 12.0–15.0)
IMMATURE GRANULOCYTES: 0 %
Lymphocytes Relative: 31 %
Lymphs Abs: 1 10*3/uL (ref 0.7–4.0)
MCH: 33.4 pg (ref 26.0–34.0)
MCHC: 33.2 g/dL (ref 30.0–36.0)
MCV: 100.6 fL — ABNORMAL HIGH (ref 80.0–100.0)
Monocytes Absolute: 0.5 10*3/uL (ref 0.1–1.0)
Monocytes Relative: 14 %
NEUTROS PCT: 54 %
Neutro Abs: 1.7 10*3/uL (ref 1.7–7.7)
Platelets: 220 10*3/uL (ref 150–400)
RBC: 3.41 MIL/uL — ABNORMAL LOW (ref 3.87–5.11)
RDW: 12.9 % (ref 11.5–15.5)
WBC: 3.1 10*3/uL — ABNORMAL LOW (ref 4.0–10.5)
nRBC: 0 % (ref 0.0–0.2)

## 2018-12-31 MED ORDER — SODIUM CHLORIDE 0.9% FLUSH
10.0000 mL | Freq: Once | INTRAVENOUS | Status: AC
  Administered 2018-12-31: 10 mL
  Filled 2018-12-31: qty 10

## 2018-12-31 MED ORDER — FAMOTIDINE IN NACL 20-0.9 MG/50ML-% IV SOLN: 20.0000 mg | Freq: Once | INTRAVENOUS | Status: DC

## 2018-12-31 MED ORDER — DEXTROSE 5 % IV SOLN
Freq: Once | INTRAVENOUS | Status: AC
  Administered 2018-12-31: 14:00:00 via INTRAVENOUS
  Filled 2018-12-31: qty 250

## 2018-12-31 MED ORDER — DEXAMETHASONE SODIUM PHOSPHATE 10 MG/ML IJ SOLN
10.0000 mg | Freq: Once | INTRAMUSCULAR | Status: AC
  Administered 2018-12-31: 10 mg via INTRAVENOUS

## 2018-12-31 MED ORDER — DOXORUBICIN HCL LIPOSOMAL CHEMO INJECTION 2 MG/ML
28.0000 mg/m2 | Freq: Once | INTRAVENOUS | Status: AC
  Administered 2018-12-31: 50 mg via INTRAVENOUS
  Filled 2018-12-31: qty 25

## 2018-12-31 MED ORDER — PALONOSETRON HCL INJECTION 0.25 MG/5ML
0.2500 mg | Freq: Once | INTRAVENOUS | Status: AC
  Administered 2018-12-31: 0.25 mg via INTRAVENOUS

## 2018-12-31 MED ORDER — SODIUM CHLORIDE 0.9 % IV SOLN
380.0000 mg | Freq: Once | INTRAVENOUS | Status: AC
  Administered 2018-12-31: 380 mg via INTRAVENOUS
  Filled 2018-12-31: qty 38

## 2018-12-31 MED ORDER — SODIUM CHLORIDE 0.9% FLUSH
10.0000 mL | INTRAVENOUS | Status: DC | PRN
  Administered 2018-12-31: 10 mL
  Filled 2018-12-31: qty 10

## 2018-12-31 MED ORDER — DIPHENHYDRAMINE HCL 25 MG PO CAPS
25.0000 mg | ORAL_CAPSULE | Freq: Once | ORAL | Status: AC
  Administered 2018-12-31: 25 mg via ORAL

## 2018-12-31 MED ORDER — SODIUM CHLORIDE 0.9 % IV SOLN
20.0000 mg | Freq: Once | INTRAVENOUS | Status: AC
  Administered 2018-12-31: 20 mg via INTRAVENOUS
  Filled 2018-12-31: qty 2

## 2018-12-31 MED ORDER — HEPARIN SOD (PORK) LOCK FLUSH 100 UNIT/ML IV SOLN
500.0000 [IU] | Freq: Once | INTRAVENOUS | Status: AC | PRN
  Administered 2018-12-31: 500 [IU]
  Filled 2018-12-31: qty 5

## 2018-12-31 NOTE — Assessment & Plan Note (Signed)
I have reviewed the recommendation with the patient's and GYN oncologist She will proceed with treatment She tolerated treatment very well recently without major side effects except for pancytopenia I plan to repeat imaging study after next month's treatment

## 2018-12-31 NOTE — Patient Instructions (Signed)
[facility] Discharge Instructions for Patients Receiving Chemotherapy  Today you received the following chemotherapy agents: Doxil, Carboplatin  To help prevent nausea and vomiting after your treatment, we encourage you to take your nausea medications as directed.   If you develop nausea and vomiting that is not controlled by your nausea medication, call the clinic.   BELOW ARE SYMPTOMS THAT SHOULD BE REPORTED IMMEDIATELY:  *FEVER GREATER THAN 100.5 F  *CHILLS WITH OR WITHOUT FEVER  NAUSEA AND VOMITING THAT IS NOT CONTROLLED WITH YOUR NAUSEA MEDICATION  *UNUSUAL SHORTNESS OF BREATH  *UNUSUAL BRUISING OR BLEEDING  TENDERNESS IN MOUTH AND THROAT WITH OR WITHOUT PRESENCE OF ULCERS  *URINARY PROBLEMS  *BOWEL PROBLEMS  UNUSUAL RASH Items with * indicate a potential emergency and should be followed up as soon as possible.  Feel free to call the clinic should you have any questions or concerns. The clinic phone number is [phone].  Please show the [facility] at check-in to the Emergency Department and triage nurse.

## 2018-12-31 NOTE — Assessment & Plan Note (Signed)
She is not symptomatic.  She does not need G-CSF support for now

## 2018-12-31 NOTE — Progress Notes (Signed)
[facility] OFFICE PROGRESS NOTE  Patient Care Team: [name], MD as PCP - General (Internal Medicine)  ASSESSMENT & PLAN:  Fallopian tube cancer, carcinoma ([place]) I have reviewed the recommendation with the patient's and GYN oncologist She will proceed with treatment She tolerated treatment very well recently without major side effects except for pancytopenia I plan to repeat imaging study after next month's treatment  Pancytopenia, acquired [facility]) She is not symptomatic.  She does not need G-CSF support for now   No orders of the defined types were placed in this encounter.   INTERVAL HISTORY: Please see below for problem oriented charting. She returns for chemotherapy and follow-up She feels well No recent nausea and vomiting Denies recent infection, fever or chills No abdominal bloating.  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 Extensive omental nodularity highly worrisome for peritoneal carcinomatosis. Late recurrence of melanoma can present as peritoneal carcinomatosis. Ovarian cancer more commonly presents in this manner. Patient does have a predominately low density right adnexal lesion measuring up to 3.5 cm, although this lesion is not typical for ovarian cancer. 2. No evidence of bowel or ureteral obstruction. 3. No other definite signs of metastatic disease. There are small indeterminate low-density hepatic lesions.    02/08/2016 Tumor Marker    Patient's tumor was tested for the following markers: CA-125 Results of the tumor marker test revealed 656.2    02/15/2016 Procedure    CT-guided core biopsy performed of  omental mass just deep to the abdominal wall.    02/15/2016 Pathology Results    Omentum, biopsy, left - PAPILLARY SEROUS NEOPLASM, SEE COMMENT. Microscopic Comment There are papillary collections of largely low grade appearing cells with numerous psammoma bodies. Given the limited material it is difficult to distinguish between invasive implants of a serous borderline tumor and serous carcinoma, especially given the low grade appearance.    02/18/2016 Pathology Results    1. Omentum, resection for tumor INVASIVE IMPLANT OF HIGH GRADE SEROUS CARCINOMA 2. Uterus +/- tubes/ovaries, neoplastic HIGH GRADE SEROUS CARCINOMA INVOLVING LEFT TUBAL FIMBRIA SEROUS CARCINOMA WITH PREDOMINANT PSAMMOMA BODIES IMPLANT AT UTERUS SEROSA, BILATERAL FALLOPIAN TUBAL SEROSA, AND ANTERIOR PERITONEAL REFLECTION CERVIX: HISTOLOGICAL UNREMARKABLE ENDOMETRIUM: INACTIVE ENDOMETRIUM MYOMETRIUM: LEIOMYOMA LEFT OVARY: CYSTADENOFIBROMA RIGHT OVARY AND FALLOPIAN TUBE: HISTOLOGICAL UNREMARKABLE Microscopic Comment 2. ONCOLOGY TABLE - FALLOPIAN TUBE 1. Specimen, including laterality: Omentum, uterus, bilateral ovaries and fallopian tubes 2. Procedure: Hysterectomy, bilateral salpingo-oophorectomy and tumor debulking omentectomy 3. Lymph node sampling performed: No 4. Tumor site: uterus serosa, bilateral fallopian tubal serosa, peritoneal and omentum 5. Tumor location in fallopian tube: Left fallopian tubal fimbria 6. Specimen integrity (intact/ruptured/disrupted): Intact 7. Tumor size (cm): multi focal invasive omentum implant greater than 2 cm, left fallopian tube tumor 0.8 cm. 8. Histologic type: Serous carcinoma 9. Grade: 3 10. Microscopic tumor extension: uterus serosa, bilateral fallopian tubal serosa, peritoneal and omentum 11. Margins: NA 12. Lymph-Vascular invasion: identified 13. Lymph nodes: # examined: 0; # positive: NA 14. TNM: pT3c, pNx 15. FIGO Stage (based on pathologic findings, needs clinical  correlation: IIIC 16. Comment: High grade serous carcinoma multifocally and extensively involves  left fallopian tubal fimbria, omentum, uterus serosa, bilateral fallopian tubal serosa and peritoneal. At the left fallopian tube there are small foci of serous tubal intraepithelial carcinoma identified, so we conclude the carcinoma is fallopian tube origin    03/09/2016 Tumor Marker    Patient's tumor was tested for the following markers: CA-125 Results of the tumor marker test revealed 154.4    03/15/2016 Procedure    Technically successful right IJ power-injectable port catheter placement. Ready for routine use.    03/18/2016 - 07/13/2016 Chemotherapy    The patient had 6 cycles of carboplatin and taxol    04/14/2016 Tumor Marker    Patient's tumor was tested for the following markers: CA-125 Results of the tumor marker test revealed 36.7    04/25/2016 Genetic Testing    Patient has genetic testing done for germline mutation Results revealed patient has no mutation    04/28/2016 Tumor Marker    Patient's tumor was tested for the following markers: CA-125 Results of the tumor marker test revealed 24.6    06/21/2016 Tumor Marker    Patient's tumor was tested for the following markers: CA-125 Results of the tumor marker test revealed 16.6    08/01/2016 Tumor Marker    Patient's tumor was tested for the following markers: CA-125 Results of the tumor marker test revealed 17.2    08/05/2016 Imaging    Interval TAH-BSO. No evidence of residual pelvic mass or metastatic disease within the abdomen or pelvis. No other acute findings.     11/04/2016 Tumor Marker    Patient's tumor was tested for the following markers: CA-125 Results of the tumor marker test revealed 13.6    01/20/2017 Tumor Marker    Patient's tumor was tested for the following markers: CA-125 Results of the tumor marker test revealed 13.8    04/14/2017 Tumor Marker    Patient's tumor was tested for the following markers:  CA-125 Results of the tumor marker test revealed 16.1    06/13/2017 Imaging    No acute findings.  No mass or hernia identified.  Colonic diverticulosis, without radiographic evidence of diverticulitis.  Aortic atherosclerosis.    07/03/2017 Tumor Marker    Patient's tumor was tested for the following markers: CA-125 Results of the tumor marker test revealed 18.4    09/19/2017 Tumor Marker    Patient's tumor was tested for the following markers: CA-125 Results of the tumor marker test revealed 18.5    01/23/2018 Tumor Marker    Patient's tumor was tested for the following markers: CA-125 Results of the tumor marker test revealed 35.4    01/30/2018 Imaging    1. No evidence of local fallopian tube carcinoma recurrence within the pelvis. Post hysterectomy. 2. No evidence of metastatic peritoneal disease or omental disease. No solid organ metastasis.     03/20/2018 Tumor Marker    Patient's tumor was tested for the following markers: CA-125 Results of the tumor marker test revealed 73    05/02/2018 Tumor Marker    Patient's tumor was tested for the following markers: CA-125 Results of the tumor marker test revealed 127.1    05/08/2018 Imaging    CT abdomen and pelvis 1. 7 cm left subdiaphragmatic collection of loculated fluid versus low-density soft tissue. Given the patient's history of rising CA 125, metastatic disease considered highly likely.  2. Areas of fluid or recurrent disease identified in the right lower quadrant adjacent to the cecum and along right lower quadrant small bowel loops.    05/14/2018  Tumor Marker    Patient's tumor was tested for the following markers: CA-125 Results of the tumor marker test revealed 166     Genetic Testing    Patient has genetic testing done for ER/PR and MMR. Results revealed patient has the following on 02/18/2016 surgical pathology: ER 20%, PR 0% MMR: normal     Genetic Testing    Patient has genetic testing done for MSI. Results  revealed patient has the following: MSI stable    05/18/2018 -  Chemotherapy    The patient had carboplatin and Doxil    07/16/2018 Tumor Marker    Patient's tumor was tested for the following markers: CA-125 Results of the tumor marker test revealed 28.7    08/10/2018 Imaging    CT imaging:  Decreased size of peritoneal soft tissue masses in left upper quadrant, consistent with decreased metastatic disease.  No new or progressive metastatic disease. No other acute findings.  Colonic diverticulosis, without radiographic evidence of diverticulitis.  Stable small paraumbilical ventral hernia containing transverse colon.    08/13/2018 Tumor Marker    Patient's tumor was tested for the following markers: CA-125 Results of the tumor marker test revealed 21.6    08/24/2018 Echocardiogram    LV EF: 60% -  65%    10/15/2018 Tumor Marker    Patient's tumor was tested for the following markers: CA-125 Results of the tumor marker test revealed 20.1    10/19/2018 Imaging    Ct scan of abdomen and pelvis Status post hysterectomy, bilateral salpingo-oophorectomy, and omentectomy.  Two peritoneal implants in the left upper abdomen are stable versus mildly decreased, as above. No abdominopelvic ascites.  No evidence of new/progressive metastatic disease.      11/19/2018 Tumor Marker    Patient's tumor was tested for the following markers: CA-125 Results of the tumor marker test revealed 21.7    11/23/2018 Echocardiogram    1. The left ventricle has normal systolic function of 10-62%. The cavity size was normal. There is no increased left ventricular wall thickness. Echo evidence of impaired diastolic relaxation.  2. The right ventricle has normal systolic function. The cavity was normal. There is no increase in right ventricular wall thickness.  3. The mitral valve is normal in structure. There is mild mitral annular calcification present.  4. The tricuspid valve is normal in  structure.  5. The aortic valve is tricuspid There is mild thickening of the aortic valve.  6. The pulmonic valve was normal in structure. Pulmonic valve regurgitation is mild by color flow Doppler.  7. Normal LV systolic function; mild diastolic dysfunction.     REVIEW OF SYSTEMS:   Constitutional: Denies fevers, chills or abnormal weight loss Eyes: Denies blurriness of vision Ears, nose, mouth, throat, and face: Denies mucositis or sore throat Respiratory: Denies cough, dyspnea or wheezes Cardiovascular: Denies palpitation, chest discomfort or lower extremity swelling Gastrointestinal:  Denies nausea, heartburn or change in bowel habits Skin: Denies abnormal skin rashes Lymphatics: Denies new lymphadenopathy or easy bruising Neurological:Denies numbness, tingling or new weaknesses Behavioral/Psych: Mood is stable, no new changes  All other systems were reviewed with the patient and are negative.  I have reviewed the past medical history, past surgical history, social history and family history with the patient and they are unchanged from previous note.  ALLERGIES:  has No Known Allergies.  MEDICATIONS:  Current Outpatient Medications  Medication Sig Dispense Refill  . ondansetron (ZOFRAN) 8 MG tablet Take 8 mg by mouth every 8 (eight)  hours as needed.    . prochlorperazine (COMPAZINE) 10 MG tablet Take 10 mg by mouth every 6 (six) hours as needed.    . Cholecalciferol (VITAMIN D) 2000 units CAPS Take 1 capsule by mouth daily.    Marland Kitchen ezetimibe (ZETIA) 10 MG tablet Take 10 mg by mouth daily.     No current facility-administered medications for this visit.    Facility-Administered Medications Ordered in Other Visits  Medication Dose Route Frequency Provider Last Rate Last Dose  . CARBOplatin (PARAPLATIN) 380 mg in sodium chloride 0.9 % 250 mL chemo infusion  380 mg Intravenous Once [name], MD 576 mL/hr at 12/31/18 1605 380 mg at 12/31/18 1605    PHYSICAL EXAMINATION: ECOG  PERFORMANCE STATUS: 1 - Symptomatic but completely ambulatory  Vitals:   12/31/18 1226  BP: (!) 115/53  Pulse: 69  Resp: 18  Temp: 98.1 F (36.7 C)  SpO2: 100%   Filed Weights   12/31/18 1226  Weight: 155 lb 9.6 oz (70.6 kg)    GENERAL:alert, no distress and comfortable SKIN: skin color, texture, turgor are normal, no rashes or significant lesions EYES: normal, Conjunctiva are pink and non-injected, sclera clear OROPHARYNX:no exudate, no erythema and lips, buccal mucosa, and tongue normal  NECK: supple, thyroid normal size, non-tender, without nodularity LYMPH:  no palpable lymphadenopathy in the cervical, axillary or inguinal LUNGS: clear to auscultation and percussion with normal breathing effort HEART: regular rate & rhythm and no murmurs and no lower extremity edema ABDOMEN:abdomen soft, non-tender and normal bowel sounds Musculoskeletal:no cyanosis of digits and no clubbing  NEURO: alert & oriented x 3 with fluent speech, no focal motor/sensory deficits  LABORATORY DATA:  I have reviewed the data as listed    Component Value Date/Time   NA 139 12/31/2018 1152   NA 139 06/07/2017 1430   K 4.3 12/31/2018 1152   K 3.8 06/07/2017 1430   CL 105 12/31/2018 1152   CO2 24 12/31/2018 1152   CO2 27 06/07/2017 1430   GLUCOSE 106 (H) 12/31/2018 1152   GLUCOSE 124 06/07/2017 1430   BUN 14 12/31/2018 1152   BUN 14.0 06/07/2017 1430   CREATININE 0.82 12/31/2018 1152   CREATININE 0.8 06/07/2017 1430   CALCIUM 8.9 12/31/2018 1152   CALCIUM 9.3 06/07/2017 1430   PROT 6.8 12/31/2018 1152   PROT 6.6 09/30/2016 1027   ALBUMIN 3.6 12/31/2018 1152   ALBUMIN 3.5 09/30/2016 1027   AST 18 12/31/2018 1152   AST 18 09/30/2016 1027   ALT 29 12/31/2018 1152   ALT 21 09/30/2016 1027   ALKPHOS 70 12/31/2018 1152   ALKPHOS 77 09/30/2016 1027   BILITOT 0.4 12/31/2018 1152   BILITOT 0.37 09/30/2016 1027   GFRNONAA >60 12/31/2018 1152   GFRAA >60 12/31/2018 1152    No results found  for: SPEP, UPEP  Lab Results  Component Value Date   WBC 3.1 (L) 12/31/2018   NEUTROABS 1.7 12/31/2018   HGB 11.4 (L) 12/31/2018   HCT 34.3 (L) 12/31/2018   MCV 100.6 (H) 12/31/2018   PLT 220 12/31/2018      Chemistry      Component Value Date/Time   NA 139 12/31/2018 1152   NA 139 06/07/2017 1430   K 4.3 12/31/2018 1152   K 3.8 06/07/2017 1430   CL 105 12/31/2018 1152   CO2 24 12/31/2018 1152   CO2 27 06/07/2017 1430   BUN 14 12/31/2018 1152   BUN 14.0 06/07/2017 1430   CREATININE 0.82 12/31/2018  1152   CREATININE 0.8 06/07/2017 1430      Component Value Date/Time   CALCIUM 8.9 12/31/2018 1152   CALCIUM 9.3 06/07/2017 1430   ALKPHOS 70 12/31/2018 1152   ALKPHOS 77 09/30/2016 1027   AST 18 12/31/2018 1152   AST 18 09/30/2016 1027   ALT 29 12/31/2018 1152   ALT 21 09/30/2016 1027   BILITOT 0.4 12/31/2018 1152   BILITOT 0.37 09/30/2016 1027      All questions were answered. The patient knows to call the clinic with any problems, questions or concerns. No barriers to learning was detected.  I spent 15 minutes counseling the patient face to face. The total time spent in the appointment was 20 minutes and more than 50% was on counseling and review of test results  [name], MD 12/31/2018 4:21 PM

## 2019-01-01 ENCOUNTER — Telehealth: Payer: Self-pay

## 2019-01-01 LAB — CA 125: Cancer Antigen (CA) 125: 17.1 U/mL (ref 0.0–38.1)

## 2019-01-01 NOTE — Telephone Encounter (Signed)
Called and given CA 125 results

## 2019-01-01 NOTE — Telephone Encounter (Signed)
-----   Message from [name], MD sent at 01/01/2019  7:55 AM EDT ----- Regarding: CA-125 is better Pls call and let her know

## 2019-01-24 ENCOUNTER — Telehealth: Payer: Self-pay

## 2019-01-24 ENCOUNTER — Other Ambulatory Visit: Payer: Self-pay | Admitting: Hematology and Oncology

## 2019-01-24 ENCOUNTER — Telehealth: Payer: Self-pay | Admitting: Oncology

## 2019-01-24 DIAGNOSIS — C5700 Malignant neoplasm of unspecified fallopian tube: Secondary | ICD-10-CM

## 2019-01-24 DIAGNOSIS — C786 Secondary malignant neoplasm of retroperitoneum and peritoneum: Secondary | ICD-10-CM

## 2019-01-24 DIAGNOSIS — C5702 Malignant neoplasm of left fallopian tube: Secondary | ICD-10-CM

## 2019-01-24 NOTE — Telephone Encounter (Signed)
Called [place] back with message form Dr. Alvy Bimler.  She said she would prefer to stop and have the scan now.

## 2019-01-24 NOTE — Telephone Encounter (Signed)
Shiryl called and asked if Dr. Alvy Bimler would recommend waiting 1-2 weeks for her appointments/infusion due to Covid-19.  She said she is not nervous about going to the appointments but just wanted to know what Dr. Alvy Bimler recommends.Marland Kitchen

## 2019-01-24 NOTE — Telephone Encounter (Signed)
The decision is up to her I was planning to proceed with treatment next week and then order CT and then plan to stop chemo if CT looks good. Another option would be to stop and scan now and see what the scan shows

## 2019-01-24 NOTE — Telephone Encounter (Signed)
I will cancel her appt next Monday and order the scan Will make follow-up appt once I know when CT is scheduled

## 2019-01-24 NOTE — Telephone Encounter (Signed)
Called pt and gave appt dates, times for 4/16 and 4/17 lab, CT scan and see Dr Alvy Bimler

## 2019-01-28 ENCOUNTER — Ambulatory Visit: Payer: Medicare Other | Admitting: Hematology and Oncology

## 2019-01-28 ENCOUNTER — Ambulatory Visit: Payer: Medicare Other

## 2019-01-28 ENCOUNTER — Other Ambulatory Visit: Payer: Medicare Other

## 2019-01-31 ENCOUNTER — Other Ambulatory Visit: Payer: Self-pay

## 2019-01-31 ENCOUNTER — Inpatient Hospital Stay: Payer: Medicare Other | Attending: Gynecologic Oncology

## 2019-01-31 ENCOUNTER — Other Ambulatory Visit: Payer: Medicare Other

## 2019-01-31 ENCOUNTER — Ambulatory Visit (HOSPITAL_COMMUNITY)
Admission: RE | Admit: 2019-01-31 | Discharge: 2019-01-31 | Disposition: A | Discharge status: Home / Self Care | Payer: Medicare Other | Source: Ambulatory Visit | Attending: Hematology and Oncology | Admitting: Hematology and Oncology

## 2019-01-31 DIAGNOSIS — C5700 Malignant neoplasm of unspecified fallopian tube: Secondary | ICD-10-CM | POA: Diagnosis not present

## 2019-01-31 DIAGNOSIS — C786 Secondary malignant neoplasm of retroperitoneum and peritoneum: Secondary | ICD-10-CM | POA: Diagnosis not present

## 2019-01-31 DIAGNOSIS — D61818 Other pancytopenia: Secondary | ICD-10-CM | POA: Diagnosis not present

## 2019-01-31 DIAGNOSIS — C5702 Malignant neoplasm of left fallopian tube: Secondary | ICD-10-CM | POA: Insufficient documentation

## 2019-01-31 DIAGNOSIS — K439 Ventral hernia without obstruction or gangrene: Secondary | ICD-10-CM | POA: Diagnosis not present

## 2019-01-31 LAB — CBC WITH DIFFERENTIAL/PLATELET
Abs Immature Granulocytes: 0.01 10*3/uL (ref 0.00–0.07)
Basophils Absolute: 0 10*3/uL (ref 0.0–0.1)
Basophils Relative: 0 %
Eosinophils Absolute: 0 10*3/uL (ref 0.0–0.5)
Eosinophils Relative: 1 %
HCT: 34.5 % — ABNORMAL LOW (ref 36.0–46.0)
Hemoglobin: 11.4 g/dL — ABNORMAL LOW (ref 12.0–15.0)
Immature Granulocytes: 0 %
Lymphocytes Relative: 28 %
Lymphs Abs: 0.8 10*3/uL (ref 0.7–4.0)
MCH: 33 pg (ref 26.0–34.0)
MCHC: 33 g/dL (ref 30.0–36.0)
MCV: 100 fL (ref 80.0–100.0)
Monocytes Absolute: 0.5 10*3/uL (ref 0.1–1.0)
Monocytes Relative: 17 %
Neutro Abs: 1.5 10*3/uL — ABNORMAL LOW (ref 1.7–7.7)
Neutrophils Relative %: 54 %
Platelets: 174 10*3/uL (ref 150–400)
RBC: 3.45 MIL/uL — ABNORMAL LOW (ref 3.87–5.11)
RDW: 12.8 % (ref 11.5–15.5)
WBC: 2.8 10*3/uL — ABNORMAL LOW (ref 4.0–10.5)
nRBC: 0 % (ref 0.0–0.2)

## 2019-01-31 LAB — COMPREHENSIVE METABOLIC PANEL
ALT: 26 U/L (ref 0–44)
AST: 18 U/L (ref 15–41)
Albumin: 3.6 g/dL (ref 3.5–5.0)
Alkaline Phosphatase: 66 U/L (ref 38–126)
Anion gap: 9 (ref 5–15)
BUN: 12 mg/dL (ref 8–23)
CO2: 26 mmol/L (ref 22–32)
Calcium: 9.1 mg/dL (ref 8.9–10.3)
Chloride: 105 mmol/L (ref 98–111)
Creatinine, Ser: 0.78 mg/dL (ref 0.44–1.00)
GFR calc Af Amer: >60 mL/min (ref >60)
GFR calc non Af Amer: >60 mL/min (ref >60)
Glucose, Bld: 85 mg/dL (ref 70–99)
Potassium: 4.3 mmol/L (ref 3.5–5.1)
Sodium: 140 mmol/L (ref 135–145)
Total Bilirubin: 0.3 mg/dL (ref 0.3–1.2)
Total Protein: 6.6 g/dL (ref 6.5–8.1)

## 2019-01-31 MED ORDER — SODIUM CHLORIDE (PF) 0.9 % IJ SOLN
INTRAMUSCULAR | Status: AC
  Filled 2019-01-31: qty 50

## 2019-01-31 MED ORDER — IOHEXOL 300 MG/ML  SOLN
100.0000 mL | Freq: Once | INTRAMUSCULAR | Status: AC | PRN
  Administered 2019-01-31: 10:00:00 100 mL via INTRAVENOUS

## 2019-02-01 ENCOUNTER — Inpatient Hospital Stay (HOSPITAL_BASED_OUTPATIENT_CLINIC_OR_DEPARTMENT_OTHER): Payer: Medicare Other | Admitting: Hematology and Oncology

## 2019-02-01 ENCOUNTER — Encounter: Payer: Self-pay | Admitting: Hematology and Oncology

## 2019-02-01 ENCOUNTER — Other Ambulatory Visit: Payer: Self-pay

## 2019-02-01 DIAGNOSIS — D61818 Other pancytopenia: Secondary | ICD-10-CM

## 2019-02-01 DIAGNOSIS — C5700 Malignant neoplasm of unspecified fallopian tube: Secondary | ICD-10-CM

## 2019-02-01 DIAGNOSIS — Z7189 Other specified counseling: Secondary | ICD-10-CM | POA: Diagnosis not present

## 2019-02-01 LAB — CA 125: Cancer Antigen (CA) 125: 18.9 U/mL (ref 0.0–38.1)

## 2019-02-01 NOTE — Progress Notes (Signed)
[facility] OFFICE PROGRESS NOTE  Patient Care Team: [name], MD as PCP - General (Internal Medicine)  ASSESSMENT & PLAN:  Fallopian tube cancer, carcinoma ([place]) I have reviewed the blood work and imaging studies compared with previous scans She tolerated recent treatment well without major side effects In my opinion, she has achieved maximum response to treatment.  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   INTERVAL HISTORY: Please see below for problem oriented charting. 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 Extensive omental nodularity highly worrisome for peritoneal carcinomatosis. Late recurrence of melanoma can present as peritoneal carcinomatosis. Ovarian cancer more commonly presents in this manner. Patient does have a predominately low density right adnexal lesion measuring up to 3.5 cm, although this lesion is not typical for ovarian cancer. 2. No evidence of bowel or ureteral obstruction. 3. No other definite signs of metastatic disease. There are small indeterminate low-density hepatic lesions.    02/08/2016 Tumor Marker    Patient's tumor was tested for the following markers: CA-125 Results of the tumor marker test revealed 656.2    02/15/2016 Procedure    CT-guided core biopsy performed of omental mass just deep to the abdominal wall.    02/15/2016 Pathology Results    Omentum, biopsy, left - PAPILLARY SEROUS NEOPLASM, SEE COMMENT. Microscopic Comment There are papillary collections of largely low grade appearing cells with numerous psammoma bodies. Given the limited material it is difficult to distinguish between invasive implants of a serous borderline tumor and serous carcinoma, especially given the low grade appearance.    02/18/2016 Pathology Results    1. Omentum, resection for tumor INVASIVE IMPLANT OF HIGH GRADE SEROUS CARCINOMA 2. Uterus +/- tubes/ovaries, neoplastic HIGH GRADE SEROUS CARCINOMA INVOLVING LEFT TUBAL FIMBRIA SEROUS CARCINOMA WITH PREDOMINANT PSAMMOMA BODIES IMPLANT AT UTERUS SEROSA, BILATERAL FALLOPIAN TUBAL SEROSA, AND ANTERIOR PERITONEAL REFLECTION CERVIX: HISTOLOGICAL UNREMARKABLE ENDOMETRIUM: INACTIVE ENDOMETRIUM MYOMETRIUM: LEIOMYOMA LEFT OVARY: CYSTADENOFIBROMA RIGHT OVARY AND FALLOPIAN TUBE: HISTOLOGICAL UNREMARKABLE Microscopic Comment 2. ONCOLOGY TABLE - FALLOPIAN TUBE 1. Specimen, including laterality: Omentum, uterus, bilateral ovaries and fallopian tubes 2. Procedure: Hysterectomy, bilateral salpingo-oophorectomy  and tumor debulking  omentectomy 3. Lymph node sampling performed: No 4. Tumor site: uterus serosa, bilateral fallopian tubal serosa, peritoneal and omentum 5. Tumor location in fallopian tube: Left fallopian tubal fimbria 6. Specimen integrity (intact/ruptured/disrupted): Intact 7. Tumor size (cm): multi focal invasive omentum implant greater than 2 cm, left fallopian tube tumor 0.8 cm. 8. Histologic type: Serous carcinoma 9. Grade: 3 10. Microscopic tumor extension: uterus serosa, bilateral fallopian tubal serosa, peritoneal and omentum 11. Margins: NA 12. Lymph-Vascular invasion: identified 13. Lymph nodes: # examined: 0; # positive: NA 14. TNM: pT3c, pNx 15. FIGO Stage (based on pathologic findings, needs clinical correlation: IIIC 16. Comment: High grade serous carcinoma multifocally and extensively involves left fallopian tubal fimbria, omentum, uterus serosa, bilateral fallopian tubal serosa and peritoneal. At the left fallopian tube there are small foci of serous tubal intraepithelial carcinoma identified, so we conclude the carcinoma is fallopian tube origin    03/09/2016 Tumor Marker    Patient's tumor was tested for the following markers: CA-125 Results of the tumor marker test revealed 154.4    03/15/2016 Procedure    Technically successful right IJ power-injectable port catheter placement. Ready for routine use.    03/18/2016 - 07/13/2016 Chemotherapy    The patient had 6 cycles of carboplatin and taxol    04/14/2016 Tumor Marker    Patient's tumor was tested for the following markers: CA-125 Results of the tumor marker test revealed 36.7    04/25/2016 Genetic Testing    Patient has genetic testing done for germline mutation Results revealed patient has no mutation    04/28/2016 Tumor Marker    Patient's tumor was tested for the following markers: CA-125 Results of the tumor marker test revealed 24.6    06/21/2016 Tumor Marker    Patient's tumor was tested for the following markers:  CA-125 Results of the tumor marker test revealed 16.6    08/01/2016 Tumor Marker    Patient's tumor was tested for the following markers: CA-125 Results of the tumor marker test revealed 17.2    08/05/2016 Imaging    Interval TAH-BSO. No evidence of residual pelvic mass or metastatic disease within the abdomen or pelvis. No other acute findings.     11/04/2016 Tumor Marker    Patient's tumor was tested for the following markers: CA-125 Results of the tumor marker test revealed 13.6    01/20/2017 Tumor Marker    Patient's tumor was tested for the following markers: CA-125 Results of the tumor marker test revealed 13.8    04/14/2017 Tumor Marker    Patient's tumor was tested for the following markers: CA-125 Results of the tumor marker test revealed 16.1    06/13/2017 Imaging    No acute findings.  No mass or hernia identified.  Colonic diverticulosis, without radiographic evidence of diverticulitis.  Aortic atherosclerosis.    07/03/2017 Tumor Marker    Patient's tumor was tested for the following markers: CA-125 Results of the tumor marker test revealed 18.4    09/19/2017 Tumor Marker    Patient's tumor was tested for the following markers: CA-125 Results of the tumor marker test revealed 18.5    01/23/2018 Tumor Marker    Patient's tumor was tested for the following markers: CA-125 Results of the tumor marker test revealed 35.4    01/30/2018 Imaging    1. No evidence of local fallopian tube carcinoma recurrence within the pelvis. Post hysterectomy. 2. No evidence of metastatic peritoneal disease or omental disease. No solid organ metastasis.  03/20/2018 Tumor Marker    Patient's tumor was tested for the following markers: CA-125 Results of the tumor marker test revealed 73    05/02/2018 Tumor Marker    Patient's tumor was tested for the following markers: CA-125 Results of the tumor marker test revealed 127.1    05/08/2018 Imaging    CT abdomen and pelvis 1. 7 cm  left subdiaphragmatic collection of loculated fluid versus low-density soft tissue. Given the patient's history of rising CA 125, metastatic disease considered highly likely.  2. Areas of fluid or recurrent disease identified in the right lower quadrant adjacent to the cecum and along right lower quadrant small bowel loops.    05/14/2018 Tumor Marker    Patient's tumor was tested for the following markers: CA-125 Results of the tumor marker test revealed 166     Genetic Testing    Patient has genetic testing done for ER/PR and MMR. Results revealed patient has the following on 02/18/2016 surgical pathology: ER 20%, PR 0% MMR: normal     Genetic Testing    Patient has genetic testing done for MSI. Results revealed patient has the following: MSI stable    05/18/2018 -  Chemotherapy    The patient had carboplatin and Doxil    07/16/2018 Tumor Marker    Patient's tumor was tested for the following markers: CA-125 Results of the tumor marker test revealed 28.7    08/10/2018 Imaging    CT imaging:  Decreased size of peritoneal soft tissue masses in left upper quadrant, consistent with decreased metastatic disease.  No new or progressive metastatic disease. No other acute findings.  Colonic diverticulosis, without radiographic evidence of diverticulitis.  Stable small paraumbilical ventral hernia containing transverse colon.    08/13/2018 Tumor Marker    Patient's tumor was tested for the following markers: CA-125 Results of the tumor marker test revealed 21.6    08/24/2018 Echocardiogram    LV EF: 60% -  65%    10/15/2018 Tumor Marker    Patient's tumor was tested for the following markers: CA-125 Results of the tumor marker test revealed 20.1    10/19/2018 Imaging    Ct scan of abdomen and pelvis Status post hysterectomy, bilateral salpingo-oophorectomy, and omentectomy.  Two peritoneal implants in the left upper abdomen are stable versus mildly decreased, as above. No  abdominopelvic ascites.  No evidence of new/progressive metastatic disease.      11/19/2018 Tumor Marker    Patient's tumor was tested for the following markers: CA-125 Results of the tumor marker test revealed 21.7    11/23/2018 Echocardiogram    1. The left ventricle has normal systolic function of 44-31%. The cavity size was normal. There is no increased left ventricular wall thickness. Echo evidence of impaired diastolic relaxation.  2. The right ventricle has normal systolic function. The cavity was normal. There is no increase in right ventricular wall thickness.  3. The mitral valve is normal in structure. There is mild mitral annular calcification present.  4. The tricuspid valve is normal in structure.  5. The aortic valve is tricuspid There is mild thickening of the aortic valve.  6. The pulmonic valve was normal in structure. Pulmonic valve regurgitation is mild by color flow Doppler.  7. Normal LV systolic function; mild diastolic dysfunction.    12/31/2018 Tumor Marker    Patient's tumor was tested for the following markers: CA-125 Results of the tumor marker test revealed 17.1    01/31/2019 Imaging    1. Left upper  quadrant peritoneal implants described previously have decreased in the interval. No new or progressive findings in the abdomen/pelvis today. No free fluid. 2. Right paraumbilical ventral hernia contains a small knuckle of transverse colon without complicating features. 3.  Aortic Atherosclerois (ICD10-170.0)  Aortic Atherosclerosis (ICD10-I70.0).    01/31/2019 Tumor Marker    Patient's tumor was tested for the following markers: CA-125 Results of the tumor marker test revealed 18.9     REVIEW OF SYSTEMS:   Constitutional: Denies fevers, chills or abnormal weight loss Eyes: Denies blurriness of vision Ears, nose, mouth, throat, and face: Denies mucositis or sore throat Respiratory: Denies cough, dyspnea or wheezes Cardiovascular: Denies palpitation,  chest discomfort or lower extremity swelling Gastrointestinal:  Denies nausea, heartburn or change in bowel habits Skin: Denies abnormal skin rashes Lymphatics: Denies new lymphadenopathy or easy bruising Neurological:Denies numbness, tingling or new weaknesses Behavioral/Psych: Mood is stable, no new changes  All other systems were reviewed with the patient and are negative.  I have reviewed the past medical history, past surgical history, social history and family history with the patient and they are unchanged from previous note.  ALLERGIES:  has No Known Allergies.  MEDICATIONS:  Current Outpatient Medications  Medication Sig Dispense Refill  . Cholecalciferol (VITAMIN D) 2000 units CAPS Take 1 capsule by mouth daily.    Marland Kitchen ezetimibe (ZETIA) 10 MG tablet Take 10 mg by mouth daily.    . ondansetron (ZOFRAN) 8 MG tablet Take 8 mg by mouth every 8 (eight) hours as needed.    . prochlorperazine (COMPAZINE) 10 MG tablet Take 10 mg by mouth every 6 (six) hours as needed.     No current facility-administered medications for this visit.     PHYSICAL EXAMINATION: ECOG PERFORMANCE STATUS: 1 - Symptomatic but completely ambulatory  Vitals:   02/01/19 0938  BP: 115/76  Pulse: 70  Resp: 18  Temp: 98.2 F (36.8 C)  SpO2: 100%   Filed Weights   02/01/19 0938  Weight: 154 lb 3.2 oz (69.9 kg)    GENERAL:alert, no distress and comfortable Musculoskeletal:no cyanosis of digits and no clubbing  NEURO: alert & oriented x 3 with fluent speech, no focal motor/sensory deficits  LABORATORY DATA:  I have reviewed the data as listed    Component Value Date/Time   NA 140 01/31/2019 0856   NA 139 06/07/2017 1430   K 4.3 01/31/2019 0856   K 3.8 06/07/2017 1430   CL 105 01/31/2019 0856   CO2 26 01/31/2019 0856   CO2 27 06/07/2017 1430   GLUCOSE 85 01/31/2019 0856   GLUCOSE 124 06/07/2017 1430   BUN 12 01/31/2019 0856   BUN 14.0 06/07/2017 1430   CREATININE 0.78 01/31/2019 0856    CREATININE 0.8 06/07/2017 1430   CALCIUM 9.1 01/31/2019 0856   CALCIUM 9.3 06/07/2017 1430   PROT 6.6 01/31/2019 0856   PROT 6.6 09/30/2016 1027   ALBUMIN 3.6 01/31/2019 0856   ALBUMIN 3.5 09/30/2016 1027   AST 18 01/31/2019 0856   AST 18 09/30/2016 1027   ALT 26 01/31/2019 0856   ALT 21 09/30/2016 1027   ALKPHOS 66 01/31/2019 0856   ALKPHOS 77 09/30/2016 1027   BILITOT 0.3 01/31/2019 0856   BILITOT 0.37 09/30/2016 1027   GFRNONAA >60 01/31/2019 0856   GFRAA >60 01/31/2019 0856    No results found for: SPEP, UPEP  Lab Results  Component Value Date   WBC 2.8 (L) 01/31/2019   NEUTROABS 1.5 (L) 01/31/2019   HGB 11.4 (  L) 01/31/2019   HCT 34.5 (L) 01/31/2019   MCV 100.0 01/31/2019   PLT 174 01/31/2019      Chemistry      Component Value Date/Time   NA 140 01/31/2019 0856   NA 139 06/07/2017 1430   K 4.3 01/31/2019 0856   K 3.8 06/07/2017 1430   CL 105 01/31/2019 0856   CO2 26 01/31/2019 0856   CO2 27 06/07/2017 1430   BUN 12 01/31/2019 0856   BUN 14.0 06/07/2017 1430   CREATININE 0.78 01/31/2019 0856   CREATININE 0.8 06/07/2017 1430      Component Value Date/Time   CALCIUM 9.1 01/31/2019 0856   CALCIUM 9.3 06/07/2017 1430   ALKPHOS 66 01/31/2019 0856   ALKPHOS 77 09/30/2016 1027   AST 18 01/31/2019 0856   AST 18 09/30/2016 1027   ALT 26 01/31/2019 0856   ALT 21 09/30/2016 1027   BILITOT 0.3 01/31/2019 0856   BILITOT 0.37 09/30/2016 1027       RADIOGRAPHIC STUDIES: I have reviewed multiple imaging studies with the patient I have personally reviewed the radiological images as listed and agreed with the findings in the report. Ct Abdomen Pelvis W Contrast  Result Date: 01/31/2019 CLINICAL DATA:  Fallopian tube cancer. EXAM: CT ABDOMEN AND PELVIS WITH CONTRAST TECHNIQUE: Multidetector CT imaging of the abdomen and pelvis was performed using the standard protocol following bolus administration of intravenous contrast. CONTRAST:  151m OMNIPAQUE IOHEXOL 300 MG/ML   SOLN COMPARISON:  10/22/2018 FINDINGS: Lower chest: Unremarkable. Hepatobiliary: No suspicious focal abnormality within the liver parenchyma. There is no evidence for gallstones, gallbladder wall thickening, or pericholecystic fluid. No intrahepatic or extrahepatic biliary dilation. Pancreas: No focal mass lesion. No dilatation of the main duct. No intraparenchymal cyst. No peripancreatic edema. Spleen: No splenomegaly. No focal mass lesion. Adrenals/Urinary Tract: No adrenal nodule or mass. Central sinus cysts noted bilaterally. No evidence for hydroureter. The urinary bladder appears normal for the degree of distention. Stomach/Bowel: Stomach is unremarkable. No gastric wall thickening. No evidence of outlet obstruction. Duodenum is normally positioned as is the ligament of Treitz. No small bowel wall thickening. No small bowel dilatation. The terminal ileum is normal. The appendix is normal. No gross colonic mass. No colonic wall thickening. Diverticular changes are noted in the left colon without evidence of diverticulitis. Vascular/Lymphatic: There is abdominal aortic atherosclerosis without aneurysm. There is no gastrohepatic or hepatoduodenal ligament lymphadenopathy. No intraperitoneal or retroperitoneal lymphadenopathy. No pelvic sidewall lymphadenopathy. Reproductive: The uterus is surgically absent. There is no adnexal mass. Other: No intraperitoneal free fluid. Peritoneal implants in the left upper quadrant are decreased in the interval. Index more cranial lesion measures 2.5 x 3.1 cm today (13/2) compared to 3.0 x 3.7 cm previously. More inferior and smaller nodule measures 1.4 x 1.2 cm today compared to 2.3 x 1.8 cm previously. Musculoskeletal: No worrisome lytic or sclerotic osseous abnormality. Right paraumbilical ventral hernia contains short segment of transverse colon without complicating features. IMPRESSION: 1. Left upper quadrant peritoneal implants described previously have decreased in the  interval. No new or progressive findings in the abdomen/pelvis today. No free fluid. 2. Right paraumbilical ventral hernia contains a small knuckle of transverse colon without complicating features. 3.  Aortic Atherosclerois (ICD10-170.0) Aortic Atherosclerosis (ICD10-I70.0). Electronically Signed   By: EMisty StanleyM.D.   On: 01/31/2019 15:01    All questions were answered. The patient knows to call the clinic with any problems, questions or concerns. No barriers to learning was detected.  I  spent 30 minutes counseling the patient face to face. The total time spent in the appointment was 40 minutes and more than 50% was on counseling and review of test results  [name], MD 02/01/2019 2:03 PM

## 2019-02-01 NOTE — Assessment & Plan Note (Addendum)
We have extensive discussions about goals of care She understood goals of care is palliative in nature

## 2019-02-01 NOTE — Assessment & Plan Note (Signed)
She is currently asymptomatic We discussed neutropenic precaution

## 2019-02-01 NOTE — Assessment & Plan Note (Signed)
I have reviewed the blood work and imaging studies compared with previous scans She tolerated recent treatment well without major side effects In my opinion, she has achieved maximum response to treatment.  It is not clear whether she will continue to benefit from future treatment We balance out the risk, benefits, side effects of continuing treatment versus observation versus switching her to maintenance treatment with either bevacizumab or niraparib I reviewed with her each options in great detail The patient is undecided Currently, she is not symptomatic I will discuss her situation with her GYN oncologist next week I will call her with further plan of care I have addressed all her questions and concerns

## 2019-02-04 ENCOUNTER — Other Ambulatory Visit: Payer: Self-pay | Admitting: Hematology and Oncology

## 2019-02-04 ENCOUNTER — Telehealth: Payer: Self-pay | Admitting: Pharmacist

## 2019-02-04 ENCOUNTER — Telehealth: Payer: Self-pay

## 2019-02-04 ENCOUNTER — Telehealth: Payer: Self-pay | Admitting: Hematology and Oncology

## 2019-02-04 DIAGNOSIS — C5700 Malignant neoplasm of unspecified fallopian tube: Secondary | ICD-10-CM

## 2019-02-04 DIAGNOSIS — C5702 Malignant neoplasm of left fallopian tube: Secondary | ICD-10-CM

## 2019-02-04 MED ORDER — NIRAPARIB TOSYLATE 100 MG PO CAPS: 200.0000 mg | ORAL_CAPSULE | Freq: Every day | ORAL | 11 refills | Status: DC

## 2019-02-04 MED ORDER — PROCHLORPERAZINE MALEATE 10 MG PO TABS: 10.0000 mg | ORAL_TABLET | Freq: Four times a day (QID) | ORAL | 1 refills | Status: DC | PRN

## 2019-02-04 NOTE — Telephone Encounter (Signed)
I have reviewed the recommendation from GYN oncologist with the patient The patient is undecided She is interested to proceed with what ever that we recommend We discussed the risk of benefits of bevacizumab versus Niraparib Ultimately, we will get her started on Niraparib I will get insurance prior authorization and once we can determine when she will start her treatment, I will bring her back for toxicity review Given chronic pancytopenia, I will start her on reduced dose

## 2019-02-04 NOTE — Telephone Encounter (Signed)
Oral Chemotherapy Pharmacist Encounter   I spoke with patient today for overview of: Zejula (niraparib) for the maintenance treatment of recurrent fallopian tube cancer with response to platinum based chemotherapy, planned duration until disease progression or unacceptable toxicity.   Counseled patient on administration, dosing, side effects, monitoring, drug-food interactions, safe handling, storage, and disposal.  Patient will take Zejula 100mg  capsules, 2 capsules (200mg ) by mouth once daily, with a glass of water, without regard to food. Patient will likely take her Zejula at night before bed.  Zejula start date: 02/11/2019   Adverse effects include but are not limited to: nausea, vomiting, diarrhea, taste changes, mouth sores, fatigue, constipation, decreased blood counts, and joint pain. Myelodysplastic syndrome/acute myeloid leukemia (MDS/AML) have been reported (rarely) in clinical trials.  Patient updated about close blood count monitoring at the initiation of Zejula for detection of thrombocytopenia.  Patient has anti-emetic on hand and knows to take it if nausea develops.  New prescription for prochlorperazine sent to [facility].   Reviewed with patient importance of keeping a medication schedule and plan for any missed doses.  Jessica Johnston voiced understanding and appreciation.   Patient with questions about referral to dietician at [facility]. I will alert MD.  All questions answered. Medication reconciliation performed and medication/allergy list updated.  Insurance authorization for Jessica Johnston has been obtained. Test claim at the pharmacy revealed copayment [phone] for 1st fill of Zejula. Income is too high for foundation copayment grant or manufacturer compassionate use program. Copayment is affordable to patient.  This will ship from the [facility] on 02/05/19 to deliver to patient's home on 02/06/19. Patient plans to start Zejula on Monday 4/27.  Patient  informed the pharmacy will reach out 5-7 days prior to needing next fill of Zejula to coordinate continued medication acquisition to prevent break in therapy.  Patient knows to call the office with questions or concerns. Oral Oncology Clinic will continue to follow.  Johny Drilling, PharmD, BCPS, BCOP  02/04/2019   2:23 PM Oral Oncology Clinic [phone]

## 2019-02-04 NOTE — Telephone Encounter (Signed)
Oral Oncology Pharmacist Encounter  Received notification from [employer] that prior authorization for Zejula has been approved.  Ref# [id number] Effective dates: 02/04/19-10/16/30 Authorization team ph: [place], PharmD, BCPS, BCOP  02/04/2019 11:56 AM Oral Oncology Clinic [phone]

## 2019-02-04 NOTE — Telephone Encounter (Signed)
Oral Oncology Patient Advocate Encounter  Received notification from [employer] that prior authorization for Zejula is required.  PA submitted on CoverMyMeds Key [id number] Status is pending  Oral Oncology Clinic will continue to follow.  [facility] Patient [place] Phone [phone] Fax [phone] 02/04/2019    10:37 AM

## 2019-02-04 NOTE — Telephone Encounter (Signed)
Oral Oncology Pharmacist Encounter  Received new prescription for Zejula (niraparib) for the maintenance treatment of recurrent fallopian tube cancer with response to platinum based chemotherapy, planned duration until disease progression or unacceptable toxicity.  Original diagnosis in May 2017 with stage III disease Patient received carboplatin and paclitaxel x 6 cycles June - Sept 2017 Relapse was noted in July 2019 Patient received carboplatin and liposomal doxorubicin x 7 cycles Aug 2019 - March 2020 Patient is now under evaluation to initiate maintenance treatment with Zejula   Labs from 01/31/2019 assessed, OK for treatment. SCr=0.78, round to 1.0 for age > 13, est CrCl ~ 50 mL/min Wt = 69.9kg Pltc = 174k  Zejula will be initiated at 200mg  once daily with wt<77kg Dose may be increased to 300mg  once daily in 2-3 months pending toleration  Current medication list in Epic reviewed, no DDIs with Zejula identified.  Prescription has been e-scribed to the [facility] for benefits analysis and approval by MD.  Oral Oncology Clinic will continue to follow for insurance authorization, copayment issues, initial counseling and start date.  Johny Drilling, PharmD, BCPS, BCOP  02/04/2019 9:16 AM Oral Oncology Clinic [phone]

## 2019-02-05 MED FILL — ZEJULA 100 MG CAPS: 100 | 30 days supply | Qty: 60 | Fill #0

## 2019-02-05 MED FILL — PROCHLORPERAZINE 10 MG TAB: 10 | 7 days supply | Qty: 30 | Fill #0

## 2019-02-07 ENCOUNTER — Telehealth: Payer: Self-pay | Admitting: Oncology

## 2019-02-07 ENCOUNTER — Encounter

## 2019-02-07 MED ORDER — VENLAFAXINE SR 75 MG 24 HR CAP
75 mg | ORAL_CAPSULE | ORAL | 3 refills | Status: DC
Start: 2019-02-07 — End: 2019-11-22

## 2019-02-07 NOTE — Telephone Encounter (Signed)
Jessica Johnston called and said she will be starting Zejula on Monday. She was wondering if she has to take compazine because she was reading the side effects and said they look worse than Zejula.  Advised her that she can take it as needed if she gets nausea.  She verbalized agreement.

## 2019-02-07 NOTE — Telephone Encounter (Signed)
Oral Oncology Patient Advocate Encounter  Confirmed with [place] that Jessica Johnston was shipped on 02/05/19 with a $2949.34 copay. Does not qualify for assistance.   [place] [facility] [place] Phone [phone] Fax [phone] 02/07/2019   1:10 PM

## 2019-02-11 NOTE — Telephone Encounter (Signed)
Oral Oncology Pharmacist Encounter  Received call from patient with few additional questions about Zejula initiation.  Patient wondering if she is able to handle Zejula with her hands. Patient informed that she can handle Zejula capsules with her hands, she just needs to thoroughly wash her hands after handling the medication and taking her dose for the day. She can also get paper medication administration cups if she would like to pour the capsules into the cup and use the cup to put the capsules in her mouth.  We discussed that patient could should close the lid to flush the toilet after urination or defecation. She should flush the toilet 2 times if she has a high efficiency, low water toilet bowl. She is able to launder her clothes with the clothes of other household members. She should launder her clothes separately if they become soiled with excrement. She does not need to be concerned about nasal discharge or saliva. We discussed percentage of active drug in her urine and feces and how Zejula elimination differs from that of previously experienced carboplatin, liposomal-doxorubicin, and paclitaxel.  All questions answered. Patient is agreeable to initiating Zejula 200 mg once daily, she will start this evening. Confirmed office visits on 02/18/2019 with patient.  She expressed understanding and appreciation. She knows to call the office with any additional questions or concerns.  Johny Drilling, PharmD, BCPS, BCOP  02/11/2019 3:57 PM Oral Oncology Clinic [phone]

## 2019-02-18 ENCOUNTER — Inpatient Hospital Stay: Payer: Medicare Other | Attending: Gynecologic Oncology | Admitting: Hematology and Oncology

## 2019-02-18 ENCOUNTER — Encounter: Payer: Self-pay | Admitting: Hematology and Oncology

## 2019-02-18 ENCOUNTER — Inpatient Hospital Stay: Payer: Medicare Other

## 2019-02-18 ENCOUNTER — Other Ambulatory Visit: Payer: Self-pay

## 2019-02-18 DIAGNOSIS — D61818 Other pancytopenia: Secondary | ICD-10-CM | POA: Diagnosis not present

## 2019-02-18 DIAGNOSIS — R030 Elevated blood-pressure reading, without diagnosis of hypertension: Secondary | ICD-10-CM | POA: Diagnosis not present

## 2019-02-18 DIAGNOSIS — C5702 Malignant neoplasm of left fallopian tube: Secondary | ICD-10-CM | POA: Insufficient documentation

## 2019-02-18 DIAGNOSIS — C5700 Malignant neoplasm of unspecified fallopian tube: Secondary | ICD-10-CM

## 2019-02-18 LAB — CBC WITH DIFFERENTIAL/PLATELET
Abs Immature Granulocytes: 0 10*3/uL (ref 0.00–0.07)
Basophils Absolute: 0 10*3/uL (ref 0.0–0.1)
Basophils Relative: 1 %
Eosinophils Absolute: 0.1 10*3/uL (ref 0.0–0.5)
Eosinophils Relative: 1 %
HCT: 36.8 % (ref 36.0–46.0)
Hemoglobin: 12.3 g/dL (ref 12.0–15.0)
Immature Granulocytes: 0 %
Lymphocytes Relative: 25 %
Lymphs Abs: 1.1 10*3/uL (ref 0.7–4.0)
MCH: 33.5 pg (ref 26.0–34.0)
MCHC: 33.4 g/dL (ref 30.0–36.0)
MCV: 100.3 fL — ABNORMAL HIGH (ref 80.0–100.0)
Monocytes Absolute: 0.6 10*3/uL (ref 0.1–1.0)
Monocytes Relative: 13 %
Neutro Abs: 2.6 10*3/uL (ref 1.7–7.7)
Neutrophils Relative %: 60 %
Platelets: 219 10*3/uL (ref 150–400)
RBC: 3.67 MIL/uL — ABNORMAL LOW (ref 3.87–5.11)
RDW: 12.3 % (ref 11.5–15.5)
WBC: 4.3 10*3/uL (ref 4.0–10.5)
nRBC: 0 % (ref 0.0–0.2)

## 2019-02-18 LAB — COMPREHENSIVE METABOLIC PANEL
ALT: 26 U/L (ref 0–44)
AST: 22 U/L (ref 15–41)
Albumin: 4 g/dL (ref 3.5–5.0)
Alkaline Phosphatase: 83 U/L (ref 38–126)
Anion gap: 10 (ref 5–15)
BUN: 12 mg/dL (ref 8–23)
CO2: 26 mmol/L (ref 22–32)
Calcium: 9.5 mg/dL (ref 8.9–10.3)
Chloride: 103 mmol/L (ref 98–111)
Creatinine, Ser: 1.09 mg/dL — ABNORMAL HIGH (ref 0.44–1.00)
GFR calc Af Amer: 57 mL/min — ABNORMAL LOW (ref >60)
GFR calc non Af Amer: 49 mL/min — ABNORMAL LOW (ref >60)
Glucose, Bld: 119 mg/dL — ABNORMAL HIGH (ref 70–99)
Potassium: 4.9 mmol/L (ref 3.5–5.1)
Sodium: 139 mmol/L (ref 135–145)
Total Bilirubin: 0.4 mg/dL (ref 0.3–1.2)
Total Protein: 7.3 g/dL (ref 6.5–8.1)

## 2019-02-18 NOTE — Assessment & Plan Note (Signed)
So far, she tolerated niraparib well without major side effects We discussed hand hygiene and neutropenic precaution She will return here weekly for blood draw and I plan to see her next month for further follow-up

## 2019-02-18 NOTE — Assessment & Plan Note (Signed)
Her blood pressure is mildly elevated today but she is not symptomatic Observe only for now.

## 2019-02-18 NOTE — Progress Notes (Signed)
[facility] OFFICE PROGRESS NOTE  Patient Care Team: [name], MD as PCP - General (Internal Medicine)  ASSESSMENT & PLAN:  Fallopian tube cancer, carcinoma ([place]) So far, she tolerated niraparib well without major side effects We discussed hand hygiene and neutropenic precaution She will return here weekly for blood draw and I plan to see her next month for further follow-up  Elevated BP without diagnosis of hypertension Her blood pressure is mildly elevated today but she is not symptomatic Observe only for now.   No orders of the defined types were placed in this encounter.   INTERVAL HISTORY: Please see below for problem oriented charting. 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 Extensive omental nodularity highly worrisome for peritoneal carcinomatosis. Late recurrence of melanoma can present as peritoneal carcinomatosis. Ovarian cancer more commonly presents in this manner. Patient does have a predominately low density right adnexal lesion measuring up to 3.5 cm, although this lesion is not typical for ovarian cancer. 2. No evidence of bowel or ureteral obstruction. 3. No other definite signs of metastatic disease. There are small indeterminate low-density hepatic lesions.    02/08/2016 Tumor Marker    Patient's tumor was tested for the following markers: CA-125 Results of the  tumor marker test revealed 656.2    02/15/2016 Procedure    CT-guided core biopsy performed of omental mass just deep to the abdominal wall.    02/15/2016 Pathology Results    Omentum, biopsy, left - PAPILLARY SEROUS NEOPLASM, SEE COMMENT. Microscopic Comment There are papillary collections of largely low grade appearing cells with numerous psammoma bodies. Given the limited material it is difficult to distinguish between invasive implants of a serous borderline tumor and serous carcinoma, especially given the low grade appearance.    02/18/2016 Pathology Results    1. Omentum, resection for tumor INVASIVE IMPLANT OF HIGH GRADE SEROUS CARCINOMA 2. Uterus +/- tubes/ovaries, neoplastic HIGH GRADE SEROUS CARCINOMA INVOLVING LEFT TUBAL FIMBRIA SEROUS CARCINOMA WITH PREDOMINANT PSAMMOMA BODIES IMPLANT AT UTERUS SEROSA, BILATERAL FALLOPIAN TUBAL SEROSA, AND ANTERIOR PERITONEAL REFLECTION CERVIX: HISTOLOGICAL UNREMARKABLE ENDOMETRIUM: INACTIVE ENDOMETRIUM MYOMETRIUM: LEIOMYOMA LEFT OVARY: CYSTADENOFIBROMA RIGHT OVARY AND FALLOPIAN TUBE: HISTOLOGICAL UNREMARKABLE Microscopic Comment 2. ONCOLOGY TABLE - FALLOPIAN TUBE 1. Specimen, including laterality: Omentum, uterus, bilateral ovaries and fallopian tubes 2. Procedure: Hysterectomy, bilateral salpingo-oophorectomy and tumor debulking omentectomy 3. Lymph node sampling performed: No 4. Tumor site: uterus serosa, bilateral fallopian tubal serosa, peritoneal and omentum 5. Tumor location in fallopian tube: Left fallopian tubal fimbria 6. Specimen integrity (intact/ruptured/disrupted): Intact 7. Tumor size (cm): multi focal invasive omentum implant greater than 2 cm, left fallopian tube tumor 0.8 cm. 8. Histologic type: Serous carcinoma 9. Grade: 3 10. Microscopic tumor extension: uterus serosa, bilateral fallopian tubal serosa, peritoneal and omentum 11. Margins: NA 12. Lymph-Vascular invasion: identified 13. Lymph nodes: # examined: 0; #  positive: NA 14. TNM: pT3c, pNx 15.  FIGO Stage (based on pathologic findings, needs clinical correlation: IIIC 16. Comment: High grade serous carcinoma multifocally and extensively involves left fallopian tubal fimbria, omentum, uterus serosa, bilateral fallopian tubal serosa and peritoneal. At the left fallopian tube there are small foci of serous tubal intraepithelial carcinoma identified, so we conclude the carcinoma is fallopian tube origin    03/09/2016 Tumor Marker    Patient's tumor was tested for the following markers: CA-125 Results of the tumor marker test revealed 154.4    03/15/2016 Procedure    Technically successful right IJ power-injectable port catheter placement. Ready for routine use.    03/18/2016 - 07/13/2016 Chemotherapy    The patient had 6 cycles of carboplatin and taxol    04/14/2016 Tumor Marker    Patient's tumor was tested for the following markers: CA-125 Results of the tumor marker test revealed 36.7    04/25/2016 Genetic Testing    Patient has genetic testing done for germline mutation Results revealed patient has no mutation    04/28/2016 Tumor Marker    Patient's tumor was tested for the following markers: CA-125 Results of the tumor marker test revealed 24.6    06/21/2016 Tumor Marker    Patient's tumor was tested for the following markers: CA-125 Results of the tumor marker test revealed 16.6    08/01/2016 Tumor Marker    Patient's tumor was tested for the following markers: CA-125 Results of the tumor marker test revealed 17.2    08/05/2016 Imaging    Interval TAH-BSO. No evidence of residual pelvic mass or metastatic disease within the abdomen or pelvis. No other acute findings.     11/04/2016 Tumor Marker    Patient's tumor was tested for the following markers: CA-125 Results of the tumor marker test revealed 13.6    01/20/2017 Tumor Marker    Patient's tumor was tested for the following markers: CA-125 Results of the tumor marker test revealed  13.8    04/14/2017 Tumor Marker    Patient's tumor was tested for the following markers: CA-125 Results of the tumor marker test revealed 16.1    06/13/2017 Imaging    No acute findings.  No mass or hernia identified.  Colonic diverticulosis, without radiographic evidence of diverticulitis.  Aortic atherosclerosis.    07/03/2017 Tumor Marker    Patient's tumor was tested for the following markers: CA-125 Results of the tumor marker test revealed 18.4    09/19/2017 Tumor Marker    Patient's tumor was tested for the following markers: CA-125 Results of the tumor marker test revealed 18.5    01/23/2018 Tumor Marker    Patient's tumor was tested for the following markers: CA-125 Results of the tumor marker test revealed 35.4    01/30/2018 Imaging    1. No evidence of local fallopian tube carcinoma recurrence within the pelvis. Post hysterectomy. 2. No evidence of metastatic peritoneal disease or omental disease. No solid organ metastasis.     03/20/2018 Tumor Marker    Patient's tumor was tested for the following markers: CA-125 Results of the tumor marker test revealed 73    05/02/2018 Tumor Marker    Patient's tumor was tested for the following markers: CA-125 Results of the tumor marker test revealed 127.1    05/08/2018 Imaging    CT abdomen and pelvis 1. 7 cm left subdiaphragmatic collection of loculated fluid versus low-density soft tissue. Given the patient's history of rising CA 125, metastatic disease considered highly likely.  2. Areas of fluid or recurrent disease identified in  the right lower quadrant adjacent to the cecum and along right lower quadrant small bowel loops.    05/14/2018 Tumor Marker    Patient's tumor was tested for the following markers: CA-125 Results of the tumor marker test revealed 166     Genetic Testing    Patient has genetic testing done for ER/PR and MMR. Results revealed patient has the following on 02/18/2016 surgical pathology: ER 20%, PR  0% MMR: normal     Genetic Testing    Patient has genetic testing done for MSI. Results revealed patient has the following: MSI stable    05/18/2018 - 12/31/2018 Chemotherapy    The patient had carboplatin and Doxil    07/16/2018 Tumor Marker    Patient's tumor was tested for the following markers: CA-125 Results of the tumor marker test revealed 28.7    08/10/2018 Imaging    CT imaging:  Decreased size of peritoneal soft tissue masses in left upper quadrant, consistent with decreased metastatic disease.  No new or progressive metastatic disease. No other acute findings.  Colonic diverticulosis, without radiographic evidence of diverticulitis.  Stable small paraumbilical ventral hernia containing transverse colon.    08/13/2018 Tumor Marker    Patient's tumor was tested for the following markers: CA-125 Results of the tumor marker test revealed 21.6    08/24/2018 Echocardiogram    LV EF: 60% -  65%    10/15/2018 Tumor Marker    Patient's tumor was tested for the following markers: CA-125 Results of the tumor marker test revealed 20.1    10/19/2018 Imaging    Ct scan of abdomen and pelvis Status post hysterectomy, bilateral salpingo-oophorectomy, and omentectomy.  Two peritoneal implants in the left upper abdomen are stable versus mildly decreased, as above. No abdominopelvic ascites.  No evidence of new/progressive metastatic disease.      11/19/2018 Tumor Marker    Patient's tumor was tested for the following markers: CA-125 Results of the tumor marker test revealed 21.7    11/23/2018 Echocardiogram    1. The left ventricle has normal systolic function of 86-76%. The cavity size was normal. There is no increased left ventricular wall thickness. Echo evidence of impaired diastolic relaxation.  2. The right ventricle has normal systolic function. The cavity was normal. There is no increase in right ventricular wall thickness.  3. The mitral valve is normal in structure.  There is mild mitral annular calcification present.  4. The tricuspid valve is normal in structure.  5. The aortic valve is tricuspid There is mild thickening of the aortic valve.  6. The pulmonic valve was normal in structure. Pulmonic valve regurgitation is mild by color flow Doppler.  7. Normal LV systolic function; mild diastolic dysfunction.    12/31/2018 Tumor Marker    Patient's tumor was tested for the following markers: CA-125 Results of the tumor marker test revealed 17.1    01/31/2019 Imaging    1. Left upper quadrant peritoneal implants described previously have decreased in the interval. No new or progressive findings in the abdomen/pelvis today. No free fluid. 2. Right paraumbilical ventral hernia contains a small knuckle of transverse colon without complicating features. 3.  Aortic Atherosclerois (ICD10-170.0)  Aortic Atherosclerosis (ICD10-I70.0).    01/31/2019 Tumor Marker    Patient's tumor was tested for the following markers: CA-125 Results of the tumor marker test revealed 18.9    02/11/2019 -  Chemotherapy    The patient is taking Niraparib     REVIEW OF SYSTEMS:   Constitutional:  Denies fevers, chills or abnormal weight loss Eyes: Denies blurriness of vision Ears, nose, mouth, throat, and face: Denies mucositis or sore throat Respiratory: Denies cough, dyspnea or wheezes Cardiovascular: Denies palpitation, chest discomfort or lower extremity swelling Gastrointestinal:  Denies nausea, heartburn or change in bowel habits Skin: Denies abnormal skin rashes Lymphatics: Denies new lymphadenopathy or easy bruising Neurological:Denies numbness, tingling or new weaknesses Behavioral/Psych: Mood is stable, no new changes  All other systems were reviewed with the patient and are negative.  I have reviewed the past medical history, past surgical history, social history and family history with the patient and they are unchanged from previous note.  ALLERGIES:  has No  Known Allergies.  MEDICATIONS:  Current Outpatient Medications  Medication Sig Dispense Refill  . Cholecalciferol (VITAMIN D) 2000 units CAPS Take 1 capsule by mouth daily.    Marland Kitchen ezetimibe (ZETIA) 10 MG tablet Take 10 mg by mouth daily.    Alanda Slim Tosylate 100 MG CAPS Take 200 mg by mouth daily. 60 capsule 11  . prochlorperazine (COMPAZINE) 10 MG tablet Take 1 tablet (10 mg total) by mouth every 6 (six) hours as needed for nausea or vomiting. 30 tablet 1   No current facility-administered medications for this visit.     PHYSICAL EXAMINATION: ECOG PERFORMANCE STATUS: 0 - Asymptomatic  Vitals:   02/18/19 1047  BP: (!) 159/94  Pulse: 94  Resp: 18  Temp: 99 F (37.2 C)  SpO2: 98%   Filed Weights   02/18/19 1047  Weight: 150 lb 9.6 oz (68.3 kg)    GENERAL:alert, no distress and comfortable SKIN: skin color, texture, turgor are normal, no rashes or significant lesions EYES: normal, Conjunctiva are pink and non-injected, sclera clear OROPHARYNX:no exudate, no erythema and lips, buccal mucosa, and tongue normal  NECK: supple, thyroid normal size, non-tender, without nodularity LYMPH:  no palpable lymphadenopathy in the cervical, axillary or inguinal LUNGS: clear to auscultation and percussion with normal breathing effort HEART: regular rate & rhythm and no murmurs and no lower extremity edema ABDOMEN:abdomen soft, non-tender and normal bowel sounds Musculoskeletal:no cyanosis of digits and no clubbing  NEURO: alert & oriented x 3 with fluent speech, no focal motor/sensory deficits  LABORATORY DATA:  I have reviewed the data as listed    Component Value Date/Time   NA 140 01/31/2019 0856   NA 139 06/07/2017 1430   K 4.3 01/31/2019 0856   K 3.8 06/07/2017 1430   CL 105 01/31/2019 0856   CO2 26 01/31/2019 0856   CO2 27 06/07/2017 1430   GLUCOSE 85 01/31/2019 0856   GLUCOSE 124 06/07/2017 1430   BUN 12 01/31/2019 0856   BUN 14.0 06/07/2017 1430   CREATININE 0.78  01/31/2019 0856   CREATININE 0.8 06/07/2017 1430   CALCIUM 9.1 01/31/2019 0856   CALCIUM 9.3 06/07/2017 1430   PROT 6.6 01/31/2019 0856   PROT 6.6 09/30/2016 1027   ALBUMIN 3.6 01/31/2019 0856   ALBUMIN 3.5 09/30/2016 1027   AST 18 01/31/2019 0856   AST 18 09/30/2016 1027   ALT 26 01/31/2019 0856   ALT 21 09/30/2016 1027   ALKPHOS 66 01/31/2019 0856   ALKPHOS 77 09/30/2016 1027   BILITOT 0.3 01/31/2019 0856   BILITOT 0.37 09/30/2016 1027   GFRNONAA >60 01/31/2019 0856   GFRAA >60 01/31/2019 0856    No results found for: SPEP, UPEP  Lab Results  Component Value Date   WBC 4.3 02/18/2019   NEUTROABS 2.6 02/18/2019   HGB 12.3  02/18/2019   HCT 36.8 02/18/2019   MCV 100.3 (H) 02/18/2019   PLT 219 02/18/2019      Chemistry      Component Value Date/Time   NA 140 01/31/2019 0856   NA 139 06/07/2017 1430   K 4.3 01/31/2019 0856   K 3.8 06/07/2017 1430   CL 105 01/31/2019 0856   CO2 26 01/31/2019 0856   CO2 27 06/07/2017 1430   BUN 12 01/31/2019 0856   BUN 14.0 06/07/2017 1430   CREATININE 0.78 01/31/2019 0856   CREATININE 0.8 06/07/2017 1430      Component Value Date/Time   CALCIUM 9.1 01/31/2019 0856   CALCIUM 9.3 06/07/2017 1430   ALKPHOS 66 01/31/2019 0856   ALKPHOS 77 09/30/2016 1027   AST 18 01/31/2019 0856   AST 18 09/30/2016 1027   ALT 26 01/31/2019 0856   ALT 21 09/30/2016 1027   BILITOT 0.3 01/31/2019 0856   BILITOT 0.37 09/30/2016 1027       RADIOGRAPHIC STUDIES: I have personally reviewed the radiological images as listed and agreed with the findings in the report. Ct Abdomen Pelvis W Contrast  Result Date: 01/31/2019 CLINICAL DATA:  Fallopian tube cancer. EXAM: CT ABDOMEN AND PELVIS WITH CONTRAST TECHNIQUE: Multidetector CT imaging of the abdomen and pelvis was performed using the standard protocol following bolus administration of intravenous contrast. CONTRAST:  12m OMNIPAQUE IOHEXOL 300 MG/ML  SOLN COMPARISON:  10/22/2018 FINDINGS: Lower  chest: Unremarkable. Hepatobiliary: No suspicious focal abnormality within the liver parenchyma. There is no evidence for gallstones, gallbladder wall thickening, or pericholecystic fluid. No intrahepatic or extrahepatic biliary dilation. Pancreas: No focal mass lesion. No dilatation of the main duct. No intraparenchymal cyst. No peripancreatic edema. Spleen: No splenomegaly. No focal mass lesion. Adrenals/Urinary Tract: No adrenal nodule or mass. Central sinus cysts noted bilaterally. No evidence for hydroureter. The urinary bladder appears normal for the degree of distention. Stomach/Bowel: Stomach is unremarkable. No gastric wall thickening. No evidence of outlet obstruction. Duodenum is normally positioned as is the ligament of Treitz. No small bowel wall thickening. No small bowel dilatation. The terminal ileum is normal. The appendix is normal. No gross colonic mass. No colonic wall thickening. Diverticular changes are noted in the left colon without evidence of diverticulitis. Vascular/Lymphatic: There is abdominal aortic atherosclerosis without aneurysm. There is no gastrohepatic or hepatoduodenal ligament lymphadenopathy. No intraperitoneal or retroperitoneal lymphadenopathy. No pelvic sidewall lymphadenopathy. Reproductive: The uterus is surgically absent. There is no adnexal mass. Other: No intraperitoneal free fluid. Peritoneal implants in the left upper quadrant are decreased in the interval. Index more cranial lesion measures 2.5 x 3.1 cm today (13/2) compared to 3.0 x 3.7 cm previously. More inferior and smaller nodule measures 1.4 x 1.2 cm today compared to 2.3 x 1.8 cm previously. Musculoskeletal: No worrisome lytic or sclerotic osseous abnormality. Right paraumbilical ventral hernia contains short segment of transverse colon without complicating features. IMPRESSION: 1. Left upper quadrant peritoneal implants described previously have decreased in the interval. No new or progressive findings in the  abdomen/pelvis today. No free fluid. 2. Right paraumbilical ventral hernia contains a small knuckle of transverse colon without complicating features. 3.  Aortic Atherosclerois (ICD10-170.0) Aortic Atherosclerosis (ICD10-I70.0). Electronically Signed   By: EMisty StanleyM.D.   On: 01/31/2019 15:01    All questions were answered. The patient knows to call the clinic with any problems, questions or concerns. No barriers to learning was detected.  I spent 15 minutes counseling the patient face to face. The  total time spent in the appointment was 20 minutes and more than 50% was on counseling and review of test results  [name], MD 02/18/2019 11:04 AM

## 2019-02-19 ENCOUNTER — Telehealth: Payer: Self-pay | Admitting: Hematology and Oncology

## 2019-02-19 NOTE — Telephone Encounter (Signed)
Tried to reach regarding schedule °

## 2019-02-25 ENCOUNTER — Inpatient Hospital Stay: Payer: Medicare Other

## 2019-02-25 ENCOUNTER — Other Ambulatory Visit: Payer: Self-pay

## 2019-02-25 DIAGNOSIS — Z95828 Presence of other vascular implants and grafts: Secondary | ICD-10-CM

## 2019-02-25 DIAGNOSIS — R030 Elevated blood-pressure reading, without diagnosis of hypertension: Secondary | ICD-10-CM | POA: Diagnosis not present

## 2019-02-25 DIAGNOSIS — C5702 Malignant neoplasm of left fallopian tube: Secondary | ICD-10-CM

## 2019-02-25 DIAGNOSIS — D61818 Other pancytopenia: Secondary | ICD-10-CM | POA: Diagnosis not present

## 2019-02-25 DIAGNOSIS — C5700 Malignant neoplasm of unspecified fallopian tube: Secondary | ICD-10-CM

## 2019-02-25 LAB — CBC WITH DIFFERENTIAL/PLATELET
Abs Immature Granulocytes: 0 10*3/uL (ref 0.00–0.07)
Basophils Absolute: 0 10*3/uL (ref 0.0–0.1)
Basophils Relative: 0 %
Eosinophils Absolute: 0.1 10*3/uL (ref 0.0–0.5)
Eosinophils Relative: 3 %
HCT: 33.6 % — ABNORMAL LOW (ref 36.0–46.0)
Hemoglobin: 11.2 g/dL — ABNORMAL LOW (ref 12.0–15.0)
Immature Granulocytes: 0 %
Lymphocytes Relative: 16 %
Lymphs Abs: 0.4 10*3/uL — ABNORMAL LOW (ref 0.7–4.0)
MCH: 33.1 pg (ref 26.0–34.0)
MCHC: 33.3 g/dL (ref 30.0–36.0)
MCV: 99.4 fL (ref 80.0–100.0)
Monocytes Absolute: 0.3 10*3/uL (ref 0.1–1.0)
Monocytes Relative: 13 %
Neutro Abs: 1.7 10*3/uL (ref 1.7–7.7)
Neutrophils Relative %: 68 %
Platelets: 156 10*3/uL (ref 150–400)
RBC: 3.38 MIL/uL — ABNORMAL LOW (ref 3.87–5.11)
RDW: 12.3 % (ref 11.5–15.5)
WBC: 2.5 10*3/uL — ABNORMAL LOW (ref 4.0–10.5)
nRBC: 0 % (ref 0.0–0.2)

## 2019-02-25 LAB — COMPREHENSIVE METABOLIC PANEL
ALT: 37 U/L (ref 0–44)
AST: 27 U/L (ref 15–41)
Albumin: 3.7 g/dL (ref 3.5–5.0)
Alkaline Phosphatase: 88 U/L (ref 38–126)
Anion gap: 12 (ref 5–15)
BUN: 14 mg/dL (ref 8–23)
CO2: 22 mmol/L (ref 22–32)
Calcium: 9 mg/dL (ref 8.9–10.3)
Chloride: 103 mmol/L (ref 98–111)
Creatinine, Ser: 0.83 mg/dL (ref 0.44–1.00)
GFR calc Af Amer: >60 mL/min (ref >60)
GFR calc non Af Amer: >60 mL/min (ref >60)
Glucose, Bld: 107 mg/dL — ABNORMAL HIGH (ref 70–99)
Potassium: 4 mmol/L (ref 3.5–5.1)
Sodium: 137 mmol/L (ref 135–145)
Total Bilirubin: 0.3 mg/dL (ref 0.3–1.2)
Total Protein: 7 g/dL (ref 6.5–8.1)

## 2019-02-25 MED ORDER — SODIUM CHLORIDE 0.9% FLUSH
10.0000 mL | Freq: Once | INTRAVENOUS | Status: DC
  Filled 2019-02-25: qty 10

## 2019-02-25 MED ORDER — HEPARIN SOD (PORK) LOCK FLUSH 100 UNIT/ML IV SOLN
250.0000 [IU] | Freq: Once | INTRAVENOUS | Status: DC
  Filled 2019-02-25: qty 5

## 2019-02-26 LAB — CA 125: Cancer Antigen (CA) 125: 18.6 U/mL (ref 0.0–38.1)

## 2019-02-27 NOTE — Telephone Encounter (Signed)
Pt left a vm  About needing an order for her mamo

## 2019-02-27 NOTE — Telephone Encounter (Signed)
02/27/2019 5:24 PM: Returned call to patient, who stated that when she had a mammogram done in November, a radiologist told her that her next mammogram needs to be diagnostic because "they saw something there." Patient is scheduled on 5/22 at [facility]. Patient stated she is no longer on anastrozole. Advised patient that Julianne Rice, NP, will be consulted about which order is correct and if necessary, the order will be changed. Patient denied further questions or concerns.

## 2019-02-28 ENCOUNTER — Telehealth: Payer: Self-pay

## 2019-02-28 ENCOUNTER — Encounter

## 2019-02-28 NOTE — Telephone Encounter (Signed)
Oral Oncology Patient Advocate Encounter  The patient called me to ask how she would get her Zejula refilled. I explained that the pharmacy would call her 5-7 days before she ran out of medicine to refill it and ship it to her.   I checked the pharmacy system and it appears that the pharmacy attempted to contact the patient to schedule the refill yesterday. I advised her to give the pharmacy a call back to schedule the refill. She stated she had the phone number.  The patient also asked me if I thought it was safe for her to have a refrigerator delivered to her home, I advised her to speak with a nurse or the doctor about that because I could not give her direct advice on that. She asked me what I was personally doing to keep safe and I explained that I wash my hands frequently, use hand sanitizer, wear a mask when necessary and keep my distance from others. She wanted me to tell her what she could and could not do, I advised her to speak with the nurse or doctor. I am working from home today and do not have the ability to transfer.  The patient verbalized understanding and appreciation of our conversation today.  [place] [facility] [place] Phone [phone] Fax [phone] 02/28/2019   11:37 AM

## 2019-03-04 ENCOUNTER — Other Ambulatory Visit: Payer: Self-pay

## 2019-03-04 ENCOUNTER — Inpatient Hospital Stay: Payer: Medicare Other

## 2019-03-04 DIAGNOSIS — R030 Elevated blood-pressure reading, without diagnosis of hypertension: Secondary | ICD-10-CM | POA: Diagnosis not present

## 2019-03-04 DIAGNOSIS — C5700 Malignant neoplasm of unspecified fallopian tube: Secondary | ICD-10-CM

## 2019-03-04 DIAGNOSIS — D61818 Other pancytopenia: Secondary | ICD-10-CM | POA: Diagnosis not present

## 2019-03-04 DIAGNOSIS — C5702 Malignant neoplasm of left fallopian tube: Secondary | ICD-10-CM | POA: Diagnosis not present

## 2019-03-04 LAB — CBC WITH DIFFERENTIAL/PLATELET
Abs Immature Granulocytes: 0.02 10*3/uL (ref 0.00–0.07)
Basophils Absolute: 0 10*3/uL (ref 0.0–0.1)
Basophils Relative: 1 %
Eosinophils Absolute: 0.1 10*3/uL (ref 0.0–0.5)
Eosinophils Relative: 3 %
HCT: 34.1 % — ABNORMAL LOW (ref 36.0–46.0)
Hemoglobin: 11.6 g/dL — ABNORMAL LOW (ref 12.0–15.0)
Immature Granulocytes: 1 %
Lymphocytes Relative: 19 %
Lymphs Abs: 0.6 10*3/uL — ABNORMAL LOW (ref 0.7–4.0)
MCH: 32.7 pg (ref 26.0–34.0)
MCHC: 34 g/dL (ref 30.0–36.0)
MCV: 96.1 fL (ref 80.0–100.0)
Monocytes Absolute: 0.4 10*3/uL (ref 0.1–1.0)
Monocytes Relative: 13 %
Neutro Abs: 1.9 10*3/uL (ref 1.7–7.7)
Neutrophils Relative %: 63 %
Platelets: 200 10*3/uL (ref 150–400)
RBC: 3.55 MIL/uL — ABNORMAL LOW (ref 3.87–5.11)
RDW: 12 % (ref 11.5–15.5)
WBC: 3 10*3/uL — ABNORMAL LOW (ref 4.0–10.5)
nRBC: 0 % (ref 0.0–0.2)

## 2019-03-04 LAB — COMPREHENSIVE METABOLIC PANEL
ALT: 35 U/L (ref 0–44)
AST: 22 U/L (ref 15–41)
Albumin: 3.8 g/dL (ref 3.5–5.0)
Alkaline Phosphatase: 98 U/L (ref 38–126)
Anion gap: 9 (ref 5–15)
BUN: 12 mg/dL (ref 8–23)
CO2: 23 mmol/L (ref 22–32)
Calcium: 9.1 mg/dL (ref 8.9–10.3)
Chloride: 106 mmol/L (ref 98–111)
Creatinine, Ser: 0.96 mg/dL (ref 0.44–1.00)
GFR calc Af Amer: >60 mL/min (ref >60)
GFR calc non Af Amer: 57 mL/min — ABNORMAL LOW (ref >60)
Glucose, Bld: 141 mg/dL — ABNORMAL HIGH (ref 70–99)
Potassium: 4.3 mmol/L (ref 3.5–5.1)
Sodium: 138 mmol/L (ref 135–145)
Total Bilirubin: 0.4 mg/dL (ref 0.3–1.2)
Total Protein: 7.2 g/dL (ref 6.5–8.1)

## 2019-03-05 ENCOUNTER — Telehealth: Payer: Self-pay | Admitting: Oncology

## 2019-03-05 NOTE — Telephone Encounter (Signed)
Jessica Johnston called and asked about her lab results from yesterday.  Reviewed WBC count with her.  She also asked if she should be as careful with precautions as when she was getting chemotherapy.  Advised her to wear a mask when out in public, to practice social distancing and good hand hygiene.  She verbalized understanding and agreement.

## 2019-03-07 ENCOUNTER — Telehealth: Payer: Self-pay | Admitting: Pharmacist

## 2019-03-07 NOTE — Telephone Encounter (Signed)
Oral Oncology Pharmacist Encounter  Received call from patient with questions about medication interactions with Zejula. Patient specifically asking about acetaminophen as she has a low-grade headache that occurs on most days. Patient states that headache resolves very quickly after about 30 minutes of lying down. She states this was a pre-existing issue prior to Zejula initiation. She states her husband also complains of low-grade headaches that occur with some frequency.  No medicine interactions between Zejula and acetaminophen or ibuprofen. Patient informed that she is able to take either medication for resolution of headaches, when they occur. I do not see anything prohibitive in patient's PMH list or on labs that would show the either of these agents were unsafe. Patient informed to take them in moderation.  Patient informed that there is a small chance of increase in headaches with the use of Zejula. Patient will be mindful of how often headaches occur, and with what severity, and if this differs to prior to [facility] initiation. She will alert the office with any additional questions or concerns.  Patient expressed understanding and is in agreement with above plan. Office visits for 03/18/2019 confirmed with patient.  Johny Drilling, PharmD, BCPS, BCOP  03/07/2019 1:45 PM Oral Oncology Clinic [phone]

## 2019-03-08 ENCOUNTER — Telehealth: Payer: Self-pay | Admitting: Gynecologic Oncology

## 2019-03-08 ENCOUNTER — Inpatient Hospital Stay: Admit: 2019-03-08 | Payer: MEDICARE | Attending: Nurse Practitioner | Primary: Family Medicine

## 2019-03-08 DIAGNOSIS — C50211 Malignant neoplasm of upper-inner quadrant of right female breast: Secondary | ICD-10-CM

## 2019-03-12 ENCOUNTER — Telehealth: Payer: Self-pay | Admitting: Oncology

## 2019-03-12 NOTE — Telephone Encounter (Signed)
My next available is Friday I am not able to call her to discuss She has a long relationship with Dr. Denman George and if she agrees to stop after she discuss with Dr. Denman George she does not have to see me

## 2019-03-12 NOTE — Telephone Encounter (Signed)
Called Zarra back and she would like to reschedule her appointment with Dr. Alvy Bimler to Friday, 03/15/19.  Discussed apt for labs at 8:45 and Dr. Alvy Bimler at 9:15.  She verbalized understanding and agreement.

## 2019-03-12 NOTE — Telephone Encounter (Signed)
Jessica Johnston called and said she has been thinking about the options for treatment that Dr. Alvy Bimler discussed at her last appointment.  She is currently taking Zejula (has 2 tablets left) and is trying to decide on whether to continue taking it.  She said she is not feeling good - she is having fatigue, muscle aches and reduced appetite.  She asked if Dr. Alvy Bimler would be able to call her to discuss this or if she could move up her next appointment  She also mentioned that Dr. Denman George will be calling her today to discuss.

## 2019-03-13 ENCOUNTER — Telehealth: Payer: Self-pay | Admitting: Oncology

## 2019-03-13 NOTE — Telephone Encounter (Signed)
Jessica Johnston called and asked if it would be ok for there 62 1/78 year old grandson to come in her house.  Advised her that it should be OK and to wear a mask and practice social distancing.  She verbalized agreement.

## 2019-03-15 ENCOUNTER — Inpatient Hospital Stay: Payer: Medicare Other

## 2019-03-15 ENCOUNTER — Encounter: Payer: Self-pay | Admitting: Hematology and Oncology

## 2019-03-15 ENCOUNTER — Inpatient Hospital Stay (HOSPITAL_BASED_OUTPATIENT_CLINIC_OR_DEPARTMENT_OTHER): Payer: Medicare Other | Admitting: Hematology and Oncology

## 2019-03-15 ENCOUNTER — Other Ambulatory Visit: Payer: Self-pay

## 2019-03-15 DIAGNOSIS — C5700 Malignant neoplasm of unspecified fallopian tube: Secondary | ICD-10-CM

## 2019-03-15 DIAGNOSIS — R030 Elevated blood-pressure reading, without diagnosis of hypertension: Secondary | ICD-10-CM | POA: Diagnosis not present

## 2019-03-15 DIAGNOSIS — C5702 Malignant neoplasm of left fallopian tube: Secondary | ICD-10-CM | POA: Diagnosis not present

## 2019-03-15 DIAGNOSIS — D61818 Other pancytopenia: Secondary | ICD-10-CM

## 2019-03-15 LAB — COMPREHENSIVE METABOLIC PANEL
ALT: 22 U/L (ref 0–44)
AST: 17 U/L (ref 15–41)
Albumin: 3.7 g/dL (ref 3.5–5.0)
Alkaline Phosphatase: 95 U/L (ref 38–126)
Anion gap: 9 (ref 5–15)
BUN: 12 mg/dL (ref 8–23)
CO2: 26 mmol/L (ref 22–32)
Calcium: 9.3 mg/dL (ref 8.9–10.3)
Chloride: 107 mmol/L (ref 98–111)
Creatinine, Ser: 0.89 mg/dL (ref 0.44–1.00)
GFR calc Af Amer: >60 mL/min (ref >60)
GFR calc non Af Amer: >60 mL/min (ref >60)
Glucose, Bld: 112 mg/dL — ABNORMAL HIGH (ref 70–99)
Potassium: 4.5 mmol/L (ref 3.5–5.1)
Sodium: 142 mmol/L (ref 135–145)
Total Bilirubin: 0.4 mg/dL (ref 0.3–1.2)
Total Protein: 6.9 g/dL (ref 6.5–8.1)

## 2019-03-15 LAB — CBC WITH DIFFERENTIAL/PLATELET
Abs Immature Granulocytes: 0.01 10*3/uL (ref 0.00–0.07)
Basophils Absolute: 0 10*3/uL (ref 0.0–0.1)
Basophils Relative: 0 %
Eosinophils Absolute: 0.1 10*3/uL (ref 0.0–0.5)
Eosinophils Relative: 2 %
HCT: 34.4 % — ABNORMAL LOW (ref 36.0–46.0)
Hemoglobin: 11.4 g/dL — ABNORMAL LOW (ref 12.0–15.0)
Immature Granulocytes: 0 %
Lymphocytes Relative: 22 %
Lymphs Abs: 0.7 10*3/uL (ref 0.7–4.0)
MCH: 33 pg (ref 26.0–34.0)
MCHC: 33.1 g/dL (ref 30.0–36.0)
MCV: 99.7 fL (ref 80.0–100.0)
Monocytes Absolute: 0.5 10*3/uL (ref 0.1–1.0)
Monocytes Relative: 15 %
Neutro Abs: 1.8 10*3/uL (ref 1.7–7.7)
Neutrophils Relative %: 61 %
Platelets: 195 10*3/uL (ref 150–400)
RBC: 3.45 MIL/uL — ABNORMAL LOW (ref 3.87–5.11)
RDW: 12.1 % (ref 11.5–15.5)
WBC: 3.1 10*3/uL — ABNORMAL LOW (ref 4.0–10.5)
nRBC: 0 % (ref 0.0–0.2)

## 2019-03-15 NOTE — Assessment & Plan Note (Signed)
Her blood counts are improving She is not symptomatic Observe only for now.

## 2019-03-15 NOTE — Progress Notes (Signed)
Jessica Johnston OFFICE PROGRESS NOTE  Patient Care Team: [name], MD as PCP - General (Internal Medicine)  ASSESSMENT & PLAN:  Fallopian tube cancer, carcinoma ([place]) Currently, she is not symptomatic She has made informed decision to stop niraparib due to concern for side effects and infection She is educated to watch out for signs and symptoms of cancer recurrence I will schedule maintenance port flushes I have communicated to schedule her for her to be reestablished with follow-up to see GYN oncologist  Pancytopenia, acquired ([place]) Her blood counts are improving She is not symptomatic Observe only for now.   No orders of the defined types were placed in this encounter.   INTERVAL HISTORY: Please see below for problem oriented charting. She returns for further follow-up and discussion about discontinuation of treatment The patient has read about side effects of niraparib She is concerned about risk of infection and poor quality of life while on treatment She denies recent nausea, bloating or changes in bowel habits She has stopped taking the pill 2 days ago She is eating healthy and changing her diet around.  She believes that vegan or vegetarian diet is healthier for her. 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 Extensive omental nodularity highly worrisome for peritoneal carcinomatosis. Late recurrence of melanoma can present as peritoneal carcinomatosis. Ovarian cancer more commonly presents in this manner. Patient does have a predominately low density  right adnexal lesion measuring up to 3.5 cm, although this lesion is not typical for ovarian cancer. 2. No evidence of bowel or ureteral obstruction. 3. No other definite signs of metastatic disease. There are small indeterminate low-density hepatic lesions.    02/08/2016 Tumor Marker    Patient's tumor was tested for the following markers: CA-125 Results of the tumor marker test revealed 656.2    02/15/2016 Procedure    CT-guided core biopsy performed of omental mass just deep to the abdominal wall.    02/15/2016 Pathology Results    Omentum, biopsy, left - PAPILLARY SEROUS NEOPLASM, SEE COMMENT. Microscopic Comment There are papillary collections of largely low grade appearing cells with numerous psammoma bodies. Given the limited material it is difficult to distinguish between invasive implants of a serous borderline tumor and serous carcinoma, especially given the low grade appearance.    02/18/2016 Pathology Results    1. Omentum, resection for tumor INVASIVE IMPLANT OF HIGH GRADE SEROUS CARCINOMA 2. Uterus +/- tubes/ovaries, neoplastic HIGH GRADE SEROUS CARCINOMA INVOLVING LEFT TUBAL FIMBRIA SEROUS CARCINOMA WITH PREDOMINANT PSAMMOMA BODIES IMPLANT AT UTERUS SEROSA, BILATERAL FALLOPIAN TUBAL SEROSA, AND ANTERIOR PERITONEAL REFLECTION CERVIX: HISTOLOGICAL UNREMARKABLE ENDOMETRIUM: INACTIVE ENDOMETRIUM MYOMETRIUM: LEIOMYOMA LEFT OVARY: CYSTADENOFIBROMA RIGHT OVARY AND FALLOPIAN TUBE: HISTOLOGICAL UNREMARKABLE Microscopic Comment 2. ONCOLOGY TABLE - FALLOPIAN TUBE 1. Specimen, including laterality: Omentum, uterus, bilateral ovaries and fallopian tubes 2. Procedure: Hysterectomy, bilateral salpingo-oophorectomy and tumor debulking omentectomy 3. Lymph node sampling performed: No 4. Tumor site: uterus serosa, bilateral fallopian tubal serosa, peritoneal and omentum 5. Tumor location in fallopian tube: Left fallopian tubal fimbria 6. Specimen  integrity (intact/ruptured/disrupted):  Intact 7. Tumor size (cm): multi focal invasive omentum implant greater than 2 cm, left fallopian tube tumor 0.8 cm. 8. Histologic type: Serous carcinoma 9. Grade: 3 10. Microscopic tumor extension: uterus serosa, bilateral fallopian tubal serosa, peritoneal and omentum 11. Margins: NA 12. Lymph-Vascular invasion: identified 13. Lymph nodes: # examined: 0; # positive: NA 14. TNM: pT3c, pNx 15. FIGO Stage (based on pathologic findings, needs clinical correlation: IIIC 16. Comment: High grade serous carcinoma multifocally and extensively involves left fallopian tubal fimbria, omentum, uterus serosa, bilateral fallopian tubal serosa and peritoneal. At the left fallopian tube there are small foci of serous tubal intraepithelial carcinoma identified, so we conclude the carcinoma is fallopian tube origin    03/09/2016 Tumor Marker    Patient's tumor was tested for the following markers: CA-125 Results of the tumor marker test revealed 154.4    03/15/2016 Procedure    Technically successful right IJ power-injectable port catheter placement. Ready for routine use.    03/18/2016 - 07/13/2016 Chemotherapy    The patient had 6 cycles of carboplatin and taxol    04/14/2016 Tumor Marker    Patient's tumor was tested for the following markers: CA-125 Results of the tumor marker test revealed 36.7    04/25/2016 Genetic Testing    Patient has genetic testing done for germline mutation Results revealed patient has no mutation    04/28/2016 Tumor Marker    Patient's tumor was tested for the following markers: CA-125 Results of the tumor marker test revealed 24.6    06/21/2016 Tumor Marker    Patient's tumor was tested for the following markers: CA-125 Results of the tumor marker test revealed 16.6    08/01/2016 Tumor Marker    Patient's tumor was tested for the following markers: CA-125 Results of the tumor marker test revealed 17.2    08/05/2016 Imaging    Interval TAH-BSO. No evidence of residual  pelvic mass or metastatic disease within the abdomen or pelvis. No other acute findings.     11/04/2016 Tumor Marker    Patient's tumor was tested for the following markers: CA-125 Results of the tumor marker test revealed 13.6    01/20/2017 Tumor Marker    Patient's tumor was tested for the following markers: CA-125 Results of the tumor marker test revealed 13.8    04/14/2017 Tumor Marker    Patient's tumor was tested for the following markers: CA-125 Results of the tumor marker test revealed 16.1    06/13/2017 Imaging    No acute findings.  No mass or hernia identified.  Colonic diverticulosis, without radiographic evidence of diverticulitis.  Aortic atherosclerosis.    07/03/2017 Tumor Marker    Patient's tumor was tested for the following markers: CA-125 Results of the tumor marker test revealed 18.4    09/19/2017 Tumor Marker    Patient's tumor was tested for the following markers: CA-125 Results of the tumor marker test revealed 18.5    01/23/2018 Tumor Marker    Patient's tumor was tested for the following markers: CA-125 Results of the tumor marker test revealed 35.4    01/30/2018 Imaging    1. No evidence of local fallopian tube carcinoma recurrence within the pelvis. Post hysterectomy. 2. No evidence of metastatic peritoneal disease or omental disease. No solid organ metastasis.     03/20/2018 Tumor Marker    Patient's tumor was tested for the following markers: CA-125 Results of the tumor marker test revealed 73    05/02/2018 Tumor Marker  Patient's tumor was tested for the following markers: CA-125 Results of the tumor marker test revealed 127.1    05/08/2018 Imaging    CT abdomen and pelvis 1. 7 cm left subdiaphragmatic collection of loculated fluid versus low-density soft tissue. Given the patient's history of rising CA 125, metastatic disease considered highly likely.  2. Areas of fluid or recurrent disease identified in the right lower quadrant adjacent to  the cecum and along right lower quadrant small bowel loops.    05/14/2018 Tumor Marker    Patient's tumor was tested for the following markers: CA-125 Results of the tumor marker test revealed 166     Genetic Testing    Patient has genetic testing done for ER/PR and MMR. Results revealed patient has the following on 02/18/2016 surgical pathology: ER 20%, PR 0% MMR: normal     Genetic Testing    Patient has genetic testing done for MSI. Results revealed patient has the following: MSI stable    05/18/2018 - 12/31/2018 Chemotherapy    The patient had carboplatin and Doxil    07/16/2018 Tumor Marker    Patient's tumor was tested for the following markers: CA-125 Results of the tumor marker test revealed 28.7    08/10/2018 Imaging    CT imaging:  Decreased size of peritoneal soft tissue masses in left upper quadrant, consistent with decreased metastatic disease.  No new or progressive metastatic disease. No other acute findings.  Colonic diverticulosis, without radiographic evidence of diverticulitis.  Stable small paraumbilical ventral hernia containing transverse colon.    08/13/2018 Tumor Marker    Patient's tumor was tested for the following markers: CA-125 Results of the tumor marker test revealed 21.6    08/24/2018 Echocardiogram    LV EF: 60% -  65%    10/15/2018 Tumor Marker    Patient's tumor was tested for the following markers: CA-125 Results of the tumor marker test revealed 20.1    10/19/2018 Imaging    Ct scan of abdomen and pelvis Status post hysterectomy, bilateral salpingo-oophorectomy, and omentectomy.  Two peritoneal implants in the left upper abdomen are stable versus mildly decreased, as above. No abdominopelvic ascites.  No evidence of new/progressive metastatic disease.      11/19/2018 Tumor Marker    Patient's tumor was tested for the following markers: CA-125 Results of the tumor marker test revealed 21.7    11/23/2018 Echocardiogram    1. The  left ventricle has normal systolic function of 42-70%. The cavity size was normal. There is no increased left ventricular wall thickness. Echo evidence of impaired diastolic relaxation.  2. The right ventricle has normal systolic function. The cavity was normal. There is no increase in right ventricular wall thickness.  3. The mitral valve is normal in structure. There is mild mitral annular calcification present.  4. The tricuspid valve is normal in structure.  5. The aortic valve is tricuspid There is mild thickening of the aortic valve.  6. The pulmonic valve was normal in structure. Pulmonic valve regurgitation is mild by color flow Doppler.  7. Normal LV systolic function; mild diastolic dysfunction.    12/31/2018 Tumor Marker    Patient's tumor was tested for the following markers: CA-125 Results of the tumor marker test revealed 17.1    01/31/2019 Imaging    1. Left upper quadrant peritoneal implants described previously have decreased in the interval. No new or progressive findings in the abdomen/pelvis today. No free fluid. 2. Right paraumbilical ventral hernia contains a small knuckle of  transverse colon without complicating features. 3.  Aortic Atherosclerois (ICD10-170.0)  Aortic Atherosclerosis (ICD10-I70.0).    01/31/2019 Tumor Marker    Patient's tumor was tested for the following markers: CA-125 Results of the tumor marker test revealed 18.9    02/11/2019 - 03/13/2019 Chemotherapy    The patient is taking Niraparib. She self discontinued after 1 month    02/25/2019 Tumor Marker    Patient's tumor was tested for the following markers: CA-125 Results of the tumor marker test revealed 18.6     REVIEW OF SYSTEMS:   Constitutional: Denies fevers, chills or abnormal weight loss Eyes: Denies blurriness of vision Ears, nose, mouth, throat, and face: Denies mucositis or sore throat Respiratory: Denies cough, dyspnea or wheezes Cardiovascular: Denies palpitation, chest  discomfort or lower extremity swelling Gastrointestinal:  Denies nausea, heartburn or change in bowel habits Skin: Denies abnormal skin rashes Lymphatics: Denies new lymphadenopathy or easy bruising Neurological:Denies numbness, tingling or new weaknesses Behavioral/Psych: Mood is stable, no new changes  All other systems were reviewed with the patient and are negative.  I have reviewed the past medical history, past surgical history, social history and family history with the patient and they are unchanged from previous note.  ALLERGIES:  has No Known Allergies.  MEDICATIONS:  Current Outpatient Medications  Medication Sig Dispense Refill  . Cholecalciferol (VITAMIN D) 2000 units CAPS Take 1 capsule by mouth daily.    Marland Kitchen ezetimibe (ZETIA) 10 MG tablet Take 10 mg by mouth daily.    . prochlorperazine (COMPAZINE) 10 MG tablet Take 1 tablet (10 mg total) by mouth every 6 (six) hours as needed for nausea or vomiting. 30 tablet 1   No current facility-administered medications for this visit.     PHYSICAL EXAMINATION: ECOG PERFORMANCE STATUS: 1 - Symptomatic but completely ambulatory  Vitals:   03/15/19 0923  BP: 135/64  Pulse: 87  Resp: 18  Temp: 98 F (36.7 C)  SpO2: 100%   Filed Weights   03/15/19 0923  Weight: 142 lb 12.8 oz (64.8 kg)    GENERAL:alert, no distress and comfortable Musculoskeletal:no cyanosis of digits and no clubbing  NEURO: alert & oriented x 3 with fluent speech, no focal motor/sensory deficits  LABORATORY DATA:  I have reviewed the data as listed    Component Value Date/Time   NA 142 03/15/2019 0912   NA 139 06/07/2017 1430   K 4.5 03/15/2019 0912   K 3.8 06/07/2017 1430   CL 107 03/15/2019 0912   CO2 26 03/15/2019 0912   CO2 27 06/07/2017 1430   GLUCOSE 112 (H) 03/15/2019 0912   GLUCOSE 124 06/07/2017 1430   BUN 12 03/15/2019 0912   BUN 14.0 06/07/2017 1430   CREATININE 0.89 03/15/2019 0912   CREATININE 0.8 06/07/2017 1430   CALCIUM 9.3  03/15/2019 0912   CALCIUM 9.3 06/07/2017 1430   PROT 6.9 03/15/2019 0912   PROT 6.6 09/30/2016 1027   ALBUMIN 3.7 03/15/2019 0912   ALBUMIN 3.5 09/30/2016 1027   AST 17 03/15/2019 0912   AST 18 09/30/2016 1027   ALT 22 03/15/2019 0912   ALT 21 09/30/2016 1027   ALKPHOS 95 03/15/2019 0912   ALKPHOS 77 09/30/2016 1027   BILITOT 0.4 03/15/2019 0912   BILITOT 0.37 09/30/2016 1027   GFRNONAA >60 03/15/2019 0912   GFRAA >60 03/15/2019 0912    No results found for: SPEP, UPEP  Lab Results  Component Value Date   WBC 3.1 (L) 03/15/2019   NEUTROABS 1.8 03/15/2019  HGB 11.4 (L) 03/15/2019   HCT 34.4 (L) 03/15/2019   MCV 99.7 03/15/2019   PLT 195 03/15/2019      Chemistry      Component Value Date/Time   NA 142 03/15/2019 0912   NA 139 06/07/2017 1430   K 4.5 03/15/2019 0912   K 3.8 06/07/2017 1430   CL 107 03/15/2019 0912   CO2 26 03/15/2019 0912   CO2 27 06/07/2017 1430   BUN 12 03/15/2019 0912   BUN 14.0 06/07/2017 1430   CREATININE 0.89 03/15/2019 0912   CREATININE 0.8 06/07/2017 1430      Component Value Date/Time   CALCIUM 9.3 03/15/2019 0912   CALCIUM 9.3 06/07/2017 1430   ALKPHOS 95 03/15/2019 0912   ALKPHOS 77 09/30/2016 1027   AST 17 03/15/2019 0912   AST 18 09/30/2016 1027   ALT 22 03/15/2019 0912   ALT 21 09/30/2016 1027   BILITOT 0.4 03/15/2019 0912   BILITOT 0.37 09/30/2016 1027       All questions were answered. The patient knows to call the clinic with any problems, questions or concerns. No barriers to learning was detected.  I spent 10 minutes counseling the patient face to face. The total time spent in the appointment was 15 minutes and more than 50% was on counseling and review of test results  [name], MD 03/15/2019 10:51 AM

## 2019-03-15 NOTE — Assessment & Plan Note (Signed)
Currently, she is not symptomatic She has made informed decision to stop niraparib due to concern for side effects and infection She is educated to watch out for signs and symptoms of cancer recurrence I will schedule maintenance port flushes I have communicated to schedule her for her to be reestablished with follow-up to see GYN oncologist

## 2019-03-18 ENCOUNTER — Other Ambulatory Visit: Payer: Medicare Other

## 2019-03-18 ENCOUNTER — Ambulatory Visit: Payer: Medicare Other | Admitting: Hematology and Oncology

## 2019-03-19 ENCOUNTER — Other Ambulatory Visit: Payer: Self-pay | Admitting: Hematology and Oncology

## 2019-03-19 ENCOUNTER — Telehealth: Payer: Self-pay | Admitting: Hematology and Oncology

## 2019-03-19 NOTE — Telephone Encounter (Signed)
Scheduled appt per sch msg. Called and spoke wih patient. Confirmed appt dates and times

## 2019-03-20 DIAGNOSIS — E559 Vitamin D deficiency, unspecified: Secondary | ICD-10-CM | POA: Diagnosis not present

## 2019-03-20 DIAGNOSIS — Z0000 Encounter for general adult medical examination without abnormal findings: Secondary | ICD-10-CM | POA: Diagnosis not present

## 2019-03-20 DIAGNOSIS — C5700 Malignant neoplasm of unspecified fallopian tube: Secondary | ICD-10-CM | POA: Diagnosis not present

## 2019-03-20 DIAGNOSIS — Z1331 Encounter for screening for depression: Secondary | ICD-10-CM | POA: Diagnosis not present

## 2019-03-20 DIAGNOSIS — K469 Unspecified abdominal hernia without obstruction or gangrene: Secondary | ICD-10-CM | POA: Diagnosis not present

## 2019-03-20 DIAGNOSIS — C439 Malignant melanoma of skin, unspecified: Secondary | ICD-10-CM | POA: Diagnosis not present

## 2019-03-20 DIAGNOSIS — R945 Abnormal results of liver function studies: Secondary | ICD-10-CM | POA: Diagnosis not present

## 2019-03-20 DIAGNOSIS — R9431 Abnormal electrocardiogram [ECG] [EKG]: Secondary | ICD-10-CM | POA: Diagnosis not present

## 2019-03-20 DIAGNOSIS — E785 Hyperlipidemia, unspecified: Secondary | ICD-10-CM | POA: Diagnosis not present

## 2019-03-20 DIAGNOSIS — R7301 Impaired fasting glucose: Secondary | ICD-10-CM | POA: Diagnosis not present

## 2019-03-20 DIAGNOSIS — M8580 Other specified disorders of bone density and structure, unspecified site: Secondary | ICD-10-CM | POA: Diagnosis not present

## 2019-03-25 ENCOUNTER — Encounter

## 2019-03-26 MED ORDER — ATORVASTATIN 40 MG TAB
40 mg | ORAL_TABLET | ORAL | 3 refills | Status: DC
Start: 2019-03-26 — End: 2020-03-17

## 2019-03-28 ENCOUNTER — Telehealth: Payer: Self-pay | Admitting: Oncology

## 2019-03-28 NOTE — Telephone Encounter (Signed)
Jessica Johnston called and asked what kind of precautions she needs to take now that she is not taking Zejula.  Advised her to wear a mask when out in public/grocery store, practice good handwashing and social distancing.  She verbalized understanding and agreement.

## 2019-03-29 ENCOUNTER — Telehealth: Payer: Self-pay | Admitting: Oncology

## 2019-03-29 NOTE — Telephone Encounter (Signed)
Mal Amabile with appointment to see Dr. Denman George on 05/15/19 at 2 pm.  She verbalized understanding and agreement.

## 2019-04-05 ENCOUNTER — Ambulatory Visit: Payer: MEDICARE | Primary: Family Medicine

## 2019-04-23 ENCOUNTER — Telehealth: Payer: Self-pay | Admitting: Oncology

## 2019-04-23 MED ORDER — OLMESARTAN-HYDROCHLOROTHIAZIDE 20 MG-12.5 MG TAB
ORAL_TABLET | Freq: Every day | ORAL | 3 refills | Status: DC
Start: 2019-04-23 — End: 2019-04-25

## 2019-04-23 NOTE — Telephone Encounter (Signed)
Jessica Johnston called and asked when her next port flush will be. She said she is concerned that it has not been flushed in a while.  Advised her that her last port flush was on 03/04/19 and her next is scheduled for 05/13/19.  Discussed that this will be 10 weeks and will be OK to have her port flushed.  She verbalized understanding.

## 2019-04-25 MED ORDER — OLMESARTAN-HYDROCHLOROTHIAZIDE 20 MG-12.5 MG TAB
ORAL_TABLET | Freq: Every day | ORAL | 3 refills | Status: DC
Start: 2019-04-25 — End: 2020-03-17

## 2019-05-02 ENCOUNTER — Encounter

## 2019-05-03 MED ORDER — ERGOCALCIFEROL (VITAMIN D2) 50,000 UNIT CAP
1250 mcg (50,000 unit) | ORAL_CAPSULE | ORAL | 3 refills | Status: DC
Start: 2019-05-03 — End: 2020-05-12

## 2019-05-13 ENCOUNTER — Other Ambulatory Visit: Payer: Self-pay

## 2019-05-13 ENCOUNTER — Other Ambulatory Visit: Payer: Self-pay | Admitting: Gynecologic Oncology

## 2019-05-13 ENCOUNTER — Inpatient Hospital Stay: Payer: Medicare Other | Attending: Gynecologic Oncology

## 2019-05-13 ENCOUNTER — Inpatient Hospital Stay: Payer: Medicare Other

## 2019-05-13 DIAGNOSIS — R7301 Impaired fasting glucose: Secondary | ICD-10-CM | POA: Diagnosis not present

## 2019-05-13 DIAGNOSIS — R971 Elevated cancer antigen 125 [CA 125]: Secondary | ICD-10-CM | POA: Insufficient documentation

## 2019-05-13 DIAGNOSIS — K432 Incisional hernia without obstruction or gangrene: Secondary | ICD-10-CM | POA: Diagnosis not present

## 2019-05-13 DIAGNOSIS — E785 Hyperlipidemia, unspecified: Secondary | ICD-10-CM | POA: Diagnosis not present

## 2019-05-13 DIAGNOSIS — C5702 Malignant neoplasm of left fallopian tube: Secondary | ICD-10-CM | POA: Insufficient documentation

## 2019-05-13 DIAGNOSIS — Z90722 Acquired absence of ovaries, bilateral: Secondary | ICD-10-CM | POA: Diagnosis not present

## 2019-05-13 DIAGNOSIS — Z95828 Presence of other vascular implants and grafts: Secondary | ICD-10-CM

## 2019-05-13 DIAGNOSIS — C786 Secondary malignant neoplasm of retroperitoneum and peritoneum: Secondary | ICD-10-CM | POA: Insufficient documentation

## 2019-05-13 DIAGNOSIS — Z9221 Personal history of antineoplastic chemotherapy: Secondary | ICD-10-CM | POA: Insufficient documentation

## 2019-05-13 DIAGNOSIS — Z90710 Acquired absence of both cervix and uterus: Secondary | ICD-10-CM | POA: Insufficient documentation

## 2019-05-13 DIAGNOSIS — C569 Malignant neoplasm of unspecified ovary: Secondary | ICD-10-CM

## 2019-05-13 DIAGNOSIS — M8580 Other specified disorders of bone density and structure, unspecified site: Secondary | ICD-10-CM | POA: Diagnosis not present

## 2019-05-13 MED ORDER — ALTEPLASE 2 MG IJ SOLR
INTRAMUSCULAR | Status: AC
  Filled 2019-05-13: qty 2

## 2019-05-13 MED ORDER — SODIUM CHLORIDE 0.9% FLUSH
10.0000 mL | Freq: Once | INTRAVENOUS | Status: AC
  Administered 2019-05-13: 09:00:00 10 mL
  Filled 2019-05-13: qty 10

## 2019-05-13 MED ORDER — ALTEPLASE 2 MG IJ SOLR
2.0000 mg | Freq: Once | INTRAMUSCULAR | Status: AC
  Administered 2019-05-13: 2 mg
  Filled 2019-05-13: qty 2

## 2019-05-13 MED ORDER — HEPARIN SOD (PORK) LOCK FLUSH 100 UNIT/ML IV SOLN
500.0000 [IU] | Freq: Once | INTRAVENOUS | Status: AC
  Administered 2019-05-13: 10:00:00 500 [IU]
  Filled 2019-05-13: qty 5

## 2019-05-13 NOTE — Progress Notes (Signed)
CA125

## 2019-05-14 LAB — CA 125: Cancer Antigen (CA) 125: 230 U/mL — ABNORMAL HIGH (ref 0.0–38.1)

## 2019-05-15 ENCOUNTER — Encounter: Payer: Self-pay | Admitting: Gynecologic Oncology

## 2019-05-15 ENCOUNTER — Other Ambulatory Visit: Payer: Self-pay

## 2019-05-15 ENCOUNTER — Inpatient Hospital Stay (HOSPITAL_BASED_OUTPATIENT_CLINIC_OR_DEPARTMENT_OTHER): Payer: Medicare Other | Admitting: Gynecologic Oncology

## 2019-05-15 VITALS — BP 122/52 | HR 81 | Temp 98.0°F | Resp 18 | Ht 62.0 in | Wt 147.2 lb

## 2019-05-15 DIAGNOSIS — R1909 Other intra-abdominal and pelvic swelling, mass and lump: Secondary | ICD-10-CM

## 2019-05-15 DIAGNOSIS — R971 Elevated cancer antigen 125 [CA 125]: Secondary | ICD-10-CM | POA: Diagnosis not present

## 2019-05-15 DIAGNOSIS — Z90710 Acquired absence of both cervix and uterus: Secondary | ICD-10-CM | POA: Diagnosis not present

## 2019-05-15 DIAGNOSIS — C569 Malignant neoplasm of unspecified ovary: Secondary | ICD-10-CM | POA: Diagnosis not present

## 2019-05-15 DIAGNOSIS — Z9221 Personal history of antineoplastic chemotherapy: Secondary | ICD-10-CM | POA: Diagnosis not present

## 2019-05-15 DIAGNOSIS — Z90722 Acquired absence of ovaries, bilateral: Secondary | ICD-10-CM

## 2019-05-15 DIAGNOSIS — C786 Secondary malignant neoplasm of retroperitoneum and peritoneum: Secondary | ICD-10-CM | POA: Diagnosis not present

## 2019-05-15 DIAGNOSIS — C5702 Malignant neoplasm of left fallopian tube: Secondary | ICD-10-CM | POA: Diagnosis not present

## 2019-05-15 NOTE — Patient Instructions (Signed)
Dr Denman George is recommending CA 125 to evaluation the rise in your CA 125.  She is recommending resuming chemotherapy. One option are the drugs cisplatin with gemcitabine.  Avastin with weekly taxol is another option, or either of these drugs alone. Dr Alvy Bimler will discuss the options further with you.

## 2019-05-15 NOTE — Progress Notes (Signed)
Follow-up Note: Gyn-Onc  Consult was initially requested by Dr. Toney Rakes for the evaluation of Jessica Johnston 78 y.o. female  CC:  Chief Complaint  Patient presents with  . Ovarian Cancer    follow-up    Assessment/Plan:  Jessica Johnston  is a 78 y.o.  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 BRCA negative. ER/PR negative (ER 20%), MMR normal/MSI stable.  Recurrence (peritoneal)in June, 2019.  Second recurrence in July, 2020.   I recommend obtaining a CT abd/pelvis to document measurable lesions. Then would recommend recommencing chemotherapy. Would consider either cis/gem, weekly Taxol with bevacizumab, or single agent bevacizumab.  Asymptomatic umbilical incisional hernia. Declines surgical referral.  HPI: Jessica Johnston is a 78 year old para's woman who is seen in consultation at the request of Dr. Toney Rakes for abdominal peritoneal carcinomatosis and omental caking. The patient has a known history of a 3 cm left ovarian cyst for which she saw my partner approximately 15 months ago. At that time her CA-125 is mildly elevated to 49 in her Roma 1 score was also elevated. However the left ovarian cyst was unilocular, measured only 3 cm, and was long-standing, and therefore suspicion for occult malignancy was low.  In November 2016 she began feeling central and upper abdominal discomfort. Initially she thought it was part of the grieving process she lost her grandson. However when the pain and discomfort persisted she sought evaluation by her providers. A gynecologic evaluation was unremarkable. However eventually she underwent CT scan imaging on 01/27/2016 for this persistent midabdominal pain. CT imaging showed a liver containing a few small low-density lesions which were equivocal for the potential of metastatic disease these were subcentimeter in dimension. There was an extensive omental nodularity  which was highly worrisome for peritoneal carcinomatosis. A 3.5 cm stable left adnexal simple cyst was identified. This was not typical for ovarian cancer. There were no other obvious signs of metastatic disease including no gross ascites.  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 She is not required lymphadenectomy for any of these melanoma lesions. She is no family history significant for breast or ovarian cancer.   On 02/18/16 she underwent an exploratory laparotomy, TAH, BSO, omentectomy, radical tumor debulking and argon beam coagulation of milliary tumor implants. Disease was predominantly in the omentum with milliary studding of the ovaries and peritoneal surfaces. There was a right ovarian cyst measuring 3cm. There was no gross residual disease at completion of surgery with exception of the residual nodules that had received argon beam coagulation.  Postoperatively she did well with no complications.  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 The right ovarian cyst (that had been present in 2015) was benign.   She completed 6 cycles of adjuvant chemotherapy with carboplatin and paclitaxel on 07/15/16.   CA 125 was stable and normal at 17.2 on 08/08/16. CT abdo/pelvis on 08/05/16 showed no evidence for persistent disease.  CA 125 was stable and normal at 13.6 on 11/04/16, and 13.8 on 01/20/17.  Her sister was diagnosed with ovarian cancer in December, 2017. Jessica Johnston is BRCA negative. Her sister is as yet untested.   CA 125 on 04/13/17 was normal at 16.1.  CT abdo/pelvis on 06/20/17 showed a small supraumbilical hernia, but no other abnormalities and no evidence of metastatic or recurrent disease. CA 125 was  normal at 18.4 in September, 2018. Repeat CA 125 was normal and stable at 18.5 on 09/18/17.   [name] reported some mild abdominal bloating in April, 2019.  CA 125 on 01/23/18 had  increased from 18 to 35.4 suggestive of recurrence.  CT abd/pelvis on January 30, 2018 revealed no evidence of recurrence within the pelvis, no evidence of metastatic peritoneal disease or omental disease.  No solid organ metastases.  Krishna traveled to [place] for a vacation and was doing well with no symptoms other than vague abdominal discomfort that she was unclear was related to either cancer recurrence or her abdominal hernia.  Ca1 25 was redrawn on March 20, 2018.  It was progressively elevated at 73. She elected for expectant management as her son Jessica Johnston underwent open heart surgery for a history of valvular disease from endocarditis.  He experienced perioperative complications and was in a critical state in the ICU.  She and her family were devastated from this event. Her son pulled through but experienced a stroke and was in rehab for this.  Interval Hx:   CA 125 on 05/02/18 was 127. On 05/08/18 CT abd/pelvis showed : 7 x 6 x 6.6 cm collection of low-density soft tissue is identified in the left subdiaphragmatic space of the upper abdomen, lateral to the stomach and anterior to the spleen. This is new since 08/05/2016 and progressive since 01/30/2018. Similar 17 mm low-density soft tissue lesion posterior to the spleen is new since 01/30/2018. Small focus of abnormal soft tissue or fluid is identified along the lateral wall the cecum. Other areas of trace fluid are seen around right lower quadrant small bowel loops  She elected to proceed with salvage therapy for her recurrence commencing carboplatin and Doxil chemotherapy doublet on May 18, 2018.  Cycle 4 was administered on October 15, 2018.  Overall she tolerated this therapy fairly well with some mild symptoms of rash and oral canker sores.  Her Ca1 25 was 79 at the beginning of therapy and had fallen to a level of 20.1 on October 15, 2018.  CT abdomen pelvis was ordered on October 22, 2018 to monitor for response.  It demonstrated 2 peritoneal  implants in the left upper abdomen which was stable versus mildly decreased from 4 cm to 3 cm.  No abdominopelvic ascites.  No new implants or new or progressive metastatic disease.  Her only symptom of recurrence was vague bloating that she noticed earlier.  Interval Hx:  She proceeded with salvage therapy with carboplatin and Doxil between 05/18/18 and 12/31/18.  CA 125 was 166 when she commenced therapy and had decreased to 18.9 at the completion of therapy. CT imaging on 01/31/19 showed decrease in the size of the left upper quadrant peritoneal implants. There was no free fluid.   At that time (April, 2020) she was placed on a PARPi maintenance therapy (niraparib). However, Greenley felt fatigued and had loss of taste. She was bothered by her symptoms and therefore elected to stop maintenance therapy due to toxicity and QOL reasons in May, 2020 after 1 month.   She had been doing well with no symptoms specific to recurrence.  CA 125 on 05/13/19 was elevated at 230 reflecting new recurrence.   Current Meds:  Outpatient Encounter Medications as of 05/15/2019  Medication Sig  . Cholecalciferol (VITAMIN D) 2000 units CAPS Take 1 capsule by mouth daily.  Marland Kitchen ezetimibe (ZETIA) 10 MG tablet Take 10 mg by mouth daily.  . prochlorperazine (COMPAZINE) 10 MG tablet Take  1 tablet (10 mg total) by mouth every 6 (six) hours as needed for nausea or vomiting. (Patient not taking: Reported on 05/15/2019)   No facility-administered encounter medications on file as of 05/15/2019.     Allergy: No Known Allergies  Social Hx:   Social History   Socioeconomic History  . Marital status: Married    Spouse name: Hinton Dyer  . Number of children: 2  . Years of education: Not on file  . Highest education level: Not on file  Occupational History  . Occupation: retired housewife  Social Needs  . Financial resource strain: Not on file  . Food insecurity    Worry: Not on file    Inability: Not on file  . Transportation  needs    Medical: Not on file    Non-medical: Not on file  Tobacco Use  . Smoking status: Never Smoker  . Smokeless tobacco: Never Used  Substance and Sexual Activity  . Alcohol use: Yes    Alcohol/week: 9.0 standard drinks    Types: 9 Glasses of wine per week    Comment: 9 GLASSES OF WINE A WEEK   . Drug use: No  . Sexual activity: Not Currently    Comment: 1ST INTERCOURSE- 21, PARTNERS- 1, MARRIED- 45 YRS   Lifestyle  . Physical activity    Days per week: Not on file    Minutes per session: Not on file  . Stress: Not on file  Relationships  . Social Herbalist on phone: Not on file    Gets together: Not on file    Attends religious service: Not on file    Active member of club or organization: Not on file    Attends meetings of clubs or organizations: Not on file    Relationship status: Not on file  . Intimate partner violence    Fear of current or ex partner: Not on file    Emotionally abused: Not on file    Physically abused: Not on file    Forced sexual activity: Not on file  Other Topics Concern  . Not on file  Social History Narrative  . Not on file    Past Surgical Hx:  Past Surgical History:  Procedure Laterality Date  . ABDOMINAL HYSTERECTOMY N/A 02/18/2016   Procedure: HYSTERECTOMY ABDOMINAL;  Surgeon: Everitt Amber, MD;  Location: [facility];  Service: Gynecology;  Laterality: N/A;  . DEBULKING N/A 02/18/2016   Procedure: RADICAL TUMOR DEBULKING;  Surgeon: Everitt Amber, MD;  Location: [facility];  Service: Gynecology;  Laterality: N/A;  . HYSTEROSCOPY    . LAPAROTOMY N/A 02/18/2016   Procedure: EXPLORATORY LAPAROTOMY;  Surgeon: Everitt Amber, MD;  Location: [facility];  Service: Gynecology;  Laterality: N/A;  . omental mass     Biopsy, CT guided  . OMENTECTOMY N/A 02/18/2016   Procedure: OMENTECTOMY;  Surgeon: Everitt Amber, MD;  Location: [facility];  Service: Gynecology;  Laterality: N/A;  . SALPINGOOPHORECTOMY Bilateral 02/18/2016   Procedure: BILATERAL SALPINGO OOPHORECTOMY;   Surgeon: Everitt Amber, MD;  Location: [facility];  Service: Gynecology;  Laterality: Bilateral;  . TONSILLECTOMY AND ADENOIDECTOMY  1948    Past Medical Hx:  Past Medical History:  Diagnosis Date  . Arthritis    arthritis -hands-knees.  . Cancer ([place])    MELANOMA. multiple x5 different sites. last 10 yrs ago.  Marland Kitchen Headache    past hx.-no in awhile.  . High cholesterol   . Melanoma ([place])   . Osteopenia   .  Skin cancer    melanoma    Past Gynecological History:  SVD x 2  No LMP recorded. Patient has had a hysterectomy.  Family Hx:  Family History  Problem Relation Age of Onset  . Colitis Sister   . Cancer Sister 70       ovarian ca  . Parkinsonism Maternal Grandmother   . Heart disease Paternal Grandmother   . Heart disease Paternal Grandfather   . Cancer Cousin 12       paternal first cousin  . Skin cancer Son     Review of Systems:  Constitutional  Feels well,    ENT Normal appearing ears and nares bilaterally Skin/Breast  No rash, sores, jaundice, itching, dryness Cardiovascular  No chest pain, shortness of breath, or edema  Pulmonary  No cough or wheeze.  Gastro Intestinal  No abdominal pain or difficulty eating. Vague abdominal discomfort. Genito Urinary  No frequency, urgency, dysuria, no bleeding Musculo Skeletal  No myalgia, arthralgia, joint swelling or pain  Neurologic  No weakness, numbness, change in gait,  Psychology  No depression, anxiety, insomnia.   Vitals:  Blood pressure (!) 122/52, pulse 81, temperature 98 F (36.7 C), temperature source Oral, resp. rate 18, height '5\' 2"'$  (1.575 m), weight 147 lb 3.2 oz (66.8 kg), SpO2 98 %.  Physical Exam: WD in NAD Neck  Supple NROM, without any enlargements.  Lymph Node Survey No cervical supraclavicular or inguinal adenopathy Cardiovascular  Pulse normal rate, regularity and rhythm. S1 and S2 normal.  Lungs  Clear to auscultation bilateraly, without wheezes/crackles/rhonchi. Good air movement.   Skin  No rash/lesions/breakdown  Psychiatry  Alert and oriented to person, place, and time  Abdomen  Normoactive bowel sounds, abdomen soft, non-tender and mildly overweight (BMI 26) with soft reducible ventral hernia at level of umbilicus. Well healed incision. No palpable masses. No distension in the abdomen.  Back No CVA tenderness Genito Urinary  deferred Rectal  No posterior pelvic masses  Extremities  No bilateral cyanosis, clubbing or edema.  Thereasa Solo, MD  05/15/2019, 10:31 PM

## 2019-05-16 ENCOUNTER — Other Ambulatory Visit: Payer: Self-pay | Admitting: Hematology and Oncology

## 2019-05-16 ENCOUNTER — Telehealth: Payer: Self-pay | Admitting: Oncology

## 2019-05-16 DIAGNOSIS — C5700 Malignant neoplasm of unspecified fallopian tube: Secondary | ICD-10-CM

## 2019-05-16 NOTE — Telephone Encounter (Signed)
[facility] and advised her of appointment on 05/23/19 at 11:15 with Dr. Alvy Bimler.  She verbalized agreement and understanding.

## 2019-05-22 ENCOUNTER — Other Ambulatory Visit: Payer: Self-pay

## 2019-05-22 ENCOUNTER — Inpatient Hospital Stay: Payer: Medicare Other | Attending: Gynecologic Oncology

## 2019-05-22 ENCOUNTER — Ambulatory Visit (HOSPITAL_COMMUNITY)
Admission: RE | Admit: 2019-05-22 | Discharge: 2019-05-22 | Disposition: A | Discharge status: Home / Self Care | Payer: Medicare Other | Source: Ambulatory Visit | Attending: Gynecologic Oncology | Admitting: Gynecologic Oncology

## 2019-05-22 DIAGNOSIS — Z5112 Encounter for antineoplastic immunotherapy: Secondary | ICD-10-CM | POA: Diagnosis not present

## 2019-05-22 DIAGNOSIS — Z452 Encounter for adjustment and management of vascular access device: Secondary | ICD-10-CM | POA: Insufficient documentation

## 2019-05-22 DIAGNOSIS — R1909 Other intra-abdominal and pelvic swelling, mass and lump: Secondary | ICD-10-CM | POA: Insufficient documentation

## 2019-05-22 DIAGNOSIS — C569 Malignant neoplasm of unspecified ovary: Secondary | ICD-10-CM

## 2019-05-22 DIAGNOSIS — K5730 Diverticulosis of large intestine without perforation or abscess without bleeding: Secondary | ICD-10-CM | POA: Diagnosis not present

## 2019-05-22 DIAGNOSIS — C5700 Malignant neoplasm of unspecified fallopian tube: Secondary | ICD-10-CM

## 2019-05-22 DIAGNOSIS — Z5111 Encounter for antineoplastic chemotherapy: Secondary | ICD-10-CM | POA: Insufficient documentation

## 2019-05-22 DIAGNOSIS — C561 Malignant neoplasm of right ovary: Secondary | ICD-10-CM | POA: Diagnosis not present

## 2019-05-22 DIAGNOSIS — C5702 Malignant neoplasm of left fallopian tube: Secondary | ICD-10-CM | POA: Diagnosis not present

## 2019-05-22 DIAGNOSIS — K429 Umbilical hernia without obstruction or gangrene: Secondary | ICD-10-CM | POA: Diagnosis not present

## 2019-05-22 LAB — CBC WITH DIFFERENTIAL/PLATELET
Abs Immature Granulocytes: 0.01 10*3/uL (ref 0.00–0.07)
Basophils Absolute: 0 10*3/uL (ref 0.0–0.1)
Basophils Relative: 1 %
Eosinophils Absolute: 0.1 10*3/uL (ref 0.0–0.5)
Eosinophils Relative: 1 %
HCT: 40 % (ref 36.0–46.0)
Hemoglobin: 13.1 g/dL (ref 12.0–15.0)
Immature Granulocytes: 0 %
Lymphocytes Relative: 23 %
Lymphs Abs: 0.9 10*3/uL (ref 0.7–4.0)
MCH: 31.3 pg (ref 26.0–34.0)
MCHC: 32.8 g/dL (ref 30.0–36.0)
MCV: 95.5 fL (ref 80.0–100.0)
Monocytes Absolute: 0.5 10*3/uL (ref 0.1–1.0)
Monocytes Relative: 13 %
Neutro Abs: 2.5 10*3/uL (ref 1.7–7.7)
Neutrophils Relative %: 62 %
Platelets: 212 10*3/uL (ref 150–400)
RBC: 4.19 MIL/uL (ref 3.87–5.11)
RDW: 12.1 % (ref 11.5–15.5)
WBC: 4 10*3/uL (ref 4.0–10.5)
nRBC: 0 % (ref 0.0–0.2)

## 2019-05-22 LAB — CMP (CANCER CENTER ONLY)
ALT: 27 U/L (ref 0–44)
AST: 23 U/L (ref 15–41)
Albumin: 4 g/dL (ref 3.5–5.0)
Alkaline Phosphatase: 89 U/L (ref 38–126)
Anion gap: 12 (ref 5–15)
BUN: 14 mg/dL (ref 8–23)
CO2: 24 mmol/L (ref 22–32)
Calcium: 9.7 mg/dL (ref 8.9–10.3)
Chloride: 105 mmol/L (ref 98–111)
Creatinine: 0.88 mg/dL (ref 0.44–1.00)
GFR, Est AFR Am: >60 mL/min (ref >60)
GFR, Estimated: >60 mL/min (ref >60)
Glucose, Bld: 94 mg/dL (ref 70–99)
Potassium: 4.5 mmol/L (ref 3.5–5.1)
Sodium: 141 mmol/L (ref 135–145)
Total Bilirubin: 0.2 mg/dL — ABNORMAL LOW (ref 0.3–1.2)
Total Protein: 7.3 g/dL (ref 6.5–8.1)

## 2019-05-22 MED ORDER — SODIUM CHLORIDE (PF) 0.9 % IJ SOLN
INTRAMUSCULAR | Status: AC
  Filled 2019-05-22: qty 50

## 2019-05-22 MED ORDER — IOHEXOL 300 MG/ML  SOLN
100.0000 mL | Freq: Once | INTRAMUSCULAR | Status: AC | PRN
  Administered 2019-05-22: 100 mL via INTRAVENOUS

## 2019-05-23 ENCOUNTER — Other Ambulatory Visit: Payer: Self-pay

## 2019-05-23 ENCOUNTER — Inpatient Hospital Stay (HOSPITAL_BASED_OUTPATIENT_CLINIC_OR_DEPARTMENT_OTHER): Payer: Medicare Other | Admitting: Hematology and Oncology

## 2019-05-23 VITALS — BP 134/72 | HR 66 | Temp 98.5°F | Resp 18 | Ht 62.0 in | Wt 147.4 lb

## 2019-05-23 DIAGNOSIS — R030 Elevated blood-pressure reading, without diagnosis of hypertension: Secondary | ICD-10-CM | POA: Diagnosis not present

## 2019-05-23 DIAGNOSIS — C5702 Malignant neoplasm of left fallopian tube: Secondary | ICD-10-CM

## 2019-05-23 DIAGNOSIS — Z5112 Encounter for antineoplastic immunotherapy: Secondary | ICD-10-CM | POA: Diagnosis not present

## 2019-05-23 DIAGNOSIS — Z7189 Other specified counseling: Secondary | ICD-10-CM | POA: Diagnosis not present

## 2019-05-23 DIAGNOSIS — Z452 Encounter for adjustment and management of vascular access device: Secondary | ICD-10-CM | POA: Diagnosis not present

## 2019-05-23 DIAGNOSIS — C5700 Malignant neoplasm of unspecified fallopian tube: Secondary | ICD-10-CM

## 2019-05-23 DIAGNOSIS — Z5111 Encounter for antineoplastic chemotherapy: Secondary | ICD-10-CM | POA: Diagnosis not present

## 2019-05-24 ENCOUNTER — Encounter: Payer: Self-pay | Admitting: Hematology and Oncology

## 2019-05-24 ENCOUNTER — Other Ambulatory Visit: Payer: Self-pay | Admitting: Hematology and Oncology

## 2019-05-24 DIAGNOSIS — C5700 Malignant neoplasm of unspecified fallopian tube: Secondary | ICD-10-CM

## 2019-05-24 MED ORDER — ONDANSETRON HCL 8 MG PO TABS: 8.0000 mg | ORAL_TABLET | Freq: Two times a day (BID) | ORAL | 1 refills | Status: DC | PRN

## 2019-05-24 MED ORDER — LIDOCAINE-PRILOCAINE 2.5-2.5 % EX CREA: TOPICAL_CREAM | CUTANEOUS | 3 refills | Status: DC

## 2019-05-24 MED ORDER — PROCHLORPERAZINE MALEATE 10 MG PO TABS: 10.0000 mg | ORAL_TABLET | Freq: Four times a day (QID) | ORAL | 1 refills | Status: DC | PRN

## 2019-05-24 NOTE — Progress Notes (Signed)
[place] OFFICE PROGRESS NOTE  Patient Care Team: [name], MD as PCP - General (Internal Medicine)  ASSESSMENT & PLAN:  Fallopian tube cancer, carcinoma ([place]) I have reviewed imaging study with the patient extensively We reviewed the guidelines and plan of care Ultimately, after much discussion about the risks, benefits, side effects of treatment she agree with treatment with Taxol and bevacizumab. Some of the risks discussed including pancytopenia, risk of infection, peripheral neuropathy, hair loss, hypertension, blood clot, risk of bowel obstruction, hospitalization and death I will proceed to schedule her chemotherapy next week  Elevated BP without diagnosis of hypertension She is aware about risk of hypertension with treatment I advised her to check her blood pressure twice a day and will call me if systolic blood pressures greater than 654 or diastolic blood pressure greater than 95  Goals of care, counseling/discussion We had numerous goals of care discussion in the past Her son, Hinton Dyer, is her healthcare power of attorney She does not wish to be resuscitated in the event of terminal illness She understood that the goals of care is strictly palliative in nature   Orders Placed This Encounter  Procedures  . CBC with Differential (Cancer Center Only)    Standing Status:   Standing    Number of Occurrences:   20    Standing Expiration Date:   05/23/2020  . CMP ([facility] only)    Standing Status:   Standing    Number of Occurrences:   20    Standing Expiration Date:   05/23/2020  . Total Protein, Urine dipstick    Standing Status:   Standing    Number of Occurrences:   9    Standing Expiration Date:   05/23/2020    INTERVAL HISTORY: Please see below for problem oriented charting. 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 Extensive omental nodularity highly worrisome for peritoneal carcinomatosis. Late recurrence of melanoma can present as peritoneal carcinomatosis. Ovarian cancer more commonly presents in this manner. Patient does have a predominately low density right adnexal lesion measuring up to 3.5 cm, although this lesion is not typical for ovarian cancer. 2. No evidence of bowel or ureteral obstruction. 3. No other definite signs of metastatic disease. There are small indeterminate low-density hepatic lesions.   02/08/2016 Tumor Marker   Patient's tumor was tested for the following markers: CA-125 Results of the tumor marker test revealed 656.2   02/15/2016 Procedure   CT-guided core biopsy performed of omental mass just deep to the abdominal wall.   02/15/2016 Pathology Results   Omentum, biopsy, left - PAPILLARY SEROUS NEOPLASM, SEE COMMENT. Microscopic Comment There are papillary collections of largely low grade appearing cells with numerous psammoma bodies. Given the limited material it is difficult to distinguish between invasive implants of a serous borderline tumor and serous carcinoma, especially given the low grade appearance.   02/18/2016 Pathology Results   1. Omentum,  resection for tumor INVASIVE IMPLANT OF HIGH GRADE SEROUS CARCINOMA 2. Uterus +/- tubes/ovaries, neoplastic HIGH GRADE SEROUS CARCINOMA INVOLVING LEFT TUBAL FIMBRIA SEROUS CARCINOMA WITH PREDOMINANT PSAMMOMA BODIES IMPLANT AT UTERUS SEROSA, BILATERAL FALLOPIAN TUBAL SEROSA, AND  ANTERIOR PERITONEAL REFLECTION CERVIX: HISTOLOGICAL UNREMARKABLE ENDOMETRIUM: INACTIVE ENDOMETRIUM MYOMETRIUM: LEIOMYOMA LEFT OVARY: CYSTADENOFIBROMA RIGHT OVARY AND FALLOPIAN TUBE: HISTOLOGICAL UNREMARKABLE Microscopic Comment 2. ONCOLOGY TABLE - FALLOPIAN TUBE 1. Specimen, including laterality: Omentum, uterus, bilateral ovaries and fallopian tubes 2. Procedure: Hysterectomy, bilateral salpingo-oophorectomy and tumor debulking omentectomy 3. Lymph node sampling performed: No 4. Tumor site: uterus serosa, bilateral fallopian tubal serosa, peritoneal and omentum 5. Tumor location in fallopian tube: Left fallopian tubal fimbria 6. Specimen integrity (intact/ruptured/disrupted): Intact 7. Tumor size (cm): multi focal invasive omentum implant greater than 2 cm, left fallopian tube tumor 0.8 cm. 8. Histologic type: Serous carcinoma 9. Grade: 3 10. Microscopic tumor extension: uterus serosa, bilateral fallopian tubal serosa, peritoneal and omentum 11. Margins: NA 12. Lymph-Vascular invasion: identified 13. Lymph nodes: # examined: 0; # positive: NA 14. TNM: pT3c, pNx 15. FIGO Stage (based on pathologic findings, needs clinical correlation: IIIC 16. Comment: High grade serous carcinoma multifocally and extensively involves left fallopian tubal fimbria, omentum, uterus serosa, bilateral fallopian tubal serosa and peritoneal. At the left fallopian tube there are small foci of serous tubal intraepithelial carcinoma identified, so we conclude the carcinoma is fallopian tube origin   03/09/2016 Tumor Marker   Patient's tumor was tested for the following markers: CA-125 Results of the tumor marker test revealed 154.4   03/15/2016 Procedure   Technically successful right IJ power-injectable port catheter placement. Ready for routine use.   03/18/2016 - 07/13/2016 Chemotherapy   The patient had 6 cycles of carboplatin and taxol   04/14/2016 Tumor Marker   Patient's tumor was tested for the following  markers: CA-125 Results of the tumor marker test revealed 36.7   04/25/2016 Genetic Testing   Patient has genetic testing done for germline mutation Results revealed patient has no mutation   04/28/2016 Tumor Marker   Patient's tumor was tested for the following markers: CA-125 Results of the tumor marker test revealed 24.6   06/21/2016 Tumor Marker   Patient's tumor was tested for the following markers: CA-125 Results of the tumor marker test revealed 16.6   08/01/2016 Tumor Marker   Patient's tumor was tested for the following markers: CA-125 Results of the tumor marker test revealed 17.2   08/05/2016 Imaging   Interval TAH-BSO. No evidence of residual pelvic mass or metastatic disease within the abdomen or pelvis. No other acute findings.    11/04/2016 Tumor Marker   Patient's tumor was tested for the following markers: CA-125 Results of the tumor marker test revealed 13.6   01/20/2017 Tumor Marker   Patient's tumor was tested for the following markers: CA-125 Results of the tumor marker test revealed 13.8   04/14/2017 Tumor Marker   Patient's tumor was tested for the following markers: CA-125 Results of the tumor marker test revealed 16.1   06/13/2017 Imaging   No acute findings.  No mass or hernia identified.  Colonic diverticulosis, without radiographic evidence of diverticulitis.  Aortic atherosclerosis.   07/03/2017 Tumor Marker   Patient's tumor was tested for the following markers: CA-125 Results of the tumor marker test revealed 18.4   09/19/2017 Tumor Marker   Patient's tumor was tested for the following markers: CA-125 Results of the tumor marker test revealed 18.5   01/23/2018 Tumor Marker   Patient's tumor was tested  for the following markers: CA-125 Results of the tumor marker test revealed 35.4   01/30/2018 Imaging   1. No evidence of local fallopian tube carcinoma recurrence within the pelvis. Post hysterectomy. 2. No evidence of metastatic peritoneal  disease or omental disease. No solid organ metastasis.    03/20/2018 Tumor Marker   Patient's tumor was tested for the following markers: CA-125 Results of the tumor marker test revealed 73   05/02/2018 Tumor Marker   Patient's tumor was tested for the following markers: CA-125 Results of the tumor marker test revealed 127.1   05/08/2018 Imaging   CT abdomen and pelvis 1. 7 cm left subdiaphragmatic collection of loculated fluid versus low-density soft tissue. Given the patient's history of rising CA 125, metastatic disease considered highly likely.  2. Areas of fluid or recurrent disease identified in the right lower quadrant adjacent to the cecum and along right lower quadrant small bowel loops.   05/14/2018 Tumor Marker   Patient's tumor was tested for the following markers: CA-125 Results of the tumor marker test revealed 166    Genetic Testing   Patient has genetic testing done for ER/PR and MMR. Results revealed patient has the following on 02/18/2016 surgical pathology: ER 20%, PR 0% MMR: normal    Genetic Testing   Patient has genetic testing done for MSI. Results revealed patient has the following: MSI stable   05/18/2018 - 12/31/2018 Chemotherapy   The patient had carboplatin and Doxil   07/16/2018 Tumor Marker   Patient's tumor was tested for the following markers: CA-125 Results of the tumor marker test revealed 28.7   08/10/2018 Imaging   CT imaging:  Decreased size of peritoneal soft tissue masses in left upper quadrant, consistent with decreased metastatic disease.  No new or progressive metastatic disease. No other acute findings.  Colonic diverticulosis, without radiographic evidence of diverticulitis.  Stable small paraumbilical ventral hernia containing transverse colon.   08/13/2018 Tumor Marker   Patient's tumor was tested for the following markers: CA-125 Results of the tumor marker test revealed 21.6   08/24/2018 Echocardiogram   LV EF: 60% -   65%   10/15/2018 Tumor Marker   Patient's tumor was tested for the following markers: CA-125 Results of the tumor marker test revealed 20.1   10/19/2018 Imaging   Ct scan of abdomen and pelvis Status post hysterectomy, bilateral salpingo-oophorectomy, and omentectomy.  Two peritoneal implants in the left upper abdomen are stable versus mildly decreased, as above. No abdominopelvic ascites.  No evidence of new/progressive metastatic disease.     11/19/2018 Tumor Marker   Patient's tumor was tested for the following markers: CA-125 Results of the tumor marker test revealed 21.7   11/23/2018 Echocardiogram   1. The left ventricle has normal systolic function of 82-99%. The cavity size was normal. There is no increased left ventricular wall thickness. Echo evidence of impaired diastolic relaxation.  2. The right ventricle has normal systolic function. The cavity was normal. There is no increase in right ventricular wall thickness.  3. The mitral valve is normal in structure. There is mild mitral annular calcification present.  4. The tricuspid valve is normal in structure.  5. The aortic valve is tricuspid There is mild thickening of the aortic valve.  6. The pulmonic valve was normal in structure. Pulmonic valve regurgitation is mild by color flow Doppler.  7. Normal LV systolic function; mild diastolic dysfunction.   12/31/2018 Tumor Marker   Patient's tumor was tested for the following markers: CA-125  Results of the tumor marker test revealed 17.1   01/31/2019 Imaging   1. Left upper quadrant peritoneal implants described previously have decreased in the interval. No new or progressive findings in the abdomen/pelvis today. No free fluid. 2. Right paraumbilical ventral hernia contains a small knuckle of transverse colon without complicating features. 3.  Aortic Atherosclerois (ICD10-170.0)  Aortic Atherosclerosis (ICD10-I70.0).   01/31/2019 Tumor Marker   Patient's tumor was  tested for the following markers: CA-125 Results of the tumor marker test revealed 18.9   02/11/2019 - 03/13/2019 Chemotherapy   The patient is taking Niraparib. She self discontinued after 1 month   02/25/2019 Tumor Marker   Patient's tumor was tested for the following markers: CA-125 Results of the tumor marker test revealed 18.6   05/13/2019 Tumor Marker   Patient's tumor was tested for the following markers: CA-125 Results of the tumor marker test revealed 230   05/22/2019 Imaging   1. Increased size of 4.6 cm soft tissue mass in the gastrosplenic ligament, consistent with metastatic disease. 2. No other sites of metastatic disease identified within the abdomen or pelvis. 3. Colonic diverticulosis. No radiographic evidence of diverticulitis. 4. Stable small right paraumbilical hernia containing transverse colon.   Aortic Atherosclerosis (ICD10-I70.0).   05/31/2019 -  Chemotherapy   The patient had bevacizumab (AVASTIN) 675 mg in sodium chloride 0.9 % 100 mL chemo infusion, 10 mg/kg = 675 mg, Intravenous,  Once, 0 of 3 cycles PACLitaxel (TAXOL) 138 mg in sodium chloride 0.9 % 250 mL chemo infusion (</= '80mg'$ /m2), 80 mg/m2 = 138 mg, Intravenous,  Once, 0 of 3 cycles  for chemotherapy treatment.      REVIEW OF SYSTEMS:   Constitutional: Denies fevers, chills or abnormal weight loss Eyes: Denies blurriness of vision Ears, nose, mouth, throat, and face: Denies mucositis or sore throat Respiratory: Denies cough, dyspnea or wheezes Cardiovascular: Denies palpitation, chest discomfort or lower extremity swelling Gastrointestinal:  Denies nausea, heartburn or change in bowel habits Skin: Denies abnormal skin rashes Lymphatics: Denies new lymphadenopathy or easy bruising Neurological:Denies numbness, tingling or new weaknesses Behavioral/Psych: Mood is stable, no new changes  All other systems were reviewed with the patient and are negative.  I have reviewed the past medical history, past  surgical history, social history and family history with the patient and they are unchanged from previous note.  ALLERGIES:  has No Known Allergies.  MEDICATIONS:  Current Outpatient Medications  Medication Sig Dispense Refill  . Cholecalciferol (VITAMIN D) 2000 units CAPS Take 1 capsule by mouth daily.    Marland Kitchen ezetimibe (ZETIA) 10 MG tablet Take 10 mg by mouth daily.    Marland Kitchen lidocaine-prilocaine (EMLA) cream Apply to affected area once 30 g 3  . ondansetron (ZOFRAN) 8 MG tablet Take 1 tablet (8 mg total) by mouth 2 (two) times daily as needed (Nausea or vomiting). 30 tablet 1  . prochlorperazine (COMPAZINE) 10 MG tablet Take 1 tablet (10 mg total) by mouth every 6 (six) hours as needed for nausea or vomiting. (Patient not taking: Reported on 05/15/2019) 30 tablet 1  . prochlorperazine (COMPAZINE) 10 MG tablet Take 1 tablet (10 mg total) by mouth every 6 (six) hours as needed (Nausea or vomiting). 30 tablet 1   No current facility-administered medications for this visit.     PHYSICAL EXAMINATION: ECOG PERFORMANCE STATUS: 1 - Symptomatic but completely ambulatory  Vitals:   05/23/19 1204  BP: 134/72  Pulse: 66  Resp: 18  Temp: 98.5 F (36.9 C)  SpO2:  100%   Filed Weights   05/23/19 1204  Weight: 147 lb 6.4 oz (66.9 kg)    GENERAL:alert, no distress and comfortable Musculoskeletal:no cyanosis of digits and no clubbing  NEURO: alert & oriented x 3 with fluent speech, no focal motor/sensory deficits  LABORATORY DATA:  I have reviewed the data as listed    Component Value Date/Time   NA 141 05/22/2019 1131   NA 139 06/07/2017 1430   K 4.5 05/22/2019 1131   K 3.8 06/07/2017 1430   CL 105 05/22/2019 1131   CO2 24 05/22/2019 1131   CO2 27 06/07/2017 1430   GLUCOSE 94 05/22/2019 1131   GLUCOSE 124 06/07/2017 1430   BUN 14 05/22/2019 1131   BUN 14.0 06/07/2017 1430   CREATININE 0.88 05/22/2019 1131   CREATININE 0.8 06/07/2017 1430   CALCIUM 9.7 05/22/2019 1131   CALCIUM 9.3  06/07/2017 1430   PROT 7.3 05/22/2019 1131   PROT 6.6 09/30/2016 1027   ALBUMIN 4.0 05/22/2019 1131   ALBUMIN 3.5 09/30/2016 1027   AST 23 05/22/2019 1131   AST 18 09/30/2016 1027   ALT 27 05/22/2019 1131   ALT 21 09/30/2016 1027   ALKPHOS 89 05/22/2019 1131   ALKPHOS 77 09/30/2016 1027   BILITOT 0.2 (L) 05/22/2019 1131   BILITOT 0.37 09/30/2016 1027   GFRNONAA >60 05/22/2019 1131   GFRAA >60 05/22/2019 1131    No results found for: SPEP, UPEP  Lab Results  Component Value Date   WBC 4.0 05/22/2019   NEUTROABS 2.5 05/22/2019   HGB 13.1 05/22/2019   HCT 40.0 05/22/2019   MCV 95.5 05/22/2019   PLT 212 05/22/2019      Chemistry      Component Value Date/Time   NA 141 05/22/2019 1131   NA 139 06/07/2017 1430   K 4.5 05/22/2019 1131   K 3.8 06/07/2017 1430   CL 105 05/22/2019 1131   CO2 24 05/22/2019 1131   CO2 27 06/07/2017 1430   BUN 14 05/22/2019 1131   BUN 14.0 06/07/2017 1430   CREATININE 0.88 05/22/2019 1131   CREATININE 0.8 06/07/2017 1430      Component Value Date/Time   CALCIUM 9.7 05/22/2019 1131   CALCIUM 9.3 06/07/2017 1430   ALKPHOS 89 05/22/2019 1131   ALKPHOS 77 09/30/2016 1027   AST 23 05/22/2019 1131   AST 18 09/30/2016 1027   ALT 27 05/22/2019 1131   ALT 21 09/30/2016 1027   BILITOT 0.2 (L) 05/22/2019 1131   BILITOT 0.37 09/30/2016 1027       RADIOGRAPHIC STUDIES: I have reviewed multiple imaging studies with the patient I have personally reviewed the radiological images as listed and agreed with the findings in the report. Ct Abdomen Pelvis W Contrast  Result Date: 05/22/2019 CLINICAL DATA:  Follow-up metastatic fallopian tube carcinoma. Recently completed chemotherapy. EXAM: CT ABDOMEN AND PELVIS WITH CONTRAST TECHNIQUE: Multidetector CT imaging of the abdomen and pelvis was performed using the standard protocol following bolus administration of intravenous contrast. CONTRAST:  160m OMNIPAQUE IOHEXOL 300 MG/ML  SOLN COMPARISON:  01/31/2019  FINDINGS: Lower Chest: No acute findings. Hepatobiliary: No hepatic masses identified. Gallbladder is unremarkable. Pancreas:  No mass or inflammatory changes. Spleen: Within normal limits in size and appearance. Adrenals/Urinary Tract: No masses identified. No evidence of hydronephrosis. Stomach/Bowel: No evidence of obstruction, inflammatory process or abnormal fluid collections. Diverticulosis is seen mainly involving the sigmoid colon, however there is no evidence of diverticulitis. A small right paraumbilical hernia is again  seen containing a loop of transverse colon. No evidence of bowel strangulation or obstruction. Vascular/Lymphatic: No pathologically enlarged lymph nodes. No abdominal aortic aneurysm. Aortic atherosclerosis. Reproductive: Prior hysterectomy noted. Adnexal regions are unremarkable in appearance. Other: Left upper quadrant soft tissue mass in the gastrosplenic ligament measures 4.6 x 3.9 cm, compared to 3.1 x 2.5 cm previously. No other peritoneal soft tissue nodules or ascites identified. Musculoskeletal:  No suspicious bone lesions identified. IMPRESSION: 1. Increased size of 4.6 cm soft tissue mass in the gastrosplenic ligament, consistent with metastatic disease. 2. No other sites of metastatic disease identified within the abdomen or pelvis. 3. Colonic diverticulosis. No radiographic evidence of diverticulitis. 4. Stable small right paraumbilical hernia containing transverse colon. Aortic Atherosclerosis (ICD10-I70.0). Electronically Signed   By: Marlaine Hind M.D.   On: 05/22/2019 14:41    All questions were answered. The patient knows to call the clinic with any problems, questions or concerns. No barriers to learning was detected.  I spent 30 minutes counseling the patient face to face. The total time spent in the appointment was 40 minutes and more than 50% was on counseling and review of test results  [name], MD 05/24/2019 2:42 PM

## 2019-05-24 NOTE — Assessment & Plan Note (Signed)
We had numerous goals of care discussion in the past Her son, Hinton Dyer, is her healthcare power of attorney She does not wish to be resuscitated in the event of terminal illness She understood that the goals of care is strictly palliative in nature

## 2019-05-24 NOTE — Progress Notes (Signed)
OFF PATHWAY REGIMEN - Ovarian  No Change  Continue With Treatment as Ordered.   [id number]:Liposomal Doxorubicin (Doxil) 30 mg/m2 + Carboplatin AUC=5 q28 Days:   A cycle is every 28 days:     Liposomal doxorubicin      Carboplatin   **Always confirm dose/schedule in your pharmacy ordering system**  Patient Characteristics: Recurrent or Progressive Disease, Second Line Therapy, Platinum Sensitive and ? 6 Months Since Last Therapy Therapeutic Status: Recurrent or Progressive Disease BRCA Mutation Status: Did Not Order Test Line of Therapy: Second Line  Intent of Therapy: Non-Curative / Palliative Intent, Discussed with Patient

## 2019-05-24 NOTE — Progress Notes (Signed)
DISCONTINUE OFF PATHWAY REGIMEN - Ovarian   [id number]:Liposomal Doxorubicin (Doxil) 30 mg/m2 + Carboplatin AUC=5 q28 Days:   A cycle is every 28 days:     Liposomal doxorubicin      Carboplatin   **Always confirm dose/schedule in your pharmacy ordering system**  REASON: Disease Progression PRIOR TREATMENT: Off Pathway: Liposomal Doxorubicin (Doxil) 30 mg/m2 + Carboplatin AUC=5 q28 Days TREATMENT RESPONSE: Progressive Disease (PD)  START ON PATHWAY REGIMEN - Ovarian     A cycle is every 28 days:     Bevacizumab-xxxx      Paclitaxel   **Always confirm dose/schedule in your pharmacy ordering system**  Patient Characteristics: Recurrent or Progressive Disease, Third Line Therapy, Platinum Resistant or < 6 Months Since Last Platinum Therapy Therapeutic Status: Recurrent or Progressive Disease BRCA Mutation Status: Absent Line of Therapy: Third Line  Intent of Therapy: Non-Curative / Palliative Intent, Discussed with Patient

## 2019-05-24 NOTE — Assessment & Plan Note (Signed)
I have reviewed imaging study with the patient extensively We reviewed the guidelines and plan of care Ultimately, after much discussion about the risks, benefits, side effects of treatment she agree with treatment with Taxol and bevacizumab. Some of the risks discussed including pancytopenia, risk of infection, peripheral neuropathy, hair loss, hypertension, blood clot, risk of bowel obstruction, hospitalization and death I will proceed to schedule her chemotherapy next week

## 2019-05-24 NOTE — Assessment & Plan Note (Signed)
She is aware about risk of hypertension with treatment I advised her to check her blood pressure twice a day and will call me if systolic blood pressures greater than 300 or diastolic blood pressure greater than 95

## 2019-05-27 ENCOUNTER — Telehealth: Payer: Self-pay | Admitting: Hematology and Oncology

## 2019-05-27 NOTE — Telephone Encounter (Signed)
Scheduled appt per 8/07 sch message - pt is aware of appts added.

## 2019-05-30 DIAGNOSIS — B353 Tinea pedis: Secondary | ICD-10-CM | POA: Diagnosis not present

## 2019-05-30 DIAGNOSIS — Z85820 Personal history of malignant melanoma of skin: Secondary | ICD-10-CM | POA: Diagnosis not present

## 2019-05-30 DIAGNOSIS — L570 Actinic keratosis: Secondary | ICD-10-CM | POA: Diagnosis not present

## 2019-05-30 DIAGNOSIS — Z08 Encounter for follow-up examination after completed treatment for malignant neoplasm: Secondary | ICD-10-CM | POA: Diagnosis not present

## 2019-05-31 ENCOUNTER — Inpatient Hospital Stay: Payer: Medicare Other

## 2019-05-31 ENCOUNTER — Other Ambulatory Visit: Payer: Self-pay

## 2019-05-31 VITALS — BP 110/60 | HR 62 | Temp 98.0°F | Resp 18

## 2019-05-31 DIAGNOSIS — Z5111 Encounter for antineoplastic chemotherapy: Secondary | ICD-10-CM | POA: Diagnosis not present

## 2019-05-31 DIAGNOSIS — C5702 Malignant neoplasm of left fallopian tube: Secondary | ICD-10-CM

## 2019-05-31 DIAGNOSIS — Z7189 Other specified counseling: Secondary | ICD-10-CM

## 2019-05-31 DIAGNOSIS — Z5112 Encounter for antineoplastic immunotherapy: Secondary | ICD-10-CM | POA: Diagnosis not present

## 2019-05-31 DIAGNOSIS — Z452 Encounter for adjustment and management of vascular access device: Secondary | ICD-10-CM | POA: Diagnosis not present

## 2019-05-31 DIAGNOSIS — C5700 Malignant neoplasm of unspecified fallopian tube: Secondary | ICD-10-CM

## 2019-05-31 DIAGNOSIS — Z95828 Presence of other vascular implants and grafts: Secondary | ICD-10-CM

## 2019-05-31 LAB — CBC WITH DIFFERENTIAL (CANCER CENTER ONLY)
Abs Immature Granulocytes: 0.01 10*3/uL (ref 0.00–0.07)
Basophils Absolute: 0 10*3/uL (ref 0.0–0.1)
Basophils Relative: 0 %
Eosinophils Absolute: 0.1 10*3/uL (ref 0.0–0.5)
Eosinophils Relative: 1 %
HCT: 38.1 % (ref 36.0–46.0)
Hemoglobin: 12.6 g/dL (ref 12.0–15.0)
Immature Granulocytes: 0 %
Lymphocytes Relative: 27 %
Lymphs Abs: 1.2 10*3/uL (ref 0.7–4.0)
MCH: 31 pg (ref 26.0–34.0)
MCHC: 33.1 g/dL (ref 30.0–36.0)
MCV: 93.8 fL (ref 80.0–100.0)
Monocytes Absolute: 0.5 10*3/uL (ref 0.1–1.0)
Monocytes Relative: 11 %
Neutro Abs: 2.7 10*3/uL (ref 1.7–7.7)
Neutrophils Relative %: 61 %
Platelet Count: 217 10*3/uL (ref 150–400)
RBC: 4.06 MIL/uL (ref 3.87–5.11)
RDW: 11.9 % (ref 11.5–15.5)
WBC Count: 4.5 10*3/uL (ref 4.0–10.5)
nRBC: 0 % (ref 0.0–0.2)

## 2019-05-31 LAB — CMP (CANCER CENTER ONLY)
ALT: 25 U/L (ref 0–44)
AST: 18 U/L (ref 15–41)
Albumin: 3.8 g/dL (ref 3.5–5.0)
Alkaline Phosphatase: 84 U/L (ref 38–126)
Anion gap: 11 (ref 5–15)
BUN: 17 mg/dL (ref 8–23)
CO2: 23 mmol/L (ref 22–32)
Calcium: 9.1 mg/dL (ref 8.9–10.3)
Chloride: 106 mmol/L (ref 98–111)
Creatinine: 0.85 mg/dL (ref 0.44–1.00)
GFR, Est AFR Am: >60 mL/min (ref >60)
GFR, Estimated: >60 mL/min (ref >60)
Glucose, Bld: 62 mg/dL — ABNORMAL LOW (ref 70–99)
Potassium: 4 mmol/L (ref 3.5–5.1)
Sodium: 140 mmol/L (ref 135–145)
Total Bilirubin: 0.3 mg/dL (ref 0.3–1.2)
Total Protein: 6.7 g/dL (ref 6.5–8.1)

## 2019-05-31 LAB — TOTAL PROTEIN, URINE DIPSTICK: Protein, ur: NEGATIVE mg/dL

## 2019-05-31 MED ORDER — SODIUM CHLORIDE 0.9 % IV SOLN
10.4000 mg/kg | Freq: Once | INTRAVENOUS | Status: AC
  Administered 2019-05-31: 700 mg via INTRAVENOUS
  Filled 2019-05-31: qty 16

## 2019-05-31 MED ORDER — ALTEPLASE 2 MG IJ SOLR
2.0000 mg | Freq: Once | INTRAMUSCULAR | Status: AC
  Administered 2019-05-31: 2 mg
  Filled 2019-05-31: qty 2

## 2019-05-31 MED ORDER — FAMOTIDINE IN NACL 20-0.9 MG/50ML-% IV SOLN
INTRAVENOUS | Status: AC
  Filled 2019-05-31: qty 50

## 2019-05-31 MED ORDER — DIPHENHYDRAMINE HCL 50 MG/ML IJ SOLN
INTRAMUSCULAR | Status: AC
  Filled 2019-05-31: qty 1

## 2019-05-31 MED ORDER — FAMOTIDINE IN NACL 20-0.9 MG/50ML-% IV SOLN
20.0000 mg | Freq: Once | INTRAVENOUS | Status: AC
  Administered 2019-05-31: 20 mg via INTRAVENOUS

## 2019-05-31 MED ORDER — SODIUM CHLORIDE 0.9% FLUSH
10.0000 mL | Freq: Once | INTRAVENOUS | Status: DC
  Filled 2019-05-31: qty 10

## 2019-05-31 MED ORDER — SODIUM CHLORIDE 0.9 % IV SOLN
20.0000 mg | Freq: Once | INTRAVENOUS | Status: AC
  Administered 2019-05-31: 20 mg via INTRAVENOUS
  Filled 2019-05-31: qty 2

## 2019-05-31 MED ORDER — SODIUM CHLORIDE 0.9% FLUSH
10.0000 mL | INTRAVENOUS | Status: DC | PRN
  Filled 2019-05-31: qty 10

## 2019-05-31 MED ORDER — HEPARIN SOD (PORK) LOCK FLUSH 100 UNIT/ML IV SOLN
500.0000 [IU] | Freq: Once | INTRAVENOUS | Status: DC | PRN
  Filled 2019-05-31: qty 5

## 2019-05-31 MED ORDER — SODIUM CHLORIDE 0.9 % IV SOLN
Freq: Once | INTRAVENOUS | Status: AC
  Administered 2019-05-31: 13:00:00 via INTRAVENOUS
  Filled 2019-05-31: qty 250

## 2019-05-31 MED ORDER — DIPHENHYDRAMINE HCL 50 MG/ML IJ SOLN
50.0000 mg | Freq: Once | INTRAMUSCULAR | Status: AC
  Administered 2019-05-31: 50 mg via INTRAVENOUS

## 2019-05-31 MED ORDER — SODIUM CHLORIDE 0.9 % IV SOLN
80.0000 mg/m2 | Freq: Once | INTRAVENOUS | Status: AC
  Administered 2019-05-31: 138 mg via INTRAVENOUS
  Filled 2019-05-31: qty 23

## 2019-05-31 NOTE — Patient Instructions (Addendum)
[facility] Discharge Instructions for Patients Receiving Chemotherapy  Today you received the following chemotherapy agents: Taxol and Avastin   To help prevent nausea and vomiting after your treatment, we encourage you to take your nausea medications as directed.   If you develop nausea and vomiting that is not controlled by your nausea medication, call the clinic.   BELOW ARE SYMPTOMS THAT SHOULD BE REPORTED IMMEDIATELY:  *FEVER GREATER THAN 100.5 F  *CHILLS WITH OR WITHOUT FEVER  NAUSEA AND VOMITING THAT IS NOT CONTROLLED WITH YOUR NAUSEA MEDICATION  *UNUSUAL SHORTNESS OF BREATH  *UNUSUAL BRUISING OR BLEEDING  TENDERNESS IN MOUTH AND THROAT WITH OR WITHOUT PRESENCE OF ULCERS  *URINARY PROBLEMS  *BOWEL PROBLEMS  UNUSUAL RASH Items with * indicate a potential emergency and should be followed up as soon as possible.  Feel free to call the clinic should you have any questions or concerns. The clinic phone number is [phone].  Please show the [place] at check-in to the Emergency Department and triage nurse.  Bevacizumab injection (Avastin) What is this medicine? BEVACIZUMAB (be va SIZ yoo mab) is a monoclonal antibody. It is used to treat many types of cancer. This medicine may be used for other purposes; ask your health care provider or pharmacist if you have questions. COMMON BRAND NAME(S): Avastin, MVASI, Zirabev What should I tell my health care provider before I take this medicine? They need to know if you have any of these conditions:  diabetes  heart disease  high blood pressure  history of coughing up blood  prior anthracycline chemotherapy (e.g., doxorubicin, daunorubicin, epirubicin)  recent or ongoing radiation therapy  recent or planning to have surgery  stroke  an unusual or allergic reaction to bevacizumab, hamster proteins, mouse proteins, other medicines, foods, dyes, or preservatives  pregnant or trying to get  pregnant  breast-feeding How should I use this medicine? This medicine is for infusion into a vein. It is given by a health care professional in a hospital or clinic setting. Talk to your pediatrician regarding the use of this medicine in children. Special care may be needed. Overdosage: If you think you have taken too much of this medicine contact a poison control center or emergency room at once. NOTE: This medicine is only for you. Do not share this medicine with others. What if I miss a dose? It is important not to miss your dose. Call your doctor or health care professional if you are unable to keep an appointment. What may interact with this medicine? Interactions are not expected. This list may not describe all possible interactions. Give your health care provider a list of all the medicines, herbs, non-prescription drugs, or dietary supplements you use. Also tell them if you smoke, drink alcohol, or use illegal drugs. Some items may interact with your medicine. What should I watch for while using this medicine? Your condition will be monitored carefully while you are receiving this medicine. You will need important blood work and urine testing done while you are taking this medicine. This medicine may increase your risk to bruise or bleed. Call your doctor or health care professional if you notice any unusual bleeding. This medicine should be started at least 28 days following major surgery and the site of the surgery should be totally healed. Check with your doctor before scheduling dental work or surgery while you are receiving this treatment. Talk to your doctor if you have recently had surgery or if you have a  wound that has not healed. Do not become pregnant while taking this medicine or for 6 months after stopping it. Women should inform their doctor if they wish to become pregnant or think they might be pregnant. There is a potential for serious side effects to an unborn child. Talk  to your health care professional or pharmacist for more information. Do not breast-feed an infant while taking this medicine and for 6 months after the last dose. This medicine has caused ovarian failure in some women. This medicine may interfere with the ability to have a child. You should talk to your doctor or health care professional if you are concerned about your fertility. What side effects may I notice from receiving this medicine? Side effects that you should report to your doctor or health care professional as soon as possible:  allergic reactions like skin rash, itching or hives, swelling of the face, lips, or tongue  chest pain or chest tightness  chills  coughing up blood  high fever  seizures  severe constipation  signs and symptoms of bleeding such as bloody or black, tarry stools; red or dark-brown urine; spitting up blood or brown material that looks like coffee grounds; red spots on the skin; unusual bruising or bleeding from the eye, gums, or nose  signs and symptoms of a blood clot such as breathing problems; chest pain; severe, sudden headache; pain, swelling, warmth in the leg  signs and symptoms of a stroke like changes in vision; confusion; trouble speaking or understanding; severe headaches; sudden numbness or weakness of the face, arm or leg; trouble walking; dizziness; loss of balance or coordination  stomach pain  sweating  swelling of legs or ankles  vomiting  weight gain Side effects that usually do not require medical attention (report to your doctor or health care professional if they continue or are bothersome):  back pain  changes in taste  decreased appetite  dry skin  nausea  tiredness This list may not describe all possible side effects. Call your doctor for medical advice about side effects. You may report side effects to FDA at 1-800-FDA-1088. Where should I keep my medicine? This drug is given in a hospital or clinic and will  not be stored at home. NOTE: This sheet is a summary. It may not cover all possible information. If you have questions about this medicine, talk to your doctor, pharmacist, or health care provider.  2020 Elsevier/Gold Standard (2016-09-30 14:33:29)  Paclitaxel injection What is this medicine? PACLITAXEL (PAK li TAX el) is a chemotherapy drug. It targets fast dividing cells, like cancer cells, and causes these cells to die. This medicine is used to treat ovarian cancer, breast cancer, lung cancer, Kaposi's sarcoma, and other cancers. This medicine may be used for other purposes; ask your health care provider or pharmacist if you have questions. COMMON BRAND NAME(S): Onxol, Taxol What should I tell my health care provider before I take this medicine? They need to know if you have any of these conditions:  history of irregular heartbeat  liver disease  low blood counts, like low white cell, platelet, or red cell counts  lung or breathing disease, like asthma  tingling of the fingers or toes, or other nerve disorder  an unusual or allergic reaction to paclitaxel, alcohol, polyoxyethylated castor oil, other chemotherapy, other medicines, foods, dyes, or preservatives  pregnant or trying to get pregnant  breast-feeding How should I use this medicine? This drug is given as an infusion into a vein.  It is administered in a hospital or clinic by a specially trained health care professional. Talk to your pediatrician regarding the use of this medicine in children. Special care may be needed. Overdosage: If you think you have taken too much of this medicine contact a poison control center or emergency room at once. NOTE: This medicine is only for you. Do not share this medicine with others. What if I miss a dose? It is important not to miss your dose. Call your doctor or health care professional if you are unable to keep an appointment. What may interact with this medicine? Do not take this  medicine with any of the following medications:  disulfiram  metronidazole This medicine may also interact with the following medications:  antiviral medicines for hepatitis, HIV or AIDS  certain antibiotics like erythromycin and clarithromycin  certain medicines for fungal infections like ketoconazole and itraconazole  certain medicines for seizures like carbamazepine, phenobarbital, phenytoin  gemfibrozil  nefazodone  rifampin  St. John's wort This list may not describe all possible interactions. Give your health care provider a list of all the medicines, herbs, non-prescription drugs, or dietary supplements you use. Also tell them if you smoke, drink alcohol, or use illegal drugs. Some items may interact with your medicine. What should I watch for while using this medicine? Your condition will be monitored carefully while you are receiving this medicine. You will need important blood work done while you are taking this medicine. This medicine can cause serious allergic reactions. To reduce your risk you will need to take other medicine(s) before treatment with this medicine. If you experience allergic reactions like skin rash, itching or hives, swelling of the face, lips, or tongue, tell your doctor or health care professional right away. In some cases, you may be given additional medicines to help with side effects. Follow all directions for their use. This drug may make you feel generally unwell. This is not uncommon, as chemotherapy can affect healthy cells as well as cancer cells. Report any side effects. Continue your course of treatment even though you feel ill unless your doctor tells you to stop. Call your doctor or health care professional for advice if you get a fever, chills or sore throat, or other symptoms of a cold or flu. Do not treat yourself. This drug decreases your body's ability to fight infections. Try to avoid being around people who are sick. This medicine may  increase your risk to bruise or bleed. Call your doctor or health care professional if you notice any unusual bleeding. Be careful brushing and flossing your teeth or using a toothpick because you may get an infection or bleed more easily. If you have any dental work done, tell your dentist you are receiving this medicine. Avoid taking products that contain aspirin, acetaminophen, ibuprofen, naproxen, or ketoprofen unless instructed by your doctor. These medicines may hide a fever. Do not become pregnant while taking this medicine. Women should inform their doctor if they wish to become pregnant or think they might be pregnant. There is a potential for serious side effects to an unborn child. Talk to your health care professional or pharmacist for more information. Do not breast-feed an infant while taking this medicine. Men are advised not to father a child while receiving this medicine. This product may contain alcohol. Ask your pharmacist or healthcare provider if this medicine contains alcohol. Be sure to tell all healthcare providers you are taking this medicine. Certain medicines, like metronidazole and disulfiram, can  cause an unpleasant reaction when taken with alcohol. The reaction includes flushing, headache, nausea, vomiting, sweating, and increased thirst. The reaction can last from 30 minutes to several hours. What side effects may I notice from receiving this medicine? Side effects that you should report to your doctor or health care professional as soon as possible:  allergic reactions like skin rash, itching or hives, swelling of the face, lips, or tongue  breathing problems  changes in vision  fast, irregular heartbeat  high or low blood pressure  mouth sores  pain, tingling, numbness in the hands or feet  signs of decreased platelets or bleeding - bruising, pinpoint red spots on the skin, black, tarry stools, blood in the urine  signs of decreased red blood cells -  unusually weak or tired, feeling faint or lightheaded, falls  signs of infection - fever or chills, cough, sore throat, pain or difficulty passing urine  signs and symptoms of liver injury like dark yellow or brown urine; general ill feeling or flu-like symptoms; light-colored stools; loss of appetite; nausea; right upper belly pain; unusually weak or tired; yellowing of the eyes or skin  swelling of the ankles, feet, hands  unusually slow heartbeat Side effects that usually do not require medical attention (report to your doctor or health care professional if they continue or are bothersome):  diarrhea  hair loss  loss of appetite  muscle or joint pain  nausea, vomiting  pain, redness, or irritation at site where injected  tiredness This list may not describe all possible side effects. Call your doctor for medical advice about side effects. You may report side effects to FDA at 1-800-FDA-1088. Where should I keep my medicine? This drug is given in a hospital or clinic and will not be stored at home. NOTE: This sheet is a summary. It may not cover all possible information. If you have questions about this medicine, talk to your doctor, pharmacist, or health care provider.  2020 Elsevier/Gold Standard (2017-06-06 13:14:55)

## 2019-06-01 LAB — CA 125: Cancer Antigen (CA) 125: 84 U/mL — ABNORMAL HIGH (ref 0.0–38.1)

## 2019-06-04 ENCOUNTER — Telehealth: Payer: Self-pay | Admitting: Oncology

## 2019-06-04 ENCOUNTER — Other Ambulatory Visit: Payer: Self-pay | Admitting: Hematology and Oncology

## 2019-06-04 DIAGNOSIS — R030 Elevated blood-pressure reading, without diagnosis of hypertension: Secondary | ICD-10-CM

## 2019-06-04 MED ORDER — AMLODIPINE BESYLATE 5 MG PO TABS: 5.0000 mg | ORAL_TABLET | Freq: Every day | ORAL | 1 refills | Status: DC

## 2019-06-04 NOTE — Telephone Encounter (Signed)
I recommend starting her on low dose amlodipine She starts in the evening I can send to her pharmacy

## 2019-06-04 NOTE — Telephone Encounter (Signed)
done

## 2019-06-04 NOTE — Telephone Encounter (Signed)
Called Jessica Johnston back and advised her of message below from Dr. Alvy Bimler.  She verbalized understanding and would like the amlodipine sent to [facility].

## 2019-06-04 NOTE — Telephone Encounter (Signed)
Jessica Johnston called and said her blood pressure last night was high at 154/99 and went down to 914 systolic when she tried to relax. She tried to call the on call number last night but was not able to talk with anyone.  This morning it was 107/80 and 113/80.  She is wondering if she needs to do anything.

## 2019-06-06 ENCOUNTER — Telehealth: Payer: Self-pay | Admitting: Oncology

## 2019-06-06 NOTE — Telephone Encounter (Signed)
Yes ok 

## 2019-06-06 NOTE — Telephone Encounter (Signed)
Called Bernardine back and let her know that Lamisil is ok to use.

## 2019-06-06 NOTE — Telephone Encounter (Signed)
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  She also said her bp was 125/80 yesterday and 103/81 this morning. She said she is feeling good.

## 2019-06-07 ENCOUNTER — Encounter: Payer: Self-pay | Admitting: Hematology and Oncology

## 2019-06-07 ENCOUNTER — Other Ambulatory Visit: Payer: Self-pay

## 2019-06-07 ENCOUNTER — Inpatient Hospital Stay: Payer: Medicare Other

## 2019-06-07 ENCOUNTER — Inpatient Hospital Stay (HOSPITAL_BASED_OUTPATIENT_CLINIC_OR_DEPARTMENT_OTHER): Payer: Medicare Other | Admitting: Hematology and Oncology

## 2019-06-07 DIAGNOSIS — Z5111 Encounter for antineoplastic chemotherapy: Secondary | ICD-10-CM | POA: Diagnosis not present

## 2019-06-07 DIAGNOSIS — G620 Drug-induced polyneuropathy: Secondary | ICD-10-CM | POA: Diagnosis not present

## 2019-06-07 DIAGNOSIS — C5702 Malignant neoplasm of left fallopian tube: Secondary | ICD-10-CM

## 2019-06-07 DIAGNOSIS — R030 Elevated blood-pressure reading, without diagnosis of hypertension: Secondary | ICD-10-CM

## 2019-06-07 DIAGNOSIS — D61818 Other pancytopenia: Secondary | ICD-10-CM

## 2019-06-07 DIAGNOSIS — T451X5A Adverse effect of antineoplastic and immunosuppressive drugs, initial encounter: Secondary | ICD-10-CM | POA: Diagnosis not present

## 2019-06-07 DIAGNOSIS — C5700 Malignant neoplasm of unspecified fallopian tube: Secondary | ICD-10-CM

## 2019-06-07 DIAGNOSIS — Z7189 Other specified counseling: Secondary | ICD-10-CM

## 2019-06-07 DIAGNOSIS — Z452 Encounter for adjustment and management of vascular access device: Secondary | ICD-10-CM | POA: Diagnosis not present

## 2019-06-07 DIAGNOSIS — Z95828 Presence of other vascular implants and grafts: Secondary | ICD-10-CM

## 2019-06-07 DIAGNOSIS — Z5112 Encounter for antineoplastic immunotherapy: Secondary | ICD-10-CM | POA: Diagnosis not present

## 2019-06-07 LAB — CBC WITH DIFFERENTIAL (CANCER CENTER ONLY)
Abs Immature Granulocytes: 0.02 10*3/uL (ref 0.00–0.07)
Basophils Absolute: 0 10*3/uL (ref 0.0–0.1)
Basophils Relative: 1 %
Eosinophils Absolute: 0.1 10*3/uL (ref 0.0–0.5)
Eosinophils Relative: 2 %
HCT: 35 % — ABNORMAL LOW (ref 36.0–46.0)
Hemoglobin: 12 g/dL (ref 12.0–15.0)
Immature Granulocytes: 1 %
Lymphocytes Relative: 24 %
Lymphs Abs: 0.9 10*3/uL (ref 0.7–4.0)
MCH: 31 pg (ref 26.0–34.0)
MCHC: 34.3 g/dL (ref 30.0–36.0)
MCV: 90.4 fL (ref 80.0–100.0)
Monocytes Absolute: 0.3 10*3/uL (ref 0.1–1.0)
Monocytes Relative: 8 %
Neutro Abs: 2.4 10*3/uL (ref 1.7–7.7)
Neutrophils Relative %: 64 %
Platelet Count: 201 10*3/uL (ref 150–400)
RBC: 3.87 MIL/uL (ref 3.87–5.11)
RDW: 11.9 % (ref 11.5–15.5)
WBC Count: 3.7 10*3/uL — ABNORMAL LOW (ref 4.0–10.5)
nRBC: 0 % (ref 0.0–0.2)

## 2019-06-07 LAB — CMP (CANCER CENTER ONLY)
ALT: 23 U/L (ref 0–44)
AST: 16 U/L (ref 15–41)
Albumin: 3.6 g/dL (ref 3.5–5.0)
Alkaline Phosphatase: 85 U/L (ref 38–126)
Anion gap: 9 (ref 5–15)
BUN: 12 mg/dL (ref 8–23)
CO2: 23 mmol/L (ref 22–32)
Calcium: 8.7 mg/dL — ABNORMAL LOW (ref 8.9–10.3)
Chloride: 103 mmol/L (ref 98–111)
Creatinine: 0.73 mg/dL (ref 0.44–1.00)
GFR, Est AFR Am: >60 mL/min (ref >60)
GFR, Estimated: >60 mL/min (ref >60)
Glucose, Bld: 133 mg/dL — ABNORMAL HIGH (ref 70–99)
Potassium: 4.7 mmol/L (ref 3.5–5.1)
Sodium: 135 mmol/L (ref 135–145)
Total Bilirubin: 0.4 mg/dL (ref 0.3–1.2)
Total Protein: 6.6 g/dL (ref 6.5–8.1)

## 2019-06-07 MED ORDER — SODIUM CHLORIDE 0.9 % IV SOLN
20.0000 mg | Freq: Once | INTRAVENOUS | Status: AC
  Administered 2019-06-07: 12:00:00 20 mg via INTRAVENOUS
  Filled 2019-06-07: qty 20

## 2019-06-07 MED ORDER — HEPARIN SOD (PORK) LOCK FLUSH 100 UNIT/ML IV SOLN
500.0000 [IU] | Freq: Once | INTRAVENOUS | Status: AC | PRN
  Administered 2019-06-07: 14:00:00 500 [IU]
  Filled 2019-06-07: qty 5

## 2019-06-07 MED ORDER — FAMOTIDINE IN NACL 20-0.9 MG/50ML-% IV SOLN
20.0000 mg | Freq: Once | INTRAVENOUS | Status: AC
  Administered 2019-06-07: 12:00:00 20 mg via INTRAVENOUS

## 2019-06-07 MED ORDER — SODIUM CHLORIDE 0.9 % IV SOLN
80.0000 mg/m2 | Freq: Once | INTRAVENOUS | Status: AC
  Administered 2019-06-07: 13:00:00 138 mg via INTRAVENOUS
  Filled 2019-06-07: qty 23

## 2019-06-07 MED ORDER — SODIUM CHLORIDE 0.9% FLUSH
10.0000 mL | INTRAVENOUS | Status: DC | PRN
  Administered 2019-06-07: 10 mL
  Filled 2019-06-07: qty 10

## 2019-06-07 MED ORDER — SODIUM CHLORIDE 0.9% FLUSH
10.0000 mL | Freq: Once | INTRAVENOUS | Status: AC
  Administered 2019-06-07: 10 mL
  Filled 2019-06-07: qty 10

## 2019-06-07 MED ORDER — DIPHENHYDRAMINE HCL 50 MG/ML IJ SOLN
50.0000 mg | Freq: Once | INTRAMUSCULAR | Status: AC
  Administered 2019-06-07: 12:00:00 50 mg via INTRAVENOUS

## 2019-06-07 MED ORDER — SODIUM CHLORIDE 0.9 % IV SOLN
Freq: Once | INTRAVENOUS | Status: AC
  Administered 2019-06-07: 11:00:00 via INTRAVENOUS
  Filled 2019-06-07: qty 250

## 2019-06-07 NOTE — Assessment & Plan Note (Signed)
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

## 2019-06-07 NOTE — Patient Instructions (Signed)
[facility] Discharge Instructions for Patients Receiving Chemotherapy  Today you received the following chemotherapy agents :  Taxol.  To help prevent nausea and vomiting after your treatment, we encourage you to take your nausea medication as prescribed.   If you develop nausea and vomiting that is not controlled by your nausea medication, call the clinic.   BELOW ARE SYMPTOMS THAT SHOULD BE REPORTED IMMEDIATELY:  *FEVER GREATER THAN 100.5 F  *CHILLS WITH OR WITHOUT FEVER  NAUSEA AND VOMITING THAT IS NOT CONTROLLED WITH YOUR NAUSEA MEDICATION  *UNUSUAL SHORTNESS OF BREATH  *UNUSUAL BRUISING OR BLEEDING  TENDERNESS IN MOUTH AND THROAT WITH OR WITHOUT PRESENCE OF ULCERS  *URINARY PROBLEMS  *BOWEL PROBLEMS  UNUSUAL RASH Items with * indicate a potential emergency and should be followed up as soon as possible.  Feel free to call the clinic should you have any questions or concerns. The clinic phone number is [phone].  Please show the CHEMO ALERT CARD at check-in to the Emergency Department and triage nurse.   

## 2019-06-07 NOTE — Progress Notes (Signed)
[facility] OFFICE PROGRESS NOTE  Patient Care Team: [name], MD as PCP - General (Internal Medicine)  ASSESSMENT & PLAN:  Fallopian tube cancer, carcinoma ([place]) So far, she tolerated treatment well without major side effects except for elevated blood pressure Since introduction of amlodipine, her blood pressure has improved We will continue treatment for minimum 3 months before repeat imaging study  Elevated BP without diagnosis of hypertension Her elevated blood pressure is due to bevacizumab She will continue amlodipine and blood pressure monitoring twice a day She is instructed to call me if systolic blood pressure greater than 295 or diastolic greater than 95  Pancytopenia, acquired ([place]) She has mild leukopenia secondary to treatment but not symptomatic Observe only  Chemotherapy-induced peripheral neuropathy ([place]) She denies worsening neuropathy from treatment Observe only   No orders of the defined types were placed in this encounter.   INTERVAL HISTORY: Please see below for problem oriented charting. 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 Extensive omental nodularity highly worrisome for peritoneal carcinomatosis. Late recurrence of melanoma can present as peritoneal carcinomatosis. Ovarian cancer more commonly  presents in this manner. Patient does have a predominately low density right adnexal lesion measuring up to 3.5 cm, although this lesion is not typical for ovarian cancer. 2. No evidence of bowel or ureteral obstruction. 3. No other definite signs of metastatic disease. There are small indeterminate low-density hepatic lesions.   02/08/2016 Tumor Marker   Patient's tumor was tested for the following markers: CA-125 Results of the tumor marker test revealed 656.2   02/15/2016 Procedure   CT-guided core biopsy performed of omental mass just deep to the abdominal wall.   02/15/2016 Pathology Results   Omentum, biopsy, left - PAPILLARY SEROUS NEOPLASM, SEE COMMENT. Microscopic Comment There are papillary collections of largely low grade appearing cells with numerous psammoma bodies. Given the limited material it is difficult to distinguish between invasive implants of a serous borderline tumor and serous carcinoma, especially given the low grade appearance.   02/18/2016 Pathology Results   1. Omentum, resection for tumor INVASIVE IMPLANT OF HIGH GRADE SEROUS CARCINOMA 2. Uterus +/- tubes/ovaries, neoplastic HIGH GRADE SEROUS CARCINOMA INVOLVING LEFT TUBAL FIMBRIA SEROUS CARCINOMA WITH PREDOMINANT PSAMMOMA BODIES IMPLANT AT UTERUS SEROSA, BILATERAL FALLOPIAN TUBAL SEROSA, AND ANTERIOR PERITONEAL REFLECTION CERVIX: HISTOLOGICAL UNREMARKABLE ENDOMETRIUM: INACTIVE ENDOMETRIUM MYOMETRIUM: LEIOMYOMA LEFT OVARY: CYSTADENOFIBROMA RIGHT OVARY AND FALLOPIAN TUBE: HISTOLOGICAL UNREMARKABLE Microscopic Comment 2. ONCOLOGY TABLE - FALLOPIAN TUBE 1. Specimen, including laterality: Omentum, uterus, bilateral ovaries and fallopian tubes 2. Procedure: Hysterectomy, bilateral salpingo-oophorectomy and tumor debulking omentectomy 3. Lymph node sampling performed: No 4. Tumor site: uterus serosa, bilateral fallopian tubal serosa, peritoneal and omentum 5. Tumor location in fallopian tube: Left fallopian tubal  fimbria 6. Specimen integrity (intact/ruptured/disrupted): Intact 7. Tumor size (cm): multi focal invasive omentum implant greater than 2 cm, left fallopian tube tumor 0.8 cm.  8. Histologic type: Serous carcinoma 9. Grade: 3 10. Microscopic tumor extension: uterus serosa, bilateral fallopian tubal serosa, peritoneal and omentum 11. Margins: NA 12. Lymph-Vascular invasion: identified 13. Lymph nodes: # examined: 0; # positive: NA 14. TNM: pT3c, pNx 15. FIGO Stage (based on pathologic findings, needs clinical correlation: IIIC 16. Comment: High grade serous carcinoma multifocally and extensively involves left fallopian tubal fimbria, omentum, uterus serosa, bilateral fallopian tubal serosa and peritoneal. At the left fallopian tube there are small foci of serous tubal intraepithelial carcinoma identified, so we conclude the carcinoma is fallopian tube origin   03/09/2016 Tumor Marker   Patient's tumor was tested for the following markers: CA-125 Results of the tumor marker test revealed 154.4   03/15/2016 Procedure   Technically successful right IJ power-injectable port catheter placement. Ready for routine use.   03/18/2016 - 07/13/2016 Chemotherapy   The patient had 6 cycles of carboplatin and taxol   04/14/2016 Tumor Marker   Patient's tumor was tested for the following markers: CA-125 Results of the tumor marker test revealed 36.7   04/25/2016 Genetic Testing   Patient has genetic testing done for germline mutation Results revealed patient has no mutation   04/28/2016 Tumor Marker   Patient's tumor was tested for the following markers: CA-125 Results of the tumor marker test revealed 24.6   06/21/2016 Tumor Marker   Patient's tumor was tested for the following markers: CA-125 Results of the tumor marker test revealed 16.6   08/01/2016 Tumor Marker   Patient's tumor was tested for the following markers: CA-125 Results of the tumor marker test revealed 17.2   08/05/2016 Imaging    Interval TAH-BSO. No evidence of residual pelvic mass or metastatic disease within the abdomen or pelvis. No other acute findings.    11/04/2016 Tumor Marker   Patient's tumor was tested for the following markers: CA-125 Results of the tumor marker test revealed 13.6   01/20/2017 Tumor Marker   Patient's tumor was tested for the following markers: CA-125 Results of the tumor marker test revealed 13.8   04/14/2017 Tumor Marker   Patient's tumor was tested for the following markers: CA-125 Results of the tumor marker test revealed 16.1   06/13/2017 Imaging   No acute findings.  No mass or hernia identified.  Colonic diverticulosis, without radiographic evidence of diverticulitis.  Aortic atherosclerosis.   07/03/2017 Tumor Marker   Patient's tumor was tested for the following markers: CA-125 Results of the tumor marker test revealed 18.4   09/19/2017 Tumor Marker   Patient's tumor was tested for the following markers: CA-125 Results of the tumor marker test revealed 18.5   01/23/2018 Tumor Marker   Patient's tumor was tested for the following markers: CA-125 Results of the tumor marker test revealed 35.4   01/30/2018 Imaging   1. No evidence of local fallopian tube carcinoma recurrence within the pelvis. Post hysterectomy. 2. No evidence of metastatic peritoneal disease or omental disease. No solid organ metastasis.    03/20/2018 Tumor Marker   Patient's tumor was tested for the following markers: CA-125 Results of the tumor marker test revealed 73   05/02/2018 Tumor Marker   Patient's tumor was tested for the following markers: CA-125 Results of the tumor marker test revealed 127.1   05/08/2018 Imaging   CT abdomen and pelvis 1. 7 cm left subdiaphragmatic collection of loculated fluid versus low-density soft tissue. Given the patient's history of rising CA 125, metastatic disease considered highly likely.  2. Areas of fluid or recurrent disease  identified in the right lower  quadrant adjacent to the cecum and along right lower quadrant small bowel loops.   05/14/2018 Tumor Marker   Patient's tumor was tested for the following markers: CA-125 Results of the tumor marker test revealed 166    Genetic Testing   Patient has genetic testing done for ER/PR and MMR. Results revealed patient has the following on 02/18/2016 surgical pathology: ER 20%, PR 0% MMR: normal    Genetic Testing   Patient has genetic testing done for MSI. Results revealed patient has the following: MSI stable   05/18/2018 - 12/31/2018 Chemotherapy   The patient had carboplatin and Doxil   07/16/2018 Tumor Marker   Patient's tumor was tested for the following markers: CA-125 Results of the tumor marker test revealed 28.7   08/10/2018 Imaging   CT imaging:  Decreased size of peritoneal soft tissue masses in left upper quadrant, consistent with decreased metastatic disease.  No new or progressive metastatic disease. No other acute findings.  Colonic diverticulosis, without radiographic evidence of diverticulitis.  Stable small paraumbilical ventral hernia containing transverse colon.   08/13/2018 Tumor Marker   Patient's tumor was tested for the following markers: CA-125 Results of the tumor marker test revealed 21.6   08/24/2018 Echocardiogram   LV EF: 60% -  65%   10/15/2018 Tumor Marker   Patient's tumor was tested for the following markers: CA-125 Results of the tumor marker test revealed 20.1   10/19/2018 Imaging   Ct scan of abdomen and pelvis Status post hysterectomy, bilateral salpingo-oophorectomy, and omentectomy.  Two peritoneal implants in the left upper abdomen are stable versus mildly decreased, as above. No abdominopelvic ascites.  No evidence of new/progressive metastatic disease.     11/19/2018 Tumor Marker   Patient's tumor was tested for the following markers: CA-125 Results of the tumor marker test revealed 21.7   11/23/2018 Echocardiogram   1. The  left ventricle has normal systolic function of 97-98%. The cavity size was normal. There is no increased left ventricular wall thickness. Echo evidence of impaired diastolic relaxation.  2. The right ventricle has normal systolic function. The cavity was normal. There is no increase in right ventricular wall thickness.  3. The mitral valve is normal in structure. There is mild mitral annular calcification present.  4. The tricuspid valve is normal in structure.  5. The aortic valve is tricuspid There is mild thickening of the aortic valve.  6. The pulmonic valve was normal in structure. Pulmonic valve regurgitation is mild by color flow Doppler.  7. Normal LV systolic function; mild diastolic dysfunction.   12/31/2018 Tumor Marker   Patient's tumor was tested for the following markers: CA-125 Results of the tumor marker test revealed 17.1   01/31/2019 Imaging   1. Left upper quadrant peritoneal implants described previously have decreased in the interval. No new or progressive findings in the abdomen/pelvis today. No free fluid. 2. Right paraumbilical ventral hernia contains a small knuckle of transverse colon without complicating features. 3.  Aortic Atherosclerois (ICD10-170.0)  Aortic Atherosclerosis (ICD10-I70.0).   01/31/2019 Tumor Marker   Patient's tumor was tested for the following markers: CA-125 Results of the tumor marker test revealed 18.9   02/11/2019 - 03/13/2019 Chemotherapy   The patient is taking Niraparib. She self discontinued after 1 month   02/25/2019 Tumor Marker   Patient's tumor was tested for the following markers: CA-125 Results of the tumor marker test revealed 18.6   05/13/2019 Tumor Marker   Patient's tumor was  tested for the following markers: CA-125 Results of the tumor marker test revealed 230   05/22/2019 Imaging   1. Increased size of 4.6 cm soft tissue mass in the gastrosplenic ligament, consistent with metastatic disease. 2. No other sites of  metastatic disease identified within the abdomen or pelvis. 3. Colonic diverticulosis. No radiographic evidence of diverticulitis. 4. Stable small right paraumbilical hernia containing transverse colon.   Aortic Atherosclerosis (ICD10-I70.0).   05/31/2019 -  Chemotherapy   The patient had bevacizumab and Taxol for chemotherapy treatment.     05/31/2019 Tumor Marker   Patient's tumor was tested for the following markers: CA-125 Results of the tumor marker test revealed 84     REVIEW OF SYSTEMS:   Constitutional: Denies fevers, chills or abnormal weight loss Eyes: Denies blurriness of vision Ears, nose, mouth, throat, and face: Denies mucositis or sore throat Respiratory: Denies cough, dyspnea or wheezes Cardiovascular: Denies palpitation, chest discomfort or lower extremity swelling Gastrointestinal:  Denies nausea, heartburn or change in bowel habits Skin: Denies abnormal skin rashes Lymphatics: Denies new lymphadenopathy or easy bruising Neurological:Denies numbness, tingling or new weaknesses Behavioral/Psych: Mood is stable, no new changes  All other systems were reviewed with the patient and are negative.  I have reviewed the past medical history, past surgical history, social history and family history with the patient and they are unchanged from previous note.  ALLERGIES:  has No Known Allergies.  MEDICATIONS:  Current Outpatient Medications  Medication Sig Dispense Refill  . amLODipine (NORVASC) 5 MG tablet Take 1 tablet (5 mg total) by mouth daily. 30 tablet 1  . Cholecalciferol (VITAMIN D) 2000 units CAPS Take 1 capsule by mouth daily.    Marland Kitchen ezetimibe (ZETIA) 10 MG tablet Take 10 mg by mouth daily.    Marland Kitchen lidocaine-prilocaine (EMLA) cream Apply to affected area once 30 g 3  . ondansetron (ZOFRAN) 8 MG tablet Take 1 tablet (8 mg total) by mouth 2 (two) times daily as needed (Nausea or vomiting). 30 tablet 1  . prochlorperazine (COMPAZINE) 10 MG tablet Take 1 tablet (10 mg  total) by mouth every 6 (six) hours as needed for nausea or vomiting. (Patient not taking: Reported on 05/15/2019) 30 tablet 1  . prochlorperazine (COMPAZINE) 10 MG tablet Take 1 tablet (10 mg total) by mouth every 6 (six) hours as needed (Nausea or vomiting). 30 tablet 1   No current facility-administered medications for this visit.    Facility-Administered Medications Ordered in Other Visits  Medication Dose Route Frequency Provider Last Rate Last Dose  . sodium chloride flush (NS) 0.9 % injection 10 mL  10 mL Intracatheter PRN Alvy Bimler, Alberto Pina, MD   10 mL at 06/07/19 1350    PHYSICAL EXAMINATION: ECOG PERFORMANCE STATUS: 1 - Symptomatic but completely ambulatory  Vitals:   06/07/19 1018  BP: 119/60  Pulse: 65  Resp: 18  Temp: 98.5 F (36.9 C)  SpO2: 100%   Filed Weights   06/07/19 1018  Weight: 154 lb (69.9 kg)    GENERAL:alert, no distress and comfortable SKIN: skin color, texture, turgor are normal, no rashes or significant lesions EYES: normal, Conjunctiva are pink and non-injected, sclera clear OROPHARYNX:no exudate, no erythema and lips, buccal mucosa, and tongue normal  NECK: supple, thyroid normal size, non-tender, without nodularity LYMPH:  no palpable lymphadenopathy in the cervical, axillary or inguinal LUNGS: clear to auscultation and percussion with normal breathing effort HEART: regular rate & rhythm and no murmurs and no lower extremity edema ABDOMEN:abdomen soft, non-tender and  normal bowel sounds Musculoskeletal:no cyanosis of digits and no clubbing  NEURO: alert & oriented x 3 with fluent speech, no focal motor/sensory deficits  LABORATORY DATA:  I have reviewed the data as listed    Component Value Date/Time   NA 135 06/07/2019 0956   NA 139 06/07/2017 1430   K 4.7 06/07/2019 0956   K 3.8 06/07/2017 1430   CL 103 06/07/2019 0956   CO2 23 06/07/2019 0956   CO2 27 06/07/2017 1430   GLUCOSE 133 (H) 06/07/2019 0956   GLUCOSE 124 06/07/2017 1430   BUN 12  06/07/2019 0956   BUN 14.0 06/07/2017 1430   CREATININE 0.73 06/07/2019 0956   CREATININE 0.8 06/07/2017 1430   CALCIUM 8.7 (L) 06/07/2019 0956   CALCIUM 9.3 06/07/2017 1430   PROT 6.6 06/07/2019 0956   PROT 6.6 09/30/2016 1027   ALBUMIN 3.6 06/07/2019 0956   ALBUMIN 3.5 09/30/2016 1027   AST 16 06/07/2019 0956   AST 18 09/30/2016 1027   ALT 23 06/07/2019 0956   ALT 21 09/30/2016 1027   ALKPHOS 85 06/07/2019 0956   ALKPHOS 77 09/30/2016 1027   BILITOT 0.4 06/07/2019 0956   BILITOT 0.37 09/30/2016 1027   GFRNONAA >60 06/07/2019 0956   GFRAA >60 06/07/2019 0956    No results found for: SPEP, UPEP  Lab Results  Component Value Date   WBC 3.7 (L) 06/07/2019   NEUTROABS 2.4 06/07/2019   HGB 12.0 06/07/2019   HCT 35.0 (L) 06/07/2019   MCV 90.4 06/07/2019   PLT 201 06/07/2019      Chemistry      Component Value Date/Time   NA 135 06/07/2019 0956   NA 139 06/07/2017 1430   K 4.7 06/07/2019 0956   K 3.8 06/07/2017 1430   CL 103 06/07/2019 0956   CO2 23 06/07/2019 0956   CO2 27 06/07/2017 1430   BUN 12 06/07/2019 0956   BUN 14.0 06/07/2017 1430   CREATININE 0.73 06/07/2019 0956   CREATININE 0.8 06/07/2017 1430      Component Value Date/Time   CALCIUM 8.7 (L) 06/07/2019 0956   CALCIUM 9.3 06/07/2017 1430   ALKPHOS 85 06/07/2019 0956   ALKPHOS 77 09/30/2016 1027   AST 16 06/07/2019 0956   AST 18 09/30/2016 1027   ALT 23 06/07/2019 0956   ALT 21 09/30/2016 1027   BILITOT 0.4 06/07/2019 0956   BILITOT 0.37 09/30/2016 1027       RADIOGRAPHIC STUDIES: I have personally reviewed the radiological images as listed and agreed with the findings in the report. Ct Abdomen Pelvis W Contrast  Result Date: 05/22/2019 CLINICAL DATA:  Follow-up metastatic fallopian tube carcinoma. Recently completed chemotherapy. EXAM: CT ABDOMEN AND PELVIS WITH CONTRAST TECHNIQUE: Multidetector CT imaging of the abdomen and pelvis was performed using the standard protocol following bolus  administration of intravenous contrast. CONTRAST:  193m OMNIPAQUE IOHEXOL 300 MG/ML  SOLN COMPARISON:  01/31/2019 FINDINGS: Lower Chest: No acute findings. Hepatobiliary: No hepatic masses identified. Gallbladder is unremarkable. Pancreas:  No mass or inflammatory changes. Spleen: Within normal limits in size and appearance. Adrenals/Urinary Tract: No masses identified. No evidence of hydronephrosis. Stomach/Bowel: No evidence of obstruction, inflammatory process or abnormal fluid collections. Diverticulosis is seen mainly involving the sigmoid colon, however there is no evidence of diverticulitis. A small right paraumbilical hernia is again seen containing a loop of transverse colon. No evidence of bowel strangulation or obstruction. Vascular/Lymphatic: No pathologically enlarged lymph nodes. No abdominal aortic aneurysm. Aortic atherosclerosis. Reproductive: Prior  hysterectomy noted. Adnexal regions are unremarkable in appearance. Other: Left upper quadrant soft tissue mass in the gastrosplenic ligament measures 4.6 x 3.9 cm, compared to 3.1 x 2.5 cm previously. No other peritoneal soft tissue nodules or ascites identified. Musculoskeletal:  No suspicious bone lesions identified. IMPRESSION: 1. Increased size of 4.6 cm soft tissue mass in the gastrosplenic ligament, consistent with metastatic disease. 2. No other sites of metastatic disease identified within the abdomen or pelvis. 3. Colonic diverticulosis. No radiographic evidence of diverticulitis. 4. Stable small right paraumbilical hernia containing transverse colon. Aortic Atherosclerosis (ICD10-I70.0). Electronically Signed   By: Marlaine Hind M.D.   On: 05/22/2019 14:41    All questions were answered. The patient knows to call the clinic with any problems, questions or concerns. No barriers to learning was detected.  I spent 15 minutes counseling the patient face to face. The total time spent in the appointment was 20 minutes and more than 50% was on  counseling and review of test results  [name], MD 06/07/2019 2:06 PM

## 2019-06-07 NOTE — Assessment & Plan Note (Signed)
Her elevated blood pressure is due to bevacizumab She will continue amlodipine and blood pressure monitoring twice a day She is instructed to call me if systolic blood pressure greater than [id number] or diastolic greater than 95

## 2019-06-07 NOTE — Patient Instructions (Signed)

## 2019-06-07 NOTE — Assessment & Plan Note (Signed)
She has mild leukopenia secondary to treatment but not symptomatic Observe only

## 2019-06-07 NOTE — Assessment & Plan Note (Signed)
So far, she tolerated treatment well without major side effects except for elevated blood pressure Since introduction of amlodipine, her blood pressure has improved We will continue treatment for minimum 3 months before repeat imaging study

## 2019-06-21 ENCOUNTER — Other Ambulatory Visit: Payer: Self-pay

## 2019-06-21 ENCOUNTER — Encounter: Payer: Self-pay | Admitting: Hematology and Oncology

## 2019-06-21 ENCOUNTER — Inpatient Hospital Stay (HOSPITAL_BASED_OUTPATIENT_CLINIC_OR_DEPARTMENT_OTHER): Payer: Medicare Other | Admitting: Hematology and Oncology

## 2019-06-21 ENCOUNTER — Inpatient Hospital Stay: Payer: Medicare Other | Attending: Gynecologic Oncology

## 2019-06-21 ENCOUNTER — Inpatient Hospital Stay: Payer: Medicare Other

## 2019-06-21 DIAGNOSIS — Z5111 Encounter for antineoplastic chemotherapy: Secondary | ICD-10-CM | POA: Insufficient documentation

## 2019-06-21 DIAGNOSIS — Z5112 Encounter for antineoplastic immunotherapy: Secondary | ICD-10-CM | POA: Diagnosis not present

## 2019-06-21 DIAGNOSIS — D61818 Other pancytopenia: Secondary | ICD-10-CM | POA: Diagnosis not present

## 2019-06-21 DIAGNOSIS — C5702 Malignant neoplasm of left fallopian tube: Secondary | ICD-10-CM | POA: Diagnosis not present

## 2019-06-21 DIAGNOSIS — C5700 Malignant neoplasm of unspecified fallopian tube: Secondary | ICD-10-CM | POA: Diagnosis not present

## 2019-06-21 DIAGNOSIS — R030 Elevated blood-pressure reading, without diagnosis of hypertension: Secondary | ICD-10-CM

## 2019-06-21 DIAGNOSIS — Z95828 Presence of other vascular implants and grafts: Secondary | ICD-10-CM

## 2019-06-21 DIAGNOSIS — Z7189 Other specified counseling: Secondary | ICD-10-CM

## 2019-06-21 LAB — CMP (CANCER CENTER ONLY)
ALT: 21 U/L (ref 0–44)
AST: 16 U/L (ref 15–41)
Albumin: 3.6 g/dL (ref 3.5–5.0)
Alkaline Phosphatase: 82 U/L (ref 38–126)
Anion gap: 9 (ref 5–15)
BUN: 14 mg/dL (ref 8–23)
CO2: 24 mmol/L (ref 22–32)
Calcium: 8.8 mg/dL — ABNORMAL LOW (ref 8.9–10.3)
Chloride: 103 mmol/L (ref 98–111)
Creatinine: 0.7 mg/dL (ref 0.44–1.00)
GFR, Est AFR Am: >60 mL/min (ref >60)
GFR, Estimated: >60 mL/min (ref >60)
Glucose, Bld: 100 mg/dL — ABNORMAL HIGH (ref 70–99)
Potassium: 4.5 mmol/L (ref 3.5–5.1)
Sodium: 136 mmol/L (ref 135–145)
Total Bilirubin: 0.3 mg/dL (ref 0.3–1.2)
Total Protein: 6.4 g/dL — ABNORMAL LOW (ref 6.5–8.1)

## 2019-06-21 LAB — CBC WITH DIFFERENTIAL (CANCER CENTER ONLY)
Abs Immature Granulocytes: 0 10*3/uL (ref 0.00–0.07)
Basophils Absolute: 0 10*3/uL (ref 0.0–0.1)
Basophils Relative: 1 %
Eosinophils Absolute: 0 10*3/uL (ref 0.0–0.5)
Eosinophils Relative: 1 %
HCT: 33.3 % — ABNORMAL LOW (ref 36.0–46.0)
Hemoglobin: 11.2 g/dL — ABNORMAL LOW (ref 12.0–15.0)
Immature Granulocytes: 0 %
Lymphocytes Relative: 30 %
Lymphs Abs: 0.9 10*3/uL (ref 0.7–4.0)
MCH: 30.9 pg (ref 26.0–34.0)
MCHC: 33.6 g/dL (ref 30.0–36.0)
MCV: 92 fL (ref 80.0–100.0)
Monocytes Absolute: 0.5 10*3/uL (ref 0.1–1.0)
Monocytes Relative: 16 %
Neutro Abs: 1.5 10*3/uL — ABNORMAL LOW (ref 1.7–7.7)
Neutrophils Relative %: 52 %
Platelet Count: 242 10*3/uL (ref 150–400)
RBC: 3.62 MIL/uL — ABNORMAL LOW (ref 3.87–5.11)
RDW: 12.4 % (ref 11.5–15.5)
WBC Count: 2.9 10*3/uL — ABNORMAL LOW (ref 4.0–10.5)
nRBC: 0 % (ref 0.0–0.2)

## 2019-06-21 LAB — TOTAL PROTEIN, URINE DIPSTICK: Protein, ur: NEGATIVE mg/dL

## 2019-06-21 MED ORDER — SODIUM CHLORIDE 0.9% FLUSH
10.0000 mL | Freq: Once | INTRAVENOUS | Status: AC
  Administered 2019-06-21: 10 mL
  Filled 2019-06-21: qty 10

## 2019-06-21 MED ORDER — SODIUM CHLORIDE 0.9 % IV SOLN
20.0000 mg | Freq: Once | INTRAVENOUS | Status: AC
  Administered 2019-06-21: 20 mg via INTRAVENOUS
  Filled 2019-06-21: qty 2

## 2019-06-21 MED ORDER — SODIUM CHLORIDE 0.9 % IV SOLN
700.0000 mg | Freq: Once | INTRAVENOUS | Status: AC
  Administered 2019-06-21: 700 mg via INTRAVENOUS
  Filled 2019-06-21: qty 12

## 2019-06-21 MED ORDER — FAMOTIDINE IN NACL 20-0.9 MG/50ML-% IV SOLN
INTRAVENOUS | Status: AC
  Filled 2019-06-21: qty 50

## 2019-06-21 MED ORDER — SODIUM CHLORIDE 0.9 % IV SOLN
64.0000 mg/m2 | Freq: Once | INTRAVENOUS | Status: AC
  Administered 2019-06-21: 108 mg via INTRAVENOUS
  Filled 2019-06-21: qty 18

## 2019-06-21 MED ORDER — HEPARIN SOD (PORK) LOCK FLUSH 100 UNIT/ML IV SOLN
500.0000 [IU] | Freq: Once | INTRAVENOUS | Status: AC | PRN
  Administered 2019-06-21: 500 [IU]
  Filled 2019-06-21: qty 5

## 2019-06-21 MED ORDER — DIPHENHYDRAMINE HCL 50 MG/ML IJ SOLN
INTRAMUSCULAR | Status: AC
  Filled 2019-06-21: qty 1

## 2019-06-21 MED ORDER — SODIUM CHLORIDE 0.9% FLUSH
10.0000 mL | INTRAVENOUS | Status: DC | PRN
  Administered 2019-06-21: 10 mL
  Filled 2019-06-21: qty 10

## 2019-06-21 MED ORDER — DIPHENHYDRAMINE HCL 50 MG/ML IJ SOLN
50.0000 mg | Freq: Once | INTRAMUSCULAR | Status: AC
  Administered 2019-06-21: 50 mg via INTRAVENOUS

## 2019-06-21 MED ORDER — SODIUM CHLORIDE 0.9 % IV SOLN
Freq: Once | INTRAVENOUS | Status: AC
  Administered 2019-06-21: 12:00:00 via INTRAVENOUS
  Filled 2019-06-21: qty 250

## 2019-06-21 MED ORDER — FAMOTIDINE IN NACL 20-0.9 MG/50ML-% IV SOLN
20.0000 mg | Freq: Once | INTRAVENOUS | Status: AC
  Administered 2019-06-21: 20 mg via INTRAVENOUS

## 2019-06-21 NOTE — Addendum Note (Signed)
Addended by: Tora Kindred on: 06/21/2019 03:21 PM   Modules accepted: Orders

## 2019-06-21 NOTE — Assessment & Plan Note (Signed)
She has mild progressive pancytopenia from treatment I recommend minor dose adjustment of Taxol We will proceed with treatment today I will assess her blood count next week to determine whether need to change the schedule of her treatment We discussed neutropenic precautions

## 2019-06-21 NOTE — Progress Notes (Signed)
[name] OFFICE PROGRESS NOTE  Patient Care Team: [name], MD as PCP - General (Internal Medicine)  ASSESSMENT & PLAN:  Fallopian tube cancer, carcinoma ([place]) So far, she tolerated treatment well except progressive pancytopenia I recommend minor dose adjustment We will see how she tolerate treatment this week and next week If she continues to have severe pancytopenia, I might have to modify her cycle 2-day 1 and 15 for cycle of every 28 days I will wait until her results are available next week before I schedule more appointment Her next CT imaging will be due in November Tumor markers pending  Pancytopenia, acquired ([place]) She has mild progressive pancytopenia from treatment I recommend minor dose adjustment of Taxol We will proceed with treatment today I will assess her blood count next week to determine whether need to change the schedule of her treatment We discussed neutropenic precautions  Elevated BP without diagnosis of hypertension Her blood pressure is satisfactory We will observe closely   No orders of the defined types were placed in this encounter.   INTERVAL HISTORY: Please see below for problem oriented charting. 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 Extensive omental nodularity highly worrisome for peritoneal carcinomatosis. Late recurrence of melanoma can present as peritoneal carcinomatosis. Ovarian cancer more commonly  presents in this manner. Patient does have a predominately low density right adnexal lesion measuring up to 3.5 cm, although this lesion is not typical for ovarian cancer. 2. No evidence of bowel or ureteral obstruction. 3. No other definite signs of metastatic disease. There are small indeterminate low-density hepatic lesions.   02/08/2016 Tumor Marker   Patient's tumor was tested for the following markers: CA-125 Results of the tumor marker test revealed 656.2   02/15/2016 Procedure   CT-guided core biopsy performed of omental mass just deep to the abdominal wall.   02/15/2016 Pathology Results   Omentum, biopsy, left - PAPILLARY SEROUS NEOPLASM, SEE COMMENT. Microscopic Comment There are papillary collections of largely low grade appearing cells with numerous psammoma bodies. Given the limited material it is difficult to distinguish between invasive implants of a serous borderline tumor and serous carcinoma, especially given the low grade appearance.   02/18/2016 Pathology Results   1. Omentum, resection for tumor INVASIVE IMPLANT OF HIGH GRADE SEROUS CARCINOMA 2. Uterus +/- tubes/ovaries, neoplastic HIGH GRADE SEROUS CARCINOMA INVOLVING LEFT TUBAL FIMBRIA SEROUS CARCINOMA WITH PREDOMINANT PSAMMOMA BODIES IMPLANT AT UTERUS SEROSA, BILATERAL FALLOPIAN TUBAL SEROSA, AND ANTERIOR PERITONEAL REFLECTION CERVIX: HISTOLOGICAL UNREMARKABLE ENDOMETRIUM: INACTIVE ENDOMETRIUM MYOMETRIUM: LEIOMYOMA LEFT OVARY: CYSTADENOFIBROMA RIGHT OVARY AND FALLOPIAN TUBE: HISTOLOGICAL UNREMARKABLE Microscopic Comment 2. ONCOLOGY TABLE - FALLOPIAN TUBE 1. Specimen, including laterality: Omentum, uterus, bilateral ovaries and fallopian tubes 2. Procedure: Hysterectomy, bilateral salpingo-oophorectomy and tumor debulking omentectomy 3. Lymph node sampling performed: No 4. Tumor site: uterus serosa, bilateral fallopian tubal serosa, peritoneal and omentum 5. Tumor location in fallopian tube: Left fallopian tubal  fimbria 6. Specimen integrity (intact/ruptured/disrupted): Intact 7. Tumor size (cm): multi focal  invasive omentum implant greater than 2 cm, left fallopian tube tumor 0.8 cm. 8. Histologic type: Serous carcinoma 9. Grade: 3 10. Microscopic tumor extension: uterus serosa, bilateral fallopian tubal serosa, peritoneal and omentum 11. Margins: NA 12. Lymph-Vascular invasion: identified 13. Lymph nodes: # examined: 0; # positive: NA 14. TNM: pT3c, pNx 15. FIGO Stage (based on pathologic findings, needs clinical correlation: IIIC 16. Comment: High grade serous carcinoma multifocally and extensively involves left fallopian tubal fimbria, omentum, uterus serosa, bilateral fallopian tubal serosa and peritoneal. At the left fallopian tube there are small foci of serous tubal intraepithelial carcinoma identified, so we conclude the carcinoma is fallopian tube origin   03/09/2016 Tumor Marker   Patient's tumor was tested for the following markers: CA-125 Results of the tumor marker test revealed 154.4   03/15/2016 Procedure   Technically successful right IJ power-injectable port catheter placement. Ready for routine use.   03/18/2016 - 07/13/2016 Chemotherapy   The patient had 6 cycles of carboplatin and taxol   04/14/2016 Tumor Marker   Patient's tumor was tested for the following markers: CA-125 Results of the tumor marker test revealed 36.7   04/25/2016 Genetic Testing   Patient has genetic testing done for germline mutation Results revealed patient has no mutation   04/28/2016 Tumor Marker   Patient's tumor was tested for the following markers: CA-125 Results of the tumor marker test revealed 24.6   06/21/2016 Tumor Marker   Patient's tumor was tested for the following markers: CA-125 Results of the tumor marker test revealed 16.6   08/01/2016 Tumor Marker   Patient's tumor was tested for the following markers: CA-125 Results of the tumor marker test revealed 17.2   08/05/2016 Imaging    Interval TAH-BSO. No evidence of residual pelvic mass or metastatic disease within the abdomen or pelvis. No other acute findings.    11/04/2016 Tumor Marker   Patient's tumor was tested for the following markers: CA-125 Results of the tumor marker test revealed 13.6   01/20/2017 Tumor Marker   Patient's tumor was tested for the following markers: CA-125 Results of the tumor marker test revealed 13.8   04/14/2017 Tumor Marker   Patient's tumor was tested for the following markers: CA-125 Results of the tumor marker test revealed 16.1   06/13/2017 Imaging   No acute findings.  No mass or hernia identified.  Colonic diverticulosis, without radiographic evidence of diverticulitis.  Aortic atherosclerosis.   07/03/2017 Tumor Marker   Patient's tumor was tested for the following markers: CA-125 Results of the tumor marker test revealed 18.4   09/19/2017 Tumor Marker   Patient's tumor was tested for the following markers: CA-125 Results of the tumor marker test revealed 18.5   01/23/2018 Tumor Marker   Patient's tumor was tested for the following markers: CA-125 Results of the tumor marker test revealed 35.4   01/30/2018 Imaging   1. No evidence of local fallopian tube carcinoma recurrence within the pelvis. Post hysterectomy. 2. No evidence of metastatic peritoneal disease or omental disease. No solid organ metastasis.    03/20/2018 Tumor Marker   Patient's tumor was tested for the following markers: CA-125 Results of the tumor marker test revealed 73   05/02/2018 Tumor Marker   Patient's tumor was tested for the following markers: CA-125 Results of the tumor marker test revealed 127.1   05/08/2018 Imaging   CT abdomen and pelvis 1. 7 cm left subdiaphragmatic collection of loculated fluid versus low-density soft tissue. Given the patient's history of rising CA 125,  metastatic disease considered highly likely.  2. Areas of fluid or recurrent disease identified in the right lower  quadrant adjacent to the cecum and along right lower quadrant small bowel loops.   05/14/2018 Tumor Marker   Patient's tumor was tested for the following markers: CA-125 Results of the tumor marker test revealed 166    Genetic Testing   Patient has genetic testing done for ER/PR and MMR. Results revealed patient has the following on 02/18/2016 surgical pathology: ER 20%, PR 0% MMR: normal    Genetic Testing   Patient has genetic testing done for MSI. Results revealed patient has the following: MSI stable   05/18/2018 - 12/31/2018 Chemotherapy   The patient had carboplatin and Doxil   07/16/2018 Tumor Marker   Patient's tumor was tested for the following markers: CA-125 Results of the tumor marker test revealed 28.7   08/10/2018 Imaging   CT imaging:  Decreased size of peritoneal soft tissue masses in left upper quadrant, consistent with decreased metastatic disease.  No new or progressive metastatic disease. No other acute findings.  Colonic diverticulosis, without radiographic evidence of diverticulitis.  Stable small paraumbilical ventral hernia containing transverse colon.   08/13/2018 Tumor Marker   Patient's tumor was tested for the following markers: CA-125 Results of the tumor marker test revealed 21.6   08/24/2018 Echocardiogram   LV EF: 60% -  65%   10/15/2018 Tumor Marker   Patient's tumor was tested for the following markers: CA-125 Results of the tumor marker test revealed 20.1   10/19/2018 Imaging   Ct scan of abdomen and pelvis Status post hysterectomy, bilateral salpingo-oophorectomy, and omentectomy.  Two peritoneal implants in the left upper abdomen are stable versus mildly decreased, as above. No abdominopelvic ascites.  No evidence of new/progressive metastatic disease.     11/19/2018 Tumor Marker   Patient's tumor was tested for the following markers: CA-125 Results of the tumor marker test revealed 21.7   11/23/2018 Echocardiogram   1. The  left ventricle has normal systolic function of 88-41%. The cavity size was normal. There is no increased left ventricular wall thickness. Echo evidence of impaired diastolic relaxation.  2. The right ventricle has normal systolic function. The cavity was normal. There is no increase in right ventricular wall thickness.  3. The mitral valve is normal in structure. There is mild mitral annular calcification present.  4. The tricuspid valve is normal in structure.  5. The aortic valve is tricuspid There is mild thickening of the aortic valve.  6. The pulmonic valve was normal in structure. Pulmonic valve regurgitation is mild by color flow Doppler.  7. Normal LV systolic function; mild diastolic dysfunction.   12/31/2018 Tumor Marker   Patient's tumor was tested for the following markers: CA-125 Results of the tumor marker test revealed 17.1   01/31/2019 Imaging   1. Left upper quadrant peritoneal implants described previously have decreased in the interval. No new or progressive findings in the abdomen/pelvis today. No free fluid. 2. Right paraumbilical ventral hernia contains a small knuckle of transverse colon without complicating features. 3.  Aortic Atherosclerois (ICD10-170.0)  Aortic Atherosclerosis (ICD10-I70.0).   01/31/2019 Tumor Marker   Patient's tumor was tested for the following markers: CA-125 Results of the tumor marker test revealed 18.9   02/11/2019 - 03/13/2019 Chemotherapy   The patient is taking Niraparib. She self discontinued after 1 month   02/25/2019 Tumor Marker   Patient's tumor was tested for the following markers: CA-125 Results of the tumor marker  test revealed 18.6   05/13/2019 Tumor Marker   Patient's tumor was tested for the following markers: CA-125 Results of the tumor marker test revealed 230   05/22/2019 Imaging   1. Increased size of 4.6 cm soft tissue mass in the gastrosplenic ligament, consistent with metastatic disease. 2. No other sites of  metastatic disease identified within the abdomen or pelvis. 3. Colonic diverticulosis. No radiographic evidence of diverticulitis. 4. Stable small right paraumbilical hernia containing transverse colon.   Aortic Atherosclerosis (ICD10-I70.0).   05/31/2019 -  Chemotherapy   The patient had bevacizumab and Taxol for chemotherapy treatment.     05/31/2019 Tumor Marker   Patient's tumor was tested for the following markers: CA-125 Results of the tumor marker test revealed 84     REVIEW OF SYSTEMS:   Constitutional: Denies fevers, chills or abnormal weight loss Eyes: Denies blurriness of vision Ears, nose, mouth, throat, and face: Denies mucositis or sore throat Respiratory: Denies cough, dyspnea or wheezes Cardiovascular: Denies palpitation, chest discomfort or lower extremity swelling Gastrointestinal:  Denies nausea, heartburn or change in bowel habits Skin: Denies abnormal skin rashes Lymphatics: Denies new lymphadenopathy or easy bruising Neurological:Denies numbness, tingling or new weaknesses Behavioral/Psych: Mood is stable, no new changes  All other systems were reviewed with the patient and are negative.  I have reviewed the past medical history, past surgical history, social history and family history with the patient and they are unchanged from previous note.  ALLERGIES:  has No Known Allergies.  MEDICATIONS:  Current Outpatient Medications  Medication Sig Dispense Refill  . amLODipine (NORVASC) 5 MG tablet Take 1 tablet (5 mg total) by mouth daily. 30 tablet 1  . Cholecalciferol (VITAMIN D) 2000 units CAPS Take 1 capsule by mouth daily.    Marland Kitchen ezetimibe (ZETIA) 10 MG tablet Take 10 mg by mouth daily.    Marland Kitchen lidocaine-prilocaine (EMLA) cream Apply to affected area once 30 g 3  . ondansetron (ZOFRAN) 8 MG tablet Take 1 tablet (8 mg total) by mouth 2 (two) times daily as needed (Nausea or vomiting). 30 tablet 1  . prochlorperazine (COMPAZINE) 10 MG tablet Take 1 tablet (10 mg  total) by mouth every 6 (six) hours as needed for nausea or vomiting. (Patient not taking: Reported on 05/15/2019) 30 tablet 1  . prochlorperazine (COMPAZINE) 10 MG tablet Take 1 tablet (10 mg total) by mouth every 6 (six) hours as needed (Nausea or vomiting). 30 tablet 1   No current facility-administered medications for this visit.     PHYSICAL EXAMINATION: ECOG PERFORMANCE STATUS: 1 - Symptomatic but completely ambulatory  Vitals:   06/21/19 1055  BP: (!) 117/57  Pulse: (!) 57  Resp: 18  Temp: 98.7 F (37.1 C)  SpO2: 99%   Filed Weights   06/21/19 1055  Weight: 147 lb 9.6 oz (67 kg)    GENERAL:alert, no distress and comfortable SKIN: skin color, texture, turgor are normal, no rashes or significant lesions EYES: normal, Conjunctiva are pink and non-injected, sclera clear OROPHARYNX:no exudate, no erythema and lips, buccal mucosa, and tongue normal  NECK: supple, thyroid normal size, non-tender, without nodularity LYMPH:  no palpable lymphadenopathy in the cervical, axillary or inguinal LUNGS: clear to auscultation and percussion with normal breathing effort HEART: regular rate & rhythm and no murmurs and no lower extremity edema ABDOMEN:abdomen soft, non-tender and normal bowel sounds Musculoskeletal:no cyanosis of digits and no clubbing  NEURO: alert & oriented x 3 with fluent speech, no focal motor/sensory deficits  LABORATORY DATA:  I have reviewed the data as listed    Component Value Date/Time   NA 136 06/21/2019 1028   NA 139 06/07/2017 1430   K 4.5 06/21/2019 1028   K 3.8 06/07/2017 1430   CL 103 06/21/2019 1028   CO2 24 06/21/2019 1028   CO2 27 06/07/2017 1430   GLUCOSE 100 (H) 06/21/2019 1028   GLUCOSE 124 06/07/2017 1430   BUN 14 06/21/2019 1028   BUN 14.0 06/07/2017 1430   CREATININE 0.70 06/21/2019 1028   CREATININE 0.8 06/07/2017 1430   CALCIUM 8.8 (L) 06/21/2019 1028   CALCIUM 9.3 06/07/2017 1430   PROT 6.4 (L) 06/21/2019 1028   PROT 6.6  09/30/2016 1027   ALBUMIN 3.6 06/21/2019 1028   ALBUMIN 3.5 09/30/2016 1027   AST 16 06/21/2019 1028   AST 18 09/30/2016 1027   ALT 21 06/21/2019 1028   ALT 21 09/30/2016 1027   ALKPHOS 82 06/21/2019 1028   ALKPHOS 77 09/30/2016 1027   BILITOT 0.3 06/21/2019 1028   BILITOT 0.37 09/30/2016 1027   GFRNONAA >60 06/21/2019 1028   GFRAA >60 06/21/2019 1028    No results found for: SPEP, UPEP  Lab Results  Component Value Date   WBC 2.9 (L) 06/21/2019   NEUTROABS 1.5 (L) 06/21/2019   HGB 11.2 (L) 06/21/2019   HCT 33.3 (L) 06/21/2019   MCV 92.0 06/21/2019   PLT 242 06/21/2019      Chemistry      Component Value Date/Time   NA 136 06/21/2019 1028   NA 139 06/07/2017 1430   K 4.5 06/21/2019 1028   K 3.8 06/07/2017 1430   CL 103 06/21/2019 1028   CO2 24 06/21/2019 1028   CO2 27 06/07/2017 1430   BUN 14 06/21/2019 1028   BUN 14.0 06/07/2017 1430   CREATININE 0.70 06/21/2019 1028   CREATININE 0.8 06/07/2017 1430      Component Value Date/Time   CALCIUM 8.8 (L) 06/21/2019 1028   CALCIUM 9.3 06/07/2017 1430   ALKPHOS 82 06/21/2019 1028   ALKPHOS 77 09/30/2016 1027   AST 16 06/21/2019 1028   AST 18 09/30/2016 1027   ALT 21 06/21/2019 1028   ALT 21 09/30/2016 1027   BILITOT 0.3 06/21/2019 1028   BILITOT 0.37 09/30/2016 1027       RADIOGRAPHIC STUDIES: I have personally reviewed the radiological images as listed and agreed with the findings in the report. Ct Abdomen Pelvis W Contrast  Result Date: 05/22/2019 CLINICAL DATA:  Follow-up metastatic fallopian tube carcinoma. Recently completed chemotherapy. EXAM: CT ABDOMEN AND PELVIS WITH CONTRAST TECHNIQUE: Multidetector CT imaging of the abdomen and pelvis was performed using the standard protocol following bolus administration of intravenous contrast. CONTRAST:  148m OMNIPAQUE IOHEXOL 300 MG/ML  SOLN COMPARISON:  01/31/2019 FINDINGS: Lower Chest: No acute findings. Hepatobiliary: No hepatic masses identified. Gallbladder is  unremarkable. Pancreas:  No mass or inflammatory changes. Spleen: Within normal limits in size and appearance. Adrenals/Urinary Tract: No masses identified. No evidence of hydronephrosis. Stomach/Bowel: No evidence of obstruction, inflammatory process or abnormal fluid collections. Diverticulosis is seen mainly involving the sigmoid colon, however there is no evidence of diverticulitis. A small right paraumbilical hernia is again seen containing a loop of transverse colon. No evidence of bowel strangulation or obstruction. Vascular/Lymphatic: No pathologically enlarged lymph nodes. No abdominal aortic aneurysm. Aortic atherosclerosis. Reproductive: Prior hysterectomy noted. Adnexal regions are unremarkable in appearance. Other: Left upper quadrant soft tissue mass in the gastrosplenic ligament measures 4.6 x 3.9 cm, compared  to 3.1 x 2.5 cm previously. No other peritoneal soft tissue nodules or ascites identified. Musculoskeletal:  No suspicious bone lesions identified. IMPRESSION: 1. Increased size of 4.6 cm soft tissue mass in the gastrosplenic ligament, consistent with metastatic disease. 2. No other sites of metastatic disease identified within the abdomen or pelvis. 3. Colonic diverticulosis. No radiographic evidence of diverticulitis. 4. Stable small right paraumbilical hernia containing transverse colon. Aortic Atherosclerosis (ICD10-I70.0). Electronically Signed   By: Marlaine Hind M.D.   On: 05/22/2019 14:41    All questions were answered. The patient knows to call the clinic with any problems, questions or concerns. No barriers to learning was detected.  I spent 25 minutes counseling the patient face to face. The total time spent in the appointment was 30 minutes and more than 50% was on counseling and review of test results  [name], MD 06/21/2019 11:17 AM

## 2019-06-21 NOTE — Patient Instructions (Signed)
[facility] Discharge Instructions for Patients Receiving Chemotherapy  Today you received the following chemotherapy agents: Taxol and Avastin   To help prevent nausea and vomiting after your treatment, we encourage you to take your nausea medications as directed.   If you develop nausea and vomiting that is not controlled by your nausea medication, call the clinic.   BELOW ARE SYMPTOMS THAT SHOULD BE REPORTED IMMEDIATELY:  *FEVER GREATER THAN 100.5 F  *CHILLS WITH OR WITHOUT FEVER  NAUSEA AND VOMITING THAT IS NOT CONTROLLED WITH YOUR NAUSEA MEDICATION  *UNUSUAL SHORTNESS OF BREATH  *UNUSUAL BRUISING OR BLEEDING  TENDERNESS IN MOUTH AND THROAT WITH OR WITHOUT PRESENCE OF ULCERS  *URINARY PROBLEMS  *BOWEL PROBLEMS  UNUSUAL RASH Items with * indicate a potential emergency and should be followed up as soon as possible.  Feel free to call the clinic should you have any questions or concerns. The clinic phone number is [phone].  Please show the CHEMO ALERT CARD at check-in to the Emergency Department and triage nurse.  Bevacizumab injection (Avastin) What is this medicine? BEVACIZUMAB (be va SIZ yoo mab) is a monoclonal antibody. It is used to treat many types of cancer. This medicine may be used for other purposes; ask your health care provider or pharmacist if you have questions. COMMON BRAND NAME(S): Avastin, MVASI, Zirabev What should I tell my health care provider before I take this medicine? They need to know if you have any of these conditions:  diabetes  heart disease  high blood pressure  history of coughing up blood  prior anthracycline chemotherapy (e.g., doxorubicin, daunorubicin, epirubicin)  recent or ongoing radiation therapy  recent or planning to have surgery  stroke  an unusual or allergic reaction to bevacizumab, hamster proteins, mouse proteins, other medicines, foods, dyes, or preservatives  pregnant or trying to get  pregnant  breast-feeding How should I use this medicine? This medicine is for infusion into a vein. It is given by a health care professional in a hospital or clinic setting. Talk to your pediatrician regarding the use of this medicine in children. Special care may be needed. Overdosage: If you think you have taken too much of this medicine contact a poison control center or emergency room at once. NOTE: This medicine is only for you. Do not share this medicine with others. What if I miss a dose? It is important not to miss your dose. Call your doctor or health care professional if you are unable to keep an appointment. What may interact with this medicine? Interactions are not expected. This list may not describe all possible interactions. Give your health care provider a list of all the medicines, herbs, non-prescription drugs, or dietary supplements you use. Also tell them if you smoke, drink alcohol, or use illegal drugs. Some items may interact with your medicine. What should I watch for while using this medicine? Your condition will be monitored carefully while you are receiving this medicine. You will need important blood work and urine testing done while you are taking this medicine. This medicine may increase your risk to bruise or bleed. Call your doctor or health care professional if you notice any unusual bleeding. This medicine should be started at least 28 days following major surgery and the site of the surgery should be totally healed. Check with your doctor before scheduling dental work or surgery while you are receiving this treatment. Talk to your doctor if you have recently had surgery or if you have a   wound that has not healed. Do not become pregnant while taking this medicine or for 6 months after stopping it. Women should inform their doctor if they wish to become pregnant or think they might be pregnant. There is a potential for serious side effects to an unborn child. Talk  to your health care professional or pharmacist for more information. Do not breast-feed an infant while taking this medicine and for 6 months after the last dose. This medicine has caused ovarian failure in some women. This medicine may interfere with the ability to have a child. You should talk to your doctor or health care professional if you are concerned about your fertility. What side effects may I notice from receiving this medicine? Side effects that you should report to your doctor or health care professional as soon as possible:  allergic reactions like skin rash, itching or hives, swelling of the face, lips, or tongue  chest pain or chest tightness  chills  coughing up blood  high fever  seizures  severe constipation  signs and symptoms of bleeding such as bloody or black, tarry stools; red or dark-brown urine; spitting up blood or brown material that looks like coffee grounds; red spots on the skin; unusual bruising or bleeding from the eye, gums, or nose  signs and symptoms of a blood clot such as breathing problems; chest pain; severe, sudden headache; pain, swelling, warmth in the leg  signs and symptoms of a stroke like changes in vision; confusion; trouble speaking or understanding; severe headaches; sudden numbness or weakness of the face, arm or leg; trouble walking; dizziness; loss of balance or coordination  stomach pain  sweating  swelling of legs or ankles  vomiting  weight gain Side effects that usually do not require medical attention (report to your doctor or health care professional if they continue or are bothersome):  back pain  changes in taste  decreased appetite  dry skin  nausea  tiredness This list may not describe all possible side effects. Call your doctor for medical advice about side effects. You may report side effects to FDA at 1-800-FDA-1088. Where should I keep my medicine? This drug is given in a hospital or clinic and will  not be stored at home. NOTE: This sheet is a summary. It may not cover all possible information. If you have questions about this medicine, talk to your doctor, pharmacist, or health care provider.  2020 Elsevier/Gold Standard (2016-09-30 14:33:29)  Paclitaxel injection What is this medicine? PACLITAXEL (PAK li TAX el) is a chemotherapy drug. It targets fast dividing cells, like cancer cells, and causes these cells to die. This medicine is used to treat ovarian cancer, breast cancer, lung cancer, Kaposi's sarcoma, and other cancers. This medicine may be used for other purposes; ask your health care provider or pharmacist if you have questions. COMMON BRAND NAME(S): Onxol, Taxol What should I tell my health care provider before I take this medicine? They need to know if you have any of these conditions:  history of irregular heartbeat  liver disease  low blood counts, like low white cell, platelet, or red cell counts  lung or breathing disease, like asthma  tingling of the fingers or toes, or other nerve disorder  an unusual or allergic reaction to paclitaxel, alcohol, polyoxyethylated castor oil, other chemotherapy, other medicines, foods, dyes, or preservatives  pregnant or trying to get pregnant  breast-feeding How should I use this medicine? This drug is given as an infusion into a vein.   It is administered in a hospital or clinic by a specially trained health care professional. Talk to your pediatrician regarding the use of this medicine in children. Special care may be needed. Overdosage: If you think you have taken too much of this medicine contact a poison control center or emergency room at once. NOTE: This medicine is only for you. Do not share this medicine with others. What if I miss a dose? It is important not to miss your dose. Call your doctor or health care professional if you are unable to keep an appointment. What may interact with this medicine? Do not take this  medicine with any of the following medications:  disulfiram  metronidazole This medicine may also interact with the following medications:  antiviral medicines for hepatitis, HIV or AIDS  certain antibiotics like erythromycin and clarithromycin  certain medicines for fungal infections like ketoconazole and itraconazole  certain medicines for seizures like carbamazepine, phenobarbital, phenytoin  gemfibrozil  nefazodone  rifampin  St. John's wort This list may not describe all possible interactions. Give your health care provider a list of all the medicines, herbs, non-prescription drugs, or dietary supplements you use. Also tell them if you smoke, drink alcohol, or use illegal drugs. Some items may interact with your medicine. What should I watch for while using this medicine? Your condition will be monitored carefully while you are receiving this medicine. You will need important blood work done while you are taking this medicine. This medicine can cause serious allergic reactions. To reduce your risk you will need to take other medicine(s) before treatment with this medicine. If you experience allergic reactions like skin rash, itching or hives, swelling of the face, lips, or tongue, tell your doctor or health care professional right away. In some cases, you may be given additional medicines to help with side effects. Follow all directions for their use. This drug may make you feel generally unwell. This is not uncommon, as chemotherapy can affect healthy cells as well as cancer cells. Report any side effects. Continue your course of treatment even though you feel ill unless your doctor tells you to stop. Call your doctor or health care professional for advice if you get a fever, chills or sore throat, or other symptoms of a cold or flu. Do not treat yourself. This drug decreases your body's ability to fight infections. Try to avoid being around people who are sick. This medicine may  increase your risk to bruise or bleed. Call your doctor or health care professional if you notice any unusual bleeding. Be careful brushing and flossing your teeth or using a toothpick because you may get an infection or bleed more easily. If you have any dental work done, tell your dentist you are receiving this medicine. Avoid taking products that contain aspirin, acetaminophen, ibuprofen, naproxen, or ketoprofen unless instructed by your doctor. These medicines may hide a fever. Do not become pregnant while taking this medicine. Women should inform their doctor if they wish to become pregnant or think they might be pregnant. There is a potential for serious side effects to an unborn child. Talk to your health care professional or pharmacist for more information. Do not breast-feed an infant while taking this medicine. Men are advised not to father a child while receiving this medicine. This product may contain alcohol. Ask your pharmacist or healthcare provider if this medicine contains alcohol. Be sure to tell all healthcare providers you are taking this medicine. Certain medicines, like metronidazole and disulfiram, can   cause an unpleasant reaction when taken with alcohol. The reaction includes flushing, headache, nausea, vomiting, sweating, and increased thirst. The reaction can last from 30 minutes to several hours. What side effects may I notice from receiving this medicine? Side effects that you should report to your doctor or health care professional as soon as possible:  allergic reactions like skin rash, itching or hives, swelling of the face, lips, or tongue  breathing problems  changes in vision  fast, irregular heartbeat  high or low blood pressure  mouth sores  pain, tingling, numbness in the hands or feet  signs of decreased platelets or bleeding - bruising, pinpoint red spots on the skin, black, tarry stools, blood in the urine  signs of decreased red blood cells -  unusually weak or tired, feeling faint or lightheaded, falls  signs of infection - fever or chills, cough, sore throat, pain or difficulty passing urine  signs and symptoms of liver injury like dark yellow or brown urine; general ill feeling or flu-like symptoms; light-colored stools; loss of appetite; nausea; right upper belly pain; unusually weak or tired; yellowing of the eyes or skin  swelling of the ankles, feet, hands  unusually slow heartbeat Side effects that usually do not require medical attention (report to your doctor or health care professional if they continue or are bothersome):  diarrhea  hair loss  loss of appetite  muscle or joint pain  nausea, vomiting  pain, redness, or irritation at site where injected  tiredness This list may not describe all possible side effects. Call your doctor for medical advice about side effects. You may report side effects to FDA at 1-800-FDA-1088. Where should I keep my medicine? This drug is given in a hospital or clinic and will not be stored at home. NOTE: This sheet is a summary. It may not cover all possible information. If you have questions about this medicine, talk to your doctor, pharmacist, or health care provider.  2020 Elsevier/Gold Standard (2017-06-06 13:14:55)   

## 2019-06-21 NOTE — Assessment & Plan Note (Signed)
So far, she tolerated treatment well except progressive pancytopenia I recommend minor dose adjustment We will see how she tolerate treatment this week and next week If she continues to have severe pancytopenia, I might have to modify her cycle 2-day 1 and 15 for cycle of every 28 days I will wait until her results are available next week before I schedule more appointment Her next CT imaging will be due in November Tumor markers pending

## 2019-06-21 NOTE — Assessment & Plan Note (Signed)
Her blood pressure is satisfactory We will observe closely

## 2019-06-22 LAB — CA 125: Cancer Antigen (CA) 125: 49.4 U/mL — ABNORMAL HIGH (ref 0.0–38.1)

## 2019-06-25 ENCOUNTER — Telehealth: Payer: Self-pay

## 2019-06-25 MED ORDER — METFORMIN SR 500 MG 24 HR TABLET
500 mg | ORAL_TABLET | Freq: Every day | ORAL | 3 refills | Status: DC
Start: 2019-06-25 — End: 2021-04-01

## 2019-06-25 NOTE — Telephone Encounter (Signed)
-----   Message from Ni Gorsuch, MD sent at 06/25/2019  7:59 AM EDT ----- Regarding: CA-125 is better pls let her know   

## 2019-06-25 NOTE — Telephone Encounter (Signed)
Called and given below message. She verbalized understanding. 

## 2019-06-28 ENCOUNTER — Inpatient Hospital Stay: Payer: Medicare Other

## 2019-06-28 ENCOUNTER — Telehealth: Payer: Self-pay | Admitting: Oncology

## 2019-06-28 ENCOUNTER — Other Ambulatory Visit: Payer: Self-pay

## 2019-06-28 ENCOUNTER — Other Ambulatory Visit: Payer: Self-pay | Admitting: Hematology and Oncology

## 2019-06-28 VITALS — BP 132/65 | HR 61 | Temp 98.2°F | Resp 18

## 2019-06-28 DIAGNOSIS — Z7189 Other specified counseling: Secondary | ICD-10-CM

## 2019-06-28 DIAGNOSIS — Z95828 Presence of other vascular implants and grafts: Secondary | ICD-10-CM

## 2019-06-28 DIAGNOSIS — C5702 Malignant neoplasm of left fallopian tube: Secondary | ICD-10-CM | POA: Diagnosis not present

## 2019-06-28 DIAGNOSIS — Z5112 Encounter for antineoplastic immunotherapy: Secondary | ICD-10-CM | POA: Diagnosis not present

## 2019-06-28 DIAGNOSIS — Z5111 Encounter for antineoplastic chemotherapy: Secondary | ICD-10-CM | POA: Diagnosis not present

## 2019-06-28 LAB — CBC WITH DIFFERENTIAL (CANCER CENTER ONLY)
Abs Immature Granulocytes: 0.03 10*3/uL (ref 0.00–0.07)
Basophils Absolute: 0 10*3/uL (ref 0.0–0.1)
Basophils Relative: 0 %
Eosinophils Absolute: 0.1 10*3/uL (ref 0.0–0.5)
Eosinophils Relative: 2 %
HCT: 34.2 % — ABNORMAL LOW (ref 36.0–46.0)
Hemoglobin: 11.5 g/dL — ABNORMAL LOW (ref 12.0–15.0)
Immature Granulocytes: 1 %
Lymphocytes Relative: 27 %
Lymphs Abs: 1 10*3/uL (ref 0.7–4.0)
MCH: 30.7 pg (ref 26.0–34.0)
MCHC: 33.6 g/dL (ref 30.0–36.0)
MCV: 91.2 fL (ref 80.0–100.0)
Monocytes Absolute: 0.5 10*3/uL (ref 0.1–1.0)
Monocytes Relative: 12 %
Neutro Abs: 2.3 10*3/uL (ref 1.7–7.7)
Neutrophils Relative %: 58 %
Platelet Count: 226 10*3/uL (ref 150–400)
RBC: 3.75 MIL/uL — ABNORMAL LOW (ref 3.87–5.11)
RDW: 12.4 % (ref 11.5–15.5)
WBC Count: 3.8 10*3/uL — ABNORMAL LOW (ref 4.0–10.5)
nRBC: 0 % (ref 0.0–0.2)

## 2019-06-28 LAB — CMP (CANCER CENTER ONLY)
ALT: 24 U/L (ref 0–44)
AST: 17 U/L (ref 15–41)
Albumin: 3.6 g/dL (ref 3.5–5.0)
Alkaline Phosphatase: 78 U/L (ref 38–126)
Anion gap: 6 (ref 5–15)
BUN: 13 mg/dL (ref 8–23)
CO2: 25 mmol/L (ref 22–32)
Calcium: 8.7 mg/dL — ABNORMAL LOW (ref 8.9–10.3)
Chloride: 104 mmol/L (ref 98–111)
Creatinine: 0.72 mg/dL (ref 0.44–1.00)
GFR, Est AFR Am: >60 mL/min (ref >60)
GFR, Estimated: >60 mL/min (ref >60)
Glucose, Bld: 89 mg/dL (ref 70–99)
Potassium: 4.3 mmol/L (ref 3.5–5.1)
Sodium: 135 mmol/L (ref 135–145)
Total Bilirubin: 0.3 mg/dL (ref 0.3–1.2)
Total Protein: 6.5 g/dL (ref 6.5–8.1)

## 2019-06-28 MED ORDER — SODIUM CHLORIDE 0.9 % IV SOLN
20.0000 mg | Freq: Once | INTRAVENOUS | Status: AC
  Administered 2019-06-28: 20 mg via INTRAVENOUS
  Filled 2019-06-28: qty 20

## 2019-06-28 MED ORDER — DIPHENHYDRAMINE HCL 50 MG/ML IJ SOLN
INTRAMUSCULAR | Status: AC
  Filled 2019-06-28: qty 1

## 2019-06-28 MED ORDER — SODIUM CHLORIDE 0.9 % IV SOLN
Freq: Once | INTRAVENOUS | Status: AC
  Administered 2019-06-28: 11:00:00 via INTRAVENOUS
  Filled 2019-06-28: qty 250

## 2019-06-28 MED ORDER — FAMOTIDINE IN NACL 20-0.9 MG/50ML-% IV SOLN
20.0000 mg | Freq: Once | INTRAVENOUS | Status: AC
  Administered 2019-06-28: 11:00:00 20 mg via INTRAVENOUS

## 2019-06-28 MED ORDER — SODIUM CHLORIDE 0.9% FLUSH
10.0000 mL | Freq: Once | INTRAVENOUS | Status: AC
  Administered 2019-06-28: 10 mL
  Filled 2019-06-28: qty 10

## 2019-06-28 MED ORDER — FAMOTIDINE IN NACL 20-0.9 MG/50ML-% IV SOLN
INTRAVENOUS | Status: AC
  Filled 2019-06-28: qty 50

## 2019-06-28 MED ORDER — DIPHENHYDRAMINE HCL 50 MG/ML IJ SOLN
50.0000 mg | Freq: Once | INTRAMUSCULAR | Status: AC
  Administered 2019-06-28: 50 mg via INTRAVENOUS

## 2019-06-28 MED ORDER — HEPARIN SOD (PORK) LOCK FLUSH 100 UNIT/ML IV SOLN
500.0000 [IU] | Freq: Once | INTRAVENOUS | Status: AC | PRN
  Administered 2019-06-28: 14:00:00 500 [IU]
  Filled 2019-06-28: qty 5

## 2019-06-28 MED ORDER — SODIUM CHLORIDE 0.9% FLUSH
10.0000 mL | INTRAVENOUS | Status: DC | PRN
  Administered 2019-06-28: 14:00:00 10 mL
  Filled 2019-06-28: qty 10

## 2019-06-28 MED ORDER — SODIUM CHLORIDE 0.9 % IV SOLN
64.0000 mg/m2 | Freq: Once | INTRAVENOUS | Status: AC
  Administered 2019-06-28: 108 mg via INTRAVENOUS
  Filled 2019-06-28: qty 18

## 2019-06-28 NOTE — Telephone Encounter (Signed)
I recommend avoiding all supplement because they can cause her low blood count

## 2019-06-28 NOTE — Telephone Encounter (Signed)
Jessica Johnston called and said she has been drinking herbal teas with mushrooms and tumeric and would like to try some new ones from a store on [facility].  She is interested in one that has elderberry syrup.  She is wondering if there is are any herbs that she needs to avoid while getting chemotherapy.

## 2019-06-28 NOTE — Patient Instructions (Addendum)
[facility] Discharge Instructions for Patients Receiving Chemotherapy  Today you received the following chemotherapy agents: Taxol  To help prevent nausea and vomiting after your treatment, we encourage you to take your nausea medications as directed.   If you develop nausea and vomiting that is not controlled by your nausea medication, call the clinic.   BELOW ARE SYMPTOMS THAT SHOULD BE REPORTED IMMEDIATELY:  *FEVER GREATER THAN 100.5 F  *CHILLS WITH OR WITHOUT FEVER  NAUSEA AND VOMITING THAT IS NOT CONTROLLED WITH YOUR NAUSEA MEDICATION  *UNUSUAL SHORTNESS OF BREATH  *UNUSUAL BRUISING OR BLEEDING  TENDERNESS IN MOUTH AND THROAT WITH OR WITHOUT PRESENCE OF ULCERS  *URINARY PROBLEMS  *BOWEL PROBLEMS  UNUSUAL RASH Items with * indicate a potential emergency and should be followed up as soon as possible.  Feel free to call the clinic should you have any questions or concerns. The clinic phone number is [phone].  Please show the [place] at check-in to the Emergency Department and triage nurse.   Paclitaxel injection What is this medicine? PACLITAXEL (PAK li TAX el) is a chemotherapy drug. It targets fast dividing cells, like cancer cells, and causes these cells to die. This medicine is used to treat ovarian cancer, breast cancer, lung cancer, Kaposi's sarcoma, and other cancers. This medicine may be used for other purposes; ask your health care provider or pharmacist if you have questions. COMMON BRAND NAME(S): Onxol, Taxol What should I tell my health care provider before I take this medicine? They need to know if you have any of these conditions:  history of irregular heartbeat  liver disease  low blood counts, like low white cell, platelet, or red cell counts  lung or breathing disease, like asthma  tingling of the fingers or toes, or other nerve disorder  an unusual or allergic reaction to paclitaxel, alcohol, polyoxyethylated  castor oil, other chemotherapy, other medicines, foods, dyes, or preservatives  pregnant or trying to get pregnant  breast-feeding How should I use this medicine? This drug is given as an infusion into a vein. It is administered in a hospital or clinic by a specially trained health care professional. Talk to your pediatrician regarding the use of this medicine in children. Special care may be needed. Overdosage: If you think you have taken too much of this medicine contact a poison control center or emergency room at once. NOTE: This medicine is only for you. Do not share this medicine with others. What if I miss a dose? It is important not to miss your dose. Call your doctor or health care professional if you are unable to keep an appointment. What may interact with this medicine? Do not take this medicine with any of the following medications:  disulfiram  metronidazole This medicine may also interact with the following medications:  antiviral medicines for hepatitis, HIV or AIDS  certain antibiotics like erythromycin and clarithromycin  certain medicines for fungal infections like ketoconazole and itraconazole  certain medicines for seizures like carbamazepine, phenobarbital, phenytoin  gemfibrozil  nefazodone  rifampin  St. John's wort This list may not describe all possible interactions. Give your health care provider a list of all the medicines, herbs, non-prescription drugs, or dietary supplements you use. Also tell them if you smoke, drink alcohol, or use illegal drugs. Some items may interact with your medicine. What should I watch for while using this medicine? Your condition will be monitored carefully while you are receiving this medicine. You will need important blood  work done while you are taking this medicine. This medicine can cause serious allergic reactions. To reduce your risk you will need to take other medicine(s) before treatment with this medicine. If you  experience allergic reactions like skin rash, itching or hives, swelling of the face, lips, or tongue, tell your doctor or health care professional right away. In some cases, you may be given additional medicines to help with side effects. Follow all directions for their use. This drug may make you feel generally unwell. This is not uncommon, as chemotherapy can affect healthy cells as well as cancer cells. Report any side effects. Continue your course of treatment even though you feel ill unless your doctor tells you to stop. Call your doctor or health care professional for advice if you get a fever, chills or sore throat, or other symptoms of a cold or flu. Do not treat yourself. This drug decreases your body's ability to fight infections. Try to avoid being around people who are sick. This medicine may increase your risk to bruise or bleed. Call your doctor or health care professional if you notice any unusual bleeding. Be careful brushing and flossing your teeth or using a toothpick because you may get an infection or bleed more easily. If you have any dental work done, tell your dentist you are receiving this medicine. Avoid taking products that contain aspirin, acetaminophen, ibuprofen, naproxen, or ketoprofen unless instructed by your doctor. These medicines may hide a fever. Do not become pregnant while taking this medicine. Women should inform their doctor if they wish to become pregnant or think they might be pregnant. There is a potential for serious side effects to an unborn child. Talk to your health care professional or pharmacist for more information. Do not breast-feed an infant while taking this medicine. Men are advised not to father a child while receiving this medicine. This product may contain alcohol. Ask your pharmacist or healthcare provider if this medicine contains alcohol. Be sure to tell all healthcare providers you are taking this medicine. Certain medicines, like metronidazole  and disulfiram, can cause an unpleasant reaction when taken with alcohol. The reaction includes flushing, headache, nausea, vomiting, sweating, and increased thirst. The reaction can last from 30 minutes to several hours. What side effects may I notice from receiving this medicine? Side effects that you should report to your doctor or health care professional as soon as possible:  allergic reactions like skin rash, itching or hives, swelling of the face, lips, or tongue  breathing problems  changes in vision  fast, irregular heartbeat  high or low blood pressure  mouth sores  pain, tingling, numbness in the hands or feet  signs of decreased platelets or bleeding - bruising, pinpoint red spots on the skin, black, tarry stools, blood in the urine  signs of decreased red blood cells - unusually weak or tired, feeling faint or lightheaded, falls  signs of infection - fever or chills, cough, sore throat, pain or difficulty passing urine  signs and symptoms of liver injury like dark yellow or brown urine; general ill feeling or flu-like symptoms; light-colored stools; loss of appetite; nausea; right upper belly pain; unusually weak or tired; yellowing of the eyes or skin  swelling of the ankles, feet, hands  unusually slow heartbeat Side effects that usually do not require medical attention (report to your doctor or health care professional if they continue or are bothersome):  diarrhea  hair loss  loss of appetite  muscle or joint pain  nausea, vomiting  pain, redness, or irritation at site where injected  tiredness This list may not describe all possible side effects. Call your doctor for medical advice about side effects. You may report side effects to FDA at 1-800-FDA-1088. Where should I keep my medicine? This drug is given in a hospital or clinic and will not be stored at home. NOTE: This sheet is a summary. It may not cover all possible information. If you have  questions about this medicine, talk to your doctor, pharmacist, or health care provider.  2020 Elsevier/Gold Standard (2017-06-06 13:14:55)  

## 2019-07-01 NOTE — Telephone Encounter (Signed)
Left a message for [facility] regarding message below from Dr. Alvy Bimler.

## 2019-07-08 ENCOUNTER — Other Ambulatory Visit: Payer: Medicare Other

## 2019-07-08 ENCOUNTER — Ambulatory Visit: Payer: MEDICARE | Primary: Family Medicine

## 2019-07-12 ENCOUNTER — Inpatient Hospital Stay: Payer: Medicare Other

## 2019-07-12 ENCOUNTER — Other Ambulatory Visit: Payer: Self-pay

## 2019-07-12 VITALS — BP 107/63 | HR 60 | Temp 98.2°F | Resp 20

## 2019-07-12 DIAGNOSIS — Z5111 Encounter for antineoplastic chemotherapy: Secondary | ICD-10-CM | POA: Diagnosis not present

## 2019-07-12 DIAGNOSIS — Z7189 Other specified counseling: Secondary | ICD-10-CM

## 2019-07-12 DIAGNOSIS — C5702 Malignant neoplasm of left fallopian tube: Secondary | ICD-10-CM

## 2019-07-12 DIAGNOSIS — Z5112 Encounter for antineoplastic immunotherapy: Secondary | ICD-10-CM | POA: Diagnosis not present

## 2019-07-12 DIAGNOSIS — Z95828 Presence of other vascular implants and grafts: Secondary | ICD-10-CM

## 2019-07-12 DIAGNOSIS — Z23 Encounter for immunization: Secondary | ICD-10-CM

## 2019-07-12 LAB — CMP (CANCER CENTER ONLY)
ALT: 20 U/L (ref 0–44)
AST: 17 U/L (ref 15–41)
Albumin: 3.7 g/dL (ref 3.5–5.0)
Alkaline Phosphatase: 77 U/L (ref 38–126)
Anion gap: 7 (ref 5–15)
BUN: 11 mg/dL (ref 8–23)
CO2: 25 mmol/L (ref 22–32)
Calcium: 9.1 mg/dL (ref 8.9–10.3)
Chloride: 106 mmol/L (ref 98–111)
Creatinine: 0.7 mg/dL (ref 0.44–1.00)
GFR, Est AFR Am: >60 mL/min (ref >60)
GFR, Estimated: >60 mL/min (ref >60)
Glucose, Bld: 99 mg/dL (ref 70–99)
Potassium: 4.4 mmol/L (ref 3.5–5.1)
Sodium: 138 mmol/L (ref 135–145)
Total Bilirubin: 0.4 mg/dL (ref 0.3–1.2)
Total Protein: 6.7 g/dL (ref 6.5–8.1)

## 2019-07-12 LAB — CBC WITH DIFFERENTIAL (CANCER CENTER ONLY)
Abs Immature Granulocytes: 0.01 10*3/uL (ref 0.00–0.07)
Basophils Absolute: 0 10*3/uL (ref 0.0–0.1)
Basophils Relative: 1 %
Eosinophils Absolute: 0.1 10*3/uL (ref 0.0–0.5)
Eosinophils Relative: 2 %
HCT: 34.2 % — ABNORMAL LOW (ref 36.0–46.0)
Hemoglobin: 11.5 g/dL — ABNORMAL LOW (ref 12.0–15.0)
Immature Granulocytes: 0 %
Lymphocytes Relative: 27 %
Lymphs Abs: 1 10*3/uL (ref 0.7–4.0)
MCH: 30.4 pg (ref 26.0–34.0)
MCHC: 33.6 g/dL (ref 30.0–36.0)
MCV: 90.5 fL (ref 80.0–100.0)
Monocytes Absolute: 0.6 10*3/uL (ref 0.1–1.0)
Monocytes Relative: 15 %
Neutro Abs: 2.1 10*3/uL (ref 1.7–7.7)
Neutrophils Relative %: 55 %
Platelet Count: 233 10*3/uL (ref 150–400)
RBC: 3.78 MIL/uL — ABNORMAL LOW (ref 3.87–5.11)
RDW: 13.2 % (ref 11.5–15.5)
WBC Count: 3.8 10*3/uL — ABNORMAL LOW (ref 4.0–10.5)
nRBC: 0 % (ref 0.0–0.2)

## 2019-07-12 MED ORDER — SODIUM CHLORIDE 0.9 % IV SOLN
Freq: Once | INTRAVENOUS | Status: AC
  Administered 2019-07-12: 10:00:00 via INTRAVENOUS
  Filled 2019-07-12: qty 250

## 2019-07-12 MED ORDER — SODIUM CHLORIDE 0.9% FLUSH
10.0000 mL | INTRAVENOUS | Status: DC | PRN
  Administered 2019-07-12: 13:00:00 10 mL
  Filled 2019-07-12: qty 10

## 2019-07-12 MED ORDER — INFLUENZA VAC A&B SA ADJ QUAD 0.5 ML IM PRSY: 0.5000 mL | PREFILLED_SYRINGE | Freq: Once | INTRAMUSCULAR | Status: DC

## 2019-07-12 MED ORDER — SODIUM CHLORIDE 0.9 % IV SOLN
20.0000 mg | Freq: Once | INTRAVENOUS | Status: AC
  Administered 2019-07-12: 11:00:00 20 mg via INTRAVENOUS
  Filled 2019-07-12: qty 20

## 2019-07-12 MED ORDER — SODIUM CHLORIDE 0.9% FLUSH
10.0000 mL | Freq: Once | INTRAVENOUS | Status: AC
  Administered 2019-07-12: 09:00:00 10 mL
  Filled 2019-07-12: qty 10

## 2019-07-12 MED ORDER — DIPHENHYDRAMINE HCL 50 MG/ML IJ SOLN
INTRAMUSCULAR | Status: AC
  Filled 2019-07-12: qty 1

## 2019-07-12 MED ORDER — FAMOTIDINE IN NACL 20-0.9 MG/50ML-% IV SOLN
INTRAVENOUS | Status: AC
  Filled 2019-07-12: qty 50

## 2019-07-12 MED ORDER — DIPHENHYDRAMINE HCL 50 MG/ML IJ SOLN
50.0000 mg | Freq: Once | INTRAMUSCULAR | Status: AC
  Administered 2019-07-12: 10:00:00 50 mg via INTRAVENOUS

## 2019-07-12 MED ORDER — INFLUENZA VAC A&B SA ADJ QUAD 0.5 ML IM PRSY
PREFILLED_SYRINGE | INTRAMUSCULAR | Status: AC
  Filled 2019-07-12: qty 0.5

## 2019-07-12 MED ORDER — SODIUM CHLORIDE 0.9 % IV SOLN
64.0000 mg/m2 | Freq: Once | INTRAVENOUS | Status: AC
  Administered 2019-07-12: 12:00:00 108 mg via INTRAVENOUS
  Filled 2019-07-12: qty 18

## 2019-07-12 MED ORDER — SODIUM CHLORIDE 0.9 % IV SOLN
700.0000 mg | Freq: Once | INTRAVENOUS | Status: AC
  Administered 2019-07-12: 11:00:00 700 mg via INTRAVENOUS
  Filled 2019-07-12: qty 16

## 2019-07-12 MED ORDER — FAMOTIDINE IN NACL 20-0.9 MG/50ML-% IV SOLN
20.0000 mg | Freq: Once | INTRAVENOUS | Status: AC
  Administered 2019-07-12: 10:00:00 20 mg via INTRAVENOUS

## 2019-07-12 MED ORDER — HEPARIN SOD (PORK) LOCK FLUSH 100 UNIT/ML IV SOLN
500.0000 [IU] | Freq: Once | INTRAVENOUS | Status: AC | PRN
  Administered 2019-07-12: 500 [IU]
  Filled 2019-07-12: qty 5

## 2019-07-12 NOTE — Patient Instructions (Signed)
[facility] Discharge Instructions for Patients Receiving Chemotherapy  Today you received the following chemotherapy agents Bevacizumab (AVASTIN) & Paclitaxel (TAXOL).  To help prevent nausea and vomiting after your treatment, we encourage you to take your nausea medication as prescribed.   If you develop nausea and vomiting that is not controlled by your nausea medication, call the clinic.   BELOW ARE SYMPTOMS THAT SHOULD BE REPORTED IMMEDIATELY:  *FEVER GREATER THAN 100.5 F  *CHILLS WITH OR WITHOUT FEVER  NAUSEA AND VOMITING THAT IS NOT CONTROLLED WITH YOUR NAUSEA MEDICATION  *UNUSUAL SHORTNESS OF BREATH  *UNUSUAL BRUISING OR BLEEDING  TENDERNESS IN MOUTH AND THROAT WITH OR WITHOUT PRESENCE OF ULCERS  *URINARY PROBLEMS  *BOWEL PROBLEMS  UNUSUAL RASH Items with * indicate a potential emergency and should be followed up as soon as possible.  Feel free to call the clinic should you have any questions or concerns. The clinic phone number is [phone].  Please show the [facility] at check-in to the Emergency Department and triage nurse.  Coronavirus (COVID-19) Are you at risk?  Are you at risk for the Coronavirus (COVID-19)?  To be considered HIGH RISK for Coronavirus (COVID-19), you have to meet the following criteria:  . Traveled to [place], [place], [place], [place] or [place]; or in the [place] to [place], [place], [place], or [place]; and have fever, cough, and shortness of breath within the last 2 weeks of travel OR . Been in close contact with a person diagnosed with COVID-19 within the last 2 weeks and have fever, cough, and shortness of breath . IF YOU DO NOT MEET THESE CRITERIA, YOU ARE CONSIDERED LOW RISK FOR COVID-19.  What to do if you are HIGH RISK for COVID-19?  Marland Kitchen If you are having a medical emergency, call 911. . Seek medical care right away. Before you go to a doctor's office, urgent care or emergency department, call  ahead and tell them about your recent travel, contact with someone diagnosed with COVID-19, and your symptoms. You should receive instructions from your physician's office regarding next steps of care.  . When you arrive at healthcare provider, tell the healthcare staff immediately you have returned from visiting [place], [place], [place], [place] or [place]; or traveled in the [place] to [place], [place], [place], or [place]; in the last two weeks or you have been in close contact with a person diagnosed with COVID-19 in the last 2 weeks.   . Tell the health care staff about your symptoms: fever, cough and shortness of breath. . After you have been seen by a medical provider, you will be either: o Tested for (COVID-19) and discharged home on quarantine except to seek medical care if symptoms worsen, and asked to  - Stay home and avoid contact with others until you get your results (4-5 days)  - Avoid travel on public transportation if possible (such as bus, train, or airplane) or o Sent to the Emergency Department by EMS for evaluation, COVID-19 testing, and possible admission depending on your condition and test results.  What to do if you are LOW RISK for COVID-19?  Reduce your risk of any infection by using the same precautions used for avoiding the common cold or flu:  Marland Kitchen Wash your hands often with soap and warm water for at least 20 seconds.  If soap and water are not readily available, use an alcohol-based hand sanitizer with at least 60% alcohol.  Marland Kitchen  If coughing or sneezing, cover your mouth and nose by coughing or sneezing into the elbow areas of your shirt or coat, into a tissue or into your sleeve (not your hands). . Avoid shaking hands with others and consider head nods or verbal greetings only. . Avoid touching your eyes, nose, or mouth with unwashed hands.  . Avoid close contact with people who are sick. . Avoid places or events with large numbers of people in one location,  like concerts or sporting events. . Carefully consider travel plans you have or are making. . If you are planning any travel outside or inside the [place], visit the CDC's Travelers' Health webpage for the latest health notices. . If you have some symptoms but not all symptoms, continue to monitor at home and seek medical attention if your symptoms worsen. . If you are having a medical emergency, call 911.   [facility] / e-Visit: [email or web address]         [facility]: [facility]: [phone]                   [facility]: [phone]

## 2019-07-12 NOTE — Patient Instructions (Signed)

## 2019-07-16 ENCOUNTER — Ambulatory Visit: Admit: 2019-07-16 | Discharge: 2019-07-16 | Payer: MEDICARE | Attending: Family Medicine | Primary: Family Medicine

## 2019-07-16 ENCOUNTER — Ambulatory Visit: Attending: Family Medicine | Primary: Family Medicine

## 2019-07-16 DIAGNOSIS — E119 Type 2 diabetes mellitus without complications: Secondary | ICD-10-CM

## 2019-07-16 DIAGNOSIS — R5383 Other fatigue: Secondary | ICD-10-CM

## 2019-07-16 NOTE — Progress Notes (Signed)
Diabetes remains well controlled.  Triglycerides have come down a little too.  Thyroid level is normal. Keep up the good work and we can recheck in 6 months.

## 2019-07-16 NOTE — Progress Notes (Signed)
Chief Complaint   Patient presents with   . Annual Wellness Visit     Patient presents in office today for AMW visit.  Would like to get a flu shot.  No concerns.    Pt / caregiver given opportunity to review vaccine information sheet prior to vaccine administration. Opportunity given for questions and concerns. No questions or concerns at this time.    1. Have you been to the ER, urgent care clinic since your last visit?  Hospitalized since your last visit?No    2. Have you seen or consulted any other health care providers outside of the [facility] since your last visit?  Include any pap smears or colon screening. No    Learning Assessment 05/06/2013   PRIMARY LEARNER Patient   HIGHEST LEVEL OF EDUCATION - PRIMARY LEARNER  SOME COLLEGE   BARRIERS PRIMARY LEARNER NONE   CO-LEARNER CAREGIVER No   PRIMARY LANGUAGE ENGLISH   INTERPRETER NEED No   LEARNER PREFERENCE PRIMARY READING     DEMONSTRATION     LISTENING   LEARNING SPECIAL TOPICS n/a   ANSWERED BY patient   RELATIONSHIP SELF

## 2019-07-16 NOTE — Progress Notes (Signed)
Progress Note    she is a 78 y.o. year old female who presents for evalution.    Subjective:     Pt is concerned about possible low thyroid.  States she has hair thinning but no cold intolerance.  Fatigue as well.  Has had increased stress at home with maintenance needed.    Pt with NIDDM last A1C was 6.6.  On metformin.  On 50k vitamin D weekly.  On lipitor 40 for her chol and tolerates this.  Last LDL not calculated due to trigs elevated.    Pt is on effexor for mild depression and hot flashes.  Does not feel depressed anymoreand hot flashes are not bothering her.  She will discuss with oncology in October about coming off.    Reviewed PmHx, RxHx, FmHx, SocHx, AllgHx and updated and dated in the chart.    Review of Systems - negative except as listed above in the HPI    Objective:     Vitals:    07/16/19 1338   BP: 112/72   Pulse: 82   Resp: 18   Temp: 98.4 ??F (36.9 ??C)   TempSrc: Oral   SpO2: 94%   Weight: 170 lb (77.1 kg)   Height: 5\' 3"  (1.6 m)       Current Outpatient Medications   Medication Sig   ??? metFORMIN ER (GLUCOPHAGE XR) 500 mg tablet Take 1 Tab by mouth daily (with dinner).   ??? ergocalciferol (Vitamin D2) 1,250 mcg (50,000 unit) capsule TAKE 1 CAPSULE EVERY 7 DAYS   ??? olmesartan-hydroCHLOROthiazide (BENICAR HCT) 20-12.5 mg per tablet Take 1 Tab by mouth daily.   ??? atorvastatin (LIPITOR) 40 mg tablet TAKE 1 TABLET DAILY BEFORE BREAKFAST   ??? venlafaxine-SR (EFFEXOR-XR) 75 mg capsule TAKE 1 CAPSULE DAILY   ??? alendronate (FOSAMAX) 35 mg tablet TAKE 1 TABLET EVERY 7 DAYS   ??? brimonidine-timolol (COMBIGAN) 0.2-0.5 % drop ophthalmic solution Administer 1 Drop to both eyes every twelve (12) hours.   ??? aspirin 81 mg chewable tablet Take 81 mg by mouth daily.   ??? mupirocin (BACTROBAN) 2 % ointment Apply  to affected area four (4) times daily.   ??? anastrozole (ARIMIDEX) 1 mg tablet Take 1 mg by mouth daily.   ??? Cetirizine (ZYRTEC) 10 mg cap Take  by mouth as needed.   ??? krill-om-3-dha-epa-phospho-ast  (MEGARED OMEGA-3 KRILL OIL) 1,000-230-60 mg cap Take 1 Tab by mouth two (2) times a day.     No current facility-administered medications for this visit.        Physical Examination: General appearance - alert, well appearing, and in no distress  Mental status - alert, oriented to person, place, and time  Chest - clear to auscultation, no wheezes, rales or rhonchi, symmetric air entry  Heart - normal rate, regular rhythm, normal S1, S2, no murmurs, rubs, clicks or gallops  Extremities - peripheral pulses normal, no pedal edema, no clubbing or cyanosis      Assessment/ Plan:   Diagnoses and all orders for this visit:    1. Low energy  -     TSH 3RD GENERATION; Future    2. Hair thinning  -     TSH 3RD GENERATION; Future    3. Controlled type 2 diabetes mellitus without complication, without long-term current use of insulin (HCC)  -     METABOLIC PANEL, COMPREHENSIVE; Future  -     LIPID PANEL; Future  -     HEMOGLOBIN A1C WITH EAG;  Future  -     MICROALBUMIN, UR, RAND W/ MICROALB/CREAT RATIO; Future    4. Essential hypertension  -     METABOLIC PANEL, COMPREHENSIVE; Future    5. Mixed hyperlipidemia  -     METABOLIC PANEL, COMPREHENSIVE; Future  -     LIPID PANEL; Future    6. Hypovitaminosis D  -     VITAMIN D, 25 HYDROXY; Future    7. Mild episode of recurrent major depressive disorder ([place])    8. Medicare annual wellness visit, subsequent    9. Screening for depression  -     [place]    10. Encounter for immunization  -     FLU (FLUAD QUAD INFLUENZA VACCINE,QUAD,ADJUVANTED)       Follow-up and Dispositions    ?? Return if symptoms worsen or fail to improve.           I have discussed the diagnosis with the patient and the intended plan as seen in the above orders.  The patient has received an after-visit summary and questions were answered concerning future plans. Pt conveyed understanding of plan.    Medication Side Effects and Warnings were discussed with patient      Esau Grew, DO      This  is the Subsequent Medicare Annual Wellness Exam, performed 12 months or more after the Initial AWV or the last Subsequent AWV    I have reviewed the patient's medical history in detail and updated the computerized patient record.     History     Patient Active Problem List   Diagnosis Code   ??? Breast cancer, right ([place]) C50.911   ??? Postmenopausal Z78.0   ??? Hypovitaminosis D E55.9   ??? Essential hypertension I10   ??? Mixed hyperlipidemia E78.2   ??? Elevated fasting glucose R73.01   ??? Controlled type 2 diabetes mellitus without complication, without long-term current use of insulin (HCC) E11.9     Past Medical History:   Diagnosis Date   ??? Breast CA ([place]) 2014    Right - Lumpectomy    ??? Diabetes ([place])    ??? Glaucoma    ??? Hx of mammogram 02/05/2016    Negative per pt. Sees Dr. Laurena Bering    ??? Hyperlipemia    ??? Hypertension    ??? 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 Other ill-defined conditions(799.89) 2016    Mild URI x 4 months (allergies)    ??? Radiation therapy complication 7412   ??? Routine Papanicolaou smear 1996    Negative per pt.    ??? Skin cancer     Basal cell on left side of nose and upper lip      Past Surgical History:   Procedure Laterality Date   ??? BREAST SURGERY PROCEDURE UNLISTED Right 2014    Right breast lumpectomy   ??? HX BREAST BIOPSY Left     x 2 negative biopsies    ??? HX BREAST LUMPECTOMY Right 2014   ??? HX CATARACT REMOVAL Bilateral     w/ IOL implants   ??? HX TAH AND BSO  1997    for rapidly growing fibroid ([facility]) - benign.  Dr. Oran Rein.   ??? HX TUBAL LIGATION  1972     Current Outpatient Medications   Medication Sig Dispense Refill   ??? metFORMIN ER (GLUCOPHAGE XR) 500 mg tablet Take 1 Tab by mouth daily (with dinner). 90 Tab 3   ???  ergocalciferol (Vitamin D2) 1,250 mcg (50,000 unit) capsule TAKE 1 CAPSULE EVERY 7 DAYS 12 Cap 3   ??? olmesartan-hydroCHLOROthiazide (BENICAR HCT) 20-12.5 mg per tablet Take 1 Tab by mouth daily. 90 Tab 3   ??? atorvastatin (LIPITOR) 40 mg tablet TAKE 1 TABLET DAILY  BEFORE BREAKFAST 90 Tab 3   ??? venlafaxine-SR (EFFEXOR-XR) 75 mg capsule TAKE 1 CAPSULE DAILY 90 Cap 3   ??? alendronate (FOSAMAX) 35 mg tablet TAKE 1 TABLET EVERY 7 DAYS 12 Tab 4   ??? brimonidine-timolol (COMBIGAN) 0.2-0.5 % drop ophthalmic solution Administer 1 Drop to both eyes every twelve (12) hours.     ??? aspirin 81 mg chewable tablet Take 81 mg by mouth daily.     ??? mupirocin (BACTROBAN) 2 % ointment Apply  to affected area four (4) times daily. 22 g 0   ??? anastrozole (ARIMIDEX) 1 mg tablet Take 1 mg by mouth daily. 90 Tab 1   ??? Cetirizine (ZYRTEC) 10 mg cap Take  by mouth as needed.     ??? krill-om-3-dha-epa-phospho-ast (MEGARED OMEGA-3 KRILL OIL) 1,000-230-60 mg cap Take 1 Tab by mouth two (2) times a day.       Allergies   Allergen Reactions   ??? Shampoo & Body Wash [Skin Cleanser,General] Itching     Paul mitchell       Family History   Problem Relation Age of Onset   ??? Cancer Mother 51        Ovarian - Passed away in 01/18/65   ??? Ovarian Cancer Mother 50   ??? Other Son 80        Pancreatitis    ??? Cancer Paternal Aunt 62        Pancreatic     Social History     Tobacco Use   ??? Smoking status: Former Smoker     Packs/day: 1.00     Years: 30.00     Pack years: 30.00     Last attempt to quit: 04/17/1996     Years since quitting: 23.2   ??? Smokeless tobacco: Never Used   ??? Tobacco comment: Never used vapor or e-cigs    Substance Use Topics   ??? Alcohol use: Yes     Alcohol/week: 11.7 standard drinks     Types: 14 Standard drinks or equivalent per week     Comment: 1 gin and tonics per day       Depression Risk Factor Screening:     3 most recent PHQ Screens 07/16/2019   PHQ Not Done -   Little interest or pleasure in doing things Not at all   Feeling down, depressed, irritable, or hopeless Not at all   Total Score PHQ 2 0       Alcohol Risk Screen   Do you average more than 1 drink per night or more than 7 drinks a week:  No    On any one occasion in the past three months have you have had more than 3 drinks containing  alcohol:  No        Functional Ability and Level of Safety:   Hearing: Hearing is good.     Activities of Daily Living:  The home contains: grab bars  Patient does total self care     Ambulation: with no difficulty     Fall Risk:  Fall Risk Assessment, last 12 mths 07/16/2019   Able to walk? Yes   Fall in past 12 months? Yes   Fall with  injury? No   Number of falls in past 12 months 1   Fall Risk Score 1     Abuse Screen:  Patient is not abused       Cognitive Screening   Has your family/caregiver stated any concerns about your memory: no     Cognitive Screening: Normal - Mini Cog Test    Patient Care Team   Patient Care Team:  Esau Grew, DO as PCP - General (Family Medicine)  Esau Grew, DO as PCP - [facility] Empaneled Provider  Joyce Gross, MD (Hematology and Oncology)  Lyndel Safe, MD as Physician (Radiation Oncology)    Assessment/Plan   Education and counseling provided:  Are appropriate based on today's review and evaluation    Diagnoses and all orders for this visit:    1. Low energy  -     TSH 3RD GENERATION; Future    2. Hair thinning  -     TSH 3RD GENERATION; Future    3. Controlled type 2 diabetes mellitus without complication, without long-term current use of insulin (HCC)  -     METABOLIC PANEL, COMPREHENSIVE; Future  -     LIPID PANEL; Future  -     HEMOGLOBIN A1C WITH EAG; Future  -     MICROALBUMIN, UR, RAND W/ MICROALB/CREAT RATIO; Future    4. Essential hypertension  -     METABOLIC PANEL, COMPREHENSIVE; Future    5. Mixed hyperlipidemia  -     METABOLIC PANEL, COMPREHENSIVE; Future  -     LIPID PANEL; Future    6. Hypovitaminosis D  -     VITAMIN D, 25 HYDROXY; Future    7. Mild episode of recurrent major depressive disorder ([place])    8. Medicare annual wellness visit, subsequent    9. Screening for depression  -     [place]    10. Encounter for immunization  -     FLU (FLUAD QUAD INFLUENZA VACCINE,QUAD,ADJUVANTED)        Health Maintenance Due   Topic Date Due   ??? Eye  Exam Retinal or Dilated  09/09/1951   ??? GLAUCOMA SCREENING Q2Y  03/04/2018   ??? MICROALBUMIN Q1  03/07/2019   ??? Flu Vaccine (1) 06/18/2019     I have discussed the diagnosis with the patient and the intended plan as seen in the above orders.  The patient has received an after-visit summary and questions were answered concerning future plans. Pt conveyed understanding of plan.      Dr Esau Grew

## 2019-07-16 NOTE — Progress Notes (Signed)
Progress Note    she is a 78 y.o. year old female who presents for evalution.    Subjective:     Pt is concerned about possible low thyroid.  States she has hair thinning but no cold intolerance.  Fatigue as well.  Has had increased stress at home with maintenance needed.    Pt with NIDDM last A1C was 6.6.  On metformin.  On 50k vitamin D weekly.  On lipitor 40 for her chol and tolerates this.  Last LDL not calculated due to trigs elevated.    Pt is on effexor for mild depression and hot flashes.  Does not feel depressed anymoreand hot flashes are not bothering her.  She will discuss with oncology in October about coming off.    Reviewed PmHx, RxHx, FmHx, SocHx, AllgHx and updated and dated in the chart.    Review of Systems - negative except as listed above in the HPI    Objective:     Vitals:    07/16/19 1338   BP: 112/72   Pulse: 82   Resp: 18   Temp: 98.4 ??F (36.9 ??C)   TempSrc: Oral   SpO2: 94%   Weight: 170 lb (77.1 kg)   Height: 5\' 3"  (1.6 m)       Current Outpatient Medications   Medication Sig   ??? metFORMIN ER (GLUCOPHAGE XR) 500 mg tablet Take 1 Tab by mouth daily (with dinner).   ??? ergocalciferol (Vitamin D2) 1,250 mcg (50,000 unit) capsule TAKE 1 CAPSULE EVERY 7 DAYS   ??? olmesartan-hydroCHLOROthiazide (BENICAR HCT) 20-12.5 mg per tablet Take 1 Tab by mouth daily.   ??? atorvastatin (LIPITOR) 40 mg tablet TAKE 1 TABLET DAILY BEFORE BREAKFAST   ??? venlafaxine-SR (EFFEXOR-XR) 75 mg capsule TAKE 1 CAPSULE DAILY   ??? alendronate (FOSAMAX) 35 mg tablet TAKE 1 TABLET EVERY 7 DAYS   ??? brimonidine-timolol (COMBIGAN) 0.2-0.5 % drop ophthalmic solution Administer 1 Drop to both eyes every twelve (12) hours.   ??? aspirin 81 mg chewable tablet Take 81 mg by mouth daily.   ??? mupirocin (BACTROBAN) 2 % ointment Apply  to affected area four (4) times daily.   ??? anastrozole (ARIMIDEX) 1 mg tablet Take 1 mg by mouth daily.   ??? Cetirizine (ZYRTEC) 10 mg cap Take  by mouth as needed.    ??? krill-om-3-dha-epa-phospho-ast (MEGARED OMEGA-3 KRILL OIL) 1,000-230-60 mg cap Take 1 Tab by mouth two (2) times a day.     No current facility-administered medications for this visit.        Physical Examination: General appearance - alert, well appearing, and in no distress  Mental status - alert, oriented to person, place, and time  Chest - clear to auscultation, no wheezes, rales or rhonchi, symmetric air entry  Heart - normal rate, regular rhythm, normal S1, S2, no murmurs, rubs, clicks or gallops  Extremities - peripheral pulses normal, no pedal edema, no clubbing or cyanosis      Assessment/ Plan:   Diagnoses and all orders for this visit:    1. Low energy  -     TSH 3RD GENERATION; Future    2. Hair thinning  -     TSH 3RD GENERATION; Future    3. Controlled type 2 diabetes mellitus without complication, without long-term current use of insulin (HCC)  -     METABOLIC PANEL, COMPREHENSIVE; Future  -     LIPID PANEL; Future  -     HEMOGLOBIN A1C WITH EAG;  Future  -     MICROALBUMIN, UR, RAND W/ MICROALB/CREAT RATIO; Future    4. Essential hypertension  -     METABOLIC PANEL, COMPREHENSIVE; Future    5. Mixed hyperlipidemia  -     METABOLIC PANEL, COMPREHENSIVE; Future  -     LIPID PANEL; Future    6. Hypovitaminosis D  -     VITAMIN D, 25 HYDROXY; Future    7. Mild episode of recurrent major depressive disorder ([place])    8. Medicare annual wellness visit, subsequent    9. Screening for depression  -     [place]    10. Encounter for immunization  -     FLU (FLUAD QUAD INFLUENZA VACCINE,QUAD,ADJUVANTED)       Follow-up and Dispositions    ?? Return if symptoms worsen or fail to improve.           I have discussed the diagnosis with the patient and the intended plan as seen in the above orders.  The patient has received an after-visit summary and questions were answered concerning future plans. Pt conveyed understanding of plan.    Medication Side Effects and Warnings were discussed with patient       Esau Grew, DO      This is the Subsequent Medicare Annual Wellness Exam, performed 12 months or more after the Initial AWV or the last Subsequent AWV    I have reviewed the patient's medical history in detail and updated the computerized patient record.     History     Patient Active Problem List   Diagnosis Code   ??? Breast cancer, right ([place]) C50.911   ??? Postmenopausal Z78.0   ??? Hypovitaminosis D E55.9   ??? Essential hypertension I10   ??? Mixed hyperlipidemia E78.2   ??? Elevated fasting glucose R73.01   ??? Controlled type 2 diabetes mellitus without complication, without long-term current use of insulin (HCC) E11.9     Past Medical History:   Diagnosis Date   ??? Breast CA ([place]) 2014    Right - Lumpectomy    ??? Diabetes ([place])    ??? Glaucoma    ??? Hx of mammogram 02/05/2016    Negative per pt. Sees Dr. Laurena Bering    ??? Hyperlipemia    ??? Hypertension    ??? 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 Other ill-defined conditions(799.89) 2016    Mild URI x 4 months (allergies)    ??? Radiation therapy complication 6160   ??? Routine Papanicolaou smear 1996    Negative per pt.    ??? Skin cancer     Basal cell on left side of nose and upper lip      Past Surgical History:   Procedure Laterality Date   ??? BREAST SURGERY PROCEDURE UNLISTED Right 2014    Right breast lumpectomy   ??? HX BREAST BIOPSY Left     x 2 negative biopsies    ??? HX BREAST LUMPECTOMY Right 2014   ??? HX CATARACT REMOVAL Bilateral     w/ IOL implants   ??? HX TAH AND BSO  1997    for rapidly growing fibroid ([facility]) - benign.  Dr. Oran Rein.   ??? HX TUBAL LIGATION  1972     Current Outpatient Medications   Medication Sig Dispense Refill   ??? metFORMIN ER (GLUCOPHAGE XR) 500 mg tablet Take 1 Tab by mouth daily (with dinner). 90 Tab 3   ???  ergocalciferol (Vitamin D2) 1,250 mcg (50,000 unit) capsule TAKE 1 CAPSULE EVERY 7 DAYS 12 Cap 3   ??? olmesartan-hydroCHLOROthiazide (BENICAR HCT) 20-12.5 mg per tablet Take 1 Tab by mouth daily. 90 Tab 3    ??? atorvastatin (LIPITOR) 40 mg tablet TAKE 1 TABLET DAILY BEFORE BREAKFAST 90 Tab 3   ??? venlafaxine-SR (EFFEXOR-XR) 75 mg capsule TAKE 1 CAPSULE DAILY 90 Cap 3   ??? alendronate (FOSAMAX) 35 mg tablet TAKE 1 TABLET EVERY 7 DAYS 12 Tab 4   ??? brimonidine-timolol (COMBIGAN) 0.2-0.5 % drop ophthalmic solution Administer 1 Drop to both eyes every twelve (12) hours.     ??? aspirin 81 mg chewable tablet Take 81 mg by mouth daily.     ??? mupirocin (BACTROBAN) 2 % ointment Apply  to affected area four (4) times daily. 22 g 0   ??? anastrozole (ARIMIDEX) 1 mg tablet Take 1 mg by mouth daily. 90 Tab 1   ??? Cetirizine (ZYRTEC) 10 mg cap Take  by mouth as needed.     ??? krill-om-3-dha-epa-phospho-ast (MEGARED OMEGA-3 KRILL OIL) 1,000-230-60 mg cap Take 1 Tab by mouth two (2) times a day.       Allergies   Allergen Reactions   ??? Shampoo & Body Wash [Skin Cleanser,General] Itching     Paul mitchell       Family History   Problem Relation Age of Onset   ??? Cancer Mother 60        Ovarian - Passed away in 1964/12/29   ??? Ovarian Cancer Mother 70   ??? Other Son 59        Pancreatitis    ??? Cancer Paternal Aunt 66        Pancreatic     Social History     Tobacco Use   ??? Smoking status: Former Smoker     Packs/day: 1.00     Years: 30.00     Pack years: 30.00     Last attempt to quit: 04/17/1996     Years since quitting: 23.2   ??? Smokeless tobacco: Never Used   ??? Tobacco comment: Never used vapor or e-cigs    Substance Use Topics   ??? Alcohol use: Yes     Alcohol/week: 11.7 standard drinks     Types: 14 Standard drinks or equivalent per week     Comment: 1 gin and tonics per day       Depression Risk Factor Screening:     3 most recent PHQ Screens 07/16/2019   PHQ Not Done -   Little interest or pleasure in doing things Not at all   Feeling down, depressed, irritable, or hopeless Not at all   Total Score PHQ 2 0       Alcohol Risk Screen   Do you average more than 1 drink per night or more than 7 drinks a week:  No     On any one occasion in the past three months have you have had more than 3 drinks containing alcohol:  No        Functional Ability and Level of Safety:   Hearing: Hearing is good.     Activities of Daily Living:  The home contains: grab bars  Patient does total self care     Ambulation: with no difficulty     Fall Risk:  Fall Risk Assessment, last 12 mths 07/16/2019   Able to walk? Yes   Fall in past 12 months? Yes   Fall with  injury? No   Number of falls in past 12 months 1   Fall Risk Score 1     Abuse Screen:  Patient is not abused       Cognitive Screening   Has your family/caregiver stated any concerns about your memory: no     Cognitive Screening: Normal - Mini Cog Test    Patient Care Team   Patient Care Team:  Esau Grew, DO as PCP - General (Family Medicine)  Esau Grew, DO as PCP - [facility] Empaneled Provider  Joyce Gross, MD (Hematology and Oncology)  Lyndel Safe, MD as Physician (Radiation Oncology)    Assessment/Plan   Education and counseling provided:  Are appropriate based on today's review and evaluation    Diagnoses and all orders for this visit:    1. Low energy  -     TSH 3RD GENERATION; Future    2. Hair thinning  -     TSH 3RD GENERATION; Future    3. Controlled type 2 diabetes mellitus without complication, without long-term current use of insulin (HCC)  -     METABOLIC PANEL, COMPREHENSIVE; Future  -     LIPID PANEL; Future  -     HEMOGLOBIN A1C WITH EAG; Future  -     MICROALBUMIN, UR, RAND W/ MICROALB/CREAT RATIO; Future    4. Essential hypertension  -     METABOLIC PANEL, COMPREHENSIVE; Future    5. Mixed hyperlipidemia  -     METABOLIC PANEL, COMPREHENSIVE; Future  -     LIPID PANEL; Future    6. Hypovitaminosis D  -     VITAMIN D, 25 HYDROXY; Future    7. Mild episode of recurrent major depressive disorder ([place])    8. Medicare annual wellness visit, subsequent    9. Screening for depression  -     [place]    10. Encounter for immunization   -     FLU (FLUAD QUAD INFLUENZA VACCINE,QUAD,ADJUVANTED)        Health Maintenance Due   Topic Date Due   ??? Eye Exam Retinal or Dilated  09/09/1951   ??? GLAUCOMA SCREENING Q2Y  03/04/2018   ??? MICROALBUMIN Q1  03/07/2019   ??? Flu Vaccine (1) 06/18/2019     I have discussed the diagnosis with the patient and the intended plan as seen in the above orders.  The patient has received an after-visit summary and questions were answered concerning future plans. Pt conveyed understanding of plan.      Dr Esau Grew

## 2019-07-16 NOTE — Progress Notes (Signed)
Chief Complaint   Patient presents with   ??? Annual Wellness Visit     Patient presents in office today for AMW visit.  Would like to get a flu shot.  No concerns.    Pt / caregiver given opportunity to review vaccine information sheet prior to vaccine administration. Opportunity given for questions and concerns. No questions or concerns at this time.    1. Have you been to the ER, urgent care clinic since your last visit?  Hospitalized since your last visit?No    2. Have you seen or consulted any other health care providers outside of the [facility] since your last visit?  Include any pap smears or colon screening. No    Learning Assessment 05/06/2013   PRIMARY LEARNER Patient   HIGHEST LEVEL OF EDUCATION - PRIMARY LEARNER  SOME COLLEGE   BARRIERS PRIMARY LEARNER NONE   CO-LEARNER CAREGIVER No   PRIMARY LANGUAGE ENGLISH   INTERPRETER NEED No   LEARNER PREFERENCE PRIMARY READING     DEMONSTRATION     LISTENING   LEARNING SPECIAL TOPICS n/a   ANSWERED BY patient   RELATIONSHIP SELF

## 2019-07-16 NOTE — Patient Instructions (Signed)
Medicare Wellness Visit, Female     The best way to live healthy is to have a lifestyle where you eat a well-balanced diet, exercise regularly, limit alcohol use, and quit all forms of tobacco/nicotine, if applicable.     Regular preventive services are another way to keep healthy. Preventive services (vaccines, screening tests, monitoring & exams) can help personalize your care plan, which helps you manage your own care. Screening tests can find health problems at the earliest stages, when they are easiest to treat.   [facility] follows the current, evidence-based guidelines published by the [place] [employer] (USPSTF) when recommending preventive services for our patients. Because we follow these guidelines, sometimes recommendations change over time as research supports it. (For example, mammograms used to be recommended annually. Even though Medicare will still pay for an annual mammogram, the newer guidelines recommend a mammogram every two years for women of average risk).  Of course, you and your doctor may decide to screen more often for some diseases, based on your risk and your co-morbidities (chronic disease you are already diagnosed with).     Preventive services for you include:  - Medicare offers their members a free annual wellness visit, which is time for you and your primary care provider to discuss and plan for your preventive service needs. Take advantage of this benefit every year!  -All adults over the age of 31 should receive the recommended pneumonia vaccines. Current USPSTF guidelines recommend a series of two vaccines for the best pneumonia protection.   -All adults should have a flu vaccine yearly and a tetanus vaccine every 10 years.   -All adults age 36 and older should receive the shingles vaccines (series of two vaccines).      -All adults age 10-70 who are overweight should have a diabetes screening test once every three years.    -All adults born between [birth year] and 1965 should be screened once for Hepatitis C.  -Other screening tests and preventive services for persons with diabetes include: an eye exam to screen for diabetic retinopathy, a kidney function test, a foot exam, and stricter control over your cholesterol.   -Cardiovascular screening for adults with routine risk involves an electrocardiogram (ECG) at intervals determined by your doctor.   -Colorectal cancer screenings should be done for adults age 40-75 with no increased risk factors for colorectal cancer.  There are a number of acceptable methods of screening for this type of cancer. Each test has its own benefits and drawbacks. Discuss with your doctor what is most appropriate for you during your annual wellness visit. The different tests include: colonoscopy (considered the best screening method), a fecal occult blood test, a fecal DNA test, and sigmoidoscopy.    -A bone mass density test is recommended when a woman turns 65 to screen for osteoporosis. This test is only recommended one time, as a screening. Some providers will use this same test as a disease monitoring tool if you already have osteoporosis.  -Breast cancer screenings are recommended every other year for women of normal risk, age 59-74.  -Cervical cancer screenings for women over age 72 are only recommended with certain risk factors.     Here is a list of your current Health Maintenance items (your personalized list of preventive services) with a due date:  Health Maintenance Due   Topic Date Due   ??? Eye Exam  09/09/1951   ??? Glaucoma Screening   03/04/2018   ???  Annual Well Visit  03/07/2019   ??? Albumin Urine Test  03/07/2019   ??? Yearly Flu Vaccine (1) 06/18/2019

## 2019-07-19 ENCOUNTER — Inpatient Hospital Stay (HOSPITAL_BASED_OUTPATIENT_CLINIC_OR_DEPARTMENT_OTHER): Payer: Medicare Other | Admitting: Hematology and Oncology

## 2019-07-19 ENCOUNTER — Inpatient Hospital Stay: Payer: Medicare Other | Attending: Gynecologic Oncology

## 2019-07-19 ENCOUNTER — Inpatient Hospital Stay: Payer: Medicare Other

## 2019-07-19 ENCOUNTER — Encounter: Payer: Self-pay | Admitting: Hematology and Oncology

## 2019-07-19 ENCOUNTER — Other Ambulatory Visit: Payer: Self-pay

## 2019-07-19 VITALS — BP 115/64 | HR 72 | Temp 98.2°F | Resp 18 | Ht 62.0 in | Wt 145.9 lb

## 2019-07-19 DIAGNOSIS — Z5112 Encounter for antineoplastic immunotherapy: Secondary | ICD-10-CM | POA: Diagnosis not present

## 2019-07-19 DIAGNOSIS — C5702 Malignant neoplasm of left fallopian tube: Secondary | ICD-10-CM

## 2019-07-19 DIAGNOSIS — R030 Elevated blood-pressure reading, without diagnosis of hypertension: Secondary | ICD-10-CM | POA: Diagnosis not present

## 2019-07-19 DIAGNOSIS — Z95828 Presence of other vascular implants and grafts: Secondary | ICD-10-CM

## 2019-07-19 DIAGNOSIS — C569 Malignant neoplasm of unspecified ovary: Secondary | ICD-10-CM | POA: Diagnosis not present

## 2019-07-19 DIAGNOSIS — Z7189 Other specified counseling: Secondary | ICD-10-CM

## 2019-07-19 DIAGNOSIS — C5700 Malignant neoplasm of unspecified fallopian tube: Secondary | ICD-10-CM

## 2019-07-19 DIAGNOSIS — Z5111 Encounter for antineoplastic chemotherapy: Secondary | ICD-10-CM | POA: Diagnosis not present

## 2019-07-19 DIAGNOSIS — D61818 Other pancytopenia: Secondary | ICD-10-CM | POA: Diagnosis not present

## 2019-07-19 LAB — CMP (CANCER CENTER ONLY)
ALT: 23 U/L (ref 0–44)
AST: 16 U/L (ref 15–41)
Albumin: 3.9 g/dL (ref 3.5–5.0)
Alkaline Phosphatase: 74 U/L (ref 38–126)
Anion gap: 9 (ref 5–15)
BUN: 12 mg/dL (ref 8–23)
CO2: 24 mmol/L (ref 22–32)
Calcium: 9.1 mg/dL (ref 8.9–10.3)
Chloride: 103 mmol/L (ref 98–111)
Creatinine: 0.71 mg/dL (ref 0.44–1.00)
GFR, Est AFR Am: >60 mL/min (ref >60)
GFR, Estimated: >60 mL/min (ref >60)
Glucose, Bld: 99 mg/dL (ref 70–99)
Potassium: 4.4 mmol/L (ref 3.5–5.1)
Sodium: 136 mmol/L (ref 135–145)
Total Bilirubin: 0.3 mg/dL (ref 0.3–1.2)
Total Protein: 6.7 g/dL (ref 6.5–8.1)

## 2019-07-19 LAB — CBC WITH DIFFERENTIAL (CANCER CENTER ONLY)
Abs Immature Granulocytes: 0.03 10*3/uL (ref 0.00–0.07)
Basophils Absolute: 0 10*3/uL (ref 0.0–0.1)
Basophils Relative: 1 %
Eosinophils Absolute: 0.1 10*3/uL (ref 0.0–0.5)
Eosinophils Relative: 1 %
HCT: 33.4 % — ABNORMAL LOW (ref 36.0–46.0)
Hemoglobin: 11.6 g/dL — ABNORMAL LOW (ref 12.0–15.0)
Immature Granulocytes: 1 %
Lymphocytes Relative: 26 %
Lymphs Abs: 1.1 10*3/uL (ref 0.7–4.0)
MCH: 31.6 pg (ref 26.0–34.0)
MCHC: 34.7 g/dL (ref 30.0–36.0)
MCV: 91 fL (ref 80.0–100.0)
Monocytes Absolute: 0.5 10*3/uL (ref 0.1–1.0)
Monocytes Relative: 11 %
Neutro Abs: 2.5 10*3/uL (ref 1.7–7.7)
Neutrophils Relative %: 60 %
Platelet Count: 233 10*3/uL (ref 150–400)
RBC: 3.67 MIL/uL — ABNORMAL LOW (ref 3.87–5.11)
RDW: 13.4 % (ref 11.5–15.5)
WBC Count: 4.2 10*3/uL (ref 4.0–10.5)
nRBC: 0 % (ref 0.0–0.2)

## 2019-07-19 LAB — TOTAL PROTEIN, URINE DIPSTICK: Protein, ur: NEGATIVE mg/dL

## 2019-07-19 MED ORDER — SODIUM CHLORIDE 0.9 % IV SOLN
Freq: Once | INTRAVENOUS | Status: AC
  Administered 2019-07-19: 11:00:00 via INTRAVENOUS
  Filled 2019-07-19: qty 250

## 2019-07-19 MED ORDER — SODIUM CHLORIDE 0.9 % IV SOLN
64.0000 mg/m2 | Freq: Once | INTRAVENOUS | Status: AC
  Administered 2019-07-19: 108 mg via INTRAVENOUS
  Filled 2019-07-19: qty 18

## 2019-07-19 MED ORDER — SODIUM CHLORIDE 0.9% FLUSH
10.0000 mL | Freq: Once | INTRAVENOUS | Status: AC
  Administered 2019-07-19: 11:00:00 10 mL
  Filled 2019-07-19: qty 10

## 2019-07-19 MED ORDER — FAMOTIDINE IN NACL 20-0.9 MG/50ML-% IV SOLN
20.0000 mg | Freq: Once | INTRAVENOUS | Status: AC
  Administered 2019-07-19: 20 mg via INTRAVENOUS

## 2019-07-19 MED ORDER — HEPARIN SOD (PORK) LOCK FLUSH 100 UNIT/ML IV SOLN
500.0000 [IU] | Freq: Once | INTRAVENOUS | Status: AC | PRN
  Administered 2019-07-19: 500 [IU]
  Filled 2019-07-19: qty 5

## 2019-07-19 MED ORDER — SODIUM CHLORIDE 0.9% FLUSH
10.0000 mL | INTRAVENOUS | Status: DC | PRN
  Administered 2019-07-19: 10 mL
  Filled 2019-07-19: qty 10

## 2019-07-19 MED ORDER — SODIUM CHLORIDE 0.9 % IV SOLN
20.0000 mg | Freq: Once | INTRAVENOUS | Status: AC
  Administered 2019-07-19: 20 mg via INTRAVENOUS
  Filled 2019-07-19: qty 2

## 2019-07-19 MED ORDER — DIPHENHYDRAMINE HCL 50 MG/ML IJ SOLN
50.0000 mg | Freq: Once | INTRAMUSCULAR | Status: AC
  Administered 2019-07-19: 50 mg via INTRAVENOUS

## 2019-07-19 MED ORDER — FAMOTIDINE IN NACL 20-0.9 MG/50ML-% IV SOLN
INTRAVENOUS | Status: AC
  Filled 2019-07-19: qty 50

## 2019-07-19 MED ORDER — DIPHENHYDRAMINE HCL 50 MG/ML IJ SOLN
INTRAMUSCULAR | Status: AC
  Filled 2019-07-19: qty 1

## 2019-07-19 NOTE — Assessment & Plan Note (Signed)
Her blood pressure is satisfactory We will observe closely

## 2019-07-19 NOTE — Patient Instructions (Signed)
[facility] Discharge Instructions for Patients Receiving Chemotherapy  Today you received the following chemotherapy agents: Taxol.  To help prevent nausea and vomiting after your treatment, we encourage you to take your nausea medications as directed.   If you develop nausea and vomiting that is not controlled by your nausea medication, call the clinic.   BELOW ARE SYMPTOMS THAT SHOULD BE REPORTED IMMEDIATELY:  *FEVER GREATER THAN 100.5 F  *CHILLS WITH OR WITHOUT FEVER  NAUSEA AND VOMITING THAT IS NOT CONTROLLED WITH YOUR NAUSEA MEDICATION  *UNUSUAL SHORTNESS OF BREATH  *UNUSUAL BRUISING OR BLEEDING  TENDERNESS IN MOUTH AND THROAT WITH OR WITHOUT PRESENCE OF ULCERS  *URINARY PROBLEMS  *BOWEL PROBLEMS  UNUSUAL RASH Items with * indicate a potential emergency and should be followed up as soon as possible.  Feel free to call the clinic should you have any questions or concerns. The clinic phone number is [phone].  Please show the [place] at check-in to the Emergency Department and triage nurse.

## 2019-07-19 NOTE — Assessment & Plan Note (Signed)
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

## 2019-07-19 NOTE — Assessment & Plan Note (Signed)
So far, she tolerated treatment well except for mild pancytopenia I recommend minor dose adjustment Her next CT imaging will be due in November Tumor markers show positive response to treatment

## 2019-07-19 NOTE — Progress Notes (Signed)
[place] OFFICE PROGRESS NOTE  Patient Care Team: [name], MD as PCP - General (Internal Medicine)  ASSESSMENT & PLAN:  Fallopian tube cancer, carcinoma ([place]) So far, she tolerated treatment well except for mild pancytopenia I recommend minor dose adjustment Her next CT imaging will be due in November Tumor markers show positive response to treatment  Pancytopenia, acquired [facility]) She has mild intermittent pancytopenia from treatment I recommend minor dose adjustment of Taxol We will proceed with treatment today  Elevated BP without diagnosis of hypertension Her blood pressure is satisfactory We will observe closely   Orders Placed This Encounter  Procedures  . CT ABDOMEN PELVIS W CONTRAST    Standing Status:   Future    Standing Expiration Date:   07/18/2020    Order Specific Question:   If indicated for the ordered procedure, I authorize the administration of contrast media per Radiology protocol    Answer:   Yes    Order Specific Question:   Preferred imaging location?    Answer:   [facility]    Order Specific Question:   Radiology Contrast Protocol - do NOT remove file path    Answer:   [email or web address]    INTERVAL HISTORY: Please see below for problem oriented charting. She returns for chemotherapy and follow-up She feels well No worsening neuropathy No recent infection, fever or chills Her energy level is fair Denies abdominal pain no recent changes in bowel habits  SUMMARY OF ONCOLOGIC HISTORY: Oncology History Overview Note  High grade serous, left fallopian Neg genetics from germline mutation or tumor ER 20% PR 0% MMR normal MSI stable     Fallopian tube cancer, carcinoma ([place])  08/06/2014 Tumor Marker   Patient's tumor was tested for the following markers: CA-125 Results of the tumor marker test revealed 49   01/27/2016 Imaging   1. Extensive omental nodularity highly worrisome for  peritoneal carcinomatosis. Late recurrence of melanoma can present as peritoneal carcinomatosis. Ovarian cancer more commonly presents in this manner. Patient does have a predominately low density right adnexal lesion measuring up to 3.5 cm, although this lesion is not typical for ovarian cancer. 2. No evidence of bowel or ureteral obstruction. 3. No other definite signs of metastatic disease. There are small indeterminate low-density hepatic lesions.   02/08/2016 Tumor Marker   Patient's tumor was tested for the following markers: CA-125 Results of the tumor marker test revealed 656.2   02/15/2016 Procedure   CT-guided core biopsy performed of omental mass just deep to the abdominal wall.   02/15/2016 Pathology Results   Omentum, biopsy, left - PAPILLARY SEROUS NEOPLASM, SEE COMMENT. Microscopic Comment There are papillary collections of largely low grade appearing cells with numerous psammoma bodies. Given the limited material it is difficult to distinguish between invasive implants of a serous borderline tumor and serous carcinoma, especially given the low grade appearance.   02/18/2016 Pathology Results   1. Omentum, resection for tumor INVASIVE IMPLANT OF HIGH GRADE SEROUS CARCINOMA 2. Uterus +/- tubes/ovaries, neoplastic HIGH GRADE SEROUS CARCINOMA INVOLVING LEFT TUBAL FIMBRIA SEROUS CARCINOMA WITH PREDOMINANT PSAMMOMA BODIES IMPLANT AT UTERUS SEROSA, BILATERAL FALLOPIAN TUBAL SEROSA, AND ANTERIOR PERITONEAL REFLECTION CERVIX: HISTOLOGICAL UNREMARKABLE ENDOMETRIUM: INACTIVE ENDOMETRIUM MYOMETRIUM: LEIOMYOMA LEFT OVARY: CYSTADENOFIBROMA RIGHT OVARY AND FALLOPIAN TUBE: HISTOLOGICAL UNREMARKABLE Microscopic Comment 2. ONCOLOGY TABLE - FALLOPIAN TUBE 1. Specimen, including laterality: Omentum, uterus, bilateral ovaries and fallopian tubes 2. Procedure: Hysterectomy, bilateral salpingo-oophorectomy and tumor debulking omentectomy 3. Lymph node sampling performed: No 4. Tumor site:  uterus  serosa, bilateral fallopian tubal serosa, peritoneal and omentum 5. Tumor location in fallopian tube: Left fallopian tubal fimbria 6. Specimen integrity (intact/ruptured/disrupted): Intact 7. Tumor size (cm): multi focal invasive omentum implant greater than 2 cm, left fallopian tube tumor 0.8 cm. 8. Histologic type: Serous carcinoma 9. Grade: 3 10. Microscopic tumor extension: uterus serosa, bilateral fallopian tubal serosa, peritoneal and omentum 11. Margins: NA 12. Lymph-Vascular invasion: identified 13. Lymph nodes: # examined: 0; # positive: NA 14. TNM: pT3c, pNx 15. FIGO Stage (based on pathologic findings, needs clinical correlation: IIIC 16. Comment: High grade serous carcinoma multifocally and extensively involves left fallopian tubal fimbria, omentum, uterus serosa, bilateral fallopian tubal serosa and peritoneal. At the left fallopian tube there are small foci of serous tubal intraepithelial carcinoma identified, so we conclude the carcinoma is fallopian tube origin   03/09/2016 Tumor Marker   Patient's tumor was tested for the following markers: CA-125 Results of the tumor marker test revealed 154.4   03/15/2016 Procedure   Technically successful right IJ power-injectable port catheter placement. Ready for routine use.   03/18/2016 - 07/13/2016 Chemotherapy   The patient had 6 cycles of carboplatin and taxol   04/14/2016 Tumor Marker   Patient's tumor was tested for the following markers: CA-125 Results of the tumor marker test revealed 36.7   04/25/2016 Genetic Testing   Patient has genetic testing done for germline mutation Results revealed patient has no mutation   04/28/2016 Tumor Marker   Patient's tumor was tested for the following markers: CA-125 Results of the tumor marker test revealed 24.6   06/21/2016 Tumor Marker   Patient's tumor was tested for the following markers: CA-125 Results of the tumor marker test revealed 16.6   08/01/2016 Tumor Marker   Patient's  tumor was tested for the following markers: CA-125 Results of the tumor marker test revealed 17.2   08/05/2016 Imaging   Interval TAH-BSO. No evidence of residual pelvic mass or metastatic disease within the abdomen or pelvis. No other acute findings.    11/04/2016 Tumor Marker   Patient's tumor was tested for the following markers: CA-125 Results of the tumor marker test revealed 13.6   01/20/2017 Tumor Marker   Patient's tumor was tested for the following markers: CA-125 Results of the tumor marker test revealed 13.8   04/14/2017 Tumor Marker   Patient's tumor was tested for the following markers: CA-125 Results of the tumor marker test revealed 16.1   06/13/2017 Imaging   No acute findings.  No mass or hernia identified.  Colonic diverticulosis, without radiographic evidence of diverticulitis.  Aortic atherosclerosis.   07/03/2017 Tumor Marker   Patient's tumor was tested for the following markers: CA-125 Results of the tumor marker test revealed 18.4   09/19/2017 Tumor Marker   Patient's tumor was tested for the following markers: CA-125 Results of the tumor marker test revealed 18.5   01/23/2018 Tumor Marker   Patient's tumor was tested for the following markers: CA-125 Results of the tumor marker test revealed 35.4   01/30/2018 Imaging   1. No evidence of local fallopian tube carcinoma recurrence within the pelvis. Post hysterectomy. 2. No evidence of metastatic peritoneal disease or omental disease. No solid organ metastasis.    03/20/2018 Tumor Marker   Patient's tumor was tested for the following markers: CA-125 Results of the tumor marker test revealed 73   05/02/2018 Tumor Marker   Patient's tumor was tested for the following markers: CA-125 Results of the tumor marker test revealed 127.1  05/08/2018 Imaging   CT abdomen and pelvis 1. 7 cm left subdiaphragmatic collection of loculated fluid versus low-density soft tissue. Given the patient's history of rising  CA 125, metastatic disease considered highly likely.  2. Areas of fluid or recurrent disease identified in the right lower quadrant adjacent to the cecum and along right lower quadrant small bowel loops.   05/14/2018 Tumor Marker   Patient's tumor was tested for the following markers: CA-125 Results of the tumor marker test revealed 166    Genetic Testing   Patient has genetic testing done for ER/PR and MMR. Results revealed patient has the following on 02/18/2016 surgical pathology: ER 20%, PR 0% MMR: normal    Genetic Testing   Patient has genetic testing done for MSI. Results revealed patient has the following: MSI stable   05/18/2018 - 12/31/2018 Chemotherapy   The patient had carboplatin and Doxil   07/16/2018 Tumor Marker   Patient's tumor was tested for the following markers: CA-125 Results of the tumor marker test revealed 28.7   08/10/2018 Imaging   CT imaging:  Decreased size of peritoneal soft tissue masses in left upper quadrant, consistent with decreased metastatic disease.  No new or progressive metastatic disease. No other acute findings.  Colonic diverticulosis, without radiographic evidence of diverticulitis.  Stable small paraumbilical ventral hernia containing transverse colon.   08/13/2018 Tumor Marker   Patient's tumor was tested for the following markers: CA-125 Results of the tumor marker test revealed 21.6   08/24/2018 Echocardiogram   LV EF: 60% -  65%   10/15/2018 Tumor Marker   Patient's tumor was tested for the following markers: CA-125 Results of the tumor marker test revealed 20.1   10/19/2018 Imaging   Ct scan of abdomen and pelvis Status post hysterectomy, bilateral salpingo-oophorectomy, and omentectomy.  Two peritoneal implants in the left upper abdomen are stable versus mildly decreased, as above. No abdominopelvic ascites.  No evidence of new/progressive metastatic disease.     11/19/2018 Tumor Marker   Patient's tumor was  tested for the following markers: CA-125 Results of the tumor marker test revealed 21.7   11/23/2018 Echocardiogram   1. The left ventricle has normal systolic function of 33-54%. The cavity size was normal. There is no increased left ventricular wall thickness. Echo evidence of impaired diastolic relaxation.  2. The right ventricle has normal systolic function. The cavity was normal. There is no increase in right ventricular wall thickness.  3. The mitral valve is normal in structure. There is mild mitral annular calcification present.  4. The tricuspid valve is normal in structure.  5. The aortic valve is tricuspid There is mild thickening of the aortic valve.  6. The pulmonic valve was normal in structure. Pulmonic valve regurgitation is mild by color flow Doppler.  7. Normal LV systolic function; mild diastolic dysfunction.   12/31/2018 Tumor Marker   Patient's tumor was tested for the following markers: CA-125 Results of the tumor marker test revealed 17.1   01/31/2019 Imaging   1. Left upper quadrant peritoneal implants described previously have decreased in the interval. No new or progressive findings in the abdomen/pelvis today. No free fluid. 2. Right paraumbilical ventral hernia contains a small knuckle of transverse colon without complicating features. 3.  Aortic Atherosclerois (ICD10-170.0)  Aortic Atherosclerosis (ICD10-I70.0).   01/31/2019 Tumor Marker   Patient's tumor was tested for the following markers: CA-125 Results of the tumor marker test revealed 18.9   02/11/2019 - 03/13/2019 Chemotherapy   The patient is  taking Niraparib. She self discontinued after 1 month   02/25/2019 Tumor Marker   Patient's tumor was tested for the following markers: CA-125 Results of the tumor marker test revealed 18.6   05/13/2019 Tumor Marker   Patient's tumor was tested for the following markers: CA-125 Results of the tumor marker test revealed 230   05/22/2019 Imaging   1. Increased  size of 4.6 cm soft tissue mass in the gastrosplenic ligament, consistent with metastatic disease. 2. No other sites of metastatic disease identified within the abdomen or pelvis. 3. Colonic diverticulosis. No radiographic evidence of diverticulitis. 4. Stable small right paraumbilical hernia containing transverse colon.   Aortic Atherosclerosis (ICD10-I70.0).   05/31/2019 -  Chemotherapy   The patient had bevacizumab and Taxol for chemotherapy treatment.     05/31/2019 Tumor Marker   Patient's tumor was tested for the following markers: CA-125 Results of the tumor marker test revealed 84   06/21/2019 Tumor Marker   Patient's tumor was tested for the following markers: CA-125 Results of the tumor marker test revealed 49.4.     REVIEW OF SYSTEMS:   Constitutional: Denies fevers, chills or abnormal weight loss Eyes: Denies blurriness of vision Ears, nose, mouth, throat, and face: Denies mucositis or sore throat Respiratory: Denies cough, dyspnea or wheezes Cardiovascular: Denies palpitation, chest discomfort or lower extremity swelling Gastrointestinal:  Denies nausea, heartburn or change in bowel habits Skin: Denies abnormal skin rashes Lymphatics: Denies new lymphadenopathy or easy bruising Neurological:Denies numbness, tingling or new weaknesses Behavioral/Psych: Mood is stable, no new changes  All other systems were reviewed with the patient and are negative.  I have reviewed the past medical history, past surgical history, social history and family history with the patient and they are unchanged from previous note.  ALLERGIES:  has No Known Allergies.  MEDICATIONS:  Current Outpatient Medications  Medication Sig Dispense Refill  . amLODipine (NORVASC) 5 MG tablet Take 1 tablet (5 mg total) by mouth daily. 30 tablet 1  . Cholecalciferol (VITAMIN D) 2000 units CAPS Take 1 capsule by mouth daily.    Marland Kitchen ezetimibe (ZETIA) 10 MG tablet Take 10 mg by mouth daily.    Marland Kitchen  lidocaine-prilocaine (EMLA) cream Apply to affected area once 30 g 3  . ondansetron (ZOFRAN) 8 MG tablet Take 1 tablet (8 mg total) by mouth 2 (two) times daily as needed (Nausea or vomiting). 30 tablet 1  . prochlorperazine (COMPAZINE) 10 MG tablet Take 1 tablet (10 mg total) by mouth every 6 (six) hours as needed for nausea or vomiting. (Patient not taking: Reported on 05/15/2019) 30 tablet 1  . prochlorperazine (COMPAZINE) 10 MG tablet Take 1 tablet (10 mg total) by mouth every 6 (six) hours as needed (Nausea or vomiting). 30 tablet 1   No current facility-administered medications for this visit.    Facility-Administered Medications Ordered in Other Visits  Medication Dose Route Frequency Provider Last Rate Last Dose  . heparin lock flush 100 unit/mL  500 Units Intracatheter Once PRN Alvy Bimler, Paiden Cavell, MD      . PACLitaxel (TAXOL) 108 mg in sodium chloride 0.9 % 250 mL chemo infusion (</= '80mg'$ /m2)  64 mg/m2 (Treatment Plan Recorded) Intravenous Once [name], MD 268 mL/hr at 07/19/19 1246 108 mg at 07/19/19 1246  . sodium chloride flush (NS) 0.9 % injection 10 mL  10 mL Intracatheter PRN [name], MD        PHYSICAL EXAMINATION: ECOG PERFORMANCE STATUS: 1 - Symptomatic but completely ambulatory  Vitals:  07/19/19 1100  BP: 115/64  Pulse: 72  Resp: 18  Temp: 98.2 F (36.8 C)  SpO2: 100%   Filed Weights   07/19/19 1100  Weight: 145 lb 14.4 oz (66.2 kg)    GENERAL:alert, no distress and comfortable SKIN: skin color, texture, turgor are normal, no rashes or significant lesions EYES: normal, Conjunctiva are pink and non-injected, sclera clear OROPHARYNX:no exudate, no erythema and lips, buccal mucosa, and tongue normal  NECK: supple, thyroid normal size, non-tender, without nodularity LYMPH:  no palpable lymphadenopathy in the cervical, axillary or inguinal LUNGS: clear to auscultation and percussion with normal breathing effort HEART: regular rate & rhythm and no murmurs and no  lower extremity edema ABDOMEN:abdomen soft, non-tender and normal bowel sounds Musculoskeletal:no cyanosis of digits and no clubbing  NEURO: alert & oriented x 3 with fluent speech, no focal motor/sensory deficits  LABORATORY DATA:  I have reviewed the data as listed    Component Value Date/Time   NA 136 07/19/2019 1039   NA 139 06/07/2017 1430   K 4.4 07/19/2019 1039   K 3.8 06/07/2017 1430   CL 103 07/19/2019 1039   CO2 24 07/19/2019 1039   CO2 27 06/07/2017 1430   GLUCOSE 99 07/19/2019 1039   GLUCOSE 124 06/07/2017 1430   BUN 12 07/19/2019 1039   BUN 14.0 06/07/2017 1430   CREATININE 0.71 07/19/2019 1039   CREATININE 0.8 06/07/2017 1430   CALCIUM 9.1 07/19/2019 1039   CALCIUM 9.3 06/07/2017 1430   PROT 6.7 07/19/2019 1039   PROT 6.6 09/30/2016 1027   ALBUMIN 3.9 07/19/2019 1039   ALBUMIN 3.5 09/30/2016 1027   AST 16 07/19/2019 1039   AST 18 09/30/2016 1027   ALT 23 07/19/2019 1039   ALT 21 09/30/2016 1027   ALKPHOS 74 07/19/2019 1039   ALKPHOS 77 09/30/2016 1027   BILITOT 0.3 07/19/2019 1039   BILITOT 0.37 09/30/2016 1027   GFRNONAA >60 07/19/2019 1039   GFRAA >60 07/19/2019 1039    No results found for: SPEP, UPEP  Lab Results  Component Value Date   WBC 4.2 07/19/2019   NEUTROABS 2.5 07/19/2019   HGB 11.6 (L) 07/19/2019   HCT 33.4 (L) 07/19/2019   MCV 91.0 07/19/2019   PLT 233 07/19/2019      Chemistry      Component Value Date/Time   NA 136 07/19/2019 1039   NA 139 06/07/2017 1430   K 4.4 07/19/2019 1039   K 3.8 06/07/2017 1430   CL 103 07/19/2019 1039   CO2 24 07/19/2019 1039   CO2 27 06/07/2017 1430   BUN 12 07/19/2019 1039   BUN 14.0 06/07/2017 1430   CREATININE 0.71 07/19/2019 1039   CREATININE 0.8 06/07/2017 1430      Component Value Date/Time   CALCIUM 9.1 07/19/2019 1039   CALCIUM 9.3 06/07/2017 1430   ALKPHOS 74 07/19/2019 1039   ALKPHOS 77 09/30/2016 1027   AST 16 07/19/2019 1039   AST 18 09/30/2016 1027   ALT 23 07/19/2019 1039    ALT 21 09/30/2016 1027   BILITOT 0.3 07/19/2019 1039   BILITOT 0.37 09/30/2016 1027       All questions were answered. The patient knows to call the clinic with any problems, questions or concerns. No barriers to learning was detected.  I spent 15 minutes counseling the patient face to face. The total time spent in the appointment was 20 minutes and more than 50% was on counseling and review of test results  Sherrel Ploch  Alvy Bimler, MD 07/19/2019 1:36 PM

## 2019-07-19 NOTE — Progress Notes (Signed)
The following biosimilar Zirabev (bevacizumab-bvzr) has been selected for use in this patient.  No PA req'd.  Kennith Center, Pharm.D., CPP 07/19/2019@1 :46 PM

## 2019-07-20 ENCOUNTER — Inpatient Hospital Stay
Admit: 2019-07-20 | Discharge: 2019-07-21 | Disposition: A | Discharge status: Home / Self Care | Payer: MEDICARE | Attending: Emergency Medicine

## 2019-07-20 ENCOUNTER — Emergency Department: Admit: 2019-07-20 | Payer: MEDICARE | Primary: Family Medicine

## 2019-07-20 DIAGNOSIS — R1084 Generalized abdominal pain: Secondary | ICD-10-CM

## 2019-07-20 LAB — CBC WITH AUTO DIFFERENTIAL
Basophils %: 1 % (ref 0–1)
Basophils Absolute: 0.1 10*3/uL (ref 0.0–0.1)
Eosinophils %: 2 % (ref 0–7)
Eosinophils Absolute: 0.2 10*3/uL (ref 0.0–0.4)
Granulocyte Absolute Count: 0.1 10*3/uL — ABNORMAL HIGH (ref 0.00–0.04)
Hematocrit: 41.8 % (ref 35.0–47.0)
Hemoglobin: 13.7 g/dL (ref 11.5–16.0)
Immature Granulocytes: 1 % — ABNORMAL HIGH (ref 0.0–0.5)
Lymphocytes %: 33 % (ref 12–49)
Lymphocytes Absolute: 3.3 10*3/uL (ref 0.8–3.5)
MCH: 29.4 PG (ref 26.0–34.0)
MCHC: 32.8 g/dL (ref 30.0–36.5)
MCV: 89.7 FL (ref 80.0–99.0)
MPV: 9.8 FL (ref 8.9–12.9)
Monocytes %: 12 % (ref 5–13)
Monocytes Absolute: 1.2 10*3/uL — ABNORMAL HIGH (ref 0.0–1.0)
NRBC Absolute: 0 10*3/uL (ref 0.00–0.01)
Neutrophils %: 51 % (ref 32–75)
Neutrophils Absolute: 5 10*3/uL (ref 1.8–8.0)
Nucleated RBCs: 0 PER 100 WBC
Platelets: 295 10*3/uL (ref 150–400)
RBC: 4.66 M/uL (ref 3.80–5.20)
RDW: 13.2 % (ref 11.5–14.5)
WBC: 9.7 10*3/uL (ref 3.6–11.0)

## 2019-07-20 LAB — URINALYSIS WITH MICROSCOPIC
BACTERIA, URINE: NEGATIVE /hpf
Bilirubin, Urine: NEGATIVE
Blood, Urine: NEGATIVE
Glucose, Ur: NEGATIVE mg/dL
Ketones, Urine: NEGATIVE mg/dL
Leukocyte Esterase, Urine: NEGATIVE
Nitrite, Urine: NEGATIVE
Protein, UA: NEGATIVE mg/dL
Specific Gravity, UA: 1.019 (ref 1.003–1.030)
Urobilinogen, UA, POCT: 0.2 EU/dL (ref 0.2–1.0)
pH, UA: 5 (ref 5.0–8.0)

## 2019-07-20 LAB — COMPREHENSIVE METABOLIC PANEL
ALT: 25 U/L (ref 12–78)
AST: 12 U/L — ABNORMAL LOW (ref 15–37)
Albumin/Globulin Ratio: 0.8 — ABNORMAL LOW (ref 1.1–2.2)
Albumin: 3.7 g/dL (ref 3.5–5.0)
Alkaline Phosphatase: 74 U/L (ref 45–117)
Anion Gap: 4 mmol/L — ABNORMAL LOW (ref 5–15)
BUN: 20 MG/DL (ref 6–20)
Bun/Cre Ratio: 29 — ABNORMAL HIGH (ref 12–20)
CO2: 33 mmol/L — ABNORMAL HIGH (ref 21–32)
Calcium: 9.3 MG/DL (ref 8.5–10.1)
Chloride: 102 mmol/L (ref 97–108)
Creatinine: 0.7 MG/DL (ref 0.55–1.02)
EGFR IF NonAfrican American: >60 mL/min/{1.73_m2} (ref >60)
GFR African American: >60 mL/min/{1.73_m2} (ref >60)
Globulin: 4.5 g/dL — ABNORMAL HIGH (ref 2.0–4.0)
Glucose: 100 mg/dL (ref 65–100)
Potassium: 3.4 mmol/L — ABNORMAL LOW (ref 3.5–5.1)
Sodium: 139 mmol/L (ref 136–145)
Total Bilirubin: 0.5 MG/DL (ref 0.2–1.0)
Total Protein: 8.2 g/dL (ref 6.4–8.2)

## 2019-07-20 LAB — LIPASE
Lipase: 88 U/L (ref 73–393)
Lipase: 88 U/L (ref 73–393)

## 2019-07-20 LAB — METABOLIC PANEL, COMPREHENSIVE
A-G Ratio: 0.8 — ABNORMAL LOW (ref 1.1–2.2)
ALT (SGPT): 25 U/L (ref 12–78)
AST (SGOT): 12 U/L — ABNORMAL LOW (ref 15–37)
Albumin: 3.7 g/dL (ref 3.5–5.0)
Alk. phosphatase: 74 U/L (ref 45–117)
Anion gap: 4 mmol/L — ABNORMAL LOW (ref 5–15)
BUN/Creatinine ratio: 29 — ABNORMAL HIGH (ref 12–20)
BUN: 20 MG/DL (ref 6–20)
Bilirubin, total: 0.5 MG/DL (ref 0.2–1.0)
CO2: 33 mmol/L — ABNORMAL HIGH (ref 21–32)
Calcium: 9.3 MG/DL (ref 8.5–10.1)
Chloride: 102 mmol/L (ref 97–108)
Creatinine: 0.7 MG/DL (ref 0.55–1.02)
GFR est AA: >60 mL/min/{1.73_m2} (ref >60)
GFR est non-AA: >60 mL/min/{1.73_m2} (ref >60)
Globulin: 4.5 g/dL — ABNORMAL HIGH (ref 2.0–4.0)
Glucose: 100 mg/dL (ref 65–100)
Potassium: 3.4 mmol/L — ABNORMAL LOW (ref 3.5–5.1)
Protein, total: 8.2 g/dL (ref 6.4–8.2)
Sodium: 139 mmol/L (ref 136–145)

## 2019-07-20 LAB — CBC WITH AUTOMATED DIFF
ABS. BASOPHILS: 0.1 10*3/uL (ref 0.0–0.1)
ABS. EOSINOPHILS: 0.2 10*3/uL (ref 0.0–0.4)
ABS. IMM. GRANS.: 0.1 10*3/uL — ABNORMAL HIGH (ref 0.00–0.04)
ABS. LYMPHOCYTES: 3.3 10*3/uL (ref 0.8–3.5)
ABS. MONOCYTES: 1.2 10*3/uL — ABNORMAL HIGH (ref 0.0–1.0)
ABS. NEUTROPHILS: 5 10*3/uL (ref 1.8–8.0)
ABSOLUTE NRBC: 0 10*3/uL (ref 0.00–0.01)
BASOPHILS: 1 % (ref 0–1)
EOSINOPHILS: 2 % (ref 0–7)
HCT: 41.8 % (ref 35.0–47.0)
HGB: 13.7 g/dL (ref 11.5–16.0)
IMMATURE GRANULOCYTES: 1 % — ABNORMAL HIGH (ref 0.0–0.5)
LYMPHOCYTES: 33 % (ref 12–49)
MCH: 29.4 PG (ref 26.0–34.0)
MCHC: 32.8 g/dL (ref 30.0–36.5)
MCV: 89.7 FL (ref 80.0–99.0)
MONOCYTES: 12 % (ref 5–13)
MPV: 9.8 FL (ref 8.9–12.9)
NEUTROPHILS: 51 % (ref 32–75)
NRBC: 0 PER 100 WBC
PLATELET: 295 10*3/uL (ref 150–400)
RBC: 4.66 M/uL (ref 3.80–5.20)
RDW: 13.2 % (ref 11.5–14.5)
WBC: 9.7 10*3/uL (ref 3.6–11.0)

## 2019-07-20 LAB — URINALYSIS W/MICROSCOPIC
Bacteria: NEGATIVE /hpf
Bilirubin: NEGATIVE
Blood: NEGATIVE
Glucose: NEGATIVE mg/dL
Ketone: NEGATIVE mg/dL
Leukocyte Esterase: NEGATIVE
Nitrites: NEGATIVE
Protein: NEGATIVE mg/dL
Specific gravity: 1.019 (ref 1.003–1.030)
Urobilinogen: 0.2 EU/dL (ref 0.2–1.0)
pH (UA): 5 (ref 5.0–8.0)

## 2019-07-20 LAB — SAMPLES BEING HELD

## 2019-07-20 LAB — URINE CULTURE HOLD SAMPLE

## 2019-07-20 LAB — CA 125: Cancer Antigen (CA) 125: 44.5 U/mL — ABNORMAL HIGH (ref 0.0–38.1)

## 2019-07-20 MED ORDER — IOPAMIDOL 76 % IV SOLN
76 % | Freq: Once | INTRAVENOUS | Status: AC
Start: 2019-07-20 — End: 2019-07-20
  Administered 2019-07-20: 23:00:00 via INTRAVENOUS

## 2019-07-20 MED FILL — ISOVUE-370  76 % INTRAVENOUS SOLUTION: 370 mg iodine /mL (76 %) | INTRAVENOUS | Qty: 100

## 2019-07-20 NOTE — ED Provider Notes (Signed)
Date of Service:  07/20/2019    Patient:  Dawn Green    Chief Complaint:  Abdominal Pain and Diarrhea       HPI:  Dawn Green is a 78 y.o.  female who presents for evaluation of abdominal pain.  Patient notes for the last month she is been having intermittent bilateral lower quadrant abdominal pain.  Started in the upper quadrants and now is in the lower quadrants.  He states that it comes and goes throughout the day and is associated with bowel movements.  He states that whenever she eats or drinks she has severe abdominal pain followed by bowel movement which tends to make the pain feel better.  Last night it was at its worst, 9 out of 10 left lower quadrant discomfort.  She had nausea without vomiting, chills without fevers.  Otherwise she denies any other acute complaints.  She did attempt a bland water diet which made her feel better but when she added actual food back into the mix, her pain returned           Past Medical History:   Diagnosis Date   ??? Breast CA ([facility]) 2014    Right - Lumpectomy    ??? Diabetes ([place])    ??? Glaucoma    ??? Hx of mammogram 02/05/2016    Negative per pt. Sees Dr. Laurena Bering    ??? Hyperlipemia    ??? Hypertension    ??? 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 Other ill-defined conditions(799.89) 2016    Mild URI x 4 months (allergies)    ??? Radiation therapy complication 4270   ??? Routine Papanicolaou smear 1996    Negative per pt.    ??? 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 BREAST SURGERY PROCEDURE UNLISTED Right 2014    Right breast lumpectomy   ??? HX BREAST BIOPSY Left     x 2 negative biopsies    ??? HX BREAST LUMPECTOMY Right 2014   ??? HX CATARACT REMOVAL Bilateral     w/ IOL implants   ??? HX TAH AND BSO  1997    for rapidly growing fibroid (JW) - benign.  Dr. Oran Rein.   ??? HX TUBAL LIGATION  1972         Family History:   Problem Relation Age of Onset   ??? Cancer Mother 32        Ovarian - Passed  away in 1966   ??? Ovarian Cancer Mother 56   ??? Other Son 20        Pancreatitis    ??? Cancer Paternal Aunt 26        Pancreatic       Social History     Socioeconomic History   ??? Marital status: MARRIED     Spouse name: Not on file   ??? Number of children: Not on file   ??? Years of education: Not on file   ??? Highest education level: Not on file   Occupational History   ??? Not on file   Social Needs   ??? Financial resource strain: Not on file   ??? Food insecurity     Worry: Not on file     Inability: Not on file   ??? Transportation needs     Medical: Not  on file     Non-medical: Not on file   Tobacco Use   ??? Smoking status: Former Smoker     Packs/day: 1.00     Years: 30.00     Pack years: 30.00     Last attempt to quit: 04/17/1996     Years since quitting: 23.2   ??? Smokeless tobacco: Never Used   ??? Tobacco comment: Never used vapor or e-cigs    Substance and Sexual Activity   ??? Alcohol use: Yes     Alcohol/week: 11.7 standard drinks     Types: 14 Standard drinks or equivalent per week     Comment: 1 gin and tonics per day   ??? Drug use: No   ??? Sexual activity: Yes     Partners: Male     Birth control/protection: Surgical   Lifestyle   ??? Physical activity     Days per week: Not on file     Minutes per session: Not on file   ??? Stress: Not on file   Relationships   ??? Social Product manager on phone: Not on file     Gets together: Not on file     Attends religious service: Not on file     Active member of club or organization: Not on file     Attends meetings of clubs or organizations: Not on file     Relationship status: Not on file   ??? Intimate partner violence     Fear of current or ex partner: Not on file     Emotionally abused: Not on file     Physically abused: Not on file     Forced sexual activity: Not on file   Other Topics Concern   ??? Not on file   Social History Narrative   ??? Not on file         ALLERGIES: Shampoo & body wash [skin cleanser,general]    Review of Systems   Constitutional: Positive for chills.  Negative for fever.   HENT: Negative for hearing loss.    Eyes: Negative for visual disturbance.   Respiratory: Negative for shortness of breath.    Cardiovascular: Negative for chest pain.   Gastrointestinal: Positive for abdominal pain, diarrhea and nausea. Negative for constipation and vomiting.   Genitourinary: Negative for dysuria and flank pain.   Musculoskeletal: Negative for back pain.   Skin: Negative for rash.   Neurological: Negative for dizziness and light-headedness.   Psychiatric/Behavioral: Negative for confusion.       Vitals:    07/20/19 1619   BP: (!) 143/77   Pulse: 75   Resp: 18   Temp: 98.6 ??F (37 ??C)   SpO2: 97%   Weight: 77.1 kg (170 lb)   Height: 5\' 3"  (1.6 m)            Physical Exam  Vitals signs and nursing note reviewed.   Constitutional:       General: She is not in acute distress.     Appearance: She is well-developed. She is not ill-appearing, toxic-appearing or diaphoretic.   HENT:      Head: Normocephalic and atraumatic.   Eyes:      General: No scleral icterus.     Conjunctiva/sclera: Conjunctivae normal.      Pupils: Pupils are equal, round, and reactive to light.   Neck:      Musculoskeletal: Neck supple.      Vascular: No JVD.  Trachea: No tracheal deviation.   Cardiovascular:      Rate and Rhythm: Normal rate and regular rhythm.      Heart sounds: No murmur.   Pulmonary:      Effort: Pulmonary effort is normal. No respiratory distress.      Breath sounds: Normal breath sounds.   Abdominal:      General: Bowel sounds are normal. There is no distension.      Palpations: Abdomen is soft.      Tenderness: There is no abdominal tenderness. There is no right CVA tenderness or left CVA tenderness. Negative signs include Murphy's sign and McBurney's sign.   Musculoskeletal: Normal range of motion.         General: No tenderness or deformity.   Skin:     General: Skin is warm and dry.      Capillary Refill: Capillary refill takes less than 2 seconds.      Findings: No rash.    Neurological:      Mental Status: She is alert and oriented to person, place, and time.   Psychiatric:         Behavior: Behavior normal.         Thought Content: Thought content normal.         Judgment: Judgment normal.          MDM  Number of Diagnoses or Management Options  Abdominal pain, generalized:        VITAL SIGNS:  Patient Vitals for the past 4 hrs:   BP SpO2   07/20/19 2000 120/63 96 %   07/20/19 1945 105/64 ???   07/20/19 1944 ??? 97 %         LABS:  Recent Results (from the past 6 hour(s))   URINALYSIS W/MICROSCOPIC    Collection Time: 07/20/19  5:47 PM   Result Value Ref Range    Color YELLOW/STRAW      Appearance CLEAR CLEAR      Specific gravity 1.019 1.003 - 1.030      pH (UA) 5.0 5.0 - 8.0      Protein Negative NEG mg/dL    Glucose Negative NEG mg/dL    Ketone Negative NEG mg/dL    Bilirubin Negative NEG      Blood Negative NEG      Urobilinogen 0.2 0.2 - 1.0 EU/dL    Nitrites Negative NEG      Leukocyte Esterase Negative NEG      WBC 0-4 0 - 4 /hpf    RBC 0-5 0 - 5 /hpf    Epithelial cells FEW FEW /lpf    Bacteria Negative NEG /hpf    Hyaline cast 2-5 0 - 5 /lpf   URINE CULTURE HOLD SAMPLE    Collection Time: 07/20/19  5:47 PM    Specimen: Serum; Urine   Result Value Ref Range    Urine culture hold        Urine on hold in Microbiology dept for 2 days.  If unpreserved urine is submitted, it cannot be used for addtional testing after 24 hours, recollection will be required.        IMAGING:  CT ABD PELV W CONT   Final Result   IMPRESSION: Extensive colonic diverticulosis without CT apparent diverticulitis.            Medications During Visit:  Medications   iopamidoL (ISOVUE-370) 76 % injection 100 mL (100 mL IntraVENous Given 07/20/19 1905)  DECISION MAKING:  Dawn Green is a 78 y.o. female who comes in as above.  Diagnostics as above.  Because of discomfort is unknown.  Other diagnostics failed to show any type of inner abdominal process.  I discussed these findings with the patient.  She is  understandable of it.  Follow-up as below.    IMPRESSION:  1. Abdominal pain, generalized        DISPOSITION:  Discharged      Discharge Medication List as of 07/20/2019  7:59 PM           Follow-up Information     Follow up With Specialties Details Why Contact Info    Lobb, Laray Anger, DO Family Medicine Schedule an appointment as soon as possible for a visit   [address]  [address]  [place] [address]  [phone]      Darlys Gales, MD Gastroenterology Call  As needed [place] [address]  [phone]              The patient is asked to follow-up with their primary care provider in the next several days.  They are to call tomorrow for an appointment.  The patient is asked to return promptly for any increased concerns or worsening of symptoms.  They can return to this emergency department or any other emergency department.    Procedures

## 2019-07-20 NOTE — ED Notes (Signed)
Patient discharged by provider.  Discharge paperwork reviewed and patient denies questions.  Leaves ambulatory and in no apparent distress

## 2019-07-20 NOTE — ED Notes (Signed)
Patient co painful diarrhea x 1 month, denies blood in stool. States it is always after eating, has tried a clear diet with some success but problem reoccurs after advancing diet.

## 2019-07-20 NOTE — ED Triage Notes (Signed)
Patient co painful diarrhea x 1 month, denies blood in stool. States it is always after eating, has tried a clear diet with some success but problem reoccurs after advancing diet.

## 2019-07-20 NOTE — ED Provider Notes (Signed)
Date of Service:  07/20/2019    Patient:  Dawn Green    Chief Complaint:  Abdominal Pain and Diarrhea       HPI:  Dawn Green is a 78 y.o.  female who presents for evaluation of abdominal pain.  Patient notes for the last month she is been having intermittent bilateral lower quadrant abdominal pain.  Started in the upper quadrants and now is in the lower quadrants.  He states that it comes and goes throughout the day and is associated with bowel movements.  He states that whenever she eats or drinks she has severe abdominal pain followed by bowel movement which tends to make the pain feel better.  Last night it was at its worst, 9 out of 10 left lower quadrant discomfort.  She had nausea without vomiting, chills without fevers.  Otherwise she denies any other acute complaints.  She did attempt a bland water diet which made her feel better but when she added actual food back into the mix, her pain returned           Past Medical History:   Diagnosis Date   ??? Breast CA ([facility]) 07-Jan-2013    Right - Lumpectomy    ??? Diabetes ([facility])    ??? Glaucoma    ??? Hx of mammogram 02/05/2016    Negative per pt. Sees Dr. Laurena Bering    ??? Hyperlipemia    ??? Hypertension    ??? 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 Other ill-defined conditions(799.89) January 08, 2015    Mild URI x 4 months (allergies)    ??? Radiation therapy complication 3762   ??? Routine Papanicolaou smear 1996    Negative per pt.    ??? 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 BREAST SURGERY PROCEDURE UNLISTED Right 01-07-2013    Right breast lumpectomy   ??? HX BREAST BIOPSY Left     x 2 negative biopsies    ??? HX BREAST LUMPECTOMY Right 01-07-13   ??? HX CATARACT REMOVAL Bilateral     w/ IOL implants   ??? HX TAH AND BSO  01/08/96    for rapidly growing fibroid (JW) - benign.  Dr. Oran Rein.   ??? HX TUBAL LIGATION  1972         Family History:   Problem Relation Age of Onset   ??? Cancer Mother 70         Ovarian - Passed away in 07-Jan-1965   ??? Ovarian Cancer Mother 5   ??? Other Son 30        Pancreatitis    ??? Cancer Paternal Aunt 54        Pancreatic       Social History     Socioeconomic History   ??? Marital status: MARRIED     Spouse name: Not on file   ??? Number of children: Not on file   ??? Years of education: Not on file   ??? Highest education level: Not on file   Occupational History   ??? Not on file   Social Needs   ??? Financial resource strain: Not on file   ??? Food insecurity     Worry: Not on file     Inability: Not on file   ??? Transportation needs     Medical: Not  on file     Non-medical: Not on file   Tobacco Use   ??? Smoking status: Former Smoker     Packs/day: 1.00     Years: 30.00     Pack years: 30.00     Last attempt to quit: 04/17/1996     Years since quitting: 23.2   ??? Smokeless tobacco: Never Used   ??? Tobacco comment: Never used vapor or e-cigs    Substance and Sexual Activity   ??? Alcohol use: Yes     Alcohol/week: 11.7 standard drinks     Types: 14 Standard drinks or equivalent per week     Comment: 1 gin and tonics per day   ??? Drug use: No   ??? Sexual activity: Yes     Partners: Male     Birth control/protection: Surgical   Lifestyle   ??? Physical activity     Days per week: Not on file     Minutes per session: Not on file   ??? Stress: Not on file   Relationships   ??? Social Product manager on phone: Not on file     Gets together: Not on file     Attends religious service: Not on file     Active member of club or organization: Not on file     Attends meetings of clubs or organizations: Not on file     Relationship status: Not on file   ??? Intimate partner violence     Fear of current or ex partner: Not on file     Emotionally abused: Not on file     Physically abused: Not on file     Forced sexual activity: Not on file   Other Topics Concern   ??? Not on file   Social History Narrative   ??? Not on file         ALLERGIES: Shampoo & body wash [skin cleanser,general]    Review of Systems    Constitutional: Positive for chills. Negative for fever.   HENT: Negative for hearing loss.    Eyes: Negative for visual disturbance.   Respiratory: Negative for shortness of breath.    Cardiovascular: Negative for chest pain.   Gastrointestinal: Positive for abdominal pain, diarrhea and nausea. Negative for constipation and vomiting.   Genitourinary: Negative for dysuria and flank pain.   Musculoskeletal: Negative for back pain.   Skin: Negative for rash.   Neurological: Negative for dizziness and light-headedness.   Psychiatric/Behavioral: Negative for confusion.       Vitals:    07/20/19 1619   BP: (!) 143/77   Pulse: 75   Resp: 18   Temp: 98.6 ??F (37 ??C)   SpO2: 97%   Weight: 77.1 kg (170 lb)   Height: 5\' 3"  (1.6 m)            Physical Exam  Vitals signs and nursing note reviewed.   Constitutional:       General: She is not in acute distress.     Appearance: She is well-developed. She is not ill-appearing, toxic-appearing or diaphoretic.   HENT:      Head: Normocephalic and atraumatic.   Eyes:      General: No scleral icterus.     Conjunctiva/sclera: Conjunctivae normal.      Pupils: Pupils are equal, round, and reactive to light.   Neck:      Musculoskeletal: Neck supple.      Vascular: No JVD.  Trachea: No tracheal deviation.   Cardiovascular:      Rate and Rhythm: Normal rate and regular rhythm.      Heart sounds: No murmur.   Pulmonary:      Effort: Pulmonary effort is normal. No respiratory distress.      Breath sounds: Normal breath sounds.   Abdominal:      General: Bowel sounds are normal. There is no distension.      Palpations: Abdomen is soft.      Tenderness: There is no abdominal tenderness. There is no right CVA tenderness or left CVA tenderness. Negative signs include Murphy's sign and McBurney's sign.   Musculoskeletal: Normal range of motion.         General: No tenderness or deformity.   Skin:     General: Skin is warm and dry.       Capillary Refill: Capillary refill takes less than 2 seconds.      Findings: No rash.   Neurological:      Mental Status: She is alert and oriented to person, place, and time.   Psychiatric:         Behavior: Behavior normal.         Thought Content: Thought content normal.         Judgment: Judgment normal.          MDM  Number of Diagnoses or Management Options  Abdominal pain, generalized:        VITAL SIGNS:  Patient Vitals for the past 4 hrs:   BP SpO2   07/20/19 2000 120/63 96 %   07/20/19 1945 105/64 ???   07/20/19 1944 ??? 97 %         LABS:  Recent Results (from the past 6 hour(s))   URINALYSIS W/MICROSCOPIC    Collection Time: 07/20/19  5:47 PM   Result Value Ref Range    Color YELLOW/STRAW      Appearance CLEAR CLEAR      Specific gravity 1.019 1.003 - 1.030      pH (UA) 5.0 5.0 - 8.0      Protein Negative NEG mg/dL    Glucose Negative NEG mg/dL    Ketone Negative NEG mg/dL    Bilirubin Negative NEG      Blood Negative NEG      Urobilinogen 0.2 0.2 - 1.0 EU/dL    Nitrites Negative NEG      Leukocyte Esterase Negative NEG      WBC 0-4 0 - 4 /hpf    RBC 0-5 0 - 5 /hpf    Epithelial cells FEW FEW /lpf    Bacteria Negative NEG /hpf    Hyaline cast 2-5 0 - 5 /lpf   URINE CULTURE HOLD SAMPLE    Collection Time: 07/20/19  5:47 PM    Specimen: Serum; Urine   Result Value Ref Range    Urine culture hold        Urine on hold in Microbiology dept for 2 days.  If unpreserved urine is submitted, it cannot be used for addtional testing after 24 hours, recollection will be required.        IMAGING:  CT ABD PELV W CONT   Final Result   IMPRESSION: Extensive colonic diverticulosis without CT apparent diverticulitis.            Medications During Visit:  Medications   iopamidoL (ISOVUE-370) 76 % injection 100 mL (100 mL IntraVENous Given 07/20/19 1905)  DECISION MAKING:  VANECIA LIMPERT is a 78 y.o. female who comes in as above.  Diagnostics as above.  Because of discomfort is unknown.  Other diagnostics failed to  show any type of inner abdominal process.  I discussed these findings with the patient.  She is understandable of it.  Follow-up as below.    IMPRESSION:  1. Abdominal pain, generalized        DISPOSITION:  Discharged      Discharge Medication List as of 07/20/2019  7:59 PM           Follow-up Information     Follow up With Specialties Details Why Contact Info    Lobb, Laray Anger, DO Family Medicine Schedule an appointment as soon as possible for a visit   [address]  [address]  [place] [address]  [phone]      Darlys Gales, MD Gastroenterology Call  As needed [place] [address]  [phone]              The patient is asked to follow-up with their primary care provider in the next several days.  They are to call tomorrow for an appointment.  The patient is asked to return promptly for any increased concerns or worsening of symptoms.  They can return to this emergency department or any other emergency department.    Procedures

## 2019-07-22 ENCOUNTER — Telehealth: Payer: Self-pay | Admitting: Hematology and Oncology

## 2019-07-22 ENCOUNTER — Telehealth: Payer: Self-pay

## 2019-07-22 NOTE — Telephone Encounter (Signed)
-----   Message from [name], MD sent at 07/22/2019  8:05 AM EDT ----- Regarding: pls let her know CA-125 is better

## 2019-07-22 NOTE — Telephone Encounter (Signed)
Called and given below message. She verbalized understanding. 

## 2019-07-22 NOTE — Telephone Encounter (Signed)
I talk with patient regarding schedule  

## 2019-07-31 ENCOUNTER — Other Ambulatory Visit: Payer: Self-pay | Admitting: Hematology and Oncology

## 2019-07-31 ENCOUNTER — Inpatient Hospital Stay: Admit: 2019-10-23 | Payer: MEDICARE | Primary: Family Medicine

## 2019-07-31 DIAGNOSIS — R5383 Other fatigue: Secondary | ICD-10-CM

## 2019-08-01 LAB — LIPID PANEL
Cholesterol, Total: 150 mg/dL (ref 100–199)
Cholesterol, total: 150 mg/dL (ref 100–199)
HDL Cholesterol: 35 mg/dL — ABNORMAL LOW (ref >39)
HDL: 35 mg/dL — ABNORMAL LOW (ref >39)
LDL Calculated: 62 mg/dL (ref 0–99)
LDL, calculated: 62 mg/dL (ref 0–99)
Triglyceride: 337 mg/dL — ABNORMAL HIGH (ref 0–149)
Triglycerides: 337 mg/dL — ABNORMAL HIGH (ref 0–149)
VLDL, calculated: 53 mg/dL — ABNORMAL HIGH (ref 5–40)
VLDL: 53 mg/dL — ABNORMAL HIGH (ref 5–40)

## 2019-08-01 LAB — COMPREHENSIVE METABOLIC PANEL
ALT: 20 IU/L (ref 0–32)
AST: 14 IU/L (ref 0–40)
Albumin/Globulin Ratio: 1.5 NA (ref 1.2–2.2)
Albumin: 4 g/dL (ref 3.7–4.7)
Alkaline Phosphatase: 67 IU/L (ref 39–117)
BUN: 23 mg/dL (ref 8–27)
Bun/Cre Ratio: 29 NA — ABNORMAL HIGH (ref 12–28)
CO2: 24 mmol/L (ref 20–29)
Calcium: 9 mg/dL (ref 8.7–10.3)
Chloride: 101 mmol/L (ref 96–106)
Creatinine: 0.78 mg/dL (ref 0.57–1.00)
EGFR IF NonAfrican American: 74 mL/min/{1.73_m2} (ref >59)
GFR African American: 85 mL/min/{1.73_m2} (ref >59)
Globulin, Total: 2.6 g/dL (ref 1.5–4.5)
Glucose: 135 mg/dL — ABNORMAL HIGH (ref 65–99)
Potassium: 3.9 mmol/L (ref 3.5–5.2)
Sodium: 141 mmol/L (ref 134–144)
Total Bilirubin: 0.4 mg/dL (ref 0.0–1.2)
Total Protein: 6.6 g/dL (ref 6.0–8.5)

## 2019-08-01 LAB — MICROALBUMIN / CREATININE URINE RATIO
Creatinine, Ur: 169.2 mg/dL
Microalb, Ur: 12.7 ug/mL
Microalbumin Creatinine Ratio: 8 mg/g creat (ref 0–29)

## 2019-08-01 LAB — CVD REPORT

## 2019-08-01 LAB — TSH 3RD GENERATION
TSH: 3.56 u[IU]/mL (ref 0.450–4.500)
TSH: 3.56 u[IU]/mL (ref 0.450–4.500)

## 2019-08-01 LAB — HEMOGLOBIN A1C W/EAG
Hemoglobin A1C: 6.5 % — ABNORMAL HIGH (ref 4.8–5.6)
eAG: 140 mg/dL

## 2019-08-01 LAB — HEMOGLOBIN A1C WITH EAG
Estimated average glucose: 140 mg/dL
Hemoglobin A1c: 6.5 % — ABNORMAL HIGH (ref 4.8–5.6)

## 2019-08-01 LAB — METABOLIC PANEL, COMPREHENSIVE
A-G Ratio: 1.5 (ref 1.2–2.2)
ALT (SGPT): 20 IU/L (ref 0–32)
AST (SGOT): 14 IU/L (ref 0–40)
Albumin: 4 g/dL (ref 3.7–4.7)
Alk. phosphatase: 67 IU/L (ref 39–117)
BUN/Creatinine ratio: 29 — ABNORMAL HIGH (ref 12–28)
BUN: 23 mg/dL (ref 8–27)
Bilirubin, total: 0.4 mg/dL (ref 0.0–1.2)
CO2: 24 mmol/L (ref 20–29)
Calcium: 9 mg/dL (ref 8.7–10.3)
Chloride: 101 mmol/L (ref 96–106)
Creatinine: 0.78 mg/dL (ref 0.57–1.00)
GFR est AA: 85 mL/min/{1.73_m2} (ref >59)
GFR est non-AA: 74 mL/min/{1.73_m2} (ref >59)
GLOBULIN, TOTAL: 2.6 g/dL (ref 1.5–4.5)
Glucose: 135 mg/dL — ABNORMAL HIGH (ref 65–99)
Potassium: 3.9 mmol/L (ref 3.5–5.2)
Protein, total: 6.6 g/dL (ref 6.0–8.5)
Sodium: 141 mmol/L (ref 134–144)

## 2019-08-01 LAB — MICROALBUMIN, UR, RAND W/ MICROALB/CREAT RATIO
Creatinine, urine random: 169.2 mg/dL
Microalb/Creat ratio (ug/mg creat.): 8 mg/g creat (ref 0–29)
Microalbumin, urine: 12.7 ug/mL

## 2019-08-01 LAB — DIABETES PATIENT EDUCATION

## 2019-08-01 LAB — SPECIMEN STATUS REPORT

## 2019-08-02 ENCOUNTER — Other Ambulatory Visit: Payer: Self-pay

## 2019-08-02 ENCOUNTER — Inpatient Hospital Stay: Payer: Medicare Other

## 2019-08-02 VITALS — BP 113/60 | HR 70 | Temp 98.3°F | Resp 18

## 2019-08-02 DIAGNOSIS — C5702 Malignant neoplasm of left fallopian tube: Secondary | ICD-10-CM

## 2019-08-02 DIAGNOSIS — Z95828 Presence of other vascular implants and grafts: Secondary | ICD-10-CM

## 2019-08-02 DIAGNOSIS — Z5111 Encounter for antineoplastic chemotherapy: Secondary | ICD-10-CM | POA: Diagnosis not present

## 2019-08-02 DIAGNOSIS — Z7189 Other specified counseling: Secondary | ICD-10-CM

## 2019-08-02 DIAGNOSIS — Z5112 Encounter for antineoplastic immunotherapy: Secondary | ICD-10-CM | POA: Diagnosis not present

## 2019-08-02 LAB — CBC WITH DIFFERENTIAL (CANCER CENTER ONLY)
Abs Immature Granulocytes: 0.01 10*3/uL (ref 0.00–0.07)
Basophils Absolute: 0 10*3/uL (ref 0.0–0.1)
Basophils Relative: 0 %
Eosinophils Absolute: 0 10*3/uL (ref 0.0–0.5)
Eosinophils Relative: 1 %
HCT: 32.9 % — ABNORMAL LOW (ref 36.0–46.0)
Hemoglobin: 10.9 g/dL — ABNORMAL LOW (ref 12.0–15.0)
Immature Granulocytes: 0 %
Lymphocytes Relative: 26 %
Lymphs Abs: 0.8 10*3/uL (ref 0.7–4.0)
MCH: 30.7 pg (ref 26.0–34.0)
MCHC: 33.1 g/dL (ref 30.0–36.0)
MCV: 92.7 fL (ref 80.0–100.0)
Monocytes Absolute: 0.4 10*3/uL (ref 0.1–1.0)
Monocytes Relative: 13 %
Neutro Abs: 1.8 10*3/uL (ref 1.7–7.7)
Neutrophils Relative %: 60 %
Platelet Count: 250 10*3/uL (ref 150–400)
RBC: 3.55 MIL/uL — ABNORMAL LOW (ref 3.87–5.11)
RDW: 14.3 % (ref 11.5–15.5)
WBC Count: 3.1 10*3/uL — ABNORMAL LOW (ref 4.0–10.5)
nRBC: 0 % (ref 0.0–0.2)

## 2019-08-02 LAB — CMP (CANCER CENTER ONLY)
ALT: 20 U/L (ref 0–44)
AST: 16 U/L (ref 15–41)
Albumin: 3.5 g/dL (ref 3.5–5.0)
Alkaline Phosphatase: 68 U/L (ref 38–126)
Anion gap: 8 (ref 5–15)
BUN: 12 mg/dL (ref 8–23)
CO2: 25 mmol/L (ref 22–32)
Calcium: 9 mg/dL (ref 8.9–10.3)
Chloride: 106 mmol/L (ref 98–111)
Creatinine: 0.74 mg/dL (ref 0.44–1.00)
GFR, Est AFR Am: >60 mL/min (ref >60)
GFR, Estimated: >60 mL/min (ref >60)
Glucose, Bld: 96 mg/dL (ref 70–99)
Potassium: 4.5 mmol/L (ref 3.5–5.1)
Sodium: 139 mmol/L (ref 135–145)
Total Bilirubin: 0.3 mg/dL (ref 0.3–1.2)
Total Protein: 6.3 g/dL — ABNORMAL LOW (ref 6.5–8.1)

## 2019-08-02 MED ORDER — SODIUM CHLORIDE 0.9 % IV SOLN
64.0000 mg/m2 | Freq: Once | INTRAVENOUS | Status: AC
  Administered 2019-08-02: 108 mg via INTRAVENOUS
  Filled 2019-08-02: qty 18

## 2019-08-02 MED ORDER — SODIUM CHLORIDE 0.9 % IV SOLN
Freq: Once | INTRAVENOUS | Status: AC
  Administered 2019-08-02: 10:00:00 via INTRAVENOUS
  Filled 2019-08-02: qty 250

## 2019-08-02 MED ORDER — HEPARIN SOD (PORK) LOCK FLUSH 100 UNIT/ML IV SOLN
500.0000 [IU] | Freq: Once | INTRAVENOUS | Status: AC | PRN
  Administered 2019-08-02: 500 [IU]
  Filled 2019-08-02: qty 5

## 2019-08-02 MED ORDER — SODIUM CHLORIDE 0.9% FLUSH
10.0000 mL | INTRAVENOUS | Status: DC | PRN
  Administered 2019-08-02: 10 mL
  Filled 2019-08-02: qty 10

## 2019-08-02 MED ORDER — FAMOTIDINE IN NACL 20-0.9 MG/50ML-% IV SOLN
INTRAVENOUS | Status: AC
  Filled 2019-08-02: qty 50

## 2019-08-02 MED ORDER — SODIUM CHLORIDE 0.9 % IV SOLN
20.0000 mg | Freq: Once | INTRAVENOUS | Status: AC
  Administered 2019-08-02: 20 mg via INTRAVENOUS
  Filled 2019-08-02: qty 2

## 2019-08-02 MED ORDER — DIPHENHYDRAMINE HCL 50 MG/ML IJ SOLN
INTRAMUSCULAR | Status: AC
  Filled 2019-08-02: qty 1

## 2019-08-02 MED ORDER — FAMOTIDINE IN NACL 20-0.9 MG/50ML-% IV SOLN
20.0000 mg | Freq: Once | INTRAVENOUS | Status: AC
  Administered 2019-08-02: 20 mg via INTRAVENOUS

## 2019-08-02 MED ORDER — SODIUM CHLORIDE 0.9% FLUSH
10.0000 mL | Freq: Once | INTRAVENOUS | Status: AC
  Administered 2019-08-02: 10 mL
  Filled 2019-08-02: qty 10

## 2019-08-02 MED ORDER — DIPHENHYDRAMINE HCL 50 MG/ML IJ SOLN
50.0000 mg | Freq: Once | INTRAMUSCULAR | Status: AC
  Administered 2019-08-02: 50 mg via INTRAVENOUS

## 2019-08-02 MED ORDER — SODIUM CHLORIDE 0.9 % IV SOLN
10.0000 mg/kg | Freq: Once | INTRAVENOUS | Status: AC
  Administered 2019-08-02: 700 mg via INTRAVENOUS
  Filled 2019-08-02: qty 16

## 2019-08-02 NOTE — Patient Instructions (Signed)
[facility] Discharge Instructions for Patients Receiving Chemotherapy  Today you received the following chemotherapy agents Bevacizumab and Taxol  To help prevent nausea and vomiting after your treatment, we encourage you to take your nausea medication as directed If you develop nausea and vomiting that is not controlled by your nausea medication, call the clinic.   BELOW ARE SYMPTOMS THAT SHOULD BE REPORTED IMMEDIATELY:  *FEVER GREATER THAN 100.5 F  *CHILLS WITH OR WITHOUT FEVER  NAUSEA AND VOMITING THAT IS NOT CONTROLLED WITH YOUR NAUSEA MEDICATION  *UNUSUAL SHORTNESS OF BREATH  *UNUSUAL BRUISING OR BLEEDING  TENDERNESS IN MOUTH AND THROAT WITH OR WITHOUT PRESENCE OF ULCERS  *URINARY PROBLEMS  *BOWEL PROBLEMS  UNUSUAL RASH Items with * indicate a potential emergency and should be followed up as soon as possible.  Feel free to call the clinic should you have any questions or concerns. The clinic phone number is [phone].  Please show the [place] at check-in to the Emergency Department and triage nurse.

## 2019-08-02 NOTE — Patient Instructions (Signed)

## 2019-08-08 NOTE — Telephone Encounter (Signed)
08/08/2019 1:31 PM: Called patient and advised that per Dr. Laurena Bering, she no longer needs to see him for appointments. Patient agreed with this and stated she will ask her PCP to take over her Effexor prescription. Patient wanted to know if she should continue having bone density studies performed; advised patient that per Dr. Laurena Bering, she should have them with her PCP as part of her routine care every 2-3 years. Patient verbalized understanding and denied further questions or concerns.

## 2019-08-09 ENCOUNTER — Inpatient Hospital Stay: Payer: Medicare Other

## 2019-08-09 ENCOUNTER — Other Ambulatory Visit: Payer: Self-pay

## 2019-08-09 VITALS — BP 128/59 | HR 70 | Temp 98.2°F | Resp 20

## 2019-08-09 DIAGNOSIS — Z5111 Encounter for antineoplastic chemotherapy: Secondary | ICD-10-CM | POA: Diagnosis not present

## 2019-08-09 DIAGNOSIS — Z5112 Encounter for antineoplastic immunotherapy: Secondary | ICD-10-CM | POA: Diagnosis not present

## 2019-08-09 DIAGNOSIS — C5702 Malignant neoplasm of left fallopian tube: Secondary | ICD-10-CM

## 2019-08-09 DIAGNOSIS — Z7189 Other specified counseling: Secondary | ICD-10-CM

## 2019-08-09 DIAGNOSIS — Z95828 Presence of other vascular implants and grafts: Secondary | ICD-10-CM

## 2019-08-09 LAB — CBC WITH DIFFERENTIAL (CANCER CENTER ONLY)
Abs Immature Granulocytes: 0.05 10*3/uL (ref 0.00–0.07)
Basophils Absolute: 0 10*3/uL (ref 0.0–0.1)
Basophils Relative: 1 %
Eosinophils Absolute: 0.1 10*3/uL (ref 0.0–0.5)
Eosinophils Relative: 2 %
HCT: 32.5 % — ABNORMAL LOW (ref 36.0–46.0)
Hemoglobin: 10.7 g/dL — ABNORMAL LOW (ref 12.0–15.0)
Immature Granulocytes: 1 %
Lymphocytes Relative: 30 %
Lymphs Abs: 1.2 10*3/uL (ref 0.7–4.0)
MCH: 30.2 pg (ref 26.0–34.0)
MCHC: 32.9 g/dL (ref 30.0–36.0)
MCV: 91.8 fL (ref 80.0–100.0)
Monocytes Absolute: 0.4 10*3/uL (ref 0.1–1.0)
Monocytes Relative: 10 %
Neutro Abs: 2.4 10*3/uL (ref 1.7–7.7)
Neutrophils Relative %: 56 %
Platelet Count: 245 10*3/uL (ref 150–400)
RBC: 3.54 MIL/uL — ABNORMAL LOW (ref 3.87–5.11)
RDW: 14.1 % (ref 11.5–15.5)
WBC Count: 4.2 10*3/uL (ref 4.0–10.5)
nRBC: 0 % (ref 0.0–0.2)

## 2019-08-09 LAB — CMP (CANCER CENTER ONLY)
ALT: 21 U/L (ref 0–44)
AST: 16 U/L (ref 15–41)
Albumin: 3.4 g/dL — ABNORMAL LOW (ref 3.5–5.0)
Alkaline Phosphatase: 67 U/L (ref 38–126)
Anion gap: 9 (ref 5–15)
BUN: 11 mg/dL (ref 8–23)
CO2: 23 mmol/L (ref 22–32)
Calcium: 8.7 mg/dL — ABNORMAL LOW (ref 8.9–10.3)
Chloride: 107 mmol/L (ref 98–111)
Creatinine: 0.74 mg/dL (ref 0.44–1.00)
GFR, Est AFR Am: >60 mL/min (ref >60)
GFR, Estimated: >60 mL/min (ref >60)
Glucose, Bld: 101 mg/dL — ABNORMAL HIGH (ref 70–99)
Potassium: 4.4 mmol/L (ref 3.5–5.1)
Sodium: 139 mmol/L (ref 135–145)
Total Bilirubin: 0.4 mg/dL (ref 0.3–1.2)
Total Protein: 6.3 g/dL — ABNORMAL LOW (ref 6.5–8.1)

## 2019-08-09 MED ORDER — DIPHENHYDRAMINE HCL 50 MG/ML IJ SOLN
50.0000 mg | Freq: Once | INTRAMUSCULAR | Status: AC
  Administered 2019-08-09: 10:00:00 50 mg via INTRAVENOUS

## 2019-08-09 MED ORDER — SODIUM CHLORIDE 0.9% FLUSH
10.0000 mL | Freq: Once | INTRAVENOUS | Status: AC
  Administered 2019-08-09: 10 mL
  Filled 2019-08-09: qty 10

## 2019-08-09 MED ORDER — DIPHENHYDRAMINE HCL 50 MG/ML IJ SOLN
INTRAMUSCULAR | Status: AC
  Filled 2019-08-09: qty 1

## 2019-08-09 MED ORDER — SODIUM CHLORIDE 0.9 % IV SOLN
Freq: Once | INTRAVENOUS | Status: AC
  Administered 2019-08-09: 10:00:00 via INTRAVENOUS
  Filled 2019-08-09: qty 250

## 2019-08-09 MED ORDER — FAMOTIDINE IN NACL 20-0.9 MG/50ML-% IV SOLN
INTRAVENOUS | Status: AC
  Filled 2019-08-09: qty 50

## 2019-08-09 MED ORDER — SODIUM CHLORIDE 0.9 % IV SOLN
20.0000 mg | Freq: Once | INTRAVENOUS | Status: AC
  Administered 2019-08-09: 11:00:00 20 mg via INTRAVENOUS
  Filled 2019-08-09: qty 20

## 2019-08-09 MED ORDER — SODIUM CHLORIDE 0.9 % IV SOLN
64.0000 mg/m2 | Freq: Once | INTRAVENOUS | Status: AC
  Administered 2019-08-09: 11:00:00 108 mg via INTRAVENOUS
  Filled 2019-08-09: qty 18

## 2019-08-09 MED ORDER — HEPARIN SOD (PORK) LOCK FLUSH 100 UNIT/ML IV SOLN
500.0000 [IU] | Freq: Once | INTRAVENOUS | Status: AC | PRN
  Administered 2019-08-09: 13:00:00 500 [IU]
  Filled 2019-08-09: qty 5

## 2019-08-09 MED ORDER — SODIUM CHLORIDE 0.9% FLUSH
10.0000 mL | INTRAVENOUS | Status: DC | PRN
  Administered 2019-08-09: 13:00:00 10 mL
  Filled 2019-08-09: qty 10

## 2019-08-09 MED ORDER — FAMOTIDINE IN NACL 20-0.9 MG/50ML-% IV SOLN
20.0000 mg | Freq: Once | INTRAVENOUS | Status: AC
  Administered 2019-08-09: 10:00:00 20 mg via INTRAVENOUS

## 2019-08-09 NOTE — Patient Instructions (Signed)
[facility] Discharge Instructions for Patients Receiving Chemotherapy  Today you received the following chemotherapy agents:  Taxol.  To help prevent nausea and vomiting after your treatment, we encourage you to take your nausea medication as directed.   If you develop nausea and vomiting that is not controlled by your nausea medication, call the clinic.   BELOW ARE SYMPTOMS THAT SHOULD BE REPORTED IMMEDIATELY:  *FEVER GREATER THAN 100.5 F  *CHILLS WITH OR WITHOUT FEVER  NAUSEA AND VOMITING THAT IS NOT CONTROLLED WITH YOUR NAUSEA MEDICATION  *UNUSUAL SHORTNESS OF BREATH  *UNUSUAL BRUISING OR BLEEDING  TENDERNESS IN MOUTH AND THROAT WITH OR WITHOUT PRESENCE OF ULCERS  *URINARY PROBLEMS  *BOWEL PROBLEMS  UNUSUAL RASH Items with * indicate a potential emergency and should be followed up as soon as possible.  Feel free to call the clinic should you have any questions or concerns. The clinic phone number is [phone].  Please show the CHEMO ALERT CARD at check-in to the Emergency Department and triage nurse.   

## 2019-08-09 NOTE — Patient Instructions (Signed)

## 2019-08-14 ENCOUNTER — Encounter: Attending: Nurse Practitioner | Primary: Family Medicine

## 2019-08-14 ENCOUNTER — Encounter: Attending: Specialist | Primary: Family Medicine

## 2019-08-15 ENCOUNTER — Telehealth: Payer: Self-pay | Admitting: Oncology

## 2019-08-15 NOTE — Telephone Encounter (Signed)
Jessica Johnston called and requested not to be in the beds for her next infusion because she is claustrophobic.  She also said she is going to get her flu shot tomorrow or Monday with her PCP.

## 2019-08-16 ENCOUNTER — Other Ambulatory Visit: Payer: Medicare Other

## 2019-08-16 ENCOUNTER — Ambulatory Visit: Payer: Medicare Other

## 2019-08-16 DIAGNOSIS — Z23 Encounter for immunization: Secondary | ICD-10-CM | POA: Diagnosis not present

## 2019-08-21 ENCOUNTER — Encounter (HOSPITAL_COMMUNITY): Payer: Self-pay | Admitting: *Deleted

## 2019-08-21 ENCOUNTER — Other Ambulatory Visit: Payer: Self-pay

## 2019-08-21 ENCOUNTER — Emergency Department (HOSPITAL_COMMUNITY)
Admission: EM | Admit: 2019-08-21 | Discharge: 2019-08-21 | Disposition: A | Discharge status: Home / Self Care | Payer: Medicare Other | Attending: Emergency Medicine | Admitting: Emergency Medicine

## 2019-08-21 DIAGNOSIS — C786 Secondary malignant neoplasm of retroperitoneum and peritoneum: Secondary | ICD-10-CM | POA: Insufficient documentation

## 2019-08-21 DIAGNOSIS — Z85820 Personal history of malignant melanoma of skin: Secondary | ICD-10-CM | POA: Insufficient documentation

## 2019-08-21 DIAGNOSIS — Z79899 Other long term (current) drug therapy: Secondary | ICD-10-CM | POA: Diagnosis not present

## 2019-08-21 DIAGNOSIS — Z8543 Personal history of malignant neoplasm of ovary: Secondary | ICD-10-CM | POA: Diagnosis not present

## 2019-08-21 DIAGNOSIS — R1012 Left upper quadrant pain: Secondary | ICD-10-CM | POA: Insufficient documentation

## 2019-08-21 DIAGNOSIS — Z85828 Personal history of other malignant neoplasm of skin: Secondary | ICD-10-CM | POA: Diagnosis not present

## 2019-08-21 DIAGNOSIS — R1902 Left upper quadrant abdominal swelling, mass and lump: Secondary | ICD-10-CM | POA: Insufficient documentation

## 2019-08-21 MED ORDER — OXYCODONE-ACETAMINOPHEN 5-325 MG PO TABS
1.0000 | ORAL_TABLET | Freq: Once | ORAL | Status: AC
  Administered 2019-08-21: 1 via ORAL
  Filled 2019-08-21: qty 1

## 2019-08-21 NOTE — ED Triage Notes (Signed)
Pt says about 4pm today she had increased pain in the left abdomen (reports she usually has pain in the abdomen, but not like this). Also having pain in the upper left chest area, radiates to the left arm, feels "crampy". No contributing symptoms. Pt took tylenol for pain without relief. Denies fevers. Pt is chemo patient.

## 2019-08-21 NOTE — ED Provider Notes (Signed)
[facility] DEPT Provider Note   CSN: [id number] Arrival date & time: 08/21/19  1934     History   Chief Complaint Chief Complaint  Patient presents with  . Chest Pain    HPI Jessica Johnston is a 78 y.o. female.     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  Patient states she was having some discomfort in her abdomen with into her left shoulder today.  She is supposed to get a CT scan tomorrow morning.  Patient having minimal discomfort now  The history is provided by the patient. No language interpreter was used.  Abdominal Pain Pain location:  LUQ Pain quality: aching   Pain radiates to:  L shoulder Pain severity:  Moderate Onset quality:  Sudden Timing:  Intermittent Progression:  Resolved Chronicity:  New Context: not alcohol use   Associated symptoms: no chest pain, no cough, no diarrhea, no fatigue and no hematuria     Past Medical History:  Diagnosis Date  . Arthritis    arthritis -hands-knees.  . Cancer ([place])    MELANOMA. multiple x5 different sites. last 10 yrs ago.  Marland Kitchen Headache    past hx.-no in awhile.  . High cholesterol   . Melanoma ([place])   . Osteopenia   . Skin cancer    melanoma    Patient Active Problem List   Diagnosis Date Noted  . Ovarian cancer ([place]) 07/19/2019  . Elevated BP without diagnosis of hypertension 02/18/2019  . Pancytopenia, acquired ([place]) 10/15/2018  . Anemia due to antineoplastic chemotherapy 08/13/2018  . Skin rash 08/13/2018  . Hypotension 06/16/2018  . Goals of care, counseling/discussion 05/15/2018  . Encounter for antineoplastic chemotherapy 05/14/2018  . Vasovagal syncope 07/05/2016  . 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 Syncope and collapse 06/28/2016  . Genetic testing 05/13/2016  . Melanoma ([place])   . Elevated LFTs 04/16/2016  . Portacath in place 04/01/2016  . Chemotherapy induced neutropenia ([place])  04/01/2016  . Basal cell carcinoma of skin 04/01/2016  . History of melanoma 03/26/2016  . 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 Port catheter in place 03/18/2016  . Poor venous access 03/11/2016  . Melanoma of skin ([place]) 03/11/2016  . History of migraine with aura 03/11/2016  . Diverticulosis of large intestine without hemorrhage 03/11/2016  . Elevated cholesterol 03/11/2016  . Fallopian tube cancer, carcinoma ([place]) 03/07/2016  . 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 Pelvic mass in female 02/18/2016  . Omental mass 02/08/2016  . Abdominal mass of other site 02/08/2016  . History of ovarian cyst 07/08/2014  . H/O vitamin D deficiency 07/08/2014  . Osteopenia 07/08/2014  . Vaginal atrophy 07/08/2014    Past Surgical History:  Procedure Laterality Date  . ABDOMINAL HYSTERECTOMY N/A 02/18/2016   Procedure: HYSTERECTOMY ABDOMINAL;  Surgeon: Everitt Amber, MD;  Location: [facility];  Service: Gynecology;  Laterality: N/A;  . DEBULKING N/A 02/18/2016   Procedure: RADICAL TUMOR DEBULKING;  Surgeon: Everitt Amber, MD;  Location: [facility];  Service: Gynecology;  Laterality: N/A;  . HYSTEROSCOPY    . LAPAROTOMY N/A 02/18/2016   Procedure: EXPLORATORY LAPAROTOMY;  Surgeon: Everitt Amber, MD;  Location: [facility];  Service: Gynecology;  Laterality: N/A;  . omental mass     Biopsy, CT guided  . OMENTECTOMY N/A 02/18/2016   Procedure: OMENTECTOMY;  Surgeon: Everitt Amber, MD;  Location: [facility];  Service: Gynecology;  Laterality: N/A;  .  SALPINGOOPHORECTOMY Bilateral 02/18/2016   Procedure: BILATERAL SALPINGO OOPHORECTOMY;  Surgeon: Everitt Amber, MD;  Location: [facility];  Service: Gynecology;  Laterality: Bilateral;  . TONSILLECTOMY AND ADENOIDECTOMY  1948     OB History    Gravida  3   Para  2   Term      Preterm      AB  1   Living  2     SAB  1   TAB      Ectopic      Multiple      Live Births               Home Medications    Prior to Admission medications    Medication Sig Start Date End Date Taking? Authorizing Provider  amLODipine (NORVASC) 5 MG tablet TAKE ONE TABLET EACH DAY 07/31/19   [name], MD  Cholecalciferol (VITAMIN D) 2000 units CAPS Take 1 capsule by mouth daily.    [provider]  ezetimibe (ZETIA) 10 MG tablet Take 10 mg by mouth daily.    [provider]  lidocaine-prilocaine (EMLA) cream Apply to affected area once 05/24/19   [name], MD  ondansetron (ZOFRAN) 8 MG tablet Take 1 tablet (8 mg total) by mouth 2 (two) times daily as needed (Nausea or vomiting). 05/24/19   [name], MD  prochlorperazine (COMPAZINE) 10 MG tablet Take 1 tablet (10 mg total) by mouth every 6 (six) hours as needed for nausea or vomiting. Patient not taking: Reported on 05/15/2019 02/04/19   [name], MD  prochlorperazine (COMPAZINE) 10 MG tablet Take 1 tablet (10 mg total) by mouth every 6 (six) hours as needed (Nausea or vomiting). 05/24/19   [name], MD    Family History Family History  Problem Relation Age of Onset  . Colitis Sister   . Cancer Sister 8       ovarian ca  . Parkinsonism Maternal Grandmother   . Heart disease Paternal Grandmother   . Heart disease Paternal Grandfather   . Cancer Cousin 12       paternal first cousin  . Skin cancer Son     Social History Social History   Tobacco Use  . Smoking status: Never Smoker  . Smokeless tobacco: Never Used  Substance Use Topics  . Alcohol use: Yes    Alcohol/week: 9.0 standard drinks    Types: 9 Glasses of wine per week    Comment: 9 GLASSES OF WINE A WEEK   . Drug use: No     Allergies   Patient has no known allergies.   Review of Systems Review of Systems  Constitutional: Negative for appetite change and fatigue.  HENT: Negative for congestion, ear discharge and sinus pressure.   Eyes: Negative for discharge.  Respiratory: Negative for cough.   Cardiovascular: Negative for chest pain.  Gastrointestinal: Positive for abdominal pain. Negative  for diarrhea.  Genitourinary: Negative for frequency and hematuria.  Musculoskeletal: Negative for back pain.  Skin: Negative for rash.  Neurological: Negative for seizures and headaches.  Psychiatric/Behavioral: Negative for hallucinations.     Physical Exam Updated Vital Signs BP 139/86 (BP Location: Right Arm)   Pulse 77   Temp 98.5 F (36.9 C) (Oral)   Resp 15   Ht 5\' 4"  (1.626 m)   Wt 63.5 kg   SpO2 100%   BMI 24.03 kg/m   Physical Exam Vitals signs and nursing note reviewed.  Constitutional:      Appearance: She  is well-developed.  HENT:     Head: Normocephalic.     Nose: Nose normal.  Eyes:     General: No scleral icterus.    Conjunctiva/sclera: Conjunctivae normal.  Neck:     Musculoskeletal: Neck supple.     Thyroid: No thyromegaly.  Cardiovascular:     Rate and Rhythm: Normal rate and regular rhythm.     Heart sounds: No murmur. No friction rub. No gallop.   Pulmonary:     Breath sounds: No stridor. No wheezing or rales.  Chest:     Chest wall: No tenderness.  Abdominal:     General: There is no distension.     Tenderness: There is abdominal tenderness. There is no rebound.     Comments: Tender left side of abdomen  Musculoskeletal: Normal range of motion.  Lymphadenopathy:     Cervical: No cervical adenopathy.  Skin:    Findings: No erythema or rash.  Neurological:     Mental Status: She is oriented to person, place, and time.     Motor: No abnormal muscle tone.     Coordination: Coordination normal.  Psychiatric:        Behavior: Behavior normal.      ED Treatments / Results  Labs (all labs ordered are listed, but only abnormal results are displayed) Labs Reviewed - No data to display  EKG EKG Interpretation  Date/Time:  Wednesday August 21 2019 20:38:35 EST Ventricular Rate:  70 PR Interval:    QRS Duration: 87 QT Interval:  389 QTC Calculation: 420 R Axis:   75 Text Interpretation: Sinus rhythm Confirmed by Milton Ferguson  [phone]) on 08/21/2019 9:26:16 PM   Radiology No results found.  Procedures Procedures (including critical care time)  Medications Ordered in ED Medications  oxyCODONE-acetaminophen (PERCOCET/ROXICET) 5-325 MG per tablet 1 tablet (has no administration in time range)     Initial Impression / Assessment and Plan / ED Course  I have reviewed the triage vital signs and the nursing notes.  Pertinent labs & imaging results that were available during my care of the patient were reviewed by me and considered in my medical decision making (see chart for details).        I believe patient's abdominal pain is related to the tumor she has a left upper quadrant and it is giving her referred pain to her shoulder from her diaphragm.  I told the patient we could go ahead and do her CT scan tonight to evaluate the pain but she wanted to go home and come back in the morning at her scheduled time for the CT.  Final Clinical Impressions(s) / ED Diagnoses   Final diagnoses:  Left upper quadrant abdominal pain    ED Discharge Orders    None       Milton Ferguson, MD 08/21/19 2131

## 2019-08-21 NOTE — Discharge Instructions (Addendum)
Take your pain medicine at home as prescribed. come back tomorrow morning for your CT scan.  Follow-up with your oncologist Friday as planned but if you are doing poorly before then you need to get in contact with them

## 2019-08-22 ENCOUNTER — Ambulatory Visit (HOSPITAL_COMMUNITY)
Admission: RE | Admit: 2019-08-22 | Discharge: 2019-08-22 | Disposition: A | Discharge status: Home / Self Care | Payer: Medicare Other | Source: Ambulatory Visit | Attending: Hematology and Oncology | Admitting: Hematology and Oncology

## 2019-08-22 ENCOUNTER — Telehealth: Payer: Self-pay | Admitting: Oncology

## 2019-08-22 ENCOUNTER — Other Ambulatory Visit: Payer: Self-pay | Admitting: Hematology and Oncology

## 2019-08-22 ENCOUNTER — Other Ambulatory Visit: Payer: Self-pay

## 2019-08-22 ENCOUNTER — Inpatient Hospital Stay: Payer: Medicare Other | Attending: Gynecologic Oncology | Admitting: Hematology and Oncology

## 2019-08-22 ENCOUNTER — Encounter (HOSPITAL_COMMUNITY): Payer: Self-pay

## 2019-08-22 VITALS — BP 151/72 | HR 85 | Temp 98.2°F | Resp 16 | Ht 64.0 in | Wt 142.9 lb

## 2019-08-22 DIAGNOSIS — G893 Neoplasm related pain (acute) (chronic): Secondary | ICD-10-CM

## 2019-08-22 DIAGNOSIS — C5702 Malignant neoplasm of left fallopian tube: Secondary | ICD-10-CM | POA: Diagnosis not present

## 2019-08-22 DIAGNOSIS — Z5111 Encounter for antineoplastic chemotherapy: Secondary | ICD-10-CM | POA: Diagnosis not present

## 2019-08-22 DIAGNOSIS — D61818 Other pancytopenia: Secondary | ICD-10-CM | POA: Insufficient documentation

## 2019-08-22 DIAGNOSIS — C5700 Malignant neoplasm of unspecified fallopian tube: Secondary | ICD-10-CM

## 2019-08-22 DIAGNOSIS — K429 Umbilical hernia without obstruction or gangrene: Secondary | ICD-10-CM | POA: Diagnosis not present

## 2019-08-22 DIAGNOSIS — C569 Malignant neoplasm of unspecified ovary: Secondary | ICD-10-CM | POA: Diagnosis not present

## 2019-08-22 DIAGNOSIS — Z7189 Other specified counseling: Secondary | ICD-10-CM | POA: Diagnosis not present

## 2019-08-22 DIAGNOSIS — D649 Anemia, unspecified: Secondary | ICD-10-CM | POA: Insufficient documentation

## 2019-08-22 MED ORDER — SODIUM CHLORIDE (PF) 0.9 % IJ SOLN
INTRAMUSCULAR | Status: AC
  Filled 2019-08-22: qty 50

## 2019-08-22 MED ORDER — MORPHINE SULFATE 15 MG PO TABS: 15.0000 mg | ORAL_TABLET | ORAL | 0 refills | Status: DC | PRN

## 2019-08-22 MED ORDER — IOHEXOL 300 MG/ML  SOLN
100.0000 mL | Freq: Once | INTRAMUSCULAR | Status: AC | PRN
  Administered 2019-08-22: 100 mL via INTRAVENOUS

## 2019-08-22 MED FILL — MORPHINE SULFATE IR 15 MG T: 15 | 5 days supply | Qty: 30 | Fill #0

## 2019-08-22 NOTE — Progress Notes (Signed)
DISCONTINUE ON PATHWAY REGIMEN - Ovarian     A cycle is every 28 days:     Bevacizumab-xxxx      Paclitaxel   **Always confirm dose/schedule in your pharmacy ordering system**  REASON: Disease Progression PRIOR TREATMENT: [id number]: Paclitaxel 80 mg/m2 Weekly + Bevacizumab 10 mg/kg q2 Weeks, q28 Days; Re-evaluate Every 3 Cycles, Treat until Complete Response, Unacceptable Toxicity, or Disease Progression TREATMENT RESPONSE: Progressive Disease (PD)  Ovarian - No Medical Intervention - Off Treatment.  Patient Characteristics: Recurrent or Progressive Disease, Third Line Therapy, Platinum Resistant or < 6 Months Since Last Platinum Therapy Therapeutic Status: Recurrent or Progressive Disease BRCA Mutation Status: Absent Line of Therapy: Third [employer]

## 2019-08-22 NOTE — Progress Notes (Signed)
DISCONTINUE ON PATHWAY REGIMEN - Ovarian  No Medical Intervention - Off Treatment.  REASON: Disease Progression PRIOR TREATMENT: Off Treatment  START ON PATHWAY REGIMEN - Ovarian     A cycle is every 21 days:     Gemcitabine   **Always confirm dose/schedule in your pharmacy ordering system**  Patient Characteristics: Recurrent or Progressive Disease, Fourth Line and Beyond, BRCA Mutation Absent/Unknown Therapeutic Status: Recurrent or Progressive Disease BRCA Mutation Status: Absent Line of Therapy: Fourth Line and Beyond  Intent of Therapy: Non-Curative / Palliative Intent, Discussed with Patient

## 2019-08-22 NOTE — Telephone Encounter (Signed)
Jessica Johnston called and said she is having sharp pain in her left arm, collarbone and left abdomen.  She said it started yesterday when she was on a walk.  She went to the ED last night because she was concerned that it was a cardiac issue.  She is still in severe pain 10/10 and thinks she needs pain medication.  She has been taking Tylenol without relief. She did have a CT abd/pelvis this morning.  Discussed with Dr. Alvy Bimler and appointment given for 11:30 today.

## 2019-08-23 ENCOUNTER — Inpatient Hospital Stay: Payer: Medicare Other

## 2019-08-23 ENCOUNTER — Ambulatory Visit: Payer: Medicare Other | Admitting: Hematology and Oncology

## 2019-08-23 ENCOUNTER — Telehealth: Payer: Self-pay | Admitting: Oncology

## 2019-08-23 ENCOUNTER — Inpatient Hospital Stay: Payer: Medicare Other | Admitting: Hematology and Oncology

## 2019-08-23 ENCOUNTER — Encounter: Payer: Self-pay | Admitting: Hematology and Oncology

## 2019-08-23 ENCOUNTER — Telehealth: Payer: Self-pay | Admitting: Hematology and Oncology

## 2019-08-23 NOTE — Telephone Encounter (Signed)
Jessica Johnston called and said she is feeling better today.  The morphine and tylenol are helping although she did throw up yesterday after taking it.  She has taken it this morning and hasn't had any nausea.  Advised her to eat a small amount before taking and afterwards to see if that helps.

## 2019-08-23 NOTE — Progress Notes (Signed)
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 The intent is of palliative intent.  I shared with her publication below:  Randomized Phase III Trial of Gemcitabine Compared With Pegylated Liposomal Doxorubicin in Patients With Platinum-Resistant Ovarian Cancer Lorenso Quarry. Arlyss Queen [place], Santiago Glad Teneriello, Willette Alma, Scott D. McMeekin, Meredeth Ide, Veleta Miners, [facility], [place]. Evonnie Pat, and [employer] [place] A B S T R A C T Purpose Ovarian cancer (OC) patients experiencing progressive disease (PD) within 6 months of platinum based therapy in the primary setting are considered platinum resistant (Pt-R). Currently, pegylated liposomal doxorubicin (PLD) is a standard of care for treatment of recurrent Pt-R disease. On the basis of promising phase II results, gemcitabine was compared with PLD for efficacy and safety in taxane-pretreated Pt-R OC patients. Patients and Methods Patients (n  195) with Pt-R OC were randomly assigned to either gemcitabine 1,000 mg/m2 (days 1 and 8; every 21 days) or PLD 50 mg/m2 (day 1; every 28 days) until PD or undue toxicity. Optional cross-over therapy was allowed at PD or at withdrawal because of toxicity. Primary end point was progression-free survival (PFS).  Additional end points included tumor response, time to treatment failure, survival, and quality of life. Results In the gemcitabine and PLD groups, median PFS was 3.6 v 3.1 months; median overall survival was 12.7 v 13.5 months; overall response rate (ORR) was 6.1% v 8.3%; and in the subset of patients with measurable disease, ORR was 9.2% v 11.7%, respectively. None of the efficacy end points showed a statistically significant difference between treatment groups. The PLD group experienced significantly more hand-foot syndrome and mucositis; the gemcitabine group experienced significantly more constipation, nausea/vomiting, fatigue, and neutropenia but not febrile neutropenia. Conclusion Although this was not designed as an equivalency study, gemcitabine and PLD seem to have a comparable therapeutic index in this population of Pt-R taxane-pretreated OC patients. Single agent gemcitabine may be an acceptable alternative to PLD for patients with platinum resistant ovarian cancer J Clin Oncol 25:2811-2818.  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 We reviewed pain management The patient have issues with taking pain medicine in the past due to fear of side effects I reminded patient that over-the-counter analgesics so far is not helpful and she is rating her pain at 10 out of 10 pain Ultimately, she agree with trial of morphine sulfate We will call her next week to see how she is doing with pain management We discussed risk of nausea and constipation We reviewed narcotic refill policy  Goals of care, counseling/discussion We have  extensive discussions about goals of care She understood goals of care is palliative in nature    No orders of the defined types were placed in this encounter.   INTERVAL HISTORY: Please see below for problem oriented charting. The patient is seen urgently due to uncontrolled cancer associated pain She went to the emergency department but left without further imaging study She did not show up for schedule CT imaging and proceed to be seen in the clinic to review test results She is rating her left upper quadrant pain is 10 out of 10 pain The pain is of sudden onset She was prescribed a small dose of Percocet in the emergency room but did not take it for fear of side effects of treatment She has tried over-the-counter analgesics but that was not helpful She denies recent changes in bowel habits No recent nausea vomiting  SUMMARY OF ONCOLOGIC HISTORY: Oncology History Overview Note  High grade serous, 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 Extensive omental nodularity highly worrisome for peritoneal carcinomatosis. Late recurrence of melanoma can present as peritoneal carcinomatosis. Ovarian cancer more commonly presents in this manner. Patient does have a predominately low density right adnexal lesion measuring up to 3.5 cm, although this lesion is not typical for ovarian cancer. 2. No evidence of bowel or ureteral obstruction. 3. No other definite signs of metastatic disease. There are small indeterminate low-density hepatic lesions.   02/08/2016 Tumor Marker   Patient's tumor was tested for the following markers: CA-125 Results of the tumor marker test revealed 656.2   02/15/2016  Procedure   CT-guided core biopsy performed of omental mass just deep to the abdominal wall.   02/15/2016 Pathology Results   Omentum, biopsy, left - PAPILLARY SEROUS NEOPLASM, SEE COMMENT. Microscopic Comment There are papillary collections of largely low grade appearing cells with numerous psammoma bodies. Given the limited material it is difficult to distinguish between invasive implants of a serous borderline tumor and serous carcinoma, especially given the low grade appearance.   02/18/2016 Pathology Results   1. Omentum, resection for tumor INVASIVE IMPLANT OF HIGH GRADE SEROUS CARCINOMA 2. Uterus +/- tubes/ovaries, neoplastic HIGH GRADE SEROUS CARCINOMA INVOLVING LEFT TUBAL FIMBRIA SEROUS CARCINOMA WITH PREDOMINANT PSAMMOMA BODIES IMPLANT AT UTERUS SEROSA, BILATERAL FALLOPIAN TUBAL SEROSA, AND ANTERIOR PERITONEAL REFLECTION CERVIX: HISTOLOGICAL UNREMARKABLE ENDOMETRIUM: INACTIVE ENDOMETRIUM MYOMETRIUM: LEIOMYOMA LEFT OVARY: CYSTADENOFIBROMA RIGHT OVARY AND FALLOPIAN TUBE: HISTOLOGICAL UNREMARKABLE Microscopic Comment 2. ONCOLOGY TABLE - FALLOPIAN TUBE 1. Specimen, including laterality: Omentum, uterus, bilateral ovaries and fallopian tubes 2. Procedure: Hysterectomy, bilateral salpingo-oophorectomy and tumor debulking omentectomy 3. Lymph node sampling performed: No 4. Tumor site: uterus serosa, bilateral fallopian tubal serosa, peritoneal and omentum 5. Tumor location in fallopian tube: Left fallopian tubal fimbria 6. Specimen integrity (intact/ruptured/disrupted): Intact 7. Tumor size (cm): multi focal invasive omentum implant greater than 2 cm, left fallopian tube tumor 0.8 cm. 8. Histologic type: Serous carcinoma 9. Grade: 3 10. Microscopic tumor extension: uterus serosa, bilateral fallopian tubal serosa, peritoneal and omentum 11. Margins: NA 12. Lymph-Vascular invasion: identified 13. Lymph nodes: # examined: 0; # positive: NA 14. TNM: pT3c, pNx 15. FIGO Stage (based on  pathologic findings, needs clinical correlation: IIIC 16. Comment:  High grade serous carcinoma multifocally and extensively involves left fallopian tubal fimbria, omentum, uterus serosa, bilateral fallopian tubal serosa and peritoneal. At the left fallopian tube there are small foci of serous tubal intraepithelial carcinoma identified, so we conclude the carcinoma is fallopian tube origin   03/09/2016 Tumor Marker   Patient's tumor was tested for the following markers: CA-125 Results of the tumor marker test revealed 154.4   03/15/2016 Procedure   Technically successful right IJ power-injectable port catheter placement. Ready for routine use.   03/18/2016 - 07/13/2016 Chemotherapy   The patient had 6 cycles of carboplatin and taxol   04/14/2016 Tumor Marker   Patient's tumor was tested for the following markers: CA-125 Results of the tumor marker test revealed 36.7   04/25/2016 Genetic Testing   Patient has genetic testing done for germline mutation Results revealed patient has no mutation   04/28/2016 Tumor Marker   Patient's tumor was tested for the following markers: CA-125 Results of the tumor marker test revealed 24.6   06/21/2016 Tumor Marker   Patient's tumor was tested for the following markers: CA-125 Results of the tumor marker test revealed 16.6   08/01/2016 Tumor Marker   Patient's tumor was tested for the following markers: CA-125 Results of the tumor marker test revealed 17.2   08/05/2016 Imaging   Interval TAH-BSO. No evidence of residual pelvic mass or metastatic disease within the abdomen or pelvis. No other acute findings.    11/04/2016 Tumor Marker   Patient's tumor was tested for the following markers: CA-125 Results of the tumor marker test revealed 13.6   01/20/2017 Tumor Marker   Patient's tumor was tested for the following markers: CA-125 Results of the tumor marker test revealed 13.8   04/14/2017 Tumor Marker   Patient's tumor was tested for the following  markers: CA-125 Results of the tumor marker test revealed 16.1   06/13/2017 Imaging   No acute findings.  No mass or hernia identified.  Colonic diverticulosis, without radiographic evidence of diverticulitis.  Aortic atherosclerosis.   07/03/2017 Tumor Marker   Patient's tumor was tested for the following markers: CA-125 Results of the tumor marker test revealed 18.4   09/19/2017 Tumor Marker   Patient's tumor was tested for the following markers: CA-125 Results of the tumor marker test revealed 18.5   01/23/2018 Tumor Marker   Patient's tumor was tested for the following markers: CA-125 Results of the tumor marker test revealed 35.4   01/30/2018 Imaging   1. No evidence of local fallopian tube carcinoma recurrence within the pelvis. Post hysterectomy. 2. No evidence of metastatic peritoneal disease or omental disease. No solid organ metastasis.    03/20/2018 Tumor Marker   Patient's tumor was tested for the following markers: CA-125 Results of the tumor marker test revealed 73   05/02/2018 Tumor Marker   Patient's tumor was tested for the following markers: CA-125 Results of the tumor marker test revealed 127.1   05/08/2018 Imaging   CT abdomen and pelvis 1. 7 cm left subdiaphragmatic collection of loculated fluid versus low-density soft tissue. Given the patient's history of rising CA 125, metastatic disease considered highly likely.  2. Areas of fluid or recurrent disease identified in the right lower quadrant adjacent to the cecum and along right lower quadrant small bowel loops.   05/14/2018 Tumor Marker   Patient's tumor was tested for the following markers: CA-125 Results of the tumor marker test revealed 166    Genetic Testing   Patient has genetic testing done  for ER/PR and MMR. Results revealed patient has the following on 02/18/2016 surgical pathology: ER 20%, PR 0% MMR: normal    Genetic Testing   Patient has genetic testing done for MSI. Results revealed  patient has the following: MSI stable   05/18/2018 - 12/31/2018 Chemotherapy   The patient had carboplatin and Doxil   07/16/2018 Tumor Marker   Patient's tumor was tested for the following markers: CA-125 Results of the tumor marker test revealed 28.7   08/10/2018 Imaging   CT imaging:  Decreased size of peritoneal soft tissue masses in left upper quadrant, consistent with decreased metastatic disease.  No new or progressive metastatic disease. No other acute findings.  Colonic diverticulosis, without radiographic evidence of diverticulitis.  Stable small paraumbilical ventral hernia containing transverse colon.   08/13/2018 Tumor Marker   Patient's tumor was tested for the following markers: CA-125 Results of the tumor marker test revealed 21.6   08/24/2018 Echocardiogram   LV EF: 60% -  65%   10/15/2018 Tumor Marker   Patient's tumor was tested for the following markers: CA-125 Results of the tumor marker test revealed 20.1   10/19/2018 Imaging   Ct scan of abdomen and pelvis Status post hysterectomy, bilateral salpingo-oophorectomy, and omentectomy.  Two peritoneal implants in the left upper abdomen are stable versus mildly decreased, as above. No abdominopelvic ascites.  No evidence of new/progressive metastatic disease.     11/19/2018 Tumor Marker   Patient's tumor was tested for the following markers: CA-125 Results of the tumor marker test revealed 21.7   11/23/2018 Echocardiogram   1. The left ventricle has normal systolic function of 00-17%. The cavity size was normal. There is no increased left ventricular wall thickness. Echo evidence of impaired diastolic relaxation.  2. The right ventricle has normal systolic function. The cavity was normal. There is no increase in right ventricular wall thickness.  3. The mitral valve is normal in structure. There is mild mitral annular calcification present.  4. The tricuspid valve is normal in structure.  5. The aortic  valve is tricuspid There is mild thickening of the aortic valve.  6. The pulmonic valve was normal in structure. Pulmonic valve regurgitation is mild by color flow Doppler.  7. Normal LV systolic function; mild diastolic dysfunction.   12/31/2018 Tumor Marker   Patient's tumor was tested for the following markers: CA-125 Results of the tumor marker test revealed 17.1   01/31/2019 Imaging   1. Left upper quadrant peritoneal implants described previously have decreased in the interval. No new or progressive findings in the abdomen/pelvis today. No free fluid. 2. Right paraumbilical ventral hernia contains a small knuckle of transverse colon without complicating features. 3.  Aortic Atherosclerois (ICD10-170.0)  Aortic Atherosclerosis (ICD10-I70.0).   01/31/2019 Tumor Marker   Patient's tumor was tested for the following markers: CA-125 Results of the tumor marker test revealed 18.9   02/11/2019 - 03/13/2019 Chemotherapy   The patient is taking Niraparib. She self discontinued after 1 month   02/25/2019 Tumor Marker   Patient's tumor was tested for the following markers: CA-125 Results of the tumor marker test revealed 18.6   05/13/2019 Tumor Marker   Patient's tumor was tested for the following markers: CA-125 Results of the tumor marker test revealed 230   05/22/2019 Imaging   1. Increased size of 4.6 cm soft tissue mass in the gastrosplenic ligament, consistent with metastatic disease. 2. No other sites of metastatic disease identified within the abdomen or pelvis. 3. Colonic diverticulosis. No  radiographic evidence of diverticulitis. 4. Stable small right paraumbilical hernia containing transverse colon.   Aortic Atherosclerosis (ICD10-I70.0).   05/31/2019 - 08/22/2019 Chemotherapy   The patient had bevacizumab and Taxol for chemotherapy treatment.     05/31/2019 Tumor Marker   Patient's tumor was tested for the following markers: CA-125 Results of the tumor marker test revealed  84   06/21/2019 Tumor Marker   Patient's tumor was tested for the following markers: CA-125 Results of the tumor marker test revealed 49.4.   07/19/2019 Tumor Marker   Patient's tumor was tested for the following markers: CA-125. Results of the tumor marker test revealed 44.5   08/22/2019 Imaging   1. Increase in size of 5.0 cm mass in the gastro splenic ligament consistent with metastatic disease. 2. No additional sites of disease identified within the abdomen or pelvis. 3. Unchanged paraumbilical hernia containing nonobstructed loop of large bowel. 4.  Aortic Atherosclerosis (ICD10-I70.0).     08/30/2019 -  Chemotherapy   The patient had gemcitabine (GEMZAR) 1,368 mg in sodium chloride 0.9 % 250 mL chemo infusion, 800 mg/m2 = 1,368 mg (80 % of original dose 1,000 mg/m2), Intravenous,  Once, 0 of 4 cycles Dose modification: 800 mg/m2 (80 % of original dose 1,000 mg/m2, Cycle 1, Reason: Provider Judgment)  for chemotherapy treatment.      REVIEW OF SYSTEMS:   Constitutional: Denies fevers, chills or abnormal weight loss Eyes: Denies blurriness of vision Ears, nose, mouth, throat, and face: Denies mucositis or sore throat Respiratory: Denies cough, dyspnea or wheezes Cardiovascular: Denies palpitation, chest discomfort or lower extremity swelling Gastrointestinal:  Denies nausea, heartburn or change in bowel habits Skin: Denies abnormal skin rashes Lymphatics: Denies new lymphadenopathy or easy bruising Neurological:Denies numbness, tingling or new weaknesses Behavioral/Psych: Mood is stable, no new changes  All other systems were reviewed with the patient and are negative.  I have reviewed the past medical history, past surgical history, social history and family history with the patient and they are unchanged from previous note.  ALLERGIES:  has No Known Allergies.  MEDICATIONS:  Current Outpatient Medications  Medication Sig Dispense Refill  . amLODipine (NORVASC) 5 MG  tablet TAKE ONE TABLET EACH DAY 30 tablet 1  . Cholecalciferol (VITAMIN D) 2000 units CAPS Take 1 capsule by mouth daily.    Marland Kitchen ezetimibe (ZETIA) 10 MG tablet Take 10 mg by mouth daily.    Marland Kitchen lidocaine-prilocaine (EMLA) cream Apply to affected area once 30 g 3  . morphine (MSIR) 15 MG tablet Take 1 tablet (15 mg total) by mouth every 4 (four) hours as needed for severe pain. 30 tablet 0  . ondansetron (ZOFRAN) 8 MG tablet Take 1 tablet (8 mg total) by mouth 2 (two) times daily as needed (Nausea or vomiting). 30 tablet 1  . prochlorperazine (COMPAZINE) 10 MG tablet Take 1 tablet (10 mg total) by mouth every 6 (six) hours as needed for nausea or vomiting. (Patient not taking: Reported on 05/15/2019) 30 tablet 1  . prochlorperazine (COMPAZINE) 10 MG tablet Take 1 tablet (10 mg total) by mouth every 6 (six) hours as needed (Nausea or vomiting). 30 tablet 1   No current facility-administered medications for this visit.     PHYSICAL EXAMINATION: ECOG PERFORMANCE STATUS: 1 - Symptomatic but completely ambulatory  Vitals:   08/22/19 1156  BP: (!) 151/72  Pulse: 85  Resp: 16  Temp: 98.2 F (36.8 C)  SpO2: 97%   Filed Weights   08/22/19 1156  Weight: 142  lb 14.4 oz (64.8 kg)    GENERAL:alert, no distress and comfortable Musculoskeletal:no cyanosis of digits and no clubbing  NEURO: alert & oriented x 3 with fluent speech, no focal motor/sensory deficits  LABORATORY DATA:  I have reviewed the data as listed    Component Value Date/Time   NA 139 08/09/2019 0852   NA 139 06/07/2017 1430   K 4.4 08/09/2019 0852   K 3.8 06/07/2017 1430   CL 107 08/09/2019 0852   CO2 23 08/09/2019 0852   CO2 27 06/07/2017 1430   GLUCOSE 101 (H) 08/09/2019 0852   GLUCOSE 124 06/07/2017 1430   BUN 11 08/09/2019 0852   BUN 14.0 06/07/2017 1430   CREATININE 0.74 08/09/2019 0852   CREATININE 0.8 06/07/2017 1430   CALCIUM 8.7 (L) 08/09/2019 0852   CALCIUM 9.3 06/07/2017 1430   PROT 6.3 (L) 08/09/2019 0852    PROT 6.6 09/30/2016 1027   ALBUMIN 3.4 (L) 08/09/2019 0852   ALBUMIN 3.5 09/30/2016 1027   AST 16 08/09/2019 0852   AST 18 09/30/2016 1027   ALT 21 08/09/2019 0852   ALT 21 09/30/2016 1027   ALKPHOS 67 08/09/2019 0852   ALKPHOS 77 09/30/2016 1027   BILITOT 0.4 08/09/2019 0852   BILITOT 0.37 09/30/2016 1027   GFRNONAA >60 08/09/2019 0852   GFRAA >60 08/09/2019 0852    No results found for: SPEP, UPEP  Lab Results  Component Value Date   WBC 4.2 08/09/2019   NEUTROABS 2.4 08/09/2019   HGB 10.7 (L) 08/09/2019   HCT 32.5 (L) 08/09/2019   MCV 91.8 08/09/2019   PLT 245 08/09/2019      Chemistry      Component Value Date/Time   NA 139 08/09/2019 0852   NA 139 06/07/2017 1430   K 4.4 08/09/2019 0852   K 3.8 06/07/2017 1430   CL 107 08/09/2019 0852   CO2 23 08/09/2019 0852   CO2 27 06/07/2017 1430   BUN 11 08/09/2019 0852   BUN 14.0 06/07/2017 1430   CREATININE 0.74 08/09/2019 0852   CREATININE 0.8 06/07/2017 1430      Component Value Date/Time   CALCIUM 8.7 (L) 08/09/2019 0852   CALCIUM 9.3 06/07/2017 1430   ALKPHOS 67 08/09/2019 0852   ALKPHOS 77 09/30/2016 1027   AST 16 08/09/2019 0852   AST 18 09/30/2016 1027   ALT 21 08/09/2019 0852   ALT 21 09/30/2016 1027   BILITOT 0.4 08/09/2019 0852   BILITOT 0.37 09/30/2016 1027       RADIOGRAPHIC STUDIES: I have reviewed multiple imaging studies with the patient I have personally reviewed the radiological images as listed and agreed with the findings in the report. Ct Abdomen Pelvis W Contrast  Result Date: 08/22/2019 CLINICAL DATA:  Epithelial ovarian/fallopian 2 cancer.  Restaging EXAM: CT ABDOMEN AND PELVIS WITH CONTRAST TECHNIQUE: Multidetector CT imaging of the abdomen and pelvis was performed using the standard protocol following bolus administration of intravenous contrast. CONTRAST:  128m OMNIPAQUE IOHEXOL 300 MG/ML  SOLN COMPARISON:  05/22/2019 FINDINGS: Lower chest: Subsegmental atelectasis identified  within the lung bases bilaterally. Mild pleural thickening within the lateral right middle lobe and right base is new from previous exam. No pleural effusion. Hepatobiliary: No focal liver abnormality is seen. No gallstones, gallbladder wall thickening, or biliary dilatation. Pancreas: Unremarkable. No pancreatic ductal dilatation or surrounding inflammatory changes. Spleen: Normal in size without focal abnormality. Adrenals/Urinary Tract: Normal appearance of the adrenal glands. Bilateral renal sinus cysts. No mass or hydronephrosis. Urinary  bladder appears within normal limits. Stomach/Bowel: Stomach is unremarkable. No bowel wall thickening, inflammation or distension identified. Appendix is visualized and appears normal. Periumbilical hernia is identified containing a nonobstructed loop of large bowel. Vascular/Lymphatic: Aortic atherosclerosis. No aneurysm. No abdominopelvic adenopathy identified. Reproductive: Previous hysterectomy.  No adnexal mass. Other: No ascites. Left upper quadrant solid and cystic mass measures 5.0 by 4.4 by 5.7 cm (volume = 66 cm^3). On the previous exam this measured 4.1 x 4.2 by 4.6 cm (volume = 41 cm^3). Musculoskeletal: No acute or significant osseous findings. IMPRESSION: 1. Increase in size of 5.0 cm mass in the gastro splenic ligament consistent with metastatic disease. 2. No additional sites of disease identified within the abdomen or pelvis. 3. Unchanged paraumbilical hernia containing nonobstructed loop of large bowel. 4.  Aortic Atherosclerosis (ICD10-I70.0). Electronically Signed   By: Kerby Moors M.D.   On: 08/22/2019 10:09    All questions were answered. The patient knows to call the clinic with any problems, questions or concerns. No barriers to learning was detected.  I spent 40 minutes counseling the patient face to face. The total time spent in the appointment was 55 minutes and more than 50% was on counseling and review of test results  [name],  MD 08/23/2019 7:56 AM

## 2019-08-23 NOTE — Assessment & Plan Note (Signed)
She has severe cancer associated pain We reviewed pain management The patient have issues with taking pain medicine in the past due to fear of side effects I reminded patient that over-the-counter analgesics so far is not helpful and she is rating her pain at 10 out of 10 pain Ultimately, she agree with trial of morphine sulfate We will call her next week to see how she is doing with pain management We discussed risk of nausea and constipation We reviewed narcotic refill policy

## 2019-08-23 NOTE — Telephone Encounter (Signed)
Confirmed next appointment for 11/13 with patient. Patient will confirm remaining appointments via mychart and get updated schedule 11/13.

## 2019-08-23 NOTE — Assessment & Plan Note (Signed)
We have extensive discussions about goals of care She understood goals of care is palliative in nature  

## 2019-08-23 NOTE — Assessment & Plan Note (Signed)
Unfortunately, she progressed on recent chemotherapy with paclitaxel and bevacizumab She is extremely symptomatic with pain We discussed pain management We also reviewed the current NCCN guidelines about treatment options The patient would like to continue palliative chemotherapy We briefly discussed various treatment options including single agent Taxotere, gemcitabine or topotecan Some of the risks, benefits, side effects of each treatment options were reviewed with the patient  We discussed the role of chemotherapy. The intent is of palliative intent.  I shared with her publication below:  Randomized Phase III Trial of Gemcitabine Compared With Pegylated Liposomal Doxorubicin in Patients With Platinum-Resistant Ovarian Cancer Lorenso Quarry. Arlyss Queen [place], Santiago Glad Teneriello, Willette Alma, Scott D. McMeekin, Meredeth Ide, Veleta Miners, [facility], [place]. Evonnie Pat, and [employer] [place] A B S T R A C T Purpose Ovarian cancer (OC) patients experiencing progressive disease (PD) within 6 months of platinum based therapy in the primary setting are considered platinum resistant (Pt-R). Currently, pegylated liposomal doxorubicin (PLD) is a standard of care for treatment of recurrent Pt-R disease. On the basis of promising phase II results, gemcitabine was compared with PLD for efficacy and safety in taxane-pretreated Pt-R OC patients. Patients and Methods Patients (n  195) with Pt-R OC were randomly assigned to either gemcitabine 1,000 mg/m2 (days 1 and 8; every 21 days) or PLD 50 mg/m2 (day 1; every 28 days) until PD or undue toxicity. Optional cross-over therapy was allowed at PD or at withdrawal because of toxicity. Primary end point was progression-free survival (PFS). Additional end points included tumor response, time to treatment failure, survival, and quality of life. Results In the gemcitabine and PLD groups, median PFS was 3.6 v 3.1 months; median  overall survival was 12.7 v 13.5 months; overall response rate (ORR) was 6.1% v 8.3%; and in the subset of patients with measurable disease, ORR was 9.2% v 11.7%, respectively. None of the efficacy end points showed a statistically significant difference between treatment groups. The PLD group experienced significantly more hand-foot syndrome and mucositis; the gemcitabine group experienced significantly more constipation, nausea/vomiting, fatigue, and neutropenia but not febrile neutropenia. Conclusion Although this was not designed as an equivalency study, gemcitabine and PLD seem to have a comparable therapeutic index in this population of Pt-R taxane-pretreated OC patients. Single agent gemcitabine may be an acceptable alternative to PLD for patients with platinum resistant ovarian cancer J Clin Oncol 25:2811-2818.  2007 by American Society of Clinical Oncology  The risks, benefits, side effects of gemcitabine were reviewed with the patient and ultimately she agreed to proceed with this treatment option Due to her prior risk of pancytopenia, I plan to reduce the dose of gemcitabine upfront I recommend minimum 3 months of treatment before repeat imaging study

## 2019-08-26 ENCOUNTER — Telehealth: Payer: Self-pay | Admitting: *Deleted

## 2019-08-26 ENCOUNTER — Telehealth: Payer: Self-pay | Admitting: Oncology

## 2019-08-26 NOTE — Telephone Encounter (Signed)
Jessica Johnston called and said her pain is much better.  She just has a mild feeling now in her abdomen. She has been taking 1/2 a tablet of morphine and tylenol every 4 hours while awake.  She is wondering if she can back off of the morphine now that the pain is better.  She is worried about constipation although she did mention she had a good bowel movement today. She is also asking if it is ok to bring her husband for her next appointment with Dr. Alvy Bimler.

## 2019-08-26 NOTE — Telephone Encounter (Signed)
Called Davisha back with message from Dr. Alvy Bimler.  She verbalized understanding.

## 2019-08-26 NOTE — Telephone Encounter (Signed)
I will leave it up to her to decide; if pain is severe, she can resume morphine. She is in control Yes, her husband can come with her to visit, just not in infusion room

## 2019-08-26 NOTE — Telephone Encounter (Signed)
A gentleman called and stated "I'm Jessica Johnston's husband. I would like to speak with either Melissa or Dr. Denman George. [name] had surgery couple years and chemo. The tumor is growing and I just wanted to talk to them about if surgery would help. She is now on morphine for the pain. They can call me at [phone]."

## 2019-08-30 ENCOUNTER — Inpatient Hospital Stay: Payer: Medicare Other

## 2019-08-30 ENCOUNTER — Inpatient Hospital Stay (HOSPITAL_BASED_OUTPATIENT_CLINIC_OR_DEPARTMENT_OTHER): Payer: Medicare Other | Admitting: Hematology and Oncology

## 2019-08-30 ENCOUNTER — Other Ambulatory Visit: Payer: Self-pay

## 2019-08-30 ENCOUNTER — Other Ambulatory Visit: Payer: Self-pay | Admitting: Hematology and Oncology

## 2019-08-30 ENCOUNTER — Encounter: Payer: Self-pay | Admitting: Hematology and Oncology

## 2019-08-30 DIAGNOSIS — D61818 Other pancytopenia: Secondary | ICD-10-CM

## 2019-08-30 DIAGNOSIS — D649 Anemia, unspecified: Secondary | ICD-10-CM | POA: Diagnosis not present

## 2019-08-30 DIAGNOSIS — C5702 Malignant neoplasm of left fallopian tube: Secondary | ICD-10-CM

## 2019-08-30 DIAGNOSIS — C5700 Malignant neoplasm of unspecified fallopian tube: Secondary | ICD-10-CM

## 2019-08-30 DIAGNOSIS — G893 Neoplasm related pain (acute) (chronic): Secondary | ICD-10-CM

## 2019-08-30 DIAGNOSIS — Z7189 Other specified counseling: Secondary | ICD-10-CM

## 2019-08-30 DIAGNOSIS — Z5111 Encounter for antineoplastic chemotherapy: Secondary | ICD-10-CM | POA: Diagnosis not present

## 2019-08-30 DIAGNOSIS — Z95828 Presence of other vascular implants and grafts: Secondary | ICD-10-CM

## 2019-08-30 LAB — CMP (CANCER CENTER ONLY)
ALT: 17 U/L (ref 0–44)
AST: 13 U/L — ABNORMAL LOW (ref 15–41)
Albumin: 3.1 g/dL — ABNORMAL LOW (ref 3.5–5.0)
Alkaline Phosphatase: 82 U/L (ref 38–126)
Anion gap: 11 (ref 5–15)
BUN: 12 mg/dL (ref 8–23)
CO2: 22 mmol/L (ref 22–32)
Calcium: 8.8 mg/dL — ABNORMAL LOW (ref 8.9–10.3)
Chloride: 105 mmol/L (ref 98–111)
Creatinine: 0.71 mg/dL (ref 0.44–1.00)
GFR, Est AFR Am: >60 mL/min (ref >60)
GFR, Estimated: >60 mL/min (ref >60)
Glucose, Bld: 96 mg/dL (ref 70–99)
Potassium: 4.7 mmol/L (ref 3.5–5.1)
Sodium: 138 mmol/L (ref 135–145)
Total Bilirubin: <0.2 mg/dL — ABNORMAL LOW (ref 0.3–1.2)
Total Protein: 6.9 g/dL (ref 6.5–8.1)

## 2019-08-30 LAB — CBC WITH DIFFERENTIAL (CANCER CENTER ONLY)
Abs Immature Granulocytes: 0.04 10*3/uL (ref 0.00–0.07)
Basophils Absolute: 0 10*3/uL (ref 0.0–0.1)
Basophils Relative: 0 %
Eosinophils Absolute: 0.1 10*3/uL (ref 0.0–0.5)
Eosinophils Relative: 1 %
HCT: 32.9 % — ABNORMAL LOW (ref 36.0–46.0)
Hemoglobin: 10.5 g/dL — ABNORMAL LOW (ref 12.0–15.0)
Immature Granulocytes: 1 %
Lymphocytes Relative: 18 %
Lymphs Abs: 1.1 10*3/uL (ref 0.7–4.0)
MCH: 29.8 pg (ref 26.0–34.0)
MCHC: 31.9 g/dL (ref 30.0–36.0)
MCV: 93.5 fL (ref 80.0–100.0)
Monocytes Absolute: 0.7 10*3/uL (ref 0.1–1.0)
Monocytes Relative: 12 %
Neutro Abs: 4.1 10*3/uL (ref 1.7–7.7)
Neutrophils Relative %: 68 %
Platelet Count: 342 10*3/uL (ref 150–400)
RBC: 3.52 MIL/uL — ABNORMAL LOW (ref 3.87–5.11)
RDW: 14.5 % (ref 11.5–15.5)
WBC Count: 6 10*3/uL (ref 4.0–10.5)
nRBC: 0 % (ref 0.0–0.2)

## 2019-08-30 MED ORDER — SODIUM CHLORIDE 0.9% FLUSH
10.0000 mL | INTRAVENOUS | Status: DC | PRN
  Administered 2019-08-30: 10 mL
  Filled 2019-08-30: qty 10

## 2019-08-30 MED ORDER — SODIUM CHLORIDE 0.9% FLUSH
10.0000 mL | Freq: Once | INTRAVENOUS | Status: AC
  Administered 2019-08-30: 10 mL
  Filled 2019-08-30: qty 10

## 2019-08-30 MED ORDER — SODIUM CHLORIDE 0.9 % IV SOLN
Freq: Once | INTRAVENOUS | Status: AC
  Administered 2019-08-30: 12:00:00 via INTRAVENOUS
  Filled 2019-08-30: qty 250

## 2019-08-30 MED ORDER — PROCHLORPERAZINE MALEATE 10 MG PO TABS
10.0000 mg | ORAL_TABLET | Freq: Once | ORAL | Status: AC
  Administered 2019-08-30: 10 mg via ORAL

## 2019-08-30 MED ORDER — SODIUM CHLORIDE 0.9 % IV SOLN
800.0000 mg/m2 | Freq: Once | INTRAVENOUS | Status: AC
  Administered 2019-08-30: 1368 mg via INTRAVENOUS
  Filled 2019-08-30: qty 35.98

## 2019-08-30 MED ORDER — HEPARIN SOD (PORK) LOCK FLUSH 100 UNIT/ML IV SOLN
500.0000 [IU] | Freq: Once | INTRAVENOUS | Status: AC | PRN
  Administered 2019-08-30: 500 [IU]
  Filled 2019-08-30: qty 5

## 2019-08-30 MED ORDER — PROCHLORPERAZINE MALEATE 10 MG PO TABS
ORAL_TABLET | ORAL | Status: AC
  Filled 2019-08-30: qty 1

## 2019-08-30 NOTE — Patient Instructions (Signed)

## 2019-08-30 NOTE — Assessment & Plan Note (Signed)
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

## 2019-08-30 NOTE — Assessment & Plan Note (Signed)
We reviewed the plan of care again today Her husband is present Both the patient and her husband understood why surgery is not an option She is in agreement to proceed with palliative chemotherapy with gemcitabine I will see her next month for further toxicity review For now, she will get treatment on day 1 and 8, for cycle of every 21 days

## 2019-08-30 NOTE — Patient Instructions (Addendum)
[facility] Discharge Instructions for Patients Receiving Chemotherapy  Today you received the following chemotherapy agents gemzar   To help prevent nausea and vomiting after your treatment, we encourage you to take your nausea medication as directed   If you develop nausea and vomiting that is not controlled by your nausea medication, call the clinic.   BELOW ARE SYMPTOMS THAT SHOULD BE REPORTED IMMEDIATELY:  *FEVER GREATER THAN 100.5 F  *CHILLS WITH OR WITHOUT FEVER  NAUSEA AND VOMITING THAT IS NOT CONTROLLED WITH YOUR NAUSEA MEDICATION  *UNUSUAL SHORTNESS OF BREATH  *UNUSUAL BRUISING OR BLEEDING  TENDERNESS IN MOUTH AND THROAT WITH OR WITHOUT PRESENCE OF ULCERS  *URINARY PROBLEMS  *BOWEL PROBLEMS  UNUSUAL RASH Items with * indicate a potential emergency and should be followed up as soon as possible.  Feel free to call the clinic you have any questions or concerns. The clinic phone number is [phone].  Gemcitabine injection What is this medicine? GEMCITABINE (jem SYE ta been) is a chemotherapy drug. This medicine is used to treat many types of cancer like breast cancer, lung cancer, pancreatic cancer, and ovarian cancer. This medicine may be used for other purposes; ask your health care provider or pharmacist if you have questions. COMMON BRAND NAME(S): Gemzar, Infugem What should I tell my health care provider before I take this medicine? They need to know if you have any of these conditions:  blood disorders  infection  kidney disease  liver disease  lung or breathing disease, like asthma  recent or ongoing radiation therapy  an unusual or allergic reaction to gemcitabine, other chemotherapy, other medicines, foods, dyes, or preservatives  pregnant or trying to get pregnant  breast-feeding How should I use this medicine? This drug is given as an infusion into a vein. It is administered in a hospital or clinic by a specially trained  health care professional. Talk to your pediatrician regarding the use of this medicine in children. Special care may be needed. Overdosage: If you think you have taken too much of this medicine contact a poison control center or emergency room at once. NOTE: This medicine is only for you. Do not share this medicine with others. What if I miss a dose? It is important not to miss your dose. Call your doctor or health care professional if you are unable to keep an appointment. What may interact with this medicine?  medicines to increase blood counts like filgrastim, pegfilgrastim, sargramostim  some other chemotherapy drugs like cisplatin  vaccines Talk to your doctor or health care professional before taking any of these medicines:  acetaminophen  aspirin  ibuprofen  ketoprofen  naproxen This list may not describe all possible interactions. Give your health care provider a list of all the medicines, herbs, non-prescription drugs, or dietary supplements you use. Also tell them if you smoke, drink alcohol, or use illegal drugs. Some items may interact with your medicine. What should I watch for while using this medicine? Visit your doctor for checks on your progress. This drug may make you feel generally unwell. This is not uncommon, as chemotherapy can affect healthy cells as well as cancer cells. Report any side effects. Continue your course of treatment even though you feel ill unless your doctor tells you to stop. In some cases, you may be given additional medicines to help with side effects. Follow all directions for their use. Call your doctor or health care professional for advice if you get a fever, chills or sore  throat, or other symptoms of a cold or flu. Do not treat yourself. This drug decreases your body's ability to fight infections. Try to avoid being around people who are sick. This medicine may increase your risk to bruise or bleed. Call your doctor or health care  professional if you notice any unusual bleeding. Be careful brushing and flossing your teeth or using a toothpick because you may get an infection or bleed more easily. If you have any dental work done, tell your dentist you are receiving this medicine. Avoid taking products that contain aspirin, acetaminophen, ibuprofen, naproxen, or ketoprofen unless instructed by your doctor. These medicines may hide a fever. Do not become pregnant while taking this medicine or for 6 months after stopping it. Women should inform their doctor if they wish to become pregnant or think they might be pregnant. Men should not father a child while taking this medicine and for 3 months after stopping it. There is a potential for serious side effects to an unborn child. Talk to your health care professional or pharmacist for more information. Do not breast-feed an infant while taking this medicine or for at least 1 week after stopping it. Men should inform their doctors if they wish to father a child. This medicine may lower sperm counts. Talk with your doctor or health care professional if you are concerned about your fertility. What side effects may I notice from receiving this medicine? Side effects that you should report to your doctor or health care professional as soon as possible:  allergic reactions like skin rash, itching or hives, swelling of the face, lips, or tongue  breathing problems  pain, redness, or irritation at site where injected  signs and symptoms of a dangerous change in heartbeat or heart rhythm like chest pain; dizziness; fast or irregular heartbeat; palpitations; feeling faint or lightheaded, falls; breathing problems  signs of decreased platelets or bleeding - bruising, pinpoint red spots on the skin, black, tarry stools, blood in the urine  signs of decreased red blood cells - unusually weak or tired, feeling faint or lightheaded, falls  signs of infection - fever or chills, cough, sore  throat, pain or difficulty passing urine  signs and symptoms of kidney injury like trouble passing urine or change in the amount of urine  signs and symptoms of liver injury like dark yellow or brown urine; general ill feeling or flu-like symptoms; light-colored stools; loss of appetite; nausea; right upper belly pain; unusually weak or tired; yellowing of the eyes or skin  swelling of ankles, feet, hands Side effects that usually do not require medical attention (report to your doctor or health care professional if they continue or are bothersome):  constipation  diarrhea  hair loss  loss of appetite  nausea  rash  vomiting This list may not describe all possible side effects. Call your doctor for medical advice about side effects. You may report side effects to FDA at 1-800-FDA-1088. Where should I keep my medicine? This drug is given in a hospital or clinic and will not be stored at home. NOTE: This sheet is a summary. It may not cover all possible information. If you have questions about this medicine, talk to your doctor, pharmacist, or health care provider.  2020 Elsevier/Gold Standard (2017-12-27 18:06:11)

## 2019-08-30 NOTE — Progress Notes (Signed)
[facility] OFFICE PROGRESS NOTE  Patient Care Team: [name], MD as PCP - General (Internal Medicine)  ASSESSMENT & PLAN:  Fallopian tube cancer, carcinoma ([place]) We reviewed the plan of care again today Her husband is present Both the patient and her husband understood why surgery is not an option She is in agreement to proceed with palliative chemotherapy with gemcitabine I will see her next month for further toxicity review For now, she will get treatment on day 1 and 8, for cycle of every 21 days  Cancer associated pain She has no pain today She will continue to take morphine as needed  Pancytopenia, acquired ([place]) She has history of intermittent pancytopenia in the past Today, she is mildly anemic but not symptomatic We will observe closely We will proceed with minor dose adjustment upfront because of a history of severe pancytopenia She does not need prophylactic G-CSF support   No orders of the defined types were placed in this encounter.   INTERVAL HISTORY: Please see below for problem oriented charting. 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 Extensive omental nodularity highly worrisome for peritoneal carcinomatosis. Late recurrence of  melanoma can present as peritoneal carcinomatosis. Ovarian cancer more commonly presents in this manner. Patient does have a predominately low density right adnexal lesion measuring up to 3.5 cm, although this lesion is not typical for ovarian cancer. 2. No evidence of bowel or ureteral obstruction. 3. No other definite signs of metastatic disease. There are small indeterminate low-density hepatic lesions.   02/08/2016 Tumor Marker   Patient's tumor was tested for the following markers: CA-125 Results of the tumor marker test revealed 656.2   02/15/2016 Procedure   CT-guided core biopsy performed of omental mass just deep to the abdominal wall.   02/15/2016 Pathology Results   Omentum, biopsy, left - PAPILLARY SEROUS NEOPLASM, SEE COMMENT. Microscopic Comment There are papillary collections of largely low grade appearing cells with numerous psammoma bodies. Given the limited material it is difficult to distinguish between invasive implants of a serous borderline tumor and serous carcinoma, especially given the low grade appearance.   02/18/2016 Pathology Results   1. Omentum, resection for tumor INVASIVE IMPLANT OF HIGH GRADE SEROUS CARCINOMA 2. Uterus +/- tubes/ovaries, neoplastic HIGH GRADE SEROUS CARCINOMA INVOLVING LEFT TUBAL FIMBRIA SEROUS CARCINOMA WITH PREDOMINANT PSAMMOMA BODIES IMPLANT AT UTERUS SEROSA, BILATERAL FALLOPIAN TUBAL SEROSA, AND ANTERIOR PERITONEAL REFLECTION CERVIX: HISTOLOGICAL UNREMARKABLE ENDOMETRIUM: INACTIVE ENDOMETRIUM MYOMETRIUM: LEIOMYOMA LEFT OVARY: CYSTADENOFIBROMA RIGHT OVARY AND FALLOPIAN TUBE: HISTOLOGICAL UNREMARKABLE Microscopic Comment 2. ONCOLOGY TABLE - FALLOPIAN TUBE 1. Specimen, including laterality: Omentum, uterus, bilateral ovaries and fallopian tubes 2. Procedure: Hysterectomy, bilateral salpingo-oophorectomy and tumor debulking omentectomy 3. Lymph node sampling performed: No 4. Tumor site: uterus serosa, bilateral fallopian tubal serosa,  peritoneal  and omentum 5. Tumor location in fallopian tube: Left fallopian tubal fimbria 6. Specimen integrity (intact/ruptured/disrupted): Intact 7. Tumor size (cm): multi focal invasive omentum implant greater than 2 cm, left fallopian tube tumor 0.8 cm. 8. Histologic type: Serous carcinoma 9. Grade: 3 10. Microscopic tumor extension: uterus serosa, bilateral fallopian tubal serosa, peritoneal and omentum 11. Margins: NA 12. Lymph-Vascular invasion: identified 13. Lymph nodes: # examined: 0; # positive: NA 14. TNM: pT3c, pNx 15. FIGO Stage (based on pathologic findings, needs clinical correlation: IIIC 16. Comment: High grade serous carcinoma multifocally and extensively involves left fallopian tubal fimbria, omentum, uterus serosa, bilateral fallopian tubal serosa and peritoneal. At the left fallopian tube there are small foci of serous tubal intraepithelial carcinoma identified, so we conclude the carcinoma is fallopian tube origin   03/09/2016 Tumor Marker   Patient's tumor was tested for the following markers: CA-125 Results of the tumor marker test revealed 154.4   03/15/2016 Procedure   Technically successful right IJ power-injectable port catheter placement. Ready for routine use.   03/18/2016 - 07/13/2016 Chemotherapy   The patient had 6 cycles of carboplatin and taxol   04/14/2016 Tumor Marker   Patient's tumor was tested for the following markers: CA-125 Results of the tumor marker test revealed 36.7   04/25/2016 Genetic Testing   Patient has genetic testing done for germline mutation Results revealed patient has no mutation   04/28/2016 Tumor Marker   Patient's tumor was tested for the following markers: CA-125 Results of the tumor marker test revealed 24.6   06/21/2016 Tumor Marker   Patient's tumor was tested for the following markers: CA-125 Results of the tumor marker test revealed 16.6   08/01/2016 Tumor Marker   Patient's tumor was tested for the following markers:  CA-125 Results of the tumor marker test revealed 17.2   08/05/2016 Imaging   Interval TAH-BSO. No evidence of residual pelvic mass or metastatic disease within the abdomen or pelvis. No other acute findings.    11/04/2016 Tumor Marker   Patient's tumor was tested for the following markers: CA-125 Results of the tumor marker test revealed 13.6   01/20/2017 Tumor Marker   Patient's tumor was tested for the following markers: CA-125 Results of the tumor marker test revealed 13.8   04/14/2017 Tumor Marker   Patient's tumor was tested for the following markers: CA-125 Results of the tumor marker test revealed 16.1   06/13/2017 Imaging   No acute findings.  No mass or hernia identified.  Colonic diverticulosis, without radiographic evidence of diverticulitis.  Aortic atherosclerosis.   07/03/2017 Tumor Marker   Patient's tumor was tested for the following markers: CA-125 Results of the tumor marker test revealed 18.4   09/19/2017 Tumor Marker   Patient's tumor was tested for the following markers: CA-125 Results of the tumor marker test revealed 18.5   01/23/2018 Tumor Marker   Patient's tumor was tested for the following markers: CA-125 Results of the tumor marker test revealed 35.4   01/30/2018 Imaging   1. No evidence of local fallopian tube carcinoma recurrence within the pelvis. Post hysterectomy. 2. No evidence of metastatic peritoneal disease or omental disease. No solid organ metastasis.    03/20/2018 Tumor Marker   Patient's tumor was tested for the following markers: CA-125 Results of the tumor marker test revealed 73   05/02/2018 Tumor Marker   Patient's tumor was tested for the following markers: CA-125 Results of the tumor marker test revealed 127.1   05/08/2018 Imaging   CT abdomen  and pelvis 1. 7 cm left subdiaphragmatic collection of loculated fluid versus low-density soft tissue. Given the patient's history of rising CA 125, metastatic disease considered highly  likely.  2. Areas of fluid or recurrent disease identified in the right lower quadrant adjacent to the cecum and along right lower quadrant small bowel loops.   05/14/2018 Tumor Marker   Patient's tumor was tested for the following markers: CA-125 Results of the tumor marker test revealed 166    Genetic Testing   Patient has genetic testing done for ER/PR and MMR. Results revealed patient has the following on 02/18/2016 surgical pathology: ER 20%, PR 0% MMR: normal    Genetic Testing   Patient has genetic testing done for MSI. Results revealed patient has the following: MSI stable   05/18/2018 - 12/31/2018 Chemotherapy   The patient had carboplatin and Doxil   07/16/2018 Tumor Marker   Patient's tumor was tested for the following markers: CA-125 Results of the tumor marker test revealed 28.7   08/10/2018 Imaging   CT imaging:  Decreased size of peritoneal soft tissue masses in left upper quadrant, consistent with decreased metastatic disease.  No new or progressive metastatic disease. No other acute findings.  Colonic diverticulosis, without radiographic evidence of diverticulitis.  Stable small paraumbilical ventral hernia containing transverse colon.   08/13/2018 Tumor Marker   Patient's tumor was tested for the following markers: CA-125 Results of the tumor marker test revealed 21.6   08/24/2018 Echocardiogram   LV EF: 60% -  65%   10/15/2018 Tumor Marker   Patient's tumor was tested for the following markers: CA-125 Results of the tumor marker test revealed 20.1   10/19/2018 Imaging   Ct scan of abdomen and pelvis Status post hysterectomy, bilateral salpingo-oophorectomy, and omentectomy.  Two peritoneal implants in the left upper abdomen are stable versus mildly decreased, as above. No abdominopelvic ascites.  No evidence of new/progressive metastatic disease.     11/19/2018 Tumor Marker   Patient's tumor was tested for the following markers: CA-125 Results  of the tumor marker test revealed 21.7   11/23/2018 Echocardiogram   1. The left ventricle has normal systolic function of 51-70%. The cavity size was normal. There is no increased left ventricular wall thickness. Echo evidence of impaired diastolic relaxation.  2. The right ventricle has normal systolic function. The cavity was normal. There is no increase in right ventricular wall thickness.  3. The mitral valve is normal in structure. There is mild mitral annular calcification present.  4. The tricuspid valve is normal in structure.  5. The aortic valve is tricuspid There is mild thickening of the aortic valve.  6. The pulmonic valve was normal in structure. Pulmonic valve regurgitation is mild by color flow Doppler.  7. Normal LV systolic function; mild diastolic dysfunction.   12/31/2018 Tumor Marker   Patient's tumor was tested for the following markers: CA-125 Results of the tumor marker test revealed 17.1   01/31/2019 Imaging   1. Left upper quadrant peritoneal implants described previously have decreased in the interval. No new or progressive findings in the abdomen/pelvis today. No free fluid. 2. Right paraumbilical ventral hernia contains a small knuckle of transverse colon without complicating features. 3.  Aortic Atherosclerois (ICD10-170.0)  Aortic Atherosclerosis (ICD10-I70.0).   01/31/2019 Tumor Marker   Patient's tumor was tested for the following markers: CA-125 Results of the tumor marker test revealed 18.9   02/11/2019 - 03/13/2019 Chemotherapy   The patient is taking Niraparib. She self discontinued after  1 month   02/25/2019 Tumor Marker   Patient's tumor was tested for the following markers: CA-125 Results of the tumor marker test revealed 18.6   05/13/2019 Tumor Marker   Patient's tumor was tested for the following markers: CA-125 Results of the tumor marker test revealed 230   05/22/2019 Imaging   1. Increased size of 4.6 cm soft tissue mass in the gastrosplenic  ligament, consistent with metastatic disease. 2. No other sites of metastatic disease identified within the abdomen or pelvis. 3. Colonic diverticulosis. No radiographic evidence of diverticulitis. 4. Stable small right paraumbilical hernia containing transverse colon.   Aortic Atherosclerosis (ICD10-I70.0).   05/31/2019 - 08/22/2019 Chemotherapy   The patient had bevacizumab and Taxol for chemotherapy treatment.     05/31/2019 Tumor Marker   Patient's tumor was tested for the following markers: CA-125 Results of the tumor marker test revealed 84   06/21/2019 Tumor Marker   Patient's tumor was tested for the following markers: CA-125 Results of the tumor marker test revealed 49.4.   07/19/2019 Tumor Marker   Patient's tumor was tested for the following markers: CA-125. Results of the tumor marker test revealed 44.5   08/22/2019 Imaging   1. Increase in size of 5.0 cm mass in the gastro splenic ligament consistent with metastatic disease. 2. No additional sites of disease identified within the abdomen or pelvis. 3. Unchanged paraumbilical hernia containing nonobstructed loop of large bowel. 4.  Aortic Atherosclerosis (ICD10-I70.0).       REVIEW OF SYSTEMS:   Constitutional: Denies fevers, chills or abnormal weight loss Eyes: Denies blurriness of vision Ears, nose, mouth, throat, and face: Denies mucositis or sore throat Respiratory: Denies cough, dyspnea or wheezes Cardiovascular: Denies palpitation, chest discomfort or lower extremity swelling Gastrointestinal:  Denies nausea, heartburn or change in bowel habits Skin: Denies abnormal skin rashes Lymphatics: Denies new lymphadenopathy or easy bruising Neurological:Denies numbness, tingling or new weaknesses Behavioral/Psych: Mood is stable, no new changes  All other systems were reviewed with the patient and are negative.  I have reviewed the past medical history, past surgical history, social history and family history with the  patient and they are unchanged from previous note.  ALLERGIES:  has No Known Allergies.  MEDICATIONS:  Current Outpatient Medications  Medication Sig Dispense Refill  . Cholecalciferol (VITAMIN D) 2000 units CAPS Take 1 capsule by mouth daily.    Marland Kitchen ezetimibe (ZETIA) 10 MG tablet Take 10 mg by mouth daily.    Marland Kitchen lidocaine-prilocaine (EMLA) cream Apply to affected area once 30 g 3  . morphine (MSIR) 15 MG tablet Take 1 tablet (15 mg total) by mouth every 4 (four) hours as needed for severe pain. 30 tablet 0  . ondansetron (ZOFRAN) 8 MG tablet Take 1 tablet (8 mg total) by mouth 2 (two) times daily as needed (Nausea or vomiting). 30 tablet 1  . prochlorperazine (COMPAZINE) 10 MG tablet Take 1 tablet (10 mg total) by mouth every 6 (six) hours as needed (Nausea or vomiting). 30 tablet 1   No current facility-administered medications for this visit.     PHYSICAL EXAMINATION: ECOG PERFORMANCE STATUS: 1 - Symptomatic but completely ambulatory  Vitals:   08/30/19 1139  BP: 124/61  Pulse: 66  Resp: 17  Temp: 97.9 F (36.6 C)  SpO2: 100%   Filed Weights   08/30/19 1139  Weight: 143 lb 4.8 oz (65 kg)    GENERAL:alert, no distress and comfortable SKIN: skin color, texture, turgor are normal, no rashes or significant  lesions EYES: normal, Conjunctiva are pink and non-injected, sclera clear OROPHARYNX:no exudate, no erythema and lips, buccal mucosa, and tongue normal  NECK: supple, thyroid normal size, non-tender, without nodularity LYMPH:  no palpable lymphadenopathy in the cervical, axillary or inguinal LUNGS: clear to auscultation and percussion with normal breathing effort HEART: regular rate & rhythm and no murmurs and no lower extremity edema ABDOMEN:abdomen soft, non-tender and normal bowel sounds Musculoskeletal:no cyanosis of digits and no clubbing  NEURO: alert & oriented x 3 with fluent speech, no focal motor/sensory deficits  LABORATORY DATA:  I have reviewed the data as  listed    Component Value Date/Time   NA 139 08/09/2019 0852   NA 139 06/07/2017 1430   K 4.4 08/09/2019 0852   K 3.8 06/07/2017 1430   CL 107 08/09/2019 0852   CO2 23 08/09/2019 0852   CO2 27 06/07/2017 1430   GLUCOSE 101 (H) 08/09/2019 0852   GLUCOSE 124 06/07/2017 1430   BUN 11 08/09/2019 0852   BUN 14.0 06/07/2017 1430   CREATININE 0.74 08/09/2019 0852   CREATININE 0.8 06/07/2017 1430   CALCIUM 8.7 (L) 08/09/2019 0852   CALCIUM 9.3 06/07/2017 1430   PROT 6.3 (L) 08/09/2019 0852   PROT 6.6 09/30/2016 1027   ALBUMIN 3.4 (L) 08/09/2019 0852   ALBUMIN 3.5 09/30/2016 1027   AST 16 08/09/2019 0852   AST 18 09/30/2016 1027   ALT 21 08/09/2019 0852   ALT 21 09/30/2016 1027   ALKPHOS 67 08/09/2019 0852   ALKPHOS 77 09/30/2016 1027   BILITOT 0.4 08/09/2019 0852   BILITOT 0.37 09/30/2016 1027   GFRNONAA >60 08/09/2019 0852   GFRAA >60 08/09/2019 0852    No results found for: SPEP, UPEP  Lab Results  Component Value Date   WBC 6.0 08/30/2019   NEUTROABS 4.1 08/30/2019   HGB 10.5 (L) 08/30/2019   HCT 32.9 (L) 08/30/2019   MCV 93.5 08/30/2019   PLT 342 08/30/2019      Chemistry      Component Value Date/Time   NA 139 08/09/2019 0852   NA 139 06/07/2017 1430   K 4.4 08/09/2019 0852   K 3.8 06/07/2017 1430   CL 107 08/09/2019 0852   CO2 23 08/09/2019 0852   CO2 27 06/07/2017 1430   BUN 11 08/09/2019 0852   BUN 14.0 06/07/2017 1430   CREATININE 0.74 08/09/2019 0852   CREATININE 0.8 06/07/2017 1430      Component Value Date/Time   CALCIUM 8.7 (L) 08/09/2019 0852   CALCIUM 9.3 06/07/2017 1430   ALKPHOS 67 08/09/2019 0852   ALKPHOS 77 09/30/2016 1027   AST 16 08/09/2019 0852   AST 18 09/30/2016 1027   ALT 21 08/09/2019 0852   ALT 21 09/30/2016 1027   BILITOT 0.4 08/09/2019 0852   BILITOT 0.37 09/30/2016 1027       RADIOGRAPHIC STUDIES: I have personally reviewed the radiological images as listed and agreed with the findings in the report. Ct Abdomen  Pelvis W Contrast  Result Date: 08/22/2019 CLINICAL DATA:  Epithelial ovarian/fallopian 2 cancer.  Restaging EXAM: CT ABDOMEN AND PELVIS WITH CONTRAST TECHNIQUE: Multidetector CT imaging of the abdomen and pelvis was performed using the standard protocol following bolus administration of intravenous contrast. CONTRAST:  110m OMNIPAQUE IOHEXOL 300 MG/ML  SOLN COMPARISON:  05/22/2019 FINDINGS: Lower chest: Subsegmental atelectasis identified within the lung bases bilaterally. Mild pleural thickening within the lateral right middle lobe and right base is new from previous exam. No pleural effusion.  Hepatobiliary: No focal liver abnormality is seen. No gallstones, gallbladder wall thickening, or biliary dilatation. Pancreas: Unremarkable. No pancreatic ductal dilatation or surrounding inflammatory changes. Spleen: Normal in size without focal abnormality. Adrenals/Urinary Tract: Normal appearance of the adrenal glands. Bilateral renal sinus cysts. No mass or hydronephrosis. Urinary bladder appears within normal limits. Stomach/Bowel: Stomach is unremarkable. No bowel wall thickening, inflammation or distension identified. Appendix is visualized and appears normal. Periumbilical hernia is identified containing a nonobstructed loop of large bowel. Vascular/Lymphatic: Aortic atherosclerosis. No aneurysm. No abdominopelvic adenopathy identified. Reproductive: Previous hysterectomy.  No adnexal mass. Other: No ascites. Left upper quadrant solid and cystic mass measures 5.0 by 4.4 by 5.7 cm (volume = 66 cm^3). On the previous exam this measured 4.1 x 4.2 by 4.6 cm (volume = 41 cm^3). Musculoskeletal: No acute or significant osseous findings. IMPRESSION: 1. Increase in size of 5.0 cm mass in the gastro splenic ligament consistent with metastatic disease. 2. No additional sites of disease identified within the abdomen or pelvis. 3. Unchanged paraumbilical hernia containing nonobstructed loop of large bowel. 4.  Aortic  Atherosclerosis (ICD10-I70.0). Electronically Signed   By: Kerby Moors M.D.   On: 08/22/2019 10:09    All questions were answered. The patient knows to call the clinic with any problems, questions or concerns. No barriers to learning was detected.  I spent 15 minutes counseling the patient face to face. The total time spent in the appointment was 20 minutes and more than 50% was on counseling and review of test results  [name], MD 08/30/2019 11:54 AM

## 2019-08-30 NOTE — Assessment & Plan Note (Signed)
She has no pain today She will continue to take morphine as needed

## 2019-08-31 LAB — CA 125: Cancer Antigen (CA) 125: 71.5 U/mL — ABNORMAL HIGH (ref 0.0–38.1)

## 2019-09-02 ENCOUNTER — Other Ambulatory Visit: Payer: Medicare Other

## 2019-09-02 ENCOUNTER — Telehealth: Payer: Self-pay | Admitting: *Deleted

## 2019-09-06 ENCOUNTER — Other Ambulatory Visit: Payer: Self-pay

## 2019-09-06 ENCOUNTER — Inpatient Hospital Stay: Payer: Medicare Other

## 2019-09-06 ENCOUNTER — Encounter: Payer: Self-pay | Admitting: Hematology and Oncology

## 2019-09-06 ENCOUNTER — Inpatient Hospital Stay (HOSPITAL_BASED_OUTPATIENT_CLINIC_OR_DEPARTMENT_OTHER): Payer: Medicare Other | Admitting: Hematology and Oncology

## 2019-09-06 DIAGNOSIS — C5702 Malignant neoplasm of left fallopian tube: Secondary | ICD-10-CM

## 2019-09-06 DIAGNOSIS — D649 Anemia, unspecified: Secondary | ICD-10-CM | POA: Diagnosis not present

## 2019-09-06 DIAGNOSIS — D61818 Other pancytopenia: Secondary | ICD-10-CM | POA: Diagnosis not present

## 2019-09-06 DIAGNOSIS — G893 Neoplasm related pain (acute) (chronic): Secondary | ICD-10-CM

## 2019-09-06 DIAGNOSIS — Z7189 Other specified counseling: Secondary | ICD-10-CM

## 2019-09-06 DIAGNOSIS — Z95828 Presence of other vascular implants and grafts: Secondary | ICD-10-CM

## 2019-09-06 DIAGNOSIS — C5700 Malignant neoplasm of unspecified fallopian tube: Secondary | ICD-10-CM

## 2019-09-06 DIAGNOSIS — Z5111 Encounter for antineoplastic chemotherapy: Secondary | ICD-10-CM | POA: Diagnosis not present

## 2019-09-06 LAB — CMP (CANCER CENTER ONLY)
ALT: 39 U/L (ref 0–44)
AST: 21 U/L (ref 15–41)
Albumin: 3.1 g/dL — ABNORMAL LOW (ref 3.5–5.0)
Alkaline Phosphatase: 69 U/L (ref 38–126)
Anion gap: 8 (ref 5–15)
BUN: 11 mg/dL (ref 8–23)
CO2: 25 mmol/L (ref 22–32)
Calcium: 9 mg/dL (ref 8.9–10.3)
Chloride: 105 mmol/L (ref 98–111)
Creatinine: 0.71 mg/dL (ref 0.44–1.00)
GFR, Est AFR Am: >60 mL/min (ref >60)
GFR, Estimated: >60 mL/min (ref >60)
Glucose, Bld: 113 mg/dL — ABNORMAL HIGH (ref 70–99)
Potassium: 4.4 mmol/L (ref 3.5–5.1)
Sodium: 138 mmol/L (ref 135–145)
Total Bilirubin: <0.2 mg/dL — ABNORMAL LOW (ref 0.3–1.2)
Total Protein: 6.7 g/dL (ref 6.5–8.1)

## 2019-09-06 LAB — CBC WITH DIFFERENTIAL (CANCER CENTER ONLY)
Abs Immature Granulocytes: 0.02 10*3/uL (ref 0.00–0.07)
Basophils Absolute: 0 10*3/uL (ref 0.0–0.1)
Basophils Relative: 0 %
Eosinophils Absolute: 0 10*3/uL (ref 0.0–0.5)
Eosinophils Relative: 1 %
HCT: 30 % — ABNORMAL LOW (ref 36.0–46.0)
Hemoglobin: 9.6 g/dL — ABNORMAL LOW (ref 12.0–15.0)
Immature Granulocytes: 1 %
Lymphocytes Relative: 38 %
Lymphs Abs: 0.9 10*3/uL (ref 0.7–4.0)
MCH: 30.3 pg (ref 26.0–34.0)
MCHC: 32 g/dL (ref 30.0–36.0)
MCV: 94.6 fL (ref 80.0–100.0)
Monocytes Absolute: 0.4 10*3/uL (ref 0.1–1.0)
Monocytes Relative: 16 %
Neutro Abs: 1 10*3/uL — ABNORMAL LOW (ref 1.7–7.7)
Neutrophils Relative %: 44 %
Platelet Count: 248 10*3/uL (ref 150–400)
RBC: 3.17 MIL/uL — ABNORMAL LOW (ref 3.87–5.11)
RDW: 14.1 % (ref 11.5–15.5)
WBC Count: 2.4 10*3/uL — ABNORMAL LOW (ref 4.0–10.5)
nRBC: 0 % (ref 0.0–0.2)

## 2019-09-06 MED ORDER — SODIUM CHLORIDE 0.9 % IV SOLN
600.0000 mg/m2 | Freq: Once | INTRAVENOUS | Status: AC
  Administered 2019-09-06: 1026 mg via INTRAVENOUS
  Filled 2019-09-06: qty 26.98

## 2019-09-06 MED ORDER — HEPARIN SOD (PORK) LOCK FLUSH 100 UNIT/ML IV SOLN
500.0000 [IU] | Freq: Once | INTRAVENOUS | Status: AC | PRN
  Administered 2019-09-06: 500 [IU]
  Filled 2019-09-06: qty 5

## 2019-09-06 MED ORDER — ALTEPLASE 2 MG IJ SOLR
INTRAMUSCULAR | Status: AC
  Filled 2019-09-06: qty 2

## 2019-09-06 MED ORDER — SODIUM CHLORIDE 0.9% FLUSH
10.0000 mL | Freq: Once | INTRAVENOUS | Status: AC
  Administered 2019-09-06: 09:00:00 10 mL
  Filled 2019-09-06: qty 10

## 2019-09-06 MED ORDER — PROCHLORPERAZINE MALEATE 10 MG PO TABS
ORAL_TABLET | ORAL | Status: AC
  Filled 2019-09-06: qty 1

## 2019-09-06 MED ORDER — PROCHLORPERAZINE MALEATE 10 MG PO TABS
10.0000 mg | ORAL_TABLET | Freq: Once | ORAL | Status: AC
  Administered 2019-09-06: 10 mg via ORAL

## 2019-09-06 MED ORDER — SODIUM CHLORIDE 0.9% FLUSH
10.0000 mL | INTRAVENOUS | Status: DC | PRN
  Administered 2019-09-06: 10 mL
  Filled 2019-09-06: qty 10

## 2019-09-06 MED ORDER — ALTEPLASE 2 MG IJ SOLR
2.0000 mg | Freq: Once | INTRAMUSCULAR | Status: DC
  Filled 2019-09-06: qty 2

## 2019-09-06 MED ORDER — SODIUM CHLORIDE 0.9 % IV SOLN
Freq: Once | INTRAVENOUS | Status: AC
  Administered 2019-09-06: 10:00:00 via INTRAVENOUS
  Filled 2019-09-06: qty 250

## 2019-09-06 NOTE — Progress Notes (Signed)
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, she tolerated treatment well except for progressive pancytopenia We discussed the risk and benefits of dose adjustment and whether we should take a treatment break Ultimately, she would like to keep her appointment today We will proceed with treatment with dose adjustment If right future blood work next month show persistent pancytopenia, I will adjust her treatment to every other week  Cancer associated pain She has minimum pain She has morphine sulfate to take as needed  Pancytopenia, acquired [facility]) She has significant pancytopenia since chemotherapy I recommend the patient to avoid taking over-the-counter supplement as it may interact with chemotherapy She is not symptomatic from pancytopenia We discussed neutropenic precaution I plan to proceed with treatment but we will adjust the dose of gemcitabine to reduce the risk of complications in the future   No orders of the defined types were placed in this encounter.   INTERVAL HISTORY: Please see below for problem oriented charting. 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 Extensive omental nodularity highly worrisome for peritoneal carcinomatosis. Late recurrence of melanoma can present as peritoneal carcinomatosis. Ovarian cancer more commonly presents in this manner. Patient does have a predominately low density right adnexal lesion measuring up to 3.5 cm, although this lesion is not typical for ovarian cancer. 2. No evidence of bowel or ureteral obstruction. 3. No other definite signs of metastatic disease. There are small indeterminate low-density hepatic lesions.   02/08/2016 Tumor Marker   Patient's tumor was tested for the following markers: CA-125 Results of the tumor marker test revealed 656.2   02/15/2016 Procedure   CT-guided core biopsy performed of omental mass just deep to the abdominal wall.   02/15/2016 Pathology Results   Omentum, biopsy, left - PAPILLARY SEROUS NEOPLASM, SEE COMMENT. Microscopic Comment There are papillary collections of largely low grade appearing cells with numerous psammoma bodies. Given the limited material it is difficult to distinguish between invasive implants of a serous borderline tumor and serous carcinoma, especially given the low grade appearance.   02/18/2016 Pathology Results   1. Omentum, resection for tumor INVASIVE IMPLANT OF HIGH GRADE SEROUS CARCINOMA 2. Uterus +/- tubes/ovaries, neoplastic HIGH GRADE SEROUS CARCINOMA INVOLVING LEFT TUBAL FIMBRIA SEROUS CARCINOMA WITH PREDOMINANT PSAMMOMA BODIES IMPLANT AT UTERUS SEROSA, BILATERAL FALLOPIAN TUBAL SEROSA, AND ANTERIOR PERITONEAL REFLECTION CERVIX: HISTOLOGICAL UNREMARKABLE ENDOMETRIUM: INACTIVE ENDOMETRIUM MYOMETRIUM: LEIOMYOMA LEFT OVARY: CYSTADENOFIBROMA RIGHT OVARY AND FALLOPIAN TUBE: HISTOLOGICAL UNREMARKABLE Microscopic Comment 2. ONCOLOGY TABLE - FALLOPIAN TUBE 1. Specimen, including laterality: Omentum, uterus, bilateral ovaries and  fallopian tubes 2. Procedure: Hysterectomy, bilateral salpingo-oophorectomy and  tumor debulking omentectomy 3. Lymph node sampling performed: No 4. Tumor site: uterus serosa, bilateral fallopian tubal serosa, peritoneal and omentum 5. Tumor location in fallopian tube: Left fallopian tubal fimbria 6. Specimen integrity (intact/ruptured/disrupted): Intact 7. Tumor size (cm): multi focal invasive omentum implant greater than 2 cm, left fallopian tube tumor 0.8 cm. 8. Histologic type: Serous carcinoma 9. Grade: 3 10. Microscopic tumor extension: uterus serosa, bilateral fallopian tubal serosa, peritoneal and omentum 11. Margins: NA 12. Lymph-Vascular invasion: identified 13. Lymph nodes: # examined: 0; # positive: NA 14. TNM: pT3c, pNx 15. FIGO Stage (based on pathologic findings, needs clinical correlation: IIIC 16. Comment: High grade serous carcinoma multifocally and extensively involves left fallopian tubal fimbria, omentum, uterus serosa, bilateral fallopian tubal serosa and peritoneal. At the left fallopian tube there are small foci of serous tubal intraepithelial carcinoma identified, so we conclude the carcinoma is fallopian tube origin   03/09/2016 Tumor Marker   Patient's tumor was tested for the following markers: CA-125 Results of the tumor marker test revealed 154.4   03/15/2016 Procedure   Technically successful right IJ power-injectable port catheter placement. Ready for routine use.   03/18/2016 - 07/13/2016 Chemotherapy   The patient had 6 cycles of carboplatin and taxol   04/14/2016 Tumor Marker   Patient's tumor was tested for the following markers: CA-125 Results of the tumor marker test revealed 36.7   04/25/2016 Genetic Testing   Patient has genetic testing done for germline mutation Results revealed patient has no mutation   04/28/2016 Tumor Marker   Patient's tumor was tested for the following markers: CA-125 Results of the tumor marker test revealed 24.6   06/21/2016  Tumor Marker   Patient's tumor was tested for the following markers: CA-125 Results of the tumor marker test revealed 16.6   08/01/2016 Tumor Marker   Patient's tumor was tested for the following markers: CA-125 Results of the tumor marker test revealed 17.2   08/05/2016 Imaging   Interval TAH-BSO. No evidence of residual pelvic mass or metastatic disease within the abdomen or pelvis. No other acute findings.    11/04/2016 Tumor Marker   Patient's tumor was tested for the following markers: CA-125 Results of the tumor marker test revealed 13.6   01/20/2017 Tumor Marker   Patient's tumor was tested for the following markers: CA-125 Results of the tumor marker test revealed 13.8   04/14/2017 Tumor Marker   Patient's tumor was tested for the following markers: CA-125 Results of the tumor marker test revealed 16.1   06/13/2017 Imaging   No acute findings.  No mass or hernia identified.  Colonic diverticulosis, without radiographic evidence of diverticulitis.  Aortic atherosclerosis.   07/03/2017 Tumor Marker   Patient's tumor was tested for the following markers: CA-125 Results of the tumor marker test revealed 18.4   09/19/2017 Tumor Marker   Patient's tumor was tested for the following markers: CA-125 Results of the tumor marker test revealed 18.5   01/23/2018 Tumor Marker   Patient's tumor was tested for the following markers: CA-125 Results of the tumor marker test revealed 35.4   01/30/2018 Imaging   1. No evidence of local fallopian tube carcinoma recurrence within the pelvis. Post hysterectomy. 2. No evidence of metastatic peritoneal disease or omental disease. No solid organ metastasis.    03/20/2018 Tumor Marker   Patient's tumor was tested for the following markers: CA-125 Results of the tumor marker test revealed 73   05/02/2018 Tumor Marker   Patient's tumor was tested for the  following markers: CA-125 Results of the tumor marker test revealed 127.1   05/08/2018  Imaging   CT abdomen and pelvis 1. 7 cm left subdiaphragmatic collection of loculated fluid versus low-density soft tissue. Given the patient's history of rising CA 125, metastatic disease considered highly likely.  2. Areas of fluid or recurrent disease identified in the right lower quadrant adjacent to the cecum and along right lower quadrant small bowel loops.   05/14/2018 Tumor Marker   Patient's tumor was tested for the following markers: CA-125 Results of the tumor marker test revealed 166    Genetic Testing   Patient has genetic testing done for ER/PR and MMR. Results revealed patient has the following on 02/18/2016 surgical pathology: ER 20%, PR 0% MMR: normal    Genetic Testing   Patient has genetic testing done for MSI. Results revealed patient has the following: MSI stable   05/18/2018 - 12/31/2018 Chemotherapy   The patient had carboplatin and Doxil   07/16/2018 Tumor Marker   Patient's tumor was tested for the following markers: CA-125 Results of the tumor marker test revealed 28.7   08/10/2018 Imaging   CT imaging:  Decreased size of peritoneal soft tissue masses in left upper quadrant, consistent with decreased metastatic disease.  No new or progressive metastatic disease. No other acute findings.  Colonic diverticulosis, without radiographic evidence of diverticulitis.  Stable small paraumbilical ventral hernia containing transverse colon.   08/13/2018 Tumor Marker   Patient's tumor was tested for the following markers: CA-125 Results of the tumor marker test revealed 21.6   08/24/2018 Echocardiogram   LV EF: 60% -  65%   10/15/2018 Tumor Marker   Patient's tumor was tested for the following markers: CA-125 Results of the tumor marker test revealed 20.1   10/19/2018 Imaging   Ct scan of abdomen and pelvis Status post hysterectomy, bilateral salpingo-oophorectomy, and omentectomy.  Two peritoneal implants in the left upper abdomen are stable versus  mildly decreased, as above. No abdominopelvic ascites.  No evidence of new/progressive metastatic disease.     11/19/2018 Tumor Marker   Patient's tumor was tested for the following markers: CA-125 Results of the tumor marker test revealed 21.7   11/23/2018 Echocardiogram   1. The left ventricle has normal systolic function of 95-63%. The cavity size was normal. There is no increased left ventricular wall thickness. Echo evidence of impaired diastolic relaxation.  2. The right ventricle has normal systolic function. The cavity was normal. There is no increase in right ventricular wall thickness.  3. The mitral valve is normal in structure. There is mild mitral annular calcification present.  4. The tricuspid valve is normal in structure.  5. The aortic valve is tricuspid There is mild thickening of the aortic valve.  6. The pulmonic valve was normal in structure. Pulmonic valve regurgitation is mild by color flow Doppler.  7. Normal LV systolic function; mild diastolic dysfunction.   12/31/2018 Tumor Marker   Patient's tumor was tested for the following markers: CA-125 Results of the tumor marker test revealed 17.1   01/31/2019 Imaging   1. Left upper quadrant peritoneal implants described previously have decreased in the interval. No new or progressive findings in the abdomen/pelvis today. No free fluid. 2. Right paraumbilical ventral hernia contains a small knuckle of transverse colon without complicating features. 3.  Aortic Atherosclerois (ICD10-170.0)  Aortic Atherosclerosis (ICD10-I70.0).   01/31/2019 Tumor Marker   Patient's tumor was tested for the following markers: CA-125 Results of the tumor marker test  revealed 18.9   02/11/2019 - 03/13/2019 Chemotherapy   The patient is taking Niraparib. She self discontinued after 1 month   02/25/2019 Tumor Marker   Patient's tumor was tested for the following markers: CA-125 Results of the tumor marker test revealed 18.6   05/13/2019  Tumor Marker   Patient's tumor was tested for the following markers: CA-125 Results of the tumor marker test revealed 230   05/22/2019 Imaging   1. Increased size of 4.6 cm soft tissue mass in the gastrosplenic ligament, consistent with metastatic disease. 2. No other sites of metastatic disease identified within the abdomen or pelvis. 3. Colonic diverticulosis. No radiographic evidence of diverticulitis. 4. Stable small right paraumbilical hernia containing transverse colon.   Aortic Atherosclerosis (ICD10-I70.0).   05/31/2019 - 08/22/2019 Chemotherapy   The patient had bevacizumab and Taxol for chemotherapy treatment.     05/31/2019 Tumor Marker   Patient's tumor was tested for the following markers: CA-125 Results of the tumor marker test revealed 84   06/21/2019 Tumor Marker   Patient's tumor was tested for the following markers: CA-125 Results of the tumor marker test revealed 49.4.   07/19/2019 Tumor Marker   Patient's tumor was tested for the following markers: CA-125. Results of the tumor marker test revealed 44.5   08/22/2019 Imaging   1. Increase in size of 5.0 cm mass in the gastro splenic ligament consistent with metastatic disease. 2. No additional sites of disease identified within the abdomen or pelvis. 3. Unchanged paraumbilical hernia containing nonobstructed loop of large bowel. 4.  Aortic Atherosclerosis (ICD10-I70.0).     08/30/2019 Tumor Marker   Patient's tumor was tested for the following markers: CA-125 Results of the tumor marker test revealed 71.5     REVIEW OF SYSTEMS:   Constitutional: Denies fevers, chills or abnormal weight loss Eyes: Denies blurriness of vision Ears, nose, mouth, throat, and face: Denies mucositis or sore throat Respiratory: Denies cough, dyspnea or wheezes Cardiovascular: Denies palpitation, chest discomfort or lower extremity swelling Gastrointestinal:  Denies nausea, heartburn or change in bowel habits Skin: Denies abnormal  skin rashes Lymphatics: Denies new lymphadenopathy or easy bruising Neurological:Denies numbness, tingling or new weaknesses Behavioral/Psych: Mood is stable, no new changes  All other systems were reviewed with the patient and are negative.  I have reviewed the past medical history, past surgical history, social history and family history with the patient and they are unchanged from previous note.  ALLERGIES:  has No Known Allergies.  MEDICATIONS:  Current Outpatient Medications  Medication Sig Dispense Refill  . Cholecalciferol (VITAMIN D) 2000 units CAPS Take 1 capsule by mouth daily.    Marland Kitchen ezetimibe (ZETIA) 10 MG tablet Take 10 mg by mouth daily.    Marland Kitchen lidocaine-prilocaine (EMLA) cream Apply to affected area once 30 g 3  . morphine (MSIR) 15 MG tablet Take 1 tablet (15 mg total) by mouth every 4 (four) hours as needed for severe pain. 30 tablet 0  . ondansetron (ZOFRAN) 8 MG tablet Take 1 tablet (8 mg total) by mouth 2 (two) times daily as needed (Nausea or vomiting). 30 tablet 1  . prochlorperazine (COMPAZINE) 10 MG tablet Take 1 tablet (10 mg total) by mouth every 6 (six) hours as needed (Nausea or vomiting). 30 tablet 1   No current facility-administered medications for this visit.     PHYSICAL EXAMINATION: ECOG PERFORMANCE STATUS: 1 - Symptomatic but completely ambulatory  Vitals:   09/06/19 0928  BP: 126/71  Pulse: 75  Resp: 18  Temp: 98 F (36.7 C)  SpO2: 100%   Filed Weights   09/06/19 0928  Weight: 143 lb 11.2 oz (65.2 kg)    GENERAL:alert, no distress and comfortable SKIN: skin color, texture, turgor are normal, no rashes or significant lesions EYES: normal, Conjunctiva are pink and non-injected, sclera clear OROPHARYNX:no exudate, no erythema and lips, buccal mucosa, and tongue normal  NECK: supple, thyroid normal size, non-tender, without nodularity LYMPH:  no palpable lymphadenopathy in the cervical, axillary or inguinal LUNGS: clear to auscultation and  percussion with normal breathing effort HEART: regular rate & rhythm and no murmurs and no lower extremity edema ABDOMEN:abdomen soft, non-tender and normal bowel sounds Musculoskeletal:no cyanosis of digits and no clubbing  NEURO: alert & oriented x 3 with fluent speech, no focal motor/sensory deficits  LABORATORY DATA:  I have reviewed the data as listed    Component Value Date/Time   NA 138 08/30/2019 1130   NA 139 06/07/2017 1430   K 4.7 08/30/2019 1130   K 3.8 06/07/2017 1430   CL 105 08/30/2019 1130   CO2 22 08/30/2019 1130   CO2 27 06/07/2017 1430   GLUCOSE 96 08/30/2019 1130   GLUCOSE 124 06/07/2017 1430   BUN 12 08/30/2019 1130   BUN 14.0 06/07/2017 1430   CREATININE 0.71 08/30/2019 1130   CREATININE 0.8 06/07/2017 1430   CALCIUM 8.8 (L) 08/30/2019 1130   CALCIUM 9.3 06/07/2017 1430   PROT 6.9 08/30/2019 1130   PROT 6.6 09/30/2016 1027   ALBUMIN 3.1 (L) 08/30/2019 1130   ALBUMIN 3.5 09/30/2016 1027   AST 13 (L) 08/30/2019 1130   AST 18 09/30/2016 1027   ALT 17 08/30/2019 1130   ALT 21 09/30/2016 1027   ALKPHOS 82 08/30/2019 1130   ALKPHOS 77 09/30/2016 1027   BILITOT <0.2 (L) 08/30/2019 1130   BILITOT 0.37 09/30/2016 1027   GFRNONAA >60 08/30/2019 1130   GFRAA >60 08/30/2019 1130    No results found for: SPEP, UPEP  Lab Results  Component Value Date   WBC 2.4 (L) 09/06/2019   NEUTROABS 1.0 (L) 09/06/2019   HGB 9.6 (L) 09/06/2019   HCT 30.0 (L) 09/06/2019   MCV 94.6 09/06/2019   PLT 248 09/06/2019      Chemistry      Component Value Date/Time   NA 138 08/30/2019 1130   NA 139 06/07/2017 1430   K 4.7 08/30/2019 1130   K 3.8 06/07/2017 1430   CL 105 08/30/2019 1130   CO2 22 08/30/2019 1130   CO2 27 06/07/2017 1430   BUN 12 08/30/2019 1130   BUN 14.0 06/07/2017 1430   CREATININE 0.71 08/30/2019 1130   CREATININE 0.8 06/07/2017 1430      Component Value Date/Time   CALCIUM 8.8 (L) 08/30/2019 1130   CALCIUM 9.3 06/07/2017 1430   ALKPHOS 82  08/30/2019 1130   ALKPHOS 77 09/30/2016 1027   AST 13 (L) 08/30/2019 1130   AST 18 09/30/2016 1027   ALT 17 08/30/2019 1130   ALT 21 09/30/2016 1027   BILITOT <0.2 (L) 08/30/2019 1130   BILITOT 0.37 09/30/2016 1027       RADIOGRAPHIC STUDIES: I have personally reviewed the radiological images as listed and agreed with the findings in the report. Ct Abdomen Pelvis W Contrast  Result Date: 08/22/2019 CLINICAL DATA:  Epithelial ovarian/fallopian 2 cancer.  Restaging EXAM: CT ABDOMEN AND PELVIS WITH CONTRAST TECHNIQUE: Multidetector CT imaging of the abdomen and pelvis was performed using the standard protocol following bolus  administration of intravenous contrast. CONTRAST:  139m OMNIPAQUE IOHEXOL 300 MG/ML  SOLN COMPARISON:  05/22/2019 FINDINGS: Lower chest: Subsegmental atelectasis identified within the lung bases bilaterally. Mild pleural thickening within the lateral right middle lobe and right base is new from previous exam. No pleural effusion. Hepatobiliary: No focal liver abnormality is seen. No gallstones, gallbladder wall thickening, or biliary dilatation. Pancreas: Unremarkable. No pancreatic ductal dilatation or surrounding inflammatory changes. Spleen: Normal in size without focal abnormality. Adrenals/Urinary Tract: Normal appearance of the adrenal glands. Bilateral renal sinus cysts. No mass or hydronephrosis. Urinary bladder appears within normal limits. Stomach/Bowel: Stomach is unremarkable. No bowel wall thickening, inflammation or distension identified. Appendix is visualized and appears normal. Periumbilical hernia is identified containing a nonobstructed loop of large bowel. Vascular/Lymphatic: Aortic atherosclerosis. No aneurysm. No abdominopelvic adenopathy identified. Reproductive: Previous hysterectomy.  No adnexal mass. Other: No ascites. Left upper quadrant solid and cystic mass measures 5.0 by 4.4 by 5.7 cm (volume = 66 cm^3). On the previous exam this measured 4.1 x 4.2 by  4.6 cm (volume = 41 cm^3). Musculoskeletal: No acute or significant osseous findings. IMPRESSION: 1. Increase in size of 5.0 cm mass in the gastro splenic ligament consistent with metastatic disease. 2. No additional sites of disease identified within the abdomen or pelvis. 3. Unchanged paraumbilical hernia containing nonobstructed loop of large bowel. 4.  Aortic Atherosclerosis (ICD10-I70.0). Electronically Signed   By: TKerby MoorsM.D.   On: 08/22/2019 10:09    All questions were answered. The patient knows to call the clinic with any problems, questions or concerns. No barriers to learning was detected.  I spent 25 minutes counseling the patient face to face. The total time spent in the appointment was 30 minutes and more than 50% was on counseling and review of test results  NHeath Lark MD 09/06/2019 9:40 AM

## 2019-09-06 NOTE — Assessment & Plan Note (Signed)
Clinically, she tolerated treatment well except for progressive pancytopenia We discussed the risk and benefits of dose adjustment and whether we should take a treatment break Ultimately, she would like to keep her appointment today We will proceed with treatment with dose adjustment If right future blood work next month show persistent pancytopenia, I will adjust her treatment to every other week

## 2019-09-06 NOTE — Assessment & Plan Note (Signed)
She has minimum pain She has morphine sulfate to take as needed

## 2019-09-06 NOTE — Assessment & Plan Note (Signed)
She has significant pancytopenia since chemotherapy I recommend the patient to avoid taking over-the-counter supplement as it may interact with chemotherapy She is not symptomatic from pancytopenia We discussed neutropenic precaution I plan to proceed with treatment but we will adjust the dose of gemcitabine to reduce the risk of complications in the future

## 2019-09-06 NOTE — Patient Instructions (Signed)
[facility] Discharge Instructions for Patients Receiving Chemotherapy  Today you received the following chemotherapy agents gemzar  To help prevent nausea and vomiting after your treatment, we encourage you to take your nausea medication as directed.  If you develop nausea and vomiting that is not controlled by your nausea medication, call the clinic.   BELOW ARE SYMPTOMS THAT SHOULD BE REPORTED IMMEDIATELY:  *FEVER GREATER THAN 100.5 F  *CHILLS WITH OR WITHOUT FEVER  NAUSEA AND VOMITING THAT IS NOT CONTROLLED WITH YOUR NAUSEA MEDICATION  *UNUSUAL SHORTNESS OF BREATH  *UNUSUAL BRUISING OR BLEEDING  TENDERNESS IN MOUTH AND THROAT WITH OR WITHOUT PRESENCE OF ULCERS  *URINARY PROBLEMS  *BOWEL PROBLEMS  UNUSUAL RASH Items with * indicate a potential emergency and should be followed up as soon as possible.  Feel free to call the clinic you have any questions or concerns. The clinic phone number is [phone].  

## 2019-09-06 NOTE — Patient Instructions (Signed)

## 2019-09-06 NOTE — Progress Notes (Signed)
Ok to proceed with D8C1 Gemzar with ANC count of 1.0 per Dr. Alvy Bimler.

## 2019-09-09 ENCOUNTER — Telehealth: Payer: Self-pay | Admitting: Oncology

## 2019-09-09 NOTE — Telephone Encounter (Signed)
Left a message for that CBD oil/cream is ok to use.

## 2019-09-09 NOTE — Telephone Encounter (Signed)
ok 

## 2019-09-09 NOTE — Telephone Encounter (Signed)
Jessica Johnston left a message asking it is ok for her to use CBD oil/cream on her left upper leg.

## 2019-09-20 ENCOUNTER — Inpatient Hospital Stay: Payer: Medicare Other

## 2019-09-20 ENCOUNTER — Inpatient Hospital Stay: Payer: Medicare Other | Attending: Gynecologic Oncology

## 2019-09-20 ENCOUNTER — Other Ambulatory Visit: Payer: Self-pay

## 2019-09-20 VITALS — BP 123/65 | HR 74 | Temp 98.5°F | Resp 18

## 2019-09-20 DIAGNOSIS — Z452 Encounter for adjustment and management of vascular access device: Secondary | ICD-10-CM | POA: Diagnosis not present

## 2019-09-20 DIAGNOSIS — C5702 Malignant neoplasm of left fallopian tube: Secondary | ICD-10-CM | POA: Insufficient documentation

## 2019-09-20 DIAGNOSIS — Z5111 Encounter for antineoplastic chemotherapy: Secondary | ICD-10-CM | POA: Insufficient documentation

## 2019-09-20 DIAGNOSIS — Z7189 Other specified counseling: Secondary | ICD-10-CM

## 2019-09-20 DIAGNOSIS — Z95828 Presence of other vascular implants and grafts: Secondary | ICD-10-CM

## 2019-09-20 LAB — CBC WITH DIFFERENTIAL (CANCER CENTER ONLY)
Abs Immature Granulocytes: 0.02 10*3/uL (ref 0.00–0.07)
Basophils Absolute: 0 10*3/uL (ref 0.0–0.1)
Basophils Relative: 1 %
Eosinophils Absolute: 0.1 10*3/uL (ref 0.0–0.5)
Eosinophils Relative: 2 %
HCT: 34 % — ABNORMAL LOW (ref 36.0–46.0)
Hemoglobin: 11 g/dL — ABNORMAL LOW (ref 12.0–15.0)
Immature Granulocytes: 1 %
Lymphocytes Relative: 26 %
Lymphs Abs: 1.2 10*3/uL (ref 0.7–4.0)
MCH: 30.7 pg (ref 26.0–34.0)
MCHC: 32.4 g/dL (ref 30.0–36.0)
MCV: 95 fL (ref 80.0–100.0)
Monocytes Absolute: 0.8 10*3/uL (ref 0.1–1.0)
Monocytes Relative: 18 %
Neutro Abs: 2.3 10*3/uL (ref 1.7–7.7)
Neutrophils Relative %: 52 %
Platelet Count: 409 10*3/uL — ABNORMAL HIGH (ref 150–400)
RBC: 3.58 MIL/uL — ABNORMAL LOW (ref 3.87–5.11)
RDW: 15.8 % — ABNORMAL HIGH (ref 11.5–15.5)
WBC Count: 4.4 10*3/uL (ref 4.0–10.5)
nRBC: 0 % (ref 0.0–0.2)

## 2019-09-20 LAB — CMP (CANCER CENTER ONLY)
ALT: 27 U/L (ref 0–44)
AST: 19 U/L (ref 15–41)
Albumin: 3.6 g/dL (ref 3.5–5.0)
Alkaline Phosphatase: 78 U/L (ref 38–126)
Anion gap: 8 (ref 5–15)
BUN: 17 mg/dL (ref 8–23)
CO2: 27 mmol/L (ref 22–32)
Calcium: 9.1 mg/dL (ref 8.9–10.3)
Chloride: 102 mmol/L (ref 98–111)
Creatinine: 0.83 mg/dL (ref 0.44–1.00)
GFR, Est AFR Am: >60 mL/min (ref >60)
GFR, Estimated: >60 mL/min (ref >60)
Glucose, Bld: 93 mg/dL (ref 70–99)
Potassium: 4.5 mmol/L (ref 3.5–5.1)
Sodium: 137 mmol/L (ref 135–145)
Total Bilirubin: <0.2 mg/dL — ABNORMAL LOW (ref 0.3–1.2)
Total Protein: 7.1 g/dL (ref 6.5–8.1)

## 2019-09-20 MED ORDER — SODIUM CHLORIDE 0.9 % IV SOLN
Freq: Once | INTRAVENOUS | Status: AC
  Administered 2019-09-20: 14:00:00 via INTRAVENOUS
  Filled 2019-09-20: qty 250

## 2019-09-20 MED ORDER — PROCHLORPERAZINE MALEATE 10 MG PO TABS
10.0000 mg | ORAL_TABLET | Freq: Once | ORAL | Status: AC
  Administered 2019-09-20: 10 mg via ORAL

## 2019-09-20 MED ORDER — PROCHLORPERAZINE MALEATE 10 MG PO TABS
ORAL_TABLET | ORAL | Status: AC
  Filled 2019-09-20: qty 1

## 2019-09-20 MED ORDER — SODIUM CHLORIDE 0.9 % IV SOLN
600.0000 mg/m2 | Freq: Once | INTRAVENOUS | Status: AC
  Administered 2019-09-20: 1026 mg via INTRAVENOUS
  Filled 2019-09-20: qty 26.98

## 2019-09-20 MED ORDER — SODIUM CHLORIDE 0.9% FLUSH
10.0000 mL | INTRAVENOUS | Status: DC | PRN
  Administered 2019-09-20: 10 mL
  Filled 2019-09-20: qty 10

## 2019-09-20 MED ORDER — SODIUM CHLORIDE 0.9% FLUSH
10.0000 mL | Freq: Once | INTRAVENOUS | Status: AC
  Administered 2019-09-20: 10 mL
  Filled 2019-09-20: qty 10

## 2019-09-20 MED ORDER — ALTEPLASE 2 MG IJ SOLR
2.0000 mg | Freq: Once | INTRAMUSCULAR | Status: AC
  Administered 2019-09-20: 2 mg
  Filled 2019-09-20: qty 2

## 2019-09-20 MED ORDER — HEPARIN SOD (PORK) LOCK FLUSH 100 UNIT/ML IV SOLN
500.0000 [IU] | Freq: Once | INTRAVENOUS | Status: AC | PRN
  Administered 2019-09-20: 500 [IU]
  Filled 2019-09-20: qty 5

## 2019-09-20 NOTE — Patient Instructions (Signed)
[facility] Discharge Instructions for Patients Receiving Chemotherapy  Today you received the following chemotherapy agents gemzar   To help prevent nausea and vomiting after your treatment, we encourage you to take your nausea medication as directed   If you develop nausea and vomiting that is not controlled by your nausea medication, call the clinic.   BELOW ARE SYMPTOMS THAT SHOULD BE REPORTED IMMEDIATELY:  *FEVER GREATER THAN 100.5 F  *CHILLS WITH OR WITHOUT FEVER  NAUSEA AND VOMITING THAT IS NOT CONTROLLED WITH YOUR NAUSEA MEDICATION  *UNUSUAL SHORTNESS OF BREATH  *UNUSUAL BRUISING OR BLEEDING  TENDERNESS IN MOUTH AND THROAT WITH OR WITHOUT PRESENCE OF ULCERS  *URINARY PROBLEMS  *BOWEL PROBLEMS  UNUSUAL RASH Items with * indicate a potential emergency and should be followed up as soon as possible.  Feel free to call the clinic you have any questions or concerns. The clinic phone number is [phone].  Gemcitabine injection What is this medicine? GEMCITABINE (jem SYE ta been) is a chemotherapy drug. This medicine is used to treat many types of cancer like breast cancer, lung cancer, pancreatic cancer, and ovarian cancer. This medicine may be used for other purposes; ask your health care provider or pharmacist if you have questions. COMMON BRAND NAME(S): Gemzar, Infugem What should I tell my health care provider before I take this medicine? They need to know if you have any of these conditions:  blood disorders  infection  kidney disease  liver disease  lung or breathing disease, like asthma  recent or ongoing radiation therapy  an unusual or allergic reaction to gemcitabine, other chemotherapy, other medicines, foods, dyes, or preservatives  pregnant or trying to get pregnant  breast-feeding How should I use this medicine? This drug is given as an infusion into a vein. It is administered in a hospital or clinic by a specially trained  health care professional. Talk to your pediatrician regarding the use of this medicine in children. Special care may be needed. Overdosage: If you think you have taken too much of this medicine contact a poison control center or emergency room at once. NOTE: This medicine is only for you. Do not share this medicine with others. What if I miss a dose? It is important not to miss your dose. Call your doctor or health care professional if you are unable to keep an appointment. What may interact with this medicine?  medicines to increase blood counts like filgrastim, pegfilgrastim, sargramostim  some other chemotherapy drugs like cisplatin  vaccines Talk to your doctor or health care professional before taking any of these medicines:  acetaminophen  aspirin  ibuprofen  ketoprofen  naproxen This list may not describe all possible interactions. Give your health care provider a list of all the medicines, herbs, non-prescription drugs, or dietary supplements you use. Also tell them if you smoke, drink alcohol, or use illegal drugs. Some items may interact with your medicine. What should I watch for while using this medicine? Visit your doctor for checks on your progress. This drug may make you feel generally unwell. This is not uncommon, as chemotherapy can affect healthy cells as well as cancer cells. Report any side effects. Continue your course of treatment even though you feel ill unless your doctor tells you to stop. In some cases, you may be given additional medicines to help with side effects. Follow all directions for their use. Call your doctor or health care professional for advice if you get a fever, chills or sore  throat, or other symptoms of a cold or flu. Do not treat yourself. This drug decreases your body's ability to fight infections. Try to avoid being around people who are sick. This medicine may increase your risk to bruise or bleed. Call your doctor or health care  professional if you notice any unusual bleeding. Be careful brushing and flossing your teeth or using a toothpick because you may get an infection or bleed more easily. If you have any dental work done, tell your dentist you are receiving this medicine. Avoid taking products that contain aspirin, acetaminophen, ibuprofen, naproxen, or ketoprofen unless instructed by your doctor. These medicines may hide a fever. Do not become pregnant while taking this medicine or for 6 months after stopping it. Women should inform their doctor if they wish to become pregnant or think they might be pregnant. Men should not father a child while taking this medicine and for 3 months after stopping it. There is a potential for serious side effects to an unborn child. Talk to your health care professional or pharmacist for more information. Do not breast-feed an infant while taking this medicine or for at least 1 week after stopping it. Men should inform their doctors if they wish to father a child. This medicine may lower sperm counts. Talk with your doctor or health care professional if you are concerned about your fertility. What side effects may I notice from receiving this medicine? Side effects that you should report to your doctor or health care professional as soon as possible:  allergic reactions like skin rash, itching or hives, swelling of the face, lips, or tongue  breathing problems  pain, redness, or irritation at site where injected  signs and symptoms of a dangerous change in heartbeat or heart rhythm like chest pain; dizziness; fast or irregular heartbeat; palpitations; feeling faint or lightheaded, falls; breathing problems  signs of decreased platelets or bleeding - bruising, pinpoint red spots on the skin, black, tarry stools, blood in the urine  signs of decreased red blood cells - unusually weak or tired, feeling faint or lightheaded, falls  signs of infection - fever or chills, cough, sore  throat, pain or difficulty passing urine  signs and symptoms of kidney injury like trouble passing urine or change in the amount of urine  signs and symptoms of liver injury like dark yellow or brown urine; general ill feeling or flu-like symptoms; light-colored stools; loss of appetite; nausea; right upper belly pain; unusually weak or tired; yellowing of the eyes or skin  swelling of ankles, feet, hands Side effects that usually do not require medical attention (report to your doctor or health care professional if they continue or are bothersome):  constipation  diarrhea  hair loss  loss of appetite  nausea  rash  vomiting This list may not describe all possible side effects. Call your doctor for medical advice about side effects. You may report side effects to FDA at 1-800-FDA-1088. Where should I keep my medicine? This drug is given in a hospital or clinic and will not be stored at home. NOTE: This sheet is a summary. It may not cover all possible information. If you have questions about this medicine, talk to your doctor, pharmacist, or health care provider.  2020 [employer] (2017-12-27 18:06:11)

## 2019-09-20 NOTE — Progress Notes (Signed)
Unable to get blood return from port. Cathflo administered by [employer]. Patient sent back to lab to get labs drawn from arm

## 2019-09-27 ENCOUNTER — Other Ambulatory Visit: Payer: Self-pay

## 2019-09-27 ENCOUNTER — Inpatient Hospital Stay: Payer: Medicare Other

## 2019-09-27 ENCOUNTER — Inpatient Hospital Stay (HOSPITAL_BASED_OUTPATIENT_CLINIC_OR_DEPARTMENT_OTHER): Payer: Medicare Other | Admitting: Hematology and Oncology

## 2019-09-27 ENCOUNTER — Encounter (INDEPENDENT_AMBULATORY_CARE_PROVIDER_SITE_OTHER): Payer: Self-pay

## 2019-09-27 ENCOUNTER — Encounter: Payer: Self-pay | Admitting: Hematology and Oncology

## 2019-09-27 ENCOUNTER — Other Ambulatory Visit: Payer: Self-pay | Admitting: Hematology and Oncology

## 2019-09-27 VITALS — BP 118/68 | HR 68 | Temp 98.9°F | Resp 18 | Wt 142.4 lb

## 2019-09-27 DIAGNOSIS — Z95828 Presence of other vascular implants and grafts: Secondary | ICD-10-CM

## 2019-09-27 DIAGNOSIS — C5700 Malignant neoplasm of unspecified fallopian tube: Secondary | ICD-10-CM | POA: Diagnosis not present

## 2019-09-27 DIAGNOSIS — G893 Neoplasm related pain (acute) (chronic): Secondary | ICD-10-CM

## 2019-09-27 DIAGNOSIS — C5702 Malignant neoplasm of left fallopian tube: Secondary | ICD-10-CM

## 2019-09-27 DIAGNOSIS — Z7189 Other specified counseling: Secondary | ICD-10-CM

## 2019-09-27 DIAGNOSIS — D61818 Other pancytopenia: Secondary | ICD-10-CM | POA: Diagnosis not present

## 2019-09-27 DIAGNOSIS — Z5111 Encounter for antineoplastic chemotherapy: Secondary | ICD-10-CM | POA: Diagnosis not present

## 2019-09-27 LAB — CBC WITH DIFFERENTIAL (CANCER CENTER ONLY)
Abs Immature Granulocytes: 0.01 10*3/uL (ref 0.00–0.07)
Basophils Absolute: 0 10*3/uL (ref 0.0–0.1)
Basophils Relative: 1 %
Eosinophils Absolute: 0 10*3/uL (ref 0.0–0.5)
Eosinophils Relative: 0 %
HCT: 30.4 % — ABNORMAL LOW (ref 36.0–46.0)
Hemoglobin: 9.8 g/dL — ABNORMAL LOW (ref 12.0–15.0)
Immature Granulocytes: 0 %
Lymphocytes Relative: 38 %
Lymphs Abs: 0.9 10*3/uL (ref 0.7–4.0)
MCH: 30.4 pg (ref 26.0–34.0)
MCHC: 32.2 g/dL (ref 30.0–36.0)
MCV: 94.4 fL (ref 80.0–100.0)
Monocytes Absolute: 0.5 10*3/uL (ref 0.1–1.0)
Monocytes Relative: 20 %
Neutro Abs: 1 10*3/uL — ABNORMAL LOW (ref 1.7–7.7)
Neutrophils Relative %: 41 %
Platelet Count: 345 10*3/uL (ref 150–400)
RBC: 3.22 MIL/uL — ABNORMAL LOW (ref 3.87–5.11)
RDW: 15.2 % (ref 11.5–15.5)
WBC Count: 2.4 10*3/uL — ABNORMAL LOW (ref 4.0–10.5)
nRBC: 0 % (ref 0.0–0.2)

## 2019-09-27 LAB — CMP (CANCER CENTER ONLY)
ALT: 36 U/L (ref 0–44)
AST: 28 U/L (ref 15–41)
Albumin: 3.3 g/dL — ABNORMAL LOW (ref 3.5–5.0)
Alkaline Phosphatase: 74 U/L (ref 38–126)
Anion gap: 9 (ref 5–15)
BUN: 11 mg/dL (ref 8–23)
CO2: 26 mmol/L (ref 22–32)
Calcium: 9.1 mg/dL (ref 8.9–10.3)
Chloride: 103 mmol/L (ref 98–111)
Creatinine: 0.77 mg/dL (ref 0.44–1.00)
GFR, Est AFR Am: >60 mL/min (ref >60)
GFR, Estimated: >60 mL/min (ref >60)
Glucose, Bld: 124 mg/dL — ABNORMAL HIGH (ref 70–99)
Potassium: 4.5 mmol/L (ref 3.5–5.1)
Sodium: 138 mmol/L (ref 135–145)
Total Bilirubin: 0.2 mg/dL — ABNORMAL LOW (ref 0.3–1.2)
Total Protein: 6.9 g/dL (ref 6.5–8.1)

## 2019-09-27 MED ORDER — SODIUM CHLORIDE 0.9 % IV SOLN
600.0000 mg/m2 | Freq: Once | INTRAVENOUS | Status: AC
  Administered 2019-09-27: 1026 mg via INTRAVENOUS
  Filled 2019-09-27: qty 26.98

## 2019-09-27 MED ORDER — SODIUM CHLORIDE 0.9% FLUSH
10.0000 mL | INTRAVENOUS | Status: DC | PRN
  Administered 2019-09-27: 10 mL
  Filled 2019-09-27: qty 10

## 2019-09-27 MED ORDER — HEPARIN SOD (PORK) LOCK FLUSH 100 UNIT/ML IV SOLN
500.0000 [IU] | Freq: Once | INTRAVENOUS | Status: AC | PRN
  Administered 2019-09-27: 500 [IU]
  Filled 2019-09-27: qty 5

## 2019-09-27 MED ORDER — PROCHLORPERAZINE MALEATE 10 MG PO TABS
10.0000 mg | ORAL_TABLET | Freq: Once | ORAL | Status: AC
  Administered 2019-09-27: 10 mg via ORAL

## 2019-09-27 MED ORDER — PROCHLORPERAZINE MALEATE 10 MG PO TABS
ORAL_TABLET | ORAL | Status: AC
  Filled 2019-09-27: qty 1

## 2019-09-27 MED ORDER — SODIUM CHLORIDE 0.9 % IV SOLN
Freq: Once | INTRAVENOUS | Status: AC
  Administered 2019-09-27: 11:00:00 via INTRAVENOUS
  Filled 2019-09-27: qty 250

## 2019-09-27 MED ORDER — SODIUM CHLORIDE 0.9% FLUSH
10.0000 mL | Freq: Once | INTRAVENOUS | Status: AC
  Administered 2019-09-27: 10 mL
  Filled 2019-09-27: qty 10

## 2019-09-27 NOTE — Patient Instructions (Signed)

## 2019-09-27 NOTE — Progress Notes (Signed)
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   INTERVAL HISTORY: Please see below for problem oriented charting. 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 Extensive omental nodularity highly worrisome for peritoneal carcinomatosis. Late recurrence of melanoma can present as peritoneal carcinomatosis. Ovarian cancer more commonly presents in this manner. Patient does have a predominately low density right adnexal lesion measuring up to 3.5 cm, although this lesion is not typical for ovarian cancer. 2. No evidence of bowel or ureteral obstruction. 3. No other definite signs of metastatic disease. There are small indeterminate low-density hepatic lesions.    02/08/2016 Tumor Marker   Patient's tumor was tested for the following markers: CA-125 Results of the tumor marker test revealed 656.2   02/15/2016 Procedure   CT-guided core biopsy performed of omental mass just deep to the abdominal wall.   02/15/2016 Pathology Results   Omentum, biopsy, left - PAPILLARY SEROUS NEOPLASM, SEE COMMENT. Microscopic Comment There are papillary collections of largely low grade appearing cells with numerous psammoma bodies. Given the limited material it is difficult to distinguish between invasive implants of a serous borderline tumor and serous carcinoma, especially given the low grade appearance.   02/18/2016 Pathology Results   1. Omentum, resection for tumor INVASIVE IMPLANT OF HIGH GRADE SEROUS CARCINOMA 2. Uterus +/- tubes/ovaries, neoplastic HIGH GRADE SEROUS CARCINOMA INVOLVING LEFT TUBAL FIMBRIA SEROUS CARCINOMA WITH PREDOMINANT PSAMMOMA BODIES IMPLANT AT UTERUS SEROSA, BILATERAL FALLOPIAN TUBAL SEROSA, AND ANTERIOR PERITONEAL REFLECTION CERVIX: HISTOLOGICAL UNREMARKABLE ENDOMETRIUM: INACTIVE ENDOMETRIUM MYOMETRIUM: LEIOMYOMA LEFT OVARY: CYSTADENOFIBROMA RIGHT OVARY AND FALLOPIAN TUBE: HISTOLOGICAL UNREMARKABLE Microscopic Comment 2. ONCOLOGY TABLE - FALLOPIAN TUBE 1. Specimen, including laterality: Omentum, uterus, bilateral ovaries and fallopian tubes 2. Procedure: Hysterectomy, bilateral salpingo-oophorectomy and tumor debulking omentectomy 3. Lymph node sampling performed: No 4. Tumor site: uterus serosa, bilateral fallopian tubal serosa, peritoneal and omentum 5. Tumor location in fallopian tube: Left fallopian tubal fimbria 6. Specimen integrity (intact/ruptured/disrupted): Intact 7. Tumor size (cm): multi focal invasive omentum implant greater than 2 cm, left fallopian tube tumor 0.8 cm. 8. Histologic type: Serous carcinoma 9. Grade: 3 10. Microscopic tumor extension: uterus serosa, bilateral fallopian tubal serosa, peritoneal and  omentum 11. Margins: NA 12. Lymph-Vascular invasion: identified 13. Lymph nodes: # examined: 0; # positive: NA 14. TNM: pT3c, pNx 15. FIGO Stage (based on  pathologic findings, needs clinical correlation: IIIC 16. Comment: High grade serous carcinoma multifocally and extensively involves left fallopian tubal fimbria, omentum, uterus serosa, bilateral fallopian tubal serosa and peritoneal. At the left fallopian tube there are small foci of serous tubal intraepithelial carcinoma identified, so we conclude the carcinoma is fallopian tube origin   03/09/2016 Tumor Marker   Patient's tumor was tested for the following markers: CA-125 Results of the tumor marker test revealed 154.4   03/15/2016 Procedure   Technically successful right IJ power-injectable port catheter placement. Ready for routine use.   03/18/2016 - 07/13/2016 Chemotherapy   The patient had 6 cycles of carboplatin and taxol   04/14/2016 Tumor Marker   Patient's tumor was tested for the following markers: CA-125 Results of the tumor marker test revealed 36.7   04/25/2016 Genetic Testing   Patient has genetic testing done for germline mutation Results revealed patient has no mutation   04/28/2016 Tumor Marker   Patient's tumor was tested for the following markers: CA-125 Results of the tumor marker test revealed 24.6   06/21/2016 Tumor Marker   Patient's tumor was tested for the following markers: CA-125 Results of the tumor marker test revealed 16.6   08/01/2016 Tumor Marker   Patient's tumor was tested for the following markers: CA-125 Results of the tumor marker test revealed 17.2   08/05/2016 Imaging   Interval TAH-BSO. No evidence of residual pelvic mass or metastatic disease within the abdomen or pelvis. No other acute findings.    11/04/2016 Tumor Marker   Patient's tumor was tested for the following markers: CA-125 Results of the tumor marker test revealed 13.6   01/20/2017 Tumor Marker   Patient's tumor was tested  for the following markers: CA-125 Results of the tumor marker test revealed 13.8   04/14/2017 Tumor Marker   Patient's tumor was tested for the following markers: CA-125 Results of the tumor marker test revealed 16.1   06/13/2017 Imaging   No acute findings.  No mass or hernia identified.  Colonic diverticulosis, without radiographic evidence of diverticulitis.  Aortic atherosclerosis.   07/03/2017 Tumor Marker   Patient's tumor was tested for the following markers: CA-125 Results of the tumor marker test revealed 18.4   09/19/2017 Tumor Marker   Patient's tumor was tested for the following markers: CA-125 Results of the tumor marker test revealed 18.5   01/23/2018 Tumor Marker   Patient's tumor was tested for the following markers: CA-125 Results of the tumor marker test revealed 35.4   01/30/2018 Imaging   1. No evidence of local fallopian tube carcinoma recurrence within the pelvis. Post hysterectomy. 2. No evidence of metastatic peritoneal disease or omental disease. No solid organ metastasis.    03/20/2018 Tumor Marker   Patient's tumor was tested for the following markers: CA-125 Results of the tumor marker test revealed 73   05/02/2018 Tumor Marker   Patient's tumor was tested for the following markers: CA-125 Results of the tumor marker test revealed 127.1   05/08/2018 Imaging   CT abdomen and pelvis 1. 7 cm left subdiaphragmatic collection of loculated fluid versus low-density soft tissue. Given the patient's history of rising CA 125, metastatic disease considered highly likely.  2. Areas of fluid or recurrent disease identified in the right lower quadrant adjacent to the cecum and along right lower quadrant small bowel loops.   05/14/2018 Tumor Marker   Patient's tumor was tested for the following markers: CA-125 Results of the tumor marker test revealed 166    Genetic  Testing   Patient has genetic testing done for ER/PR and MMR. Results revealed patient has the  following on 02/18/2016 surgical pathology: ER 20%, PR 0% MMR: normal    Genetic Testing   Patient has genetic testing done for MSI. Results revealed patient has the following: MSI stable   05/18/2018 - 12/31/2018 Chemotherapy   The patient had carboplatin and Doxil   07/16/2018 Tumor Marker   Patient's tumor was tested for the following markers: CA-125 Results of the tumor marker test revealed 28.7   08/10/2018 Imaging   CT imaging:  Decreased size of peritoneal soft tissue masses in left upper quadrant, consistent with decreased metastatic disease.  No new or progressive metastatic disease. No other acute findings.  Colonic diverticulosis, without radiographic evidence of diverticulitis.  Stable small paraumbilical ventral hernia containing transverse colon.   08/13/2018 Tumor Marker   Patient's tumor was tested for the following markers: CA-125 Results of the tumor marker test revealed 21.6   08/24/2018 Echocardiogram   LV EF: 60% -  65%   10/15/2018 Tumor Marker   Patient's tumor was tested for the following markers: CA-125 Results of the tumor marker test revealed 20.1   10/19/2018 Imaging   Ct scan of abdomen and pelvis Status post hysterectomy, bilateral salpingo-oophorectomy, and omentectomy.  Two peritoneal implants in the left upper abdomen are stable versus mildly decreased, as above. No abdominopelvic ascites.  No evidence of new/progressive metastatic disease.     11/19/2018 Tumor Marker   Patient's tumor was tested for the following markers: CA-125 Results of the tumor marker test revealed 21.7   11/23/2018 Echocardiogram   1. The left ventricle has normal systolic function of 16-10%. The cavity size was normal. There is no increased left ventricular wall thickness. Echo evidence of impaired diastolic relaxation.  2. The right ventricle has normal systolic function. The cavity was normal. There is no increase in right ventricular wall thickness.  3. The  mitral valve is normal in structure. There is mild mitral annular calcification present.  4. The tricuspid valve is normal in structure.  5. The aortic valve is tricuspid There is mild thickening of the aortic valve.  6. The pulmonic valve was normal in structure. Pulmonic valve regurgitation is mild by color flow Doppler.  7. Normal LV systolic function; mild diastolic dysfunction.   12/31/2018 Tumor Marker   Patient's tumor was tested for the following markers: CA-125 Results of the tumor marker test revealed 17.1   01/31/2019 Imaging   1. Left upper quadrant peritoneal implants described previously have decreased in the interval. No new or progressive findings in the abdomen/pelvis today. No free fluid. 2. Right paraumbilical ventral hernia contains a small knuckle of transverse colon without complicating features. 3.  Aortic Atherosclerois (ICD10-170.0)  Aortic Atherosclerosis (ICD10-I70.0).   01/31/2019 Tumor Marker   Patient's tumor was tested for the following markers: CA-125 Results of the tumor marker test revealed 18.9   02/11/2019 - 03/13/2019 Chemotherapy   The patient is taking Niraparib. She self discontinued after 1 month   02/25/2019 Tumor Marker   Patient's tumor was tested for the following markers: CA-125 Results of the tumor marker test revealed 18.6   05/13/2019 Tumor Marker   Patient's tumor was tested for the following markers: CA-125 Results of the tumor marker test revealed 230   05/22/2019 Imaging   1. Increased size of 4.6 cm soft tissue mass in the gastrosplenic ligament, consistent with metastatic disease. 2. No other sites of metastatic disease identified within  the abdomen or pelvis. 3. Colonic diverticulosis. No radiographic evidence of diverticulitis. 4. Stable small right paraumbilical hernia containing transverse colon.   Aortic Atherosclerosis (ICD10-I70.0).   05/31/2019 - 08/22/2019 Chemotherapy   The patient had bevacizumab and Taxol for  chemotherapy treatment.     05/31/2019 Tumor Marker   Patient's tumor was tested for the following markers: CA-125 Results of the tumor marker test revealed 84   06/21/2019 Tumor Marker   Patient's tumor was tested for the following markers: CA-125 Results of the tumor marker test revealed 49.4.   07/19/2019 Tumor Marker   Patient's tumor was tested for the following markers: CA-125. Results of the tumor marker test revealed 44.5   08/22/2019 Imaging   1. Increase in size of 5.0 cm mass in the gastro splenic ligament consistent with metastatic disease. 2. No additional sites of disease identified within the abdomen or pelvis. 3. Unchanged paraumbilical hernia containing nonobstructed loop of large bowel. 4.  Aortic Atherosclerosis (ICD10-I70.0).     08/30/2019 Tumor Marker   Patient's tumor was tested for the following markers: CA-125 Results of the tumor marker test revealed 71.5     REVIEW OF SYSTEMS:   Constitutional: Denies fevers, chills or abnormal weight loss Eyes: Denies blurriness of vision Ears, nose, mouth, throat, and face: Denies mucositis or sore throat Respiratory: Denies cough, dyspnea or wheezes Cardiovascular: Denies palpitation, chest discomfort or lower extremity swelling Gastrointestinal:  Denies nausea, heartburn or change in bowel habits Skin: Denies abnormal skin rashes Lymphatics: Denies new lymphadenopathy or easy bruising Neurological:Denies numbness, tingling or new weaknesses Behavioral/Psych: Mood is stable, no new changes  All other systems were reviewed with the patient and are negative.  I have reviewed the past medical history, past surgical history, social history and family history with the patient and they are unchanged from previous note.  ALLERGIES:  has No Known Allergies.  MEDICATIONS:  Current Outpatient Medications  Medication Sig Dispense Refill  . Cholecalciferol (VITAMIN D) 2000 units CAPS Take 1 capsule by mouth daily.    Marland Kitchen  ezetimibe (ZETIA) 10 MG tablet Take 10 mg by mouth daily.    Marland Kitchen lidocaine-prilocaine (EMLA) cream Apply to affected area once 30 g 3  . morphine (MSIR) 15 MG tablet Take 1 tablet (15 mg total) by mouth every 4 (four) hours as needed for severe pain. 30 tablet 0  . ondansetron (ZOFRAN) 8 MG tablet Take 1 tablet (8 mg total) by mouth 2 (two) times daily as needed (Nausea or vomiting). 30 tablet 1  . prochlorperazine (COMPAZINE) 10 MG tablet Take 1 tablet (10 mg total) by mouth every 6 (six) hours as needed (Nausea or vomiting). 30 tablet 1   No current facility-administered medications for this visit.    PHYSICAL EXAMINATION: ECOG PERFORMANCE STATUS: 0 - Asymptomatic GENERAL:alert, no distress and comfortable SKIN: skin color, texture, turgor are normal, no rashes or significant lesions EYES: normal, Conjunctiva are pink and non-injected, sclera clear OROPHARYNX:no exudate, no erythema and lips, buccal mucosa, and tongue normal  NECK: supple, thyroid normal size, non-tender, without nodularity LYMPH:  no palpable lymphadenopathy in the cervical, axillary or inguinal LUNGS: clear to auscultation and percussion with normal breathing effort HEART: regular rate & rhythm and no murmurs and no lower extremity edema ABDOMEN:abdomen soft, non-tender and normal bowel sounds Musculoskeletal:no cyanosis of digits and no clubbing  NEURO: alert & oriented x 3 with fluent speech, no focal motor/sensory deficits  LABORATORY DATA:  I have reviewed the data as listed  Component Value Date/Time   NA 138 09/27/2019 0946   NA 139 06/07/2017 1430   K 4.5 09/27/2019 0946   K 3.8 06/07/2017 1430   CL 103 09/27/2019 0946   CO2 26 09/27/2019 0946   CO2 27 06/07/2017 1430   GLUCOSE 124 (H) 09/27/2019 0946   GLUCOSE 124 06/07/2017 1430   BUN 11 09/27/2019 0946   BUN 14.0 06/07/2017 1430   CREATININE 0.77 09/27/2019 0946   CREATININE 0.8 06/07/2017 1430   CALCIUM 9.1 09/27/2019 0946   CALCIUM 9.3  06/07/2017 1430   PROT 6.9 09/27/2019 0946   PROT 6.6 09/30/2016 1027   ALBUMIN 3.3 (L) 09/27/2019 0946   ALBUMIN 3.5 09/30/2016 1027   AST 28 09/27/2019 0946   AST 18 09/30/2016 1027   ALT 36 09/27/2019 0946   ALT 21 09/30/2016 1027   ALKPHOS 74 09/27/2019 0946   ALKPHOS 77 09/30/2016 1027   BILITOT 0.2 (L) 09/27/2019 0946   BILITOT 0.37 09/30/2016 1027   GFRNONAA >60 09/27/2019 0946   GFRAA >60 09/27/2019 0946    No results found for: SPEP, UPEP  Lab Results  Component Value Date   WBC 2.4 (L) 09/27/2019   NEUTROABS 1.0 (L) 09/27/2019   HGB 9.8 (L) 09/27/2019   HCT 30.4 (L) 09/27/2019   MCV 94.4 09/27/2019   PLT 345 09/27/2019      Chemistry      Component Value Date/Time   NA 138 09/27/2019 0946   NA 139 06/07/2017 1430   K 4.5 09/27/2019 0946   K 3.8 06/07/2017 1430   CL 103 09/27/2019 0946   CO2 26 09/27/2019 0946   CO2 27 06/07/2017 1430   BUN 11 09/27/2019 0946   BUN 14.0 06/07/2017 1430   CREATININE 0.77 09/27/2019 0946   CREATININE 0.8 06/07/2017 1430      Component Value Date/Time   CALCIUM 9.1 09/27/2019 0946   CALCIUM 9.3 06/07/2017 1430   ALKPHOS 74 09/27/2019 0946   ALKPHOS 77 09/30/2016 1027   AST 28 09/27/2019 0946   AST 18 09/30/2016 1027   ALT 36 09/27/2019 0946   ALT 21 09/30/2016 1027   BILITOT 0.2 (L) 09/27/2019 0946   BILITOT 0.37 09/30/2016 1027       All questions were answered. The patient knows to call the clinic with any problems, questions or concerns. No barriers to learning was detected.  I spent 15 minutes counseling the patient face to face. The total time spent in the appointment was 20 minutes and more than 50% was on counseling and review of test results  [name], MD 09/27/2019 10:32 AM

## 2019-09-27 NOTE — Patient Instructions (Signed)
[facility] Discharge Instructions for Patients Receiving Chemotherapy  Today you received the following chemotherapy agents: gemzar   To help prevent nausea and vomiting after your treatment, we encourage you to take your nausea medication as directed   If you develop nausea and vomiting that is not controlled by your nausea medication, call the clinic.   BELOW ARE SYMPTOMS THAT SHOULD BE REPORTED IMMEDIATELY:  *FEVER GREATER THAN 100.5 F  *CHILLS WITH OR WITHOUT FEVER  NAUSEA AND VOMITING THAT IS NOT CONTROLLED WITH YOUR NAUSEA MEDICATION  *UNUSUAL SHORTNESS OF BREATH  *UNUSUAL BRUISING OR BLEEDING  TENDERNESS IN MOUTH AND THROAT WITH OR WITHOUT PRESENCE OF ULCERS  *URINARY PROBLEMS  *BOWEL PROBLEMS  UNUSUAL RASH Items with * indicate a potential emergency and should be followed up as soon as possible.  Feel free to call the clinic you have any questions or concerns. The clinic phone number is [phone].  Gemcitabine injection What is this medicine? GEMCITABINE (jem SYE ta been) is a chemotherapy drug. This medicine is used to treat many types of cancer like breast cancer, lung cancer, pancreatic cancer, and ovarian cancer. This medicine may be used for other purposes; ask your health care provider or pharmacist if you have questions. COMMON BRAND NAME(S): Gemzar, Infugem What should I tell my health care provider before I take this medicine? They need to know if you have any of these conditions:  blood disorders  infection  kidney disease  liver disease  lung or breathing disease, like asthma  recent or ongoing radiation therapy  an unusual or allergic reaction to gemcitabine, other chemotherapy, other medicines, foods, dyes, or preservatives  pregnant or trying to get pregnant  breast-feeding How should I use this medicine? This drug is given as an infusion into a vein. It is administered in a hospital or clinic by a specially trained  health care professional. Talk to your pediatrician regarding the use of this medicine in children. Special care may be needed. Overdosage: If you think you have taken too much of this medicine contact a poison control center or emergency room at once. NOTE: This medicine is only for you. Do not share this medicine with others. What if I miss a dose? It is important not to miss your dose. Call your doctor or health care professional if you are unable to keep an appointment. What may interact with this medicine?  medicines to increase blood counts like filgrastim, pegfilgrastim, sargramostim  some other chemotherapy drugs like cisplatin  vaccines Talk to your doctor or health care professional before taking any of these medicines:  acetaminophen  aspirin  ibuprofen  ketoprofen  naproxen This list may not describe all possible interactions. Give your health care provider a list of all the medicines, herbs, non-prescription drugs, or dietary supplements you use. Also tell them if you smoke, drink alcohol, or use illegal drugs. Some items may interact with your medicine. What should I watch for while using this medicine? Visit your doctor for checks on your progress. This drug may make you feel generally unwell. This is not uncommon, as chemotherapy can affect healthy cells as well as cancer cells. Report any side effects. Continue your course of treatment even though you feel ill unless your doctor tells you to stop. In some cases, you may be given additional medicines to help with side effects. Follow all directions for their use. Call your doctor or health care professional for advice if you get a fever, chills or sore  throat, or other symptoms of a cold or flu. Do not treat yourself. This drug decreases your body's ability to fight infections. Try to avoid being around people who are sick. This medicine may increase your risk to bruise or bleed. Call your doctor or health care  professional if you notice any unusual bleeding. Be careful brushing and flossing your teeth or using a toothpick because you may get an infection or bleed more easily. If you have any dental work done, tell your dentist you are receiving this medicine. Avoid taking products that contain aspirin, acetaminophen, ibuprofen, naproxen, or ketoprofen unless instructed by your doctor. These medicines may hide a fever. Do not become pregnant while taking this medicine or for 6 months after stopping it. Women should inform their doctor if they wish to become pregnant or think they might be pregnant. Men should not father a child while taking this medicine and for 3 months after stopping it. There is a potential for serious side effects to an unborn child. Talk to your health care professional or pharmacist for more information. Do not breast-feed an infant while taking this medicine or for at least 1 week after stopping it. Men should inform their doctors if they wish to father a child. This medicine may lower sperm counts. Talk with your doctor or health care professional if you are concerned about your fertility. What side effects may I notice from receiving this medicine? Side effects that you should report to your doctor or health care professional as soon as possible:  allergic reactions like skin rash, itching or hives, swelling of the face, lips, or tongue  breathing problems  pain, redness, or irritation at site where injected  signs and symptoms of a dangerous change in heartbeat or heart rhythm like chest pain; dizziness; fast or irregular heartbeat; palpitations; feeling faint or lightheaded, falls; breathing problems  signs of decreased platelets or bleeding - bruising, pinpoint red spots on the skin, black, tarry stools, blood in the urine  signs of decreased red blood cells - unusually weak or tired, feeling faint or lightheaded, falls  signs of infection - fever or chills, cough, sore  throat, pain or difficulty passing urine  signs and symptoms of kidney injury like trouble passing urine or change in the amount of urine  signs and symptoms of liver injury like dark yellow or brown urine; general ill feeling or flu-like symptoms; light-colored stools; loss of appetite; nausea; right upper belly pain; unusually weak or tired; yellowing of the eyes or skin  swelling of ankles, feet, hands Side effects that usually do not require medical attention (report to your doctor or health care professional if they continue or are bothersome):  constipation  diarrhea  hair loss  loss of appetite  nausea  rash  vomiting This list may not describe all possible side effects. Call your doctor for medical advice about side effects. You may report side effects to FDA at 1-800-FDA-1088. Where should I keep my medicine? This drug is given in a hospital or clinic and will not be stored at home. NOTE: This sheet is a summary. It may not cover all possible information. If you have questions about this medicine, talk to your doctor, pharmacist, or health care provider.  2020 [employer]/Gold Standard (2017-12-27 18:06:11)

## 2019-09-27 NOTE — Assessment & Plan Note (Signed)
She has minimum pain She has morphine sulfate to take as needed

## 2019-09-27 NOTE — Assessment & Plan Note (Signed)
She has significant pancytopenia since chemotherapy She is not symptomatic from pancytopenia We discussed neutropenic precaution She will continue with similar dose adjustment as before

## 2019-09-27 NOTE — Assessment & Plan Note (Signed)
She has history of intermittent pancytopenia  We will observe closely She does not need prophylactic G-CSF support I recommend minimum 6 doses of chemo before repeating imaging study, due next month

## 2019-09-28 LAB — CA 125: Cancer Antigen (CA) 125: 38.5 U/mL — ABNORMAL HIGH (ref 0.0–38.1)

## 2019-09-30 ENCOUNTER — Telehealth: Payer: Self-pay | Admitting: *Deleted

## 2019-09-30 ENCOUNTER — Telehealth: Payer: Self-pay | Admitting: Hematology and Oncology

## 2019-09-30 NOTE — Telephone Encounter (Signed)
-----   Message from [name], MD sent at 09/30/2019  8:22 AM EST ----- Regarding: pls let her know ca-125 is better last week

## 2019-09-30 NOTE — Telephone Encounter (Signed)
Scheduled appt per 12/11 sch message - pt is aware of appt date and time   

## 2019-09-30 NOTE — Telephone Encounter (Signed)
Telephone call to patient and advised lab results. Patient appreciated call and and knows to contact us with any concerns or questions.

## 2019-10-14 ENCOUNTER — Inpatient Hospital Stay: Payer: Medicare Other

## 2019-10-14 ENCOUNTER — Other Ambulatory Visit: Payer: Self-pay

## 2019-10-14 VITALS — BP 117/55 | HR 62 | Temp 98.0°F | Resp 16 | Wt 144.6 lb

## 2019-10-14 DIAGNOSIS — Z7189 Other specified counseling: Secondary | ICD-10-CM

## 2019-10-14 DIAGNOSIS — C5700 Malignant neoplasm of unspecified fallopian tube: Secondary | ICD-10-CM

## 2019-10-14 DIAGNOSIS — C5702 Malignant neoplasm of left fallopian tube: Secondary | ICD-10-CM

## 2019-10-14 DIAGNOSIS — Z95828 Presence of other vascular implants and grafts: Secondary | ICD-10-CM

## 2019-10-14 DIAGNOSIS — Z5111 Encounter for antineoplastic chemotherapy: Secondary | ICD-10-CM | POA: Diagnosis not present

## 2019-10-14 LAB — CMP (CANCER CENTER ONLY)
ALT: 30 U/L (ref 0–44)
AST: 20 U/L (ref 15–41)
Albumin: 3.3 g/dL — ABNORMAL LOW (ref 3.5–5.0)
Alkaline Phosphatase: 68 U/L (ref 38–126)
Anion gap: 10 (ref 5–15)
BUN: 12 mg/dL (ref 8–23)
CO2: 24 mmol/L (ref 22–32)
Calcium: 8.6 mg/dL — ABNORMAL LOW (ref 8.9–10.3)
Chloride: 103 mmol/L (ref 98–111)
Creatinine: 0.71 mg/dL (ref 0.44–1.00)
GFR, Est AFR Am: >60 mL/min (ref >60)
GFR, Estimated: >60 mL/min (ref >60)
Glucose, Bld: 94 mg/dL (ref 70–99)
Potassium: 4.5 mmol/L (ref 3.5–5.1)
Sodium: 137 mmol/L (ref 135–145)
Total Bilirubin: <0.2 mg/dL — ABNORMAL LOW (ref 0.3–1.2)
Total Protein: 6.4 g/dL — ABNORMAL LOW (ref 6.5–8.1)

## 2019-10-14 LAB — CBC WITH DIFFERENTIAL (CANCER CENTER ONLY)
Abs Immature Granulocytes: 0.02 10*3/uL (ref 0.00–0.07)
Basophils Absolute: 0 10*3/uL (ref 0.0–0.1)
Basophils Relative: 1 %
Eosinophils Absolute: 0.2 10*3/uL (ref 0.0–0.5)
Eosinophils Relative: 3 %
HCT: 32.1 % — ABNORMAL LOW (ref 36.0–46.0)
Hemoglobin: 10.3 g/dL — ABNORMAL LOW (ref 12.0–15.0)
Immature Granulocytes: 0 %
Lymphocytes Relative: 17 %
Lymphs Abs: 0.8 10*3/uL (ref 0.7–4.0)
MCH: 30.8 pg (ref 26.0–34.0)
MCHC: 32.1 g/dL (ref 30.0–36.0)
MCV: 96.1 fL (ref 80.0–100.0)
Monocytes Absolute: 0.7 10*3/uL (ref 0.1–1.0)
Monocytes Relative: 13 %
Neutro Abs: 3.4 10*3/uL (ref 1.7–7.7)
Neutrophils Relative %: 66 %
Platelet Count: 415 10*3/uL — ABNORMAL HIGH (ref 150–400)
RBC: 3.34 MIL/uL — ABNORMAL LOW (ref 3.87–5.11)
RDW: 16.4 % — ABNORMAL HIGH (ref 11.5–15.5)
WBC Count: 5.1 10*3/uL (ref 4.0–10.5)
nRBC: 0 % (ref 0.0–0.2)

## 2019-10-14 MED ORDER — SODIUM CHLORIDE 0.9% FLUSH
10.0000 mL | INTRAVENOUS | Status: DC | PRN
  Administered 2019-10-14: 10 mL
  Filled 2019-10-14: qty 10

## 2019-10-14 MED ORDER — SODIUM CHLORIDE 0.9 % IV SOLN
Freq: Once | INTRAVENOUS | Status: AC
  Filled 2019-10-14: qty 250

## 2019-10-14 MED ORDER — HEPARIN SOD (PORK) LOCK FLUSH 100 UNIT/ML IV SOLN
500.0000 [IU] | Freq: Once | INTRAVENOUS | Status: AC | PRN
  Administered 2019-10-14: 500 [IU]
  Filled 2019-10-14: qty 5

## 2019-10-14 MED ORDER — SODIUM CHLORIDE 0.9% FLUSH
10.0000 mL | Freq: Once | INTRAVENOUS | Status: AC
  Administered 2019-10-14: 10 mL
  Filled 2019-10-14: qty 10

## 2019-10-14 MED ORDER — PROCHLORPERAZINE MALEATE 10 MG PO TABS
ORAL_TABLET | ORAL | Status: AC
  Filled 2019-10-14: qty 1

## 2019-10-14 MED ORDER — SODIUM CHLORIDE 0.9 % IV SOLN
Freq: Once | INTRAVENOUS | Status: DC
  Filled 2019-10-14: qty 250

## 2019-10-14 MED ORDER — PROCHLORPERAZINE MALEATE 10 MG PO TABS
10.0000 mg | ORAL_TABLET | Freq: Once | ORAL | Status: AC
  Administered 2019-10-14: 10 mg via ORAL

## 2019-10-14 MED ORDER — SODIUM CHLORIDE 0.9 % IV SOLN
600.0000 mg/m2 | Freq: Once | INTRAVENOUS | Status: AC
  Administered 2019-10-14: 1026 mg via INTRAVENOUS
  Filled 2019-10-14: qty 26.98

## 2019-10-15 LAB — CA 125: Cancer Antigen (CA) 125: 40.8 U/mL — ABNORMAL HIGH (ref 0.0–38.1)

## 2019-10-16 ENCOUNTER — Telehealth: Payer: Self-pay | Admitting: Oncology

## 2019-10-16 NOTE — Telephone Encounter (Signed)
Priya called and said her brother in law had contacted her about a new treatment for ovarian cancer - Lymparza - and was wondering what it was.  Advised her that it is a PARP inhibitor similar to Zejula that she had taken before.  She verbalized understanding.

## 2019-10-21 ENCOUNTER — Inpatient Hospital Stay (HOSPITAL_BASED_OUTPATIENT_CLINIC_OR_DEPARTMENT_OTHER): Payer: Medicare Other | Admitting: Hematology and Oncology

## 2019-10-21 ENCOUNTER — Encounter: Payer: Self-pay | Admitting: Hematology and Oncology

## 2019-10-21 ENCOUNTER — Inpatient Hospital Stay: Payer: Medicare Other | Attending: Gynecologic Oncology

## 2019-10-21 ENCOUNTER — Inpatient Hospital Stay: Payer: Medicare Other

## 2019-10-21 ENCOUNTER — Other Ambulatory Visit: Payer: Self-pay

## 2019-10-21 VITALS — BP 134/69 | HR 74 | Temp 100.0°F | Resp 18 | Ht 64.0 in | Wt 145.6 lb

## 2019-10-21 DIAGNOSIS — C5700 Malignant neoplasm of unspecified fallopian tube: Secondary | ICD-10-CM | POA: Diagnosis not present

## 2019-10-21 DIAGNOSIS — D61818 Other pancytopenia: Secondary | ICD-10-CM | POA: Diagnosis not present

## 2019-10-21 DIAGNOSIS — T451X5A Adverse effect of antineoplastic and immunosuppressive drugs, initial encounter: Secondary | ICD-10-CM | POA: Diagnosis not present

## 2019-10-21 DIAGNOSIS — C786 Secondary malignant neoplasm of retroperitoneum and peritoneum: Secondary | ICD-10-CM | POA: Diagnosis not present

## 2019-10-21 DIAGNOSIS — C5702 Malignant neoplasm of left fallopian tube: Secondary | ICD-10-CM | POA: Diagnosis not present

## 2019-10-21 DIAGNOSIS — Z5111 Encounter for antineoplastic chemotherapy: Secondary | ICD-10-CM | POA: Diagnosis not present

## 2019-10-21 DIAGNOSIS — G620 Drug-induced polyneuropathy: Secondary | ICD-10-CM | POA: Diagnosis not present

## 2019-10-21 DIAGNOSIS — G893 Neoplasm related pain (acute) (chronic): Secondary | ICD-10-CM

## 2019-10-21 DIAGNOSIS — Z7189 Other specified counseling: Secondary | ICD-10-CM

## 2019-10-21 DIAGNOSIS — Z95828 Presence of other vascular implants and grafts: Secondary | ICD-10-CM

## 2019-10-21 LAB — CBC WITH DIFFERENTIAL (CANCER CENTER ONLY)
Abs Immature Granulocytes: 0.01 10*3/uL (ref 0.00–0.07)
Basophils Absolute: 0 10*3/uL (ref 0.0–0.1)
Basophils Relative: 1 %
Eosinophils Absolute: 0 10*3/uL (ref 0.0–0.5)
Eosinophils Relative: 1 %
HCT: 31.5 % — ABNORMAL LOW (ref 36.0–46.0)
Hemoglobin: 10.4 g/dL — ABNORMAL LOW (ref 12.0–15.0)
Immature Granulocytes: 0 %
Lymphocytes Relative: 40 %
Lymphs Abs: 1.2 10*3/uL (ref 0.7–4.0)
MCH: 30.9 pg (ref 26.0–34.0)
MCHC: 33 g/dL (ref 30.0–36.0)
MCV: 93.5 fL (ref 80.0–100.0)
Monocytes Absolute: 0.5 10*3/uL (ref 0.1–1.0)
Monocytes Relative: 17 %
Neutro Abs: 1.3 10*3/uL — ABNORMAL LOW (ref 1.7–7.7)
Neutrophils Relative %: 41 %
Platelet Count: 293 10*3/uL (ref 150–400)
RBC: 3.37 MIL/uL — ABNORMAL LOW (ref 3.87–5.11)
RDW: 16 % — ABNORMAL HIGH (ref 11.5–15.5)
WBC Count: 3.1 10*3/uL — ABNORMAL LOW (ref 4.0–10.5)
nRBC: 0 % (ref 0.0–0.2)

## 2019-10-21 LAB — CMP (CANCER CENTER ONLY)
ALT: 92 U/L — ABNORMAL HIGH (ref 0–44)
AST: 44 U/L — ABNORMAL HIGH (ref 15–41)
Albumin: 3.6 g/dL (ref 3.5–5.0)
Alkaline Phosphatase: 70 U/L (ref 38–126)
Anion gap: 8 (ref 5–15)
BUN: 13 mg/dL (ref 8–23)
CO2: 26 mmol/L (ref 22–32)
Calcium: 9.2 mg/dL (ref 8.9–10.3)
Chloride: 103 mmol/L (ref 98–111)
Creatinine: 0.68 mg/dL (ref 0.44–1.00)
GFR, Est AFR Am: >60 mL/min (ref >60)
GFR, Estimated: >60 mL/min (ref >60)
Glucose, Bld: 108 mg/dL — ABNORMAL HIGH (ref 70–99)
Potassium: 4.5 mmol/L (ref 3.5–5.1)
Sodium: 137 mmol/L (ref 135–145)
Total Bilirubin: 0.2 mg/dL — ABNORMAL LOW (ref 0.3–1.2)
Total Protein: 6.7 g/dL (ref 6.5–8.1)

## 2019-10-21 MED ORDER — PROCHLORPERAZINE MALEATE 10 MG PO TABS
ORAL_TABLET | ORAL | Status: AC
  Filled 2019-10-21: qty 1

## 2019-10-21 MED ORDER — SODIUM CHLORIDE 0.9% FLUSH
10.0000 mL | INTRAVENOUS | Status: DC | PRN
  Administered 2019-10-21: 16:00:00 10 mL
  Filled 2019-10-21: qty 10

## 2019-10-21 MED ORDER — PROCHLORPERAZINE MALEATE 10 MG PO TABS
10.0000 mg | ORAL_TABLET | Freq: Once | ORAL | Status: AC
  Administered 2019-10-21: 10 mg via ORAL

## 2019-10-21 MED ORDER — SODIUM CHLORIDE 0.9% FLUSH
10.0000 mL | Freq: Once | INTRAVENOUS | Status: AC
  Administered 2019-10-21: 10 mL
  Filled 2019-10-21: qty 10

## 2019-10-21 MED ORDER — SODIUM CHLORIDE 0.9 % IV SOLN
600.0000 mg/m2 | Freq: Once | INTRAVENOUS | Status: AC
  Administered 2019-10-21: 1026 mg via INTRAVENOUS
  Filled 2019-10-21: qty 26.98

## 2019-10-21 MED ORDER — SODIUM CHLORIDE 0.9 % IV SOLN
Freq: Once | INTRAVENOUS | Status: AC
  Filled 2019-10-21: qty 250

## 2019-10-21 MED ORDER — HEPARIN SOD (PORK) LOCK FLUSH 100 UNIT/ML IV SOLN
500.0000 [IU] | Freq: Once | INTRAVENOUS | Status: AC | PRN
  Administered 2019-10-21: 500 [IU]
  Filled 2019-10-21: qty 5

## 2019-10-21 NOTE — Assessment & Plan Note (Signed)
She has significant pancytopenia since chemotherapy She is not symptomatic from pancytopenia We discussed neutropenic precaution She will continue with similar dose adjustment as before

## 2019-10-21 NOTE — Patient Instructions (Signed)
[facility] Discharge Instructions for Patients Receiving Chemotherapy  Today you received the following chemotherapy agents: gemzar   To help prevent nausea and vomiting after your treatment, we encourage you to take your nausea medication as directed   If you develop nausea and vomiting that is not controlled by your nausea medication, call the clinic.   BELOW ARE SYMPTOMS THAT SHOULD BE REPORTED IMMEDIATELY:  *FEVER GREATER THAN 100.5 F  *CHILLS WITH OR WITHOUT FEVER  NAUSEA AND VOMITING THAT IS NOT CONTROLLED WITH YOUR NAUSEA MEDICATION  *UNUSUAL SHORTNESS OF BREATH  *UNUSUAL BRUISING OR BLEEDING  TENDERNESS IN MOUTH AND THROAT WITH OR WITHOUT PRESENCE OF ULCERS  *URINARY PROBLEMS  *BOWEL PROBLEMS  UNUSUAL RASH Items with * indicate a potential emergency and should be followed up as soon as possible.  Feel free to call the clinic you have any questions or concerns. The clinic phone number is [phone].  Gemcitabine injection What is this medicine? GEMCITABINE (jem SYE ta been) is a chemotherapy drug. This medicine is used to treat many types of cancer like breast cancer, lung cancer, pancreatic cancer, and ovarian cancer. This medicine may be used for other purposes; ask your health care provider or pharmacist if you have questions. COMMON BRAND NAME(S): Gemzar, Infugem What should I tell my health care provider before I take this medicine? They need to know if you have any of these conditions:  blood disorders  infection  kidney disease  liver disease  lung or breathing disease, like asthma  recent or ongoing radiation therapy  an unusual or allergic reaction to gemcitabine, other chemotherapy, other medicines, foods, dyes, or preservatives  pregnant or trying to get pregnant  breast-feeding How should I use this medicine? This drug is given as an infusion into a vein. It is administered in a hospital or clinic by a specially trained  health care professional. Talk to your pediatrician regarding the use of this medicine in children. Special care may be needed. Overdosage: If you think you have taken too much of this medicine contact a poison control center or emergency room at once. NOTE: This medicine is only for you. Do not share this medicine with others. What if I miss a dose? It is important not to miss your dose. Call your doctor or health care professional if you are unable to keep an appointment. What may interact with this medicine?  medicines to increase blood counts like filgrastim, pegfilgrastim, sargramostim  some other chemotherapy drugs like cisplatin  vaccines Talk to your doctor or health care professional before taking any of these medicines:  acetaminophen  aspirin  ibuprofen  ketoprofen  naproxen This list may not describe all possible interactions. Give your health care provider a list of all the medicines, herbs, non-prescription drugs, or dietary supplements you use. Also tell them if you smoke, drink alcohol, or use illegal drugs. Some items may interact with your medicine. What should I watch for while using this medicine? Visit your doctor for checks on your progress. This drug may make you feel generally unwell. This is not uncommon, as chemotherapy can affect healthy cells as well as cancer cells. Report any side effects. Continue your course of treatment even though you feel ill unless your doctor tells you to stop. In some cases, you may be given additional medicines to help with side effects. Follow all directions for their use. Call your doctor or health care professional for advice if you get a fever, chills or sore  throat, or other symptoms of a cold or flu. Do not treat yourself. This drug decreases your body's ability to fight infections. Try to avoid being around people who are sick. This medicine may increase your risk to bruise or bleed. Call your doctor or health care  professional if you notice any unusual bleeding. Be careful brushing and flossing your teeth or using a toothpick because you may get an infection or bleed more easily. If you have any dental work done, tell your dentist you are receiving this medicine. Avoid taking products that contain aspirin, acetaminophen, ibuprofen, naproxen, or ketoprofen unless instructed by your doctor. These medicines may hide a fever. Do not become pregnant while taking this medicine or for 6 months after stopping it. Women should inform their doctor if they wish to become pregnant or think they might be pregnant. Men should not father a child while taking this medicine and for 3 months after stopping it. There is a potential for serious side effects to an unborn child. Talk to your health care professional or pharmacist for more information. Do not breast-feed an infant while taking this medicine or for at least 1 week after stopping it. Men should inform their doctors if they wish to father a child. This medicine may lower sperm counts. Talk with your doctor or health care professional if you are concerned about your fertility. What side effects may I notice from receiving this medicine? Side effects that you should report to your doctor or health care professional as soon as possible:  allergic reactions like skin rash, itching or hives, swelling of the face, lips, or tongue  breathing problems  pain, redness, or irritation at site where injected  signs and symptoms of a dangerous change in heartbeat or heart rhythm like chest pain; dizziness; fast or irregular heartbeat; palpitations; feeling faint or lightheaded, falls; breathing problems  signs of decreased platelets or bleeding - bruising, pinpoint red spots on the skin, black, tarry stools, blood in the urine  signs of decreased red blood cells - unusually weak or tired, feeling faint or lightheaded, falls  signs of infection - fever or chills, cough, sore  throat, pain or difficulty passing urine  signs and symptoms of kidney injury like trouble passing urine or change in the amount of urine  signs and symptoms of liver injury like dark yellow or brown urine; general ill feeling or flu-like symptoms; light-colored stools; loss of appetite; nausea; right upper belly pain; unusually weak or tired; yellowing of the eyes or skin  swelling of ankles, feet, hands Side effects that usually do not require medical attention (report to your doctor or health care professional if they continue or are bothersome):  constipation  diarrhea  hair loss  loss of appetite  nausea  rash  vomiting This list may not describe all possible side effects. Call your doctor for medical advice about side effects. You may report side effects to FDA at 1-800-FDA-1088. Where should I keep my medicine? This drug is given in a hospital or clinic and will not be stored at home. NOTE: This sheet is a summary. It may not cover all possible information. If you have questions about this medicine, talk to your doctor, pharmacist, or health care provider.  2020 [employer] Standard (2017-12-27 18:06:11)

## 2019-10-21 NOTE — Progress Notes (Signed)
Labs reviewed by Dr. Alvy Bimler. Ok to treatment today.

## 2019-10-21 NOTE — Progress Notes (Signed)
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 Her pain went away and she have decline in tumor marker monitoring Recommend we proceed with treatment as scheduled this month I plan to repeat CT imaging next month for objective assessment of response to therapy  Pancytopenia, acquired ([place]) She has significant pancytopenia since chemotherapy She is not symptomatic from pancytopenia We discussed neutropenic precaution She will continue with similar dose adjustment as before  Chemotherapy-induced peripheral neuropathy ([place]) She denies worsening neuropathy while on treatment  Cancer associated pain She has no recent pain I suspect she have good response to treatment She had pain medicine to take as needed   Orders Placed This Encounter  Procedures  . CT Abdomen Pelvis W Contrast    Standing Status:   Future    Standing Expiration Date:   10/20/2020    Order Specific Question:   If indicated for the ordered procedure, I authorize the administration of contrast media per Radiology protocol    Answer:   Yes    Order Specific Question:   Preferred imaging location?    Answer:   [facility]    Order Specific Question:   Radiology Contrast Protocol - do NOT remove file path    Answer:   [email or web address]    INTERVAL HISTORY: Please see below for problem oriented charting. 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 Extensive omental nodularity highly worrisome for peritoneal carcinomatosis. Late recurrence of melanoma can present as peritoneal carcinomatosis. Ovarian cancer more commonly presents in this manner. Patient does have a predominately low density right adnexal lesion measuring up to 3.5 cm, although this lesion is not typical for ovarian cancer. 2. No evidence of bowel or ureteral obstruction. 3. No other definite signs of metastatic disease. There are small indeterminate low-density hepatic lesions.   02/08/2016 Tumor Marker   Patient's tumor was tested for the following markers: CA-125 Results of the tumor marker test revealed 656.2   02/15/2016 Procedure   CT-guided core biopsy performed of omental mass just deep to the abdominal wall.   02/15/2016 Pathology Results   Omentum, biopsy, left - PAPILLARY SEROUS NEOPLASM, SEE COMMENT. Microscopic Comment There are papillary collections of largely low grade appearing cells with numerous psammoma bodies. Given the limited material it is difficult to distinguish between invasive implants of a serous borderline tumor and serous carcinoma, especially given the low grade appearance.   02/18/2016 Pathology Results   1. Omentum, resection for tumor INVASIVE IMPLANT OF HIGH GRADE SEROUS CARCINOMA 2. Uterus +/- tubes/ovaries, neoplastic HIGH GRADE SEROUS CARCINOMA INVOLVING LEFT TUBAL FIMBRIA SEROUS CARCINOMA WITH PREDOMINANT PSAMMOMA  BODIES IMPLANT AT UTERUS SEROSA, BILATERAL FALLOPIAN TUBAL SEROSA, AND ANTERIOR PERITONEAL REFLECTION CERVIX: HISTOLOGICAL UNREMARKABLE ENDOMETRIUM: INACTIVE  ENDOMETRIUM MYOMETRIUM: LEIOMYOMA LEFT OVARY: CYSTADENOFIBROMA RIGHT OVARY AND FALLOPIAN TUBE: HISTOLOGICAL UNREMARKABLE Microscopic Comment 2. ONCOLOGY TABLE - FALLOPIAN TUBE 1. Specimen, including laterality: Omentum, uterus, bilateral ovaries and fallopian tubes 2. Procedure: Hysterectomy, bilateral salpingo-oophorectomy and tumor debulking omentectomy 3. Lymph node sampling performed: No 4. Tumor site: uterus serosa, bilateral fallopian tubal serosa, peritoneal and omentum 5. Tumor location in fallopian tube: Left fallopian tubal fimbria 6. Specimen integrity (intact/ruptured/disrupted): Intact 7. Tumor size (cm): multi focal invasive omentum implant greater than 2 cm, left fallopian tube tumor 0.8 cm. 8. Histologic type: Serous carcinoma 9. Grade: 3 10. Microscopic tumor extension: uterus serosa, bilateral fallopian tubal serosa, peritoneal and omentum 11. Margins: NA 12. Lymph-Vascular invasion: identified 13. Lymph nodes: # examined: 0; # positive: NA 14. TNM: pT3c, pNx 15. FIGO Stage (based on pathologic findings, needs clinical correlation: IIIC 16. Comment: High grade serous carcinoma multifocally and extensively involves left fallopian tubal fimbria, omentum, uterus serosa, bilateral fallopian tubal serosa and peritoneal. At the left fallopian tube there are small foci of serous tubal intraepithelial carcinoma identified, so we conclude the carcinoma is fallopian tube origin   03/09/2016 Tumor Marker   Patient's tumor was tested for the following markers: CA-125 Results of the tumor marker test revealed 154.4   03/15/2016 Procedure   Technically successful right IJ power-injectable port catheter placement. Ready for routine use.   03/18/2016 - 07/13/2016 Chemotherapy   The patient had 6 cycles of carboplatin and taxol   04/14/2016 Tumor Marker   Patient's tumor was tested for the following markers: CA-125 Results of the tumor marker test revealed 36.7   04/25/2016 Genetic  Testing   Patient has genetic testing done for germline mutation Results revealed patient has no mutation   04/28/2016 Tumor Marker   Patient's tumor was tested for the following markers: CA-125 Results of the tumor marker test revealed 24.6   06/21/2016 Tumor Marker   Patient's tumor was tested for the following markers: CA-125 Results of the tumor marker test revealed 16.6   08/01/2016 Tumor Marker   Patient's tumor was tested for the following markers: CA-125 Results of the tumor marker test revealed 17.2   08/05/2016 Imaging   Interval TAH-BSO. No evidence of residual pelvic mass or metastatic disease within the abdomen or pelvis. No other acute findings.    11/04/2016 Tumor Marker   Patient's tumor was tested for the following markers: CA-125 Results of the tumor marker test revealed 13.6   01/20/2017 Tumor Marker   Patient's tumor was tested for the following markers: CA-125 Results of the tumor marker test revealed 13.8   04/14/2017 Tumor Marker   Patient's tumor was tested for the following markers: CA-125 Results of the tumor marker test revealed 16.1   06/13/2017 Imaging   No acute findings.  No mass or hernia identified.  Colonic diverticulosis, without radiographic evidence of diverticulitis.  Aortic atherosclerosis.   07/03/2017 Tumor Marker   Patient's tumor was tested for the following markers: CA-125 Results of the tumor marker test revealed 18.4   09/19/2017 Tumor Marker   Patient's tumor was tested for the following markers: CA-125 Results of the tumor marker test revealed 18.5   01/23/2018 Tumor Marker   Patient's tumor was tested for the following markers: CA-125 Results of the tumor marker test revealed 35.4   01/30/2018 Imaging   1. No evidence of local fallopian tube carcinoma recurrence  within the pelvis. Post hysterectomy. 2. No evidence of metastatic peritoneal disease or omental disease. No solid organ metastasis.    03/20/2018 Tumor Marker    Patient's tumor was tested for the following markers: CA-125 Results of the tumor marker test revealed 73   05/02/2018 Tumor Marker   Patient's tumor was tested for the following markers: CA-125 Results of the tumor marker test revealed 127.1   05/08/2018 Imaging   CT abdomen and pelvis 1. 7 cm left subdiaphragmatic collection of loculated fluid versus low-density soft tissue. Given the patient's history of rising CA 125, metastatic disease considered highly likely.  2. Areas of fluid or recurrent disease identified in the right lower quadrant adjacent to the cecum and along right lower quadrant small bowel loops.   05/14/2018 Tumor Marker   Patient's tumor was tested for the following markers: CA-125 Results of the tumor marker test revealed 166    Genetic Testing   Patient has genetic testing done for ER/PR and MMR. Results revealed patient has the following on 02/18/2016 surgical pathology: ER 20%, PR 0% MMR: normal    Genetic Testing   Patient has genetic testing done for MSI. Results revealed patient has the following: MSI stable   05/18/2018 - 12/31/2018 Chemotherapy   The patient had carboplatin and Doxil   07/16/2018 Tumor Marker   Patient's tumor was tested for the following markers: CA-125 Results of the tumor marker test revealed 28.7   08/10/2018 Imaging   CT imaging:  Decreased size of peritoneal soft tissue masses in left upper quadrant, consistent with decreased metastatic disease.  No new or progressive metastatic disease. No other acute findings.  Colonic diverticulosis, without radiographic evidence of diverticulitis.  Stable small paraumbilical ventral hernia containing transverse colon.   08/13/2018 Tumor Marker   Patient's tumor was tested for the following markers: CA-125 Results of the tumor marker test revealed 21.6   08/24/2018 Echocardiogram   LV EF: 60% -  65%   10/15/2018 Tumor Marker   Patient's tumor was tested for the following markers:  CA-125 Results of the tumor marker test revealed 20.1   10/19/2018 Imaging   Ct scan of abdomen and pelvis Status post hysterectomy, bilateral salpingo-oophorectomy, and omentectomy.  Two peritoneal implants in the left upper abdomen are stable versus mildly decreased, as above. No abdominopelvic ascites.  No evidence of new/progressive metastatic disease.     11/19/2018 Tumor Marker   Patient's tumor was tested for the following markers: CA-125 Results of the tumor marker test revealed 21.7   11/23/2018 Echocardiogram   1. The left ventricle has normal systolic function of 69-79%. The cavity size was normal. There is no increased left ventricular wall thickness. Echo evidence of impaired diastolic relaxation.  2. The right ventricle has normal systolic function. The cavity was normal. There is no increase in right ventricular wall thickness.  3. The mitral valve is normal in structure. There is mild mitral annular calcification present.  4. The tricuspid valve is normal in structure.  5. The aortic valve is tricuspid There is mild thickening of the aortic valve.  6. The pulmonic valve was normal in structure. Pulmonic valve regurgitation is mild by color flow Doppler.  7. Normal LV systolic function; mild diastolic dysfunction.   12/31/2018 Tumor Marker   Patient's tumor was tested for the following markers: CA-125 Results of the tumor marker test revealed 17.1   01/31/2019 Imaging   1. Left upper quadrant peritoneal implants described previously have decreased in the interval. No  new or progressive findings in the abdomen/pelvis today. No free fluid. 2. Right paraumbilical ventral hernia contains a small knuckle of transverse colon without complicating features. 3.  Aortic Atherosclerois (ICD10-170.0)  Aortic Atherosclerosis (ICD10-I70.0).   01/31/2019 Tumor Marker   Patient's tumor was tested for the following markers: CA-125 Results of the tumor marker test revealed 18.9    02/11/2019 - 03/13/2019 Chemotherapy   The patient is taking Niraparib. She self discontinued after 1 month   02/25/2019 Tumor Marker   Patient's tumor was tested for the following markers: CA-125 Results of the tumor marker test revealed 18.6   05/13/2019 Tumor Marker   Patient's tumor was tested for the following markers: CA-125 Results of the tumor marker test revealed 230   05/22/2019 Imaging   1. Increased size of 4.6 cm soft tissue mass in the gastrosplenic ligament, consistent with metastatic disease. 2. No other sites of metastatic disease identified within the abdomen or pelvis. 3. Colonic diverticulosis. No radiographic evidence of diverticulitis. 4. Stable small right paraumbilical hernia containing transverse colon.   Aortic Atherosclerosis (ICD10-I70.0).   05/31/2019 - 08/22/2019 Chemotherapy   The patient had bevacizumab and Taxol for chemotherapy treatment.     05/31/2019 Tumor Marker   Patient's tumor was tested for the following markers: CA-125 Results of the tumor marker test revealed 84   06/21/2019 Tumor Marker   Patient's tumor was tested for the following markers: CA-125 Results of the tumor marker test revealed 49.4.   07/19/2019 Tumor Marker   Patient's tumor was tested for the following markers: CA-125. Results of the tumor marker test revealed 44.5   08/22/2019 Imaging   1. Increase in size of 5.0 cm mass in the gastro splenic ligament consistent with metastatic disease. 2. No additional sites of disease identified within the abdomen or pelvis. 3. Unchanged paraumbilical hernia containing nonobstructed loop of large bowel. 4.  Aortic Atherosclerosis (ICD10-I70.0).     08/30/2019 Tumor Marker   Patient's tumor was tested for the following markers: CA-125 Results of the tumor marker test revealed 71.5   08/30/2019 -  Chemotherapy   The patient had gemzar for chemotherapy treatment.     09/27/2019 Tumor Marker   Patient's tumor was tested for the  following markers: CA-125. Results of the tumor marker test revealed 38.5   10/14/2019 Tumor Marker   Patient's tumor was tested for the following markers: CA-125 Results of the tumor marker test revealed 40.8     REVIEW OF SYSTEMS:   Constitutional: Denies fevers, chills or abnormal weight loss Eyes: Denies blurriness of vision Ears, nose, mouth, throat, and face: Denies mucositis or sore throat Respiratory: Denies cough, dyspnea or wheezes Cardiovascular: Denies palpitation, chest discomfort or lower extremity swelling Gastrointestinal:  Denies nausea, heartburn or change in bowel habits Skin: Denies abnormal skin rashes Lymphatics: Denies new lymphadenopathy or easy bruising Behavioral/Psych: Mood is stable, no new changes  All other systems were reviewed with the patient and are negative.  I have reviewed the past medical history, past surgical history, social history and family history with the patient and they are unchanged from previous note.  ALLERGIES:  has No Known Allergies.  MEDICATIONS:  Current Outpatient Medications  Medication Sig Dispense Refill  . Cholecalciferol (VITAMIN D) 2000 units CAPS Take 1 capsule by mouth daily.    Marland Kitchen ezetimibe (ZETIA) 10 MG tablet Take 10 mg by mouth daily.    Marland Kitchen lidocaine-prilocaine (EMLA) cream Apply to affected area once 30 g 3  . morphine (MSIR) 15  MG tablet Take 1 tablet (15 mg total) by mouth every 4 (four) hours as needed for severe pain. 30 tablet 0  . ondansetron (ZOFRAN) 8 MG tablet Take 1 tablet (8 mg total) by mouth 2 (two) times daily as needed (Nausea or vomiting). 30 tablet 1  . prochlorperazine (COMPAZINE) 10 MG tablet Take 1 tablet (10 mg total) by mouth every 6 (six) hours as needed (Nausea or vomiting). 30 tablet 1   No current facility-administered medications for this visit.    PHYSICAL EXAMINATION: ECOG PERFORMANCE STATUS: 1 - Symptomatic but completely ambulatory  Vitals:   10/21/19 1326  BP: 134/69  Pulse:  74  Resp: 18  Temp: 100 F (37.8 C)  SpO2: 100%   Filed Weights   10/21/19 1326  Weight: 145 lb 9.6 oz (66 kg)    GENERAL:alert, no distress and comfortable SKIN: skin color, texture, turgor are normal, no rashes or significant lesions EYES: normal, Conjunctiva are pink and non-injected, sclera clear OROPHARYNX:no exudate, no erythema and lips, buccal mucosa, and tongue normal  NECK: supple, thyroid normal size, non-tender, without nodularity LYMPH:  no palpable lymphadenopathy in the cervical, axillary or inguinal LUNGS: clear to auscultation and percussion with normal breathing effort HEART: regular rate & rhythm and no murmurs and no lower extremity edema ABDOMEN:abdomen soft, non-tender and normal bowel sounds Musculoskeletal:no cyanosis of digits and no clubbing  NEURO: alert & oriented x 3 with fluent speech, no focal motor/sensory deficits  LABORATORY DATA:  I have reviewed the data as listed    Component Value Date/Time   NA 137 10/21/2019 1314   NA 139 06/07/2017 1430   K 4.5 10/21/2019 1314   K 3.8 06/07/2017 1430   CL 103 10/21/2019 1314   CO2 26 10/21/2019 1314   CO2 27 06/07/2017 1430   GLUCOSE 108 (H) 10/21/2019 1314   GLUCOSE 124 06/07/2017 1430   BUN 13 10/21/2019 1314   BUN 14.0 06/07/2017 1430   CREATININE 0.68 10/21/2019 1314   CREATININE 0.8 06/07/2017 1430   CALCIUM 9.2 10/21/2019 1314   CALCIUM 9.3 06/07/2017 1430   PROT 6.7 10/21/2019 1314   PROT 6.6 09/30/2016 1027   ALBUMIN 3.6 10/21/2019 1314   ALBUMIN 3.5 09/30/2016 1027   AST 44 (H) 10/21/2019 1314   AST 18 09/30/2016 1027   ALT 92 (H) 10/21/2019 1314   ALT 21 09/30/2016 1027   ALKPHOS 70 10/21/2019 1314   ALKPHOS 77 09/30/2016 1027   BILITOT 0.2 (L) 10/21/2019 1314   BILITOT 0.37 09/30/2016 1027   GFRNONAA >60 10/21/2019 1314   GFRAA >60 10/21/2019 1314    No results found for: SPEP, UPEP  Lab Results  Component Value Date   WBC 3.1 (L) 10/21/2019   NEUTROABS 1.3 (L)  10/21/2019   HGB 10.4 (L) 10/21/2019   HCT 31.5 (L) 10/21/2019   MCV 93.5 10/21/2019   PLT 293 10/21/2019      Chemistry      Component Value Date/Time   NA 137 10/21/2019 1314   NA 139 06/07/2017 1430   K 4.5 10/21/2019 1314   K 3.8 06/07/2017 1430   CL 103 10/21/2019 1314   CO2 26 10/21/2019 1314   CO2 27 06/07/2017 1430   BUN 13 10/21/2019 1314   BUN 14.0 06/07/2017 1430   CREATININE 0.68 10/21/2019 1314   CREATININE 0.8 06/07/2017 1430      Component Value Date/Time   CALCIUM 9.2 10/21/2019 1314   CALCIUM 9.3 06/07/2017 1430   ALKPHOS  70 10/21/2019 1314   ALKPHOS 77 09/30/2016 1027   AST 44 (H) 10/21/2019 1314   AST 18 09/30/2016 1027   ALT 92 (H) 10/21/2019 1314   ALT 21 09/30/2016 1027   BILITOT 0.2 (L) 10/21/2019 1314   BILITOT 0.37 09/30/2016 1027       All questions were answered. The patient knows to call the clinic with any problems, questions or concerns. No barriers to learning was detected.  I spent 15 minutes counseling the patient face to face. The total time spent in the appointment was 20 minutes and more than 50% was on counseling and review of test results  [name], MD 10/21/2019 1:56 PM

## 2019-10-21 NOTE — Assessment & Plan Note (Signed)
She has no recent pain I suspect she have good response to treatment She had pain medicine to take as needed

## 2019-10-21 NOTE — Assessment & Plan Note (Signed)
She denies worsening neuropathy while on treatment

## 2019-10-21 NOTE — Assessment & Plan Note (Signed)
Clinically, she had positive response to treatment Her pain went away and she have decline in tumor marker monitoring Recommend we proceed with treatment as scheduled this month I plan to repeat CT imaging next month for objective assessment of response to therapy

## 2019-10-22 LAB — CA 125: Cancer Antigen (CA) 125: 34.8 U/mL (ref 0.0–38.1)

## 2019-10-23 ENCOUNTER — Telehealth: Payer: Self-pay | Admitting: Hematology and Oncology

## 2019-10-23 NOTE — Telephone Encounter (Signed)
Scheduled appt per 1/4 sch message -  Unable to reach pt - left message with appt dates and times

## 2019-10-28 ENCOUNTER — Other Ambulatory Visit: Payer: Medicare Other

## 2019-11-04 ENCOUNTER — Inpatient Hospital Stay: Payer: Medicare Other

## 2019-11-04 ENCOUNTER — Other Ambulatory Visit: Payer: Self-pay

## 2019-11-04 VITALS — BP 140/72 | HR 72 | Temp 98.1°F | Resp 16 | Wt 143.8 lb

## 2019-11-04 DIAGNOSIS — C5702 Malignant neoplasm of left fallopian tube: Secondary | ICD-10-CM

## 2019-11-04 DIAGNOSIS — Z7189 Other specified counseling: Secondary | ICD-10-CM

## 2019-11-04 DIAGNOSIS — Z5111 Encounter for antineoplastic chemotherapy: Secondary | ICD-10-CM | POA: Diagnosis not present

## 2019-11-04 DIAGNOSIS — Z95828 Presence of other vascular implants and grafts: Secondary | ICD-10-CM

## 2019-11-04 LAB — CBC WITH DIFFERENTIAL (CANCER CENTER ONLY)
Abs Immature Granulocytes: 0.02 10*3/uL (ref 0.00–0.07)
Basophils Absolute: 0 10*3/uL (ref 0.0–0.1)
Basophils Relative: 1 %
Eosinophils Absolute: 0.2 10*3/uL (ref 0.0–0.5)
Eosinophils Relative: 5 %
HCT: 34.7 % — ABNORMAL LOW (ref 36.0–46.0)
Hemoglobin: 11.2 g/dL — ABNORMAL LOW (ref 12.0–15.0)
Immature Granulocytes: 0 %
Lymphocytes Relative: 21 %
Lymphs Abs: 1 10*3/uL (ref 0.7–4.0)
MCH: 31.2 pg (ref 26.0–34.0)
MCHC: 32.3 g/dL (ref 30.0–36.0)
MCV: 96.7 fL (ref 80.0–100.0)
Monocytes Absolute: 0.8 10*3/uL (ref 0.1–1.0)
Monocytes Relative: 16 %
Neutro Abs: 2.7 10*3/uL (ref 1.7–7.7)
Neutrophils Relative %: 57 %
Platelet Count: 314 10*3/uL (ref 150–400)
RBC: 3.59 MIL/uL — ABNORMAL LOW (ref 3.87–5.11)
RDW: 16.7 % — ABNORMAL HIGH (ref 11.5–15.5)
WBC Count: 4.7 10*3/uL (ref 4.0–10.5)
nRBC: 0 % (ref 0.0–0.2)

## 2019-11-04 LAB — CMP (CANCER CENTER ONLY)
ALT: 25 U/L (ref 0–44)
AST: 17 U/L (ref 15–41)
Albumin: 3.5 g/dL (ref 3.5–5.0)
Alkaline Phosphatase: 68 U/L (ref 38–126)
Anion gap: 8 (ref 5–15)
BUN: 11 mg/dL (ref 8–23)
CO2: 24 mmol/L (ref 22–32)
Calcium: 8.6 mg/dL — ABNORMAL LOW (ref 8.9–10.3)
Chloride: 107 mmol/L (ref 98–111)
Creatinine: 0.74 mg/dL (ref 0.44–1.00)
GFR, Est AFR Am: >60 mL/min (ref >60)
GFR, Estimated: >60 mL/min (ref >60)
Glucose, Bld: 91 mg/dL (ref 70–99)
Potassium: 4.8 mmol/L (ref 3.5–5.1)
Sodium: 139 mmol/L (ref 135–145)
Total Bilirubin: <0.2 mg/dL — ABNORMAL LOW (ref 0.3–1.2)
Total Protein: 6.6 g/dL (ref 6.5–8.1)

## 2019-11-04 MED ORDER — SODIUM CHLORIDE 0.9 % IV SOLN
600.0000 mg/m2 | Freq: Once | INTRAVENOUS | Status: AC
  Administered 2019-11-04: 1026 mg via INTRAVENOUS
  Filled 2019-11-04: qty 26.98

## 2019-11-04 MED ORDER — HEPARIN SOD (PORK) LOCK FLUSH 100 UNIT/ML IV SOLN
500.0000 [IU] | Freq: Once | INTRAVENOUS | Status: AC | PRN
  Administered 2019-11-04: 500 [IU]
  Filled 2019-11-04: qty 5

## 2019-11-04 MED ORDER — SODIUM CHLORIDE 0.9% FLUSH
10.0000 mL | Freq: Once | INTRAVENOUS | Status: AC
  Administered 2019-11-04: 10 mL
  Filled 2019-11-04: qty 10

## 2019-11-04 MED ORDER — ALTEPLASE 2 MG IJ SOLR
2.0000 mg | Freq: Once | INTRAMUSCULAR | Status: DC
  Filled 2019-11-04: qty 2

## 2019-11-04 MED ORDER — PROCHLORPERAZINE MALEATE 10 MG PO TABS
ORAL_TABLET | ORAL | Status: AC
  Filled 2019-11-04: qty 1

## 2019-11-04 MED ORDER — SODIUM CHLORIDE 0.9 % IV SOLN
Freq: Once | INTRAVENOUS | Status: AC
  Filled 2019-11-04: qty 250

## 2019-11-04 MED ORDER — PROCHLORPERAZINE MALEATE 10 MG PO TABS
10.0000 mg | ORAL_TABLET | Freq: Once | ORAL | Status: AC
  Administered 2019-11-04: 09:00:00 10 mg via ORAL

## 2019-11-04 MED ORDER — SODIUM CHLORIDE 0.9% FLUSH
10.0000 mL | INTRAVENOUS | Status: DC | PRN
  Administered 2019-11-04: 10 mL
  Filled 2019-11-04: qty 10

## 2019-11-04 MED ORDER — ALTEPLASE 2 MG IJ SOLR
INTRAMUSCULAR | Status: AC
  Filled 2019-11-04: qty 2

## 2019-11-04 NOTE — Patient Instructions (Signed)

## 2019-11-04 NOTE — Patient Instructions (Signed)
[facility] Discharge Instructions for Patients Receiving Chemotherapy  Today you received the following chemotherapy agents: gemzar   To help prevent nausea and vomiting after your treatment, we encourage you to take your nausea medication as directed   If you develop nausea and vomiting that is not controlled by your nausea medication, call the clinic.   BELOW ARE SYMPTOMS THAT SHOULD BE REPORTED IMMEDIATELY:  *FEVER GREATER THAN 100.5 F  *CHILLS WITH OR WITHOUT FEVER  NAUSEA AND VOMITING THAT IS NOT CONTROLLED WITH YOUR NAUSEA MEDICATION  *UNUSUAL SHORTNESS OF BREATH  *UNUSUAL BRUISING OR BLEEDING  TENDERNESS IN MOUTH AND THROAT WITH OR WITHOUT PRESENCE OF ULCERS  *URINARY PROBLEMS  *BOWEL PROBLEMS  UNUSUAL RASH Items with * indicate a potential emergency and should be followed up as soon as possible.  Feel free to call the clinic you have any questions or concerns. The clinic phone number is [phone].  Gemcitabine injection What is this medicine? GEMCITABINE (jem SYE ta been) is a chemotherapy drug. This medicine is used to treat many types of cancer like breast cancer, lung cancer, pancreatic cancer, and ovarian cancer. This medicine may be used for other purposes; ask your health care provider or pharmacist if you have questions. COMMON BRAND NAME(S): Gemzar, Infugem What should I tell my health care provider before I take this medicine? They need to know if you have any of these conditions:  blood disorders  infection  kidney disease  liver disease  lung or breathing disease, like asthma  recent or ongoing radiation therapy  an unusual or allergic reaction to gemcitabine, other chemotherapy, other medicines, foods, dyes, or preservatives  pregnant or trying to get pregnant  breast-feeding How should I use this medicine? This drug is given as an infusion into a vein. It is administered in a hospital or clinic by a specially trained  health care professional. Talk to your pediatrician regarding the use of this medicine in children. Special care may be needed. Overdosage: If you think you have taken too much of this medicine contact a poison control center or emergency room at once. NOTE: This medicine is only for you. Do not share this medicine with others. What if I miss a dose? It is important not to miss your dose. Call your doctor or health care professional if you are unable to keep an appointment. What may interact with this medicine?  medicines to increase blood counts like filgrastim, pegfilgrastim, sargramostim  some other chemotherapy drugs like cisplatin  vaccines Talk to your doctor or health care professional before taking any of these medicines:  acetaminophen  aspirin  ibuprofen  ketoprofen  naproxen This list may not describe all possible interactions. Give your health care provider a list of all the medicines, herbs, non-prescription drugs, or dietary supplements you use. Also tell them if you smoke, drink alcohol, or use illegal drugs. Some items may interact with your medicine. What should I watch for while using this medicine? Visit your doctor for checks on your progress. This drug may make you feel generally unwell. This is not uncommon, as chemotherapy can affect healthy cells as well as cancer cells. Report any side effects. Continue your course of treatment even though you feel ill unless your doctor tells you to stop. In some cases, you may be given additional medicines to help with side effects. Follow all directions for their use. Call your doctor or health care professional for advice if you get a fever, chills or sore  throat, or other symptoms of a cold or flu. Do not treat yourself. This drug decreases your body's ability to fight infections. Try to avoid being around people who are sick. This medicine may increase your risk to bruise or bleed. Call your doctor or health care  professional if you notice any unusual bleeding. Be careful brushing and flossing your teeth or using a toothpick because you may get an infection or bleed more easily. If you have any dental work done, tell your dentist you are receiving this medicine. Avoid taking products that contain aspirin, acetaminophen, ibuprofen, naproxen, or ketoprofen unless instructed by your doctor. These medicines may hide a fever. Do not become pregnant while taking this medicine or for 6 months after stopping it. Women should inform their doctor if they wish to become pregnant or think they might be pregnant. Men should not father a child while taking this medicine and for 3 months after stopping it. There is a potential for serious side effects to an unborn child. Talk to your health care professional or pharmacist for more information. Do not breast-feed an infant while taking this medicine or for at least 1 week after stopping it. Men should inform their doctors if they wish to father a child. This medicine may lower sperm counts. Talk with your doctor or health care professional if you are concerned about your fertility. What side effects may I notice from receiving this medicine? Side effects that you should report to your doctor or health care professional as soon as possible:  allergic reactions like skin rash, itching or hives, swelling of the face, lips, or tongue  breathing problems  pain, redness, or irritation at site where injected  signs and symptoms of a dangerous change in heartbeat or heart rhythm like chest pain; dizziness; fast or irregular heartbeat; palpitations; feeling faint or lightheaded, falls; breathing problems  signs of decreased platelets or bleeding - bruising, pinpoint red spots on the skin, black, tarry stools, blood in the urine  signs of decreased red blood cells - unusually weak or tired, feeling faint or lightheaded, falls  signs of infection - fever or chills, cough, sore  throat, pain or difficulty passing urine  signs and symptoms of kidney injury like trouble passing urine or change in the amount of urine  signs and symptoms of liver injury like dark yellow or brown urine; general ill feeling or flu-like symptoms; light-colored stools; loss of appetite; nausea; right upper belly pain; unusually weak or tired; yellowing of the eyes or skin  swelling of ankles, feet, hands Side effects that usually do not require medical attention (report to your doctor or health care professional if they continue or are bothersome):  constipation  diarrhea  hair loss  loss of appetite  nausea  rash  vomiting This list may not describe all possible side effects. Call your doctor for medical advice about side effects. You may report side effects to FDA at 1-800-FDA-1088. Where should I keep my medicine? This drug is given in a hospital or clinic and will not be stored at home. NOTE: This sheet is a summary. It may not cover all possible information. If you have questions about this medicine, talk to your doctor, pharmacist, or health care provider.  2020 [employer] Standard (2017-12-27 18:06:11)

## 2019-11-11 ENCOUNTER — Inpatient Hospital Stay: Payer: Medicare Other

## 2019-11-11 ENCOUNTER — Other Ambulatory Visit: Payer: Self-pay

## 2019-11-11 VITALS — BP 130/74 | HR 85 | Temp 98.3°F | Resp 16 | Ht 64.0 in | Wt 144.8 lb

## 2019-11-11 DIAGNOSIS — C5702 Malignant neoplasm of left fallopian tube: Secondary | ICD-10-CM

## 2019-11-11 DIAGNOSIS — Z7189 Other specified counseling: Secondary | ICD-10-CM

## 2019-11-11 DIAGNOSIS — Z95828 Presence of other vascular implants and grafts: Secondary | ICD-10-CM

## 2019-11-11 DIAGNOSIS — Z5111 Encounter for antineoplastic chemotherapy: Secondary | ICD-10-CM | POA: Diagnosis not present

## 2019-11-11 LAB — CMP (CANCER CENTER ONLY)
ALT: 25 U/L (ref 0–44)
AST: 19 U/L (ref 15–41)
Albumin: 3.6 g/dL (ref 3.5–5.0)
Alkaline Phosphatase: 70 U/L (ref 38–126)
Anion gap: 9 (ref 5–15)
BUN: 12 mg/dL (ref 8–23)
CO2: 25 mmol/L (ref 22–32)
Calcium: 8.9 mg/dL (ref 8.9–10.3)
Chloride: 103 mmol/L (ref 98–111)
Creatinine: 0.76 mg/dL (ref 0.44–1.00)
GFR, Est AFR Am: >60 mL/min (ref >60)
GFR, Estimated: >60 mL/min (ref >60)
Glucose, Bld: 148 mg/dL — ABNORMAL HIGH (ref 70–99)
Potassium: 4.3 mmol/L (ref 3.5–5.1)
Sodium: 137 mmol/L (ref 135–145)
Total Bilirubin: <0.2 mg/dL — ABNORMAL LOW (ref 0.3–1.2)
Total Protein: 6.8 g/dL (ref 6.5–8.1)

## 2019-11-11 LAB — CBC WITH DIFFERENTIAL (CANCER CENTER ONLY)
Abs Immature Granulocytes: 0.01 10*3/uL (ref 0.00–0.07)
Basophils Absolute: 0 10*3/uL (ref 0.0–0.1)
Basophils Relative: 1 %
Eosinophils Absolute: 0.1 10*3/uL (ref 0.0–0.5)
Eosinophils Relative: 2 %
HCT: 32 % — ABNORMAL LOW (ref 36.0–46.0)
Hemoglobin: 10.5 g/dL — ABNORMAL LOW (ref 12.0–15.0)
Immature Granulocytes: 0 %
Lymphocytes Relative: 39 %
Lymphs Abs: 1.1 10*3/uL (ref 0.7–4.0)
MCH: 31.2 pg (ref 26.0–34.0)
MCHC: 32.8 g/dL (ref 30.0–36.0)
MCV: 95 fL (ref 80.0–100.0)
Monocytes Absolute: 0.5 10*3/uL (ref 0.1–1.0)
Monocytes Relative: 18 %
Neutro Abs: 1.1 10*3/uL — ABNORMAL LOW (ref 1.7–7.7)
Neutrophils Relative %: 40 %
Platelet Count: 324 10*3/uL (ref 150–400)
RBC: 3.37 MIL/uL — ABNORMAL LOW (ref 3.87–5.11)
RDW: 16.1 % — ABNORMAL HIGH (ref 11.5–15.5)
WBC Count: 2.8 10*3/uL — ABNORMAL LOW (ref 4.0–10.5)
nRBC: 0 % (ref 0.0–0.2)

## 2019-11-11 MED ORDER — SODIUM CHLORIDE 0.9% FLUSH
10.0000 mL | Freq: Once | INTRAVENOUS | Status: AC
  Administered 2019-11-11: 10 mL
  Filled 2019-11-11: qty 10

## 2019-11-11 MED ORDER — HEPARIN SOD (PORK) LOCK FLUSH 100 UNIT/ML IV SOLN
500.0000 [IU] | Freq: Once | INTRAVENOUS | Status: AC | PRN
  Administered 2019-11-11: 500 [IU]
  Filled 2019-11-11: qty 5

## 2019-11-11 MED ORDER — PROCHLORPERAZINE MALEATE 10 MG PO TABS
10.0000 mg | ORAL_TABLET | Freq: Once | ORAL | Status: AC
  Administered 2019-11-11: 10 mg via ORAL

## 2019-11-11 MED ORDER — SODIUM CHLORIDE 0.9 % IV SOLN
600.0000 mg/m2 | Freq: Once | INTRAVENOUS | Status: AC
  Administered 2019-11-11: 1026 mg via INTRAVENOUS
  Filled 2019-11-11: qty 26.98

## 2019-11-11 MED ORDER — PROCHLORPERAZINE MALEATE 10 MG PO TABS
ORAL_TABLET | ORAL | Status: AC
  Filled 2019-11-11: qty 1

## 2019-11-11 MED ORDER — SODIUM CHLORIDE 0.9% FLUSH
10.0000 mL | INTRAVENOUS | Status: DC | PRN
  Administered 2019-11-11: 10 mL
  Filled 2019-11-11: qty 10

## 2019-11-11 MED ORDER — SODIUM CHLORIDE 0.9 % IV SOLN
Freq: Once | INTRAVENOUS | Status: AC
  Filled 2019-11-11: qty 250

## 2019-11-11 NOTE — Patient Instructions (Signed)
[facility] Discharge Instructions for Patients Receiving Chemotherapy  Today you received the following chemotherapy agents:  gemcitibine (Gemzar)  To help prevent nausea and vomiting after your treatment, we encourage you to take your nausea medication as prescribed.   If you develop nausea and vomiting that is not controlled by your nausea medication, call the clinic.   BELOW ARE SYMPTOMS THAT SHOULD BE REPORTED IMMEDIATELY:  *FEVER GREATER THAN 100.5 F  *CHILLS WITH OR WITHOUT FEVER  NAUSEA AND VOMITING THAT IS NOT CONTROLLED WITH YOUR NAUSEA MEDICATION  *UNUSUAL SHORTNESS OF BREATH  *UNUSUAL BRUISING OR BLEEDING  TENDERNESS IN MOUTH AND THROAT WITH OR WITHOUT PRESENCE OF ULCERS  *URINARY PROBLEMS  *BOWEL PROBLEMS  UNUSUAL RASH Items with * indicate a potential emergency and should be followed up as soon as possible.  Feel free to call the clinic should you have any questions or concerns. The clinic phone number is [phone].  Please show the CHEMO ALERT CARD at check-in to the Emergency Department and triage nurse.   

## 2019-11-21 ENCOUNTER — Encounter

## 2019-11-22 ENCOUNTER — Ambulatory Visit (HOSPITAL_COMMUNITY)
Admission: RE | Admit: 2019-11-22 | Discharge: 2019-11-22 | Disposition: A | Discharge status: Home / Self Care | Payer: Medicare Other | Source: Ambulatory Visit | Attending: Hematology and Oncology | Admitting: Hematology and Oncology

## 2019-11-22 ENCOUNTER — Encounter (HOSPITAL_COMMUNITY): Payer: Self-pay

## 2019-11-22 ENCOUNTER — Other Ambulatory Visit: Payer: Self-pay

## 2019-11-22 DIAGNOSIS — C5702 Malignant neoplasm of left fallopian tube: Secondary | ICD-10-CM | POA: Diagnosis not present

## 2019-11-22 DIAGNOSIS — C786 Secondary malignant neoplasm of retroperitoneum and peritoneum: Secondary | ICD-10-CM | POA: Insufficient documentation

## 2019-11-22 DIAGNOSIS — C5700 Malignant neoplasm of unspecified fallopian tube: Secondary | ICD-10-CM | POA: Insufficient documentation

## 2019-11-22 DIAGNOSIS — C562 Malignant neoplasm of left ovary: Secondary | ICD-10-CM | POA: Diagnosis not present

## 2019-11-22 DIAGNOSIS — K429 Umbilical hernia without obstruction or gangrene: Secondary | ICD-10-CM | POA: Diagnosis not present

## 2019-11-22 MED ORDER — VENLAFAXINE SR 37.5 MG 24 HR CAP
37.5 mg | ORAL_CAPSULE | Freq: Every day | ORAL | 0 refills | Status: AC
Start: 2019-11-22 — End: 2019-12-06

## 2019-11-22 MED ORDER — IOHEXOL 350 MG/ML SOLN
100.0000 mL | Freq: Once | INTRAVENOUS | Status: AC | PRN
  Administered 2019-11-22: 100 mL via INTRAVENOUS

## 2019-11-22 MED ORDER — SODIUM CHLORIDE (PF) 0.9 % IJ SOLN
INTRAMUSCULAR | Status: AC
  Filled 2019-11-22: qty 50

## 2019-11-22 NOTE — Telephone Encounter (Signed)
From: [name]  To: Joyce Gross, MD  Sent: 11/21/2019 2:55 PM EST  Subject: Prescription Question    I'm hoping Dawn Green can take care of this for me. I have been on Effexor since I had my lumpectomy and have been taking it regularly. For a while now I notice in the morning that I feel like I'm in a fog and fall back asleep again. I'm wondering if I can get off this med, perhaps lowering the dose from 75 mg to 37.5 to see how I feel on that smaller dose. In time I would like to be off the med permanently. I would appreciate any suggestions.   Thank you so much!  Dawn Green  [birth year] 2041  [phone]

## 2019-11-25 ENCOUNTER — Encounter: Payer: Self-pay | Admitting: Hematology and Oncology

## 2019-11-25 ENCOUNTER — Inpatient Hospital Stay: Payer: Medicare Other

## 2019-11-25 ENCOUNTER — Inpatient Hospital Stay: Payer: Medicare Other | Attending: Gynecologic Oncology

## 2019-11-25 ENCOUNTER — Inpatient Hospital Stay (HOSPITAL_BASED_OUTPATIENT_CLINIC_OR_DEPARTMENT_OTHER): Payer: Medicare Other | Admitting: Hematology and Oncology

## 2019-11-25 ENCOUNTER — Other Ambulatory Visit: Payer: Self-pay

## 2019-11-25 DIAGNOSIS — Z85820 Personal history of malignant melanoma of skin: Secondary | ICD-10-CM | POA: Diagnosis not present

## 2019-11-25 DIAGNOSIS — C5700 Malignant neoplasm of unspecified fallopian tube: Secondary | ICD-10-CM

## 2019-11-25 DIAGNOSIS — Z95828 Presence of other vascular implants and grafts: Secondary | ICD-10-CM

## 2019-11-25 DIAGNOSIS — Z7189 Other specified counseling: Secondary | ICD-10-CM | POA: Diagnosis not present

## 2019-11-25 DIAGNOSIS — C5702 Malignant neoplasm of left fallopian tube: Secondary | ICD-10-CM | POA: Insufficient documentation

## 2019-11-25 DIAGNOSIS — D61818 Other pancytopenia: Secondary | ICD-10-CM | POA: Diagnosis not present

## 2019-11-25 DIAGNOSIS — Z5111 Encounter for antineoplastic chemotherapy: Secondary | ICD-10-CM | POA: Diagnosis not present

## 2019-11-25 LAB — CBC WITH DIFFERENTIAL (CANCER CENTER ONLY)
Abs Immature Granulocytes: 0.01 10*3/uL (ref 0.00–0.07)
Basophils Absolute: 0 10*3/uL (ref 0.0–0.1)
Basophils Relative: 1 %
Eosinophils Absolute: 0.3 10*3/uL (ref 0.0–0.5)
Eosinophils Relative: 6 %
HCT: 33.2 % — ABNORMAL LOW (ref 36.0–46.0)
Hemoglobin: 10.9 g/dL — ABNORMAL LOW (ref 12.0–15.0)
Immature Granulocytes: 0 %
Lymphocytes Relative: 17 %
Lymphs Abs: 0.8 10*3/uL (ref 0.7–4.0)
MCH: 31 pg (ref 26.0–34.0)
MCHC: 32.8 g/dL (ref 30.0–36.0)
MCV: 94.3 fL (ref 80.0–100.0)
Monocytes Absolute: 0.8 10*3/uL (ref 0.1–1.0)
Monocytes Relative: 17 %
Neutro Abs: 2.8 10*3/uL (ref 1.7–7.7)
Neutrophils Relative %: 59 %
Platelet Count: 255 10*3/uL (ref 150–400)
RBC: 3.52 MIL/uL — ABNORMAL LOW (ref 3.87–5.11)
RDW: 16.5 % — ABNORMAL HIGH (ref 11.5–15.5)
WBC Count: 4.8 10*3/uL (ref 4.0–10.5)
nRBC: 0 % (ref 0.0–0.2)

## 2019-11-25 LAB — CMP (CANCER CENTER ONLY)
ALT: 30 U/L (ref 0–44)
AST: 20 U/L (ref 15–41)
Albumin: 3.5 g/dL (ref 3.5–5.0)
Alkaline Phosphatase: 70 U/L (ref 38–126)
Anion gap: 9 (ref 5–15)
BUN: 9 mg/dL (ref 8–23)
CO2: 25 mmol/L (ref 22–32)
Calcium: 8.7 mg/dL — ABNORMAL LOW (ref 8.9–10.3)
Chloride: 106 mmol/L (ref 98–111)
Creatinine: 0.74 mg/dL (ref 0.44–1.00)
GFR, Est AFR Am: >60 mL/min (ref >60)
GFR, Estimated: >60 mL/min (ref >60)
Glucose, Bld: 99 mg/dL (ref 70–99)
Potassium: 4.7 mmol/L (ref 3.5–5.1)
Sodium: 140 mmol/L (ref 135–145)
Total Bilirubin: <0.2 mg/dL — ABNORMAL LOW (ref 0.3–1.2)
Total Protein: 6.5 g/dL (ref 6.5–8.1)

## 2019-11-25 MED ORDER — SODIUM CHLORIDE 0.9% FLUSH
10.0000 mL | INTRAVENOUS | Status: DC | PRN
  Administered 2019-11-25: 10 mL
  Filled 2019-11-25: qty 10

## 2019-11-25 MED ORDER — PROCHLORPERAZINE MALEATE 10 MG PO TABS
ORAL_TABLET | ORAL | Status: AC
  Filled 2019-11-25: qty 1

## 2019-11-25 MED ORDER — PROCHLORPERAZINE MALEATE 10 MG PO TABS
10.0000 mg | ORAL_TABLET | Freq: Once | ORAL | Status: AC
  Administered 2019-11-25: 10:00:00 10 mg via ORAL

## 2019-11-25 MED ORDER — SODIUM CHLORIDE 0.9 % IV SOLN
Freq: Once | INTRAVENOUS | Status: AC
  Filled 2019-11-25: qty 250

## 2019-11-25 MED ORDER — HEPARIN SOD (PORK) LOCK FLUSH 100 UNIT/ML IV SOLN
500.0000 [IU] | Freq: Once | INTRAVENOUS | Status: AC | PRN
  Administered 2019-11-25: 11:00:00 500 [IU]
  Filled 2019-11-25: qty 5

## 2019-11-25 MED ORDER — SODIUM CHLORIDE 0.9 % IV SOLN
600.0000 mg/m2 | Freq: Once | INTRAVENOUS | Status: AC
  Administered 2019-11-25: 11:00:00 1026 mg via INTRAVENOUS
  Filled 2019-11-25: qty 26.98

## 2019-11-25 MED ORDER — SODIUM CHLORIDE 0.9% FLUSH
10.0000 mL | Freq: Once | INTRAVENOUS | Status: AC
  Administered 2019-11-25: 10 mL
  Filled 2019-11-25: qty 10

## 2019-11-25 NOTE — Assessment & Plan Note (Signed)
I encouraged her to keep her appointment to see her dermatologist for management of skin lesions and history of basal cell carcinoma and melanoma

## 2019-11-25 NOTE — Assessment & Plan Note (Signed)
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

## 2019-11-25 NOTE — Patient Instructions (Signed)
[facility] Discharge Instructions for Patients Receiving Chemotherapy  Today you received the following chemotherapy agents: gemcitabine.  To help prevent nausea and vomiting after your treatment, we encourage you to take your nausea medication as directed.   If you develop nausea and vomiting that is not controlled by your nausea medication, call the clinic.   BELOW ARE SYMPTOMS THAT SHOULD BE REPORTED IMMEDIATELY:  *FEVER GREATER THAN 100.5 F  *CHILLS WITH OR WITHOUT FEVER  NAUSEA AND VOMITING THAT IS NOT CONTROLLED WITH YOUR NAUSEA MEDICATION  *UNUSUAL SHORTNESS OF BREATH  *UNUSUAL BRUISING OR BLEEDING  TENDERNESS IN MOUTH AND THROAT WITH OR WITHOUT PRESENCE OF ULCERS  *URINARY PROBLEMS  *BOWEL PROBLEMS  UNUSUAL RASH Items with * indicate a potential emergency and should be followed up as soon as possible.  Feel free to call the clinic should you have any questions or concerns. The clinic phone number is [phone].  Please show the CHEMO ALERT CARD at check-in to the Emergency Department and triage nurse.   

## 2019-11-25 NOTE — Progress Notes (Signed)
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 She is not symptomatic To minimize the risk of infection, I recommend changing her treatment to every other week There is no contraindication for her to proceed with Covid vaccination but I advised her to schedule her vaccine approximately 10 days after each cycle of treatment to allow bone marrow recovery  History of melanoma I encouraged her to keep her appointment to see her dermatologist for management of skin lesions and history of basal cell carcinoma and melanoma  Goals of care, counseling/discussion We discussed goals of care She understood treatment is palliative With positive response to treatment, we will continue gemcitabine indefinitely   No orders of the defined types were placed in this encounter.   All questions were answered. The patient knows to call the clinic with any problems, questions or concerns. The total time spent in the appointment was 30 minutes encounter with patients including review of chart and various tests results, discussions about plan of care and coordination of care plan   [name], MD 11/25/2019 9:52 AM  INTERVAL HISTORY: Please see below for problem oriented charting. 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 Extensive omental nodularity highly worrisome for peritoneal carcinomatosis. Late recurrence of melanoma can present as peritoneal carcinomatosis. Ovarian cancer more commonly presents in this manner. Patient does have a predominately low density right adnexal lesion measuring up to 3.5 cm, although this lesion is not typical for ovarian cancer. 2. No evidence of bowel or ureteral obstruction. 3. No other definite signs of metastatic disease. There are small indeterminate low-density hepatic lesions.   02/08/2016 Tumor Marker   Patient's tumor was tested for the following markers: CA-125 Results of the tumor marker test revealed 656.2   02/15/2016 Procedure   CT-guided core biopsy performed of omental mass just deep to the abdominal wall.   02/15/2016 Pathology Results   Omentum, biopsy, left - PAPILLARY SEROUS NEOPLASM, SEE COMMENT. Microscopic Comment There are papillary collections of largely low grade appearing cells with numerous psammoma bodies. Given the limited material it is difficult to distinguish between invasive implants of a serous borderline tumor and serous carcinoma, especially given the low grade appearance.   02/18/2016 Pathology Results   1. Omentum, resection for tumor INVASIVE IMPLANT  OF HIGH GRADE SEROUS CARCINOMA 2. Uterus +/- tubes/ovaries, neoplastic HIGH GRADE SEROUS CARCINOMA INVOLVING LEFT TUBAL  FIMBRIA SEROUS CARCINOMA WITH PREDOMINANT PSAMMOMA BODIES IMPLANT AT UTERUS SEROSA, BILATERAL FALLOPIAN TUBAL SEROSA, AND ANTERIOR PERITONEAL REFLECTION CERVIX: HISTOLOGICAL UNREMARKABLE ENDOMETRIUM: INACTIVE ENDOMETRIUM MYOMETRIUM: LEIOMYOMA LEFT OVARY: CYSTADENOFIBROMA RIGHT OVARY AND FALLOPIAN TUBE: HISTOLOGICAL UNREMARKABLE Microscopic Comment 2. ONCOLOGY TABLE - FALLOPIAN TUBE 1. Specimen, including laterality: Omentum, uterus, bilateral ovaries and fallopian tubes 2. Procedure: Hysterectomy, bilateral salpingo-oophorectomy and tumor debulking omentectomy 3. Lymph node sampling performed: No 4. Tumor site: uterus serosa, bilateral fallopian tubal serosa, peritoneal and omentum 5. Tumor location in fallopian tube: Left fallopian tubal fimbria 6. Specimen integrity (intact/ruptured/disrupted): Intact 7. Tumor size (cm): multi focal invasive omentum implant greater than 2 cm, left fallopian tube tumor 0.8 cm. 8. Histologic type: Serous carcinoma 9. Grade: 3 10. Microscopic tumor extension: uterus serosa, bilateral fallopian tubal serosa, peritoneal and omentum 11. Margins: NA 12. Lymph-Vascular invasion: identified 13. Lymph nodes: # examined: 0; # positive: NA 14. TNM: pT3c, pNx 15. FIGO Stage (based on pathologic findings, needs clinical correlation: IIIC 16. Comment: High grade serous carcinoma multifocally and extensively involves left fallopian tubal fimbria, omentum, uterus serosa, bilateral fallopian tubal serosa and peritoneal. At the left fallopian tube there are small foci of serous tubal intraepithelial carcinoma identified, so we conclude the carcinoma is fallopian tube origin   03/09/2016 Tumor Marker   Patient's tumor was tested for the following markers: CA-125 Results of the tumor marker test revealed 154.4   03/15/2016 Procedure   Technically successful right IJ power-injectable port catheter placement. Ready for routine use.   03/18/2016 - 07/13/2016 Chemotherapy    The patient had 6 cycles of carboplatin and taxol   04/14/2016 Tumor Marker   Patient's tumor was tested for the following markers: CA-125 Results of the tumor marker test revealed 36.7   04/25/2016 Genetic Testing   Patient has genetic testing done for germline mutation Results revealed patient has no mutation   04/28/2016 Tumor Marker   Patient's tumor was tested for the following markers: CA-125 Results of the tumor marker test revealed 24.6   06/21/2016 Tumor Marker   Patient's tumor was tested for the following markers: CA-125 Results of the tumor marker test revealed 16.6   08/01/2016 Tumor Marker   Patient's tumor was tested for the following markers: CA-125 Results of the tumor marker test revealed 17.2   08/05/2016 Imaging   Interval TAH-BSO. No evidence of residual pelvic mass or metastatic disease within the abdomen or pelvis. No other acute findings.    11/04/2016 Tumor Marker   Patient's tumor was tested for the following markers: CA-125 Results of the tumor marker test revealed 13.6   01/20/2017 Tumor Marker   Patient's tumor was tested for the following markers: CA-125 Results of the tumor marker test revealed 13.8   04/14/2017 Tumor Marker   Patient's tumor was tested for the following markers: CA-125 Results of the tumor marker test revealed 16.1   06/13/2017 Imaging   No acute findings.  No mass or hernia identified.  Colonic diverticulosis, without radiographic evidence of diverticulitis.  Aortic atherosclerosis.   07/03/2017 Tumor Marker   Patient's tumor was tested for the following markers: CA-125 Results of the tumor marker test revealed 18.4   09/19/2017 Tumor Marker   Patient's tumor was tested for the following markers: CA-125 Results of the tumor marker test revealed 18.5   01/23/2018 Tumor Marker   Patient's tumor was tested for the following markers: CA-125  Results of the tumor marker test revealed 35.4   01/30/2018 Imaging   1. No evidence  of local fallopian tube carcinoma recurrence within the pelvis. Post hysterectomy. 2. No evidence of metastatic peritoneal disease or omental disease. No solid organ metastasis.    03/20/2018 Tumor Marker   Patient's tumor was tested for the following markers: CA-125 Results of the tumor marker test revealed 73   05/02/2018 Tumor Marker   Patient's tumor was tested for the following markers: CA-125 Results of the tumor marker test revealed 127.1   05/08/2018 Imaging   CT abdomen and pelvis 1. 7 cm left subdiaphragmatic collection of loculated fluid versus low-density soft tissue. Given the patient's history of rising CA 125, metastatic disease considered highly likely.  2. Areas of fluid or recurrent disease identified in the right lower quadrant adjacent to the cecum and along right lower quadrant small bowel loops.   05/14/2018 Tumor Marker   Patient's tumor was tested for the following markers: CA-125 Results of the tumor marker test revealed 166    Genetic Testing   Patient has genetic testing done for ER/PR and MMR. Results revealed patient has the following on 02/18/2016 surgical pathology: ER 20%, PR 0% MMR: normal    Genetic Testing   Patient has genetic testing done for MSI. Results revealed patient has the following: MSI stable   05/18/2018 - 12/31/2018 Chemotherapy   The patient had carboplatin and Doxil   07/16/2018 Tumor Marker   Patient's tumor was tested for the following markers: CA-125 Results of the tumor marker test revealed 28.7   08/10/2018 Imaging   CT imaging:  Decreased size of peritoneal soft tissue masses in left upper quadrant, consistent with decreased metastatic disease.  No new or progressive metastatic disease. No other acute findings.  Colonic diverticulosis, without radiographic evidence of diverticulitis.  Stable small paraumbilical ventral hernia containing transverse colon.   08/13/2018 Tumor Marker   Patient's tumor was tested for the  following markers: CA-125 Results of the tumor marker test revealed 21.6   08/24/2018 Echocardiogram   LV EF: 60% -  65%   10/15/2018 Tumor Marker   Patient's tumor was tested for the following markers: CA-125 Results of the tumor marker test revealed 20.1   10/19/2018 Imaging   Ct scan of abdomen and pelvis Status post hysterectomy, bilateral salpingo-oophorectomy, and omentectomy.  Two peritoneal implants in the left upper abdomen are stable versus mildly decreased, as above. No abdominopelvic ascites.  No evidence of new/progressive metastatic disease.     11/19/2018 Tumor Marker   Patient's tumor was tested for the following markers: CA-125 Results of the tumor marker test revealed 21.7   11/23/2018 Echocardiogram   1. The left ventricle has normal systolic function of 13-24%. The cavity size was normal. There is no increased left ventricular wall thickness. Echo evidence of impaired diastolic relaxation.  2. The right ventricle has normal systolic function. The cavity was normal. There is no increase in right ventricular wall thickness.  3. The mitral valve is normal in structure. There is mild mitral annular calcification present.  4. The tricuspid valve is normal in structure.  5. The aortic valve is tricuspid There is mild thickening of the aortic valve.  6. The pulmonic valve was normal in structure. Pulmonic valve regurgitation is mild by color flow Doppler.  7. Normal LV systolic function; mild diastolic dysfunction.   12/31/2018 Tumor Marker   Patient's tumor was tested for the following markers: CA-125 Results of the tumor marker  test revealed 17.1   01/31/2019 Imaging   1. Left upper quadrant peritoneal implants described previously have decreased in the interval. No new or progressive findings in the abdomen/pelvis today. No free fluid. 2. Right paraumbilical ventral hernia contains a small knuckle of transverse colon without complicating features. 3.  Aortic  Atherosclerois (ICD10-170.0)  Aortic Atherosclerosis (ICD10-I70.0).   01/31/2019 Tumor Marker   Patient's tumor was tested for the following markers: CA-125 Results of the tumor marker test revealed 18.9   02/11/2019 - 03/13/2019 Chemotherapy   The patient is taking Niraparib. She self discontinued after 1 month   02/25/2019 Tumor Marker   Patient's tumor was tested for the following markers: CA-125 Results of the tumor marker test revealed 18.6   05/13/2019 Tumor Marker   Patient's tumor was tested for the following markers: CA-125 Results of the tumor marker test revealed 230   05/22/2019 Imaging   1. Increased size of 4.6 cm soft tissue mass in the gastrosplenic ligament, consistent with metastatic disease. 2. No other sites of metastatic disease identified within the abdomen or pelvis. 3. Colonic diverticulosis. No radiographic evidence of diverticulitis. 4. Stable small right paraumbilical hernia containing transverse colon.   Aortic Atherosclerosis (ICD10-I70.0).   05/31/2019 - 08/22/2019 Chemotherapy   The patient had bevacizumab and Taxol for chemotherapy treatment.     05/31/2019 Tumor Marker   Patient's tumor was tested for the following markers: CA-125 Results of the tumor marker test revealed 84   06/21/2019 Tumor Marker   Patient's tumor was tested for the following markers: CA-125 Results of the tumor marker test revealed 49.4.   07/19/2019 Tumor Marker   Patient's tumor was tested for the following markers: CA-125. Results of the tumor marker test revealed 44.5   08/22/2019 Imaging   1. Increase in size of 5.0 cm mass in the gastro splenic ligament consistent with metastatic disease. 2. No additional sites of disease identified within the abdomen or pelvis. 3. Unchanged paraumbilical hernia containing nonobstructed loop of large bowel. 4.  Aortic Atherosclerosis (ICD10-I70.0).     08/30/2019 Tumor Marker   Patient's tumor was tested for the following markers:  CA-125 Results of the tumor marker test revealed 71.5   08/30/2019 -  Chemotherapy   The patient had gemzar for chemotherapy treatment.     09/27/2019 Tumor Marker   Patient's tumor was tested for the following markers: CA-125. Results of the tumor marker test revealed 38.5   10/14/2019 Tumor Marker   Patient's tumor was tested for the following markers: CA-125 Results of the tumor marker test revealed 40.8   10/21/2019 Tumor Marker   Patient's tumor was tested for the following markers: CA-125 Results of the tumor marker test revealed 34.8.   11/22/2019 Imaging   1. Interval decrease in size of left upper quadrant cystic and solid peritoneal lesion. 2. No new sites of disease. 3. Unchanged periumbilical hernia containing a nonobstructed loop of small bowel. 4.  Aortic Atherosclerosis (ICD10-I70.0).       REVIEW OF SYSTEMS:   Constitutional: Denies fevers, chills or abnormal weight loss Eyes: Denies blurriness of vision Ears, nose, mouth, throat, and face: Denies mucositis or sore throat Respiratory: Denies cough, dyspnea or wheezes Cardiovascular: Denies palpitation, chest discomfort or lower extremity swelling Gastrointestinal:  Denies nausea, heartburn or change in bowel habits Skin: Denies abnormal skin rashes Lymphatics: Denies new lymphadenopathy or easy bruising Neurological:Denies numbness, tingling or new weaknesses Behavioral/Psych: Mood is stable, no new changes  All other systems were reviewed with the  patient and are negative.  I have reviewed the past medical history, past surgical history, social history and family history with the patient and they are unchanged from previous note.  ALLERGIES:  has No Known Allergies.  MEDICATIONS:  Current Outpatient Medications  Medication Sig Dispense Refill  . Cholecalciferol (VITAMIN D) 2000 units CAPS Take 1 capsule by mouth daily.    Marland Kitchen ezetimibe (ZETIA) 10 MG tablet Take 10 mg by mouth daily.    Marland Kitchen  lidocaine-prilocaine (EMLA) cream Apply to affected area once 30 g 3  . morphine (MSIR) 15 MG tablet Take 1 tablet (15 mg total) by mouth every 4 (four) hours as needed for severe pain. 30 tablet 0  . ondansetron (ZOFRAN) 8 MG tablet Take 1 tablet (8 mg total) by mouth 2 (two) times daily as needed (Nausea or vomiting). 30 tablet 1  . prochlorperazine (COMPAZINE) 10 MG tablet Take 1 tablet (10 mg total) by mouth every 6 (six) hours as needed (Nausea or vomiting). 30 tablet 1   No current facility-administered medications for this visit.   Facility-Administered Medications Ordered in Other Visits  Medication Dose Route Frequency Provider Last Rate Last Admin  . 0.9 %  sodium chloride infusion   Intravenous Once Alvy Bimler, Tarini Carrier, MD      . gemcitabine (GEMZAR) 1,026 mg in sodium chloride 0.9 % 250 mL chemo infusion  600 mg/m2 (Treatment Plan Recorded) Intravenous Once Alvy Bimler, Kace Hartje, MD      . heparin lock flush 100 unit/mL  500 Units Intracatheter Once PRN Alvy Bimler, Kameelah Minish, MD      . prochlorperazine (COMPAZINE) tablet 10 mg  10 mg Oral Once Alvy Bimler, Sonna Lipsky, MD      . sodium chloride flush (NS) 0.9 % injection 10 mL  10 mL Intracatheter PRN Alvy Bimler, Lenee Franze, MD        PHYSICAL EXAMINATION: ECOG PERFORMANCE STATUS: 0 - Asymptomatic  Vitals:   11/25/19 0911  BP: 115/60  Pulse: 66  Resp: 18  Temp: 97.9 F (36.6 C)  SpO2: 100%   Filed Weights   11/25/19 0911  Weight: 144 lb 9.6 oz (65.6 kg)    GENERAL:alert, no distress and comfortable Musculoskeletal:no cyanosis of digits and no clubbing  NEURO: alert & oriented x 3 with fluent speech, no focal motor/sensory deficits  LABORATORY DATA:  I have reviewed the data as listed    Component Value Date/Time   NA 140 11/25/2019 0856   NA 139 06/07/2017 1430   K 4.7 11/25/2019 0856   K 3.8 06/07/2017 1430   CL 106 11/25/2019 0856   CO2 25 11/25/2019 0856   CO2 27 06/07/2017 1430   GLUCOSE 99 11/25/2019 0856   GLUCOSE 124 06/07/2017 1430   BUN 9  11/25/2019 0856   BUN 14.0 06/07/2017 1430   CREATININE 0.74 11/25/2019 0856   CREATININE 0.8 06/07/2017 1430   CALCIUM 8.7 (L) 11/25/2019 0856   CALCIUM 9.3 06/07/2017 1430   PROT 6.5 11/25/2019 0856   PROT 6.6 09/30/2016 1027   ALBUMIN 3.5 11/25/2019 0856   ALBUMIN 3.5 09/30/2016 1027   AST 20 11/25/2019 0856   AST 18 09/30/2016 1027   ALT 30 11/25/2019 0856   ALT 21 09/30/2016 1027   ALKPHOS 70 11/25/2019 0856   ALKPHOS 77 09/30/2016 1027   BILITOT <0.2 (L) 11/25/2019 0856   BILITOT 0.37 09/30/2016 1027   GFRNONAA >60 11/25/2019 0856   GFRAA >60 11/25/2019 0856    No results found for: SPEP, UPEP  Lab Results  Component  Value Date   WBC 4.8 11/25/2019   NEUTROABS 2.8 11/25/2019   HGB 10.9 (L) 11/25/2019   HCT 33.2 (L) 11/25/2019   MCV 94.3 11/25/2019   PLT 255 11/25/2019      Chemistry      Component Value Date/Time   NA 140 11/25/2019 0856   NA 139 06/07/2017 1430   K 4.7 11/25/2019 0856   K 3.8 06/07/2017 1430   CL 106 11/25/2019 0856   CO2 25 11/25/2019 0856   CO2 27 06/07/2017 1430   BUN 9 11/25/2019 0856   BUN 14.0 06/07/2017 1430   CREATININE 0.74 11/25/2019 0856   CREATININE 0.8 06/07/2017 1430      Component Value Date/Time   CALCIUM 8.7 (L) 11/25/2019 0856   CALCIUM 9.3 06/07/2017 1430   ALKPHOS 70 11/25/2019 0856   ALKPHOS 77 09/30/2016 1027   AST 20 11/25/2019 0856   AST 18 09/30/2016 1027   ALT 30 11/25/2019 0856   ALT 21 09/30/2016 1027   BILITOT <0.2 (L) 11/25/2019 0856   BILITOT 0.37 09/30/2016 1027       RADIOGRAPHIC STUDIES: I have reviewed multiple imaging studies with the patient I have personally reviewed the radiological images as listed and agreed with the findings in the report. CT Abdomen Pelvis W Contrast  Result Date: 11/22/2019 CLINICAL DATA:  Follow-up ovarian cancer. EXAM: CT ABDOMEN AND PELVIS WITH CONTRAST TECHNIQUE: Multidetector CT imaging of the abdomen and pelvis was performed using the standard protocol  following bolus administration of intravenous contrast. CONTRAST:  118m OMNIPAQUE IOHEXOL 350 MG/ML SOLN COMPARISON:  08/22/2019 FINDINGS: Lower chest: The lung bases appear clear Hepatobiliary: No suspicious liver abnormality. Gallbladder normal. No biliary dilatation identified. Pancreas: Unremarkable. No pancreatic ductal dilatation or surrounding inflammatory changes. Spleen: Normal in size without focal abnormality. Adrenals/Urinary Tract: Adrenal glands are unremarkable. Kidneys are normal, without renal calculi, focal lesion, or hydronephrosis. Bladder is unremarkable. Stomach/Bowel: Stomach is within normal limits. Appendix appears normal. No evidence of bowel wall thickening, distention, or inflammatory changes. Periumbilical hernia is identified containing a nonobstructed loop of large bowel. Vascular/Lymphatic: Aortic atherosclerosis. No enlarged abdominal or pelvic lymph nodes. Reproductive: Hysterectomy.  No adnexal mass Other: Left upper quadrant cystic lesion measures 3.8 x 3.4 cm, image 16/2. Previously 5.0 x 4.5 cm. No new or progressive disease. Musculoskeletal: No acute or suspicious osseous findings. IMPRESSION: 1. Interval decrease in size of left upper quadrant cystic and solid peritoneal lesion. 2. No new sites of disease. 3. Unchanged periumbilical hernia containing a nonobstructed loop of small bowel. 4.  Aortic Atherosclerosis (ICD10-I70.0). Electronically Signed   By: TKerby MoorsM.D.   On: 11/22/2019 11:51

## 2019-11-25 NOTE — Assessment & Plan Note (Signed)
We discussed goals of care She understood treatment is palliative With positive response to treatment, we will continue gemcitabine indefinitely

## 2019-11-25 NOTE — Assessment & Plan Note (Signed)
I reviewed the CT imaging with the patient She have excellent response to therapy She has minimum side effects from treatment except for pancytopenia on day 8 of every cycle I recommend we change her treatment schedule to every other week to allow bone marrow recovery after each dose She agreed to proceed We will plan to repeat imaging study again in 3 months, due around May 2021

## 2019-11-26 ENCOUNTER — Telehealth: Payer: Self-pay

## 2019-11-26 LAB — CA 125: Cancer Antigen (CA) 125: 22.7 U/mL (ref 0.0–38.1)

## 2019-11-26 NOTE — Telephone Encounter (Signed)
Called and given below message. She verbalized understanding. 

## 2019-11-26 NOTE — Telephone Encounter (Signed)
-----   Message from [name], MD sent at 11/26/2019  8:04 AM EST ----- Regarding: pls let her CA-125 is better  ----- Message ----- From: Interface, Lab In [place] Sent: 11/25/2019   9:09 AM EST To: [name], MD

## 2019-11-27 ENCOUNTER — Telehealth: Payer: Self-pay | Admitting: Hematology and Oncology

## 2019-11-27 NOTE — Telephone Encounter (Signed)
Scheduled appt per 2/8 sch message - pt aware of appt date and time   

## 2019-12-02 ENCOUNTER — Other Ambulatory Visit: Payer: Medicare Other

## 2019-12-02 ENCOUNTER — Ambulatory Visit: Payer: Medicare Other

## 2019-12-02 ENCOUNTER — Telehealth: Payer: Self-pay | Admitting: Oncology

## 2019-12-02 NOTE — Telephone Encounter (Signed)
Jessica Johnston called and said she had a Covid Vaccine appointment for 2/18.  She just got a call and her appointment has been rescheduled to 2/19 due to the [place] being [place]. She wants to make sure this is ok with her treatment schedule.

## 2019-12-03 DIAGNOSIS — L720 Epidermal cyst: Secondary | ICD-10-CM | POA: Diagnosis not present

## 2019-12-03 DIAGNOSIS — Z08 Encounter for follow-up examination after completed treatment for malignant neoplasm: Secondary | ICD-10-CM | POA: Diagnosis not present

## 2019-12-03 DIAGNOSIS — L570 Actinic keratosis: Secondary | ICD-10-CM | POA: Diagnosis not present

## 2019-12-03 DIAGNOSIS — Z85820 Personal history of malignant melanoma of skin: Secondary | ICD-10-CM | POA: Diagnosis not present

## 2019-12-03 DIAGNOSIS — L84 Corns and callosities: Secondary | ICD-10-CM | POA: Diagnosis not present

## 2019-12-03 NOTE — Telephone Encounter (Signed)
Yes, it's ok.

## 2019-12-03 NOTE — Telephone Encounter (Signed)
Called [place] and let her know 2/19 is ok for her vaccine.

## 2019-12-05 ENCOUNTER — Telehealth: Payer: Self-pay | Admitting: Oncology

## 2019-12-05 ENCOUNTER — Ambulatory Visit: Payer: Medicare Other

## 2019-12-05 NOTE — Telephone Encounter (Signed)
[facility] and informed her of message from Dr. Alvy Bimler.  She verbalized understanding.

## 2019-12-05 NOTE — Telephone Encounter (Signed)
Jessica Johnston left a message that her Covid vaccine appointment has been rescheduled again to this Sunday, 12/08/19 due to weather and wants to make sure that is ok because she has treatment on Monday.

## 2019-12-05 NOTE — Telephone Encounter (Signed)
Keep her appt as is Tell her some patients may experience fever after vaccine and if so she may take tylenol If she does not feel well on Monday and have persistent fever please call back and we can reschedule

## 2019-12-06 ENCOUNTER — Ambulatory Visit: Payer: Medicare Other

## 2019-12-08 ENCOUNTER — Ambulatory Visit: Payer: Medicare Other | Attending: Internal Medicine

## 2019-12-08 DIAGNOSIS — Z23 Encounter for immunization: Secondary | ICD-10-CM | POA: Insufficient documentation

## 2019-12-08 NOTE — Progress Notes (Signed)
   Covid-19 Vaccination Clinic  Name:  Jessica Johnston    MRN: [id number] DOB: 23-Oct-1940  12/08/2019  Jessica Johnston was observed post Covid-19 immunization for 15 minutes without incidence. She was provided with Vaccine Information Sheet and instruction to access the V-Safe system.   Jessica Johnston was instructed to call 911 with any severe reactions post vaccine: Marland Kitchen Difficulty breathing  . Swelling of your face and throat  . A fast heartbeat  . A bad rash all over your body  . Dizziness and weakness    Immunizations Administered    Name Date Dose VIS Date Route   Pfizer COVID-19 Vaccine 12/08/2019  9:25 AM 0.3 mL 09/27/2019 Intramuscular   Manufacturer: [place]   Lot: [id number]   [place]: [id number]

## 2019-12-09 ENCOUNTER — Inpatient Hospital Stay: Payer: Medicare Other

## 2019-12-09 ENCOUNTER — Other Ambulatory Visit: Payer: Self-pay

## 2019-12-09 ENCOUNTER — Encounter

## 2019-12-09 VITALS — BP 139/75 | HR 77 | Temp 98.5°F | Resp 20 | Ht 64.0 in | Wt 146.1 lb

## 2019-12-09 DIAGNOSIS — C5702 Malignant neoplasm of left fallopian tube: Secondary | ICD-10-CM

## 2019-12-09 DIAGNOSIS — Z7189 Other specified counseling: Secondary | ICD-10-CM

## 2019-12-09 DIAGNOSIS — Z5111 Encounter for antineoplastic chemotherapy: Secondary | ICD-10-CM | POA: Diagnosis not present

## 2019-12-09 DIAGNOSIS — Z95828 Presence of other vascular implants and grafts: Secondary | ICD-10-CM

## 2019-12-09 LAB — CBC WITH DIFFERENTIAL (CANCER CENTER ONLY)
Abs Immature Granulocytes: 0.01 10*3/uL (ref 0.00–0.07)
Basophils Absolute: 0 10*3/uL (ref 0.0–0.1)
Basophils Relative: 0 %
Eosinophils Absolute: 0.2 10*3/uL (ref 0.0–0.5)
Eosinophils Relative: 5 %
HCT: 33.4 % — ABNORMAL LOW (ref 36.0–46.0)
Hemoglobin: 10.9 g/dL — ABNORMAL LOW (ref 12.0–15.0)
Immature Granulocytes: 0 %
Lymphocytes Relative: 22 %
Lymphs Abs: 1.1 10*3/uL (ref 0.7–4.0)
MCH: 31.3 pg (ref 26.0–34.0)
MCHC: 32.6 g/dL (ref 30.0–36.0)
MCV: 96 fL (ref 80.0–100.0)
Monocytes Absolute: 0.8 10*3/uL (ref 0.1–1.0)
Monocytes Relative: 15 %
Neutro Abs: 2.9 10*3/uL (ref 1.7–7.7)
Neutrophils Relative %: 58 %
Platelet Count: 179 10*3/uL (ref 150–400)
RBC: 3.48 MIL/uL — ABNORMAL LOW (ref 3.87–5.11)
RDW: 15.8 % — ABNORMAL HIGH (ref 11.5–15.5)
WBC Count: 5 10*3/uL (ref 4.0–10.5)
nRBC: 0 % (ref 0.0–0.2)

## 2019-12-09 LAB — CMP (CANCER CENTER ONLY)
ALT: 27 U/L (ref 0–44)
AST: 18 U/L (ref 15–41)
Albumin: 3.4 g/dL — ABNORMAL LOW (ref 3.5–5.0)
Alkaline Phosphatase: 77 U/L (ref 38–126)
Anion gap: 10 (ref 5–15)
BUN: 16 mg/dL (ref 8–23)
CO2: 25 mmol/L (ref 22–32)
Calcium: 8.9 mg/dL (ref 8.9–10.3)
Chloride: 103 mmol/L (ref 98–111)
Creatinine: 0.72 mg/dL (ref 0.44–1.00)
GFR, Est AFR Am: >60 mL/min (ref >60)
GFR, Estimated: >60 mL/min (ref >60)
Glucose, Bld: 137 mg/dL — ABNORMAL HIGH (ref 70–99)
Potassium: 4.4 mmol/L (ref 3.5–5.1)
Sodium: 138 mmol/L (ref 135–145)
Total Bilirubin: 0.2 mg/dL — ABNORMAL LOW (ref 0.3–1.2)
Total Protein: 6.7 g/dL (ref 6.5–8.1)

## 2019-12-09 MED ORDER — PROCHLORPERAZINE MALEATE 10 MG PO TABS
ORAL_TABLET | ORAL | Status: AC
  Filled 2019-12-09: qty 1

## 2019-12-09 MED ORDER — HEPARIN SOD (PORK) LOCK FLUSH 100 UNIT/ML IV SOLN
500.0000 [IU] | Freq: Once | INTRAVENOUS | Status: AC | PRN
  Administered 2019-12-09: 15:00:00 500 [IU]
  Filled 2019-12-09: qty 5

## 2019-12-09 MED ORDER — PROCHLORPERAZINE MALEATE 10 MG PO TABS
10.0000 mg | ORAL_TABLET | Freq: Once | ORAL | Status: AC
  Administered 2019-12-09: 14:00:00 10 mg via ORAL

## 2019-12-09 MED ORDER — SODIUM CHLORIDE 0.9 % IV SOLN
Freq: Once | INTRAVENOUS | Status: AC
  Filled 2019-12-09: qty 250

## 2019-12-09 MED ORDER — SODIUM CHLORIDE 0.9 % IV SOLN
600.0000 mg/m2 | Freq: Once | INTRAVENOUS | Status: AC
  Administered 2019-12-09: 15:00:00 1026 mg via INTRAVENOUS
  Filled 2019-12-09: qty 26.98

## 2019-12-09 MED ORDER — SODIUM CHLORIDE 0.9% FLUSH
10.0000 mL | Freq: Once | INTRAVENOUS | Status: AC
  Administered 2019-12-09: 13:00:00 10 mL
  Filled 2019-12-09: qty 10

## 2019-12-09 MED ORDER — SODIUM CHLORIDE 0.9% FLUSH
10.0000 mL | INTRAVENOUS | Status: DC | PRN
  Administered 2019-12-09: 10 mL
  Filled 2019-12-09: qty 10

## 2019-12-09 NOTE — Patient Instructions (Signed)
COVID-19 Vaccine Information can be found at: [email or web address] For questions related to vaccine distribution or appointments, please email [email or web address] or call [phone].   [facility] Discharge Instructions for Patients Receiving Chemotherapy  Today you received the following chemotherapy agents: Gemcitabine (Gemzar)  To help prevent nausea and vomiting after your treatment, we encourage you to take your nausea medication as directed.   If you develop nausea and vomiting that is not controlled by your nausea medication, call the clinic.   BELOW ARE SYMPTOMS THAT SHOULD BE REPORTED IMMEDIATELY:  *FEVER GREATER THAN 100.5 F  *CHILLS WITH OR WITHOUT FEVER  NAUSEA AND VOMITING THAT IS NOT CONTROLLED WITH YOUR NAUSEA MEDICATION  *UNUSUAL SHORTNESS OF BREATH  *UNUSUAL BRUISING OR BLEEDING  TENDERNESS IN MOUTH AND THROAT WITH OR WITHOUT PRESENCE OF ULCERS  *URINARY PROBLEMS  *BOWEL PROBLEMS  UNUSUAL RASH Items with * indicate a potential emergency and should be followed up as soon as possible.  Feel free to call the clinic should you have any questions or concerns. The clinic phone number is [phone].  Please show the [facility] at check-in to the Emergency Department and triage nurse.  Coronavirus (COVID-19) Are you at risk?  Are you at risk for the Coronavirus (COVID-19)?  To be considered HIGH RISK for Coronavirus (COVID-19), you have to meet the following criteria:  . Traveled to [place], [place], [place], [place] or [place]; or in the [place] to [place], [place], [place], or [place]; and have fever, cough, and shortness of breath within the last 2 weeks of travel OR . Been in close contact with a person diagnosed with COVID-19 within the last 2 weeks and have fever, cough, and shortness of breath . IF YOU DO NOT MEET THESE CRITERIA, YOU ARE CONSIDERED LOW RISK FOR  COVID-19.  What to do if you are HIGH RISK for COVID-19?  Marland Kitchen If you are having a medical emergency, call 911. . Seek medical care right away. Before you go to a doctor's office, urgent care or emergency department, call ahead and tell them about your recent travel, contact with someone diagnosed with COVID-19, and your symptoms. You should receive instructions from your physician's office regarding next steps of care.  . When you arrive at healthcare provider, tell the healthcare staff immediately you have returned from visiting [place], [place], [place], [place] or [place]; or traveled in the [place] to [place], [place], [place], or [place]; in the last two weeks or you have been in close contact with a person diagnosed with COVID-19 in the last 2 weeks.   . Tell the health care staff about your symptoms: fever, cough and shortness of breath. . After you have been seen by a medical provider, you will be either: o Tested for (COVID-19) and discharged home on quarantine except to seek medical care if symptoms worsen, and asked to  - Stay home and avoid contact with others until you get your results (4-5 days)  - Avoid travel on public transportation if possible (such as bus, train, or airplane) or o Sent to the Emergency Department by EMS for evaluation, COVID-19 testing, and possible admission depending on your condition and test results.  What to do if you are LOW RISK for COVID-19?  Reduce your risk of any infection by using the same precautions used for avoiding the common cold or flu:  Marland Kitchen Wash your hands often with soap and warm water for at least 20 seconds.  If soap and water are not readily available, use an alcohol-based hand sanitizer with at least 60% alcohol.  . If coughing or sneezing, cover your mouth and nose by coughing or sneezing into the elbow areas of your shirt or coat, into a tissue or into your sleeve (not your hands). . Avoid shaking hands with others and consider  head nods or verbal greetings only. . Avoid touching your eyes, nose, or mouth with unwashed hands.  . Avoid close contact with people who are sick. . Avoid places or events with large numbers of people in one location, like concerts or sporting events. . Carefully consider travel plans you have or are making. . If you are planning any travel outside or inside the [place], visit the CDC's Travelers' Health webpage for the latest health notices. . If you have some symptoms but not all symptoms, continue to monitor at home and seek medical attention if your symptoms worsen. . If you are having a medical emergency, call 911.   [facility] / e-Visit: [email or web address]         [facility]: [facility]: [phone]                   [facility]: [phone]

## 2019-12-13 MED ORDER — ALENDRONATE 35 MG TAB
35 mg | ORAL_TABLET | ORAL | 0 refills | Status: DC
Start: 2019-12-13 — End: 2020-01-02

## 2019-12-13 NOTE — Telephone Encounter (Signed)
12/13/2019 at 2:38 pm--This user called and spoke with the patient and let her know that we sent the Fosamax into the pharmacy for 3 months but that further refills need to go through her PCP. Patient verbalized understanding and had no further question's or concerns at that time.

## 2019-12-13 NOTE — Telephone Encounter (Signed)
Patient called and stated that she needed her fosamax filled for 3 months at a time. CB# [phone]

## 2019-12-23 ENCOUNTER — Other Ambulatory Visit: Payer: Medicare Other

## 2019-12-23 ENCOUNTER — Encounter: Payer: Self-pay | Admitting: Hematology and Oncology

## 2019-12-23 ENCOUNTER — Inpatient Hospital Stay: Payer: Medicare Other | Attending: Gynecologic Oncology

## 2019-12-23 ENCOUNTER — Inpatient Hospital Stay (HOSPITAL_BASED_OUTPATIENT_CLINIC_OR_DEPARTMENT_OTHER): Payer: Medicare Other | Admitting: Hematology and Oncology

## 2019-12-23 ENCOUNTER — Inpatient Hospital Stay: Payer: Medicare Other

## 2019-12-23 ENCOUNTER — Telehealth: Payer: Self-pay | Admitting: Hematology and Oncology

## 2019-12-23 ENCOUNTER — Other Ambulatory Visit: Payer: Self-pay

## 2019-12-23 DIAGNOSIS — Z5111 Encounter for antineoplastic chemotherapy: Secondary | ICD-10-CM | POA: Insufficient documentation

## 2019-12-23 DIAGNOSIS — C5700 Malignant neoplasm of unspecified fallopian tube: Secondary | ICD-10-CM | POA: Diagnosis not present

## 2019-12-23 DIAGNOSIS — Z7189 Other specified counseling: Secondary | ICD-10-CM

## 2019-12-23 DIAGNOSIS — C5702 Malignant neoplasm of left fallopian tube: Secondary | ICD-10-CM | POA: Diagnosis not present

## 2019-12-23 DIAGNOSIS — D6481 Anemia due to antineoplastic chemotherapy: Secondary | ICD-10-CM | POA: Diagnosis not present

## 2019-12-23 DIAGNOSIS — T451X5A Adverse effect of antineoplastic and immunosuppressive drugs, initial encounter: Secondary | ICD-10-CM

## 2019-12-23 DIAGNOSIS — Z95828 Presence of other vascular implants and grafts: Secondary | ICD-10-CM

## 2019-12-23 LAB — CBC WITH DIFFERENTIAL (CANCER CENTER ONLY)
Abs Immature Granulocytes: 0.03 10*3/uL (ref 0.00–0.07)
Basophils Absolute: 0 10*3/uL (ref 0.0–0.1)
Basophils Relative: 1 %
Eosinophils Absolute: 0.2 10*3/uL (ref 0.0–0.5)
Eosinophils Relative: 4 %
HCT: 34.3 % — ABNORMAL LOW (ref 36.0–46.0)
Hemoglobin: 11.1 g/dL — ABNORMAL LOW (ref 12.0–15.0)
Immature Granulocytes: 1 %
Lymphocytes Relative: 21 %
Lymphs Abs: 1 10*3/uL (ref 0.7–4.0)
MCH: 31 pg (ref 26.0–34.0)
MCHC: 32.4 g/dL (ref 30.0–36.0)
MCV: 95.8 fL (ref 80.0–100.0)
Monocytes Absolute: 0.7 10*3/uL (ref 0.1–1.0)
Monocytes Relative: 15 %
Neutro Abs: 2.9 10*3/uL (ref 1.7–7.7)
Neutrophils Relative %: 58 %
Platelet Count: 244 10*3/uL (ref 150–400)
RBC: 3.58 MIL/uL — ABNORMAL LOW (ref 3.87–5.11)
RDW: 15 % (ref 11.5–15.5)
WBC Count: 5 10*3/uL (ref 4.0–10.5)
nRBC: 0 % (ref 0.0–0.2)

## 2019-12-23 LAB — CMP (CANCER CENTER ONLY)
ALT: 28 U/L (ref 0–44)
AST: 22 U/L (ref 15–41)
Albumin: 3.4 g/dL — ABNORMAL LOW (ref 3.5–5.0)
Alkaline Phosphatase: 68 U/L (ref 38–126)
Anion gap: 10 (ref 5–15)
BUN: 15 mg/dL (ref 8–23)
CO2: 25 mmol/L (ref 22–32)
Calcium: 8.9 mg/dL (ref 8.9–10.3)
Chloride: 105 mmol/L (ref 98–111)
Creatinine: 0.74 mg/dL (ref 0.44–1.00)
GFR, Est AFR Am: >60 mL/min (ref >60)
GFR, Estimated: >60 mL/min (ref >60)
Glucose, Bld: 97 mg/dL (ref 70–99)
Potassium: 4.3 mmol/L (ref 3.5–5.1)
Sodium: 140 mmol/L (ref 135–145)
Total Bilirubin: 0.2 mg/dL — ABNORMAL LOW (ref 0.3–1.2)
Total Protein: 6.5 g/dL (ref 6.5–8.1)

## 2019-12-23 MED ORDER — SODIUM CHLORIDE 0.9 % IV SOLN
600.0000 mg/m2 | Freq: Once | INTRAVENOUS | Status: AC
  Administered 2019-12-23: 1026 mg via INTRAVENOUS
  Filled 2019-12-23: qty 26.98

## 2019-12-23 MED ORDER — PROCHLORPERAZINE MALEATE 10 MG PO TABS
ORAL_TABLET | ORAL | Status: AC
  Filled 2019-12-23: qty 1

## 2019-12-23 MED ORDER — SODIUM CHLORIDE 0.9% FLUSH
10.0000 mL | INTRAVENOUS | Status: DC | PRN
  Administered 2019-12-23: 10 mL
  Filled 2019-12-23: qty 10

## 2019-12-23 MED ORDER — SODIUM CHLORIDE 0.9% FLUSH
10.0000 mL | Freq: Once | INTRAVENOUS | Status: AC
  Administered 2019-12-23: 10 mL
  Filled 2019-12-23: qty 10

## 2019-12-23 MED ORDER — PROCHLORPERAZINE MALEATE 10 MG PO TABS
10.0000 mg | ORAL_TABLET | Freq: Once | ORAL | Status: AC
  Administered 2019-12-23: 10 mg via ORAL

## 2019-12-23 MED ORDER — SODIUM CHLORIDE 0.9 % IV SOLN
Freq: Once | INTRAVENOUS | Status: AC
  Filled 2019-12-23: qty 250

## 2019-12-23 MED ORDER — HEPARIN SOD (PORK) LOCK FLUSH 100 UNIT/ML IV SOLN
500.0000 [IU] | Freq: Once | INTRAVENOUS | Status: AC | PRN
  Administered 2019-12-23: 500 [IU]
  Filled 2019-12-23: qty 5

## 2019-12-23 NOTE — Patient Instructions (Signed)
[facility] °Discharge Instructions for Patients Receiving Chemotherapy ° °Today you received the following chemotherapy agents Gemzar ° °To help prevent nausea and vomiting after your treatment, we encourage you to take your nausea medication as directed. °  °If you develop nausea and vomiting that is not controlled by your nausea medication, call the clinic.  ° °BELOW ARE SYMPTOMS THAT SHOULD BE REPORTED IMMEDIATELY: °· *FEVER GREATER THAN 100.5 F °· *CHILLS WITH OR WITHOUT FEVER °· NAUSEA AND VOMITING THAT IS NOT CONTROLLED WITH YOUR NAUSEA MEDICATION °· *UNUSUAL SHORTNESS OF BREATH °· *UNUSUAL BRUISING OR BLEEDING °· TENDERNESS IN MOUTH AND THROAT WITH OR WITHOUT PRESENCE OF ULCERS °· *URINARY PROBLEMS °· *BOWEL PROBLEMS °· UNUSUAL RASH °Items with * indicate a potential emergency and should be followed up as soon as possible. ° °Feel free to call the clinic should you have any questions or concerns. The clinic phone number is [phone]. ° °Please show the CHEMO ALERT CARD at check-in to the Emergency Department and triage nurse. ° ° °

## 2019-12-23 NOTE — Assessment & Plan Note (Signed)
Overall, she tolerated treatment well she has minimum pain we will continue treatment in a palliative fashion as schedule I have addressed all her questions.

## 2019-12-23 NOTE — Assessment & Plan Note (Signed)
I reviewed the CT imaging with the patient She have excellent response to therapy She has minimum side effects from treatment except for pancytopenia on day 8 of every cycle I recommend we change her treatment schedule to every other week to allow bone marrow recovery after each dose since we change her treatment to every other week, her pancytopenia is much improved  she will continue the current modified schedule We will plan to repeat imaging study again in a few months, due around May 2021

## 2019-12-23 NOTE — Assessment & Plan Note (Signed)

## 2019-12-23 NOTE — Telephone Encounter (Signed)
Scheduled appt per 3/8 sch message - pt to get an updated schedule in tx area

## 2019-12-23 NOTE — Progress Notes (Signed)
[facility] OFFICE PROGRESS NOTE  Patient Care Team: [name], MD as PCP - General (Internal Medicine)  ASSESSMENT & PLAN:  Fallopian tube cancer, carcinoma ([place]) I reviewed the CT imaging with the patient She have excellent response to therapy She has minimum side effects from treatment except for pancytopenia on day 8 of every cycle I recommend we change her treatment schedule to every other week to allow bone marrow recovery after each dose since we change her treatment to every other week, her pancytopenia is much improved  she will continue the current modified schedule We will plan to repeat imaging study again in a few months, due around May 2021  Anemia due to antineoplastic chemotherapy This is likely due to recent treatment. The patient denies recent history of bleeding such as epistaxis, hematuria or hematochezia. She is asymptomatic from the anemia. I will observe for now.  She does not require transfusion now. I will continue the chemotherapy at current dose without dosage adjustment.  If the anemia gets progressive worse in the future, I might have to delay her treatment or adjust the chemotherapy dose.   Goals of care, counseling/discussion Overall, she tolerated treatment well she has minimum pain we will continue treatment in a palliative fashion as schedule I have addressed all her questions.   No orders of the defined types were placed in this encounter.   All questions were answered. The patient knows to call the clinic with any problems, questions or concerns. The total time spent in the appointment was 20 minutes encounter with patients including review of chart and various tests results, discussions about plan of care and coordination of care plan   [name], MD 12/23/2019 11:58 AM  INTERVAL HISTORY: Please see below for problem oriented charting. 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 Extensive omental nodularity highly worrisome for peritoneal carcinomatosis. Late recurrence of melanoma can present as peritoneal carcinomatosis. Ovarian cancer more commonly presents in this manner. Patient does have a predominately low density right adnexal lesion measuring up to 3.5 cm, although this lesion is not typical for ovarian cancer. 2. No evidence of bowel or ureteral obstruction. 3. No other definite signs of metastatic disease. There are small indeterminate low-density hepatic lesions.   02/08/2016 Tumor Marker   Patient's tumor was tested for the following markers: CA-125 Results of the tumor marker test revealed 656.2   02/15/2016 Procedure   CT-guided core biopsy performed of omental mass just deep to the abdominal wall.   02/15/2016 Pathology Results   Omentum, biopsy, left - PAPILLARY SEROUS NEOPLASM, SEE COMMENT. Microscopic Comment There are papillary collections of largely low grade appearing cells with numerous psammoma bodies. Given the limited material it is difficult to distinguish between invasive implants of a serous  borderline tumor and serous carcinoma, especially given the low grade appearance.   02/18/2016 Pathology Results   1. Omentum, resection for tumor INVASIVE  IMPLANT OF HIGH GRADE SEROUS CARCINOMA 2. Uterus +/- tubes/ovaries, neoplastic HIGH GRADE SEROUS CARCINOMA INVOLVING LEFT TUBAL FIMBRIA SEROUS CARCINOMA WITH PREDOMINANT PSAMMOMA BODIES IMPLANT AT UTERUS SEROSA, BILATERAL FALLOPIAN TUBAL SEROSA, AND ANTERIOR PERITONEAL REFLECTION CERVIX: HISTOLOGICAL UNREMARKABLE ENDOMETRIUM: INACTIVE ENDOMETRIUM MYOMETRIUM: LEIOMYOMA LEFT OVARY: CYSTADENOFIBROMA RIGHT OVARY AND FALLOPIAN TUBE: HISTOLOGICAL UNREMARKABLE Microscopic Comment 2. ONCOLOGY TABLE - FALLOPIAN TUBE 1. Specimen, including laterality: Omentum, uterus, bilateral ovaries and fallopian tubes 2. Procedure: Hysterectomy, bilateral salpingo-oophorectomy and tumor debulking omentectomy 3. Lymph node sampling performed: No 4. Tumor site: uterus serosa, bilateral fallopian tubal serosa, peritoneal and omentum 5. Tumor location in fallopian tube: Left fallopian tubal fimbria 6. Specimen integrity (intact/ruptured/disrupted): Intact 7. Tumor size (cm): multi focal invasive omentum implant greater than 2 cm, left fallopian tube tumor 0.8 cm. 8. Histologic type: Serous carcinoma 9. Grade: 3 10. Microscopic tumor extension: uterus serosa, bilateral fallopian tubal serosa, peritoneal and omentum 11. Margins: NA 12. Lymph-Vascular invasion: identified 13. Lymph nodes: # examined: 0; # positive: NA 14. TNM: pT3c, pNx 15. FIGO Stage (based on pathologic findings, needs clinical correlation: IIIC 16. Comment: High grade serous carcinoma multifocally and extensively involves left fallopian tubal fimbria, omentum, uterus serosa, bilateral fallopian tubal serosa and peritoneal. At the left fallopian tube there are small foci of serous tubal intraepithelial carcinoma identified, so we conclude the carcinoma is fallopian tube origin   03/09/2016 Tumor Marker   Patient's tumor was tested for the following markers: CA-125 Results of the tumor marker test revealed 154.4   03/15/2016 Procedure    Technically successful right IJ power-injectable port catheter placement. Ready for routine use.   03/18/2016 - 07/13/2016 Chemotherapy   The patient had 6 cycles of carboplatin and taxol   04/14/2016 Tumor Marker   Patient's tumor was tested for the following markers: CA-125 Results of the tumor marker test revealed 36.7   04/25/2016 Genetic Testing   Patient has genetic testing done for germline mutation Results revealed patient has no mutation   04/28/2016 Tumor Marker   Patient's tumor was tested for the following markers: CA-125 Results of the tumor marker test revealed 24.6   06/21/2016 Tumor Marker   Patient's tumor was tested for the following markers: CA-125 Results of the tumor marker test revealed 16.6   08/01/2016 Tumor Marker   Patient's tumor was tested for the following markers: CA-125 Results of the tumor marker test revealed 17.2   08/05/2016 Imaging   Interval TAH-BSO. No evidence of residual pelvic mass or metastatic disease within the abdomen or pelvis. No other acute findings.    11/04/2016 Tumor Marker   Patient's tumor was tested for the following markers: CA-125 Results of the tumor marker test revealed 13.6   01/20/2017 Tumor Marker   Patient's tumor was tested for the following markers: CA-125 Results of the tumor marker test revealed 13.8   04/14/2017 Tumor Marker   Patient's tumor was tested for the following markers: CA-125 Results of the tumor marker test revealed 16.1   06/13/2017 Imaging   No acute findings.  No mass or hernia identified.  Colonic diverticulosis, without radiographic evidence of diverticulitis.  Aortic atherosclerosis.   07/03/2017 Tumor Marker   Patient's tumor was tested for the following markers: CA-125 Results of the tumor marker test revealed 18.4   09/19/2017 Tumor Marker   Patient's tumor was tested for the following markers:  CA-125 Results of the tumor marker test revealed 18.5   01/23/2018 Tumor Marker   Patient's  tumor was tested for the following markers: CA-125 Results of the tumor marker test revealed 35.4   01/30/2018 Imaging   1. No evidence of local fallopian tube carcinoma recurrence within the pelvis. Post hysterectomy. 2. No evidence of metastatic peritoneal disease or omental disease. No solid organ metastasis.    03/20/2018 Tumor Marker   Patient's tumor was tested for the following markers: CA-125 Results of the tumor marker test revealed 73   05/02/2018 Tumor Marker   Patient's tumor was tested for the following markers: CA-125 Results of the tumor marker test revealed 127.1   05/08/2018 Imaging   CT abdomen and pelvis 1. 7 cm left subdiaphragmatic collection of loculated fluid versus low-density soft tissue. Given the patient's history of rising CA 125, metastatic disease considered highly likely.  2. Areas of fluid or recurrent disease identified in the right lower quadrant adjacent to the cecum and along right lower quadrant small bowel loops.   05/14/2018 Tumor Marker   Patient's tumor was tested for the following markers: CA-125 Results of the tumor marker test revealed 166    Genetic Testing   Patient has genetic testing done for ER/PR and MMR. Results revealed patient has the following on 02/18/2016 surgical pathology: ER 20%, PR 0% MMR: normal    Genetic Testing   Patient has genetic testing done for MSI. Results revealed patient has the following: MSI stable   05/18/2018 - 12/31/2018 Chemotherapy   The patient had carboplatin and Doxil   07/16/2018 Tumor Marker   Patient's tumor was tested for the following markers: CA-125 Results of the tumor marker test revealed 28.7   08/10/2018 Imaging   CT imaging:  Decreased size of peritoneal soft tissue masses in left upper quadrant, consistent with decreased metastatic disease.  No new or progressive metastatic disease. No other acute findings.  Colonic diverticulosis, without radiographic evidence of  diverticulitis.  Stable small paraumbilical ventral hernia containing transverse colon.   08/13/2018 Tumor Marker   Patient's tumor was tested for the following markers: CA-125 Results of the tumor marker test revealed 21.6   08/24/2018 Echocardiogram   LV EF: 60% -  65%   10/15/2018 Tumor Marker   Patient's tumor was tested for the following markers: CA-125 Results of the tumor marker test revealed 20.1   10/19/2018 Imaging   Ct scan of abdomen and pelvis Status post hysterectomy, bilateral salpingo-oophorectomy, and omentectomy.  Two peritoneal implants in the left upper abdomen are stable versus mildly decreased, as above. No abdominopelvic ascites.  No evidence of new/progressive metastatic disease.     11/19/2018 Tumor Marker   Patient's tumor was tested for the following markers: CA-125 Results of the tumor marker test revealed 21.7   11/23/2018 Echocardiogram   1. The left ventricle has normal systolic function of 16-10%. The cavity size was normal. There is no increased left ventricular wall thickness. Echo evidence of impaired diastolic relaxation.  2. The right ventricle has normal systolic function. The cavity was normal. There is no increase in right ventricular wall thickness.  3. The mitral valve is normal in structure. There is mild mitral annular calcification present.  4. The tricuspid valve is normal in structure.  5. The aortic valve is tricuspid There is mild thickening of the aortic valve.  6. The pulmonic valve was normal in structure. Pulmonic valve regurgitation is mild by color flow Doppler.  7. Normal LV systolic  function; mild diastolic dysfunction.   12/31/2018 Tumor Marker   Patient's tumor was tested for the following markers: CA-125 Results of the tumor marker test revealed 17.1   01/31/2019 Imaging   1. Left upper quadrant peritoneal implants described previously have decreased in the interval. No new or progressive findings in  the abdomen/pelvis today. No free fluid. 2. Right paraumbilical ventral hernia contains a small knuckle of transverse colon without complicating features. 3.  Aortic Atherosclerois (ICD10-170.0)  Aortic Atherosclerosis (ICD10-I70.0).   01/31/2019 Tumor Marker   Patient's tumor was tested for the following markers: CA-125 Results of the tumor marker test revealed 18.9   02/11/2019 - 03/13/2019 Chemotherapy   The patient is taking Niraparib. She self discontinued after 1 month   02/25/2019 Tumor Marker   Patient's tumor was tested for the following markers: CA-125 Results of the tumor marker test revealed 18.6   05/13/2019 Tumor Marker   Patient's tumor was tested for the following markers: CA-125 Results of the tumor marker test revealed 230   05/22/2019 Imaging   1. Increased size of 4.6 cm soft tissue mass in the gastrosplenic ligament, consistent with metastatic disease. 2. No other sites of metastatic disease identified within the abdomen or pelvis. 3. Colonic diverticulosis. No radiographic evidence of diverticulitis. 4. Stable small right paraumbilical hernia containing transverse colon.   Aortic Atherosclerosis (ICD10-I70.0).   05/31/2019 - 08/22/2019 Chemotherapy   The patient had bevacizumab and Taxol for chemotherapy treatment.     05/31/2019 Tumor Marker   Patient's tumor was tested for the following markers: CA-125 Results of the tumor marker test revealed 84   06/21/2019 Tumor Marker   Patient's tumor was tested for the following markers: CA-125 Results of the tumor marker test revealed 49.4.   07/19/2019 Tumor Marker   Patient's tumor was tested for the following markers: CA-125. Results of the tumor marker test revealed 44.5   08/22/2019 Imaging   1. Increase in size of 5.0 cm mass in the gastro splenic ligament consistent with metastatic disease. 2. No additional sites of disease identified within the abdomen or pelvis. 3. Unchanged paraumbilical hernia containing  nonobstructed loop of large bowel. 4.  Aortic Atherosclerosis (ICD10-I70.0).     08/30/2019 Tumor Marker   Patient's tumor was tested for the following markers: CA-125 Results of the tumor marker test revealed 71.5   08/30/2019 -  Chemotherapy   The patient had gemzar for chemotherapy treatment.     09/27/2019 Tumor Marker   Patient's tumor was tested for the following markers: CA-125. Results of the tumor marker test revealed 38.5   10/14/2019 Tumor Marker   Patient's tumor was tested for the following markers: CA-125 Results of the tumor marker test revealed 40.8   10/21/2019 Tumor Marker   Patient's tumor was tested for the following markers: CA-125 Results of the tumor marker test revealed 34.8.   11/22/2019 Imaging   1. Interval decrease in size of left upper quadrant cystic and solid peritoneal lesion. 2. No new sites of disease. 3. Unchanged periumbilical hernia containing a nonobstructed loop of small bowel. 4.  Aortic Atherosclerosis (ICD10-I70.0).     11/25/2019 Tumor Marker   Patient's tumor was tested for the following markers: CA-125 Results of the tumor marker test revealed 22.7     REVIEW OF SYSTEMS:   Constitutional: Denies fevers, chills or abnormal weight loss Eyes: Denies blurriness of vision Ears, nose, mouth, throat, and face: Denies mucositis or sore throat Respiratory: Denies cough, dyspnea or wheezes Cardiovascular: Denies palpitation,  chest discomfort or lower extremity swelling Gastrointestinal:  Denies nausea, heartburn or change in bowel habits Skin: Denies abnormal skin rashes Lymphatics: Denies new lymphadenopathy or easy bruising Neurological:Denies numbness, tingling or new weaknesses Behavioral/Psych: Mood is stable, no new changes  All other systems were reviewed with the patient and are negative.  I have reviewed the past medical history, past surgical history, social history and family history with the patient and they are unchanged from  previous note.  ALLERGIES:  has No Known Allergies.  MEDICATIONS:  Current Outpatient Medications  Medication Sig Dispense Refill  . Cholecalciferol (VITAMIN D) 2000 units CAPS Take 1 capsule by mouth daily.    Marland Kitchen ezetimibe (ZETIA) 10 MG tablet Take 10 mg by mouth daily.    Marland Kitchen lidocaine-prilocaine (EMLA) cream Apply to affected area once 30 g 3  . morphine (MSIR) 15 MG tablet Take 1 tablet (15 mg total) by mouth every 4 (four) hours as needed for severe pain. 30 tablet 0  . ondansetron (ZOFRAN) 8 MG tablet Take 1 tablet (8 mg total) by mouth 2 (two) times daily as needed (Nausea or vomiting). 30 tablet 1  . prochlorperazine (COMPAZINE) 10 MG tablet Take 1 tablet (10 mg total) by mouth every 6 (six) hours as needed (Nausea or vomiting). 30 tablet 1   No current facility-administered medications for this visit.   Facility-Administered Medications Ordered in Other Visits  Medication Dose Route Frequency Provider Last Rate Last Admin  . gemcitabine (GEMZAR) 1,026 mg in sodium chloride 0.9 % 250 mL chemo infusion  600 mg/m2 (Treatment Plan Recorded) Intravenous Once Alvy Bimler, Aliannah Holstrom, MD      . heparin lock flush 100 unit/mL  500 Units Intracatheter Once PRN Alvy Bimler, Zniyah Midkiff, MD      . sodium chloride flush (NS) 0.9 % injection 10 mL  10 mL Intracatheter PRN Alvy Bimler, Von Quintanar, MD        PHYSICAL EXAMINATION: ECOG PERFORMANCE STATUS: 1 - Symptomatic but completely ambulatory  Vitals:   12/23/19 1048  BP: 134/71  Pulse: 67  Resp: 18  Temp: 97.8 F (36.6 C)  SpO2: 100%   Filed Weights   12/23/19 1048  Weight: 145 lb 6.4 oz (66 kg)    GENERAL:alert, no distress and comfortable SKIN: skin color, texture, turgor are normal, no rashes or significant lesions EYES: normal, Conjunctiva are pink and non-injected, sclera clear OROPHARYNX:no exudate, no erythema and lips, buccal mucosa, and tongue normal  NECK: supple, thyroid normal size, non-tender, without nodularity LYMPH:  no palpable lymphadenopathy in  the cervical, axillary or inguinal LUNGS: clear to auscultation and percussion with normal breathing effort HEART: regular rate & rhythm and no murmurs and no lower extremity edema ABDOMEN:abdomen soft, non-tender and normal bowel sounds Musculoskeletal:no cyanosis of digits and no clubbing  NEURO: alert & oriented x 3 with fluent speech, no focal motor/sensory deficits  LABORATORY DATA:  I have reviewed the data as listed    Component Value Date/Time   NA 140 12/23/2019 1020   NA 139 06/07/2017 1430   K 4.3 12/23/2019 1020   K 3.8 06/07/2017 1430   CL 105 12/23/2019 1020   CO2 25 12/23/2019 1020   CO2 27 06/07/2017 1430   GLUCOSE 97 12/23/2019 1020   GLUCOSE 124 06/07/2017 1430   BUN 15 12/23/2019 1020   BUN 14.0 06/07/2017 1430   CREATININE 0.74 12/23/2019 1020   CREATININE 0.8 06/07/2017 1430   CALCIUM 8.9 12/23/2019 1020   CALCIUM 9.3 06/07/2017 1430   PROT 6.5  12/23/2019 1020   PROT 6.6 09/30/2016 1027   ALBUMIN 3.4 (L) 12/23/2019 1020   ALBUMIN 3.5 09/30/2016 1027   AST 22 12/23/2019 1020   AST 18 09/30/2016 1027   ALT 28 12/23/2019 1020   ALT 21 09/30/2016 1027   ALKPHOS 68 12/23/2019 1020   ALKPHOS 77 09/30/2016 1027   BILITOT 0.2 (L) 12/23/2019 1020   BILITOT 0.37 09/30/2016 1027   GFRNONAA >60 12/23/2019 1020   GFRAA >60 12/23/2019 1020    No results found for: SPEP, UPEP  Lab Results  Component Value Date   WBC 5.0 12/23/2019   NEUTROABS 2.9 12/23/2019   HGB 11.1 (L) 12/23/2019   HCT 34.3 (L) 12/23/2019   MCV 95.8 12/23/2019   PLT 244 12/23/2019      Chemistry      Component Value Date/Time   NA 140 12/23/2019 1020   NA 139 06/07/2017 1430   K 4.3 12/23/2019 1020   K 3.8 06/07/2017 1430   CL 105 12/23/2019 1020   CO2 25 12/23/2019 1020   CO2 27 06/07/2017 1430   BUN 15 12/23/2019 1020   BUN 14.0 06/07/2017 1430   CREATININE 0.74 12/23/2019 1020   CREATININE 0.8 06/07/2017 1430      Component Value Date/Time   CALCIUM 8.9 12/23/2019 1020    CALCIUM 9.3 06/07/2017 1430   ALKPHOS 68 12/23/2019 1020   ALKPHOS 77 09/30/2016 1027   AST 22 12/23/2019 1020   AST 18 09/30/2016 1027   ALT 28 12/23/2019 1020   ALT 21 09/30/2016 1027   BILITOT 0.2 (L) 12/23/2019 1020   BILITOT 0.37 09/30/2016 1027

## 2019-12-24 LAB — CA 125: Cancer Antigen (CA) 125: 26.3 U/mL (ref 0.0–38.1)

## 2019-12-31 ENCOUNTER — Telehealth: Payer: Self-pay | Admitting: Oncology

## 2019-12-31 NOTE — Telephone Encounter (Signed)
Yes ok 

## 2019-12-31 NOTE — Telephone Encounter (Signed)
Jessica Johnston left a message asking if it is ok for her to go to the dentist to get her teeth cleaned. She also mentioned she is getting her second covid vaccine tomorrow.

## 2019-12-31 NOTE — Telephone Encounter (Signed)
[facility] and advised her of message below from Dr. Alvy Bimler.  She verbalized understanding.

## 2020-01-01 ENCOUNTER — Ambulatory Visit: Payer: Medicare Other | Attending: Internal Medicine

## 2020-01-01 DIAGNOSIS — Z23 Encounter for immunization: Secondary | ICD-10-CM

## 2020-01-01 NOTE — Progress Notes (Signed)
   Covid-19 Vaccination Clinic  Name:  Jurline Sprung    MRN: [id number] DOB: 08-31-1941  01/01/2020  Ms. Rochez was observed post Covid-19 immunization for 15 minutes without incident. She was provided with Vaccine Information Sheet and instruction to access the V-Safe system.   Ms. Stain was instructed to call 911 with any severe reactions post vaccine: Marland Kitchen Difficulty breathing  . Swelling of face and throat  . A fast heartbeat  . A bad rash all over body  . Dizziness and weakness   Immunizations Administered    Name Date Dose VIS Date Route   Pfizer COVID-19 Vaccine 01/01/2020  9:11 AM 0.3 mL 09/27/2019 Intramuscular   Manufacturer: [place]   Lot: [id number]   [place]: [id number]

## 2020-01-02 ENCOUNTER — Ambulatory Visit: Admit: 2020-01-02 | Discharge: 2020-01-02 | Payer: MEDICARE | Attending: Family Medicine | Primary: Family Medicine

## 2020-01-02 ENCOUNTER — Ambulatory Visit: Attending: Family Medicine | Primary: Family Medicine

## 2020-01-02 DIAGNOSIS — Z780 Asymptomatic menopausal state: Secondary | ICD-10-CM

## 2020-01-02 DIAGNOSIS — M8580 Other specified disorders of bone density and structure, unspecified site: Secondary | ICD-10-CM

## 2020-01-02 MED ORDER — ALENDRONATE 35 MG TAB
35 mg | ORAL_TABLET | ORAL | 4 refills | Status: DC
Start: 2020-01-02 — End: 2021-03-23

## 2020-01-02 NOTE — Progress Notes (Signed)
Triglycerides and diabetes marker went up some pointing to a higher carb/sugar diet.  Please work on your diet and we can recheck these in 6 months.

## 2020-01-02 NOTE — Progress Notes (Signed)
 Chief Complaint   Patient presents with   . Labs     Fasting     Pt presents in office today for fasting labs.     Pt expresses wanting a bone density test.     Pt states she was in [facility] of Oct 2020, for abdominal pain.  Was not admitted, but stopped taking metformin and has since had relief.     Pt also recently stopped taking Effexor.         1. Have you been to the ER, urgent care clinic since your last visit?  Hospitalized since your last visit?No    2. Have you seen or consulted any other health care providers outside of the [facility] since your last visit?  Include any pap smears or colon screening. No

## 2020-01-02 NOTE — Progress Notes (Signed)
Progress Note    she is a 79 y.o. year old female who presents for evalution.    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   Last was 2018.      Pt with NIDDM and prior A1C was 6.5.  This is controlled by diet.  HLD on lipitor and tolerates this.  She is fasting today.  Last LDL was at goal.  HTN at goal on medication.  Pt has been watching her diet    Has on going back pain and states had  2 sessions of PT and would like to go back.    Reviewed PmHx, RxHx, FmHx, SocHx, AllgHx and updated and dated in the chart.    Review of Systems - negative except as listed above in the HPI    Objective:     Vitals:    01/02/20 0829   BP: 98/64   Pulse: 70   Resp: 18   Temp: 97.5 ??F (36.4 ??C)   TempSrc: Temporal   SpO2: 95%   Weight: 168 lb 12.8 oz (76.6 kg)   Height: 5\' 3"  (1.6 m)       Current Outpatient Medications   Medication Sig   ??? alendronate (FOSAMAX) 35 mg tablet TAKE 1 TABLET EVERY 7 DAYS   ??? ergocalciferol (Vitamin D2) 1,250 mcg (50,000 unit) capsule TAKE 1 CAPSULE EVERY 7 DAYS   ??? olmesartan-hydroCHLOROthiazide (BENICAR HCT) 20-12.5 mg per tablet Take 1 Tab by mouth daily.   ??? atorvastatin (LIPITOR) 40 mg tablet TAKE 1 TABLET DAILY BEFORE BREAKFAST   ??? Cetirizine (ZYRTEC) 10 mg cap Take  by mouth as needed.   ??? krill-om-3-dha-epa-phospho-ast (MEGARED OMEGA-3 KRILL OIL) 1,000-230-60 mg cap Take 1 Tab by mouth two (2) times a day.   ??? brimonidine-timolol (COMBIGAN) 0.2-0.5 % drop ophthalmic solution Administer 1 Drop to both eyes every twelve (12) hours.   ??? aspirin 81 mg chewable tablet Take 81 mg by mouth daily.   ??? metFORMIN ER (GLUCOPHAGE XR) 500 mg tablet Take 1 Tab by mouth daily (with dinner).   ??? mupirocin (BACTROBAN) 2 % ointment Apply  to affected area four (4) times daily.   ??? anastrozole (ARIMIDEX) 1 mg tablet Take 1 mg by mouth daily.     No current facility-administered medications for this visit.        Physical Examination: General appearance - alert, well  appearing, and in no distress  Mental status - alert, oriented to person, place, and time  Chest - clear to auscultation, no wheezes, rales or rhonchi, symmetric air entry  Heart - normal rate, regular rhythm, normal S1, S2, no murmurs, rubs, clicks or gallops      Assessment/ Plan:   Diagnoses and all orders for this visit:    1. Osteopenia, unspecified location  -     alendronate (FOSAMAX) 35 mg tablet; TAKE 1 TABLET EVERY 7 DAYS    2. Postmenopausal state  -     DEXA BONE DENSITY STUDY AXIAL; Future    3. Controlled type 2 diabetes mellitus without complication, without long-term current use of insulin (HCC)  -     HEMOGLOBIN A1C WITH EAG; Future    4. Mixed hyperlipidemia  -     METABOLIC PANEL, COMPREHENSIVE; Future  -     LIPID PANEL; Future    5. Essential hypertension  -     METABOLIC PANEL, COMPREHENSIVE; Future    6. Encounter for  hepatitis C screening test for low risk patient  -     HEPATITIS C AB; Future    7. Bilateral low back pain without sciatica, unspecified chronicity  -     REFERRAL TO PHYSICAL THERAPY       Follow-up and Dispositions    ?? Return if symptoms worsen or fail to improve.           I have discussed the diagnosis with the patient and the intended plan as seen in the above orders.  The patient has received an after-visit summary and questions were answered concerning future plans. Pt conveyed understanding of plan.    Medication Side Effects and Warnings were discussed with patient    An electronic signature was used to authenticate this note  Esau Grew, DO

## 2020-01-02 NOTE — Progress Notes (Signed)
Chief Complaint   Patient presents with   ??? Labs     Fasting     Pt presents in office today for fasting labs.     Pt expresses wanting a bone density test.     Pt states she was in [facility] of Oct 2020, for abdominal pain.  Was not admitted, but stopped taking metformin and has since had relief.     Pt also recently stopped taking Effexor.         1. Have you been to the ER, urgent care clinic since your last visit?  Hospitalized since your last visit?No    2. Have you seen or consulted any other health care providers outside of the [facility] since your last visit?  Include any pap smears or colon screening. No

## 2020-01-02 NOTE — Patient Instructions (Addendum)
A Healthy Lifestyle: Care Instructions  Your Care Instructions     A healthy lifestyle can help you feel good, stay at a healthy weight, and have plenty of energy for both work and play. A healthy lifestyle is something you can share with your whole family.  A healthy lifestyle also can lower your risk for serious health problems, such as high blood pressure, heart disease, and diabetes.  You can follow a few steps listed below to improve your health and the health of your family.  Follow-up care is a key part of your treatment and safety. Be sure to make and go to all appointments, and call your doctor if you are having problems. It's also a good idea to know your test results and keep a list of the medicines you take.  How can you care for yourself at home?  ?? Do not eat too much sugar, fat, or fast foods. You can still have dessert and treats now and then. The goal is moderation.  ?? Start small to improve your eating habits. Pay attention to portion sizes, drink less juice and soda pop, and eat more fruits and vegetables.  ? Eat a healthy amount of food. A 3-ounce serving of meat, for example, is about the size of a deck of cards. Fill the rest of your plate with vegetables and whole grains.  ? Limit the amount of soda and sports drinks you have every day. Drink more water when you are thirsty.  ? Eat at least 5 servings of fruits and vegetables every day. It may seem like a lot, but it is not hard to reach this goal. A serving or helping is 1 piece of fruit, 1 cup of vegetables, or 2 cups of leafy, raw vegetables. Have an apple or some carrot sticks as an afternoon snack instead of a candy bar. Try to have fruits and/or vegetables at every meal.  ?? Make exercise part of your daily routine. You may want to start with simple activities, such as walking, bicycling, or slow swimming. Try to be active 30 to 60 minutes every day. You do not need to do all 30 to 60 minutes all at once. For example, you can exercise 3  times a day for 10 or 20 minutes. Moderate exercise is safe for most people, but it is always a good idea to talk to your doctor before starting an exercise program.  ?? Keep moving. Mow the lawn, work in the garden, or [employer]. Take the stairs instead of the elevator at work.  ?? If you smoke, quit. People who smoke have an increased risk for heart attack, stroke, cancer, and other lung illnesses. Quitting is hard, but there are ways to boost your chance of quitting tobacco for good.  ? Use nicotine gum, patches, or lozenges.  ? Ask your doctor about stop-smoking programs and medicines.  ? Keep trying.  In addition to reducing your risk of diseases in the future, you will notice some benefits soon after you stop using tobacco. If you have shortness of breath or asthma symptoms, they will likely get better within a few weeks after you quit.  ?? Limit how much alcohol you drink. Moderate amounts of alcohol (up to 2 drinks a day for men, 1 drink a day for women) are okay. But drinking too much can lead to liver problems, high blood pressure, and other health problems.  Family health  If you have a family, there are many things  you can do together to improve your health.  ?? Eat meals together as a family as often as possible.  ?? Eat healthy foods. This includes fruits, vegetables, lean meats and dairy, and whole grains.  ?? Include your family in your fitness plan. Most people think of activities such as jogging or tennis as the way to fitness, but there are many ways you and your family can be more active. Anything that makes you breathe hard and gets your heart pumping is exercise. Here are some tips:  ? Walk to do errands or to take your child to school or the bus.  ? Go for a family bike ride after dinner instead of watching TV.  Where can you learn more?  Go to http://[facility]-rivera.info/  Enter [phone] in the search box to learn more about "A Healthy Lifestyle: Care Instructions."  Current as  of: November 16, 2018??????????????????????????????Content Version: 12.6  ?? 2006-2020 [employer].   Care instructions adapted under license by [employer] (which disclaims liability or warranty for this information). If you have questions about a medical condition or this instruction, always ask your healthcare professional. [place] any warranty or liability for your use of this information.

## 2020-01-06 ENCOUNTER — Other Ambulatory Visit: Payer: Self-pay

## 2020-01-06 ENCOUNTER — Inpatient Hospital Stay: Payer: Medicare Other

## 2020-01-06 VITALS — BP 147/88 | HR 74 | Temp 98.0°F | Resp 16

## 2020-01-06 DIAGNOSIS — Z7189 Other specified counseling: Secondary | ICD-10-CM

## 2020-01-06 DIAGNOSIS — C5702 Malignant neoplasm of left fallopian tube: Secondary | ICD-10-CM | POA: Diagnosis not present

## 2020-01-06 DIAGNOSIS — Z5111 Encounter for antineoplastic chemotherapy: Secondary | ICD-10-CM | POA: Diagnosis not present

## 2020-01-06 DIAGNOSIS — Z95828 Presence of other vascular implants and grafts: Secondary | ICD-10-CM

## 2020-01-06 LAB — HEPATITIS C ANTIBODY: Hepatitis C Ab: NONREACTIVE

## 2020-01-06 LAB — COMPREHENSIVE METABOLIC PANEL
ALT: 32 U/L (ref 12–78)
AST: 15 U/L (ref 15–37)
Albumin/Globulin Ratio: 1 — ABNORMAL LOW (ref 1.1–2.2)
Albumin: 3.4 g/dL — ABNORMAL LOW (ref 3.5–5.0)
Alkaline Phosphatase: 64 U/L (ref 45–117)
Anion Gap: 6 mmol/L (ref 5–15)
BUN: 20 MG/DL (ref 6–20)
Bun/Cre Ratio: 32 — ABNORMAL HIGH (ref 12–20)
CO2: 27 mmol/L (ref 21–32)
Calcium: 9.2 MG/DL (ref 8.5–10.1)
Chloride: 107 mmol/L (ref 97–108)
Creatinine: 0.63 MG/DL (ref 0.55–1.02)
EGFR IF NonAfrican American: >60 mL/min/{1.73_m2} (ref >60)
GFR African American: >60 mL/min/{1.73_m2} (ref >60)
Globulin: 3.5 g/dL (ref 2.0–4.0)
Glucose: 136 mg/dL — ABNORMAL HIGH (ref 65–100)
Potassium: 4 mmol/L (ref 3.5–5.1)
Sodium: 140 mmol/L (ref 136–145)
Total Bilirubin: 0.3 MG/DL (ref 0.2–1.0)
Total Protein: 6.9 g/dL (ref 6.4–8.2)

## 2020-01-06 LAB — LIPID PANEL
CHOL/HDL Ratio: 4.4 (ref 0.0–5.0)
Chol/HDL Ratio: 4.4 (ref 0.0–5.0)
Cholesterol, Total: 176 MG/DL (ref <200)
Cholesterol, total: 176 MG/DL (ref <200)
HDL Cholesterol: 40 MG/DL
HDL: 40 MG/DL
LDL Calculated: 58.2 MG/DL (ref 0–100)
LDL, calculated: 58.2 MG/DL (ref 0–100)
Triglyceride: 389 MG/DL — ABNORMAL HIGH (ref <150)
Triglycerides: 389 MG/DL — ABNORMAL HIGH (ref <150)
VLDL Cholesterol Calculated: 77.8 MG/DL
VLDL, calculated: 77.8 MG/DL

## 2020-01-06 LAB — HEMOGLOBIN A1C W/EAG
Hemoglobin A1C: 6.8 % — ABNORMAL HIGH (ref 4.0–5.6)
eAG: 148 mg/dL

## 2020-01-06 LAB — METABOLIC PANEL, COMPREHENSIVE
A-G Ratio: 1 — ABNORMAL LOW (ref 1.1–2.2)
ALT (SGPT): 32 U/L (ref 12–78)
AST (SGOT): 15 U/L (ref 15–37)
Albumin: 3.4 g/dL — ABNORMAL LOW (ref 3.5–5.0)
Alk. phosphatase: 64 U/L (ref 45–117)
Anion gap: 6 mmol/L (ref 5–15)
BUN/Creatinine ratio: 32 — ABNORMAL HIGH (ref 12–20)
BUN: 20 MG/DL (ref 6–20)
Bilirubin, total: 0.3 MG/DL (ref 0.2–1.0)
CO2: 27 mmol/L (ref 21–32)
Calcium: 9.2 MG/DL (ref 8.5–10.1)
Chloride: 107 mmol/L (ref 97–108)
Creatinine: 0.63 MG/DL (ref 0.55–1.02)
GFR est AA: >60 mL/min/{1.73_m2} (ref >60)
GFR est non-AA: >60 mL/min/{1.73_m2} (ref >60)
Globulin: 3.5 g/dL (ref 2.0–4.0)
Glucose: 136 mg/dL — ABNORMAL HIGH (ref 65–100)
Potassium: 4 mmol/L (ref 3.5–5.1)
Protein, total: 6.9 g/dL (ref 6.4–8.2)
Sodium: 140 mmol/L (ref 136–145)

## 2020-01-06 LAB — HEPATITIS C AB: Hep C virus Ab Interp.: NONREACTIVE

## 2020-01-06 LAB — HEMOGLOBIN A1C WITH EAG
Est. average glucose: 148 mg/dL
Hemoglobin A1c: 6.8 % — ABNORMAL HIGH (ref 4.0–5.6)

## 2020-01-06 LAB — CBC WITH DIFFERENTIAL (CANCER CENTER ONLY)
Abs Immature Granulocytes: 0.03 10*3/uL (ref 0.00–0.07)
Basophils Absolute: 0 10*3/uL (ref 0.0–0.1)
Basophils Relative: 0 %
Eosinophils Absolute: 0.3 10*3/uL (ref 0.0–0.5)
Eosinophils Relative: 5 %
HCT: 33.2 % — ABNORMAL LOW (ref 36.0–46.0)
Hemoglobin: 10.5 g/dL — ABNORMAL LOW (ref 12.0–15.0)
Immature Granulocytes: 1 %
Lymphocytes Relative: 23 %
Lymphs Abs: 1.2 10*3/uL (ref 0.7–4.0)
MCH: 30.5 pg (ref 26.0–34.0)
MCHC: 31.6 g/dL (ref 30.0–36.0)
MCV: 96.5 fL (ref 80.0–100.0)
Monocytes Absolute: 0.7 10*3/uL (ref 0.1–1.0)
Monocytes Relative: 14 %
Neutro Abs: 2.9 10*3/uL (ref 1.7–7.7)
Neutrophils Relative %: 57 %
Platelet Count: 185 10*3/uL (ref 150–400)
RBC: 3.44 MIL/uL — ABNORMAL LOW (ref 3.87–5.11)
RDW: 14.8 % (ref 11.5–15.5)
WBC Count: 5.1 10*3/uL (ref 4.0–10.5)
nRBC: 0 % (ref 0.0–0.2)

## 2020-01-06 LAB — CMP (CANCER CENTER ONLY)
ALT: 33 U/L (ref 0–44)
AST: 24 U/L (ref 15–41)
Albumin: 3.4 g/dL — ABNORMAL LOW (ref 3.5–5.0)
Alkaline Phosphatase: 67 U/L (ref 38–126)
Anion gap: 10 (ref 5–15)
BUN: 15 mg/dL (ref 8–23)
CO2: 24 mmol/L (ref 22–32)
Calcium: 8.8 mg/dL — ABNORMAL LOW (ref 8.9–10.3)
Chloride: 105 mmol/L (ref 98–111)
Creatinine: 0.77 mg/dL (ref 0.44–1.00)
GFR, Est AFR Am: >60 mL/min (ref >60)
GFR, Estimated: >60 mL/min (ref >60)
Glucose, Bld: 111 mg/dL — ABNORMAL HIGH (ref 70–99)
Potassium: 3.9 mmol/L (ref 3.5–5.1)
Sodium: 139 mmol/L (ref 135–145)
Total Bilirubin: <0.2 mg/dL — ABNORMAL LOW (ref 0.3–1.2)
Total Protein: 6.3 g/dL — ABNORMAL LOW (ref 6.5–8.1)

## 2020-01-06 MED ORDER — SODIUM CHLORIDE 0.9 % IV SOLN
600.0000 mg/m2 | Freq: Once | INTRAVENOUS | Status: AC
  Administered 2020-01-06: 1026 mg via INTRAVENOUS
  Filled 2020-01-06: qty 26.98

## 2020-01-06 MED ORDER — PROCHLORPERAZINE MALEATE 10 MG PO TABS
ORAL_TABLET | ORAL | Status: AC
  Filled 2020-01-06: qty 1

## 2020-01-06 MED ORDER — SODIUM CHLORIDE 0.9% FLUSH
10.0000 mL | Freq: Once | INTRAVENOUS | Status: AC
  Administered 2020-01-06: 10 mL
  Filled 2020-01-06: qty 10

## 2020-01-06 MED ORDER — SODIUM CHLORIDE 0.9% FLUSH
10.0000 mL | INTRAVENOUS | Status: DC | PRN
  Administered 2020-01-06: 10 mL
  Filled 2020-01-06: qty 10

## 2020-01-06 MED ORDER — HEPARIN SOD (PORK) LOCK FLUSH 100 UNIT/ML IV SOLN
500.0000 [IU] | Freq: Once | INTRAVENOUS | Status: AC | PRN
  Administered 2020-01-06: 16:00:00 500 [IU]
  Filled 2020-01-06: qty 5

## 2020-01-06 MED ORDER — SODIUM CHLORIDE 0.9 % IV SOLN
Freq: Once | INTRAVENOUS | Status: AC
  Filled 2020-01-06: qty 250

## 2020-01-06 MED ORDER — PROCHLORPERAZINE MALEATE 10 MG PO TABS
10.0000 mg | ORAL_TABLET | Freq: Once | ORAL | Status: AC
  Administered 2020-01-06: 10 mg via ORAL

## 2020-01-06 NOTE — Patient Instructions (Signed)
[facility] Discharge Instructions for Patients Receiving Chemotherapy  Today you received the following chemotherapy agents: gemcitabine.  To help prevent nausea and vomiting after your treatment, we encourage you to take your nausea medication as directed.   If you develop nausea and vomiting that is not controlled by your nausea medication, call the clinic.   BELOW ARE SYMPTOMS THAT SHOULD BE REPORTED IMMEDIATELY:  *FEVER GREATER THAN 100.5 F  *CHILLS WITH OR WITHOUT FEVER  NAUSEA AND VOMITING THAT IS NOT CONTROLLED WITH YOUR NAUSEA MEDICATION  *UNUSUAL SHORTNESS OF BREATH  *UNUSUAL BRUISING OR BLEEDING  TENDERNESS IN MOUTH AND THROAT WITH OR WITHOUT PRESENCE OF ULCERS  *URINARY PROBLEMS  *BOWEL PROBLEMS  UNUSUAL RASH Items with * indicate a potential emergency and should be followed up as soon as possible.  Feel free to call the clinic should you have any questions or concerns. The clinic phone number is [phone].  Please show the CHEMO ALERT CARD at check-in to the Emergency Department and triage nurse.   

## 2020-01-08 ENCOUNTER — Encounter

## 2020-01-14 NOTE — Progress Notes (Signed)
Pharmacist Chemotherapy Monitoring - Follow Up Assessment    I verify that I have reviewed each item in the below checklist:  . Regimen for the patient is scheduled for the appropriate day and plan matches scheduled date. Marland Kitchen Appropriate non-routine labs are ordered dependent on drug ordered. . If applicable, additional medications reviewed and ordered per protocol based on lifetime cumulative doses and/or treatment regimen.   Plan for follow-up and/or issues identified: No . I-vent associated with next due treatment: No . MD and/or nursing notified: No  Jessica Johnston 01/14/2020 1:07 PM

## 2020-01-20 ENCOUNTER — Inpatient Hospital Stay: Payer: Medicare Other

## 2020-01-20 ENCOUNTER — Other Ambulatory Visit: Payer: Self-pay

## 2020-01-20 ENCOUNTER — Encounter: Payer: Self-pay | Admitting: Hematology and Oncology

## 2020-01-20 ENCOUNTER — Inpatient Hospital Stay: Payer: Medicare Other | Attending: Gynecologic Oncology | Admitting: Hematology and Oncology

## 2020-01-20 VITALS — BP 127/60 | HR 66 | Temp 98.0°F | Resp 18 | Ht 64.0 in | Wt 146.0 lb

## 2020-01-20 DIAGNOSIS — Z5111 Encounter for antineoplastic chemotherapy: Secondary | ICD-10-CM | POA: Insufficient documentation

## 2020-01-20 DIAGNOSIS — D6481 Anemia due to antineoplastic chemotherapy: Secondary | ICD-10-CM

## 2020-01-20 DIAGNOSIS — C5702 Malignant neoplasm of left fallopian tube: Secondary | ICD-10-CM

## 2020-01-20 DIAGNOSIS — C5700 Malignant neoplasm of unspecified fallopian tube: Secondary | ICD-10-CM | POA: Diagnosis not present

## 2020-01-20 DIAGNOSIS — Z7189 Other specified counseling: Secondary | ICD-10-CM

## 2020-01-20 DIAGNOSIS — T451X5A Adverse effect of antineoplastic and immunosuppressive drugs, initial encounter: Secondary | ICD-10-CM | POA: Diagnosis not present

## 2020-01-20 DIAGNOSIS — Z79899 Other long term (current) drug therapy: Secondary | ICD-10-CM | POA: Diagnosis not present

## 2020-01-20 DIAGNOSIS — Z95828 Presence of other vascular implants and grafts: Secondary | ICD-10-CM

## 2020-01-20 LAB — CBC WITH DIFFERENTIAL (CANCER CENTER ONLY)
Abs Immature Granulocytes: 0.03 10*3/uL (ref 0.00–0.07)
Basophils Absolute: 0 10*3/uL (ref 0.0–0.1)
Basophils Relative: 0 %
Eosinophils Absolute: 0.1 10*3/uL (ref 0.0–0.5)
Eosinophils Relative: 3 %
HCT: 34.4 % — ABNORMAL LOW (ref 36.0–46.0)
Hemoglobin: 11.1 g/dL — ABNORMAL LOW (ref 12.0–15.0)
Immature Granulocytes: 1 %
Lymphocytes Relative: 18 %
Lymphs Abs: 0.8 10*3/uL (ref 0.7–4.0)
MCH: 31.1 pg (ref 26.0–34.0)
MCHC: 32.3 g/dL (ref 30.0–36.0)
MCV: 96.4 fL (ref 80.0–100.0)
Monocytes Absolute: 0.7 10*3/uL (ref 0.1–1.0)
Monocytes Relative: 16 %
Neutro Abs: 2.7 10*3/uL (ref 1.7–7.7)
Neutrophils Relative %: 62 %
Platelet Count: 203 10*3/uL (ref 150–400)
RBC: 3.57 MIL/uL — ABNORMAL LOW (ref 3.87–5.11)
RDW: 14.6 % (ref 11.5–15.5)
WBC Count: 4.4 10*3/uL (ref 4.0–10.5)
nRBC: 0 % (ref 0.0–0.2)

## 2020-01-20 LAB — CMP (CANCER CENTER ONLY)
ALT: 28 U/L (ref 0–44)
AST: 20 U/L (ref 15–41)
Albumin: 3.4 g/dL — ABNORMAL LOW (ref 3.5–5.0)
Alkaline Phosphatase: 67 U/L (ref 38–126)
Anion gap: 11 (ref 5–15)
BUN: 14 mg/dL (ref 8–23)
CO2: 22 mmol/L (ref 22–32)
Calcium: 8.8 mg/dL — ABNORMAL LOW (ref 8.9–10.3)
Chloride: 107 mmol/L (ref 98–111)
Creatinine: 0.78 mg/dL (ref 0.44–1.00)
GFR, Est AFR Am: >60 mL/min (ref >60)
GFR, Estimated: >60 mL/min (ref >60)
Glucose, Bld: 96 mg/dL (ref 70–99)
Potassium: 4.5 mmol/L (ref 3.5–5.1)
Sodium: 140 mmol/L (ref 135–145)
Total Bilirubin: 0.2 mg/dL — ABNORMAL LOW (ref 0.3–1.2)
Total Protein: 6.5 g/dL (ref 6.5–8.1)

## 2020-01-20 MED ORDER — HEPARIN SOD (PORK) LOCK FLUSH 100 UNIT/ML IV SOLN
500.0000 [IU] | Freq: Once | INTRAVENOUS | Status: AC | PRN
  Administered 2020-01-20: 500 [IU]
  Filled 2020-01-20: qty 5

## 2020-01-20 MED ORDER — PROCHLORPERAZINE MALEATE 10 MG PO TABS
ORAL_TABLET | ORAL | Status: AC
  Filled 2020-01-20: qty 1

## 2020-01-20 MED ORDER — SODIUM CHLORIDE 0.9 % IV SOLN
Freq: Once | INTRAVENOUS | Status: AC
  Filled 2020-01-20: qty 250

## 2020-01-20 MED ORDER — SODIUM CHLORIDE 0.9% FLUSH
10.0000 mL | Freq: Once | INTRAVENOUS | Status: AC
  Administered 2020-01-20: 11:00:00 10 mL
  Filled 2020-01-20: qty 10

## 2020-01-20 MED ORDER — SODIUM CHLORIDE 0.9 % IV SOLN
600.0000 mg/m2 | Freq: Once | INTRAVENOUS | Status: AC
  Administered 2020-01-20: 1026 mg via INTRAVENOUS
  Filled 2020-01-20: qty 26.98

## 2020-01-20 MED ORDER — SODIUM CHLORIDE 0.9% FLUSH
10.0000 mL | INTRAVENOUS | Status: DC | PRN
  Administered 2020-01-20: 10 mL
  Filled 2020-01-20: qty 10

## 2020-01-20 MED ORDER — PROCHLORPERAZINE MALEATE 10 MG PO TABS
10.0000 mg | ORAL_TABLET | Freq: Once | ORAL | Status: AC
  Administered 2020-01-20: 10 mg via ORAL

## 2020-01-20 NOTE — Assessment & Plan Note (Signed)
This is likely due to recent treatment.  She is not symptomatic We will continue treatment as scheduled 

## 2020-01-20 NOTE — Patient Instructions (Signed)
[facility] Discharge Instructions for Patients Receiving Chemotherapy  Today you received the following chemotherapy agents: gemcitabine.  To help prevent nausea and vomiting after your treatment, we encourage you to take your nausea medication as directed.   If you develop nausea and vomiting that is not controlled by your nausea medication, call the clinic.   BELOW ARE SYMPTOMS THAT SHOULD BE REPORTED IMMEDIATELY:  *FEVER GREATER THAN 100.5 F  *CHILLS WITH OR WITHOUT FEVER  NAUSEA AND VOMITING THAT IS NOT CONTROLLED WITH YOUR NAUSEA MEDICATION  *UNUSUAL SHORTNESS OF BREATH  *UNUSUAL BRUISING OR BLEEDING  TENDERNESS IN MOUTH AND THROAT WITH OR WITHOUT PRESENCE OF ULCERS  *URINARY PROBLEMS  *BOWEL PROBLEMS  UNUSUAL RASH Items with * indicate a potential emergency and should be followed up as soon as possible.  Feel free to call the clinic should you have any questions or concerns. The clinic phone number is [phone].  Please show the CHEMO ALERT CARD at check-in to the Emergency Department and triage nurse.   

## 2020-01-20 NOTE — Assessment & Plan Note (Signed)
She has minimum side effects from treatment with recent modified schedule  Tumor marker is stable I plan to order CT imaging before I see her next month for further follow-up

## 2020-01-20 NOTE — Progress Notes (Signed)
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  She is not symptomatic We will continue treatment as scheduled   Orders Placed This Encounter  Procedures  . CT ABDOMEN PELVIS W CONTRAST    Standing Status:   Future    Standing Expiration Date:   01/19/2021    Order Specific Question:   If indicated for the ordered procedure, I authorize the administration of contrast media per Radiology protocol    Answer:   Yes    Order Specific Question:   Preferred imaging location?    Answer:   [facility]    Order Specific Question:   Radiology Contrast Protocol - do NOT remove file path    Answer:   [email or web address]  . Comprehensive metabolic panel    Standing Status:   Standing    Number of Occurrences:   22    Standing Expiration Date:   01/19/2021  . CBC with Differential/Platelet    Standing Status:   Standing    Number of Occurrences:   22    Standing Expiration Date:   01/19/2021    All questions were answered. The patient knows to call the clinic with any problems, questions or concerns. The total time spent in the appointment was 20 minutes encounter with patients including review of chart and various tests results, discussions about plan of care and coordination of care plan   [name], MD 01/20/2020 11:54 AM  INTERVAL HISTORY: Please see below for problem oriented charting. 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 Extensive omental nodularity highly worrisome for peritoneal carcinomatosis. Late recurrence of melanoma can present as peritoneal carcinomatosis. Ovarian cancer more commonly presents in this manner. Patient does have a predominately low density right adnexal lesion measuring up to 3.5 cm, although this lesion is not typical for ovarian cancer. 2. No evidence of bowel or ureteral obstruction. 3. No other definite signs of metastatic disease. There are small indeterminate low-density hepatic lesions.   02/08/2016 Tumor Marker   Patient's tumor was tested for the following markers: CA-125 Results of the tumor marker test revealed 656.2   02/15/2016 Procedure   CT-guided core biopsy performed of omental mass just deep to the abdominal wall.   02/15/2016 Pathology Results   Omentum, biopsy, left - PAPILLARY SEROUS NEOPLASM, SEE COMMENT. Microscopic Comment There are papillary collections of largely low grade appearing cells with numerous psammoma bodies. Given the limited material it is difficult to distinguish between invasive implants of a serous borderline tumor and serous carcinoma, especially given the low grade  appearance.   02/18/2016 Pathology Results   1. Omentum, resection for tumor INVASIVE IMPLANT OF HIGH GRADE SEROUS CARCINOMA 2. Uterus +/- tubes/ovaries, neoplastic HIGH GRADE SEROUS CARCINOMA INVOLVING LEFT TUBAL FIMBRIA SEROUS CARCINOMA WITH PREDOMINANT PSAMMOMA BODIES IMPLANT AT UTERUS SEROSA, BILATERAL FALLOPIAN TUBAL SEROSA, AND  ANTERIOR PERITONEAL REFLECTION CERVIX: HISTOLOGICAL UNREMARKABLE ENDOMETRIUM: INACTIVE ENDOMETRIUM MYOMETRIUM: LEIOMYOMA LEFT OVARY: CYSTADENOFIBROMA RIGHT OVARY AND FALLOPIAN TUBE: HISTOLOGICAL UNREMARKABLE Microscopic Comment 2. ONCOLOGY TABLE - FALLOPIAN TUBE 1. Specimen, including laterality: Omentum, uterus, bilateral ovaries and fallopian tubes 2. Procedure: Hysterectomy, bilateral salpingo-oophorectomy and tumor debulking omentectomy 3. Lymph node sampling performed: No 4. Tumor site: uterus serosa, bilateral fallopian tubal serosa, peritoneal and omentum 5. Tumor location in fallopian tube: Left fallopian tubal fimbria 6. Specimen integrity (intact/ruptured/disrupted): Intact 7. Tumor size (cm): multi focal invasive omentum implant greater than 2 cm, left fallopian tube tumor 0.8 cm. 8. Histologic type: Serous carcinoma 9. Grade: 3 10. Microscopic tumor extension: uterus serosa, bilateral fallopian tubal serosa, peritoneal and omentum 11. Margins: NA 12. Lymph-Vascular invasion: identified 13. Lymph nodes: # examined: 0; # positive: NA 14. TNM: pT3c, pNx 15. FIGO Stage (based on pathologic findings, needs clinical correlation: IIIC 16. Comment: High grade serous carcinoma multifocally and extensively involves left fallopian tubal fimbria, omentum, uterus serosa, bilateral fallopian tubal serosa and peritoneal. At the left fallopian tube there are small foci of serous tubal intraepithelial carcinoma identified, so we conclude the carcinoma is fallopian tube origin   03/09/2016 Tumor Marker   Patient's tumor was tested for the following markers: CA-125 Results of the tumor marker test revealed 154.4   03/15/2016 Procedure   Technically successful right IJ power-injectable port catheter placement. Ready for routine use.   03/18/2016 - 07/13/2016 Chemotherapy   The patient had 6 cycles of carboplatin and taxol   04/14/2016 Tumor Marker   Patient's tumor was tested for the following  markers: CA-125 Results of the tumor marker test revealed 36.7   04/25/2016 Genetic Testing   Patient has genetic testing done for germline mutation Results revealed patient has no mutation   04/28/2016 Tumor Marker   Patient's tumor was tested for the following markers: CA-125 Results of the tumor marker test revealed 24.6   06/21/2016 Tumor Marker   Patient's tumor was tested for the following markers: CA-125 Results of the tumor marker test revealed 16.6   08/01/2016 Tumor Marker   Patient's tumor was tested for the following markers: CA-125 Results of the tumor marker test revealed 17.2   08/05/2016 Imaging   Interval TAH-BSO. No evidence of residual pelvic mass or metastatic disease within the abdomen or pelvis. No other acute findings.    11/04/2016 Tumor Marker   Patient's tumor was tested for the following markers: CA-125 Results of the tumor marker test revealed 13.6   01/20/2017 Tumor Marker   Patient's tumor was tested for the following markers: CA-125 Results of the tumor marker test revealed 13.8   04/14/2017 Tumor Marker   Patient's tumor was tested for the following markers: CA-125 Results of the tumor marker test revealed 16.1   06/13/2017 Imaging   No acute findings.  No mass or hernia identified.  Colonic diverticulosis, without radiographic evidence of diverticulitis.  Aortic atherosclerosis.   07/03/2017 Tumor Marker   Patient's tumor was tested for the following markers: CA-125 Results of the tumor marker test revealed 18.4   09/19/2017 Tumor Marker   Patient's tumor was tested for the following markers: CA-125 Results of the tumor marker test revealed 18.5  01/23/2018 Tumor Marker   Patient's tumor was tested for the following markers: CA-125 Results of the tumor marker test revealed 35.4   01/30/2018 Imaging   1. No evidence of local fallopian tube carcinoma recurrence within the pelvis. Post hysterectomy. 2. No evidence of metastatic peritoneal  disease or omental disease. No solid organ metastasis.    03/20/2018 Tumor Marker   Patient's tumor was tested for the following markers: CA-125 Results of the tumor marker test revealed 73   05/02/2018 Tumor Marker   Patient's tumor was tested for the following markers: CA-125 Results of the tumor marker test revealed 127.1   05/08/2018 Imaging   CT abdomen and pelvis 1. 7 cm left subdiaphragmatic collection of loculated fluid versus low-density soft tissue. Given the patient's history of rising CA 125, metastatic disease considered highly likely.  2. Areas of fluid or recurrent disease identified in the right lower quadrant adjacent to the cecum and along right lower quadrant small bowel loops.   05/14/2018 Tumor Marker   Patient's tumor was tested for the following markers: CA-125 Results of the tumor marker test revealed 166    Genetic Testing   Patient has genetic testing done for ER/PR and MMR. Results revealed patient has the following on 02/18/2016 surgical pathology: ER 20%, PR 0% MMR: normal    Genetic Testing   Patient has genetic testing done for MSI. Results revealed patient has the following: MSI stable   05/18/2018 - 12/31/2018 Chemotherapy   The patient had carboplatin and Doxil   07/16/2018 Tumor Marker   Patient's tumor was tested for the following markers: CA-125 Results of the tumor marker test revealed 28.7   08/10/2018 Imaging   CT imaging:  Decreased size of peritoneal soft tissue masses in left upper quadrant, consistent with decreased metastatic disease.  No new or progressive metastatic disease. No other acute findings.  Colonic diverticulosis, without radiographic evidence of diverticulitis.  Stable small paraumbilical ventral hernia containing transverse colon.   08/13/2018 Tumor Marker   Patient's tumor was tested for the following markers: CA-125 Results of the tumor marker test revealed 21.6   08/24/2018 Echocardiogram   LV EF: 60% -   65%   10/15/2018 Tumor Marker   Patient's tumor was tested for the following markers: CA-125 Results of the tumor marker test revealed 20.1   10/19/2018 Imaging   Ct scan of abdomen and pelvis Status post hysterectomy, bilateral salpingo-oophorectomy, and omentectomy.  Two peritoneal implants in the left upper abdomen are stable versus mildly decreased, as above. No abdominopelvic ascites.  No evidence of new/progressive metastatic disease.     11/19/2018 Tumor Marker   Patient's tumor was tested for the following markers: CA-125 Results of the tumor marker test revealed 21.7   11/23/2018 Echocardiogram   1. The left ventricle has normal systolic function of 35-70%. The cavity size was normal. There is no increased left ventricular wall thickness. Echo evidence of impaired diastolic relaxation.  2. The right ventricle has normal systolic function. The cavity was normal. There is no increase in right ventricular wall thickness.  3. The mitral valve is normal in structure. There is mild mitral annular calcification present.  4. The tricuspid valve is normal in structure.  5. The aortic valve is tricuspid There is mild thickening of the aortic valve.  6. The pulmonic valve was normal in structure. Pulmonic valve regurgitation is mild by color flow Doppler.  7. Normal LV systolic function; mild diastolic dysfunction.   12/31/2018 Tumor Marker  Patient's tumor was tested for the following markers: CA-125 Results of the tumor marker test revealed 17.1   01/31/2019 Imaging   1. Left upper quadrant peritoneal implants described previously have decreased in the interval. No new or progressive findings in the abdomen/pelvis today. No free fluid. 2. Right paraumbilical ventral hernia contains a small knuckle of transverse colon without complicating features. 3.  Aortic Atherosclerois (ICD10-170.0)  Aortic Atherosclerosis (ICD10-I70.0).   01/31/2019 Tumor Marker   Patient's tumor was  tested for the following markers: CA-125 Results of the tumor marker test revealed 18.9   02/11/2019 - 03/13/2019 Chemotherapy   The patient is taking Niraparib. She self discontinued after 1 month   02/25/2019 Tumor Marker   Patient's tumor was tested for the following markers: CA-125 Results of the tumor marker test revealed 18.6   05/13/2019 Tumor Marker   Patient's tumor was tested for the following markers: CA-125 Results of the tumor marker test revealed 230   05/22/2019 Imaging   1. Increased size of 4.6 cm soft tissue mass in the gastrosplenic ligament, consistent with metastatic disease. 2. No other sites of metastatic disease identified within the abdomen or pelvis. 3. Colonic diverticulosis. No radiographic evidence of diverticulitis. 4. Stable small right paraumbilical hernia containing transverse colon.   Aortic Atherosclerosis (ICD10-I70.0).   05/31/2019 - 08/22/2019 Chemotherapy   The patient had bevacizumab and Taxol for chemotherapy treatment.     05/31/2019 Tumor Marker   Patient's tumor was tested for the following markers: CA-125 Results of the tumor marker test revealed 84   06/21/2019 Tumor Marker   Patient's tumor was tested for the following markers: CA-125 Results of the tumor marker test revealed 49.4.   07/19/2019 Tumor Marker   Patient's tumor was tested for the following markers: CA-125. Results of the tumor marker test revealed 44.5   08/22/2019 Imaging   1. Increase in size of 5.0 cm mass in the gastro splenic ligament consistent with metastatic disease. 2. No additional sites of disease identified within the abdomen or pelvis. 3. Unchanged paraumbilical hernia containing nonobstructed loop of large bowel. 4.  Aortic Atherosclerosis (ICD10-I70.0).     08/30/2019 Tumor Marker   Patient's tumor was tested for the following markers: CA-125 Results of the tumor marker test revealed 71.5   08/30/2019 -  Chemotherapy   The patient had gemzar for  chemotherapy treatment.     09/27/2019 Tumor Marker   Patient's tumor was tested for the following markers: CA-125. Results of the tumor marker test revealed 38.5   10/14/2019 Tumor Marker   Patient's tumor was tested for the following markers: CA-125 Results of the tumor marker test revealed 40.8   10/21/2019 Tumor Marker   Patient's tumor was tested for the following markers: CA-125 Results of the tumor marker test revealed 34.8.   11/22/2019 Imaging   1. Interval decrease in size of left upper quadrant cystic and solid peritoneal lesion. 2. No new sites of disease. 3. Unchanged periumbilical hernia containing a nonobstructed loop of small bowel. 4.  Aortic Atherosclerosis (ICD10-I70.0).     11/25/2019 Tumor Marker   Patient's tumor was tested for the following markers: CA-125 Results of the tumor marker test revealed 22.7   12/23/2019 Tumor Marker   Patient's tumor was tested for the following markers: CA-125 Results of the tumor marker test revealed 26.3     REVIEW OF SYSTEMS:   Constitutional: Denies fevers, chills or abnormal weight loss Eyes: Denies blurriness of vision Ears, nose, mouth, throat, and face: Denies  mucositis or sore throat Respiratory: Denies cough, dyspnea or wheezes Cardiovascular: Denies palpitation, chest discomfort or lower extremity swelling Gastrointestinal:  Denies nausea, heartburn or change in bowel habits Skin: Denies abnormal skin rashes Lymphatics: Denies new lymphadenopathy or easy bruising Neurological:Denies numbness, tingling or new weaknesses Behavioral/Psych: Mood is stable, no new changes  All other systems were reviewed with the patient and are negative.  I have reviewed the past medical history, past surgical history, social history and family history with the patient and they are unchanged from previous note.  ALLERGIES:  has No Known Allergies.  MEDICATIONS:  Current Outpatient Medications  Medication Sig Dispense Refill  .  Cholecalciferol (VITAMIN D) 2000 units CAPS Take 1 capsule by mouth daily.    Marland Kitchen ezetimibe (ZETIA) 10 MG tablet Take 10 mg by mouth daily.    Marland Kitchen lidocaine-prilocaine (EMLA) cream Apply to affected area once 30 g 3  . morphine (MSIR) 15 MG tablet Take 1 tablet (15 mg total) by mouth every 4 (four) hours as needed for severe pain. 30 tablet 0  . ondansetron (ZOFRAN) 8 MG tablet Take 1 tablet (8 mg total) by mouth 2 (two) times daily as needed (Nausea or vomiting). 30 tablet 1  . prochlorperazine (COMPAZINE) 10 MG tablet Take 1 tablet (10 mg total) by mouth every 6 (six) hours as needed (Nausea or vomiting). 30 tablet 1   No current facility-administered medications for this visit.   Facility-Administered Medications Ordered in Other Visits  Medication Dose Route Frequency Provider Last Rate Last Admin  . gemcitabine (GEMZAR) 1,026 mg in sodium chloride 0.9 % 250 mL chemo infusion  600 mg/m2 (Treatment Plan Recorded) Intravenous Once Alvy Bimler, Srinidhi Landers, MD      . heparin lock flush 100 unit/mL  500 Units Intracatheter Once PRN Alvy Bimler, Amaani Guilbault, MD      . prochlorperazine (COMPAZINE) tablet 10 mg  10 mg Oral Once Alvy Bimler, Stephane Niemann, MD      . sodium chloride flush (NS) 0.9 % injection 10 mL  10 mL Intracatheter PRN Alvy Bimler, Haskel Dewalt, MD        PHYSICAL EXAMINATION: ECOG PERFORMANCE STATUS: 0 - Asymptomatic  Vitals:   01/20/20 1116  BP: 127/60  Pulse: 66  Resp: 18  Temp: 98 F (36.7 C)  SpO2: 100%   Filed Weights   01/20/20 1116  Weight: 146 lb (66.2 kg)    GENERAL:alert, no distress and comfortable SKIN: skin color, texture, turgor are normal, no rashes or significant lesions EYES: normal, Conjunctiva are pink and non-injected, sclera clear OROPHARYNX:no exudate, no erythema and lips, buccal mucosa, and tongue normal  NECK: supple, thyroid normal size, non-tender, without nodularity LYMPH:  no palpable lymphadenopathy in the cervical, axillary or inguinal LUNGS: clear to auscultation and percussion with  normal breathing effort HEART: regular rate & rhythm and no murmurs and no lower extremity edema ABDOMEN:abdomen soft, non-tender and normal bowel sounds Musculoskeletal:no cyanosis of digits and no clubbing  NEURO: alert & oriented x 3 with fluent speech, no focal motor/sensory deficits  LABORATORY DATA:  I have reviewed the data as listed    Component Value Date/Time   NA 140 01/20/2020 1029   NA 139 06/07/2017 1430   K 4.5 01/20/2020 1029   K 3.8 06/07/2017 1430   CL 107 01/20/2020 1029   CO2 22 01/20/2020 1029   CO2 27 06/07/2017 1430   GLUCOSE 96 01/20/2020 1029   GLUCOSE 124 06/07/2017 1430   BUN 14 01/20/2020 1029   BUN 14.0 06/07/2017 1430  CREATININE 0.78 01/20/2020 1029   CREATININE 0.8 06/07/2017 1430   CALCIUM 8.8 (L) 01/20/2020 1029   CALCIUM 9.3 06/07/2017 1430   PROT 6.5 01/20/2020 1029   PROT 6.6 09/30/2016 1027   ALBUMIN 3.4 (L) 01/20/2020 1029   ALBUMIN 3.5 09/30/2016 1027   AST 20 01/20/2020 1029   AST 18 09/30/2016 1027   ALT 28 01/20/2020 1029   ALT 21 09/30/2016 1027   ALKPHOS 67 01/20/2020 1029   ALKPHOS 77 09/30/2016 1027   BILITOT 0.2 (L) 01/20/2020 1029   BILITOT 0.37 09/30/2016 1027   GFRNONAA >60 01/20/2020 1029   GFRAA >60 01/20/2020 1029    No results found for: SPEP, UPEP  Lab Results  Component Value Date   WBC 4.4 01/20/2020   NEUTROABS 2.7 01/20/2020   HGB 11.1 (L) 01/20/2020   HCT 34.4 (L) 01/20/2020   MCV 96.4 01/20/2020   PLT 203 01/20/2020      Chemistry      Component Value Date/Time   NA 140 01/20/2020 1029   NA 139 06/07/2017 1430   K 4.5 01/20/2020 1029   K 3.8 06/07/2017 1430   CL 107 01/20/2020 1029   CO2 22 01/20/2020 1029   CO2 27 06/07/2017 1430   BUN 14 01/20/2020 1029   BUN 14.0 06/07/2017 1430   CREATININE 0.78 01/20/2020 1029   CREATININE 0.8 06/07/2017 1430      Component Value Date/Time   CALCIUM 8.8 (L) 01/20/2020 1029   CALCIUM 9.3 06/07/2017 1430   ALKPHOS 67 01/20/2020 1029   ALKPHOS 77  09/30/2016 1027   AST 20 01/20/2020 1029   AST 18 09/30/2016 1027   ALT 28 01/20/2020 1029   ALT 21 09/30/2016 1027   BILITOT 0.2 (L) 01/20/2020 1029   BILITOT 0.37 09/30/2016 1027

## 2020-01-21 LAB — CA 125: Cancer Antigen (CA) 125: 28.3 U/mL (ref 0.0–38.1)

## 2020-01-22 ENCOUNTER — Telehealth: Payer: Self-pay | Admitting: Hematology and Oncology

## 2020-01-22 ENCOUNTER — Inpatient Hospital Stay: Admit: 2020-01-22 | Payer: MEDICARE | Attending: Family Medicine | Primary: Family Medicine

## 2020-01-22 DIAGNOSIS — Z780 Asymptomatic menopausal state: Secondary | ICD-10-CM

## 2020-01-22 NOTE — Progress Notes (Signed)
Your bone density test came back normal

## 2020-01-22 NOTE — Telephone Encounter (Signed)
Scheduled per 4/5 sch msg. Called pt and left a msg. Mailing printout

## 2020-01-23 NOTE — Progress Notes (Signed)
Your bone density test came back normal

## 2020-01-28 NOTE — Progress Notes (Signed)
Pharmacist Chemotherapy Monitoring - Follow Up Assessment    I verify that I have reviewed each item in the below checklist:  . Regimen for the patient is scheduled for the appropriate day and plan matches scheduled date. Marland Kitchen Appropriate non-routine labs are ordered dependent on drug ordered. . If applicable, additional medications reviewed and ordered per protocol based on lifetime cumulative doses and/or treatment regimen.   Plan for follow-up and/or issues identified: No . I-vent associated with next due treatment: No . MD and/or nursing notified: No  Jessica Johnston K 01/28/2020 9:21 AM

## 2020-02-03 ENCOUNTER — Inpatient Hospital Stay: Payer: Medicare Other

## 2020-02-03 ENCOUNTER — Other Ambulatory Visit: Payer: Self-pay

## 2020-02-03 VITALS — BP 112/64 | HR 77 | Temp 98.0°F | Resp 18

## 2020-02-03 DIAGNOSIS — Z5111 Encounter for antineoplastic chemotherapy: Secondary | ICD-10-CM | POA: Diagnosis not present

## 2020-02-03 DIAGNOSIS — C5702 Malignant neoplasm of left fallopian tube: Secondary | ICD-10-CM | POA: Diagnosis not present

## 2020-02-03 DIAGNOSIS — I10 Essential (primary) hypertension: Secondary | ICD-10-CM | POA: Diagnosis not present

## 2020-02-03 DIAGNOSIS — Z95828 Presence of other vascular implants and grafts: Secondary | ICD-10-CM

## 2020-02-03 DIAGNOSIS — M109 Gout, unspecified: Secondary | ICD-10-CM | POA: Diagnosis not present

## 2020-02-03 DIAGNOSIS — C5700 Malignant neoplasm of unspecified fallopian tube: Secondary | ICD-10-CM

## 2020-02-03 DIAGNOSIS — Z7189 Other specified counseling: Secondary | ICD-10-CM

## 2020-02-03 DIAGNOSIS — E039 Hypothyroidism, unspecified: Secondary | ICD-10-CM | POA: Diagnosis not present

## 2020-02-03 DIAGNOSIS — E7849 Other hyperlipidemia: Secondary | ICD-10-CM | POA: Diagnosis not present

## 2020-02-03 DIAGNOSIS — E119 Type 2 diabetes mellitus without complications: Secondary | ICD-10-CM | POA: Diagnosis not present

## 2020-02-03 DIAGNOSIS — D649 Anemia, unspecified: Secondary | ICD-10-CM | POA: Diagnosis not present

## 2020-02-03 DIAGNOSIS — Z79899 Other long term (current) drug therapy: Secondary | ICD-10-CM | POA: Diagnosis not present

## 2020-02-03 LAB — COMPREHENSIVE METABOLIC PANEL
ALT: 23 U/L (ref 0–44)
AST: 18 U/L (ref 15–41)
Albumin: 3.3 g/dL — ABNORMAL LOW (ref 3.5–5.0)
Alkaline Phosphatase: 66 U/L (ref 38–126)
Anion gap: 6 (ref 5–15)
BUN: 15 mg/dL (ref 8–23)
CO2: 23 mmol/L (ref 22–32)
Calcium: 8.6 mg/dL — ABNORMAL LOW (ref 8.9–10.3)
Chloride: 110 mmol/L (ref 98–111)
Creatinine, Ser: 0.77 mg/dL (ref 0.44–1.00)
GFR calc Af Amer: >60 mL/min (ref >60)
GFR calc non Af Amer: >60 mL/min (ref >60)
Glucose, Bld: 98 mg/dL (ref 70–99)
Potassium: 4.7 mmol/L (ref 3.5–5.1)
Sodium: 139 mmol/L (ref 135–145)
Total Bilirubin: 0.3 mg/dL (ref 0.3–1.2)
Total Protein: 6.3 g/dL — ABNORMAL LOW (ref 6.5–8.1)

## 2020-02-03 LAB — CBC WITH DIFFERENTIAL/PLATELET
Abs Immature Granulocytes: 0.02 10*3/uL (ref 0.00–0.07)
Basophils Absolute: 0 10*3/uL (ref 0.0–0.1)
Basophils Relative: 1 %
Eosinophils Absolute: 0.2 10*3/uL (ref 0.0–0.5)
Eosinophils Relative: 4 %
HCT: 34.3 % — ABNORMAL LOW (ref 36.0–46.0)
Hemoglobin: 11.1 g/dL — ABNORMAL LOW (ref 12.0–15.0)
Immature Granulocytes: 1 %
Lymphocytes Relative: 22 %
Lymphs Abs: 0.9 10*3/uL (ref 0.7–4.0)
MCH: 31.1 pg (ref 26.0–34.0)
MCHC: 32.4 g/dL (ref 30.0–36.0)
MCV: 96.1 fL (ref 80.0–100.0)
Monocytes Absolute: 0.7 10*3/uL (ref 0.1–1.0)
Monocytes Relative: 16 %
Neutro Abs: 2.4 10*3/uL (ref 1.7–7.7)
Neutrophils Relative %: 56 %
Platelets: 183 10*3/uL (ref 150–400)
RBC: 3.57 MIL/uL — ABNORMAL LOW (ref 3.87–5.11)
RDW: 14.7 % (ref 11.5–15.5)
WBC: 4.1 10*3/uL (ref 4.0–10.5)
nRBC: 0 % (ref 0.0–0.2)

## 2020-02-03 MED ORDER — PROCHLORPERAZINE MALEATE 10 MG PO TABS
ORAL_TABLET | ORAL | Status: AC
  Filled 2020-02-03: qty 1

## 2020-02-03 MED ORDER — SODIUM CHLORIDE 0.9 % IV SOLN
600.0000 mg/m2 | Freq: Once | INTRAVENOUS | Status: AC
  Administered 2020-02-03: 1026 mg via INTRAVENOUS
  Filled 2020-02-03: qty 26.98

## 2020-02-03 MED ORDER — PROCHLORPERAZINE MALEATE 10 MG PO TABS
10.0000 mg | ORAL_TABLET | Freq: Once | ORAL | Status: AC
  Administered 2020-02-03: 10 mg via ORAL

## 2020-02-03 MED ORDER — HEPARIN SOD (PORK) LOCK FLUSH 100 UNIT/ML IV SOLN
500.0000 [IU] | Freq: Once | INTRAVENOUS | Status: AC | PRN
  Administered 2020-02-03: 500 [IU]
  Filled 2020-02-03: qty 5

## 2020-02-03 MED ORDER — SODIUM CHLORIDE 0.9% FLUSH
10.0000 mL | Freq: Once | INTRAVENOUS | Status: AC
  Administered 2020-02-03: 10 mL
  Filled 2020-02-03: qty 10

## 2020-02-03 MED ORDER — SODIUM CHLORIDE 0.9 % IV SOLN
Freq: Once | INTRAVENOUS | Status: AC
  Filled 2020-02-03: qty 250

## 2020-02-03 MED ORDER — SODIUM CHLORIDE 0.9% FLUSH
10.0000 mL | INTRAVENOUS | Status: DC | PRN
  Administered 2020-02-03: 10 mL
  Filled 2020-02-03: qty 10

## 2020-02-03 NOTE — Patient Instructions (Signed)
[facility] Discharge Instructions for Patients Receiving Chemotherapy  Today you received the following chemotherapy agents: gemcitabine.  To help prevent nausea and vomiting after your treatment, we encourage you to take your nausea medication as directed.   If you develop nausea and vomiting that is not controlled by your nausea medication, call the clinic.   BELOW ARE SYMPTOMS THAT SHOULD BE REPORTED IMMEDIATELY:  *FEVER GREATER THAN 100.5 F  *CHILLS WITH OR WITHOUT FEVER  NAUSEA AND VOMITING THAT IS NOT CONTROLLED WITH YOUR NAUSEA MEDICATION  *UNUSUAL SHORTNESS OF BREATH  *UNUSUAL BRUISING OR BLEEDING  TENDERNESS IN MOUTH AND THROAT WITH OR WITHOUT PRESENCE OF ULCERS  *URINARY PROBLEMS  *BOWEL PROBLEMS  UNUSUAL RASH Items with * indicate a potential emergency and should be followed up as soon as possible.  Feel free to call the clinic should you have any questions or concerns. The clinic phone number is [phone].  Please show the CHEMO ALERT CARD at check-in to the Emergency Department and triage nurse.   

## 2020-02-10 ENCOUNTER — Telehealth: Payer: Self-pay | Admitting: Oncology

## 2020-02-10 NOTE — Telephone Encounter (Signed)
Jessica Johnston asked if it is ok for her to go out to dinner (outside or inside) since she is now fully vaccinated for Covid.  She said her second dose of the Pfizer vaccine was more than 2 weeks ago. She was also asking if there is a certain time after chemo when her blood counts would be higher and it would be safer to go out to dinner.

## 2020-02-10 NOTE — Telephone Encounter (Signed)
Called Scottlyn back and advised her of message below from Dr. Alvy Bimler.

## 2020-02-10 NOTE — Telephone Encounter (Signed)
Whether vaccinated or not she is still considered immunocompromised due to chemo. Her blood counts are considered the lowest 7-10 days after chemo Same precaution with hand washing, wearing masks, social distancing rules do not change when going out to restaurant.

## 2020-02-11 NOTE — Progress Notes (Signed)
Pharmacist Chemotherapy Monitoring - Follow Up Assessment    I verify that I have reviewed each item in the below checklist:  . Regimen for the patient is scheduled for the appropriate day and plan matches scheduled date. Marland Kitchen Appropriate non-routine labs are ordered dependent on drug ordered. . If applicable, additional medications reviewed and ordered per protocol based on lifetime cumulative doses and/or treatment regimen.   Plan for follow-up and/or issues identified: No . I-vent associated with next due treatment: No . MD and/or nursing notified: No  Jessica Johnston 02/11/2020 8:38 AM

## 2020-02-14 ENCOUNTER — Other Ambulatory Visit: Payer: Self-pay

## 2020-02-14 ENCOUNTER — Ambulatory Visit (HOSPITAL_COMMUNITY)
Admission: RE | Admit: 2020-02-14 | Discharge: 2020-02-14 | Disposition: A | Discharge status: Home / Self Care | Payer: Medicare Other | Source: Ambulatory Visit | Attending: Hematology and Oncology | Admitting: Hematology and Oncology

## 2020-02-14 ENCOUNTER — Encounter (HOSPITAL_COMMUNITY): Payer: Self-pay

## 2020-02-14 DIAGNOSIS — C5702 Malignant neoplasm of left fallopian tube: Secondary | ICD-10-CM

## 2020-02-14 DIAGNOSIS — C5700 Malignant neoplasm of unspecified fallopian tube: Secondary | ICD-10-CM | POA: Diagnosis not present

## 2020-02-14 DIAGNOSIS — C786 Secondary malignant neoplasm of retroperitoneum and peritoneum: Secondary | ICD-10-CM | POA: Diagnosis not present

## 2020-02-14 MED ORDER — SODIUM CHLORIDE (PF) 0.9 % IJ SOLN
INTRAMUSCULAR | Status: AC
  Filled 2020-02-14: qty 50

## 2020-02-14 MED ORDER — IOHEXOL 300 MG/ML  SOLN
100.0000 mL | Freq: Once | INTRAMUSCULAR | Status: AC | PRN
  Administered 2020-02-14: 08:00:00 100 mL via INTRAVENOUS

## 2020-02-17 ENCOUNTER — Inpatient Hospital Stay: Payer: Medicare Other

## 2020-02-17 ENCOUNTER — Encounter: Payer: Self-pay | Admitting: Hematology and Oncology

## 2020-02-17 ENCOUNTER — Inpatient Hospital Stay (HOSPITAL_BASED_OUTPATIENT_CLINIC_OR_DEPARTMENT_OTHER): Payer: Medicare Other | Admitting: Hematology and Oncology

## 2020-02-17 ENCOUNTER — Inpatient Hospital Stay: Payer: Medicare Other | Attending: Gynecologic Oncology

## 2020-02-17 ENCOUNTER — Other Ambulatory Visit: Payer: Medicare Other

## 2020-02-17 ENCOUNTER — Other Ambulatory Visit: Payer: Self-pay

## 2020-02-17 DIAGNOSIS — C5702 Malignant neoplasm of left fallopian tube: Secondary | ICD-10-CM

## 2020-02-17 DIAGNOSIS — C5700 Malignant neoplasm of unspecified fallopian tube: Secondary | ICD-10-CM

## 2020-02-17 DIAGNOSIS — Z7189 Other specified counseling: Secondary | ICD-10-CM | POA: Diagnosis not present

## 2020-02-17 DIAGNOSIS — Z5111 Encounter for antineoplastic chemotherapy: Secondary | ICD-10-CM | POA: Insufficient documentation

## 2020-02-17 DIAGNOSIS — D6481 Anemia due to antineoplastic chemotherapy: Secondary | ICD-10-CM | POA: Diagnosis not present

## 2020-02-17 DIAGNOSIS — Z452 Encounter for adjustment and management of vascular access device: Secondary | ICD-10-CM | POA: Insufficient documentation

## 2020-02-17 DIAGNOSIS — T451X5A Adverse effect of antineoplastic and immunosuppressive drugs, initial encounter: Secondary | ICD-10-CM | POA: Diagnosis not present

## 2020-02-17 DIAGNOSIS — Z95828 Presence of other vascular implants and grafts: Secondary | ICD-10-CM

## 2020-02-17 LAB — COMPREHENSIVE METABOLIC PANEL
ALT: 45 U/L — ABNORMAL HIGH (ref 0–44)
AST: 28 U/L (ref 15–41)
Albumin: 3.4 g/dL — ABNORMAL LOW (ref 3.5–5.0)
Alkaline Phosphatase: 70 U/L (ref 38–126)
Anion gap: 11 (ref 5–15)
BUN: 17 mg/dL (ref 8–23)
CO2: 23 mmol/L (ref 22–32)
Calcium: 8.6 mg/dL — ABNORMAL LOW (ref 8.9–10.3)
Chloride: 107 mmol/L (ref 98–111)
Creatinine, Ser: 0.79 mg/dL (ref 0.44–1.00)
GFR calc Af Amer: >60 mL/min (ref >60)
GFR calc non Af Amer: >60 mL/min (ref >60)
Glucose, Bld: 93 mg/dL (ref 70–99)
Potassium: 4.8 mmol/L (ref 3.5–5.1)
Sodium: 141 mmol/L (ref 135–145)
Total Bilirubin: 0.3 mg/dL (ref 0.3–1.2)
Total Protein: 6.3 g/dL — ABNORMAL LOW (ref 6.5–8.1)

## 2020-02-17 LAB — CBC WITH DIFFERENTIAL/PLATELET
Abs Immature Granulocytes: 0.02 10*3/uL (ref 0.00–0.07)
Basophils Absolute: 0 10*3/uL (ref 0.0–0.1)
Basophils Relative: 0 %
Eosinophils Absolute: 0.2 10*3/uL (ref 0.0–0.5)
Eosinophils Relative: 4 %
HCT: 33.2 % — ABNORMAL LOW (ref 36.0–46.0)
Hemoglobin: 10.6 g/dL — ABNORMAL LOW (ref 12.0–15.0)
Immature Granulocytes: 0 %
Lymphocytes Relative: 19 %
Lymphs Abs: 0.9 10*3/uL (ref 0.7–4.0)
MCH: 31 pg (ref 26.0–34.0)
MCHC: 31.9 g/dL (ref 30.0–36.0)
MCV: 97.1 fL (ref 80.0–100.0)
Monocytes Absolute: 0.9 10*3/uL (ref 0.1–1.0)
Monocytes Relative: 20 %
Neutro Abs: 2.6 10*3/uL (ref 1.7–7.7)
Neutrophils Relative %: 57 %
Platelets: 233 10*3/uL (ref 150–400)
RBC: 3.42 MIL/uL — ABNORMAL LOW (ref 3.87–5.11)
RDW: 14.9 % (ref 11.5–15.5)
WBC: 4.6 10*3/uL (ref 4.0–10.5)
nRBC: 0 % (ref 0.0–0.2)

## 2020-02-17 MED ORDER — SODIUM CHLORIDE 0.9% FLUSH
10.0000 mL | Freq: Once | INTRAVENOUS | Status: AC
  Administered 2020-02-17: 10 mL
  Filled 2020-02-17: qty 10

## 2020-02-17 MED ORDER — PROCHLORPERAZINE MALEATE 10 MG PO TABS
10.0000 mg | ORAL_TABLET | Freq: Once | ORAL | Status: AC
  Administered 2020-02-17: 10 mg via ORAL

## 2020-02-17 MED ORDER — SODIUM CHLORIDE 0.9 % IV SOLN
600.0000 mg/m2 | Freq: Once | INTRAVENOUS | Status: AC
  Administered 2020-02-17: 1026 mg via INTRAVENOUS
  Filled 2020-02-17: qty 26.98

## 2020-02-17 MED ORDER — PROCHLORPERAZINE MALEATE 10 MG PO TABS
ORAL_TABLET | ORAL | Status: AC
  Filled 2020-02-17: qty 1

## 2020-02-17 MED ORDER — HEPARIN SOD (PORK) LOCK FLUSH 100 UNIT/ML IV SOLN
500.0000 [IU] | Freq: Once | INTRAVENOUS | Status: DC | PRN
  Filled 2020-02-17: qty 5

## 2020-02-17 MED ORDER — SODIUM CHLORIDE 0.9 % IV SOLN
Freq: Once | INTRAVENOUS | Status: AC
  Filled 2020-02-17: qty 250

## 2020-02-17 MED ORDER — SODIUM CHLORIDE 0.9% FLUSH
10.0000 mL | INTRAVENOUS | Status: DC | PRN
  Filled 2020-02-17: qty 10

## 2020-02-17 NOTE — Assessment & Plan Note (Signed)
We discussed goals of care The plan is to continue treatment indefinitely

## 2020-02-17 NOTE — Patient Instructions (Signed)
[facility] Discharge Instructions for Patients Receiving Chemotherapy  Today you received the following chemotherapy agents Gemcitabine.  To help prevent nausea and vomiting after your treatment, we encourage you to take your nausea medication as directed.  If you develop nausea and vomiting that is not controlled by your nausea medication, call the clinic.   BELOW ARE SYMPTOMS THAT SHOULD BE REPORTED IMMEDIATELY:  *FEVER GREATER THAN 100.5 F  *CHILLS WITH OR WITHOUT FEVER  NAUSEA AND VOMITING THAT IS NOT CONTROLLED WITH YOUR NAUSEA MEDICATION  *UNUSUAL SHORTNESS OF BREATH  *UNUSUAL BRUISING OR BLEEDING  TENDERNESS IN MOUTH AND THROAT WITH OR WITHOUT PRESENCE OF ULCERS  *URINARY PROBLEMS  *BOWEL PROBLEMS  UNUSUAL RASH Items with * indicate a potential emergency and should be followed up as soon as possible.  Feel free to call the clinic should you have any questions or concerns. The clinic phone number is [phone].  Please show the CHEMO ALERT CARD at check-in to the Emergency Department and triage nurse.   

## 2020-02-17 NOTE — Assessment & Plan Note (Signed)
Overall, she is doing well with almost no side effects except for anemia I reviewed CT imaging from April 30 with the patient which showed positive response to treatment We will continue treatment every other week indefinitely I plan to lengthen the interval between CT imaging Her next CT scan will be at the end of October of this year 

## 2020-02-17 NOTE — Progress Notes (Signed)
[facility] OFFICE PROGRESS NOTE  Patient Care Team: [name], MD as PCP - General (Internal Medicine)  ASSESSMENT & PLAN:  Fallopian tube cancer, carcinoma ([place]) Overall, she is doing well with almost no side effects except for anemia I reviewed CT imaging from April 30 with the patient which showed positive response to treatment We will continue treatment every other week indefinitely I plan to lengthen the interval between CT imaging Her next CT scan will be at the end of October of this year  Anemia due to antineoplastic chemotherapy This is likely due to recent treatment.  She is not symptomatic We will continue treatment as scheduled  Goals of care, counseling/discussion We discussed goals of care The plan is to continue treatment indefinitely   No orders of the defined types were placed in this encounter.   All questions were answered. The patient knows to call the clinic with any problems, questions or concerns. The total time spent in the appointment was 20 minutes encounter with patients including review of chart and various tests results, discussions about plan of care and coordination of care plan   [name], MD 02/17/2020 8:55 AM  INTERVAL HISTORY: Please see below for problem oriented charting. 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 Extensive omental  nodularity highly worrisome for peritoneal carcinomatosis. Late recurrence of melanoma can present as peritoneal carcinomatosis. Ovarian cancer more commonly presents in this manner. Patient does have a predominately low density right adnexal lesion measuring up to 3.5 cm, although this lesion is not typical for ovarian cancer. 2. No evidence of bowel or ureteral obstruction. 3. No other definite signs of metastatic disease. There are small indeterminate low-density hepatic lesions.   02/08/2016 Tumor Marker   Patient's tumor was tested for the following markers: CA-125 Results of the tumor marker test revealed 656.2   02/15/2016 Procedure   CT-guided core biopsy performed of omental mass just deep to the abdominal wall.   02/15/2016 Pathology Results   Omentum, biopsy, left - PAPILLARY SEROUS NEOPLASM, SEE COMMENT. Microscopic Comment There are papillary collections of largely low grade appearing cells with numerous psammoma bodies. Given the limited material it is difficult to distinguish between invasive implants of a serous borderline tumor and serous carcinoma, especially given the low grade appearance.   02/18/2016 Pathology Results   1. Omentum, resection for tumor INVASIVE IMPLANT OF HIGH GRADE SEROUS CARCINOMA 2. Uterus +/- tubes/ovaries, neoplastic HIGH GRADE SEROUS CARCINOMA INVOLVING LEFT TUBAL FIMBRIA SEROUS CARCINOMA WITH PREDOMINANT PSAMMOMA BODIES IMPLANT AT UTERUS SEROSA, BILATERAL FALLOPIAN TUBAL SEROSA, AND ANTERIOR PERITONEAL REFLECTION CERVIX: HISTOLOGICAL UNREMARKABLE ENDOMETRIUM: INACTIVE ENDOMETRIUM MYOMETRIUM: LEIOMYOMA LEFT OVARY: CYSTADENOFIBROMA RIGHT OVARY AND FALLOPIAN TUBE: HISTOLOGICAL UNREMARKABLE Microscopic Comment 2. ONCOLOGY TABLE - FALLOPIAN TUBE 1. Specimen, including laterality: Omentum, uterus, bilateral ovaries and fallopian tubes 2. Procedure: Hysterectomy, bilateral salpingo-oophorectomy and tumor debulking omentectomy 3. Lymph node sampling  performed: No 4. Tumor  site: uterus serosa, bilateral fallopian tubal serosa, peritoneal and omentum 5. Tumor location in fallopian tube: Left fallopian tubal fimbria 6. Specimen integrity (intact/ruptured/disrupted): Intact 7. Tumor size (cm): multi focal invasive omentum implant greater than 2 cm, left fallopian tube tumor 0.8 cm. 8. Histologic type: Serous carcinoma 9. Grade: 3 10. Microscopic tumor extension: uterus serosa, bilateral fallopian tubal serosa, peritoneal and omentum 11. Margins: NA 12. Lymph-Vascular invasion: identified 13. Lymph nodes: # examined: 0; # positive: NA 14. TNM: pT3c, pNx 15. FIGO Stage (based on pathologic findings, needs clinical correlation: IIIC 16. Comment: High grade serous carcinoma multifocally and extensively involves left fallopian tubal fimbria, omentum, uterus serosa, bilateral fallopian tubal serosa and peritoneal. At the left fallopian tube there are small foci of serous tubal intraepithelial carcinoma identified, so we conclude the carcinoma is fallopian tube origin   03/09/2016 Tumor Marker   Patient's tumor was tested for the following markers: CA-125 Results of the tumor marker test revealed 154.4   03/15/2016 Procedure   Technically successful right IJ power-injectable port catheter placement. Ready for routine use.   03/18/2016 - 07/13/2016 Chemotherapy   The patient had 6 cycles of carboplatin and taxol   04/14/2016 Tumor Marker   Patient's tumor was tested for the following markers: CA-125 Results of the tumor marker test revealed 36.7   04/25/2016 Genetic Testing   Patient has genetic testing done for germline mutation Results revealed patient has no mutation   04/28/2016 Tumor Marker   Patient's tumor was tested for the following markers: CA-125 Results of the tumor marker test revealed 24.6   06/21/2016 Tumor Marker   Patient's tumor was tested for the following markers: CA-125 Results of the tumor marker test revealed 16.6    08/01/2016 Tumor Marker   Patient's tumor was tested for the following markers: CA-125 Results of the tumor marker test revealed 17.2   08/05/2016 Imaging   Interval TAH-BSO. No evidence of residual pelvic mass or metastatic disease within the abdomen or pelvis. No other acute findings.    11/04/2016 Tumor Marker   Patient's tumor was tested for the following markers: CA-125 Results of the tumor marker test revealed 13.6   01/20/2017 Tumor Marker   Patient's tumor was tested for the following markers: CA-125 Results of the tumor marker test revealed 13.8   04/14/2017 Tumor Marker   Patient's tumor was tested for the following markers: CA-125 Results of the tumor marker test revealed 16.1   06/13/2017 Imaging   No acute findings.  No mass or hernia identified.  Colonic diverticulosis, without radiographic evidence of diverticulitis.  Aortic atherosclerosis.   07/03/2017 Tumor Marker   Patient's tumor was tested for the following markers: CA-125 Results of the tumor marker test revealed 18.4   09/19/2017 Tumor Marker   Patient's tumor was tested for the following markers: CA-125 Results of the tumor marker test revealed 18.5   01/23/2018 Tumor Marker   Patient's tumor was tested for the following markers: CA-125 Results of the tumor marker test revealed 35.4   01/30/2018 Imaging   1. No evidence of local fallopian tube carcinoma recurrence within the pelvis. Post hysterectomy. 2. No evidence of metastatic peritoneal disease or omental disease. No solid organ metastasis.    03/20/2018 Tumor Marker   Patient's tumor was tested for the following markers: CA-125 Results of the tumor marker test revealed 73   05/02/2018 Tumor Marker   Patient's tumor was tested for the following markers: CA-125 Results of the tumor marker test revealed 127.1  05/08/2018 Imaging   CT abdomen and pelvis 1. 7 cm left subdiaphragmatic collection of loculated fluid versus low-density soft tissue.  Given the patient's history of rising CA 125, metastatic disease considered highly likely.  2. Areas of fluid or recurrent disease identified in the right lower quadrant adjacent to the cecum and along right lower quadrant small bowel loops.   05/14/2018 Tumor Marker   Patient's tumor was tested for the following markers: CA-125 Results of the tumor marker test revealed 166    Genetic Testing   Patient has genetic testing done for ER/PR and MMR. Results revealed patient has the following on 02/18/2016 surgical pathology: ER 20%, PR 0% MMR: normal    Genetic Testing   Patient has genetic testing done for MSI. Results revealed patient has the following: MSI stable   05/18/2018 - 12/31/2018 Chemotherapy   The patient had carboplatin and Doxil   07/16/2018 Tumor Marker   Patient's tumor was tested for the following markers: CA-125 Results of the tumor marker test revealed 28.7   08/10/2018 Imaging   CT imaging:  Decreased size of peritoneal soft tissue masses in left upper quadrant, consistent with decreased metastatic disease.  No new or progressive metastatic disease. No other acute findings.  Colonic diverticulosis, without radiographic evidence of diverticulitis.  Stable small paraumbilical ventral hernia containing transverse colon.   08/13/2018 Tumor Marker   Patient's tumor was tested for the following markers: CA-125 Results of the tumor marker test revealed 21.6   08/24/2018 Echocardiogram   LV EF: 60% -  65%   10/15/2018 Tumor Marker   Patient's tumor was tested for the following markers: CA-125 Results of the tumor marker test revealed 20.1   10/19/2018 Imaging   Ct scan of abdomen and pelvis Status post hysterectomy, bilateral salpingo-oophorectomy, and omentectomy.  Two peritoneal implants in the left upper abdomen are stable versus mildly decreased, as above. No abdominopelvic ascites.  No evidence of new/progressive metastatic disease.     11/19/2018  Tumor Marker   Patient's tumor was tested for the following markers: CA-125 Results of the tumor marker test revealed 21.7   11/23/2018 Echocardiogram   1. The left ventricle has normal systolic function of 69-48%. The cavity size was normal. There is no increased left ventricular wall thickness. Echo evidence of impaired diastolic relaxation.  2. The right ventricle has normal systolic function. The cavity was normal. There is no increase in right ventricular wall thickness.  3. The mitral valve is normal in structure. There is mild mitral annular calcification present.  4. The tricuspid valve is normal in structure.  5. The aortic valve is tricuspid There is mild thickening of the aortic valve.  6. The pulmonic valve was normal in structure. Pulmonic valve regurgitation is mild by color flow Doppler.  7. Normal LV systolic function; mild diastolic dysfunction.   12/31/2018 Tumor Marker   Patient's tumor was tested for the following markers: CA-125 Results of the tumor marker test revealed 17.1   01/31/2019 Imaging   1. Left upper quadrant peritoneal implants described previously have decreased in the interval. No new or progressive findings in the abdomen/pelvis today. No free fluid. 2. Right paraumbilical ventral hernia contains a small knuckle of transverse colon without complicating features. 3.  Aortic Atherosclerois (ICD10-170.0)  Aortic Atherosclerosis (ICD10-I70.0).   01/31/2019 Tumor Marker   Patient's tumor was tested for the following markers: CA-125 Results of the tumor marker test revealed 18.9   02/11/2019 - 03/13/2019 Chemotherapy   The patient is  taking Niraparib. She self discontinued after 1 month   02/25/2019 Tumor Marker   Patient's tumor was tested for the following markers: CA-125 Results of the tumor marker test revealed 18.6   05/13/2019 Tumor Marker   Patient's tumor was tested for the following markers: CA-125 Results of the tumor marker test revealed 230    05/22/2019 Imaging   1. Increased size of 4.6 cm soft tissue mass in the gastrosplenic ligament, consistent with metastatic disease. 2. No other sites of metastatic disease identified within the abdomen or pelvis. 3. Colonic diverticulosis. No radiographic evidence of diverticulitis. 4. Stable small right paraumbilical hernia containing transverse colon.   Aortic Atherosclerosis (ICD10-I70.0).   05/31/2019 - 08/22/2019 Chemotherapy   The patient had bevacizumab and Taxol for chemotherapy treatment.     05/31/2019 Tumor Marker   Patient's tumor was tested for the following markers: CA-125 Results of the tumor marker test revealed 84   06/21/2019 Tumor Marker   Patient's tumor was tested for the following markers: CA-125 Results of the tumor marker test revealed 49.4.   07/19/2019 Tumor Marker   Patient's tumor was tested for the following markers: CA-125. Results of the tumor marker test revealed 44.5   08/22/2019 Imaging   1. Increase in size of 5.0 cm mass in the gastro splenic ligament consistent with metastatic disease. 2. No additional sites of disease identified within the abdomen or pelvis. 3. Unchanged paraumbilical hernia containing nonobstructed loop of large bowel. 4.  Aortic Atherosclerosis (ICD10-I70.0).     08/30/2019 Tumor Marker   Patient's tumor was tested for the following markers: CA-125 Results of the tumor marker test revealed 71.5   08/30/2019 -  Chemotherapy   The patient had gemzar for chemotherapy treatment.     09/27/2019 Tumor Marker   Patient's tumor was tested for the following markers: CA-125. Results of the tumor marker test revealed 38.5   10/14/2019 Tumor Marker   Patient's tumor was tested for the following markers: CA-125 Results of the tumor marker test revealed 40.8   10/21/2019 Tumor Marker   Patient's tumor was tested for the following markers: CA-125 Results of the tumor marker test revealed 34.8.   11/22/2019 Imaging   1. Interval  decrease in size of left upper quadrant cystic and solid peritoneal lesion. 2. No new sites of disease. 3. Unchanged periumbilical hernia containing a nonobstructed loop of small bowel. 4.  Aortic Atherosclerosis (ICD10-I70.0).     11/25/2019 Tumor Marker   Patient's tumor was tested for the following markers: CA-125 Results of the tumor marker test revealed 22.7   12/23/2019 Tumor Marker   Patient's tumor was tested for the following markers: CA-125 Results of the tumor marker test revealed 26.3   01/20/2020 Tumor Marker   Patient's tumor was tested for the following markers: CA-125 Results of the tumor marker test revealed 28.3   02/14/2020 Imaging   1. 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 2. No new or progressive disease identified within the abdomen or pelvis. 3. Colonic diverticulosis. No radiographic evidence of diverticulitis. 4. Small paraumbilical ventral hernia.       REVIEW OF SYSTEMS:   Constitutional: Denies fevers, chills or abnormal weight loss Eyes: Denies blurriness of vision Ears, nose, mouth, throat, and face: Denies mucositis or sore throat Respiratory: Denies cough, dyspnea or wheezes Cardiovascular: Denies palpitation, chest discomfort or lower extremity swelling Gastrointestinal:  Denies nausea, heartburn or change in bowel habits Skin: Denies abnormal skin rashes Lymphatics:  Denies new lymphadenopathy or easy bruising Neurological:Denies numbness, tingling or new weaknesses Behavioral/Psych: Mood is stable, no new changes  All other systems were reviewed with the patient and are negative.  I have reviewed the past medical history, past surgical history, social history and family history with the patient and they are unchanged from previous note.  ALLERGIES:  has No Known Allergies.  MEDICATIONS:  Current Outpatient Medications  Medication Sig Dispense Refill  . Cholecalciferol (VITAMIN D) 2000 units CAPS  Take 1 capsule by mouth daily.    Marland Kitchen ezetimibe (ZETIA) 10 MG tablet Take 10 mg by mouth daily.    Marland Kitchen lidocaine-prilocaine (EMLA) cream Apply to affected area once 30 g 3  . morphine (MSIR) 15 MG tablet Take 1 tablet (15 mg total) by mouth every 4 (four) hours as needed for severe pain. 30 tablet 0  . ondansetron (ZOFRAN) 8 MG tablet Take 1 tablet (8 mg total) by mouth 2 (two) times daily as needed (Nausea or vomiting). 30 tablet 1  . prochlorperazine (COMPAZINE) 10 MG tablet Take 1 tablet (10 mg total) by mouth every 6 (six) hours as needed (Nausea or vomiting). 30 tablet 1   No current facility-administered medications for this visit.    PHYSICAL EXAMINATION: ECOG PERFORMANCE STATUS: 0 - Asymptomatic  Vitals:   02/17/20 0845  BP: 120/61  Pulse: 67  Resp: 18  Temp: 98.2 F (36.8 C)  SpO2: 100%   Filed Weights   02/17/20 0845  Weight: 148 lb 6.4 oz (67.3 kg)    GENERAL:alert, no distress and comfortable NEURO: alert & oriented x 3 with fluent speech, no focal motor/sensory deficits  LABORATORY DATA:  I have reviewed the data as listed    Component Value Date/Time   NA 139 02/03/2020 0918   NA 139 06/07/2017 1430   K 4.7 02/03/2020 0918   K 3.8 06/07/2017 1430   CL 110 02/03/2020 0918   CO2 23 02/03/2020 0918   CO2 27 06/07/2017 1430   GLUCOSE 98 02/03/2020 0918   GLUCOSE 124 06/07/2017 1430   BUN 15 02/03/2020 0918   BUN 14.0 06/07/2017 1430   CREATININE 0.77 02/03/2020 0918   CREATININE 0.78 01/20/2020 1029   CREATININE 0.8 06/07/2017 1430   CALCIUM 8.6 (L) 02/03/2020 0918   CALCIUM 9.3 06/07/2017 1430   PROT 6.3 (L) 02/03/2020 0918   PROT 6.6 09/30/2016 1027   ALBUMIN 3.3 (L) 02/03/2020 0918   ALBUMIN 3.5 09/30/2016 1027   AST 18 02/03/2020 0918   AST 20 01/20/2020 1029   AST 18 09/30/2016 1027   ALT 23 02/03/2020 0918   ALT 28 01/20/2020 1029   ALT 21 09/30/2016 1027   ALKPHOS 66 02/03/2020 0918   ALKPHOS 77 09/30/2016 1027   BILITOT 0.3 02/03/2020 0918    BILITOT 0.2 (L) 01/20/2020 1029   BILITOT 0.37 09/30/2016 1027   GFRNONAA >60 02/03/2020 0918   GFRNONAA >60 01/20/2020 1029   GFRAA >60 02/03/2020 0918   GFRAA >60 01/20/2020 1029    No results found for: SPEP, UPEP  Lab Results  Component Value Date   WBC 4.6 02/17/2020   NEUTROABS 2.6 02/17/2020   HGB 10.6 (L) 02/17/2020   HCT 33.2 (L) 02/17/2020   MCV 97.1 02/17/2020   PLT 233 02/17/2020      Chemistry      Component Value Date/Time   NA 139 02/03/2020 0918   NA 139 06/07/2017 1430   K 4.7 02/03/2020 0918   K 3.8 06/07/2017 1430  CL 110 02/03/2020 0918   CO2 23 02/03/2020 0918   CO2 27 06/07/2017 1430   BUN 15 02/03/2020 0918   BUN 14.0 06/07/2017 1430   CREATININE 0.77 02/03/2020 0918   CREATININE 0.78 01/20/2020 1029   CREATININE 0.8 06/07/2017 1430      Component Value Date/Time   CALCIUM 8.6 (L) 02/03/2020 0918   CALCIUM 9.3 06/07/2017 1430   ALKPHOS 66 02/03/2020 0918   ALKPHOS 77 09/30/2016 1027   AST 18 02/03/2020 0918   AST 20 01/20/2020 1029   AST 18 09/30/2016 1027   ALT 23 02/03/2020 0918   ALT 28 01/20/2020 1029   ALT 21 09/30/2016 1027   BILITOT 0.3 02/03/2020 0918   BILITOT 0.2 (L) 01/20/2020 1029   BILITOT 0.37 09/30/2016 1027       RADIOGRAPHIC STUDIES: I have reviewed multiple CT imaging with the patient I have personally reviewed the radiological images as listed and agreed with the findings in the report. CT ABDOMEN PELVIS W CONTRAST  Result Date: 02/14/2020 CLINICAL DATA:  Follow-up metastatic fallopian tube carcinoma. Ongoing chemotherapy. EXAM: CT ABDOMEN AND PELVIS WITH CONTRAST TECHNIQUE: Multidetector CT imaging of the abdomen and pelvis was performed using the standard protocol following bolus administration of intravenous contrast. CONTRAST:  158m OMNIPAQUE IOHEXOL 300 MG/ML  SOLN COMPARISON:  11/22/2019 FINDINGS: Lower Chest: No acute findings. Hepatobiliary: No hepatic masses identified. Gallbladder is unremarkable. No  evidence of biliary ductal dilatation. Pancreas:  No mass or inflammatory changes. Spleen: Within normal limits in size and appearance. Adrenals/Urinary Tract: No masses identified. No evidence of ureteral calculi or hydronephrosis. Stomach/Bowel: No evidence of obstruction, inflammatory process or abnormal fluid collections. Diverticulosis is seen mainly involving the descending and sigmoid colon, however there is no evidence of diverticulitis. Vascular/Lymphatic: No pathologically enlarged lymph nodes. No abdominal aortic aneurysm. Aortic atherosclerosis incidentally noted. Reproductive: Prior hysterectomy again noted. No evidence of recurrent pelvic mass or ascites. Other: A cystic and solid peritoneal mass is seen in the left subphrenic space. This measures 3.8 x 3.5 cm on image 10/2, minimally decreased in size from 4.0 x 3.7 cm when measured at same level on prior exam. No other peritoneal masses or thickening identified. Small paraumbilical ventral hernia containing transverse colon shows mild decrease in size since previous study. Musculoskeletal:  No suspicious bone lesions identified. IMPRESSION: 1. 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 2. No new or progressive disease identified within the abdomen or pelvis. 3. Colonic diverticulosis. No radiographic evidence of diverticulitis. 4. Small paraumbilical ventral hernia. Electronically Signed   By: JMarlaine HindM.D.   On: 02/14/2020 09:25

## 2020-02-17 NOTE — Assessment & Plan Note (Signed)
This is likely due to recent treatment.  She is not symptomatic We will continue treatment as scheduled 

## 2020-02-18 ENCOUNTER — Telehealth: Payer: Self-pay | Admitting: Oncology

## 2020-02-18 ENCOUNTER — Telehealth: Payer: Self-pay | Admitting: Hematology and Oncology

## 2020-02-18 LAB — CA 125: Cancer Antigen (CA) 125: 40.2 U/mL — ABNORMAL HIGH (ref 0.0–38.1)

## 2020-02-18 NOTE — Telephone Encounter (Signed)
Ct is more accurate to measure disease and her CT last week is good CA-125 is non specific, so at this point don't worry about it

## 2020-02-18 NOTE — Telephone Encounter (Signed)
Called Roslynn back and advised her of message below from Dr. Alvy Bimler.  She verbalized understanding.

## 2020-02-18 NOTE — Telephone Encounter (Signed)
Scheduled appts per 5/3 sch msg. Pt confirmed appt date and time.  

## 2020-02-18 NOTE — Telephone Encounter (Signed)
Jessica Johnston called and said she noticed that her CA 125 results are higher.  She is wondering if this is ok.

## 2020-02-25 NOTE — Progress Notes (Signed)
Pharmacist Chemotherapy Monitoring - Follow Up Assessment    I verify that I have reviewed each item in the below checklist:  . Regimen for the patient is scheduled for the appropriate day and plan matches scheduled date. Marland Kitchen Appropriate non-routine labs are ordered dependent on drug ordered. . If applicable, additional medications reviewed and ordered per protocol based on lifetime cumulative doses and/or treatment regimen.   Plan for follow-up and/or issues identified: No . I-vent associated with next due treatment: No . MD and/or nursing notified: No  Jessica Johnston D 02/25/2020 10:08 AM

## 2020-03-02 ENCOUNTER — Inpatient Hospital Stay: Payer: Medicare Other

## 2020-03-02 ENCOUNTER — Other Ambulatory Visit: Payer: Self-pay

## 2020-03-02 ENCOUNTER — Telehealth: Payer: Self-pay

## 2020-03-02 VITALS — BP 130/59 | HR 66 | Temp 98.3°F | Resp 18

## 2020-03-02 DIAGNOSIS — C5702 Malignant neoplasm of left fallopian tube: Secondary | ICD-10-CM

## 2020-03-02 DIAGNOSIS — Z7189 Other specified counseling: Secondary | ICD-10-CM

## 2020-03-02 DIAGNOSIS — Z95828 Presence of other vascular implants and grafts: Secondary | ICD-10-CM

## 2020-03-02 DIAGNOSIS — Z452 Encounter for adjustment and management of vascular access device: Secondary | ICD-10-CM | POA: Diagnosis not present

## 2020-03-02 DIAGNOSIS — Z5111 Encounter for antineoplastic chemotherapy: Secondary | ICD-10-CM | POA: Diagnosis not present

## 2020-03-02 DIAGNOSIS — C5700 Malignant neoplasm of unspecified fallopian tube: Secondary | ICD-10-CM

## 2020-03-02 LAB — CBC WITH DIFFERENTIAL/PLATELET
Abs Immature Granulocytes: 0.02 10*3/uL (ref 0.00–0.07)
Basophils Absolute: 0 10*3/uL (ref 0.0–0.1)
Basophils Relative: 1 %
Eosinophils Absolute: 0.1 10*3/uL (ref 0.0–0.5)
Eosinophils Relative: 3 %
HCT: 33.4 % — ABNORMAL LOW (ref 36.0–46.0)
Hemoglobin: 10.9 g/dL — ABNORMAL LOW (ref 12.0–15.0)
Immature Granulocytes: 1 %
Lymphocytes Relative: 17 %
Lymphs Abs: 0.7 10*3/uL (ref 0.7–4.0)
MCH: 30.5 pg (ref 26.0–34.0)
MCHC: 32.6 g/dL (ref 30.0–36.0)
MCV: 93.6 fL (ref 80.0–100.0)
Monocytes Absolute: 0.6 10*3/uL (ref 0.1–1.0)
Monocytes Relative: 15 %
Neutro Abs: 2.9 10*3/uL (ref 1.7–7.7)
Neutrophils Relative %: 63 %
Platelets: 176 10*3/uL (ref 150–400)
RBC: 3.57 MIL/uL — ABNORMAL LOW (ref 3.87–5.11)
RDW: 14.9 % (ref 11.5–15.5)
WBC: 4.4 10*3/uL (ref 4.0–10.5)
nRBC: 0 % (ref 0.0–0.2)

## 2020-03-02 LAB — COMPREHENSIVE METABOLIC PANEL
ALT: 20 U/L (ref 0–44)
AST: 18 U/L (ref 15–41)
Albumin: 3.5 g/dL (ref 3.5–5.0)
Alkaline Phosphatase: 74 U/L (ref 38–126)
Anion gap: 11 (ref 5–15)
BUN: 16 mg/dL (ref 8–23)
CO2: 21 mmol/L — ABNORMAL LOW (ref 22–32)
Calcium: 8.6 mg/dL — ABNORMAL LOW (ref 8.9–10.3)
Chloride: 105 mmol/L (ref 98–111)
Creatinine, Ser: 0.77 mg/dL (ref 0.44–1.00)
GFR calc Af Amer: >60 mL/min (ref >60)
GFR calc non Af Amer: >60 mL/min (ref >60)
Glucose, Bld: 112 mg/dL — ABNORMAL HIGH (ref 70–99)
Potassium: 4.4 mmol/L (ref 3.5–5.1)
Sodium: 137 mmol/L (ref 135–145)
Total Bilirubin: 0.3 mg/dL (ref 0.3–1.2)
Total Protein: 6.5 g/dL (ref 6.5–8.1)

## 2020-03-02 MED ORDER — PROCHLORPERAZINE MALEATE 10 MG PO TABS
10.0000 mg | ORAL_TABLET | Freq: Once | ORAL | Status: AC
  Administered 2020-03-02: 10 mg via ORAL

## 2020-03-02 MED ORDER — ALTEPLASE 2 MG IJ SOLR
INTRAMUSCULAR | Status: AC
  Filled 2020-03-02: qty 2

## 2020-03-02 MED ORDER — SODIUM CHLORIDE 0.9 % IV SOLN
Freq: Once | INTRAVENOUS | Status: AC
  Filled 2020-03-02: qty 250

## 2020-03-02 MED ORDER — SODIUM CHLORIDE 0.9% FLUSH
10.0000 mL | Freq: Once | INTRAVENOUS | Status: AC
  Administered 2020-03-02: 10 mL
  Filled 2020-03-02: qty 10

## 2020-03-02 MED ORDER — PROCHLORPERAZINE MALEATE 10 MG PO TABS
ORAL_TABLET | ORAL | Status: AC
  Filled 2020-03-02: qty 1

## 2020-03-02 MED ORDER — SODIUM CHLORIDE 0.9 % IV SOLN
600.0000 mg/m2 | Freq: Once | INTRAVENOUS | Status: AC
  Administered 2020-03-02: 1026 mg via INTRAVENOUS
  Filled 2020-03-02: qty 26.98

## 2020-03-02 MED ORDER — ALTEPLASE 2 MG IJ SOLR
2.0000 mg | Freq: Once | INTRAMUSCULAR | Status: AC
  Administered 2020-03-02: 2 mg
  Filled 2020-03-02: qty 2

## 2020-03-02 MED ORDER — HEPARIN SOD (PORK) LOCK FLUSH 100 UNIT/ML IV SOLN
500.0000 [IU] | Freq: Once | INTRAVENOUS | Status: AC | PRN
  Administered 2020-03-02: 500 [IU]
  Filled 2020-03-02: qty 5

## 2020-03-02 MED ORDER — SODIUM CHLORIDE 0.9% FLUSH
10.0000 mL | INTRAVENOUS | Status: DC | PRN
  Administered 2020-03-02: 10 mL
  Filled 2020-03-02: qty 10

## 2020-03-02 NOTE — Patient Instructions (Signed)
[facility] Discharge Instructions for Patients Receiving Chemotherapy  Today you received the following chemotherapy agents Gemcitabine.  To help prevent nausea and vomiting after your treatment, we encourage you to take your nausea medication as directed.  If you develop nausea and vomiting that is not controlled by your nausea medication, call the clinic.   BELOW ARE SYMPTOMS THAT SHOULD BE REPORTED IMMEDIATELY:  *FEVER GREATER THAN 100.5 F  *CHILLS WITH OR WITHOUT FEVER  NAUSEA AND VOMITING THAT IS NOT CONTROLLED WITH YOUR NAUSEA MEDICATION  *UNUSUAL SHORTNESS OF BREATH  *UNUSUAL BRUISING OR BLEEDING  TENDERNESS IN MOUTH AND THROAT WITH OR WITHOUT PRESENCE OF ULCERS  *URINARY PROBLEMS  *BOWEL PROBLEMS  UNUSUAL RASH Items with * indicate a potential emergency and should be followed up as soon as possible.  Feel free to call the clinic should you have any questions or concerns. The clinic phone number is [phone].  Please show the CHEMO ALERT CARD at check-in to the Emergency Department and triage nurse.   

## 2020-03-02 NOTE — Telephone Encounter (Signed)
Port accessed did not give blood return peripheral labs drawn.KKLPN

## 2020-03-02 NOTE — Progress Notes (Signed)
Dr. Alvy Bimler informed pt's port has not had blood return today, pt received cathflo over 2 hours ago with no results.  MD requests that we repeat cathflo today.

## 2020-03-09 ENCOUNTER — Ambulatory Visit: Payer: MEDICARE | Primary: Family Medicine

## 2020-03-10 NOTE — Progress Notes (Signed)
Pharmacist Chemotherapy Monitoring - Follow Up Assessment    I verify that I have reviewed each item in the below checklist:  . Regimen for the patient is scheduled for the appropriate day and plan matches scheduled date. Marland Kitchen Appropriate non-routine labs are ordered dependent on drug ordered. . If applicable, additional medications reviewed and ordered per protocol based on lifetime cumulative doses and/or treatment regimen.   Plan for follow-up and/or issues identified: No . I-vent associated with next due treatment: No . MD and/or nursing notified: No  Maniya Donovan K 03/10/2020 2:41 PM

## 2020-03-17 ENCOUNTER — Encounter: Payer: Self-pay | Admitting: Hematology and Oncology

## 2020-03-17 ENCOUNTER — Inpatient Hospital Stay: Payer: Medicare Other

## 2020-03-17 ENCOUNTER — Inpatient Hospital Stay: Payer: Medicare Other | Attending: Gynecologic Oncology

## 2020-03-17 ENCOUNTER — Other Ambulatory Visit: Payer: Self-pay

## 2020-03-17 ENCOUNTER — Other Ambulatory Visit: Payer: Self-pay | Admitting: Hematology and Oncology

## 2020-03-17 ENCOUNTER — Inpatient Hospital Stay (HOSPITAL_BASED_OUTPATIENT_CLINIC_OR_DEPARTMENT_OTHER): Payer: Medicare Other | Admitting: Hematology and Oncology

## 2020-03-17 ENCOUNTER — Encounter

## 2020-03-17 DIAGNOSIS — C5700 Malignant neoplasm of unspecified fallopian tube: Secondary | ICD-10-CM | POA: Diagnosis not present

## 2020-03-17 DIAGNOSIS — Z95828 Presence of other vascular implants and grafts: Secondary | ICD-10-CM

## 2020-03-17 DIAGNOSIS — T451X5A Adverse effect of antineoplastic and immunosuppressive drugs, initial encounter: Secondary | ICD-10-CM | POA: Diagnosis not present

## 2020-03-17 DIAGNOSIS — D6481 Anemia due to antineoplastic chemotherapy: Secondary | ICD-10-CM

## 2020-03-17 DIAGNOSIS — C5702 Malignant neoplasm of left fallopian tube: Secondary | ICD-10-CM

## 2020-03-17 DIAGNOSIS — Z5111 Encounter for antineoplastic chemotherapy: Secondary | ICD-10-CM | POA: Insufficient documentation

## 2020-03-17 DIAGNOSIS — Z7189 Other specified counseling: Secondary | ICD-10-CM

## 2020-03-17 LAB — COMPREHENSIVE METABOLIC PANEL
ALT: 20 U/L (ref 0–44)
AST: 19 U/L (ref 15–41)
Albumin: 3.4 g/dL — ABNORMAL LOW (ref 3.5–5.0)
Alkaline Phosphatase: 75 U/L (ref 38–126)
Anion gap: 11 (ref 5–15)
BUN: 15 mg/dL (ref 8–23)
CO2: 23 mmol/L (ref 22–32)
Calcium: 9 mg/dL (ref 8.9–10.3)
Chloride: 107 mmol/L (ref 98–111)
Creatinine, Ser: 0.83 mg/dL (ref 0.44–1.00)
GFR calc Af Amer: >60 mL/min (ref >60)
GFR calc non Af Amer: >60 mL/min (ref >60)
Glucose, Bld: 103 mg/dL — ABNORMAL HIGH (ref 70–99)
Potassium: 4.2 mmol/L (ref 3.5–5.1)
Sodium: 141 mmol/L (ref 135–145)
Total Bilirubin: 0.2 mg/dL — ABNORMAL LOW (ref 0.3–1.2)
Total Protein: 6.4 g/dL — ABNORMAL LOW (ref 6.5–8.1)

## 2020-03-17 LAB — CBC WITH DIFFERENTIAL/PLATELET
Abs Immature Granulocytes: 0.03 10*3/uL (ref 0.00–0.07)
Basophils Absolute: 0 10*3/uL (ref 0.0–0.1)
Basophils Relative: 1 %
Eosinophils Absolute: 0.2 10*3/uL (ref 0.0–0.5)
Eosinophils Relative: 4 %
HCT: 34.5 % — ABNORMAL LOW (ref 36.0–46.0)
Hemoglobin: 11.1 g/dL — ABNORMAL LOW (ref 12.0–15.0)
Immature Granulocytes: 1 %
Lymphocytes Relative: 19 %
Lymphs Abs: 0.8 10*3/uL (ref 0.7–4.0)
MCH: 30.2 pg (ref 26.0–34.0)
MCHC: 32.2 g/dL (ref 30.0–36.0)
MCV: 94 fL (ref 80.0–100.0)
Monocytes Absolute: 0.6 10*3/uL (ref 0.1–1.0)
Monocytes Relative: 13 %
Neutro Abs: 2.8 10*3/uL (ref 1.7–7.7)
Neutrophils Relative %: 62 %
Platelets: 239 10*3/uL (ref 150–400)
RBC: 3.67 MIL/uL — ABNORMAL LOW (ref 3.87–5.11)
RDW: 14.6 % (ref 11.5–15.5)
WBC: 4.4 10*3/uL (ref 4.0–10.5)
nRBC: 0 % (ref 0.0–0.2)

## 2020-03-17 MED ORDER — ATORVASTATIN 40 MG TAB
40 mg | ORAL_TABLET | ORAL | 3 refills | Status: DC
Start: 2020-03-17 — End: 2021-03-12

## 2020-03-17 MED ORDER — BENICAR HCT 20 MG-12.5 MG TABLET
ORAL_TABLET | ORAL | 3 refills | Status: DC
Start: 2020-03-17 — End: 2021-02-26

## 2020-03-17 MED ORDER — PROCHLORPERAZINE MALEATE 10 MG PO TABS
10.0000 mg | ORAL_TABLET | Freq: Once | ORAL | Status: AC
  Administered 2020-03-17: 10 mg via ORAL

## 2020-03-17 MED ORDER — SODIUM CHLORIDE 0.9% FLUSH
10.0000 mL | INTRAVENOUS | Status: DC | PRN
  Administered 2020-03-17: 10 mL
  Filled 2020-03-17: qty 10

## 2020-03-17 MED ORDER — SODIUM CHLORIDE 0.9 % IV SOLN
Freq: Once | INTRAVENOUS | Status: AC
  Filled 2020-03-17: qty 250

## 2020-03-17 MED ORDER — PROCHLORPERAZINE MALEATE 10 MG PO TABS
ORAL_TABLET | ORAL | Status: AC
  Filled 2020-03-17: qty 1

## 2020-03-17 MED ORDER — SODIUM CHLORIDE 0.9 % IV SOLN
600.0000 mg/m2 | Freq: Once | INTRAVENOUS | Status: AC
  Administered 2020-03-17: 1026 mg via INTRAVENOUS
  Filled 2020-03-17: qty 26.98

## 2020-03-17 MED ORDER — HEPARIN SOD (PORK) LOCK FLUSH 100 UNIT/ML IV SOLN
500.0000 [IU] | Freq: Once | INTRAVENOUS | Status: AC | PRN
  Administered 2020-03-17: 500 [IU]
  Filled 2020-03-17: qty 5

## 2020-03-17 MED ORDER — SODIUM CHLORIDE 0.9% FLUSH
10.0000 mL | Freq: Once | INTRAVENOUS | Status: AC
  Administered 2020-03-17: 10 mL
  Filled 2020-03-17: qty 10

## 2020-03-17 NOTE — Assessment & Plan Note (Signed)
This is likely due to recent treatment.  She is not symptomatic We will continue treatment as scheduled 

## 2020-03-17 NOTE — Progress Notes (Signed)
[facility] OFFICE PROGRESS NOTE  Patient Care Team: Perini, Mark, MD as PCP - General (Internal Medicine)  ASSESSMENT & PLAN:  Fallopian tube cancer, carcinoma (HCC) Overall, she is doing well with almost no side effects except for anemia I reviewed CT imaging from April 30 with the patient which showed positive response to treatment We will continue treatment every other week indefinitely I plan to lengthen the interval between CT imaging Her next CT scan will be at the end of October of this year  Anemia due to antineoplastic chemotherapy This is likely due to recent treatment.  She is not symptomatic We will continue treatment as scheduled   No orders of the defined types were placed in this encounter.   All questions were answered. The patient knows to call the clinic with any problems, questions or concerns. The total time spent in the appointment was 20 minutes encounter with patients including review of chart and various tests results, discussions about plan of care and coordination of care plan   Ni Gorsuch, MD 03/17/2020 9:39 AM  INTERVAL HISTORY: Please see below for problem oriented charting. 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 Extensive omental nodularity highly worrisome for peritoneal carcinomatosis. Late recurrence of melanoma  can present as peritoneal carcinomatosis. Ovarian cancer more commonly presents in this manner. Patient does have a predominately low density right adnexal lesion measuring up to 3.5 cm, although this lesion is not typical for ovarian cancer. 2. No evidence of bowel or ureteral obstruction. 3. No other definite signs of metastatic disease. There are small indeterminate low-density hepatic lesions.   02/08/2016 Tumor Marker   Patient's tumor was tested for the following markers: CA-125 Results of the tumor marker test revealed 656.2   02/15/2016 Procedure   CT-guided core biopsy performed of omental mass just deep to the abdominal wall.   02/15/2016 Pathology Results   Omentum, biopsy, left - PAPILLARY SEROUS NEOPLASM, SEE COMMENT. Microscopic Comment There are papillary collections of largely low grade appearing cells with numerous psammoma bodies. Given the limited material it is difficult to distinguish between invasive implants of a serous borderline tumor and serous carcinoma, especially given the low grade appearance.   02/18/2016 Pathology Results   1. Omentum, resection for tumor INVASIVE IMPLANT OF HIGH GRADE SEROUS CARCINOMA 2. Uterus +/- tubes/ovaries, neoplastic HIGH GRADE SEROUS CARCINOMA INVOLVING LEFT TUBAL FIMBRIA SEROUS CARCINOMA WITH PREDOMINANT PSAMMOMA BODIES IMPLANT AT UTERUS SEROSA, BILATERAL FALLOPIAN TUBAL SEROSA, AND ANTERIOR PERITONEAL REFLECTION CERVIX: HISTOLOGICAL UNREMARKABLE ENDOMETRIUM: INACTIVE ENDOMETRIUM MYOMETRIUM: LEIOMYOMA LEFT OVARY: CYSTADENOFIBROMA RIGHT OVARY AND FALLOPIAN TUBE: HISTOLOGICAL UNREMARKABLE Microscopic Comment 2. ONCOLOGY TABLE - FALLOPIAN TUBE 1. Specimen, including laterality: Omentum, uterus, bilateral ovaries and fallopian tubes 2. Procedure: Hysterectomy, bilateral salpingo-oophorectomy and tumor debulking omentectomy 3. Lymph node sampling performed: No 4. Tumor site: uterus serosa, bilateral fallopian tubal serosa, peritoneal  and omentum 5. Tumor   location in fallopian tube: Left fallopian tubal fimbria 6. Specimen integrity (intact/ruptured/disrupted): Intact 7. Tumor size (cm): multi focal invasive omentum implant greater than 2 cm, left fallopian tube tumor 0.8 cm. 8. Histologic type: Serous carcinoma 9. Grade: 3 10. Microscopic tumor extension: uterus serosa, bilateral fallopian tubal serosa, peritoneal and omentum 11. Margins: NA 12. Lymph-Vascular invasion: identified 13. Lymph nodes: # examined: 0; # positive: NA 14. TNM: pT3c, pNx 15. FIGO Stage (based on pathologic findings, needs clinical correlation: IIIC 16. Comment: High grade serous carcinoma multifocally and extensively involves left fallopian tubal fimbria, omentum, uterus serosa, bilateral fallopian tubal serosa and peritoneal. At the left fallopian tube there are small foci of serous tubal intraepithelial carcinoma identified, so we conclude the carcinoma is fallopian tube origin   03/09/2016 Tumor Marker   Patient's tumor was tested for the following markers: CA-125 Results of the tumor marker test revealed 154.4   03/15/2016 Procedure   Technically successful right IJ power-injectable port catheter placement. Ready for routine use.   03/18/2016 - 07/13/2016 Chemotherapy   The patient had 6 cycles of carboplatin and taxol   04/14/2016 Tumor Marker   Patient's tumor was tested for the following markers: CA-125 Results of the tumor marker test revealed 36.7   04/25/2016 Genetic Testing   Patient has genetic testing done for germline mutation Results revealed patient has no mutation   04/28/2016 Tumor Marker   Patient's tumor was tested for the following markers: CA-125 Results of the tumor marker test revealed 24.6   06/21/2016 Tumor Marker   Patient's tumor was tested for the following markers: CA-125 Results of the tumor marker test revealed 16.6   08/01/2016 Tumor Marker   Patient's tumor was tested for the following markers:  CA-125 Results of the tumor marker test revealed 17.2   08/05/2016 Imaging   Interval TAH-BSO. No evidence of residual pelvic mass or metastatic disease within the abdomen or pelvis. No other acute findings.    11/04/2016 Tumor Marker   Patient's tumor was tested for the following markers: CA-125 Results of the tumor marker test revealed 13.6   01/20/2017 Tumor Marker   Patient's tumor was tested for the following markers: CA-125 Results of the tumor marker test revealed 13.8   04/14/2017 Tumor Marker   Patient's tumor was tested for the following markers: CA-125 Results of the tumor marker test revealed 16.1   06/13/2017 Imaging   No acute findings.  No mass or hernia identified.  Colonic diverticulosis, without radiographic evidence of diverticulitis.  Aortic atherosclerosis.   07/03/2017 Tumor Marker   Patient's tumor was tested for the following markers: CA-125 Results of the tumor marker test revealed 18.4   09/19/2017 Tumor Marker   Patient's tumor was tested for the following markers: CA-125 Results of the tumor marker test revealed 18.5   01/23/2018 Tumor Marker   Patient's tumor was tested for the following markers: CA-125 Results of the tumor marker test revealed 35.4   01/30/2018 Imaging   1. No evidence of local fallopian tube carcinoma recurrence within the pelvis. Post hysterectomy. 2. No evidence of metastatic peritoneal disease or omental disease. No solid organ metastasis.    03/20/2018 Tumor Marker   Patient's tumor was tested for the following markers: CA-125 Results of the tumor marker test revealed 73   05/02/2018 Tumor Marker   Patient's tumor was tested for the following markers: CA-125 Results of the tumor marker test revealed 127.1   05/08/2018 Imaging   CT abdomen and pelvis 1. 7   cm left subdiaphragmatic collection of loculated fluid versus low-density soft tissue. Given the patient's history of rising CA 125, metastatic disease considered highly  likely.  2. Areas of fluid or recurrent disease identified in the right lower quadrant adjacent to the cecum and along right lower quadrant small bowel loops.   05/14/2018 Tumor Marker   Patient's tumor was tested for the following markers: CA-125 Results of the tumor marker test revealed 166    Genetic Testing   Patient has genetic testing done for ER/PR and MMR. Results revealed patient has the following on 02/18/2016 surgical pathology: ER 20%, PR 0% MMR: normal    Genetic Testing   Patient has genetic testing done for MSI. Results revealed patient has the following: MSI stable   05/18/2018 - 12/31/2018 Chemotherapy   The patient had carboplatin and Doxil   07/16/2018 Tumor Marker   Patient's tumor was tested for the following markers: CA-125 Results of the tumor marker test revealed 28.7   08/10/2018 Imaging   CT imaging:  Decreased size of peritoneal soft tissue masses in left upper quadrant, consistent with decreased metastatic disease.  No new or progressive metastatic disease. No other acute findings.  Colonic diverticulosis, without radiographic evidence of diverticulitis.  Stable small paraumbilical ventral hernia containing transverse colon.   08/13/2018 Tumor Marker   Patient's tumor was tested for the following markers: CA-125 Results of the tumor marker test revealed 21.6   08/24/2018 Echocardiogram   LV EF: 60% -  65%   10/15/2018 Tumor Marker   Patient's tumor was tested for the following markers: CA-125 Results of the tumor marker test revealed 20.1   10/19/2018 Imaging   Ct scan of abdomen and pelvis Status post hysterectomy, bilateral salpingo-oophorectomy, and omentectomy.  Two peritoneal implants in the left upper abdomen are stable versus mildly decreased, as above. No abdominopelvic ascites.  No evidence of new/progressive metastatic disease.     11/19/2018 Tumor Marker   Patient's tumor was tested for the following markers: CA-125 Results  of the tumor marker test revealed 21.7   11/23/2018 Echocardiogram   1. The left ventricle has normal systolic function of 45-62%. The cavity size was normal. There is no increased left ventricular wall thickness. Echo evidence of impaired diastolic relaxation.  2. The right ventricle has normal systolic function. The cavity was normal. There is no increase in right ventricular wall thickness.  3. The mitral valve is normal in structure. There is mild mitral annular calcification present.  4. The tricuspid valve is normal in structure.  5. The aortic valve is tricuspid There is mild thickening of the aortic valve.  6. The pulmonic valve was normal in structure. Pulmonic valve regurgitation is mild by color flow Doppler.  7. Normal LV systolic function; mild diastolic dysfunction.   12/31/2018 Tumor Marker   Patient's tumor was tested for the following markers: CA-125 Results of the tumor marker test revealed 17.1   01/31/2019 Imaging   1. Left upper quadrant peritoneal implants described previously have decreased in the interval. No new or progressive findings in the abdomen/pelvis today. No free fluid. 2. Right paraumbilical ventral hernia contains a small knuckle of transverse colon without complicating features. 3.  Aortic Atherosclerois (ICD10-170.0)  Aortic Atherosclerosis (ICD10-I70.0).   01/31/2019 Tumor Marker   Patient's tumor was tested for the following markers: CA-125 Results of the tumor marker test revealed 18.9   02/11/2019 - 03/13/2019 Chemotherapy   The patient is taking Niraparib. She self discontinued after 1 month  02/25/2019 Tumor Marker   Patient's tumor was tested for the following markers: CA-125 Results of the tumor marker test revealed 18.6   05/13/2019 Tumor Marker   Patient's tumor was tested for the following markers: CA-125 Results of the tumor marker test revealed 230   05/22/2019 Imaging   1. Increased size of 4.6 cm soft tissue mass in the gastrosplenic  ligament, consistent with metastatic disease. 2. No other sites of metastatic disease identified within the abdomen or pelvis. 3. Colonic diverticulosis. No radiographic evidence of diverticulitis. 4. Stable small right paraumbilical hernia containing transverse colon.   Aortic Atherosclerosis (ICD10-I70.0).   05/31/2019 - 08/22/2019 Chemotherapy   The patient had bevacizumab and Taxol for chemotherapy treatment.     05/31/2019 Tumor Marker   Patient's tumor was tested for the following markers: CA-125 Results of the tumor marker test revealed 84   06/21/2019 Tumor Marker   Patient's tumor was tested for the following markers: CA-125 Results of the tumor marker test revealed 49.4.   07/19/2019 Tumor Marker   Patient's tumor was tested for the following markers: CA-125. Results of the tumor marker test revealed 44.5   08/22/2019 Imaging   1. Increase in size of 5.0 cm mass in the gastro splenic ligament consistent with metastatic disease. 2. No additional sites of disease identified within the abdomen or pelvis. 3. Unchanged paraumbilical hernia containing nonobstructed loop of large bowel. 4.  Aortic Atherosclerosis (ICD10-I70.0).     08/30/2019 Tumor Marker   Patient's tumor was tested for the following markers: CA-125 Results of the tumor marker test revealed 71.5   08/30/2019 -  Chemotherapy   The patient had gemzar for chemotherapy treatment.     09/27/2019 Tumor Marker   Patient's tumor was tested for the following markers: CA-125. Results of the tumor marker test revealed 38.5   10/14/2019 Tumor Marker   Patient's tumor was tested for the following markers: CA-125 Results of the tumor marker test revealed 40.8   10/21/2019 Tumor Marker   Patient's tumor was tested for the following markers: CA-125 Results of the tumor marker test revealed 34.8.   11/22/2019 Imaging   1. Interval decrease in size of left upper quadrant cystic and solid peritoneal lesion. 2. No new sites  of disease. 3. Unchanged periumbilical hernia containing a nonobstructed loop of small bowel. 4.  Aortic Atherosclerosis (ICD10-I70.0).     11/25/2019 Tumor Marker   Patient's tumor was tested for the following markers: CA-125 Results of the tumor marker test revealed 22.7   12/23/2019 Tumor Marker   Patient's tumor was tested for the following markers: CA-125 Results of the tumor marker test revealed 26.3   01/20/2020 Tumor Marker   Patient's tumor was tested for the following markers: CA-125 Results of the tumor marker test revealed 28.3   02/14/2020 Imaging   1. 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 2. No new or progressive disease identified within the abdomen or pelvis. 3. Colonic diverticulosis. No radiographic evidence of diverticulitis. 4. Small paraumbilical ventral hernia.     02/17/2020 Tumor Marker   Patient's tumor was tested for the following markers: CA-125 Results of the tumor marker test revealed 40.1     REVIEW OF SYSTEMS:   Constitutional: Denies fevers, chills or abnormal weight loss Eyes: Denies blurriness of vision Ears, nose, mouth, throat, and face: Denies mucositis or sore throat Respiratory: Denies cough, dyspnea or wheezes Cardiovascular: Denies palpitation, chest discomfort or lower extremity swelling Gastrointestinal:  Denies nausea, heartburn or change in bowel habits Skin: Denies abnormal skin rashes Lymphatics: Denies new lymphadenopathy or easy bruising Neurological:Denies numbness, tingling or new weaknesses Behavioral/Psych: Mood is stable, no new changes  All other systems were reviewed with the patient and are negative.  I have reviewed the past medical history, past surgical history, social history and family history with the patient and they are unchanged from previous note.  ALLERGIES:  has No Known Allergies.  MEDICATIONS:  Current Outpatient Medications  Medication Sig Dispense Refill   . Cholecalciferol (VITAMIN D) 2000 units CAPS Take 1 capsule by mouth daily.    . ezetimibe (ZETIA) 10 MG tablet Take 10 mg by mouth daily.    . lidocaine-prilocaine (EMLA) cream Apply to affected area once 30 g 3  . morphine (MSIR) 15 MG tablet Take 1 tablet (15 mg total) by mouth every 4 (four) hours as needed for severe pain. 30 tablet 0  . ondansetron (ZOFRAN) 8 MG tablet Take 1 tablet (8 mg total) by mouth 2 (two) times daily as needed (Nausea or vomiting). 30 tablet 1  . prochlorperazine (COMPAZINE) 10 MG tablet Take 1 tablet (10 mg total) by mouth every 6 (six) hours as needed (Nausea or vomiting). 30 tablet 1   No current facility-administered medications for this visit.   Facility-Administered Medications Ordered in Other Visits  Medication Dose Route Frequency Provider Last Rate Last Admin  . gemcitabine (GEMZAR) 1,026 mg in sodium chloride 0.9 % 250 mL chemo infusion  600 mg/m2 (Treatment Plan Recorded) Intravenous Once Gorsuch, Ni, MD      . heparin lock flush 100 unit/mL  500 Units Intracatheter Once PRN Gorsuch, Ni, MD      . sodium chloride flush (NS) 0.9 % injection 10 mL  10 mL Intracatheter PRN Gorsuch, Ni, MD        PHYSICAL EXAMINATION: ECOG PERFORMANCE STATUS: 0 - Asymptomatic  Vitals:   03/17/20 0839  BP: 132/62  Pulse: 72  Resp: 18  Temp: 98 F (36.7 C)  SpO2: 100%   Filed Weights   03/17/20 0839  Weight: 148 lb 12.8 oz (67.5 kg)    GENERAL:alert, no distress and comfortable NEURO: alert & oriented x 3 with fluent speech, no focal motor/sensory deficits  LABORATORY DATA:  I have reviewed the data as listed    Component Value Date/Time   NA 141 03/17/2020 0810   NA 139 06/07/2017 1430   K 4.2 03/17/2020 0810   K 3.8 06/07/2017 1430   CL 107 03/17/2020 0810   CO2 23 03/17/2020 0810   CO2 27 06/07/2017 1430   GLUCOSE 103 (H) 03/17/2020 0810   GLUCOSE 124 06/07/2017 1430   BUN 15 03/17/2020 0810   BUN 14.0 06/07/2017 1430   CREATININE 0.83  03/17/2020 0810   CREATININE 0.78 01/20/2020 1029   CREATININE 0.8 06/07/2017 1430   CALCIUM 9.0 03/17/2020 0810   CALCIUM 9.3 06/07/2017 1430   PROT 6.4 (L) 03/17/2020 0810   PROT 6.6 09/30/2016 1027   ALBUMIN 3.4 (L) 03/17/2020 0810   ALBUMIN 3.5 09/30/2016 1027   AST 19 03/17/2020 0810   AST 20 01/20/2020 1029   AST 18 09/30/2016 1027   ALT 20 03/17/2020 0810   ALT 28 01/20/2020 1029   ALT 21 09/30/2016 1027   ALKPHOS 75 03/17/2020 0810   ALKPHOS 77 09/30/2016 1027   BILITOT 0.2 (L) 03/17/2020 0810   BILITOT 0.2 (L) 01/20/2020 1029   BILITOT 0.37 09/30/2016 1027   GFRNONAA >60   03/17/2020 0810   GFRNONAA >60 01/20/2020 1029   GFRAA >60 03/17/2020 0810   GFRAA >60 01/20/2020 1029    No results found for: SPEP, UPEP  Lab Results  Component Value Date   WBC 4.4 03/17/2020   NEUTROABS 2.8 03/17/2020   HGB 11.1 (L) 03/17/2020   HCT 34.5 (L) 03/17/2020   MCV 94.0 03/17/2020   PLT 239 03/17/2020      Chemistry      Component Value Date/Time   NA 141 03/17/2020 0810   NA 139 06/07/2017 1430   K 4.2 03/17/2020 0810   K 3.8 06/07/2017 1430   CL 107 03/17/2020 0810   CO2 23 03/17/2020 0810   CO2 27 06/07/2017 1430   BUN 15 03/17/2020 0810   BUN 14.0 06/07/2017 1430   CREATININE 0.83 03/17/2020 0810   CREATININE 0.78 01/20/2020 1029   CREATININE 0.8 06/07/2017 1430      Component Value Date/Time   CALCIUM 9.0 03/17/2020 0810   CALCIUM 9.3 06/07/2017 1430   ALKPHOS 75 03/17/2020 0810   ALKPHOS 77 09/30/2016 1027   AST 19 03/17/2020 0810   AST 20 01/20/2020 1029   AST 18 09/30/2016 1027   ALT 20 03/17/2020 0810   ALT 28 01/20/2020 1029   ALT 21 09/30/2016 1027   BILITOT 0.2 (L) 03/17/2020 0810   BILITOT 0.2 (L) 01/20/2020 1029   BILITOT 0.37 09/30/2016 1027      

## 2020-03-17 NOTE — Patient Instructions (Signed)
[facility] Discharge Instructions for Patients Receiving Chemotherapy  Today you received the following chemotherapy agents Gemcitabine.  To help prevent nausea and vomiting after your treatment, we encourage you to take your nausea medication as directed.  If you develop nausea and vomiting that is not controlled by your nausea medication, call the clinic.   BELOW ARE SYMPTOMS THAT SHOULD BE REPORTED IMMEDIATELY:  *FEVER GREATER THAN 100.5 F  *CHILLS WITH OR WITHOUT FEVER  NAUSEA AND VOMITING THAT IS NOT CONTROLLED WITH YOUR NAUSEA MEDICATION  *UNUSUAL SHORTNESS OF BREATH  *UNUSUAL BRUISING OR BLEEDING  TENDERNESS IN MOUTH AND THROAT WITH OR WITHOUT PRESENCE OF ULCERS  *URINARY PROBLEMS  *BOWEL PROBLEMS  UNUSUAL RASH Items with * indicate a potential emergency and should be followed up as soon as possible.  Feel free to call the clinic should you have any questions or concerns. The clinic phone number is [phone].  Please show the CHEMO ALERT CARD at check-in to the Emergency Department and triage nurse.   

## 2020-03-17 NOTE — Assessment & Plan Note (Signed)
Overall, she is doing well with almost no side effects except for anemia I reviewed CT imaging from April 30 with the patient which showed positive response to treatment We will continue treatment every other week indefinitely I plan to lengthen the interval between CT imaging Her next CT scan will be at the end of October of this year 

## 2020-03-18 ENCOUNTER — Telehealth: Payer: Self-pay | Admitting: Hematology and Oncology

## 2020-03-18 NOTE — Telephone Encounter (Signed)
Scheduled per 6/1 sch message. Noted to give pt updated calendar.

## 2020-03-19 ENCOUNTER — Encounter: Attending: Family Medicine | Primary: Family Medicine

## 2020-03-30 ENCOUNTER — Inpatient Hospital Stay: Payer: Medicare Other

## 2020-03-30 ENCOUNTER — Other Ambulatory Visit: Payer: Self-pay

## 2020-03-30 VITALS — BP 134/60 | HR 68 | Temp 97.7°F | Resp 18 | Wt 146.5 lb

## 2020-03-30 DIAGNOSIS — C5702 Malignant neoplasm of left fallopian tube: Secondary | ICD-10-CM

## 2020-03-30 DIAGNOSIS — Z5111 Encounter for antineoplastic chemotherapy: Secondary | ICD-10-CM | POA: Diagnosis not present

## 2020-03-30 DIAGNOSIS — C5700 Malignant neoplasm of unspecified fallopian tube: Secondary | ICD-10-CM

## 2020-03-30 DIAGNOSIS — Z95828 Presence of other vascular implants and grafts: Secondary | ICD-10-CM

## 2020-03-30 DIAGNOSIS — Z7189 Other specified counseling: Secondary | ICD-10-CM

## 2020-03-30 LAB — CBC WITH DIFFERENTIAL/PLATELET
Abs Immature Granulocytes: 0.02 10*3/uL (ref 0.00–0.07)
Basophils Absolute: 0 10*3/uL (ref 0.0–0.1)
Basophils Relative: 1 %
Eosinophils Absolute: 0.2 10*3/uL (ref 0.0–0.5)
Eosinophils Relative: 4 %
HCT: 33.7 % — ABNORMAL LOW (ref 36.0–46.0)
Hemoglobin: 11.1 g/dL — ABNORMAL LOW (ref 12.0–15.0)
Immature Granulocytes: 1 %
Lymphocytes Relative: 20 %
Lymphs Abs: 0.8 10*3/uL (ref 0.7–4.0)
MCH: 30.9 pg (ref 26.0–34.0)
MCHC: 32.9 g/dL (ref 30.0–36.0)
MCV: 93.9 fL (ref 80.0–100.0)
Monocytes Absolute: 0.8 10*3/uL (ref 0.1–1.0)
Monocytes Relative: 18 %
Neutro Abs: 2.4 10*3/uL (ref 1.7–7.7)
Neutrophils Relative %: 56 %
Platelets: 192 10*3/uL (ref 150–400)
RBC: 3.59 MIL/uL — ABNORMAL LOW (ref 3.87–5.11)
RDW: 14.7 % (ref 11.5–15.5)
WBC: 4.2 10*3/uL (ref 4.0–10.5)
nRBC: 0 % (ref 0.0–0.2)

## 2020-03-30 LAB — COMPREHENSIVE METABOLIC PANEL
ALT: 21 U/L (ref 0–44)
AST: 20 U/L (ref 15–41)
Albumin: 3.4 g/dL — ABNORMAL LOW (ref 3.5–5.0)
Alkaline Phosphatase: 69 U/L (ref 38–126)
Anion gap: 11 (ref 5–15)
BUN: 15 mg/dL (ref 8–23)
CO2: 24 mmol/L (ref 22–32)
Calcium: 9.2 mg/dL (ref 8.9–10.3)
Chloride: 107 mmol/L (ref 98–111)
Creatinine, Ser: 0.82 mg/dL (ref 0.44–1.00)
GFR calc Af Amer: >60 mL/min (ref >60)
GFR calc non Af Amer: >60 mL/min (ref >60)
Glucose, Bld: 89 mg/dL (ref 70–99)
Potassium: 4.3 mmol/L (ref 3.5–5.1)
Sodium: 142 mmol/L (ref 135–145)
Total Bilirubin: 0.3 mg/dL (ref 0.3–1.2)
Total Protein: 6.4 g/dL — ABNORMAL LOW (ref 6.5–8.1)

## 2020-03-30 MED ORDER — SODIUM CHLORIDE 0.9% FLUSH
10.0000 mL | Freq: Once | INTRAVENOUS | Status: AC
  Administered 2020-03-30: 10 mL
  Filled 2020-03-30: qty 10

## 2020-03-30 MED ORDER — HEPARIN SOD (PORK) LOCK FLUSH 100 UNIT/ML IV SOLN
500.0000 [IU] | Freq: Once | INTRAVENOUS | Status: AC | PRN
  Administered 2020-03-30: 500 [IU]
  Filled 2020-03-30: qty 5

## 2020-03-30 MED ORDER — PROCHLORPERAZINE MALEATE 10 MG PO TABS
ORAL_TABLET | ORAL | Status: AC
  Filled 2020-03-30: qty 1

## 2020-03-30 MED ORDER — SODIUM CHLORIDE 0.9 % IV SOLN
600.0000 mg/m2 | Freq: Once | INTRAVENOUS | Status: AC
  Administered 2020-03-30: 1026 mg via INTRAVENOUS
  Filled 2020-03-30: qty 26.98

## 2020-03-30 MED ORDER — SODIUM CHLORIDE 0.9 % IV SOLN
Freq: Once | INTRAVENOUS | Status: AC
  Filled 2020-03-30: qty 250

## 2020-03-30 MED ORDER — PROCHLORPERAZINE MALEATE 10 MG PO TABS
10.0000 mg | ORAL_TABLET | Freq: Once | ORAL | Status: AC
  Administered 2020-03-30: 10 mg via ORAL

## 2020-03-30 MED ORDER — SODIUM CHLORIDE 0.9% FLUSH
10.0000 mL | INTRAVENOUS | Status: DC | PRN
  Administered 2020-03-30: 10 mL
  Filled 2020-03-30: qty 10

## 2020-03-30 NOTE — Patient Instructions (Signed)
[facility] Discharge Instructions for Patients Receiving Chemotherapy  Today you received the following chemotherapy agents: gemcitabine.  To help prevent nausea and vomiting after your treatment, we encourage you to take your nausea medication as directed.   If you develop nausea and vomiting that is not controlled by your nausea medication, call the clinic.   BELOW ARE SYMPTOMS THAT SHOULD BE REPORTED IMMEDIATELY:  *FEVER GREATER THAN 100.5 F  *CHILLS WITH OR WITHOUT FEVER  NAUSEA AND VOMITING THAT IS NOT CONTROLLED WITH YOUR NAUSEA MEDICATION  *UNUSUAL SHORTNESS OF BREATH  *UNUSUAL BRUISING OR BLEEDING  TENDERNESS IN MOUTH AND THROAT WITH OR WITHOUT PRESENCE OF ULCERS  *URINARY PROBLEMS  *BOWEL PROBLEMS  UNUSUAL RASH Items with * indicate a potential emergency and should be followed up as soon as possible.  Feel free to call the clinic should you have any questions or concerns. The clinic phone number is [phone].  Please show the CHEMO ALERT CARD at check-in to the Emergency Department and triage nurse.   

## 2020-04-08 ENCOUNTER — Telehealth: Payer: Self-pay | Admitting: Oncology

## 2020-04-08 NOTE — Telephone Encounter (Signed)
Jessica Johnston called and said they have a funeral in July in [place] and is wondering if it would be safe for her to stay in a hotel.  She is vaccinated but wanted to check with Dr. Alvy Bimler to see what she thinks.

## 2020-04-08 NOTE — Telephone Encounter (Signed)
[facility] and advised her of message below.

## 2020-04-08 NOTE — Telephone Encounter (Signed)
Yes that is ok

## 2020-04-13 ENCOUNTER — Inpatient Hospital Stay: Payer: Medicare Other

## 2020-04-13 ENCOUNTER — Inpatient Hospital Stay (HOSPITAL_BASED_OUTPATIENT_CLINIC_OR_DEPARTMENT_OTHER): Payer: Medicare Other | Admitting: Hematology and Oncology

## 2020-04-13 ENCOUNTER — Encounter: Payer: Self-pay | Admitting: Hematology and Oncology

## 2020-04-13 ENCOUNTER — Other Ambulatory Visit: Payer: Self-pay

## 2020-04-13 VITALS — Temp 97.9°F

## 2020-04-13 DIAGNOSIS — C5702 Malignant neoplasm of left fallopian tube: Secondary | ICD-10-CM

## 2020-04-13 DIAGNOSIS — T451X5A Adverse effect of antineoplastic and immunosuppressive drugs, initial encounter: Secondary | ICD-10-CM | POA: Diagnosis not present

## 2020-04-13 DIAGNOSIS — Z95828 Presence of other vascular implants and grafts: Secondary | ICD-10-CM

## 2020-04-13 DIAGNOSIS — C5700 Malignant neoplasm of unspecified fallopian tube: Secondary | ICD-10-CM

## 2020-04-13 DIAGNOSIS — Z299 Encounter for prophylactic measures, unspecified: Secondary | ICD-10-CM

## 2020-04-13 DIAGNOSIS — D6481 Anemia due to antineoplastic chemotherapy: Secondary | ICD-10-CM | POA: Diagnosis not present

## 2020-04-13 DIAGNOSIS — Z7189 Other specified counseling: Secondary | ICD-10-CM

## 2020-04-13 DIAGNOSIS — Z5111 Encounter for antineoplastic chemotherapy: Secondary | ICD-10-CM | POA: Diagnosis not present

## 2020-04-13 LAB — CBC WITH DIFFERENTIAL/PLATELET
Abs Immature Granulocytes: 0.03 10*3/uL (ref 0.00–0.07)
Basophils Absolute: 0 10*3/uL (ref 0.0–0.1)
Basophils Relative: 1 %
Eosinophils Absolute: 0.1 10*3/uL (ref 0.0–0.5)
Eosinophils Relative: 2 %
HCT: 34.2 % — ABNORMAL LOW (ref 36.0–46.0)
Hemoglobin: 11.2 g/dL — ABNORMAL LOW (ref 12.0–15.0)
Immature Granulocytes: 1 %
Lymphocytes Relative: 17 %
Lymphs Abs: 0.9 10*3/uL (ref 0.7–4.0)
MCH: 30.7 pg (ref 26.0–34.0)
MCHC: 32.7 g/dL (ref 30.0–36.0)
MCV: 93.7 fL (ref 80.0–100.0)
Monocytes Absolute: 0.8 10*3/uL (ref 0.1–1.0)
Monocytes Relative: 16 %
Neutro Abs: 3.2 10*3/uL (ref 1.7–7.7)
Neutrophils Relative %: 63 %
Platelets: 201 10*3/uL (ref 150–400)
RBC: 3.65 MIL/uL — ABNORMAL LOW (ref 3.87–5.11)
RDW: 14.8 % (ref 11.5–15.5)
WBC: 5.1 10*3/uL (ref 4.0–10.5)
nRBC: 0 % (ref 0.0–0.2)

## 2020-04-13 LAB — COMPREHENSIVE METABOLIC PANEL
ALT: 23 U/L (ref 0–44)
AST: 20 U/L (ref 15–41)
Albumin: 3.6 g/dL (ref 3.5–5.0)
Alkaline Phosphatase: 78 U/L (ref 38–126)
Anion gap: 11 (ref 5–15)
BUN: 16 mg/dL (ref 8–23)
CO2: 22 mmol/L (ref 22–32)
Calcium: 9.1 mg/dL (ref 8.9–10.3)
Chloride: 107 mmol/L (ref 98–111)
Creatinine, Ser: 0.82 mg/dL (ref 0.44–1.00)
GFR calc Af Amer: >60 mL/min (ref >60)
GFR calc non Af Amer: >60 mL/min (ref >60)
Glucose, Bld: 103 mg/dL — ABNORMAL HIGH (ref 70–99)
Potassium: 4.8 mmol/L (ref 3.5–5.1)
Sodium: 140 mmol/L (ref 135–145)
Total Bilirubin: 0.2 mg/dL — ABNORMAL LOW (ref 0.3–1.2)
Total Protein: 6.8 g/dL (ref 6.5–8.1)

## 2020-04-13 MED ORDER — PROCHLORPERAZINE MALEATE 10 MG PO TABS
10.0000 mg | ORAL_TABLET | Freq: Once | ORAL | Status: AC
  Administered 2020-04-13: 10 mg via ORAL

## 2020-04-13 MED ORDER — SODIUM CHLORIDE 0.9% FLUSH
10.0000 mL | INTRAVENOUS | Status: DC | PRN
  Administered 2020-04-13: 10 mL
  Filled 2020-04-13: qty 10

## 2020-04-13 MED ORDER — PROCHLORPERAZINE MALEATE 10 MG PO TABS
ORAL_TABLET | ORAL | Status: AC
  Filled 2020-04-13: qty 1

## 2020-04-13 MED ORDER — SODIUM CHLORIDE 0.9% FLUSH
10.0000 mL | Freq: Once | INTRAVENOUS | Status: AC
  Administered 2020-04-13: 10 mL
  Filled 2020-04-13: qty 10

## 2020-04-13 MED ORDER — SODIUM CHLORIDE 0.9 % IV SOLN
600.0000 mg/m2 | Freq: Once | INTRAVENOUS | Status: AC
  Administered 2020-04-13: 1026 mg via INTRAVENOUS
  Filled 2020-04-13: qty 26.98

## 2020-04-13 MED ORDER — HEPARIN SOD (PORK) LOCK FLUSH 100 UNIT/ML IV SOLN
500.0000 [IU] | Freq: Once | INTRAVENOUS | Status: AC | PRN
  Administered 2020-04-13: 500 [IU]
  Filled 2020-04-13: qty 5

## 2020-04-13 MED ORDER — SODIUM CHLORIDE 0.9 % IV SOLN
Freq: Once | INTRAVENOUS | Status: AC
  Filled 2020-04-13: qty 250

## 2020-04-13 NOTE — Assessment & Plan Note (Signed)
This is likely due to recent treatment.  She is not symptomatic We will continue treatment as scheduled

## 2020-04-13 NOTE — Progress Notes (Signed)
[facility] OFFICE PROGRESS NOTE  Patient Care Team: [name], MD as PCP - General (Internal Medicine)  ASSESSMENT & PLAN:  Fallopian tube cancer, carcinoma ([place]) Overall, she is doing well with almost no side effects except for anemia I reviewed CT imaging from April 30 with the patient which showed positive response to treatment We will continue treatment every other week indefinitely I plan to lengthen the interval between CT imaging Her next CT scan will be at the end of October of this year  Anemia due to antineoplastic chemotherapy This is likely due to recent treatment.  She is not symptomatic We will continue treatment as scheduled  Preventive measure The patient is planning to attend a funeral next week We discussed preventative measure and social distancing while she is receiving treatment   No orders of the defined types were placed in this encounter.   All questions were answered. The patient knows to call the clinic with any problems, questions or concerns. The total time spent in the appointment was 20 minutes encounter with patients including review of chart and various tests results, discussions about plan of care and coordination of care plan   [name], MD 04/13/2020 2:58 PM  INTERVAL HISTORY: Please see below for problem oriented charting. 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 Extensive omental nodularity highly worrisome for peritoneal carcinomatosis. Late recurrence of melanoma can present as peritoneal carcinomatosis. Ovarian cancer more commonly presents in this manner. Patient does have a predominately low density right adnexal lesion measuring up to 3.5 cm, although this lesion is not typical for ovarian cancer. 2. No evidence of bowel or ureteral obstruction. 3. No other definite signs of metastatic disease. There are small indeterminate low-density hepatic lesions.   02/08/2016 Tumor Marker   Patient's tumor was tested for the following markers: CA-125 Results of the tumor marker test revealed 656.2   02/15/2016 Procedure   CT-guided core biopsy performed of omental mass just deep to the abdominal wall.   02/15/2016 Pathology Results   Omentum, biopsy, left - PAPILLARY SEROUS NEOPLASM, SEE COMMENT. Microscopic Comment There are papillary collections of largely low grade appearing cells with numerous psammoma bodies. Given the limited material it is difficult to distinguish between invasive implants of a serous borderline tumor and serous carcinoma, especially given the low grade appearance.   02/18/2016 Pathology Results   1. Omentum, resection for tumor INVASIVE IMPLANT OF HIGH GRADE SEROUS CARCINOMA 2. Uterus +/- tubes/ovaries, neoplastic HIGH GRADE SEROUS CARCINOMA INVOLVING LEFT TUBAL FIMBRIA SEROUS CARCINOMA WITH PREDOMINANT PSAMMOMA BODIES IMPLANT AT UTERUS SEROSA, BILATERAL FALLOPIAN TUBAL SEROSA, AND ANTERIOR PERITONEAL REFLECTION CERVIX: HISTOLOGICAL UNREMARKABLE ENDOMETRIUM: INACTIVE ENDOMETRIUM MYOMETRIUM: LEIOMYOMA LEFT OVARY: CYSTADENOFIBROMA RIGHT OVARY AND FALLOPIAN TUBE: HISTOLOGICAL UNREMARKABLE Microscopic Comment 2. ONCOLOGY TABLE - FALLOPIAN TUBE 1.  Specimen, including  laterality: Omentum, uterus, bilateral ovaries and fallopian tubes 2. Procedure: Hysterectomy, bilateral salpingo-oophorectomy and tumor debulking omentectomy 3. Lymph node sampling performed: No 4. Tumor site: uterus serosa, bilateral fallopian tubal serosa, peritoneal and omentum 5. Tumor location in fallopian tube: Left fallopian tubal fimbria 6. Specimen integrity (intact/ruptured/disrupted): Intact 7. Tumor size (cm): multi focal invasive omentum implant greater than 2 cm, left fallopian tube tumor 0.8 cm. 8. Histologic type: Serous carcinoma 9. Grade: 3 10. Microscopic tumor extension: uterus serosa, bilateral fallopian tubal serosa, peritoneal and omentum 11. Margins: NA 12. Lymph-Vascular invasion: identified 13. Lymph nodes: # examined: 0; # positive: NA 14. TNM: pT3c, pNx 15. FIGO Stage (based on pathologic findings, needs clinical correlation: IIIC 16. Comment: High grade serous carcinoma multifocally and extensively involves left fallopian tubal fimbria, omentum, uterus serosa, bilateral fallopian tubal serosa and peritoneal. At the left fallopian tube there are small foci of serous tubal intraepithelial carcinoma identified, so we conclude the carcinoma is fallopian tube origin   03/09/2016 Tumor Marker   Patient's tumor was tested for the following markers: CA-125 Results of the tumor marker test revealed 154.4   03/15/2016 Procedure   Technically successful right IJ power-injectable port catheter placement. Ready for routine use.   03/18/2016 - 07/13/2016 Chemotherapy   The patient had 6 cycles of carboplatin and taxol   04/14/2016 Tumor Marker   Patient's tumor was tested for the following markers: CA-125 Results of the tumor marker test revealed 36.7   04/25/2016 Genetic Testing   Patient has genetic testing done for germline mutation Results revealed patient has no mutation   04/28/2016 Tumor Marker   Patient's tumor was tested for the following markers: CA-125 Results  of the tumor marker test revealed 24.6   06/21/2016 Tumor Marker   Patient's tumor was tested for the following markers: CA-125 Results of the tumor marker test revealed 16.6   08/01/2016 Tumor Marker   Patient's tumor was tested for the following markers: CA-125 Results of the tumor marker test revealed 17.2   08/05/2016 Imaging   Interval TAH-BSO. No evidence of residual pelvic mass or metastatic disease within the abdomen or pelvis. No other acute findings.    11/04/2016 Tumor Marker   Patient's tumor was tested for the following markers: CA-125 Results of the tumor marker test revealed 13.6   01/20/2017 Tumor Marker   Patient's tumor was tested for the following markers: CA-125 Results of the tumor marker test revealed 13.8   04/14/2017 Tumor Marker   Patient's tumor was tested for the following markers: CA-125 Results of the tumor marker test revealed 16.1   06/13/2017 Imaging   No acute findings.  No mass or hernia identified.  Colonic diverticulosis, without radiographic evidence of diverticulitis.  Aortic atherosclerosis.   07/03/2017 Tumor Marker   Patient's tumor was tested for the following markers: CA-125 Results of the tumor marker test revealed 18.4   09/19/2017 Tumor Marker   Patient's tumor was tested for the following markers: CA-125 Results of the tumor marker test revealed 18.5   01/23/2018 Tumor Marker   Patient's tumor was tested for the following markers: CA-125 Results of the tumor marker test revealed 35.4   01/30/2018 Imaging   1. No evidence of local fallopian tube carcinoma recurrence within the pelvis. Post hysterectomy. 2. No evidence of metastatic peritoneal disease or omental disease. No solid organ metastasis.    03/20/2018 Tumor Marker   Patient's tumor was tested for the following markers: CA-125 Results of the tumor  marker test revealed 73   05/02/2018 Tumor Marker   Patient's tumor was tested for the following markers: CA-125 Results of  the tumor marker test revealed 127.1   05/08/2018 Imaging   CT abdomen and pelvis 1. 7 cm left subdiaphragmatic collection of loculated fluid versus low-density soft tissue. Given the patient's history of rising CA 125, metastatic disease considered highly likely.  2. Areas of fluid or recurrent disease identified in the right lower quadrant adjacent to the cecum and along right lower quadrant small bowel loops.   05/14/2018 Tumor Marker   Patient's tumor was tested for the following markers: CA-125 Results of the tumor marker test revealed 166    Genetic Testing   Patient has genetic testing done for ER/PR and MMR. Results revealed patient has the following on 02/18/2016 surgical pathology: ER 20%, PR 0% MMR: normal    Genetic Testing   Patient has genetic testing done for MSI. Results revealed patient has the following: MSI stable   05/18/2018 - 12/31/2018 Chemotherapy   The patient had carboplatin and Doxil   07/16/2018 Tumor Marker   Patient's tumor was tested for the following markers: CA-125 Results of the tumor marker test revealed 28.7   08/10/2018 Imaging   CT imaging:  Decreased size of peritoneal soft tissue masses in left upper quadrant, consistent with decreased metastatic disease.  No new or progressive metastatic disease. No other acute findings.  Colonic diverticulosis, without radiographic evidence of diverticulitis.  Stable small paraumbilical ventral hernia containing transverse colon.   08/13/2018 Tumor Marker   Patient's tumor was tested for the following markers: CA-125 Results of the tumor marker test revealed 21.6   08/24/2018 Echocardiogram   LV EF: 60% -  65%   10/15/2018 Tumor Marker   Patient's tumor was tested for the following markers: CA-125 Results of the tumor marker test revealed 20.1   10/19/2018 Imaging   Ct scan of abdomen and pelvis Status post hysterectomy, bilateral salpingo-oophorectomy, and omentectomy.  Two peritoneal  implants in the left upper abdomen are stable versus mildly decreased, as above. No abdominopelvic ascites.  No evidence of new/progressive metastatic disease.     11/19/2018 Tumor Marker   Patient's tumor was tested for the following markers: CA-125 Results of the tumor marker test revealed 21.7   11/23/2018 Echocardiogram   1. The left ventricle has normal systolic function of 65-78%. The cavity size was normal. There is no increased left ventricular wall thickness. Echo evidence of impaired diastolic relaxation.  2. The right ventricle has normal systolic function. The cavity was normal. There is no increase in right ventricular wall thickness.  3. The mitral valve is normal in structure. There is mild mitral annular calcification present.  4. The tricuspid valve is normal in structure.  5. The aortic valve is tricuspid There is mild thickening of the aortic valve.  6. The pulmonic valve was normal in structure. Pulmonic valve regurgitation is mild by color flow Doppler.  7. Normal LV systolic function; mild diastolic dysfunction.   12/31/2018 Tumor Marker   Patient's tumor was tested for the following markers: CA-125 Results of the tumor marker test revealed 17.1   01/31/2019 Imaging   1. Left upper quadrant peritoneal implants described previously have decreased in the interval. No new or progressive findings in the abdomen/pelvis today. No free fluid. 2. Right paraumbilical ventral hernia contains a small knuckle of transverse colon without complicating features. 3.  Aortic Atherosclerois (ICD10-170.0)  Aortic Atherosclerosis (ICD10-I70.0).   01/31/2019 Tumor Marker  Patient's tumor was tested for the following markers: CA-125 Results of the tumor marker test revealed 18.9   02/11/2019 - 03/13/2019 Chemotherapy   The patient is taking Niraparib. She self discontinued after 1 month   02/25/2019 Tumor Marker   Patient's tumor was tested for the following markers: CA-125 Results  of the tumor marker test revealed 18.6   05/13/2019 Tumor Marker   Patient's tumor was tested for the following markers: CA-125 Results of the tumor marker test revealed 230   05/22/2019 Imaging   1. Increased size of 4.6 cm soft tissue mass in the gastrosplenic ligament, consistent with metastatic disease. 2. No other sites of metastatic disease identified within the abdomen or pelvis. 3. Colonic diverticulosis. No radiographic evidence of diverticulitis. 4. Stable small right paraumbilical hernia containing transverse colon.   Aortic Atherosclerosis (ICD10-I70.0).   05/31/2019 - 08/22/2019 Chemotherapy   The patient had bevacizumab and Taxol for chemotherapy treatment.     05/31/2019 Tumor Marker   Patient's tumor was tested for the following markers: CA-125 Results of the tumor marker test revealed 84   06/21/2019 Tumor Marker   Patient's tumor was tested for the following markers: CA-125 Results of the tumor marker test revealed 49.4.   07/19/2019 Tumor Marker   Patient's tumor was tested for the following markers: CA-125. Results of the tumor marker test revealed 44.5   08/22/2019 Imaging   1. Increase in size of 5.0 cm mass in the gastro splenic ligament consistent with metastatic disease. 2. No additional sites of disease identified within the abdomen or pelvis. 3. Unchanged paraumbilical hernia containing nonobstructed loop of large bowel. 4.  Aortic Atherosclerosis (ICD10-I70.0).     08/30/2019 Tumor Marker   Patient's tumor was tested for the following markers: CA-125 Results of the tumor marker test revealed 71.5   08/30/2019 -  Chemotherapy   The patient had gemzar for chemotherapy treatment.     09/27/2019 Tumor Marker   Patient's tumor was tested for the following markers: CA-125. Results of the tumor marker test revealed 38.5   10/14/2019 Tumor Marker   Patient's tumor was tested for the following markers: CA-125 Results of the tumor marker test revealed 40.8    10/21/2019 Tumor Marker   Patient's tumor was tested for the following markers: CA-125 Results of the tumor marker test revealed 34.8.   11/22/2019 Imaging   1. Interval decrease in size of left upper quadrant cystic and solid peritoneal lesion. 2. No new sites of disease. 3. Unchanged periumbilical hernia containing a nonobstructed loop of small bowel. 4.  Aortic Atherosclerosis (ICD10-I70.0).     11/25/2019 Tumor Marker   Patient's tumor was tested for the following markers: CA-125 Results of the tumor marker test revealed 22.7   12/23/2019 Tumor Marker   Patient's tumor was tested for the following markers: CA-125 Results of the tumor marker test revealed 26.3   01/20/2020 Tumor Marker   Patient's tumor was tested for the following markers: CA-125 Results of the tumor marker test revealed 28.3   02/14/2020 Imaging   1. 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 2. No new or progressive disease identified within the abdomen or pelvis. 3. Colonic diverticulosis. No radiographic evidence of diverticulitis. 4. Small paraumbilical ventral hernia.     02/17/2020 Tumor Marker   Patient's tumor was tested for the following markers: CA-125 Results of the tumor marker test revealed 40.1     REVIEW OF SYSTEMS:   Constitutional: Denies fevers,  chills or abnormal weight loss Eyes: Denies blurriness of vision Ears, nose, mouth, throat, and face: Denies mucositis or sore throat Respiratory: Denies cough, dyspnea or wheezes Cardiovascular: Denies palpitation, chest discomfort or lower extremity swelling Gastrointestinal:  Denies nausea, heartburn or change in bowel habits Skin: Denies abnormal skin rashes Lymphatics: Denies new lymphadenopathy or easy bruising Neurological:Denies numbness, tingling or new weaknesses Behavioral/Psych: Mood is stable, no new changes  All other systems were reviewed with the patient and are negative.  I have reviewed  the past medical history, past surgical history, social history and family history with the patient and they are unchanged from previous note.  ALLERGIES:  has No Known Allergies.  MEDICATIONS:  Current Outpatient Medications  Medication Sig Dispense Refill  . Cholecalciferol (VITAMIN D) 2000 units CAPS Take 1 capsule by mouth daily.    Marland Kitchen ezetimibe (ZETIA) 10 MG tablet Take 10 mg by mouth daily.    Marland Kitchen lidocaine-prilocaine (EMLA) cream Apply to affected area once 30 g 3  . morphine (MSIR) 15 MG tablet Take 1 tablet (15 mg total) by mouth every 4 (four) hours as needed for severe pain. 30 tablet 0  . ondansetron (ZOFRAN) 8 MG tablet Take 1 tablet (8 mg total) by mouth 2 (two) times daily as needed (Nausea or vomiting). 30 tablet 1  . prochlorperazine (COMPAZINE) 10 MG tablet Take 1 tablet (10 mg total) by mouth every 6 (six) hours as needed (Nausea or vomiting). 30 tablet 1   No current facility-administered medications for this visit.   Facility-Administered Medications Ordered in Other Visits  Medication Dose Route Frequency Provider Last Rate Last Admin  . sodium chloride flush (NS) 0.9 % injection 10 mL  10 mL Intracatheter PRN Alvy Bimler, Jayvon Mounger, MD   10 mL at 04/13/20 1335    PHYSICAL EXAMINATION: ECOG PERFORMANCE STATUS: 1 - Symptomatic but completely ambulatory  Vitals:   04/13/20 1144  BP: 116/76  Pulse: 63  Resp: 18  SpO2: 100%   Filed Weights   04/13/20 1144  Weight: 148 lb 9.6 oz (67.4 kg)    GENERAL:alert, no distress and comfortable SKIN: skin color, texture, turgor are normal, no rashes or significant lesions EYES: normal, Conjunctiva are pink and non-injected, sclera clear OROPHARYNX:no exudate, no erythema and lips, buccal mucosa, and tongue normal  NECK: supple, thyroid normal size, non-tender, without nodularity LYMPH:  no palpable lymphadenopathy in the cervical, axillary or inguinal LUNGS: clear to auscultation and percussion with normal breathing effort HEART:  regular rate & rhythm and no murmurs and no lower extremity edema ABDOMEN:abdomen soft, non-tender and normal bowel sounds Musculoskeletal:no cyanosis of digits and no clubbing  NEURO: alert & oriented x 3 with fluent speech, no focal motor/sensory deficits  LABORATORY DATA:  I have reviewed the data as listed    Component Value Date/Time   NA 140 04/13/2020 1117   NA 139 06/07/2017 1430   K 4.8 04/13/2020 1117   K 3.8 06/07/2017 1430   CL 107 04/13/2020 1117   CO2 22 04/13/2020 1117   CO2 27 06/07/2017 1430   GLUCOSE 103 (H) 04/13/2020 1117   GLUCOSE 124 06/07/2017 1430   BUN 16 04/13/2020 1117   BUN 14.0 06/07/2017 1430   CREATININE 0.82 04/13/2020 1117   CREATININE 0.78 01/20/2020 1029   CREATININE 0.8 06/07/2017 1430   CALCIUM 9.1 04/13/2020 1117   CALCIUM 9.3 06/07/2017 1430   PROT 6.8 04/13/2020 1117   PROT 6.6 09/30/2016 1027   ALBUMIN 3.6 04/13/2020 1117  ALBUMIN 3.5 09/30/2016 1027   AST 20 04/13/2020 1117   AST 20 01/20/2020 1029   AST 18 09/30/2016 1027   ALT 23 04/13/2020 1117   ALT 28 01/20/2020 1029   ALT 21 09/30/2016 1027   ALKPHOS 78 04/13/2020 1117   ALKPHOS 77 09/30/2016 1027   BILITOT 0.2 (L) 04/13/2020 1117   BILITOT 0.2 (L) 01/20/2020 1029   BILITOT 0.37 09/30/2016 1027   GFRNONAA >60 04/13/2020 1117   GFRNONAA >60 01/20/2020 1029   GFRAA >60 04/13/2020 1117   GFRAA >60 01/20/2020 1029    No results found for: SPEP, UPEP  Lab Results  Component Value Date   WBC 5.1 04/13/2020   NEUTROABS 3.2 04/13/2020   HGB 11.2 (L) 04/13/2020   HCT 34.2 (L) 04/13/2020   MCV 93.7 04/13/2020   PLT 201 04/13/2020      Chemistry      Component Value Date/Time   NA 140 04/13/2020 1117   NA 139 06/07/2017 1430   K 4.8 04/13/2020 1117   K 3.8 06/07/2017 1430   CL 107 04/13/2020 1117   CO2 22 04/13/2020 1117   CO2 27 06/07/2017 1430   BUN 16 04/13/2020 1117   BUN 14.0 06/07/2017 1430   CREATININE 0.82 04/13/2020 1117   CREATININE 0.78 01/20/2020  1029   CREATININE 0.8 06/07/2017 1430      Component Value Date/Time   CALCIUM 9.1 04/13/2020 1117   CALCIUM 9.3 06/07/2017 1430   ALKPHOS 78 04/13/2020 1117   ALKPHOS 77 09/30/2016 1027   AST 20 04/13/2020 1117   AST 20 01/20/2020 1029   AST 18 09/30/2016 1027   ALT 23 04/13/2020 1117   ALT 28 01/20/2020 1029   ALT 21 09/30/2016 1027   BILITOT 0.2 (L) 04/13/2020 1117   BILITOT 0.2 (L) 01/20/2020 1029   BILITOT 0.37 09/30/2016 1027

## 2020-04-13 NOTE — Assessment & Plan Note (Signed)
Overall, she is doing well with almost no side effects except for anemia I reviewed CT imaging from April 30 with the patient which showed positive response to treatment We will continue treatment every other week indefinitely I plan to lengthen the interval between CT imaging Her next CT scan will be at the end of October of this year

## 2020-04-13 NOTE — Patient Instructions (Signed)
[facility] Discharge Instructions for Patients Receiving Chemotherapy  Today you received the following chemotherapy agents: gemcitabine.  To help prevent nausea and vomiting after your treatment, we encourage you to take your nausea medication as directed.   If you develop nausea and vomiting that is not controlled by your nausea medication, call the clinic.   BELOW ARE SYMPTOMS THAT SHOULD BE REPORTED IMMEDIATELY:  *FEVER GREATER THAN 100.5 F  *CHILLS WITH OR WITHOUT FEVER  NAUSEA AND VOMITING THAT IS NOT CONTROLLED WITH YOUR NAUSEA MEDICATION  *UNUSUAL SHORTNESS OF BREATH  *UNUSUAL BRUISING OR BLEEDING  TENDERNESS IN MOUTH AND THROAT WITH OR WITHOUT PRESENCE OF ULCERS  *URINARY PROBLEMS  *BOWEL PROBLEMS  UNUSUAL RASH Items with * indicate a potential emergency and should be followed up as soon as possible.  Feel free to call the clinic should you have any questions or concerns. The clinic phone number is [phone].  Please show the CHEMO ALERT CARD at check-in to the Emergency Department and triage nurse.   

## 2020-04-13 NOTE — Patient Instructions (Signed)

## 2020-04-13 NOTE — Assessment & Plan Note (Signed)
The patient is planning to attend a funeral next week We discussed preventative measure and social distancing while she is receiving treatment

## 2020-04-15 ENCOUNTER — Encounter: Payer: MEDICARE | Primary: Family Medicine

## 2020-04-17 ENCOUNTER — Encounter

## 2020-04-17 ENCOUNTER — Inpatient Hospital Stay: Admit: 2020-04-17 | Payer: MEDICARE | Attending: Family Medicine | Primary: Family Medicine

## 2020-04-17 DIAGNOSIS — R928 Other abnormal and inconclusive findings on diagnostic imaging of breast: Secondary | ICD-10-CM

## 2020-04-27 ENCOUNTER — Other Ambulatory Visit: Payer: Self-pay

## 2020-04-27 ENCOUNTER — Inpatient Hospital Stay: Payer: Medicare Other

## 2020-04-27 ENCOUNTER — Inpatient Hospital Stay: Payer: Medicare Other | Attending: Gynecologic Oncology

## 2020-04-27 VITALS — BP 123/77 | HR 73 | Temp 98.1°F | Resp 16 | Wt 148.5 lb

## 2020-04-27 DIAGNOSIS — C5702 Malignant neoplasm of left fallopian tube: Secondary | ICD-10-CM

## 2020-04-27 DIAGNOSIS — Z5111 Encounter for antineoplastic chemotherapy: Secondary | ICD-10-CM | POA: Insufficient documentation

## 2020-04-27 DIAGNOSIS — Z95828 Presence of other vascular implants and grafts: Secondary | ICD-10-CM

## 2020-04-27 DIAGNOSIS — C5700 Malignant neoplasm of unspecified fallopian tube: Secondary | ICD-10-CM

## 2020-04-27 DIAGNOSIS — Z7189 Other specified counseling: Secondary | ICD-10-CM

## 2020-04-27 LAB — CBC WITH DIFFERENTIAL/PLATELET
Abs Immature Granulocytes: 0.04 10*3/uL (ref 0.00–0.07)
Basophils Absolute: 0 10*3/uL (ref 0.0–0.1)
Basophils Relative: 1 %
Eosinophils Absolute: 0.2 10*3/uL (ref 0.0–0.5)
Eosinophils Relative: 5 %
HCT: 32.7 % — ABNORMAL LOW (ref 36.0–46.0)
Hemoglobin: 10.7 g/dL — ABNORMAL LOW (ref 12.0–15.0)
Immature Granulocytes: 1 %
Lymphocytes Relative: 18 %
Lymphs Abs: 0.8 10*3/uL (ref 0.7–4.0)
MCH: 31 pg (ref 26.0–34.0)
MCHC: 32.7 g/dL (ref 30.0–36.0)
MCV: 94.8 fL (ref 80.0–100.0)
Monocytes Absolute: 0.6 10*3/uL (ref 0.1–1.0)
Monocytes Relative: 13 %
Neutro Abs: 2.8 10*3/uL (ref 1.7–7.7)
Neutrophils Relative %: 62 %
Platelets: 189 10*3/uL (ref 150–400)
RBC: 3.45 MIL/uL — ABNORMAL LOW (ref 3.87–5.11)
RDW: 14.7 % (ref 11.5–15.5)
WBC: 4.4 10*3/uL (ref 4.0–10.5)
nRBC: 0 % (ref 0.0–0.2)

## 2020-04-27 LAB — COMPREHENSIVE METABOLIC PANEL
ALT: 24 U/L (ref 0–44)
AST: 20 U/L (ref 15–41)
Albumin: 3.2 g/dL — ABNORMAL LOW (ref 3.5–5.0)
Alkaline Phosphatase: 77 U/L (ref 38–126)
Anion gap: 8 (ref 5–15)
BUN: 16 mg/dL (ref 8–23)
CO2: 24 mmol/L (ref 22–32)
Calcium: 8.9 mg/dL (ref 8.9–10.3)
Chloride: 107 mmol/L (ref 98–111)
Creatinine, Ser: 0.85 mg/dL (ref 0.44–1.00)
GFR calc Af Amer: >60 mL/min (ref >60)
GFR calc non Af Amer: >60 mL/min (ref >60)
Glucose, Bld: 113 mg/dL — ABNORMAL HIGH (ref 70–99)
Potassium: 4.2 mmol/L (ref 3.5–5.1)
Sodium: 139 mmol/L (ref 135–145)
Total Bilirubin: 0.2 mg/dL — ABNORMAL LOW (ref 0.3–1.2)
Total Protein: 6.4 g/dL — ABNORMAL LOW (ref 6.5–8.1)

## 2020-04-27 MED ORDER — SODIUM CHLORIDE 0.9% FLUSH
10.0000 mL | Freq: Once | INTRAVENOUS | Status: AC
  Administered 2020-04-27: 10 mL
  Filled 2020-04-27: qty 10

## 2020-04-27 MED ORDER — SODIUM CHLORIDE 0.9% FLUSH
10.0000 mL | INTRAVENOUS | Status: DC | PRN
  Administered 2020-04-27: 10 mL
  Filled 2020-04-27: qty 10

## 2020-04-27 MED ORDER — HEPARIN SOD (PORK) LOCK FLUSH 100 UNIT/ML IV SOLN
500.0000 [IU] | Freq: Once | INTRAVENOUS | Status: AC | PRN
  Administered 2020-04-27: 500 [IU]
  Filled 2020-04-27: qty 5

## 2020-04-27 MED ORDER — PROCHLORPERAZINE MALEATE 10 MG PO TABS
10.0000 mg | ORAL_TABLET | Freq: Once | ORAL | Status: AC
  Administered 2020-04-27: 10 mg via ORAL

## 2020-04-27 MED ORDER — SODIUM CHLORIDE 0.9 % IV SOLN
600.0000 mg/m2 | Freq: Once | INTRAVENOUS | Status: AC
  Administered 2020-04-27: 1026 mg via INTRAVENOUS
  Filled 2020-04-27: qty 26.98

## 2020-04-27 MED ORDER — PROCHLORPERAZINE MALEATE 10 MG PO TABS
ORAL_TABLET | ORAL | Status: AC
  Filled 2020-04-27: qty 1

## 2020-04-27 MED ORDER — SODIUM CHLORIDE 0.9 % IV SOLN
Freq: Once | INTRAVENOUS | Status: AC
  Filled 2020-04-27: qty 250

## 2020-04-27 NOTE — Patient Instructions (Signed)
[facility] Discharge Instructions for Patients Receiving Chemotherapy  Today you received the following chemotherapy agent: Gemcitabine (Gemzar)  To help prevent nausea and vomiting after your treatment, we encourage you to take your nausea medication as directed by your MD.   If you develop nausea and vomiting that is not controlled by your nausea medication, call the clinic.   BELOW ARE SYMPTOMS THAT SHOULD BE REPORTED IMMEDIATELY:  *FEVER GREATER THAN 100.5 F  *CHILLS WITH OR WITHOUT FEVER  NAUSEA AND VOMITING THAT IS NOT CONTROLLED WITH YOUR NAUSEA MEDICATION  *UNUSUAL SHORTNESS OF BREATH  *UNUSUAL BRUISING OR BLEEDING  TENDERNESS IN MOUTH AND THROAT WITH OR WITHOUT PRESENCE OF ULCERS  *URINARY PROBLEMS  *BOWEL PROBLEMS  UNUSUAL RASH Items with * indicate a potential emergency and should be followed up as soon as possible.  Feel free to call the clinic should you have any questions or concerns. The clinic phone number is [phone].  Please show the CHEMO ALERT CARD at check-in to the Emergency Department and triage nurse.   

## 2020-04-28 DIAGNOSIS — C439 Malignant melanoma of skin, unspecified: Secondary | ICD-10-CM | POA: Diagnosis not present

## 2020-04-28 DIAGNOSIS — Z23 Encounter for immunization: Secondary | ICD-10-CM | POA: Diagnosis not present

## 2020-04-28 DIAGNOSIS — R7301 Impaired fasting glucose: Secondary | ICD-10-CM | POA: Diagnosis not present

## 2020-04-28 DIAGNOSIS — Z1339 Encounter for screening examination for other mental health and behavioral disorders: Secondary | ICD-10-CM | POA: Diagnosis not present

## 2020-04-28 DIAGNOSIS — C5700 Malignant neoplasm of unspecified fallopian tube: Secondary | ICD-10-CM | POA: Diagnosis not present

## 2020-04-28 DIAGNOSIS — K469 Unspecified abdominal hernia without obstruction or gangrene: Secondary | ICD-10-CM | POA: Diagnosis not present

## 2020-04-28 DIAGNOSIS — Z0000 Encounter for general adult medical examination without abnormal findings: Secondary | ICD-10-CM | POA: Diagnosis not present

## 2020-04-28 DIAGNOSIS — E785 Hyperlipidemia, unspecified: Secondary | ICD-10-CM | POA: Diagnosis not present

## 2020-04-28 DIAGNOSIS — Z1331 Encounter for screening for depression: Secondary | ICD-10-CM | POA: Diagnosis not present

## 2020-04-28 DIAGNOSIS — R9431 Abnormal electrocardiogram [ECG] [EKG]: Secondary | ICD-10-CM | POA: Diagnosis not present

## 2020-04-28 DIAGNOSIS — M8580 Other specified disorders of bone density and structure, unspecified site: Secondary | ICD-10-CM | POA: Diagnosis not present

## 2020-04-28 DIAGNOSIS — E559 Vitamin D deficiency, unspecified: Secondary | ICD-10-CM | POA: Diagnosis not present

## 2020-05-01 ENCOUNTER — Telehealth: Payer: Self-pay | Admitting: Oncology

## 2020-05-01 NOTE — Telephone Encounter (Signed)
Jessica Johnston called and said she is having some pain in her right side and said it feels like when she has broken ribs in the past.  She is wondering if it is from getting a tetanus shot recently, her hernia or from chemo.  She said the pain is not bad enough that she needs to be seen today.

## 2020-05-04 ENCOUNTER — Telehealth: Payer: Self-pay | Admitting: Oncology

## 2020-05-04 ENCOUNTER — Other Ambulatory Visit: Payer: Self-pay | Admitting: Hematology and Oncology

## 2020-05-04 DIAGNOSIS — C5700 Malignant neoplasm of unspecified fallopian tube: Secondary | ICD-10-CM

## 2020-05-04 NOTE — Telephone Encounter (Signed)
Does she need anything for pain?

## 2020-05-04 NOTE — Telephone Encounter (Signed)
[facility] and asked if she needs anything for pain.  She said she is feeling much better now and doesn't need anything.  Looking back, she said she threw up after treatment on Monday and thinks that is what made her right side/hernia area sore.  Advised her to call back if the pain returns.

## 2020-05-08 NOTE — Telephone Encounter (Signed)
error 

## 2020-05-11 ENCOUNTER — Inpatient Hospital Stay: Payer: Medicare Other

## 2020-05-11 ENCOUNTER — Other Ambulatory Visit: Payer: Self-pay

## 2020-05-11 ENCOUNTER — Encounter: Payer: Self-pay | Admitting: Hematology and Oncology

## 2020-05-11 ENCOUNTER — Inpatient Hospital Stay (HOSPITAL_BASED_OUTPATIENT_CLINIC_OR_DEPARTMENT_OTHER): Payer: Medicare Other | Admitting: Hematology and Oncology

## 2020-05-11 ENCOUNTER — Telehealth: Payer: Self-pay | Admitting: Hematology and Oncology

## 2020-05-11 DIAGNOSIS — C5700 Malignant neoplasm of unspecified fallopian tube: Secondary | ICD-10-CM | POA: Diagnosis not present

## 2020-05-11 DIAGNOSIS — R7301 Impaired fasting glucose: Secondary | ICD-10-CM | POA: Diagnosis not present

## 2020-05-11 DIAGNOSIS — C5702 Malignant neoplasm of left fallopian tube: Secondary | ICD-10-CM

## 2020-05-11 DIAGNOSIS — R110 Nausea: Secondary | ICD-10-CM | POA: Diagnosis not present

## 2020-05-11 DIAGNOSIS — D6481 Anemia due to antineoplastic chemotherapy: Secondary | ICD-10-CM

## 2020-05-11 DIAGNOSIS — Z5111 Encounter for antineoplastic chemotherapy: Secondary | ICD-10-CM | POA: Diagnosis not present

## 2020-05-11 DIAGNOSIS — E785 Hyperlipidemia, unspecified: Secondary | ICD-10-CM | POA: Diagnosis not present

## 2020-05-11 DIAGNOSIS — Z0000 Encounter for general adult medical examination without abnormal findings: Secondary | ICD-10-CM | POA: Diagnosis not present

## 2020-05-11 DIAGNOSIS — T451X5A Adverse effect of antineoplastic and immunosuppressive drugs, initial encounter: Secondary | ICD-10-CM

## 2020-05-11 DIAGNOSIS — Z7189 Other specified counseling: Secondary | ICD-10-CM

## 2020-05-11 DIAGNOSIS — E559 Vitamin D deficiency, unspecified: Secondary | ICD-10-CM | POA: Diagnosis not present

## 2020-05-11 DIAGNOSIS — M8580 Other specified disorders of bone density and structure, unspecified site: Secondary | ICD-10-CM | POA: Diagnosis not present

## 2020-05-11 LAB — CBC WITH DIFFERENTIAL/PLATELET
Abs Immature Granulocytes: 0.02 10*3/uL (ref 0.00–0.07)
Basophils Absolute: 0 10*3/uL (ref 0.0–0.1)
Basophils Relative: 0 %
Eosinophils Absolute: 0.2 10*3/uL (ref 0.0–0.5)
Eosinophils Relative: 4 %
HCT: 33.6 % — ABNORMAL LOW (ref 36.0–46.0)
Hemoglobin: 11 g/dL — ABNORMAL LOW (ref 12.0–15.0)
Immature Granulocytes: 0 %
Lymphocytes Relative: 20 %
Lymphs Abs: 0.9 10*3/uL (ref 0.7–4.0)
MCH: 31.1 pg (ref 26.0–34.0)
MCHC: 32.7 g/dL (ref 30.0–36.0)
MCV: 94.9 fL (ref 80.0–100.0)
Monocytes Absolute: 0.7 10*3/uL (ref 0.1–1.0)
Monocytes Relative: 14 %
Neutro Abs: 3 10*3/uL (ref 1.7–7.7)
Neutrophils Relative %: 62 %
Platelets: 231 10*3/uL (ref 150–400)
RBC: 3.54 MIL/uL — ABNORMAL LOW (ref 3.87–5.11)
RDW: 14.4 % (ref 11.5–15.5)
WBC: 4.8 10*3/uL (ref 4.0–10.5)
nRBC: 0 % (ref 0.0–0.2)

## 2020-05-11 LAB — COMPREHENSIVE METABOLIC PANEL
ALT: 20 U/L (ref 0–44)
AST: 19 U/L (ref 15–41)
Albumin: 3.3 g/dL — ABNORMAL LOW (ref 3.5–5.0)
Alkaline Phosphatase: 75 U/L (ref 38–126)
Anion gap: 6 (ref 5–15)
BUN: 15 mg/dL (ref 8–23)
CO2: 24 mmol/L (ref 22–32)
Calcium: 9.6 mg/dL (ref 8.9–10.3)
Chloride: 106 mmol/L (ref 98–111)
Creatinine, Ser: 0.85 mg/dL (ref 0.44–1.00)
GFR calc Af Amer: >60 mL/min (ref >60)
GFR calc non Af Amer: >60 mL/min (ref >60)
Glucose, Bld: 96 mg/dL (ref 70–99)
Potassium: 4.9 mmol/L (ref 3.5–5.1)
Sodium: 136 mmol/L (ref 135–145)
Total Bilirubin: 0.3 mg/dL (ref 0.3–1.2)
Total Protein: 6.6 g/dL (ref 6.5–8.1)

## 2020-05-11 MED ORDER — SODIUM CHLORIDE 0.9% FLUSH
10.0000 mL | INTRAVENOUS | Status: DC | PRN
  Administered 2020-05-11: 10 mL
  Filled 2020-05-11: qty 10

## 2020-05-11 MED ORDER — HEPARIN SOD (PORK) LOCK FLUSH 100 UNIT/ML IV SOLN
500.0000 [IU] | Freq: Once | INTRAVENOUS | Status: AC | PRN
  Administered 2020-05-11: 500 [IU]
  Filled 2020-05-11: qty 5

## 2020-05-11 MED ORDER — SODIUM CHLORIDE 0.9 % IV SOLN
600.0000 mg/m2 | Freq: Once | INTRAVENOUS | Status: AC
  Administered 2020-05-11: 1026 mg via INTRAVENOUS
  Filled 2020-05-11: qty 26.98

## 2020-05-11 MED ORDER — PROCHLORPERAZINE MALEATE 10 MG PO TABS
10.0000 mg | ORAL_TABLET | Freq: Once | ORAL | Status: AC
  Administered 2020-05-11: 10 mg via ORAL

## 2020-05-11 MED ORDER — SODIUM CHLORIDE 0.9 % IV SOLN
Freq: Once | INTRAVENOUS | Status: AC
  Filled 2020-05-11: qty 250

## 2020-05-11 MED ORDER — PROCHLORPERAZINE MALEATE 10 MG PO TABS
ORAL_TABLET | ORAL | Status: AC
  Filled 2020-05-11: qty 1

## 2020-05-11 NOTE — Progress Notes (Signed)
[place] OFFICE PROGRESS NOTE  Patient Care Team: [name], MD as PCP - General (Internal Medicine)  ASSESSMENT & PLAN:  Fallopian tube cancer, carcinoma ([place]) Overall, she is doing well with almost no side effects except for anemia Her CT imaging from April 30 with the patient which showed positive response to treatment We will continue treatment every other week indefinitely I plan to lengthen the interval between CT imaging Her next CT scan will be at the end of October of this year  Anemia due to antineoplastic chemotherapy This is likely due to recent treatment.  She is not symptomatic We will continue treatment as scheduled  Chemotherapy-induced nausea The cause of her nausea is unknown Observe only for now She has antiemetics to take as needed   No orders of the defined types were placed in this encounter.   All questions were answered. The patient knows to call the clinic with any problems, questions or concerns. The total time spent in the appointment was 20 minutes encounter with patients including review of chart and various tests results, discussions about plan of care and coordination of care plan   [name], MD 05/11/2020 10:16 AM  INTERVAL HISTORY: Please see below for problem oriented charting. 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 Extensive omental nodularity highly worrisome for peritoneal carcinomatosis. Late recurrence of melanoma can present as peritoneal carcinomatosis. Ovarian cancer more commonly presents in this manner. Patient does have a predominately low density right adnexal lesion measuring up to 3.5 cm, although this lesion is not typical for ovarian cancer. 2. No evidence of bowel or ureteral obstruction. 3. No other definite signs of metastatic disease. There are small indeterminate low-density hepatic lesions.   02/08/2016 Tumor Marker   Patient's tumor was tested for the following markers: CA-125 Results of the tumor marker test revealed 656.2   02/15/2016 Procedure   CT-guided core biopsy performed of omental mass just deep to the abdominal wall.   02/15/2016 Pathology Results   Omentum, biopsy, left - PAPILLARY SEROUS NEOPLASM, SEE COMMENT. Microscopic Comment There are papillary collections of largely low grade appearing cells with numerous psammoma bodies. Given the limited material it is difficult to distinguish between invasive implants of a serous borderline tumor and serous carcinoma, especially given the low grade appearance.   02/18/2016 Pathology Results   1. Omentum, resection for tumor INVASIVE IMPLANT OF HIGH GRADE SEROUS CARCINOMA 2. Uterus +/- tubes/ovaries, neoplastic HIGH GRADE SEROUS CARCINOMA INVOLVING LEFT TUBAL FIMBRIA SEROUS CARCINOMA WITH PREDOMINANT PSAMMOMA BODIES IMPLANT AT UTERUS SEROSA, BILATERAL FALLOPIAN TUBAL SEROSA, AND ANTERIOR PERITONEAL REFLECTION CERVIX: HISTOLOGICAL UNREMARKABLE ENDOMETRIUM: INACTIVE ENDOMETRIUM MYOMETRIUM: LEIOMYOMA LEFT OVARY: CYSTADENOFIBROMA RIGHT OVARY AND FALLOPIAN TUBE: HISTOLOGICAL UNREMARKABLE Microscopic Comment 2. ONCOLOGY TABLE - FALLOPIAN TUBE 1. Specimen, including laterality: Omentum, uterus, bilateral ovaries and fallopian tubes 2. Procedure: Hysterectomy, bilateral salpingo-oophorectomy and  tumor debulking  omentectomy 3. Lymph node sampling performed: No 4. Tumor site: uterus serosa, bilateral fallopian tubal serosa, peritoneal and omentum 5. Tumor location in fallopian tube: Left fallopian tubal fimbria 6. Specimen integrity (intact/ruptured/disrupted): Intact 7. Tumor size (cm): multi focal invasive omentum implant greater than 2 cm, left fallopian tube tumor 0.8 cm. 8. Histologic type: Serous carcinoma 9. Grade: 3 10. Microscopic tumor extension: uterus serosa, bilateral fallopian tubal serosa, peritoneal and omentum 11. Margins: NA 12. Lymph-Vascular invasion: identified 13. Lymph nodes: # examined: 0; # positive: NA 14. TNM: pT3c, pNx 15. FIGO Stage (based on pathologic findings, needs clinical correlation: IIIC 16. Comment: High grade serous carcinoma multifocally and extensively involves left fallopian tubal fimbria, omentum, uterus serosa, bilateral fallopian tubal serosa and peritoneal. At the left fallopian tube there are small foci of serous tubal intraepithelial carcinoma identified, so we conclude the carcinoma is fallopian tube origin   03/09/2016 Tumor Marker   Patient's tumor was tested for the following markers: CA-125 Results of the tumor marker test revealed 154.4   03/15/2016 Procedure   Technically successful right IJ power-injectable port catheter placement. Ready for routine use.   03/18/2016 - 07/13/2016 Chemotherapy   The patient had 6 cycles of carboplatin and taxol   04/14/2016 Tumor Marker   Patient's tumor was tested for the following markers: CA-125 Results of the tumor marker test revealed 36.7   04/25/2016 Genetic Testing   Patient has genetic testing done for germline mutation Results revealed patient has no mutation   04/28/2016 Tumor Marker   Patient's tumor was tested for the following markers: CA-125 Results of the tumor marker test revealed 24.6   06/21/2016 Tumor Marker   Patient's tumor was tested for the following markers:  CA-125 Results of the tumor marker test revealed 16.6   08/01/2016 Tumor Marker   Patient's tumor was tested for the following markers: CA-125 Results of the tumor marker test revealed 17.2   08/05/2016 Imaging   Interval TAH-BSO. No evidence of residual pelvic mass or metastatic disease within the abdomen or pelvis. No other acute findings.    11/04/2016 Tumor Marker   Patient's tumor was tested for the following markers: CA-125 Results of the tumor marker test revealed 13.6   01/20/2017 Tumor Marker   Patient's tumor was tested for the following markers: CA-125 Results of the tumor marker test revealed 13.8   04/14/2017 Tumor Marker   Patient's tumor was tested for the following markers: CA-125 Results of the tumor marker test revealed 16.1   06/13/2017 Imaging   No acute findings.  No mass or hernia identified.  Colonic diverticulosis, without radiographic evidence of diverticulitis.  Aortic atherosclerosis.   07/03/2017 Tumor Marker   Patient's tumor was tested for the following markers: CA-125 Results of the tumor marker test revealed 18.4   09/19/2017 Tumor Marker   Patient's tumor was tested for the following markers: CA-125 Results of the tumor marker test revealed 18.5   01/23/2018 Tumor Marker   Patient's tumor was tested for the following markers: CA-125 Results of the tumor marker test revealed 35.4   01/30/2018 Imaging   1. No evidence of local fallopian tube carcinoma recurrence within the pelvis. Post hysterectomy. 2. No evidence of metastatic peritoneal disease or omental disease. No solid organ metastasis.    03/20/2018 Tumor Marker   Patient's tumor was tested for the following markers: CA-125 Results of the tumor marker test revealed 73   05/02/2018 Tumor Marker   Patient's tumor was tested for the following markers:  CA-125 Results of the tumor marker test revealed 127.1   05/08/2018 Imaging   CT abdomen and pelvis 1. 7 cm left subdiaphragmatic  collection of loculated fluid versus low-density soft tissue. Given the patient's history of rising CA 125, metastatic disease considered highly likely.  2. Areas of fluid or recurrent disease identified in the right lower quadrant adjacent to the cecum and along right lower quadrant small bowel loops.   05/14/2018 Tumor Marker   Patient's tumor was tested for the following markers: CA-125 Results of the tumor marker test revealed 166    Genetic Testing   Patient has genetic testing done for ER/PR and MMR. Results revealed patient has the following on 02/18/2016 surgical pathology: ER 20%, PR 0% MMR: normal    Genetic Testing   Patient has genetic testing done for MSI. Results revealed patient has the following: MSI stable   05/18/2018 - 12/31/2018 Chemotherapy   The patient had carboplatin and Doxil   07/16/2018 Tumor Marker   Patient's tumor was tested for the following markers: CA-125 Results of the tumor marker test revealed 28.7   08/10/2018 Imaging   CT imaging:  Decreased size of peritoneal soft tissue masses in left upper quadrant, consistent with decreased metastatic disease.  No new or progressive metastatic disease. No other acute findings.  Colonic diverticulosis, without radiographic evidence of diverticulitis.  Stable small paraumbilical ventral hernia containing transverse colon.   08/13/2018 Tumor Marker   Patient's tumor was tested for the following markers: CA-125 Results of the tumor marker test revealed 21.6   08/24/2018 Echocardiogram   LV EF: 60% -  65%   10/15/2018 Tumor Marker   Patient's tumor was tested for the following markers: CA-125 Results of the tumor marker test revealed 20.1   10/19/2018 Imaging   Ct scan of abdomen and pelvis Status post hysterectomy, bilateral salpingo-oophorectomy, and omentectomy.  Two peritoneal implants in the left upper abdomen are stable versus mildly decreased, as above. No abdominopelvic ascites.  No  evidence of new/progressive metastatic disease.     11/19/2018 Tumor Marker   Patient's tumor was tested for the following markers: CA-125 Results of the tumor marker test revealed 21.7   11/23/2018 Echocardiogram   1. The left ventricle has normal systolic function of 60-65%. The cavity size was normal. There is no increased left ventricular wall thickness. Echo evidence of impaired diastolic relaxation.  2. The right ventricle has normal systolic function. The cavity was normal. There is no increase in right ventricular wall thickness.  3. The mitral valve is normal in structure. There is mild mitral annular calcification present.  4. The tricuspid valve is normal in structure.  5. The aortic valve is tricuspid There is mild thickening of the aortic valve.  6. The pulmonic valve was normal in structure. Pulmonic valve regurgitation is mild by color flow Doppler.  7. Normal LV systolic function; mild diastolic dysfunction.   12/31/2018 Tumor Marker   Patient's tumor was tested for the following markers: CA-125 Results of the tumor marker test revealed 17.1   01/31/2019 Imaging   1. Left upper quadrant peritoneal implants described previously have decreased in the interval. No new or progressive findings in the abdomen/pelvis today. No free fluid. 2. Right paraumbilical ventral hernia contains a small knuckle of transverse colon without complicating features. 3.  Aortic Atherosclerois (ICD10-170.0)  Aortic Atherosclerosis (ICD10-I70.0).   01/31/2019 Tumor Marker   Patient's tumor was tested for the following markers: CA-125 Results of the tumor marker test revealed 18.9  02/11/2019 - 03/13/2019 Chemotherapy   The patient is taking Niraparib. She self discontinued after 1 month   02/25/2019 Tumor Marker   Patient's tumor was tested for the following markers: CA-125 Results of the tumor marker test revealed 18.6   05/13/2019 Tumor Marker   Patient's tumor was tested for the following  markers: CA-125 Results of the tumor marker test revealed 230   05/22/2019 Imaging   1. Increased size of 4.6 cm soft tissue mass in the gastrosplenic ligament, consistent with metastatic disease. 2. No other sites of metastatic disease identified within the abdomen or pelvis. 3. Colonic diverticulosis. No radiographic evidence of diverticulitis. 4. Stable small right paraumbilical hernia containing transverse colon.   Aortic Atherosclerosis (ICD10-I70.0).   05/31/2019 - 08/22/2019 Chemotherapy   The patient had bevacizumab and Taxol for chemotherapy treatment.     05/31/2019 Tumor Marker   Patient's tumor was tested for the following markers: CA-125 Results of the tumor marker test revealed 84   06/21/2019 Tumor Marker   Patient's tumor was tested for the following markers: CA-125 Results of the tumor marker test revealed 49.4.   07/19/2019 Tumor Marker   Patient's tumor was tested for the following markers: CA-125. Results of the tumor marker test revealed 44.5   08/22/2019 Imaging   1. Increase in size of 5.0 cm mass in the gastro splenic ligament consistent with metastatic disease. 2. No additional sites of disease identified within the abdomen or pelvis. 3. Unchanged paraumbilical hernia containing nonobstructed loop of large bowel. 4.  Aortic Atherosclerosis (ICD10-I70.0).     08/30/2019 Tumor Marker   Patient's tumor was tested for the following markers: CA-125 Results of the tumor marker test revealed 71.5   08/30/2019 -  Chemotherapy   The patient had gemzar for chemotherapy treatment.     09/27/2019 Tumor Marker   Patient's tumor was tested for the following markers: CA-125. Results of the tumor marker test revealed 38.5   10/14/2019 Tumor Marker   Patient's tumor was tested for the following markers: CA-125 Results of the tumor marker test revealed 40.8   10/21/2019 Tumor Marker   Patient's tumor was tested for the following markers: CA-125 Results of the tumor  marker test revealed 34.8.   11/22/2019 Imaging   1. Interval decrease in size of left upper quadrant cystic and solid peritoneal lesion. 2. No new sites of disease. 3. Unchanged periumbilical hernia containing a nonobstructed loop of small bowel. 4.  Aortic Atherosclerosis (ICD10-I70.0).     11/25/2019 Tumor Marker   Patient's tumor was tested for the following markers: CA-125 Results of the tumor marker test revealed 22.7   12/23/2019 Tumor Marker   Patient's tumor was tested for the following markers: CA-125 Results of the tumor marker test revealed 26.3   01/20/2020 Tumor Marker   Patient's tumor was tested for the following markers: CA-125 Results of the tumor marker test revealed 28.3   02/14/2020 Imaging   1. 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 2. No new or progressive disease identified within the abdomen or pelvis. 3. Colonic diverticulosis. No radiographic evidence of diverticulitis. 4. Small paraumbilical ventral hernia.     02/17/2020 Tumor Marker   Patient's tumor was tested for the following markers: CA-125 Results of the tumor marker test revealed 40.1     REVIEW OF SYSTEMS:   Constitutional: Denies fevers, chills or abnormal weight loss Eyes: Denies blurriness of vision Ears, nose, mouth, throat, and face: Denies mucositis or  sore throat Respiratory: Denies cough, dyspnea or wheezes Cardiovascular: Denies palpitation, chest discomfort or lower extremity swelling Skin: Denies abnormal skin rashes Lymphatics: Denies new lymphadenopathy or easy bruising Neurological:Denies numbness, tingling or new weaknesses Behavioral/Psych: Mood is stable, no new changes  All other systems were reviewed with the patient and are negative.  I have reviewed the past medical history, past surgical history, social history and family history with the patient and they are unchanged from previous note.  ALLERGIES:  has No Known  Allergies.  MEDICATIONS:  Current Outpatient Medications  Medication Sig Dispense Refill  . Cholecalciferol (VITAMIN D) 2000 units CAPS Take 1 capsule by mouth daily.    Marland Kitchen ezetimibe (ZETIA) 10 MG tablet Take 10 mg by mouth daily.    Marland Kitchen lidocaine-prilocaine (EMLA) cream Apply to affected area once 30 g 3  . morphine (MSIR) 15 MG tablet Take 1 tablet (15 mg total) by mouth every 4 (four) hours as needed for severe pain. 30 tablet 0  . ondansetron (ZOFRAN) 8 MG tablet Take 1 tablet (8 mg total) by mouth 2 (two) times daily as needed (Nausea or vomiting). 30 tablet 1  . prochlorperazine (COMPAZINE) 10 MG tablet Take 1 tablet (10 mg total) by mouth every 6 (six) hours as needed (Nausea or vomiting). 30 tablet 1   No current facility-administered medications for this visit.   Facility-Administered Medications Ordered in Other Visits  Medication Dose Route Frequency Provider Last Rate Last Admin  . gemcitabine (GEMZAR) 1,026 mg in sodium chloride 0.9 % 250 mL chemo infusion  600 mg/m2 (Treatment Plan Recorded) Intravenous Once Alvy Bimler, Mariadelosang Wynns, MD      . heparin lock flush 100 unit/mL  500 Units Intracatheter Once PRN Alvy Bimler, Quinzell Malcomb, MD      . prochlorperazine (COMPAZINE) tablet 10 mg  10 mg Oral Once Jaquise Faux, MD      . sodium chloride flush (NS) 0.9 % injection 10 mL  10 mL Intracatheter PRN Alvy Bimler, Giani Betzold, MD        PHYSICAL EXAMINATION: ECOG PERFORMANCE STATUS: 1 - Symptomatic but completely ambulatory  Vitals:   05/11/20 0927  BP: (!) 113/52  Pulse: 63  Resp: 18  Temp: 97.9 F (36.6 C)  SpO2: 100%   Filed Weights   05/11/20 0927  Weight: 148 lb (67.1 kg)    GENERAL:alert, no distress and comfortable SKIN: skin color, texture, turgor are normal, no rashes or significant lesions EYES: normal, Conjunctiva are pink and non-injected, sclera clear OROPHARYNX:no exudate, no erythema and lips, buccal mucosa, and tongue normal  NECK: supple, thyroid normal size, non-tender, without  nodularity LYMPH:  no palpable lymphadenopathy in the cervical, axillary or inguinal LUNGS: clear to auscultation and percussion with normal breathing effort HEART: regular rate & rhythm and no murmurs and no lower extremity edema ABDOMEN:abdomen soft, non-tender and normal bowel sounds Musculoskeletal:no cyanosis of digits and no clubbing  NEURO: alert & oriented x 3 with fluent speech, no focal motor/sensory deficits  LABORATORY DATA:  I have reviewed the data as listed    Component Value Date/Time   NA 136 05/11/2020 0907   NA 139 06/07/2017 1430   K 4.9 05/11/2020 0907   K 3.8 06/07/2017 1430   CL 106 05/11/2020 0907   CO2 24 05/11/2020 0907   CO2 27 06/07/2017 1430   GLUCOSE 96 05/11/2020 0907   GLUCOSE 124 06/07/2017 1430   BUN 15 05/11/2020 0907   BUN 14.0 06/07/2017 1430   CREATININE 0.85 05/11/2020 0907   CREATININE  0.78 01/20/2020 1029   CREATININE 0.8 06/07/2017 1430   CALCIUM 9.6 05/11/2020 0907   CALCIUM 9.3 06/07/2017 1430   PROT 6.6 05/11/2020 0907   PROT 6.6 09/30/2016 1027   ALBUMIN 3.3 (L) 05/11/2020 0907   ALBUMIN 3.5 09/30/2016 1027   AST 19 05/11/2020 0907   AST 20 01/20/2020 1029   AST 18 09/30/2016 1027   ALT 20 05/11/2020 0907   ALT 28 01/20/2020 1029   ALT 21 09/30/2016 1027   ALKPHOS 75 05/11/2020 0907   ALKPHOS 77 09/30/2016 1027   BILITOT 0.3 05/11/2020 0907   BILITOT 0.2 (L) 01/20/2020 1029   BILITOT 0.37 09/30/2016 1027   GFRNONAA >60 05/11/2020 0907   GFRNONAA >60 01/20/2020 1029   GFRAA >60 05/11/2020 0907   GFRAA >60 01/20/2020 1029    No results found for: SPEP, UPEP  Lab Results  Component Value Date   WBC 4.8 05/11/2020   NEUTROABS 3.0 05/11/2020   HGB 11.0 (L) 05/11/2020   HCT 33.6 (L) 05/11/2020   MCV 94.9 05/11/2020   PLT 231 05/11/2020      Chemistry      Component Value Date/Time   NA 136 05/11/2020 0907   NA 139 06/07/2017 1430   K 4.9 05/11/2020 0907   K 3.8 06/07/2017 1430   CL 106 05/11/2020 0907   CO2  24 05/11/2020 0907   CO2 27 06/07/2017 1430   BUN 15 05/11/2020 0907   BUN 14.0 06/07/2017 1430   CREATININE 0.85 05/11/2020 0907   CREATININE 0.78 01/20/2020 1029   CREATININE 0.8 06/07/2017 1430      Component Value Date/Time   CALCIUM 9.6 05/11/2020 0907   CALCIUM 9.3 06/07/2017 1430   ALKPHOS 75 05/11/2020 0907   ALKPHOS 77 09/30/2016 1027   AST 19 05/11/2020 0907   AST 20 01/20/2020 1029   AST 18 09/30/2016 1027   ALT 20 05/11/2020 0907   ALT 28 01/20/2020 1029   ALT 21 09/30/2016 1027   BILITOT 0.3 05/11/2020 0907   BILITOT 0.2 (L) 01/20/2020 1029   BILITOT 0.37 09/30/2016 1027

## 2020-05-11 NOTE — Assessment & Plan Note (Signed)
This is likely due to recent treatment.  She is not symptomatic We will continue treatment as scheduled

## 2020-05-11 NOTE — Telephone Encounter (Signed)
Scheduled appts per 7/26 sch msg. Gave pt a print out of AVS.

## 2020-05-11 NOTE — Assessment & Plan Note (Signed)
Overall, she is doing well with almost no side effects except for anemia Her CT imaging from April 30 with the patient which showed positive response to treatment We will continue treatment every other week indefinitely I plan to lengthen the interval between CT imaging Her next CT scan will be at the end of October of this year

## 2020-05-11 NOTE — Patient Instructions (Signed)
[facility] Discharge Instructions for Patients Receiving Chemotherapy  Today you received the following chemotherapy agent: Gemcitabine (Gemzar)  To help prevent nausea and vomiting after your treatment, we encourage you to take your nausea medication as directed by your MD.   If you develop nausea and vomiting that is not controlled by your nausea medication, call the clinic.   BELOW ARE SYMPTOMS THAT SHOULD BE REPORTED IMMEDIATELY:  *FEVER GREATER THAN 100.5 F  *CHILLS WITH OR WITHOUT FEVER  NAUSEA AND VOMITING THAT IS NOT CONTROLLED WITH YOUR NAUSEA MEDICATION  *UNUSUAL SHORTNESS OF BREATH  *UNUSUAL BRUISING OR BLEEDING  TENDERNESS IN MOUTH AND THROAT WITH OR WITHOUT PRESENCE OF ULCERS  *URINARY PROBLEMS  *BOWEL PROBLEMS  UNUSUAL RASH Items with * indicate a potential emergency and should be followed up as soon as possible.  Feel free to call the clinic should you have any questions or concerns. The clinic phone number is [phone].  Please show the CHEMO ALERT CARD at check-in to the Emergency Department and triage nurse.   

## 2020-05-11 NOTE — Assessment & Plan Note (Signed)
The cause of her nausea is unknown Observe only for now She has antiemetics to take as needed

## 2020-05-12 ENCOUNTER — Telehealth: Payer: Self-pay

## 2020-05-12 ENCOUNTER — Other Ambulatory Visit: Payer: Self-pay | Admitting: Hematology and Oncology

## 2020-05-12 ENCOUNTER — Encounter

## 2020-05-12 DIAGNOSIS — C786 Secondary malignant neoplasm of retroperitoneum and peritoneum: Secondary | ICD-10-CM

## 2020-05-12 DIAGNOSIS — C569 Malignant neoplasm of unspecified ovary: Secondary | ICD-10-CM

## 2020-05-12 DIAGNOSIS — C5700 Malignant neoplasm of unspecified fallopian tube: Secondary | ICD-10-CM

## 2020-05-12 LAB — CA 125: Cancer Antigen (CA) 125: 113 U/mL — ABNORMAL HIGH (ref 0.0–38.1)

## 2020-05-12 MED ORDER — ERGOCALCIFEROL (VITAMIN D2) 50,000 UNIT CAP
1250 mcg (50,000 unit) | ORAL_CAPSULE | ORAL | 3 refills | Status: DC
Start: 2020-05-12 — End: 2021-04-05

## 2020-05-12 NOTE — Telephone Encounter (Signed)
Called and given all appt details for CT on 8/6 and appt to see Dr. Alvy Bimler on 8/9. She verbalized understanding and will come pick up contrast.

## 2020-05-12 NOTE — Telephone Encounter (Signed)
-----   Message from [name], MD sent at 05/12/2020  8:50 AM EDT ----- Regarding: call pt CA-125 is markedly elevated I recommend moving labs and flush to 8/6, schedule CT on 8/6, see me at 8 am on 8/9 and keep chemo as scheduled Pls help schedule CT and change appt

## 2020-05-12 NOTE — Telephone Encounter (Signed)
Called and left a message for her to call the office back. 

## 2020-05-12 NOTE — Telephone Encounter (Signed)
Called and given below message. She verbalized understanding. Appts scheduled for 8/9 with Dr. Alvy Bimler.

## 2020-05-13 ENCOUNTER — Ambulatory Visit: Admit: 2020-05-13 | Discharge: 2020-05-13 | Payer: MEDICARE | Attending: Family Medicine | Primary: Family Medicine

## 2020-05-13 ENCOUNTER — Ambulatory Visit: Attending: Family Medicine | Primary: Family Medicine

## 2020-05-13 DIAGNOSIS — E739 Lactose intolerance, unspecified: Secondary | ICD-10-CM

## 2020-05-13 NOTE — Progress Notes (Signed)
 Chief Complaint   Patient presents with   . Abdominal Pain     Patient presents in office today with c/o lower abdominal pain.  Also has c/o constipation and diarrhea back and forth.  Not treating with anything.  No other concerns.    1. Have you been to the ER, urgent care clinic since your last visit?  Hospitalized since your last visit?No    2. Have you seen or consulted any other health care providers outside of the [facility] since your last visit?  Include any pap smears or colon screening. No    Learning Assessment 05/06/2013   PRIMARY LEARNER Patient   HIGHEST LEVEL OF EDUCATION - PRIMARY LEARNER  SOME COLLEGE   BARRIERS PRIMARY LEARNER NONE   CO-LEARNER CAREGIVER No   PRIMARY LANGUAGE ENGLISH   INTERPRETER NEED No   LEARNER PREFERENCE PRIMARY READING     DEMONSTRATION     LISTENING   LEARNING SPECIAL TOPICS n/a   ANSWERED BY patient   RELATIONSHIP SELF

## 2020-05-13 NOTE — Progress Notes (Signed)
Progress Note    she is a 79 y.o. year old female who presents for evalution.    Subjective:     Pt states she has been having alternating diarrhea and constipation.  Started about a year ago.  Went to Dr for it thought metformin which was stopped.  However, it continues to go back and forth from constipation to diarrhea.  Concerned she may an infection.  It is now in her lower abdomen.  Has had diverticulitis in the past and this does not feel like it.  Pt is concerned of possible lactose intolerance.  This am had a bowl of cereal with milk and an hour later in bathroom with diarrhea.      Has son with schizoaffective d/o and this causes quite a bit of stress which causes depression.  She is not interested in medication for this.      UTD on mammo, breast Ca in remission.    Reviewed PmHx, RxHx, FmHx, SocHx, AllgHx and updated and dated in the chart.    Review of Systems - negative except as listed above in the HPI    Objective:     Vitals:    05/13/20 1446   BP: 117/76   Pulse: 66   Resp: 16   Temp: 98.2 ??F (36.8 ??C)   TempSrc: Oral   SpO2: 96%   Weight: 161 lb (73 kg)   Height: 5\' 3"  (1.6 m)       Current Outpatient Medications   Medication Sig   ??? ergocalciferol (Vitamin D2) 1,250 mcg (50,000 unit) capsule TAKE 1 CAPSULE EVERY 7 DAYS   ??? atorvastatin (LIPITOR) 40 mg tablet TAKE 1 TABLET DAILY BEFORE BREAKFAST   ??? Benicar HCT 20-12.5 mg per tablet TAKE 1 TABLET DAILY   ??? alendronate (FOSAMAX) 35 mg tablet TAKE 1 TABLET EVERY 7 DAYS   ??? mupirocin (BACTROBAN) 2 % ointment Apply  to affected area four (4) times daily.   ??? Cetirizine (ZYRTEC) 10 mg cap Take  by mouth as needed.   ??? krill-om-3-dha-epa-phospho-ast (MEGARED OMEGA-3 KRILL OIL) 1,000-230-60 mg cap Take 1 Tab by mouth two (2) times a day.   ??? brimonidine-timolol (COMBIGAN) 0.2-0.5 % drop ophthalmic solution Administer 1 Drop to both eyes every twelve (12) hours.   ??? aspirin 81 mg chewable tablet Take 81 mg by mouth daily.   ??? metFORMIN ER  (GLUCOPHAGE XR) 500 mg tablet Take 1 Tab by mouth daily (with dinner). (Patient not taking: Reported on 05/13/2020)   ??? anastrozole (ARIMIDEX) 1 mg tablet Take 1 mg by mouth daily. (Patient not taking: Reported on 05/13/2020)     No current facility-administered medications for this visit.       Physical Examination: General appearance - alert, well appearing, and in no distress  Chest - clear to auscultation, no wheezes, rales or rhonchi, symmetric air entry  Heart - normal rate, regular rhythm, normal S1, S2, no murmurs, rubs, clicks or gallops    Abd- soft NT ND  Assessment/ Plan:   Diagnoses and all orders for this visit:    1. Lactose intolerance  She will stop all dairy.  If problem is not resolving she will follow back up  2. Mild episode of recurrent major depressive disorder ([place])  managing on her own not interested in any medication for this.  3. Malignant neoplasm of upper-inner quadrant of right breast in female, estrogen receptor positive ([place])  In remission UTD on screening.     Follow-up and  Dispositions    ?? Return if symptoms worsen or fail to improve.           I have discussed the diagnosis with the patient and the intended plan as seen in the above orders.  The patient has received an after-visit summary and questions were answered concerning future plans. Pt conveyed understanding of plan.    Medication Side Effects and Warnings were discussed with patient    An electronic signature was used to authenticate this note  Esau Grew, DO

## 2020-05-14 NOTE — Progress Notes (Signed)
[place] report was put on Dr Laverta [place] desk

## 2020-05-22 ENCOUNTER — Other Ambulatory Visit: Payer: Self-pay

## 2020-05-22 ENCOUNTER — Inpatient Hospital Stay: Payer: Medicare Other | Attending: Gynecologic Oncology

## 2020-05-22 ENCOUNTER — Ambulatory Visit (HOSPITAL_COMMUNITY)
Admission: RE | Admit: 2020-05-22 | Discharge: 2020-05-22 | Disposition: A | Discharge status: Home / Self Care | Payer: Medicare Other | Source: Ambulatory Visit | Attending: Hematology and Oncology | Admitting: Hematology and Oncology

## 2020-05-22 ENCOUNTER — Inpatient Hospital Stay: Payer: Medicare Other

## 2020-05-22 DIAGNOSIS — C5702 Malignant neoplasm of left fallopian tube: Secondary | ICD-10-CM | POA: Diagnosis not present

## 2020-05-22 DIAGNOSIS — Z5111 Encounter for antineoplastic chemotherapy: Secondary | ICD-10-CM | POA: Diagnosis not present

## 2020-05-22 DIAGNOSIS — C762 Malignant neoplasm of abdomen: Secondary | ICD-10-CM | POA: Insufficient documentation

## 2020-05-22 DIAGNOSIS — C786 Secondary malignant neoplasm of retroperitoneum and peritoneum: Secondary | ICD-10-CM

## 2020-05-22 DIAGNOSIS — C569 Malignant neoplasm of unspecified ovary: Secondary | ICD-10-CM | POA: Diagnosis not present

## 2020-05-22 DIAGNOSIS — C5700 Malignant neoplasm of unspecified fallopian tube: Secondary | ICD-10-CM

## 2020-05-22 DIAGNOSIS — Z95828 Presence of other vascular implants and grafts: Secondary | ICD-10-CM

## 2020-05-22 DIAGNOSIS — Z23 Encounter for immunization: Secondary | ICD-10-CM | POA: Insufficient documentation

## 2020-05-22 DIAGNOSIS — C787 Secondary malignant neoplasm of liver and intrahepatic bile duct: Secondary | ICD-10-CM | POA: Diagnosis not present

## 2020-05-22 LAB — CBC WITH DIFFERENTIAL/PLATELET
Abs Immature Granulocytes: 0.04 10*3/uL (ref 0.00–0.07)
Basophils Absolute: 0 10*3/uL (ref 0.0–0.1)
Basophils Relative: 0 %
Eosinophils Absolute: 0.1 10*3/uL (ref 0.0–0.5)
Eosinophils Relative: 2 %
HCT: 33.9 % — ABNORMAL LOW (ref 36.0–46.0)
Hemoglobin: 11.1 g/dL — ABNORMAL LOW (ref 12.0–15.0)
Immature Granulocytes: 1 %
Lymphocytes Relative: 16 %
Lymphs Abs: 0.9 10*3/uL (ref 0.7–4.0)
MCH: 30.4 pg (ref 26.0–34.0)
MCHC: 32.7 g/dL (ref 30.0–36.0)
MCV: 92.9 fL (ref 80.0–100.0)
Monocytes Absolute: 1 10*3/uL (ref 0.1–1.0)
Monocytes Relative: 18 %
Neutro Abs: 3.5 10*3/uL (ref 1.7–7.7)
Neutrophils Relative %: 63 %
Platelets: 208 10*3/uL (ref 150–400)
RBC: 3.65 MIL/uL — ABNORMAL LOW (ref 3.87–5.11)
RDW: 14.5 % (ref 11.5–15.5)
WBC: 5.6 10*3/uL (ref 4.0–10.5)
nRBC: 0 % (ref 0.0–0.2)

## 2020-05-22 LAB — COMPREHENSIVE METABOLIC PANEL
ALT: 26 U/L (ref 0–44)
AST: 24 U/L (ref 15–41)
Albumin: 3.5 g/dL (ref 3.5–5.0)
Alkaline Phosphatase: 82 U/L (ref 38–126)
Anion gap: 8 (ref 5–15)
BUN: 16 mg/dL (ref 8–23)
CO2: 24 mmol/L (ref 22–32)
Calcium: 9.6 mg/dL (ref 8.9–10.3)
Chloride: 106 mmol/L (ref 98–111)
Creatinine, Ser: 0.8 mg/dL (ref 0.44–1.00)
GFR calc Af Amer: >60 mL/min (ref >60)
GFR calc non Af Amer: >60 mL/min (ref >60)
Glucose, Bld: 87 mg/dL (ref 70–99)
Potassium: 4.6 mmol/L (ref 3.5–5.1)
Sodium: 138 mmol/L (ref 135–145)
Total Bilirubin: 0.3 mg/dL (ref 0.3–1.2)
Total Protein: 6.8 g/dL (ref 6.5–8.1)

## 2020-05-22 MED ORDER — SODIUM CHLORIDE 0.9% FLUSH
10.0000 mL | Freq: Once | INTRAVENOUS | Status: AC
  Administered 2020-05-22: 10 mL
  Filled 2020-05-22: qty 10

## 2020-05-22 MED ORDER — HEPARIN SOD (PORK) LOCK FLUSH 100 UNIT/ML IV SOLN: 500.0000 [IU] | Freq: Once | INTRAVENOUS | Status: AC

## 2020-05-22 MED ORDER — IOHEXOL 300 MG/ML  SOLN
100.0000 mL | Freq: Once | INTRAMUSCULAR | Status: AC | PRN
  Administered 2020-05-22: 100 mL via INTRAVENOUS

## 2020-05-22 MED ORDER — SODIUM CHLORIDE (PF) 0.9 % IJ SOLN
INTRAMUSCULAR | Status: AC
  Filled 2020-05-22: qty 50

## 2020-05-22 MED ORDER — HEPARIN SOD (PORK) LOCK FLUSH 100 UNIT/ML IV SOLN
INTRAVENOUS | Status: AC
  Administered 2020-05-22: 500 [IU] via INTRAVENOUS
  Filled 2020-05-22: qty 5

## 2020-05-22 NOTE — Patient Instructions (Signed)

## 2020-05-23 LAB — CA 125: Cancer Antigen (CA) 125: 118 U/mL — ABNORMAL HIGH (ref 0.0–38.1)

## 2020-05-25 ENCOUNTER — Other Ambulatory Visit: Payer: Self-pay

## 2020-05-25 ENCOUNTER — Encounter: Payer: Self-pay | Admitting: Hematology and Oncology

## 2020-05-25 ENCOUNTER — Inpatient Hospital Stay: Payer: Medicare Other

## 2020-05-25 ENCOUNTER — Other Ambulatory Visit: Payer: Medicare Other

## 2020-05-25 ENCOUNTER — Telehealth: Payer: Self-pay

## 2020-05-25 ENCOUNTER — Inpatient Hospital Stay (HOSPITAL_BASED_OUTPATIENT_CLINIC_OR_DEPARTMENT_OTHER): Payer: Medicare Other | Admitting: Hematology and Oncology

## 2020-05-25 VITALS — BP 151/65 | HR 71 | Temp 97.9°F | Resp 18 | Ht 64.0 in | Wt 148.8 lb

## 2020-05-25 DIAGNOSIS — Z7189 Other specified counseling: Secondary | ICD-10-CM | POA: Diagnosis not present

## 2020-05-25 DIAGNOSIS — C5700 Malignant neoplasm of unspecified fallopian tube: Secondary | ICD-10-CM

## 2020-05-25 DIAGNOSIS — C762 Malignant neoplasm of abdomen: Secondary | ICD-10-CM

## 2020-05-25 DIAGNOSIS — Z5111 Encounter for antineoplastic chemotherapy: Secondary | ICD-10-CM | POA: Diagnosis not present

## 2020-05-25 DIAGNOSIS — C5702 Malignant neoplasm of left fallopian tube: Secondary | ICD-10-CM | POA: Diagnosis not present

## 2020-05-25 DIAGNOSIS — Z23 Encounter for immunization: Secondary | ICD-10-CM | POA: Diagnosis not present

## 2020-05-25 NOTE — Assessment & Plan Note (Signed)
She had recent vague abdominal pain, nausea and vomiting She is not symptomatic She denies need for pain medicine She will take antiemetics as needed

## 2020-05-25 NOTE — Progress Notes (Signed)
DISCONTINUE ON PATHWAY REGIMEN - Ovarian     A cycle is every 21 days:     Gemcitabine   **Always confirm dose/schedule in your pharmacy ordering system**  REASON: Disease Progression PRIOR TREATMENT: [id number]: Gemcitabine 1,000 mg/m2 D1, 8 q21 Days; Re-evaluate Every 3 Cycles, Treat until Complete Response, Unacceptable Toxicity, or Disease Progression TREATMENT RESPONSE: Progressive Disease (PD)  START OFF PATHWAY REGIMEN - Ovarian   [id number]:Liposomal Doxorubicin (Doxil) 30 mg/m2 + Carboplatin AUC=5 q28 Days:   A cycle is every 28 days:     Carboplatin      Liposomal doxorubicin   **Always confirm dose/schedule in your pharmacy ordering system**  Patient Characteristics: Recurrent or Progressive Disease, [place] and [place], BRCA Mutation Absent Therapeutic Status: Recurrent or Progressive Disease BRCA Mutation Status: Absent Line of Therapy: [place]  Intent of Therapy: Non-Curative / Palliative Intent, Discussed with Patient

## 2020-05-25 NOTE — Progress Notes (Signed)
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, CT imaging show disease progression She had one episode of nausea vomiting several weeks ago but has not have any further nausea since She denies significant abdominal symptoms She has some vague abdominal pain We reviewed the current guidelines Based on her previous treatment record, in 2019, she have obtained maximum response to combination carboplatin and Doxil but did not progress on those regimen We discussed the risks, benefits, side effects of combination carboplatin with Doxil versus single agent carboplatin or single agent Doxil Ultimately, the patient has made informed decision to pursue combination treatment She is aware of side effects of allergic reaction, congestive heart failure, pancytopenia, infection and others We will order pretreatment echocardiogram I will cancel her current regimen and reschedule her treatment to start next week I recommend minimum 3 cycles of treatment before repeating imaging study  Goals of care, counseling/discussion We discussed goals of care The plan is to continue treatment indefinitely She is aware that treatment goal is palliative in nature  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 CBC with Differential (Cancer Center Only)    Standing Status:   Standing    Number of Occurrences:   20    Standing Expiration Date:   05/25/2021  . CMP ([facility] only)    Standing Status:   Standing    Number of Occurrences:   20    Standing Expiration Date:   05/25/2021  . ECHOCARDIOGRAM COMPLETE    Standing Status:   Future    Standing Expiration Date:   05/25/2021     Order Specific Question:   Where should this test be performed    Answer:   [place]    Order Specific Question:   Perflutren DEFINITY (image enhancing agent) should be administered unless hypersensitivity or allergy exist    Answer:   Administer Perflutren    Order Specific Question:   Reason for exam-Echo    Answer:   Chemotherapy evaluation  v87.41 / v58.11    All questions were answered. The patient knows to call the clinic with any problems, questions or concerns. The total time spent in the appointment was 40 minutes encounter with patients including review of chart and various tests results, discussions about plan of care and coordination of care plan   [name], MD 05/25/2020 8:44 AM  INTERVAL HISTORY: Please see below for problem oriented charting. 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 Extensive omental nodularity highly worrisome for peritoneal carcinomatosis. Late recurrence of melanoma  can present as peritoneal carcinomatosis. Ovarian cancer more commonly presents in this manner. Patient does have a predominately low density right adnexal lesion measuring up to 3.5 cm, although this lesion is not typical for ovarian cancer. 2. No evidence of bowel or ureteral obstruction. 3. No other definite  signs of metastatic disease. There are small indeterminate low-density hepatic lesions.   02/08/2016 Tumor Marker   Patient's tumor was tested for the following markers: CA-125 Results of the tumor marker test revealed 656.2   02/15/2016 Procedure   CT-guided core biopsy performed of omental mass just deep to the abdominal wall.   02/15/2016 Pathology Results   Omentum, biopsy, left - PAPILLARY SEROUS NEOPLASM, SEE COMMENT. Microscopic Comment There are papillary collections of largely low grade appearing cells with numerous psammoma bodies. Given the limited material it is difficult to distinguish between invasive implants of a serous borderline tumor and serous carcinoma, especially given the low grade appearance.   02/18/2016 Pathology Results   1. Omentum, resection for tumor INVASIVE IMPLANT OF HIGH GRADE SEROUS CARCINOMA 2. Uterus +/- tubes/ovaries, neoplastic HIGH GRADE SEROUS CARCINOMA INVOLVING LEFT TUBAL FIMBRIA SEROUS CARCINOMA WITH PREDOMINANT PSAMMOMA BODIES IMPLANT AT UTERUS SEROSA, BILATERAL FALLOPIAN TUBAL SEROSA, AND ANTERIOR PERITONEAL REFLECTION CERVIX: HISTOLOGICAL UNREMARKABLE ENDOMETRIUM: INACTIVE ENDOMETRIUM MYOMETRIUM: LEIOMYOMA LEFT OVARY: CYSTADENOFIBROMA RIGHT OVARY AND FALLOPIAN TUBE: HISTOLOGICAL UNREMARKABLE Microscopic Comment 2. ONCOLOGY TABLE - FALLOPIAN TUBE 1. Specimen, including laterality: Omentum, uterus, bilateral ovaries and fallopian tubes 2. Procedure: Hysterectomy, bilateral salpingo-oophorectomy and tumor debulking omentectomy 3. Lymph node sampling performed: No 4. Tumor site: uterus serosa, bilateral fallopian tubal serosa, peritoneal and omentum 5. Tumor location in fallopian tube: Left fallopian tubal fimbria 6. Specimen integrity (intact/ruptured/disrupted): Intact 7. Tumor size (cm): multi focal invasive omentum implant greater than 2 cm, left fallopian tube tumor 0.8 cm. 8. Histologic type: Serous carcinoma 9. Grade: 3 10. Microscopic  tumor extension: uterus serosa, bilateral fallopian tubal serosa, peritoneal and omentum 11. Margins: NA 12. Lymph-Vascular invasion: identified 13. Lymph nodes: # examined: 0; # positive: NA 14. TNM: pT3c, pNx 15. FIGO Stage (based on pathologic findings, needs clinical correlation: IIIC 16. Comment: High grade serous carcinoma multifocally and extensively involves left fallopian tubal fimbria, omentum, uterus serosa, bilateral fallopian tubal serosa and peritoneal. At the left fallopian tube there are small foci of serous tubal intraepithelial carcinoma identified, so we conclude the carcinoma is fallopian tube origin   03/09/2016 Tumor Marker   Patient's tumor was tested for the following markers: CA-125 Results of the tumor marker test revealed 154.4   03/15/2016 Procedure   Technically successful right IJ power-injectable port catheter placement. Ready for routine use.   03/18/2016 - 07/13/2016 Chemotherapy   The patient had 6 cycles of carboplatin and taxol   04/14/2016 Tumor Marker   Patient's tumor was tested for the following markers: CA-125 Results of the tumor marker test revealed 36.7   04/25/2016 Genetic Testing   Patient has genetic testing done for germline mutation Results revealed patient has no mutation   04/28/2016 Tumor Marker   Patient's tumor was tested for the following markers: CA-125 Results of the tumor marker test revealed 24.6   06/21/2016 Tumor Marker   Patient's tumor was tested for the following markers: CA-125 Results of the tumor marker test revealed 16.6   08/01/2016 Tumor Marker   Patient's tumor was tested for the following markers: CA-125 Results of the tumor marker test revealed 17.2   08/05/2016 Imaging   Interval TAH-BSO. No evidence of residual pelvic mass  or metastatic disease within the abdomen or pelvis. No other acute findings.    11/04/2016 Tumor Marker   Patient's tumor was tested for the following markers: CA-125 Results of the tumor  marker test revealed 13.6   01/20/2017 Tumor Marker   Patient's tumor was tested for the following markers: CA-125 Results of the tumor marker test revealed 13.8   04/14/2017 Tumor Marker   Patient's tumor was tested for the following markers: CA-125 Results of the tumor marker test revealed 16.1   06/13/2017 Imaging   No acute findings.  No mass or hernia identified.  Colonic diverticulosis, without radiographic evidence of diverticulitis.  Aortic atherosclerosis.   07/03/2017 Tumor Marker   Patient's tumor was tested for the following markers: CA-125 Results of the tumor marker test revealed 18.4   09/19/2017 Tumor Marker   Patient's tumor was tested for the following markers: CA-125 Results of the tumor marker test revealed 18.5   01/23/2018 Tumor Marker   Patient's tumor was tested for the following markers: CA-125 Results of the tumor marker test revealed 35.4   01/30/2018 Imaging   1. No evidence of local fallopian tube carcinoma recurrence within the pelvis. Post hysterectomy. 2. No evidence of metastatic peritoneal disease or omental disease. No solid organ metastasis.    03/20/2018 Tumor Marker   Patient's tumor was tested for the following markers: CA-125 Results of the tumor marker test revealed 73   05/02/2018 Tumor Marker   Patient's tumor was tested for the following markers: CA-125 Results of the tumor marker test revealed 127.1   05/08/2018 Imaging   CT abdomen and pelvis 1. 7 cm left subdiaphragmatic collection of loculated fluid versus low-density soft tissue. Given the patient's history of rising CA 125, metastatic disease considered highly likely.  2. Areas of fluid or recurrent disease identified in the right lower quadrant adjacent to the cecum and along right lower quadrant small bowel loops.   05/14/2018 Tumor Marker   Patient's tumor was tested for the following markers: CA-125 Results of the tumor marker test revealed 166    Genetic Testing    Patient has genetic testing done for ER/PR and MMR. Results revealed patient has the following on 02/18/2016 surgical pathology: ER 20%, PR 0% MMR: normal    Genetic Testing   Patient has genetic testing done for MSI. Results revealed patient has the following: MSI stable   05/18/2018 - 12/31/2018 Chemotherapy   The patient had carboplatin and Doxil   07/16/2018 Tumor Marker   Patient's tumor was tested for the following markers: CA-125 Results of the tumor marker test revealed 28.7   08/10/2018 Imaging   CT imaging:  Decreased size of peritoneal soft tissue masses in left upper quadrant, consistent with decreased metastatic disease.  No new or progressive metastatic disease. No other acute findings.  Colonic diverticulosis, without radiographic evidence of diverticulitis.  Stable small paraumbilical ventral hernia containing transverse colon.   08/13/2018 Tumor Marker   Patient's tumor was tested for the following markers: CA-125 Results of the tumor marker test revealed 21.6   08/24/2018 Echocardiogram   LV EF: 60% -  65%   10/15/2018 Tumor Marker   Patient's tumor was tested for the following markers: CA-125 Results of the tumor marker test revealed 20.1   10/19/2018 Imaging   Ct scan of abdomen and pelvis Status post hysterectomy, bilateral salpingo-oophorectomy, and omentectomy.  Two peritoneal implants in the left upper abdomen are stable versus mildly decreased, as above. No abdominopelvic ascites.  No evidence  of new/progressive metastatic disease.     11/19/2018 Tumor Marker   Patient's tumor was tested for the following markers: CA-125 Results of the tumor marker test revealed 21.7   11/23/2018 Echocardiogram   1. The left ventricle has normal systolic function of 85-63%. The cavity size was normal. There is no increased left ventricular wall thickness. Echo evidence of impaired diastolic relaxation.  2. The right ventricle has normal systolic function. The  cavity was normal. There is no increase in right ventricular wall thickness.  3. The mitral valve is normal in structure. There is mild mitral annular calcification present.  4. The tricuspid valve is normal in structure.  5. The aortic valve is tricuspid There is mild thickening of the aortic valve.  6. The pulmonic valve was normal in structure. Pulmonic valve regurgitation is mild by color flow Doppler.  7. Normal LV systolic function; mild diastolic dysfunction.   12/31/2018 Tumor Marker   Patient's tumor was tested for the following markers: CA-125 Results of the tumor marker test revealed 17.1   01/31/2019 Imaging   1. Left upper quadrant peritoneal implants described previously have decreased in the interval. No new or progressive findings in the abdomen/pelvis today. No free fluid. 2. Right paraumbilical ventral hernia contains a small knuckle of transverse colon without complicating features. 3.  Aortic Atherosclerois (ICD10-170.0)  Aortic Atherosclerosis (ICD10-I70.0).   01/31/2019 Tumor Marker   Patient's tumor was tested for the following markers: CA-125 Results of the tumor marker test revealed 18.9   02/11/2019 - 03/13/2019 Chemotherapy   The patient is taking Niraparib. She self discontinued after 1 month   02/25/2019 Tumor Marker   Patient's tumor was tested for the following markers: CA-125 Results of the tumor marker test revealed 18.6   05/13/2019 Tumor Marker   Patient's tumor was tested for the following markers: CA-125 Results of the tumor marker test revealed 230   05/22/2019 Imaging   1. Increased size of 4.6 cm soft tissue mass in the gastrosplenic ligament, consistent with metastatic disease. 2. No other sites of metastatic disease identified within the abdomen or pelvis. 3. Colonic diverticulosis. No radiographic evidence of diverticulitis. 4. Stable small right paraumbilical hernia containing transverse colon.   Aortic Atherosclerosis (ICD10-I70.0).    05/31/2019 - 08/22/2019 Chemotherapy   The patient had bevacizumab and Taxol for chemotherapy treatment.     05/31/2019 Tumor Marker   Patient's tumor was tested for the following markers: CA-125 Results of the tumor marker test revealed 84   06/21/2019 Tumor Marker   Patient's tumor was tested for the following markers: CA-125 Results of the tumor marker test revealed 49.4.   07/19/2019 Tumor Marker   Patient's tumor was tested for the following markers: CA-125. Results of the tumor marker test revealed 44.5   08/22/2019 Imaging   1. Increase in size of 5.0 cm mass in the gastro splenic ligament consistent with metastatic disease. 2. No additional sites of disease identified within the abdomen or pelvis. 3. Unchanged paraumbilical hernia containing nonobstructed loop of large bowel. 4.  Aortic Atherosclerosis (ICD10-I70.0).     08/30/2019 Tumor Marker   Patient's tumor was tested for the following markers: CA-125 Results of the tumor marker test revealed 71.5   08/30/2019 - 05/11/2020 Chemotherapy   The patient had gemzar for chemotherapy treatment.     09/27/2019 Tumor Marker   Patient's tumor was tested for the following markers: CA-125. Results of the tumor marker test revealed 38.5   10/14/2019 Tumor Marker   Patient's  tumor was tested for the following markers: CA-125 Results of the tumor marker test revealed 40.8   10/21/2019 Tumor Marker   Patient's tumor was tested for the following markers: CA-125 Results of the tumor marker test revealed 34.8.   11/22/2019 Imaging   1. Interval decrease in size of left upper quadrant cystic and solid peritoneal lesion. 2. No new sites of disease. 3. Unchanged periumbilical hernia containing a nonobstructed loop of small bowel. 4.  Aortic Atherosclerosis (ICD10-I70.0).     11/25/2019 Tumor Marker   Patient's tumor was tested for the following markers: CA-125 Results of the tumor marker test revealed 22.7   12/23/2019 Tumor Marker    Patient's tumor was tested for the following markers: CA-125 Results of the tumor marker test revealed 26.3   01/20/2020 Tumor Marker   Patient's tumor was tested for the following markers: CA-125 Results of the tumor marker test revealed 28.3   02/14/2020 Imaging   1. 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 2. No new or progressive disease identified within the abdomen or pelvis. 3. Colonic diverticulosis. No radiographic evidence of diverticulitis. 4. Small paraumbilical ventral hernia.     02/17/2020 Tumor Marker   Patient's tumor was tested for the following markers: CA-125 Results of the tumor marker test revealed 40.1   05/11/2020 Tumor Marker   Patient's tumor was tested for the following markers: CA-125 Results of the tumor marker test revealed 113.   05/22/2020 Imaging   1. 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 2. No evidence of ascites, bowel or ureteral obstruction. 3. 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 No evidence of incarceration or obstruction. 4. Aortic Atherosclerosis (ICD10-I70.0).   05/22/2020 Tumor Marker   Patient's tumor was tested for the following markers: CA-125 Results of the tumor marker test revealed 118   06/01/2020 -  Chemotherapy   The patient had palonosetron (ALOXI) injection 0.25 mg, 0.25 mg, Intravenous,  Once, 0 of 6 cycles CARBOplatin (PARAPLATIN) 370 mg in sodium chloride 0.9 % 100 mL chemo infusion, 370 mg (100 % of original dose 372 mg), Intravenous,  Once, 0 of 6 cycles Dose modification:   (original dose 372 mg, Cycle 1) DOXOrubicin HCL LIPOSOMAL (DOXIL) 52 mg in dextrose 5 % 250 mL chemo infusion, 30 mg/m2 = 52 mg, Intravenous,  Once, 0 of 6 cycles fosaprepitant (EMEND) 150 mg in sodium chloride 0.9 % 145 mL IVPB, 150 mg, Intravenous,  Once,  0 of 6 cycles  for chemotherapy treatment.      REVIEW OF SYSTEMS:   Constitutional: Denies fevers, chills or abnormal weight loss Eyes: Denies blurriness of vision Ears, nose, mouth, throat, and face: Denies mucositis or sore throat Respiratory: Denies cough, dyspnea or wheezes Cardiovascular: Denies palpitation, chest discomfort or lower extremity swelling Skin: Denies abnormal skin rashes Lymphatics: Denies new lymphadenopathy or easy bruising Neurological:Denies numbness, tingling or new weaknesses Behavioral/Psych: Mood is stable, no new changes  All other systems were reviewed with the patient and are negative.  I have reviewed the past medical history, past surgical history, social history and family history with the patient and they are unchanged from previous note.  ALLERGIES:  has No Known Allergies.  MEDICATIONS:  Current Outpatient Medications  Medication Sig Dispense Refill  . Cholecalciferol (VITAMIN D) 2000 units CAPS Take 1 capsule by mouth daily.    Marland Kitchen  ezetimibe (ZETIA) 10 MG tablet Take 10 mg by mouth daily.    Marland Kitchen lidocaine-prilocaine (EMLA) cream Apply to affected area once 30 g 3  . morphine (MSIR) 15 MG tablet Take 1 tablet (15 mg total) by mouth every 4 (four) hours as needed for severe pain. 30 tablet 0  . ondansetron (ZOFRAN) 8 MG tablet Take 1 tablet (8 mg total) by mouth 2 (two) times daily as needed (Nausea or vomiting). 30 tablet 1  . prochlorperazine (COMPAZINE) 10 MG tablet Take 1 tablet (10 mg total) by mouth every 6 (six) hours as needed (Nausea or vomiting). 30 tablet 1   No current facility-administered medications for this visit.    PHYSICAL EXAMINATION: ECOG PERFORMANCE STATUS: 1 - Symptomatic but completely ambulatory  Vitals:   05/25/20 0818  BP: (!) 151/65  Pulse: 71  Resp: 18  Temp: 97.9 F (36.6 C)  SpO2: 100%   Filed Weights   05/25/20 0818  Weight: 148 lb 12.8 oz (67.5 kg)    GENERAL:alert, no distress and comfortable NEURO:  alert & oriented x 3 with fluent speech, no focal motor/sensory deficits  LABORATORY DATA:  I have reviewed the data as listed    Component Value Date/Time   NA 138 05/22/2020 1001   NA 139 06/07/2017 1430   K 4.6 05/22/2020 1001   K 3.8 06/07/2017 1430   CL 106 05/22/2020 1001   CO2 24 05/22/2020 1001   CO2 27 06/07/2017 1430   GLUCOSE 87 05/22/2020 1001   GLUCOSE 124 06/07/2017 1430   BUN 16 05/22/2020 1001   BUN 14.0 06/07/2017 1430   CREATININE 0.80 05/22/2020 1001   CREATININE 0.78 01/20/2020 1029   CREATININE 0.8 06/07/2017 1430   CALCIUM 9.6 05/22/2020 1001   CALCIUM 9.3 06/07/2017 1430   PROT 6.8 05/22/2020 1001   PROT 6.6 09/30/2016 1027   ALBUMIN 3.5 05/22/2020 1001   ALBUMIN 3.5 09/30/2016 1027   AST 24 05/22/2020 1001   AST 20 01/20/2020 1029   AST 18 09/30/2016 1027   ALT 26 05/22/2020 1001   ALT 28 01/20/2020 1029   ALT 21 09/30/2016 1027   ALKPHOS 82 05/22/2020 1001   ALKPHOS 77 09/30/2016 1027   BILITOT 0.3 05/22/2020 1001   BILITOT 0.2 (L) 01/20/2020 1029   BILITOT 0.37 09/30/2016 1027   GFRNONAA >60 05/22/2020 1001   GFRNONAA >60 01/20/2020 1029   GFRAA >60 05/22/2020 1001   GFRAA >60 01/20/2020 1029    No results found for: SPEP, UPEP  Lab Results  Component Value Date   WBC 5.6 05/22/2020   NEUTROABS 3.5 05/22/2020   HGB 11.1 (L) 05/22/2020   HCT 33.9 (L) 05/22/2020   MCV 92.9 05/22/2020   PLT 208 05/22/2020      Chemistry      Component Value Date/Time   NA 138 05/22/2020 1001   NA 139 06/07/2017 1430   K 4.6 05/22/2020 1001   K 3.8 06/07/2017 1430   CL 106 05/22/2020 1001   CO2 24 05/22/2020 1001   CO2 27 06/07/2017 1430   BUN 16 05/22/2020 1001   BUN 14.0 06/07/2017 1430   CREATININE 0.80 05/22/2020 1001   CREATININE 0.78 01/20/2020 1029   CREATININE 0.8 06/07/2017 1430      Component Value Date/Time   CALCIUM 9.6 05/22/2020 1001   CALCIUM 9.3 06/07/2017 1430   ALKPHOS 82 05/22/2020 1001   ALKPHOS 77 09/30/2016 1027    AST 24 05/22/2020 1001   AST 20 01/20/2020 1029  AST 18 09/30/2016 1027   ALT 26 05/22/2020 1001   ALT 28 01/20/2020 1029   ALT 21 09/30/2016 1027   BILITOT 0.3 05/22/2020 1001   BILITOT 0.2 (L) 01/20/2020 1029   BILITOT 0.37 09/30/2016 1027       RADIOGRAPHIC STUDIES: I have reviewed multiple imaging studies with the patient I have personally reviewed the radiological images as listed and agreed with the findings in the report. CT ABDOMEN PELVIS W CONTRAST  Result Date: 05/22/2020 CLINICAL DATA:  Fallopian tube/ovarian cancer. Assess treatment response. Previous hysterectomy. EXAM: CT ABDOMEN AND PELVIS WITH CONTRAST TECHNIQUE: Multidetector CT imaging of the abdomen and pelvis was performed using the standard protocol following bolus administration of intravenous contrast. CONTRAST:  166m OMNIPAQUE IOHEXOL 300 MG/ML  SOLN COMPARISON:  Abdominopelvic CT 02/14/2020 and 11/22/2019. FINDINGS: Lower chest: Stable subpleural reticulation at both lung bases, likely a combination of scarring and atelectasis. No suspicious nodularity, pleural or pericardial effusion. There is mild aortic and coronary artery atherosclerosis. 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 The largest indents the dome of the right hepatic lobe anteriorly, measuring 2.9 x 2.4 cm on image 10/2. There is a lesion more inferiorly near the gallbladder which measures 3.2 x 1.1 cm on image 21/2. A subcapsular lesion superior to the left lobe is best seen on the reformatted images, measuring up to 11 mm in thickness on sagittal image 63/6. No intraparenchymal hepatic metastases are identified. There is no evidence of gallstones, gallbladder wall thickening or biliary dilatation. Pancreas: Unremarkable. No pancreatic ductal dilatation or surrounding inflammatory changes. Spleen: Normal in size without focal abnormality. Adrenals/Urinary Tract: Both adrenal glands appear normal. The kidneys appear normal without  evidence of urinary tract calculus, suspicious lesion or hydronephrosis. No bladder abnormalities are seen. Stomach/Bowel: The stomach, small bowel, appendix and proximal colon demonstrate no significant findings. There is chronic diverticulosis and wall thickening of the sigmoid colon without evidence of surrounding inflammation. There is a chronic right periumbilical hernia which contains a portion of the transverse colon. No evidence of incarceration or obstruction. Vascular/Lymphatic: There are no enlarged abdominal or pelvic lymph nodes. Slight enlargement of a nodule posterior to the right external iliac vein is noted, measuring 8 mm on image 69/2. Given additional findings, this is probably a small peritoneal nodule. Aortic and branch vessel atherosclerosis without acute vascular findings. The portal, superior mesenteric and splenic veins are patent. Reproductive: Hysterectomy.  No adnexal mass. Other: Known peritoneal implant in the left upper quadrant between the stomach and spleen has mildly enlarged, measuring 4.2 x 3.8 cm on image 11/2. There are at least 2 new additional adjacent nodules medially on images 12 and 15 of series 2. As above, multiple new subcapsular hepatic metastases. No generalized ascites. Musculoskeletal: No acute or significant osseous findings. IMPRESSION: 1. 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 2. No evidence of ascites, bowel or ureteral obstruction. 3. 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 No evidence of incarceration or obstruction. 4. Aortic Atherosclerosis (ICD10-I70.0). Electronically Signed   By: WRichardean SaleM.D.   On: 05/22/2020 16:10

## 2020-05-25 NOTE — Telephone Encounter (Signed)
-----   Message from [name], MD sent at 05/25/2020  8:42 AM EDT ----- Regarding: pls help schedule ECHO this week for chemo

## 2020-05-25 NOTE — Telephone Encounter (Signed)
Called and left a message. Echo is scheduled for 8/13 at 10 am, arrive at 0945 for appt at [facility].

## 2020-05-25 NOTE — Assessment & Plan Note (Signed)
We discussed goals of care The plan is to continue treatment indefinitely She is aware that treatment goal is palliative in nature

## 2020-05-25 NOTE — Assessment & Plan Note (Signed)
Unfortunately, CT imaging show disease progression She had one episode of nausea vomiting several weeks ago but has not have any further nausea since She denies significant abdominal symptoms She has some vague abdominal pain We reviewed the current guidelines Based on her previous treatment record, in 2019, she have obtained maximum response to combination carboplatin and Doxil but did not progress on those regimen We discussed the risks, benefits, side effects of combination carboplatin with Doxil versus single agent carboplatin or single agent Doxil Ultimately, the patient has made informed decision to pursue combination treatment She is aware of side effects of allergic reaction, congestive heart failure, pancytopenia, infection and others We will order pretreatment echocardiogram I will cancel her current regimen and reschedule her treatment to start next week I recommend minimum 3 cycles of treatment before repeating imaging study

## 2020-05-29 ENCOUNTER — Other Ambulatory Visit: Payer: Self-pay

## 2020-05-29 ENCOUNTER — Ambulatory Visit (HOSPITAL_COMMUNITY)
Admission: RE | Admit: 2020-05-29 | Discharge: 2020-05-29 | Disposition: A | Discharge status: Home / Self Care | Payer: Medicare Other | Source: Ambulatory Visit | Attending: Hematology and Oncology | Admitting: Hematology and Oncology

## 2020-05-29 ENCOUNTER — Telehealth: Payer: Self-pay

## 2020-05-29 DIAGNOSIS — C5702 Malignant neoplasm of left fallopian tube: Secondary | ICD-10-CM | POA: Diagnosis not present

## 2020-05-29 DIAGNOSIS — I700 Atherosclerosis of aorta: Secondary | ICD-10-CM | POA: Insufficient documentation

## 2020-05-29 DIAGNOSIS — E785 Hyperlipidemia, unspecified: Secondary | ICD-10-CM | POA: Insufficient documentation

## 2020-05-29 DIAGNOSIS — Z01818 Encounter for other preprocedural examination: Secondary | ICD-10-CM | POA: Diagnosis not present

## 2020-05-29 DIAGNOSIS — Z5111 Encounter for antineoplastic chemotherapy: Secondary | ICD-10-CM | POA: Diagnosis not present

## 2020-05-29 DIAGNOSIS — C5700 Malignant neoplasm of unspecified fallopian tube: Secondary | ICD-10-CM | POA: Diagnosis not present

## 2020-05-29 LAB — ECHOCARDIOGRAM COMPLETE
Area-P 1/2: 2.87 cm2
S' Lateral: 2.6 cm

## 2020-05-29 NOTE — Telephone Encounter (Signed)
-----   Message from [name], MD sent at 05/29/2020  1:17 PM EDT ----- Regarding: let her know echo is ok

## 2020-05-29 NOTE — Progress Notes (Signed)
Echocardiogram 2D Echocardiogram has been performed.  Oneal Deputy Enzio Buchler 05/29/2020, 10:23 AM

## 2020-05-29 NOTE — Telephone Encounter (Signed)
Called and given below message. She verbalized understanding. 

## 2020-06-01 ENCOUNTER — Telehealth: Payer: Self-pay | Admitting: Oncology

## 2020-06-01 ENCOUNTER — Inpatient Hospital Stay: Payer: Medicare Other

## 2020-06-01 ENCOUNTER — Other Ambulatory Visit: Payer: Self-pay | Admitting: Hematology and Oncology

## 2020-06-01 ENCOUNTER — Other Ambulatory Visit: Payer: Self-pay

## 2020-06-01 VITALS — BP 122/70 | HR 70 | Temp 98.6°F | Resp 16

## 2020-06-01 DIAGNOSIS — C5700 Malignant neoplasm of unspecified fallopian tube: Secondary | ICD-10-CM

## 2020-06-01 DIAGNOSIS — C5702 Malignant neoplasm of left fallopian tube: Secondary | ICD-10-CM

## 2020-06-01 DIAGNOSIS — Z23 Encounter for immunization: Secondary | ICD-10-CM | POA: Diagnosis not present

## 2020-06-01 DIAGNOSIS — Z95828 Presence of other vascular implants and grafts: Secondary | ICD-10-CM

## 2020-06-01 DIAGNOSIS — Z7189 Other specified counseling: Secondary | ICD-10-CM

## 2020-06-01 DIAGNOSIS — Z5111 Encounter for antineoplastic chemotherapy: Secondary | ICD-10-CM | POA: Diagnosis not present

## 2020-06-01 DIAGNOSIS — C762 Malignant neoplasm of abdomen: Secondary | ICD-10-CM | POA: Diagnosis not present

## 2020-06-01 LAB — CBC WITH DIFFERENTIAL/PLATELET
Abs Immature Granulocytes: 0.01 10*3/uL (ref 0.00–0.07)
Basophils Absolute: 0 10*3/uL (ref 0.0–0.1)
Basophils Relative: 1 %
Eosinophils Absolute: 0.1 10*3/uL (ref 0.0–0.5)
Eosinophils Relative: 2 %
HCT: 35.3 % — ABNORMAL LOW (ref 36.0–46.0)
Hemoglobin: 11.5 g/dL — ABNORMAL LOW (ref 12.0–15.0)
Immature Granulocytes: 0 %
Lymphocytes Relative: 18 %
Lymphs Abs: 0.9 10*3/uL (ref 0.7–4.0)
MCH: 30.7 pg (ref 26.0–34.0)
MCHC: 32.6 g/dL (ref 30.0–36.0)
MCV: 94.1 fL (ref 80.0–100.0)
Monocytes Absolute: 0.6 10*3/uL (ref 0.1–1.0)
Monocytes Relative: 12 %
Neutro Abs: 3.4 10*3/uL (ref 1.7–7.7)
Neutrophils Relative %: 67 %
Platelets: 357 10*3/uL (ref 150–400)
RBC: 3.75 MIL/uL — ABNORMAL LOW (ref 3.87–5.11)
RDW: 14.3 % (ref 11.5–15.5)
WBC: 5.1 10*3/uL (ref 4.0–10.5)
nRBC: 0 % (ref 0.0–0.2)

## 2020-06-01 LAB — COMPREHENSIVE METABOLIC PANEL
ALT: 15 U/L (ref 0–44)
AST: 17 U/L (ref 15–41)
Albumin: 3.6 g/dL (ref 3.5–5.0)
Alkaline Phosphatase: 80 U/L (ref 38–126)
Anion gap: 11 (ref 5–15)
BUN: 16 mg/dL (ref 8–23)
CO2: 20 mmol/L — ABNORMAL LOW (ref 22–32)
Calcium: 9.4 mg/dL (ref 8.9–10.3)
Chloride: 104 mmol/L (ref 98–111)
Creatinine, Ser: 0.85 mg/dL (ref 0.44–1.00)
GFR calc Af Amer: >60 mL/min (ref >60)
GFR calc non Af Amer: >60 mL/min (ref >60)
Glucose, Bld: 101 mg/dL — ABNORMAL HIGH (ref 70–99)
Potassium: 4.5 mmol/L (ref 3.5–5.1)
Sodium: 135 mmol/L (ref 135–145)
Total Bilirubin: 0.3 mg/dL (ref 0.3–1.2)
Total Protein: 6.8 g/dL (ref 6.5–8.1)

## 2020-06-01 MED ORDER — SODIUM CHLORIDE 0.9% FLUSH
10.0000 mL | INTRAVENOUS | Status: DC | PRN
  Administered 2020-06-01: 10 mL
  Filled 2020-06-01: qty 10

## 2020-06-01 MED ORDER — SODIUM CHLORIDE 0.9 % IV SOLN
10.0000 mg | Freq: Once | INTRAVENOUS | Status: AC
  Administered 2020-06-01: 10 mg via INTRAVENOUS
  Filled 2020-06-01: qty 10

## 2020-06-01 MED ORDER — DIPHENHYDRAMINE HCL 50 MG/ML IJ SOLN
INTRAMUSCULAR | Status: AC
  Filled 2020-06-01: qty 1

## 2020-06-01 MED ORDER — DEXTROSE 5 % IV SOLN
Freq: Once | INTRAVENOUS | Status: AC
  Filled 2020-06-01: qty 250

## 2020-06-01 MED ORDER — SODIUM CHLORIDE 0.9 % IV SOLN
150.0000 mg | Freq: Once | INTRAVENOUS | Status: AC
  Administered 2020-06-01: 150 mg via INTRAVENOUS
  Filled 2020-06-01: qty 150

## 2020-06-01 MED ORDER — SODIUM CHLORIDE 0.9 % IV SOLN
372.0000 mg | Freq: Once | INTRAVENOUS | Status: AC
  Administered 2020-06-01: 370 mg via INTRAVENOUS
  Filled 2020-06-01: qty 37

## 2020-06-01 MED ORDER — PALONOSETRON HCL INJECTION 0.25 MG/5ML
0.2500 mg | Freq: Once | INTRAVENOUS | Status: AC
  Administered 2020-06-01: 0.25 mg via INTRAVENOUS

## 2020-06-01 MED ORDER — SODIUM CHLORIDE 0.9% FLUSH
10.0000 mL | Freq: Once | INTRAVENOUS | Status: AC
  Administered 2020-06-01: 10 mL
  Filled 2020-06-01: qty 10

## 2020-06-01 MED ORDER — DIPHENHYDRAMINE HCL 50 MG/ML IJ SOLN
25.0000 mg | Freq: Once | INTRAMUSCULAR | Status: AC
  Administered 2020-06-01: 25 mg via INTRAVENOUS

## 2020-06-01 MED ORDER — PROCHLORPERAZINE MALEATE 10 MG PO TABS: 10.0000 mg | ORAL_TABLET | Freq: Four times a day (QID) | ORAL | 1 refills | Status: DC | PRN

## 2020-06-01 MED ORDER — FAMOTIDINE IN NACL 20-0.9 MG/50ML-% IV SOLN
INTRAVENOUS | Status: AC
  Filled 2020-06-01: qty 50

## 2020-06-01 MED ORDER — DOXORUBICIN HCL LIPOSOMAL CHEMO INJECTION 2 MG/ML
28.0000 mg/m2 | Freq: Once | INTRAVENOUS | Status: AC
  Administered 2020-06-01: 50 mg via INTRAVENOUS
  Filled 2020-06-01: qty 25

## 2020-06-01 MED ORDER — PALONOSETRON HCL INJECTION 0.25 MG/5ML
INTRAVENOUS | Status: AC
  Filled 2020-06-01: qty 5

## 2020-06-01 MED ORDER — DOXORUBICIN HCL LIPOSOMAL CHEMO INJECTION 2 MG/ML: 30.0000 mg/m2 | Freq: Once | INTRAVENOUS | Status: DC

## 2020-06-01 MED ORDER — FAMOTIDINE IN NACL 20-0.9 MG/50ML-% IV SOLN
20.0000 mg | Freq: Once | INTRAVENOUS | Status: AC
  Administered 2020-06-01: 20 mg via INTRAVENOUS

## 2020-06-01 MED ORDER — HEPARIN SOD (PORK) LOCK FLUSH 100 UNIT/ML IV SOLN
500.0000 [IU] | Freq: Once | INTRAVENOUS | Status: AC | PRN
  Administered 2020-06-01: 500 [IU]
  Filled 2020-06-01: qty 5

## 2020-06-01 NOTE — Telephone Encounter (Signed)
Malon called and requested a refill of compazine 10 mg tablets.  She would like it sent to the [facility].

## 2020-06-01 NOTE — Patient Instructions (Signed)
[facility] Discharge Instructions for Patients Receiving Chemotherapy  Today you received the following chemotherapy agent: Doxil and Carboplatin  To help prevent nausea and vomiting after your treatment, we encourage you to take your nausea medication as directed by your MD.   If you develop nausea and vomiting that is not controlled by your nausea medication, call the clinic.   BELOW ARE SYMPTOMS THAT SHOULD BE REPORTED IMMEDIATELY:  *FEVER GREATER THAN 100.5 F  *CHILLS WITH OR WITHOUT FEVER  NAUSEA AND VOMITING THAT IS NOT CONTROLLED WITH YOUR NAUSEA MEDICATION  *UNUSUAL SHORTNESS OF BREATH  *UNUSUAL BRUISING OR BLEEDING  TENDERNESS IN MOUTH AND THROAT WITH OR WITHOUT PRESENCE OF ULCERS  *URINARY PROBLEMS  *BOWEL PROBLEMS  UNUSUAL RASH Items with * indicate a potential emergency and should be followed up as soon as possible.  Feel free to call the clinic should you have any questions or concerns. The clinic phone number is [phone].  Please show the [place] at check-in to the Emergency Department and triage nurse.

## 2020-06-01 NOTE — Telephone Encounter (Signed)
Left a message advising that the refill has been sent.

## 2020-06-01 NOTE — Telephone Encounter (Signed)
done

## 2020-06-01 NOTE — Patient Instructions (Signed)

## 2020-06-01 NOTE — Telephone Encounter (Signed)
Entered in error

## 2020-06-02 ENCOUNTER — Telehealth: Payer: Self-pay | Admitting: *Deleted

## 2020-06-02 LAB — CA 125: Cancer Antigen (CA) 125: 126 U/mL — ABNORMAL HIGH (ref 0.0–38.1)

## 2020-06-02 NOTE — Telephone Encounter (Signed)
Called pt to see how she did with her treatment yesterday & she reports doing well with no c/o's.  She was reminded to call with any concerns & she expressed understanding.

## 2020-06-02 NOTE — Telephone Encounter (Signed)
-----   Message from Ishmael Holter, RN sent at 06/01/2020  3:51 PM EDT ----- Regarding: 1st tx f/u call Dr. Maurilio Lovely 1st tx f/u call Dr. Alvy Bimler. Pt switched treatment to doxil/carbo. Tolerated well. Was on this treatment in 2019

## 2020-06-04 DIAGNOSIS — Z85820 Personal history of malignant melanoma of skin: Secondary | ICD-10-CM | POA: Diagnosis not present

## 2020-06-04 DIAGNOSIS — L84 Corns and callosities: Secondary | ICD-10-CM | POA: Diagnosis not present

## 2020-06-04 DIAGNOSIS — D485 Neoplasm of uncertain behavior of skin: Secondary | ICD-10-CM | POA: Diagnosis not present

## 2020-06-04 DIAGNOSIS — L821 Other seborrheic keratosis: Secondary | ICD-10-CM | POA: Diagnosis not present

## 2020-06-04 DIAGNOSIS — L570 Actinic keratosis: Secondary | ICD-10-CM | POA: Diagnosis not present

## 2020-06-08 ENCOUNTER — Other Ambulatory Visit: Payer: Medicare Other

## 2020-06-08 ENCOUNTER — Ambulatory Visit: Payer: Medicare Other

## 2020-06-08 ENCOUNTER — Ambulatory Visit: Payer: Medicare Other | Admitting: Hematology and Oncology

## 2020-06-11 ENCOUNTER — Telehealth: Payer: Self-pay | Admitting: Oncology

## 2020-06-11 DIAGNOSIS — D3132 Benign neoplasm of left choroid: Secondary | ICD-10-CM | POA: Diagnosis not present

## 2020-06-11 DIAGNOSIS — H04123 Dry eye syndrome of bilateral lacrimal glands: Secondary | ICD-10-CM | POA: Diagnosis not present

## 2020-06-11 DIAGNOSIS — Z961 Presence of intraocular lens: Secondary | ICD-10-CM | POA: Diagnosis not present

## 2020-06-11 NOTE — Telephone Encounter (Signed)
Make sure she brings her card, they turned away a patient who forgot her card

## 2020-06-11 NOTE — Telephone Encounter (Signed)
Jessica Johnston called and asked if she can get Jessica Johnston booster at the [facility]  She had her second vaccine in February.  Advised her that we can send a scheduling message and they will call her with an appointment to get the booster.

## 2020-06-15 ENCOUNTER — Other Ambulatory Visit: Payer: Self-pay

## 2020-06-15 ENCOUNTER — Inpatient Hospital Stay: Payer: Medicare Other

## 2020-06-15 DIAGNOSIS — Z23 Encounter for immunization: Secondary | ICD-10-CM

## 2020-06-29 ENCOUNTER — Inpatient Hospital Stay: Payer: Medicare Other | Attending: Gynecologic Oncology

## 2020-06-29 ENCOUNTER — Other Ambulatory Visit: Payer: Medicare Other

## 2020-06-29 ENCOUNTER — Inpatient Hospital Stay: Payer: Medicare Other

## 2020-06-29 ENCOUNTER — Other Ambulatory Visit: Payer: Self-pay

## 2020-06-29 ENCOUNTER — Encounter: Payer: Self-pay | Admitting: Hematology and Oncology

## 2020-06-29 ENCOUNTER — Inpatient Hospital Stay (HOSPITAL_BASED_OUTPATIENT_CLINIC_OR_DEPARTMENT_OTHER): Payer: Medicare Other | Admitting: Hematology and Oncology

## 2020-06-29 ENCOUNTER — Ambulatory Visit: Payer: Medicare Other

## 2020-06-29 DIAGNOSIS — C5700 Malignant neoplasm of unspecified fallopian tube: Secondary | ICD-10-CM | POA: Diagnosis not present

## 2020-06-29 DIAGNOSIS — D61818 Other pancytopenia: Secondary | ICD-10-CM | POA: Diagnosis not present

## 2020-06-29 DIAGNOSIS — C5702 Malignant neoplasm of left fallopian tube: Secondary | ICD-10-CM | POA: Diagnosis not present

## 2020-06-29 DIAGNOSIS — Z7189 Other specified counseling: Secondary | ICD-10-CM

## 2020-06-29 DIAGNOSIS — Z5111 Encounter for antineoplastic chemotherapy: Secondary | ICD-10-CM | POA: Insufficient documentation

## 2020-06-29 DIAGNOSIS — C762 Malignant neoplasm of abdomen: Secondary | ICD-10-CM | POA: Diagnosis not present

## 2020-06-29 DIAGNOSIS — Z95828 Presence of other vascular implants and grafts: Secondary | ICD-10-CM

## 2020-06-29 LAB — CBC WITH DIFFERENTIAL/PLATELET
Abs Immature Granulocytes: 0.01 10*3/uL (ref 0.00–0.07)
Basophils Absolute: 0 10*3/uL (ref 0.0–0.1)
Basophils Relative: 1 %
Eosinophils Absolute: 0 10*3/uL (ref 0.0–0.5)
Eosinophils Relative: 1 %
HCT: 31 % — ABNORMAL LOW (ref 36.0–46.0)
Hemoglobin: 10.3 g/dL — ABNORMAL LOW (ref 12.0–15.0)
Immature Granulocytes: 0 %
Lymphocytes Relative: 24 %
Lymphs Abs: 0.8 10*3/uL (ref 0.7–4.0)
MCH: 30.7 pg (ref 26.0–34.0)
MCHC: 33.2 g/dL (ref 30.0–36.0)
MCV: 92.5 fL (ref 80.0–100.0)
Monocytes Absolute: 0.6 10*3/uL (ref 0.1–1.0)
Monocytes Relative: 20 %
Neutro Abs: 1.7 10*3/uL (ref 1.7–7.7)
Neutrophils Relative %: 54 %
Platelets: 279 10*3/uL (ref 150–400)
RBC: 3.35 MIL/uL — ABNORMAL LOW (ref 3.87–5.11)
RDW: 14.4 % (ref 11.5–15.5)
WBC: 3.2 10*3/uL — ABNORMAL LOW (ref 4.0–10.5)
nRBC: 0 % (ref 0.0–0.2)

## 2020-06-29 LAB — COMPREHENSIVE METABOLIC PANEL WITH GFR
ALT: 15 U/L (ref 0–44)
AST: 16 U/L (ref 15–41)
Albumin: 3.4 g/dL — ABNORMAL LOW (ref 3.5–5.0)
Alkaline Phosphatase: 84 U/L (ref 38–126)
Anion gap: 6 (ref 5–15)
BUN: 14 mg/dL (ref 8–23)
CO2: 26 mmol/L (ref 22–32)
Calcium: 9.2 mg/dL (ref 8.9–10.3)
Chloride: 103 mmol/L (ref 98–111)
Creatinine, Ser: 0.82 mg/dL (ref 0.44–1.00)
GFR calc Af Amer: >60 mL/min
GFR calc non Af Amer: >60 mL/min
Glucose, Bld: 106 mg/dL — ABNORMAL HIGH (ref 70–99)
Potassium: 4.9 mmol/L (ref 3.5–5.1)
Sodium: 135 mmol/L (ref 135–145)
Total Bilirubin: 0.3 mg/dL (ref 0.3–1.2)
Total Protein: 6.8 g/dL (ref 6.5–8.1)

## 2020-06-29 MED ORDER — SODIUM CHLORIDE 0.9 % IV SOLN
372.0000 mg | Freq: Once | INTRAVENOUS | Status: AC
  Administered 2020-06-29: 370 mg via INTRAVENOUS
  Filled 2020-06-29: qty 37

## 2020-06-29 MED ORDER — DIPHENHYDRAMINE HCL 50 MG/ML IJ SOLN
25.0000 mg | Freq: Once | INTRAMUSCULAR | Status: AC
  Administered 2020-06-29: 25 mg via INTRAVENOUS

## 2020-06-29 MED ORDER — DIPHENHYDRAMINE HCL 50 MG/ML IJ SOLN
INTRAMUSCULAR | Status: AC
  Filled 2020-06-29: qty 1

## 2020-06-29 MED ORDER — SODIUM CHLORIDE 0.9% FLUSH
10.0000 mL | Freq: Once | INTRAVENOUS | Status: AC
  Administered 2020-06-29: 10 mL
  Filled 2020-06-29: qty 10

## 2020-06-29 MED ORDER — FAMOTIDINE IN NACL 20-0.9 MG/50ML-% IV SOLN
INTRAVENOUS | Status: AC
  Filled 2020-06-29: qty 50

## 2020-06-29 MED ORDER — SODIUM CHLORIDE 0.9% FLUSH
10.0000 mL | INTRAVENOUS | Status: DC | PRN
  Administered 2020-06-29: 10 mL
  Filled 2020-06-29: qty 10

## 2020-06-29 MED ORDER — HEPARIN SOD (PORK) LOCK FLUSH 100 UNIT/ML IV SOLN
500.0000 [IU] | Freq: Once | INTRAVENOUS | Status: AC | PRN
  Administered 2020-06-29: 500 [IU]
  Filled 2020-06-29: qty 5

## 2020-06-29 MED ORDER — SODIUM CHLORIDE 0.9 % IV SOLN
150.0000 mg | Freq: Once | INTRAVENOUS | Status: AC
  Administered 2020-06-29: 150 mg via INTRAVENOUS
  Filled 2020-06-29: qty 150

## 2020-06-29 MED ORDER — PALONOSETRON HCL INJECTION 0.25 MG/5ML
INTRAVENOUS | Status: AC
  Filled 2020-06-29: qty 5

## 2020-06-29 MED ORDER — SODIUM CHLORIDE 0.9 % IV SOLN
10.0000 mg | Freq: Once | INTRAVENOUS | Status: AC
  Administered 2020-06-29: 10 mg via INTRAVENOUS
  Filled 2020-06-29: qty 10

## 2020-06-29 MED ORDER — FAMOTIDINE IN NACL 20-0.9 MG/50ML-% IV SOLN
20.0000 mg | Freq: Once | INTRAVENOUS | Status: AC
  Administered 2020-06-29: 20 mg via INTRAVENOUS

## 2020-06-29 MED ORDER — DOXORUBICIN HCL LIPOSOMAL CHEMO INJECTION 2 MG/ML
29.0000 mg/m2 | Freq: Once | INTRAVENOUS | Status: AC
  Administered 2020-06-29: 50 mg via INTRAVENOUS
  Filled 2020-06-29: qty 25

## 2020-06-29 MED ORDER — PALONOSETRON HCL INJECTION 0.25 MG/5ML
0.2500 mg | Freq: Once | INTRAVENOUS | Status: AC
  Administered 2020-06-29: 0.25 mg via INTRAVENOUS

## 2020-06-29 MED ORDER — DEXTROSE 5 % IV SOLN
Freq: Once | INTRAVENOUS | Status: AC
  Filled 2020-06-29: qty 250

## 2020-06-29 NOTE — Assessment & Plan Note (Signed)
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

## 2020-06-29 NOTE — Progress Notes (Signed)
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   All questions were answered. The patient knows to call the clinic with any problems, questions or concerns. The total time spent in the appointment was 20 minutes encounter with patients including review of chart and various tests results, discussions about plan of care and coordination of care plan   [name], MD 06/29/2020 10:17 AM  INTERVAL HISTORY: Please see below for problem oriented charting. 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 Extensive omental nodularity highly worrisome for peritoneal carcinomatosis. Late recurrence of melanoma can present as peritoneal carcinomatosis. Ovarian cancer more commonly presents in this manner. Patient does have a predominately low density right adnexal lesion measuring up to 3.5 cm, although this lesion is not typical for ovarian cancer. 2. No evidence of bowel or ureteral obstruction. 3. No other definite signs of metastatic disease. There are small indeterminate low-density hepatic lesions.   02/08/2016 Tumor Marker   Patient's tumor was tested for the following markers: CA-125 Results of the tumor marker test revealed 656.2   02/15/2016 Procedure   CT-guided core biopsy performed of omental mass just deep to the abdominal wall.   02/15/2016 Pathology Results   Omentum, biopsy, left - PAPILLARY SEROUS NEOPLASM, SEE COMMENT. Microscopic Comment There are papillary collections of largely low grade appearing cells with numerous psammoma bodies. Given the limited material it is difficult to distinguish between invasive implants of a serous borderline tumor and serous carcinoma, especially given the low grade appearance.   02/18/2016 Pathology Results   1. Omentum, resection for tumor INVASIVE IMPLANT OF HIGH GRADE SEROUS CARCINOMA 2. Uterus +/- tubes/ovaries, neoplastic HIGH GRADE SEROUS CARCINOMA INVOLVING LEFT TUBAL FIMBRIA SEROUS CARCINOMA WITH PREDOMINANT PSAMMOMA BODIES IMPLANT AT UTERUS SEROSA, BILATERAL FALLOPIAN TUBAL SEROSA, AND ANTERIOR PERITONEAL REFLECTION CERVIX: HISTOLOGICAL UNREMARKABLE ENDOMETRIUM: INACTIVE ENDOMETRIUM MYOMETRIUM: LEIOMYOMA LEFT OVARY: CYSTADENOFIBROMA RIGHT OVARY AND FALLOPIAN TUBE: HISTOLOGICAL UNREMARKABLE Microscopic Comment 2. ONCOLOGY TABLE - FALLOPIAN TUBE 1. Specimen, including laterality: Omentum, uterus, bilateral ovaries and fallopian tubes 2. Procedure: Hysterectomy, bilateral  salpingo-oophorectomy and tumor debulking omentectomy 3. Lymph node sampling performed: No  4. Tumor site: uterus serosa, bilateral fallopian tubal serosa, peritoneal and omentum 5. Tumor location in fallopian tube: Left fallopian tubal fimbria 6. Specimen integrity (intact/ruptured/disrupted): Intact 7. Tumor size (cm): multi focal invasive omentum implant greater than 2 cm, left fallopian tube tumor 0.8 cm. 8. Histologic type: Serous carcinoma 9. Grade: 3 10. Microscopic tumor extension: uterus serosa, bilateral fallopian tubal serosa, peritoneal and omentum 11. Margins: NA 12. Lymph-Vascular invasion: identified 13. Lymph nodes: # examined: 0; # positive: NA 14. TNM: pT3c, pNx 15. FIGO Stage (based on pathologic findings, needs clinical correlation: IIIC 16. Comment: High grade serous carcinoma multifocally and extensively involves left fallopian tubal fimbria, omentum, uterus serosa, bilateral fallopian tubal serosa and peritoneal. At the left fallopian tube there are small foci of serous tubal intraepithelial carcinoma identified, so we conclude the carcinoma is fallopian tube origin   03/09/2016 Tumor Marker   Patient's tumor was tested for the following markers: CA-125 Results of the tumor marker test revealed 154.4   03/15/2016 Procedure   Technically successful right IJ power-injectable port catheter placement. Ready for routine use.   03/18/2016 - 07/13/2016 Chemotherapy   The patient had 6 cycles of carboplatin and taxol   04/14/2016 Tumor Marker   Patient's tumor was tested for the following markers: CA-125 Results of the tumor marker test revealed 36.7   04/25/2016 Genetic Testing   Patient has genetic testing done for germline mutation Results revealed patient has no mutation   04/28/2016 Tumor Marker   Patient's tumor was tested for the following markers: CA-125 Results of the tumor marker test revealed 24.6   06/21/2016 Tumor Marker   Patient's tumor was tested for the  following markers: CA-125 Results of the tumor marker test revealed 16.6   08/01/2016 Tumor Marker   Patient's tumor was tested for the following markers: CA-125 Results of the tumor marker test revealed 17.2   08/05/2016 Imaging   Interval TAH-BSO. No evidence of residual pelvic mass or metastatic disease within the abdomen or pelvis. No other acute findings.    11/04/2016 Tumor Marker   Patient's tumor was tested for the following markers: CA-125 Results of the tumor marker test revealed 13.6   01/20/2017 Tumor Marker   Patient's tumor was tested for the following markers: CA-125 Results of the tumor marker test revealed 13.8   04/14/2017 Tumor Marker   Patient's tumor was tested for the following markers: CA-125 Results of the tumor marker test revealed 16.1   06/13/2017 Imaging   No acute findings.  No mass or hernia identified.  Colonic diverticulosis, without radiographic evidence of diverticulitis.  Aortic atherosclerosis.   07/03/2017 Tumor Marker   Patient's tumor was tested for the following markers: CA-125 Results of the tumor marker test revealed 18.4   09/19/2017 Tumor Marker   Patient's tumor was tested for the following markers: CA-125 Results of the tumor marker test revealed 18.5   01/23/2018 Tumor Marker   Patient's tumor was tested for the following markers: CA-125 Results of the tumor marker test revealed 35.4   01/30/2018 Imaging   1. No evidence of local fallopian tube carcinoma recurrence within the pelvis. Post hysterectomy. 2. No evidence of metastatic peritoneal disease or omental disease. No solid organ metastasis.    03/20/2018 Tumor Marker   Patient's tumor was tested for the following markers: CA-125 Results of the tumor marker test revealed 73   05/02/2018 Tumor Marker   Patient's tumor was tested for the following markers: CA-125 Results of the tumor marker test  revealed 127.1   05/08/2018 Imaging   CT abdomen and pelvis 1. 7 cm left  subdiaphragmatic collection of loculated fluid versus low-density soft tissue. Given the patient's history of rising CA 125, metastatic disease considered highly likely.  2. Areas of fluid or recurrent disease identified in the right lower quadrant adjacent to the cecum and along right lower quadrant small bowel loops.   05/14/2018 Tumor Marker   Patient's tumor was tested for the following markers: CA-125 Results of the tumor marker test revealed 166    Genetic Testing   Patient has genetic testing done for ER/PR and MMR. Results revealed patient has the following on 02/18/2016 surgical pathology: ER 20%, PR 0% MMR: normal    Genetic Testing   Patient has genetic testing done for MSI. Results revealed patient has the following: MSI stable   05/18/2018 - 12/31/2018 Chemotherapy   The patient had carboplatin and Doxil   07/16/2018 Tumor Marker   Patient's tumor was tested for the following markers: CA-125 Results of the tumor marker test revealed 28.7   08/10/2018 Imaging   CT imaging:  Decreased size of peritoneal soft tissue masses in left upper quadrant, consistent with decreased metastatic disease.  No new or progressive metastatic disease. No other acute findings.  Colonic diverticulosis, without radiographic evidence of diverticulitis.  Stable small paraumbilical ventral hernia containing transverse colon.   08/13/2018 Tumor Marker   Patient's tumor was tested for the following markers: CA-125 Results of the tumor marker test revealed 21.6   08/24/2018 Echocardiogram   LV EF: 60% -  65%   10/15/2018 Tumor Marker   Patient's tumor was tested for the following markers: CA-125 Results of the tumor marker test revealed 20.1   10/19/2018 Imaging   Ct scan of abdomen and pelvis Status post hysterectomy, bilateral salpingo-oophorectomy, and omentectomy.  Two peritoneal implants in the left upper abdomen are stable versus mildly decreased, as above. No abdominopelvic  ascites.  No evidence of new/progressive metastatic disease.     11/19/2018 Tumor Marker   Patient's tumor was tested for the following markers: CA-125 Results of the tumor marker test revealed 21.7   11/23/2018 Echocardiogram   1. The left ventricle has normal systolic function of 49-70%. The cavity size was normal. There is no increased left ventricular wall thickness. Echo evidence of impaired diastolic relaxation.  2. The right ventricle has normal systolic function. The cavity was normal. There is no increase in right ventricular wall thickness.  3. The mitral valve is normal in structure. There is mild mitral annular calcification present.  4. The tricuspid valve is normal in structure.  5. The aortic valve is tricuspid There is mild thickening of the aortic valve.  6. The pulmonic valve was normal in structure. Pulmonic valve regurgitation is mild by color flow Doppler.  7. Normal LV systolic function; mild diastolic dysfunction.   12/31/2018 Tumor Marker   Patient's tumor was tested for the following markers: CA-125 Results of the tumor marker test revealed 17.1   01/31/2019 Imaging   1. Left upper quadrant peritoneal implants described previously have decreased in the interval. No new or progressive findings in the abdomen/pelvis today. No free fluid. 2. Right paraumbilical ventral hernia contains a small knuckle of transverse colon without complicating features. 3.  Aortic Atherosclerois (ICD10-170.0)  Aortic Atherosclerosis (ICD10-I70.0).   01/31/2019 Tumor Marker   Patient's tumor was tested for the following markers: CA-125 Results of the tumor marker test revealed 18.9   02/11/2019 - 03/13/2019 Chemotherapy  The patient is taking Niraparib. She self discontinued after 1 month   02/25/2019 Tumor Marker   Patient's tumor was tested for the following markers: CA-125 Results of the tumor marker test revealed 18.6   05/13/2019 Tumor Marker   Patient's tumor was tested  for the following markers: CA-125 Results of the tumor marker test revealed 230   05/22/2019 Imaging   1. Increased size of 4.6 cm soft tissue mass in the gastrosplenic ligament, consistent with metastatic disease. 2. No other sites of metastatic disease identified within the abdomen or pelvis. 3. Colonic diverticulosis. No radiographic evidence of diverticulitis. 4. Stable small right paraumbilical hernia containing transverse colon.   Aortic Atherosclerosis (ICD10-I70.0).   05/31/2019 - 08/22/2019 Chemotherapy   The patient had bevacizumab and Taxol for chemotherapy treatment.     05/31/2019 Tumor Marker   Patient's tumor was tested for the following markers: CA-125 Results of the tumor marker test revealed 84   06/21/2019 Tumor Marker   Patient's tumor was tested for the following markers: CA-125 Results of the tumor marker test revealed 49.4.   07/19/2019 Tumor Marker   Patient's tumor was tested for the following markers: CA-125. Results of the tumor marker test revealed 44.5   08/22/2019 Imaging   1. Increase in size of 5.0 cm mass in the gastro splenic ligament consistent with metastatic disease. 2. No additional sites of disease identified within the abdomen or pelvis. 3. Unchanged paraumbilical hernia containing nonobstructed loop of large bowel. 4.  Aortic Atherosclerosis (ICD10-I70.0).     08/30/2019 Tumor Marker   Patient's tumor was tested for the following markers: CA-125 Results of the tumor marker test revealed 71.5   08/30/2019 - 05/11/2020 Chemotherapy   The patient had gemzar for chemotherapy treatment.     09/27/2019 Tumor Marker   Patient's tumor was tested for the following markers: CA-125. Results of the tumor marker test revealed 38.5   10/14/2019 Tumor Marker   Patient's tumor was tested for the following markers: CA-125 Results of the tumor marker test revealed 40.8   10/21/2019 Tumor Marker   Patient's tumor was tested for the following markers:  CA-125 Results of the tumor marker test revealed 34.8.   11/22/2019 Imaging   1. Interval decrease in size of left upper quadrant cystic and solid peritoneal lesion. 2. No new sites of disease. 3. Unchanged periumbilical hernia containing a nonobstructed loop of small bowel. 4.  Aortic Atherosclerosis (ICD10-I70.0).     11/25/2019 Tumor Marker   Patient's tumor was tested for the following markers: CA-125 Results of the tumor marker test revealed 22.7   12/23/2019 Tumor Marker   Patient's tumor was tested for the following markers: CA-125 Results of the tumor marker test revealed 26.3   01/20/2020 Tumor Marker   Patient's tumor was tested for the following markers: CA-125 Results of the tumor marker test revealed 28.3   02/14/2020 Imaging   1. 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 2. No new or progressive disease identified within the abdomen or pelvis. 3. Colonic diverticulosis. No radiographic evidence of diverticulitis. 4. Small paraumbilical ventral hernia.     02/17/2020 Tumor Marker   Patient's tumor was tested for the following markers: CA-125 Results of the tumor marker test revealed 40.1   05/11/2020 Tumor Marker   Patient's tumor was tested for the following markers: CA-125 Results of the tumor marker test revealed 113.   05/22/2020 Imaging   1. Progressive peritoneal metastatic disease with new  subcapsular hepatic metastases and mild enlargement of the peritoneal implant in the left upper quadrant. 2. No evidence of ascites, bowel or ureteral obstruction. 3. 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 No evidence of incarceration or obstruction. 4. Aortic Atherosclerosis (ICD10-I70.0).   05/22/2020 Tumor Marker   Patient's tumor was tested for the following markers: CA-125 Results of the tumor marker test revealed 118   05/29/2020 Echocardiogram   1. Normal GLS -19.8. Left ventricular  ejection fraction, by estimation, is 55 to 60%. The left ventricle has normal function. The left ventricle has no regional wall motion abnormalities. Left ventricular diastolic parameters were normal.  2. Right ventricular systolic function is normal. The right ventricular size is normal.  3. The mitral valve is degenerative. Trivial mitral valve regurgitation. No evidence of mitral stenosis.  4. The aortic valve is tricuspid. Aortic valve regurgitation is not visualized. Mild to moderate aortic valve sclerosis/calcification is present, without any evidence of aortic stenosis.  5. The inferior vena cava is normal in size with greater than 50% respiratory variability, suggesting right atrial pressure of 3 mmHg.   06/01/2020 -  Chemotherapy   The patient had carboplatin and doxil for chemotherapy treatment.     06/01/2020 Tumor Marker   Patient's tumor was tested for the following markers: CA-125 Results of the tumor marker test revealed 126.     REVIEW OF SYSTEMS:   Constitutional: Denies fevers, chills or abnormal weight loss Eyes: Denies blurriness of vision Ears, nose, mouth, throat, and face: Denies mucositis or sore throat Respiratory: Denies cough, dyspnea or wheezes Cardiovascular: Denies palpitation, chest discomfort or lower extremity swelling Gastrointestinal:  Denies nausea, heartburn or change in bowel habits Skin: Denies abnormal skin rashes Lymphatics: Denies new lymphadenopathy or easy bruising Neurological:Denies numbness, tingling or new weaknesses Behavioral/Psych: Mood is stable, no new changes  All other systems were reviewed with the patient and are negative.  I have reviewed the past medical history, past surgical history, social history and family history with the patient and they are unchanged from previous note.  ALLERGIES:  has No Known Allergies.  MEDICATIONS:  Current Outpatient Medications  Medication Sig Dispense Refill  . Cholecalciferol (VITAMIN D)  2000 units CAPS Take 1 capsule by mouth daily.    Marland Kitchen ezetimibe (ZETIA) 10 MG tablet Take 10 mg by mouth daily.    Marland Kitchen lidocaine-prilocaine (EMLA) cream Apply to affected area once 30 g 3  . morphine (MSIR) 15 MG tablet Take 1 tablet (15 mg total) by mouth every 4 (four) hours as needed for severe pain. 30 tablet 0  . ondansetron (ZOFRAN) 8 MG tablet Take 1 tablet (8 mg total) by mouth 2 (two) times daily as needed (Nausea or vomiting). 30 tablet 1  . prochlorperazine (COMPAZINE) 10 MG tablet Take 1 tablet (10 mg total) by mouth every 6 (six) hours as needed (Nausea or vomiting). 60 tablet 1   No current facility-administered medications for this visit.    PHYSICAL EXAMINATION: ECOG PERFORMANCE STATUS: 1 - Symptomatic but completely ambulatory  Vitals:   06/29/20 0932  BP: (!) 154/65  Pulse: 69  Resp: 18  Temp: (!) 97.2 F (36.2 C)  SpO2: 100%   Filed Weights   06/29/20 0932  Weight: 149 lb 9.6 oz (67.9 kg)    GENERAL:alert, no distress and comfortable SKIN: skin color, texture, turgor are normal, no rashes or significant lesions EYES: normal, Conjunctiva are pink and non-injected, sclera clear OROPHARYNX:no exudate, no erythema and lips,  buccal mucosa, and tongue normal  NECK: supple, thyroid normal size, non-tender, without nodularity LYMPH:  no palpable lymphadenopathy in the cervical, axillary or inguinal LUNGS: clear to auscultation and percussion with normal breathing effort HEART: regular rate & rhythm and no murmurs and no lower extremity edema ABDOMEN:abdomen soft, non-tender and normal bowel sounds Musculoskeletal:no cyanosis of digits and no clubbing  NEURO: alert & oriented x 3 with fluent speech, no focal motor/sensory deficits  LABORATORY DATA:  I have reviewed the data as listed    Component Value Date/Time   NA 135 06/29/2020 0913   NA 139 06/07/2017 1430   K 4.9 06/29/2020 0913   K 3.8 06/07/2017 1430   CL 103 06/29/2020 0913   CO2 26 06/29/2020 0913    CO2 27 06/07/2017 1430   GLUCOSE 106 (H) 06/29/2020 0913   GLUCOSE 124 06/07/2017 1430   BUN 14 06/29/2020 0913   BUN 14.0 06/07/2017 1430   CREATININE 0.82 06/29/2020 0913   CREATININE 0.78 01/20/2020 1029   CREATININE 0.8 06/07/2017 1430   CALCIUM 9.2 06/29/2020 0913   CALCIUM 9.3 06/07/2017 1430   PROT 6.8 06/29/2020 0913   PROT 6.6 09/30/2016 1027   ALBUMIN 3.4 (L) 06/29/2020 0913   ALBUMIN 3.5 09/30/2016 1027   AST 16 06/29/2020 0913   AST 20 01/20/2020 1029   AST 18 09/30/2016 1027   ALT 15 06/29/2020 0913   ALT 28 01/20/2020 1029   ALT 21 09/30/2016 1027   ALKPHOS 84 06/29/2020 0913   ALKPHOS 77 09/30/2016 1027   BILITOT 0.3 06/29/2020 0913   BILITOT 0.2 (L) 01/20/2020 1029   BILITOT 0.37 09/30/2016 1027   GFRNONAA >60 06/29/2020 0913   GFRNONAA >60 01/20/2020 1029   GFRAA >60 06/29/2020 0913   GFRAA >60 01/20/2020 1029    No results found for: SPEP, UPEP  Lab Results  Component Value Date   WBC 3.2 (L) 06/29/2020   NEUTROABS 1.7 06/29/2020   HGB 10.3 (L) 06/29/2020   HCT 31.0 (L) 06/29/2020   MCV 92.5 06/29/2020   PLT 279 06/29/2020      Chemistry      Component Value Date/Time   NA 135 06/29/2020 0913   NA 139 06/07/2017 1430   K 4.9 06/29/2020 0913   K 3.8 06/07/2017 1430   CL 103 06/29/2020 0913   CO2 26 06/29/2020 0913   CO2 27 06/07/2017 1430   BUN 14 06/29/2020 0913   BUN 14.0 06/07/2017 1430   CREATININE 0.82 06/29/2020 0913   CREATININE 0.78 01/20/2020 1029   CREATININE 0.8 06/07/2017 1430      Component Value Date/Time   CALCIUM 9.2 06/29/2020 0913   CALCIUM 9.3 06/07/2017 1430   ALKPHOS 84 06/29/2020 0913   ALKPHOS 77 09/30/2016 1027   AST 16 06/29/2020 0913   AST 20 01/20/2020 1029   AST 18 09/30/2016 1027   ALT 15 06/29/2020 0913   ALT 28 01/20/2020 1029   ALT 21 09/30/2016 1027   BILITOT 0.3 06/29/2020 0913   BILITOT 0.2 (L) 01/20/2020 1029   BILITOT 0.37 09/30/2016 1027

## 2020-06-29 NOTE — Patient Instructions (Signed)

## 2020-06-29 NOTE — Patient Instructions (Signed)
[place] Discharge Instructions for Patients Receiving Chemotherapy  Today you received the following chemotherapy agents: Doxorubicin Liposomal and Carboplatin  To help prevent nausea and vomiting after your treatment, we encourage you to take your nausea medication as directed by your MD.   If you develop nausea and vomiting that is not controlled by your nausea medication, call the clinic.   BELOW ARE SYMPTOMS THAT SHOULD BE REPORTED IMMEDIATELY:  *FEVER GREATER THAN 100.5 F  *CHILLS WITH OR WITHOUT FEVER  NAUSEA AND VOMITING THAT IS NOT CONTROLLED WITH YOUR NAUSEA MEDICATION  *UNUSUAL SHORTNESS OF BREATH  *UNUSUAL BRUISING OR BLEEDING  TENDERNESS IN MOUTH AND THROAT WITH OR WITHOUT PRESENCE OF ULCERS  *URINARY PROBLEMS  *BOWEL PROBLEMS  UNUSUAL RASH Items with * indicate a potential emergency and should be followed up as soon as possible.  Feel free to call the clinic should you have any questions or concerns. The clinic phone number is [phone].  Please show the [place] at check-in to the Emergency Department and triage nurse.

## 2020-06-29 NOTE — Assessment & Plan Note (Signed)
She has some rare occasional abdominal discomfort but denies nausea, bloating or changes in bowel habits We will observe for now

## 2020-06-29 NOTE — Assessment & Plan Note (Signed)
So far, she tolerated treatment very well without major side effects apart from mild pancytopenia She has rare occasional abdominal discomfort I recommend we proceed with treatment for minimum 3 cycles before repeating imaging studies She is in agreement

## 2020-06-30 LAB — CA 125: Cancer Antigen (CA) 125: 108 U/mL — ABNORMAL HIGH (ref 0.0–38.1)

## 2020-07-02 ENCOUNTER — Telehealth: Payer: Self-pay | Admitting: Oncology

## 2020-07-02 NOTE — Telephone Encounter (Signed)
Jessica Johnston called and asked if it would be ok to have her teeth cleaned.

## 2020-07-03 NOTE — Telephone Encounter (Signed)
yes

## 2020-07-03 NOTE — Telephone Encounter (Signed)
Called Nuri back and let her know she can have her teeth cleaned.

## 2020-07-27 ENCOUNTER — Other Ambulatory Visit: Payer: Self-pay

## 2020-07-27 ENCOUNTER — Inpatient Hospital Stay (HOSPITAL_BASED_OUTPATIENT_CLINIC_OR_DEPARTMENT_OTHER): Payer: Medicare Other | Admitting: Hematology and Oncology

## 2020-07-27 ENCOUNTER — Encounter: Payer: Self-pay | Admitting: Hematology and Oncology

## 2020-07-27 ENCOUNTER — Inpatient Hospital Stay: Payer: Medicare Other

## 2020-07-27 ENCOUNTER — Inpatient Hospital Stay: Payer: Medicare Other | Attending: Gynecologic Oncology

## 2020-07-27 VITALS — BP 148/58 | HR 73 | Temp 98.0°F | Resp 18 | Ht 64.0 in | Wt 148.2 lb

## 2020-07-27 DIAGNOSIS — C5702 Malignant neoplasm of left fallopian tube: Secondary | ICD-10-CM

## 2020-07-27 DIAGNOSIS — C5700 Malignant neoplasm of unspecified fallopian tube: Secondary | ICD-10-CM

## 2020-07-27 DIAGNOSIS — Z5111 Encounter for antineoplastic chemotherapy: Secondary | ICD-10-CM | POA: Diagnosis not present

## 2020-07-27 DIAGNOSIS — C762 Malignant neoplasm of abdomen: Secondary | ICD-10-CM

## 2020-07-27 DIAGNOSIS — Z23 Encounter for immunization: Secondary | ICD-10-CM | POA: Diagnosis not present

## 2020-07-27 DIAGNOSIS — Z95828 Presence of other vascular implants and grafts: Secondary | ICD-10-CM

## 2020-07-27 DIAGNOSIS — Z7189 Other specified counseling: Secondary | ICD-10-CM

## 2020-07-27 DIAGNOSIS — R634 Abnormal weight loss: Secondary | ICD-10-CM | POA: Diagnosis not present

## 2020-07-27 DIAGNOSIS — D61818 Other pancytopenia: Secondary | ICD-10-CM | POA: Diagnosis not present

## 2020-07-27 LAB — COMPREHENSIVE METABOLIC PANEL WITH GFR
ALT: 15 U/L (ref 0–44)
AST: 14 U/L — ABNORMAL LOW (ref 15–41)
Albumin: 3.3 g/dL — ABNORMAL LOW (ref 3.5–5.0)
Alkaline Phosphatase: 86 U/L (ref 38–126)
Anion gap: 7 (ref 5–15)
BUN: 15 mg/dL (ref 8–23)
CO2: 26 mmol/L (ref 22–32)
Calcium: 9.2 mg/dL (ref 8.9–10.3)
Chloride: 104 mmol/L (ref 98–111)
Creatinine, Ser: 0.81 mg/dL (ref 0.44–1.00)
GFR, Estimated: >60 mL/min
Glucose, Bld: 86 mg/dL (ref 70–99)
Potassium: 4.5 mmol/L (ref 3.5–5.1)
Sodium: 137 mmol/L (ref 135–145)
Total Bilirubin: 0.3 mg/dL (ref 0.3–1.2)
Total Protein: 6.4 g/dL — ABNORMAL LOW (ref 6.5–8.1)

## 2020-07-27 LAB — CBC WITH DIFFERENTIAL/PLATELET
Abs Immature Granulocytes: 0.01 10*3/uL (ref 0.00–0.07)
Basophils Absolute: 0 10*3/uL (ref 0.0–0.1)
Basophils Relative: 0 %
Eosinophils Absolute: 0 10*3/uL (ref 0.0–0.5)
Eosinophils Relative: 1 %
HCT: 29.4 % — ABNORMAL LOW (ref 36.0–46.0)
Hemoglobin: 9.8 g/dL — ABNORMAL LOW (ref 12.0–15.0)
Immature Granulocytes: 0 %
Lymphocytes Relative: 28 %
Lymphs Abs: 0.8 10*3/uL (ref 0.7–4.0)
MCH: 31.1 pg (ref 26.0–34.0)
MCHC: 33.3 g/dL (ref 30.0–36.0)
MCV: 93.3 fL (ref 80.0–100.0)
Monocytes Absolute: 0.6 10*3/uL (ref 0.1–1.0)
Monocytes Relative: 20 %
Neutro Abs: 1.5 10*3/uL — ABNORMAL LOW (ref 1.7–7.7)
Neutrophils Relative %: 51 %
Platelets: 230 10*3/uL (ref 150–400)
RBC: 3.15 MIL/uL — ABNORMAL LOW (ref 3.87–5.11)
RDW: 15.6 % — ABNORMAL HIGH (ref 11.5–15.5)
WBC: 3 10*3/uL — ABNORMAL LOW (ref 4.0–10.5)
nRBC: 0 % (ref 0.0–0.2)

## 2020-07-27 MED ORDER — SODIUM CHLORIDE 0.9% FLUSH
10.0000 mL | INTRAVENOUS | Status: DC | PRN
  Administered 2020-07-27: 10 mL
  Filled 2020-07-27: qty 10

## 2020-07-27 MED ORDER — PALONOSETRON HCL INJECTION 0.25 MG/5ML
0.2500 mg | Freq: Once | INTRAVENOUS | Status: AC
  Administered 2020-07-27: 0.25 mg via INTRAVENOUS

## 2020-07-27 MED ORDER — HEPARIN SOD (PORK) LOCK FLUSH 100 UNIT/ML IV SOLN
500.0000 [IU] | Freq: Once | INTRAVENOUS | Status: AC | PRN
  Administered 2020-07-27: 500 [IU]
  Filled 2020-07-27: qty 5

## 2020-07-27 MED ORDER — SODIUM CHLORIDE 0.9 % IV SOLN
10.0000 mg | Freq: Once | INTRAVENOUS | Status: AC
  Administered 2020-07-27: 10 mg via INTRAVENOUS
  Filled 2020-07-27: qty 10

## 2020-07-27 MED ORDER — DOXORUBICIN HCL LIPOSOMAL CHEMO INJECTION 2 MG/ML
29.0000 mg/m2 | Freq: Once | INTRAVENOUS | Status: AC
  Administered 2020-07-27: 50 mg via INTRAVENOUS
  Filled 2020-07-27: qty 25

## 2020-07-27 MED ORDER — INFLUENZA VAC A&B SA ADJ QUAD 0.5 ML IM PRSY
0.5000 mL | PREFILLED_SYRINGE | Freq: Once | INTRAMUSCULAR | Status: AC
  Administered 2020-07-27: 0.5 mL via INTRAMUSCULAR

## 2020-07-27 MED ORDER — DIPHENHYDRAMINE HCL 50 MG/ML IJ SOLN
INTRAMUSCULAR | Status: AC
  Filled 2020-07-27: qty 1

## 2020-07-27 MED ORDER — SODIUM CHLORIDE 0.9 % IV SOLN
371.0000 mg | Freq: Once | INTRAVENOUS | Status: AC
  Administered 2020-07-27: 370 mg via INTRAVENOUS
  Filled 2020-07-27: qty 37

## 2020-07-27 MED ORDER — SODIUM CHLORIDE 0.9 % IV SOLN
150.0000 mg | Freq: Once | INTRAVENOUS | Status: AC
  Administered 2020-07-27: 150 mg via INTRAVENOUS
  Filled 2020-07-27: qty 150

## 2020-07-27 MED ORDER — SODIUM CHLORIDE 0.9% FLUSH
10.0000 mL | Freq: Once | INTRAVENOUS | Status: AC
  Administered 2020-07-27: 10 mL
  Filled 2020-07-27: qty 10

## 2020-07-27 MED ORDER — DIPHENHYDRAMINE HCL 50 MG/ML IJ SOLN
25.0000 mg | Freq: Once | INTRAMUSCULAR | Status: AC
  Administered 2020-07-27: 25 mg via INTRAVENOUS

## 2020-07-27 MED ORDER — DEXTROSE 5 % IV SOLN
Freq: Once | INTRAVENOUS | Status: AC
  Filled 2020-07-27: qty 250

## 2020-07-27 MED ORDER — INFLUENZA VAC A&B SA ADJ QUAD 0.5 ML IM PRSY
PREFILLED_SYRINGE | INTRAMUSCULAR | Status: AC
  Filled 2020-07-27: qty 0.5

## 2020-07-27 MED ORDER — PALONOSETRON HCL INJECTION 0.25 MG/5ML
INTRAVENOUS | Status: AC
  Filled 2020-07-27: qty 5

## 2020-07-27 MED ORDER — FAMOTIDINE IN NACL 20-0.9 MG/50ML-% IV SOLN
INTRAVENOUS | Status: AC
  Filled 2020-07-27: qty 50

## 2020-07-27 MED ORDER — FAMOTIDINE IN NACL 20-0.9 MG/50ML-% IV SOLN
20.0000 mg | Freq: Once | INTRAVENOUS | Status: AC
  Administered 2020-07-27: 20 mg via INTRAVENOUS

## 2020-07-27 NOTE — Patient Instructions (Signed)
[facility] Discharge Instructions for Patients Receiving Chemotherapy  Today you received the following chemotherapy agents: Doxorubicin Liposomal and Carboplatin  To help prevent nausea and vomiting after your treatment, we encourage you to take your nausea medication as directed by your MD.   If you develop nausea and vomiting that is not controlled by your nausea medication, call the clinic.   BELOW ARE SYMPTOMS THAT SHOULD BE REPORTED IMMEDIATELY:  *FEVER GREATER THAN 100.5 F  *CHILLS WITH OR WITHOUT FEVER  NAUSEA AND VOMITING THAT IS NOT CONTROLLED WITH YOUR NAUSEA MEDICATION  *UNUSUAL SHORTNESS OF BREATH  *UNUSUAL BRUISING OR BLEEDING  TENDERNESS IN MOUTH AND THROAT WITH OR WITHOUT PRESENCE OF ULCERS  *URINARY PROBLEMS  *BOWEL PROBLEMS  UNUSUAL RASH Items with * indicate a potential emergency and should be followed up as soon as possible.  Feel free to call the clinic should you have any questions or concerns. The clinic phone number is [phone].  Please show the [facility] at check-in to the Emergency Department and triage nurse.

## 2020-07-27 NOTE — Assessment & Plan Note (Signed)
She has some rare occasional abdominal discomfort but denies nausea, bloating or changes in bowel habits We will observe for now

## 2020-07-27 NOTE — Progress Notes (Signed)
[facility] OFFICE PROGRESS NOTE  Patient Care Team: Rodrigo Ran, MD as PCP - General (Internal Medicine)  ASSESSMENT & PLAN:  Fallopian tube cancer, carcinoma (HCC) She tolerated recent chemotherapy well without major side effects She complained of intermittent sciatica type pain which may or may not be related to her disease I recommend we proceed with treatment as scheduled I plan to order CT imaging in 2 weeks for objective assessment of response to therapy Her recent tumor marker was stable/improved  Abdominal carcinomatosis (HCC) She has some rare occasional abdominal discomfort but denies nausea, bloating or changes in bowel habits We will observe for now   Pancytopenia, acquired [facility]) She has mild pancytopenia due to side effects of treatment She is not symptomatic Observe from now  Weight loss, non-intentional She is eating normal but is noticed to have some mild progressive weight loss When I recalculated her chemotherapy, it appears that dose adjustment is required We will observe this closely As above, I plan to order CT imaging for objective assessment of response to therapy to make sure that she is still responding to her treatment   Orders Placed This Encounter  Procedures  . CT ABDOMEN PELVIS W CONTRAST    Standing Status:   Future    Standing Expiration Date:   07/27/2021    Order Specific Question:   If indicated for the ordered procedure, I authorize the administration of contrast media per Radiology protocol    Answer:   Yes    Order Specific Question:   Preferred imaging location?    Answer:   [facility]    Order Specific Question:   Radiology Contrast Protocol - do NOT remove file path    Answer:   [email or web address]    All questions were answered. The patient knows to call the clinic with any problems, questions or concerns. The total time spent in the appointment was 30 minutes  encounter with patients including review of chart and various tests results, discussions about plan of care and coordination of care plan   Artis Delay, MD 07/27/2020 9:35 AM  INTERVAL HISTORY: Please see below for problem oriented charting. 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 Extensive omental nodularity highly worrisome for peritoneal carcinomatosis. Late recurrence of melanoma can present as peritoneal carcinomatosis. Ovarian cancer more commonly presents in this manner. Patient does have a predominately low density right adnexal lesion measuring up to 3.5 cm, although this lesion is not typical for ovarian cancer. 2. No evidence of bowel or ureteral obstruction. 3. No other definite signs of metastatic disease. There are small indeterminate low-density hepatic lesions.   02/08/2016 Tumor Marker   Patient's tumor was tested for the following  markers: CA-125 Results of the tumor marker test revealed 656.2   02/15/2016 Procedure   CT-guided core biopsy performed of omental mass just deep to the abdominal wall.   02/15/2016 Pathology Results   Omentum, biopsy, left - PAPILLARY SEROUS NEOPLASM,  SEE COMMENT. Microscopic Comment There are papillary collections of largely low grade appearing cells with numerous psammoma bodies. Given the limited material it is difficult to distinguish between invasive implants of a serous borderline tumor and serous carcinoma, especially given the low grade appearance.   02/18/2016 Pathology Results   1. Omentum, resection for tumor INVASIVE IMPLANT OF HIGH GRADE SEROUS CARCINOMA 2. Uterus +/- tubes/ovaries, neoplastic HIGH GRADE SEROUS CARCINOMA INVOLVING LEFT TUBAL FIMBRIA SEROUS CARCINOMA WITH PREDOMINANT PSAMMOMA BODIES IMPLANT AT UTERUS SEROSA, BILATERAL FALLOPIAN TUBAL SEROSA, AND ANTERIOR PERITONEAL REFLECTION CERVIX: HISTOLOGICAL UNREMARKABLE ENDOMETRIUM: INACTIVE ENDOMETRIUM MYOMETRIUM: LEIOMYOMA LEFT OVARY: CYSTADENOFIBROMA RIGHT OVARY AND FALLOPIAN TUBE: HISTOLOGICAL UNREMARKABLE Microscopic Comment 2. ONCOLOGY TABLE - FALLOPIAN TUBE 1. Specimen, including laterality: Omentum, uterus, bilateral ovaries and fallopian tubes 2. Procedure: Hysterectomy, bilateral salpingo-oophorectomy and tumor debulking omentectomy 3. Lymph node sampling performed: No 4. Tumor site: uterus serosa, bilateral fallopian tubal serosa, peritoneal and omentum 5. Tumor location in fallopian tube: Left fallopian tubal fimbria 6. Specimen integrity (intact/ruptured/disrupted): Intact 7. Tumor size (cm): multi focal invasive omentum implant greater than 2 cm, left fallopian tube tumor 0.8 cm. 8. Histologic type: Serous carcinoma 9. Grade: 3 10. Microscopic tumor extension: uterus serosa, bilateral fallopian tubal serosa, peritoneal and omentum 11. Margins: NA 12. Lymph-Vascular invasion: identified 13. Lymph nodes: # examined: 0; # positive: NA 14. TNM: pT3c, pNx 15. FIGO Stage (based on pathologic findings, needs clinical correlation: IIIC 16. Comment: High grade serous carcinoma multifocally and extensively involves left fallopian tubal fimbria, omentum, uterus  serosa, bilateral fallopian tubal serosa and peritoneal. At the left fallopian tube there are small foci of serous tubal intraepithelial carcinoma identified, so we conclude the carcinoma is fallopian tube origin   03/09/2016 Tumor Marker   Patient's tumor was tested for the following markers: CA-125 Results of the tumor marker test revealed 154.4   03/15/2016 Procedure   Technically successful right IJ power-injectable port catheter placement. Ready for routine use.   03/18/2016 - 07/13/2016 Chemotherapy   The patient had 6 cycles of carboplatin and taxol   04/14/2016 Tumor Marker   Patient's tumor was tested for the following markers: CA-125 Results of the tumor marker test revealed 36.7   04/25/2016 Genetic Testing   Patient has genetic testing done for germline mutation Results revealed patient has no mutation   04/28/2016 Tumor Marker   Patient's tumor was tested for the following markers: CA-125 Results of the tumor marker test revealed 24.6   06/21/2016 Tumor Marker   Patient's tumor was tested for the following markers: CA-125 Results of the tumor marker test revealed 16.6   08/01/2016 Tumor Marker   Patient's tumor was tested for the following markers: CA-125 Results of the tumor marker test revealed 17.2   08/05/2016 Imaging   Interval TAH-BSO. No evidence of residual pelvic mass or metastatic disease within the abdomen or pelvis. No other acute findings.    11/04/2016 Tumor Marker   Patient's tumor was tested for the following markers: CA-125 Results of the tumor marker test revealed 13.6   01/20/2017 Tumor Marker   Patient's tumor was tested for the following markers: CA-125 Results of the tumor marker test revealed 13.8   04/14/2017 Tumor Marker   Patient's tumor was tested for the  following markers: CA-125 Results of the tumor marker test revealed 16.1   06/13/2017 Imaging   No acute findings.  No mass or hernia identified.  Colonic diverticulosis, without  radiographic evidence of diverticulitis.  Aortic atherosclerosis.   07/03/2017 Tumor Marker   Patient's tumor was tested for the following markers: CA-125 Results of the tumor marker test revealed 18.4   09/19/2017 Tumor Marker   Patient's tumor was tested for the following markers: CA-125 Results of the tumor marker test revealed 18.5   01/23/2018 Tumor Marker   Patient's tumor was tested for the following markers: CA-125 Results of the tumor marker test revealed 35.4   01/30/2018 Imaging   1. No evidence of local fallopian tube carcinoma recurrence within the pelvis. Post hysterectomy. 2. No evidence of metastatic peritoneal disease or omental disease. No solid organ metastasis.    03/20/2018 Tumor Marker   Patient's tumor was tested for the following markers: CA-125 Results of the tumor marker test revealed 73   05/02/2018 Tumor Marker   Patient's tumor was tested for the following markers: CA-125 Results of the tumor marker test revealed 127.1   05/08/2018 Imaging   CT abdomen and pelvis 1. 7 cm left subdiaphragmatic collection of loculated fluid versus low-density soft tissue. Given the patient's history of rising CA 125, metastatic disease considered highly likely.  2. Areas of fluid or recurrent disease identified in the right lower quadrant adjacent to the cecum and along right lower quadrant small bowel loops.   05/14/2018 Tumor Marker   Patient's tumor was tested for the following markers: CA-125 Results of the tumor marker test revealed 166    Genetic Testing   Patient has genetic testing done for ER/PR and MMR. Results revealed patient has the following on 02/18/2016 surgical pathology: ER 20%, PR 0% MMR: normal    Genetic Testing   Patient has genetic testing done for MSI. Results revealed patient has the following: MSI stable   05/18/2018 - 12/31/2018 Chemotherapy   The patient had carboplatin and Doxil   07/16/2018 Tumor Marker   Patient's tumor was tested for  the following markers: CA-125 Results of the tumor marker test revealed 28.7   08/10/2018 Imaging   CT imaging:  Decreased size of peritoneal soft tissue masses in left upper quadrant, consistent with decreased metastatic disease.  No new or progressive metastatic disease. No other acute findings.  Colonic diverticulosis, without radiographic evidence of diverticulitis.  Stable small paraumbilical ventral hernia containing transverse colon.   08/13/2018 Tumor Marker   Patient's tumor was tested for the following markers: CA-125 Results of the tumor marker test revealed 21.6   08/24/2018 Echocardiogram   LV EF: 60% -  65%   10/15/2018 Tumor Marker   Patient's tumor was tested for the following markers: CA-125 Results of the tumor marker test revealed 20.1   10/19/2018 Imaging   Ct scan of abdomen and pelvis Status post hysterectomy, bilateral salpingo-oophorectomy, and omentectomy.  Two peritoneal implants in the left upper abdomen are stable versus mildly decreased, as above. No abdominopelvic ascites.  No evidence of new/progressive metastatic disease.     11/19/2018 Tumor Marker   Patient's tumor was tested for the following markers: CA-125 Results of the tumor marker test revealed 21.7   11/23/2018 Echocardiogram   1. The left ventricle has normal systolic function of 53-61%. The cavity size was normal. There is no increased left ventricular wall thickness. Echo evidence of impaired diastolic relaxation.  2. The right ventricle has normal systolic function.  The cavity was normal. There is no increase in right ventricular wall thickness.  3. The mitral valve is normal in structure. There is mild mitral annular calcification present.  4. The tricuspid valve is normal in structure.  5. The aortic valve is tricuspid There is mild thickening of the aortic valve.  6. The pulmonic valve was normal in structure. Pulmonic valve regurgitation is mild by color flow Doppler.  7.  Normal LV systolic function; mild diastolic dysfunction.   12/31/2018 Tumor Marker   Patient's tumor was tested for the following markers: CA-125 Results of the tumor marker test revealed 17.1   01/31/2019 Imaging   1. Left upper quadrant peritoneal implants described previously have decreased in the interval. No new or progressive findings in the abdomen/pelvis today. No free fluid. 2. Right paraumbilical ventral hernia contains a small knuckle of transverse colon without complicating features. 3.  Aortic Atherosclerois (ICD10-170.0)  Aortic Atherosclerosis (ICD10-I70.0).   01/31/2019 Tumor Marker   Patient's tumor was tested for the following markers: CA-125 Results of the tumor marker test revealed 18.9   02/11/2019 - 03/13/2019 Chemotherapy   The patient is taking Niraparib. She self discontinued after 1 month   02/25/2019 Tumor Marker   Patient's tumor was tested for the following markers: CA-125 Results of the tumor marker test revealed 18.6   05/13/2019 Tumor Marker   Patient's tumor was tested for the following markers: CA-125 Results of the tumor marker test revealed 230   05/22/2019 Imaging   1. Increased size of 4.6 cm soft tissue mass in the gastrosplenic ligament, consistent with metastatic disease. 2. No other sites of metastatic disease identified within the abdomen or pelvis. 3. Colonic diverticulosis. No radiographic evidence of diverticulitis. 4. Stable small right paraumbilical hernia containing transverse colon.   Aortic Atherosclerosis (ICD10-I70.0).   05/31/2019 - 08/22/2019 Chemotherapy   The patient had bevacizumab and Taxol for chemotherapy treatment.     05/31/2019 Tumor Marker   Patient's tumor was tested for the following markers: CA-125 Results of the tumor marker test revealed 84   06/21/2019 Tumor Marker   Patient's tumor was tested for the following markers: CA-125 Results of the tumor marker test revealed 49.4.   07/19/2019 Tumor Marker   Patient's  tumor was tested for the following markers: CA-125. Results of the tumor marker test revealed 44.5   08/22/2019 Imaging   1. Increase in size of 5.0 cm mass in the gastro splenic ligament consistent with metastatic disease. 2. No additional sites of disease identified within the abdomen or pelvis. 3. Unchanged paraumbilical hernia containing nonobstructed loop of large bowel. 4.  Aortic Atherosclerosis (ICD10-I70.0).     08/30/2019 Tumor Marker   Patient's tumor was tested for the following markers: CA-125 Results of the tumor marker test revealed 71.5   08/30/2019 - 05/11/2020 Chemotherapy   The patient had gemzar for chemotherapy treatment.     09/27/2019 Tumor Marker   Patient's tumor was tested for the following markers: CA-125. Results of the tumor marker test revealed 38.5   10/14/2019 Tumor Marker   Patient's tumor was tested for the following markers: CA-125 Results of the tumor marker test revealed 40.8   10/21/2019 Tumor Marker   Patient's tumor was tested for the following markers: CA-125 Results of the tumor marker test revealed 34.8.   11/22/2019 Imaging   1. Interval decrease in size of left upper quadrant cystic and solid peritoneal lesion. 2. No new sites of disease. 3. Unchanged periumbilical hernia containing a nonobstructed loop  of small bowel. 4.  Aortic Atherosclerosis (ICD10-I70.0).     11/25/2019 Tumor Marker   Patient's tumor was tested for the following markers: CA-125 Results of the tumor marker test revealed 22.7   12/23/2019 Tumor Marker   Patient's tumor was tested for the following markers: CA-125 Results of the tumor marker test revealed 26.3   01/20/2020 Tumor Marker   Patient's tumor was tested for the following markers: CA-125 Results of the tumor marker test revealed 28.3   02/14/2020 Imaging   1. 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 2. No new or progressive disease identified within the abdomen  or pelvis. 3. Colonic diverticulosis. No radiographic evidence of diverticulitis. 4. Small paraumbilical ventral hernia.     02/17/2020 Tumor Marker   Patient's tumor was tested for the following markers: CA-125 Results of the tumor marker test revealed 40.1   05/11/2020 Tumor Marker   Patient's tumor was tested for the following markers: CA-125 Results of the tumor marker test revealed 113.   05/22/2020 Imaging   1. 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 2. No evidence of ascites, bowel or ureteral obstruction. 3. 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 No evidence of incarceration or obstruction. 4. Aortic Atherosclerosis (ICD10-I70.0).   05/22/2020 Tumor Marker   Patient's tumor was tested for the following markers: CA-125 Results of the tumor marker test revealed 118   05/29/2020 Echocardiogram   1. Normal GLS -19.8. Left ventricular ejection fraction, by estimation, is 55 to 60%. The left ventricle has normal function. The left ventricle has no regional wall motion abnormalities. Left ventricular diastolic parameters were normal.  2. Right ventricular systolic function is normal. The right ventricular size is normal.  3. The mitral valve is degenerative. Trivial mitral valve regurgitation. No evidence of mitral stenosis.  4. The aortic valve is tricuspid. Aortic valve regurgitation is not visualized. Mild to moderate aortic valve sclerosis/calcification is present, without any evidence of aortic stenosis.  5. The inferior vena cava is normal in size with greater than 50% respiratory variability, suggesting right atrial pressure of 3 mmHg.   06/01/2020 -  Chemotherapy   The patient had carboplatin and doxil for chemotherapy treatment.     06/01/2020 Tumor Marker   Patient's tumor was tested for the following markers:  CA-125 Results of the tumor marker test revealed 126.   06/29/2020 Tumor Marker   Patient's tumor was tested for the following markers: CA-125. Results of the tumor marker test revealed 108     REVIEW OF SYSTEMS:   Constitutional: Denies fevers, chills  Eyes: Denies blurriness of vision Ears, nose, mouth, throat, and face: Denies mucositis or sore throat Respiratory: Denies cough, dyspnea or wheezes Cardiovascular: Denies palpitation, chest discomfort or lower extremity swelling Gastrointestinal:  Denies nausea, heartburn or change in bowel habits Skin: Denies abnormal skin rashes Lymphatics: Denies new lymphadenopathy or easy bruising Neurological:Denies numbness, tingling or new weaknesses Behavioral/Psych: Mood is stable, no new changes  All other systems were reviewed with the patient and are negative.  I have reviewed the past medical history, past surgical history, social history and family history with the patient and they are unchanged from previous note.  ALLERGIES:  has No Known Allergies.  MEDICATIONS:  Current Outpatient Medications  Medication Sig Dispense Refill  . Cholecalciferol (VITAMIN D) 2000 units CAPS Take 1 capsule by mouth  daily.    . ezetimibe (ZETIA) 10 MG tablet Take 10 mg by mouth daily.    Marland Kitchen lidocaine-prilocaine (EMLA) cream Apply to affected area once 30 g 3  . morphine (MSIR) 15 MG tablet Take 1 tablet (15 mg total) by mouth every 4 (four) hours as needed for severe pain. 30 tablet 0  . ondansetron (ZOFRAN) 8 MG tablet Take 1 tablet (8 mg total) by mouth 2 (two) times daily as needed (Nausea or vomiting). 30 tablet 1  . prochlorperazine (COMPAZINE) 10 MG tablet Take 1 tablet (10 mg total) by mouth every 6 (six) hours as needed (Nausea or vomiting). 60 tablet 1   No current facility-administered medications for this visit.    PHYSICAL EXAMINATION: ECOG PERFORMANCE STATUS: 1 - Symptomatic but completely ambulatory  Vitals:   07/27/20 0833  BP:  (!) 148/58  Pulse: 73  Resp: 18  Temp: 98 F (36.7 C)  SpO2: 100%   Filed Weights   07/27/20 0833  Weight: 148 lb 3.2 oz (67.2 kg)    GENERAL:alert, no distress and comfortable SKIN: skin color, texture, turgor are normal, no rashes or significant lesions EYES: normal, Conjunctiva are pink and non-injected, sclera clear OROPHARYNX:no exudate, no erythema and lips, buccal mucosa, and tongue normal  NECK: supple, thyroid normal size, non-tender, without nodularity LYMPH:  no palpable lymphadenopathy in the cervical, axillary or inguinal LUNGS: clear to auscultation and percussion with normal breathing effort HEART: regular rate & rhythm and no murmurs and no lower extremity edema ABDOMEN:abdomen soft, non-tender and normal bowel sounds Musculoskeletal:no cyanosis of digits and no clubbing  NEURO: alert & oriented x 3 with fluent speech, no focal motor/sensory deficits  LABORATORY DATA:  I have reviewed the data as listed    Component Value Date/Time   NA 137 07/27/2020 0820   NA 139 06/07/2017 1430   K 4.5 07/27/2020 0820   K 3.8 06/07/2017 1430   CL 104 07/27/2020 0820   CO2 26 07/27/2020 0820   CO2 27 06/07/2017 1430   GLUCOSE 86 07/27/2020 0820   GLUCOSE 124 06/07/2017 1430   BUN 15 07/27/2020 0820   BUN 14.0 06/07/2017 1430   CREATININE 0.81 07/27/2020 0820   CREATININE 0.78 01/20/2020 1029   CREATININE 0.8 06/07/2017 1430   CALCIUM 9.2 07/27/2020 0820   CALCIUM 9.3 06/07/2017 1430   PROT 6.4 (L) 07/27/2020 0820   PROT 6.6 09/30/2016 1027   ALBUMIN 3.3 (L) 07/27/2020 0820   ALBUMIN 3.5 09/30/2016 1027   AST 14 (L) 07/27/2020 0820   AST 20 01/20/2020 1029   AST 18 09/30/2016 1027   ALT 15 07/27/2020 0820   ALT 28 01/20/2020 1029   ALT 21 09/30/2016 1027   ALKPHOS 86 07/27/2020 0820   ALKPHOS 77 09/30/2016 1027   BILITOT 0.3 07/27/2020 0820   BILITOT 0.2 (L) 01/20/2020 1029   BILITOT 0.37 09/30/2016 1027   GFRNONAA >60 07/27/2020 0820   GFRNONAA >60  01/20/2020 1029   GFRAA >60 06/29/2020 0913   GFRAA >60 01/20/2020 1029    No results found for: SPEP, UPEP  Lab Results  Component Value Date   WBC 3.0 (L) 07/27/2020   NEUTROABS 1.5 (L) 07/27/2020   HGB 9.8 (L) 07/27/2020   HCT 29.4 (L) 07/27/2020   MCV 93.3 07/27/2020   PLT 230 07/27/2020      Chemistry      Component Value Date/Time   NA 137 07/27/2020 0820   NA 139 06/07/2017 1430   K 4.5  07/27/2020 0820   K 3.8 06/07/2017 1430   CL 104 07/27/2020 0820   CO2 26 07/27/2020 0820   CO2 27 06/07/2017 1430   BUN 15 07/27/2020 0820   BUN 14.0 06/07/2017 1430   CREATININE 0.81 07/27/2020 0820   CREATININE 0.78 01/20/2020 1029   CREATININE 0.8 06/07/2017 1430      Component Value Date/Time   CALCIUM 9.2 07/27/2020 0820   CALCIUM 9.3 06/07/2017 1430   ALKPHOS 86 07/27/2020 0820   ALKPHOS 77 09/30/2016 1027   AST 14 (L) 07/27/2020 0820   AST 20 01/20/2020 1029   AST 18 09/30/2016 1027   ALT 15 07/27/2020 0820   ALT 28 01/20/2020 1029   ALT 21 09/30/2016 1027   BILITOT 0.3 07/27/2020 0820   BILITOT 0.2 (L) 01/20/2020 1029   BILITOT 0.37 09/30/2016 1027      the radiological images as listed and agreed with the findings in the report. No results found.

## 2020-07-27 NOTE — Assessment & Plan Note (Signed)
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

## 2020-07-27 NOTE — Assessment & Plan Note (Signed)
She tolerated recent chemotherapy well without major side effects She complained of intermittent sciatica type pain which may or may not be related to her disease I recommend we proceed with treatment as scheduled I plan to order CT imaging in 2 weeks for objective assessment of response to therapy Her recent tumor marker was stable/improved

## 2020-07-27 NOTE — Assessment & Plan Note (Signed)
She is eating normal but is noticed to have some mild progressive weight loss When I recalculated her chemotherapy, it appears that dose adjustment is required We will observe this closely As above, I plan to order CT imaging for objective assessment of response to therapy to make sure that she is still responding to her treatment

## 2020-07-28 ENCOUNTER — Telehealth: Payer: Self-pay | Admitting: Oncology

## 2020-07-28 ENCOUNTER — Telehealth: Payer: Self-pay

## 2020-07-28 LAB — CA 125: Cancer Antigen (CA) 125: 89.1 U/mL — ABNORMAL HIGH (ref 0.0–38.1)

## 2020-07-28 NOTE — Telephone Encounter (Signed)
Effa called and was advised of her CA 125 results.

## 2020-07-28 NOTE — Telephone Encounter (Signed)
-----   Message from [name], MD sent at 07/28/2020  8:01 AM EDT ----- Regarding: pls let her know CA-125 is better

## 2020-07-28 NOTE — Telephone Encounter (Signed)
Called and left below message. Ask her to call the office for questions. ?

## 2020-08-05 DIAGNOSIS — I10 Essential (primary) hypertension: Secondary | ICD-10-CM | POA: Diagnosis not present

## 2020-08-05 DIAGNOSIS — E785 Hyperlipidemia, unspecified: Secondary | ICD-10-CM | POA: Diagnosis not present

## 2020-08-05 DIAGNOSIS — E119 Type 2 diabetes mellitus without complications: Secondary | ICD-10-CM | POA: Diagnosis not present

## 2020-08-10 ENCOUNTER — Telehealth: Payer: Self-pay | Admitting: Oncology

## 2020-08-10 NOTE — Telephone Encounter (Signed)
My plan is to assess with CT first before scheduling more chemo I can schedule Rx after I see her

## 2020-08-10 NOTE — Telephone Encounter (Signed)
Left a voice mail for [facility] with message below from Dr. Alvy Bimler.

## 2020-08-10 NOTE — Telephone Encounter (Signed)
Mal Amabile regarding scheduling her CT appointment for this Thursday.  She said scheduling had called her on Saturday and left a message.  She tried to call back and was given an error message.  She is going to call this morning to schedule.  Advised her that we may need to reschedule Dr. Calton Dach apt on Friday depending on when the CT is scheduled and that we will call her back.

## 2020-08-10 NOTE — Telephone Encounter (Signed)
Called Jessica Johnston back and advised her that appointment with Dr. Alvy Bimler has been rescheduled to 08/19/20 at 11:45.  She verbalized agreement and asked if she is supposed to have a chemotherapy appointment on 08/24/20.  Advised her that it is not scheduled but that I will double check with Dr. Alvy Bimler and call her back.

## 2020-08-14 ENCOUNTER — Ambulatory Visit: Payer: Medicare Other | Admitting: Hematology and Oncology

## 2020-08-17 ENCOUNTER — Telehealth: Payer: Self-pay | Admitting: Oncology

## 2020-08-17 NOTE — Telephone Encounter (Signed)
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  Advised her that the CT will show her lower spine and hip bones/pelvis.  She said that her friend is recommending that she see a chiropractor for the sciatica.  Advised her to to discuss this with Dr. Alvy Bimler at her appointment on Wednesday.

## 2020-08-18 ENCOUNTER — Other Ambulatory Visit: Payer: Self-pay

## 2020-08-18 ENCOUNTER — Ambulatory Visit (HOSPITAL_COMMUNITY)
Admission: RE | Admit: 2020-08-18 | Discharge: 2020-08-18 | Disposition: A | Discharge status: Home / Self Care | Payer: Medicare Other | Source: Ambulatory Visit | Attending: Hematology and Oncology | Admitting: Hematology and Oncology

## 2020-08-18 DIAGNOSIS — C5700 Malignant neoplasm of unspecified fallopian tube: Secondary | ICD-10-CM | POA: Insufficient documentation

## 2020-08-18 DIAGNOSIS — C762 Malignant neoplasm of abdomen: Secondary | ICD-10-CM | POA: Insufficient documentation

## 2020-08-18 DIAGNOSIS — C787 Secondary malignant neoplasm of liver and intrahepatic bile duct: Secondary | ICD-10-CM | POA: Diagnosis not present

## 2020-08-18 DIAGNOSIS — K449 Diaphragmatic hernia without obstruction or gangrene: Secondary | ICD-10-CM | POA: Diagnosis not present

## 2020-08-18 DIAGNOSIS — K5730 Diverticulosis of large intestine without perforation or abscess without bleeding: Secondary | ICD-10-CM | POA: Diagnosis not present

## 2020-08-18 MED ORDER — IOHEXOL 300 MG/ML  SOLN
100.0000 mL | Freq: Once | INTRAMUSCULAR | Status: AC | PRN
  Administered 2020-08-18: 100 mL via INTRAVENOUS

## 2020-08-19 ENCOUNTER — Inpatient Hospital Stay: Payer: Medicare Other | Attending: Gynecologic Oncology | Admitting: Hematology and Oncology

## 2020-08-19 ENCOUNTER — Telehealth: Payer: Self-pay

## 2020-08-19 ENCOUNTER — Other Ambulatory Visit: Payer: Self-pay

## 2020-08-19 ENCOUNTER — Encounter: Payer: Self-pay | Admitting: Hematology and Oncology

## 2020-08-19 VITALS — BP 156/64 | HR 97 | Temp 97.8°F | Resp 20 | Ht 64.0 in | Wt 142.0 lb

## 2020-08-19 DIAGNOSIS — C5702 Malignant neoplasm of left fallopian tube: Secondary | ICD-10-CM | POA: Insufficient documentation

## 2020-08-19 DIAGNOSIS — D61818 Other pancytopenia: Secondary | ICD-10-CM

## 2020-08-19 DIAGNOSIS — R634 Abnormal weight loss: Secondary | ICD-10-CM

## 2020-08-19 DIAGNOSIS — C5700 Malignant neoplasm of unspecified fallopian tube: Secondary | ICD-10-CM | POA: Diagnosis not present

## 2020-08-19 DIAGNOSIS — Z5111 Encounter for antineoplastic chemotherapy: Secondary | ICD-10-CM | POA: Insufficient documentation

## 2020-08-19 DIAGNOSIS — R0600 Dyspnea, unspecified: Secondary | ICD-10-CM | POA: Diagnosis not present

## 2020-08-19 DIAGNOSIS — M5431 Sciatica, right side: Secondary | ICD-10-CM | POA: Insufficient documentation

## 2020-08-19 NOTE — Progress Notes (Signed)
[facility] OFFICE PROGRESS NOTE  Patient Care Team: [name], MD as PCP - General (Internal Medicine)  ASSESSMENT & PLAN:  Fallopian tube cancer, carcinoma ([place]) I have reviewed her recent blood work and tumor markers She had positive response to therapy I recommend continue chemotherapy for few more cycles and repeat imaging study after cycle 6 I will order repeat echocardiogram to monitor her cardiac function She has no clinical signs or symptoms of congestive heart failure  Pancytopenia, acquired ([place]) She has significant progressive pancytopenia with last therapy Since we have good response to treatment, I plan to reduce the dose of AUC of 4.5 for carboplatin and to adjust the dose of chemotherapy based on recent current weight  Weight loss, non-intentional She has small progressive weight loss due to dietary modification I will adjust the dose of her chemotherapy today  Right sided sciatica I did not see anything in her imaging study that would explain her right-sided sciatica I will refer her to physical medicine for evaluation In the meantime, I advised her to take some over-the-counter analgesics for pain relief   Orders Placed This Encounter  Procedures  . Ambulatory referral to Physical Medicine Rehab    Referral Priority:   Routine    Referral Type:   Rehabilitation    Referral Reason:   Specialty Services Required    Requested Specialty:   Physical Medicine and Rehabilitation    Number of Visits Requested:   1  . ECHOCARDIOGRAM COMPLETE    Standing Status:   Future    Standing Expiration Date:   08/19/2021    Order Specific Question:   Where should this test be performed    Answer:   [place]    Order Specific Question:   Perflutren DEFINITY (image enhancing agent) should be administered unless hypersensitivity or allergy exist    Answer:   Administer Perflutren    Order Specific Question:   Reason for exam-Echo    Answer:   Chemotherapy  evaluation  v87.41 / v58.11    All questions were answered. The patient knows to call the clinic with any problems, questions or concerns. The total time spent in the appointment was 40 minutes encounter with patients including review of chart and various tests results, discussions about plan of care and coordination of care plan   [name], MD 08/19/2020 12:33 PM  INTERVAL HISTORY: Please see below for problem oriented charting. 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 Extensive omental nodularity highly worrisome for peritoneal carcinomatosis. Late recurrence of melanoma can present as peritoneal carcinomatosis. Ovarian cancer more commonly presents in this manner. Patient does have a predominately low density right adnexal lesion measuring up to 3.5 cm, although this lesion is not typical for ovarian cancer. 2. No evidence of bowel or ureteral obstruction.  3. No other definite signs of metastatic disease. There are small indeterminate low-density hepatic lesions.   02/08/2016 Tumor Marker   Patient's tumor was tested for the following markers: CA-125 Results of the tumor marker test revealed 656.2   02/15/2016 Procedure   CT-guided core biopsy performed of omental mass just deep to  the abdominal wall.   02/15/2016 Pathology Results   Omentum, biopsy, left - PAPILLARY SEROUS NEOPLASM, SEE COMMENT. Microscopic Comment There are papillary collections of largely low grade appearing cells with numerous psammoma bodies. Given the limited material it is difficult to distinguish between invasive implants of a serous borderline tumor and serous carcinoma, especially given the low grade appearance.   02/18/2016 Pathology Results   1. Omentum, resection for tumor INVASIVE IMPLANT OF HIGH GRADE SEROUS CARCINOMA 2. Uterus +/- tubes/ovaries, neoplastic HIGH GRADE SEROUS CARCINOMA INVOLVING LEFT TUBAL FIMBRIA SEROUS CARCINOMA WITH PREDOMINANT PSAMMOMA BODIES IMPLANT AT UTERUS SEROSA, BILATERAL FALLOPIAN TUBAL SEROSA, AND ANTERIOR PERITONEAL REFLECTION CERVIX: HISTOLOGICAL UNREMARKABLE ENDOMETRIUM: INACTIVE ENDOMETRIUM MYOMETRIUM: LEIOMYOMA LEFT OVARY: CYSTADENOFIBROMA RIGHT OVARY AND FALLOPIAN TUBE: HISTOLOGICAL UNREMARKABLE Microscopic Comment 2. ONCOLOGY TABLE - FALLOPIAN TUBE 1. Specimen, including laterality: Omentum, uterus, bilateral ovaries and fallopian tubes 2. Procedure: Hysterectomy, bilateral salpingo-oophorectomy and tumor debulking omentectomy 3. Lymph node sampling performed: No 4. Tumor site: uterus serosa, bilateral fallopian tubal serosa, peritoneal and omentum 5. Tumor location in fallopian tube: Left fallopian tubal fimbria 6. Specimen integrity (intact/ruptured/disrupted): Intact 7. Tumor size (cm): multi focal invasive omentum implant greater than 2 cm, left fallopian tube tumor 0.8 cm. 8. Histologic type: Serous carcinoma 9. Grade: 3 10. Microscopic tumor extension: uterus serosa, bilateral fallopian tubal serosa, peritoneal and omentum 11. Margins: NA 12. Lymph-Vascular invasion: identified 13. Lymph nodes: # examined: 0; # positive: NA 14. TNM: pT3c, pNx 15. FIGO Stage (based on pathologic findings, needs clinical correlation: IIIC 16. Comment: High  grade serous carcinoma multifocally and extensively involves left fallopian tubal fimbria, omentum, uterus serosa, bilateral fallopian tubal serosa and peritoneal. At the left fallopian tube there are small foci of serous tubal intraepithelial carcinoma identified, so we conclude the carcinoma is fallopian tube origin   03/09/2016 Tumor Marker   Patient's tumor was tested for the following markers: CA-125 Results of the tumor marker test revealed 154.4   03/15/2016 Procedure   Technically successful right IJ power-injectable port catheter placement. Ready for routine use.   03/18/2016 - 07/13/2016 Chemotherapy   The patient had 6 cycles of carboplatin and taxol   04/14/2016 Tumor Marker   Patient's tumor was tested for the following markers: CA-125 Results of the tumor marker test revealed 36.7   04/25/2016 Genetic Testing   Patient has genetic testing done for germline mutation Results revealed patient has no mutation   04/28/2016 Tumor Marker   Patient's tumor was tested for the following markers: CA-125 Results of the tumor marker test revealed 24.6   06/21/2016 Tumor Marker   Patient's tumor was tested for the following markers: CA-125 Results of the tumor marker test revealed 16.6   08/01/2016 Tumor Marker   Patient's tumor was tested for the following markers: CA-125 Results of the tumor marker test revealed 17.2   08/05/2016 Imaging   Interval TAH-BSO. No evidence of residual pelvic mass or metastatic disease within the abdomen or pelvis. No other acute findings.    11/04/2016 Tumor Marker   Patient's tumor was tested for the following markers: CA-125 Results of the tumor marker test revealed 13.6   01/20/2017 Tumor Marker   Patient's  tumor was tested for the following markers: CA-125 Results of the tumor marker test revealed 13.8   04/14/2017 Tumor Marker   Patient's tumor was tested for the following markers: CA-125 Results of the tumor marker test revealed 16.1    06/13/2017 Imaging   No acute findings.  No mass or hernia identified.  Colonic diverticulosis, without radiographic evidence of diverticulitis.  Aortic atherosclerosis.   07/03/2017 Tumor Marker   Patient's tumor was tested for the following markers: CA-125 Results of the tumor marker test revealed 18.4   09/19/2017 Tumor Marker   Patient's tumor was tested for the following markers: CA-125 Results of the tumor marker test revealed 18.5   01/23/2018 Tumor Marker   Patient's tumor was tested for the following markers: CA-125 Results of the tumor marker test revealed 35.4   01/30/2018 Imaging   1. No evidence of local fallopian tube carcinoma recurrence within the pelvis. Post hysterectomy. 2. No evidence of metastatic peritoneal disease or omental disease. No solid organ metastasis.    03/20/2018 Tumor Marker   Patient's tumor was tested for the following markers: CA-125 Results of the tumor marker test revealed 73   05/02/2018 Tumor Marker   Patient's tumor was tested for the following markers: CA-125 Results of the tumor marker test revealed 127.1   05/08/2018 Imaging   CT abdomen and pelvis 1. 7 cm left subdiaphragmatic collection of loculated fluid versus low-density soft tissue. Given the patient's history of rising CA 125, metastatic disease considered highly likely.  2. Areas of fluid or recurrent disease identified in the right lower quadrant adjacent to the cecum and along right lower quadrant small bowel loops.   05/14/2018 Tumor Marker   Patient's tumor was tested for the following markers: CA-125 Results of the tumor marker test revealed 166    Genetic Testing   Patient has genetic testing done for ER/PR and MMR. Results revealed patient has the following on 02/18/2016 surgical pathology: ER 20%, PR 0% MMR: normal    Genetic Testing   Patient has genetic testing done for MSI. Results revealed patient has the following: MSI stable   05/18/2018 - 12/31/2018  Chemotherapy   The patient had carboplatin and Doxil   07/16/2018 Tumor Marker   Patient's tumor was tested for the following markers: CA-125 Results of the tumor marker test revealed 28.7   08/10/2018 Imaging   CT imaging:  Decreased size of peritoneal soft tissue masses in left upper quadrant, consistent with decreased metastatic disease.  No new or progressive metastatic disease. No other acute findings.  Colonic diverticulosis, without radiographic evidence of diverticulitis.  Stable small paraumbilical ventral hernia containing transverse colon.   08/13/2018 Tumor Marker   Patient's tumor was tested for the following markers: CA-125 Results of the tumor marker test revealed 21.6   08/24/2018 Echocardiogram   LV EF: 60% -  65%   10/15/2018 Tumor Marker   Patient's tumor was tested for the following markers: CA-125 Results of the tumor marker test revealed 20.1   10/19/2018 Imaging   Ct scan of abdomen and pelvis Status post hysterectomy, bilateral salpingo-oophorectomy, and omentectomy.  Two peritoneal implants in the left upper abdomen are stable versus mildly decreased, as above. No abdominopelvic ascites.  No evidence of new/progressive metastatic disease.     11/19/2018 Tumor Marker   Patient's tumor was tested for the following markers: CA-125 Results of the tumor marker test revealed 21.7   11/23/2018 Echocardiogram   1. The left ventricle has normal systolic function of  60-65%. The cavity size was normal. There is no increased left ventricular wall thickness. Echo evidence of impaired diastolic relaxation.  2. The right ventricle has normal systolic function. The cavity was normal. There is no increase in right ventricular wall thickness.  3. The mitral valve is normal in structure. There is mild mitral annular calcification present.  4. The tricuspid valve is normal in structure.  5. The aortic valve is tricuspid There is mild thickening of the aortic valve.   6. The pulmonic valve was normal in structure. Pulmonic valve regurgitation is mild by color flow Doppler.  7. Normal LV systolic function; mild diastolic dysfunction.   12/31/2018 Tumor Marker   Patient's tumor was tested for the following markers: CA-125 Results of the tumor marker test revealed 17.1   01/31/2019 Imaging   1. Left upper quadrant peritoneal implants described previously have decreased in the interval. No new or progressive findings in the abdomen/pelvis today. No free fluid. 2. Right paraumbilical ventral hernia contains a small knuckle of transverse colon without complicating features. 3.  Aortic Atherosclerois (ICD10-170.0)  Aortic Atherosclerosis (ICD10-I70.0).   01/31/2019 Tumor Marker   Patient's tumor was tested for the following markers: CA-125 Results of the tumor marker test revealed 18.9   02/11/2019 - 03/13/2019 Chemotherapy   The patient is taking Niraparib. She self discontinued after 1 month   02/25/2019 Tumor Marker   Patient's tumor was tested for the following markers: CA-125 Results of the tumor marker test revealed 18.6   05/13/2019 Tumor Marker   Patient's tumor was tested for the following markers: CA-125 Results of the tumor marker test revealed 230   05/22/2019 Imaging   1. Increased size of 4.6 cm soft tissue mass in the gastrosplenic ligament, consistent with metastatic disease. 2. No other sites of metastatic disease identified within the abdomen or pelvis. 3. Colonic diverticulosis. No radiographic evidence of diverticulitis. 4. Stable small right paraumbilical hernia containing transverse colon.   Aortic Atherosclerosis (ICD10-I70.0).   05/31/2019 - 08/22/2019 Chemotherapy   The patient had bevacizumab and Taxol for chemotherapy treatment.     05/31/2019 Tumor Marker   Patient's tumor was tested for the following markers: CA-125 Results of the tumor marker test revealed 84   06/21/2019 Tumor Marker   Patient's tumor was tested for the  following markers: CA-125 Results of the tumor marker test revealed 49.4.   07/19/2019 Tumor Marker   Patient's tumor was tested for the following markers: CA-125. Results of the tumor marker test revealed 44.5   08/22/2019 Imaging   1. Increase in size of 5.0 cm mass in the gastro splenic ligament consistent with metastatic disease. 2. No additional sites of disease identified within the abdomen or pelvis. 3. Unchanged paraumbilical hernia containing nonobstructed loop of large bowel. 4.  Aortic Atherosclerosis (ICD10-I70.0).     08/30/2019 Tumor Marker   Patient's tumor was tested for the following markers: CA-125 Results of the tumor marker test revealed 71.5   08/30/2019 - 05/11/2020 Chemotherapy   The patient had gemzar for chemotherapy treatment.     09/27/2019 Tumor Marker   Patient's tumor was tested for the following markers: CA-125. Results of the tumor marker test revealed 38.5   10/14/2019 Tumor Marker   Patient's tumor was tested for the following markers: CA-125 Results of the tumor marker test revealed 40.8   10/21/2019 Tumor Marker   Patient's tumor was tested for the following markers: CA-125 Results of the tumor marker test revealed 34.8.   11/22/2019 Imaging  1. Interval decrease in size of left upper quadrant cystic and solid peritoneal lesion. 2. No new sites of disease. 3. Unchanged periumbilical hernia containing a nonobstructed loop of small bowel. 4.  Aortic Atherosclerosis (ICD10-I70.0).     11/25/2019 Tumor Marker   Patient's tumor was tested for the following markers: CA-125 Results of the tumor marker test revealed 22.7   12/23/2019 Tumor Marker   Patient's tumor was tested for the following markers: CA-125 Results of the tumor marker test revealed 26.3   01/20/2020 Tumor Marker   Patient's tumor was tested for the following markers: CA-125 Results of the tumor marker test revealed 28.3   02/14/2020 Imaging   1. Minimal decrease in size of 3.8 cm  peritoneal metastasis in the left subphrenic space. 2. No new or progressive disease identified within the abdomen or pelvis. 3. Colonic diverticulosis. No radiographic evidence of diverticulitis. 4. Small paraumbilical ventral hernia.     02/17/2020 Tumor Marker   Patient's tumor was tested for the following markers: CA-125 Results of the tumor marker test revealed 40.1   05/11/2020 Tumor Marker   Patient's tumor was tested for the following markers: CA-125 Results of the tumor marker test revealed 113.   05/22/2020 Imaging   1. 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 2. No evidence of ascites, bowel or ureteral obstruction. 3. 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 No evidence of incarceration or obstruction. 4. Aortic Atherosclerosis (ICD10-I70.0).   05/22/2020 Tumor Marker   Patient's tumor was tested for the following markers: CA-125 Results of the tumor marker test revealed 118   05/29/2020 Echocardiogram   1. Normal GLS -19.8. Left ventricular ejection fraction, by estimation, is 55 to 60%. The left ventricle has normal function. The left ventricle has no regional wall motion abnormalities. Left ventricular diastolic parameters were normal.  2. Right ventricular systolic function is normal. The right ventricular size is normal.  3. The mitral valve is degenerative. Trivial mitral valve regurgitation. No evidence of mitral stenosis.  4. The aortic valve is tricuspid. Aortic valve regurgitation is not visualized. Mild to moderate aortic valve sclerosis/calcification is present, without any evidence of aortic stenosis.  5. The inferior vena cava is normal in size with greater than 50% respiratory variability, suggesting right atrial pressure of 3 mmHg.   06/01/2020 -  Chemotherapy   The patient had carboplatin and doxil for  chemotherapy treatment.     06/01/2020 Tumor Marker   Patient's tumor was tested for the following markers: CA-125 Results of the tumor marker test revealed 126.   06/29/2020 Tumor Marker   Patient's tumor was tested for the following markers: CA-125. Results of the tumor marker test revealed 108   07/27/2020 Tumor Marker   Patient's tumor was tested for the following markers: CA-125. Results of the tumor marker test revealed 89.1   08/18/2020 Imaging   1. Interval positive response to therapy. 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 No new or progressive metastatic disease in the abdomen or pelvis. 2. Chronic findings include: Small hiatal hernia. Mild to moderate colonic diverticulosis. Aortic Atherosclerosis (ICD10-I70.0).     REVIEW OF SYSTEMS:   Constitutional: Denies fevers, chills  Eyes: Denies blurriness of vision Ears, nose, mouth, throat, and face: Denies mucositis or sore throat Respiratory: Denies cough, dyspnea or wheezes Cardiovascular: Denies palpitation, chest discomfort or lower extremity  swelling Gastrointestinal:  Denies nausea, heartburn or change in bowel habits Skin: Denies abnormal skin rashes Lymphatics: Denies new lymphadenopathy or easy bruising Behavioral/Psych: Mood is stable, no new changes  All other systems were reviewed with the patient and are negative.  I have reviewed the past medical history, past surgical history, social history and family history with the patient and they are unchanged from previous note.  ALLERGIES:  has No Known Allergies.  MEDICATIONS:  Current Outpatient Medications  Medication Sig Dispense Refill  . Cholecalciferol (VITAMIN D) 2000 units CAPS Take 1 capsule by mouth daily.    Marland Kitchen ezetimibe (ZETIA) 10 MG tablet Take 10 mg by mouth daily.    Marland Kitchen lidocaine-prilocaine (EMLA) cream Apply to affected area once 30 g 3  . morphine (MSIR) 15 MG tablet Take 1 tablet (15  mg total) by mouth every 4 (four) hours as needed for severe pain. 30 tablet 0  . ondansetron (ZOFRAN) 8 MG tablet Take 1 tablet (8 mg total) by mouth 2 (two) times daily as needed (Nausea or vomiting). 30 tablet 1  . prochlorperazine (COMPAZINE) 10 MG tablet Take 1 tablet (10 mg total) by mouth every 6 (six) hours as needed (Nausea or vomiting). 60 tablet 1   No current facility-administered medications for this visit.    PHYSICAL EXAMINATION: ECOG PERFORMANCE STATUS: 1 - Symptomatic but completely ambulatory  Vitals:   08/19/20 1144  BP: (!) 156/64  Pulse: 97  Resp: 20  Temp: 97.8 F (36.6 C)  SpO2: 100%   Filed Weights   08/19/20 1144  Weight: 142 lb (64.4 kg)    GENERAL:alert, no distress and comfortable Musculoskeletal:no cyanosis of digits and no clubbing  NEURO: alert & oriented x 3 with fluent speech, no focal motor/sensory deficits  LABORATORY DATA:  I have reviewed the data as listed    Component Value Date/Time   NA 137 07/27/2020 0820   NA 139 06/07/2017 1430   K 4.5 07/27/2020 0820   K 3.8 06/07/2017 1430   CL 104 07/27/2020 0820   CO2 26 07/27/2020 0820   CO2 27 06/07/2017 1430   GLUCOSE 86 07/27/2020 0820   GLUCOSE 124 06/07/2017 1430   BUN 15 07/27/2020 0820   BUN 14.0 06/07/2017 1430   CREATININE 0.81 07/27/2020 0820   CREATININE 0.78 01/20/2020 1029   CREATININE 0.8 06/07/2017 1430   CALCIUM 9.2 07/27/2020 0820   CALCIUM 9.3 06/07/2017 1430   PROT 6.4 (L) 07/27/2020 0820   PROT 6.6 09/30/2016 1027   ALBUMIN 3.3 (L) 07/27/2020 0820   ALBUMIN 3.5 09/30/2016 1027   AST 14 (L) 07/27/2020 0820   AST 20 01/20/2020 1029   AST 18 09/30/2016 1027   ALT 15 07/27/2020 0820   ALT 28 01/20/2020 1029   ALT 21 09/30/2016 1027   ALKPHOS 86 07/27/2020 0820   ALKPHOS 77 09/30/2016 1027   BILITOT 0.3 07/27/2020 0820   BILITOT 0.2 (L) 01/20/2020 1029   BILITOT 0.37 09/30/2016 1027   GFRNONAA >60 07/27/2020 0820   GFRNONAA >60 01/20/2020 1029   GFRAA >60  06/29/2020 0913   GFRAA >60 01/20/2020 1029    No results found for: SPEP, UPEP  Lab Results  Component Value Date   WBC 3.0 (L) 07/27/2020   NEUTROABS 1.5 (L) 07/27/2020   HGB 9.8 (L) 07/27/2020   HCT 29.4 (L) 07/27/2020   MCV 93.3 07/27/2020   PLT 230 07/27/2020      Chemistry      Component Value Date/Time  NA 137 07/27/2020 0820   NA 139 06/07/2017 1430   K 4.5 07/27/2020 0820   K 3.8 06/07/2017 1430   CL 104 07/27/2020 0820   CO2 26 07/27/2020 0820   CO2 27 06/07/2017 1430   BUN 15 07/27/2020 0820   BUN 14.0 06/07/2017 1430   CREATININE 0.81 07/27/2020 0820   CREATININE 0.78 01/20/2020 1029   CREATININE 0.8 06/07/2017 1430      Component Value Date/Time   CALCIUM 9.2 07/27/2020 0820   CALCIUM 9.3 06/07/2017 1430   ALKPHOS 86 07/27/2020 0820   ALKPHOS 77 09/30/2016 1027   AST 14 (L) 07/27/2020 0820   AST 20 01/20/2020 1029   AST 18 09/30/2016 1027   ALT 15 07/27/2020 0820   ALT 28 01/20/2020 1029   ALT 21 09/30/2016 1027   BILITOT 0.3 07/27/2020 0820   BILITOT 0.2 (L) 01/20/2020 1029   BILITOT 0.37 09/30/2016 1027       RADIOGRAPHIC STUDIES: I have reviewed multiple imaging studies with the patient I have personally reviewed the radiological images as listed and agreed with the findings in the report. CT ABDOMEN PELVIS W CONTRAST  Result Date: 08/18/2020 CLINICAL DATA:  Fallopian tube cancer diagnosed in 2017. Prior TH and omentectomy. Ongoing chemotherapy. Restaging. EXAM: CT ABDOMEN AND PELVIS WITH CONTRAST TECHNIQUE: Multidetector CT imaging of the abdomen and pelvis was performed using the standard protocol following bolus administration of intravenous contrast. CONTRAST:  160mL OMNIPAQUE IOHEXOL 300 MG/ML  SOLN COMPARISON:  05/22/2020 CT abdomen/pelvis. FINDINGS: Lower chest: No significant pulmonary nodules or acute consolidative airspace disease. Hepatobiliary: Liver capsular lesions are all decreased. Represent hypodense anterior liver dome 2.1 x 1.8  cm capsular lesion (series 2/image 9), previously 2.9 x 2.4 cm, decreased. Hypodense lateral segment left liver dome 1.7 x 1.1 cm capsular lesion (series 2/image 14), previously 2.8 x 2.1 cm, decreased. Hypodense anterior segment 5 liver capsular 1.6 x 0.4 cm lesion (series 2/image 19), previously 3.2 x 1.1 cm, decreased. No new liver lesions. Normal gallbladder with no radiopaque cholelithiasis. No biliary ductal dilatation. Pancreas: Normal, with no mass or duct dilation. Spleen: Normal size spleen. Clustered hypodense masses along the anterior splenic capsule, largest 3.8 x 3.7 cm (series 2/image 15), previously 4.2 x 3.8 cm, all mildly decreased. No new splenic or perisplenic masses. Adrenals/Urinary Tract: No discrete adrenal nodules. Subcentimeter hypodense lateral upper right renal cortical lesion is too small to characterize and is unchanged. No new renal lesions. Small parapelvic renal cysts in the kidneys bilaterally. No hydronephrosis. Normal bladder. Stomach/Bowel: Small hiatal hernia. Otherwise normal nondistended stomach. Normal caliber small bowel with no small bowel wall thickening. Normal appendix. Oral contrast transits to the rectum. Scattered mild to moderate colonic diverticulosis with no large bowel wall thickening or acute pericolonic fat stranding. Vascular/Lymphatic: Atherosclerotic nonaneurysmal abdominal aorta. Patent portal, splenic, hepatic and renal veins. No pathologically enlarged lymph nodes in the abdomen or pelvis. Reproductive: Hysterectomy. No discrete mass at the hysterectomy margin. No adnexal masses. Other: No pneumoperitoneum, ascites or focal fluid collection. No new peritoneal lesions. Musculoskeletal: No aggressive appearing focal osseous lesions. Moderate thoracolumbar spondylosis. IMPRESSION: 1. Interval positive response to therapy. 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 No new or progressive metastatic  disease in the abdomen or pelvis. 2. Chronic findings include: Small hiatal hernia. Mild to moderate colonic diverticulosis. Aortic Atherosclerosis (ICD10-I70.0). Electronically Signed   By: Ilona Sorrel M.D.   On: 08/18/2020 16:25

## 2020-08-19 NOTE — Assessment & Plan Note (Signed)
I did not see anything in her imaging study that would explain her right-sided sciatica I will refer her to physical medicine for evaluation In the meantime, I advised her to take some over-the-counter analgesics for pain relief

## 2020-08-19 NOTE — Assessment & Plan Note (Signed)
She has significant progressive pancytopenia with last therapy Since we have good response to treatment, I plan to reduce the dose of AUC of 4.5 for carboplatin and to adjust the dose of chemotherapy based on recent current weight

## 2020-08-19 NOTE — Assessment & Plan Note (Signed)
She has small progressive weight loss due to dietary modification I will adjust the dose of her chemotherapy today

## 2020-08-19 NOTE — Assessment & Plan Note (Signed)
I have reviewed her recent blood work and tumor markers She had positive response to therapy I recommend continue chemotherapy for few more cycles and repeat imaging study after cycle 6 I will order repeat echocardiogram to monitor her cardiac function She has no clinical signs or symptoms of congestive heart failure

## 2020-08-19 NOTE — Telephone Encounter (Signed)
Pt. Aware of Echo appointment on Tuesday 08/25/20 at [facility]

## 2020-08-21 ENCOUNTER — Encounter: Payer: Self-pay | Admitting: Physical Medicine and Rehabilitation

## 2020-08-24 ENCOUNTER — Inpatient Hospital Stay: Payer: Medicare Other

## 2020-08-24 ENCOUNTER — Other Ambulatory Visit: Payer: Self-pay

## 2020-08-24 VITALS — BP 155/66 | HR 71 | Temp 98.0°F | Resp 16 | Wt 149.0 lb

## 2020-08-24 DIAGNOSIS — Z95828 Presence of other vascular implants and grafts: Secondary | ICD-10-CM

## 2020-08-24 DIAGNOSIS — C5702 Malignant neoplasm of left fallopian tube: Secondary | ICD-10-CM

## 2020-08-24 DIAGNOSIS — C5700 Malignant neoplasm of unspecified fallopian tube: Secondary | ICD-10-CM

## 2020-08-24 DIAGNOSIS — Z7189 Other specified counseling: Secondary | ICD-10-CM

## 2020-08-24 DIAGNOSIS — Z5111 Encounter for antineoplastic chemotherapy: Secondary | ICD-10-CM | POA: Diagnosis not present

## 2020-08-24 LAB — COMPREHENSIVE METABOLIC PANEL
ALT: 10 U/L (ref 0–44)
AST: 13 U/L — ABNORMAL LOW (ref 15–41)
Albumin: 3.3 g/dL — ABNORMAL LOW (ref 3.5–5.0)
Alkaline Phosphatase: 76 U/L (ref 38–126)
Anion gap: 7 (ref 5–15)
BUN: 17 mg/dL (ref 8–23)
CO2: 24 mmol/L (ref 22–32)
Calcium: 8.8 mg/dL — ABNORMAL LOW (ref 8.9–10.3)
Chloride: 105 mmol/L (ref 98–111)
Creatinine, Ser: 0.95 mg/dL (ref 0.44–1.00)
GFR, Estimated: >60 mL/min (ref >60)
Glucose, Bld: 100 mg/dL — ABNORMAL HIGH (ref 70–99)
Potassium: 4.1 mmol/L (ref 3.5–5.1)
Sodium: 136 mmol/L (ref 135–145)
Total Bilirubin: 0.3 mg/dL (ref 0.3–1.2)
Total Protein: 6.2 g/dL — ABNORMAL LOW (ref 6.5–8.1)

## 2020-08-24 LAB — CBC WITH DIFFERENTIAL/PLATELET
Abs Immature Granulocytes: 0 10*3/uL (ref 0.00–0.07)
Basophils Absolute: 0 10*3/uL (ref 0.0–0.1)
Basophils Relative: 0 %
Eosinophils Absolute: 0 10*3/uL (ref 0.0–0.5)
Eosinophils Relative: 1 %
HCT: 26.5 % — ABNORMAL LOW (ref 36.0–46.0)
Hemoglobin: 9 g/dL — ABNORMAL LOW (ref 12.0–15.0)
Immature Granulocytes: 0 %
Lymphocytes Relative: 24 %
Lymphs Abs: 0.7 10*3/uL (ref 0.7–4.0)
MCH: 32 pg (ref 26.0–34.0)
MCHC: 34 g/dL (ref 30.0–36.0)
MCV: 94.3 fL (ref 80.0–100.0)
Monocytes Absolute: 0.5 10*3/uL (ref 0.1–1.0)
Monocytes Relative: 18 %
Neutro Abs: 1.6 10*3/uL — ABNORMAL LOW (ref 1.7–7.7)
Neutrophils Relative %: 57 %
Platelets: 243 10*3/uL (ref 150–400)
RBC: 2.81 MIL/uL — ABNORMAL LOW (ref 3.87–5.11)
RDW: 15.8 % — ABNORMAL HIGH (ref 11.5–15.5)
WBC: 2.9 10*3/uL — ABNORMAL LOW (ref 4.0–10.5)
nRBC: 0 % (ref 0.0–0.2)

## 2020-08-24 MED ORDER — SODIUM CHLORIDE 0.9% FLUSH
10.0000 mL | INTRAVENOUS | Status: DC | PRN
  Administered 2020-08-24: 10 mL
  Filled 2020-08-24: qty 10

## 2020-08-24 MED ORDER — SODIUM CHLORIDE 0.9% FLUSH
10.0000 mL | Freq: Once | INTRAVENOUS | Status: AC
  Administered 2020-08-24: 10 mL
  Filled 2020-08-24: qty 10

## 2020-08-24 MED ORDER — DIPHENHYDRAMINE HCL 50 MG/ML IJ SOLN
25.0000 mg | Freq: Once | INTRAMUSCULAR | Status: AC
  Administered 2020-08-24: 25 mg via INTRAVENOUS

## 2020-08-24 MED ORDER — PALONOSETRON HCL INJECTION 0.25 MG/5ML
INTRAVENOUS | Status: AC
  Filled 2020-08-24: qty 5

## 2020-08-24 MED ORDER — PALONOSETRON HCL INJECTION 0.25 MG/5ML
0.2500 mg | Freq: Once | INTRAVENOUS | Status: AC
  Administered 2020-08-24: 0.25 mg via INTRAVENOUS

## 2020-08-24 MED ORDER — DIPHENHYDRAMINE HCL 50 MG/ML IJ SOLN
INTRAMUSCULAR | Status: AC
  Filled 2020-08-24: qty 1

## 2020-08-24 MED ORDER — DOXORUBICIN HCL LIPOSOMAL CHEMO INJECTION 2 MG/ML
29.0000 mg/m2 | Freq: Once | INTRAVENOUS | Status: AC
  Administered 2020-08-24: 50 mg via INTRAVENOUS
  Filled 2020-08-24: qty 25

## 2020-08-24 MED ORDER — FAMOTIDINE IN NACL 20-0.9 MG/50ML-% IV SOLN
INTRAVENOUS | Status: AC
  Filled 2020-08-24: qty 50

## 2020-08-24 MED ORDER — SODIUM CHLORIDE 0.9 % IV SOLN
150.0000 mg | Freq: Once | INTRAVENOUS | Status: AC
  Administered 2020-08-24: 150 mg via INTRAVENOUS
  Filled 2020-08-24: qty 150

## 2020-08-24 MED ORDER — FAMOTIDINE IN NACL 20-0.9 MG/50ML-% IV SOLN
20.0000 mg | Freq: Once | INTRAVENOUS | Status: AC
  Administered 2020-08-24: 20 mg via INTRAVENOUS

## 2020-08-24 MED ORDER — DEXTROSE 5 % IV SOLN
Freq: Once | INTRAVENOUS | Status: AC
  Filled 2020-08-24: qty 250

## 2020-08-24 MED ORDER — HEPARIN SOD (PORK) LOCK FLUSH 100 UNIT/ML IV SOLN
500.0000 [IU] | Freq: Once | INTRAVENOUS | Status: AC | PRN
  Administered 2020-08-24: 500 [IU]
  Filled 2020-08-24: qty 5

## 2020-08-24 MED ORDER — SODIUM CHLORIDE 0.9 % IV SOLN
10.0000 mg | Freq: Once | INTRAVENOUS | Status: AC
  Administered 2020-08-24: 10 mg via INTRAVENOUS
  Filled 2020-08-24: qty 10

## 2020-08-24 MED ORDER — SODIUM CHLORIDE 0.9 % IV SOLN
370.0000 mg | Freq: Once | INTRAVENOUS | Status: AC
  Administered 2020-08-24: 370 mg via INTRAVENOUS
  Filled 2020-08-24: qty 37

## 2020-08-24 NOTE — Patient Instructions (Signed)
[facility] Discharge Instructions for Patients Receiving Chemotherapy  Today you received the following chemotherapy agents: liposomal doxorubicin and carboplatin.  To help prevent nausea and vomiting after your treatment, we encourage you to take your nausea medication as directed.   If you develop nausea and vomiting that is not controlled by your nausea medication, call the clinic.   BELOW ARE SYMPTOMS THAT SHOULD BE REPORTED IMMEDIATELY:  *FEVER GREATER THAN 100.5 F  *CHILLS WITH OR WITHOUT FEVER  NAUSEA AND VOMITING THAT IS NOT CONTROLLED WITH YOUR NAUSEA MEDICATION  *UNUSUAL SHORTNESS OF BREATH  *UNUSUAL BRUISING OR BLEEDING  TENDERNESS IN MOUTH AND THROAT WITH OR WITHOUT PRESENCE OF ULCERS  *URINARY PROBLEMS  *BOWEL PROBLEMS  UNUSUAL RASH Items with * indicate a potential emergency and should be followed up as soon as possible.  Feel free to call the clinic should you have any questions or concerns. The clinic phone number is [phone].  Please show the [facility] at check-in to the Emergency Department and triage nurse.

## 2020-08-25 ENCOUNTER — Ambulatory Visit (HOSPITAL_COMMUNITY)
Admission: RE | Admit: 2020-08-25 | Discharge: 2020-08-25 | Disposition: A | Discharge status: Home / Self Care | Payer: Medicare Other | Source: Ambulatory Visit | Attending: Hematology and Oncology | Admitting: Hematology and Oncology

## 2020-08-25 ENCOUNTER — Ambulatory Visit: Payer: Medicare Other | Admitting: Physical Medicine and Rehabilitation

## 2020-08-25 ENCOUNTER — Telehealth: Payer: Self-pay

## 2020-08-25 DIAGNOSIS — C5700 Malignant neoplasm of unspecified fallopian tube: Secondary | ICD-10-CM

## 2020-08-25 DIAGNOSIS — Z79899 Other long term (current) drug therapy: Secondary | ICD-10-CM | POA: Insufficient documentation

## 2020-08-25 DIAGNOSIS — Z0189 Encounter for other specified special examinations: Secondary | ICD-10-CM | POA: Diagnosis not present

## 2020-08-25 DIAGNOSIS — Z5181 Encounter for therapeutic drug level monitoring: Secondary | ICD-10-CM | POA: Insufficient documentation

## 2020-08-25 DIAGNOSIS — Z5111 Encounter for antineoplastic chemotherapy: Secondary | ICD-10-CM | POA: Diagnosis not present

## 2020-08-25 DIAGNOSIS — R0600 Dyspnea, unspecified: Secondary | ICD-10-CM | POA: Diagnosis not present

## 2020-08-25 DIAGNOSIS — E785 Hyperlipidemia, unspecified: Secondary | ICD-10-CM | POA: Insufficient documentation

## 2020-08-25 LAB — CA 125: Cancer Antigen (CA) 125: 81.2 U/mL — ABNORMAL HIGH (ref 0.0–38.1)

## 2020-08-25 LAB — ECHOCARDIOGRAM COMPLETE
Area-P 1/2: 4.21 cm2
S' Lateral: 2.9 cm

## 2020-08-25 NOTE — Telephone Encounter (Signed)
-----   Message from [name], MD sent at 08/25/2020 11:38 AM EST ----- Regarding: let her know ECHO is ok

## 2020-08-25 NOTE — Progress Notes (Signed)
  Echocardiogram 2D Echocardiogram has been performed.  Chandler Swiderski G Daulton Harbaugh 08/25/2020, 10:03 AM

## 2020-08-25 NOTE — Telephone Encounter (Signed)
Called and given below message. She verbalzied understanding. 

## 2020-09-14 ENCOUNTER — Telehealth: Payer: Self-pay | Admitting: Oncology

## 2020-09-14 NOTE — Telephone Encounter (Signed)
Jessica Johnston called and said she has a canker sore on the end of her tongue and is wondering if Dr. Alvy Bimler recommends anything to help it heal.  She is also wondering how long she can take Advil (currently taking 6 tablets a day) and has been doing this for about 2 1/2 weeks.  She also said PT is starting to help for sciatica.

## 2020-09-15 ENCOUNTER — Telehealth: Payer: Self-pay | Admitting: Oncology

## 2020-09-15 NOTE — Telephone Encounter (Signed)
I am not aware of anything that could expedite the healing If she is bothered by pain, we can prescribe Magic [place] wash with lidocaine swish and spit

## 2020-09-15 NOTE — Telephone Encounter (Signed)
Called Jessica Johnston back and advised her of message from Dr. Alvy Bimler.  She said her canker sore is better today and she doesn't think she needs the magic mouthwash.  Advised her to call back if she does need it.

## 2020-09-18 ENCOUNTER — Telehealth: Payer: Self-pay | Admitting: Oncology

## 2020-09-18 NOTE — Telephone Encounter (Signed)
ok 

## 2020-09-18 NOTE — Telephone Encounter (Signed)
Jessica Johnston called and said she has a cold that started at the beginning of the week.  She went and had a Covid test on Wednesday that was negative.  She wants to make sure she is ok to come in for her appointments on Monday.  She denies having a fever, she does have "a little cough" but thinks she will feel better by Monday.

## 2020-09-21 ENCOUNTER — Inpatient Hospital Stay: Payer: Medicare Other

## 2020-09-21 ENCOUNTER — Other Ambulatory Visit: Payer: Self-pay

## 2020-09-21 ENCOUNTER — Telehealth: Payer: Self-pay | Admitting: Hematology and Oncology

## 2020-09-21 ENCOUNTER — Inpatient Hospital Stay: Payer: Medicare Other | Attending: Gynecologic Oncology | Admitting: Hematology and Oncology

## 2020-09-21 ENCOUNTER — Encounter: Payer: Self-pay | Admitting: Hematology and Oncology

## 2020-09-21 DIAGNOSIS — C5702 Malignant neoplasm of left fallopian tube: Secondary | ICD-10-CM | POA: Diagnosis not present

## 2020-09-21 DIAGNOSIS — Z5111 Encounter for antineoplastic chemotherapy: Secondary | ICD-10-CM | POA: Insufficient documentation

## 2020-09-21 DIAGNOSIS — D61818 Other pancytopenia: Secondary | ICD-10-CM

## 2020-09-21 DIAGNOSIS — M5431 Sciatica, right side: Secondary | ICD-10-CM

## 2020-09-21 DIAGNOSIS — Z95828 Presence of other vascular implants and grafts: Secondary | ICD-10-CM

## 2020-09-21 DIAGNOSIS — C5700 Malignant neoplasm of unspecified fallopian tube: Secondary | ICD-10-CM | POA: Diagnosis not present

## 2020-09-21 DIAGNOSIS — R030 Elevated blood-pressure reading, without diagnosis of hypertension: Secondary | ICD-10-CM

## 2020-09-21 LAB — COMPREHENSIVE METABOLIC PANEL
ALT: 13 U/L (ref 0–44)
AST: 14 U/L — ABNORMAL LOW (ref 15–41)
Albumin: 3 g/dL — ABNORMAL LOW (ref 3.5–5.0)
Alkaline Phosphatase: 75 U/L (ref 38–126)
Anion gap: 7 (ref 5–15)
BUN: 20 mg/dL (ref 8–23)
CO2: 25 mmol/L (ref 22–32)
Calcium: 8.6 mg/dL — ABNORMAL LOW (ref 8.9–10.3)
Chloride: 108 mmol/L (ref 98–111)
Creatinine, Ser: 0.85 mg/dL (ref 0.44–1.00)
GFR, Estimated: >60 mL/min (ref >60)
Glucose, Bld: 98 mg/dL (ref 70–99)
Potassium: 4.1 mmol/L (ref 3.5–5.1)
Sodium: 140 mmol/L (ref 135–145)
Total Bilirubin: 0.2 mg/dL — ABNORMAL LOW (ref 0.3–1.2)
Total Protein: 5.9 g/dL — ABNORMAL LOW (ref 6.5–8.1)

## 2020-09-21 LAB — CBC WITH DIFFERENTIAL/PLATELET
Abs Immature Granulocytes: 0.02 10*3/uL (ref 0.00–0.07)
Basophils Absolute: 0 10*3/uL (ref 0.0–0.1)
Basophils Relative: 0 %
Eosinophils Absolute: 0.1 10*3/uL (ref 0.0–0.5)
Eosinophils Relative: 3 %
HCT: 22.8 % — ABNORMAL LOW (ref 36.0–46.0)
Hemoglobin: 7.6 g/dL — ABNORMAL LOW (ref 12.0–15.0)
Immature Granulocytes: 1 %
Lymphocytes Relative: 24 %
Lymphs Abs: 0.7 10*3/uL (ref 0.7–4.0)
MCH: 32.5 pg (ref 26.0–34.0)
MCHC: 33.3 g/dL (ref 30.0–36.0)
MCV: 97.4 fL (ref 80.0–100.0)
Monocytes Absolute: 0.5 10*3/uL (ref 0.1–1.0)
Monocytes Relative: 18 %
Neutro Abs: 1.4 10*3/uL — ABNORMAL LOW (ref 1.7–7.7)
Neutrophils Relative %: 54 %
Platelets: 182 10*3/uL (ref 150–400)
RBC: 2.34 MIL/uL — ABNORMAL LOW (ref 3.87–5.11)
RDW: 15.7 % — ABNORMAL HIGH (ref 11.5–15.5)
WBC: 2.7 10*3/uL — ABNORMAL LOW (ref 4.0–10.5)
nRBC: 0 % (ref 0.0–0.2)

## 2020-09-21 MED ORDER — SODIUM CHLORIDE 0.9% FLUSH
10.0000 mL | Freq: Once | INTRAVENOUS | Status: AC
  Administered 2020-09-21: 10 mL
  Filled 2020-09-21: qty 10

## 2020-09-21 NOTE — Assessment & Plan Note (Signed)
She has mild worsening pancytopenia So far, she is not symptomatic She had recent mild viral upper respiratory tract infection that has since resolved I recommend delaying chemotherapy by at least a week to allow bone marrow recovery She doesn't need transfusion support today

## 2020-09-21 NOTE — Progress Notes (Signed)
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 therapy However, she has significant pancytopenia today I recommend delaying chemotherapy for a week to allow bone marrow recovery and she is in agreement Plan to reduce the dose of future chemotherapy to avoid worsening pancytopenia  Right sided sciatica I did not see anything in her imaging study that would explain her right-sided sciatica She is doing well with physical therapy In the meantime, I advised her to take some over-the-counter analgesics for pain relief  Pancytopenia, acquired ([place]) She has mild worsening pancytopenia So far, she is not symptomatic She had recent mild viral upper respiratory tract infection that has since resolved I recommend delaying chemotherapy by at least a week to allow bone marrow recovery She doesn't need transfusion support today  Elevated BP without diagnosis of hypertension Her blood pressure is noted to be trending up She has been taking at least six ibuprofen per day for the past month for sciatica pain I recommend stopping ibuprofen and substituting with acetaminophen instead I do not feel strongly she needs to be placed on blood pressure medication   Orders Placed This Encounter  Procedures  . Sample to Blood Bank    Standing Status:   Standing    Number of Occurrences:   33    Standing Expiration Date:   09/21/2021    All questions were answered. The patient knows to call the clinic with any problems, questions or concerns. The total time spent in the appointment was 40 minutes encounter with patients including review of chart and various tests results, discussions about plan of care and coordination of care plan   [name], MD 09/21/2020 8:44 AM  INTERVAL HISTORY: Please see below for problem oriented  charting. She returns for further chemotherapy today Since last time I saw her, she felt that physical therapy is improving his sciatica She is taking approximately 6 ibuprofen per day for pain The patient denies any recent signs or symptoms of bleeding such as spontaneous epistaxis, hematuria or hematochezia. She is recovering from an upper viral illness.  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 Extensive omental nodularity highly worrisome for peritoneal carcinomatosis. Late recurrence of melanoma can present as peritoneal carcinomatosis. Ovarian cancer more commonly presents in this manner. Patient does have a predominately low density right adnexal lesion measuring up to 3.5 cm, although this lesion is not typical for ovarian cancer. 2. No evidence of bowel or ureteral obstruction. 3. No other definite signs of metastatic disease. There are small indeterminate low-density hepatic lesions.   02/08/2016 Tumor Marker   Patient's tumor was tested for the following markers: CA-125 Results of the tumor marker test revealed  656.2   02/15/2016 Procedure   CT-guided core biopsy performed of omental mass just deep to the abdominal wall.   02/15/2016 Pathology Results   Omentum, biopsy, left - PAPILLARY SEROUS NEOPLASM, SEE COMMENT.  Microscopic Comment There are papillary collections of largely low grade appearing cells with numerous psammoma bodies. Given the limited material it is difficult to distinguish between invasive implants of a serous borderline tumor and serous carcinoma, especially given the low grade appearance.   02/18/2016 Pathology Results   1. Omentum, resection for tumor INVASIVE IMPLANT OF HIGH GRADE SEROUS CARCINOMA 2. Uterus +/- tubes/ovaries, neoplastic HIGH GRADE SEROUS CARCINOMA INVOLVING LEFT TUBAL FIMBRIA SEROUS CARCINOMA WITH PREDOMINANT PSAMMOMA BODIES IMPLANT AT UTERUS SEROSA, BILATERAL FALLOPIAN TUBAL SEROSA, AND ANTERIOR PERITONEAL REFLECTION CERVIX: HISTOLOGICAL UNREMARKABLE ENDOMETRIUM: INACTIVE ENDOMETRIUM MYOMETRIUM: LEIOMYOMA LEFT OVARY: CYSTADENOFIBROMA RIGHT OVARY AND FALLOPIAN TUBE: HISTOLOGICAL UNREMARKABLE Microscopic Comment 2. ONCOLOGY TABLE - FALLOPIAN TUBE 1. Specimen, including laterality: Omentum, uterus, bilateral ovaries and fallopian tubes 2. Procedure: Hysterectomy, bilateral salpingo-oophorectomy and tumor debulking omentectomy 3. Lymph node sampling performed: No 4. Tumor site: uterus serosa, bilateral fallopian tubal serosa, peritoneal and omentum 5. Tumor location in fallopian tube: Left fallopian tubal fimbria 6. Specimen integrity (intact/ruptured/disrupted): Intact 7. Tumor size (cm): multi focal invasive omentum implant greater than 2 cm, left fallopian tube tumor 0.8 cm. 8. Histologic type: Serous carcinoma 9. Grade: 3 10. Microscopic tumor extension: uterus serosa, bilateral fallopian tubal serosa, peritoneal and omentum 11. Margins: NA 12. Lymph-Vascular invasion: identified 13. Lymph nodes: # examined: 0; # positive: NA 14. TNM: pT3c, pNx 15. FIGO Stage (based on pathologic findings, needs clinical correlation: IIIC 16. Comment: High grade serous carcinoma multifocally and extensively involves left fallopian tubal fimbria, omentum, uterus serosa,  bilateral fallopian tubal serosa and peritoneal. At the left fallopian tube there are small foci of serous tubal intraepithelial carcinoma identified, so we conclude the carcinoma is fallopian tube origin   03/09/2016 Tumor Marker   Patient's tumor was tested for the following markers: CA-125 Results of the tumor marker test revealed 154.4   03/15/2016 Procedure   Technically successful right IJ power-injectable port catheter placement. Ready for routine use.   03/18/2016 - 07/13/2016 Chemotherapy   The patient had 6 cycles of carboplatin and taxol   04/14/2016 Tumor Marker   Patient's tumor was tested for the following markers: CA-125 Results of the tumor marker test revealed 36.7   04/25/2016 Genetic Testing   Patient has genetic testing done for germline mutation Results revealed patient has no mutation   04/28/2016 Tumor Marker   Patient's tumor was tested for the following markers: CA-125 Results of the tumor marker test revealed 24.6   06/21/2016 Tumor Marker   Patient's tumor was tested for the following markers: CA-125 Results of the tumor marker test revealed 16.6   08/01/2016 Tumor Marker   Patient's tumor was tested for the following markers: CA-125 Results of the tumor marker test revealed 17.2   08/05/2016 Imaging   Interval TAH-BSO. No evidence of residual pelvic mass or metastatic disease within the abdomen or pelvis. No other acute findings.    11/04/2016 Tumor Marker   Patient's tumor was tested for the following markers: CA-125 Results of the tumor marker test revealed 13.6   01/20/2017 Tumor Marker   Patient's tumor was tested for the following markers: CA-125 Results of the tumor marker test revealed 13.8   04/14/2017 Tumor Marker   Patient's tumor was tested for the following markers: CA-125 Results of the tumor marker test  revealed 16.1   06/13/2017 Imaging   No acute findings.  No mass or hernia identified.  Colonic diverticulosis, without radiographic  evidence of diverticulitis.  Aortic atherosclerosis.   07/03/2017 Tumor Marker   Patient's tumor was tested for the following markers: CA-125 Results of the tumor marker test revealed 18.4   09/19/2017 Tumor Marker   Patient's tumor was tested for the following markers: CA-125 Results of the tumor marker test revealed 18.5   01/23/2018 Tumor Marker   Patient's tumor was tested for the following markers: CA-125 Results of the tumor marker test revealed 35.4   01/30/2018 Imaging   1. No evidence of local fallopian tube carcinoma recurrence within the pelvis. Post hysterectomy. 2. No evidence of metastatic peritoneal disease or omental disease. No solid organ metastasis.    03/20/2018 Tumor Marker   Patient's tumor was tested for the following markers: CA-125 Results of the tumor marker test revealed 73   05/02/2018 Tumor Marker   Patient's tumor was tested for the following markers: CA-125 Results of the tumor marker test revealed 127.1   05/08/2018 Imaging   CT abdomen and pelvis 1. 7 cm left subdiaphragmatic collection of loculated fluid versus low-density soft tissue. Given the patient's history of rising CA 125, metastatic disease considered highly likely.  2. Areas of fluid or recurrent disease identified in the right lower quadrant adjacent to the cecum and along right lower quadrant small bowel loops.   05/14/2018 Tumor Marker   Patient's tumor was tested for the following markers: CA-125 Results of the tumor marker test revealed 166    Genetic Testing   Patient has genetic testing done for ER/PR and MMR. Results revealed patient has the following on 02/18/2016 surgical pathology: ER 20%, PR 0% MMR: normal    Genetic Testing   Patient has genetic testing done for MSI. Results revealed patient has the following: MSI stable   05/18/2018 - 12/31/2018 Chemotherapy   The patient had carboplatin and Doxil   07/16/2018 Tumor Marker   Patient's tumor was tested for the  following markers: CA-125 Results of the tumor marker test revealed 28.7   08/10/2018 Imaging   CT imaging:  Decreased size of peritoneal soft tissue masses in left upper quadrant, consistent with decreased metastatic disease.  No new or progressive metastatic disease. No other acute findings.  Colonic diverticulosis, without radiographic evidence of diverticulitis.  Stable small paraumbilical ventral hernia containing transverse colon.   08/13/2018 Tumor Marker   Patient's tumor was tested for the following markers: CA-125 Results of the tumor marker test revealed 21.6   08/24/2018 Echocardiogram   LV EF: 60% -  65%   10/15/2018 Tumor Marker   Patient's tumor was tested for the following markers: CA-125 Results of the tumor marker test revealed 20.1   10/19/2018 Imaging   Ct scan of abdomen and pelvis Status post hysterectomy, bilateral salpingo-oophorectomy, and omentectomy.  Two peritoneal implants in the left upper abdomen are stable versus mildly decreased, as above. No abdominopelvic ascites.  No evidence of new/progressive metastatic disease.     11/19/2018 Tumor Marker   Patient's tumor was tested for the following markers: CA-125 Results of the tumor marker test revealed 21.7   11/23/2018 Echocardiogram   1. The left ventricle has normal systolic function of 10-62%. The cavity size was normal. There is no increased left ventricular wall thickness. Echo evidence of impaired diastolic relaxation.  2. The right ventricle has normal systolic function. The cavity was normal. There is no increase in  right ventricular wall thickness.  3. The mitral valve is normal in structure. There is mild mitral annular calcification present.  4. The tricuspid valve is normal in structure.  5. The aortic valve is tricuspid There is mild thickening of the aortic valve.  6. The pulmonic valve was normal in structure. Pulmonic valve regurgitation is mild by color flow Doppler.  7. Normal  LV systolic function; mild diastolic dysfunction.   12/31/2018 Tumor Marker   Patient's tumor was tested for the following markers: CA-125 Results of the tumor marker test revealed 17.1   01/31/2019 Imaging   1. Left upper quadrant peritoneal implants described previously have decreased in the interval. No new or progressive findings in the abdomen/pelvis today. No free fluid. 2. Right paraumbilical ventral hernia contains a small knuckle of transverse colon without complicating features. 3.  Aortic Atherosclerois (ICD10-170.0)  Aortic Atherosclerosis (ICD10-I70.0).   01/31/2019 Tumor Marker   Patient's tumor was tested for the following markers: CA-125 Results of the tumor marker test revealed 18.9   02/11/2019 - 03/13/2019 Chemotherapy   The patient is taking Niraparib. She self discontinued after 1 month   02/25/2019 Tumor Marker   Patient's tumor was tested for the following markers: CA-125 Results of the tumor marker test revealed 18.6   05/13/2019 Tumor Marker   Patient's tumor was tested for the following markers: CA-125 Results of the tumor marker test revealed 230   05/22/2019 Imaging   1. Increased size of 4.6 cm soft tissue mass in the gastrosplenic ligament, consistent with metastatic disease. 2. No other sites of metastatic disease identified within the abdomen or pelvis. 3. Colonic diverticulosis. No radiographic evidence of diverticulitis. 4. Stable small right paraumbilical hernia containing transverse colon.   Aortic Atherosclerosis (ICD10-I70.0).   05/31/2019 - 08/22/2019 Chemotherapy   The patient had bevacizumab and Taxol for chemotherapy treatment.     05/31/2019 Tumor Marker   Patient's tumor was tested for the following markers: CA-125 Results of the tumor marker test revealed 84   06/21/2019 Tumor Marker   Patient's tumor was tested for the following markers: CA-125 Results of the tumor marker test revealed 49.4.   07/19/2019 Tumor Marker   Patient's tumor  was tested for the following markers: CA-125. Results of the tumor marker test revealed 44.5   08/22/2019 Imaging   1. Increase in size of 5.0 cm mass in the gastro splenic ligament consistent with metastatic disease. 2. No additional sites of disease identified within the abdomen or pelvis. 3. Unchanged paraumbilical hernia containing nonobstructed loop of large bowel. 4.  Aortic Atherosclerosis (ICD10-I70.0).     08/30/2019 Tumor Marker   Patient's tumor was tested for the following markers: CA-125 Results of the tumor marker test revealed 71.5   08/30/2019 - 05/11/2020 Chemotherapy   The patient had gemzar for chemotherapy treatment.     09/27/2019 Tumor Marker   Patient's tumor was tested for the following markers: CA-125. Results of the tumor marker test revealed 38.5   10/14/2019 Tumor Marker   Patient's tumor was tested for the following markers: CA-125 Results of the tumor marker test revealed 40.8   10/21/2019 Tumor Marker   Patient's tumor was tested for the following markers: CA-125 Results of the tumor marker test revealed 34.8.   11/22/2019 Imaging   1. Interval decrease in size of left upper quadrant cystic and solid peritoneal lesion. 2. No new sites of disease. 3. Unchanged periumbilical hernia containing a nonobstructed loop of small bowel. 4.  Aortic Atherosclerosis (ICD10-I70.0).  11/25/2019 Tumor Marker   Patient's tumor was tested for the following markers: CA-125 Results of the tumor marker test revealed 22.7   12/23/2019 Tumor Marker   Patient's tumor was tested for the following markers: CA-125 Results of the tumor marker test revealed 26.3   01/20/2020 Tumor Marker   Patient's tumor was tested for the following markers: CA-125 Results of the tumor marker test revealed 28.3   02/14/2020 Imaging   1. 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 2. No new or progressive disease identified within the abdomen or  pelvis. 3. Colonic diverticulosis. No radiographic evidence of diverticulitis. 4. Small paraumbilical ventral hernia.     02/17/2020 Tumor Marker   Patient's tumor was tested for the following markers: CA-125 Results of the tumor marker test revealed 40.1   05/11/2020 Tumor Marker   Patient's tumor was tested for the following markers: CA-125 Results of the tumor marker test revealed 113.   05/22/2020 Imaging   1. 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 2. No evidence of ascites, bowel or ureteral obstruction. 3. 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 No evidence of incarceration or obstruction. 4. Aortic Atherosclerosis (ICD10-I70.0).   05/22/2020 Tumor Marker   Patient's tumor was tested for the following markers: CA-125 Results of the tumor marker test revealed 118   05/29/2020 Echocardiogram   1. Normal GLS -19.8. Left ventricular ejection fraction, by estimation, is 55 to 60%. The left ventricle has normal function. The left ventricle has no regional wall motion abnormalities. Left ventricular diastolic parameters were normal.  2. Right ventricular systolic function is normal. The right ventricular size is normal.  3. The mitral valve is degenerative. Trivial mitral valve regurgitation. No evidence of mitral stenosis.  4. The aortic valve is tricuspid. Aortic valve regurgitation is not visualized. Mild to moderate aortic valve sclerosis/calcification is present, without any evidence of aortic stenosis.  5. The inferior vena cava is normal in size with greater than 50% respiratory variability, suggesting right atrial pressure of 3 mmHg.   06/01/2020 -  Chemotherapy   The patient had carboplatin and doxil for chemotherapy treatment.     06/01/2020 Tumor Marker   Patient's tumor was tested for the following markers: CA-125 Results  of the tumor marker test revealed 126.   06/29/2020 Tumor Marker   Patient's tumor was tested for the following markers: CA-125. Results of the tumor marker test revealed 108   07/27/2020 Tumor Marker   Patient's tumor was tested for the following markers: CA-125. Results of the tumor marker test revealed 89.1   08/18/2020 Imaging   1. Interval positive response to therapy. 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 No new or progressive metastatic disease in the abdomen or pelvis. 2. Chronic findings include: Small hiatal hernia. Mild to moderate colonic diverticulosis. Aortic Atherosclerosis (ICD10-I70.0).   08/24/2020 Tumor Marker   Patient's tumor was tested for the following markers: CA-125 Results of the tumor marker test revealed 81.2   08/25/2020 Echocardiogram   1. Left ventricular ejection fraction, by estimation, is 60 to 65%. The left ventricle has normal function. The left ventricle has no regional wall motion abnormalities. Left ventricular diastolic parameters were normal. The average left ventricular global longitudinal strain is -21.4 %. The global longitudinal strain is normal.  2. Right ventricular systolic function  is normal. The right ventricular size is normal.  3. The mitral valve is normal in structure. No evidence of mitral valve regurgitation. No evidence of mitral stenosis.  4. The aortic valve is tricuspid. There is moderate calcification of the aortic valve. There is moderate thickening of the aortic valve. Aortic valve regurgitation is not visualized. Mild to moderate aortic valve sclerosis/calcification is present, without any evidence of aortic stenosis.  5. Aortic dilatation noted. There is mild dilatation of the aortic root, measuring 37 mm.  6. The inferior vena cava is normal in size with greater than 50% respiratory variability, suggesting right atrial pressure of 3 mmHg.     REVIEW OF SYSTEMS:    Constitutional: Denies fevers, chills or abnormal weight loss Eyes: Denies blurriness of vision Ears, nose, mouth, throat, and face: Denies mucositis or sore throat Respiratory: Denies cough, dyspnea or wheezes Cardiovascular: Denies palpitation, chest discomfort or lower extremity swelling Gastrointestinal:  Denies nausea, heartburn or change in bowel habits Skin: Denies abnormal skin rashes Lymphatics: Denies new lymphadenopathy or easy bruising Behavioral/Psych: Mood is stable, no new changes  All other systems were reviewed with the patient and are negative.  I have reviewed the past medical history, past surgical history, social history and family history with the patient and they are unchanged from previous note.  ALLERGIES:  has No Known Allergies.  MEDICATIONS:  Current Outpatient Medications  Medication Sig Dispense Refill  . Cholecalciferol (VITAMIN D) 2000 units CAPS Take 1 capsule by mouth daily.    Marland Kitchen ezetimibe (ZETIA) 10 MG tablet Take 10 mg by mouth daily.    Marland Kitchen lidocaine-prilocaine (EMLA) cream Apply to affected area once 30 g 3  . morphine (MSIR) 15 MG tablet Take 1 tablet (15 mg total) by mouth every 4 (four) hours as needed for severe pain. 30 tablet 0  . ondansetron (ZOFRAN) 8 MG tablet Take 1 tablet (8 mg total) by mouth 2 (two) times daily as needed (Nausea or vomiting). 30 tablet 1  . prochlorperazine (COMPAZINE) 10 MG tablet Take 1 tablet (10 mg total) by mouth every 6 (six) hours as needed (Nausea or vomiting). 60 tablet 1   No current facility-administered medications for this visit.    PHYSICAL EXAMINATION: ECOG PERFORMANCE STATUS: 1 - Symptomatic but completely ambulatory  Vitals:   09/21/20 0809  BP: (!) 154/50  Pulse: 78  Resp: 18  Temp: 98.4 F (36.9 C)  SpO2: 100%   Filed Weights   09/21/20 0809  Weight: 151 lb 6.4 oz (68.7 kg)    GENERAL:alert, no distress and comfortable Musculoskeletal:no cyanosis of digits and no clubbing  NEURO: alert  & oriented x 3 with fluent speech, no focal motor/sensory deficits  LABORATORY DATA:  I have reviewed the data as listed    Component Value Date/Time   NA 140 09/21/2020 0759   NA 139 06/07/2017 1430   K 4.1 09/21/2020 0759   K 3.8 06/07/2017 1430   CL 108 09/21/2020 0759   CO2 25 09/21/2020 0759   CO2 27 06/07/2017 1430   GLUCOSE 98 09/21/2020 0759   GLUCOSE 124 06/07/2017 1430   BUN 20 09/21/2020 0759   BUN 14.0 06/07/2017 1430   CREATININE 0.85 09/21/2020 0759   CREATININE 0.78 01/20/2020 1029   CREATININE 0.8 06/07/2017 1430   CALCIUM 8.6 (L) 09/21/2020 0759   CALCIUM 9.3 06/07/2017 1430   PROT 5.9 (L) 09/21/2020 0759   PROT 6.6 09/30/2016 1027   ALBUMIN 3.0 (L) 09/21/2020 0759   ALBUMIN  3.5 09/30/2016 1027   AST 14 (L) 09/21/2020 0759   AST 20 01/20/2020 1029   AST 18 09/30/2016 1027   ALT 13 09/21/2020 0759   ALT 28 01/20/2020 1029   ALT 21 09/30/2016 1027   ALKPHOS 75 09/21/2020 0759   ALKPHOS 77 09/30/2016 1027   BILITOT 0.2 (L) 09/21/2020 0759   BILITOT 0.2 (L) 01/20/2020 1029   BILITOT 0.37 09/30/2016 1027   GFRNONAA >60 09/21/2020 0759   GFRNONAA >60 01/20/2020 1029   GFRAA >60 06/29/2020 0913   GFRAA >60 01/20/2020 1029    No results found for: SPEP, UPEP  Lab Results  Component Value Date   WBC 2.7 (L) 09/21/2020   NEUTROABS 1.4 (L) 09/21/2020   HGB 7.6 (L) 09/21/2020   HCT 22.8 (L) 09/21/2020   MCV 97.4 09/21/2020   PLT 182 09/21/2020      Chemistry      Component Value Date/Time   NA 140 09/21/2020 0759   NA 139 06/07/2017 1430   K 4.1 09/21/2020 0759   K 3.8 06/07/2017 1430   CL 108 09/21/2020 0759   CO2 25 09/21/2020 0759   CO2 27 06/07/2017 1430   BUN 20 09/21/2020 0759   BUN 14.0 06/07/2017 1430   CREATININE 0.85 09/21/2020 0759   CREATININE 0.78 01/20/2020 1029   CREATININE 0.8 06/07/2017 1430      Component Value Date/Time   CALCIUM 8.6 (L) 09/21/2020 0759   CALCIUM 9.3 06/07/2017 1430   ALKPHOS 75 09/21/2020 0759    ALKPHOS 77 09/30/2016 1027   AST 14 (L) 09/21/2020 0759   AST 20 01/20/2020 1029   AST 18 09/30/2016 1027   ALT 13 09/21/2020 0759   ALT 28 01/20/2020 1029   ALT 21 09/30/2016 1027   BILITOT 0.2 (L) 09/21/2020 0759   BILITOT 0.2 (L) 01/20/2020 1029   BILITOT 0.37 09/30/2016 1027

## 2020-09-21 NOTE — Assessment & Plan Note (Signed)
I did not see anything in her imaging study that would explain her right-sided sciatica She is doing well with physical therapy In the meantime, I advised her to take some over-the-counter analgesics for pain relief

## 2020-09-21 NOTE — Telephone Encounter (Signed)
Scheduled appts per 12/6 sch msg. Pt stated she would refer to mychart for AVS and appt details.

## 2020-09-21 NOTE — Assessment & Plan Note (Signed)
Recent imaging show positive response to therapy However, she has significant pancytopenia today I recommend delaying chemotherapy for a week to allow bone marrow recovery and she is in agreement Plan to reduce the dose of future chemotherapy to avoid worsening pancytopenia

## 2020-09-21 NOTE — Assessment & Plan Note (Signed)
Her blood pressure is noted to be trending up She has been taking at least six ibuprofen per day for the past month for sciatica pain I recommend stopping ibuprofen and substituting with acetaminophen instead I do not feel strongly she needs to be placed on blood pressure medication

## 2020-09-22 ENCOUNTER — Telehealth: Payer: Self-pay

## 2020-09-22 LAB — CA 125: Cancer Antigen (CA) 125: 72.7 U/mL — ABNORMAL HIGH (ref 0.0–38.1)

## 2020-09-22 NOTE — Telephone Encounter (Signed)
Called and given below message. She verbalized understanding. 

## 2020-09-22 NOTE — Telephone Encounter (Signed)
-----   Message from [name], MD sent at 09/22/2020 10:09 AM EST ----- Regarding: pls let her know CA-125 is better

## 2020-09-25 DIAGNOSIS — M25551 Pain in right hip: Secondary | ICD-10-CM | POA: Diagnosis not present

## 2020-09-29 ENCOUNTER — Telehealth: Payer: Self-pay | Admitting: Oncology

## 2020-09-29 NOTE — Telephone Encounter (Signed)
Opened in error

## 2020-10-01 ENCOUNTER — Inpatient Hospital Stay: Payer: Medicare Other

## 2020-10-01 ENCOUNTER — Other Ambulatory Visit: Payer: Self-pay

## 2020-10-01 ENCOUNTER — Ambulatory Visit: Payer: Medicare Other | Admitting: Physical Medicine and Rehabilitation

## 2020-10-01 ENCOUNTER — Other Ambulatory Visit: Payer: Self-pay | Admitting: Hematology and Oncology

## 2020-10-01 VITALS — BP 122/71 | HR 82 | Temp 98.0°F | Resp 17

## 2020-10-01 DIAGNOSIS — Z5111 Encounter for antineoplastic chemotherapy: Secondary | ICD-10-CM | POA: Diagnosis not present

## 2020-10-01 DIAGNOSIS — M5431 Sciatica, right side: Secondary | ICD-10-CM | POA: Diagnosis not present

## 2020-10-01 DIAGNOSIS — C5702 Malignant neoplasm of left fallopian tube: Secondary | ICD-10-CM | POA: Diagnosis not present

## 2020-10-01 DIAGNOSIS — C5700 Malignant neoplasm of unspecified fallopian tube: Secondary | ICD-10-CM

## 2020-10-01 DIAGNOSIS — D61818 Other pancytopenia: Secondary | ICD-10-CM

## 2020-10-01 DIAGNOSIS — R030 Elevated blood-pressure reading, without diagnosis of hypertension: Secondary | ICD-10-CM | POA: Diagnosis not present

## 2020-10-01 DIAGNOSIS — Z7189 Other specified counseling: Secondary | ICD-10-CM

## 2020-10-01 LAB — CMP (CANCER CENTER ONLY)
ALT: 18 U/L (ref 0–44)
AST: 17 U/L (ref 15–41)
Albumin: 3.6 g/dL (ref 3.5–5.0)
Alkaline Phosphatase: 98 U/L (ref 38–126)
Anion gap: 11 (ref 5–15)
BUN: 24 mg/dL — ABNORMAL HIGH (ref 8–23)
CO2: 23 mmol/L (ref 22–32)
Calcium: 9.1 mg/dL (ref 8.9–10.3)
Chloride: 100 mmol/L (ref 98–111)
Creatinine: 1.04 mg/dL — ABNORMAL HIGH (ref 0.44–1.00)
GFR, Estimated: 55 mL/min — ABNORMAL LOW (ref >60)
Glucose, Bld: 132 mg/dL — ABNORMAL HIGH (ref 70–99)
Potassium: 4.4 mmol/L (ref 3.5–5.1)
Sodium: 134 mmol/L — ABNORMAL LOW (ref 135–145)
Total Bilirubin: 0.3 mg/dL (ref 0.3–1.2)
Total Protein: 6.7 g/dL (ref 6.5–8.1)

## 2020-10-01 LAB — CBC WITH DIFFERENTIAL (CANCER CENTER ONLY)
Abs Immature Granulocytes: 0.01 10*3/uL (ref 0.00–0.07)
Basophils Absolute: 0 10*3/uL (ref 0.0–0.1)
Basophils Relative: 0 %
Eosinophils Absolute: 0.1 10*3/uL (ref 0.0–0.5)
Eosinophils Relative: 1 %
HCT: 25 % — ABNORMAL LOW (ref 36.0–46.0)
Hemoglobin: 8.4 g/dL — ABNORMAL LOW (ref 12.0–15.0)
Immature Granulocytes: 0 %
Lymphocytes Relative: 25 %
Lymphs Abs: 1.1 10*3/uL (ref 0.7–4.0)
MCH: 33.2 pg (ref 26.0–34.0)
MCHC: 33.6 g/dL (ref 30.0–36.0)
MCV: 98.8 fL (ref 80.0–100.0)
Monocytes Absolute: 0.4 10*3/uL (ref 0.1–1.0)
Monocytes Relative: 10 %
Neutro Abs: 2.8 10*3/uL (ref 1.7–7.7)
Neutrophils Relative %: 64 %
Platelet Count: 261 10*3/uL (ref 150–400)
RBC: 2.53 MIL/uL — ABNORMAL LOW (ref 3.87–5.11)
RDW: 14.4 % (ref 11.5–15.5)
WBC Count: 4.5 10*3/uL (ref 4.0–10.5)
nRBC: 0 % (ref 0.0–0.2)

## 2020-10-01 LAB — SAMPLE TO BLOOD BANK

## 2020-10-01 MED ORDER — SODIUM CHLORIDE 0.9% FLUSH
10.0000 mL | INTRAVENOUS | Status: DC | PRN
  Administered 2020-10-01: 18:00:00 10 mL
  Filled 2020-10-01: qty 10

## 2020-10-01 MED ORDER — DEXTROSE 5 % IV SOLN
Freq: Once | INTRAVENOUS | Status: AC
  Filled 2020-10-01: qty 250

## 2020-10-01 MED ORDER — FAMOTIDINE IN NACL 20-0.9 MG/50ML-% IV SOLN
INTRAVENOUS | Status: AC
  Filled 2020-10-01: qty 50

## 2020-10-01 MED ORDER — SODIUM CHLORIDE 0.9 % IV SOLN
290.4000 mg | Freq: Once | INTRAVENOUS | Status: AC
  Administered 2020-10-01: 18:00:00 290 mg via INTRAVENOUS
  Filled 2020-10-01: qty 29

## 2020-10-01 MED ORDER — SODIUM CHLORIDE 0.9 % IV SOLN
10.0000 mg | Freq: Once | INTRAVENOUS | Status: AC
  Administered 2020-10-01: 15:00:00 10 mg via INTRAVENOUS
  Filled 2020-10-01: qty 10
  Filled 2020-10-01: qty 1

## 2020-10-01 MED ORDER — DIPHENHYDRAMINE HCL 50 MG/ML IJ SOLN
INTRAMUSCULAR | Status: AC
  Filled 2020-10-01: qty 1

## 2020-10-01 MED ORDER — DOXORUBICIN HCL LIPOSOMAL CHEMO INJECTION 2 MG/ML
24.0000 mg/m2 | Freq: Once | INTRAVENOUS | Status: AC
  Administered 2020-10-01: 17:00:00 42 mg via INTRAVENOUS
  Filled 2020-10-01: qty 21

## 2020-10-01 MED ORDER — PALONOSETRON HCL INJECTION 0.25 MG/5ML
0.2500 mg | Freq: Once | INTRAVENOUS | Status: AC
  Administered 2020-10-01: 15:00:00 0.25 mg via INTRAVENOUS

## 2020-10-01 MED ORDER — HEPARIN SOD (PORK) LOCK FLUSH 100 UNIT/ML IV SOLN
500.0000 [IU] | Freq: Once | INTRAVENOUS | Status: AC | PRN
  Administered 2020-10-01: 18:00:00 500 [IU]
  Filled 2020-10-01: qty 5

## 2020-10-01 MED ORDER — DIPHENHYDRAMINE HCL 50 MG/ML IJ SOLN
25.0000 mg | Freq: Once | INTRAMUSCULAR | Status: AC
  Administered 2020-10-01: 15:00:00 25 mg via INTRAVENOUS

## 2020-10-01 MED ORDER — SODIUM CHLORIDE 0.9 % IV SOLN
150.0000 mg | Freq: Once | INTRAVENOUS | Status: AC
  Administered 2020-10-01: 15:00:00 150 mg via INTRAVENOUS
  Filled 2020-10-01: qty 150
  Filled 2020-10-01: qty 5

## 2020-10-01 MED ORDER — FAMOTIDINE IN NACL 20-0.9 MG/50ML-% IV SOLN
20.0000 mg | Freq: Once | INTRAVENOUS | Status: AC
  Administered 2020-10-01: 16:00:00 20 mg via INTRAVENOUS

## 2020-10-01 MED ORDER — PALONOSETRON HCL INJECTION 0.25 MG/5ML
INTRAVENOUS | Status: AC
  Filled 2020-10-01: qty 5

## 2020-10-01 NOTE — Patient Instructions (Signed)
[facility] Discharge Instructions for Patients Receiving Chemotherapy  Today you received the following chemotherapy agents: doxil, carboplatin  To help prevent nausea and vomiting after your treatment, we encourage you to take your nausea medication as directed.    If you develop nausea and vomiting that is not controlled by your nausea medication, call the clinic.   BELOW ARE SYMPTOMS THAT SHOULD BE REPORTED IMMEDIATELY:  *FEVER GREATER THAN 100.5 F  *CHILLS WITH OR WITHOUT FEVER  NAUSEA AND VOMITING THAT IS NOT CONTROLLED WITH YOUR NAUSEA MEDICATION  *UNUSUAL SHORTNESS OF BREATH  *UNUSUAL BRUISING OR BLEEDING  TENDERNESS IN MOUTH AND THROAT WITH OR WITHOUT PRESENCE OF ULCERS  *URINARY PROBLEMS  *BOWEL PROBLEMS  UNUSUAL RASH Items with * indicate a potential emergency and should be followed up as soon as possible.  Feel free to call the clinic should you have any questions or concerns. The clinic phone number is [phone].  Please show the [place] at check-in to the Emergency Department and triage nurse.

## 2020-10-02 LAB — CA 125: Cancer Antigen (CA) 125: 80.9 U/mL — ABNORMAL HIGH (ref 0.0–38.1)

## 2020-10-29 ENCOUNTER — Inpatient Hospital Stay: Payer: Medicare Other

## 2020-10-29 ENCOUNTER — Inpatient Hospital Stay: Payer: Medicare Other | Attending: Gynecologic Oncology

## 2020-10-29 ENCOUNTER — Other Ambulatory Visit: Payer: Self-pay | Admitting: Hematology and Oncology

## 2020-10-29 ENCOUNTER — Other Ambulatory Visit: Payer: Self-pay

## 2020-10-29 ENCOUNTER — Telehealth: Payer: Self-pay | Admitting: Hematology and Oncology

## 2020-10-29 ENCOUNTER — Inpatient Hospital Stay (HOSPITAL_BASED_OUTPATIENT_CLINIC_OR_DEPARTMENT_OTHER): Payer: Medicare Other | Admitting: Hematology and Oncology

## 2020-10-29 ENCOUNTER — Encounter: Payer: Self-pay | Admitting: Hematology and Oncology

## 2020-10-29 ENCOUNTER — Ambulatory Visit: Admit: 2020-10-29 | Discharge: 2020-10-29 | Payer: MEDICARE | Attending: Family Medicine | Primary: Family Medicine

## 2020-10-29 ENCOUNTER — Ambulatory Visit: Attending: Family Medicine | Primary: Family Medicine

## 2020-10-29 VITALS — BP 163/75 | HR 78 | Temp 97.4°F | Resp 18 | Ht 64.0 in | Wt 149.2 lb

## 2020-10-29 DIAGNOSIS — E119 Type 2 diabetes mellitus without complications: Secondary | ICD-10-CM

## 2020-10-29 DIAGNOSIS — Z0000 Encounter for general adult medical examination without abnormal findings: Secondary | ICD-10-CM

## 2020-10-29 DIAGNOSIS — C5700 Malignant neoplasm of unspecified fallopian tube: Secondary | ICD-10-CM | POA: Diagnosis not present

## 2020-10-29 DIAGNOSIS — C569 Malignant neoplasm of unspecified ovary: Secondary | ICD-10-CM

## 2020-10-29 DIAGNOSIS — C5702 Malignant neoplasm of left fallopian tube: Secondary | ICD-10-CM

## 2020-10-29 DIAGNOSIS — D61818 Other pancytopenia: Secondary | ICD-10-CM

## 2020-10-29 DIAGNOSIS — Z5111 Encounter for antineoplastic chemotherapy: Secondary | ICD-10-CM | POA: Diagnosis not present

## 2020-10-29 DIAGNOSIS — Z7189 Other specified counseling: Secondary | ICD-10-CM

## 2020-10-29 DIAGNOSIS — Z95828 Presence of other vascular implants and grafts: Secondary | ICD-10-CM

## 2020-10-29 DIAGNOSIS — R030 Elevated blood-pressure reading, without diagnosis of hypertension: Secondary | ICD-10-CM | POA: Diagnosis not present

## 2020-10-29 DIAGNOSIS — C762 Malignant neoplasm of abdomen: Secondary | ICD-10-CM

## 2020-10-29 LAB — CMP (CANCER CENTER ONLY)
ALT: 11 U/L (ref 0–44)
AST: 13 U/L — ABNORMAL LOW (ref 15–41)
Albumin: 3.4 g/dL — ABNORMAL LOW (ref 3.5–5.0)
Alkaline Phosphatase: 88 U/L (ref 38–126)
Anion gap: 6 (ref 5–15)
BUN: 20 mg/dL (ref 8–23)
CO2: 25 mmol/L (ref 22–32)
Calcium: 9 mg/dL (ref 8.9–10.3)
Chloride: 105 mmol/L (ref 98–111)
Creatinine: 0.95 mg/dL (ref 0.44–1.00)
GFR, Estimated: >60 mL/min (ref >60)
Glucose, Bld: 127 mg/dL — ABNORMAL HIGH (ref 70–99)
Potassium: 4.3 mmol/L (ref 3.5–5.1)
Sodium: 136 mmol/L (ref 135–145)
Total Bilirubin: 0.4 mg/dL (ref 0.3–1.2)
Total Protein: 6.4 g/dL — ABNORMAL LOW (ref 6.5–8.1)

## 2020-10-29 LAB — CBC WITH DIFFERENTIAL (CANCER CENTER ONLY)
Abs Immature Granulocytes: 0.01 10*3/uL (ref 0.00–0.07)
Basophils Absolute: 0 10*3/uL (ref 0.0–0.1)
Basophils Relative: 0 %
Eosinophils Absolute: 0.1 10*3/uL (ref 0.0–0.5)
Eosinophils Relative: 2 %
HCT: 26.6 % — ABNORMAL LOW (ref 36.0–46.0)
Hemoglobin: 8.9 g/dL — ABNORMAL LOW (ref 12.0–15.0)
Immature Granulocytes: 0 %
Lymphocytes Relative: 25 %
Lymphs Abs: 0.8 10*3/uL (ref 0.7–4.0)
MCH: 33.2 pg (ref 26.0–34.0)
MCHC: 33.5 g/dL (ref 30.0–36.0)
MCV: 99.3 fL (ref 80.0–100.0)
Monocytes Absolute: 0.5 10*3/uL (ref 0.1–1.0)
Monocytes Relative: 16 %
Neutro Abs: 1.7 10*3/uL (ref 1.7–7.7)
Neutrophils Relative %: 57 %
Platelet Count: 206 10*3/uL (ref 150–400)
RBC: 2.68 MIL/uL — ABNORMAL LOW (ref 3.87–5.11)
RDW: 13 % (ref 11.5–15.5)
WBC Count: 3 10*3/uL — ABNORMAL LOW (ref 4.0–10.5)
nRBC: 0 % (ref 0.0–0.2)

## 2020-10-29 LAB — SAMPLE TO BLOOD BANK

## 2020-10-29 MED ORDER — PALONOSETRON HCL INJECTION 0.25 MG/5ML
INTRAVENOUS | Status: AC
  Filled 2020-10-29: qty 5

## 2020-10-29 MED ORDER — DIPHENHYDRAMINE HCL 50 MG/ML IJ SOLN
25.0000 mg | Freq: Once | INTRAMUSCULAR | Status: AC
  Administered 2020-10-29: 25 mg via INTRAVENOUS

## 2020-10-29 MED ORDER — SODIUM CHLORIDE 0.9% FLUSH
10.0000 mL | Freq: Once | INTRAVENOUS | Status: AC
  Administered 2020-10-29: 10 mL
  Filled 2020-10-29: qty 10

## 2020-10-29 MED ORDER — PALONOSETRON HCL INJECTION 0.25 MG/5ML
0.2500 mg | Freq: Once | INTRAVENOUS | Status: AC
  Administered 2020-10-29: 0.25 mg via INTRAVENOUS

## 2020-10-29 MED ORDER — DIPHENHYDRAMINE HCL 50 MG/ML IJ SOLN
INTRAMUSCULAR | Status: AC
  Filled 2020-10-29: qty 1

## 2020-10-29 MED ORDER — SODIUM CHLORIDE 0.9 % IV SOLN
290.0000 mg | Freq: Once | INTRAVENOUS | Status: AC
Start: 2020-10-29 — End: 2020-10-29
  Administered 2020-10-29: 290 mg via INTRAVENOUS
  Filled 2020-10-29: qty 29

## 2020-10-29 MED ORDER — SODIUM CHLORIDE 0.9% FLUSH
10.0000 mL | INTRAVENOUS | Status: DC | PRN
  Administered 2020-10-29: 10 mL
  Filled 2020-10-29: qty 10

## 2020-10-29 MED ORDER — FOSAPREPITANT DIMEGLUMINE INJECTION 150 MG
150.0000 mg | Freq: Once | INTRAVENOUS | Status: AC
  Administered 2020-10-29: 150 mg via INTRAVENOUS
  Filled 2020-10-29: qty 150

## 2020-10-29 MED ORDER — FAMOTIDINE IN NACL 20-0.9 MG/50ML-% IV SOLN
INTRAVENOUS | Status: AC
  Filled 2020-10-29: qty 50

## 2020-10-29 MED ORDER — DOXORUBICIN HCL LIPOSOMAL CHEMO INJECTION 2 MG/ML
24.0000 mg/m2 | Freq: Once | INTRAVENOUS | Status: AC
  Administered 2020-10-29: 42 mg via INTRAVENOUS
  Filled 2020-10-29: qty 21

## 2020-10-29 MED ORDER — SODIUM CHLORIDE 0.9 % IV SOLN
10.0000 mg | Freq: Once | INTRAVENOUS | Status: AC
  Administered 2020-10-29: 10 mg via INTRAVENOUS
  Filled 2020-10-29: qty 10

## 2020-10-29 MED ORDER — HEPARIN SOD (PORK) LOCK FLUSH 100 UNIT/ML IV SOLN
500.0000 [IU] | Freq: Once | INTRAVENOUS | Status: AC | PRN
  Administered 2020-10-29: 500 [IU]
  Filled 2020-10-29: qty 5

## 2020-10-29 MED ORDER — DEXTROSE 5 % IV SOLN
Freq: Once | INTRAVENOUS | Status: AC
  Filled 2020-10-29: qty 250

## 2020-10-29 MED ORDER — FAMOTIDINE IN NACL 20-0.9 MG/50ML-% IV SOLN
20.0000 mg | Freq: Once | INTRAVENOUS | Status: AC
  Administered 2020-10-29: 20 mg via INTRAVENOUS

## 2020-10-29 NOTE — Progress Notes (Signed)
Chief Complaint   Patient presents with   . Follow-up   . Labs     Patient presents in office today for f/u and fasting labs.  Has c/o feeling what feels like a vibration in her chest.  Would like to discuss getting a handicap placard.  Also has c/o decreased sense of smell for the last month.  States that she has not been sick.  Also has c/o lower back pain for a few years.  Treating with ibuprofen with some relief.  No other concerns.    1. Have you been to the ER, urgent care clinic since your last visit?  Hospitalized since your last visit?No    2. Have you seen or consulted any other health care providers outside of the [facility] since your last visit?  Include any pap smears or colon screening. No    Learning Assessment 05/06/2013   PRIMARY LEARNER Patient   HIGHEST LEVEL OF EDUCATION - PRIMARY LEARNER  SOME COLLEGE   BARRIERS PRIMARY LEARNER NONE   CO-LEARNER CAREGIVER No   PRIMARY LANGUAGE ENGLISH   INTERPRETER NEED No   LEARNER PREFERENCE PRIMARY READING     DEMONSTRATION     LISTENING   LEARNING SPECIAL TOPICS n/a   ANSWERED BY patient   RELATIONSHIP SELF

## 2020-10-29 NOTE — Progress Notes (Signed)
Progress Note    she is a 80 y.o. year old female who presents for evalution.    Subjective:   Patient here for follow-up on her type 2 diabetes.  This has been well controlled previous hemoglobin A1c last summer was 6.8.  Currently diet controlled.  She is on atorvastatin for her cholesterol tolerating this.    UTD on mammo, breast Ca in remission.      Has son with schizoaffective d/o and this causes quite a bit of stress which causes depression.  She is not interested in medication for this.    States she had a vibration sensation in her chest checked pulse and was regular and normal.  Lasted a few seconds each episode.  Happened in her legs as well.  No CP.  Pt lost sense of smell in past year, not really bothering her, denies having gotten sick.  She is vaccinated for covid with booster.  Discussed would be pos if we checked IGG, she is not interested in ENT referral no stuffy nose.      Chronic back pain and does not like taking pills but motrin 400 does help.  Tried PT and did not help.  Needs handicap placard can't walk 200 feet without stopping to rest.  Reviewed PmHx, RxHx, FmHx, SocHx, AllgHx and updated and dated in the chart.    Review of Systems - negative except as listed above in the HPI    Objective:     Vitals:    10/29/20 0909   BP: 119/71   Pulse: 65   Resp: 18   Temp: 97.7 ??F (36.5 ??C)   TempSrc: Oral   SpO2: 97%   Weight: 155 lb (70.3 kg)   Height: 5\' 3"  (1.6 m)       Current Outpatient Medications   Medication Sig   ??? ergocalciferol (Vitamin D2) 1,250 mcg (50,000 unit) capsule TAKE 1 CAPSULE EVERY 7 DAYS   ??? atorvastatin (LIPITOR) 40 mg tablet TAKE 1 TABLET DAILY BEFORE BREAKFAST   ??? Benicar HCT 20-12.5 mg per tablet TAKE 1 TABLET DAILY   ??? alendronate (FOSAMAX) 35 mg tablet TAKE 1 TABLET EVERY 7 DAYS   ??? mupirocin (BACTROBAN) 2 % ointment Apply  to affected area four (4) times daily.   ??? krill-om-3-dha-epa-phospho-ast (MEGARED OMEGA-3 KRILL OIL) 1,000-230-60 mg cap Take 1 Tab by  mouth two (2) times a day.   ??? brimonidine-timolol (COMBIGAN) 0.2-0.5 % drop ophthalmic solution Administer 1 Drop to both eyes every twelve (12) hours.   ??? aspirin 81 mg chewable tablet Take 81 mg by mouth daily.   ??? metFORMIN ER (GLUCOPHAGE XR) 500 mg tablet Take 1 Tab by mouth daily (with dinner). (Patient not taking: Reported on 05/13/2020)   ??? anastrozole (ARIMIDEX) 1 mg tablet Take 1 mg by mouth daily. (Patient not taking: Reported on 05/13/2020)   ??? Cetirizine (ZYRTEC) 10 mg cap Take  by mouth as needed. (Patient not taking: Reported on 10/29/2020)     No current facility-administered medications for this visit.       Physical Examination: General appearance - alert, well appearing, and in no distress  Mental status - alert, oriented to person, place, and time  Nose - normal and patent, no erythema, discharge or polyps  Chest - clear to auscultation, no wheezes, rales or rhonchi, symmetric air entry  Heart - normal rate, regular rhythm, normal S1, S2, no murmurs, rubs, clicks or gallops      Assessment/ Plan:   Diagnoses  and all orders for this visit:    1. Medicare annual wellness visit, subsequent  -     CBC WITH AUTOMATED DIFF; Future    2. Mild episode of recurrent major depressive disorder ([place])    3. Malignant neoplasm of upper-inner quadrant of right breast in female, estrogen receptor positive ([place])    4. Controlled type 2 diabetes mellitus without complication, without long-term current use of insulin ([place])  -     LIPID PANEL; Future  -     METABOLIC PANEL, COMPREHENSIVE; Future  -     HEMOGLOBIN A1C WITH EAG; Future  -     MICROALBUMIN, UR, RAND W/ MICROALB/CREAT RATIO; Future    5. Chronic bilateral low back pain without sciatica  Handicap placard form completed  6. Anosmia  Discussed ENT referral patient on his time.  7. Abnormal sensation of leg  Possible fasciculation.  Patient will monitor     Follow-up and Dispositions    ?? Return in about 6 months (around 04/28/2021), or if symptoms worsen or  fail to improve.           I have discussed the diagnosis with the patient and the intended plan as seen in the above orders.  The patient has received an after-visit summary and questions were answered concerning future plans. Pt conveyed understanding of plan.    Medication Side Effects and Warnings were discussed with patient    An electronic signature was used to authenticate this note  Esau Grew, DO    This is the Subsequent Medicare Annual Wellness Exam, performed 12 months or more after the Initial AWV or the last Subsequent AWV    I have reviewed the patient's medical history in detail and updated the computerized patient record.       Assessment/Plan   Education and counseling provided:  Are appropriate based on today's review and evaluation    1. Medicare annual wellness visit, subsequent  -     CBC WITH AUTOMATED DIFF; Future  2. Mild episode of recurrent major depressive disorder ([place])  3. Malignant neoplasm of upper-inner quadrant of right breast in female, estrogen receptor positive ([place])  4. Controlled type 2 diabetes mellitus without complication, without long-term current use of insulin ([place])  -     LIPID PANEL; Future  -     METABOLIC PANEL, COMPREHENSIVE; Future  -     HEMOGLOBIN A1C WITH EAG; Future  -     MICROALBUMIN, UR, RAND W/ MICROALB/CREAT RATIO; Future  5. Chronic bilateral low back pain without sciatica  6. Anosmia  7. Abnormal sensation of leg       Depression Risk Factor Screening     3 most recent PHQ Screens 10/29/2020   PHQ Not Done -   Little interest or pleasure in doing things Not at all   Feeling down, depressed, irritable, or hopeless Not at all   Total Score PHQ 2 0       Alcohol & Drug Abuse Risk Screen    Do you average more than 1 drink per night or more than 7 drinks a week:  No    On any one occasion in the past three months have you have had more than 3 drinks containing alcohol:  No          Functional Ability and Level of Safety    Hearing: Hearing is good.       Activities of Daily Living:  The home contains: grab bars  Patient does total self care      Ambulation: diff walking due to back     Fall Risk:  Fall Risk Assessment, last 12 mths 01/02/2020   Able to walk? Yes   Fall in past 12 months? 0   Do you feel unsteady? 0   Are you worried about falling 0   Number of falls in past 12 months -   Fall with injury? -      Abuse Screen:  Patient is not abused       Cognitive Screening    Has your family/caregiver stated any concerns about your memory: no         Health Maintenance Due     Health Maintenance Due   Topic Date Due   ??? Eye Exam Retinal or Dilated  Never done   ??? Foot Exam Q1  10/27/2019   ??? Flu Vaccine (1) 06/17/2020   ??? MICROALBUMIN Q1  07/30/2020       Patient Care Team   Patient Care Team:  Esau Grew, DO as PCP - General (Family Medicine)  Esau Grew, DO as PCP - [facility] Empaneled Provider  Joyce Gross, MD (Hematology and Oncology)  Lyndel Safe, MD as Physician (Radiation Oncology)    History     Patient Active Problem List   Diagnosis Code   ??? Breast cancer, right ([place]) C50.911   ??? Postmenopausal Z78.0   ??? Hypovitaminosis D E55.9   ??? Essential hypertension I10   ??? Mixed hyperlipidemia E78.2   ??? Elevated fasting glucose R73.01   ??? Controlled type 2 diabetes mellitus without complication, without long-term current use of insulin ([facility]) E11.9     Past Medical History:   Diagnosis Date   ??? Breast CA ([place]) 2014    Right - Lumpectomy    ??? Diabetes ([place])    ??? Glaucoma    ??? Hx of mammogram 02/05/2016    Negative per pt. Sees Dr. Laurena Bering    ??? Hyperlipemia    ??? Hypertension    ??? 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 Other ill-defined conditions(799.89) 2016    Mild URI x 4 months (allergies)    ??? Radiation therapy complication 6433   ??? Routine Papanicolaou smear 1996    Negative per pt.    ??? Skin cancer     Basal cell on left side of nose and upper lip      Past Surgical History:   Procedure Laterality Date   ??? HX BREAST BIOPSY Left     x 2  negative biopsies    ??? HX BREAST LUMPECTOMY Right 2014   ??? HX CATARACT REMOVAL Bilateral     w/ IOL implants   ??? HX TAH AND BSO  1997    for rapidly growing fibroid ([facility]) - benign.  Dr. Oran Rein.   ??? HX TUBAL LIGATION  1972   ??? PR BREAST SURGERY PROCEDURE UNLISTED Right 2014    Right breast lumpectomy     Current Outpatient Medications   Medication Sig Dispense Refill   ??? ergocalciferol (Vitamin D2) 1,250 mcg (50,000 unit) capsule TAKE 1 CAPSULE EVERY 7 DAYS 12 Capsule 3   ??? atorvastatin (LIPITOR) 40 mg tablet TAKE 1 TABLET DAILY BEFORE BREAKFAST 90 Tablet 3   ??? Benicar HCT 20-12.5 mg per tablet TAKE 1 TABLET DAILY 90 Tablet 3   ??? alendronate (FOSAMAX) 35 mg tablet TAKE 1 TABLET EVERY 7 DAYS 12  Tab 4   ??? mupirocin (BACTROBAN) 2 % ointment Apply  to affected area four (4) times daily. 22 g 0   ??? krill-om-3-dha-epa-phospho-ast (MEGARED OMEGA-3 KRILL OIL) 1,000-230-60 mg cap Take 1 Tab by mouth two (2) times a day.     ??? brimonidine-timolol (COMBIGAN) 0.2-0.5 % drop ophthalmic solution Administer 1 Drop to both eyes every twelve (12) hours.     ??? aspirin 81 mg chewable tablet Take 81 mg by mouth daily.     ??? metFORMIN ER (GLUCOPHAGE XR) 500 mg tablet Take 1 Tab by mouth daily (with dinner). (Patient not taking: Reported on 05/13/2020) 90 Tab 3   ??? anastrozole (ARIMIDEX) 1 mg tablet Take 1 mg by mouth daily. (Patient not taking: Reported on 05/13/2020) 90 Tab 1   ??? Cetirizine (ZYRTEC) 10 mg cap Take  by mouth as needed. (Patient not taking: Reported on 10/29/2020)       Allergies   Allergen Reactions   ??? Metformin Nausea Only     Stomach upset   ??? Shampoo & Body Wash [Skin Cleanser,General] Itching     Paul mitchell       Family History   Problem Relation Age of Onset   ??? Cancer Mother 84        Ovarian - Passed away in 01/13/1965   ??? Ovarian Cancer Mother 75   ??? Other Son 3        Pancreatitis    ??? Cancer Paternal Aunt 37        Pancreatic     Social History     Tobacco Use   ??? Smoking status: Former Smoker     Packs/day: 1.00      Years: 30.00     Pack years: 30.00     Quit date: 04/17/1996     Years since quitting: 24.5   ??? Smokeless tobacco: Never Used   ??? Tobacco comment: Never used vapor or e-cigs    Substance Use Topics   ??? Alcohol use: Yes     Alcohol/week: 11.7 standard drinks     Types: 14 Standard drinks or equivalent per week     Comment: 1 gin and tonics per day         Esau Grew, DO

## 2020-10-29 NOTE — Progress Notes (Signed)
Your kidney function has declined quite a bit.  Otherwise your labs are normal.  I placed an order to recheck your creatinine and I would like you to get this done in 1 to 2 weeks.  Ensure good hydration.  Your diabetes is well controlled and has improved.  Lets recheck that in 6 months.

## 2020-10-29 NOTE — Progress Notes (Signed)
Called and spoke with patient in reference to labs. Patient verbalized understanding.

## 2020-10-29 NOTE — Assessment & Plan Note (Signed)
Recent imaging show positive response to therapy The dose of her chemotherapy was recently adjusted We will continue treatment once a month I plan to repeat imaging study in 2 weeks for further assessment If she had positive response to therapy, we will add a few more cycles and I will order echocardiogram next month

## 2020-10-29 NOTE — Assessment & Plan Note (Signed)
She has mild worsening pancytopenia So far, she is not symptomatic She doesn't need transfusion support today

## 2020-10-29 NOTE — Assessment & Plan Note (Signed)
Her blood pressure is noted to be trending up and down She has not taken any ibuprofen recently Observe closely for now  I do not feel strongly she needs to be placed on blood pressure medication

## 2020-10-29 NOTE — Progress Notes (Signed)
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 The dose of her chemotherapy was recently adjusted We will continue treatment once a month I plan to repeat imaging study in 2 weeks for further assessment If she had positive response to therapy, we will add a few more cycles and I will order echocardiogram next month  Pancytopenia, acquired ([name]) She has mild worsening pancytopenia So far, she is not symptomatic She doesn't need transfusion support today  Elevated BP without diagnosis of hypertension Her blood pressure is noted to be trending up and down She has not taken any ibuprofen recently Observe closely for now  I do not feel strongly she needs to be placed on blood pressure medication   Orders Placed This Encounter  Procedures  . CT CHEST ABDOMEN PELVIS W CONTRAST    Standing Status:   Future    Standing Expiration Date:   10/29/2021    Order Specific Question:   Preferred imaging location?    Answer:   [name]    Order Specific Question:   Radiology Contrast Protocol - do NOT remove file path    Answer:   [email or web address]    All questions were answered. The patient knows to call the clinic with any problems, questions or concerns. The total time spent in the appointment was 25 minutes encounter with patients including review of chart and various tests results, discussions about plan of care and coordination of care plan   Jessica Lark, MD 10/29/2020 9:35 AM  INTERVAL HISTORY: Please see below for problem oriented charting. 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 Extensive omental nodularity highly worrisome for peritoneal carcinomatosis. Late recurrence of melanoma can present as peritoneal carcinomatosis. Ovarian cancer more commonly presents in this manner. Patient does have a predominately low density right adnexal lesion measuring up to 3.5 cm, although this lesion is not typical for ovarian cancer. 2. No evidence of bowel or ureteral obstruction. 3. No other definite signs of metastatic disease. There are small indeterminate low-density hepatic lesions.   02/08/2016 Tumor Marker   Patient's tumor was tested for the following markers: CA-125 Results of the tumor marker test revealed 656.2   02/15/2016 Procedure   CT-guided core biopsy performed of omental mass just deep to the abdominal wall.   02/15/2016 Pathology Results   Omentum, biopsy, left - PAPILLARY SEROUS NEOPLASM, SEE COMMENT. Microscopic Comment There are papillary collections of largely low grade appearing cells with numerous psammoma bodies. Given the limited material it is difficult to distinguish between invasive implants of a serous borderline tumor and serous carcinoma, especially given the low grade appearance.   02/18/2016 Pathology Results   1. Omentum, resection for tumor INVASIVE IMPLANT OF HIGH GRADE SEROUS CARCINOMA 2. Uterus +/- tubes/ovaries, neoplastic HIGH GRADE SEROUS CARCINOMA  INVOLVING LEFT TUBAL FIMBRIA SEROUS CARCINOMA WITH PREDOMINANT PSAMMOMA BODIES IMPLANT AT UTERUS SEROSA, BILATERAL FALLOPIAN TUBAL SEROSA, AND ANTERIOR PERITONEAL REFLECTION CERVIX: HISTOLOGICAL  UNREMARKABLE ENDOMETRIUM: INACTIVE ENDOMETRIUM MYOMETRIUM: LEIOMYOMA LEFT OVARY: CYSTADENOFIBROMA RIGHT OVARY AND FALLOPIAN TUBE: HISTOLOGICAL UNREMARKABLE Microscopic Comment 2. ONCOLOGY TABLE - FALLOPIAN TUBE 1. Specimen, including laterality: Omentum, uterus, bilateral ovaries and fallopian tubes 2. Procedure: Hysterectomy, bilateral salpingo-oophorectomy and tumor debulking omentectomy 3. Lymph node sampling performed: No 4. Tumor site: uterus serosa, bilateral fallopian tubal serosa, peritoneal and omentum 5. Tumor location in fallopian tube: Left fallopian tubal fimbria 6. Specimen integrity (intact/ruptured/disrupted): Intact 7. Tumor size (cm): multi focal invasive omentum implant greater than 2 cm, left fallopian tube tumor 0.8 cm. 8. Histologic type: Serous carcinoma 9. Grade: 3 10. Microscopic tumor extension: uterus serosa, bilateral fallopian tubal serosa, peritoneal and omentum 11. Margins: NA 12. Lymph-Vascular invasion: identified 13. Lymph nodes: # examined: 0; # positive: NA 14. TNM: pT3c, pNx 15. FIGO Stage (based on pathologic findings, needs clinical correlation: IIIC 16. Comment: High grade serous carcinoma multifocally and extensively involves left fallopian tubal fimbria, omentum, uterus serosa, bilateral fallopian tubal serosa and peritoneal. At the left fallopian tube there are small foci of serous tubal intraepithelial carcinoma identified, so we conclude the carcinoma is fallopian tube origin   03/09/2016 Tumor Marker   Patient's tumor was tested for the following markers: CA-125 Results of the tumor marker test revealed 154.4   03/15/2016 Procedure   Technically successful right IJ power-injectable port catheter placement. Ready for routine use.   03/18/2016 - 07/13/2016 Chemotherapy   The patient had 6 cycles of carboplatin and taxol   04/14/2016 Tumor Marker   Patient's tumor was tested for the following markers: CA-125 Results of the tumor marker test  revealed 36.7   04/25/2016 Genetic Testing   Patient has genetic testing done for germline mutation Results revealed patient has no mutation   04/28/2016 Tumor Marker   Patient's tumor was tested for the following markers: CA-125 Results of the tumor marker test revealed 24.6   06/21/2016 Tumor Marker   Patient's tumor was tested for the following markers: CA-125 Results of the tumor marker test revealed 16.6   08/01/2016 Tumor Marker   Patient's tumor was tested for the following markers: CA-125 Results of the tumor marker test revealed 17.2   08/05/2016 Imaging   Interval TAH-BSO. No evidence of residual pelvic mass or metastatic disease within the abdomen or pelvis. No other acute findings.    11/04/2016 Tumor Marker   Patient's tumor was tested for the following markers: CA-125 Results of the tumor marker test revealed 13.6   01/20/2017 Tumor Marker   Patient's tumor was tested for the following markers: CA-125 Results of the tumor marker test revealed 13.8   04/14/2017 Tumor Marker   Patient's tumor was tested for the following markers: CA-125 Results of the tumor marker test revealed 16.1   06/13/2017 Imaging   No acute findings.  No mass or hernia identified.  Colonic diverticulosis, without radiographic evidence of diverticulitis.  Aortic atherosclerosis.   07/03/2017 Tumor Marker   Patient's tumor was tested for the following markers: CA-125 Results of the tumor marker test revealed 18.4   09/19/2017 Tumor Marker   Patient's tumor was tested for the following markers: CA-125 Results of the tumor marker test revealed 18.5   01/23/2018 Tumor Marker   Patient's tumor was tested for the following markers: CA-125 Results of the tumor marker test revealed 35.4   01/30/2018 Imaging  1. No evidence of local fallopian tube carcinoma recurrence within the pelvis. Post hysterectomy. 2. No evidence of metastatic peritoneal disease or omental disease. No solid organ  metastasis.    03/20/2018 Tumor Marker   Patient's tumor was tested for the following markers: CA-125 Results of the tumor marker test revealed 73   05/02/2018 Tumor Marker   Patient's tumor was tested for the following markers: CA-125 Results of the tumor marker test revealed 127.1   05/08/2018 Imaging   CT abdomen and pelvis 1. 7 cm left subdiaphragmatic collection of loculated fluid versus low-density soft tissue. Given the patient's history of rising CA 125, metastatic disease considered highly likely.  2. Areas of fluid or recurrent disease identified in the right lower quadrant adjacent to the cecum and along right lower quadrant small bowel loops.   05/14/2018 Tumor Marker   Patient's tumor was tested for the following markers: CA-125 Results of the tumor marker test revealed 166    Genetic Testing   Patient has genetic testing done for ER/PR and MMR. Results revealed patient has the following on 02/18/2016 surgical pathology: ER 20%, PR 0% MMR: normal    Genetic Testing   Patient has genetic testing done for MSI. Results revealed patient has the following: MSI stable   05/18/2018 - 12/31/2018 Chemotherapy   The patient had carboplatin and Doxil   07/16/2018 Tumor Marker   Patient's tumor was tested for the following markers: CA-125 Results of the tumor marker test revealed 28.7   08/10/2018 Imaging   CT imaging:  Decreased size of peritoneal soft tissue masses in left upper quadrant, consistent with decreased metastatic disease.  No new or progressive metastatic disease. No other acute findings.  Colonic diverticulosis, without radiographic evidence of diverticulitis.  Stable small paraumbilical ventral hernia containing transverse colon.   08/13/2018 Tumor Marker   Patient's tumor was tested for the following markers: CA-125 Results of the tumor marker test revealed 21.6   08/24/2018 Echocardiogram   LV EF: 60% -  65%   10/15/2018 Tumor Marker   Patient's  tumor was tested for the following markers: CA-125 Results of the tumor marker test revealed 20.1   10/19/2018 Imaging   Ct scan of abdomen and pelvis Status post hysterectomy, bilateral salpingo-oophorectomy, and omentectomy.  Two peritoneal implants in the left upper abdomen are stable versus mildly decreased, as above. No abdominopelvic ascites.  No evidence of new/progressive metastatic disease.     11/19/2018 Tumor Marker   Patient's tumor was tested for the following markers: CA-125 Results of the tumor marker test revealed 21.7   11/23/2018 Echocardiogram   1. The left ventricle has normal systolic function of 01-74%. The cavity size was normal. There is no increased left ventricular wall thickness. Echo evidence of impaired diastolic relaxation.  2. The right ventricle has normal systolic function. The cavity was normal. There is no increase in right ventricular wall thickness.  3. The mitral valve is normal in structure. There is mild mitral annular calcification present.  4. The tricuspid valve is normal in structure.  5. The aortic valve is tricuspid There is mild thickening of the aortic valve.  6. The pulmonic valve was normal in structure. Pulmonic valve regurgitation is mild by color flow Doppler.  7. Normal LV systolic function; mild diastolic dysfunction.   12/31/2018 Tumor Marker   Patient's tumor was tested for the following markers: CA-125 Results of the tumor marker test revealed 17.1   01/31/2019 Imaging   1. Left upper quadrant peritoneal  implants described previously have decreased in the interval. No new or progressive findings in the abdomen/pelvis today. No free fluid. 2. Right paraumbilical ventral hernia contains a small knuckle of transverse colon without complicating features. 3.  Aortic Atherosclerois (ICD10-170.0)  Aortic Atherosclerosis (ICD10-I70.0).   01/31/2019 Tumor Marker   Patient's tumor was tested for the following markers: CA-125 Results  of the tumor marker test revealed 18.9   02/11/2019 - 03/13/2019 Chemotherapy   The patient is taking Niraparib. She self discontinued after 1 month   02/25/2019 Tumor Marker   Patient's tumor was tested for the following markers: CA-125 Results of the tumor marker test revealed 18.6   05/13/2019 Tumor Marker   Patient's tumor was tested for the following markers: CA-125 Results of the tumor marker test revealed 230   05/22/2019 Imaging   1. Increased size of 4.6 cm soft tissue mass in the gastrosplenic ligament, consistent with metastatic disease. 2. No other sites of metastatic disease identified within the abdomen or pelvis. 3. Colonic diverticulosis. No radiographic evidence of diverticulitis. 4. Stable small right paraumbilical hernia containing transverse colon.   Aortic Atherosclerosis (ICD10-I70.0).   05/31/2019 - 08/22/2019 Chemotherapy   The patient had bevacizumab and Taxol for chemotherapy treatment.     05/31/2019 Tumor Marker   Patient's tumor was tested for the following markers: CA-125 Results of the tumor marker test revealed 84   06/21/2019 Tumor Marker   Patient's tumor was tested for the following markers: CA-125 Results of the tumor marker test revealed 49.4.   07/19/2019 Tumor Marker   Patient's tumor was tested for the following markers: CA-125. Results of the tumor marker test revealed 44.5   08/22/2019 Imaging   1. Increase in size of 5.0 cm mass in the gastro splenic ligament consistent with metastatic disease. 2. No additional sites of disease identified within the abdomen or pelvis. 3. Unchanged paraumbilical hernia containing nonobstructed loop of large bowel. 4.  Aortic Atherosclerosis (ICD10-I70.0).     08/30/2019 Tumor Marker   Patient's tumor was tested for the following markers: CA-125 Results of the tumor marker test revealed 71.5   08/30/2019 - 05/11/2020 Chemotherapy   The patient had gemzar for chemotherapy treatment.     09/27/2019 Tumor  Marker   Patient's tumor was tested for the following markers: CA-125. Results of the tumor marker test revealed 38.5   10/14/2019 Tumor Marker   Patient's tumor was tested for the following markers: CA-125 Results of the tumor marker test revealed 40.8   10/21/2019 Tumor Marker   Patient's tumor was tested for the following markers: CA-125 Results of the tumor marker test revealed 34.8.   11/22/2019 Imaging   1. Interval decrease in size of left upper quadrant cystic and solid peritoneal lesion. 2. No new sites of disease. 3. Unchanged periumbilical hernia containing a nonobstructed loop of small bowel. 4.  Aortic Atherosclerosis (ICD10-I70.0).     11/25/2019 Tumor Marker   Patient's tumor was tested for the following markers: CA-125 Results of the tumor marker test revealed 22.7   12/23/2019 Tumor Marker   Patient's tumor was tested for the following markers: CA-125 Results of the tumor marker test revealed 26.3   01/20/2020 Tumor Marker   Patient's tumor was tested for the following markers: CA-125 Results of the tumor marker test revealed 28.3   02/14/2020 Imaging   1. 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 2. No new or progressive disease identified within the abdomen or pelvis.  3. Colonic diverticulosis. No radiographic evidence of diverticulitis. 4. Small paraumbilical ventral hernia.     02/17/2020 Tumor Marker   Patient's tumor was tested for the following markers: CA-125 Results of the tumor marker test revealed 40.1   05/11/2020 Tumor Marker   Patient's tumor was tested for the following markers: CA-125 Results of the tumor marker test revealed 113.   05/22/2020 Imaging   1. 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 2. No evidence of ascites, bowel or ureteral obstruction. 3. Chronic right periumbilical hernia  containing a portion of the transverse colon. No evidence of incarceration or obstruction. 4. Aortic Atherosclerosis (ICD10-I70.0).   05/22/2020 Tumor Marker   Patient's tumor was tested for the following markers: CA-125 Results of the tumor marker test revealed 118   05/29/2020 Echocardiogram   1. Normal GLS -19.8. Left ventricular ejection fraction, by estimation, is 55 to 60%. The left ventricle has normal function. The left ventricle has no regional wall motion abnormalities. Left ventricular diastolic parameters were normal.  2. Right ventricular systolic function is normal. The right ventricular size is normal.  3. The mitral valve is degenerative. Trivial mitral valve regurgitation. No evidence of mitral stenosis.  4. The aortic valve is tricuspid. Aortic valve regurgitation is not visualized. Mild to moderate aortic valve sclerosis/calcification is present, without any evidence of aortic stenosis.  5. The inferior vena cava is normal in size with greater than 50% respiratory variability, suggesting right atrial pressure of 3 mmHg.   06/01/2020 -  Chemotherapy   The patient had carboplatin and doxil for chemotherapy treatment.     06/01/2020 Tumor Marker   Patient's tumor was tested for the following markers: CA-125 Results of the tumor marker test revealed 126.   06/29/2020 Tumor Marker   Patient's tumor was tested for the following markers: CA-125. Results of the tumor marker test revealed 108   07/27/2020 Tumor Marker   Patient's tumor was tested for the following markers: CA-125. Results of the tumor marker test revealed 89.1   08/18/2020 Imaging   1. Interval positive response to therapy. 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 No new or progressive metastatic disease in the abdomen or pelvis. 2. Chronic findings include: Small hiatal hernia. Mild to moderate colonic diverticulosis. Aortic Atherosclerosis  (ICD10-I70.0).   08/24/2020 Tumor Marker   Patient's tumor was tested for the following markers: CA-125 Results of the tumor marker test revealed 81.2   08/25/2020 Echocardiogram   1. Left ventricular ejection fraction, by estimation, is 60 to 65%. The left ventricle has normal function. The left ventricle has no regional wall motion abnormalities. Left ventricular diastolic parameters were normal. The average left ventricular global longitudinal strain is -21.4 %. The global longitudinal strain is normal.  2. Right ventricular systolic function is normal. The right ventricular size is normal.  3. The mitral valve is normal in structure. No evidence of mitral valve regurgitation. No evidence of mitral stenosis.  4. The aortic valve is tricuspid. There is moderate calcification of the aortic valve. There is moderate thickening of the aortic valve. Aortic valve regurgitation is not visualized. Mild to moderate aortic valve sclerosis/calcification is present, without any evidence of aortic stenosis.  5. Aortic dilatation noted. There is mild dilatation of the aortic root, measuring 37 mm.  6. The inferior vena cava is normal in size with greater than 50% respiratory variability,  suggesting right atrial pressure of 3 mmHg.   09/21/2020 Tumor Marker   Patient's tumor was tested for the following markers: CA-125 Results of the tumor marker test revealed 72.7.   10/01/2020 Tumor Marker   Patient's tumor was tested for the following markers: CA-125 Results of the tumor marker test revealed 80.9     REVIEW OF SYSTEMS:   Constitutional: Denies fevers, chills or abnormal weight loss Eyes: Denies blurriness of vision Ears, nose, mouth, throat, and face: Denies mucositis or sore throat Respiratory: Denies cough, dyspnea or wheezes Cardiovascular: Denies palpitation, chest discomfort or lower extremity swelling Gastrointestinal:  Denies nausea, heartburn or change in bowel habits Skin: Denies abnormal  skin rashes Lymphatics: Denies new lymphadenopathy or easy bruising Neurological:Denies numbness, tingling or new weaknesses Behavioral/Psych: Mood is stable, no new changes  All other systems were reviewed with the patient and are negative.  I have reviewed the past medical history, past surgical history, social history and family history with the patient and they are unchanged from previous note.  ALLERGIES:  has No Known Allergies.  MEDICATIONS:  Current Outpatient Medications  Medication Sig Dispense Refill  . Cholecalciferol (VITAMIN D) 2000 units CAPS Take 1 capsule by mouth daily.    Marland Kitchen ezetimibe (ZETIA) 10 MG tablet Take 10 mg by mouth daily.    Marland Kitchen lidocaine-prilocaine (EMLA) cream Apply to affected area once 30 g 3  . ondansetron (ZOFRAN) 8 MG tablet Take 1 tablet (8 mg total) by mouth 2 (two) times daily as needed (Nausea or vomiting). 30 tablet 1  . prochlorperazine (COMPAZINE) 10 MG tablet Take 1 tablet (10 mg total) by mouth every 6 (six) hours as needed (Nausea or vomiting). 60 tablet 1   No current facility-administered medications for this visit.   Facility-Administered Medications Ordered in Other Visits  Medication Dose Route Frequency Provider Last Rate Last Admin  . CARBOplatin (PARAPLATIN) 290 mg in sodium chloride 0.9 % 250 mL chemo infusion  290 mg Intravenous Once Alvy Bimler, Laurella Tull, MD      . dexamethasone (DECADRON) 10 mg in sodium chloride 0.9 % 50 mL IVPB  10 mg Intravenous Once Jessica Lark, MD 204 mL/hr at 10/29/20 0921 10 mg at 10/29/20 0921  . DOXOrubicin HCL LIPOSOMAL (DOXIL) 42 mg in dextrose 5 % 250 mL chemo infusion  24 mg/m2 (Treatment Plan Recorded) Intravenous Once Alvy Bimler, Idabell Picking, MD      . famotidine (PEPCID) IVPB 20 mg premix  20 mg Intravenous Once Alvy Bimler, Autumm Hattery, MD      . fosaprepitant (EMEND) 150 mg in sodium chloride 0.9 % 145 mL IVPB  150 mg Intravenous Once Jessica Lark, MD 450 mL/hr at 10/29/20 0924 150 mg at 10/29/20 0924  . heparin lock flush 100 unit/mL   500 Units Intracatheter Once PRN Alvy Bimler, Lanina Larranaga, MD      . sodium chloride flush (NS) 0.9 % injection 10 mL  10 mL Intracatheter PRN Alvy Bimler, Sherlyne Crownover, MD        PHYSICAL EXAMINATION: ECOG PERFORMANCE STATUS: 1 - Symptomatic but completely ambulatory  Vitals:   10/29/20 0831  BP: (!) 163/75  Pulse: 78  Resp: 18  Temp: (!) 97.4 F (36.3 C)  SpO2: 100%   Filed Weights   10/29/20 0831  Weight: 149 lb 3.2 oz (67.7 kg)    GENERAL:alert, no distress and comfortable SKIN: skin color, texture, turgor are normal, no rashes or significant lesions EYES: normal, Conjunctiva are pink and non-injected, sclera clear OROPHARYNX:no exudate, no erythema and lips, buccal mucosa, and  tongue normal  NECK: supple, thyroid normal size, non-tender, without nodularity LYMPH:  no palpable lymphadenopathy in the cervical, axillary or inguinal LUNGS: clear to auscultation and percussion with normal breathing effort HEART: regular rate & rhythm and no murmurs and no lower extremity edema ABDOMEN:abdomen soft, non-tender and normal bowel sounds Musculoskeletal:no cyanosis of digits and no clubbing  NEURO: alert & oriented x 3 with fluent speech, no focal motor/sensory deficits  LABORATORY DATA:  I have reviewed the data as listed    Component Value Date/Time   NA 136 10/29/2020 0752   NA 139 06/07/2017 1430   K 4.3 10/29/2020 0752   K 3.8 06/07/2017 1430   CL 105 10/29/2020 0752   CO2 25 10/29/2020 0752   CO2 27 06/07/2017 1430   GLUCOSE 127 (H) 10/29/2020 0752   GLUCOSE 124 06/07/2017 1430   BUN 20 10/29/2020 0752   BUN 14.0 06/07/2017 1430   CREATININE 0.95 10/29/2020 0752   CREATININE 0.8 06/07/2017 1430   CALCIUM 9.0 10/29/2020 0752   CALCIUM 9.3 06/07/2017 1430   PROT 6.4 (L) 10/29/2020 0752   PROT 6.6 09/30/2016 1027   ALBUMIN 3.4 (L) 10/29/2020 0752   ALBUMIN 3.5 09/30/2016 1027   AST 13 (L) 10/29/2020 0752   AST 18 09/30/2016 1027   ALT 11 10/29/2020 0752   ALT 21 09/30/2016 1027    ALKPHOS 88 10/29/2020 0752   ALKPHOS 77 09/30/2016 1027   BILITOT 0.4 10/29/2020 0752   BILITOT 0.37 09/30/2016 1027   GFRNONAA >60 10/29/2020 0752   GFRAA >60 06/29/2020 0913   GFRAA >60 01/20/2020 1029    No results found for: SPEP, UPEP  Lab Results  Component Value Date   WBC 3.0 (L) 10/29/2020   NEUTROABS 1.7 10/29/2020   HGB 8.9 (L) 10/29/2020   HCT 26.6 (L) 10/29/2020   MCV 99.3 10/29/2020   PLT 206 10/29/2020      Chemistry      Component Value Date/Time   NA 136 10/29/2020 0752   NA 139 06/07/2017 1430   K 4.3 10/29/2020 0752   K 3.8 06/07/2017 1430   CL 105 10/29/2020 0752   CO2 25 10/29/2020 0752   CO2 27 06/07/2017 1430   BUN 20 10/29/2020 0752   BUN 14.0 06/07/2017 1430   CREATININE 0.95 10/29/2020 0752   CREATININE 0.8 06/07/2017 1430      Component Value Date/Time   CALCIUM 9.0 10/29/2020 0752   CALCIUM 9.3 06/07/2017 1430   ALKPHOS 88 10/29/2020 0752   ALKPHOS 77 09/30/2016 1027   AST 13 (L) 10/29/2020 0752   AST 18 09/30/2016 1027   ALT 11 10/29/2020 0752   ALT 21 09/30/2016 1027   BILITOT 0.4 10/29/2020 0752   BILITOT 0.37 09/30/2016 1027

## 2020-10-29 NOTE — Patient Instructions (Signed)
[facility] Discharge Instructions for Patients Receiving Chemotherapy  Today you received the following chemotherapy agents: doxorubicin liposomal/carboplatin.  To help prevent nausea and vomiting after your treatment, we encourage you to take your nausea medication as directed.   If you develop nausea and vomiting that is not controlled by your nausea medication, call the clinic.   BELOW ARE SYMPTOMS THAT SHOULD BE REPORTED IMMEDIATELY:  *FEVER GREATER THAN 100.5 F  *CHILLS WITH OR WITHOUT FEVER  NAUSEA AND VOMITING THAT IS NOT CONTROLLED WITH YOUR NAUSEA MEDICATION  *UNUSUAL SHORTNESS OF BREATH  *UNUSUAL BRUISING OR BLEEDING  TENDERNESS IN MOUTH AND THROAT WITH OR WITHOUT PRESENCE OF ULCERS  *URINARY PROBLEMS  *BOWEL PROBLEMS  UNUSUAL RASH Items with * indicate a potential emergency and should be followed up as soon as possible.  Feel free to call the clinic should you have any questions or concerns. The clinic phone number is [phone].  Please show the [facility] at check-in to the Emergency Department and triage nurse.

## 2020-10-29 NOTE — Telephone Encounter (Signed)
Scheduled appointments per 11/3 sch msg. Spoke to patient who is aware of appointment date and time.  

## 2020-10-30 LAB — CA 125: Cancer Antigen (CA) 125: 88 U/mL — ABNORMAL HIGH (ref 0.0–38.1)

## 2020-11-01 LAB — CBC WITH AUTO DIFFERENTIAL
Basophils %: 1 % (ref 0–1)
Basophils Absolute: 0.1 10*3/uL (ref 0.0–0.1)
Eosinophils %: 6 % (ref 0–7)
Eosinophils Absolute: 0.6 10*3/uL — ABNORMAL HIGH (ref 0.0–0.4)
Granulocyte Absolute Count: 0 10*3/uL (ref 0.00–0.04)
Hematocrit: 39.6 % (ref 35.0–47.0)
Hemoglobin: 12.9 g/dL (ref 11.5–16.0)
Immature Granulocytes: 0 % (ref 0.0–0.5)
Lymphocytes %: 24 % (ref 12–49)
Lymphocytes Absolute: 2.2 10*3/uL (ref 0.8–3.5)
MCH: 29.9 PG (ref 26.0–34.0)
MCHC: 32.6 g/dL (ref 30.0–36.5)
MCV: 91.9 FL (ref 80.0–99.0)
MPV: 10.8 FL (ref 8.9–12.9)
Monocytes %: 10 % (ref 5–13)
Monocytes Absolute: 0.9 10*3/uL (ref 0.0–1.0)
NRBC Absolute: 0 10*3/uL (ref 0.00–0.01)
Neutrophils %: 59 % (ref 32–75)
Neutrophils Absolute: 5.4 10*3/uL (ref 1.8–8.0)
Nucleated RBCs: 0 PER 100 WBC
Platelets: 339 10*3/uL (ref 150–400)
RBC: 4.31 M/uL (ref 3.80–5.20)
RDW: 13.3 % (ref 11.5–14.5)
WBC: 9.2 10*3/uL (ref 3.6–11.0)

## 2020-11-01 LAB — COMPREHENSIVE METABOLIC PANEL
ALT: 18 U/L (ref 12–78)
AST: 11 U/L — ABNORMAL LOW (ref 15–37)
Albumin/Globulin Ratio: 1.1 (ref 1.1–2.2)
Albumin: 3.7 g/dL (ref 3.5–5.0)
Alkaline Phosphatase: 63 U/L (ref 45–117)
Anion Gap: 8 mmol/L (ref 5–15)
BUN: 27 MG/DL — ABNORMAL HIGH (ref 6–20)
Bun/Cre Ratio: 22 — ABNORMAL HIGH (ref 12–20)
CO2: 26 mmol/L (ref 21–32)
Calcium: 8.9 MG/DL (ref 8.5–10.1)
Chloride: 105 mmol/L (ref 97–108)
Creatinine: 1.23 MG/DL — ABNORMAL HIGH (ref 0.55–1.02)
EGFR IF NonAfrican American: 42 mL/min/{1.73_m2} — ABNORMAL LOW (ref >60)
GFR African American: 51 mL/min/{1.73_m2} — ABNORMAL LOW (ref >60)
Globulin: 3.3 g/dL (ref 2.0–4.0)
Glucose: 121 mg/dL — ABNORMAL HIGH (ref 65–100)
Potassium: 3.6 mmol/L (ref 3.5–5.1)
Sodium: 139 mmol/L (ref 136–145)
Total Bilirubin: 0.4 MG/DL (ref 0.2–1.0)
Total Protein: 7 g/dL (ref 6.4–8.2)

## 2020-11-01 LAB — LIPID PANEL
CHOL/HDL Ratio: 3.5 (ref 0.0–5.0)
Chol/HDL Ratio: 3.5 (ref 0.0–5.0)
Cholesterol, Total: 145 MG/DL (ref <200)
Cholesterol, total: 145 MG/DL (ref <200)
HDL Cholesterol: 42 MG/DL
HDL: 42 MG/DL
LDL Calculated: 51.8 MG/DL (ref 0–100)
LDL, calculated: 51.8 MG/DL (ref 0–100)
Triglyceride: 256 MG/DL — ABNORMAL HIGH (ref <150)
Triglycerides: 256 MG/DL — ABNORMAL HIGH (ref <150)
VLDL Cholesterol Calculated: 51.2 MG/DL
VLDL, calculated: 51.2 MG/DL

## 2020-11-01 LAB — HEMOGLOBIN A1C W/EAG
Hemoglobin A1C: 6.2 % — ABNORMAL HIGH (ref 4.0–5.6)
eAG: 131 mg/dL

## 2020-11-01 LAB — METABOLIC PANEL, COMPREHENSIVE
A-G Ratio: 1.1 (ref 1.1–2.2)
ALT (SGPT): 18 U/L (ref 12–78)
AST (SGOT): 11 U/L — ABNORMAL LOW (ref 15–37)
Albumin: 3.7 g/dL (ref 3.5–5.0)
Alk. phosphatase: 63 U/L (ref 45–117)
Anion gap: 8 mmol/L (ref 5–15)
BUN/Creatinine ratio: 22 — ABNORMAL HIGH (ref 12–20)
BUN: 27 MG/DL — ABNORMAL HIGH (ref 6–20)
Bilirubin, total: 0.4 MG/DL (ref 0.2–1.0)
CO2: 26 mmol/L (ref 21–32)
Calcium: 8.9 MG/DL (ref 8.5–10.1)
Chloride: 105 mmol/L (ref 97–108)
Creatinine: 1.23 MG/DL — ABNORMAL HIGH (ref 0.55–1.02)
GFR est AA: 51 mL/min/{1.73_m2} — ABNORMAL LOW (ref >60)
GFR est non-AA: 42 mL/min/{1.73_m2} — ABNORMAL LOW (ref >60)
Globulin: 3.3 g/dL (ref 2.0–4.0)
Glucose: 121 mg/dL — ABNORMAL HIGH (ref 65–100)
Potassium: 3.6 mmol/L (ref 3.5–5.1)
Protein, total: 7 g/dL (ref 6.4–8.2)
Sodium: 139 mmol/L (ref 136–145)

## 2020-11-01 LAB — CBC WITH AUTOMATED DIFF
ABS. BASOPHILS: 0.1 10*3/uL (ref 0.0–0.1)
ABS. EOSINOPHILS: 0.6 10*3/uL — ABNORMAL HIGH (ref 0.0–0.4)
ABS. IMM. GRANS.: 0 10*3/uL (ref 0.00–0.04)
ABS. LYMPHOCYTES: 2.2 10*3/uL (ref 0.8–3.5)
ABS. MONOCYTES: 0.9 10*3/uL (ref 0.0–1.0)
ABS. NEUTROPHILS: 5.4 10*3/uL (ref 1.8–8.0)
ABSOLUTE NRBC: 0 10*3/uL (ref 0.00–0.01)
BASOPHILS: 1 % (ref 0–1)
EOSINOPHILS: 6 % (ref 0–7)
HCT: 39.6 % (ref 35.0–47.0)
HGB: 12.9 g/dL (ref 11.5–16.0)
IMMATURE GRANULOCYTES: 0 % (ref 0.0–0.5)
LYMPHOCYTES: 24 % (ref 12–49)
MCH: 29.9 PG (ref 26.0–34.0)
MCHC: 32.6 g/dL (ref 30.0–36.5)
MCV: 91.9 FL (ref 80.0–99.0)
MONOCYTES: 10 % (ref 5–13)
MPV: 10.8 FL (ref 8.9–12.9)
NEUTROPHILS: 59 % (ref 32–75)
NRBC: 0 PER 100 WBC
PLATELET: 339 10*3/uL (ref 150–400)
RBC: 4.31 M/uL (ref 3.80–5.20)
RDW: 13.3 % (ref 11.5–14.5)
WBC: 9.2 10*3/uL (ref 3.6–11.0)

## 2020-11-01 LAB — HEMOGLOBIN A1C WITH EAG
Est. average glucose: 131 mg/dL
Hemoglobin A1c: 6.2 % — ABNORMAL HIGH (ref 4.0–5.6)

## 2020-11-03 ENCOUNTER — Encounter

## 2020-11-03 NOTE — Progress Notes (Signed)
Your kidney function is back to normal now

## 2020-11-03 NOTE — Addendum Note (Signed)
Addendum Note  by Sonny Dandy at 11/03/20 8916                Author: Sonny Dandy  Service: --  Author Type: Technician       Filed: 12/03/20 1359  Encounter Date: 11/03/2020  Status: Signed          Editor: Sonny Dandy (Technician)          Addended by: Sonny Dandy on: 12/03/2020 01:59 PM    Modules accepted: Orders

## 2020-11-12 ENCOUNTER — Telehealth: Payer: Self-pay | Admitting: Oncology

## 2020-11-12 ENCOUNTER — Encounter: Payer: Self-pay | Admitting: Hematology and Oncology

## 2020-11-12 ENCOUNTER — Inpatient Hospital Stay (HOSPITAL_BASED_OUTPATIENT_CLINIC_OR_DEPARTMENT_OTHER): Payer: Medicare Other | Admitting: Hematology and Oncology

## 2020-11-12 ENCOUNTER — Telehealth: Payer: Self-pay

## 2020-11-12 ENCOUNTER — Other Ambulatory Visit: Payer: Self-pay

## 2020-11-12 ENCOUNTER — Ambulatory Visit (HOSPITAL_COMMUNITY)
Admission: RE | Admit: 2020-11-12 | Discharge: 2020-11-12 | Disposition: A | Discharge status: Home / Self Care | Payer: Medicare Other | Source: Ambulatory Visit | Attending: Hematology and Oncology | Admitting: Hematology and Oncology

## 2020-11-12 DIAGNOSIS — C569 Malignant neoplasm of unspecified ovary: Secondary | ICD-10-CM | POA: Diagnosis not present

## 2020-11-12 DIAGNOSIS — C786 Secondary malignant neoplasm of retroperitoneum and peritoneum: Secondary | ICD-10-CM | POA: Diagnosis not present

## 2020-11-12 DIAGNOSIS — C5700 Malignant neoplasm of unspecified fallopian tube: Secondary | ICD-10-CM

## 2020-11-12 DIAGNOSIS — C5702 Malignant neoplasm of left fallopian tube: Secondary | ICD-10-CM | POA: Diagnosis not present

## 2020-11-12 DIAGNOSIS — C762 Malignant neoplasm of abdomen: Secondary | ICD-10-CM

## 2020-11-12 DIAGNOSIS — Z7189 Other specified counseling: Secondary | ICD-10-CM

## 2020-11-12 DIAGNOSIS — C787 Secondary malignant neoplasm of liver and intrahepatic bile duct: Secondary | ICD-10-CM | POA: Diagnosis not present

## 2020-11-12 DIAGNOSIS — Z5111 Encounter for antineoplastic chemotherapy: Secondary | ICD-10-CM | POA: Diagnosis not present

## 2020-11-12 MED ORDER — IOHEXOL 300 MG/ML  SOLN
100.0000 mL | Freq: Once | INTRAMUSCULAR | Status: AC | PRN
  Administered 2020-11-12: 100 mL via INTRAVENOUS

## 2020-11-12 NOTE — Telephone Encounter (Signed)
Called back and moved appt to today at 1:30 pm. She is aware of appt time.

## 2020-11-12 NOTE — Assessment & Plan Note (Signed)
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

## 2020-11-12 NOTE — Telephone Encounter (Signed)
Called and offered appt at 1:30 today. Ask her to call the office back.

## 2020-11-12 NOTE — Progress Notes (Signed)
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 Potentially, we could consider treatment with single agent docetaxel or topotecan The risk, benefits, side effects of each option were discussed She is undecided She would like to go home and think about it She will call me in a few weeks for final decision If the patient decided against further palliative chemotherapy, I recommend we pursue palliative care with hospice  Goals of care, counseling/discussion She is not symptomatic after recent chemotherapy We discussed prognosis briefly The patient is pondering the possibility of traveling to visit friends and family She is aware of poor prognosis and any future treatment response rates are low and consider palliative in nature   No orders of the defined types were placed in this encounter.   All questions were answered. The patient knows to call the clinic with any problems, questions or concerns. The total time spent in the appointment was 40 minutes encounter with patients including review of chart and various tests results, discussions about plan of care and coordination of care plan   [name], MD 11/12/2020 3:23 PM  INTERVAL HISTORY: Please see below for problem oriented charting. 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 Extensive omental nodularity highly worrisome for peritoneal carcinomatosis. Late recurrence of melanoma can present as peritoneal carcinomatosis. Ovarian cancer more commonly presents in this manner. Patient does have a predominately low density right adnexal lesion measuring up to 3.5 cm, although this lesion is not typical for ovarian cancer. 2. No evidence of bowel or ureteral obstruction. 3. No other definite signs of metastatic disease. There are small indeterminate low-density hepatic lesions.   02/08/2016 Tumor Marker   Patient's tumor was tested for the following markers: CA-125 Results of the tumor marker test revealed 656.2   02/15/2016 Procedure   CT-guided core biopsy performed of omental mass just deep to the abdominal wall.   02/15/2016 Pathology Results   Omentum, biopsy, left - PAPILLARY SEROUS NEOPLASM, SEE COMMENT. Microscopic Comment There are papillary collections of largely low grade appearing cells with numerous psammoma bodies. Given the limited material it is difficult to distinguish between invasive implants of a serous borderline tumor and serous carcinoma, especially given the low grade appearance.   02/18/2016 Pathology Results   1. Omentum, resection for tumor INVASIVE IMPLANT OF HIGH GRADE SEROUS CARCINOMA 2. Uterus +/- tubes/ovaries, neoplastic HIGH GRADE SEROUS CARCINOMA INVOLVING LEFT TUBAL FIMBRIA SEROUS CARCINOMA WITH PREDOMINANT PSAMMOMA BODIES IMPLANT AT UTERUS  SEROSA, BILATERAL FALLOPIAN TUBAL SEROSA, AND ANTERIOR  PERITONEAL REFLECTION CERVIX: HISTOLOGICAL UNREMARKABLE ENDOMETRIUM: INACTIVE ENDOMETRIUM MYOMETRIUM: LEIOMYOMA LEFT OVARY: CYSTADENOFIBROMA RIGHT OVARY AND FALLOPIAN TUBE: HISTOLOGICAL UNREMARKABLE Microscopic Comment 2. ONCOLOGY TABLE - FALLOPIAN TUBE 1. Specimen, including laterality: Omentum, uterus, bilateral ovaries and fallopian tubes 2. Procedure: Hysterectomy, bilateral salpingo-oophorectomy and tumor debulking omentectomy 3. Lymph node sampling performed: No 4. Tumor site: uterus serosa, bilateral fallopian tubal serosa, peritoneal and omentum 5. Tumor location in fallopian tube: Left fallopian tubal fimbria 6. Specimen integrity (intact/ruptured/disrupted): Intact 7. Tumor size (cm): multi focal invasive omentum implant greater than 2 cm, left fallopian tube tumor 0.8 cm. 8. Histologic type: Serous carcinoma 9. Grade: 3 10. Microscopic tumor extension: uterus serosa, bilateral fallopian tubal serosa, peritoneal and omentum 11. Margins: NA 12. Lymph-Vascular invasion: identified 13. Lymph nodes: # examined: 0; # positive: NA 14. TNM: pT3c, pNx 15. FIGO Stage (based on pathologic findings, needs clinical correlation: IIIC 16. Comment: High grade serous carcinoma multifocally and extensively involves left fallopian tubal fimbria, omentum, uterus serosa, bilateral fallopian tubal serosa and peritoneal. At the left fallopian tube there are small foci of serous tubal intraepithelial carcinoma identified, so we conclude the carcinoma is fallopian tube origin   03/09/2016 Tumor Marker   Patient's tumor was tested for the following markers: CA-125 Results of the tumor marker test revealed 154.4   03/15/2016 Procedure   Technically successful right IJ power-injectable port catheter placement. Ready for routine use.   03/18/2016 - 07/13/2016 Chemotherapy   The patient had 6 cycles of carboplatin and taxol   04/14/2016 Tumor Marker   Patient's tumor was tested for the following markers:  CA-125 Results of the tumor marker test revealed 36.7   04/25/2016 Genetic Testing   Patient has genetic testing done for germline mutation Results revealed patient has no mutation   04/28/2016 Tumor Marker   Patient's tumor was tested for the following markers: CA-125 Results of the tumor marker test revealed 24.6   06/21/2016 Tumor Marker   Patient's tumor was tested for the following markers: CA-125 Results of the tumor marker test revealed 16.6   08/01/2016 Tumor Marker   Patient's tumor was tested for the following markers: CA-125 Results of the tumor marker test revealed 17.2   08/05/2016 Imaging   Interval TAH-BSO. No evidence of residual pelvic mass or metastatic disease within the abdomen or pelvis. No other acute findings.    11/04/2016 Tumor Marker   Patient's tumor was tested for the following markers: CA-125 Results of the tumor marker test revealed 13.6   01/20/2017 Tumor Marker   Patient's tumor was tested for the following markers: CA-125 Results of the tumor marker test revealed 13.8   04/14/2017 Tumor Marker   Patient's tumor was tested for the following markers: CA-125 Results of the tumor marker test revealed 16.1   06/13/2017 Imaging   No acute findings.  No mass or hernia identified.  Colonic diverticulosis, without radiographic evidence of diverticulitis.  Aortic atherosclerosis.   07/03/2017 Tumor Marker   Patient's tumor was tested for the following markers: CA-125 Results of the tumor marker test revealed 18.4   09/19/2017 Tumor Marker   Patient's tumor was tested for the following markers: CA-125 Results of the tumor marker test revealed 18.5   01/23/2018 Tumor Marker   Patient's tumor was tested for the following markers: CA-125 Results of the tumor marker test revealed 35.4   01/30/2018 Imaging   1. No evidence of local fallopian tube carcinoma recurrence within the pelvis. Post  hysterectomy. 2. No evidence of metastatic peritoneal disease or  omental disease. No solid organ metastasis.    03/20/2018 Tumor Marker   Patient's tumor was tested for the following markers: CA-125 Results of the tumor marker test revealed 73   05/02/2018 Tumor Marker   Patient's tumor was tested for the following markers: CA-125 Results of the tumor marker test revealed 127.1   05/08/2018 Imaging   CT abdomen and pelvis 1. 7 cm left subdiaphragmatic collection of loculated fluid versus low-density soft tissue. Given the patient's history of rising CA 125, metastatic disease considered highly likely.  2. Areas of fluid or recurrent disease identified in the right lower quadrant adjacent to the cecum and along right lower quadrant small bowel loops.   05/14/2018 Tumor Marker   Patient's tumor was tested for the following markers: CA-125 Results of the tumor marker test revealed 166    Genetic Testing   Patient has genetic testing done for ER/PR and MMR. Results revealed patient has the following on 02/18/2016 surgical pathology: ER 20%, PR 0% MMR: normal    Genetic Testing   Patient has genetic testing done for MSI. Results revealed patient has the following: MSI stable   05/18/2018 - 12/31/2018 Chemotherapy   The patient had carboplatin and Doxil   07/16/2018 Tumor Marker   Patient's tumor was tested for the following markers: CA-125 Results of the tumor marker test revealed 28.7   08/10/2018 Imaging   CT imaging:  Decreased size of peritoneal soft tissue masses in left upper quadrant, consistent with decreased metastatic disease.  No new or progressive metastatic disease. No other acute findings.  Colonic diverticulosis, without radiographic evidence of diverticulitis.  Stable small paraumbilical ventral hernia containing transverse colon.   08/13/2018 Tumor Marker   Patient's tumor was tested for the following markers: CA-125 Results of the tumor marker test revealed 21.6   08/24/2018 Echocardiogram   LV EF: 60% -  65%    10/15/2018 Tumor Marker   Patient's tumor was tested for the following markers: CA-125 Results of the tumor marker test revealed 20.1   10/19/2018 Imaging   Ct scan of abdomen and pelvis Status post hysterectomy, bilateral salpingo-oophorectomy, and omentectomy.  Two peritoneal implants in the left upper abdomen are stable versus mildly decreased, as above. No abdominopelvic ascites.  No evidence of new/progressive metastatic disease.     11/19/2018 Tumor Marker   Patient's tumor was tested for the following markers: CA-125 Results of the tumor marker test revealed 21.7   11/23/2018 Echocardiogram   1. The left ventricle has normal systolic function of 16-10%. The cavity size was normal. There is no increased left ventricular wall thickness. Echo evidence of impaired diastolic relaxation.  2. The right ventricle has normal systolic function. The cavity was normal. There is no increase in right ventricular wall thickness.  3. The mitral valve is normal in structure. There is mild mitral annular calcification present.  4. The tricuspid valve is normal in structure.  5. The aortic valve is tricuspid There is mild thickening of the aortic valve.  6. The pulmonic valve was normal in structure. Pulmonic valve regurgitation is mild by color flow Doppler.  7. Normal LV systolic function; mild diastolic dysfunction.   12/31/2018 Tumor Marker   Patient's tumor was tested for the following markers: CA-125 Results of the tumor marker test revealed 17.1   01/31/2019 Imaging   1. Left upper quadrant peritoneal implants described previously have decreased in the interval. No new or progressive findings  in the abdomen/pelvis today. No free fluid. 2. Right paraumbilical ventral hernia contains a small knuckle of transverse colon without complicating features. 3.  Aortic Atherosclerois (ICD10-170.0)  Aortic Atherosclerosis (ICD10-I70.0).   01/31/2019 Tumor Marker   Patient's tumor was tested for  the following markers: CA-125 Results of the tumor marker test revealed 18.9   02/11/2019 - 03/13/2019 Chemotherapy   The patient is taking Niraparib. She self discontinued after 1 month   02/25/2019 Tumor Marker   Patient's tumor was tested for the following markers: CA-125 Results of the tumor marker test revealed 18.6   05/13/2019 Tumor Marker   Patient's tumor was tested for the following markers: CA-125 Results of the tumor marker test revealed 230   05/22/2019 Imaging   1. Increased size of 4.6 cm soft tissue mass in the gastrosplenic ligament, consistent with metastatic disease. 2. No other sites of metastatic disease identified within the abdomen or pelvis. 3. Colonic diverticulosis. No radiographic evidence of diverticulitis. 4. Stable small right paraumbilical hernia containing transverse colon.   Aortic Atherosclerosis (ICD10-I70.0).   05/31/2019 - 08/22/2019 Chemotherapy   The patient had bevacizumab and Taxol for chemotherapy treatment.     05/31/2019 Tumor Marker   Patient's tumor was tested for the following markers: CA-125 Results of the tumor marker test revealed 84   06/21/2019 Tumor Marker   Patient's tumor was tested for the following markers: CA-125 Results of the tumor marker test revealed 49.4.   07/19/2019 Tumor Marker   Patient's tumor was tested for the following markers: CA-125. Results of the tumor marker test revealed 44.5   08/22/2019 Imaging   1. Increase in size of 5.0 cm mass in the gastro splenic ligament consistent with metastatic disease. 2. No additional sites of disease identified within the abdomen or pelvis. 3. Unchanged paraumbilical hernia containing nonobstructed loop of large bowel. 4.  Aortic Atherosclerosis (ICD10-I70.0).     08/30/2019 Tumor Marker   Patient's tumor was tested for the following markers: CA-125 Results of the tumor marker test revealed 71.5   08/30/2019 - 05/11/2020 Chemotherapy   The patient had gemzar for  chemotherapy treatment.     09/27/2019 Tumor Marker   Patient's tumor was tested for the following markers: CA-125. Results of the tumor marker test revealed 38.5   10/14/2019 Tumor Marker   Patient's tumor was tested for the following markers: CA-125 Results of the tumor marker test revealed 40.8   10/21/2019 Tumor Marker   Patient's tumor was tested for the following markers: CA-125 Results of the tumor marker test revealed 34.8.   11/22/2019 Imaging   1. Interval decrease in size of left upper quadrant cystic and solid peritoneal lesion. 2. No new sites of disease. 3. Unchanged periumbilical hernia containing a nonobstructed loop of small bowel. 4.  Aortic Atherosclerosis (ICD10-I70.0).     11/25/2019 Tumor Marker   Patient's tumor was tested for the following markers: CA-125 Results of the tumor marker test revealed 22.7   12/23/2019 Tumor Marker   Patient's tumor was tested for the following markers: CA-125 Results of the tumor marker test revealed 26.3   01/20/2020 Tumor Marker   Patient's tumor was tested for the following markers: CA-125 Results of the tumor marker test revealed 28.3   02/14/2020 Imaging   1. 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 2. No new or progressive disease identified within the abdomen or pelvis. 3. Colonic diverticulosis. No radiographic evidence of diverticulitis. 4. Small paraumbilical ventral hernia.  02/17/2020 Tumor Marker   Patient's tumor was tested for the following markers: CA-125 Results of the tumor marker test revealed 40.1   05/11/2020 Tumor Marker   Patient's tumor was tested for the following markers: CA-125 Results of the tumor marker test revealed 113.   05/22/2020 Imaging   1. 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 2. No evidence of ascites, bowel or ureteral  obstruction. 3. 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 No evidence of incarceration or obstruction. 4. Aortic Atherosclerosis (ICD10-I70.0).   05/22/2020 Tumor Marker   Patient's tumor was tested for the following markers: CA-125 Results of the tumor marker test revealed 118   05/29/2020 Echocardiogram   1. Normal GLS -19.8. Left ventricular ejection fraction, by estimation, is 55 to 60%. The left ventricle has normal function. The left ventricle has no regional wall motion abnormalities. Left ventricular diastolic parameters were normal.  2. Right ventricular systolic function is normal. The right ventricular size is normal.  3. The mitral valve is degenerative. Trivial mitral valve regurgitation. No evidence of mitral stenosis.  4. The aortic valve is tricuspid. Aortic valve regurgitation is not visualized. Mild to moderate aortic valve sclerosis/calcification is present, without any evidence of aortic stenosis.  5. The inferior vena cava is normal in size with greater than 50% respiratory variability, suggesting right atrial pressure of 3 mmHg.   06/01/2020 - 10/29/2020 Chemotherapy   The patient had carboplatin and doxil for chemotherapy treatment for 6 cycles    06/01/2020 Tumor Marker   Patient's tumor was tested for the following markers: CA-125 Results of the tumor marker test revealed 126.   06/29/2020 Tumor Marker   Patient's tumor was tested for the following markers: CA-125. Results of the tumor marker test revealed 108   07/27/2020 Tumor Marker   Patient's tumor was tested for the following markers: CA-125. Results of the tumor marker test revealed 89.1   08/18/2020 Imaging   1. Interval positive response to therapy. 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 No new or progressive metastatic disease in the abdomen or pelvis. 2. Chronic findings include: Small hiatal hernia. Mild  to moderate colonic diverticulosis. Aortic Atherosclerosis (ICD10-I70.0).   08/24/2020 Tumor Marker   Patient's tumor was tested for the following markers: CA-125 Results of the tumor marker test revealed 81.2   08/25/2020 Echocardiogram   1. Left ventricular ejection fraction, by estimation, is 60 to 65%. The left ventricle has normal function. The left ventricle has no regional wall motion abnormalities. Left ventricular diastolic parameters were normal. The average left ventricular global longitudinal strain is -21.4 %. The global longitudinal strain is normal.  2. Right ventricular systolic function is normal. The right ventricular size is normal.  3. The mitral valve is normal in structure. No evidence of mitral valve regurgitation. No evidence of mitral stenosis.  4. The aortic valve is tricuspid. There is moderate calcification of the aortic valve. There is moderate thickening of the aortic valve. Aortic valve regurgitation is not visualized. Mild to moderate aortic valve sclerosis/calcification is present, without any evidence of aortic stenosis.  5. Aortic dilatation noted. There is mild dilatation of the aortic root, measuring 37 mm.  6. The inferior vena cava is normal in size with greater than 50% respiratory variability, suggesting right atrial pressure of 3 mmHg.   09/21/2020 Tumor Marker   Patient's  tumor was tested for the following markers: CA-125 Results of the tumor marker test revealed 72.7.   10/01/2020 Tumor Marker   Patient's tumor was tested for the following markers: CA-125 Results of the tumor marker test revealed 80.9   10/29/2020 Tumor Marker   Patient's tumor was tested for the following markers: CA-125 Results of the tumor marker test revealed 88   11/12/2020 Imaging   1. 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 2. Small right paramidline paraumbilical ventral hernia containing a short segment of mid transverse colon, without  evidence of bowel incarceration or obstruction at this time. 3. Colonic diverticulosis without evidence of acute diverticulitis at this time. 4. Aortic atherosclerosis, in addition to left main coronary artery disease. Please note that although the presence of coronary artery calcium documents the presence of coronary artery disease, the severity of this disease and any potential stenosis cannot be assessed on this non-gated CT examination. Assessment for potential risk factor modification, dietary therapy or pharmacologic therapy may be warranted, if clinically indicated. 5. Additional incidental findings, as above.     REVIEW OF SYSTEMS:   Constitutional: Denies fevers, chills or abnormal weight loss Eyes: Denies blurriness of vision Ears, nose, mouth, throat, and face: Denies mucositis or sore throat Respiratory: Denies cough, dyspnea or wheezes Cardiovascular: Denies palpitation, chest discomfort or lower extremity swelling Gastrointestinal:  Denies nausea, heartburn or change in bowel habits Skin: Denies abnormal skin rashes Lymphatics: Denies new lymphadenopathy or easy bruising Neurological:Denies numbness, tingling or new weaknesses Behavioral/Psych: Mood is stable, no new changes  All other systems were reviewed with the patient and are negative.  I have reviewed the past medical history, past surgical history, social history and family history with the patient and they are unchanged from previous note.  ALLERGIES:  has No Known Allergies.  MEDICATIONS:  Current Outpatient Medications  Medication Sig Dispense Refill  . Cholecalciferol (VITAMIN D) 2000 units CAPS Take 1 capsule by mouth daily.    Marland Kitchen ezetimibe (ZETIA) 10 MG tablet Take 10 mg by mouth daily.    Marland Kitchen lidocaine-prilocaine (EMLA) cream Apply to affected area once 30 g 3  . ondansetron (ZOFRAN) 8 MG tablet Take 1 tablet (8 mg total) by mouth 2 (two) times daily as needed (Nausea or vomiting). 30 tablet 1  .  prochlorperazine (COMPAZINE) 10 MG tablet Take 1 tablet (10 mg total) by mouth every 6 (six) hours as needed (Nausea or vomiting). 60 tablet 1   No current facility-administered medications for this visit.    PHYSICAL EXAMINATION: ECOG PERFORMANCE STATUS: 1 - Symptomatic but completely ambulatory  Vitals:   11/12/20 1315  BP: (!) 157/85  Pulse: 92  Resp: 18  Temp: (!) 97 F (36.1 C)  SpO2: 100%   Filed Weights   11/12/20 1315  Weight: 151 lb 12.8 oz (68.9 kg)    GENERAL:alert, no distress and comfortable Musculoskeletal:no cyanosis of digits and no clubbing  NEURO: alert & oriented x 3 with fluent speech, no focal motor/sensory deficits  LABORATORY DATA:  I have reviewed the data as listed    Component Value Date/Time   NA 136 10/29/2020 0752   NA 139 06/07/2017 1430   K 4.3 10/29/2020 0752   K 3.8 06/07/2017 1430   CL 105 10/29/2020 0752   CO2 25 10/29/2020 0752   CO2 27 06/07/2017 1430   GLUCOSE 127 (H) 10/29/2020 0752   GLUCOSE 124 06/07/2017 1430   BUN 20 10/29/2020 0752   BUN  14.0 06/07/2017 1430   CREATININE 0.95 10/29/2020 0752   CREATININE 0.8 06/07/2017 1430   CALCIUM 9.0 10/29/2020 0752   CALCIUM 9.3 06/07/2017 1430   PROT 6.4 (L) 10/29/2020 0752   PROT 6.6 09/30/2016 1027   ALBUMIN 3.4 (L) 10/29/2020 0752   ALBUMIN 3.5 09/30/2016 1027   AST 13 (L) 10/29/2020 0752   AST 18 09/30/2016 1027   ALT 11 10/29/2020 0752   ALT 21 09/30/2016 1027   ALKPHOS 88 10/29/2020 0752   ALKPHOS 77 09/30/2016 1027   BILITOT 0.4 10/29/2020 0752   BILITOT 0.37 09/30/2016 1027   GFRNONAA >60 10/29/2020 0752   GFRAA >60 06/29/2020 0913   GFRAA >60 01/20/2020 1029    No results found for: SPEP, UPEP  Lab Results  Component Value Date   WBC 3.0 (L) 10/29/2020   NEUTROABS 1.7 10/29/2020   HGB 8.9 (L) 10/29/2020   HCT 26.6 (L) 10/29/2020   MCV 99.3 10/29/2020   PLT 206 10/29/2020      Chemistry      Component Value Date/Time   NA 136 10/29/2020 0752   NA  139 06/07/2017 1430   K 4.3 10/29/2020 0752   K 3.8 06/07/2017 1430   CL 105 10/29/2020 0752   CO2 25 10/29/2020 0752   CO2 27 06/07/2017 1430   BUN 20 10/29/2020 0752   BUN 14.0 06/07/2017 1430   CREATININE 0.95 10/29/2020 0752   CREATININE 0.8 06/07/2017 1430      Component Value Date/Time   CALCIUM 9.0 10/29/2020 0752   CALCIUM 9.3 06/07/2017 1430   ALKPHOS 88 10/29/2020 0752   ALKPHOS 77 09/30/2016 1027   AST 13 (L) 10/29/2020 0752   AST 18 09/30/2016 1027   ALT 11 10/29/2020 0752   ALT 21 09/30/2016 1027   BILITOT 0.4 10/29/2020 0752   BILITOT 0.37 09/30/2016 1027       RADIOGRAPHIC STUDIES: I have reviewed multiple imaging studies with the patient I have personally reviewed the radiological images as listed and agreed with the findings in the report. CT CHEST ABDOMEN PELVIS W CONTRAST  Result Date: 11/12/2020 CLINICAL DATA:  80 year old female with history of cancer of the left fallopian tube. Recurrence in 2019. Chemotherapy in progress. Follow-up study. EXAM: CT CHEST, ABDOMEN, AND PELVIS WITH CONTRAST TECHNIQUE: Multidetector CT imaging of the chest, abdomen and pelvis was performed following the standard protocol during bolus administration of intravenous contrast. CONTRAST:  163mL OMNIPAQUE IOHEXOL 300 MG/ML  SOLN COMPARISON:  CT of the abdomen and pelvis 08/18/2020. No prior chest CT. FINDINGS: CT CHEST FINDINGS Cardiovascular: Heart size is normal. There is no significant pericardial fluid, thickening or pericardial calcification. There is aortic atherosclerosis, as well as atherosclerosis of the great vessels of the mediastinum and the coronary arteries, including calcified atherosclerotic plaque in the left main coronary artery. Right internal jugular single-lumen porta cath with tip terminating in the distal superior vena cava. Mediastinum/Nodes: No pathologically enlarged mediastinal or hilar lymph nodes. Esophagus is unremarkable in appearance. No axillary  lymphadenopathy. Lungs/Pleura: No suspicious appearing pulmonary nodules or masses are noted. No acute consolidative airspace disease. No pleural effusions. Mild linear scarring in the lower lobes of the lungs bilaterally, similar to the prior study. Musculoskeletal: There are no aggressive appearing lytic or blastic lesions noted in the visualized portions of the skeleton. CT ABDOMEN PELVIS FINDINGS Hepatobiliary: Previously noted hypovascular hepatic lesions appear increased in size. Specifically, lesion in segment 2 of the liver (axial image 51 of series 2) currently measures  1.9 x 1.7 cm (previously 1.7 x 1.1 cm). Lesion in segment 4 a of the liver (axial image 51 of series 2) currently measures 3.1 x 2.2 cm (previously 2.1 x 1.8 cm). Capsular implant overlying segment 5 of the liver (axial image 62 of series 2) has increased to 2.4 x 1.0 cm (previously 1.6 x 0.4 cm). No new hepatic lesions are otherwise noted. No intra or extrahepatic biliary ductal dilatation. Gallbladder is normal in appearance. Pancreas: No pancreatic mass. No pancreatic ductal dilatation. No pancreatic or peripancreatic fluid collections or inflammatory changes. Spleen: Spleen is in contact with the metastatic implant in the left upper quadrant, but is otherwise unremarkable in appearance. Adrenals/Urinary Tract: Subcentimeter low-attenuation lesion in the upper pole of the right kidney, too small to characterize, but statistically likely to represent a tiny cyst. Left kidney and bilateral adrenal glands are unremarkable in appearance. No hydroureteronephrosis. Urinary bladder is nearly decompressed, but otherwise unremarkable in appearance. Stomach/Bowel: Normal appearance of the stomach. No pathologic dilatation of small bowel or colon. Short segment of transverse colon extends into a right paramidline ventral hernia. Numerous colonic diverticulae are noted, without surrounding inflammatory changes to suggest an acute diverticulitis at  this time. Normal appendix. Vascular/Lymphatic: Aortic atherosclerosis, without evidence of aneurysm or dissection in the abdominal or pelvic vasculature. No lymphadenopathy noted in the abdomen or pelvis. Reproductive: Status post total abdominal hysterectomy and bilateral salpingo-oophorectomy. Other: Large metastatic deposit in the left upper quadrant abutting the greater curvature of the stomach and anterior aspect of the spleen, increased in size compared to the prior study, currently measuring 5.3 x 3.5 x 4.0 cm (axial image 53 of series 2 and coronal image 89 of series 5), now making broad contact with the proximal greater curvature of the stomach. Smaller adjacent implants are also noted in the region of the gastrosplenic ligament (coronal image 93 of series 5), also increased slightly compared to the prior study. Small metastatic deposit in the low left pericolic gutter (axial image 101 of series 2) measuring 1.1 x 0.4 cm, minimally increased compared to the prior study. Right paramidline ventral hernia adjacent to the umbilicus containing a short segment of the mid transverse colon. Musculoskeletal: There are no aggressive appearing lytic or blastic lesions noted in the visualized portions of the skeleton. IMPRESSION: 1. 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 2. Small right paramidline paraumbilical ventral hernia containing a short segment of mid transverse colon, without evidence of bowel incarceration or obstruction at this time. 3. Colonic diverticulosis without evidence of acute diverticulitis at this time. 4. Aortic atherosclerosis, in addition to left main coronary artery disease. Please note that although the presence of coronary artery calcium documents the presence of coronary artery disease, the severity of this disease and any potential stenosis cannot be assessed on this non-gated CT examination. Assessment for potential risk factor modification, dietary  therapy or pharmacologic therapy may be warranted, if clinically indicated. 5. Additional incidental findings, as above. Electronically Signed   By: Vinnie Langton M.D.   On: 11/12/2020 10:49

## 2020-11-12 NOTE — Assessment & Plan Note (Signed)
She is not symptomatic after recent chemotherapy We discussed prognosis briefly The patient is pondering the possibility of traveling to visit friends and family She is aware of poor prognosis and any future treatment response rates are low and consider palliative in nature

## 2020-11-12 NOTE — Telephone Encounter (Signed)
Jessica Johnston called and said she is leaning towards Topotecan for treatment and is wondering when it would start.  She would like to travel to [place] to see family.  Called her back and left a message saying that the soonest chemo would start would be 11/26/20.  Advised her to call back if she has any questions.

## 2020-11-13 ENCOUNTER — Inpatient Hospital Stay: Payer: Medicare Other | Admitting: Hematology and Oncology

## 2020-11-16 ENCOUNTER — Telehealth: Payer: Self-pay | Admitting: Oncology

## 2020-11-16 ENCOUNTER — Other Ambulatory Visit: Payer: Self-pay | Admitting: Hematology and Oncology

## 2020-11-16 DIAGNOSIS — C5702 Malignant neoplasm of left fallopian tube: Secondary | ICD-10-CM

## 2020-11-16 DIAGNOSIS — C762 Malignant neoplasm of abdomen: Secondary | ICD-10-CM

## 2020-11-16 DIAGNOSIS — C569 Malignant neoplasm of unspecified ovary: Secondary | ICD-10-CM

## 2020-11-16 DIAGNOSIS — C5700 Malignant neoplasm of unspecified fallopian tube: Secondary | ICD-10-CM

## 2020-11-16 DIAGNOSIS — C786 Secondary malignant neoplasm of retroperitoneum and peritoneum: Secondary | ICD-10-CM

## 2020-11-16 NOTE — Progress Notes (Signed)
DISCONTINUE OFF PATHWAY REGIMEN - Ovarian   [id number]:Liposomal Doxorubicin (Doxil) 30 mg/m2 + Carboplatin AUC=5 q28 Days:   A cycle is every 28 days:     Carboplatin      Liposomal doxorubicin   **Always confirm dose/schedule in your pharmacy ordering system**  REASON: Disease Progression PRIOR TREATMENT: Off Pathway: Liposomal Doxorubicin (Doxil) 30 mg/m2 + Carboplatin AUC=5 q28 Days TREATMENT RESPONSE: Progressive Disease (PD)  START ON PATHWAY REGIMEN - Ovarian     A cycle is every 28 days:     Topotecan   **Always confirm dose/schedule in your pharmacy ordering system**  Patient Characteristics: Recurrent or Progressive Disease, Fourth Line and Beyond, BRCA Mutation Absent BRCA Mutation Status: Absent Therapeutic Status: Recurrent or Progressive Disease Line of Therapy: Fourth Line and Beyond  Intent of Therapy: Non-Curative / Palliative Intent, Discussed with Patient

## 2020-11-16 NOTE — Telephone Encounter (Signed)
Jessica Johnston called and said she has decided on getting topotecan for her next treatment.  Advised her that Dr. Alvy Bimler will be notified and that a scheduler will call her with the appointment.

## 2020-11-16 NOTE — Telephone Encounter (Signed)
I sent LOS, earliest she can start is 2/10, in case if she asked

## 2020-11-17 ENCOUNTER — Telehealth: Payer: Self-pay | Admitting: Hematology and Oncology

## 2020-11-17 NOTE — Telephone Encounter (Signed)
Scheduled appt per 1/31 sch msg - pt is aware of appt date and time   

## 2020-11-19 NOTE — Progress Notes (Signed)
Pharmacist Chemotherapy Monitoring - Initial Assessment    Anticipated start date: 11/26/20  Regimen:  . Are orders appropriate based on the patient's diagnosis, regimen, and cycle? Yes . Does the plan date match the patient's scheduled date? Yes . Is the sequencing of drugs appropriate? Yes . Are the premedications appropriate for the patient's regimen? Yes . Prior Authorization for treatment is: Approved o If applicable, is the correct biosimilar selected based on the patient's insurance? not applicable  Organ Function and Labs: Marland Kitchen Are dose adjustments needed based on the patient's renal function, hepatic function, or hematologic function? No . Are appropriate labs ordered prior to the start of patient's treatment? Yes . Other organ system assessment, if indicated: N/A . The following baseline labs, if indicated, have been ordered: N/A  Dose Assessment: . Are the drug doses appropriate? Yes . Are the following correct: o Drug concentrations Yes o IV fluid compatible with drug Yes o Administration routes Yes o Timing of therapy Yes . If applicable, does the patient have documented access for treatment and/or plans for port-a-cath placement? yes . If applicable, have lifetime cumulative doses been properly documented and assessed? yes Lifetime Dose Tracking  . Carboplatin: 7,410 mg = 0.01 % of the maximum lifetime dose of 999,999,999 mg  . Liposomal Doxorubicin: 389.758 mg/m2 (684 mg) = 86.61 % of the maximum lifetime dose of 450 mg/m2  o   Toxicity Monitoring/Prevention: . The patient has the following take home antiemetics prescribed: Ondansetron and Prochlorperazine . The patient has the following take home medications prescribed: N/A . Medication allergies and previous infusion related reactions, if applicable, have been reviewed and addressed. Yes . The patient's current medication list has been assessed for drug-drug interactions with their chemotherapy regimen. no significant  drug-drug interactions were identified on review.  Order Review: . Are the treatment plan orders signed? Yes . Is the patient scheduled to see a provider prior to their treatment? Yes  I verify that I have reviewed each item in the above checklist and answered each question accordingly.   Kennith Center, Pharm.D., CPP 11/19/2020@4 :10 PM

## 2020-11-24 NOTE — Telephone Encounter (Signed)
Patient would like to schedule the one lab. Patient's phone: [phone]

## 2020-11-26 ENCOUNTER — Inpatient Hospital Stay: Payer: Medicare Other

## 2020-11-26 ENCOUNTER — Inpatient Hospital Stay (HOSPITAL_BASED_OUTPATIENT_CLINIC_OR_DEPARTMENT_OTHER): Payer: Medicare Other | Admitting: Hematology and Oncology

## 2020-11-26 ENCOUNTER — Other Ambulatory Visit: Payer: Self-pay

## 2020-11-26 ENCOUNTER — Inpatient Hospital Stay: Payer: Medicare Other | Attending: Hematology and Oncology

## 2020-11-26 ENCOUNTER — Encounter: Payer: Self-pay | Admitting: Hematology and Oncology

## 2020-11-26 DIAGNOSIS — C5702 Malignant neoplasm of left fallopian tube: Secondary | ICD-10-CM

## 2020-11-26 DIAGNOSIS — Z5112 Encounter for antineoplastic immunotherapy: Secondary | ICD-10-CM | POA: Diagnosis not present

## 2020-11-26 DIAGNOSIS — C762 Malignant neoplasm of abdomen: Secondary | ICD-10-CM | POA: Diagnosis not present

## 2020-11-26 DIAGNOSIS — D61818 Other pancytopenia: Secondary | ICD-10-CM

## 2020-11-26 DIAGNOSIS — C5700 Malignant neoplasm of unspecified fallopian tube: Secondary | ICD-10-CM

## 2020-11-26 DIAGNOSIS — R030 Elevated blood-pressure reading, without diagnosis of hypertension: Secondary | ICD-10-CM

## 2020-11-26 DIAGNOSIS — Z7189 Other specified counseling: Secondary | ICD-10-CM

## 2020-11-26 LAB — CBC WITH DIFFERENTIAL (CANCER CENTER ONLY)
Abs Immature Granulocytes: 0.01 10*3/uL (ref 0.00–0.07)
Basophils Absolute: 0 10*3/uL (ref 0.0–0.1)
Basophils Relative: 0 %
Eosinophils Absolute: 0 10*3/uL (ref 0.0–0.5)
Eosinophils Relative: 1 %
HCT: 26.1 % — ABNORMAL LOW (ref 36.0–46.0)
Hemoglobin: 8.9 g/dL — ABNORMAL LOW (ref 12.0–15.0)
Immature Granulocytes: 0 %
Lymphocytes Relative: 25 %
Lymphs Abs: 0.8 10*3/uL (ref 0.7–4.0)
MCH: 33.6 pg (ref 26.0–34.0)
MCHC: 34.1 g/dL (ref 30.0–36.0)
MCV: 98.5 fL (ref 80.0–100.0)
Monocytes Absolute: 0.5 10*3/uL (ref 0.1–1.0)
Monocytes Relative: 13 %
Neutro Abs: 2.1 10*3/uL (ref 1.7–7.7)
Neutrophils Relative %: 61 %
Platelet Count: 207 10*3/uL (ref 150–400)
RBC: 2.65 MIL/uL — ABNORMAL LOW (ref 3.87–5.11)
RDW: 13.3 % (ref 11.5–15.5)
WBC Count: 3.4 10*3/uL — ABNORMAL LOW (ref 4.0–10.5)
nRBC: 0 % (ref 0.0–0.2)

## 2020-11-26 LAB — CMP (CANCER CENTER ONLY)
ALT: 13 U/L (ref 0–44)
AST: 15 U/L (ref 15–41)
Albumin: 3.6 g/dL (ref 3.5–5.0)
Alkaline Phosphatase: 96 U/L (ref 38–126)
Anion gap: 8 (ref 5–15)
BUN: 14 mg/dL (ref 8–23)
CO2: 23 mmol/L (ref 22–32)
Calcium: 8.7 mg/dL — ABNORMAL LOW (ref 8.9–10.3)
Chloride: 105 mmol/L (ref 98–111)
Creatinine: 0.9 mg/dL (ref 0.44–1.00)
GFR, Estimated: >60 mL/min (ref >60)
Glucose, Bld: 120 mg/dL — ABNORMAL HIGH (ref 70–99)
Potassium: 4.3 mmol/L (ref 3.5–5.1)
Sodium: 136 mmol/L (ref 135–145)
Total Bilirubin: <0.2 mg/dL — ABNORMAL LOW (ref 0.3–1.2)
Total Protein: 6.6 g/dL (ref 6.5–8.1)

## 2020-11-26 LAB — SAMPLE TO BLOOD BANK

## 2020-11-26 MED ORDER — PROCHLORPERAZINE MALEATE 10 MG PO TABS
ORAL_TABLET | ORAL | Status: AC
  Filled 2020-11-26: qty 1

## 2020-11-26 MED ORDER — PROCHLORPERAZINE MALEATE 10 MG PO TABS
10.0000 mg | ORAL_TABLET | Freq: Once | ORAL | Status: AC
  Administered 2020-11-26: 10 mg via ORAL

## 2020-11-26 MED ORDER — SODIUM CHLORIDE 0.9% FLUSH
10.0000 mL | INTRAVENOUS | Status: DC | PRN
  Administered 2020-11-26: 10 mL
  Filled 2020-11-26: qty 10

## 2020-11-26 MED ORDER — HEPARIN SOD (PORK) LOCK FLUSH 100 UNIT/ML IV SOLN
500.0000 [IU] | Freq: Once | INTRAVENOUS | Status: AC | PRN
  Administered 2020-11-26: 500 [IU]
  Filled 2020-11-26: qty 5

## 2020-11-26 MED ORDER — SODIUM CHLORIDE 0.9 % IV SOLN
Freq: Once | INTRAVENOUS | Status: AC
  Filled 2020-11-26: qty 250

## 2020-11-26 MED ORDER — TOPOTECAN HCL CHEMO INJECTION 4 MG
3.0000 mg/m2 | Freq: Once | INTRAVENOUS | Status: AC
  Administered 2020-11-26: 5.3 mg via INTRAVENOUS
  Filled 2020-11-26: qty 5.3

## 2020-11-26 NOTE — Patient Instructions (Addendum)
[facility] Discharge Instructions for Patients Receiving Chemotherapy  Today you received the following chemotherapy agent: Topotecan  To help prevent nausea and vomiting after your treatment, we encourage you to take your nausea medication as directed by your MD.   If you develop nausea and vomiting that is not controlled by your nausea medication, call the clinic.   BELOW ARE SYMPTOMS THAT SHOULD BE REPORTED IMMEDIATELY:  *FEVER GREATER THAN 100.5 F  *CHILLS WITH OR WITHOUT FEVER  NAUSEA AND VOMITING THAT IS NOT CONTROLLED WITH YOUR NAUSEA MEDICATION  *UNUSUAL SHORTNESS OF BREATH  *UNUSUAL BRUISING OR BLEEDING  TENDERNESS IN MOUTH AND THROAT WITH OR WITHOUT PRESENCE OF ULCERS  *URINARY PROBLEMS  *BOWEL PROBLEMS  UNUSUAL RASH Items with * indicate a potential emergency and should be followed up as soon as possible.  Feel free to call the clinic should you have any questions or concerns. The clinic phone number is [phone].  Please show the [facility] at check-in to the Emergency Department and triage nurse.  Topotecan injection What is this medicine? TOPOTECAN (TOE poe TEE kan) is a chemotherapy drug. It is used to treat lung cancer, ovarian cancer, and cervical cancer. This medicine may be used for other purposes; ask your health care provider or pharmacist if you have questions. COMMON BRAND NAME(S): Hycamtin What should I tell my health care provider before I take this medicine? They need to know if you have any of these conditions:  immune system problems  infection (especially a virus infection such as chickenpox, cold sores, or herpes)  kidney disease  low blood counts, like low white cell, platelet, or red cell counts  lung or breathing disease, like asthma  scarring or thickening of the lungs  an unusual or allergic reaction to topotecan, other medicines, foods, dyes, or preservatives  pregnant or trying to get  pregnant  breast-feeding How should I use this medicine? This medicine is for infusion into a vein. It is usually given by a health care professional in a hospital or clinic setting. Talk to your pediatrician regarding the use of this medicine in children. Special care may be needed. Overdosage: If you think you have taken too much of this medicine contact a poison control center or emergency room at once. NOTE: This medicine is only for you. Do not share this medicine with others. What if I miss a dose? It is important not to miss your dose. Call your doctor or health care professional if you are unable to keep an appointment. What may interact with this medicine?  amiodarone  azithromycin  captopril  carvedilol  certain medications for fungal infections like ketoconazole and itraconazole  clarithromycin  conivaptan  cyclosporine  dronedarone  eltrombopag  erythromycin  felodipine  grapefruit juice  lopinavir  quercetin  quinidine  ranolazine  ritonavir  ticagrelor  verapamil This list may not describe all possible interactions. Give your health care provider a list of all the medicines, herbs, non-prescription drugs, or dietary supplements you use. Also tell them if you smoke, drink alcohol, or use illegal drugs. Some items may interact with your medicine. What should I watch for while using this medicine? This drug may make you feel generally unwell. This is not uncommon, as chemotherapy can affect healthy cells as well as cancer cells. Report any side effects. Continue your course of treatment even though you feel ill unless your doctor tells you to stop. Call your doctor or health care professional for advice  if you get a fever, chills or sore throat, or other symptoms of a cold or flu. Do not treat yourself. This drug decreases your body's ability to fight infections. Try to avoid being around people who are sick. This medicine may increase your risk to  bruise or bleed. Call your doctor or health care professional if you notice any unusual bleeding. Be careful brushing and flossing your teeth or using a toothpick because you may get an infection or bleed more easily. If you have any dental work done, tell your dentist you are receiving this medicine. Avoid taking products that contain aspirin, acetaminophen, ibuprofen, naproxen, or ketoprofen unless instructed by your doctor. These medicines may hide a fever. Do not become pregnant while taking this medicine or for 6 months after stopping it. Women should inform their doctor if they wish to become pregnant or think they might be pregnant. Men should not father a child while taking this medicine and for 3 months after stopping it. There is a potential for serious side effects to an unborn child. Talk to your health care professional or pharmacist for more information. Do not breast-feed an infant while taking this medicine. In females, this medicine may make it more difficult to get pregnant. This medicine has also caused reduced sperm counts in some men. This may interfere with the ability to father a child. You should talk to your doctor or health care professional if you are concerned about your fertility. What side effects may I notice from receiving this medicine? Side effects that you should report to your doctor or health care professional as soon as possible:  allergic reactions like skin rash, itching or hives, swelling of the face, lips, or tongue  breathing difficulties  diarrhea  signs of decreased platelets or bleeding - bruising, pinpoint red spots on the skin, black, tarry stools, blood in the urine  signs of decreased red blood cells - unusually weak or tired, feeling faint or lightheaded, falls  signs and symptoms of infection like fever or chills; cough; sore throat; pain or trouble passing urine Side effects that usually do not require medical attention (report to your doctor or  health care professional if they continue or are bothersome):  hair loss  headache  loss of appetite  nausea, vomiting  stomach pain This list may not describe all possible side effects. Call your doctor for medical advice about side effects. You may report side effects to FDA at 1-800-FDA-1088. Where should I keep my medicine? Keep out of the reach of children. This drug is usually given in a hospital or clinic and will not be stored at home. In rare cases, this medicine may be given at home. If you are using this medicine at home, you will be instructed on how to store this medicine. Throw away any unused medicine after the expiration date on the label. NOTE: This sheet is a summary. It may not cover all possible information. If you have questions about this medicine, talk to your doctor, pharmacist, or health care provider.  2021 [employer] Standard (2017-06-29 11:35:12)

## 2020-11-26 NOTE — Assessment & Plan Note (Signed)
She has no signs or symptoms of abdominal pain, nausea or changes in bowel habits Observe closely for now

## 2020-11-26 NOTE — Assessment & Plan Note (Signed)
She has persistent elevated blood pressure which she attributed to anxiety Observe closely for now

## 2020-11-26 NOTE — Assessment & Plan Note (Signed)
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

## 2020-11-26 NOTE — Assessment & Plan Note (Signed)
Previously, we have reviewed imaging studies and plan of care The patient would like to pursue further palliative chemotherapy She is scheduled for weekly topotecan I have prescribed upfront dose reduction due to her chronic pancytopenia and her age I warned the patient that she might need further dose adjustment or changes to her current schedule if she developed worsening pancytopenia in the future Currently, she is not symptomatic

## 2020-11-26 NOTE — Progress Notes (Signed)
[place] OFFICE PROGRESS NOTE  Patient Care Team: [name], MD as PCP - General (Internal Medicine)  ASSESSMENT & PLAN:  Fallopian tube cancer, carcinoma ([place]) Previously, we have reviewed imaging studies and plan of care The patient would like to pursue further palliative chemotherapy She is scheduled for weekly topotecan I have prescribed upfront dose reduction due to her chronic pancytopenia and her age I warned the patient that she might need further dose adjustment or changes to her current schedule if she developed worsening pancytopenia in the future Currently, she is not symptomatic  Pancytopenia, 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   All questions were answered. The patient knows to call the clinic with any problems, questions or concerns. The total time spent in the appointment was 20 minutes encounter with patients including review of chart and various tests results, discussions about plan of care and coordination of care plan   [name], MD 11/26/2020 2:05 PM  INTERVAL HISTORY: Please see below for problem oriented charting. 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 Extensive omental nodularity highly worrisome for peritoneal carcinomatosis. Late recurrence of melanoma can present as peritoneal carcinomatosis. Ovarian cancer more commonly presents in this manner. Patient does have a predominately low density right adnexal lesion measuring up to 3.5 cm, although this lesion is not typical for ovarian cancer. 2. No evidence of bowel or ureteral obstruction. 3. No other definite signs of metastatic disease. There are small indeterminate low-density hepatic lesions.   02/08/2016 Tumor Marker   Patient's tumor was tested for the following markers: CA-125 Results of the tumor marker test revealed 656.2   02/15/2016 Procedure   CT-guided core biopsy performed of omental mass just deep to the abdominal wall.   02/15/2016 Pathology Results   Omentum, biopsy, left - PAPILLARY SEROUS NEOPLASM, SEE COMMENT. Microscopic Comment There are papillary collections of largely low grade appearing cells with numerous psammoma bodies. Given the limited material it is difficult to distinguish between invasive implants of a serous borderline tumor and serous carcinoma, especially given the low grade appearance.   02/18/2016 Pathology Results   1. Omentum, resection for tumor INVASIVE IMPLANT OF HIGH GRADE SEROUS CARCINOMA 2. Uterus +/- tubes/ovaries, neoplastic HIGH GRADE SEROUS CARCINOMA INVOLVING LEFT TUBAL FIMBRIA SEROUS CARCINOMA WITH PREDOMINANT PSAMMOMA BODIES IMPLANT AT UTERUS SEROSA, BILATERAL FALLOPIAN TUBAL SEROSA, AND ANTERIOR PERITONEAL REFLECTION CERVIX: HISTOLOGICAL UNREMARKABLE ENDOMETRIUM: INACTIVE ENDOMETRIUM MYOMETRIUM: LEIOMYOMA LEFT OVARY:  CYSTADENOFIBROMA RIGHT OVARY AND  FALLOPIAN TUBE: HISTOLOGICAL UNREMARKABLE Microscopic Comment 2. ONCOLOGY TABLE - FALLOPIAN TUBE 1. Specimen, including laterality: Omentum, uterus, bilateral ovaries and fallopian tubes 2. Procedure: Hysterectomy, bilateral salpingo-oophorectomy and tumor debulking omentectomy 3. Lymph node sampling performed: No 4. Tumor site: uterus serosa, bilateral fallopian tubal serosa, peritoneal and omentum 5. Tumor location in fallopian tube: Left fallopian tubal fimbria 6. Specimen integrity (intact/ruptured/disrupted): Intact 7. Tumor size (cm): multi focal invasive omentum implant greater than 2 cm, left fallopian tube tumor 0.8 cm. 8. Histologic type: Serous carcinoma 9. Grade: 3 10. Microscopic tumor extension: uterus serosa, bilateral fallopian tubal serosa, peritoneal and omentum 11. Margins: NA 12. Lymph-Vascular invasion: identified 13. Lymph nodes: # examined: 0; # positive: NA 14. TNM: pT3c, pNx 15. FIGO Stage (based on pathologic findings, needs clinical correlation: IIIC 16. Comment: High grade serous carcinoma multifocally and extensively involves left fallopian tubal fimbria, omentum, uterus serosa, bilateral fallopian tubal serosa and peritoneal. At the left fallopian tube there are small foci of serous tubal intraepithelial carcinoma identified, so we conclude the carcinoma is fallopian tube origin   03/09/2016 Tumor Marker   Patient's tumor was tested for the following markers: CA-125 Results of the tumor marker test revealed 154.4   03/15/2016 Procedure   Technically successful right IJ power-injectable port catheter placement. Ready for routine use.   03/18/2016 - 07/13/2016 Chemotherapy   The patient had 6 cycles of carboplatin and taxol   04/14/2016 Tumor Marker   Patient's tumor was tested for the following markers: CA-125 Results of the tumor marker test revealed 36.7   04/25/2016 Genetic Testing   Patient has genetic testing done for  germline mutation Results revealed patient has no mutation   04/28/2016 Tumor Marker   Patient's tumor was tested for the following markers: CA-125 Results of the tumor marker test revealed 24.6   06/21/2016 Tumor Marker   Patient's tumor was tested for the following markers: CA-125 Results of the tumor marker test revealed 16.6   08/01/2016 Tumor Marker   Patient's tumor was tested for the following markers: CA-125 Results of the tumor marker test revealed 17.2   08/05/2016 Imaging   Interval TAH-BSO. No evidence of residual pelvic mass or metastatic disease within the abdomen or pelvis. No other acute findings.    11/04/2016 Tumor Marker   Patient's tumor was tested for the following markers: CA-125 Results of the tumor marker test revealed 13.6   01/20/2017 Tumor Marker   Patient's tumor was tested for the following markers: CA-125 Results of the tumor marker test revealed 13.8   04/14/2017 Tumor Marker   Patient's tumor was tested for the following markers: CA-125 Results of the tumor marker test revealed 16.1   06/13/2017 Imaging   No acute findings.  No mass or hernia identified.  Colonic diverticulosis, without radiographic evidence of diverticulitis.  Aortic atherosclerosis.   07/03/2017 Tumor Marker   Patient's tumor was tested for the following markers: CA-125 Results of the tumor marker test revealed 18.4   09/19/2017 Tumor Marker   Patient's tumor was tested for the following markers: CA-125 Results of the tumor marker test revealed 18.5   01/23/2018 Tumor Marker   Patient's tumor was tested for the following markers: CA-125 Results of the tumor marker test revealed 35.4   01/30/2018 Imaging   1. No evidence of local fallopian tube carcinoma recurrence within the pelvis. Post hysterectomy. 2. No evidence of metastatic peritoneal disease or omental disease. No solid organ metastasis.    03/20/2018 Tumor Marker   Patient's  tumor was tested for the following  markers: CA-125 Results of the tumor marker test revealed 73   05/02/2018 Tumor Marker   Patient's tumor was tested for the following markers: CA-125 Results of the tumor marker test revealed 127.1   05/08/2018 Imaging   CT abdomen and pelvis 1. 7 cm left subdiaphragmatic collection of loculated fluid versus low-density soft tissue. Given the patient's history of rising CA 125, metastatic disease considered highly likely.  2. Areas of fluid or recurrent disease identified in the right lower quadrant adjacent to the cecum and along right lower quadrant small bowel loops.   05/14/2018 Tumor Marker   Patient's tumor was tested for the following markers: CA-125 Results of the tumor marker test revealed 166    Genetic Testing   Patient has genetic testing done for ER/PR and MMR. Results revealed patient has the following on 02/18/2016 surgical pathology: ER 20%, PR 0% MMR: normal    Genetic Testing   Patient has genetic testing done for MSI. Results revealed patient has the following: MSI stable   05/18/2018 - 12/31/2018 Chemotherapy   The patient had carboplatin and Doxil   07/16/2018 Tumor Marker   Patient's tumor was tested for the following markers: CA-125 Results of the tumor marker test revealed 28.7   08/10/2018 Imaging   CT imaging:  Decreased size of peritoneal soft tissue masses in left upper quadrant, consistent with decreased metastatic disease.  No new or progressive metastatic disease. No other acute findings.  Colonic diverticulosis, without radiographic evidence of diverticulitis.  Stable small paraumbilical ventral hernia containing transverse colon.   08/13/2018 Tumor Marker   Patient's tumor was tested for the following markers: CA-125 Results of the tumor marker test revealed 21.6   08/24/2018 Echocardiogram   LV EF: 60% -  65%   10/15/2018 Tumor Marker   Patient's tumor was tested for the following markers: CA-125 Results of the tumor marker test  revealed 20.1   10/19/2018 Imaging   Ct scan of abdomen and pelvis Status post hysterectomy, bilateral salpingo-oophorectomy, and omentectomy.  Two peritoneal implants in the left upper abdomen are stable versus mildly decreased, as above. No abdominopelvic ascites.  No evidence of new/progressive metastatic disease.     11/19/2018 Tumor Marker   Patient's tumor was tested for the following markers: CA-125 Results of the tumor marker test revealed 21.7   11/23/2018 Echocardiogram   1. The left ventricle has normal systolic function of 47-82%. The cavity size was normal. There is no increased left ventricular wall thickness. Echo evidence of impaired diastolic relaxation.  2. The right ventricle has normal systolic function. The cavity was normal. There is no increase in right ventricular wall thickness.  3. The mitral valve is normal in structure. There is mild mitral annular calcification present.  4. The tricuspid valve is normal in structure.  5. The aortic valve is tricuspid There is mild thickening of the aortic valve.  6. The pulmonic valve was normal in structure. Pulmonic valve regurgitation is mild by color flow Doppler.  7. Normal LV systolic function; mild diastolic dysfunction.   12/31/2018 Tumor Marker   Patient's tumor was tested for the following markers: CA-125 Results of the tumor marker test revealed 17.1   01/31/2019 Imaging   1. Left upper quadrant peritoneal implants described previously have decreased in the interval. No new or progressive findings in the abdomen/pelvis today. No free fluid. 2. Right paraumbilical ventral hernia contains a small knuckle of transverse colon without complicating features. 3.  Aortic Atherosclerois (ICD10-170.0)  Aortic Atherosclerosis (ICD10-I70.0).   01/31/2019 Tumor Marker   Patient's tumor was tested for the following markers: CA-125 Results of the tumor marker test revealed 18.9   02/11/2019 - 03/13/2019 Chemotherapy   The  patient is taking Niraparib. She self discontinued after 1 month   02/25/2019 Tumor Marker   Patient's tumor was tested for the following markers: CA-125 Results of the tumor marker test revealed 18.6   05/13/2019 Tumor Marker   Patient's tumor was tested for the following markers: CA-125 Results of the tumor marker test revealed 230   05/22/2019 Imaging   1. Increased size of 4.6 cm soft tissue mass in the gastrosplenic ligament, consistent with metastatic disease. 2. No other sites of metastatic disease identified within the abdomen or pelvis. 3. Colonic diverticulosis. No radiographic evidence of diverticulitis. 4. Stable small right paraumbilical hernia containing transverse colon.   Aortic Atherosclerosis (ICD10-I70.0).   05/31/2019 - 08/22/2019 Chemotherapy   The patient had bevacizumab and Taxol for chemotherapy treatment.     05/31/2019 Tumor Marker   Patient's tumor was tested for the following markers: CA-125 Results of the tumor marker test revealed 84   06/21/2019 Tumor Marker   Patient's tumor was tested for the following markers: CA-125 Results of the tumor marker test revealed 49.4.   07/19/2019 Tumor Marker   Patient's tumor was tested for the following markers: CA-125. Results of the tumor marker test revealed 44.5   08/22/2019 Imaging   1. Increase in size of 5.0 cm mass in the gastro splenic ligament consistent with metastatic disease. 2. No additional sites of disease identified within the abdomen or pelvis. 3. Unchanged paraumbilical hernia containing nonobstructed loop of large bowel. 4.  Aortic Atherosclerosis (ICD10-I70.0).     08/30/2019 Tumor Marker   Patient's tumor was tested for the following markers: CA-125 Results of the tumor marker test revealed 71.5   08/30/2019 - 05/11/2020 Chemotherapy   The patient had gemzar for chemotherapy treatment.     09/27/2019 Tumor Marker   Patient's tumor was tested for the following markers: CA-125. Results of the  tumor marker test revealed 38.5   10/14/2019 Tumor Marker   Patient's tumor was tested for the following markers: CA-125 Results of the tumor marker test revealed 40.8   10/21/2019 Tumor Marker   Patient's tumor was tested for the following markers: CA-125 Results of the tumor marker test revealed 34.8.   11/22/2019 Imaging   1. Interval decrease in size of left upper quadrant cystic and solid peritoneal lesion. 2. No new sites of disease. 3. Unchanged periumbilical hernia containing a nonobstructed loop of small bowel. 4.  Aortic Atherosclerosis (ICD10-I70.0).     11/25/2019 Tumor Marker   Patient's tumor was tested for the following markers: CA-125 Results of the tumor marker test revealed 22.7   12/23/2019 Tumor Marker   Patient's tumor was tested for the following markers: CA-125 Results of the tumor marker test revealed 26.3   01/20/2020 Tumor Marker   Patient's tumor was tested for the following markers: CA-125 Results of the tumor marker test revealed 28.3   02/14/2020 Imaging   1. 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 2. No new or progressive disease identified within the abdomen or pelvis. 3. Colonic diverticulosis. No radiographic evidence of diverticulitis. 4. Small paraumbilical ventral hernia.     02/17/2020 Tumor Marker   Patient's tumor was tested for the following markers: CA-125 Results of the tumor marker test  revealed 40.1   05/11/2020 Tumor Marker   Patient's tumor was tested for the following markers: CA-125 Results of the tumor marker test revealed 113.   05/22/2020 Imaging   1. 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 2. No evidence of ascites, bowel or ureteral obstruction. 3. 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 No evidence of incarceration or  obstruction. 4. Aortic Atherosclerosis (ICD10-I70.0).   05/22/2020 Tumor Marker   Patient's tumor was tested for the following markers: CA-125 Results of the tumor marker test revealed 118   05/29/2020 Echocardiogram   1. Normal GLS -19.8. Left ventricular ejection fraction, by estimation, is 55 to 60%. The left ventricle has normal function. The left ventricle has no regional wall motion abnormalities. Left ventricular diastolic parameters were normal.  2. Right ventricular systolic function is normal. The right ventricular size is normal.  3. The mitral valve is degenerative. Trivial mitral valve regurgitation. No evidence of mitral stenosis.  4. The aortic valve is tricuspid. Aortic valve regurgitation is not visualized. Mild to moderate aortic valve sclerosis/calcification is present, without any evidence of aortic stenosis.  5. The inferior vena cava is normal in size with greater than 50% respiratory variability, suggesting right atrial pressure of 3 mmHg.   06/01/2020 - 10/29/2020 Chemotherapy   The patient had carboplatin and doxil for chemotherapy treatment for 6 cycles    06/01/2020 Tumor Marker   Patient's tumor was tested for the following markers: CA-125 Results of the tumor marker test revealed 126.   06/29/2020 Tumor Marker   Patient's tumor was tested for the following markers: CA-125. Results of the tumor marker test revealed 108   07/27/2020 Tumor Marker   Patient's tumor was tested for the following markers: CA-125. Results of the tumor marker test revealed 89.1   08/18/2020 Imaging   1. Interval positive response to therapy. 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 No new or progressive metastatic disease in the abdomen or pelvis. 2. Chronic findings include: Small hiatal hernia. Mild to moderate colonic diverticulosis. Aortic Atherosclerosis (ICD10-I70.0).   08/24/2020 Tumor Marker   Patient's tumor was tested  for the following markers: CA-125 Results of the tumor marker test revealed 81.2   08/25/2020 Echocardiogram   1. Left ventricular ejection fraction, by estimation, is 60 to 65%. The left ventricle has normal function. The left ventricle has no regional wall motion abnormalities. Left ventricular diastolic parameters were normal. The average left ventricular global longitudinal strain is -21.4 %. The global longitudinal strain is normal.  2. Right ventricular systolic function is normal. The right ventricular size is normal.  3. The mitral valve is normal in structure. No evidence of mitral valve regurgitation. No evidence of mitral stenosis.  4. The aortic valve is tricuspid. There is moderate calcification of the aortic valve. There is moderate thickening of the aortic valve. Aortic valve regurgitation is not visualized. Mild to moderate aortic valve sclerosis/calcification is present, without any evidence of aortic stenosis.  5. Aortic dilatation noted. There is mild dilatation of the aortic root, measuring 37 mm.  6. The inferior vena cava is normal in size with greater than 50% respiratory variability, suggesting right atrial pressure of 3 mmHg.   09/21/2020 Tumor Marker   Patient's tumor was tested for the following markers: CA-125 Results of the tumor marker test revealed 72.7.   10/01/2020 Tumor  Marker   Patient's tumor was tested for the following markers: CA-125 Results of the tumor marker test revealed 80.9   10/29/2020 Tumor Marker   Patient's tumor was tested for the following markers: CA-125 Results of the tumor marker test revealed 88   11/12/2020 Imaging   1. 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 2. Small right paramidline paraumbilical ventral hernia containing a short segment of mid transverse colon, without evidence of bowel incarceration or obstruction at this time. 3. Colonic diverticulosis without evidence of acute diverticulitis at  this time. 4. Aortic atherosclerosis, in addition to left main coronary artery disease. Please note that although the presence of coronary artery calcium documents the presence of coronary artery disease, the severity of this disease and any potential stenosis cannot be assessed on this non-gated CT examination. Assessment for potential risk factor modification, dietary therapy or pharmacologic therapy may be warranted, if clinically indicated. 5. Additional incidental findings, as above.   11/26/2020 -  Chemotherapy    Patient is on Treatment Plan: OVARIAN TOPOTECAN D1,8,15 Q28D        REVIEW OF SYSTEMS:   Constitutional: Denies fevers, chills or abnormal weight loss Eyes: Denies blurriness of vision Ears, nose, mouth, throat, and face: Denies mucositis or sore throat Respiratory: Denies cough, dyspnea or wheezes Cardiovascular: Denies palpitation, chest discomfort or lower extremity swelling Gastrointestinal:  Denies nausea, heartburn or change in bowel habits Skin: Denies abnormal skin rashes Lymphatics: Denies new lymphadenopathy or easy bruising Neurological:Denies numbness, tingling or new weaknesses Behavioral/Psych: Mood is stable, no new changes  All other systems were reviewed with the patient and are negative.  I have reviewed the past medical history, past surgical history, social history and family history with the patient and they are unchanged from previous note.  ALLERGIES:  has No Known Allergies.  MEDICATIONS:  Current Outpatient Medications  Medication Sig Dispense Refill  . Cholecalciferol (VITAMIN D) 2000 units CAPS Take 1 capsule by mouth daily.    Marland Kitchen ezetimibe (ZETIA) 10 MG tablet Take 10 mg by mouth daily.    Marland Kitchen lidocaine-prilocaine (EMLA) cream Apply to affected area once 30 g 3  . ondansetron (ZOFRAN) 8 MG tablet Take 1 tablet (8 mg total) by mouth 2 (two) times daily as needed (Nausea or vomiting). 30 tablet 1  . prochlorperazine (COMPAZINE) 10 MG tablet  Take 1 tablet (10 mg total) by mouth every 6 (six) hours as needed (Nausea or vomiting). 60 tablet 1   No current facility-administered medications for this visit.    PHYSICAL EXAMINATION: ECOG PERFORMANCE STATUS: 1 - Symptomatic but completely ambulatory  Vitals:   11/26/20 1351  BP: (!) 173/73  Pulse: 89  Resp: 18  Temp: 98 F (36.7 C)  SpO2: 100%   Filed Weights   11/26/20 1351  Weight: 151 lb 9.6 oz (68.8 kg)    GENERAL:alert, no distress and comfortable NEURO: alert & oriented x 3 with fluent speech, no focal motor/sensory deficits  LABORATORY DATA:  I have reviewed the data as listed    Component Value Date/Time   NA 136 10/29/2020 0752   NA 139 06/07/2017 1430   K 4.3 10/29/2020 0752   K 3.8 06/07/2017 1430   CL 105 10/29/2020 0752   CO2 25 10/29/2020 0752   CO2 27 06/07/2017 1430   GLUCOSE 127 (H) 10/29/2020 0752   GLUCOSE 124 06/07/2017 1430   BUN 20 10/29/2020 0752   BUN 14.0 06/07/2017 1430   CREATININE 0.95 10/29/2020  0752   CREATININE 0.8 06/07/2017 1430   CALCIUM 9.0 10/29/2020 0752   CALCIUM 9.3 06/07/2017 1430   PROT 6.4 (L) 10/29/2020 0752   PROT 6.6 09/30/2016 1027   ALBUMIN 3.4 (L) 10/29/2020 0752   ALBUMIN 3.5 09/30/2016 1027   AST 13 (L) 10/29/2020 0752   AST 18 09/30/2016 1027   ALT 11 10/29/2020 0752   ALT 21 09/30/2016 1027   ALKPHOS 88 10/29/2020 0752   ALKPHOS 77 09/30/2016 1027   BILITOT 0.4 10/29/2020 0752   BILITOT 0.37 09/30/2016 1027   GFRNONAA >60 10/29/2020 0752   GFRAA >60 06/29/2020 0913   GFRAA >60 01/20/2020 1029    No results found for: SPEP, UPEP  Lab Results  Component Value Date   WBC 3.4 (L) 11/26/2020   NEUTROABS 2.1 11/26/2020   HGB 8.9 (L) 11/26/2020   HCT 26.1 (L) 11/26/2020   MCV 98.5 11/26/2020   PLT 207 11/26/2020      Chemistry      Component Value Date/Time   NA 136 10/29/2020 0752   NA 139 06/07/2017 1430   K 4.3 10/29/2020 0752   K 3.8 06/07/2017 1430   CL 105 10/29/2020 0752   CO2 25  10/29/2020 0752   CO2 27 06/07/2017 1430   BUN 20 10/29/2020 0752   BUN 14.0 06/07/2017 1430   CREATININE 0.95 10/29/2020 0752   CREATININE 0.8 06/07/2017 1430      Component Value Date/Time   CALCIUM 9.0 10/29/2020 0752   CALCIUM 9.3 06/07/2017 1430   ALKPHOS 88 10/29/2020 0752   ALKPHOS 77 09/30/2016 1027   AST 13 (L) 10/29/2020 0752   AST 18 09/30/2016 1027   ALT 11 10/29/2020 0752   ALT 21 09/30/2016 1027   BILITOT 0.4 10/29/2020 0752   BILITOT 0.37 09/30/2016 1027

## 2020-11-27 ENCOUNTER — Telehealth: Payer: Self-pay | Admitting: *Deleted

## 2020-11-27 LAB — CA 125: Cancer Antigen (CA) 125: 89.1 U/mL — ABNORMAL HIGH (ref 0.0–38.1)

## 2020-12-03 ENCOUNTER — Inpatient Hospital Stay: Payer: Medicare Other

## 2020-12-03 ENCOUNTER — Other Ambulatory Visit: Payer: Self-pay | Admitting: Hematology and Oncology

## 2020-12-03 ENCOUNTER — Other Ambulatory Visit: Payer: Self-pay

## 2020-12-03 VITALS — BP 133/70 | HR 78 | Temp 98.2°F | Resp 18 | Wt 148.0 lb

## 2020-12-03 DIAGNOSIS — Z5112 Encounter for antineoplastic immunotherapy: Secondary | ICD-10-CM | POA: Diagnosis not present

## 2020-12-03 DIAGNOSIS — C5702 Malignant neoplasm of left fallopian tube: Secondary | ICD-10-CM

## 2020-12-03 DIAGNOSIS — Z7189 Other specified counseling: Secondary | ICD-10-CM

## 2020-12-03 DIAGNOSIS — C5700 Malignant neoplasm of unspecified fallopian tube: Secondary | ICD-10-CM

## 2020-12-03 DIAGNOSIS — D61818 Other pancytopenia: Secondary | ICD-10-CM

## 2020-12-03 LAB — CBC WITH DIFFERENTIAL (CANCER CENTER ONLY)
Abs Immature Granulocytes: 0.02 10*3/uL (ref 0.00–0.07)
Basophils Absolute: 0 10*3/uL (ref 0.0–0.1)
Basophils Relative: 1 %
Eosinophils Absolute: 0 10*3/uL (ref 0.0–0.5)
Eosinophils Relative: 1 %
HCT: 24.9 % — ABNORMAL LOW (ref 36.0–46.0)
Hemoglobin: 8.4 g/dL — ABNORMAL LOW (ref 12.0–15.0)
Immature Granulocytes: 1 %
Lymphocytes Relative: 35 %
Lymphs Abs: 0.9 10*3/uL (ref 0.7–4.0)
MCH: 32.9 pg (ref 26.0–34.0)
MCHC: 33.7 g/dL (ref 30.0–36.0)
MCV: 97.6 fL (ref 80.0–100.0)
Monocytes Absolute: 0.4 10*3/uL (ref 0.1–1.0)
Monocytes Relative: 14 %
Neutro Abs: 1.3 10*3/uL — ABNORMAL LOW (ref 1.7–7.7)
Neutrophils Relative %: 48 %
Platelet Count: 146 10*3/uL — ABNORMAL LOW (ref 150–400)
RBC: 2.55 MIL/uL — ABNORMAL LOW (ref 3.87–5.11)
RDW: 12.6 % (ref 11.5–15.5)
WBC Count: 2.6 10*3/uL — ABNORMAL LOW (ref 4.0–10.5)
nRBC: 0 % (ref 0.0–0.2)

## 2020-12-03 LAB — CMP (CANCER CENTER ONLY)
ALT: 12 U/L (ref 0–44)
AST: 14 U/L — ABNORMAL LOW (ref 15–41)
Albumin: 3.6 g/dL (ref 3.5–5.0)
Alkaline Phosphatase: 96 U/L (ref 38–126)
Anion gap: 5 (ref 5–15)
BUN: 20 mg/dL (ref 8–23)
CO2: 23 mmol/L (ref 22–32)
Calcium: 8.6 mg/dL — ABNORMAL LOW (ref 8.9–10.3)
Chloride: 106 mmol/L (ref 98–111)
Creatinine: 0.93 mg/dL (ref 0.44–1.00)
GFR, Estimated: >60 mL/min (ref >60)
Glucose, Bld: 88 mg/dL (ref 70–99)
Potassium: 4.5 mmol/L (ref 3.5–5.1)
Sodium: 134 mmol/L — ABNORMAL LOW (ref 135–145)
Total Bilirubin: 0.3 mg/dL (ref 0.3–1.2)
Total Protein: 6.3 g/dL — ABNORMAL LOW (ref 6.5–8.1)

## 2020-12-03 LAB — SAMPLE TO BLOOD BANK

## 2020-12-03 MED ORDER — PROCHLORPERAZINE MALEATE 10 MG PO TABS
ORAL_TABLET | ORAL | Status: AC
  Filled 2020-12-03: qty 1

## 2020-12-03 MED ORDER — PROCHLORPERAZINE MALEATE 10 MG PO TABS
10.0000 mg | ORAL_TABLET | Freq: Once | ORAL | Status: AC
  Administered 2020-12-03: 10 mg via ORAL

## 2020-12-03 MED ORDER — TOPOTECAN HCL CHEMO INJECTION 4 MG
3.0000 mg/m2 | Freq: Once | INTRAVENOUS | Status: AC
  Administered 2020-12-03: 5.3 mg via INTRAVENOUS
  Filled 2020-12-03: qty 5.3

## 2020-12-03 MED ORDER — SODIUM CHLORIDE 0.9 % IV SOLN
Freq: Once | INTRAVENOUS | Status: AC
  Filled 2020-12-03: qty 250

## 2020-12-03 MED ORDER — SODIUM CHLORIDE 0.9% FLUSH
10.0000 mL | INTRAVENOUS | Status: DC | PRN
  Administered 2020-12-03: 10 mL
  Filled 2020-12-03: qty 10

## 2020-12-03 MED ORDER — HEPARIN SOD (PORK) LOCK FLUSH 100 UNIT/ML IV SOLN
500.0000 [IU] | Freq: Once | INTRAVENOUS | Status: AC | PRN
  Administered 2020-12-03: 500 [IU]
  Filled 2020-12-03: qty 5

## 2020-12-03 NOTE — Patient Instructions (Signed)

## 2020-12-03 NOTE — Patient Instructions (Signed)
[facility] Discharge Instructions for Patients Receiving Chemotherapy  Today you received the following chemotherapy agents: topotecan.  To help prevent nausea and vomiting after your treatment, we encourage you to take your nausea medication as directed.   If you develop nausea and vomiting that is not controlled by your nausea medication, call the clinic.   BELOW ARE SYMPTOMS THAT SHOULD BE REPORTED IMMEDIATELY:  *FEVER GREATER THAN 100.5 F  *CHILLS WITH OR WITHOUT FEVER  NAUSEA AND VOMITING THAT IS NOT CONTROLLED WITH YOUR NAUSEA MEDICATION  *UNUSUAL SHORTNESS OF BREATH  *UNUSUAL BRUISING OR BLEEDING  TENDERNESS IN MOUTH AND THROAT WITH OR WITHOUT PRESENCE OF ULCERS  *URINARY PROBLEMS  *BOWEL PROBLEMS  UNUSUAL RASH Items with * indicate a potential emergency and should be followed up as soon as possible.  Feel free to call the clinic should you have any questions or concerns. The clinic phone number is [phone].  Please show the CHEMO ALERT CARD at check-in to the Emergency Department and triage nurse.   

## 2020-12-04 LAB — CA 125: Cancer Antigen (CA) 125: 84.3 U/mL — ABNORMAL HIGH (ref 0.0–38.1)

## 2020-12-05 LAB — CREATININE
Creatinine: 0.88 MG/DL (ref 0.55–1.02)
Creatinine: 0.88 MG/DL (ref 0.55–1.02)
EGFR IF NonAfrican American: >60 mL/min/{1.73_m2} (ref >60)
GFR African American: >60 mL/min/{1.73_m2} (ref >60)
GFR est AA: >60 mL/min/{1.73_m2} (ref >60)
GFR est non-AA: >60 mL/min/{1.73_m2} (ref >60)

## 2020-12-10 ENCOUNTER — Inpatient Hospital Stay (HOSPITAL_BASED_OUTPATIENT_CLINIC_OR_DEPARTMENT_OTHER): Payer: Medicare Other | Admitting: Hematology and Oncology

## 2020-12-10 ENCOUNTER — Inpatient Hospital Stay: Payer: Medicare Other

## 2020-12-10 ENCOUNTER — Other Ambulatory Visit: Payer: Self-pay

## 2020-12-10 ENCOUNTER — Encounter: Payer: Self-pay | Admitting: Hematology and Oncology

## 2020-12-10 DIAGNOSIS — Z5112 Encounter for antineoplastic immunotherapy: Secondary | ICD-10-CM | POA: Diagnosis not present

## 2020-12-10 DIAGNOSIS — Z7189 Other specified counseling: Secondary | ICD-10-CM

## 2020-12-10 DIAGNOSIS — D61818 Other pancytopenia: Secondary | ICD-10-CM | POA: Diagnosis not present

## 2020-12-10 DIAGNOSIS — C5700 Malignant neoplasm of unspecified fallopian tube: Secondary | ICD-10-CM | POA: Diagnosis not present

## 2020-12-10 DIAGNOSIS — Z95828 Presence of other vascular implants and grafts: Secondary | ICD-10-CM

## 2020-12-10 DIAGNOSIS — C5702 Malignant neoplasm of left fallopian tube: Secondary | ICD-10-CM

## 2020-12-10 LAB — CBC WITH DIFFERENTIAL (CANCER CENTER ONLY)
Abs Immature Granulocytes: 0.01 10*3/uL (ref 0.00–0.07)
Basophils Absolute: 0 10*3/uL (ref 0.0–0.1)
Basophils Relative: 0 %
Eosinophils Absolute: 0 10*3/uL (ref 0.0–0.5)
Eosinophils Relative: 1 %
HCT: 21.5 % — ABNORMAL LOW (ref 36.0–46.0)
Hemoglobin: 7.5 g/dL — ABNORMAL LOW (ref 12.0–15.0)
Immature Granulocytes: 1 %
Lymphocytes Relative: 45 %
Lymphs Abs: 0.9 10*3/uL (ref 0.7–4.0)
MCH: 33.5 pg (ref 26.0–34.0)
MCHC: 34.9 g/dL (ref 30.0–36.0)
MCV: 96 fL (ref 80.0–100.0)
Monocytes Absolute: 0.2 10*3/uL (ref 0.1–1.0)
Monocytes Relative: 10 %
Neutro Abs: 0.9 10*3/uL — ABNORMAL LOW (ref 1.7–7.7)
Neutrophils Relative %: 43 %
Platelet Count: 65 10*3/uL — ABNORMAL LOW (ref 150–400)
RBC: 2.24 MIL/uL — ABNORMAL LOW (ref 3.87–5.11)
RDW: 12.4 % (ref 11.5–15.5)
WBC Count: 2 10*3/uL — ABNORMAL LOW (ref 4.0–10.5)
nRBC: 0 % (ref 0.0–0.2)

## 2020-12-10 LAB — CMP (CANCER CENTER ONLY)
ALT: 14 U/L (ref 0–44)
AST: 15 U/L (ref 15–41)
Albumin: 3.6 g/dL (ref 3.5–5.0)
Alkaline Phosphatase: 97 U/L (ref 38–126)
Anion gap: 6 (ref 5–15)
BUN: 20 mg/dL (ref 8–23)
CO2: 23 mmol/L (ref 22–32)
Calcium: 8.7 mg/dL — ABNORMAL LOW (ref 8.9–10.3)
Chloride: 103 mmol/L (ref 98–111)
Creatinine: 0.97 mg/dL (ref 0.44–1.00)
GFR, Estimated: 59 mL/min — ABNORMAL LOW (ref >60)
Glucose, Bld: 134 mg/dL — ABNORMAL HIGH (ref 70–99)
Potassium: 4.2 mmol/L (ref 3.5–5.1)
Sodium: 132 mmol/L — ABNORMAL LOW (ref 135–145)
Total Bilirubin: 0.3 mg/dL (ref 0.3–1.2)
Total Protein: 6.3 g/dL — ABNORMAL LOW (ref 6.5–8.1)

## 2020-12-10 LAB — SAMPLE TO BLOOD BANK

## 2020-12-10 MED ORDER — SODIUM CHLORIDE 0.9% FLUSH
10.0000 mL | Freq: Once | INTRAVENOUS | Status: AC
  Administered 2020-12-10: 10 mL
  Filled 2020-12-10: qty 10

## 2020-12-10 NOTE — Progress Notes (Signed)
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, she has developed severe pancytopenia due to treatment I plan to modify her chemotherapy schedule In the future, she will only receive treatment on day 1 and 8, rest day 15 for cycle of every 21 days Depending on her future blood work, she may or may not require further dose adjustment I recommend minimum 3-4 cycles of treatment before repeat CT imaging  Pancytopenia, acquired ([place]) This is due to recent chemotherapy She is not symptomatic At present time, she does not need transfusion support We discussed the importance of neutropenic precaution due to severe pancytopenia   No orders of the defined types were placed in this encounter.   All questions were answered. The patient knows to call the clinic with any problems, questions or concerns. The total time spent in the appointment was 20 minutes encounter with patients including review of chart and various tests results, discussions about plan of care and coordination of care plan   [name], MD 12/10/2020 3:35 PM  INTERVAL HISTORY: Please see below for problem oriented charting. She returns for chemotherapy and follow-up She is feeling well She denies recent fever or chills Denies abdominal pain The patient denies any recent signs or symptoms of bleeding such as spontaneous epistaxis, hematuria or hematochezia.   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 Extensive omental nodularity highly worrisome for peritoneal carcinomatosis. Late recurrence of melanoma can present as peritoneal carcinomatosis. Ovarian cancer more commonly presents in this manner. Patient does have a predominately low density right adnexal lesion measuring up to 3.5 cm, although this lesion is not typical for ovarian cancer. 2. No evidence of bowel or ureteral obstruction. 3. No other definite signs of metastatic disease. There are small indeterminate low-density hepatic lesions.   02/08/2016 Tumor Marker   Patient's tumor was tested for the following markers: CA-125 Results of the tumor marker test revealed 656.2   02/15/2016 Procedure   CT-guided core biopsy performed of omental mass just deep to the abdominal wall.   02/15/2016 Pathology Results   Omentum, biopsy, left - PAPILLARY SEROUS NEOPLASM, SEE COMMENT. Microscopic Comment There are papillary collections of largely low grade appearing cells with numerous psammoma bodies. Given the limited material it is difficult to distinguish between invasive implants of a serous borderline tumor and serous carcinoma, especially given the low grade appearance.   02/18/2016 Pathology Results   1. Omentum, resection for tumor INVASIVE IMPLANT OF HIGH GRADE SEROUS CARCINOMA 2. Uterus +/- tubes/ovaries, neoplastic HIGH GRADE SEROUS CARCINOMA INVOLVING LEFT TUBAL FIMBRIA SEROUS CARCINOMA WITH PREDOMINANT PSAMMOMA BODIES IMPLANT AT UTERUS SEROSA, BILATERAL FALLOPIAN TUBAL SEROSA, AND ANTERIOR PERITONEAL REFLECTION CERVIX: HISTOLOGICAL UNREMARKABLE ENDOMETRIUM: INACTIVE ENDOMETRIUM MYOMETRIUM: LEIOMYOMA LEFT OVARY: CYSTADENOFIBROMA RIGHT OVARY AND FALLOPIAN TUBE: HISTOLOGICAL UNREMARKABLE Microscopic Comment 2. ONCOLOGY TABLE - FALLOPIAN TUBE 1. Specimen, including laterality: Omentum, uterus, bilateral ovaries and fallopian tubes 2. Procedure:  Hysterectomy, bilateral salpingo-oophorectomy and tumor debulking omentectomy 3.  Lymph node sampling performed: No 4. Tumor site: uterus serosa, bilateral fallopian tubal serosa, peritoneal and omentum 5. Tumor location in fallopian tube: Left fallopian tubal fimbria 6. Specimen integrity (intact/ruptured/disrupted): Intact 7. Tumor size (cm): multi focal invasive omentum implant greater than 2 cm, left fallopian tube tumor 0.8 cm. 8. Histologic type: Serous carcinoma 9. Grade: 3 10. Microscopic tumor extension: uterus serosa, bilateral fallopian tubal serosa, peritoneal and omentum 11. Margins: NA 12. Lymph-Vascular invasion: identified 13. Lymph nodes: # examined: 0; # positive: NA 14. TNM: pT3c, pNx 15. FIGO Stage (based on pathologic findings, needs clinical correlation: IIIC 16. Comment: High grade serous carcinoma multifocally and extensively involves left fallopian tubal fimbria, omentum, uterus serosa, bilateral fallopian tubal serosa and peritoneal. At the left fallopian tube there are small foci of serous tubal intraepithelial carcinoma identified, so we conclude the carcinoma is fallopian tube origin   03/09/2016 Tumor Marker   Patient's tumor was tested for the following markers: CA-125 Results of the tumor marker test revealed 154.4   03/15/2016 Procedure   Technically successful right IJ power-injectable port catheter placement. Ready for routine use.   03/18/2016 - 07/13/2016 Chemotherapy   The patient had 6 cycles of carboplatin and taxol   04/14/2016 Tumor Marker   Patient's tumor was tested for the following markers: CA-125 Results of the tumor marker test revealed 36.7   04/25/2016 Genetic Testing   Patient has genetic testing done for germline mutation Results revealed patient has no mutation   04/28/2016 Tumor Marker   Patient's tumor was tested for the following markers: CA-125 Results of the tumor marker test revealed 24.6   06/21/2016 Tumor Marker   Patient's  tumor was tested for the following markers: CA-125 Results of the tumor marker test revealed 16.6   08/01/2016 Tumor Marker   Patient's tumor was tested for the following markers: CA-125 Results of the tumor marker test revealed 17.2   08/05/2016 Imaging   Interval TAH-BSO. No evidence of residual pelvic mass or metastatic disease within the abdomen or pelvis. No other acute findings.    11/04/2016 Tumor Marker   Patient's tumor was tested for the following markers: CA-125 Results of the tumor marker test revealed 13.6   01/20/2017 Tumor Marker   Patient's tumor was tested for the following markers: CA-125 Results of the tumor marker test revealed 13.8   04/14/2017 Tumor Marker   Patient's tumor was tested for the following markers: CA-125 Results of the tumor marker test revealed 16.1   06/13/2017 Imaging   No acute findings.  No mass or hernia identified.  Colonic diverticulosis, without radiographic evidence of diverticulitis.  Aortic atherosclerosis.   07/03/2017 Tumor Marker   Patient's tumor was tested for the following markers: CA-125 Results of the tumor marker test revealed 18.4   09/19/2017 Tumor Marker   Patient's tumor was tested for the following markers: CA-125 Results of the tumor marker test revealed 18.5   01/23/2018 Tumor Marker   Patient's tumor was tested for the following markers: CA-125 Results of the tumor marker test revealed 35.4   01/30/2018 Imaging   1. No evidence of local fallopian tube carcinoma recurrence within the pelvis. Post hysterectomy. 2. No evidence of metastatic peritoneal disease or omental disease. No solid organ metastasis.    03/20/2018 Tumor Marker   Patient's tumor was tested for the following markers: CA-125 Results of the tumor marker test revealed 73   05/02/2018 Tumor Marker   Patient's tumor was tested for the following markers: CA-125 Results  of the tumor marker test revealed 127.1   05/08/2018 Imaging   CT abdomen and  pelvis 1. 7 cm left subdiaphragmatic collection of loculated fluid versus low-density soft tissue. Given the patient's history of rising CA 125, metastatic disease considered highly likely.  2. Areas of fluid or recurrent disease identified in the right lower quadrant adjacent to the cecum and along right lower quadrant small bowel loops.   05/14/2018 Tumor Marker   Patient's tumor was tested for the following markers: CA-125 Results of the tumor marker test revealed 166    Genetic Testing   Patient has genetic testing done for ER/PR and MMR. Results revealed patient has the following on 02/18/2016 surgical pathology: ER 20%, PR 0% MMR: normal    Genetic Testing   Patient has genetic testing done for MSI. Results revealed patient has the following: MSI stable   05/18/2018 - 12/31/2018 Chemotherapy   The patient had carboplatin and Doxil   07/16/2018 Tumor Marker   Patient's tumor was tested for the following markers: CA-125 Results of the tumor marker test revealed 28.7   08/10/2018 Imaging   CT imaging:  Decreased size of peritoneal soft tissue masses in left upper quadrant, consistent with decreased metastatic disease.  No new or progressive metastatic disease. No other acute findings.  Colonic diverticulosis, without radiographic evidence of diverticulitis.  Stable small paraumbilical ventral hernia containing transverse colon.   08/13/2018 Tumor Marker   Patient's tumor was tested for the following markers: CA-125 Results of the tumor marker test revealed 21.6   08/24/2018 Echocardiogram   LV EF: 60% -  65%   10/15/2018 Tumor Marker   Patient's tumor was tested for the following markers: CA-125 Results of the tumor marker test revealed 20.1   10/19/2018 Imaging   Ct scan of abdomen and pelvis Status post hysterectomy, bilateral salpingo-oophorectomy, and omentectomy.  Two peritoneal implants in the left upper abdomen are stable versus mildly decreased, as above. No  abdominopelvic ascites.  No evidence of new/progressive metastatic disease.     11/19/2018 Tumor Marker   Patient's tumor was tested for the following markers: CA-125 Results of the tumor marker test revealed 21.7   11/23/2018 Echocardiogram   1. The left ventricle has normal systolic function of 62-03%. The cavity size was normal. There is no increased left ventricular wall thickness. Echo evidence of impaired diastolic relaxation.  2. The right ventricle has normal systolic function. The cavity was normal. There is no increase in right ventricular wall thickness.  3. The mitral valve is normal in structure. There is mild mitral annular calcification present.  4. The tricuspid valve is normal in structure.  5. The aortic valve is tricuspid There is mild thickening of the aortic valve.  6. The pulmonic valve was normal in structure. Pulmonic valve regurgitation is mild by color flow Doppler.  7. Normal LV systolic function; mild diastolic dysfunction.   12/31/2018 Tumor Marker   Patient's tumor was tested for the following markers: CA-125 Results of the tumor marker test revealed 17.1   01/31/2019 Imaging   1. Left upper quadrant peritoneal implants described previously have decreased in the interval. No new or progressive findings in the abdomen/pelvis today. No free fluid. 2. Right paraumbilical ventral hernia contains a small knuckle of transverse colon without complicating features. 3.  Aortic Atherosclerois (ICD10-170.0)  Aortic Atherosclerosis (ICD10-I70.0).   01/31/2019 Tumor Marker   Patient's tumor was tested for the following markers: CA-125 Results of the tumor marker test revealed 18.9  02/11/2019 - 03/13/2019 Chemotherapy   The patient is taking Niraparib. She self discontinued after 1 month   02/25/2019 Tumor Marker   Patient's tumor was tested for the following markers: CA-125 Results of the tumor marker test revealed 18.6   05/13/2019 Tumor Marker   Patient's  tumor was tested for the following markers: CA-125 Results of the tumor marker test revealed 230   05/22/2019 Imaging   1. Increased size of 4.6 cm soft tissue mass in the gastrosplenic ligament, consistent with metastatic disease. 2. No other sites of metastatic disease identified within the abdomen or pelvis. 3. Colonic diverticulosis. No radiographic evidence of diverticulitis. 4. Stable small right paraumbilical hernia containing transverse colon.   Aortic Atherosclerosis (ICD10-I70.0).   05/31/2019 - 08/22/2019 Chemotherapy   The patient had bevacizumab and Taxol for chemotherapy treatment.     05/31/2019 Tumor Marker   Patient's tumor was tested for the following markers: CA-125 Results of the tumor marker test revealed 84   06/21/2019 Tumor Marker   Patient's tumor was tested for the following markers: CA-125 Results of the tumor marker test revealed 49.4.   07/19/2019 Tumor Marker   Patient's tumor was tested for the following markers: CA-125. Results of the tumor marker test revealed 44.5   08/22/2019 Imaging   1. Increase in size of 5.0 cm mass in the gastro splenic ligament consistent with metastatic disease. 2. No additional sites of disease identified within the abdomen or pelvis. 3. Unchanged paraumbilical hernia containing nonobstructed loop of large bowel. 4.  Aortic Atherosclerosis (ICD10-I70.0).     08/30/2019 Tumor Marker   Patient's tumor was tested for the following markers: CA-125 Results of the tumor marker test revealed 71.5   08/30/2019 - 05/11/2020 Chemotherapy   The patient had gemzar for chemotherapy treatment.     09/27/2019 Tumor Marker   Patient's tumor was tested for the following markers: CA-125. Results of the tumor marker test revealed 38.5   10/14/2019 Tumor Marker   Patient's tumor was tested for the following markers: CA-125 Results of the tumor marker test revealed 40.8   10/21/2019 Tumor Marker   Patient's tumor was tested for the  following markers: CA-125 Results of the tumor marker test revealed 34.8.   11/22/2019 Imaging   1. Interval decrease in size of left upper quadrant cystic and solid peritoneal lesion. 2. No new sites of disease. 3. Unchanged periumbilical hernia containing a nonobstructed loop of small bowel. 4.  Aortic Atherosclerosis (ICD10-I70.0).     11/25/2019 Tumor Marker   Patient's tumor was tested for the following markers: CA-125 Results of the tumor marker test revealed 22.7   12/23/2019 Tumor Marker   Patient's tumor was tested for the following markers: CA-125 Results of the tumor marker test revealed 26.3   01/20/2020 Tumor Marker   Patient's tumor was tested for the following markers: CA-125 Results of the tumor marker test revealed 28.3   02/14/2020 Imaging   1. 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 2. No new or progressive disease identified within the abdomen or pelvis. 3. Colonic diverticulosis. No radiographic evidence of diverticulitis. 4. Small paraumbilical ventral hernia.     02/17/2020 Tumor Marker   Patient's tumor was tested for the following markers: CA-125 Results of the tumor marker test revealed 40.1   05/11/2020 Tumor Marker   Patient's tumor was tested for the following markers: CA-125 Results of the tumor marker test revealed 113.   05/22/2020 Imaging   1.  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 2. No evidence of ascites, bowel or ureteral obstruction. 3. 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 No evidence of incarceration or obstruction. 4. Aortic Atherosclerosis (ICD10-I70.0).   05/22/2020 Tumor Marker   Patient's tumor was tested for the following markers: CA-125 Results of the tumor marker test revealed 118   05/29/2020 Echocardiogram   1. Normal GLS -19.8.  Left ventricular ejection fraction, by estimation, is 55 to 60%. The left ventricle has normal function. The left ventricle has no regional wall motion abnormalities. Left ventricular diastolic parameters were normal.  2. Right ventricular systolic function is normal. The right ventricular size is normal.  3. The mitral valve is degenerative. Trivial mitral valve regurgitation. No evidence of mitral stenosis.  4. The aortic valve is tricuspid. Aortic valve regurgitation is not visualized. Mild to moderate aortic valve sclerosis/calcification is present, without any evidence of aortic stenosis.  5. The inferior vena cava is normal in size with greater than 50% respiratory variability, suggesting right atrial pressure of 3 mmHg.   06/01/2020 - 10/29/2020 Chemotherapy   The patient had carboplatin and doxil for chemotherapy treatment for 6 cycles    06/01/2020 Tumor Marker   Patient's tumor was tested for the following markers: CA-125 Results of the tumor marker test revealed 126.   06/29/2020 Tumor Marker   Patient's tumor was tested for the following markers: CA-125. Results of the tumor marker test revealed 108   07/27/2020 Tumor Marker   Patient's tumor was tested for the following markers: CA-125. Results of the tumor marker test revealed 89.1   08/18/2020 Imaging   1. Interval positive response to therapy. 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 No new or progressive metastatic disease in the abdomen or pelvis. 2. Chronic findings include: Small hiatal hernia. Mild to moderate colonic diverticulosis. Aortic Atherosclerosis (ICD10-I70.0).   08/24/2020 Tumor Marker   Patient's tumor was tested for the following markers: CA-125 Results of the tumor marker test revealed 81.2   08/25/2020 Echocardiogram   1. Left ventricular ejection fraction, by estimation, is 60 to 65%. The left ventricle has normal function. The left ventricle has  no regional wall motion abnormalities. Left ventricular diastolic parameters were normal. The average left ventricular global longitudinal strain is -21.4 %. The global longitudinal strain is normal.  2. Right ventricular systolic function is normal. The right ventricular size is normal.  3. The mitral valve is normal in structure. No evidence of mitral valve regurgitation. No evidence of mitral stenosis.  4. The aortic valve is tricuspid. There is moderate calcification of the aortic valve. There is moderate thickening of the aortic valve. Aortic valve regurgitation is not visualized. Mild to moderate aortic valve sclerosis/calcification is present, without any evidence of aortic stenosis.  5. Aortic dilatation noted. There is mild dilatation of the aortic root, measuring 37 mm.  6. The inferior vena cava is normal in size with greater than 50% respiratory variability, suggesting right atrial pressure of 3 mmHg.   09/21/2020 Tumor Marker   Patient's tumor was tested for the following markers: CA-125 Results of the tumor marker test revealed 72.7.   10/01/2020 Tumor Marker   Patient's tumor was tested for the following markers: CA-125 Results of the tumor marker test revealed 80.9   10/29/2020 Tumor Marker   Patient's tumor was tested for the  following markers: CA-125 Results of the tumor marker test revealed 88   11/12/2020 Imaging   1. 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 2. Small right paramidline paraumbilical ventral hernia containing a short segment of mid transverse colon, without evidence of bowel incarceration or obstruction at this time. 3. Colonic diverticulosis without evidence of acute diverticulitis at this time. 4. Aortic atherosclerosis, in addition to left main coronary artery disease. Please note that although the presence of coronary artery calcium documents the presence of coronary artery disease, the severity of this disease and any  potential stenosis cannot be assessed on this non-gated CT examination. Assessment for potential risk factor modification, dietary therapy or pharmacologic therapy may be warranted, if clinically indicated. 5. Additional incidental findings, as above.   11/26/2020 -  Chemotherapy    Patient is on Treatment Plan: OVARIAN TOPOTECAN D1,8, Q21D      11/26/2020 Tumor Marker   Patient's tumor was tested for the following markers CA-125 Results of the tumor marker test revealed 89.1   12/03/2020 Tumor Marker   Patient's tumor was tested for the following markers: CA-125 Results of the tumor marker test revealed 84.3     REVIEW OF SYSTEMS:   Constitutional: Denies fevers, chills or abnormal weight loss Eyes: Denies blurriness of vision Ears, nose, mouth, throat, and face: Denies mucositis or sore throat Respiratory: Denies cough, dyspnea or wheezes Cardiovascular: Denies palpitation, chest discomfort or lower extremity swelling Gastrointestinal:  Denies nausea, heartburn or change in bowel habits Skin: Denies abnormal skin rashes Lymphatics: Denies new lymphadenopathy or easy bruising Neurological:Denies numbness, tingling or new weaknesses Behavioral/Psych: Mood is stable, no new changes  All other systems were reviewed with the patient and are negative.  I have reviewed the past medical history, past surgical history, social history and family history with the patient and they are unchanged from previous note.  ALLERGIES:  has No Known Allergies.  MEDICATIONS:  Current Outpatient Medications  Medication Sig Dispense Refill  . Cholecalciferol (VITAMIN D) 2000 units CAPS Take 1 capsule by mouth daily.    Marland Kitchen ezetimibe (ZETIA) 10 MG tablet Take 10 mg by mouth daily.    Marland Kitchen lidocaine-prilocaine (EMLA) cream Apply to affected area once 30 g 3  . ondansetron (ZOFRAN) 8 MG tablet Take 1 tablet (8 mg total) by mouth 2 (two) times daily as needed (Nausea or vomiting). 30 tablet 1  .  prochlorperazine (COMPAZINE) 10 MG tablet Take 1 tablet (10 mg total) by mouth every 6 (six) hours as needed (Nausea or vomiting). 60 tablet 1   No current facility-administered medications for this visit.    PHYSICAL EXAMINATION: ECOG PERFORMANCE STATUS: 1 - Symptomatic but completely ambulatory  Vitals:   12/10/20 1416  BP: (!) 140/58  Pulse: 83  Resp: 20  Temp: 98.3 F (36.8 C)  SpO2: 100%   Filed Weights   12/10/20 1416  Weight: 145 lb 9.6 oz (66 kg)    GENERAL:alert, no distress and comfortable NEURO: alert & oriented x 3 with fluent speech, no focal motor/sensory deficits  LABORATORY DATA:  I have reviewed the data as listed    Component Value Date/Time   NA 132 (L) 12/10/2020 1400   NA 139 06/07/2017 1430   K 4.2 12/10/2020 1400   K 3.8 06/07/2017 1430   CL 103 12/10/2020 1400   CO2 23 12/10/2020 1400   CO2 27 06/07/2017 1430   GLUCOSE 134 (H) 12/10/2020 1400   GLUCOSE 124 06/07/2017 1430  BUN 20 12/10/2020 1400   BUN 14.0 06/07/2017 1430   CREATININE 0.97 12/10/2020 1400   CREATININE 0.8 06/07/2017 1430   CALCIUM 8.7 (L) 12/10/2020 1400   CALCIUM 9.3 06/07/2017 1430   PROT 6.3 (L) 12/10/2020 1400   PROT 6.6 09/30/2016 1027   ALBUMIN 3.6 12/10/2020 1400   ALBUMIN 3.5 09/30/2016 1027   AST 15 12/10/2020 1400   AST 18 09/30/2016 1027   ALT 14 12/10/2020 1400   ALT 21 09/30/2016 1027   ALKPHOS 97 12/10/2020 1400   ALKPHOS 77 09/30/2016 1027   BILITOT 0.3 12/10/2020 1400   BILITOT 0.37 09/30/2016 1027   GFRNONAA 59 (L) 12/10/2020 1400   GFRAA >60 06/29/2020 0913   GFRAA >60 01/20/2020 1029    No results found for: SPEP, UPEP  Lab Results  Component Value Date   WBC 2.0 (L) 12/10/2020   NEUTROABS 0.9 (L) 12/10/2020   HGB 7.5 (L) 12/10/2020   HCT 21.5 (L) 12/10/2020   MCV 96.0 12/10/2020   PLT 65 (L) 12/10/2020      Chemistry      Component Value Date/Time   NA 132 (L) 12/10/2020 1400   NA 139 06/07/2017 1430   K 4.2 12/10/2020 1400   K  3.8 06/07/2017 1430   CL 103 12/10/2020 1400   CO2 23 12/10/2020 1400   CO2 27 06/07/2017 1430   BUN 20 12/10/2020 1400   BUN 14.0 06/07/2017 1430   CREATININE 0.97 12/10/2020 1400   CREATININE 0.8 06/07/2017 1430      Component Value Date/Time   CALCIUM 8.7 (L) 12/10/2020 1400   CALCIUM 9.3 06/07/2017 1430   ALKPHOS 97 12/10/2020 1400   ALKPHOS 77 09/30/2016 1027   AST 15 12/10/2020 1400   AST 18 09/30/2016 1027   ALT 14 12/10/2020 1400   ALT 21 09/30/2016 1027   BILITOT 0.3 12/10/2020 1400   BILITOT 0.37 09/30/2016 1027       RADIOGRAPHIC STUDIES: I have personally reviewed the radiological images as listed and agreed with the findings in the report. CT CHEST ABDOMEN PELVIS W CONTRAST  Result Date: 11/12/2020 CLINICAL DATA:  80 year old female with history of cancer of the left fallopian tube. Recurrence in 2019. Chemotherapy in progress. Follow-up study. EXAM: CT CHEST, ABDOMEN, AND PELVIS WITH CONTRAST TECHNIQUE: Multidetector CT imaging of the chest, abdomen and pelvis was performed following the standard protocol during bolus administration of intravenous contrast. CONTRAST:  16m OMNIPAQUE IOHEXOL 300 MG/ML  SOLN COMPARISON:  CT of the abdomen and pelvis 08/18/2020. No prior chest CT. FINDINGS: CT CHEST FINDINGS Cardiovascular: Heart size is normal. There is no significant pericardial fluid, thickening or pericardial calcification. There is aortic atherosclerosis, as well as atherosclerosis of the great vessels of the mediastinum and the coronary arteries, including calcified atherosclerotic plaque in the left main coronary artery. Right internal jugular single-lumen porta cath with tip terminating in the distal superior vena cava. Mediastinum/Nodes: No pathologically enlarged mediastinal or hilar lymph nodes. Esophagus is unremarkable in appearance. No axillary lymphadenopathy. Lungs/Pleura: No suspicious appearing pulmonary nodules or masses are noted. No acute consolidative  airspace disease. No pleural effusions. Mild linear scarring in the lower lobes of the lungs bilaterally, similar to the prior study. Musculoskeletal: There are no aggressive appearing lytic or blastic lesions noted in the visualized portions of the skeleton. CT ABDOMEN PELVIS FINDINGS Hepatobiliary: Previously noted hypovascular hepatic lesions appear increased in size. Specifically, lesion in segment 2 of the liver (axial image 51 of series 2) currently  measures 1.9 x 1.7 cm (previously 1.7 x 1.1 cm). Lesion in segment 4 a of the liver (axial image 51 of series 2) currently measures 3.1 x 2.2 cm (previously 2.1 x 1.8 cm). Capsular implant overlying segment 5 of the liver (axial image 62 of series 2) has increased to 2.4 x 1.0 cm (previously 1.6 x 0.4 cm). No new hepatic lesions are otherwise noted. No intra or extrahepatic biliary ductal dilatation. Gallbladder is normal in appearance. Pancreas: No pancreatic mass. No pancreatic ductal dilatation. No pancreatic or peripancreatic fluid collections or inflammatory changes. Spleen: Spleen is in contact with the metastatic implant in the left upper quadrant, but is otherwise unremarkable in appearance. Adrenals/Urinary Tract: Subcentimeter low-attenuation lesion in the upper pole of the right kidney, too small to characterize, but statistically likely to represent a tiny cyst. Left kidney and bilateral adrenal glands are unremarkable in appearance. No hydroureteronephrosis. Urinary bladder is nearly decompressed, but otherwise unremarkable in appearance. Stomach/Bowel: Normal appearance of the stomach. No pathologic dilatation of small bowel or colon. Short segment of transverse colon extends into a right paramidline ventral hernia. Numerous colonic diverticulae are noted, without surrounding inflammatory changes to suggest an acute diverticulitis at this time. Normal appendix. Vascular/Lymphatic: Aortic atherosclerosis, without evidence of aneurysm or dissection in  the abdominal or pelvic vasculature. No lymphadenopathy noted in the abdomen or pelvis. Reproductive: Status post total abdominal hysterectomy and bilateral salpingo-oophorectomy. Other: Large metastatic deposit in the left upper quadrant abutting the greater curvature of the stomach and anterior aspect of the spleen, increased in size compared to the prior study, currently measuring 5.3 x 3.5 x 4.0 cm (axial image 53 of series 2 and coronal image 89 of series 5), now making broad contact with the proximal greater curvature of the stomach. Smaller adjacent implants are also noted in the region of the gastrosplenic ligament (coronal image 93 of series 5), also increased slightly compared to the prior study. Small metastatic deposit in the low left pericolic gutter (axial image 101 of series 2) measuring 1.1 x 0.4 cm, minimally increased compared to the prior study. Right paramidline ventral hernia adjacent to the umbilicus containing a short segment of the mid transverse colon. Musculoskeletal: There are no aggressive appearing lytic or blastic lesions noted in the visualized portions of the skeleton. IMPRESSION: 1. 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 2. Small right paramidline paraumbilical ventral hernia containing a short segment of mid transverse colon, without evidence of bowel incarceration or obstruction at this time. 3. Colonic diverticulosis without evidence of acute diverticulitis at this time. 4. Aortic atherosclerosis, in addition to left main coronary artery disease. Please note that although the presence of coronary artery calcium documents the presence of coronary artery disease, the severity of this disease and any potential stenosis cannot be assessed on this non-gated CT examination. Assessment for potential risk factor modification, dietary therapy or pharmacologic therapy may be warranted, if clinically indicated. 5. Additional incidental findings, as  above. Electronically Signed   By: Vinnie Langton M.D.   On: 11/12/2020 10:49

## 2020-12-10 NOTE — Assessment & Plan Note (Signed)
Unfortunately, she has developed severe pancytopenia due to treatment I plan to modify her chemotherapy schedule In the future, she will only receive treatment on day 1 and 8, rest day 15 for cycle of every 21 days Depending on her future blood work, she may or may not require further dose adjustment I recommend minimum 3-4 cycles of treatment before repeat CT imaging

## 2020-12-10 NOTE — Assessment & Plan Note (Signed)
This is due to recent chemotherapy She is not symptomatic At present time, she does not need transfusion support We discussed the importance of neutropenic precaution due to severe pancytopenia

## 2020-12-24 ENCOUNTER — Encounter: Payer: Self-pay | Admitting: Hematology and Oncology

## 2020-12-24 ENCOUNTER — Inpatient Hospital Stay: Payer: Medicare Other

## 2020-12-24 ENCOUNTER — Inpatient Hospital Stay: Payer: Medicare Other | Attending: Gynecologic Oncology

## 2020-12-24 ENCOUNTER — Telehealth: Payer: Self-pay | Admitting: Hematology and Oncology

## 2020-12-24 ENCOUNTER — Inpatient Hospital Stay (HOSPITAL_BASED_OUTPATIENT_CLINIC_OR_DEPARTMENT_OTHER): Payer: Medicare Other | Admitting: Hematology and Oncology

## 2020-12-24 ENCOUNTER — Other Ambulatory Visit: Payer: Self-pay

## 2020-12-24 DIAGNOSIS — C5702 Malignant neoplasm of left fallopian tube: Secondary | ICD-10-CM | POA: Diagnosis present

## 2020-12-24 DIAGNOSIS — R030 Elevated blood-pressure reading, without diagnosis of hypertension: Secondary | ICD-10-CM

## 2020-12-24 DIAGNOSIS — D61818 Other pancytopenia: Secondary | ICD-10-CM

## 2020-12-24 DIAGNOSIS — C5700 Malignant neoplasm of unspecified fallopian tube: Secondary | ICD-10-CM

## 2020-12-24 DIAGNOSIS — Z5111 Encounter for antineoplastic chemotherapy: Secondary | ICD-10-CM | POA: Diagnosis present

## 2020-12-24 DIAGNOSIS — Z7189 Other specified counseling: Secondary | ICD-10-CM

## 2020-12-24 DIAGNOSIS — Z95828 Presence of other vascular implants and grafts: Secondary | ICD-10-CM

## 2020-12-24 DIAGNOSIS — D539 Nutritional anemia, unspecified: Secondary | ICD-10-CM | POA: Diagnosis not present

## 2020-12-24 LAB — CBC WITH DIFFERENTIAL (CANCER CENTER ONLY)
Abs Immature Granulocytes: 0.01 10*3/uL (ref 0.00–0.07)
Basophils Absolute: 0 10*3/uL (ref 0.0–0.1)
Basophils Relative: 1 %
Eosinophils Absolute: 0.1 10*3/uL (ref 0.0–0.5)
Eosinophils Relative: 1 %
HCT: 24.7 % — ABNORMAL LOW (ref 36.0–46.0)
Hemoglobin: 8.4 g/dL — ABNORMAL LOW (ref 12.0–15.0)
Immature Granulocytes: 0 %
Lymphocytes Relative: 28 %
Lymphs Abs: 1 10*3/uL (ref 0.7–4.0)
MCH: 33.1 pg (ref 26.0–34.0)
MCHC: 34 g/dL (ref 30.0–36.0)
MCV: 97.2 fL (ref 80.0–100.0)
Monocytes Absolute: 0.6 10*3/uL (ref 0.1–1.0)
Monocytes Relative: 17 %
Neutro Abs: 1.9 10*3/uL (ref 1.7–7.7)
Neutrophils Relative %: 53 %
Platelet Count: 336 10*3/uL (ref 150–400)
RBC: 2.54 MIL/uL — ABNORMAL LOW (ref 3.87–5.11)
RDW: 13.5 % (ref 11.5–15.5)
WBC Count: 3.6 10*3/uL — ABNORMAL LOW (ref 4.0–10.5)
nRBC: 0 % (ref 0.0–0.2)

## 2020-12-24 LAB — CMP (CANCER CENTER ONLY)
ALT: 16 U/L (ref 0–44)
AST: 17 U/L (ref 15–41)
Albumin: 3.6 g/dL (ref 3.5–5.0)
Alkaline Phosphatase: 105 U/L (ref 38–126)
Anion gap: 10 (ref 5–15)
BUN: 14 mg/dL (ref 8–23)
CO2: 23 mmol/L (ref 22–32)
Calcium: 8.9 mg/dL (ref 8.9–10.3)
Chloride: 104 mmol/L (ref 98–111)
Creatinine: 0.97 mg/dL (ref 0.44–1.00)
GFR, Estimated: 59 mL/min — ABNORMAL LOW (ref >60)
Glucose, Bld: 126 mg/dL — ABNORMAL HIGH (ref 70–99)
Potassium: 4.9 mmol/L (ref 3.5–5.1)
Sodium: 137 mmol/L (ref 135–145)
Total Bilirubin: 0.3 mg/dL (ref 0.3–1.2)
Total Protein: 6.5 g/dL (ref 6.5–8.1)

## 2020-12-24 LAB — SAMPLE TO BLOOD BANK

## 2020-12-24 MED ORDER — TOPOTECAN HCL CHEMO INJECTION 4 MG
2.4000 mg/m2 | Freq: Once | INTRAVENOUS | Status: AC
  Administered 2020-12-24: 4.2 mg via INTRAVENOUS
  Filled 2020-12-24: qty 4.2

## 2020-12-24 MED ORDER — SODIUM CHLORIDE 0.9% FLUSH
10.0000 mL | Freq: Once | INTRAVENOUS | Status: AC
  Administered 2020-12-24: 10 mL
  Filled 2020-12-24: qty 10

## 2020-12-24 MED ORDER — SODIUM CHLORIDE 0.9% FLUSH
10.0000 mL | INTRAVENOUS | Status: DC | PRN
  Administered 2020-12-24: 10 mL
  Filled 2020-12-24: qty 10

## 2020-12-24 MED ORDER — PROCHLORPERAZINE MALEATE 10 MG PO TABS
10.0000 mg | ORAL_TABLET | Freq: Once | ORAL | Status: AC
  Administered 2020-12-24: 10 mg via ORAL

## 2020-12-24 MED ORDER — SODIUM CHLORIDE 0.9 % IV SOLN
Freq: Once | INTRAVENOUS | Status: AC
  Filled 2020-12-24: qty 250

## 2020-12-24 MED ORDER — PROCHLORPERAZINE MALEATE 10 MG PO TABS
ORAL_TABLET | ORAL | Status: AC
  Filled 2020-12-24: qty 1

## 2020-12-24 MED ORDER — HEPARIN SOD (PORK) LOCK FLUSH 100 UNIT/ML IV SOLN
500.0000 [IU] | Freq: Once | INTRAVENOUS | Status: AC | PRN
  Administered 2020-12-24: 500 [IU]
  Filled 2020-12-24: qty 5

## 2020-12-24 NOTE — Progress Notes (Signed)
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 Her treatment break allow some bone marrow recovery The patient is not symptomatic I plan to reduce the dose of chemotherapy and her future treatment will be modified to days 1 and 8 rest day 15 for cycle of every 21 days I recommend minimum 3 cycles of treatment before repeat CT imaging  Pancytopenia, acquired ([place]) This is due to recent chemotherapy She is not symptomatic At present time, she does not need transfusion support  Elevated BP without diagnosis of hypertension She has persistent elevated blood pressure which she attributed to anxiety Observe closely for now   No orders of the defined types were placed in this encounter.   All questions were answered. The patient knows to call the clinic with any problems, questions or concerns. The total time spent in the appointment was 30 minutes encounter with patients including review of chart and various tests results, discussions about plan of care and coordination of care plan   [name], MD 12/24/2020 1:36 PM  INTERVAL HISTORY: Please see below for problem oriented charting. She returns for treatment and follow-up She denies recent chest pain, shortness of breath or dizziness The patient denies any recent signs or symptoms of bleeding such as spontaneous epistaxis, hematuria or hematochezia. 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 Extensive omental nodularity highly worrisome for peritoneal carcinomatosis. Late recurrence of melanoma can present as peritoneal carcinomatosis. Ovarian cancer more commonly presents in this manner. Patient does have a predominately low density right adnexal lesion measuring up to 3.5 cm, although this lesion is not typical for ovarian cancer. 2. No evidence of bowel or ureteral obstruction. 3. No other definite signs of metastatic disease. There are small indeterminate low-density hepatic lesions.   02/08/2016 Tumor Marker   Patient's tumor was tested for the following markers: CA-125 Results of the tumor marker test revealed 656.2   02/15/2016 Procedure   CT-guided core biopsy performed of omental mass just deep to the abdominal wall.   02/15/2016 Pathology Results   Omentum, biopsy, left - PAPILLARY SEROUS NEOPLASM, SEE COMMENT. Microscopic Comment There are papillary collections of largely low grade appearing cells with numerous psammoma bodies. Given the limited material it is difficult to distinguish between invasive implants of a serous borderline tumor and serous carcinoma, especially given the low grade appearance.   02/18/2016 Pathology Results   1. Omentum, resection for tumor INVASIVE IMPLANT OF HIGH GRADE SEROUS CARCINOMA 2. Uterus +/- tubes/ovaries, neoplastic HIGH GRADE SEROUS CARCINOMA INVOLVING LEFT TUBAL FIMBRIA SEROUS CARCINOMA WITH PREDOMINANT PSAMMOMA BODIES IMPLANT AT UTERUS SEROSA, BILATERAL FALLOPIAN TUBAL SEROSA, AND ANTERIOR PERITONEAL REFLECTION CERVIX: HISTOLOGICAL UNREMARKABLE ENDOMETRIUM: INACTIVE ENDOMETRIUM MYOMETRIUM: LEIOMYOMA LEFT OVARY: CYSTADENOFIBROMA RIGHT OVARY AND FALLOPIAN TUBE: HISTOLOGICAL UNREMARKABLE Microscopic Comment 2. ONCOLOGY TABLE - FALLOPIAN TUBE 1. Specimen, including laterality: Omentum, uterus,  bilateral ovaries and fallopian  tubes 2. Procedure: Hysterectomy, bilateral salpingo-oophorectomy and tumor debulking omentectomy 3. Lymph node sampling performed: No 4. Tumor site: uterus serosa, bilateral fallopian tubal serosa, peritoneal and omentum 5. Tumor location in fallopian tube: Left fallopian tubal fimbria 6. Specimen integrity (intact/ruptured/disrupted): Intact 7. Tumor size (cm): multi focal invasive omentum implant greater than 2 cm, left fallopian tube tumor 0.8 cm. 8. Histologic type: Serous carcinoma 9. Grade: 3 10. Microscopic tumor extension: uterus serosa, bilateral fallopian tubal serosa, peritoneal and omentum 11. Margins: NA 12. Lymph-Vascular invasion: identified 13. Lymph nodes: # examined: 0; # positive: NA 14. TNM: pT3c, pNx 15. FIGO Stage (based on pathologic findings, needs clinical correlation: IIIC 16. Comment: High grade serous carcinoma multifocally and extensively involves left fallopian tubal fimbria, omentum, uterus serosa, bilateral fallopian tubal serosa and peritoneal. At the left fallopian tube there are small foci of serous tubal intraepithelial carcinoma identified, so we conclude the carcinoma is fallopian tube origin   03/09/2016 Tumor Marker   Patient's tumor was tested for the following markers: CA-125 Results of the tumor marker test revealed 154.4   03/15/2016 Procedure   Technically successful right IJ power-injectable port catheter placement. Ready for routine use.   03/18/2016 - 07/13/2016 Chemotherapy   The patient had 6 cycles of carboplatin and taxol   04/14/2016 Tumor Marker   Patient's tumor was tested for the following markers: CA-125 Results of the tumor marker test revealed 36.7   04/25/2016 Genetic Testing   Patient has genetic testing done for germline mutation Results revealed patient has no mutation   04/28/2016 Tumor Marker   Patient's tumor was tested for the following markers: CA-125 Results of the tumor marker test  revealed 24.6   06/21/2016 Tumor Marker   Patient's tumor was tested for the following markers: CA-125 Results of the tumor marker test revealed 16.6   08/01/2016 Tumor Marker   Patient's tumor was tested for the following markers: CA-125 Results of the tumor marker test revealed 17.2   08/05/2016 Imaging   Interval TAH-BSO. No evidence of residual pelvic mass or metastatic disease within the abdomen or pelvis. No other acute findings.    11/04/2016 Tumor Marker   Patient's tumor was tested for the following markers: CA-125 Results of the tumor marker test revealed 13.6   01/20/2017 Tumor Marker   Patient's tumor was tested for the following markers: CA-125 Results of the tumor marker test revealed 13.8   04/14/2017 Tumor Marker   Patient's tumor was tested for the following markers: CA-125 Results of the tumor marker test revealed 16.1   06/13/2017 Imaging   No acute findings.  No mass or hernia identified.  Colonic diverticulosis, without radiographic evidence of diverticulitis.  Aortic atherosclerosis.   07/03/2017 Tumor Marker   Patient's tumor was tested for the following markers: CA-125 Results of the tumor marker test revealed 18.4   09/19/2017 Tumor Marker   Patient's tumor was tested for the following markers: CA-125 Results of the tumor marker test revealed 18.5   01/23/2018 Tumor Marker   Patient's tumor was tested for the following markers: CA-125 Results of the tumor marker test revealed 35.4   01/30/2018 Imaging   1. No evidence of local fallopian tube carcinoma recurrence within the pelvis. Post hysterectomy. 2. No evidence of metastatic peritoneal disease or omental disease. No solid organ metastasis.    03/20/2018 Tumor Marker   Patient's tumor was tested for the following markers: CA-125 Results of the tumor marker test revealed 73   05/02/2018 Tumor Marker  Patient's tumor was tested for the following markers: CA-125 Results of the tumor marker test  revealed 127.1   05/08/2018 Imaging   CT abdomen and pelvis 1. 7 cm left subdiaphragmatic collection of loculated fluid versus low-density soft tissue. Given the patient's history of rising CA 125, metastatic disease considered highly likely.  2. Areas of fluid or recurrent disease identified in the right lower quadrant adjacent to the cecum and along right lower quadrant small bowel loops.   05/14/2018 Tumor Marker   Patient's tumor was tested for the following markers: CA-125 Results of the tumor marker test revealed 166    Genetic Testing   Patient has genetic testing done for ER/PR and MMR. Results revealed patient has the following on 02/18/2016 surgical pathology: ER 20%, PR 0% MMR: normal    Genetic Testing   Patient has genetic testing done for MSI. Results revealed patient has the following: MSI stable   05/18/2018 - 12/31/2018 Chemotherapy   The patient had carboplatin and Doxil   07/16/2018 Tumor Marker   Patient's tumor was tested for the following markers: CA-125 Results of the tumor marker test revealed 28.7   08/10/2018 Imaging   CT imaging:  Decreased size of peritoneal soft tissue masses in left upper quadrant, consistent with decreased metastatic disease.  No new or progressive metastatic disease. No other acute findings.  Colonic diverticulosis, without radiographic evidence of diverticulitis.  Stable small paraumbilical ventral hernia containing transverse colon.   08/13/2018 Tumor Marker   Patient's tumor was tested for the following markers: CA-125 Results of the tumor marker test revealed 21.6   08/24/2018 Echocardiogram   LV EF: 60% -  65%   10/15/2018 Tumor Marker   Patient's tumor was tested for the following markers: CA-125 Results of the tumor marker test revealed 20.1   10/19/2018 Imaging   Ct scan of abdomen and pelvis Status post hysterectomy, bilateral salpingo-oophorectomy, and omentectomy.  Two peritoneal implants in the left upper  abdomen are stable versus mildly decreased, as above. No abdominopelvic ascites.  No evidence of new/progressive metastatic disease.     11/19/2018 Tumor Marker   Patient's tumor was tested for the following markers: CA-125 Results of the tumor marker test revealed 21.7   11/23/2018 Echocardiogram   1. The left ventricle has normal systolic function of 16-10%. The cavity size was normal. There is no increased left ventricular wall thickness. Echo evidence of impaired diastolic relaxation.  2. The right ventricle has normal systolic function. The cavity was normal. There is no increase in right ventricular wall thickness.  3. The mitral valve is normal in structure. There is mild mitral annular calcification present.  4. The tricuspid valve is normal in structure.  5. The aortic valve is tricuspid There is mild thickening of the aortic valve.  6. The pulmonic valve was normal in structure. Pulmonic valve regurgitation is mild by color flow Doppler.  7. Normal LV systolic function; mild diastolic dysfunction.   12/31/2018 Tumor Marker   Patient's tumor was tested for the following markers: CA-125 Results of the tumor marker test revealed 17.1   01/31/2019 Imaging   1. Left upper quadrant peritoneal implants described previously have decreased in the interval. No new or progressive findings in the abdomen/pelvis today. No free fluid. 2. Right paraumbilical ventral hernia contains a small knuckle of transverse colon without complicating features. 3.  Aortic Atherosclerois (ICD10-170.0)  Aortic Atherosclerosis (ICD10-I70.0).   01/31/2019 Tumor Marker   Patient's tumor was tested for the following markers: CA-125  Results of the tumor marker test revealed 18.9   02/11/2019 - 03/13/2019 Chemotherapy   The patient is taking Niraparib. She self discontinued after 1 month   02/25/2019 Tumor Marker   Patient's tumor was tested for the following markers: CA-125 Results of the tumor marker test  revealed 18.6   05/13/2019 Tumor Marker   Patient's tumor was tested for the following markers: CA-125 Results of the tumor marker test revealed 230   05/22/2019 Imaging   1. Increased size of 4.6 cm soft tissue mass in the gastrosplenic ligament, consistent with metastatic disease. 2. No other sites of metastatic disease identified within the abdomen or pelvis. 3. Colonic diverticulosis. No radiographic evidence of diverticulitis. 4. Stable small right paraumbilical hernia containing transverse colon.   Aortic Atherosclerosis (ICD10-I70.0).   05/31/2019 - 08/22/2019 Chemotherapy   The patient had bevacizumab and Taxol for chemotherapy treatment.     05/31/2019 Tumor Marker   Patient's tumor was tested for the following markers: CA-125 Results of the tumor marker test revealed 84   06/21/2019 Tumor Marker   Patient's tumor was tested for the following markers: CA-125 Results of the tumor marker test revealed 49.4.   07/19/2019 Tumor Marker   Patient's tumor was tested for the following markers: CA-125. Results of the tumor marker test revealed 44.5   08/22/2019 Imaging   1. Increase in size of 5.0 cm mass in the gastro splenic ligament consistent with metastatic disease. 2. No additional sites of disease identified within the abdomen or pelvis. 3. Unchanged paraumbilical hernia containing nonobstructed loop of large bowel. 4.  Aortic Atherosclerosis (ICD10-I70.0).     08/30/2019 Tumor Marker   Patient's tumor was tested for the following markers: CA-125 Results of the tumor marker test revealed 71.5   08/30/2019 - 05/11/2020 Chemotherapy   The patient had gemzar for chemotherapy treatment.     09/27/2019 Tumor Marker   Patient's tumor was tested for the following markers: CA-125. Results of the tumor marker test revealed 38.5   10/14/2019 Tumor Marker   Patient's tumor was tested for the following markers: CA-125 Results of the tumor marker test revealed 40.8   10/21/2019  Tumor Marker   Patient's tumor was tested for the following markers: CA-125 Results of the tumor marker test revealed 34.8.   11/22/2019 Imaging   1. Interval decrease in size of left upper quadrant cystic and solid peritoneal lesion. 2. No new sites of disease. 3. Unchanged periumbilical hernia containing a nonobstructed loop of small bowel. 4.  Aortic Atherosclerosis (ICD10-I70.0).     11/25/2019 Tumor Marker   Patient's tumor was tested for the following markers: CA-125 Results of the tumor marker test revealed 22.7   12/23/2019 Tumor Marker   Patient's tumor was tested for the following markers: CA-125 Results of the tumor marker test revealed 26.3   01/20/2020 Tumor Marker   Patient's tumor was tested for the following markers: CA-125 Results of the tumor marker test revealed 28.3   02/14/2020 Imaging   1. 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 2. No new or progressive disease identified within the abdomen or pelvis. 3. Colonic diverticulosis. No radiographic evidence of diverticulitis. 4. Small paraumbilical ventral hernia.     02/17/2020 Tumor Marker   Patient's tumor was tested for the following markers: CA-125 Results of the tumor marker test revealed 40.1   05/11/2020 Tumor Marker   Patient's tumor was tested for the following markers: CA-125 Results of the tumor marker  test revealed 113.   05/22/2020 Imaging   1. 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 2. No evidence of ascites, bowel or ureteral obstruction. 3. 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 No evidence of incarceration or obstruction. 4. Aortic Atherosclerosis (ICD10-I70.0).   05/22/2020 Tumor Marker   Patient's tumor was tested for the following markers: CA-125 Results of the tumor marker test revealed 118    05/29/2020 Echocardiogram   1. Normal GLS -19.8. Left ventricular ejection fraction, by estimation, is 55 to 60%. The left ventricle has normal function. The left ventricle has no regional wall motion abnormalities. Left ventricular diastolic parameters were normal.  2. Right ventricular systolic function is normal. The right ventricular size is normal.  3. The mitral valve is degenerative. Trivial mitral valve regurgitation. No evidence of mitral stenosis.  4. The aortic valve is tricuspid. Aortic valve regurgitation is not visualized. Mild to moderate aortic valve sclerosis/calcification is present, without any evidence of aortic stenosis.  5. The inferior vena cava is normal in size with greater than 50% respiratory variability, suggesting right atrial pressure of 3 mmHg.   06/01/2020 - 10/29/2020 Chemotherapy   The patient had carboplatin and doxil for chemotherapy treatment for 6 cycles    06/01/2020 Tumor Marker   Patient's tumor was tested for the following markers: CA-125 Results of the tumor marker test revealed 126.   06/29/2020 Tumor Marker   Patient's tumor was tested for the following markers: CA-125. Results of the tumor marker test revealed 108   07/27/2020 Tumor Marker   Patient's tumor was tested for the following markers: CA-125. Results of the tumor marker test revealed 89.1   08/18/2020 Imaging   1. Interval positive response to therapy. 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 No new or progressive metastatic disease in the abdomen or pelvis. 2. Chronic findings include: Small hiatal hernia. Mild to moderate colonic diverticulosis. Aortic Atherosclerosis (ICD10-I70.0).   08/24/2020 Tumor Marker   Patient's tumor was tested for the following markers: CA-125 Results of the tumor marker test revealed 81.2   08/25/2020 Echocardiogram   1. Left ventricular ejection fraction, by estimation, is 60 to 65%. The left  ventricle has normal function. The left ventricle has no regional wall motion abnormalities. Left ventricular diastolic parameters were normal. The average left ventricular global longitudinal strain is -21.4 %. The global longitudinal strain is normal.  2. Right ventricular systolic function is normal. The right ventricular size is normal.  3. The mitral valve is normal in structure. No evidence of mitral valve regurgitation. No evidence of mitral stenosis.  4. The aortic valve is tricuspid. There is moderate calcification of the aortic valve. There is moderate thickening of the aortic valve. Aortic valve regurgitation is not visualized. Mild to moderate aortic valve sclerosis/calcification is present, without any evidence of aortic stenosis.  5. Aortic dilatation noted. There is mild dilatation of the aortic root, measuring 37 mm.  6. The inferior vena cava is normal in size with greater than 50% respiratory variability, suggesting right atrial pressure of 3 mmHg.   09/21/2020 Tumor Marker   Patient's tumor was tested for the following markers: CA-125 Results of the tumor marker test revealed 72.7.   10/01/2020 Tumor Marker   Patient's tumor was tested for the following markers: CA-125 Results of the tumor marker test revealed 80.9   10/29/2020  Tumor Marker   Patient's tumor was tested for the following markers: CA-125 Results of the tumor marker test revealed 88   11/12/2020 Imaging   1. 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 2. Small right paramidline paraumbilical ventral hernia containing a short segment of mid transverse colon, without evidence of bowel incarceration or obstruction at this time. 3. Colonic diverticulosis without evidence of acute diverticulitis at this time. 4. Aortic atherosclerosis, in addition to left main coronary artery disease. Please note that although the presence of coronary artery calcium documents the presence of coronary  artery disease, the severity of this disease and any potential stenosis cannot be assessed on this non-gated CT examination. Assessment for potential risk factor modification, dietary therapy or pharmacologic therapy may be warranted, if clinically indicated. 5. Additional incidental findings, as above.   11/26/2020 -  Chemotherapy    Patient is on Treatment Plan: OVARIAN TOPOTECAN D1,8, Q21D      11/26/2020 Tumor Marker   Patient's tumor was tested for the following markers CA-125 Results of the tumor marker test revealed 89.1   12/03/2020 Tumor Marker   Patient's tumor was tested for the following markers: CA-125 Results of the tumor marker test revealed 84.3     REVIEW OF SYSTEMS:   Constitutional: Denies fevers, chills or abnormal weight loss Eyes: Denies blurriness of vision Ears, nose, mouth, throat, and face: Denies mucositis or sore throat Respiratory: Denies cough, dyspnea or wheezes Cardiovascular: Denies palpitation, chest discomfort or lower extremity swelling Gastrointestinal:  Denies nausea, heartburn or change in bowel habits Skin: Denies abnormal skin rashes Lymphatics: Denies new lymphadenopathy or easy bruising Neurological:Denies numbness, tingling or new weaknesses Behavioral/Psych: Mood is stable, no new changes  All other systems were reviewed with the patient and are negative.  I have reviewed the past medical history, past surgical history, social history and family history with the patient and they are unchanged from previous note.  ALLERGIES:  has No Known Allergies.  MEDICATIONS:  Current Outpatient Medications  Medication Sig Dispense Refill  . Cholecalciferol (VITAMIN D) 2000 units CAPS Take 1 capsule by mouth daily.    Marland Kitchen ezetimibe (ZETIA) 10 MG tablet Take 10 mg by mouth daily.    Marland Kitchen lidocaine-prilocaine (EMLA) cream Apply to affected area once 30 g 3  . ondansetron (ZOFRAN) 8 MG tablet Take 1 tablet (8 mg total) by mouth 2 (two) times daily as  needed (Nausea or vomiting). 30 tablet 1  . prochlorperazine (COMPAZINE) 10 MG tablet Take 1 tablet (10 mg total) by mouth every 6 (six) hours as needed (Nausea or vomiting). 60 tablet 1   No current facility-administered medications for this visit.   Facility-Administered Medications Ordered in Other Visits  Medication Dose Route Frequency Provider Last Rate Last Admin  . 0.9 %  sodium chloride infusion   Intravenous Once [name], MD        PHYSICAL EXAMINATION: ECOG PERFORMANCE STATUS: 1 - Symptomatic but completely ambulatory  Vitals:   12/24/20 1312  BP: (!) 158/66  Pulse: 88  Resp: 18  Temp: 97.7 F (36.5 C)  SpO2: 100%   Filed Weights   12/24/20 1312  Weight: 148 lb 12.8 oz (67.5 kg)    GENERAL:alert, no distress and comfortable.  She looks pale SKIN: skin color, texture, turgor are normal, no rashes or significant lesions EYES: normal, Conjunctiva are pink and non-injected, sclera clear OROPHARYNX:no exudate, no erythema and lips, buccal mucosa, and tongue normal  NECK: supple, thyroid  normal size, non-tender, without nodularity LYMPH:  no palpable lymphadenopathy in the cervical, axillary or inguinal LUNGS: clear to auscultation and percussion with normal breathing effort HEART: regular rate & rhythm and no murmurs and no lower extremity edema ABDOMEN:abdomen soft, non-tender and normal bowel sounds Musculoskeletal:no cyanosis of digits and no clubbing  NEURO: alert & oriented x 3 with fluent speech, no focal motor/sensory deficits  LABORATORY DATA:  I have reviewed the data as listed    Component Value Date/Time   NA 137 12/24/2020 1248   NA 139 06/07/2017 1430   K 4.9 12/24/2020 1248   K 3.8 06/07/2017 1430   CL 104 12/24/2020 1248   CO2 23 12/24/2020 1248   CO2 27 06/07/2017 1430   GLUCOSE 126 (H) 12/24/2020 1248   GLUCOSE 124 06/07/2017 1430   BUN 14 12/24/2020 1248   BUN 14.0 06/07/2017 1430   CREATININE 0.97 12/24/2020 1248   CREATININE 0.8  06/07/2017 1430   CALCIUM 8.9 12/24/2020 1248   CALCIUM 9.3 06/07/2017 1430   PROT 6.5 12/24/2020 1248   PROT 6.6 09/30/2016 1027   ALBUMIN 3.6 12/24/2020 1248   ALBUMIN 3.5 09/30/2016 1027   AST 17 12/24/2020 1248   AST 18 09/30/2016 1027   ALT 16 12/24/2020 1248   ALT 21 09/30/2016 1027   ALKPHOS 105 12/24/2020 1248   ALKPHOS 77 09/30/2016 1027   BILITOT 0.3 12/24/2020 1248   BILITOT 0.37 09/30/2016 1027   GFRNONAA 59 (L) 12/24/2020 1248   GFRAA >60 06/29/2020 0913   GFRAA >60 01/20/2020 1029    No results found for: SPEP, UPEP  Lab Results  Component Value Date   WBC 3.6 (L) 12/24/2020   NEUTROABS 1.9 12/24/2020   HGB 8.4 (L) 12/24/2020   HCT 24.7 (L) 12/24/2020   MCV 97.2 12/24/2020   PLT 336 12/24/2020      Chemistry      Component Value Date/Time   NA 137 12/24/2020 1248   NA 139 06/07/2017 1430   K 4.9 12/24/2020 1248   K 3.8 06/07/2017 1430   CL 104 12/24/2020 1248   CO2 23 12/24/2020 1248   CO2 27 06/07/2017 1430   BUN 14 12/24/2020 1248   BUN 14.0 06/07/2017 1430   CREATININE 0.97 12/24/2020 1248   CREATININE 0.8 06/07/2017 1430      Component Value Date/Time   CALCIUM 8.9 12/24/2020 1248   CALCIUM 9.3 06/07/2017 1430   ALKPHOS 105 12/24/2020 1248   ALKPHOS 77 09/30/2016 1027   AST 17 12/24/2020 1248   AST 18 09/30/2016 1027   ALT 16 12/24/2020 1248   ALT 21 09/30/2016 1027   BILITOT 0.3 12/24/2020 1248   BILITOT 0.37 09/30/2016 1027

## 2020-12-24 NOTE — Assessment & Plan Note (Signed)
She has persistent elevated blood pressure which she attributed to anxiety Observe closely for now

## 2020-12-24 NOTE — Assessment & Plan Note (Signed)
This is due to recent chemotherapy She is not symptomatic At present time, she does not need transfusion support

## 2020-12-24 NOTE — Telephone Encounter (Signed)
Scheduled appts per 3/10 sch msg. Pt declined print out of AVS.

## 2020-12-24 NOTE — Assessment & Plan Note (Signed)
She continues to have severe pancytopenia Her treatment break allow some bone marrow recovery The patient is not symptomatic I plan to reduce the dose of chemotherapy and her future treatment will be modified to days 1 and 8 rest day 15 for cycle of every 21 days I recommend minimum 3 cycles of treatment before repeat CT imaging

## 2020-12-24 NOTE — Patient Instructions (Signed)
[facility] Discharge Instructions for Patients Receiving Chemotherapy  Today you received the following chemotherapy agents Topotecan(Hycamtin)  To help prevent nausea and vomiting after your treatment, we encourage you to take your nausea medication as directed.   If you develop nausea and vomiting that is not controlled by your nausea medication, call the clinic.   BELOW ARE SYMPTOMS THAT SHOULD BE REPORTED IMMEDIATELY:  *FEVER GREATER THAN 100.5 F  *CHILLS WITH OR WITHOUT FEVER  NAUSEA AND VOMITING THAT IS NOT CONTROLLED WITH YOUR NAUSEA MEDICATION  *UNUSUAL SHORTNESS OF BREATH  *UNUSUAL BRUISING OR BLEEDING  TENDERNESS IN MOUTH AND THROAT WITH OR WITHOUT PRESENCE OF ULCERS  *URINARY PROBLEMS  *BOWEL PROBLEMS  UNUSUAL RASH Items with * indicate a potential emergency and should be followed up as soon as possible.  Feel free to call the clinic should you have any questions or concerns. The clinic phone number is [phone].  Please show the [place] at check-in to the Emergency Department and triage nurse.

## 2020-12-31 ENCOUNTER — Inpatient Hospital Stay: Payer: Medicare Other

## 2020-12-31 ENCOUNTER — Other Ambulatory Visit: Payer: Self-pay | Admitting: Hematology and Oncology

## 2020-12-31 ENCOUNTER — Other Ambulatory Visit: Payer: Self-pay

## 2020-12-31 ENCOUNTER — Inpatient Hospital Stay (HOSPITAL_BASED_OUTPATIENT_CLINIC_OR_DEPARTMENT_OTHER): Payer: Medicare Other | Admitting: Hematology and Oncology

## 2020-12-31 DIAGNOSIS — Z95828 Presence of other vascular implants and grafts: Secondary | ICD-10-CM

## 2020-12-31 DIAGNOSIS — D539 Nutritional anemia, unspecified: Secondary | ICD-10-CM

## 2020-12-31 DIAGNOSIS — C5702 Malignant neoplasm of left fallopian tube: Secondary | ICD-10-CM

## 2020-12-31 DIAGNOSIS — D649 Anemia, unspecified: Secondary | ICD-10-CM

## 2020-12-31 DIAGNOSIS — Z5111 Encounter for antineoplastic chemotherapy: Secondary | ICD-10-CM | POA: Diagnosis not present

## 2020-12-31 DIAGNOSIS — C5700 Malignant neoplasm of unspecified fallopian tube: Secondary | ICD-10-CM | POA: Diagnosis not present

## 2020-12-31 DIAGNOSIS — D61818 Other pancytopenia: Secondary | ICD-10-CM

## 2020-12-31 DIAGNOSIS — Z7189 Other specified counseling: Secondary | ICD-10-CM

## 2020-12-31 LAB — CMP (CANCER CENTER ONLY)
ALT: 16 U/L (ref 0–44)
AST: 16 U/L (ref 15–41)
Albumin: 3.7 g/dL (ref 3.5–5.0)
Alkaline Phosphatase: 108 U/L (ref 38–126)
Anion gap: 9 (ref 5–15)
BUN: 21 mg/dL (ref 8–23)
CO2: 23 mmol/L (ref 22–32)
Calcium: 8.8 mg/dL — ABNORMAL LOW (ref 8.9–10.3)
Chloride: 100 mmol/L (ref 98–111)
Creatinine: 1 mg/dL (ref 0.44–1.00)
GFR, Estimated: 57 mL/min — ABNORMAL LOW (ref >60)
Glucose, Bld: 115 mg/dL — ABNORMAL HIGH (ref 70–99)
Potassium: 4.9 mmol/L (ref 3.5–5.1)
Sodium: 132 mmol/L — ABNORMAL LOW (ref 135–145)
Total Bilirubin: 0.2 mg/dL — ABNORMAL LOW (ref 0.3–1.2)
Total Protein: 6.6 g/dL (ref 6.5–8.1)

## 2020-12-31 LAB — CBC WITH DIFFERENTIAL (CANCER CENTER ONLY)
Abs Immature Granulocytes: 0.04 10*3/uL (ref 0.00–0.07)
Basophils Absolute: 0 10*3/uL (ref 0.0–0.1)
Basophils Relative: 1 %
Eosinophils Absolute: 0 10*3/uL (ref 0.0–0.5)
Eosinophils Relative: 1 %
HCT: 23.1 % — ABNORMAL LOW (ref 36.0–46.0)
Hemoglobin: 7.8 g/dL — ABNORMAL LOW (ref 12.0–15.0)
Immature Granulocytes: 1 %
Lymphocytes Relative: 28 %
Lymphs Abs: 1.1 10*3/uL (ref 0.7–4.0)
MCH: 32.6 pg (ref 26.0–34.0)
MCHC: 33.8 g/dL (ref 30.0–36.0)
MCV: 96.7 fL (ref 80.0–100.0)
Monocytes Absolute: 0.5 10*3/uL (ref 0.1–1.0)
Monocytes Relative: 11 %
Neutro Abs: 2.4 10*3/uL (ref 1.7–7.7)
Neutrophils Relative %: 58 %
Platelet Count: 213 10*3/uL (ref 150–400)
RBC: 2.39 MIL/uL — ABNORMAL LOW (ref 3.87–5.11)
RDW: 12.9 % (ref 11.5–15.5)
WBC Count: 4.1 10*3/uL (ref 4.0–10.5)
nRBC: 0 % (ref 0.0–0.2)

## 2020-12-31 LAB — SAMPLE TO BLOOD BANK

## 2020-12-31 LAB — VITAMIN B12: Vitamin B-12: 86 pg/mL — ABNORMAL LOW (ref 180–914)

## 2020-12-31 LAB — PREPARE RBC (CROSSMATCH)

## 2020-12-31 MED ORDER — HEPARIN SOD (PORK) LOCK FLUSH 100 UNIT/ML IV SOLN
250.0000 [IU] | INTRAVENOUS | Status: AC | PRN
  Administered 2020-12-31: 250 [IU]
  Filled 2020-12-31: qty 5

## 2020-12-31 MED ORDER — SODIUM CHLORIDE 0.9% FLUSH
10.0000 mL | Freq: Once | INTRAVENOUS | Status: AC
  Administered 2020-12-31: 10 mL
  Filled 2020-12-31: qty 10

## 2020-12-31 MED ORDER — SODIUM CHLORIDE 0.9% FLUSH
3.0000 mL | INTRAVENOUS | Status: DC | PRN
  Filled 2020-12-31: qty 10

## 2020-12-31 MED ORDER — ACETAMINOPHEN 325 MG PO TABS
ORAL_TABLET | ORAL | Status: AC
  Filled 2020-12-31: qty 2

## 2020-12-31 MED ORDER — DIPHENHYDRAMINE HCL 25 MG PO CAPS
25.0000 mg | ORAL_CAPSULE | Freq: Once | ORAL | Status: AC
  Administered 2020-12-31: 25 mg via ORAL

## 2020-12-31 MED ORDER — SODIUM CHLORIDE 0.9% FLUSH
10.0000 mL | INTRAVENOUS | Status: AC | PRN
  Administered 2020-12-31: 10 mL
  Filled 2020-12-31: qty 10

## 2020-12-31 MED ORDER — ACETAMINOPHEN 325 MG PO TABS
650.0000 mg | ORAL_TABLET | Freq: Once | ORAL | Status: AC
  Administered 2020-12-31: 650 mg via ORAL

## 2020-12-31 MED ORDER — SODIUM CHLORIDE 0.9% IV SOLUTION
250.0000 mL | Freq: Once | INTRAVENOUS | Status: AC
  Administered 2020-12-31: 250 mL via INTRAVENOUS
  Filled 2020-12-31: qty 250

## 2020-12-31 MED ORDER — DIPHENHYDRAMINE HCL 25 MG PO CAPS
ORAL_CAPSULE | ORAL | Status: AC
  Filled 2020-12-31: qty 1

## 2020-12-31 NOTE — Patient Instructions (Signed)

## 2021-01-01 ENCOUNTER — Telehealth: Payer: Self-pay

## 2021-01-01 ENCOUNTER — Inpatient Hospital Stay: Payer: Medicare Other

## 2021-01-01 ENCOUNTER — Other Ambulatory Visit: Payer: Self-pay

## 2021-01-01 ENCOUNTER — Encounter: Payer: Self-pay | Admitting: Hematology and Oncology

## 2021-01-01 VITALS — BP 150/75 | HR 86 | Resp 18

## 2021-01-01 DIAGNOSIS — C5702 Malignant neoplasm of left fallopian tube: Secondary | ICD-10-CM | POA: Diagnosis not present

## 2021-01-01 DIAGNOSIS — Z5111 Encounter for antineoplastic chemotherapy: Secondary | ICD-10-CM | POA: Diagnosis not present

## 2021-01-01 DIAGNOSIS — D539 Nutritional anemia, unspecified: Secondary | ICD-10-CM | POA: Diagnosis not present

## 2021-01-01 DIAGNOSIS — Z95828 Presence of other vascular implants and grafts: Secondary | ICD-10-CM

## 2021-01-01 LAB — FERRITIN: Ferritin: 202 ng/mL (ref 11–307)

## 2021-01-01 LAB — TYPE AND SCREEN
ABO/RH(D): O POS
Antibody Screen: NEGATIVE
Unit division: 0

## 2021-01-01 LAB — BPAM RBC
Blood Product Expiration Date: 202204142359
ISSUE DATE / TIME: 202203171443
Unit Type and Rh: 5100

## 2021-01-01 LAB — IRON AND TIBC
Iron: 47 ug/dL (ref 41–142)
Saturation Ratios: 14 % — ABNORMAL LOW (ref 21–57)
TIBC: 329 ug/dL (ref 236–444)
UIBC: 282 ug/dL (ref 120–384)

## 2021-01-01 MED ORDER — CYANOCOBALAMIN 1000 MCG/ML IJ SOLN
1000.0000 ug | Freq: Once | INTRAMUSCULAR | Status: AC
  Administered 2021-01-01: 1000 ug via INTRAMUSCULAR

## 2021-01-01 MED ORDER — CYANOCOBALAMIN 1000 MCG/ML IJ SOLN
INTRAMUSCULAR | Status: AC
  Filled 2021-01-01: qty 1

## 2021-01-01 NOTE — Assessment & Plan Note (Signed)
We discussed some of the risks, benefits, and alternatives of blood transfusions. The patient is symptomatic from anemia and the hemoglobin level is critically low.  Some of the side-effects to be expected including risks of transfusion reactions, chills, infection, syndrome of volume overload and risk of hospitalization from various reasons and the patient is willing to proceed and went ahead to sign consent today. I will give her a unit of blood I have ordered vitamin B12 and iron studies and vitamin B12 came back low.  Iron studies pending I will start her on aggressive vitamin B12 injection weekly I am hopeful with adequate replacement, she can resume chemotherapy safely

## 2021-01-01 NOTE — Assessment & Plan Note (Signed)
She has severe pancytopenia Her treatment is cancelled I have order additional work-up which revealed the patient have mineral deficiency I will schedule her to get blood transfusion support and vitamin B12 injection Iron studies are pending and if her iron study is low, she will also receive IV iron

## 2021-01-01 NOTE — Patient Instructions (Signed)

## 2021-01-01 NOTE — Telephone Encounter (Signed)
Called and left a message iron studies look good. No need for IV iron per Dr. Alvy Bimler.

## 2021-01-01 NOTE — Progress Notes (Signed)
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 The patient is symptomatic from anemia and the hemoglobin level is critically low.  Some of the side-effects to be expected including risks of transfusion reactions, chills, infection, syndrome of volume overload and risk of hospitalization from various reasons and the patient is willing to proceed and went ahead to sign consent today. I will give her a unit of blood I have ordered vitamin B12 and iron studies and vitamin B12 came back low.  Iron studies pending I will start her on aggressive vitamin B12 injection weekly I am hopeful with adequate replacement, she can resume chemotherapy safely   Orders Placed This Encounter  Procedures  . Iron and TIBC    Standing Status:   Future    Number of Occurrences:   1    Standing Expiration Date:   12/31/2021  . Ferritin    Standing Status:   Future    Number of Occurrences:   1    Standing Expiration Date:   12/31/2021  . Vitamin B12    Standing Status:   Future    Number of Occurrences:   1    Standing Expiration Date:   12/31/2021    All questions were answered. The patient knows to call the clinic with any problems, questions or concerns. The total time spent in the appointment was 30 minutes encounter with patients including review of chart and various tests results, discussions about plan of care and coordination of care plan   Artis Delay, MD 01/01/2021  9:04 AM  INTERVAL HISTORY: Please see below for problem oriented charting. She is seen for further follow-up She is noted to have profound anemia She has some fatigue but denies chest pain or shortness of breath The patient denies any recent signs or symptoms of bleeding such as spontaneous epistaxis, hematuria or hematochezia.   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 Extensive omental nodularity highly worrisome for peritoneal carcinomatosis. Late recurrence of melanoma can present as peritoneal carcinomatosis. Ovarian cancer more commonly presents in this manner. Patient does have a predominately low density right adnexal lesion measuring up to 3.5 cm, although this lesion is not typical for ovarian cancer. 2. No evidence of bowel or ureteral obstruction. 3. No other definite signs of metastatic disease. There are small indeterminate low-density hepatic lesions.   02/08/2016 Tumor Marker   Patient's tumor was tested for the following markers: CA-125 Results of the tumor marker test revealed 656.2   02/15/2016 Procedure   CT-guided core biopsy performed of omental mass just deep to  the abdominal wall.   02/15/2016 Pathology Results   Omentum, biopsy, left - PAPILLARY SEROUS NEOPLASM, SEE COMMENT. Microscopic Comment There are papillary collections of largely low grade appearing cells with numerous psammoma bodies. Given the limited material it is difficult to distinguish between invasive implants of a serous borderline tumor and serous carcinoma,  especially given the low grade appearance.   02/18/2016 Pathology Results   1. Omentum, resection for tumor INVASIVE IMPLANT OF HIGH GRADE SEROUS CARCINOMA 2. Uterus +/- tubes/ovaries, neoplastic HIGH GRADE SEROUS CARCINOMA INVOLVING LEFT TUBAL FIMBRIA SEROUS CARCINOMA WITH PREDOMINANT PSAMMOMA BODIES IMPLANT AT UTERUS SEROSA, BILATERAL FALLOPIAN TUBAL SEROSA, AND ANTERIOR PERITONEAL REFLECTION CERVIX: HISTOLOGICAL UNREMARKABLE ENDOMETRIUM: INACTIVE ENDOMETRIUM MYOMETRIUM: LEIOMYOMA LEFT OVARY: CYSTADENOFIBROMA RIGHT OVARY AND FALLOPIAN TUBE: HISTOLOGICAL UNREMARKABLE Microscopic Comment 2. ONCOLOGY TABLE - FALLOPIAN TUBE 1. Specimen, including laterality: Omentum, uterus, bilateral ovaries and fallopian tubes 2. Procedure: Hysterectomy, bilateral salpingo-oophorectomy and tumor debulking omentectomy 3. Lymph node sampling performed: No 4. Tumor site: uterus serosa, bilateral fallopian tubal serosa, peritoneal and omentum 5. Tumor location in fallopian tube: Left fallopian tubal fimbria 6. Specimen integrity (intact/ruptured/disrupted): Intact 7. Tumor size (cm): multi focal invasive omentum implant greater than 2 cm, left fallopian tube tumor 0.8 cm. 8. Histologic type: Serous carcinoma 9. Grade: 3 10. Microscopic tumor extension: uterus serosa, bilateral fallopian tubal serosa, peritoneal and omentum 11. Margins: NA 12. Lymph-Vascular invasion: identified 13. Lymph nodes: # examined: 0; # positive: NA 14. TNM: pT3c, pNx 15. FIGO Stage (based on pathologic findings, needs clinical correlation: IIIC 16. Comment: High grade serous carcinoma multifocally and extensively involves left fallopian tubal fimbria, omentum, uterus serosa, bilateral fallopian tubal serosa and peritoneal. At the left fallopian tube there are small foci of serous tubal intraepithelial carcinoma identified, so we conclude the carcinoma is fallopian tube origin   03/09/2016 Tumor Marker   Patient's tumor was tested  for the following markers: CA-125 Results of the tumor marker test revealed 154.4   03/15/2016 Procedure   Technically successful right IJ power-injectable port catheter placement. Ready for routine use.   03/18/2016 - 07/13/2016 Chemotherapy   The patient had 6 cycles of carboplatin and taxol   04/14/2016 Tumor Marker   Patient's tumor was tested for the following markers: CA-125 Results of the tumor marker test revealed 36.7   04/25/2016 Genetic Testing   Patient has genetic testing done for germline mutation Results revealed patient has no mutation   04/28/2016 Tumor Marker   Patient's tumor was tested for the following markers: CA-125 Results of the tumor marker test revealed 24.6   06/21/2016 Tumor Marker   Patient's tumor was tested for the following markers: CA-125 Results of the tumor marker test revealed 16.6   08/01/2016 Tumor Marker   Patient's tumor was tested for the following markers: CA-125 Results of the tumor marker test revealed 17.2   08/05/2016 Imaging   Interval TAH-BSO. No evidence of residual pelvic mass or metastatic disease within the abdomen or pelvis. No other acute findings.    11/04/2016 Tumor Marker   Patient's tumor was tested for the following markers: CA-125 Results of the tumor marker test revealed 13.6   01/20/2017 Tumor Marker   Patient's tumor was tested for the following markers: CA-125 Results of the tumor marker test revealed 13.8   04/14/2017 Tumor Marker   Patient's tumor was tested for the following markers: CA-125 Results of the tumor marker test revealed 16.1   06/13/2017 Imaging   No acute findings.  No mass or hernia identified.  Colonic diverticulosis, without radiographic evidence of diverticulitis.  Aortic atherosclerosis.   07/03/2017 Tumor Marker   Patient's tumor was tested for the following markers: CA-125 Results of the tumor marker test revealed 18.4   09/19/2017 Tumor Marker   Patient's tumor was tested for the  following markers: CA-125 Results of the tumor marker test revealed 18.5   01/23/2018 Tumor Marker   Patient's tumor was tested for the following markers: CA-125 Results of the tumor marker test revealed 35.4   01/30/2018 Imaging   1. No evidence of local fallopian tube carcinoma recurrence within the pelvis. Post hysterectomy. 2. No evidence of metastatic peritoneal disease or omental disease. No solid organ metastasis.    03/20/2018 Tumor Marker   Patient's tumor was tested for the following markers: CA-125 Results of the tumor marker test revealed 73   05/02/2018 Tumor Marker   Patient's tumor was tested for the following markers: CA-125 Results of the tumor marker test revealed 127.1   05/08/2018 Imaging   CT abdomen and pelvis 1. 7 cm left subdiaphragmatic collection of loculated fluid versus low-density soft tissue. Given the patient's history of rising CA 125, metastatic disease considered highly likely.  2. Areas of fluid or recurrent disease identified in the right lower quadrant adjacent to the cecum and along right lower quadrant small bowel loops.   05/14/2018 Tumor Marker   Patient's tumor was tested for the following markers: CA-125 Results of the tumor marker test revealed 166    Genetic Testing   Patient has genetic testing done for ER/PR and MMR. Results revealed patient has the following on 02/18/2016 surgical pathology: ER 20%, PR 0% MMR: normal    Genetic Testing   Patient has genetic testing done for MSI. Results revealed patient has the following: MSI stable   05/18/2018 - 12/31/2018 Chemotherapy   The patient had carboplatin and Doxil   07/16/2018 Tumor Marker   Patient's tumor was tested for the following markers: CA-125 Results of the tumor marker test revealed 28.7   08/10/2018 Imaging   CT imaging:  Decreased size of peritoneal soft tissue masses in left upper quadrant, consistent with decreased metastatic disease.  No new or progressive metastatic  disease. No other acute findings.  Colonic diverticulosis, without radiographic evidence of diverticulitis.  Stable small paraumbilical ventral hernia containing transverse colon.   08/13/2018 Tumor Marker   Patient's tumor was tested for the following markers: CA-125 Results of the tumor marker test revealed 21.6   08/24/2018 Echocardiogram   LV EF: 60% -  65%   10/15/2018 Tumor Marker   Patient's tumor was tested for the following markers: CA-125 Results of the tumor marker test revealed 20.1   10/19/2018 Imaging   Ct scan of abdomen and pelvis Status post hysterectomy, bilateral salpingo-oophorectomy, and omentectomy.  Two peritoneal implants in the left upper abdomen are stable versus mildly decreased, as above. No abdominopelvic ascites.  No evidence of new/progressive metastatic disease.     11/19/2018 Tumor Marker   Patient's tumor was tested for the following markers: CA-125 Results of the tumor marker test revealed 21.7   11/23/2018 Echocardiogram   1. The left ventricle has normal systolic function of 60-65%. The cavity size was normal. There is no increased left ventricular wall thickness. Echo evidence of impaired diastolic relaxation.  2. The right ventricle has normal systolic function. The cavity was normal. There is no increase in right ventricular wall thickness.  3. The mitral valve is normal in structure. There is mild mitral annular  calcification present.  4. The tricuspid valve is normal in structure.  5. The aortic valve is tricuspid There is mild thickening of the aortic valve.  6. The pulmonic valve was normal in structure. Pulmonic valve regurgitation is mild by color flow Doppler.  7. Normal LV systolic function; mild diastolic dysfunction.   12/31/2018 Tumor Marker   Patient's tumor was tested for the following markers: CA-125 Results of the tumor marker test revealed 17.1   01/31/2019 Imaging   1. Left upper quadrant peritoneal implants described  previously have decreased in the interval. No new or progressive findings in the abdomen/pelvis today. No free fluid. 2. Right paraumbilical ventral hernia contains a small knuckle of transverse colon without complicating features. 3.  Aortic Atherosclerois (ICD10-170.0)  Aortic Atherosclerosis (ICD10-I70.0).   01/31/2019 Tumor Marker   Patient's tumor was tested for the following markers: CA-125 Results of the tumor marker test revealed 18.9   02/11/2019 - 03/13/2019 Chemotherapy   The patient is taking Niraparib. She self discontinued after 1 month   02/25/2019 Tumor Marker   Patient's tumor was tested for the following markers: CA-125 Results of the tumor marker test revealed 18.6   05/13/2019 Tumor Marker   Patient's tumor was tested for the following markers: CA-125 Results of the tumor marker test revealed 230   05/22/2019 Imaging   1. Increased size of 4.6 cm soft tissue mass in the gastrosplenic ligament, consistent with metastatic disease. 2. No other sites of metastatic disease identified within the abdomen or pelvis. 3. Colonic diverticulosis. No radiographic evidence of diverticulitis. 4. Stable small right paraumbilical hernia containing transverse colon.   Aortic Atherosclerosis (ICD10-I70.0).   05/31/2019 - 08/22/2019 Chemotherapy   The patient had bevacizumab and Taxol for chemotherapy treatment.     05/31/2019 Tumor Marker   Patient's tumor was tested for the following markers: CA-125 Results of the tumor marker test revealed 84   06/21/2019 Tumor Marker   Patient's tumor was tested for the following markers: CA-125 Results of the tumor marker test revealed 49.4.   07/19/2019 Tumor Marker   Patient's tumor was tested for the following markers: CA-125. Results of the tumor marker test revealed 44.5   08/22/2019 Imaging   1. Increase in size of 5.0 cm mass in the gastro splenic ligament consistent with metastatic disease. 2. No additional sites of disease identified  within the abdomen or pelvis. 3. Unchanged paraumbilical hernia containing nonobstructed loop of large bowel. 4.  Aortic Atherosclerosis (ICD10-I70.0).     08/30/2019 Tumor Marker   Patient's tumor was tested for the following markers: CA-125 Results of the tumor marker test revealed 71.5   08/30/2019 - 05/11/2020 Chemotherapy   The patient had gemzar for chemotherapy treatment.     09/27/2019 Tumor Marker   Patient's tumor was tested for the following markers: CA-125. Results of the tumor marker test revealed 38.5   10/14/2019 Tumor Marker   Patient's tumor was tested for the following markers: CA-125 Results of the tumor marker test revealed 40.8   10/21/2019 Tumor Marker   Patient's tumor was tested for the following markers: CA-125 Results of the tumor marker test revealed 34.8.   11/22/2019 Imaging   1. Interval decrease in size of left upper quadrant cystic and solid peritoneal lesion. 2. No new sites of disease. 3. Unchanged periumbilical hernia containing a nonobstructed loop of small bowel. 4.  Aortic Atherosclerosis (ICD10-I70.0).     11/25/2019 Tumor Marker   Patient's tumor was tested for the following markers: CA-125 Results  of the tumor marker test revealed 22.7   12/23/2019 Tumor Marker   Patient's tumor was tested for the following markers: CA-125 Results of the tumor marker test revealed 26.3   01/20/2020 Tumor Marker   Patient's tumor was tested for the following markers: CA-125 Results of the tumor marker test revealed 28.3   02/14/2020 Imaging   1. 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 2. No new or progressive disease identified within the abdomen or pelvis. 3. Colonic diverticulosis. No radiographic evidence of diverticulitis. 4. Small paraumbilical ventral hernia.     02/17/2020 Tumor Marker   Patient's tumor was tested for the following markers: CA-125 Results of the tumor marker test revealed 40.1    05/11/2020 Tumor Marker   Patient's tumor was tested for the following markers: CA-125 Results of the tumor marker test revealed 113.   05/22/2020 Imaging   1. 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 2. No evidence of ascites, bowel or ureteral obstruction. 3. 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 No evidence of incarceration or obstruction. 4. Aortic Atherosclerosis (ICD10-I70.0).   05/22/2020 Tumor Marker   Patient's tumor was tested for the following markers: CA-125 Results of the tumor marker test revealed 118   05/29/2020 Echocardiogram   1. Normal GLS -19.8. Left ventricular ejection fraction, by estimation, is 55 to 60%. The left ventricle has normal function. The left ventricle has no regional wall motion abnormalities. Left ventricular diastolic parameters were normal.  2. Right ventricular systolic function is normal. The right ventricular size is normal.  3. The mitral valve is degenerative. Trivial mitral valve regurgitation. No evidence of mitral stenosis.  4. The aortic valve is tricuspid. Aortic valve regurgitation is not visualized. Mild to moderate aortic valve sclerosis/calcification is present, without any evidence of aortic stenosis.  5. The inferior vena cava is normal in size with greater than 50% respiratory variability, suggesting right atrial pressure of 3 mmHg.   06/01/2020 - 10/29/2020 Chemotherapy   The patient had carboplatin and doxil for chemotherapy treatment for 6 cycles    06/01/2020 Tumor Marker   Patient's tumor was tested for the following markers: CA-125 Results of the tumor marker test revealed 126.   06/29/2020 Tumor Marker   Patient's tumor was tested for the following markers: CA-125. Results of the tumor marker test revealed 108   07/27/2020 Tumor Marker   Patient's tumor was tested for the  following markers: CA-125. Results of the tumor marker test revealed 89.1   08/18/2020 Imaging   1. Interval positive response to therapy. 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 No new or progressive metastatic disease in the abdomen or pelvis. 2. Chronic findings include: Small hiatal hernia. Mild to moderate colonic diverticulosis. Aortic Atherosclerosis (ICD10-I70.0).   08/24/2020 Tumor Marker   Patient's tumor was tested for the following markers: CA-125 Results of the tumor marker test revealed 81.2   08/25/2020 Echocardiogram   1. Left ventricular ejection fraction, by estimation, is 60 to 65%. The left ventricle has normal function. The left ventricle has no regional wall motion abnormalities. Left ventricular diastolic parameters were normal. The average left ventricular global longitudinal strain is -21.4 %. The global longitudinal strain is normal.  2. Right ventricular systolic function is normal. The right ventricular size is normal.  3. The mitral valve  is normal in structure. No evidence of mitral valve regurgitation. No evidence of mitral stenosis.  4. The aortic valve is tricuspid. There is moderate calcification of the aortic valve. There is moderate thickening of the aortic valve. Aortic valve regurgitation is not visualized. Mild to moderate aortic valve sclerosis/calcification is present, without any evidence of aortic stenosis.  5. Aortic dilatation noted. There is mild dilatation of the aortic root, measuring 37 mm.  6. The inferior vena cava is normal in size with greater than 50% respiratory variability, suggesting right atrial pressure of 3 mmHg.   09/21/2020 Tumor Marker   Patient's tumor was tested for the following markers: CA-125 Results of the tumor marker test revealed 72.7.   10/01/2020 Tumor Marker   Patient's tumor was tested for the following markers: CA-125 Results of the tumor marker test revealed  80.9   10/29/2020 Tumor Marker   Patient's tumor was tested for the following markers: CA-125 Results of the tumor marker test revealed 88   11/12/2020 Imaging   1. 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 2. Small right paramidline paraumbilical ventral hernia containing a short segment of mid transverse colon, without evidence of bowel incarceration or obstruction at this time. 3. Colonic diverticulosis without evidence of acute diverticulitis at this time. 4. Aortic atherosclerosis, in addition to left main coronary artery disease. Please note that although the presence of coronary artery calcium documents the presence of coronary artery disease, the severity of this disease and any potential stenosis cannot be assessed on this non-gated CT examination. Assessment for potential risk factor modification, dietary therapy or pharmacologic therapy may be warranted, if clinically indicated. 5. Additional incidental findings, as above.   11/26/2020 -  Chemotherapy    Patient is on Treatment Plan: OVARIAN TOPOTECAN D1,8, Q21D      11/26/2020 Tumor Marker   Patient's tumor was tested for the following markers CA-125 Results of the tumor marker test revealed 89.1   12/03/2020 Tumor Marker   Patient's tumor was tested for the following markers: CA-125 Results of the tumor marker test revealed 84.3     REVIEW OF SYSTEMS:   Constitutional: Denies fevers, chills or abnormal weight loss Eyes: Denies blurriness of vision Ears, nose, mouth, throat, and face: Denies mucositis or sore throat Respiratory: Denies cough, dyspnea or wheezes Cardiovascular: Denies palpitation, chest discomfort or lower extremity swelling Gastrointestinal:  Denies nausea, heartburn or change in bowel habits Skin: Denies abnormal skin rashes Lymphatics: Denies new lymphadenopathy or easy bruising Neurological:Denies numbness, tingling or new weaknesses Behavioral/Psych: Mood is stable,  no new changes  All other systems were reviewed with the patient and are negative.  I have reviewed the past medical history, past surgical history, social history and family history with the patient and they are unchanged from previous note.  ALLERGIES:  has No Known Allergies.  MEDICATIONS:  Current Outpatient Medications  Medication Sig Dispense Refill  . Cholecalciferol (VITAMIN D) 2000 units CAPS Take 1 capsule by mouth daily.    Marland Kitchen ezetimibe (ZETIA) 10 MG tablet Take 10 mg by mouth daily.    Marland Kitchen lidocaine-prilocaine (EMLA) cream Apply to affected area once 30 g 3  . ondansetron (ZOFRAN) 8 MG tablet Take 1 tablet (8 mg total) by mouth 2 (two) times daily as needed (Nausea or vomiting). 30 tablet 1  . prochlorperazine (COMPAZINE) 10 MG tablet Take 1 tablet (10 mg total) by mouth every 6 (six) hours as needed (Nausea or vomiting). 60 tablet 1  No current facility-administered medications for this visit.    PHYSICAL EXAMINATION: ECOG PERFORMANCE STATUS: 1 - Symptomatic but completely ambulatory GENERAL:alert, no distress and comfortable.  She looks pale  NEURO: alert & oriented x 3 with fluent speech, no focal motor/sensory deficits  LABORATORY DATA:  I have reviewed the data as listed    Component Value Date/Time   NA 132 (L) 12/31/2020 1251   NA 139 06/07/2017 1430   K 4.9 12/31/2020 1251   K 3.8 06/07/2017 1430   CL 100 12/31/2020 1251   CO2 23 12/31/2020 1251   CO2 27 06/07/2017 1430   GLUCOSE 115 (H) 12/31/2020 1251   GLUCOSE 124 06/07/2017 1430   BUN 21 12/31/2020 1251   BUN 14.0 06/07/2017 1430   CREATININE 1.00 12/31/2020 1251   CREATININE 0.8 06/07/2017 1430   CALCIUM 8.8 (L) 12/31/2020 1251   CALCIUM 9.3 06/07/2017 1430   PROT 6.6 12/31/2020 1251   PROT 6.6 09/30/2016 1027   ALBUMIN 3.7 12/31/2020 1251   ALBUMIN 3.5 09/30/2016 1027   AST 16 12/31/2020 1251   AST 18 09/30/2016 1027   ALT 16 12/31/2020 1251   ALT 21 09/30/2016 1027   ALKPHOS 108 12/31/2020  1251   ALKPHOS 77 09/30/2016 1027   BILITOT 0.2 (L) 12/31/2020 1251   BILITOT 0.37 09/30/2016 1027   GFRNONAA 57 (L) 12/31/2020 1251   GFRAA >60 06/29/2020 0913   GFRAA >60 01/20/2020 1029    No results found for: SPEP, UPEP  Lab Results  Component Value Date   WBC 4.1 12/31/2020   NEUTROABS 2.4 12/31/2020   HGB 7.8 (L) 12/31/2020   HCT 23.1 (L) 12/31/2020   MCV 96.7 12/31/2020   PLT 213 12/31/2020      Chemistry      Component Value Date/Time   NA 132 (L) 12/31/2020 1251   NA 139 06/07/2017 1430   K 4.9 12/31/2020 1251   K 3.8 06/07/2017 1430   CL 100 12/31/2020 1251   CO2 23 12/31/2020 1251   CO2 27 06/07/2017 1430   BUN 21 12/31/2020 1251   BUN 14.0 06/07/2017 1430   CREATININE 1.00 12/31/2020 1251   CREATININE 0.8 06/07/2017 1430      Component Value Date/Time   CALCIUM 8.8 (L) 12/31/2020 1251   CALCIUM 9.3 06/07/2017 1430   ALKPHOS 108 12/31/2020 1251   ALKPHOS 77 09/30/2016 1027   AST 16 12/31/2020 1251   AST 18 09/30/2016 1027   ALT 16 12/31/2020 1251   ALT 21 09/30/2016 1027   BILITOT 0.2 (L) 12/31/2020 1251   BILITOT 0.37 09/30/2016 1027

## 2021-01-01 NOTE — Telephone Encounter (Signed)
Called per Dr. Alvy Bimler and given B12 results. She will need b12 injection weekly x4. She will come in today at 2 pm. Injections scheduled.

## 2021-01-08 ENCOUNTER — Inpatient Hospital Stay: Payer: Medicare Other

## 2021-01-08 ENCOUNTER — Other Ambulatory Visit: Payer: Self-pay

## 2021-01-08 VITALS — BP 134/65 | HR 85 | Temp 98.3°F | Resp 18

## 2021-01-08 DIAGNOSIS — Z5111 Encounter for antineoplastic chemotherapy: Secondary | ICD-10-CM | POA: Diagnosis not present

## 2021-01-08 DIAGNOSIS — C5702 Malignant neoplasm of left fallopian tube: Secondary | ICD-10-CM

## 2021-01-08 DIAGNOSIS — D539 Nutritional anemia, unspecified: Secondary | ICD-10-CM | POA: Diagnosis not present

## 2021-01-08 DIAGNOSIS — Z95828 Presence of other vascular implants and grafts: Secondary | ICD-10-CM

## 2021-01-08 MED ORDER — CYANOCOBALAMIN 1000 MCG/ML IJ SOLN
1000.0000 ug | Freq: Once | INTRAMUSCULAR | Status: AC
  Administered 2021-01-08: 1000 ug via INTRAMUSCULAR

## 2021-01-08 MED ORDER — CYANOCOBALAMIN 1000 MCG/ML IJ SOLN
INTRAMUSCULAR | Status: AC
  Filled 2021-01-08: qty 1

## 2021-01-08 NOTE — Patient Instructions (Signed)

## 2021-01-12 DIAGNOSIS — L720 Epidermal cyst: Secondary | ICD-10-CM | POA: Diagnosis not present

## 2021-01-12 DIAGNOSIS — D485 Neoplasm of uncertain behavior of skin: Secondary | ICD-10-CM | POA: Diagnosis not present

## 2021-01-12 DIAGNOSIS — Z85820 Personal history of malignant melanoma of skin: Secondary | ICD-10-CM | POA: Diagnosis not present

## 2021-01-12 DIAGNOSIS — L570 Actinic keratosis: Secondary | ICD-10-CM | POA: Diagnosis not present

## 2021-01-14 ENCOUNTER — Other Ambulatory Visit: Payer: Self-pay

## 2021-01-14 ENCOUNTER — Inpatient Hospital Stay: Payer: Medicare Other

## 2021-01-14 VITALS — BP 125/64 | HR 88 | Temp 97.0°F | Resp 16

## 2021-01-14 DIAGNOSIS — D539 Nutritional anemia, unspecified: Secondary | ICD-10-CM | POA: Diagnosis not present

## 2021-01-14 DIAGNOSIS — Z95828 Presence of other vascular implants and grafts: Secondary | ICD-10-CM

## 2021-01-14 DIAGNOSIS — Z5111 Encounter for antineoplastic chemotherapy: Secondary | ICD-10-CM | POA: Diagnosis not present

## 2021-01-14 DIAGNOSIS — C5702 Malignant neoplasm of left fallopian tube: Secondary | ICD-10-CM

## 2021-01-14 DIAGNOSIS — Z7189 Other specified counseling: Secondary | ICD-10-CM

## 2021-01-14 DIAGNOSIS — D61818 Other pancytopenia: Secondary | ICD-10-CM

## 2021-01-14 LAB — CBC WITH DIFFERENTIAL (CANCER CENTER ONLY)
Abs Immature Granulocytes: 0.01 10*3/uL (ref 0.00–0.07)
Basophils Absolute: 0 10*3/uL (ref 0.0–0.1)
Basophils Relative: 1 %
Eosinophils Absolute: 0.1 10*3/uL (ref 0.0–0.5)
Eosinophils Relative: 3 %
HCT: 28.5 % — ABNORMAL LOW (ref 36.0–46.0)
Hemoglobin: 9.4 g/dL — ABNORMAL LOW (ref 12.0–15.0)
Immature Granulocytes: 0 %
Lymphocytes Relative: 24 %
Lymphs Abs: 1.2 10*3/uL (ref 0.7–4.0)
MCH: 31.6 pg (ref 26.0–34.0)
MCHC: 33 g/dL (ref 30.0–36.0)
MCV: 96 fL (ref 80.0–100.0)
Monocytes Absolute: 0.6 10*3/uL (ref 0.1–1.0)
Monocytes Relative: 13 %
Neutro Abs: 2.8 10*3/uL (ref 1.7–7.7)
Neutrophils Relative %: 59 %
Platelet Count: 258 10*3/uL (ref 150–400)
RBC: 2.97 MIL/uL — ABNORMAL LOW (ref 3.87–5.11)
RDW: 13.6 % (ref 11.5–15.5)
WBC Count: 4.7 10*3/uL (ref 4.0–10.5)
nRBC: 0 % (ref 0.0–0.2)

## 2021-01-14 LAB — SAMPLE TO BLOOD BANK

## 2021-01-14 LAB — CMP (CANCER CENTER ONLY)
ALT: 15 U/L (ref 0–44)
AST: 16 U/L (ref 15–41)
Albumin: 3.7 g/dL (ref 3.5–5.0)
Alkaline Phosphatase: 99 U/L (ref 38–126)
Anion gap: 12 (ref 5–15)
BUN: 20 mg/dL (ref 8–23)
CO2: 23 mmol/L (ref 22–32)
Calcium: 8.8 mg/dL — ABNORMAL LOW (ref 8.9–10.3)
Chloride: 101 mmol/L (ref 98–111)
Creatinine: 0.92 mg/dL (ref 0.44–1.00)
GFR, Estimated: >60 mL/min (ref >60)
Glucose, Bld: 98 mg/dL (ref 70–99)
Potassium: 5 mmol/L (ref 3.5–5.1)
Sodium: 136 mmol/L (ref 135–145)
Total Bilirubin: 0.2 mg/dL — ABNORMAL LOW (ref 0.3–1.2)
Total Protein: 6.7 g/dL (ref 6.5–8.1)

## 2021-01-14 MED ORDER — TOPOTECAN HCL CHEMO INJECTION 4 MG
2.3000 mg/m2 | Freq: Once | INTRAVENOUS | Status: AC
  Administered 2021-01-14: 4 mg via INTRAVENOUS
  Filled 2021-01-14: qty 4

## 2021-01-14 MED ORDER — SODIUM CHLORIDE 0.9% FLUSH
10.0000 mL | INTRAVENOUS | Status: DC | PRN
Start: 2021-01-14 — End: 2021-01-14
  Filled 2021-01-14: qty 10

## 2021-01-14 MED ORDER — PROCHLORPERAZINE MALEATE 10 MG PO TABS
ORAL_TABLET | ORAL | Status: AC
  Filled 2021-01-14: qty 1

## 2021-01-14 MED ORDER — CYANOCOBALAMIN 1000 MCG/ML IJ SOLN
INTRAMUSCULAR | Status: AC
  Filled 2021-01-14: qty 1

## 2021-01-14 MED ORDER — CYANOCOBALAMIN 1000 MCG/ML IJ SOLN
1000.0000 ug | Freq: Once | INTRAMUSCULAR | Status: AC
  Administered 2021-01-14: 1000 ug via INTRAMUSCULAR

## 2021-01-14 MED ORDER — SODIUM CHLORIDE 0.9% FLUSH
10.0000 mL | Freq: Once | INTRAVENOUS | Status: AC
Start: 2021-01-14 — End: 2021-01-14
  Administered 2021-01-14: 10 mL
  Filled 2021-01-14: qty 10

## 2021-01-14 MED ORDER — SODIUM CHLORIDE 0.9 % IV SOLN
Freq: Once | INTRAVENOUS | Status: AC
  Filled 2021-01-14: qty 250

## 2021-01-14 MED ORDER — PROCHLORPERAZINE MALEATE 10 MG PO TABS
10.0000 mg | ORAL_TABLET | Freq: Once | ORAL | Status: AC
  Administered 2021-01-14: 10 mg via ORAL

## 2021-01-14 MED ORDER — HEPARIN SOD (PORK) LOCK FLUSH 100 UNIT/ML IV SOLN
500.0000 [IU] | Freq: Once | INTRAVENOUS | Status: DC | PRN
  Filled 2021-01-14: qty 5

## 2021-01-14 MED ORDER — TOPOTECAN HCL CHEMO INJECTION 4 MG
2.4000 mg/m2 | Freq: Once | INTRAVENOUS | Status: DC
  Filled 2021-01-14: qty 4.2

## 2021-01-14 NOTE — Patient Instructions (Signed)
[facility] Discharge Instructions for Patients Receiving Chemotherapy  Today you received the following chemotherapy agents Hycamtin.  To help prevent nausea and vomiting after your treatment, we encourage you to take your nausea medication as directed.  If you develop nausea and vomiting that is not controlled by your nausea medication, call the clinic.   BELOW ARE SYMPTOMS THAT SHOULD BE REPORTED IMMEDIATELY:  *FEVER GREATER THAN 100.5 F  *CHILLS WITH OR WITHOUT FEVER  NAUSEA AND VOMITING THAT IS NOT CONTROLLED WITH YOUR NAUSEA MEDICATION  *UNUSUAL SHORTNESS OF BREATH  *UNUSUAL BRUISING OR BLEEDING  TENDERNESS IN MOUTH AND THROAT WITH OR WITHOUT PRESENCE OF ULCERS  *URINARY PROBLEMS  *BOWEL PROBLEMS  UNUSUAL RASH Items with * indicate a potential emergency and should be followed up as soon as possible.  Feel free to call the clinic should you have any questions or concerns. The clinic phone number is [phone].  Please show the CHEMO ALERT CARD at check-in to the Emergency Department and triage nurse.   

## 2021-01-21 ENCOUNTER — Encounter: Payer: Self-pay | Admitting: Hematology and Oncology

## 2021-01-21 ENCOUNTER — Inpatient Hospital Stay: Payer: Medicare Other

## 2021-01-21 ENCOUNTER — Inpatient Hospital Stay: Payer: Medicare Other | Attending: Hematology and Oncology

## 2021-01-21 ENCOUNTER — Other Ambulatory Visit: Payer: Self-pay

## 2021-01-21 ENCOUNTER — Inpatient Hospital Stay (HOSPITAL_BASED_OUTPATIENT_CLINIC_OR_DEPARTMENT_OTHER): Payer: Medicare Other | Admitting: Hematology and Oncology

## 2021-01-21 DIAGNOSIS — Z7189 Other specified counseling: Secondary | ICD-10-CM

## 2021-01-21 DIAGNOSIS — D61818 Other pancytopenia: Secondary | ICD-10-CM

## 2021-01-21 DIAGNOSIS — E538 Deficiency of other specified B group vitamins: Secondary | ICD-10-CM | POA: Insufficient documentation

## 2021-01-21 DIAGNOSIS — Z95828 Presence of other vascular implants and grafts: Secondary | ICD-10-CM

## 2021-01-21 DIAGNOSIS — C5702 Malignant neoplasm of left fallopian tube: Secondary | ICD-10-CM

## 2021-01-21 DIAGNOSIS — C5700 Malignant neoplasm of unspecified fallopian tube: Secondary | ICD-10-CM | POA: Diagnosis not present

## 2021-01-21 DIAGNOSIS — Z90710 Acquired absence of both cervix and uterus: Secondary | ICD-10-CM | POA: Diagnosis not present

## 2021-01-21 DIAGNOSIS — N183 Chronic kidney disease, stage 3 unspecified: Secondary | ICD-10-CM | POA: Insufficient documentation

## 2021-01-21 DIAGNOSIS — Z90722 Acquired absence of ovaries, bilateral: Secondary | ICD-10-CM | POA: Diagnosis not present

## 2021-01-21 DIAGNOSIS — Z5111 Encounter for antineoplastic chemotherapy: Secondary | ICD-10-CM | POA: Diagnosis not present

## 2021-01-21 LAB — CBC WITH DIFFERENTIAL (CANCER CENTER ONLY)
Abs Immature Granulocytes: 0.03 10*3/uL (ref 0.00–0.07)
Basophils Absolute: 0 10*3/uL (ref 0.0–0.1)
Basophils Relative: 1 %
Eosinophils Absolute: 0.1 10*3/uL (ref 0.0–0.5)
Eosinophils Relative: 3 %
HCT: 25.9 % — ABNORMAL LOW (ref 36.0–46.0)
Hemoglobin: 8.9 g/dL — ABNORMAL LOW (ref 12.0–15.0)
Immature Granulocytes: 1 %
Lymphocytes Relative: 32 %
Lymphs Abs: 1.1 10*3/uL (ref 0.7–4.0)
MCH: 32.4 pg (ref 26.0–34.0)
MCHC: 34.4 g/dL (ref 30.0–36.0)
MCV: 94.2 fL (ref 80.0–100.0)
Monocytes Absolute: 0.4 10*3/uL (ref 0.1–1.0)
Monocytes Relative: 11 %
Neutro Abs: 1.7 10*3/uL (ref 1.7–7.7)
Neutrophils Relative %: 52 %
Platelet Count: 208 10*3/uL (ref 150–400)
RBC: 2.75 MIL/uL — ABNORMAL LOW (ref 3.87–5.11)
RDW: 13.4 % (ref 11.5–15.5)
WBC Count: 3.3 10*3/uL — ABNORMAL LOW (ref 4.0–10.5)
nRBC: 0 % (ref 0.0–0.2)

## 2021-01-21 LAB — CMP (CANCER CENTER ONLY)
ALT: 13 U/L (ref 0–44)
AST: 14 U/L — ABNORMAL LOW (ref 15–41)
Albumin: 3.8 g/dL (ref 3.5–5.0)
Alkaline Phosphatase: 106 U/L (ref 38–126)
Anion gap: 11 (ref 5–15)
BUN: 24 mg/dL — ABNORMAL HIGH (ref 8–23)
CO2: 23 mmol/L (ref 22–32)
Calcium: 8.6 mg/dL — ABNORMAL LOW (ref 8.9–10.3)
Chloride: 101 mmol/L (ref 98–111)
Creatinine: 1.03 mg/dL — ABNORMAL HIGH (ref 0.44–1.00)
GFR, Estimated: 55 mL/min — ABNORMAL LOW (ref >60)
Glucose, Bld: 105 mg/dL — ABNORMAL HIGH (ref 70–99)
Potassium: 4.1 mmol/L (ref 3.5–5.1)
Sodium: 135 mmol/L (ref 135–145)
Total Bilirubin: <0.2 mg/dL — ABNORMAL LOW (ref 0.3–1.2)
Total Protein: 6.7 g/dL (ref 6.5–8.1)

## 2021-01-21 LAB — SAMPLE TO BLOOD BANK

## 2021-01-21 MED ORDER — PROCHLORPERAZINE MALEATE 10 MG PO TABS
ORAL_TABLET | ORAL | Status: AC
  Filled 2021-01-21: qty 1

## 2021-01-21 MED ORDER — CYANOCOBALAMIN 1000 MCG/ML IJ SOLN
INTRAMUSCULAR | Status: AC
  Filled 2021-01-21: qty 1

## 2021-01-21 MED ORDER — PROCHLORPERAZINE MALEATE 10 MG PO TABS
10.0000 mg | ORAL_TABLET | Freq: Once | ORAL | Status: AC
  Administered 2021-01-21: 10 mg via ORAL

## 2021-01-21 MED ORDER — HEPARIN SOD (PORK) LOCK FLUSH 100 UNIT/ML IV SOLN
500.0000 [IU] | Freq: Once | INTRAVENOUS | Status: AC | PRN
  Administered 2021-01-21: 500 [IU]
  Filled 2021-01-21: qty 5

## 2021-01-21 MED ORDER — SODIUM CHLORIDE 0.9 % IV SOLN
Freq: Once | INTRAVENOUS | Status: AC
  Filled 2021-01-21: qty 250

## 2021-01-21 MED ORDER — SODIUM CHLORIDE 0.9% FLUSH
10.0000 mL | INTRAVENOUS | Status: DC | PRN
  Administered 2021-01-21: 10 mL
  Filled 2021-01-21: qty 10

## 2021-01-21 MED ORDER — ALTEPLASE 2 MG IJ SOLR
2.0000 mg | Freq: Once | INTRAMUSCULAR | Status: AC
Start: 2021-01-21 — End: 2021-01-21
  Administered 2021-01-21: 2 mg
  Filled 2021-01-21: qty 2

## 2021-01-21 MED ORDER — CYANOCOBALAMIN 1000 MCG/ML IJ SOLN
1000.0000 ug | Freq: Once | INTRAMUSCULAR | Status: AC
  Administered 2021-01-21: 1000 ug via INTRAMUSCULAR

## 2021-01-21 MED ORDER — TOPOTECAN HCL CHEMO INJECTION 4 MG
2.3000 mg/m2 | Freq: Once | INTRAVENOUS | Status: AC
  Administered 2021-01-21: 4 mg via INTRAVENOUS
  Filled 2021-01-21: qty 4

## 2021-01-21 NOTE — Assessment & Plan Note (Signed)
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

## 2021-01-21 NOTE — Patient Instructions (Signed)
[facility] Discharge Instructions for Patients Receiving Chemotherapy  Today you received the following chemotherapy agents: topotecan.  To help prevent nausea and vomiting after your treatment, we encourage you to take your nausea medication as directed.   If you develop nausea and vomiting that is not controlled by your nausea medication, call the clinic.   BELOW ARE SYMPTOMS THAT SHOULD BE REPORTED IMMEDIATELY:  *FEVER GREATER THAN 100.5 F  *CHILLS WITH OR WITHOUT FEVER  NAUSEA AND VOMITING THAT IS NOT CONTROLLED WITH YOUR NAUSEA MEDICATION  *UNUSUAL SHORTNESS OF BREATH  *UNUSUAL BRUISING OR BLEEDING  TENDERNESS IN MOUTH AND THROAT WITH OR WITHOUT PRESENCE OF ULCERS  *URINARY PROBLEMS  *BOWEL PROBLEMS  UNUSUAL RASH Items with * indicate a potential emergency and should be followed up as soon as possible.  Feel free to call the clinic should you have any questions or concerns. The clinic phone number is [phone].  Please show the [facility] at check-in to the Emergency Department and triage nurse.

## 2021-01-21 NOTE — Progress Notes (Signed)
[facility] OFFICE PROGRESS NOTE  Patient Care Team: [name], MD as PCP - General (Internal Medicine)  ASSESSMENT & PLAN:  Fallopian tube cancer, carcinoma ([place]) She tolerated treatment poorly due to severe pancytopenia and was subsequently found to have severe vitamin B12 deficiency She has received several doses of vitamin B12 without major improvement of her blood counts yet I suspect there is a major element of bone marrow suppression from chronic chemotherapy We will continue treatment as scheduled today I plan to repeat CT imaging next month for further follow-up She has an appointment to see another physician next week for second opinion  Pancytopenia, 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   All questions were answered. The patient knows to call the clinic with any problems, questions or concerns. The total time spent in the appointment was 20 minutes encounter with patients including review of chart and various tests results, discussions about plan of care and coordination of care plan   [name], MD 01/21/2021 2:22 PM  INTERVAL HISTORY: Please see below for problem oriented charting. She returns for further follow-up She was delayed today due to port not working The patient denies any recent signs or symptoms of bleeding such as spontaneous epistaxis, hematuria or hematochezia. 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 Extensive omental nodularity highly worrisome for peritoneal carcinomatosis. Late recurrence of melanoma can present as peritoneal carcinomatosis. Ovarian cancer more commonly presents in this manner. Patient does have a predominately low density right adnexal lesion measuring up to 3.5 cm, although this lesion is not typical for ovarian cancer. 2. No evidence of bowel or ureteral obstruction. 3. No other definite signs of metastatic disease. There are small indeterminate low-density hepatic lesions.   02/08/2016 Tumor Marker   Patient's tumor was tested for the following markers: CA-125 Results of the tumor marker test revealed 656.2   02/15/2016 Procedure   CT-guided core biopsy performed of omental mass just deep to the abdominal wall.   02/15/2016 Pathology Results   Omentum, biopsy, left - PAPILLARY SEROUS NEOPLASM, SEE COMMENT. Microscopic Comment There are papillary collections of largely low grade appearing cells with numerous psammoma bodies. Given the limited material it is difficult to distinguish between invasive implants of a serous borderline tumor and serous carcinoma, especially given the low grade appearance.   02/18/2016 Pathology Results   1. Omentum, resection for tumor INVASIVE IMPLANT OF HIGH GRADE SEROUS CARCINOMA 2. Uterus +/- tubes/ovaries, neoplastic HIGH GRADE SEROUS CARCINOMA INVOLVING LEFT TUBAL FIMBRIA SEROUS CARCINOMA WITH PREDOMINANT PSAMMOMA BODIES IMPLANT  AT UTERUS SEROSA, BILATERAL FALLOPIAN TUBAL  SEROSA, AND ANTERIOR PERITONEAL REFLECTION CERVIX: HISTOLOGICAL UNREMARKABLE ENDOMETRIUM: INACTIVE ENDOMETRIUM MYOMETRIUM: LEIOMYOMA LEFT OVARY: CYSTADENOFIBROMA RIGHT OVARY AND FALLOPIAN TUBE: HISTOLOGICAL UNREMARKABLE Microscopic Comment 2. ONCOLOGY TABLE - FALLOPIAN TUBE 1. Specimen, including laterality: Omentum, uterus, bilateral ovaries and fallopian tubes 2. Procedure: Hysterectomy, bilateral salpingo-oophorectomy and tumor debulking omentectomy 3. Lymph node sampling performed: No 4. Tumor site: uterus serosa, bilateral fallopian tubal serosa, peritoneal and omentum 5. Tumor location in fallopian tube: Left fallopian tubal fimbria 6. Specimen integrity (intact/ruptured/disrupted): Intact 7. Tumor size (cm): multi focal invasive omentum implant greater than 2 cm, left fallopian tube tumor 0.8 cm. 8. Histologic type: Serous carcinoma 9. Grade: 3 10. Microscopic tumor extension: uterus serosa, bilateral fallopian tubal serosa, peritoneal and omentum 11. Margins: NA 12. Lymph-Vascular invasion: identified 13. Lymph nodes: # examined: 0; # positive: NA 14. TNM: pT3c, pNx 15. FIGO Stage (based on pathologic findings, needs clinical correlation: IIIC 16. Comment: High grade serous carcinoma multifocally and extensively involves left fallopian tubal fimbria, omentum, uterus serosa, bilateral fallopian tubal serosa and peritoneal. At the left fallopian tube there are small foci of serous tubal intraepithelial carcinoma identified, so we conclude the carcinoma is fallopian tube origin   03/09/2016 Tumor Marker   Patient's tumor was tested for the following markers: CA-125 Results of the tumor marker test revealed 154.4   03/15/2016 Procedure   Technically successful right IJ power-injectable port catheter placement. Ready for routine use.   03/18/2016 - 07/13/2016 Chemotherapy   The patient had 6 cycles of carboplatin and taxol   04/14/2016 Tumor Marker   Patient's tumor was tested for  the following markers: CA-125 Results of the tumor marker test revealed 36.7   04/25/2016 Genetic Testing   Patient has genetic testing done for germline mutation Results revealed patient has no mutation   04/28/2016 Tumor Marker   Patient's tumor was tested for the following markers: CA-125 Results of the tumor marker test revealed 24.6   06/21/2016 Tumor Marker   Patient's tumor was tested for the following markers: CA-125 Results of the tumor marker test revealed 16.6   08/01/2016 Tumor Marker   Patient's tumor was tested for the following markers: CA-125 Results of the tumor marker test revealed 17.2   08/05/2016 Imaging   Interval TAH-BSO. No evidence of residual pelvic mass or metastatic disease within the abdomen or pelvis. No other acute findings.    11/04/2016 Tumor Marker   Patient's tumor was tested for the following markers: CA-125 Results of the tumor marker test revealed 13.6   01/20/2017 Tumor Marker   Patient's tumor was tested for the following markers: CA-125 Results of the tumor marker test revealed 13.8   04/14/2017 Tumor Marker   Patient's tumor was tested for the following markers: CA-125 Results of the tumor marker test revealed 16.1   06/13/2017 Imaging   No acute findings.  No mass or hernia identified.  Colonic diverticulosis, without radiographic evidence of diverticulitis.  Aortic atherosclerosis.   07/03/2017 Tumor Marker   Patient's tumor was tested for the following markers: CA-125 Results of the tumor marker test revealed 18.4   09/19/2017 Tumor Marker   Patient's tumor was tested for the following markers: CA-125 Results of the tumor marker test revealed 18.5   01/23/2018 Tumor Marker   Patient's tumor was tested for the following markers: CA-125 Results of the tumor marker test revealed 35.4   01/30/2018 Imaging   1. No evidence of local fallopian tube carcinoma recurrence within the  pelvis. Post hysterectomy. 2. No evidence of metastatic  peritoneal disease or omental disease. No solid organ metastasis.    03/20/2018 Tumor Marker   Patient's tumor was tested for the following markers: CA-125 Results of the tumor marker test revealed 73   05/02/2018 Tumor Marker   Patient's tumor was tested for the following markers: CA-125 Results of the tumor marker test revealed 127.1   05/08/2018 Imaging   CT abdomen and pelvis 1. 7 cm left subdiaphragmatic collection of loculated fluid versus low-density soft tissue. Given the patient's history of rising CA 125, metastatic disease considered highly likely.  2. Areas of fluid or recurrent disease identified in the right lower quadrant adjacent to the cecum and along right lower quadrant small bowel loops.   05/14/2018 Tumor Marker   Patient's tumor was tested for the following markers: CA-125 Results of the tumor marker test revealed 166    Genetic Testing   Patient has genetic testing done for ER/PR and MMR. Results revealed patient has the following on 02/18/2016 surgical pathology: ER 20%, PR 0% MMR: normal    Genetic Testing   Patient has genetic testing done for MSI. Results revealed patient has the following: MSI stable   05/18/2018 - 12/31/2018 Chemotherapy   The patient had carboplatin and Doxil   07/16/2018 Tumor Marker   Patient's tumor was tested for the following markers: CA-125 Results of the tumor marker test revealed 28.7   08/10/2018 Imaging   CT imaging:  Decreased size of peritoneal soft tissue masses in left upper quadrant, consistent with decreased metastatic disease.  No new or progressive metastatic disease. No other acute findings.  Colonic diverticulosis, without radiographic evidence of diverticulitis.  Stable small paraumbilical ventral hernia containing transverse colon.   08/13/2018 Tumor Marker   Patient's tumor was tested for the following markers: CA-125 Results of the tumor marker test revealed 21.6   08/24/2018 Echocardiogram   LV EF:  60% -  65%   10/15/2018 Tumor Marker   Patient's tumor was tested for the following markers: CA-125 Results of the tumor marker test revealed 20.1   10/19/2018 Imaging   Ct scan of abdomen and pelvis Status post hysterectomy, bilateral salpingo-oophorectomy, and omentectomy.  Two peritoneal implants in the left upper abdomen are stable versus mildly decreased, as above. No abdominopelvic ascites.  No evidence of new/progressive metastatic disease.     11/19/2018 Tumor Marker   Patient's tumor was tested for the following markers: CA-125 Results of the tumor marker test revealed 21.7   11/23/2018 Echocardiogram   1. The left ventricle has normal systolic function of 94-32%. The cavity size was normal. There is no increased left ventricular wall thickness. Echo evidence of impaired diastolic relaxation.  2. The right ventricle has normal systolic function. The cavity was normal. There is no increase in right ventricular wall thickness.  3. The mitral valve is normal in structure. There is mild mitral annular calcification present.  4. The tricuspid valve is normal in structure.  5. The aortic valve is tricuspid There is mild thickening of the aortic valve.  6. The pulmonic valve was normal in structure. Pulmonic valve regurgitation is mild by color flow Doppler.  7. Normal LV systolic function; mild diastolic dysfunction.   12/31/2018 Tumor Marker   Patient's tumor was tested for the following markers: CA-125 Results of the tumor marker test revealed 17.1   01/31/2019 Imaging   1. Left upper quadrant peritoneal implants described previously have decreased in the interval. No new or  progressive findings in the abdomen/pelvis today. No free fluid. 2. Right paraumbilical ventral hernia contains a small knuckle of transverse colon without complicating features. 3.  Aortic Atherosclerois (ICD10-170.0)  Aortic Atherosclerosis (ICD10-I70.0).   01/31/2019 Tumor Marker   Patient's tumor  was tested for the following markers: CA-125 Results of the tumor marker test revealed 18.9   02/11/2019 - 03/13/2019 Chemotherapy   The patient is taking Niraparib. She self discontinued after 1 month   02/25/2019 Tumor Marker   Patient's tumor was tested for the following markers: CA-125 Results of the tumor marker test revealed 18.6   05/13/2019 Tumor Marker   Patient's tumor was tested for the following markers: CA-125 Results of the tumor marker test revealed 230   05/22/2019 Imaging   1. Increased size of 4.6 cm soft tissue mass in the gastrosplenic ligament, consistent with metastatic disease. 2. No other sites of metastatic disease identified within the abdomen or pelvis. 3. Colonic diverticulosis. No radiographic evidence of diverticulitis. 4. Stable small right paraumbilical hernia containing transverse colon.   Aortic Atherosclerosis (ICD10-I70.0).   05/31/2019 - 08/22/2019 Chemotherapy   The patient had bevacizumab and Taxol for chemotherapy treatment.     05/31/2019 Tumor Marker   Patient's tumor was tested for the following markers: CA-125 Results of the tumor marker test revealed 84   06/21/2019 Tumor Marker   Patient's tumor was tested for the following markers: CA-125 Results of the tumor marker test revealed 49.4.   07/19/2019 Tumor Marker   Patient's tumor was tested for the following markers: CA-125. Results of the tumor marker test revealed 44.5   08/22/2019 Imaging   1. Increase in size of 5.0 cm mass in the gastro splenic ligament consistent with metastatic disease. 2. No additional sites of disease identified within the abdomen or pelvis. 3. Unchanged paraumbilical hernia containing nonobstructed loop of large bowel. 4.  Aortic Atherosclerosis (ICD10-I70.0).     08/30/2019 Tumor Marker   Patient's tumor was tested for the following markers: CA-125 Results of the tumor marker test revealed 71.5   08/30/2019 - 05/11/2020 Chemotherapy   The patient had gemzar  for chemotherapy treatment.     09/27/2019 Tumor Marker   Patient's tumor was tested for the following markers: CA-125. Results of the tumor marker test revealed 38.5   10/14/2019 Tumor Marker   Patient's tumor was tested for the following markers: CA-125 Results of the tumor marker test revealed 40.8   10/21/2019 Tumor Marker   Patient's tumor was tested for the following markers: CA-125 Results of the tumor marker test revealed 34.8.   11/22/2019 Imaging   1. Interval decrease in size of left upper quadrant cystic and solid peritoneal lesion. 2. No new sites of disease. 3. Unchanged periumbilical hernia containing a nonobstructed loop of small bowel. 4.  Aortic Atherosclerosis (ICD10-I70.0).     11/25/2019 Tumor Marker   Patient's tumor was tested for the following markers: CA-125 Results of the tumor marker test revealed 22.7   12/23/2019 Tumor Marker   Patient's tumor was tested for the following markers: CA-125 Results of the tumor marker test revealed 26.3   01/20/2020 Tumor Marker   Patient's tumor was tested for the following markers: CA-125 Results of the tumor marker test revealed 28.3   02/14/2020 Imaging   1. 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 2. No new or progressive disease identified within the abdomen or pelvis. 3. Colonic diverticulosis. No radiographic evidence of diverticulitis. 4. Small paraumbilical  ventral hernia.     02/17/2020 Tumor Marker   Patient's tumor was tested for the following markers: CA-125 Results of the tumor marker test revealed 40.1   05/11/2020 Tumor Marker   Patient's tumor was tested for the following markers: CA-125 Results of the tumor marker test revealed 113.   05/22/2020 Imaging   1. 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 2. No evidence of ascites, bowel or ureteral  obstruction. 3. 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 No evidence of incarceration or obstruction. 4. Aortic Atherosclerosis (ICD10-I70.0).   05/22/2020 Tumor Marker   Patient's tumor was tested for the following markers: CA-125 Results of the tumor marker test revealed 118   05/29/2020 Echocardiogram   1. Normal GLS -19.8. Left ventricular ejection fraction, by estimation, is 55 to 60%. The left ventricle has normal function. The left ventricle has no regional wall motion abnormalities. Left ventricular diastolic parameters were normal.  2. Right ventricular systolic function is normal. The right ventricular size is normal.  3. The mitral valve is degenerative. Trivial mitral valve regurgitation. No evidence of mitral stenosis.  4. The aortic valve is tricuspid. Aortic valve regurgitation is not visualized. Mild to moderate aortic valve sclerosis/calcification is present, without any evidence of aortic stenosis.  5. The inferior vena cava is normal in size with greater than 50% respiratory variability, suggesting right atrial pressure of 3 mmHg.   06/01/2020 - 10/29/2020 Chemotherapy   The patient had carboplatin and doxil for chemotherapy treatment for 6 cycles    06/01/2020 Tumor Marker   Patient's tumor was tested for the following markers: CA-125 Results of the tumor marker test revealed 126.   06/29/2020 Tumor Marker   Patient's tumor was tested for the following markers: CA-125. Results of the tumor marker test revealed 108   07/27/2020 Tumor Marker   Patient's tumor was tested for the following markers: CA-125. Results of the tumor marker test revealed 89.1   08/18/2020 Imaging   1. Interval positive response to therapy. 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 No new or progressive metastatic disease in the abdomen or pelvis. 2. Chronic findings include: Small hiatal hernia. Mild  to moderate colonic diverticulosis. Aortic Atherosclerosis (ICD10-I70.0).   08/24/2020 Tumor Marker   Patient's tumor was tested for the following markers: CA-125 Results of the tumor marker test revealed 81.2   08/25/2020 Echocardiogram   1. Left ventricular ejection fraction, by estimation, is 60 to 65%. The left ventricle has normal function. The left ventricle has no regional wall motion abnormalities. Left ventricular diastolic parameters were normal. The average left ventricular global longitudinal strain is -21.4 %. The global longitudinal strain is normal.  2. Right ventricular systolic function is normal. The right ventricular size is normal.  3. The mitral valve is normal in structure. No evidence of mitral valve regurgitation. No evidence of mitral stenosis.  4. The aortic valve is tricuspid. There is moderate calcification of the aortic valve. There is moderate thickening of the aortic valve. Aortic valve regurgitation is not visualized. Mild to moderate aortic valve sclerosis/calcification is present, without any evidence of aortic stenosis.  5. Aortic dilatation noted. There is mild dilatation of the aortic root, measuring 37 mm.  6. The inferior vena cava is normal in size with greater than 50% respiratory variability, suggesting right atrial pressure of 3 mmHg.  09/21/2020 Tumor Marker   Patient's tumor was tested for the following markers: CA-125 Results of the tumor marker test revealed 72.7.   10/01/2020 Tumor Marker   Patient's tumor was tested for the following markers: CA-125 Results of the tumor marker test revealed 80.9   10/29/2020 Tumor Marker   Patient's tumor was tested for the following markers: CA-125 Results of the tumor marker test revealed 88   11/12/2020 Imaging   1. 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 2. Small right paramidline paraumbilical ventral hernia containing a short segment of mid transverse colon, without  evidence of bowel incarceration or obstruction at this time. 3. Colonic diverticulosis without evidence of acute diverticulitis at this time. 4. Aortic atherosclerosis, in addition to left main coronary artery disease. Please note that although the presence of coronary artery calcium documents the presence of coronary artery disease, the severity of this disease and any potential stenosis cannot be assessed on this non-gated CT examination. Assessment for potential risk factor modification, dietary therapy or pharmacologic therapy may be warranted, if clinically indicated. 5. Additional incidental findings, as above.   11/26/2020 -  Chemotherapy    Patient is on Treatment Plan: OVARIAN TOPOTECAN D1,8, Q21D      11/26/2020 Tumor Marker   Patient's tumor was tested for the following markers CA-125 Results of the tumor marker test revealed 89.1   12/03/2020 Tumor Marker   Patient's tumor was tested for the following markers: CA-125 Results of the tumor marker test revealed 84.3     REVIEW OF SYSTEMS:   Constitutional: Denies fevers, chills or abnormal weight loss Eyes: Denies blurriness of vision Ears, nose, mouth, throat, and face: Denies mucositis or sore throat Respiratory: Denies cough, dyspnea or wheezes Cardiovascular: Denies palpitation, chest discomfort or lower extremity swelling Gastrointestinal:  Denies nausea, heartburn or change in bowel habits Skin: Denies abnormal skin rashes Lymphatics: Denies new lymphadenopathy or easy bruising Neurological:Denies numbness, tingling or new weaknesses Behavioral/Psych: Mood is stable, no new changes  All other systems were reviewed with the patient and are negative.  I have reviewed the past medical history, past surgical history, social history and family history with the patient and they are unchanged from previous note.  ALLERGIES:  has No Known Allergies.  MEDICATIONS:  Current Outpatient Medications  Medication Sig Dispense  Refill  . Cholecalciferol (VITAMIN D) 2000 units CAPS Take 1 capsule by mouth daily.    Marland Kitchen ezetimibe (ZETIA) 10 MG tablet Take 10 mg by mouth daily.    Marland Kitchen lidocaine-prilocaine (EMLA) cream Apply to affected area once 30 g 3  . ondansetron (ZOFRAN) 8 MG tablet Take 1 tablet (8 mg total) by mouth 2 (two) times daily as needed (Nausea or vomiting). 30 tablet 1  . prochlorperazine (COMPAZINE) 10 MG tablet Take 1 tablet (10 mg total) by mouth every 6 (six) hours as needed (Nausea or vomiting). 60 tablet 1   No current facility-administered medications for this visit.   Facility-Administered Medications Ordered in Other Visits  Medication Dose Route Frequency Provider Last Rate Last Admin  . cyanocobalamin ((VITAMIN B-12)) injection 1,000 mcg  1,000 mcg Intramuscular Once Alvy Bimler, Grantley Savage, MD      . heparin lock flush 100 unit/mL  500 Units Intracatheter Once PRN Alvy Bimler, Drema Eddington, MD      . sodium chloride flush (NS) 0.9 % injection 10 mL  10 mL Intracatheter PRN Fumio Vandam, MD      . topotecan (HYCAMTIN) 4 mg in sodium chloride 0.9 %  100 mL chemo infusion  2.3 mg/m2 (Order-Specific) Intravenous Once [name], MD        PHYSICAL EXAMINATION: ECOG PERFORMANCE STATUS: 1 - Symptomatic but completely ambulatory  Vitals:   01/21/21 1341  BP: (!) 143/72  Pulse: 79  Resp: 18  Temp: 97.7 F (36.5 C)  SpO2: 100%   Filed Weights   01/21/21 1341  Weight: 147 lb 9.6 oz (67 kg)    GENERAL:alert, no distress and comfortable Musculoskeletal:no cyanosis of digits and no clubbing  NEURO: alert & oriented x 3 with fluent speech, no focal motor/sensory deficits  LABORATORY DATA:  I have reviewed the data as listed    Component Value Date/Time   NA 135 01/21/2021 1223   NA 139 06/07/2017 1430   K 4.1 01/21/2021 1223   K 3.8 06/07/2017 1430   CL 101 01/21/2021 1223   CO2 23 01/21/2021 1223   CO2 27 06/07/2017 1430   GLUCOSE 105 (H) 01/21/2021 1223   GLUCOSE 124 06/07/2017 1430   BUN 24 (H) 01/21/2021  1223   BUN 14.0 06/07/2017 1430   CREATININE 1.03 (H) 01/21/2021 1223   CREATININE 0.8 06/07/2017 1430   CALCIUM 8.6 (L) 01/21/2021 1223   CALCIUM 9.3 06/07/2017 1430   PROT 6.7 01/21/2021 1223   PROT 6.6 09/30/2016 1027   ALBUMIN 3.8 01/21/2021 1223   ALBUMIN 3.5 09/30/2016 1027   AST 14 (L) 01/21/2021 1223   AST 18 09/30/2016 1027   ALT 13 01/21/2021 1223   ALT 21 09/30/2016 1027   ALKPHOS 106 01/21/2021 1223   ALKPHOS 77 09/30/2016 1027   BILITOT <0.2 (L) 01/21/2021 1223   BILITOT 0.37 09/30/2016 1027   GFRNONAA 55 (L) 01/21/2021 1223   GFRAA >60 06/29/2020 0913   GFRAA >60 01/20/2020 1029    No results found for: SPEP, UPEP  Lab Results  Component Value Date   WBC 3.3 (L) 01/21/2021   NEUTROABS 1.7 01/21/2021   HGB 8.9 (L) 01/21/2021   HCT 25.9 (L) 01/21/2021   MCV 94.2 01/21/2021   PLT 208 01/21/2021      Chemistry      Component Value Date/Time   NA 135 01/21/2021 1223   NA 139 06/07/2017 1430   K 4.1 01/21/2021 1223   K 3.8 06/07/2017 1430   CL 101 01/21/2021 1223   CO2 23 01/21/2021 1223   CO2 27 06/07/2017 1430   BUN 24 (H) 01/21/2021 1223   BUN 14.0 06/07/2017 1430   CREATININE 1.03 (H) 01/21/2021 1223   CREATININE 0.8 06/07/2017 1430      Component Value Date/Time   CALCIUM 8.6 (L) 01/21/2021 1223   CALCIUM 9.3 06/07/2017 1430   ALKPHOS 106 01/21/2021 1223   ALKPHOS 77 09/30/2016 1027   AST 14 (L) 01/21/2021 1223   AST 18 09/30/2016 1027   ALT 13 01/21/2021 1223   ALT 21 09/30/2016 1027   BILITOT <0.2 (L) 01/21/2021 1223   BILITOT 0.37 09/30/2016 1027

## 2021-01-21 NOTE — Assessment & Plan Note (Signed)
She tolerated treatment poorly due to severe pancytopenia and was subsequently found to have severe vitamin B12 deficiency She has received several doses of vitamin B12 without major improvement of her blood counts yet I suspect there is a major element of bone marrow suppression from chronic chemotherapy We will continue treatment as scheduled today I plan to repeat CT imaging next month for further follow-up She has an appointment to see another physician next week for second opinion

## 2021-01-21 NOTE — Assessment & Plan Note (Signed)
She has small intermittent elevated serum creatinine Could be due to dehydration or chronic kidney disease related to her age Observe closely for now This could contribute to anemia as well

## 2021-01-26 ENCOUNTER — Telehealth: Payer: Self-pay | Admitting: Hematology and Oncology

## 2021-01-26 NOTE — Telephone Encounter (Signed)
Scheduled appts per 4/7 sch msg. Pt aware.  

## 2021-01-27 ENCOUNTER — Other Ambulatory Visit: Payer: Self-pay

## 2021-01-27 ENCOUNTER — Inpatient Hospital Stay: Payer: Medicare Other | Attending: Obstetrics and Gynecology | Admitting: Obstetrics and Gynecology

## 2021-01-27 VITALS — BP 132/66 | HR 73 | Temp 98.3°F | Resp 16 | Wt 143.6 lb

## 2021-01-27 DIAGNOSIS — C5700 Malignant neoplasm of unspecified fallopian tube: Secondary | ICD-10-CM | POA: Diagnosis not present

## 2021-01-27 DIAGNOSIS — C4491 Basal cell carcinoma of skin, unspecified: Secondary | ICD-10-CM | POA: Insufficient documentation

## 2021-01-27 NOTE — Progress Notes (Signed)
Gynecologic Oncology Consult Visit   Referring Provider: Dr. Denman George & Dr. Alvy Bimler  Chief Complaint: Recurrent Fallopian Tube Cancer- 2nd opinion  Subjective:  Jessica Johnston is a 80 y.o. 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   Patient had history of benign right ovarian cyst and uterine fibroids and history of melanoma (early stage treated surgically by Dr. Linus Galas) for which she was seen by Dr. Fermin Schwab on 08/2014.  CA-125 thought to be slightly elevated at that time due to fibroids.  In 2016 she developed mild right upper quadrant pain which prompted CT scan which showed carcinomatosis with right adnexal lesion 2.8 x 2.7 x 3.5 cm without solid component, unremarkable left ovary, no ascites, no abdominal or pelvic adenopathy.  CA-125 was elevated at 656 on 02/08/2016.    CT-guided core needle biopsy of omental mass on 02/15/2016 with Dr. Denman George Omentum, biopsy, left - PAPILLARY SEROUS NEOPLASM, SEE COMMENT. Microscopic Comment: There are papillary collections of largely low grade appearing cells with numerous psammoma bodies. Given the limited material it is difficult to distinguish between invasive implants of a serous borderline tumor and serous carcinoma, especially given the low grade appearance.   Chest x-ray: small amount of right pleural thickening or fluid and linear atelectasis or scarring at the left lung base.    Exploratory laparotomy with TAH-BSO omentectomy, radical debulking by Dr. Denman George on 02/18/2016.  Intraoperative findings remarkable for 10 cm omental caking, malleolus studding, 1 mm pelvic surfaces, right colic gutter, right diaphragm and liver, mesentery of the transverse colon, and rectum 3 cm cystic mass in the right ovary.  Procedure included argon beam coagulation and miliary tumor implants with no residual disease apparent  completion.  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  Case discussed at multidisciplinary conference with recommendation for 6 cycles of adjuvant carboplatin-Taxol   CA 125 154.4 (03/09/16)  03/18/16 - Cycle 1- carboplatin - taxol 04/15/16 - Cycle 2- carboplatin. Taxol held due to neutropenia 05/06/16 - Cycle 3- Carboplatin - taxol  06/03/16 - Cycle 4- Carboplatin- taxol  06/24/16 - Cycle 5- Carboplatin- taxol  07/15/16 - Cycle 6- Carboplatin - taxol   Diagnosed with platinum-sensitive recurrence 05/18/18- 12/31/18 - 8 cycles - carbo - doxil 01/30/2018- No evidence of malignancy on imaging.   02/11/19-03/13/19- on niraparib. Self discontinued after 1 month.   Tumor marker rising. CT scan showed 7 cm left subdiaphragmatic collection of loculated fluid vs low density soft tissue. Areas of fluid vs recurrent disease in RLQ adjacent to cecum along RLQ small bowel loops  ER 20%, PR 0%, MMR normal, MSI Stable.   05/31/19 - 06/28/19 - cycle 1 taxol-bev. Taxol dose reduced to 64 mg/m2 at d15c1 07/12/19 - 08/09/19 cycle 2 taxol-bev Progressed  08/30/19 - 05/11/20- 11 cycles of single agent gemcitabine  06/01/20 - 10/29/20 - 6 cycles carboplatin-doxil Progressive metastatic disease to liver and peritoneal cavity.       11/26/20- 12/03/20 - cycle 1 topotecan 12/24/20- D1C2 topotecan D8C2 - held due to pancytopenia. Found to have severe b12 deficiency. Initiated treatment 01/14/21 - 01/21/21- cycle 3 topotecan  She is tolerating topotecan other than the bone marrow suppression. She required the one blood transfusion. She has no other adverse events to report. Her quality of life is good. Patient reports feeling well. She denies  complaints and is relatively asymptomatic of her malignancy. Good performance status. 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 She is working with her Estate manager/land agent and is using  the "Cancer Free with Food" diet. She is also working on Child psychotherapist. She has had a lot of stress in her life. She is due for an imaging study that Dr. Alvy Bimler will be scheduling.  She is interested in clinical trials and other therapies if she has progression.    Genetic: Negative genetic testing on a custom panel.  The Custom gene panel offered by [facility] includes sequencing and rearrangement analysis for the following 24 genes:  ATM, BAP1, BARD1, BRCA1, BRCA2, BRIP1, CDH1, CDK4, CDKN2A, CHEK2, EPCAM, FANCC, MITF, MLH1, MSH2, MSH6, NBN, PALB2, PMS2, PTEN, RAD51C, RAD51D, TP53, and XRCC2.   The report date is May 12, 2017    Problem List: Patient Active Problem List   Diagnosis Date Noted  . CKD (chronic kidney disease), stage III ([place]) 01/21/2021  . 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 Dyspnea 08/19/2020  . Weight loss, non-intentional 07/27/2020  . 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 Chemotherapy-induced nausea 05/11/2020  . Preventive measure 04/13/2020  . Elevated BP without diagnosis of hypertension 02/18/2019  . Pancytopenia, acquired ([place]) 10/15/2018  . Anemia due to antineoplastic chemotherapy 08/13/2018  . Goals of care, counseling/discussion 05/15/2018  . Encounter for antineoplastic chemotherapy 05/14/2018  . Vasovagal syncope 07/05/2016  . Syncope and collapse 06/28/2016  . Genetic testing 05/13/2016  . Portacath in place 04/01/2016  . History of basal cell cancer 04/01/2016  . History of melanoma 03/26/2016  . Port catheter in place 03/18/2016  . Poor venous access 03/11/2016  . History of migraine with aura 03/11/2016  . Diverticulosis of large intestine without hemorrhage 03/11/2016  . Elevated cholesterol 03/11/2016  . Fallopian tube cancer, carcinoma ([place]) 03/07/2016  . 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 Pelvic mass in female 02/18/2016  . Omental mass 02/08/2016  . Abdominal mass of other site 02/08/2016  . History of  ovarian cyst 07/08/2014  . H/O vitamin D deficiency 07/08/2014  . Osteopenia 07/08/2014  . Vaginal atrophy 07/08/2014    Past Medical History: Past Medical History:  Diagnosis Date  . Arthritis    arthritis -hands-knees.  . Cancer ([place])    MELANOMA. multiple x5 different sites. last 10 yrs ago.  Marland Kitchen Headache    past hx.-no in awhile.  . High cholesterol   . Melanoma ([place])   . Osteopenia   . Skin cancer    melanoma    Past Surgical History: Past Surgical History:  Procedure Laterality Date  . ABDOMINAL HYSTERECTOMY N/A 02/18/2016   Procedure: HYSTERECTOMY ABDOMINAL;  Surgeon: Everitt Amber, MD;  Location: [facility];  Service: Gynecology;  Laterality: N/A;  . DEBULKING N/A 02/18/2016   Procedure: RADICAL TUMOR DEBULKING;  Surgeon: Everitt Amber, MD;  Location: [facility];  Service: Gynecology;  Laterality: N/A;  . HYSTEROSCOPY    . LAPAROTOMY N/A 02/18/2016   Procedure: EXPLORATORY LAPAROTOMY;  Surgeon: Everitt Amber, MD;  Location: [facility];  Service: Gynecology;  Laterality: N/A;  . omental mass     Biopsy, CT guided  . OMENTECTOMY N/A 02/18/2016   Procedure: OMENTECTOMY;  Surgeon: Everitt Amber, MD;  Location: [facility];  Service: Gynecology;  Laterality: N/A;  . SALPINGOOPHORECTOMY Bilateral 02/18/2016   Procedure: BILATERAL SALPINGO OOPHORECTOMY;  Surgeon: Everitt Amber, MD;  Location: [facility];  Service: Gynecology;  Laterality: Bilateral;  . [place]    Past Gynecologic  History:  Per HPI  OB History:  OB History  Gravida Para Term Preterm AB Living  $Remov'3 2     1 2  'CAAAZZ$ SAB IAB Ectopic Multiple Live Births  1            # Outcome Date GA Lbr Len/2nd Weight Sex Delivery Anes PTL Lv  3 SAB           2 Para           1 Para             Family History: Family History  Problem Relation Age of Onset  . Colitis Sister   . Cancer Sister 29       ovarian ca  . Parkinsonism Maternal Grandmother   . Heart disease Paternal Grandmother   . Heart disease Paternal Grandfather   .  Cancer Cousin 12       paternal first cousin  . Skin cancer Son     Social History: Social History   Socioeconomic History  . Marital status: Married    Spouse name: Hinton Dyer  . Number of children: 2  . Years of education: Not on file  . Highest education level: Not on file  Occupational History  . Occupation: retired housewife  Tobacco Use  . Smoking status: Never Smoker  . Smokeless tobacco: Never Used  Vaping Use  . Vaping Use: Never used  Substance and Sexual Activity  . Alcohol use: Yes    Alcohol/week: 9.0 standard drinks    Types: 9 Glasses of wine per week    Comment: 9 GLASSES OF WINE A WEEK   . Drug use: No  . Sexual activity: Not Currently    Comment: 1ST INTERCOURSE- 21, PARTNERS- 1, MARRIED- 48 YRS   Other Topics Concern  . Not on file  Social History Narrative  . Not on file   Social Determinants of Health   Financial Resource Strain: Not on file  Food Insecurity: Not on file  Transportation Needs: Not on file  Physical Activity: Not on file  Stress: Not on file  Social Connections: Not on file  Intimate Partner Violence: Not on file    Allergies: No Known Allergies  Current Medications: Current Outpatient Medications  Medication Sig Dispense Refill  . Cholecalciferol (VITAMIN D) 2000 units CAPS Take 1 capsule by mouth daily.    Marland Kitchen ezetimibe (ZETIA) 10 MG tablet Take 10 mg by mouth daily.    Marland Kitchen lidocaine-prilocaine (EMLA) cream Apply to affected area once 30 g 3  . ondansetron (ZOFRAN) 8 MG tablet Take 1 tablet (8 mg total) by mouth 2 (two) times daily as needed (Nausea or vomiting). 30 tablet 1  . prochlorperazine (COMPAZINE) 10 MG tablet Take 1 tablet (10 mg total) by mouth every 6 (six) hours as needed (Nausea or vomiting). 60 tablet 1   No current facility-administered medications for this visit.   Review of Systems:  General: negative for fevers, changes in weight or night sweats Skin: negative for changes in moles or sores or rash Eyes:  negative for changes in vision HEENT: negative for change in hearing, tinnitus, voice changes Pulmonary: negative for dyspnea, orthopnea, productive cough, wheezing Cardiac: negative for palpitations, pain Gastrointestinal: negative for nausea, vomiting, constipation, diarrhea, hematemesis, hematochezia Genitourinary/Sexual: negative for dysuria, retention, hematuria, incontinence Ob/Gyn:  negative for abnormal bleeding, or pain Musculoskeletal: negative for pain, joint pain, back pain Hematology: negative for easy bruising, abnormal bleeding Neurologic/Psych: negative for headaches, seizures, paralysis, weakness, numbness  Objective:  Physical Examination:  BP 132/66   Pulse 73   Temp 98.3 F (36.8 C)   Resp 16   Wt 143 lb 9.6 oz (65.1 kg)   SpO2 100%   BMI 24.65 kg/m     ECOG Performance Status: 1 - Symptomatic but completely ambulatory  GENERAL: Patient is a well appearing female in no acute distress HEENT:  PERRL, neck supple with midline trachea. NODES:  No cervical, supraclavicular, axillary, or inguinal lymphadenopathy palpated.  LUNGS:  Clear to auscultation. Decreased breath sounds on right side. No wheezes or rhonchi. HEART:  Regular rate and rhythm. No murmur appreciated. ABDOMEN:  Soft, nontender.  Positive, normoactive bowel sounds.  MSK:  No focal spinal tenderness to palpation. Full range of motion bilaterally in the upper extremities. EXTREMITIES:  No peripheral edema.   SKIN:  Clear with no obvious rashes or skin changes.  NEURO:  Nonfocal. Well oriented.  Appropriate affect.  Pelvic: Chaperoned by CMA EGBUS: no lesions Cervix: absent Vagina: no lesions, no discharge or bleeding Uterus: absent BME: no palpable masses or nodularity Rectovaginal: deferred  Lab Review   Lab Results  Component Value Date   WBC 3.3 (L) 01/21/2021   HGB 8.9 (L) 01/21/2021   HCT 25.9 (L) 01/21/2021   MCV 94.2 01/21/2021   PLT 208 01/21/2021     Chemistry       Component Value Date/Time   NA 135 01/21/2021 1223   NA 139 06/07/2017 1430   K 4.1 01/21/2021 1223   K 3.8 06/07/2017 1430   CL 101 01/21/2021 1223   CO2 23 01/21/2021 1223   CO2 27 06/07/2017 1430   BUN 24 (H) 01/21/2021 1223   BUN 14.0 06/07/2017 1430   CREATININE 1.03 (H) 01/21/2021 1223   CREATININE 0.8 06/07/2017 1430      Component Value Date/Time   CALCIUM 8.6 (L) 01/21/2021 1223   CALCIUM 9.3 06/07/2017 1430   ALKPHOS 106 01/21/2021 1223   ALKPHOS 77 09/30/2016 1027   AST 14 (L) 01/21/2021 1223   AST 18 09/30/2016 1027   ALT 13 01/21/2021 1223   ALT 21 09/30/2016 1027   BILITOT <0.2 (L) 01/21/2021 1223   BILITOT 0.37 09/30/2016 1027       Radiologic Imaging: As per HPI    Assessment:  Nikka Hakimian is a 80 y.o. female diagnosed with platinum resistant recurrent Fallopian tube cancer, heavily pretreated. CA125 and imaging pending to determine response status.   Excellent performance status.   Anemia and leukopenia, asymptomatic  Slightly elevated creatinine due to chronic kidney disease.   Medical co-morbidities complicating care: chronic kidney disease, History of basal cell cancer, History of melanoma  Plan:   Problem List Items Addressed This Visit      Genitourinary   Fallopian tube cancer, carcinoma ([place]) - Primary      We discussed options for management. I recommended continuing with topotecan with Dr. Alvy Bimler. If she has progressive disease she may be eligible for clinical trials. We can also request tumor testing to determine if she is a candidate for the [place] capi trial. I recommended that she see [facility]. [facility] at [facility] for evaluation of phase I trials. She does have some medical issues that may limit her eligibility.   I contacted Dr. Unknown Foley who will arrange the appointment.    The patient's diagnosis, an outline of the further diagnostic and laboratory studies which will be required, the recommendation for surgery, and alternatives were  discussed with her and  her accompanying family members.  All questions were answered to their satisfaction.  A total of 80 minutes were spent with the patient/family today; >50% was spent in education, counseling and coordination of care for platinum resistant recurrent ovarian cancer.   Verlon Au, NP  I personally had a face to face interaction and evaluated the patient jointly with the NP, Ms. Beckey Rutter.  I have reviewed her history and available records and have performed the key portions of the physical exam including lymph node survey, abdominal exam, pelvic exam with my findings confirming those documented above by the APP.  I have discussed the case with the APP and the patient.  I agree with the above documentation, assessment and plan which was fully formulated by me.  Counseling was completed by me.   I personally saw the patient and performed a substantive portion of this encounter in conjunction with the listed APP as documented above.  I called Dr. Calton Dach office. She was out of office but I was able to leave a message on the nurses line for them to contact me.   Ang[place] Gaetana Michaelis, MD

## 2021-02-01 DIAGNOSIS — C561 Malignant neoplasm of right ovary: Secondary | ICD-10-CM | POA: Diagnosis not present

## 2021-02-01 DIAGNOSIS — C5702 Malignant neoplasm of left fallopian tube: Secondary | ICD-10-CM | POA: Diagnosis not present

## 2021-02-01 DIAGNOSIS — Z79899 Other long term (current) drug therapy: Secondary | ICD-10-CM | POA: Diagnosis not present

## 2021-02-01 DIAGNOSIS — T451X5D Adverse effect of antineoplastic and immunosuppressive drugs, subsequent encounter: Secondary | ICD-10-CM | POA: Diagnosis not present

## 2021-02-01 DIAGNOSIS — C787 Secondary malignant neoplasm of liver and intrahepatic bile duct: Secondary | ICD-10-CM | POA: Diagnosis not present

## 2021-02-01 DIAGNOSIS — T451X5A Adverse effect of antineoplastic and immunosuppressive drugs, initial encounter: Secondary | ICD-10-CM | POA: Diagnosis not present

## 2021-02-01 DIAGNOSIS — C5700 Malignant neoplasm of unspecified fallopian tube: Secondary | ICD-10-CM | POA: Diagnosis not present

## 2021-02-01 DIAGNOSIS — C786 Secondary malignant neoplasm of retroperitoneum and peritoneum: Secondary | ICD-10-CM | POA: Diagnosis not present

## 2021-02-01 DIAGNOSIS — Z85820 Personal history of malignant melanoma of skin: Secondary | ICD-10-CM | POA: Diagnosis not present

## 2021-02-01 DIAGNOSIS — D6481 Anemia due to antineoplastic chemotherapy: Secondary | ICD-10-CM | POA: Diagnosis not present

## 2021-02-01 DIAGNOSIS — Z86018 Personal history of other benign neoplasm: Secondary | ICD-10-CM | POA: Diagnosis not present

## 2021-02-01 DIAGNOSIS — R030 Elevated blood-pressure reading, without diagnosis of hypertension: Secondary | ICD-10-CM | POA: Diagnosis not present

## 2021-02-01 DIAGNOSIS — N183 Chronic kidney disease, stage 3 unspecified: Secondary | ICD-10-CM | POA: Diagnosis not present

## 2021-02-01 DIAGNOSIS — E785 Hyperlipidemia, unspecified: Secondary | ICD-10-CM | POA: Diagnosis not present

## 2021-02-03 DIAGNOSIS — C786 Secondary malignant neoplasm of retroperitoneum and peritoneum: Secondary | ICD-10-CM | POA: Diagnosis not present

## 2021-02-03 DIAGNOSIS — C5700 Malignant neoplasm of unspecified fallopian tube: Secondary | ICD-10-CM | POA: Diagnosis not present

## 2021-02-03 DIAGNOSIS — C481 Malignant neoplasm of specified parts of peritoneum: Secondary | ICD-10-CM | POA: Diagnosis not present

## 2021-02-04 ENCOUNTER — Inpatient Hospital Stay: Payer: Medicare Other

## 2021-02-04 ENCOUNTER — Other Ambulatory Visit: Payer: Self-pay

## 2021-02-04 ENCOUNTER — Other Ambulatory Visit: Payer: Medicare Other

## 2021-02-04 VITALS — BP 133/72 | HR 69 | Temp 98.2°F | Resp 17 | Wt 144.0 lb

## 2021-02-04 DIAGNOSIS — Z7189 Other specified counseling: Secondary | ICD-10-CM

## 2021-02-04 DIAGNOSIS — E538 Deficiency of other specified B group vitamins: Secondary | ICD-10-CM | POA: Diagnosis not present

## 2021-02-04 DIAGNOSIS — Z95828 Presence of other vascular implants and grafts: Secondary | ICD-10-CM

## 2021-02-04 DIAGNOSIS — C5700 Malignant neoplasm of unspecified fallopian tube: Secondary | ICD-10-CM | POA: Diagnosis not present

## 2021-02-04 DIAGNOSIS — Z5111 Encounter for antineoplastic chemotherapy: Secondary | ICD-10-CM | POA: Diagnosis not present

## 2021-02-04 DIAGNOSIS — Z90710 Acquired absence of both cervix and uterus: Secondary | ICD-10-CM | POA: Diagnosis not present

## 2021-02-04 DIAGNOSIS — C5702 Malignant neoplasm of left fallopian tube: Secondary | ICD-10-CM

## 2021-02-04 DIAGNOSIS — D61818 Other pancytopenia: Secondary | ICD-10-CM | POA: Diagnosis not present

## 2021-02-04 DIAGNOSIS — N183 Chronic kidney disease, stage 3 unspecified: Secondary | ICD-10-CM | POA: Diagnosis not present

## 2021-02-04 DIAGNOSIS — D649 Anemia, unspecified: Secondary | ICD-10-CM

## 2021-02-04 LAB — CBC WITH DIFFERENTIAL (CANCER CENTER ONLY)
Abs Immature Granulocytes: 0.01 10*3/uL (ref 0.00–0.07)
Basophils Absolute: 0 10*3/uL (ref 0.0–0.1)
Basophils Relative: 0 %
Eosinophils Absolute: 0.1 10*3/uL (ref 0.0–0.5)
Eosinophils Relative: 4 %
HCT: 27.1 % — ABNORMAL LOW (ref 36.0–46.0)
Hemoglobin: 8.6 g/dL — ABNORMAL LOW (ref 12.0–15.0)
Immature Granulocytes: 0 %
Lymphocytes Relative: 20 %
Lymphs Abs: 0.6 10*3/uL — ABNORMAL LOW (ref 0.7–4.0)
MCH: 31.2 pg (ref 26.0–34.0)
MCHC: 31.7 g/dL (ref 30.0–36.0)
MCV: 98.2 fL (ref 80.0–100.0)
Monocytes Absolute: 0.4 10*3/uL (ref 0.1–1.0)
Monocytes Relative: 13 %
Neutro Abs: 1.7 10*3/uL (ref 1.7–7.7)
Neutrophils Relative %: 63 %
Platelet Count: 198 10*3/uL (ref 150–400)
RBC: 2.76 MIL/uL — ABNORMAL LOW (ref 3.87–5.11)
RDW: 14.3 % (ref 11.5–15.5)
WBC Count: 2.7 10*3/uL — ABNORMAL LOW (ref 4.0–10.5)
nRBC: 0 % (ref 0.0–0.2)

## 2021-02-04 LAB — CMP (CANCER CENTER ONLY)
ALT: 13 U/L (ref 0–44)
AST: 16 U/L (ref 15–41)
Albumin: 3.6 g/dL (ref 3.5–5.0)
Alkaline Phosphatase: 101 U/L (ref 38–126)
Anion gap: 11 (ref 5–15)
BUN: 19 mg/dL (ref 8–23)
CO2: 22 mmol/L (ref 22–32)
Calcium: 8.8 mg/dL — ABNORMAL LOW (ref 8.9–10.3)
Chloride: 106 mmol/L (ref 98–111)
Creatinine: 0.9 mg/dL (ref 0.44–1.00)
GFR, Estimated: >60 mL/min (ref >60)
Glucose, Bld: 109 mg/dL — ABNORMAL HIGH (ref 70–99)
Potassium: 4.4 mmol/L (ref 3.5–5.1)
Sodium: 139 mmol/L (ref 135–145)
Total Bilirubin: 0.3 mg/dL (ref 0.3–1.2)
Total Protein: 6.4 g/dL — ABNORMAL LOW (ref 6.5–8.1)

## 2021-02-04 MED ORDER — HEPARIN SOD (PORK) LOCK FLUSH 100 UNIT/ML IV SOLN
500.0000 [IU] | Freq: Once | INTRAVENOUS | Status: DC | PRN
  Filled 2021-02-04: qty 5

## 2021-02-04 MED ORDER — PROCHLORPERAZINE MALEATE 10 MG PO TABS
10.0000 mg | ORAL_TABLET | Freq: Once | ORAL | Status: AC
  Administered 2021-02-04: 10 mg via ORAL

## 2021-02-04 MED ORDER — SODIUM CHLORIDE 0.9 % IV SOLN
Freq: Once | INTRAVENOUS | Status: AC
Start: 2021-02-04 — End: 2021-02-04
  Filled 2021-02-04: qty 250

## 2021-02-04 MED ORDER — CYANOCOBALAMIN 1000 MCG/ML IJ SOLN
INTRAMUSCULAR | Status: AC
  Filled 2021-02-04: qty 1

## 2021-02-04 MED ORDER — SODIUM CHLORIDE 0.9% FLUSH
10.0000 mL | INTRAVENOUS | Status: DC | PRN
  Filled 2021-02-04: qty 10

## 2021-02-04 MED ORDER — TOPOTECAN HCL CHEMO INJECTION 4 MG
2.3000 mg/m2 | Freq: Once | INTRAVENOUS | Status: AC
  Administered 2021-02-04: 4 mg via INTRAVENOUS
  Filled 2021-02-04: qty 4

## 2021-02-04 MED ORDER — CYANOCOBALAMIN 1000 MCG/ML IJ SOLN
1000.0000 ug | Freq: Once | INTRAMUSCULAR | Status: AC
Start: 2021-02-04 — End: 2021-02-04
  Administered 2021-02-04: 1000 ug via INTRAMUSCULAR

## 2021-02-04 MED ORDER — PROCHLORPERAZINE MALEATE 10 MG PO TABS
ORAL_TABLET | ORAL | Status: AC
  Filled 2021-02-04: qty 1

## 2021-02-04 NOTE — Patient Instructions (Signed)
Central Line, Adult A central line is a long, thin tube (catheter) that is put into a vein so that it goes to a large vein above your heart. It can be used to:  Give you medicine or fluids.  Give you food and nutrients.  Take blood or give you blood for testing or treatments. Types of central lines There are four main types of central lines:  Peripherally inserted central catheter (PICC) line. This type is usually put in the upper arm and goes up the arm to the heart.  Tunneled central line. This type is placed in a large vein in the neck, chest, or groin. It is tunneled under the skin and brought out through a second incision.  Non-tunneled central line. This type is used for a shorter time than other types, usually for 7 days at the most. It is inserted in the neck, chest, or groin.  Implanted port. This type can stay in place longer than other types of central lines. It is normally put in the upper chest but can also be placed in the upper arm or the belly. Surgery is needed to put it in and take it out. The type of central line you get will depend on how long you need it and your medical condition.   Tell a doctor about:  Any allergies you have.  All medicines you are taking. These include vitamins, herbs, eye drops, creams, and over-the-counter medicines.  Any problems you or family members have had with anesthetic medicines.  Any blood disorders you have.  Any surgeries you have had.  Any medical conditions you have.  Whether you are pregnant or may be pregnant. What are the risks? Generally, central lines are safe. However, problems may occur, including:  Infection.  A blood clot.  Bleeding from the place where the central line was inserted.  Getting a hole or crack in the central line. If this happens, the central line will need to be replaced.  Central line failure.  The catheter moving or coming out of place. What happens before the  procedure? Medicines  Ask your doctor about changing or stopping: ? Your normal medicines. ? Vitamins, herbs, and supplements. ? Over-the-counter medicines.  Do not take aspirin or ibuprofen unless you are told to. General instructions  Follow instructions from your doctor about eating or drinking.  For your safety, your doctor may: ? Mark the area of the procedure. ? Remove hair at the procedure site. ? Ask you to wash with a soap that kills germs.  Plan to have a responsible adult take you home from the hospital or clinic.  If you will be going home right after the procedure, plan to have a responsible adult care for you for the time you are told. This is important. What happens during the procedure?  An IV tube will be put into one of your veins.  You may be given: ? A sedative. This medicine helps you relax. ? Anesthetics. These medicines numb certain areas of your body.  Your skin will be cleaned with a germ-killing (antiseptic) solution. You may be covered with clean drapes.  Your blood pressure, heart rate, breathing rate, and blood oxygen level will be monitored during the procedure.  The central line will be put into the vein and moved through it to the correct spot. The doctor may use X-ray equipment to help guide the central line to the right place.  A bandage (dressing) will be placed over the insertion area.   The procedure may vary among doctors and hospitals. What can I expect after the procedure?  You will be monitored until you leave the hospital or clinic. This includes checking your blood pressure, heart rate, breathing rate, and blood oxygen level.  Caps may be placed on the ends of the central line tubing.  If you were given a sedative during your procedure, do not drive or use machines until your doctor says that it is safe. Follow these instructions at home: Caring for the tube  Follow instructions from your doctor about: ? Flushing the  tube. ? Cleaning the tube and the area around it.  Only use germ-free (sterile) supplies to flush. The supplies should be from your doctor, a pharmacy, or another place that your doctor recommends.  Before you flush the tube or clean the area around the tube: ? Wash your hands with soap and water for at least 20 seconds. If you cannot use soap and water, use hand sanitizer. ? Clean the central line hub with rubbing alcohol. To do this:  Scrub it using a twisting motion and rub for 10 to 15 seconds or for 30 twists. Follow the manufacturer's instructions.  Be sure you scrub the top of the hub, not just the sides. Never reuse alcohol pads.  Let the hub dry before use. Keep it from touching anything while drying.   Caring for your skin  Check the skin around the central line every day for signs of infection. Check for: ? Redness, swelling, or pain. ? Fluid or blood. ? Warmth. ? Pus or a bad smell.  Keep the area where the tube was put in clean and dry.  Change bandages only as told by your doctor.  Keep your bandage dry. If a bandage gets wet, have it changed right away. General instructions  Keep the tube clamped, unless it is being used.  If you or someone else accidentally pulls on the tube, make sure: ? The bandage is okay. ? There is no bleeding. ? The tube has not been pulled out.  Do not use scissors or sharp objects near the tube.  Do not take baths, swim, or use a hot tub until your doctor says it is okay. Ask your doctor if you may take showers. You may only be allowed to take sponge baths.  Ask your doctor what activities are safe for you. Your doctor may tell you not to lift anything or move your arm too much.  Take over-the-counter and prescription medicines only as told by your doctor.  Keep all follow-up visits. Storing and throwing away supplies  Keep your supplies in a clean, dry location.  Throw away any used syringes in a container that is only for  sharp items (sharps container). You can buy a sharps container from a pharmacy, or you can make one by using an empty hard plastic bottle with a cover.  Place any used bandages or infusion bags into a plastic bag. Throw that bag in the trash. Contact a doctor if:  You have any of these signs of infection where the tube was put in: ? Redness, swelling, or pain. ? Fluid or blood. ? Warmth. ? Pus or a bad smell. Get help right away if:  You have: ? A fever or chills. ? Shortness of breath. ? Pain in your chest. ? A fast heartbeat. ? Swelling in your neck, face, chest, or arm.  You feel dizzy or you faint.  There are red lines coming from   where the tube was put in.  The area where the tube was put in is bleeding and the bleeding will not stop.  Your tube is hard to flush.  You do not get a blood return from the tube.  The tube gets loose or comes out.  The tube has a hole or a tear.  The tube leaks. Summary  A central line is a long, thin tube (catheter) that is put in your vein. It can be used to give you medicine, food, or fluids.  Follow instructions from your doctor about flushing and cleaning the tube.  Keep the area where the tube was put in clean and dry.  Ask your doctor what activities are safe for you. This information is not intended to replace advice given to you by your health care provider. Make sure you discuss any questions you have with your health care provider. Document Revised: 06/04/2020 Document Reviewed: 06/04/2020 Elsevier Patient Education  2021 [employer]  

## 2021-02-04 NOTE — Patient Instructions (Signed)
[facility] Discharge Instructions for Patients Receiving Chemotherapy  Today you received the following chemotherapy agents: topotecan.  To help prevent nausea and vomiting after your treatment, we encourage you to take your nausea medication as directed.   If you develop nausea and vomiting that is not controlled by your nausea medication, call the clinic.   BELOW ARE SYMPTOMS THAT SHOULD BE REPORTED IMMEDIATELY:  *FEVER GREATER THAN 100.5 F  *CHILLS WITH OR WITHOUT FEVER  NAUSEA AND VOMITING THAT IS NOT CONTROLLED WITH YOUR NAUSEA MEDICATION  *UNUSUAL SHORTNESS OF BREATH  *UNUSUAL BRUISING OR BLEEDING  TENDERNESS IN MOUTH AND THROAT WITH OR WITHOUT PRESENCE OF ULCERS  *URINARY PROBLEMS  *BOWEL PROBLEMS  UNUSUAL RASH Items with * indicate a potential emergency and should be followed up as soon as possible.  Feel free to call the clinic should you have any questions or concerns. The clinic phone number is [phone].  Please show the [facility] at check-in to the Emergency Department and triage nurse.

## 2021-02-10 ENCOUNTER — Other Ambulatory Visit: Payer: Self-pay | Admitting: Hematology and Oncology

## 2021-02-10 DIAGNOSIS — C5702 Malignant neoplasm of left fallopian tube: Secondary | ICD-10-CM

## 2021-02-10 DIAGNOSIS — Z7189 Other specified counseling: Secondary | ICD-10-CM

## 2021-02-11 ENCOUNTER — Inpatient Hospital Stay: Payer: Medicare Other

## 2021-02-11 ENCOUNTER — Other Ambulatory Visit: Payer: Self-pay

## 2021-02-11 ENCOUNTER — Other Ambulatory Visit: Payer: Medicare Other

## 2021-02-11 ENCOUNTER — Encounter: Payer: Self-pay | Admitting: Hematology and Oncology

## 2021-02-11 ENCOUNTER — Inpatient Hospital Stay (HOSPITAL_BASED_OUTPATIENT_CLINIC_OR_DEPARTMENT_OTHER): Payer: Medicare Other | Admitting: Hematology and Oncology

## 2021-02-11 VITALS — BP 132/67 | HR 76 | Temp 97.4°F | Resp 18 | Ht 64.0 in | Wt 143.8 lb

## 2021-02-11 DIAGNOSIS — Z95828 Presence of other vascular implants and grafts: Secondary | ICD-10-CM

## 2021-02-11 DIAGNOSIS — C786 Secondary malignant neoplasm of retroperitoneum and peritoneum: Secondary | ICD-10-CM | POA: Diagnosis not present

## 2021-02-11 DIAGNOSIS — E538 Deficiency of other specified B group vitamins: Secondary | ICD-10-CM | POA: Diagnosis not present

## 2021-02-11 DIAGNOSIS — C5702 Malignant neoplasm of left fallopian tube: Secondary | ICD-10-CM

## 2021-02-11 DIAGNOSIS — D61818 Other pancytopenia: Secondary | ICD-10-CM | POA: Diagnosis not present

## 2021-02-11 DIAGNOSIS — C762 Malignant neoplasm of abdomen: Secondary | ICD-10-CM

## 2021-02-11 DIAGNOSIS — Z7189 Other specified counseling: Secondary | ICD-10-CM

## 2021-02-11 DIAGNOSIS — Z5111 Encounter for antineoplastic chemotherapy: Secondary | ICD-10-CM | POA: Diagnosis not present

## 2021-02-11 DIAGNOSIS — C5700 Malignant neoplasm of unspecified fallopian tube: Secondary | ICD-10-CM

## 2021-02-11 DIAGNOSIS — Z90710 Acquired absence of both cervix and uterus: Secondary | ICD-10-CM | POA: Diagnosis not present

## 2021-02-11 DIAGNOSIS — N183 Chronic kidney disease, stage 3 unspecified: Secondary | ICD-10-CM

## 2021-02-11 LAB — CBC WITH DIFFERENTIAL (CANCER CENTER ONLY)
Abs Immature Granulocytes: 0.04 10*3/uL (ref 0.00–0.07)
Basophils Absolute: 0 10*3/uL (ref 0.0–0.1)
Basophils Relative: 1 %
Eosinophils Absolute: 0.1 10*3/uL (ref 0.0–0.5)
Eosinophils Relative: 2 %
HCT: 24.6 % — ABNORMAL LOW (ref 36.0–46.0)
Hemoglobin: 8.2 g/dL — ABNORMAL LOW (ref 12.0–15.0)
Immature Granulocytes: 1 %
Lymphocytes Relative: 34 %
Lymphs Abs: 1 10*3/uL (ref 0.7–4.0)
MCH: 31.4 pg (ref 26.0–34.0)
MCHC: 33.3 g/dL (ref 30.0–36.0)
MCV: 94.3 fL (ref 80.0–100.0)
Monocytes Absolute: 0.4 10*3/uL (ref 0.1–1.0)
Monocytes Relative: 13 %
Neutro Abs: 1.5 10*3/uL — ABNORMAL LOW (ref 1.7–7.7)
Neutrophils Relative %: 49 %
Platelet Count: 224 10*3/uL (ref 150–400)
RBC: 2.61 MIL/uL — ABNORMAL LOW (ref 3.87–5.11)
RDW: 13.9 % (ref 11.5–15.5)
WBC Count: 3 10*3/uL — ABNORMAL LOW (ref 4.0–10.5)
nRBC: 0 % (ref 0.0–0.2)

## 2021-02-11 LAB — CMP (CANCER CENTER ONLY)
ALT: 13 U/L (ref 0–44)
AST: 17 U/L (ref 15–41)
Albumin: 3.8 g/dL (ref 3.5–5.0)
Alkaline Phosphatase: 103 U/L (ref 38–126)
Anion gap: 8 (ref 5–15)
BUN: 18 mg/dL (ref 8–23)
CO2: 24 mmol/L (ref 22–32)
Calcium: 9.1 mg/dL (ref 8.9–10.3)
Chloride: 105 mmol/L (ref 98–111)
Creatinine: 0.99 mg/dL (ref 0.44–1.00)
GFR, Estimated: 58 mL/min — ABNORMAL LOW (ref >60)
Glucose, Bld: 125 mg/dL — ABNORMAL HIGH (ref 70–99)
Potassium: 4.5 mmol/L (ref 3.5–5.1)
Sodium: 137 mmol/L (ref 135–145)
Total Bilirubin: 0.2 mg/dL — ABNORMAL LOW (ref 0.3–1.2)
Total Protein: 6.6 g/dL (ref 6.5–8.1)

## 2021-02-11 LAB — SAMPLE TO BLOOD BANK

## 2021-02-11 MED ORDER — SODIUM CHLORIDE 0.9% FLUSH
10.0000 mL | Freq: Once | INTRAVENOUS | Status: AC
  Administered 2021-02-11: 10 mL
  Filled 2021-02-11: qty 10

## 2021-02-11 MED ORDER — SODIUM CHLORIDE 0.9 % IV SOLN
Freq: Once | INTRAVENOUS | Status: AC
  Filled 2021-02-11: qty 250

## 2021-02-11 MED ORDER — PROCHLORPERAZINE MALEATE 10 MG PO TABS
10.0000 mg | ORAL_TABLET | Freq: Once | ORAL | Status: AC
  Administered 2021-02-11: 10 mg via ORAL

## 2021-02-11 MED ORDER — CYANOCOBALAMIN 1000 MCG/ML IJ SOLN
1000.0000 ug | Freq: Once | INTRAMUSCULAR | Status: AC
  Administered 2021-02-11: 1000 ug via INTRAMUSCULAR

## 2021-02-11 MED ORDER — PROCHLORPERAZINE MALEATE 10 MG PO TABS
ORAL_TABLET | ORAL | Status: AC
  Filled 2021-02-11: qty 1

## 2021-02-11 MED ORDER — TOPOTECAN HCL CHEMO INJECTION 4 MG
2.3000 mg/m2 | Freq: Once | INTRAVENOUS | Status: AC
  Administered 2021-02-11: 4 mg via INTRAVENOUS
  Filled 2021-02-11: qty 4

## 2021-02-11 MED ORDER — CYANOCOBALAMIN 1000 MCG/ML IJ SOLN
INTRAMUSCULAR | Status: AC
  Filled 2021-02-11: qty 1

## 2021-02-11 NOTE — Assessment & Plan Note (Signed)
She has occasional intermittent elevated serum creatinine Observe closely for now This could contribute to anemia as well

## 2021-02-11 NOTE — Assessment & Plan Note (Signed)
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

## 2021-02-11 NOTE — Patient Instructions (Signed)
[facility]COL[facility]  Discharge Instructions: Thank you for choosing [facility] to provide your oncology and hematology care.   If you have a lab appointment with the [facility], please go directly to the [facility] and check in at the registration area.   Wear comfortable clothing and clothing appropriate for easy access to any Portacath or PICC line.   We strive to give you quality time with your provider. You may need to reschedule your appointment if you arrive late (15 or more minutes).  Arriving late affects you and other patients whose appointments are after yours.  Also, if you miss three or more appointments without notifying the office, you may be dismissed from the clinic at the provider's discretion.      For prescription refill requests, have your pharmacy contact our office and allow 72 hours for refills to be completed.    Today you received the following chemotherapy and/or immunotherapy agents:  Topotecan (Hycamtin)    To help prevent nausea and vomiting after your treatment, we encourage you to take your nausea medication as directed.  BELOW ARE SYMPTOMS THAT SHOULD BE REPORTED IMMEDIATELY: . *FEVER GREATER THAN 100.4 F (38 C) OR HIGHER . *CHILLS OR SWEATING . *NAUSEA AND VOMITING THAT IS NOT CONTROLLED WITH YOUR NAUSEA MEDICATION . *UNUSUAL SHORTNESS OF BREATH . *UNUSUAL BRUISING OR BLEEDING . *URINARY PROBLEMS (pain or burning when urinating, or frequent urination) . *BOWEL PROBLEMS (unusual diarrhea, constipation, pain near the anus) . TENDERNESS IN MOUTH AND THROAT WITH OR WITHOUT PRESENCE OF ULCERS (sore throat, sores in mouth, or a toothache) . UNUSUAL RASH, SWELLING OR PAIN  . UNUSUAL VAGINAL DISCHARGE OR ITCHING   Items with * indicate a potential emergency and should be followed up as soon as possible or go to the Emergency Department if any problems should occur.  Please show the CHEMOTHERAPY ALERT CARD or  IMMUNOTHERAPY ALERT CARD at check-in to the Emergency Department and triage nurse.  Should you have questions after your visit or need to cancel or reschedule your appointment, please contact [facility]  Dept: [phone]  and follow the prompts.  Office hours are 8:00 a.m. to 4:30 p.m. Monday - Friday. Please note that voicemails left after 4:00 p.m. may not be returned until the following business day.  We are closed weekends and major holidays. You have access to a nurse at all times for urgent questions. Please call the main number to the clinic Dept: [phone] and follow the prompts.   For any non-urgent questions, you may also contact your provider using MyChart. We now offer e-Visits for anyone 61 and older to request care online for non-urgent symptoms. For details visit [email or web address].   Also download the MyChart app! Go to the app store, search "MyChart", open the app, select [facility], and log in with your MyChart username and password.  Due to Covid, a mask is required upon entering the hospital/clinic. If you do not have a mask, one will be given to you upon arrival. For doctor visits, patients may have 1 support person aged 49 or older with them. For treatment visits, patients cannot have anyone with them due to current Covid guidelines and our immunocompromised population.

## 2021-02-11 NOTE — Progress Notes (Signed)
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 CT ABDOMEN PELVIS W CONTRAST    Standing Status:   Future    Standing Expiration Date:   02/11/2022    Order Specific Question:   If indicated for the ordered procedure, I authorize the administration of contrast media per Radiology protocol    Answer:   Yes    Order Specific Question:   Preferred imaging location?    Answer:   [facility]    Order Specific Question:   Radiology Contrast Protocol - do NOT remove file path    Answer:   [email or web address]    All questions were answered. The patient knows to call the clinic with any problems, questions or concerns. The total time spent in the appointment was 20 minutes encounter with patients including review of chart and various tests results, discussions about plan of care and coordination of care plan   [name], MD 02/11/2021 1:52 PM  INTERVAL HISTORY: Please see below for problem oriented  charting. She returns for further follow-up She is feeling well No nausea She denies signs or symptoms of anemia such as fatigue, dizziness or shortness of breath  The patient denies any recent signs or symptoms of bleeding such as spontaneous epistaxis, hematuria or hematochezia.   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 Extensive omental nodularity highly worrisome for peritoneal carcinomatosis. Late recurrence of melanoma can present as peritoneal carcinomatosis. Ovarian cancer more commonly presents in this manner. Patient does have a predominately low density right adnexal lesion measuring up to 3.5 cm, although this lesion is not typical for ovarian cancer. 2. No evidence of bowel or ureteral obstruction. 3. No other definite signs of metastatic disease. There are small indeterminate low-density hepatic lesions.   02/08/2016 Tumor Marker   Patient's tumor was tested for the following markers: CA-125 Results of the tumor marker test revealed 656.2   02/15/2016 Procedure   CT-guided core biopsy performed of omental mass just deep to the abdominal wall.   02/15/2016 Pathology Results   Omentum, biopsy, left - PAPILLARY SEROUS NEOPLASM, SEE COMMENT. Microscopic Comment There are papillary collections of largely low grade appearing cells with numerous psammoma bodies. Given the  limited material it is difficult to distinguish between invasive implants of a serous borderline tumor and serous carcinoma, especially given the low grade appearance.    02/18/2016 Pathology Results   1. Omentum, resection for tumor INVASIVE IMPLANT OF HIGH GRADE SEROUS CARCINOMA 2. Uterus +/- tubes/ovaries, neoplastic HIGH GRADE SEROUS CARCINOMA INVOLVING LEFT TUBAL FIMBRIA SEROUS CARCINOMA WITH PREDOMINANT PSAMMOMA BODIES IMPLANT AT UTERUS SEROSA, BILATERAL FALLOPIAN TUBAL SEROSA, AND ANTERIOR PERITONEAL REFLECTION CERVIX: HISTOLOGICAL UNREMARKABLE ENDOMETRIUM: INACTIVE ENDOMETRIUM MYOMETRIUM: LEIOMYOMA LEFT OVARY: CYSTADENOFIBROMA RIGHT OVARY AND FALLOPIAN TUBE: HISTOLOGICAL UNREMARKABLE Microscopic Comment 2. ONCOLOGY TABLE - FALLOPIAN TUBE 1. Specimen, including laterality: Omentum, uterus, bilateral ovaries and fallopian tubes 2. Procedure: Hysterectomy, bilateral salpingo-oophorectomy and tumor debulking omentectomy 3. Lymph node sampling performed: No 4. Tumor site: uterus serosa, bilateral fallopian tubal serosa, peritoneal and omentum 5. Tumor location in fallopian tube: Left fallopian tubal fimbria 6. Specimen integrity (intact/ruptured/disrupted): Intact 7. Tumor size (cm): multi focal invasive omentum implant greater than 2 cm, left fallopian tube tumor 0.8 cm. 8. Histologic type: Serous carcinoma 9. Grade: 3 10. Microscopic tumor extension: uterus serosa, bilateral fallopian tubal serosa, peritoneal and omentum 11. Margins: NA 12. Lymph-Vascular invasion: identified 13. Lymph nodes: # examined: 0; # positive: NA 14. TNM: pT3c, pNx 15. FIGO Stage (based on pathologic findings, needs clinical correlation: IIIC 16. Comment: High grade serous carcinoma multifocally and extensively involves left fallopian tubal fimbria, omentum, uterus serosa, bilateral fallopian tubal serosa and peritoneal. At the left fallopian tube there are small foci of serous tubal intraepithelial carcinoma identified, so we conclude the carcinoma is fallopian tube origin   03/09/2016 Tumor Marker   Patient's tumor was tested for the following markers: CA-125 Results of  the tumor marker test revealed 154.4   03/15/2016 Procedure   Technically successful right IJ power-injectable port catheter placement. Ready for routine use.   03/18/2016 - 07/13/2016 Chemotherapy   The patient had 6 cycles of carboplatin and taxol   04/14/2016 Tumor Marker   Patient's tumor was tested for the following markers: CA-125 Results of the tumor marker test revealed 36.7   04/25/2016 Genetic Testing   Patient has genetic testing done for germline mutation Results revealed patient has no mutation   04/28/2016 Tumor Marker   Patient's tumor was tested for the following markers: CA-125 Results of the tumor marker test revealed 24.6   06/21/2016 Tumor Marker   Patient's tumor was tested for the following markers: CA-125 Results of the tumor marker test revealed 16.6   08/01/2016 Tumor Marker   Patient's tumor was tested for the following markers: CA-125 Results of the tumor marker test revealed 17.2   08/05/2016 Imaging   Interval TAH-BSO. No evidence of residual pelvic mass or metastatic disease within the abdomen or pelvis. No other acute findings.    11/04/2016 Tumor Marker   Patient's tumor was tested for the following markers: CA-125 Results of the tumor marker test revealed 13.6   01/20/2017 Tumor Marker   Patient's tumor was tested for the following markers: CA-125 Results of the tumor marker test revealed 13.8   04/14/2017 Tumor Marker   Patient's tumor was tested for the following markers: CA-125 Results of the tumor marker test revealed 16.1   06/13/2017 Imaging   No acute findings.  No mass or hernia identified.  Colonic diverticulosis, without radiographic evidence of diverticulitis.  Aortic atherosclerosis.   07/03/2017 Tumor Marker   Patient's tumor was tested for the following markers: CA-125 Results of the tumor marker test revealed 18.4  09/19/2017 Tumor Marker   Patient's tumor was tested for the following markers: CA-125 Results of the tumor  marker test revealed 18.5   01/23/2018 Tumor Marker   Patient's tumor was tested for the following markers: CA-125 Results of the tumor marker test revealed 35.4   01/30/2018 Imaging   1. No evidence of local fallopian tube carcinoma recurrence within the pelvis. Post hysterectomy. 2. No evidence of metastatic peritoneal disease or omental disease. No solid organ metastasis.    03/20/2018 Tumor Marker   Patient's tumor was tested for the following markers: CA-125 Results of the tumor marker test revealed 73   05/02/2018 Tumor Marker   Patient's tumor was tested for the following markers: CA-125 Results of the tumor marker test revealed 127.1   05/08/2018 Imaging   CT abdomen and pelvis 1. 7 cm left subdiaphragmatic collection of loculated fluid versus low-density soft tissue. Given the patient's history of rising CA 125, metastatic disease considered highly likely.  2. Areas of fluid or recurrent disease identified in the right lower quadrant adjacent to the cecum and along right lower quadrant small bowel loops.   05/14/2018 Tumor Marker   Patient's tumor was tested for the following markers: CA-125 Results of the tumor marker test revealed 166    Genetic Testing   Patient has genetic testing done for ER/PR and MMR. Results revealed patient has the following on 02/18/2016 surgical pathology: ER 20%, PR 0% MMR: normal    Genetic Testing   Patient has genetic testing done for MSI. Results revealed patient has the following: MSI stable   05/18/2018 - 12/31/2018 Chemotherapy   The patient had carboplatin and Doxil   07/16/2018 Tumor Marker   Patient's tumor was tested for the following markers: CA-125 Results of the tumor marker test revealed 28.7   08/10/2018 Imaging   CT imaging:  Decreased size of peritoneal soft tissue masses in left upper quadrant, consistent with decreased metastatic disease.  No new or progressive metastatic disease. No other acute findings.  Colonic  diverticulosis, without radiographic evidence of diverticulitis.  Stable small paraumbilical ventral hernia containing transverse colon.   08/13/2018 Tumor Marker   Patient's tumor was tested for the following markers: CA-125 Results of the tumor marker test revealed 21.6   08/24/2018 Echocardiogram   LV EF: 60% -  65%   10/15/2018 Tumor Marker   Patient's tumor was tested for the following markers: CA-125 Results of the tumor marker test revealed 20.1   10/19/2018 Imaging   Ct scan of abdomen and pelvis Status post hysterectomy, bilateral salpingo-oophorectomy, and omentectomy.  Two peritoneal implants in the left upper abdomen are stable versus mildly decreased, as above. No abdominopelvic ascites.  No evidence of new/progressive metastatic disease.     11/19/2018 Tumor Marker   Patient's tumor was tested for the following markers: CA-125 Results of the tumor marker test revealed 21.7   11/23/2018 Echocardiogram   1. The left ventricle has normal systolic function of 09-60%. The cavity size was normal. There is no increased left ventricular wall thickness. Echo evidence of impaired diastolic relaxation.  2. The right ventricle has normal systolic function. The cavity was normal. There is no increase in right ventricular wall thickness.  3. The mitral valve is normal in structure. There is mild mitral annular calcification present.  4. The tricuspid valve is normal in structure.  5. The aortic valve is tricuspid There is mild thickening of the aortic valve.  6. The pulmonic valve was normal in structure. Pulmonic  valve regurgitation is mild by color flow Doppler.  7. Normal LV systolic function; mild diastolic dysfunction.   12/31/2018 Tumor Marker   Patient's tumor was tested for the following markers: CA-125 Results of the tumor marker test revealed 17.1   01/31/2019 Imaging   1. Left upper quadrant peritoneal implants described previously have decreased in the interval. No  new or progressive findings in the abdomen/pelvis today. No free fluid. 2. Right paraumbilical ventral hernia contains a small knuckle of transverse colon without complicating features. 3.  Aortic Atherosclerois (ICD10-170.0)  Aortic Atherosclerosis (ICD10-I70.0).   01/31/2019 Tumor Marker   Patient's tumor was tested for the following markers: CA-125 Results of the tumor marker test revealed 18.9   02/11/2019 - 03/13/2019 Chemotherapy   The patient is taking Niraparib. She self discontinued after 1 month   02/25/2019 Tumor Marker   Patient's tumor was tested for the following markers: CA-125 Results of the tumor marker test revealed 18.6   05/13/2019 Tumor Marker   Patient's tumor was tested for the following markers: CA-125 Results of the tumor marker test revealed 230   05/22/2019 Imaging   1. Increased size of 4.6 cm soft tissue mass in the gastrosplenic ligament, consistent with metastatic disease. 2. No other sites of metastatic disease identified within the abdomen or pelvis. 3. Colonic diverticulosis. No radiographic evidence of diverticulitis. 4. Stable small right paraumbilical hernia containing transverse colon.   Aortic Atherosclerosis (ICD10-I70.0).   05/31/2019 - 08/22/2019 Chemotherapy   The patient had bevacizumab and Taxol for chemotherapy treatment.     05/31/2019 Tumor Marker   Patient's tumor was tested for the following markers: CA-125 Results of the tumor marker test revealed 84   06/21/2019 Tumor Marker   Patient's tumor was tested for the following markers: CA-125 Results of the tumor marker test revealed 49.4.   07/19/2019 Tumor Marker   Patient's tumor was tested for the following markers: CA-125. Results of the tumor marker test revealed 44.5   08/22/2019 Imaging   1. Increase in size of 5.0 cm mass in the gastro splenic ligament consistent with metastatic disease. 2. No additional sites of disease identified within the abdomen or pelvis. 3. Unchanged  paraumbilical hernia containing nonobstructed loop of large bowel. 4.  Aortic Atherosclerosis (ICD10-I70.0).     08/30/2019 Tumor Marker   Patient's tumor was tested for the following markers: CA-125 Results of the tumor marker test revealed 71.5   08/30/2019 - 05/11/2020 Chemotherapy   The patient had gemzar for chemotherapy treatment.     09/27/2019 Tumor Marker   Patient's tumor was tested for the following markers: CA-125. Results of the tumor marker test revealed 38.5   10/14/2019 Tumor Marker   Patient's tumor was tested for the following markers: CA-125 Results of the tumor marker test revealed 40.8   10/21/2019 Tumor Marker   Patient's tumor was tested for the following markers: CA-125 Results of the tumor marker test revealed 34.8.   11/22/2019 Imaging   1. Interval decrease in size of left upper quadrant cystic and solid peritoneal lesion. 2. No new sites of disease. 3. Unchanged periumbilical hernia containing a nonobstructed loop of small bowel. 4.  Aortic Atherosclerosis (ICD10-I70.0).     11/25/2019 Tumor Marker   Patient's tumor was tested for the following markers: CA-125 Results of the tumor marker test revealed 22.7   12/23/2019 Tumor Marker   Patient's tumor was tested for the following markers: CA-125 Results of the tumor marker test revealed 26.3   01/20/2020 Tumor Marker  Patient's tumor was tested for the following markers: CA-125 Results of the tumor marker test revealed 28.3   02/14/2020 Imaging   1. 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 2. No new or progressive disease identified within the abdomen or pelvis. 3. Colonic diverticulosis. No radiographic evidence of diverticulitis. 4. Small paraumbilical ventral hernia.     02/17/2020 Tumor Marker   Patient's tumor was tested for the following markers: CA-125 Results of the tumor marker test revealed 40.1   05/11/2020 Tumor Marker   Patient's tumor was  tested for the following markers: CA-125 Results of the tumor marker test revealed 113.   05/22/2020 Imaging   1. 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 2. No evidence of ascites, bowel or ureteral obstruction. 3. 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 No evidence of incarceration or obstruction. 4. Aortic Atherosclerosis (ICD10-I70.0).   05/22/2020 Tumor Marker   Patient's tumor was tested for the following markers: CA-125 Results of the tumor marker test revealed 118   05/29/2020 Echocardiogram   1. Normal GLS -19.8. Left ventricular ejection fraction, by estimation, is 55 to 60%. The left ventricle has normal function. The left ventricle has no regional wall motion abnormalities. Left ventricular diastolic parameters were normal.  2. Right ventricular systolic function is normal. The right ventricular size is normal.  3. The mitral valve is degenerative. Trivial mitral valve regurgitation. No evidence of mitral stenosis.  4. The aortic valve is tricuspid. Aortic valve regurgitation is not visualized. Mild to moderate aortic valve sclerosis/calcification is present, without any evidence of aortic stenosis.  5. The inferior vena cava is normal in size with greater than 50% respiratory variability, suggesting right atrial pressure of 3 mmHg.   06/01/2020 - 10/29/2020 Chemotherapy   The patient had carboplatin and doxil for chemotherapy treatment for 6 cycles    06/01/2020 Tumor Marker   Patient's tumor was tested for the following markers: CA-125 Results of the tumor marker test revealed 126.   06/29/2020 Tumor Marker   Patient's tumor was tested for the following markers: CA-125. Results of the tumor marker test revealed 108   07/27/2020 Tumor Marker   Patient's tumor was tested for the following markers: CA-125. Results of the tumor  marker test revealed 89.1   08/18/2020 Imaging   1. Interval positive response to therapy. 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 No new or progressive metastatic disease in the abdomen or pelvis. 2. Chronic findings include: Small hiatal hernia. Mild to moderate colonic diverticulosis. Aortic Atherosclerosis (ICD10-I70.0).   08/24/2020 Tumor Marker   Patient's tumor was tested for the following markers: CA-125 Results of the tumor marker test revealed 81.2   08/25/2020 Echocardiogram   1. Left ventricular ejection fraction, by estimation, is 60 to 65%. The left ventricle has normal function. The left ventricle has no regional wall motion abnormalities. Left ventricular diastolic parameters were normal. The average left ventricular global longitudinal strain is -21.4 %. The global longitudinal strain is normal.  2. Right ventricular systolic function is normal. The right ventricular size is normal.  3. The mitral valve is normal in structure. No evidence of mitral valve regurgitation. No evidence of mitral stenosis.  4. The aortic valve is tricuspid. There is moderate calcification of the aortic valve. There is moderate thickening of the aortic valve.  Aortic valve regurgitation is not visualized. Mild to moderate aortic valve sclerosis/calcification is present, without any evidence of aortic stenosis.  5. Aortic dilatation noted. There is mild dilatation of the aortic root, measuring 37 mm.  6. The inferior vena cava is normal in size with greater than 50% respiratory variability, suggesting right atrial pressure of 3 mmHg.   09/21/2020 Tumor Marker   Patient's tumor was tested for the following markers: CA-125 Results of the tumor marker test revealed 72.7.   10/01/2020 Tumor Marker   Patient's tumor was tested for the following markers: CA-125 Results of the tumor marker test revealed 80.9   10/29/2020 Tumor Marker   Patient's  tumor was tested for the following markers: CA-125 Results of the tumor marker test revealed 88   11/12/2020 Imaging   1. 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 2. Small right paramidline paraumbilical ventral hernia containing a short segment of mid transverse colon, without evidence of bowel incarceration or obstruction at this time. 3. Colonic diverticulosis without evidence of acute diverticulitis at this time. 4. Aortic atherosclerosis, in addition to left main coronary artery disease. Please note that although the presence of coronary artery calcium documents the presence of coronary artery disease, the severity of this disease and any potential stenosis cannot be assessed on this non-gated CT examination. Assessment for potential risk factor modification, dietary therapy or pharmacologic therapy may be warranted, if clinically indicated. 5. Additional incidental findings, as above.   11/26/2020 -  Chemotherapy    Patient is on Treatment Plan: OVARIAN TOPOTECAN D1,8, Q21D      11/26/2020 Tumor Marker   Patient's tumor was tested for the following markers CA-125 Results of the tumor marker test revealed 89.1   12/03/2020 Tumor Marker   Patient's tumor was tested for the following markers: CA-125 Results of the tumor marker test revealed 84.3     REVIEW OF SYSTEMS:   Constitutional: Denies fevers, chills or abnormal weight loss Eyes: Denies blurriness of vision Ears, nose, mouth, throat, and face: Denies mucositis or sore throat Respiratory: Denies cough, dyspnea or wheezes Cardiovascular: Denies palpitation, chest discomfort or lower extremity swelling Gastrointestinal:  Denies nausea, heartburn or change in bowel habits Skin: Denies abnormal skin rashes Lymphatics: Denies new lymphadenopathy or easy bruising Neurological:Denies numbness, tingling or new weaknesses Behavioral/Psych: Mood is stable, no new changes  All other systems were  reviewed with the patient and are negative.  I have reviewed the past medical history, past surgical history, social history and family history with the patient and they are unchanged from previous note.  ALLERGIES:  has No Known Allergies.  MEDICATIONS:  Current Outpatient Medications  Medication Sig Dispense Refill  . Cholecalciferol (VITAMIN D) 2000 units CAPS Take 1 capsule by mouth daily.    Marland Kitchen ezetimibe (ZETIA) 10 MG tablet Take 10 mg by mouth daily. (Patient not taking: Reported on 01/27/2021)    . lidocaine-prilocaine (EMLA) cream APPLY TO AFFECTED AREA ONCE 30 g 3  . ondansetron (ZOFRAN) 8 MG tablet Take 1 tablet (8 mg total) by mouth 2 (two) times daily as needed (Nausea or vomiting). (Patient not taking: Reported on 01/27/2021) 30 tablet 1  . prochlorperazine (COMPAZINE) 10 MG tablet Take 1 tablet (10 mg total) by mouth every 6 (six) hours as needed (Nausea or vomiting). 60 tablet 1   No current facility-administered medications for this visit.   Facility-Administered Medications Ordered in Other Visits  Medication Dose Route Frequency Provider Last Rate Last Admin  .  topotecan (HYCAMTIN) 4 mg in sodium chloride 0.9 % 100 mL chemo infusion  2.3 mg/m2 (Order-Specific) Intravenous Once Alvy Bimler, Alaura Schippers, MD        PHYSICAL EXAMINATION: ECOG PERFORMANCE STATUS: 0 - Asymptomatic  Vitals:   02/11/21 1311  BP: 132/67  Pulse: 76  Resp: 18  Temp: (!) 97.4 F (36.3 C)  SpO2: 100%   Filed Weights   02/11/21 1311  Weight: 143 lb 12.8 oz (65.2 kg)    GENERAL:alert, no distress and comfortable SKIN: skin color, texture, turgor are normal, no rashes or significant lesions EYES: normal, Conjunctiva are pink and non-injected, sclera clear OROPHARYNX:no exudate, no erythema and lips, buccal mucosa, and tongue normal  NECK: supple, thyroid normal size, non-tender, without nodularity LYMPH:  no palpable lymphadenopathy in the cervical, axillary or inguinal LUNGS: clear to auscultation and  percussion with normal breathing effort HEART: regular rate & rhythm and no murmurs and no lower extremity edema ABDOMEN:abdomen soft, non-tender and normal bowel sounds Musculoskeletal:no cyanosis of digits and no clubbing  NEURO: alert & oriented x 3 with fluent speech, no focal motor/sensory deficits  LABORATORY DATA:  I have reviewed the data as listed    Component Value Date/Time   NA 137 02/11/2021 1245   NA 139 06/07/2017 1430   K 4.5 02/11/2021 1245   K 3.8 06/07/2017 1430   CL 105 02/11/2021 1245   CO2 24 02/11/2021 1245   CO2 27 06/07/2017 1430   GLUCOSE 125 (H) 02/11/2021 1245   GLUCOSE 124 06/07/2017 1430   BUN 18 02/11/2021 1245   BUN 14.0 06/07/2017 1430   CREATININE 0.99 02/11/2021 1245   CREATININE 0.8 06/07/2017 1430   CALCIUM 9.1 02/11/2021 1245   CALCIUM 9.3 06/07/2017 1430   PROT 6.6 02/11/2021 1245   PROT 6.6 09/30/2016 1027   ALBUMIN 3.8 02/11/2021 1245   ALBUMIN 3.5 09/30/2016 1027   AST 17 02/11/2021 1245   AST 18 09/30/2016 1027   ALT 13 02/11/2021 1245   ALT 21 09/30/2016 1027   ALKPHOS 103 02/11/2021 1245   ALKPHOS 77 09/30/2016 1027   BILITOT 0.2 (L) 02/11/2021 1245   BILITOT 0.37 09/30/2016 1027   GFRNONAA 58 (L) 02/11/2021 1245   GFRAA >60 06/29/2020 0913   GFRAA >60 01/20/2020 1029    No results found for: SPEP, UPEP  Lab Results  Component Value Date   WBC 3.0 (L) 02/11/2021   NEUTROABS 1.5 (L) 02/11/2021   HGB 8.2 (L) 02/11/2021   HCT 24.6 (L) 02/11/2021   MCV 94.3 02/11/2021   PLT 224 02/11/2021      Chemistry      Component Value Date/Time   NA 137 02/11/2021 1245   NA 139 06/07/2017 1430   K 4.5 02/11/2021 1245   K 3.8 06/07/2017 1430   CL 105 02/11/2021 1245   CO2 24 02/11/2021 1245   CO2 27 06/07/2017 1430   BUN 18 02/11/2021 1245   BUN 14.0 06/07/2017 1430   CREATININE 0.99 02/11/2021 1245   CREATININE 0.8 06/07/2017 1430      Component Value Date/Time   CALCIUM 9.1 02/11/2021 1245   CALCIUM 9.3 06/07/2017  1430   ALKPHOS 103 02/11/2021 1245   ALKPHOS 77 09/30/2016 1027   AST 17 02/11/2021 1245   AST 18 09/30/2016 1027   ALT 13 02/11/2021 1245   ALT 21 09/30/2016 1027   BILITOT 0.2 (L) 02/11/2021 1245   BILITOT 0.37 09/30/2016 1027

## 2021-02-11 NOTE — Patient Instructions (Signed)
Implanted Port Insertion, Care After This sheet gives you information about how to care for yourself after your procedure. Your health care provider may also give you more specific instructions. If you have problems or questions, contact your health care provider. What can I expect after the procedure? After the procedure, it is common to have:  Discomfort at the port insertion site.  Bruising on the skin over the port. This should improve over 3-4 days. Follow these instructions at home: Port care  After your port is placed, you will get a manufacturer's information card. The card has information about your port. Keep this card with you at all times.  Take care of the port as told by your health care provider. Ask your health care provider if you or a family member can get training for taking care of the port at home. A home health care nurse may also take care of the port.  Make sure to remember what type of port you have. Incision care  Follow instructions from your health care provider about how to take care of your port insertion site. Make sure you: ? Wash your hands with soap and water before and after you change your bandage (dressing). If soap and water are not available, use hand sanitizer. ? Change your dressing as told by your health care provider. ? Leave stitches (sutures), skin glue, or adhesive strips in place. These skin closures may need to stay in place for 2 weeks or longer. If adhesive strip edges start to loosen and curl up, you may trim the loose edges. Do not remove adhesive strips completely unless your health care provider tells you to do that.  Check your port insertion site every day for signs of infection. Check for: ? Redness, swelling, or pain. ? Fluid or blood. ? Warmth. ? Pus or a bad smell.      Activity  Return to your normal activities as told by your health care provider. Ask your health care provider what activities are safe for you.  Do not  lift anything that is heavier than 10 lb (4.5 kg), or the limit that you are told, until your health care provider says that it is safe. General instructions  Take over-the-counter and prescription medicines only as told by your health care provider.  Do not take baths, swim, or use a hot tub until your health care provider approves. Ask your health care provider if you may take showers. You may only be allowed to take sponge baths.  Do not drive for 24 hours if you were given a sedative during your procedure.  Wear a medical alert bracelet in case of an emergency. This will tell any health care providers that you have a port.  Keep all follow-up visits as told by your health care provider. This is important. Contact a health care provider if:  You cannot flush your port with saline as directed, or you cannot draw blood from the port.  You have a fever or chills.  You have redness, swelling, or pain around your port insertion site.  You have fluid or blood coming from your port insertion site.  Your port insertion site feels warm to the touch.  You have pus or a bad smell coming from the port insertion site. Get help right away if:  You have chest pain or shortness of breath.  You have bleeding from your port that you cannot control. Summary  Take care of the port as told by your   health care provider. Keep the manufacturer's information card with you at all times.  Change your dressing as told by your health care provider.  Contact a health care provider if you have a fever or chills or if you have redness, swelling, or pain around your port insertion site.  Keep all follow-up visits as told by your health care provider. This information is not intended to replace advice given to you by your health care provider. Make sure you discuss any questions you have with your health care provider. Document Revised: 05/01/2018 Document Reviewed: 05/01/2018 [employer]vier Patient Education   2021 [employer]  

## 2021-02-11 NOTE — Assessment & Plan Note (Signed)
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

## 2021-02-12 ENCOUNTER — Encounter: Payer: MEDICARE | Attending: Family Medicine | Primary: Family Medicine

## 2021-02-15 ENCOUNTER — Telehealth: Payer: Self-pay | Admitting: Oncology

## 2021-02-15 NOTE — Telephone Encounter (Signed)
Pls move to 5/11. It takes 2 days for report to come back

## 2021-02-15 NOTE — Telephone Encounter (Signed)
Rescheduled appointment to 5/11 at 8:40.  Jessica Johnston is aware of new appointment date and time.

## 2021-02-15 NOTE — Telephone Encounter (Signed)
Jessica Johnston left a message saying that her CT is scheduled for 02/22/21 at 7:30 and her appointment with Dr. Alvy Bimler is at 8:40 the same day.  She is wondering if she needs to reschedule the appointment with Dr. Alvy Bimler.

## 2021-02-16 ENCOUNTER — Telehealth: Payer: Self-pay | Admitting: Oncology

## 2021-02-16 NOTE — Telephone Encounter (Signed)
Jessica Johnston left a message that she will be out of town on 02/24/21 and is wondering if she can reschedule her appointment to 5/12 or 5/13.

## 2021-02-16 NOTE — Telephone Encounter (Signed)
Called [facility] back and rescheduled apt to 02/23/21 at 8:30.

## 2021-02-22 ENCOUNTER — Other Ambulatory Visit: Payer: Self-pay

## 2021-02-22 ENCOUNTER — Ambulatory Visit: Payer: Medicare Other | Admitting: Hematology and Oncology

## 2021-02-22 ENCOUNTER — Ambulatory Visit (HOSPITAL_COMMUNITY)
Admission: RE | Admit: 2021-02-22 | Discharge: 2021-02-22 | Disposition: A | Discharge status: Home / Self Care | Payer: Medicare Other | Source: Ambulatory Visit | Attending: Hematology and Oncology | Admitting: Hematology and Oncology

## 2021-02-22 DIAGNOSIS — K439 Ventral hernia without obstruction or gangrene: Secondary | ICD-10-CM | POA: Diagnosis not present

## 2021-02-22 DIAGNOSIS — S3219XA Other fracture of sacrum, initial encounter for closed fracture: Secondary | ICD-10-CM | POA: Diagnosis not present

## 2021-02-22 DIAGNOSIS — C5700 Malignant neoplasm of unspecified fallopian tube: Secondary | ICD-10-CM | POA: Insufficient documentation

## 2021-02-22 DIAGNOSIS — C786 Secondary malignant neoplasm of retroperitoneum and peritoneum: Secondary | ICD-10-CM | POA: Diagnosis not present

## 2021-02-22 DIAGNOSIS — C762 Malignant neoplasm of abdomen: Secondary | ICD-10-CM | POA: Diagnosis not present

## 2021-02-22 DIAGNOSIS — C569 Malignant neoplasm of unspecified ovary: Secondary | ICD-10-CM | POA: Diagnosis not present

## 2021-02-22 DIAGNOSIS — K469 Unspecified abdominal hernia without obstruction or gangrene: Secondary | ICD-10-CM | POA: Diagnosis not present

## 2021-02-22 MED ORDER — IOHEXOL 300 MG/ML  SOLN
100.0000 mL | Freq: Once | INTRAMUSCULAR | Status: AC | PRN
  Administered 2021-02-22: 100 mL via INTRAVENOUS

## 2021-02-22 MED ORDER — SODIUM CHLORIDE (PF) 0.9 % IJ SOLN
INTRAMUSCULAR | Status: AC
  Filled 2021-02-22: qty 50

## 2021-02-23 ENCOUNTER — Encounter: Payer: Self-pay | Admitting: Hematology and Oncology

## 2021-02-23 ENCOUNTER — Inpatient Hospital Stay: Payer: Medicare Other | Attending: Hematology and Oncology | Admitting: Hematology and Oncology

## 2021-02-23 ENCOUNTER — Telehealth: Payer: Self-pay

## 2021-02-23 DIAGNOSIS — Z7189 Other specified counseling: Secondary | ICD-10-CM | POA: Diagnosis not present

## 2021-02-23 DIAGNOSIS — C5700 Malignant neoplasm of unspecified fallopian tube: Secondary | ICD-10-CM | POA: Diagnosis not present

## 2021-02-23 DIAGNOSIS — D61818 Other pancytopenia: Secondary | ICD-10-CM

## 2021-02-23 DIAGNOSIS — E538 Deficiency of other specified B group vitamins: Secondary | ICD-10-CM | POA: Diagnosis not present

## 2021-02-23 NOTE — Telephone Encounter (Signed)
RN contacted [facility] and spoke with Manus Gunning to coordinate a new patient referral for palliative care with hospice services.

## 2021-02-23 NOTE — Assessment & Plan Note (Signed)
She has multifactorial anemia, combination of vitamin B12 deficiency and side effects of treatment I am hopeful with discontinuation of treatment, her blood counts will improve We will switch out vitamin B-12 injection to oral B12 supplement

## 2021-02-23 NOTE — Assessment & Plan Note (Signed)
I have reviewed multiple imaging studies with the patient and her husband Unfortunately, she continues to have disease progression Thankfully, she is not symptomatic We discussed the risk and benefits of further palliative chemotherapy versus hospice The patient has made informed decision not to pursue further treatment We will get referral to palliative care with hospice service

## 2021-02-23 NOTE — Progress Notes (Signed)
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 anemia, combination of vitamin B12 deficiency and side effects of treatment I am hopeful with discontinuation of treatment, her blood counts will improve We will switch out vitamin B-12 injection to oral B12 supplement  Goals of care, counseling/discussion We have numerous goals of care discussions in the past The patient is being realistic She has made informed decision that the last chemo treatment would be the last treatment for her  She has advanced directives and living will I gave her a DNR order She is in agreement for palliative care referral with hospice services   No orders of the defined types were placed in this encounter.   All questions were answered. The patient knows to call the clinic with any problems, questions or concerns. The total time spent in the appointment was 40 minutes encounter with patients including review of chart and various tests results, discussions about plan of care and coordination of care plan   [name], MD 02/23/2021 9:38 AM  INTERVAL HISTORY: Please see below for problem oriented charting. 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 Extensive omental nodularity highly worrisome for peritoneal carcinomatosis. Late recurrence of melanoma can present as peritoneal carcinomatosis. Ovarian cancer more commonly presents in this manner. Patient does have a predominately low density right adnexal lesion measuring up to 3.5 cm, although this lesion is not typical for ovarian cancer. 2. No evidence of bowel or ureteral obstruction. 3. No other definite signs of metastatic disease. There are small indeterminate low-density hepatic lesions.   02/08/2016 Tumor Marker   Patient's tumor was tested for the following markers: CA-125 Results of the tumor marker test revealed 656.2   02/15/2016 Procedure   CT-guided core biopsy performed of omental mass just deep to the abdominal wall.   02/15/2016 Pathology Results   Omentum, biopsy, left - PAPILLARY SEROUS NEOPLASM, SEE COMMENT. Microscopic Comment There are papillary collections of largely low grade appearing cells with numerous psammoma bodies. Given the limited material it is difficult to distinguish between invasive implants of a serous borderline tumor and serous carcinoma, especially given the low grade appearance.   02/18/2016 Pathology Results   1. Omentum, resection for tumor INVASIVE IMPLANT OF HIGH GRADE SEROUS CARCINOMA 2. Uterus +/- tubes/ovaries, neoplastic HIGH GRADE SEROUS CARCINOMA INVOLVING LEFT TUBAL FIMBRIA SEROUS CARCINOMA WITH PREDOMINANT PSAMMOMA BODIES IMPLANT AT UTERUS SEROSA, BILATERAL FALLOPIAN TUBAL SEROSA, AND ANTERIOR PERITONEAL REFLECTION CERVIX: HISTOLOGICAL UNREMARKABLE ENDOMETRIUM: INACTIVE  ENDOMETRIUM MYOMETRIUM: LEIOMYOMA LEFT OVARY: CYSTADENOFIBROMA RIGHT OVARY AND FALLOPIAN TUBE: HISTOLOGICAL UNREMARKABLE Microscopic Comment 2. ONCOLOGY TABLE - FALLOPIAN TUBE 1. Specimen, including laterality: Omentum, uterus, bilateral ovaries and fallopian tubes 2. Procedure: Hysterectomy, bilateral salpingo-oophorectomy and tumor debulking omentectomy 3. Lymph node sampling performed: No 4. Tumor site: uterus serosa, bilateral fallopian tubal serosa, peritoneal and omentum 5. Tumor location in fallopian tube: Left fallopian tubal fimbria 6. Specimen integrity (intact/ruptured/disrupted): Intact 7. Tumor size (cm): multi focal invasive omentum implant greater than 2 cm, left fallopian tube tumor 0.8 cm. 8. Histologic type: Serous carcinoma 9. Grade: 3 10. Microscopic tumor extension: uterus serosa, bilateral fallopian tubal serosa, peritoneal and omentum 11. Margins: NA 12. Lymph-Vascular invasion: identified 13. Lymph nodes: # examined: 0; # positive: NA 14. TNM: pT3c, pNx 15. FIGO Stage (based on pathologic findings, needs clinical correlation: IIIC 16. Comment: High grade serous carcinoma multifocally and extensively involves left fallopian tubal fimbria, omentum, uterus serosa, bilateral fallopian tubal serosa and peritoneal. At the left fallopian tube there are small foci of serous tubal intraepithelial carcinoma identified, so we conclude the carcinoma is fallopian tube origin   03/09/2016 Tumor Marker   Patient's tumor was tested for the following markers: CA-125 Results of the tumor marker test revealed 154.4   03/15/2016 Procedure   Technically successful right IJ power-injectable port catheter placement. Ready for routine use.   03/18/2016 - 07/13/2016 Chemotherapy   The patient had 6 cycles of carboplatin and taxol   04/14/2016 Tumor Marker   Patient's tumor was tested for the following markers: CA-125 Results of the tumor marker test revealed 36.7   04/25/2016 Genetic  Testing   Patient has genetic testing done for germline mutation Results revealed patient has no mutation   04/28/2016 Tumor Marker   Patient's tumor was tested for the following markers: CA-125 Results of the tumor marker test revealed 24.6   06/21/2016 Tumor Marker   Patient's tumor was tested for the following markers: CA-125 Results of the tumor marker test revealed 16.6   08/01/2016 Tumor Marker   Patient's tumor was tested for the following markers: CA-125 Results of the tumor marker test revealed 17.2   08/05/2016 Imaging   Interval TAH-BSO. No evidence of residual pelvic mass or metastatic disease within the abdomen or pelvis. No other acute findings.    11/04/2016 Tumor Marker   Patient's tumor was tested for the following markers: CA-125 Results of the tumor marker test revealed 13.6   01/20/2017 Tumor Marker   Patient's tumor was tested for the following markers: CA-125 Results of the tumor marker test revealed 13.8   04/14/2017 Tumor Marker   Patient's tumor was tested for the following markers: CA-125 Results of the tumor marker test revealed 16.1   06/13/2017 Imaging   No acute findings.  No mass or hernia identified.  Colonic diverticulosis, without radiographic evidence of diverticulitis.  Aortic atherosclerosis.   07/03/2017 Tumor Marker   Patient's tumor was tested for the following markers: CA-125 Results of the tumor marker test revealed 18.4   09/19/2017 Tumor Marker   Patient's tumor was tested for the following markers: CA-125 Results of the tumor marker test revealed 18.5   01/23/2018 Tumor Marker   Patient's tumor was tested for the following markers: CA-125 Results of the tumor marker test revealed 35.4   01/30/2018 Imaging   1. No evidence of local fallopian tube carcinoma recurrence within the pelvis. Post hysterectomy. 2. No evidence of metastatic peritoneal disease or omental disease. No solid organ metastasis.  03/20/2018 Tumor Marker    Patient's tumor was tested for the following markers: CA-125 Results of the tumor marker test revealed 73   05/02/2018 Tumor Marker   Patient's tumor was tested for the following markers: CA-125 Results of the tumor marker test revealed 127.1   05/08/2018 Imaging   CT abdomen and pelvis 1. 7 cm left subdiaphragmatic collection of loculated fluid versus low-density soft tissue. Given the patient's history of rising CA 125, metastatic disease considered highly likely.  2. Areas of fluid or recurrent disease identified in the right lower quadrant adjacent to the cecum and along right lower quadrant small bowel loops.   05/14/2018 Tumor Marker   Patient's tumor was tested for the following markers: CA-125 Results of the tumor marker test revealed 166    Genetic Testing   Patient has genetic testing done for ER/PR and MMR. Results revealed patient has the following on 02/18/2016 surgical pathology: ER 20%, PR 0% MMR: normal    Genetic Testing   Patient has genetic testing done for MSI. Results revealed patient has the following: MSI stable   05/18/2018 - 12/31/2018 Chemotherapy   The patient had carboplatin and Doxil   07/16/2018 Tumor Marker   Patient's tumor was tested for the following markers: CA-125 Results of the tumor marker test revealed 28.7   08/10/2018 Imaging   CT imaging:  Decreased size of peritoneal soft tissue masses in left upper quadrant, consistent with decreased metastatic disease.  No new or progressive metastatic disease. No other acute findings.  Colonic diverticulosis, without radiographic evidence of diverticulitis.  Stable small paraumbilical ventral hernia containing transverse colon.   08/13/2018 Tumor Marker   Patient's tumor was tested for the following markers: CA-125 Results of the tumor marker test revealed 21.6   08/24/2018 Echocardiogram   LV EF: 60% -  65%   10/15/2018 Tumor Marker   Patient's tumor was tested for the following markers:  CA-125 Results of the tumor marker test revealed 20.1   10/19/2018 Imaging   Ct scan of abdomen and pelvis Status post hysterectomy, bilateral salpingo-oophorectomy, and omentectomy.  Two peritoneal implants in the left upper abdomen are stable versus mildly decreased, as above. No abdominopelvic ascites.  No evidence of new/progressive metastatic disease.     11/19/2018 Tumor Marker   Patient's tumor was tested for the following markers: CA-125 Results of the tumor marker test revealed 21.7   11/23/2018 Echocardiogram   1. The left ventricle has normal systolic function of 22-63%. The cavity size was normal. There is no increased left ventricular wall thickness. Echo evidence of impaired diastolic relaxation.  2. The right ventricle has normal systolic function. The cavity was normal. There is no increase in right ventricular wall thickness.  3. The mitral valve is normal in structure. There is mild mitral annular calcification present.  4. The tricuspid valve is normal in structure.  5. The aortic valve is tricuspid There is mild thickening of the aortic valve.  6. The pulmonic valve was normal in structure. Pulmonic valve regurgitation is mild by color flow Doppler.  7. Normal LV systolic function; mild diastolic dysfunction.   12/31/2018 Tumor Marker   Patient's tumor was tested for the following markers: CA-125 Results of the tumor marker test revealed 17.1   01/31/2019 Imaging   1. Left upper quadrant peritoneal implants described previously have decreased in the interval. No new or progressive findings in the abdomen/pelvis today. No free fluid. 2. Right paraumbilical ventral hernia contains a small knuckle of transverse  colon without complicating features. 3.  Aortic Atherosclerois (ICD10-170.0)  Aortic Atherosclerosis (ICD10-I70.0).   01/31/2019 Tumor Marker   Patient's tumor was tested for the following markers: CA-125 Results of the tumor marker test revealed 18.9    02/11/2019 - 03/13/2019 Chemotherapy   The patient is taking Niraparib. She self discontinued after 1 month   02/25/2019 Tumor Marker   Patient's tumor was tested for the following markers: CA-125 Results of the tumor marker test revealed 18.6   05/13/2019 Tumor Marker   Patient's tumor was tested for the following markers: CA-125 Results of the tumor marker test revealed 230   05/22/2019 Imaging   1. Increased size of 4.6 cm soft tissue mass in the gastrosplenic ligament, consistent with metastatic disease. 2. No other sites of metastatic disease identified within the abdomen or pelvis. 3. Colonic diverticulosis. No radiographic evidence of diverticulitis. 4. Stable small right paraumbilical hernia containing transverse colon.   Aortic Atherosclerosis (ICD10-I70.0).   05/31/2019 - 08/22/2019 Chemotherapy   The patient had bevacizumab and Taxol for chemotherapy treatment.     05/31/2019 Tumor Marker   Patient's tumor was tested for the following markers: CA-125 Results of the tumor marker test revealed 84   06/21/2019 Tumor Marker   Patient's tumor was tested for the following markers: CA-125 Results of the tumor marker test revealed 49.4.   07/19/2019 Tumor Marker   Patient's tumor was tested for the following markers: CA-125. Results of the tumor marker test revealed 44.5   08/22/2019 Imaging   1. Increase in size of 5.0 cm mass in the gastro splenic ligament consistent with metastatic disease. 2. No additional sites of disease identified within the abdomen or pelvis. 3. Unchanged paraumbilical hernia containing nonobstructed loop of large bowel. 4.  Aortic Atherosclerosis (ICD10-I70.0).     08/30/2019 Tumor Marker   Patient's tumor was tested for the following markers: CA-125 Results of the tumor marker test revealed 71.5   08/30/2019 - 05/11/2020 Chemotherapy   The patient had gemzar for chemotherapy treatment.     09/27/2019 Tumor Marker   Patient's tumor was tested for  the following markers: CA-125. Results of the tumor marker test revealed 38.5   10/14/2019 Tumor Marker   Patient's tumor was tested for the following markers: CA-125 Results of the tumor marker test revealed 40.8   10/21/2019 Tumor Marker   Patient's tumor was tested for the following markers: CA-125 Results of the tumor marker test revealed 34.8.   11/22/2019 Imaging   1. Interval decrease in size of left upper quadrant cystic and solid peritoneal lesion. 2. No new sites of disease. 3. Unchanged periumbilical hernia containing a nonobstructed loop of small bowel. 4.  Aortic Atherosclerosis (ICD10-I70.0).     11/25/2019 Tumor Marker   Patient's tumor was tested for the following markers: CA-125 Results of the tumor marker test revealed 22.7   12/23/2019 Tumor Marker   Patient's tumor was tested for the following markers: CA-125 Results of the tumor marker test revealed 26.3   01/20/2020 Tumor Marker   Patient's tumor was tested for the following markers: CA-125 Results of the tumor marker test revealed 28.3   02/14/2020 Imaging   1. 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 2. No new or progressive disease identified within the abdomen or pelvis. 3. Colonic diverticulosis. No radiographic evidence of diverticulitis. 4. Small paraumbilical ventral hernia.     02/17/2020 Tumor Marker   Patient's tumor was tested for the following markers: CA-125  Results of the tumor marker test revealed 40.1   05/11/2020 Tumor Marker   Patient's tumor was tested for the following markers: CA-125 Results of the tumor marker test revealed 113.   05/22/2020 Imaging   1. 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 2. No evidence of ascites, bowel or ureteral obstruction. 3. Chronic right periumbilical hernia containing a portion of the transverse  colon. No evidence of incarceration or obstruction. 4. Aortic Atherosclerosis (ICD10-I70.0).   05/22/2020 Tumor Marker   Patient's tumor was tested for the following markers: CA-125 Results of the tumor marker test revealed 118   05/29/2020 Echocardiogram   1. Normal GLS -19.8. Left ventricular ejection fraction, by estimation, is 55 to 60%. The left ventricle has normal function. The left ventricle has no regional wall motion abnormalities. Left ventricular diastolic parameters were normal.  2. Right ventricular systolic function is normal. The right ventricular size is normal.  3. The mitral valve is degenerative. Trivial mitral valve regurgitation. No evidence of mitral stenosis.  4. The aortic valve is tricuspid. Aortic valve regurgitation is not visualized. Mild to moderate aortic valve sclerosis/calcification is present, without any evidence of aortic stenosis.  5. The inferior vena cava is normal in size with greater than 50% respiratory variability, suggesting right atrial pressure of 3 mmHg.   06/01/2020 - 10/29/2020 Chemotherapy   The patient had carboplatin and doxil for chemotherapy treatment for 6 cycles    06/01/2020 Tumor Marker   Patient's tumor was tested for the following markers: CA-125 Results of the tumor marker test revealed 126.   06/29/2020 Tumor Marker   Patient's tumor was tested for the following markers: CA-125. Results of the tumor marker test revealed 108   07/27/2020 Tumor Marker   Patient's tumor was tested for the following markers: CA-125. Results of the tumor marker test revealed 89.1   08/18/2020 Imaging   1. Interval positive response to therapy. 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 No new or progressive metastatic disease in the abdomen or pelvis. 2. Chronic findings include: Small hiatal hernia. Mild to moderate colonic diverticulosis. Aortic Atherosclerosis (ICD10-I70.0).   08/24/2020  Tumor Marker   Patient's tumor was tested for the following markers: CA-125 Results of the tumor marker test revealed 81.2   08/25/2020 Echocardiogram   1. Left ventricular ejection fraction, by estimation, is 60 to 65%. The left ventricle has normal function. The left ventricle has no regional wall motion abnormalities. Left ventricular diastolic parameters were normal. The average left ventricular global longitudinal strain is -21.4 %. The global longitudinal strain is normal.  2. Right ventricular systolic function is normal. The right ventricular size is normal.  3. The mitral valve is normal in structure. No evidence of mitral valve regurgitation. No evidence of mitral stenosis.  4. The aortic valve is tricuspid. There is moderate calcification of the aortic valve. There is moderate thickening of the aortic valve. Aortic valve regurgitation is not visualized. Mild to moderate aortic valve sclerosis/calcification is present, without any evidence of aortic stenosis.  5. Aortic dilatation noted. There is mild dilatation of the aortic root, measuring 37 mm.  6. The inferior vena cava is normal in size with greater than 50% respiratory variability, suggesting right atrial pressure of 3 mmHg.   09/21/2020 Tumor Marker   Patient's tumor was tested for the following markers: CA-125 Results of the tumor marker test  revealed 72.7.   10/01/2020 Tumor Marker   Patient's tumor was tested for the following markers: CA-125 Results of the tumor marker test revealed 80.9   10/29/2020 Tumor Marker   Patient's tumor was tested for the following markers: CA-125 Results of the tumor marker test revealed 88   11/12/2020 Imaging   1. 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 2. Small right paramidline paraumbilical ventral hernia containing a short segment of mid transverse colon, without evidence of bowel incarceration or obstruction at this time. 3. Colonic diverticulosis  without evidence of acute diverticulitis at this time. 4. Aortic atherosclerosis, in addition to left main coronary artery disease. Please note that although the presence of coronary artery calcium documents the presence of coronary artery disease, the severity of this disease and any potential stenosis cannot be assessed on this non-gated CT examination. Assessment for potential risk factor modification, dietary therapy or pharmacologic therapy may be warranted, if clinically indicated. 5. Additional incidental findings, as above.   11/26/2020 - 02/11/2021 Chemotherapy         11/26/2020 Tumor Marker   Patient's tumor was tested for the following markers CA-125 Results of the tumor marker test revealed 89.1   12/03/2020 Tumor Marker   Patient's tumor was tested for the following markers: CA-125 Results of the tumor marker test revealed 84.3   02/22/2021 Imaging   1. Enlarging perihepatic/hepatic disease as described. 2. Slight enlargement of perisplenic implant. 3. Tiny implant adjacent to the transverse colon not definitely seen previously, small size makes this fact of uncertain significance. Attention on follow-up. 4. Bilateral sacral insufficiency fractures worse on the RIGHT. Correlate with history of pelvic pain that may be worsening. Some sclerosis seen on prior imaging. Clear fracture line currently demonstrated in the RIGHT hemi sacrum with developing fracture line on the LEFT. 5. Small anterior abdominal wall hernia containing a portion of the colon, not complicated by obstruction or adjacent stranding. 6. Aortic atherosclerosis.     REVIEW OF SYSTEMS:   Constitutional: Denies fevers, chills or abnormal weight loss Eyes: Denies blurriness of vision Ears, nose, mouth, throat, and face: Denies mucositis or sore throat Respiratory: Denies cough, dyspnea or wheezes Cardiovascular: Denies palpitation, chest discomfort or lower extremity swelling Gastrointestinal:  Denies nausea,  heartburn or change in bowel habits Skin: Denies abnormal skin rashes Lymphatics: Denies new lymphadenopathy or easy bruising Neurological:Denies numbness, tingling or new weaknesses Behavioral/Psych: Mood is stable, no new changes  All other systems were reviewed with the patient and are negative.  I have reviewed the past medical history, past surgical history, social history and family history with the patient and they are unchanged from previous note.  ALLERGIES:  has No Known Allergies.  MEDICATIONS:  Current Outpatient Medications  Medication Sig Dispense Refill  . Cholecalciferol (VITAMIN D) 2000 units CAPS Take 1 capsule by mouth daily.    Marland Kitchen ezetimibe (ZETIA) 10 MG tablet Take 10 mg by mouth daily. (Patient not taking: Reported on 01/27/2021)    . lidocaine-prilocaine (EMLA) cream APPLY TO AFFECTED AREA ONCE 30 g 3  . ondansetron (ZOFRAN) 8 MG tablet Take 1 tablet (8 mg total) by mouth 2 (two) times daily as needed (Nausea or vomiting). (Patient not taking: Reported on 01/27/2021) 30 tablet 1  . prochlorperazine (COMPAZINE) 10 MG tablet Take 1 tablet (10 mg total) by mouth every 6 (six) hours as needed (Nausea or vomiting). 60 tablet 1   No current facility-administered medications for this visit.  PHYSICAL EXAMINATION: ECOG PERFORMANCE STATUS: 1 - Symptomatic but completely ambulatory  Vitals:   02/23/21 0844  BP: 126/62  Pulse: 72  Resp: 16  Temp: (!) 97.5 F (36.4 C)  SpO2: 100%   Filed Weights   02/23/21 0844  Weight: 144 lb (65.3 kg)    GENERAL:alert, no distress and comfortable NEURO: alert & oriented x 3 with fluent speech, no focal motor/sensory deficits  LABORATORY DATA:  I have reviewed the data as listed    Component Value Date/Time   NA 137 02/11/2021 1245   NA 139 06/07/2017 1430   K 4.5 02/11/2021 1245   K 3.8 06/07/2017 1430   CL 105 02/11/2021 1245   CO2 24 02/11/2021 1245   CO2 27 06/07/2017 1430   GLUCOSE 125 (H) 02/11/2021 1245    GLUCOSE 124 06/07/2017 1430   BUN 18 02/11/2021 1245   BUN 14.0 06/07/2017 1430   CREATININE 0.99 02/11/2021 1245   CREATININE 0.8 06/07/2017 1430   CALCIUM 9.1 02/11/2021 1245   CALCIUM 9.3 06/07/2017 1430   PROT 6.6 02/11/2021 1245   PROT 6.6 09/30/2016 1027   ALBUMIN 3.8 02/11/2021 1245   ALBUMIN 3.5 09/30/2016 1027   AST 17 02/11/2021 1245   AST 18 09/30/2016 1027   ALT 13 02/11/2021 1245   ALT 21 09/30/2016 1027   ALKPHOS 103 02/11/2021 1245   ALKPHOS 77 09/30/2016 1027   BILITOT 0.2 (L) 02/11/2021 1245   BILITOT 0.37 09/30/2016 1027   GFRNONAA 58 (L) 02/11/2021 1245   GFRAA >60 06/29/2020 0913   GFRAA >60 01/20/2020 1029    No results found for: SPEP, UPEP  Lab Results  Component Value Date   WBC 3.0 (L) 02/11/2021   NEUTROABS 1.5 (L) 02/11/2021   HGB 8.2 (L) 02/11/2021   HCT 24.6 (L) 02/11/2021   MCV 94.3 02/11/2021   PLT 224 02/11/2021      Chemistry      Component Value Date/Time   NA 137 02/11/2021 1245   NA 139 06/07/2017 1430   K 4.5 02/11/2021 1245   K 3.8 06/07/2017 1430   CL 105 02/11/2021 1245   CO2 24 02/11/2021 1245   CO2 27 06/07/2017 1430   BUN 18 02/11/2021 1245   BUN 14.0 06/07/2017 1430   CREATININE 0.99 02/11/2021 1245   CREATININE 0.8 06/07/2017 1430      Component Value Date/Time   CALCIUM 9.1 02/11/2021 1245   CALCIUM 9.3 06/07/2017 1430   ALKPHOS 103 02/11/2021 1245   ALKPHOS 77 09/30/2016 1027   AST 17 02/11/2021 1245   AST 18 09/30/2016 1027   ALT 13 02/11/2021 1245   ALT 21 09/30/2016 1027   BILITOT 0.2 (L) 02/11/2021 1245   BILITOT 0.37 09/30/2016 1027       RADIOGRAPHIC STUDIES: I have reviewed multiple imaging studies with the patient and her husband I have personally reviewed the radiological images as listed and agreed with the findings in the report. CT ABDOMEN PELVIS W CONTRAST  Result Date: 02/22/2021 CLINICAL DATA:  Ovarian cancer, assess treatment response in this 80 year old female, previous imaging  displaying worsening of disease in the liver and peritoneum. EXAM: CT ABDOMEN AND PELVIS WITH CONTRAST TECHNIQUE: Multidetector CT imaging of the abdomen and pelvis was performed using the standard protocol following bolus administration of intravenous contrast. CONTRAST:  174mL OMNIPAQUE IOHEXOL 300 MG/ML  SOLN COMPARISON:  November 12, 2020. FINDINGS: Lower chest: Incidental imaging of the lung bases shows no effusion or consolidation. Basilar atelectasis. Hepatobiliary:  Hepatic and perihepatic disease, suspect the disease along hepatic subsegment II is actually a capsular implant and also other areas more suggestive of origin is capsular implants with the area in hepatic subsegment VIII extending into the liver substance. (Image 8/2) 1.8 cm lesion in hepatic subsegment II is stable. (Image 6/2 3.4 x 3.4 cm lesion in hepatic subsegment VIII along the margin of the capsule with extension in the liver substance previously measured 2.9 x 2.3 cm. (Image 15/2 perihepatic implant overlying a [place]ock subsegment V 2.8 x 2.4 cm previously 2.2 x 0.9 cm. Perihepatic nodule on image 15 of series 2 anterior to hepatic subsegment III measuring 0.9 x 0.3 cm, previously very subtle subcentimeter nodularity in this location. Portal vein is patent. No pericholecystic stranding. No biliary duct dilation. Pancreas: Mild pancreatic atrophy. No signs of pancreatic inflammation. 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 Spleen is otherwise unremarkable. Adrenals/Urinary Tract: Adrenal glands with mild thickening of the bilateral adrenals. Mildly malrotated RIGHT and LEFT kidney without signs of hydronephrosis. Urinary bladder with smooth contours. No hydronephrosis. No nephrolithiasis. Small cyst in the upper pole the RIGHT kidney. Stomach/Bowel: Small hiatal hernia. No acute gastric or small bowel process. Sigmoid diverticulosis. Appendix is normal. Vascular/Lymphatic: Calcified and  noncalcified atheromatous plaque in a nonaneurysmal abdominal aorta without signs of adenopathy in the retroperitoneum or upper abdomen. Smooth contour of the IVC. No mesenteric adenopathy. Reproductive: Post hysterectomy. No soft tissue mass in the pelvis. No pelvic lymphadenopathy. Other: Tiny implant adjacent to the transverse colon (image 29/2 not definitely seen previously, small size makes this fact of uncertain significance. Perisplenic implant extending into the splenic hilum measuring 5.4 x 3.7 cm (image 14 and 12 of series 2) previously 5.1 x 3.7 cm.w Subtle adjacent nodularity along the margin of the spleen is similar as well. Small anterior abdominal wall hernia containing a portion of the colon, not complicated by obstruction or adjacent stranding. Musculoskeletal: Spinal degenerative changes. Sclerosis of the bilateral sacral ala with clear lucent line in the RIGHT hemi sacrum. This is more pronounced than on prior imaging and shows a linear pattern along the margin of the sacrum. Pattern compatible with insufficiency fractures of the sacrum worse on the RIGHT than the LEFT IMPRESSION: 1. Enlarging perihepatic/hepatic disease as described. 2. Slight enlargement of perisplenic implant. 3. Tiny implant adjacent to the transverse colon not definitely seen previously, small size makes this fact of uncertain significance. Attention on follow-up. 4. Bilateral sacral insufficiency fractures worse on the RIGHT. Correlate with history of pelvic pain that may be worsening. Some sclerosis seen on prior imaging. Clear fracture line currently demonstrated in the RIGHT hemi sacrum with developing fracture line on the LEFT. 5. Small anterior abdominal wall hernia containing a portion of the colon, not complicated by obstruction or adjacent stranding. 6. Aortic atherosclerosis. These results will be called to the ordering clinician or representative by the Radiologist Assistant, and communication documented in the  PACS or [employer]. Electronically Signed   By: Zetta Bills M.D.   On: 02/22/2021 09:13

## 2021-02-23 NOTE — Assessment & Plan Note (Signed)
We have numerous goals of care discussions in the past The patient is being realistic She has made informed decision that the last chemo treatment would be the last treatment for her  She has advanced directives and living will I gave her a DNR order She is in agreement for palliative care referral with hospice services

## 2021-02-24 ENCOUNTER — Ambulatory Visit: Payer: Medicare Other | Admitting: Hematology and Oncology

## 2021-02-26 MED ORDER — BENICAR HCT 20 MG-12.5 MG TABLET
ORAL_TABLET | ORAL | 3 refills | Status: DC
Start: 2021-02-26 — End: 2021-04-21

## 2021-03-01 ENCOUNTER — Telehealth: Payer: Self-pay

## 2021-03-01 NOTE — Telephone Encounter (Signed)
ok 

## 2021-03-01 NOTE — Telephone Encounter (Signed)
Manus Gunning from [facility] called to inform that patient has decided to hold on hospice enrollment at this time.  Patient has verbalized she is going to follow up with a provider at [facility], and agreed to notify hospice when she is ready for enrollment.

## 2021-03-02 ENCOUNTER — Other Ambulatory Visit: Payer: Self-pay

## 2021-03-02 ENCOUNTER — Telehealth: Payer: Self-pay

## 2021-03-02 ENCOUNTER — Ambulatory Visit: Payer: Medicare Other | Admitting: Gynecologic Oncology

## 2021-03-02 DIAGNOSIS — C762 Malignant neoplasm of abdomen: Secondary | ICD-10-CM

## 2021-03-02 DIAGNOSIS — C5702 Malignant neoplasm of left fallopian tube: Secondary | ICD-10-CM

## 2021-03-02 DIAGNOSIS — C5700 Malignant neoplasm of unspecified fallopian tube: Secondary | ICD-10-CM

## 2021-03-02 DIAGNOSIS — Z5111 Encounter for antineoplastic chemotherapy: Secondary | ICD-10-CM

## 2021-03-02 NOTE — Telephone Encounter (Signed)
Just fax the last visit notes Thanks

## 2021-03-02 NOTE — Telephone Encounter (Signed)
Jessica Johnston from [facility] called.  Patient reached out to initiate referral into hospice.   LaTasha requesting referral faxed to [phone], along with attending provider recommendations.   Will fax referral, along with attending status reflecting Hospice to be attending.

## 2021-03-10 DIAGNOSIS — C786 Secondary malignant neoplasm of retroperitoneum and peritoneum: Secondary | ICD-10-CM | POA: Diagnosis not present

## 2021-03-12 ENCOUNTER — Encounter

## 2021-03-12 MED ORDER — ATORVASTATIN 40 MG TAB
40 mg | ORAL_TABLET | ORAL | 3 refills | Status: AC
Start: 2021-03-12 — End: ?

## 2021-03-22 ENCOUNTER — Encounter

## 2021-03-23 DIAGNOSIS — M25571 Pain in right ankle and joints of right foot: Secondary | ICD-10-CM | POA: Diagnosis not present

## 2021-03-23 DIAGNOSIS — M67911 Unspecified disorder of synovium and tendon, right shoulder: Secondary | ICD-10-CM | POA: Diagnosis not present

## 2021-03-23 MED ORDER — ALENDRONATE 35 MG TAB
35 mg | ORAL_TABLET | ORAL | 3 refills | Status: AC
Start: 2021-03-23 — End: ?

## 2021-04-01 ENCOUNTER — Ambulatory Visit: Admit: 2021-04-01 | Payer: MEDICARE | Attending: Specialist | Primary: Family Medicine

## 2021-04-01 ENCOUNTER — Ambulatory Visit: Attending: Specialist | Primary: Family Medicine

## 2021-04-01 DIAGNOSIS — G629 Polyneuropathy, unspecified: Secondary | ICD-10-CM

## 2021-04-01 DIAGNOSIS — N1831 Chronic kidney disease, stage 3a: Secondary | ICD-10-CM | POA: Diagnosis not present

## 2021-04-01 DIAGNOSIS — D6481 Anemia due to antineoplastic chemotherapy: Secondary | ICD-10-CM | POA: Diagnosis not present

## 2021-04-01 DIAGNOSIS — R252 Cramp and spasm: Secondary | ICD-10-CM | POA: Diagnosis not present

## 2021-04-01 DIAGNOSIS — T451X5A Adverse effect of antineoplastic and immunosuppressive drugs, initial encounter: Secondary | ICD-10-CM | POA: Diagnosis not present

## 2021-04-01 DIAGNOSIS — C786 Secondary malignant neoplasm of retroperitoneum and peritoneum: Secondary | ICD-10-CM | POA: Diagnosis not present

## 2021-04-01 DIAGNOSIS — E538 Deficiency of other specified B group vitamins: Secondary | ICD-10-CM | POA: Diagnosis not present

## 2021-04-01 NOTE — Progress Notes (Signed)
Pt has had symptoms since late 2021 saw PCP in jan for symptoms  pulsations start in the legs good when upright feels it in legs but when laying down will travel up to the rest of the body will take about 10-15 min  Said that it is feels like a vibrating, humming, pulsating ect.  Symptom will wake pt up at night   In conjunction pt reports that she is have fatigue

## 2021-04-01 NOTE — Progress Notes (Signed)
Neurology Consult      Subjective:      Dawn Green is a 80 y.o. female who comes in today with the following history.  Says she has this subjective note of sensory changes for some time now.  Tends to describe it as a vibration like experience in the lower extremities without a painful component and no history of falls.  Interestingly when she gets off her feet at night and lays down, she has this transference of sensibility changes in the chest wall as well?  Bowel and bladder function is good special sensory function intact except for which she suspects that as diminished smell and bulbar function intact.  Does have background diabetes for the past 3 years plus or minus.  Was looking in the imaging tab in her testing section and it showed a brain MRI done with and without contrast in 2010 for TIA that was normal.         Current Outpatient Medications   Medication Sig Dispense Refill   ??? alendronate (FOSAMAX) 35 mg tablet TAKE 1 TABLET EVERY 7 DAYS 12 Tablet 3   ??? atorvastatin (LIPITOR) 40 mg tablet TAKE 1 TABLET DAILY BEFORE BREAKFAST 90 Tablet 3   ??? Benicar HCT 20-12.5 mg per tablet TAKE 1 TABLET DAILY 90 Tablet 3   ??? ergocalciferol (Vitamin D2) 1,250 mcg (50,000 unit) capsule TAKE 1 CAPSULE EVERY 7 DAYS 12 Capsule 3   ??? Cetirizine (ZYRTEC) 10 mg cap Take  by mouth as needed.     ??? krill-om-3-dha-epa-phospho-ast (MEGARED OMEGA-3 KRILL OIL) 1,000-230-60 mg cap Take 1 Tab by mouth two (2) times a day.     ??? brimonidine-timolol (COMBIGAN) 0.2-0.5 % drop ophthalmic solution Administer 1 Drop to both eyes every twelve (12) hours.     ??? aspirin 81 mg chewable tablet Take 81 mg by mouth daily.     ??? metFORMIN ER (GLUCOPHAGE XR) 500 mg tablet Take 1 Tab by mouth daily (with dinner). (Patient not taking: Reported on 05/13/2020) 90 Tab 3   ??? anastrozole (ARIMIDEX) 1 mg tablet Take 1 mg by mouth daily. (Patient not taking: Reported on 05/13/2020) 90 Tab 1      Allergies   Allergen Reactions   ??? Metformin Nausea Only      Stomach upset   ??? Shampoo & Body Wash [Skin Cleanser,General] Itching     Dawn Green     Past Medical History:   Diagnosis Date   ??? Breast CA ([place]) 2014    Right - Lumpectomy    ??? Diabetes ([place])    ??? Glaucoma    ??? Hx of mammogram 02/05/2016    Negative per pt. Sees Dr. Laurena Bering    ??? Hyperlipemia    ??? Hypertension    ??? 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 Other ill-defined conditions(799.89) 2016    Mild URI x 4 months (allergies)    ??? Radiation therapy complication 1308   ??? Routine Papanicolaou smear 1996    Negative per pt.    ??? Skin cancer     Basal cell on left side of nose and upper lip      Past Surgical History:   Procedure Laterality Date   ??? HX BREAST BIOPSY Left     x 2 negative biopsies    ??? HX BREAST LUMPECTOMY Right 2014   ??? HX CATARACT REMOVAL Bilateral     w/ IOL implants   ??? HX TAH  AND BSO  1997    for rapidly growing fibroid (JW) - benign.  Dr. Oran Rein.   ??? HX TUBAL LIGATION  1972   ??? PR BREAST SURGERY PROCEDURE UNLISTED Right 01-01-2013    Right breast lumpectomy      Social History     Socioeconomic History   ??? Marital status: MARRIED     Spouse name: Not on file   ??? Number of children: Not on file   ??? Years of education: Not on file   ??? Highest education level: Not on file   Occupational History   ??? Not on file   Tobacco Use   ??? Smoking status: Former Smoker     Packs/day: 1.00     Years: 30.00     Pack years: 30.00     Quit date: 04/17/1996     Years since quitting: 24.9   ??? Smokeless tobacco: Never Used   ??? Tobacco comment: Never used vapor or e-cigs    Substance and Sexual Activity   ??? Alcohol use: Yes     Alcohol/week: 11.7 standard drinks     Types: 14 Standard drinks or equivalent per week     Comment: 1 gin and tonics per day   ??? Drug use: No   ??? Sexual activity: Yes     Partners: Male     Birth control/protection: Surgical   Other Topics Concern   ??? Not on file   Social History Narrative   ??? Not on file     Social Determinants of Health     Financial Resource Strain:    ???  Difficulty of Paying Living Expenses: Not on file   Food Insecurity:    ??? Worried About Running Out of Food in the Last Year: Not on file   ??? Ran Out of Food in the Last Year: Not on file   Transportation Needs:    ??? Lack of Transportation (Medical): Not on file   ??? Lack of Transportation (Non-Medical): Not on file   Physical Activity:    ??? Days of Exercise per Week: Not on file   ??? Minutes of Exercise per Session: Not on file   Stress:    ??? Feeling of Stress : Not on file   Social Connections:    ??? Frequency of Communication with Friends and Family: Not on file   ??? Frequency of Social Gatherings with Friends and Family: Not on file   ??? Attends Religious Services: Not on file   ??? Active Member of Clubs or Organizations: Not on file   ??? Attends Archivist Meetings: Not on file   ??? Marital Status: Not on file   Intimate Partner Violence:    ??? Fear of Current or Ex-Partner: Not on file   ??? Emotionally Abused: Not on file   ??? Physically Abused: Not on file   ??? Sexually Abused: Not on file   Housing Stability:    ??? Unable to Pay for Housing in the Last Year: Not on file   ??? Number of Places Lived in the Last Year: Not on file   ??? Unstable Housing in the Last Year: Not on file      Family History   Problem Relation Age of Onset   ??? Cancer Mother 63        Ovarian - Passed away in 1965/01/01   ??? Ovarian Cancer Mother 25   ??? Other Son 19  Pancreatitis    ??? Cancer Paternal Aunt 80        Pancreatic      Visit Vitals  BP 120/76 (BP 1 Location: Left arm, BP Patient Position: Sitting, BP Cuff Size: Adult)   Pulse 66   Resp 16   SpO2 97%        Review of Systems:   A comprehensive review of systems was negative except for that written in the HPI.      Neuro Exam:     Appearance:  The patient is well developed, well nourished, provides a coherent history and is in no acute distress.   Mental Status: Oriented to time, place and person. Mood and affect appropriate.   Cranial Nerves:   Intact visual fields. Fundi are  benign. PERLA, EOM's full, no nystagmus, no ptosis. Facial sensation is normal. Corneal reflexes are intact. Facial movement is symmetric. Hearing is normal bilaterally. Palate is midline with normal sternocleidomastoid and trapezius muscles are normal. Tongue is midline.   Motor:  5/5 strength in upper and lower proximal and distal muscles. Normal bulk and tone. No fasciculations.   Reflexes:   Deep tendon reflexes 0-2+/4 and symmetrical.   Sensory:    Diminished distally to touch, pinprick and vibration.  Position sense intact.   Gait:  Normal gait.  Romberg revealed patient comfortable and no readily defined truncal sway etc.   Tremor:   No tremor noted.   Cerebellar:  No cerebellar signs present.   Neurovascular:  Normal heart sounds and regular rhythm, peripheral pulses intact, and no carotid bruits.  Toes downgoing no clonus no Hoffman's.  Straight leg raising -90 degrees.  Lhermitte's negative.             Assessment:   Neuropathy.  This is my closest approximation translating chief complaint with altered sensibility and such into a working diagnosis.   Patient says thank you but no thank you for labs and EMG and nerve conduction assessment.  Apparently her spouse had an EMG and nerve conduction and she has very vivid recall of the testing and his experience etc.Lab work would have included ancillary investigations to deficiency states and related items such as sed rate B12 folate TSH SPEP and ANA.  Beyond that medicine to counter the altered sensibility could include nortriptyline Cymbalta doxepin etc.      Plan:   Revisit as needed.  Good luck.  Signed by :  Dessa Phi IV MD

## 2021-04-05 ENCOUNTER — Encounter

## 2021-04-05 MED ORDER — ERGOCALCIFEROL (VITAMIN D2) 50,000 UNIT CAP
1250 mcg (50,000 unit) | ORAL_CAPSULE | ORAL | 3 refills | Status: DC
Start: 2021-04-05 — End: 2021-07-15

## 2021-04-12 DIAGNOSIS — R82998 Other abnormal findings in urine: Secondary | ICD-10-CM | POA: Diagnosis not present

## 2021-04-12 DIAGNOSIS — Z0000 Encounter for general adult medical examination without abnormal findings: Secondary | ICD-10-CM | POA: Diagnosis not present

## 2021-04-12 DIAGNOSIS — E785 Hyperlipidemia, unspecified: Secondary | ICD-10-CM | POA: Diagnosis not present

## 2021-04-12 DIAGNOSIS — E559 Vitamin D deficiency, unspecified: Secondary | ICD-10-CM | POA: Diagnosis not present

## 2021-04-16 ENCOUNTER — Encounter: Payer: Self-pay | Admitting: Hematology and Oncology

## 2021-04-21 ENCOUNTER — Ambulatory Visit: Admit: 2021-04-21 | Discharge: 2021-04-21 | Payer: MEDICARE | Attending: Family Medicine | Primary: Family Medicine

## 2021-04-21 ENCOUNTER — Ambulatory Visit: Attending: Family Medicine | Primary: Family Medicine

## 2021-04-21 DIAGNOSIS — I10 Essential (primary) hypertension: Secondary | ICD-10-CM

## 2021-04-21 DIAGNOSIS — E119 Type 2 diabetes mellitus without complications: Secondary | ICD-10-CM

## 2021-04-21 MED ORDER — AMITRIPTYLINE 10 MG TAB
10 mg | ORAL_TABLET | Freq: Every evening | ORAL | 1 refills | Status: DC
Start: 2021-04-21 — End: 2021-05-19

## 2021-04-21 MED ORDER — OLMESARTAN 20 MG TAB
20 mg | ORAL_TABLET | Freq: Every day | ORAL | 1 refills | Status: DC
Start: 2021-04-21 — End: 2021-05-19

## 2021-04-21 NOTE — Addendum Note (Signed)
Addendum Note  by Sonny Dandy at 04/21/21 1000                Author: Sonny Dandy  Service: --  Author Type: Technician       Filed: 04/21/21 1047  Encounter Date: 04/21/2021  Status: Signed          Editor: Sonny Dandy (Technician)          Addended by: Sonny Dandy on: 04/21/2021 10:47 AM    Modules accepted: Orders

## 2021-04-21 NOTE — Progress Notes (Signed)
Called and spoke with patient in reference to labs. Patient verbalized understanding. She states that she already has a follow up apt scheduled for 05/19/21 with Dr. Hillery Hunter and will get labs done at that time.

## 2021-04-21 NOTE — Progress Notes (Signed)
Progress Note    she is a 80 y.o. year old female who presents for evalution.    Subjective:     Pt is currently on amoxil for toothache and is waiting to see dentist for toothache.  She is having diarrhea.  Discussed starting a probiotic for now.    NIDDM and states stopped metformin due to abd cramps with extended release.  Currently diet controlled.  On vit d 50k weekly.      BP low today on Benicar HCT will reduce.  She will start tracking BP.      Pt with neuropathy saw neurology did not want testing and wants to start something for this.  She is also having diff staying asleep due to her neuropathy symptoms.    Reviewed PmHx, RxHx, FmHx, SocHx, AllgHx and updated and dated in the chart.    Review of Systems - negative except as listed above in the HPI    Objective:     Vitals:    04/21/21 0958   BP: (!) 85/54   Pulse: 73   Resp: 16   Temp: 98.4 ??F (36.9 ??C)   TempSrc: Oral   SpO2: 96%   Weight: 145 lb (65.8 kg)   Height: 5\' 3"  (1.6 m)       Current Outpatient Medications   Medication Sig   ??? olmesartan (BENICAR) 20 mg tablet Take 1 Tablet by mouth daily.   ??? amitriptyline (ELAVIL) 10 mg tablet Take 1 Tablet by mouth nightly.   ??? ergocalciferol (Vitamin D2) 1,250 mcg (50,000 unit) capsule TAKE 1 CAPSULE EVERY 7 DAYS   ??? alendronate (FOSAMAX) 35 mg tablet TAKE 1 TABLET EVERY 7 DAYS   ??? atorvastatin (LIPITOR) 40 mg tablet TAKE 1 TABLET DAILY BEFORE BREAKFAST   ??? krill-om-3-dha-epa-phospho-ast (MEGARED OMEGA-3 KRILL OIL) 1,000-230-60 mg cap Take 1 Tab by mouth two (2) times a day.   ??? brimonidine-timolol (COMBIGAN) 0.2-0.5 % drop ophthalmic solution Administer 1 Drop to both eyes every twelve (12) hours.   ??? aspirin 81 mg chewable tablet Take 81 mg by mouth daily.   ??? Cetirizine (ZYRTEC) 10 mg cap Take  by mouth as needed. (Patient not taking: Reported on 04/21/2021)     No current facility-administered medications for this visit.       Physical Examination: General appearance - alert, well appearing, and  in no distress  Mental status - alert, oriented to person, place, and time  Ears - bilateral TM's and external ear canals normal  Chest - clear to auscultation, no wheezes, rales or rhonchi, symmetric air entry  Heart - normal rate, regular rhythm, normal S1, S2, no murmurs, rubs, clicks or gallops      Assessment/ Plan:   Diagnoses and all orders for this visit:    1. Essential hypertension  -     METABOLIC PANEL, COMPREHENSIVE; Future  -     olmesartan (BENICAR) 20 mg tablet; Take 1 Tablet by mouth daily.  -     amitriptyline (ELAVIL) 10 mg tablet; Take 1 Tablet by mouth nightly.    2. Controlled type 2 diabetes mellitus without complication, without long-term current use of insulin ([place])  -     LIPID PANEL; Future  -     METABOLIC PANEL, COMPREHENSIVE; Future  -     HEMOGLOBIN A1C WITH EAG; Future  -     MICROALBUMIN, UR, RAND W/ MICROALB/CREAT RATIO; Future    3. Mixed hyperlipidemia  -     LIPID PANEL;  Future  -     METABOLIC PANEL, COMPREHENSIVE; Future    4. Neuropathy  -     amitriptyline (ELAVIL) 10 mg tablet; Take 1 Tablet by mouth nightly.    5. Vitamin D deficiency  -     VITAMIN D, 25 HYDROXY; Future       Follow-up and Dispositions    ?? Return in about 4 weeks (around 05/19/2021), or if symptoms worsen or fail to improve, for med eval.           I have discussed the diagnosis with the patient and the intended plan as seen in the above orders.  The patient has received an after-visit summary and questions were answered concerning future plans. Pt conveyed understanding of plan.    Medication Side Effects and Warnings were discussed with patient    An electronic signature was used to authenticate this note  Esau Grew, DO

## 2021-04-21 NOTE — Progress Notes (Signed)
You have elevated levels of vitamin D so stop the supplement immediately to prevent toxicity.  Your kidney function is also off again.  I want your Vitamin D and kidney function rechecked in 3 weeks.  I will place the order, you can schedule a lab only appt here or we can send the lab slip to a [facility].    Diabetes remains well controlled re check months same with cholesterol

## 2021-04-21 NOTE — Progress Notes (Signed)
 Chief Complaint   Patient presents with   . Follow-up   . Labs     Patient presents in office today for 6 month f/u and fasting labs.  Has c/o weight loss and feeling fatigued.  Also has c/o waking up in the middle of the night and is unable to get back to sleep.  Would like to discuss starting amitriptyline for her neuropathy.  No other concerns.    1. Have you been to the ER, urgent care clinic since your last visit?  Hospitalized since your last visit?No    2. Have you seen or consulted any other health care providers outside of the [facility] since your last visit?  Include any pap smears or colon screening. No    Learning Assessment 05/06/2013   PRIMARY LEARNER Patient   HIGHEST LEVEL OF EDUCATION - PRIMARY LEARNER  SOME COLLEGE   BARRIERS PRIMARY LEARNER NONE   CO-LEARNER CAREGIVER No   PRIMARY LANGUAGE ENGLISH   INTERPRETER NEED No   LEARNER PREFERENCE PRIMARY READING     DEMONSTRATION     LISTENING   LEARNING SPECIAL TOPICS n/a   ANSWERED BY patient   RELATIONSHIP SELF

## 2021-04-22 ENCOUNTER — Encounter

## 2021-04-22 LAB — VITAMIN D 25 HYDROXY: Vit D, 25-Hydroxy: 112.2 ng/mL — ABNORMAL HIGH (ref 30–100)

## 2021-04-22 LAB — COMPREHENSIVE METABOLIC PANEL
ALT: 18 U/L (ref 12–78)
AST: 15 U/L (ref 15–37)
Albumin/Globulin Ratio: 1 — ABNORMAL LOW (ref 1.1–2.2)
Albumin: 3.5 g/dL (ref 3.5–5.0)
Alkaline Phosphatase: 75 U/L (ref 45–117)
Anion Gap: 7 mmol/L (ref 5–15)
BUN: 35 MG/DL — ABNORMAL HIGH (ref 6–20)
Bun/Cre Ratio: 28 — ABNORMAL HIGH (ref 12–20)
CO2: 28 mmol/L (ref 21–32)
Calcium: 9.2 MG/DL (ref 8.5–10.1)
Chloride: 104 mmol/L (ref 97–108)
Creatinine: 1.27 MG/DL — ABNORMAL HIGH (ref 0.55–1.02)
EGFR IF NonAfrican American: 41 mL/min/{1.73_m2} — ABNORMAL LOW (ref >60)
GFR African American: 49 mL/min/{1.73_m2} — ABNORMAL LOW (ref >60)
Globulin: 3.5 g/dL (ref 2.0–4.0)
Glucose: 136 mg/dL — ABNORMAL HIGH (ref 65–100)
Potassium: 4.1 mmol/L (ref 3.5–5.1)
Sodium: 139 mmol/L (ref 136–145)
Total Bilirubin: 0.4 MG/DL (ref 0.2–1.0)
Total Protein: 7 g/dL (ref 6.4–8.2)

## 2021-04-22 LAB — LIPID PANEL
CHOL/HDL Ratio: 4.7 (ref 0.0–5.0)
Chol/HDL Ratio: 4.7 (ref 0.0–5.0)
Cholesterol, Total: 154 MG/DL (ref <200)
Cholesterol, total: 154 MG/DL (ref <200)
HDL Cholesterol: 33 MG/DL
HDL: 33 MG/DL
LDL Calculated: 62 MG/DL (ref 0–100)
LDL, calculated: 62 MG/DL (ref 0–100)
Triglyceride: 295 MG/DL — ABNORMAL HIGH (ref <150)
Triglycerides: 295 MG/DL — ABNORMAL HIGH (ref <150)
VLDL Cholesterol Calculated: 59 MG/DL
VLDL, calculated: 59 MG/DL

## 2021-04-22 LAB — MICROALBUMIN / CREATININE URINE RATIO
Creatinine, Ur: 101 mg/dL
Microalb, Ur: 0.52 MG/DL
Microalbumin Creatinine Ratio: 5 mg/g (ref 0–30)

## 2021-04-22 LAB — HEMOGLOBIN A1C W/EAG
Hemoglobin A1C: 6.1 % — ABNORMAL HIGH (ref 4.0–5.6)
eAG: 128 mg/dL

## 2021-04-22 LAB — METABOLIC PANEL, COMPREHENSIVE
A-G Ratio: 1 — ABNORMAL LOW (ref 1.1–2.2)
ALT (SGPT): 18 U/L (ref 12–78)
AST (SGOT): 15 U/L (ref 15–37)
Albumin: 3.5 g/dL (ref 3.5–5.0)
Alk. phosphatase: 75 U/L (ref 45–117)
Anion gap: 7 mmol/L (ref 5–15)
BUN/Creatinine ratio: 28 — ABNORMAL HIGH (ref 12–20)
BUN: 35 MG/DL — ABNORMAL HIGH (ref 6–20)
Bilirubin, total: 0.4 MG/DL (ref 0.2–1.0)
CO2: 28 mmol/L (ref 21–32)
Calcium: 9.2 MG/DL (ref 8.5–10.1)
Chloride: 104 mmol/L (ref 97–108)
Creatinine: 1.27 MG/DL — ABNORMAL HIGH (ref 0.55–1.02)
GFR est AA: 49 mL/min/{1.73_m2} — ABNORMAL LOW (ref >60)
GFR est non-AA: 41 mL/min/{1.73_m2} — ABNORMAL LOW (ref >60)
Globulin: 3.5 g/dL (ref 2.0–4.0)
Glucose: 136 mg/dL — ABNORMAL HIGH (ref 65–100)
Potassium: 4.1 mmol/L (ref 3.5–5.1)
Protein, total: 7 g/dL (ref 6.4–8.2)
Sodium: 139 mmol/L (ref 136–145)

## 2021-04-22 LAB — HEMOGLOBIN A1C WITH EAG
Est. average glucose: 128 mg/dL
Hemoglobin A1c: 6.1 % — ABNORMAL HIGH (ref 4.0–5.6)

## 2021-04-22 LAB — MICROALBUMIN, UR, RAND W/ MICROALB/CREAT RATIO
Creatinine, urine random: 101 mg/dL
Microalbumin,urine random: 0.52 MG/DL
Microalbumin/Creat ratio (mg/g creat): 5 mg/g (ref 0–30)

## 2021-04-22 LAB — VITAMIN D, 25 HYDROXY: Vitamin D 25-Hydroxy: 112.2 ng/mL — ABNORMAL HIGH (ref 30–100)

## 2021-04-28 ENCOUNTER — Inpatient Hospital Stay: Admit: 2021-04-28 | Payer: MEDICARE | Attending: Family Medicine | Primary: Family Medicine

## 2021-04-28 DIAGNOSIS — Z1231 Encounter for screening mammogram for malignant neoplasm of breast: Secondary | ICD-10-CM

## 2021-05-19 ENCOUNTER — Ambulatory Visit: Admit: 2021-05-19 | Discharge: 2021-05-19 | Payer: MEDICARE | Attending: Family Medicine | Primary: Family Medicine

## 2021-05-19 ENCOUNTER — Ambulatory Visit: Attending: Family Medicine | Primary: Family Medicine

## 2021-05-19 DIAGNOSIS — R7989 Other specified abnormal findings of blood chemistry: Secondary | ICD-10-CM

## 2021-05-19 DIAGNOSIS — E559 Vitamin D deficiency, unspecified: Secondary | ICD-10-CM

## 2021-05-19 MED ORDER — AMITRIPTYLINE 10 MG TAB
10 mg | ORAL_TABLET | Freq: Every evening | ORAL | 3 refills | Status: AC
Start: 2021-05-19 — End: ?

## 2021-05-19 MED ORDER — OLMESARTAN 20 MG TAB
20 mg | ORAL_TABLET | Freq: Every day | ORAL | 3 refills | Status: AC
Start: 2021-05-19 — End: ?

## 2021-05-19 MED ORDER — BENZONATATE 200 MG CAP
200 mg | ORAL_CAPSULE | Freq: Every evening | ORAL | 5 refills | Status: DC | PRN
Start: 2021-05-19 — End: 2021-07-15

## 2021-05-19 NOTE — Progress Notes (Signed)
Progress Note    she is a 80 y.o. year old female who presents for evalution.    Subjective:     Pt states she has had a cough since July, feels like mucous stuck in her throat.  Using otc cough syrup and helps temp.  Tried mucinex did not help.      Pt also with elevated vitamin D and she has since stopped will recheck level today.    Cr slightly elevated off hctz now BP well controlled on Benicar.   Elavil working well for neuropathy.  She would like to continue current dose.    Reviewed PmHx, RxHx, FmHx, SocHx, AllgHx and updated and dated in the chart.    Review of Systems - negative except as listed above in the HPI    Objective:     Vitals:    05/19/21 1018   BP: 137/69   Pulse: 84   Resp: 16   Temp: 98.2 ??F (36.8 ??C)   TempSrc: Oral   SpO2: 94%   Weight: 145 lb (65.8 kg)   Height: 5\' 3"  (1.6 m)       Current Outpatient Medications   Medication Sig    olmesartan (BENICAR) 20 mg tablet Take 1 Tablet by mouth in the morning.    amitriptyline (ELAVIL) 10 mg tablet Take 1 Tablet by mouth nightly.    benzonatate (TESSALON) 200 mg capsule Take 1 Capsule by mouth nightly as needed for Cough.    ergocalciferol (Vitamin D2) 1,250 mcg (50,000 unit) capsule TAKE 1 CAPSULE EVERY 7 DAYS    alendronate (FOSAMAX) 35 mg tablet TAKE 1 TABLET EVERY 7 DAYS    atorvastatin (LIPITOR) 40 mg tablet TAKE 1 TABLET DAILY BEFORE BREAKFAST    Cetirizine (ZYRTEC) 10 mg cap Take  by mouth as needed. (Patient not taking: Reported on 04/21/2021)    krill-om-3-dha-epa-phospho-ast (MEGARED OMEGA-3 KRILL OIL) 1,000-230-60 mg cap Take 1 Tab by mouth two (2) times a day.    brimonidine-timolol (COMBIGAN) 0.2-0.5 % drop ophthalmic solution Administer 1 Drop to both eyes every twelve (12) hours.    aspirin 81 mg chewable tablet Take 81 mg by mouth daily.     No current facility-administered medications for this visit.       Physical Examination: General appearance - alert, well appearing, and in no distress  Chest - clear to auscultation, no  wheezes, rales or rhonchi, symmetric air entry  Heart - normal rate, regular rhythm, normal S1, S2, no murmurs, rubs, clicks or gallops      Assessment/ Plan:   Diagnoses and all orders for this visit:    1. Hypovitaminosis D  -     VITAMIN D, 25 HYDROXY; Future    2. Elevated serum creatinine  -     METABOLIC PANEL, BASIC; Future    3. Post-nasal drip  Zyrtec nightly up to 20mg   Tessalon sent in for cough  4. Essential hypertension  -     olmesartan (BENICAR) 20 mg tablet; Take 1 Tablet by mouth in the morning.    5. Neuropathy  -     amitriptyline (ELAVIL) 10 mg tablet; Take 1 Tablet by mouth nightly.     Follow-up and Dispositions    Return if symptoms worsen or fail to improve.           I have discussed the diagnosis with the patient and the intended plan as seen in the above orders.  The patient has received an after-visit summary and questions  were answered concerning future plans. Pt conveyed understanding of plan.    Medication Side Effects and Warnings were discussed with patient    An electronic signature was used to authenticate this note  Esau Grew, DO

## 2021-05-19 NOTE — Progress Notes (Signed)
Chief Complaint   Patient presents with    Blood Pressure Check    Labs     Patient presents in office today for BP check and fasting labs.  Has c/o a cough for a month.  Has been using OTC cough syrup with no relief.  No other concerns.    1. Have you been to the ER, urgent care clinic since your last visit?  Hospitalized since your last visit?No    2. Have you seen or consulted any other health care providers outside of the [place] since your last visit?  Include any pap smears or colon screening. No    Learning Assessment 05/06/2013   PRIMARY LEARNER Patient   HIGHEST LEVEL OF EDUCATION - PRIMARY LEARNER  SOME COLLEGE   BARRIERS PRIMARY LEARNER NONE   CO-LEARNER CAREGIVER No   PRIMARY LANGUAGE ENGLISH   INTERPRETER NEED No   LEARNER PREFERENCE PRIMARY READING     DEMONSTRATION     LISTENING   LEARNING SPECIAL TOPICS n/a   ANSWERED BY patient   RELATIONSHIP SELF

## 2021-05-19 NOTE — Progress Notes (Signed)
Kidney function is back to being completely normal.  Your vitamin D level is also in the normal range now.  Would recommend just taking a multivitamin with vitamin D in it to help maintain your levels in the normal range

## 2021-05-19 NOTE — Addendum Note (Signed)
Addendum Note  by Sonny Dandy at 05/19/21 1030                Author: Sonny Dandy  Service: --  Author Type: Technician       Filed: 05/19/21 1122  Encounter Date: 05/19/2021  Status: Signed          Editor: Sonny Dandy (Technician)          Addended by: Sonny Dandy on: 05/19/2021 11:22 AM    Modules accepted: Orders

## 2021-05-20 LAB — BASIC METABOLIC PANEL
Anion Gap: 8 mmol/L (ref 5–15)
BUN: 11 MG/DL (ref 6–20)
Bun/Cre Ratio: 15 (ref 12–20)
CO2: 25 mmol/L (ref 21–32)
Calcium: 8.8 MG/DL (ref 8.5–10.1)
Chloride: 108 mmol/L (ref 97–108)
Creatinine: 0.73 MG/DL (ref 0.55–1.02)
EGFR IF NonAfrican American: >60 mL/min/{1.73_m2} (ref >60)
GFR African American: >60 mL/min/{1.73_m2} (ref >60)
Glucose: 109 mg/dL — ABNORMAL HIGH (ref 65–100)
Potassium: 3.9 mmol/L (ref 3.5–5.1)
Sodium: 141 mmol/L (ref 136–145)

## 2021-05-20 LAB — VITAMIN D 25 HYDROXY: Vit D, 25-Hydroxy: 96.4 ng/mL (ref 30–100)

## 2021-05-20 LAB — METABOLIC PANEL, BASIC
Anion gap: 8 mmol/L (ref 5–15)
BUN/Creatinine ratio: 15 (ref 12–20)
BUN: 11 MG/DL (ref 6–20)
CO2: 25 mmol/L (ref 21–32)
Calcium: 8.8 MG/DL (ref 8.5–10.1)
Chloride: 108 mmol/L (ref 97–108)
Creatinine: 0.73 MG/DL (ref 0.55–1.02)
GFR est AA: >60 mL/min/{1.73_m2} (ref >60)
GFR est non-AA: >60 mL/min/{1.73_m2} (ref >60)
Glucose: 109 mg/dL — ABNORMAL HIGH (ref 65–100)
Potassium: 3.9 mmol/L (ref 3.5–5.1)
Sodium: 141 mmol/L (ref 136–145)

## 2021-05-20 LAB — VITAMIN D, 25 HYDROXY: Vitamin D 25-Hydroxy: 96.4 ng/mL (ref 30–100)

## 2021-05-25 DIAGNOSIS — E785 Hyperlipidemia, unspecified: Secondary | ICD-10-CM | POA: Diagnosis not present

## 2021-06-01 DIAGNOSIS — Z23 Encounter for immunization: Secondary | ICD-10-CM | POA: Diagnosis not present

## 2021-06-01 DIAGNOSIS — F4320 Adjustment disorder, unspecified: Secondary | ICD-10-CM | POA: Diagnosis not present

## 2021-06-01 DIAGNOSIS — M5386 Other specified dorsopathies, lumbar region: Secondary | ICD-10-CM | POA: Diagnosis not present

## 2021-06-01 DIAGNOSIS — M8580 Other specified disorders of bone density and structure, unspecified site: Secondary | ICD-10-CM | POA: Diagnosis not present

## 2021-06-01 DIAGNOSIS — E538 Deficiency of other specified B group vitamins: Secondary | ICD-10-CM | POA: Diagnosis not present

## 2021-06-01 DIAGNOSIS — K469 Unspecified abdominal hernia without obstruction or gangrene: Secondary | ICD-10-CM | POA: Diagnosis not present

## 2021-06-01 DIAGNOSIS — C5700 Malignant neoplasm of unspecified fallopian tube: Secondary | ICD-10-CM | POA: Diagnosis not present

## 2021-06-01 DIAGNOSIS — J069 Acute upper respiratory infection, unspecified: Secondary | ICD-10-CM | POA: Diagnosis not present

## 2021-06-01 DIAGNOSIS — E785 Hyperlipidemia, unspecified: Secondary | ICD-10-CM | POA: Diagnosis not present

## 2021-06-01 DIAGNOSIS — Z0000 Encounter for general adult medical examination without abnormal findings: Secondary | ICD-10-CM | POA: Diagnosis not present

## 2021-06-01 DIAGNOSIS — Z1331 Encounter for screening for depression: Secondary | ICD-10-CM | POA: Diagnosis not present

## 2021-06-01 DIAGNOSIS — R9431 Abnormal electrocardiogram [ECG] [EKG]: Secondary | ICD-10-CM | POA: Diagnosis not present

## 2021-06-01 DIAGNOSIS — M25531 Pain in right wrist: Secondary | ICD-10-CM | POA: Diagnosis not present

## 2021-06-11 ENCOUNTER — Encounter

## 2021-06-11 ENCOUNTER — Inpatient Hospital Stay
Admit: 2021-06-11 | Discharge: 2021-06-11 | Disposition: A | Discharge status: Home / Self Care | Payer: MEDICARE | Attending: Emergency Medicine

## 2021-06-11 ENCOUNTER — Emergency Department: Admit: 2021-06-11 | Payer: MEDICARE | Primary: Family Medicine

## 2021-06-11 DIAGNOSIS — R059 Cough, unspecified: Secondary | ICD-10-CM

## 2021-06-11 LAB — CBC WITH AUTO DIFFERENTIAL
Basophils %: 1 % (ref 0–1)
Basophils Absolute: 0.1 10*3/uL (ref 0.0–0.1)
Eosinophils %: 7 % (ref 0–7)
Eosinophils Absolute: 0.5 10*3/uL — ABNORMAL HIGH (ref 0.0–0.4)
Granulocyte Absolute Count: 0 10*3/uL (ref 0.00–0.04)
Hematocrit: 33.4 % — ABNORMAL LOW (ref 35.0–47.0)
Hemoglobin: 10.5 g/dL — ABNORMAL LOW (ref 11.5–16.0)
Immature Granulocytes: 0 % (ref 0.0–0.5)
Lymphocytes %: 27 % (ref 12–49)
Lymphocytes Absolute: 1.9 10*3/uL (ref 0.8–3.5)
MCH: 27 PG (ref 26.0–34.0)
MCHC: 31.4 g/dL (ref 30.0–36.5)
MCV: 85.9 FL (ref 80.0–99.0)
MPV: 9.4 FL (ref 8.9–12.9)
Monocytes %: 15 % — ABNORMAL HIGH (ref 5–13)
Monocytes Absolute: 1 10*3/uL (ref 0.0–1.0)
NRBC Absolute: 0 10*3/uL (ref 0.00–0.01)
Neutrophils %: 49 % (ref 32–75)
Neutrophils Absolute: 3.5 10*3/uL (ref 1.8–8.0)
Nucleated RBCs: 0 PER 100 WBC
Platelets: 326 10*3/uL (ref 150–400)
RBC: 3.89 M/uL (ref 3.80–5.20)
RDW: 13.9 % (ref 11.5–14.5)
WBC: 7 10*3/uL (ref 3.6–11.0)

## 2021-06-11 LAB — BASIC METABOLIC PANEL
Anion Gap: 9 mmol/L (ref 5–15)
BUN: 8 MG/DL (ref 8–23)
Bun/Cre Ratio: 13 (ref 12–20)
CO2: 27 mmol/L (ref 22–29)
Calcium: 9 MG/DL (ref 8.8–10.2)
Chloride: 103 mmol/L (ref 98–107)
Creatinine: 0.62 MG/DL (ref 0.50–0.90)
EGFR IF NonAfrican American: >60 mL/min/{1.73_m2} (ref >60)
GFR African American: >60 mL/min/{1.73_m2} (ref >60)
Glucose: 124 mg/dL — ABNORMAL HIGH (ref 65–100)
Potassium: 4.1 mmol/L (ref 3.5–5.1)
Sodium: 139 mmol/L (ref 136–145)

## 2021-06-11 LAB — METABOLIC PANEL, BASIC
Anion gap: 9 mmol/L (ref 5–15)
BUN/Creatinine ratio: 13 (ref 12–20)
BUN: 8 MG/DL (ref 8–23)
CO2: 27 mmol/L (ref 22–29)
Calcium: 9 MG/DL (ref 8.8–10.2)
Chloride: 103 mmol/L (ref 98–107)
Creatinine: 0.62 MG/DL (ref 0.50–0.90)
GFR est AA: >60 mL/min/{1.73_m2} (ref >60)
GFR est non-AA: >60 mL/min/{1.73_m2} (ref >60)
Glucose: 124 mg/dL — ABNORMAL HIGH (ref 65–100)
Potassium: 4.1 mmol/L (ref 3.5–5.1)
Sodium: 139 mmol/L (ref 136–145)

## 2021-06-11 LAB — CBC WITH AUTOMATED DIFF
ABS. BASOPHILS: 0.1 10*3/uL (ref 0.0–0.1)
ABS. EOSINOPHILS: 0.5 10*3/uL — ABNORMAL HIGH (ref 0.0–0.4)
ABS. IMM. GRANS.: 0 10*3/uL (ref 0.00–0.04)
ABS. LYMPHOCYTES: 1.9 10*3/uL (ref 0.8–3.5)
ABS. MONOCYTES: 1 10*3/uL (ref 0.0–1.0)
ABS. NEUTROPHILS: 3.5 10*3/uL (ref 1.8–8.0)
ABSOLUTE NRBC: 0 10*3/uL (ref 0.00–0.01)
BASOPHILS: 1 % (ref 0–1)
EOSINOPHILS: 7 % (ref 0–7)
HCT: 33.4 % — ABNORMAL LOW (ref 35.0–47.0)
HGB: 10.5 g/dL — ABNORMAL LOW (ref 11.5–16.0)
IMMATURE GRANULOCYTES: 0 % (ref 0.0–0.5)
LYMPHOCYTES: 27 % (ref 12–49)
MCH: 27 PG (ref 26.0–34.0)
MCHC: 31.4 g/dL (ref 30.0–36.5)
MCV: 85.9 FL (ref 80.0–99.0)
MONOCYTES: 15 % — ABNORMAL HIGH (ref 5–13)
MPV: 9.4 FL (ref 8.9–12.9)
NEUTROPHILS: 49 % (ref 32–75)
NRBC: 0 PER 100 WBC
PLATELET: 326 10*3/uL (ref 150–400)
RBC: 3.89 M/uL (ref 3.80–5.20)
RDW: 13.9 % (ref 11.5–14.5)
WBC: 7 10*3/uL (ref 3.6–11.0)

## 2021-06-11 MED ORDER — CODEINE-GUAIFENESIN 10 MG-100 MG/5 ML ORAL LIQUID
100-10 mg/5 mL | Freq: Three times a day (TID) | ORAL | 0 refills | Status: AC | PRN
Start: 2021-06-11 — End: 2021-06-14

## 2021-06-11 MED ORDER — IOPAMIDOL 61 % IV SOLN
61 % | Freq: Once | INTRAVENOUS | Status: AC
Start: 2021-06-11 — End: 2021-06-11
  Administered 2021-06-11: 15:00:00 via INTRAVENOUS

## 2021-06-11 MED FILL — ISOVUE-300  61 % INTRAVENOUS SOLUTION: 300 mg iodine /mL (61 %) | INTRAVENOUS | Qty: 100

## 2021-06-11 NOTE — ED Notes (Signed)
 Pt arrives with the c.c. of a cough that has been going on since July; pt has seen primary care for problem and been started on medication.  Pt denies any other symptoms; just is tired of cough.

## 2021-06-11 NOTE — Progress Notes (Signed)
Discussed with pt and husband.  Ordered bx for lung mass

## 2021-06-11 NOTE — ED Provider Notes (Signed)
80 year old female history of breast cancer chest post lobectomy, diabetes, hypertension, hyperlipidemia presents to the emergency department with her husband with concern for cough which has been going on for about 2 months.  She is seen her primary doctor twice and prescribed Tessalon and Zyrtec without relief.  No fevers or chills.  She feels somewhat short of breath when she coughs a lot.  No chest pain.  No injury.    The history is provided by the patient and medical records.   Cough  This is a chronic problem. The current episode started more than 1 week ago. The problem occurs constantly. The problem has not changed since onset.The cough is Non-productive. There has been no fever. Associated symptoms include shortness of breath. Pertinent negatives include no chest pain, no headaches, no sore throat, no myalgias, no nausea and no vomiting.      Past Medical History:   Diagnosis Date    Breast CA [facility]) Dec 29, 2012    Right - Lumpectomy     Diabetes ([place])     Glaucoma     Hx of mammogram 02/05/2016    Negative per pt. Sees Dr. 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     Skin cancer     Basal cell on left side of nose and upper lip       Past Surgical History:   Procedure Laterality Date    HX BREAST BIOPSY Left     x 2 negative biopsies     HX BREAST LUMPECTOMY Right December 29, 2012    HX CATARACT REMOVAL Bilateral     w/ IOL implants    HX TAH AND BSO  1995-12-30    for rapidly growing fibroid (JW) - benign.  Dr. Oran Rein.    [facility] Patter TUBAL LIGATION  1972    PR BREAST SURGERY PROCEDURE UNLISTED Right 12-29-2012    Right breast lumpectomy         Family History:   Problem Relation Age of Onset    Cancer Mother 78        Ovarian - Passed away in 12/29/64    Ovarian Cancer Mother 16    Other Son 72        Pancreatitis      Cancer Paternal Aunt 80        Pancreatic       Social History     Socioeconomic History    Marital status: MARRIED     Spouse name: Not on file    Number of children: Not on file    Years of education: Not on file    Highest education level: Not on file   Occupational History    Not on file   Tobacco Use    Smoking status: Former     Packs/day: 1.00     Years: 30.00     Pack years: 30.00     Types: Cigarettes     Quit date: 04/17/1996     Years since quitting: 25.1    Smokeless tobacco: Never    Tobacco comments:     Never used vapor or e-cigs    Substance and Sexual Activity    Alcohol use:  Yes     Alcohol/week: 11.7 standard drinks     Types: 14 Standard drinks or equivalent per week     Comment: 1 gin and tonics per day    Drug use: No    Sexual activity: Yes     Partners: Male     Birth control/protection: Surgical   Other Topics Concern    Not on file   Social History Narrative    Not on file     Social Determinants of Health     Financial Resource Strain: Unknown    Difficulty of Paying Living Expenses: Patient refused   Food Insecurity: Unknown    Worried About Charity fundraiser in the Last Year: Patient refused    Arboriculturist in the Last Year: Patient refused   Transportation Needs: Not on file   Physical Activity: Not on file   Stress: Not on file   Social Connections: Not on file   Intimate Partner Violence: Not on file   Housing Stability: Not on file         ALLERGIES: Metformin and Shampoo & body wash [skin cleanser,general]    Review of Systems   Constitutional:  Negative for fatigue and fever.   HENT:  Negative for sneezing and sore throat.    Respiratory:  Positive for cough and shortness of breath.    Cardiovascular:  Negative for chest pain and leg swelling.   Gastrointestinal:  Negative for abdominal pain, diarrhea, nausea and vomiting.   Genitourinary:  Negative for difficulty urinating and dysuria.   Musculoskeletal:  Negative for arthralgias and myalgias.   Skin:  Negative for color change  and rash.   Neurological:  Negative for weakness and headaches.   Psychiatric/Behavioral:  Negative for agitation and behavioral problems.      Vitals:    06/11/21 0853   BP: (!) 149/74   Pulse: 82   Resp: 18   Temp: 98.2 ??F (36.8 ??C)   SpO2: 96%   Weight: 65.8 kg (145 lb)   Height: 5\' 3"  (1.6 m)            Physical Exam  Vitals and nursing note reviewed.   Constitutional:       General: She is not in acute distress.     Appearance: Normal appearance. She is well-developed. She is not ill-appearing, toxic-appearing or diaphoretic.   HENT:      Head: Normocephalic and atraumatic.      Nose: Nose normal.      Mouth/Throat:      Mouth: Mucous membranes are moist.      Pharynx: Oropharynx is clear.   Eyes:      Extraocular Movements: Extraocular movements intact.      Conjunctiva/sclera: Conjunctivae normal.      Pupils: Pupils are equal, round, and reactive to light.   Cardiovascular:      Rate and Rhythm: Normal rate and regular rhythm.      Pulses: Normal pulses.      Heart sounds: Normal heart sounds.   Pulmonary:      Effort: Pulmonary effort is normal. No respiratory distress.      Breath sounds: Normal breath sounds. No wheezing.   Chest:      Chest wall: No tenderness.   Abdominal:      General: Abdomen is flat. There is no distension.      Palpations: Abdomen is soft.      Tenderness: There is no abdominal tenderness. There is  no guarding or rebound.   Musculoskeletal:         General: No swelling, tenderness, deformity or signs of injury. Normal range of motion.      Cervical back: Normal range of motion and neck supple. No rigidity. No muscular tenderness.      Right lower leg: No edema.      Left lower leg: No edema.   Skin:     General: Skin is warm and dry.      Capillary Refill: Capillary refill takes less than 2 seconds.   Neurological:      General: No focal deficit present.      Mental Status: She is alert and oriented to person, place, and time.   Psychiatric:         Mood and Affect: Mood normal.          Behavior: Behavior normal.        MDM  Number of Diagnoses or Management Options  Cough  Lung mass  Diagnosis management comments: 80 year old female presents as above with a cough.  Her work-up in the emergency department concerning for new needle biopsy.  Potentially new primary versus metastatic disease with history of breast cancer.  Refer back to oncology for further management.       Amount and/or Complexity of Data Reviewed  Clinical lab tests: reviewed  Tests in the radiology section of CPT??: reviewed           Procedures

## 2021-06-15 ENCOUNTER — Encounter

## 2021-06-15 MED ORDER — CODEINE-GUAIFENESIN 10 MG-100 MG/5 ML ORAL LIQUID
100-10 mg/5 mL | Freq: Three times a day (TID) | ORAL | 2 refills | Status: AC | PRN
Start: 2021-06-15 — End: 2021-07-15

## 2021-06-15 MED ORDER — CODEINE-GUAIFENESIN 10 MG-100 MG/5 ML ORAL LIQUID
100-10 mg/5 mL | Freq: Three times a day (TID) | ORAL | 2 refills | Status: DC | PRN
Start: 2021-06-15 — End: 2021-06-15

## 2021-06-23 NOTE — Telephone Encounter (Signed)
Called patient's husband back to let him know that there will be sedation during the biopsy but it will not be full sedation. I explained that she will still be awake but it will be like a twilight, kind of half asleep sedation technique. He understood and was pleased to hear that. He had no other concerns or questions at this time.

## 2021-06-23 NOTE — Telephone Encounter (Signed)
Patients spouse called to request that the patient be sedated during her CT on Friday.     CB# [phone]

## 2021-06-25 ENCOUNTER — Inpatient Hospital Stay: Admit: 2021-06-25 | Payer: MEDICARE | Attending: Vascular & Interventional Radiology | Primary: Family Medicine

## 2021-06-25 DIAGNOSIS — R918 Other nonspecific abnormal finding of lung field: Secondary | ICD-10-CM

## 2021-06-25 MED ORDER — LIDOCAINE (PF) 10 MG/ML (1 %) IJ SOLN
10 mg/mL (1 %) | INTRAMUSCULAR | Status: AC
Start: 2021-06-25 — End: 2021-06-25
  Administered 2021-06-25: 17:00:00 via INTRADERMAL

## 2021-06-25 MED ORDER — MIDAZOLAM 1 MG/ML IJ SOLN
1 mg/mL | INTRAMUSCULAR | Status: DC | PRN
Start: 2021-06-25 — End: 2021-06-25
  Administered 2021-06-25 (×2): via INTRAVENOUS

## 2021-06-25 MED ORDER — FENTANYL CITRATE (PF) 50 MCG/ML IJ SOLN
50 mcg/mL | INTRAMUSCULAR | Status: DC | PRN
Start: 2021-06-25 — End: 2021-06-25
  Administered 2021-06-25 (×2): via INTRAVENOUS

## 2021-06-25 MED ORDER — SODIUM CHLORIDE 0.9 % IV
Freq: Once | INTRAVENOUS | Status: AC
Start: 2021-06-25 — End: 2021-06-25
  Administered 2021-06-25: 16:00:00 via INTRAVENOUS

## 2021-06-25 MED ORDER — LIDOCAINE HCL 1 % (10 MG/ML) IJ SOLN
10 mg/mL (1 %) | Freq: Once | INTRAMUSCULAR | Status: AC
Start: 2021-06-25 — End: 2021-06-25

## 2021-06-25 MED FILL — XYLOCAINE-MPF 10 MG/ML (1 %) INJECTION SOLUTION: 10 mg/mL (1 %) | INTRAMUSCULAR | Qty: 10

## 2021-06-25 MED FILL — MIDAZOLAM 1 MG/ML IJ SOLN: 1 mg/mL | INTRAMUSCULAR | Qty: 5

## 2021-06-25 MED FILL — FENTANYL CITRATE (PF) 50 MCG/ML IJ SOLN: 50 mcg/mL | INTRAMUSCULAR | Qty: 2

## 2021-06-25 MED FILL — SODIUM CHLORIDE 0.9 % IV: INTRAVENOUS | Qty: 250

## 2021-06-25 NOTE — H&P (Signed)
INTERVENTIONAL RADIOLOGY  Preoperative History and Physical      Patient:  Dawn Green  DOB:  1941/02/21  Age:  80 y.o.  MRN:  [id number]  Today's Date:  06/25/2021      CC / HPI   Dawn Green is a 80 y.o. female with a history of left lingular lung mass who presents for CT guided biopsy.    PAST MEDICAL HISTORY  Past Medical History:   Diagnosis Date    Breast CA [facility]) 2014    Right - Lumpectomy     Diabetes ([place])     Glaucoma     Hx of mammogram 02/05/2016    Negative per pt. Sees Dr. 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     Skin cancer     Basal cell on left side of nose and upper lip     PAST SURGICAL HISTORY  Past Surgical History:   Procedure Laterality Date    HX BREAST BIOPSY Left     x 2 negative biopsies     HX BREAST LUMPECTOMY Right 2014    HX CATARACT REMOVAL Bilateral     w/ IOL implants    HX TAH AND BSO  1997    for rapidly growing fibroid (JW) - benign.  Dr. Oran Rein.    [facility] Patter TUBAL LIGATION  1972    PR BREAST SURGERY PROCEDURE UNLISTED Right 2014    Right breast lumpectomy     SOCIAL HISTORY  Social History     Socioeconomic History    Marital status: MARRIED     Spouse name: Not on file    Number of children: Not on file    Years of education: Not on file    Highest education level: Not on file   Occupational History    Not on file   Tobacco Use    Smoking status: Former     Packs/day: 1.00     Years: 30.00     Pack years: 30.00     Types: Cigarettes     Quit date: 04/17/1996     Years since quitting: 25.2    Smokeless tobacco: Never    Tobacco comments:     Never used vapor or e-cigs    Substance and Sexual Activity    Alcohol use: Yes     Alcohol/week: 11.7 standard drinks     Types: 14 Standard drinks or equivalent per week     Comment: 1 gin and tonics per day    Drug  use: No    Sexual activity: Yes     Partners: Male     Birth control/protection: Surgical   Other Topics Concern    Not on file   Social History Narrative    Not on file     Social Determinants of Health     Financial Resource Strain: Unknown    Difficulty of Paying Living Expenses: Patient refused   Food Insecurity: Unknown    Worried About Charity fundraiser in the Last Year: Patient refused    Ran Out of Food in the Last Year: Patient refused   Transportation Needs: Not on file  Physical Activity: Not on file   Stress: Not on file   Social Connections: Not on file   Intimate Partner Violence: Not on file   Housing Stability: Not on file     FAMILY HISTORY  Family History   Problem Relation Age of Onset    Cancer Mother 28        Ovarian - Passed away in [place] Mother 34    Other Son 49        Pancreatitis     Cancer Paternal Aunt 17        Pancreatic     CURRENT MEDICATIONS  Current Outpatient Medications   Medication Sig Dispense Refill    olmesartan (BENICAR) 20 mg tablet Take 1 Tablet by mouth in the morning. 90 Tablet 3    amitriptyline (ELAVIL) 10 mg tablet Take 1 Tablet by mouth nightly. 90 Tablet 3    benzonatate (TESSALON) 200 mg capsule Take 1 Capsule by mouth nightly as needed for Cough. 30 Capsule 5    alendronate (FOSAMAX) 35 mg tablet TAKE 1 TABLET EVERY 7 DAYS 12 Tablet 3    atorvastatin (LIPITOR) 40 mg tablet TAKE 1 TABLET DAILY BEFORE BREAKFAST 90 Tablet 3    Cetirizine 10 mg cap Take  by mouth as needed.      krill-om-3-dha-epa-phospho-ast 1,000-230-60 mg cap Take 1 Tab by mouth two (2) times a day.      brimonidine-timoloL (COMBIGAN) 0.2-0.5 % drop ophthalmic solution Administer 1 Drop to both eyes every twelve (12) hours.      aspirin 81 mg chewable tablet Take 81 mg by mouth daily.      guaiFENesin-codeine (Cheratussin AC) 100-10 mg/5 mL solution Take 5 mL by mouth three (3) times daily as needed for Cough for up to 30 days. Max Daily Amount: 15 mL. (Patient not taking:  Reported on 06/25/2021) 118 mL 2    ergocalciferol (Vitamin D2) 1,250 mcg (50,000 unit) capsule TAKE 1 CAPSULE EVERY 7 DAYS (Patient not taking: Reported on 06/25/2021) 12 Capsule 3     Current Facility-Administered Medications   Medication Dose Route Frequency Provider Last Rate Last Admin    0.9% sodium chloride infusion  25 mL/hr IntraVENous RAD ONCE Berniece Salines., NP        fentaNYL citrate (PF) injection 25-100 mcg  25-100 mcg IntraVENous Rad Multiple Berniece Salines., NP        midazolam (VERSED) injection 0.5-5 mg  0.5-5 mg IntraVENous Rad Multiple Berniece Salines., NP         ALLERGIES  Allergies   Allergen Reactions    Metformin Nausea Only     Stomach upset    Shampoo & Body Wash [Skin Cleanser,General] Itching     Paul mitchell. Reports she is not allergic.       DIAGNOSTIC STUDIES   IMAGING STUDIES  I personally reviewed the following imaging studies and reports:    CT Results (most recent):  Results from Hospital Encounter encounter on 06/11/21    CT CHEST W CONT    Narrative  INDICATION: lung mass, eval for infection vs neoplasm    COMPARISON: Chest radiographs 06/11/2021    TECHNIQUE:  Following the uneventful intravenous administration of 100 cc  Isovue-300, 5 mm axial images were obtained through the chest. Coronal and  sagittal reformats were generated.  CT dose reduction was achieved through use  of a standardized protocol tailored for this examination and automatic exposure  control for dose modulation.    FINDINGS:    CHEST WALL: No mass or axillary lymphadenopathy. Suspected lumpectomy changes in  the right breast.  THYROID: No nodule.  MEDIASTINUM: 5.3 cm x 2.8 cm aortopulmonary window nodal conglomerate. 6.8 cm x  2.6 cm precarinal nodal conglomerate. 4.9 cm x 2.9 cm subcarinal nodal  conglomerate. Several upper paratracheal lymph nodes measuring up to 15 mm short  axis.  HILA: Several left hilar lymph nodes measuring up to 15 mm short axis  THORACIC AORTA: No dissection or  aneurysm. Atherosclerosis.  MAIN PULMONARY ARTERY: Normal in caliber.  TRACHEA/BRONCHI: Patent.  ESOPHAGUS: No wall thickening or dilatation.  HEART: Cardiomegaly. Coronary artery calcifications.  PLEURA: Small left pleural effusion. No pneumothorax  LUNGS: Emphysema. Scattered subpleural fibrotic changes. 6.4 cm x 4.6 cm  lingular heterogeneously enhancing mass.  INCIDENTALLY IMAGED UPPER ABDOMEN: No significant abnormality in the  incidentally imaged upper abdomen.  BONES: No destructive bone lesion.    Impression  1.  Large lingular mass, compatible with neoplasm.  2.  Bulky mediastinal and left hilar adenopathy, compatible with metastatic  disease.  3.  Small left pleural effusion, likely malignant.  4.  Suspected lumpectomy changes right breast. Correlate with any available  prior breast imaging.    LABS  Lab Results   Component Value Date/Time    WBC 7.0 06/11/2021 10:14 AM    HGB 10.5 (L) 06/11/2021 10:14 AM    HCT 33.4 (L) 06/11/2021 10:14 AM    PLATELET 326 06/11/2021 10:14 AM    MCV 85.9 06/11/2021 10:14 AM     Lab Results   Component Value Date/Time    Sodium 139 06/11/2021 10:14 AM    Potassium 4.1 06/11/2021 10:14 AM    Chloride 103 06/11/2021 10:14 AM    CO2 27 06/11/2021 10:14 AM    Anion gap 9 06/11/2021 10:14 AM    Glucose 124 (H) 06/11/2021 10:14 AM    BUN 8 06/11/2021 10:14 AM    Creatinine 0.62 06/11/2021 10:14 AM    BUN/Creatinine ratio 13 06/11/2021 10:14 AM    GFR est AA >60 06/11/2021 10:14 AM    GFR est non-AA >60 06/11/2021 10:14 AM    Calcium 9.0 06/11/2021 10:14 AM     No results found for: INR, PTMR, PTP, PT1, PT2, INREXT      PHYSICAL EXAM   BP (!) 134/53 (BP 1 Location: Left upper arm, BP Patient Position: Reclining;At rest)    Pulse 70    Temp 97.6 ??F (36.4 ??C)    Resp 20    SpO2 97%   General:  NAD  Heart:  RRR  Lungs:  NWOB  Neurological:  AAOX3      PLAN   Procedure to be performed:  CT guided left lingular lung mass biopsy  Plan for sedation:  moderate  Post procedure plan:   observation per protocol  Informed consent:  risks, benefits, and alternatives reviewed with the patient / family who agree to proceed      Berniece Salines., NP  [facility]

## 2021-07-01 ENCOUNTER — Encounter

## 2021-07-01 NOTE — Telephone Encounter (Signed)
Patients husbands called and would like to get in to talk with Dr. Elroy Channel about what the CT scans say and what they need to do next.  [phone]

## 2021-07-01 NOTE — Progress Notes (Signed)
Discussed with pt and husband.  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  Ordered PET, MRI brain, CT ap, PD-L1 already pending, and will order [facility] NGS.  Will set up to see Dr. Alphonzo Grieve week of 9/26

## 2021-07-05 DIAGNOSIS — C786 Secondary malignant neoplasm of retroperitoneum and peritoneum: Secondary | ICD-10-CM | POA: Diagnosis not present

## 2021-07-05 DIAGNOSIS — K439 Ventral hernia without obstruction or gangrene: Secondary | ICD-10-CM | POA: Diagnosis not present

## 2021-07-06 ENCOUNTER — Inpatient Hospital Stay: Admit: 2021-07-06 | Payer: MEDICARE | Attending: Specialist | Primary: Family Medicine

## 2021-07-06 DIAGNOSIS — C3412 Malignant neoplasm of upper lobe, left bronchus or lung: Secondary | ICD-10-CM

## 2021-07-06 MED ORDER — GADOTERIDOL 279.3 MG/ML INTRAVENOUS SOLUTION
279.3 mg/mL | Freq: Once | INTRAVENOUS | Status: AC
Start: 2021-07-06 — End: 2021-07-06
  Administered 2021-07-06: 22:00:00 via INTRAVENOUS

## 2021-07-06 MED ORDER — IOPAMIDOL 76 % IV SOLN
76 % | Freq: Once | INTRAVENOUS | Status: AC
Start: 2021-07-06 — End: 2021-07-06
  Administered 2021-07-06: 22:00:00 via INTRAVENOUS

## 2021-07-06 MED FILL — PROHANCE 279.3 MG/ML INTRAVENOUS SOLUTION: 279.3 mg/mL | INTRAVENOUS | Qty: 20

## 2021-07-06 MED FILL — ISOVUE-370  76 % INTRAVENOUS SOLUTION: 370 mg iodine /mL (76 %) | INTRAVENOUS | Qty: 100

## 2021-07-07 ENCOUNTER — Telehealth: Payer: Self-pay | Admitting: Oncology

## 2021-07-07 NOTE — Telephone Encounter (Signed)
Vayla called and said she is interested in seeing Dr. Hilma Favors for palliative care.  She is wondering how to get an appointment scheduled.  Advised her that I will find out and call her back.

## 2021-07-09 ENCOUNTER — Inpatient Hospital Stay: Admit: 2021-07-09 | Payer: MEDICARE | Attending: Specialist | Primary: Family Medicine

## 2021-07-09 DIAGNOSIS — C3412 Malignant neoplasm of upper lobe, left bronchus or lung: Secondary | ICD-10-CM

## 2021-07-09 LAB — POCT GLUCOSE: POC Glucose: 132 mg/dL — ABNORMAL HIGH (ref 65–117)

## 2021-07-09 LAB — GLUCOSE, POC: Glucose (POC): 132 mg/dL — ABNORMAL HIGH (ref 65–117)

## 2021-07-09 MED ORDER — FLUDEOXYGLUCOSE F-18 20 MCI TO 200 MCI/ML INTRAVENOUS SOLUTION
20 mCi to 0 mCi/mL | Freq: Once | INTRAVENOUS | Status: AC
Start: 2021-07-09 — End: 2021-07-09
  Administered 2021-07-09: 16:00:00 via INTRAVENOUS

## 2021-07-09 NOTE — Telephone Encounter (Signed)
[place] at [place]  [phone]  07/09/2021 at 2:13 pm--This user called and spoke with the husband in regards to appointment's to see if patient has seen Dr.Sahni yet. The husband stated that they saw him yesterday 09/22 and have an appointment scheduled for 10/05 for surgery.

## 2021-07-13 ENCOUNTER — Inpatient Hospital Stay: Primary: Family Medicine

## 2021-07-15 ENCOUNTER — Ambulatory Visit
Admit: 2021-07-15 | Discharge: 2021-07-15 | Payer: MEDICARE | Attending: Hematology & Oncology | Primary: Family Medicine

## 2021-07-15 ENCOUNTER — Ambulatory Visit: Attending: Hematology & Oncology | Primary: Family Medicine

## 2021-07-15 DIAGNOSIS — C3490 Malignant neoplasm of unspecified part of unspecified bronchus or lung: Secondary | ICD-10-CM

## 2021-07-15 DIAGNOSIS — Z85820 Personal history of malignant melanoma of skin: Secondary | ICD-10-CM | POA: Diagnosis not present

## 2021-07-15 DIAGNOSIS — L570 Actinic keratosis: Secondary | ICD-10-CM | POA: Diagnosis not present

## 2021-07-15 DIAGNOSIS — D485 Neoplasm of uncertain behavior of skin: Secondary | ICD-10-CM | POA: Diagnosis not present

## 2021-07-15 MED ORDER — LIDOCAINE-PRILOCAINE 2.5 %-2.5 % TOPICAL CREAM
CUTANEOUS | 0 refills | Status: AC | PRN
Start: 2021-07-15 — End: ?

## 2021-07-15 NOTE — Progress Notes (Signed)
Progress  Notes by RaddinReuel Derby, MD at 07/15/21 1400                Author: Heloise Purpura, MD  Service: --  Author Type: Physician       Filed: 07/15/21 1720  Encounter Date: 07/15/2021  Status: Signed          Editor: Lesly Pontarelli, Reuel Derby, MD (Physician)                       [facility]   [address], [address] [place] [address]   W: [phone]  F: [phone]           Reason for Visit:     Dawn Green is a 80 y.o. female who is seen in consultation at the request of Dr. Laurena Bering for evaluation of lung cancer.        Hematology/Oncology Treatment History:     ??  CT Chest 06/11/2021: Large lingular mass, compatible with neoplasm. Bulky mediastinal  and left hilar adenopathy, compatible with metastatic disease.  Small left pleural effusion, likely malignant.   ??  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  CT A/P 07/06/2021: Incompletely visualized 6.4 cm x 4.6 cm lingular soft tissue mass. Trace left pleural fluid.   ??  MRI Brain 07/06/2021: Nonenhancing/minimally enhancing ring lesion of the (hemorrhagic/melanotic) left parietal mass lesion measures 11 x 15 x 16 mm in size. Ring-enhancing lesion with minimal associated  susceptibility. There is no significant midline shift or mass effect. There is no significant associated vasogenic edema. Despite its atypical appearance most likely represents a metastasis, not fully delineated. Mild cerebral atrophy and chronic microvascular  ischemic change. No other evidence of intracranial metastasis is definitively demonstrated.    ??  PET/CT 07/09/2021: Large lingular mass demonstrates increased tracer activity and is compatible with the known neoplasm. Hypermetabolic bilateral  supraclavicular, mediastinal, and left hilar lymphadenopathy. Hypermetabolic bone lesions concerning for metastatic disease.   ??  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 female who presents today for evaluation of lung cancer.  She presented to the ED 06/11/2021  with progressive dry cough for several months, associated with some dyspnea.  A CXR was abnormal, prompting a CT Chest which was concerning for lung cancer.  A biopsy was completed on 9/9, followed by further outpatient imaging, and she presents today  to see me for further evaluate.      She reports significant fatigue as well as 30 pound unintentional weight loss in the past few months. Poor appetite.  No pain for the most part.  She has been unsteady with walking, no falls.      She was previously seen by Dr. Laurena Bering for breast cancer.  She had a right lumpectomy 04/2013, radiation 06/2023, and anastrozole from 07/2013 to 07/2018.  She was discharged from his follow up 07/2018.      She is accompanied by her husband today.  She lives with her husband and son in [place].  She previously smoked 1ppd, quit 25 years before diagnosis.      Review of systems was obtained and pertinent findings reviewed above. Past medical history, social history, family history, medications, and allergies are located in the electronic medical record.  Physical Exam:     Visit Vitals      BP  (!) 94/52 (BP 1 Location: Right arm, BP Patient Position: Sitting, BP Cuff Size: Large adult)        Pulse  86     Temp  97.6 ??F (36.4 ??C) (Temporal)     Resp  18        SpO2  97%        ECOG PS: 1   General: no distress   Eyes: anicteric sclerae   HENT: oropharynx clear   Neck: supple   Lymphatic: no cervical, supraclavicular, or inguinal adenopathy   Respiratory: normal respiratory effort   CV: no peripheral edema   GI: soft, nontender, nondistended, no masses   Skin: no rashes, no ecchymoses, no petechiae   Unstable gait        Results:          Lab Results         Component  Value  Date/Time            WBC  7.0  06/11/2021 10:14 AM       HGB  10.5 (L)  06/11/2021 10:14 AM       HCT  33.4 (L)  06/11/2021 10:14 AM        PLATELET  326  06/11/2021 10:14 AM       MCV  85.9  06/11/2021 10:14 AM            ABS. NEUTROPHILS  3.5  06/11/2021 10:14 AM          Lab Results         Component  Value  Date/Time            Sodium  139  06/11/2021 10:14 AM       Potassium  4.1  06/11/2021 10:14 AM       Chloride  103  06/11/2021 10:14 AM       CO2  27  06/11/2021 10:14 AM       Glucose  124 (H)  06/11/2021 10:14 AM       BUN  8  06/11/2021 10:14 AM       Creatinine  0.62  06/11/2021 10:14 AM       GFR est AA  >60  06/11/2021 10:14 AM       GFR est non-AA  >60  06/11/2021 10:14 AM       Calcium  9.0  06/11/2021 10:14 AM       Glucose (POC)  132 (H)  07/09/2021 11:56 AM            Creatinine (POC)  0.8  03/26/2013 01:52 PM          Lab Results         Component  Value  Date/Time            Bilirubin, total  0.4  04/21/2021 10:39 AM       ALT (SGPT)  18  04/21/2021 10:39 AM       Alk. phosphatase  75  04/21/2021 10:39 AM       Protein, total  7.0  04/21/2021 10:39 AM       Albumin  3.5  04/21/2021 10:39 AM            Globulin  3.5  04/21/2021 10:39 AM           Test results above have been reviewed.  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  Imaging notes extensive disease within her left lung, bilateral supraclavicular and mediastinal nodes, bones, and brain.  Her cancer is  not curable and management is with palliative intent.      With her high PDL1, my recommendation is for single agent immunotherapy with pembrolizumab.  We discussed the risks and benefits of pembrolizumab  therapy today. Potential side effects include, but are not limited to fatigue, rash, auto-immune  issues, infertility, allergic reactions, and rarely, death. The patient has consented to beginning therapy.       She would benefit from port placement for therapy.      She is not a candidate for the QUILT2 trial based on her brain metastasis.  She is a  candidate for the DIRECT study.  I will ask our research team to meet with her.      I will additionally request FoundationOne on her pathology to guide future treatment options.      2) Brain metastasis   Isolated metastasis on MRI.  She was seen by Dr. Oletta Darter (neurosurgery) and is planning gamma knife on 10/5.      3) Cough   Taking cough syrup PRN.      4) H/o breast cancer   S/p lumpectomy, radiation, and endocrine therapy.      5) Weight loss   I will ask her dietician to reach out to her.      6) Emotional Well Being   No psychosocial concerns identified today. Patient has adequate support.            Plan:        ??  Port placement with IR   ??  Gamma knife with Dr. Oletta Darter next week   ??  FoundationOne testing on pathology   ??  Plan to proceed in about 2 weeks with Pembrolizumab (244m) given every 3 weeks   ??  Labs: CBC, CMP prior to each cycle, TSH every 6 weeks   ??  Enroll in DIRECT study   ??  Dietician to reach out by phone   ??  Return to clinic with the start of therapy         Signed By:  RHeloise Purpura MD

## 2021-07-15 NOTE — Addendum Note (Signed)
Addendum  Note by Heloise Purpura, MD at 07/15/21 1400                Author: Heloise Purpura, MD  Service: --  Author Type: Physician       Filed: 07/15/21 1722  Encounter Date: 07/15/2021  Status: Signed          Editor: Fielding Mault, Reuel Derby, MD (Physician)          Addended by: Heloise Purpura on: 07/15/2021 05:22 PM    Modules accepted: Level of Service

## 2021-07-15 NOTE — Progress Notes (Signed)
Dawn Green is a 80 y.o. female new patient establishing care for lung cancer evaluation.

## 2021-07-15 NOTE — Telephone Encounter (Signed)
[place] at [place]  [phone]    07/15/21- Per [facility] CDX ordered online on specimen [phone]. New test requisition form,path, demographics and pt assistance application faxed.     07/19/21- Per FM online portal they are waiting on specimen. Phone call placed to [facility] path lab [phone] spoke to Melfa she reported specimen sent out today- fedex track [id number].    07/23/21- Specimen received on 10/4 and ETA 10/17.     07/30/21- Per FM online portal test on hold. Phone call placed to client services at [phone] spoke to [place]- he needed clarification on ordered. Confirmed information with him- test should be processed as normal. NO further actions needed from our office.    Results scanned in under media.

## 2021-07-15 NOTE — Progress Notes (Signed)
[place] at [facility]  [phone]      Chemotherapy education folder given to patient. Consent form reviewed and signed by patient and provider.

## 2021-07-16 NOTE — Telephone Encounter (Signed)
 [facility] [employer] at [place]  [phone]    07/16/21- Returned Dawn Green phone call he was calling to speak to a SW regarding assistance with co-pays. He was advised Dawn Green would contact him- Dawn P. Is covering. Advised Dawn Green. SW will be informed. No further questions or concerns.        From: Dawn GreenThis is about the Foundation One testing Financial assistance - I spoke with Dawn Green on Saturday AM and I think his phone/computer issues resolved.   He has a case number [id number].    [name] was approved for 40% financial assistance but Green unsure what that will cost him.       Not sure if I need to call and check on this.   This is not something I had to be involved with at our site.  I googled and found the [facility] [employer]  [email or web address]   Tel [phone]  Fax [phone].     Dawn Green, [phone]     07/19/21- Phone call placed FM at [phone] spoke to staff who reported Dawn can talk to billing department and discuss Dawn responsibility for test. Called Dawn she will have her husband call back to discuss.    Spoke to Dawn Green he was able to talk to Dawn Green and was provided information on cost of test. He did have more questions regarding authorization and cost of martinique- he would like for someone to call him to further discuss cost. Informed auth department of needs and informed husband someone should be calling him back to further discuss.

## 2021-07-16 NOTE — Telephone Encounter (Signed)
 Telephone Encounter by Koleen Leita BRAVO, RD at 07/16/21 1545                Author: Koleen Leita BRAVO, RD  Service: --  Author Type: Registered Dietitian       Filed: 07/16/21 1557  Encounter Date: 07/16/2021  Status: Signed          Editor: Koleen Leita BRAVO, RD (Registered Dietitian)                    [facility]   [address] [place], [address] [place] [address]   W: [phone]  F: [phone]      Medical Nutrition Therapy   Nutrition Referral:     Referral received from Dr. Junie regarding poor appetite.  Patient with diagnosis of lung cancer. Called and spoke with husband, explained that RD is available to address nutrition throughout the spectrum  of care. She has absolute no appetite, no desire.  She had a fairly good dinner, she had a veggie pizza and had 1/2 slice.  Then spoke with patient.  The last several months, she had a funny taste in her mouth and reports no desire to eat.      Provided contact information and days available at oncology office.           Wt Readings from Last 9 Encounters:        07/09/21  130 lb (59 kg)     07/06/21  145 lb (65.8 kg)     06/11/21  145 lb (65.8 kg)     05/19/21  145 lb (65.8 kg)     04/21/21  145 lb (65.8 kg)     10/29/20  155 lb (70.3 kg)     05/13/20  161 lb (73 kg)     01/02/20  168 lb 12.8 oz (76.6 kg)        07/20/19  170 lb (77.1 kg)        Estimated Nutrition Needs:    Calorie Range: 1770-2065kcal/day     Protein Range: 70-77g/day     Fluid Needs:       Plan:      Discussed consuming 5-6 small meals instead of 3 large meals.       Increase intake of nutrient-dense foods      Drink nutrition supplements      Take advantage of times when feeling good.      Eat meals and snacks in a pleasant atmosphere.      Keep snacks readily available and in eye sight to promote/stimulate appetite.       Eat an extra bedtime snack.  This will give you extra calories but won't affect your appetite for the next  meals.              Signed By:  Leita BRAVO Koleen, RD      [phone]

## 2021-07-16 NOTE — Telephone Encounter (Signed)
Patients spouse and had some questions about the financial assistance paperwork. Requested a call back       CB# [phone]

## 2021-07-17 NOTE — Telephone Encounter (Signed)
 [employer] [employer]  Oncology Social Work  Encounter     Patient Name:      Medical History:     Advance Directives:    Narrative: Spouse, Nancyann, reports he had trouble with his phone and then used his laptop to complete the financial assistance form.  Everything seems ok and no assistance needed today.  Case [id number] and spouse reports being approved for 40% financial assistance and reports they have medicare and and tricare.     From foundation Medicine form -    [facility] [employer].  [facility] [employer]  [email or web address]  Tel [phone]  Fax [phone]  [place] [phone]      Barriers to Care:     Plan:    Referral/Handouts:   Financial/Medication assistance referral

## 2021-07-19 ENCOUNTER — Other Ambulatory Visit: Payer: Self-pay | Admitting: Nurse Practitioner

## 2021-07-19 DIAGNOSIS — R634 Abnormal weight loss: Secondary | ICD-10-CM

## 2021-07-19 DIAGNOSIS — C786 Secondary malignant neoplasm of retroperitoneum and peritoneum: Secondary | ICD-10-CM

## 2021-07-19 DIAGNOSIS — Z7189 Other specified counseling: Secondary | ICD-10-CM

## 2021-07-19 NOTE — Telephone Encounter (Signed)
Patients spouse was returning call that he received earlier today. Requested a call back.     CB# [phone]

## 2021-07-21 ENCOUNTER — Encounter

## 2021-07-22 MED FILL — KEYTRUDA 25 MG/ML INTRAVENOUS SOLUTION: 25 mg/mL | INTRAVENOUS | Qty: 8

## 2021-07-23 ENCOUNTER — Inpatient Hospital Stay
Admit: 2021-07-23 | Payer: MEDICARE | Attending: Student in an Organized Health Care Education/Training Program | Primary: Family Medicine

## 2021-07-23 DIAGNOSIS — C3490 Malignant neoplasm of unspecified part of unspecified bronchus or lung: Secondary | ICD-10-CM

## 2021-07-23 LAB — POCT GLUCOSE
POC Glucose: 152 mg/dL — ABNORMAL HIGH (ref 65–117)
POC Glucose: 137 mg/dL — ABNORMAL HIGH (ref 65–117)

## 2021-07-23 LAB — GLUCOSE, POC
Glucose (POC): 152 mg/dL — ABNORMAL HIGH (ref 65–117)
Glucose (POC): 137 mg/dL — ABNORMAL HIGH (ref 65–117)

## 2021-07-23 MED ORDER — FENTANYL CITRATE (PF) 50 MCG/ML IJ SOLN
50 mcg/mL | INTRAMUSCULAR | Status: DC | PRN
Start: 2021-07-23 — End: 2021-07-23
  Administered 2021-07-23 (×2): via INTRAVENOUS

## 2021-07-23 MED ORDER — LIDOCAINE-EPINEPHRINE 1 %-1:100,000 IJ SOLN
1 %-:00,000 | INTRAMUSCULAR | Status: AC
Start: 2021-07-23 — End: ?

## 2021-07-23 MED ORDER — MIDAZOLAM 1 MG/ML INJECTION (NDG ONLY)
1 mg/mL | INTRAMUSCULAR | Status: AC
Start: 2021-07-23 — End: ?

## 2021-07-23 MED ORDER — MIDAZOLAM 1 MG/ML IJ SOLN
1 mg/mL | Freq: Once | INTRAMUSCULAR | Status: AC
Start: 2021-07-23 — End: 2021-07-23
  Administered 2021-07-23: 17:00:00 via INTRAVENOUS

## 2021-07-23 MED ORDER — HEPARIN, PORCINE (PF) 100 UNIT/ML IV SYRINGE
100 unit/mL | INTRAVENOUS | Status: AC
Start: 2021-07-23 — End: ?

## 2021-07-23 MED ORDER — HEPARIN, PORCINE (PF) 100 UNIT/ML IV SYRINGE
100 unit/mL | Freq: Once | INTRAVENOUS | Status: AC
Start: 2021-07-23 — End: 2021-07-23
  Administered 2021-07-23: 17:00:00

## 2021-07-23 MED ORDER — FENTANYL CITRATE (PF) 50 MCG/ML IJ SOLN
50 mcg/mL | INTRAMUSCULAR | Status: AC
Start: 2021-07-23 — End: ?

## 2021-07-23 MED ORDER — LIDOCAINE-EPINEPHRINE 1 %-1:100,000 IJ SOLN
1 %-:00,000 | Freq: Once | INTRAMUSCULAR | Status: AC
Start: 2021-07-23 — End: 2021-07-23
  Administered 2021-07-23: 17:00:00 via INTRADERMAL

## 2021-07-23 MED ORDER — WATER FOR INJECTION, STERILE INJECTION
INTRAMUSCULAR | Status: AC
Start: 2021-07-23 — End: ?

## 2021-07-23 MED ORDER — CEFAZOLIN 1 GRAM SOLUTION FOR INJECTION
1 gram | Freq: Once | INTRAMUSCULAR | Status: AC
Start: 2021-07-23 — End: 2021-07-23
  Administered 2021-07-23: 16:00:00 via INTRAVENOUS

## 2021-07-23 MED ORDER — MIDAZOLAM 1 MG/ML IJ SOLN
1 mg/mL | INTRAMUSCULAR | Status: DC | PRN
Start: 2021-07-23 — End: 2021-07-23
  Administered 2021-07-23: 17:00:00 via INTRAVENOUS

## 2021-07-23 MED ORDER — LIDOCAINE (PF) 10 MG/ML (1 %) IJ SOLN
10 mg/mL (1 %) | INTRAMUSCULAR | Status: AC
Start: 2021-07-23 — End: ?

## 2021-07-23 MED ORDER — CEFAZOLIN 1 GRAM SOLUTION FOR INJECTION
1 gram | INTRAMUSCULAR | Status: AC
Start: 2021-07-23 — End: ?

## 2021-07-23 MED ORDER — LIDOCAINE HCL 1 % (10 MG/ML) IJ SOLN
10 mg/mL (1 %) | Freq: Once | INTRAMUSCULAR | Status: AC
Start: 2021-07-23 — End: 2021-07-23
  Administered 2021-07-23: 17:00:00 via INTRADERMAL

## 2021-07-23 MED FILL — CEFAZOLIN 1 GRAM SOLUTION FOR INJECTION: 1 gram | INTRAMUSCULAR | Qty: 2000

## 2021-07-23 MED FILL — XYLOCAINE-MPF 10 MG/ML (1 %) INJECTION SOLUTION: 10 mg/mL (1 %) | INTRAMUSCULAR | Qty: 30

## 2021-07-23 MED FILL — XYLOCAINE WITH EPINEPHRINE 1 %-1:100,000 INJECTION SOLUTION: 1 %-:00,000 | INTRAMUSCULAR | Qty: 10

## 2021-07-23 MED FILL — HEPARIN, PORCINE (PF) 100 UNIT/ML IV SYRINGE: 100 unit/mL | INTRAVENOUS | Qty: 5

## 2021-07-23 MED FILL — MIDAZOLAM 1 MG/ML IJ SOLN: 1 mg/mL | INTRAMUSCULAR | Qty: 5

## 2021-07-23 MED FILL — WATER FOR INJECTION, STERILE INJECTION: INTRAMUSCULAR | Qty: 20

## 2021-07-23 MED FILL — FENTANYL CITRATE (PF) 50 MCG/ML IJ SOLN: 50 mcg/mL | INTRAMUSCULAR | Qty: 2

## 2021-07-23 NOTE — H&P (Signed)
H&P by Omer Jack, MD  at 07/23/21 1228                Author: Omer Jack, MD  Service: RADIOLOGY  Author Type: Physician       Filed: 07/23/21 1229  Date of Service: 07/23/21 1228  Status: Signed          Editor: Omer Jack, MD (Physician)               Radiology History and Physical      Patient: Dawn Green 80 y.o. female          Chief Complaint: No chief complaint on file.         History of Present Illness: 80 year old woman with breast cancer presents for port placement.      History:        Past Medical History:        Diagnosis  Date         ?  Breast CA ([place])  2013/01/08          Right - Lumpectomy          ?  Diabetes ([place])       ?  Glaucoma       ?  Hx of mammogram  02/05/2016          Negative per pt. Sees Dr. Laurena Bering          ?  Hyperlipemia       ?  Hypertension       ?  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  Other ill-defined conditions(799.89)  01/09/2015          Mild URI x 4 months (allergies)          ?  Radiation therapy complication  9629     ?  Routine Papanicolaou smear  1996          Negative per pt.          ?  Skin cancer            Basal cell on left side of nose and upper lip          Family History         Problem  Relation  Age of Onset          ?  Cancer  Mother  2              Ovarian - Passed away in January 08, 1965          ?  Ovarian Cancer  Mother  72     ?  Other  Son  79              Pancreatitis           ?  Cancer  Paternal Aunt  30              Pancreatic          Social History          Socioeconomic History         ?  Marital status:  MARRIED              Spouse name:  Not on file         ?  Number of children:  Not on file     ?  Years of education:  Not on file     ?  Highest education level:  Not on file       Occupational History        ?  Not on file       Tobacco Use         ?  Smoking status:  Not on file     ?  Smokeless tobacco:  Never        ?  Tobacco comments:             Never used vapor or e-cigs        Substance and Sexual  Activity         ?  Alcohol use:  Yes              Alcohol/week:  11.7 standard drinks         Types:  14 Standard drinks or equivalent per week             Comment: 1 gin and tonics per day         ?  Drug use:  No     ?  Sexual activity:  Yes              Partners:  Male         Birth control/protection:  Surgical        Other Topics  Concern        ?  Not on file       Social History Narrative        ?  Not on file          Social Determinants of Health          Financial Resource Strain: Unknown        ?  Difficulty of Paying Living Expenses: Patient refused       Food Insecurity: Unknown        ?  Worried About Charity fundraiser in the Last Year: Patient refused     ?  Ran Out of Food in the Last Year: Patient refused       Transportation Needs: Not on file     Physical Activity: Not on file     Stress: Not on file     Social Connections: Not on file     Intimate Partner Violence: Not on file       Housing Stability: Not on file           Allergies:      Allergies        Allergen  Reactions         ?  Metformin  Nausea Only             Stomach upset         ?  Shampoo & Body Wash [Skin Cleanser,General]  Itching             [employer]. Reports she is not allergic.           Current Medications:     Current Facility-Administered Medications          Medication  Dose  Route  Frequency           ?  ceFAZolin (ANCEF) 2 g in sterile water (preservative free) 20 mL IV syringe   2 g  IntraVENous  ONCE           ?  midazolam (VERSED) injection 1-5  mg   1-5 mg  IntraVENous  ONCE           ?  heparin (porcine) pf 100-500 Units   100-500 Units  InterCATHeter  ONCE     ?  fentaNYL citrate (PF) injection 25-200 mcg   25-200 mcg  IntraVENous  Multiple           ?  lidocaine (XYLOCAINE) 10 mg/mL (1 %) injection 1-10 mL   1-10 mL  IntraDERMal  RAD ONCE            Physical Exam:   Blood pressure (!) 101/47, pulse 86, temperature 97.8 ??F (36.6 ??C), resp. rate 12,  height 5\' 3"  (1.6 m), weight 58.8 kg (129 lb 11.2 oz), SpO2  98 %, not currently breastfeeding.   GENERAL: alert, cooperative, no distress, appears stated age,    LUNG: Nonlabored respiration on room air   HEART: regular rate and rhythm      Alerts:        Hospital Problems   Date Reviewed: 07/15/2021     None         Laboratory:    No results for input(s): HGB, HCT, WBC, PLT, INR, BUN, CREA, K, CRCLT,  HGBEXT, HCTEXT, PLTEXT, INREXT in the last 72 hours.      No lab exists for component: PTT, PT         Plan of Care/Planned Procedure:   Risks, benefits, and alternatives reviewed with patient and she agrees to proceed with the procedure.       Port placement      Deemed appropriate for moderate sedation with versed and fentanyl.      Omer Jack, MD   Interventional Radiology   [facility]   12:28 PM, 07/23/2021

## 2021-07-23 NOTE — Procedures (Signed)
Interventional Radiology  Procedure Note        07/23/2021 12:50 PM    Patient: Dawn Green     Informed consent obtained    Diagnosis: Breast cancer    Procedure(s): port placement    Specimens removed:  none    Complications: None    Primary Physician: Omer Jack, MD    Recommendations: N/A    Discharge Disposition: Stable; discharge to home    Full dictated report to follow    Omer Jack, MD  Interventional Radiology  [facility], [facility].  12:50 PM, 07/23/2021

## 2021-07-23 NOTE — Progress Notes (Signed)
Progress  Notes by Noreene Larsson at 07/23/21 1222                Author: Noreene Larsson  Service: --  Author Type: Technician       Filed: 07/23/21 1223  Date of Service: 07/23/21 1222  Status: Signed          Editor: Pase-McCray, Transport planner)               TRANSFER - IN REPORT:      Verbal report received from [place], [place] on Dawn Green  being received from [facility] for  ordered procedure        Report consisted of patients Situation, Background, Assessment and    Recommendations(SBAR).       Information from the following report(s) SBAR was reviewed with the receiving  nurse.      Opportunity for questions and clarification was provided.        Assessment completed upon patients arrival to unit and care assumed.

## 2021-07-23 NOTE — Progress Notes (Signed)
Progress  Notes by Bryan Lemma at 07/23/21 1252                Author: Bryan Lemma  Service: --  Author Type: Technician       Filed: 07/23/21 1252  Date of Service: 07/23/21 1252  Status: Signed          Editor: Pase-McCray, Toniann Fail Radiographer, therapeutic)               TRANSFER - OUT REPORT:      Verbal report given to [facility] on Dawn Green being transferred to [facility] for routine  post - op         Report consisted of patient's Situation, Background, Assessment and    Recommendations(SBAR).       Information from the following report(s) SBAR was reviewed with the receiving nurse.      Opportunity for questions and clarification was provided.        Patient transported with:    Registered Nurse

## 2021-07-26 DIAGNOSIS — C5700 Malignant neoplasm of unspecified fallopian tube: Secondary | ICD-10-CM | POA: Diagnosis not present

## 2021-07-27 ENCOUNTER — Telehealth: Payer: Self-pay | Admitting: Nurse Practitioner

## 2021-07-27 ENCOUNTER — Inpatient Hospital Stay: Payer: MEDICARE | Primary: Family Medicine

## 2021-07-27 ENCOUNTER — Ambulatory Visit: Payer: MEDICARE | Attending: Hematology & Oncology | Primary: Family Medicine

## 2021-07-27 DIAGNOSIS — C3490 Malignant neoplasm of unspecified part of unspecified bronchus or lung: Secondary | ICD-10-CM

## 2021-07-27 MED FILL — KEYTRUDA 25 MG/ML INTRAVENOUS SOLUTION: 25 mg/mL | INTRAVENOUS | Qty: 8

## 2021-07-27 NOTE — Telephone Encounter (Signed)
[place] at [place]  [phone]    07/27/21- Patient's husband called stating patient has no energy and isn't up to getting treatment today.  Patient had gamma knife last week, did not need steroids.  Stated she has no appetite or desire to eat.  They've talked with Mickel Baas who recommended eating something small every 2 hours.  Patient is trying to drink fluids, but not getting a lot in.  Offered to keep patient's appointment today for IV hydration, but he declined.  Office visit and treatment rescheduled to Thursday at 1:30 pm.  Discussed the above with Lucrezia Europe.

## 2021-07-27 NOTE — Telephone Encounter (Signed)
Scheduled per referral, pt confirmed appt

## 2021-07-28 DIAGNOSIS — C786 Secondary malignant neoplasm of retroperitoneum and peritoneum: Secondary | ICD-10-CM | POA: Diagnosis not present

## 2021-07-28 DIAGNOSIS — C787 Secondary malignant neoplasm of liver and intrahepatic bile duct: Secondary | ICD-10-CM | POA: Diagnosis not present

## 2021-07-28 DIAGNOSIS — C5700 Malignant neoplasm of unspecified fallopian tube: Secondary | ICD-10-CM | POA: Diagnosis not present

## 2021-07-29 ENCOUNTER — Ambulatory Visit
Admit: 2021-07-29 | Discharge: 2021-07-29 | Payer: MEDICARE | Attending: Hematology & Oncology | Primary: Family Medicine

## 2021-07-29 ENCOUNTER — Inpatient Hospital Stay: Admit: 2021-07-29 | Payer: MEDICARE | Primary: Family Medicine

## 2021-07-29 ENCOUNTER — Ambulatory Visit: Attending: Hematology & Oncology | Primary: Family Medicine

## 2021-07-29 DIAGNOSIS — Z5112 Encounter for antineoplastic immunotherapy: Secondary | ICD-10-CM

## 2021-07-29 DIAGNOSIS — C3490 Malignant neoplasm of unspecified part of unspecified bronchus or lung: Secondary | ICD-10-CM

## 2021-07-29 LAB — COMPREHENSIVE METABOLIC PANEL
ALT: 19 U/L (ref 12–78)
AST: 14 U/L — ABNORMAL LOW (ref 15–37)
Albumin/Globulin Ratio: 0.6 — ABNORMAL LOW (ref 1.1–2.2)
Albumin: 2.8 g/dL — ABNORMAL LOW (ref 3.5–5.0)
Alkaline Phosphatase: 70 U/L (ref 45–117)
Anion Gap: 6 mmol/L (ref 5–15)
BUN: 18 MG/DL (ref 6–20)
Bun/Cre Ratio: 23 — ABNORMAL HIGH (ref 12–20)
CO2: 32 mmol/L (ref 21–32)
Calcium: 9.4 MG/DL (ref 8.5–10.1)
Chloride: 99 mmol/L (ref 97–108)
Creatinine: 0.79 MG/DL (ref 0.55–1.02)
ESTIMATED GLOMERULAR FILTRATION RATE: >60 mL/min/{1.73_m2} (ref >60)
Globulin: 4.6 g/dL — ABNORMAL HIGH (ref 2.0–4.0)
Glucose: 123 mg/dL — ABNORMAL HIGH (ref 65–100)
Potassium: 3.2 mmol/L — ABNORMAL LOW (ref 3.5–5.1)
Sodium: 137 mmol/L (ref 136–145)
Total Bilirubin: 0.3 MG/DL (ref 0.2–1.0)
Total Protein: 7.4 g/dL (ref 6.4–8.2)

## 2021-07-29 LAB — CBC WITH AUTO DIFFERENTIAL
Basophils %: 1 % (ref 0–1)
Basophils Absolute: 0.1 10*3/uL (ref 0.0–0.1)
Eosinophils %: 8 % — ABNORMAL HIGH (ref 0–7)
Eosinophils Absolute: 0.7 10*3/uL — ABNORMAL HIGH (ref 0.0–0.4)
Granulocyte Absolute Count: 0.1 10*3/uL — ABNORMAL HIGH (ref 0.00–0.04)
Hematocrit: 33.1 % — ABNORMAL LOW (ref 35.0–47.0)
Hemoglobin: 10.6 g/dL — ABNORMAL LOW (ref 11.5–16.0)
Immature Granulocytes: 1 % — ABNORMAL HIGH (ref 0.0–0.5)
Lymphocytes %: 19 % (ref 12–49)
Lymphocytes Absolute: 1.7 10*3/uL (ref 0.8–3.5)
MCH: 26.6 PG (ref 26.0–34.0)
MCHC: 32 g/dL (ref 30.0–36.5)
MCV: 83 FL (ref 80.0–99.0)
MPV: 9.5 FL (ref 8.9–12.9)
Monocytes %: 16 % — ABNORMAL HIGH (ref 5–13)
Monocytes Absolute: 1.4 10*3/uL — ABNORMAL HIGH (ref 0.0–1.0)
NRBC Absolute: 0 10*3/uL (ref 0.00–0.01)
Neutrophils %: 55 % (ref 32–75)
Neutrophils Absolute: 5 10*3/uL (ref 1.8–8.0)
Nucleated RBCs: 0 PER 100 WBC
Platelets: 344 10*3/uL (ref 150–400)
RBC: 3.99 M/uL (ref 3.80–5.20)
RDW: 13.8 % (ref 11.5–14.5)
WBC: 9 10*3/uL (ref 3.6–11.0)

## 2021-07-29 LAB — HEPATITIS B SURFACE ANTIGEN: Hepatitis B Surface Ag: <0.1 Index

## 2021-07-29 LAB — HEPATITIS B SURFACE ANTIBODY
Hep B S Ab: NONREACTIVE
Hepatitis B Surface Ag: <3.1 m[IU]/mL

## 2021-07-29 LAB — TSH 3RD GENERATION
TSH: 2.42 u[IU]/mL (ref 0.36–3.74)
TSH: 2.42 u[IU]/mL (ref 0.36–3.74)

## 2021-07-29 LAB — CBC WITH AUTOMATED DIFF
ABS. BASOPHILS: 0.1 10*3/uL (ref 0.0–0.1)
ABS. EOSINOPHILS: 0.7 10*3/uL — ABNORMAL HIGH (ref 0.0–0.4)
ABS. IMM. GRANS.: 0.1 10*3/uL — ABNORMAL HIGH (ref 0.00–0.04)
ABS. LYMPHOCYTES: 1.7 10*3/uL (ref 0.8–3.5)
ABS. MONOCYTES: 1.4 10*3/uL — ABNORMAL HIGH (ref 0.0–1.0)
ABS. NEUTROPHILS: 5 10*3/uL (ref 1.8–8.0)
ABSOLUTE NRBC: 0 10*3/uL (ref 0.00–0.01)
BASOPHILS: 1 % (ref 0–1)
EOSINOPHILS: 8 % — ABNORMAL HIGH (ref 0–7)
HCT: 33.1 % — ABNORMAL LOW (ref 35.0–47.0)
HGB: 10.6 g/dL — ABNORMAL LOW (ref 11.5–16.0)
IMMATURE GRANULOCYTES: 1 % — ABNORMAL HIGH (ref 0.0–0.5)
LYMPHOCYTES: 19 % (ref 12–49)
MCH: 26.6 pg (ref 26.0–34.0)
MCHC: 32 g/dL (ref 30.0–36.5)
MCV: 83 fL (ref 80.0–99.0)
MONOCYTES: 16 % — ABNORMAL HIGH (ref 5–13)
MPV: 9.5 fL (ref 8.9–12.9)
NEUTROPHILS: 55 % (ref 32–75)
NRBC: 0 /100{WBCs}
PLATELET: 344 10*3/uL (ref 150–400)
RBC: 3.99 M/uL (ref 3.80–5.20)
RDW: 13.8 % (ref 11.5–14.5)
WBC: 9 10*3/uL (ref 3.6–11.0)

## 2021-07-29 LAB — METABOLIC PANEL, COMPREHENSIVE
A-G Ratio: 0.6 — ABNORMAL LOW (ref 1.1–2.2)
ALT (SGPT): 19 U/L (ref 12–78)
AST (SGOT): 14 U/L — ABNORMAL LOW (ref 15–37)
Albumin: 2.8 g/dL — ABNORMAL LOW (ref 3.5–5.0)
Alk. phosphatase: 70 U/L (ref 45–117)
Anion gap: 6 mmol/L (ref 5–15)
BUN/Creatinine ratio: 23 — ABNORMAL HIGH (ref 12–20)
BUN: 18 mg/dL (ref 6–20)
Bilirubin, total: 0.3 mg/dL (ref 0.2–1.0)
CO2: 32 mmol/L (ref 21–32)
Calcium: 9.4 mg/dL (ref 8.5–10.1)
Chloride: 99 mmol/L (ref 97–108)
Creatinine: 0.79 mg/dL (ref 0.55–1.02)
Globulin: 4.6 g/dL — ABNORMAL HIGH (ref 2.0–4.0)
Glucose: 123 mg/dL — ABNORMAL HIGH (ref 65–100)
Potassium: 3.2 mmol/L — ABNORMAL LOW (ref 3.5–5.1)
Protein, total: 7.4 g/dL (ref 6.4–8.2)
Sodium: 137 mmol/L (ref 136–145)
eGFR: >60 mL/min/{1.73_m2} (ref >60)

## 2021-07-29 LAB — HEP B SURFACE AB
Hep B surface Ab Interp.: NONREACTIVE
Hepatitis B surface Ab: <3.1 m[IU]/mL

## 2021-07-29 LAB — HEP B SURFACE AG
Hep B surface Ag Interp.: NEGATIVE
Hepatitis B surface Ag: <0.1 {index}

## 2021-07-29 MED ORDER — SODIUM CHLORIDE 0.9 % INJECTION
INTRAMUSCULAR | Status: DC | PRN
Start: 2021-07-29 — End: 2021-07-30
  Administered 2021-07-29: 21:00:00 via INTRAVENOUS

## 2021-07-29 MED ORDER — SODIUM CHLORIDE 0.9 % IV
INTRAVENOUS | Status: AC | PRN
Start: 2021-07-29 — End: 2021-07-30
  Administered 2021-07-29: 20:00:00 via INTRAVENOUS

## 2021-07-29 MED ORDER — PEMBROLIZUMAB 100 MG/4 ML (25 MG/ML) INTRAVENOUS SOLUTION
25 mg/mL | Freq: Once | INTRAVENOUS | Status: AC
Start: 2021-07-29 — End: 2021-07-29
  Administered 2021-07-29: 20:00:00 via INTRAVENOUS

## 2021-07-29 MED ORDER — ALTEPLASE 2 MG SOLUTION FOR INJECTION
2 mg | Status: DC | PRN
Start: 2021-07-29 — End: 2021-07-30

## 2021-07-29 MED ORDER — HEPARIN, PORCINE (PF) 100 UNIT/ML IV SYRINGE
100 unit/mL | INTRAVENOUS | Status: AC | PRN
Start: 2021-07-29 — End: 2021-07-30
  Administered 2021-07-29: 21:00:00

## 2021-07-29 MED ORDER — SODIUM CHLORIDE 0.9 % IJ SYRG
INTRAMUSCULAR | Status: DC | PRN
Start: 2021-07-29 — End: 2021-07-30

## 2021-07-29 MED ORDER — POTASSIUM CHLORIDE SR 10 MEQ TAB
10 mEq | ORAL | Status: AC
Start: 2021-07-29 — End: 2021-07-29
  Administered 2021-07-29: 20:00:00 via ORAL

## 2021-07-29 MED ORDER — SODIUM CHLORIDE 0.9 % IV
INTRAVENOUS | Status: DC | PRN
Start: 2021-07-29 — End: 2021-07-30

## 2021-07-29 MED FILL — SODIUM CHLORIDE 0.9 % IV: INTRAVENOUS | Qty: 1000

## 2021-07-29 MED FILL — BD POSIFLUSH NORMAL SALINE 0.9 % INJECTION SYRINGE: INTRAMUSCULAR | Qty: 40

## 2021-07-29 MED FILL — POTASSIUM CHLORIDE SR 10 MEQ TAB: 10 mEq | ORAL | Qty: 4

## 2021-07-29 NOTE — Progress Notes (Signed)
 [facility] - Chemotherapy Progress Note    1330 Pt admit to [facility] for C 1 D 1 Keytruda ambulatory in stable condition. Assessment completed. No new concerns voiced. PAC with positive blood return.  Pt assessed and accessed by Ginny.  Chemotherapy Flowsheet 07/29/2021   Cycle C1D1   Date 07/29/2021   Drug / Regimen Keytruda       Visit Vitals  BP 130/71   Pulse 78   Temp 97.4 F (36.3 C)   Resp 16   Ht 5' 3 (1.6 m)   Wt 58.5 kg (129 lb)   SpO2 95%   BMI 22.85 kg/m           Two nurses verified prior to administering:    Drug name    Drug dose    Infusion volume or drug volume when prepared in a syringe    Rate of administration    Route of administration    Expiration dates and/or times    Appearance and physical integrity of the drugs    Rate set on infusion pump, when used    Sequencing of drug administration    Medications:  Medications Administered       0.9% sodium chloride infusion       Admin Date  07/29/2021 Action  New Bag Dose  250 mL/hr Rate  250 mL/hr Route  IntraVENous Administered By  Reuel Railing, RN              0.9% sodium chloride injection 5-40 mL       Admin Date  07/29/2021 Action  Given Dose  10 mL Route  IntraVENous Administered By  Reuel Railing, RN              heparin (porcine) pf 500 Units       Admin Date  07/29/2021 Action  Given Dose  500 Units Route  InterCATHeter Administered By  Reuel Railing, RN              pembrolizumab (KEYTRUDA) 200 mg in 0.9% sodium chloride 100 mL, overfill volume 10 mL IVPB       Admin Date  07/29/2021 Action  New Bag Dose  200 mg Rate  236 mL/hr Route  IntraVENous Administered By  Reuel Railing, RN              potassium chloride SR (KLOR-CON 10) tablet 40 mEq       Admin Date  07/29/2021 Action  Given Dose  40 mEq Route  Oral Administered By  Reuel Railing, RN                     1655 Pt tolerated treatment well. PAC maintained positive blood return throughout treatment, flushed with positive blood return at conclusion and throughout . D/c  home ambulatory in no distress. Pt aware of next appointment scheduled for 08/17/21.       AVS declined  Recent Results (from the past 12 hour(s))   CBC WITH AUTOMATED DIFF    Collection Time: 07/29/21  1:47 PM   Result Value Ref Range    WBC 9.0 3.6 - 11.0 K/uL    RBC 3.99 3.80 - 5.20 M/uL    HGB 10.6 (L) 11.5 - 16.0 g/dL    HCT 66.8 (L) 64.9 - 47.0 %    MCV 83.0 80.0 - 99.0 FL    MCH 26.6 26.0 - 34.0 PG    MCHC 32.0 30.0 - 36.5 g/dL  RDW 13.8 11.5 - 14.5 %    PLATELET 344 150 - 400 K/uL    MPV 9.5 8.9 - 12.9 FL    NRBC 0.0 0 PER 100 WBC    ABSOLUTE NRBC 0.00 0.00 - 0.01 K/uL    NEUTROPHILS 55 32 - 75 %    LYMPHOCYTES 19 12 - 49 %    MONOCYTES 16 (H) 5 - 13 %    EOSINOPHILS 8 (H) 0 - 7 %    BASOPHILS 1 0 - 1 %    IMMATURE GRANULOCYTES 1 (H) 0.0 - 0.5 %    ABS. NEUTROPHILS 5.0 1.8 - 8.0 K/UL    ABS. LYMPHOCYTES 1.7 0.8 - 3.5 K/UL    ABS. MONOCYTES 1.4 (H) 0.0 - 1.0 K/UL    ABS. EOSINOPHILS 0.7 (H) 0.0 - 0.4 K/UL    ABS. BASOPHILS 0.1 0.0 - 0.1 K/UL    ABS. IMM. GRANS. 0.1 (H) 0.00 - 0.04 K/UL    DF AUTOMATED     METABOLIC PANEL, COMPREHENSIVE    Collection Time: 07/29/21  1:47 PM   Result Value Ref Range    Sodium 137 136 - 145 mmol/L    Potassium 3.2 (L) 3.5 - 5.1 mmol/L    Chloride 99 97 - 108 mmol/L    CO2 32 21 - 32 mmol/L    Anion gap 6 5 - 15 mmol/L    Glucose 123 (H) 65 - 100 mg/dL    BUN 18 6 - 20 MG/DL    Creatinine 9.20 9.44 - 1.02 MG/DL    BUN/Creatinine ratio 23 (H) 12 - 20      eGFR >60 >60 ml/min/1.37m2    Calcium 9.4 8.5 - 10.1 MG/DL    Bilirubin, total 0.3 0.2 - 1.0 MG/DL    ALT (SGPT) 19 12 - 78 U/L    AST (SGOT) 14 (L) 15 - 37 U/L    Alk. phosphatase 70 45 - 117 U/L    Protein, total 7.4 6.4 - 8.2 g/dL    Albumin 2.8 (L) 3.5 - 5.0 g/dL    Globulin 4.6 (H) 2.0 - 4.0 g/dL    A-G Ratio 0.6 (L) 1.1 - 2.2     TSH 3RD GENERATION    Collection Time: 07/29/21  1:47 PM   Result Value Ref Range    TSH 2.42 0.36 - 3.74 uIU/mL

## 2021-07-29 NOTE — Progress Notes (Signed)
Progress  Notes by RaddinReuel Derby, MD at 07/29/21 1345                Author: Heloise Purpura, MD  Service: --  Author Type: Physician       Filed: 07/29/21 1656  Encounter Date: 07/29/2021  Status: Signed          Editor: Berry Gallacher, Reuel Derby, MD (Physician)                       [facility]   [address], [address] [place] [address]   W: [phone]  F: [phone]           Reason for Visit:     Dawn Green is a 80 y.o. female who is seen in follow up for evaluation of lung cancer.        Hematology/Oncology Treatment History:     ??  CT Chest 06/11/2021: Large lingular mass, compatible with neoplasm. Bulky mediastinal  and left hilar adenopathy, compatible with metastatic disease.  Small left pleural effusion, likely malignant.   ??  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  CT A/P 07/06/2021: Incompletely visualized 6.4 cm x 4.6 cm lingular soft tissue mass. Trace left pleural fluid.   ??  MRI Brain 07/06/2021: Nonenhancing/minimally enhancing ring lesion of the (hemorrhagic/melanotic) left parietal mass lesion measures 11 x 15 x 16 mm in size. Ring-enhancing lesion with minimal associated  susceptibility. There is no significant midline shift or mass effect. There is no significant associated vasogenic edema. Despite its atypical appearance most likely represents a metastasis, not fully delineated. Mild cerebral atrophy and chronic microvascular  ischemic change. No other evidence of intracranial metastasis is definitively demonstrated.    ??  PET/CT 07/09/2021: Large lingular mass demonstrates increased tracer activity and is compatible with the known neoplasm. Hypermetabolic bilateral  supraclavicular, mediastinal, and left hilar lymphadenopathy. Hypermetabolic bone lesions concerning for metastatic disease.   ??  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  Gamma knife with Dr. Oletta Darter on 07/21/2021   ??  Initiated palliative  systemic therapy with Pembrolizumab on 07/27/2021        History of Present Illness:     Presents for initiation of therapy. Port placement went well.       She was due to have treatment on Tuesday this week, but had concerns with increased weakness and fell in the shower. Treatment was delayed to today. States she is feeling anxious/overwhelmed today and "my tank is empty".       Today, she reports feeling weak and fatigued. She is drinking  around 1 boost a day, has little to no appetite. Reports some constipation, relieved with laxatives.       Reports baseline cough. Stating it is a "violent" cough. Onset of cough was around 2 months ago. Denies SOB, chest pain.       Denies fever, chills, nausea/vomiting.       She underwent gamma knife on 10/5 with Dr. Oletta Darter.       She was previously seen by Dr. Laurena Bering for breast cancer.  She had a right lumpectomy 04/2013, radiation 06/2023, and anastrozole from 07/2013 to 07/2018.  She was discharged from his follow up 07/2018.      She is accompanied by her husband today.  She lives with her husband and son in [place].  She previously smoked 1ppd, quit 25 years before diagnosis.      Review of systems was obtained and pertinent findings reviewed above. Past medical history, social history, family history, medications, and allergies are located in the electronic medical record.        Physical Exam:     Visit Vitals      BP  (!) 110/50     Pulse  84     Temp  97.4 ??F (36.3 ??C)     Resp  16     Ht  5' 3"  (1.6 m)     Wt  129 lb (58.5 kg)     SpO2  95%        BMI  22.85 kg/m??           ECOG PS: 1   General: no distress   Eyes: anicteric sclerae   HENT: oropharynx clear   Neck: supple   Lymphatic: no cervical, supraclavicular, or inguinal adenopathy   Respiratory: normal respiratory effort   CV: no peripheral edema   GI: soft, nontender, nondistended, no masses   Skin: no rashes, no ecchymoses, no petechiae   Unstable gait        Results:          Lab Results         Component   Value  Date/Time            WBC  9.0  07/29/2021 01:47 PM       HGB  10.6 (L)  07/29/2021 01:47 PM       HCT  33.1 (L)  07/29/2021 01:47 PM       PLATELET  344  07/29/2021 01:47 PM       MCV  83.0  07/29/2021 01:47 PM            ABS. NEUTROPHILS  5.0  07/29/2021 01:47 PM          Lab Results         Component  Value  Date/Time            Sodium  137  07/29/2021 01:47 PM       Potassium  3.2 (L)  07/29/2021 01:47 PM       Chloride  99  07/29/2021 01:47 PM       CO2  32  07/29/2021 01:47 PM       Glucose  123 (H)  07/29/2021 01:47 PM       BUN  18  07/29/2021 01:47 PM       Creatinine  0.79  07/29/2021 01:47 PM       GFR est AA  >60  06/11/2021 10:14 AM       GFR est non-AA  >60  06/11/2021 10:14 AM       Calcium  9.4  07/29/2021 01:47 PM       Glucose (POC)  137 (H)  07/23/2021 12:58 PM            Creatinine (POC)  0.8  03/26/2013 01:52 PM          Lab Results         Component  Value  Date/Time            Bilirubin, total  0.3  07/29/2021 01:47 PM       ALT (SGPT)  19  07/29/2021 01:47 PM       Alk. phosphatase  70  07/29/2021 01:47 PM  Protein, total  7.4  07/29/2021 01:47 PM       Albumin  2.8 (L)  07/29/2021 01:47 PM            Globulin  4.6 (H)  07/29/2021 01:47 PM           Test results above have been reviewed.        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  Imaging notes extensive disease within her left lung, bilateral supraclavicular and mediastinal nodes, bones, and brain.  Her cancer is  not curable and management is with palliative intent.      With her high PDL1, my recommendation is for single agent immunotherapy with pembrolizumab.  We discussed the risks and benefits of pembrolizumab  therapy today. Potential side effects include, but are not limited to fatigue, rash, auto-immune  issues, infertility, allergic reactions, and rarely, death. The patient has consented to beginning  therapy.       Proceed with C1 today as ordered.       She has elected against enrollment in the DIRECT study.        FoundationOne on her pathology to guide future treatment options is pending.       2) Brain metastasis   Isolated metastasis on MRI.  She was seen by Dr. Oletta Darter (neurosurgery) and underwent gamma knife on 10/5. MRI done at time of procedure with 2 new lesions, uncertain if cancer lesions vs infarcts.  She has been referred to cardiology.       3) Cough   Taking cough syrup PRN.      4) H/o breast cancer   S/p lumpectomy, radiation, and endocrine therapy.      5) Weight loss   Dietician supportive phone call on 07/16/2021. Recommended increasing to 2 boosts/daily. Encouraged increasing fluid/caloric intake.  Monitor on therapy.       6) Emotional Well Being   No psychosocial concerns identified today. Patient has adequate support.            Plan:        ??  Proceed today with C1 Pembrolizumab (212m) given every 3 weeks   ??  Labs: CBC, CMP prior to each cycle, TSH every 6 weeks   ??  Follow up every 3 weeks      I personally saw and evaluated the patient and performed the key components of medical decision making.  The history, physical exam, and documentation were performed by MLucrezia Europe NP.  I reviewed  and verified the above documentation and modified it as needed. She is recovering from her gamma knife.  We will proceed today with her first cycle of immunotherapy.         Signed By:  RHeloise Purpura MD

## 2021-07-29 NOTE — Progress Notes (Signed)
 Dawn Green is a 80 y.o. female follow up for lung cancer.    1. Have you been to the ER, urgent care clinic since your last visit?  Hospitalized since your last visit?no     2. Have you seen or consulted any other health care providers outside of the [facility] since your last visit?  Include any pap smears or colon screening. No    Vitals 07/29/2021   Blood Pressure 110/50   Pulse 84   Temp 97.4   Resp 16   Height 5' 3   Weight 129 lb   SpO2 95   BSA 1.61 m2   BMI 22.85 kg/m2

## 2021-07-30 LAB — HEPATITIS B CORE ANTIBODY, TOTAL: Hep B Core Total Ab: NEGATIVE

## 2021-07-30 LAB — HEPATITIS B CORE AB, TOTAL: Hep B Core Ab, total: NEGATIVE

## 2021-07-30 NOTE — Telephone Encounter (Signed)
[facility] [employer] at [place]  [phone]    07/30/21- Patient's husband stated patient's first Keytruda infusion went really well yesterday.  Unfortunately, she had a rough night due to coughing.  She has cough medicine with codeine that helps, but she's hesitant to take it.  OTC cough medication doesn't help.  Encouraged patient to take the cough medicine with codeine if that helps and hopefully she won't need it long term.  He verbalized understanding and was appreciative of the call.

## 2021-07-30 NOTE — Telephone Encounter (Signed)
Patient spouse called and stated that the patient had her first infusion yesterday and that last night she was coughing non stop to the point she didnt sleep very well. Patient spouse stated that he does have some cough syrup. Requested a call back to discuss.     CB# [phone]

## 2021-08-02 NOTE — Telephone Encounter (Signed)
[place] at [facility]  [phone]    Unfortunately, FoundationOne could not be obtained, QNS.    At progression, we can consider a repeat biopsy for NGS and/or liquid NGS testing.

## 2021-08-03 ENCOUNTER — Encounter: Payer: Self-pay | Admitting: Hematology and Oncology

## 2021-08-10 ENCOUNTER — Inpatient Hospital Stay: Payer: Medicare Other | Attending: Nurse Practitioner | Admitting: Nurse Practitioner

## 2021-08-10 ENCOUNTER — Other Ambulatory Visit: Payer: Self-pay

## 2021-08-10 VITALS — BP 136/70 | HR 110 | Temp 97.6°F | Resp 18 | Wt 132.6 lb

## 2021-08-10 DIAGNOSIS — G893 Neoplasm related pain (acute) (chronic): Secondary | ICD-10-CM | POA: Diagnosis not present

## 2021-08-10 DIAGNOSIS — Z7189 Other specified counseling: Secondary | ICD-10-CM

## 2021-08-10 DIAGNOSIS — C799 Secondary malignant neoplasm of unspecified site: Secondary | ICD-10-CM | POA: Diagnosis not present

## 2021-08-10 DIAGNOSIS — C5702 Malignant neoplasm of left fallopian tube: Secondary | ICD-10-CM | POA: Diagnosis not present

## 2021-08-10 DIAGNOSIS — C786 Secondary malignant neoplasm of retroperitoneum and peritoneum: Secondary | ICD-10-CM | POA: Diagnosis not present

## 2021-08-10 DIAGNOSIS — Z66 Do not resuscitate: Secondary | ICD-10-CM | POA: Diagnosis not present

## 2021-08-10 DIAGNOSIS — R1012 Left upper quadrant pain: Secondary | ICD-10-CM | POA: Diagnosis not present

## 2021-08-10 DIAGNOSIS — Z515 Encounter for palliative care: Secondary | ICD-10-CM

## 2021-08-10 MED ORDER — PROCHLORPERAZINE MALEATE 10 MG PO TABS: 10.0000 mg | ORAL_TABLET | Freq: Four times a day (QID) | ORAL | 1 refills | Status: AC | PRN

## 2021-08-10 MED ORDER — OXYCODONE HCL 5 MG PO TABS: 5.0000 mg | ORAL_TABLET | Freq: Four times a day (QID) | ORAL | 0 refills | Status: AC | PRN

## 2021-08-10 NOTE — Progress Notes (Signed)
[place]  [phone]) [phone] [phone]) [phone]   Name: [name] Date: 08/10/2021 MRN: [id number]  DOB: September 25, 1941  Patient Care Team: [name], MD as PCP - General (Internal Medicine)    REASON FOR CONSULTATION: Jessica Johnston is a 80 y.o. 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 Roanna Raider Onc), anemia, history of melanoma, acquired pancytopenia, and CKD III .  Palliative ask to see for symptom management and goals of care.    SOCIAL HISTORY:     reports that she has never smoked. She has never used smokeless tobacco. She reports current alcohol use of about 9.0 standard drinks per week. She reports that she does not use drugs.  ADVANCE DIRECTIVES:  Patient states she has a completed advanced directive. This has been recently updated and family is aware of documents. Her husband is her Scientist, research (medical).   CODE STATUS: DNR  PAST MEDICAL HISTORY: Past Medical History:  Diagnosis Date   Arthritis    arthritis -hands-knees.   Cancer (HCC)    MELANOMA. multiple x5 different sites. last 10 yrs ago.   Headache    past hx.-no in awhile.   High cholesterol    Melanoma ([place])    Osteopenia    Skin cancer    melanoma    PAST SURGICAL HISTORY:  Past Surgical History:  Procedure Laterality Date   ABDOMINAL HYSTERECTOMY N/A 02/18/2016   Procedure: HYSTERECTOMY ABDOMINAL;  Surgeon: Everitt Amber, MD;  Location: [facility];  Service: Gynecology;  Laterality: N/A;   DEBULKING N/A 02/18/2016   Procedure: RADICAL TUMOR DEBULKING;  Surgeon: Everitt Amber, MD;  Location: [facility];  Service: Gynecology;  Laterality: N/A;   HYSTEROSCOPY     LAPAROTOMY N/A 02/18/2016   Procedure: EXPLORATORY LAPAROTOMY;  Surgeon: Everitt Amber, MD;  Location: [facility];  Service:  Gynecology;  Laterality: N/A;   omental mass     Biopsy, CT guided   OMENTECTOMY N/A 02/18/2016   Procedure: OMENTECTOMY;  Surgeon: Everitt Amber, MD;  Location: [facility];  Service: Gynecology;  Laterality: N/A;   SALPINGOOPHORECTOMY Bilateral 02/18/2016   Procedure: BILATERAL SALPINGO OOPHORECTOMY;  Surgeon: Everitt Amber, MD;  Location: [facility];  Service: Gynecology;  Laterality: Bilateral;   TONSILLECTOMY AND ADENOIDECTOMY  1948    HEMATOLOGY/ONCOLOGY HISTORY:  Oncology History Overview Note  High grade serous, left fallopian Neg genetics from germline mutation or tumor ER 20% PR 0% MMR normal MSI stable Patient self discontinued Niraparib Progressed on Taxol, Avastin, Gemzar, carboplatin, doxil and topotecan      Fallopian tube cancer, carcinoma ([place])  08/06/2014 Tumor Marker   Patient's tumor was tested for the following markers: CA-125 Results of the tumor marker test revealed 49   01/27/2016 Imaging   1. Extensive omental nodularity highly worrisome for peritoneal carcinomatosis. Late recurrence of melanoma can present as peritoneal carcinomatosis. Ovarian cancer more commonly presents in this manner. Patient does have a predominately low density right adnexal lesion measuring up to 3.5 cm, although this lesion is not typical for ovarian cancer. 2. No evidence of bowel or ureteral obstruction. 3. No other definite signs of metastatic disease. There are small indeterminate low-density hepatic lesions.   02/08/2016 Tumor Marker   Patient's tumor was tested for the following markers: CA-125 Results of the tumor marker test revealed 656.2   02/15/2016 Procedure  CT-guided core biopsy performed of omental mass just deep to the abdominal wall.   02/15/2016 Pathology Results   Omentum, biopsy, left - PAPILLARY SEROUS NEOPLASM, SEE COMMENT. Microscopic Comment There are papillary collections of largely low grade appearing cells with numerous psammoma bodies. Given the limited material it is  difficult to distinguish between invasive implants of a serous borderline tumor and serous carcinoma, especially given the low grade appearance.   02/18/2016 Pathology Results   1. Omentum, resection for tumor INVASIVE IMPLANT OF HIGH GRADE SEROUS CARCINOMA 2. Uterus +/- tubes/ovaries, neoplastic HIGH GRADE SEROUS CARCINOMA INVOLVING LEFT TUBAL FIMBRIA SEROUS CARCINOMA WITH PREDOMINANT PSAMMOMA BODIES IMPLANT AT UTERUS SEROSA, BILATERAL FALLOPIAN TUBAL SEROSA, AND ANTERIOR PERITONEAL REFLECTION CERVIX: HISTOLOGICAL UNREMARKABLE ENDOMETRIUM: INACTIVE ENDOMETRIUM MYOMETRIUM: LEIOMYOMA LEFT OVARY: CYSTADENOFIBROMA RIGHT OVARY AND FALLOPIAN TUBE: HISTOLOGICAL UNREMARKABLE Microscopic Comment 2. ONCOLOGY TABLE - FALLOPIAN TUBE 1. Specimen, including laterality: Omentum, uterus, bilateral ovaries and fallopian tubes 2. Procedure: Hysterectomy, bilateral salpingo-oophorectomy and tumor debulking omentectomy 3. Lymph node sampling performed: No 4. Tumor site: uterus serosa, bilateral fallopian tubal serosa, peritoneal and omentum 5. Tumor location in fallopian tube: Left fallopian tubal fimbria 6. Specimen integrity (intact/ruptured/disrupted): Intact 7. Tumor size (cm): multi focal invasive omentum implant greater than 2 cm, left fallopian tube tumor 0.8 cm. 8. Histologic type: Serous carcinoma 9. Grade: 3 10. Microscopic tumor extension: uterus serosa, bilateral fallopian tubal serosa, peritoneal and omentum 11. Margins: NA 12. Lymph-Vascular invasion: identified 13. Lymph nodes: # examined: 0; # positive: NA 14. TNM: pT3c, pNx 15. FIGO Stage (based on pathologic findings, needs clinical correlation: IIIC 16. Comment: High grade serous carcinoma multifocally and extensively involves left fallopian tubal fimbria, omentum, uterus serosa, bilateral fallopian tubal serosa and peritoneal. At the left fallopian tube there are small foci of serous tubal intraepithelial carcinoma identified, so we  conclude the carcinoma is fallopian tube origin   03/09/2016 Tumor Marker   Patient's tumor was tested for the following markers: CA-125 Results of the tumor marker test revealed 154.4   03/15/2016 Procedure   Technically successful right IJ power-injectable port catheter placement. Ready for routine use.   03/18/2016 - 07/13/2016 Chemotherapy   The patient had 6 cycles of carboplatin and taxol   04/14/2016 Tumor Marker   Patient's tumor was tested for the following markers: CA-125 Results of the tumor marker test revealed 36.7   04/25/2016 Genetic Testing   Patient has genetic testing done for germline mutation Results revealed patient has no mutation   04/28/2016 Tumor Marker   Patient's tumor was tested for the following markers: CA-125 Results of the tumor marker test revealed 24.6   06/21/2016 Tumor Marker   Patient's tumor was tested for the following markers: CA-125 Results of the tumor marker test revealed 16.6   08/01/2016 Tumor Marker   Patient's tumor was tested for the following markers: CA-125 Results of the tumor marker test revealed 17.2   08/05/2016 Imaging   Interval TAH-BSO. No evidence of residual pelvic mass or metastatic disease within the abdomen or pelvis. No other acute findings.     11/04/2016 Tumor Marker   Patient's tumor was tested for the following markers: CA-125 Results of the tumor marker test revealed 13.6   01/20/2017 Tumor Marker   Patient's tumor was tested for the following markers: CA-125 Results of the tumor marker test revealed 13.8   04/14/2017 Tumor Marker   Patient's tumor was tested for the following markers: CA-125 Results of the tumor marker test revealed 16.1   06/13/2017 Imaging  No acute findings.  No mass or hernia identified.   Colonic diverticulosis, without radiographic evidence of diverticulitis.   Aortic atherosclerosis.   07/03/2017 Tumor Marker   Patient's tumor was tested for the following markers: CA-125 Results of  the tumor marker test revealed 18.4   09/19/2017 Tumor Marker   Patient's tumor was tested for the following markers: CA-125 Results of the tumor marker test revealed 18.5   01/23/2018 Tumor Marker   Patient's tumor was tested for the following markers: CA-125 Results of the tumor marker test revealed 35.4   01/30/2018 Imaging   1. No evidence of local fallopian tube carcinoma recurrence within the pelvis. Post hysterectomy. 2. No evidence of metastatic peritoneal disease or omental disease. No solid organ metastasis.     03/20/2018 Tumor Marker   Patient's tumor was tested for the following markers: CA-125 Results of the tumor marker test revealed 73   05/02/2018 Tumor Marker   Patient's tumor was tested for the following markers: CA-125 Results of the tumor marker test revealed 127.1   05/08/2018 Imaging   CT abdomen and pelvis 1. 7 cm left subdiaphragmatic collection of loculated fluid versus low-density soft tissue. Given the patient's history of rising CA 125, metastatic disease considered highly likely.  2. Areas of fluid or recurrent disease identified in the right lower quadrant adjacent to the cecum and along right lower quadrant small bowel loops.   05/14/2018 Tumor Marker   Patient's tumor was tested for the following markers: CA-125 Results of the tumor marker test revealed 166    Genetic Testing   Patient has genetic testing done for ER/PR and MMR. Results revealed patient has the following on 02/18/2016 surgical pathology: ER 20%, PR 0% MMR: normal    Genetic Testing   Patient has genetic testing done for MSI. Results revealed patient has the following: MSI stable   05/18/2018 - 12/31/2018 Chemotherapy   The patient had carboplatin and Doxil   07/16/2018 Tumor Marker   Patient's tumor was tested for the following markers: CA-125 Results of the tumor marker test revealed 28.7   08/10/2018 Imaging   CT imaging:  Decreased size of peritoneal soft tissue masses in  left upper quadrant, consistent with decreased metastatic disease.  No new or progressive metastatic disease.  No other acute findings.  Colonic diverticulosis, without radiographic evidence of diverticulitis.  Stable small paraumbilical ventral hernia containing transverse colon.   08/13/2018 Tumor Marker   Patient's tumor was tested for the following markers: CA-125 Results of the tumor marker test revealed 21.6   08/24/2018 Echocardiogram   LV EF: 60% -   65%   10/15/2018 Tumor Marker   Patient's tumor was tested for the following markers: CA-125 Results of the tumor marker test revealed 20.1   10/19/2018 Imaging   Ct scan of abdomen and pelvis Status post hysterectomy, bilateral salpingo-oophorectomy, and omentectomy.   Two peritoneal implants in the left upper abdomen are stable versus mildly decreased, as above. No abdominopelvic ascites.   No evidence of new/progressive metastatic disease.       11/19/2018 Tumor Marker   Patient's tumor was tested for the following markers: CA-125 Results of the tumor marker test revealed 21.7   11/23/2018 Echocardiogram   1. The left ventricle has normal systolic function of 61-44%. The cavity size was normal. There is no increased left ventricular wall thickness. Echo evidence of impaired diastolic relaxation.  2. The right ventricle has normal systolic function. The cavity was normal. There is no increase  in right ventricular wall thickness.  3. The mitral valve is normal in structure. There is mild mitral annular calcification present.  4. The tricuspid valve is normal in structure.  5. The aortic valve is tricuspid There is mild thickening of the aortic valve.  6. The pulmonic valve was normal in structure. Pulmonic valve regurgitation is mild by color flow Doppler.  7. Normal LV systolic function; mild diastolic dysfunction.   12/31/2018 Tumor Marker   Patient's tumor was tested for the following markers: CA-125 Results of the tumor  marker test revealed 17.1   01/31/2019 Imaging   1. Left upper quadrant peritoneal implants described previously have decreased in the interval. No new or progressive findings in the abdomen/pelvis today. No free fluid. 2. Right paraumbilical ventral hernia contains a small knuckle of transverse colon without complicating features. 3.  Aortic Atherosclerois (ICD10-170.0)   Aortic Atherosclerosis (ICD10-I70.0).   01/31/2019 Tumor Marker   Patient's tumor was tested for the following markers: CA-125 Results of the tumor marker test revealed 18.9   02/11/2019 - 03/13/2019 Chemotherapy   The patient is taking Niraparib. She self discontinued after 1 month   02/25/2019 Tumor Marker   Patient's tumor was tested for the following markers: CA-125 Results of the tumor marker test revealed 18.6   05/13/2019 Tumor Marker   Patient's tumor was tested for the following markers: CA-125 Results of the tumor marker test revealed 230   05/22/2019 Imaging   1. Increased size of 4.6 cm soft tissue mass in the gastrosplenic ligament, consistent with metastatic disease. 2. No other sites of metastatic disease identified within the abdomen or pelvis. 3. Colonic diverticulosis. No radiographic evidence of diverticulitis. 4. Stable small right paraumbilical hernia containing transverse colon.   Aortic Atherosclerosis (ICD10-I70.0).   05/31/2019 - 08/22/2019 Chemotherapy   The patient had bevacizumab and Taxol for chemotherapy treatment.     05/31/2019 Tumor Marker   Patient's tumor was tested for the following markers: CA-125 Results of the tumor marker test revealed 84   06/21/2019 Tumor Marker   Patient's tumor was tested for the following markers: CA-125 Results of the tumor marker test revealed 49.4.   07/19/2019 Tumor Marker   Patient's tumor was tested for the following markers: CA-125. Results of the tumor marker test revealed 44.5   08/22/2019 Imaging   1. Increase in size of 5.0 cm mass in the  gastro splenic ligament consistent with metastatic disease. 2. No additional sites of disease identified within the abdomen or pelvis. 3. Unchanged paraumbilical hernia containing nonobstructed loop of large bowel. 4.  Aortic Atherosclerosis (ICD10-I70.0).     08/30/2019 Tumor Marker   Patient's tumor was tested for the following markers: CA-125 Results of the tumor marker test revealed 71.5   08/30/2019 - 05/11/2020 Chemotherapy   The patient had gemzar for chemotherapy treatment.     09/27/2019 Tumor Marker   Patient's tumor was tested for the following markers: CA-125. Results of the tumor marker test revealed 38.5   10/14/2019 Tumor Marker   Patient's tumor was tested for the following markers: CA-125 Results of the tumor marker test revealed 40.8   10/21/2019 Tumor Marker   Patient's tumor was tested for the following markers: CA-125 Results of the tumor marker test revealed 34.8.   11/22/2019 Imaging   1. Interval decrease in size of left upper quadrant cystic and solid peritoneal lesion. 2. No new sites of disease. 3. Unchanged periumbilical hernia containing a nonobstructed loop of small bowel. 4.  Aortic Atherosclerosis (  ICD10-I70.0).     11/25/2019 Tumor Marker   Patient's tumor was tested for the following markers: CA-125 Results of the tumor marker test revealed 22.7   12/23/2019 Tumor Marker   Patient's tumor was tested for the following markers: CA-125 Results of the tumor marker test revealed 26.3   01/20/2020 Tumor Marker   Patient's tumor was tested for the following markers: CA-125 Results of the tumor marker test revealed 28.3   02/14/2020 Imaging   1. 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 2. No new or progressive disease identified within the abdomen or pelvis. 3. Colonic diverticulosis. No radiographic evidence of diverticulitis. 4. Small paraumbilical ventral hernia.     02/17/2020 Tumor Marker   Patient's  tumor was tested for the following markers: CA-125 Results of the tumor marker test revealed 40.1   05/11/2020 Tumor Marker   Patient's tumor was tested for the following markers: CA-125 Results of the tumor marker test revealed 113.   05/22/2020 Imaging   1. 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 2. No evidence of ascites, bowel or ureteral obstruction. 3. 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 No evidence of incarceration or obstruction. 4. Aortic Atherosclerosis (ICD10-I70.0).   05/22/2020 Tumor Marker   Patient's tumor was tested for the following markers: CA-125 Results of the tumor marker test revealed 118   05/29/2020 Echocardiogram   1. Normal GLS -19.8. Left ventricular ejection fraction, by estimation, is 55 to 60%. The left ventricle has normal function. The left ventricle has no regional wall motion abnormalities. Left ventricular diastolic parameters were normal.  2. Right ventricular systolic function is normal. The right ventricular size is normal.  3. The mitral valve is degenerative. Trivial mitral valve regurgitation. No evidence of mitral stenosis.  4. The aortic valve is tricuspid. Aortic valve regurgitation is not visualized. Mild to moderate aortic valve sclerosis/calcification is present, without any evidence of aortic stenosis.  5. The inferior vena cava is normal in size with greater than 50% respiratory variability, suggesting right atrial pressure of 3 mmHg.   06/01/2020 - 10/29/2020 Chemotherapy   The patient had carboplatin and doxil for chemotherapy treatment for 6 cycles    06/01/2020 Tumor Marker   Patient's tumor was tested for the following markers: CA-125 Results of the tumor marker test revealed 126.   06/29/2020 Tumor Marker   Patient's tumor was tested for the following markers: CA-125. Results of  the tumor marker test revealed 108   07/27/2020 Tumor Marker   Patient's tumor was tested for the following markers: CA-125. Results of the tumor marker test revealed 89.1   08/18/2020 Imaging   1. Interval positive response to therapy. 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 No new or progressive metastatic disease in the abdomen or pelvis. 2. Chronic findings include: Small hiatal hernia. Mild to moderate colonic diverticulosis. Aortic Atherosclerosis (ICD10-I70.0).   08/24/2020 Tumor Marker   Patient's tumor was tested for the following markers: CA-125 Results of the tumor marker test revealed 81.2   08/25/2020 Echocardiogram   1. Left ventricular ejection fraction, by estimation, is 60 to 65%. The left ventricle has normal function. The left ventricle has no regional wall motion abnormalities. Left ventricular diastolic parameters were normal. The average left ventricular global longitudinal strain is -21.4 %. The global longitudinal strain is  normal.  2. Right ventricular systolic function is normal. The right ventricular size is normal.  3. The mitral valve is normal in structure. No evidence of mitral valve regurgitation. No evidence of mitral stenosis.  4. The aortic valve is tricuspid. There is moderate calcification of the aortic valve. There is moderate thickening of the aortic valve. Aortic valve regurgitation is not visualized. Mild to moderate aortic valve sclerosis/calcification is present, without any evidence of aortic stenosis.  5. Aortic dilatation noted. There is mild dilatation of the aortic root, measuring 37 mm.  6. The inferior vena cava is normal in size with greater than 50% respiratory variability, suggesting right atrial pressure of 3 mmHg.   09/21/2020 Tumor Marker   Patient's tumor was tested for the following markers: CA-125 Results of the tumor marker test revealed 72.7.   10/01/2020 Tumor Marker    Patient's tumor was tested for the following markers: CA-125 Results of the tumor marker test revealed 80.9   10/29/2020 Tumor Marker   Patient's tumor was tested for the following markers: CA-125 Results of the tumor marker test revealed 88   11/12/2020 Imaging   1. 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 2. Small right paramidline paraumbilical ventral hernia containing a short segment of mid transverse colon, without evidence of bowel incarceration or obstruction at this time. 3. Colonic diverticulosis without evidence of acute diverticulitis at this time. 4. Aortic atherosclerosis, in addition to left main coronary artery disease. Please note that although the presence of coronary artery calcium documents the presence of coronary artery disease, the severity of this disease and any potential stenosis cannot be assessed on this non-gated CT examination. Assessment for potential risk factor modification, dietary therapy or pharmacologic therapy may be warranted, if clinically indicated. 5. Additional incidental findings, as above.   11/26/2020 - 02/11/2021 Chemotherapy          11/26/2020 Tumor Marker   Patient's tumor was tested for the following markers CA-125 Results of the tumor marker test revealed 89.1   12/03/2020 Tumor Marker   Patient's tumor was tested for the following markers: CA-125 Results of the tumor marker test revealed 84.3   02/22/2021 Imaging   1. Enlarging perihepatic/hepatic disease as described. 2. Slight enlargement of perisplenic implant. 3. Tiny implant adjacent to the transverse colon not definitely seen previously, small size makes this fact of uncertain significance. Attention on follow-up. 4. Bilateral sacral insufficiency fractures worse on the RIGHT. Correlate with history of pelvic pain that may be worsening. Some sclerosis seen on prior imaging. Clear fracture line currently demonstrated in the RIGHT hemi sacrum with  developing fracture line on the LEFT. 5. Small anterior abdominal wall hernia containing a portion of the colon, not complicated by obstruction or adjacent stranding. 6. Aortic atherosclerosis.     ALLERGIES:  has No Known Allergies.  MEDICATIONS:  Current Outpatient Medications  Medication Sig Dispense Refill   Cholecalciferol (VITAMIN D) 2000 units CAPS Take 1 capsule by mouth daily.     ezetimibe (ZETIA) 10 MG tablet Take 10 mg by mouth daily. (Patient not taking: Reported on 01/27/2021)     lidocaine-prilocaine (EMLA) cream APPLY TO AFFECTED AREA ONCE 30 g 3   ondansetron (ZOFRAN) 8 MG tablet Take 1 tablet (8 mg total) by mouth 2 (two) times daily as needed (Nausea or vomiting). (Patient not taking: Reported on 01/27/2021) 30 tablet 1   prochlorperazine (COMPAZINE) 10 MG tablet Take 1 tablet (10 mg total) by mouth every 6 (  six) hours as needed (Nausea or vomiting). 60 tablet 1   No current facility-administered medications for this visit.    VITAL SIGNS: BP 136/70 (BP Location: Right Arm, Patient Position: Sitting)   Pulse (!) 110 Comment: Nurse aware of patient pulse  Temp 97.6 F (36.4 C) (Oral)   Resp 18   Wt 132 lb 9.6 oz (60.1 kg)   SpO2 100%   BMI 22.76 kg/m  Filed Weights   08/10/21 1314  Weight: 132 lb 9.6 oz (60.1 kg)    Estimated body mass index is 22.76 kg/m as calculated from the following:   Height as of 02/23/21: $RemoveBef'5\' 4"'aTFnEnmWWr$  (1.626 m).   Weight as of this encounter: 132 lb 9.6 oz (60.1 kg).  LABS: CBC:    Component Value Date/Time   WBC 3.0 (L) 02/11/2021 1245   WBC 2.7 (L) 09/21/2020 0759   HGB 8.2 (L) 02/11/2021 1245   HGB 13.2 11/04/2016 0932   HCT 24.6 (L) 02/11/2021 1245   HCT 40.0 11/04/2016 0932   PLT 224 02/11/2021 1245   PLT 205 11/04/2016 0932   MCV 94.3 02/11/2021 1245   MCV 93.2 11/04/2016 0932   NEUTROABS 1.5 (L) 02/11/2021 1245   NEUTROABS 2.3 11/04/2016 0932   LYMPHSABS 1.0 02/11/2021 1245   LYMPHSABS 1.1 11/04/2016 0932   MONOABS 0.4  02/11/2021 1245   MONOABS 0.3 11/04/2016 0932   EOSABS 0.1 02/11/2021 1245   EOSABS 0.1 11/04/2016 0932   BASOSABS 0.0 02/11/2021 1245   BASOSABS 0.0 11/04/2016 0932   Comprehensive Metabolic Panel:    Component Value Date/Time   NA 137 02/11/2021 1245   NA 139 06/07/2017 1430   K 4.5 02/11/2021 1245   K 3.8 06/07/2017 1430   CL 105 02/11/2021 1245   CO2 24 02/11/2021 1245   CO2 27 06/07/2017 1430   BUN 18 02/11/2021 1245   BUN 14.0 06/07/2017 1430   CREATININE 0.99 02/11/2021 1245   CREATININE 0.8 06/07/2017 1430   GLUCOSE 125 (H) 02/11/2021 1245   GLUCOSE 124 06/07/2017 1430   CALCIUM 9.1 02/11/2021 1245   CALCIUM 9.3 06/07/2017 1430   AST 17 02/11/2021 1245   AST 18 09/30/2016 1027   ALT 13 02/11/2021 1245   ALT 21 09/30/2016 1027   ALKPHOS 103 02/11/2021 1245   ALKPHOS 77 09/30/2016 1027   BILITOT 0.2 (L) 02/11/2021 1245   BILITOT 0.37 09/30/2016 1027   PROT 6.6 02/11/2021 1245   PROT 6.6 09/30/2016 1027   ALBUMIN 3.8 02/11/2021 1245   ALBUMIN 3.5 09/30/2016 1027    RADIOGRAPHIC STUDIES: No results found.  PERFORMANCE STATUS (ECOG) : 1 - Symptomatic but completely ambulatory  Review of Systems  Gastrointestinal:  Positive for abdominal pain.  Neurological:  Positive for weakness.  Unless otherwise noted, a complete review of systems is negative.  Physical Exam General: NAD Cardiovascular: regular rate and rhythm Pulmonary: clear ant fields Abdomen: soft, tender, + bowel sounds Extremities: no edema, no joint deformities Skin: no rashes Neurological: Weakness but otherwise nonfocal, AAO x3, mood appropriate  IMPRESSION:  This is my initial encounter with Jessica Johnston. I introduced myself and Palliative's role in collaboration with her Oncology team. She verbalized understanding and appreciation.   Mrs. Yetman shares she lives in the home with husband of more than 47 years. They have 2 children, 3 grandchildren, and a great grandchild on the way. 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 She and her family loves  boating.   Patient shares she has been following up with Dr. Denman George (GYN Oncologist at [facility]) in addition to Dr. Alvy Bimler. She initially was on hospice back in May 2022 however because she was doing so well this was not warranted at that time.   She reports being independent of all ADLs, actively driving. Appetite has been good. She practices a more holistic approach avoiding sugars and other artificial preservatives. She is following a healthy diet with instructions from her health coach and "Cancer Free with Food" guide.   Patient had recent follow up with Dr. Denman George and CT abdomen and lab work is pending. She is concerned with disease progression as she is now becoming more symptomatic with abdominal pain, discomfort, and weakness. She is clear in expressed goals that she may be interested in a clinical trial if available however she does not wish to undergo treatment or interventions that will interrupt her current quality of life. If a trial was available she would want to learn more about it and understand the possible side effects prior to making a final decision. She does not wish to put her family through any burden of watching her decline due to treatment vs natural disease trajectory.   We discussed at length best case and worst case scenario. She is not opposed to re-evaluating the need in hospice pending future discussions with her Oncology team. She shares her home affairs are in order. Advanced directives are completed. Confirms DNR/DNI.   She is grateful for there life and the time she has been able to spend with her family, including her current quality of life.   Mrs. Gonzalo is complaining of abdominal pain. States her bowel movements are regular. Pain is more on left upper quadrant and radiates to the right. She has been taking Advil or Tylenol with little to no relief. We discussed  use of narcotics to assist with the pain. She states she was having difficulty with ADLs over the past 24-48hrs due to the pain and weakness. She would like to try something for pain. Education provided on use of Oxycodone as needed in addition to bowel regimen to prevent constipation. She verbalized understanding and appreciation.   She is planning to discuss her pain and scans with her family once she follows up with Dr. Denman George and has reviewed results and understand what options if any are available.   All questions answered and support provided.   PLAN: Oxycodone PRN for abdominal pain.  Miralax daily for bowel regimen to prevent constipatin in the setting of opioid use.  Compazine PRN for nausea Patient has upcoming appointment with Dr. Denman George _0  to review scans and determine next steps (focusing on comfort vs optional clinical trial).  Will plan to see back in 2 weeks to evaluate pain and continue ongoing goals of care discussions.    Patient expressed understanding and was in agreement with this plan. She also understands that She can call the clinic at any time with any questions, concerns, or complaints.     Time Total: 55 min.   Visit consisted of counseling and education dealing with the complex and emotionally intense issues of symptom management and palliative care in the setting of serious and potentially life-threatening illness.Greater than 50%  of this time was spent counseling and coordinating care related to the above assessment and plan.  Signed by: Alda Lea, AGPCNP-BC Palliative Medicine Team

## 2021-08-11 ENCOUNTER — Encounter: Payer: Self-pay | Admitting: Hematology and Oncology

## 2021-08-12 ENCOUNTER — Telehealth: Payer: Self-pay

## 2021-08-12 MED FILL — KEYTRUDA 25 MG/ML INTRAVENOUS SOLUTION: 25 mg/mL | INTRAVENOUS | Qty: 8

## 2021-08-12 NOTE — Telephone Encounter (Signed)
Mrs. Leece called this morning regarding her Oxycodone. She said that it wasn't working well. I notified Nikki, NP who stated she could take two Oxycodone if one isn't helping and could take every 4 hours if needed. I called Mrs. Perezperez back and relayed this information. She verbalized understanding. I told her to call us back with any questions or concerns. All questions answered.

## 2021-08-14 DIAGNOSIS — Z23 Encounter for immunization: Secondary | ICD-10-CM | POA: Diagnosis not present

## 2021-08-17 ENCOUNTER — Encounter: Attending: Hematology & Oncology | Primary: Family Medicine

## 2021-08-17 ENCOUNTER — Ambulatory Visit: Payer: MEDICARE | Primary: Family Medicine

## 2021-08-17 LAB — CBC WITH AUTO DIFFERENTIAL
Basophils %: 1 % (ref 0–1)
Basophils Absolute: 0.1 10*3/uL (ref 0.0–0.1)
Eosinophils %: 5 % (ref 0–7)
Eosinophils Absolute: 0.6 10*3/uL — ABNORMAL HIGH (ref 0.0–0.4)
Granulocyte Absolute Count: 0.1 10*3/uL — ABNORMAL HIGH (ref 0.00–0.04)
Hematocrit: 35.3 % (ref 35.0–47.0)
Hemoglobin: 11 g/dL — ABNORMAL LOW (ref 11.5–16.0)
Immature Granulocytes: 1 % — ABNORMAL HIGH (ref 0.0–0.5)
Lymphocytes %: 19 % (ref 12–49)
Lymphocytes Absolute: 2.1 10*3/uL (ref 0.8–3.5)
MCH: 26.3 PG (ref 26.0–34.0)
MCHC: 31.2 g/dL (ref 30.0–36.5)
MCV: 84.4 FL (ref 80.0–99.0)
MPV: 9.8 FL (ref 8.9–12.9)
Monocytes %: 10 % (ref 5–13)
Monocytes Absolute: 1.1 10*3/uL — ABNORMAL HIGH (ref 0.0–1.0)
NRBC Absolute: 0 10*3/uL (ref 0.00–0.01)
Neutrophils %: 64 % (ref 32–75)
Neutrophils Absolute: 6.8 10*3/uL (ref 1.8–8.0)
Nucleated RBCs: 0 PER 100 WBC
Platelets: 468 10*3/uL — ABNORMAL HIGH (ref 150–400)
RBC: 4.18 M/uL (ref 3.80–5.20)
RDW: 14.6 % — ABNORMAL HIGH (ref 11.5–14.5)
WBC: 10.7 10*3/uL (ref 3.6–11.0)

## 2021-08-17 LAB — COMPREHENSIVE METABOLIC PANEL
ALT: 32 U/L (ref 12–78)
AST: 20 U/L (ref 15–37)
Albumin/Globulin Ratio: 0.6 — ABNORMAL LOW (ref 1.1–2.2)
Albumin: 3 g/dL — ABNORMAL LOW (ref 3.5–5.0)
Alkaline Phosphatase: 89 U/L (ref 45–117)
Anion Gap: 5 mmol/L (ref 5–15)
BUN: 40 MG/DL — ABNORMAL HIGH (ref 6–20)
Bun/Cre Ratio: 39 — ABNORMAL HIGH (ref 12–20)
CO2: 30 mmol/L (ref 21–32)
Calcium: 10 MG/DL (ref 8.5–10.1)
Chloride: 100 mmol/L (ref 97–108)
Creatinine: 1.03 MG/DL — ABNORMAL HIGH (ref 0.55–1.02)
ESTIMATED GLOMERULAR FILTRATION RATE: 55 mL/min/{1.73_m2} — ABNORMAL LOW (ref >60)
Globulin: 4.7 g/dL — ABNORMAL HIGH (ref 2.0–4.0)
Glucose: 129 mg/dL — ABNORMAL HIGH (ref 65–100)
Potassium: 3.6 mmol/L (ref 3.5–5.1)
Sodium: 135 mmol/L — ABNORMAL LOW (ref 136–145)
Total Bilirubin: 0.4 MG/DL (ref 0.2–1.0)
Total Protein: 7.7 g/dL (ref 6.4–8.2)

## 2021-08-17 LAB — METABOLIC PANEL, COMPREHENSIVE
A-G Ratio: 0.6 — ABNORMAL LOW (ref 1.1–2.2)
ALT (SGPT): 32 U/L (ref 12–78)
AST (SGOT): 20 U/L (ref 15–37)
Albumin: 3 g/dL — ABNORMAL LOW (ref 3.5–5.0)
Alk. phosphatase: 89 U/L (ref 45–117)
Anion gap: 5 mmol/L (ref 5–15)
BUN/Creatinine ratio: 39 — ABNORMAL HIGH (ref 12–20)
BUN: 40 MG/DL — ABNORMAL HIGH (ref 6–20)
Bilirubin, total: 0.4 mg/dL (ref 0.2–1.0)
CO2: 30 mmol/L (ref 21–32)
Calcium: 10 mg/dL (ref 8.5–10.1)
Chloride: 100 mmol/L (ref 97–108)
Creatinine: 1.03 MG/DL — ABNORMAL HIGH (ref 0.55–1.02)
Globulin: 4.7 g/dL — ABNORMAL HIGH (ref 2.0–4.0)
Glucose: 129 mg/dL — ABNORMAL HIGH (ref 65–100)
Potassium: 3.6 mmol/L (ref 3.5–5.1)
Protein, total: 7.7 g/dL (ref 6.4–8.2)
Sodium: 135 mmol/L — ABNORMAL LOW (ref 136–145)
eGFR: 55 mL/min/{1.73_m2} — ABNORMAL LOW (ref >60)

## 2021-08-17 LAB — CBC WITH AUTOMATED DIFF
ABS. BASOPHILS: 0.1 10*3/uL (ref 0.0–0.1)
ABS. EOSINOPHILS: 0.6 10*3/uL — ABNORMAL HIGH (ref 0.0–0.4)
ABS. IMM. GRANS.: 0.1 10*3/uL — ABNORMAL HIGH (ref 0.00–0.04)
ABS. LYMPHOCYTES: 2.1 10*3/uL (ref 0.8–3.5)
ABS. MONOCYTES: 1.1 10*3/uL — ABNORMAL HIGH (ref 0.0–1.0)
ABS. NEUTROPHILS: 6.8 10*3/uL (ref 1.8–8.0)
ABSOLUTE NRBC: 0 10*3/uL (ref 0.00–0.01)
BASOPHILS: 1 % (ref 0–1)
EOSINOPHILS: 5 % (ref 0–7)
HCT: 35.3 % (ref 35.0–47.0)
HGB: 11 g/dL — ABNORMAL LOW (ref 11.5–16.0)
IMMATURE GRANULOCYTES: 1 % — ABNORMAL HIGH (ref 0.0–0.5)
LYMPHOCYTES: 19 % (ref 12–49)
MCH: 26.3 pg (ref 26.0–34.0)
MCHC: 31.2 g/dL (ref 30.0–36.5)
MCV: 84.4 fL (ref 80.0–99.0)
MONOCYTES: 10 % (ref 5–13)
MPV: 9.8 fL (ref 8.9–12.9)
NEUTROPHILS: 64 % (ref 32–75)
NRBC: 0 /100{WBCs}
PLATELET: 468 10*3/uL — ABNORMAL HIGH (ref 150–400)
RBC: 4.18 M/uL (ref 3.80–5.20)
RDW: 14.6 % — ABNORMAL HIGH (ref 11.5–14.5)
WBC: 10.7 10*3/uL (ref 3.6–11.0)

## 2021-08-17 MED ORDER — HEPARIN, PORCINE (PF) 100 UNIT/ML IV SYRINGE
100 unit/mL | INTRAVENOUS | Status: AC | PRN
Start: 2021-08-17 — End: 2021-08-18
  Administered 2021-08-17: 20:00:00

## 2021-08-17 MED ORDER — SODIUM CHLORIDE 0.9 % IJ SYRG
INTRAMUSCULAR | Status: DC | PRN
Start: 2021-08-17 — End: 2021-08-18

## 2021-08-17 MED ORDER — SODIUM CHLORIDE 0.9 % IV
INTRAVENOUS | Status: AC | PRN
Start: 2021-08-17 — End: 2021-08-18

## 2021-08-17 MED ORDER — SODIUM CHLORIDE 0.9 % INJECTION
INTRAMUSCULAR | Status: DC | PRN
Start: 2021-08-17 — End: 2021-08-18

## 2021-08-17 MED ORDER — WATER FOR INJECTION, STERILE INJECTION
2 mg | INTRAMUSCULAR | Status: DC | PRN
Start: 2021-08-17 — End: 2021-08-18

## 2021-08-17 MED ORDER — SODIUM CHLORIDE 0.9% BOLUS IV
0.9 % | Freq: Once | INTRAVENOUS | Status: AC
Start: 2021-08-17 — End: 2021-08-17
  Administered 2021-08-17: 19:00:00 via INTRAVENOUS

## 2021-08-17 MED ORDER — SODIUM CHLORIDE 0.9 % IV
25 mg/mL | Freq: Once | INTRAVENOUS | Status: AC
Start: 2021-08-17 — End: 2021-08-17
  Administered 2021-08-17: 20:00:00 via INTRAVENOUS

## 2021-08-17 MED ORDER — SODIUM CHLORIDE 0.9 % IV
INTRAVENOUS | Status: AC | PRN
Start: 2021-08-17 — End: 2021-08-18
  Administered 2021-08-17: 19:00:00 via INTRAVENOUS

## 2021-08-17 MED FILL — BD POSIFLUSH NORMAL SALINE 0.9 % INJECTION SYRINGE: INTRAMUSCULAR | Qty: 40

## 2021-08-17 MED FILL — SODIUM CHLORIDE 0.9 % IV: INTRAVENOUS | Qty: 500

## 2021-08-17 MED FILL — SODIUM CHLORIDE 0.9 % IV: INTRAVENOUS | Qty: 1000

## 2021-08-17 NOTE — Progress Notes (Signed)
 Dawn Green is a 80 y.o. female follow up for lung cancer.    1. Have you been to the ER, urgent care clinic since your last visit?  Hospitalized since your last visit?no     2. Have you seen or consulted any other health care providers outside of the [facility] since your last visit?  Include any pap smears or colon screening. No    Vitals 08/17/2021   Pulse 86   Temp 97.9   Resp 18   Height 5' 3   Weight 130 lb   SpO2 98   BSA 1.62 m2   BMI 23.03 kg/m2

## 2021-08-17 NOTE — Progress Notes (Signed)
 [facility] Progress Note    Date: August 17, 2021    Name: Dawn Green    MRN: [id number]         DOB: 09-21-1941    Dawn Green Arrived ambulatory and in no distress for C2D1 of Keytruda Regimen.  Assessment was completed, no acute issues at this time, no new complaints voiced.  Right chest wall port accessed without difficulty, labs drawn & sent for processing.    Chemotherapy Flowsheet 08/17/2021   Cycle C2D1   Date 08/17/2021   Drug / Regimen Keytruda   Pre Hydration given   Notes given        Patient proceed to appointment with Dr. Junie.    Dawn Green's vitals were reviewed.  Visit Vitals  BP (!) 109/58   Pulse 71   Temp 97.9 F (36.6 C)   Resp 18   Ht 5' 3 (1.6 m)   Wt 59 kg (130 lb)   SpO2 98%   BMI 23.03 kg/m       Lab results were obtained and reviewed.  Recent Results (from the past 12 hour(s))   CBC WITH AUTOMATED DIFF    Collection Time: 08/17/21  1:09 PM   Result Value Ref Range    WBC 10.7 3.6 - 11.0 K/uL    RBC 4.18 3.80 - 5.20 M/uL    HGB 11.0 (L) 11.5 - 16.0 g/dL    HCT 64.6 64.9 - 52.9 %    MCV 84.4 80.0 - 99.0 FL    MCH 26.3 26.0 - 34.0 PG    MCHC 31.2 30.0 - 36.5 g/dL    RDW 85.3 (H) 88.4 - 14.5 %    PLATELET 468 (H) 150 - 400 K/uL    MPV 9.8 8.9 - 12.9 FL    NRBC 0.0 0 PER 100 WBC    ABSOLUTE NRBC 0.00 0.00 - 0.01 K/uL    NEUTROPHILS 64 32 - 75 %    LYMPHOCYTES 19 12 - 49 %    MONOCYTES 10 5 - 13 %    EOSINOPHILS 5 0 - 7 %    BASOPHILS 1 0 - 1 %    IMMATURE GRANULOCYTES 1 (H) 0.0 - 0.5 %    ABS. NEUTROPHILS 6.8 1.8 - 8.0 K/UL    ABS. LYMPHOCYTES 2.1 0.8 - 3.5 K/UL    ABS. MONOCYTES 1.1 (H) 0.0 - 1.0 K/UL    ABS. EOSINOPHILS 0.6 (H) 0.0 - 0.4 K/UL    ABS. BASOPHILS 0.1 0.0 - 0.1 K/UL    ABS. IMM. GRANS. 0.1 (H) 0.00 - 0.04 K/UL    DF AUTOMATED     METABOLIC PANEL, COMPREHENSIVE    Collection Time: 08/17/21  1:09 PM   Result Value Ref Range    Sodium 135 (L) 136 - 145 mmol/L    Potassium 3.6 3.5 - 5.1 mmol/L    Chloride 100 97 - 108 mmol/L    CO2 30 21 - 32 mmol/L    Anion gap 5 5 - 15 mmol/L     Glucose 129 (H) 65 - 100 mg/dL    BUN 40 (H) 6 - 20 MG/DL    Creatinine 8.96 (H) 0.55 - 1.02 MG/DL    BUN/Creatinine ratio 39 (H) 12 - 20      eGFR 55 (L) >60 ml/min/1.71m2    Calcium 10.0 8.5 - 10.1 MG/DL    Bilirubin, total 0.4 0.2 - 1.0 MG/DL    ALT (SGPT) 32 12 - 78 U/L  AST (SGOT) 20 15 - 37 U/L    Alk. phosphatase 89 45 - 117 U/L    Protein, total 7.7 6.4 - 8.2 g/dL    Albumin 3.0 (L) 3.5 - 5.0 g/dL    Globulin 4.7 (H) 2.0 - 4.0 g/dL    A-G Ratio 0.6 (L) 1.1 - 2.2         Medications:  Medications Administered       0.9% sodium chloride infusion       Admin Date  08/17/2021 Action  New Bag Dose  25 mL/hr Rate  25 mL/hr Route  IntraVENous Administered By  Paquette, Laurie D, RN              heparin (porcine) pf 500 Units       Admin Date  08/17/2021 Action  Given Dose  500 Units Route  InterCATHeter Administered By  Paquette, Laurie D, RN              pembrolizumab [facility]) 200 mg in 0.9% sodium chloride 100 mL, overfill volume 10 mL IVPB       Admin Date  08/17/2021 Action  New Bag Dose  200 mg Rate  236 mL/hr Route  IntraVENous Administered By  Paquette, Laurie D, RN              sodium chloride 0.9 % bolus infusion 500 mL       Admin Date  08/17/2021 Action  New Bag Dose  500 mL Rate  1,000 mL/hr Route  IntraVENous Administered By  Paquette, Laurie D, RN                          Two nurses verified prior to administering: Drug name, drug dose, infusion volume or drug volume when prepared in a syringe, rate of administration, route of administration,  expiration dates and/or times, appearance and physical integrity of the drugs, rate set on infusion pump, when used sequencing of drug administration.           Dawn Green tolerated treatment well and [facility] at 1610.   Port de-accessed, flushed & heparinized per protocol. She is to return on November 22 at for her next appointment.    Dawn JONETTA Mai, RN  August 17, 2021

## 2021-08-17 NOTE — Progress Notes (Signed)
Progress  Notes by RaddinReuel Derby, MD at 08/17/21 1315                Author: Heloise Purpura, MD  Service: --  Author Type: Physician       Filed: 08/17/21 1443  Encounter Date: 08/17/2021  Status: Signed          Editor: Theodor Mustin, Reuel Derby, MD (Physician)                       [place] [facility]   [address], [address] [place] [address]   W: [phone]  F: [phone]           Reason for Visit:     Dawn Green is a 80 y.o. female who is seen in follow up for evaluation of lung cancer.        Hematology/Oncology Treatment History:     ??  CT Chest 06/11/2021: Large lingular mass, compatible with neoplasm. Bulky mediastinal  and left hilar adenopathy, compatible with metastatic disease.  Small left pleural effusion, likely malignant.   ??  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  CT A/P 07/06/2021: Incompletely visualized 6.4 cm x 4.6 cm lingular soft tissue mass. Trace left pleural fluid.   ??  MRI Brain 07/06/2021: Nonenhancing/minimally enhancing ring lesion of the (hemorrhagic/melanotic) left parietal mass lesion measures 11 x 15 x 16 mm in size. Ring-enhancing lesion with minimal associated  susceptibility. There is no significant midline shift or mass effect. There is no significant associated vasogenic edema. Despite its atypical appearance most likely represents a metastasis, not fully delineated. Mild cerebral atrophy and chronic microvascular  ischemic change. No other evidence of intracranial metastasis is definitively demonstrated.    ??  PET/CT 07/09/2021: Large lingular mass demonstrates increased tracer activity and is compatible with the known neoplasm. Hypermetabolic bilateral  supraclavicular, mediastinal, and left hilar lymphadenopathy. Hypermetabolic bone lesions concerning for metastatic disease.   ??  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  Gamma knife with Dr. Oletta Darter on 07/21/2021   ??  Initiated palliative  systemic therapy with Pembrolizumab on 07/27/2021        History of Present Illness:     Presents for follow up on therapy. Having new itching since last visit. Itching all over. She took a shower this morning and it seems improved. No rash noted. Took one dose of  Benadryl with little relief.       Moderate fatigue. Denies fever or chills. No cough or congestion.       Appetite has been down. No nausea or vomiting.          She was previously seen by Dr. Laurena Bering for breast cancer.  She had a right lumpectomy 04/2013, radiation 06/2023, and anastrozole from 07/2013 to 07/2018.  She was discharged from his follow up 07/2018.      She is accompanied by her husband today.  She lives with her husband and son in [place].  She previously smoked 1ppd, quit 25 years before diagnosis.      Review of systems was obtained and pertinent findings reviewed above. Past medical history, social history, family history, medications, and allergies are located in the electronic medical record.        Physical Exam:     Visit Vitals      BP  (!) 100/46 (BP 1  Location: Left upper arm, BP Patient Position: Sitting)        Pulse  82     Temp  (P) 97.9 ??F (36.6 ??C)     Resp  (P) 18     Ht  (P) 5' 3"  (1.6 m)     Wt  (P) 130 lb (59 kg)     SpO2  (P) 98%        BMI  (P) 23.03 kg/m??        ECOG PS: 1   General: no distress   Eyes: anicteric sclerae   HENT: oropharynx clear   Neck: supple   Lymphatic: no cervical, supraclavicular, or inguinal adenopathy   Respiratory: normal respiratory effort   CV: no peripheral edema   GI: soft, nontender, nondistended, no masses   Skin: no rashes, no ecchymoses, no petechiae   Unstable gait        Results:          Lab Results         Component  Value  Date/Time            WBC  10.7  08/17/2021 01:09 PM       HGB  11.0 (L)  08/17/2021 01:09 PM       HCT  35.3  08/17/2021 01:09 PM       PLATELET  468 (H)  08/17/2021 01:09 PM       MCV  84.4  08/17/2021 01:09 PM            ABS. NEUTROPHILS  6.8  08/17/2021 01:09 PM           Lab Results         Component  Value  Date/Time            Sodium  135 (L)  08/17/2021 01:09 PM       Potassium  3.6  08/17/2021 01:09 PM       Chloride  100  08/17/2021 01:09 PM       CO2  30  08/17/2021 01:09 PM       Glucose  129 (H)  08/17/2021 01:09 PM       BUN  40 (H)  08/17/2021 01:09 PM       Creatinine  1.03 (H)  08/17/2021 01:09 PM       GFR est AA  >60  06/11/2021 10:14 AM       GFR est non-AA  >60  06/11/2021 10:14 AM       Calcium  10.0  08/17/2021 01:09 PM       Glucose (POC)  137 (H)  07/23/2021 12:58 PM            Creatinine (POC)  0.8  03/26/2013 01:52 PM          Lab Results         Component  Value  Date/Time            Bilirubin, total  0.4  08/17/2021 01:09 PM       ALT (SGPT)  32  08/17/2021 01:09 PM       Alk. phosphatase  89  08/17/2021 01:09 PM       Protein, total  7.7  08/17/2021 01:09 PM       Albumin  3.0 (L)  08/17/2021 01:09 PM            Globulin  4.7 (H)  08/17/2021 01:09 PM  Test results above have been reviewed.        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  Imaging notes extensive disease within her left lung, bilateral supraclavicular and mediastinal nodes, bones, and brain.  Her cancer is  not curable and management is with palliative intent.      Due to high PDL1, she initiated therapy with single agent pembrolizumab.        Tolerated C1 well with mild itching. Will proceed with C2 today as ordered.       She has elected against enrollment in the DIRECT study.        [employer] was unable to be run on sample.        2) Brain metastasis   Isolated metastasis on MRI.  She was seen by Dr. Oletta Darter (neurosurgery) and underwent gamma knife on 10/5. MRI done at time of procedure with 2 new lesions, uncertain if cancer lesions vs infarcts.  She has been referred to cardiology.       3) Cough   Taking cough syrup PRN.      4) H/o breast cancer   S/p  lumpectomy, radiation, and endocrine therapy.      5) Weight loss   Dietician supportive phone call on 07/16/2021. Continue 2 boosts/daily. Encouraged increasing fluid/caloric intake.  Monitor on therapy.       6) Emotional Well Being   No psychosocial concerns identified today. Patient has adequate support.       7) Hypotension   Reports poor fluid intake, she is working on this. Will give 538m NS bolus today. Now off HCTZ.         Plan:        ??  Proceed today with C2 Pembrolizumab (2019m given every 3 weeks   ??  Labs: CBC, CMP prior to each cycle, TSH every 6 weeks   ??  NS fluid bolus today   ??  Follow up every 3 weeks      I personally saw and evaluated the patient and performed the key components of medical decision making.  The history, physical exam, and documentation were performed by MeLucrezia EuropeNP.  I reviewed and verified the above documentation and modified it  as needed. She is tolerating systemic therapy with manageable toxicity, so we will proceed with treatment.            Signed By:  RyHeloise PurpuraMD

## 2021-08-17 NOTE — Progress Notes (Signed)
Dawn Green is a 80 y.o. female follow up for lung cancer.    1. Have you been to the ER, urgent care clinic since your last visit?  Hospitalized since your last visit?no    2. Have you seen or consulted any other health care providers outside of the [facility] since your last visit?  Include any pap smears or colon screening. no

## 2021-08-18 NOTE — Telephone Encounter (Signed)
Spoke to patient's husband regarding lab work from yesterday showing some concern for dehydration. We reviewed this in visit yesterday and she was given 586ml NS bolus.     Reiterated need to increase oral hydration in the home.     Patient's husband states he thinks she "got the message" yesterday. After IV fluids she felt better than she had in months and now understands the importance of getting more fluids in.     Encouraged them to call if oral hydration backs down at all to add for IV hydration during the rest week.

## 2021-08-23 ENCOUNTER — Telehealth (HOSPITAL_BASED_OUTPATIENT_CLINIC_OR_DEPARTMENT_OTHER): Payer: Medicare Other | Admitting: Nurse Practitioner

## 2021-08-23 DIAGNOSIS — Z515 Encounter for palliative care: Secondary | ICD-10-CM

## 2021-08-23 DIAGNOSIS — G893 Neoplasm related pain (acute) (chronic): Secondary | ICD-10-CM | POA: Diagnosis not present

## 2021-08-23 DIAGNOSIS — R112 Nausea with vomiting, unspecified: Secondary | ICD-10-CM | POA: Diagnosis not present

## 2021-08-23 DIAGNOSIS — C762 Malignant neoplasm of abdomen: Secondary | ICD-10-CM

## 2021-08-23 DIAGNOSIS — K5900 Constipation, unspecified: Secondary | ICD-10-CM

## 2021-08-23 DIAGNOSIS — R110 Nausea: Secondary | ICD-10-CM

## 2021-08-23 NOTE — Telephone Encounter (Addendum)
I connected with Jessica Johnston on 08/23/2021 at  by phone and verified that I am speaking with the correct person using two identifiers.   I discussed the limitations, risks, security and privacy concerns of performing an evaluation and management service by telemedicine and the availability of in-person appointments. I also discussed with the patient that there may be a patient responsible charge related to this service. The patient expressed understanding and agreed to proceed.   Other persons participating in the visit and their role in the encounter: N/A   Patient's location: Home   Provider's location: [place]    Chief Complaint: Symptom Management    I received a call from Jessica Johnston sharing concerns of nausea, vomiting, and constipation. She is also being managed by [facility] (Dr. Denman George) for her metastatic cancer.   Jessica Johnston has been taking her Oxycodone for pain. She also has been taking Miralax twice daily for constipation. Unfortunately despite use she has not had a bowel movement in more than 4 days. Today after taking her compazine and Oxycodone she became nauseated and threw both of them up. Denies fever or abdominal pain.   Advised to continue Miralax. She also is preparing to take [place] as advised by her providers at [facility] but also requested to follow-up with concerns of possible obstruction.   Patient has a scheduled appointment tomorrow with me. We will plan to see her sooner than currently scheduled. Advised to proceed with bowel regimen with hopes of facilitating a bowel movement. If no BM prior to visit tomorrow will consider abdominal x-ray to rule out obstruction.   Of note Dr. Denman George refilled patient's pain medication as she had run out. Unfortunately I never received notification or a message regarding patient needing a refill.   All questions answered with plans to see patient as scheduled on tomorrow 08/24/2021.   Alda Lea,  AGPCNP-BC Palliative Medicine [phone]

## 2021-08-24 ENCOUNTER — Other Ambulatory Visit: Payer: Self-pay

## 2021-08-24 ENCOUNTER — Inpatient Hospital Stay: Payer: Medicare Other | Attending: Nurse Practitioner | Admitting: Nurse Practitioner

## 2021-08-24 VITALS — BP 132/54 | HR 96 | Temp 97.8°F | Resp 18 | Wt 125.1 lb

## 2021-08-24 DIAGNOSIS — R109 Unspecified abdominal pain: Secondary | ICD-10-CM | POA: Insufficient documentation

## 2021-08-24 DIAGNOSIS — C786 Secondary malignant neoplasm of retroperitoneum and peritoneum: Secondary | ICD-10-CM | POA: Diagnosis not present

## 2021-08-24 DIAGNOSIS — R531 Weakness: Secondary | ICD-10-CM

## 2021-08-24 DIAGNOSIS — K5900 Constipation, unspecified: Secondary | ICD-10-CM | POA: Insufficient documentation

## 2021-08-24 DIAGNOSIS — C762 Malignant neoplasm of abdomen: Secondary | ICD-10-CM

## 2021-08-24 DIAGNOSIS — R110 Nausea: Secondary | ICD-10-CM | POA: Diagnosis not present

## 2021-08-24 DIAGNOSIS — C5702 Malignant neoplasm of left fallopian tube: Secondary | ICD-10-CM | POA: Diagnosis not present

## 2021-08-24 DIAGNOSIS — G893 Neoplasm related pain (acute) (chronic): Secondary | ICD-10-CM

## 2021-08-24 DIAGNOSIS — Z515 Encounter for palliative care: Secondary | ICD-10-CM

## 2021-08-24 DIAGNOSIS — Z66 Do not resuscitate: Secondary | ICD-10-CM | POA: Insufficient documentation

## 2021-08-24 NOTE — Progress Notes (Signed)
[facility]  [phone]) [phone] [phone]) [phone]   Name: Jessica Johnston Date: 08/24/2021 MRN: [id number]  DOB: Sep 12, 1941  Patient Care Team: [name], MD as PCP - General (Internal Medicine)     Jessica Johnston is a 80 y.o. 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 Roanna Raider Onc), anemia, history of melanoma, acquired pancytopenia, and CKD III .  Palliative ask to see for symptom management and goals of care.  SOCIAL HISTORY:     reports that she has never smoked. She has never used smokeless tobacco. She reports current alcohol use of about 9.0 standard drinks per week. She reports that she does not use drugs.  ADVANCE DIRECTIVES:  Patient states she has a completed advanced directive. This has been recently updated and family is aware of documents. Her husband is her Scientist, research (medical).   CODE STATUS: DNR  PAST MEDICAL HISTORY: Past Medical History:  Diagnosis Date   Arthritis    arthritis -hands-knees.   Cancer (HCC)    MELANOMA. multiple x5 different sites. last 10 yrs ago.   Headache    past hx.-no in awhile.   High cholesterol    Melanoma ([place])    Osteopenia    Skin cancer    melanoma    PAST SURGICAL HISTORY:  Past Surgical History:  Procedure Laterality Date   ABDOMINAL HYSTERECTOMY N/A 02/18/2016   Procedure: HYSTERECTOMY ABDOMINAL;  Surgeon: Everitt Amber, MD;  Location: [facility];  Service: Gynecology;  Laterality: N/A;   DEBULKING N/A 02/18/2016   Procedure: RADICAL TUMOR DEBULKING;  Surgeon: Everitt Amber, MD;  Location: [facility];  Service: Gynecology;  Laterality: N/A;   HYSTEROSCOPY     LAPAROTOMY N/A 02/18/2016   Procedure: EXPLORATORY LAPAROTOMY;  Surgeon: Everitt Amber, MD;  Location: [facility];  Service: Gynecology;  Laterality: N/A;    omental mass     Biopsy, CT guided   OMENTECTOMY N/A 02/18/2016   Procedure: OMENTECTOMY;  Surgeon: Everitt Amber, MD;  Location: [facility];  Service: Gynecology;  Laterality: N/A;   SALPINGOOPHORECTOMY Bilateral 02/18/2016   Procedure: BILATERAL SALPINGO OOPHORECTOMY;  Surgeon: Everitt Amber, MD;  Location: [facility];  Service: Gynecology;  Laterality: Bilateral;   TONSILLECTOMY AND ADENOIDECTOMY  1948    HEMATOLOGY/ONCOLOGY HISTORY:  Oncology History Overview Note  High grade serous, left fallopian Neg genetics from germline mutation or tumor ER 20% PR 0% MMR normal MSI stable Patient self discontinued Niraparib Progressed on Taxol, Avastin, Gemzar, carboplatin, doxil and topotecan      Fallopian tube cancer, carcinoma ([place])  08/06/2014 Tumor Marker   Patient's tumor was tested for the following markers: CA-125 Results of the tumor marker test revealed 49   01/27/2016 Imaging   1. Extensive omental nodularity highly worrisome for peritoneal carcinomatosis. Late recurrence of melanoma can present as peritoneal carcinomatosis. Ovarian cancer more commonly presents in this manner. Patient does have a predominately low density right adnexal lesion measuring up to 3.5 cm, although this lesion is not typical for ovarian cancer. 2. No evidence of bowel or ureteral obstruction. 3. No other definite signs of metastatic disease. There are small indeterminate low-density hepatic lesions.   02/08/2016 Tumor Marker   Patient's tumor was tested for the following markers: CA-125 Results of the tumor marker test revealed 656.2   02/15/2016 Procedure   CT-guided core  biopsy performed of omental mass just deep to the abdominal wall.   02/15/2016 Pathology Results   Omentum, biopsy, left - PAPILLARY SEROUS NEOPLASM, SEE COMMENT. Microscopic Comment There are papillary collections of largely low grade appearing cells with numerous psammoma bodies. Given the limited material it is difficult to distinguish between  invasive implants of a serous borderline tumor and serous carcinoma, especially given the low grade appearance.   02/18/2016 Pathology Results   1. Omentum, resection for tumor INVASIVE IMPLANT OF HIGH GRADE SEROUS CARCINOMA 2. Uterus +/- tubes/ovaries, neoplastic HIGH GRADE SEROUS CARCINOMA INVOLVING LEFT TUBAL FIMBRIA SEROUS CARCINOMA WITH PREDOMINANT PSAMMOMA BODIES IMPLANT AT UTERUS SEROSA, BILATERAL FALLOPIAN TUBAL SEROSA, AND ANTERIOR PERITONEAL REFLECTION CERVIX: HISTOLOGICAL UNREMARKABLE ENDOMETRIUM: INACTIVE ENDOMETRIUM MYOMETRIUM: LEIOMYOMA LEFT OVARY: CYSTADENOFIBROMA RIGHT OVARY AND FALLOPIAN TUBE: HISTOLOGICAL UNREMARKABLE Microscopic Comment 2. ONCOLOGY TABLE - FALLOPIAN TUBE 1. Specimen, including laterality: Omentum, uterus, bilateral ovaries and fallopian tubes 2. Procedure: Hysterectomy, bilateral salpingo-oophorectomy and tumor debulking omentectomy 3. Lymph node sampling performed: No 4. Tumor site: uterus serosa, bilateral fallopian tubal serosa, peritoneal and omentum 5. Tumor location in fallopian tube: Left fallopian tubal fimbria 6. Specimen integrity (intact/ruptured/disrupted): Intact 7. Tumor size (cm): multi focal invasive omentum implant greater than 2 cm, left fallopian tube tumor 0.8 cm. 8. Histologic type: Serous carcinoma 9. Grade: 3 10. Microscopic tumor extension: uterus serosa, bilateral fallopian tubal serosa, peritoneal and omentum 11. Margins: NA 12. Lymph-Vascular invasion: identified 13. Lymph nodes: # examined: 0; # positive: NA 14. TNM: pT3c, pNx 15. FIGO Stage (based on pathologic findings, needs clinical correlation: IIIC 16. Comment: High grade serous carcinoma multifocally and extensively involves left fallopian tubal fimbria, omentum, uterus serosa, bilateral fallopian tubal serosa and peritoneal. At the left fallopian tube there are small foci of serous tubal intraepithelial carcinoma identified, so we conclude the carcinoma is fallopian  tube origin   03/09/2016 Tumor Marker   Patient's tumor was tested for the following markers: CA-125 Results of the tumor marker test revealed 154.4   03/15/2016 Procedure   Technically successful right IJ power-injectable port catheter placement. Ready for routine use.   03/18/2016 - 07/13/2016 Chemotherapy   The patient had 6 cycles of carboplatin and taxol   04/14/2016 Tumor Marker   Patient's tumor was tested for the following markers: CA-125 Results of the tumor marker test revealed 36.7   04/25/2016 Genetic Testing   Patient has genetic testing done for germline mutation Results revealed patient has no mutation   04/28/2016 Tumor Marker   Patient's tumor was tested for the following markers: CA-125 Results of the tumor marker test revealed 24.6   06/21/2016 Tumor Marker   Patient's tumor was tested for the following markers: CA-125 Results of the tumor marker test revealed 16.6   08/01/2016 Tumor Marker   Patient's tumor was tested for the following markers: CA-125 Results of the tumor marker test revealed 17.2   08/05/2016 Imaging   Interval TAH-BSO. No evidence of residual pelvic mass or metastatic disease within the abdomen or pelvis. No other acute findings.     11/04/2016 Tumor Marker   Patient's tumor was tested for the following markers: CA-125 Results of the tumor marker test revealed 13.6   01/20/2017 Tumor Marker   Patient's tumor was tested for the following markers: CA-125 Results of the tumor marker test revealed 13.8   04/14/2017 Tumor Marker   Patient's tumor was tested for the following markers: CA-125 Results of the tumor marker test revealed 16.1   06/13/2017 Imaging  No acute findings.  No mass or hernia identified.   Colonic diverticulosis, without radiographic evidence of diverticulitis.   Aortic atherosclerosis.   07/03/2017 Tumor Marker   Patient's tumor was tested for the following markers: CA-125 Results of the tumor marker test revealed 18.4    09/19/2017 Tumor Marker   Patient's tumor was tested for the following markers: CA-125 Results of the tumor marker test revealed 18.5   01/23/2018 Tumor Marker   Patient's tumor was tested for the following markers: CA-125 Results of the tumor marker test revealed 35.4   01/30/2018 Imaging   1. No evidence of local fallopian tube carcinoma recurrence within the pelvis. Post hysterectomy. 2. No evidence of metastatic peritoneal disease or omental disease. No solid organ metastasis.     03/20/2018 Tumor Marker   Patient's tumor was tested for the following markers: CA-125 Results of the tumor marker test revealed 73   05/02/2018 Tumor Marker   Patient's tumor was tested for the following markers: CA-125 Results of the tumor marker test revealed 127.1   05/08/2018 Imaging   CT abdomen and pelvis 1. 7 cm left subdiaphragmatic collection of loculated fluid versus low-density soft tissue. Given the patient's history of rising CA 125, metastatic disease considered highly likely.  2. Areas of fluid or recurrent disease identified in the right lower quadrant adjacent to the cecum and along right lower quadrant small bowel loops.   05/14/2018 Tumor Marker   Patient's tumor was tested for the following markers: CA-125 Results of the tumor marker test revealed 166    Genetic Testing   Patient has genetic testing done for ER/PR and MMR. Results revealed patient has the following on 02/18/2016 surgical pathology: ER 20%, PR 0% MMR: normal    Genetic Testing   Patient has genetic testing done for MSI. Results revealed patient has the following: MSI stable   05/18/2018 - 12/31/2018 Chemotherapy   The patient had carboplatin and Doxil   07/16/2018 Tumor Marker   Patient's tumor was tested for the following markers: CA-125 Results of the tumor marker test revealed 28.7   08/10/2018 Imaging   CT imaging:  Decreased size of peritoneal soft tissue masses in left upper quadrant, consistent with  decreased metastatic disease.  No new or progressive metastatic disease.  No other acute findings.  Colonic diverticulosis, without radiographic evidence of diverticulitis.  Stable small paraumbilical ventral hernia containing transverse colon.   08/13/2018 Tumor Marker   Patient's tumor was tested for the following markers: CA-125 Results of the tumor marker test revealed 21.6   08/24/2018 Echocardiogram   LV EF: 60% -   65%   10/15/2018 Tumor Marker   Patient's tumor was tested for the following markers: CA-125 Results of the tumor marker test revealed 20.1   10/19/2018 Imaging   Ct scan of abdomen and pelvis Status post hysterectomy, bilateral salpingo-oophorectomy, and omentectomy.   Two peritoneal implants in the left upper abdomen are stable versus mildly decreased, as above. No abdominopelvic ascites.   No evidence of new/progressive metastatic disease.       11/19/2018 Tumor Marker   Patient's tumor was tested for the following markers: CA-125 Results of the tumor marker test revealed 21.7   11/23/2018 Echocardiogram   1. The left ventricle has normal systolic function of 61-44%. The cavity size was normal. There is no increased left ventricular wall thickness. Echo evidence of impaired diastolic relaxation.  2. The right ventricle has normal systolic function. The cavity was normal. There is no increase  in right ventricular wall thickness.  3. The mitral valve is normal in structure. There is mild mitral annular calcification present.  4. The tricuspid valve is normal in structure.  5. The aortic valve is tricuspid There is mild thickening of the aortic valve.  6. The pulmonic valve was normal in structure. Pulmonic valve regurgitation is mild by color flow Doppler.  7. Normal LV systolic function; mild diastolic dysfunction.   12/31/2018 Tumor Marker   Patient's tumor was tested for the following markers: CA-125 Results of the tumor marker test revealed 17.1    01/31/2019 Imaging   1. Left upper quadrant peritoneal implants described previously have decreased in the interval. No new or progressive findings in the abdomen/pelvis today. No free fluid. 2. Right paraumbilical ventral hernia contains a small knuckle of transverse colon without complicating features. 3.  Aortic Atherosclerois (ICD10-170.0)   Aortic Atherosclerosis (ICD10-I70.0).   01/31/2019 Tumor Marker   Patient's tumor was tested for the following markers: CA-125 Results of the tumor marker test revealed 18.9   02/11/2019 - 03/13/2019 Chemotherapy   The patient is taking Niraparib. She self discontinued after 1 month   02/25/2019 Tumor Marker   Patient's tumor was tested for the following markers: CA-125 Results of the tumor marker test revealed 18.6   05/13/2019 Tumor Marker   Patient's tumor was tested for the following markers: CA-125 Results of the tumor marker test revealed 230   05/22/2019 Imaging   1. Increased size of 4.6 cm soft tissue mass in the gastrosplenic ligament, consistent with metastatic disease. 2. No other sites of metastatic disease identified within the abdomen or pelvis. 3. Colonic diverticulosis. No radiographic evidence of diverticulitis. 4. Stable small right paraumbilical hernia containing transverse colon.   Aortic Atherosclerosis (ICD10-I70.0).   05/31/2019 - 08/22/2019 Chemotherapy   The patient had bevacizumab and Taxol for chemotherapy treatment.     05/31/2019 Tumor Marker   Patient's tumor was tested for the following markers: CA-125 Results of the tumor marker test revealed 84   06/21/2019 Tumor Marker   Patient's tumor was tested for the following markers: CA-125 Results of the tumor marker test revealed 49.4.   07/19/2019 Tumor Marker   Patient's tumor was tested for the following markers: CA-125. Results of the tumor marker test revealed 44.5   08/22/2019 Imaging   1. Increase in size of 5.0 cm mass in the gastro splenic ligament  consistent with metastatic disease. 2. No additional sites of disease identified within the abdomen or pelvis. 3. Unchanged paraumbilical hernia containing nonobstructed loop of large bowel. 4.  Aortic Atherosclerosis (ICD10-I70.0).     08/30/2019 Tumor Marker   Patient's tumor was tested for the following markers: CA-125 Results of the tumor marker test revealed 71.5   08/30/2019 - 05/11/2020 Chemotherapy   The patient had gemzar for chemotherapy treatment.     09/27/2019 Tumor Marker   Patient's tumor was tested for the following markers: CA-125. Results of the tumor marker test revealed 38.5   10/14/2019 Tumor Marker   Patient's tumor was tested for the following markers: CA-125 Results of the tumor marker test revealed 40.8   10/21/2019 Tumor Marker   Patient's tumor was tested for the following markers: CA-125 Results of the tumor marker test revealed 34.8.   11/22/2019 Imaging   1. Interval decrease in size of left upper quadrant cystic and solid peritoneal lesion. 2. No new sites of disease. 3. Unchanged periumbilical hernia containing a nonobstructed loop of small bowel. 4.  Aortic Atherosclerosis (  ICD10-I70.0).     11/25/2019 Tumor Marker   Patient's tumor was tested for the following markers: CA-125 Results of the tumor marker test revealed 22.7   12/23/2019 Tumor Marker   Patient's tumor was tested for the following markers: CA-125 Results of the tumor marker test revealed 26.3   01/20/2020 Tumor Marker   Patient's tumor was tested for the following markers: CA-125 Results of the tumor marker test revealed 28.3   02/14/2020 Imaging   1. 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 2. No new or progressive disease identified within the abdomen or pelvis. 3. Colonic diverticulosis. No radiographic evidence of diverticulitis. 4. Small paraumbilical ventral hernia.     02/17/2020 Tumor Marker   Patient's tumor was tested for the  following markers: CA-125 Results of the tumor marker test revealed 40.1   05/11/2020 Tumor Marker   Patient's tumor was tested for the following markers: CA-125 Results of the tumor marker test revealed 113.   05/22/2020 Imaging   1. 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 2. No evidence of ascites, bowel or ureteral obstruction. 3. 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 No evidence of incarceration or obstruction. 4. Aortic Atherosclerosis (ICD10-I70.0).   05/22/2020 Tumor Marker   Patient's tumor was tested for the following markers: CA-125 Results of the tumor marker test revealed 118   05/29/2020 Echocardiogram   1. Normal GLS -19.8. Left ventricular ejection fraction, by estimation, is 55 to 60%. The left ventricle has normal function. The left ventricle has no regional wall motion abnormalities. Left ventricular diastolic parameters were normal.  2. Right ventricular systolic function is normal. The right ventricular size is normal.  3. The mitral valve is degenerative. Trivial mitral valve regurgitation. No evidence of mitral stenosis.  4. The aortic valve is tricuspid. Aortic valve regurgitation is not visualized. Mild to moderate aortic valve sclerosis/calcification is present, without any evidence of aortic stenosis.  5. The inferior vena cava is normal in size with greater than 50% respiratory variability, suggesting right atrial pressure of 3 mmHg.   06/01/2020 - 10/29/2020 Chemotherapy   The patient had carboplatin and doxil for chemotherapy treatment for 6 cycles    06/01/2020 Tumor Marker   Patient's tumor was tested for the following markers: CA-125 Results of the tumor marker test revealed 126.   06/29/2020 Tumor Marker   Patient's tumor was tested for the following markers: CA-125. Results of the tumor marker test  revealed 108   07/27/2020 Tumor Marker   Patient's tumor was tested for the following markers: CA-125. Results of the tumor marker test revealed 89.1   08/18/2020 Imaging   1. Interval positive response to therapy. 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 No new or progressive metastatic disease in the abdomen or pelvis. 2. Chronic findings include: Small hiatal hernia. Mild to moderate colonic diverticulosis. Aortic Atherosclerosis (ICD10-I70.0).   08/24/2020 Tumor Marker   Patient's tumor was tested for the following markers: CA-125 Results of the tumor marker test revealed 81.2   08/25/2020 Echocardiogram   1. Left ventricular ejection fraction, by estimation, is 60 to 65%. The left ventricle has normal function. The left ventricle has no regional wall motion abnormalities. Left ventricular diastolic parameters were normal. The average left ventricular global longitudinal strain is -21.4 %. The global longitudinal strain is  normal.  2. Right ventricular systolic function is normal. The right ventricular size is normal.  3. The mitral valve is normal in structure. No evidence of mitral valve regurgitation. No evidence of mitral stenosis.  4. The aortic valve is tricuspid. There is moderate calcification of the aortic valve. There is moderate thickening of the aortic valve. Aortic valve regurgitation is not visualized. Mild to moderate aortic valve sclerosis/calcification is present, without any evidence of aortic stenosis.  5. Aortic dilatation noted. There is mild dilatation of the aortic root, measuring 37 mm.  6. The inferior vena cava is normal in size with greater than 50% respiratory variability, suggesting right atrial pressure of 3 mmHg.   09/21/2020 Tumor Marker   Patient's tumor was tested for the following markers: CA-125 Results of the tumor marker test revealed 72.7.   10/01/2020 Tumor Marker   Patient's tumor was  tested for the following markers: CA-125 Results of the tumor marker test revealed 80.9   10/29/2020 Tumor Marker   Patient's tumor was tested for the following markers: CA-125 Results of the tumor marker test revealed 88   11/12/2020 Imaging   1. 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 2. Small right paramidline paraumbilical ventral hernia containing a short segment of mid transverse colon, without evidence of bowel incarceration or obstruction at this time. 3. Colonic diverticulosis without evidence of acute diverticulitis at this time. 4. Aortic atherosclerosis, in addition to left main coronary artery disease. Please note that although the presence of coronary artery calcium documents the presence of coronary artery disease, the severity of this disease and any potential stenosis cannot be assessed on this non-gated CT examination. Assessment for potential risk factor modification, dietary therapy or pharmacologic therapy may be warranted, if clinically indicated. 5. Additional incidental findings, as above.   11/26/2020 - 02/11/2021 Chemotherapy          11/26/2020 Tumor Marker   Patient's tumor was tested for the following markers CA-125 Results of the tumor marker test revealed 89.1   12/03/2020 Tumor Marker   Patient's tumor was tested for the following markers: CA-125 Results of the tumor marker test revealed 84.3   02/22/2021 Imaging   1. Enlarging perihepatic/hepatic disease as described. 2. Slight enlargement of perisplenic implant. 3. Tiny implant adjacent to the transverse colon not definitely seen previously, small size makes this fact of uncertain significance. Attention on follow-up. 4. Bilateral sacral insufficiency fractures worse on the RIGHT. Correlate with history of pelvic pain that may be worsening. Some sclerosis seen on prior imaging. Clear fracture line currently demonstrated in the RIGHT hemi sacrum with developing fracture  line on the LEFT. 5. Small anterior abdominal wall hernia containing a portion of the colon, not complicated by obstruction or adjacent stranding. 6. Aortic atherosclerosis.     ALLERGIES:  has No Known Allergies.  MEDICATIONS:  Current Outpatient Medications  Medication Sig Dispense Refill   oxyCODONE (OXY IR/ROXICODONE) 5 MG immediate release tablet Take 1 tablet (5 mg total) by mouth every 6 (six) hours as needed for severe pain or moderate pain. (Patient taking differently: Take 10 mg by mouth every 4 (four) hours as needed for severe pain or moderate pain.) 30 tablet 0   prochlorperazine (COMPAZINE) 10 MG tablet Take 1 tablet (10 mg total) by mouth every 6 (six) hours as needed (Nausea or vomiting). 60 tablet 1   Cholecalciferol (VITAMIN D) 2000 units CAPS Take 1 capsule by mouth daily.     Cyanocobalamin (VITAMIN  B 12 PO) Take by mouth.     ezetimibe (ZETIA) 10 MG tablet Take 10 mg by mouth daily. (Patient not taking: No sig reported)     lidocaine-prilocaine (EMLA) cream APPLY TO AFFECTED AREA ONCE (Patient not taking: Reported on 08/10/2021) 30 g 3   No current facility-administered medications for this visit.    VITAL SIGNS: BP (!) 132/54 (BP Location: Right Arm, Patient Position: Sitting)   Pulse 96   Temp 97.8 F (36.6 C) (Oral)   Resp 18   Wt 125 lb 1.6 oz (56.7 kg)   SpO2 100%   BMI 21.47 kg/m  Filed Weights   08/24/21 1041  Weight: 125 lb 1.6 oz (56.7 kg)    Estimated body mass index is 21.47 kg/m as calculated from the following:   Height as of 02/23/21: _0  (1.626 m).   Weight as of this encounter: 125 lb 1.6 oz (56.7 kg).  LABS: CBC:    Component Value Date/Time   WBC 3.0 (L) 02/11/2021 1245   WBC 2.7 (L) 09/21/2020 0759   HGB 8.2 (L) 02/11/2021 1245   HGB 13.2 11/04/2016 0932   HCT 24.6 (L) 02/11/2021 1245   HCT 40.0 11/04/2016 0932   PLT 224 02/11/2021 1245   PLT 205 11/04/2016 0932   MCV 94.3 02/11/2021 1245   MCV 93.2 11/04/2016 0932    NEUTROABS 1.5 (L) 02/11/2021 1245   NEUTROABS 2.3 11/04/2016 0932   LYMPHSABS 1.0 02/11/2021 1245   LYMPHSABS 1.1 11/04/2016 0932   MONOABS 0.4 02/11/2021 1245   MONOABS 0.3 11/04/2016 0932   EOSABS 0.1 02/11/2021 1245   EOSABS 0.1 11/04/2016 0932   BASOSABS 0.0 02/11/2021 1245   BASOSABS 0.0 11/04/2016 0932   Comprehensive Metabolic Panel:    Component Value Date/Time   NA 137 02/11/2021 1245   NA 139 06/07/2017 1430   K 4.5 02/11/2021 1245   K 3.8 06/07/2017 1430   CL 105 02/11/2021 1245   CO2 24 02/11/2021 1245   CO2 27 06/07/2017 1430   BUN 18 02/11/2021 1245   BUN 14.0 06/07/2017 1430   CREATININE 0.99 02/11/2021 1245   CREATININE 0.8 06/07/2017 1430   GLUCOSE 125 (H) 02/11/2021 1245   GLUCOSE 124 06/07/2017 1430   CALCIUM 9.1 02/11/2021 1245   CALCIUM 9.3 06/07/2017 1430   AST 17 02/11/2021 1245   AST 18 09/30/2016 1027   ALT 13 02/11/2021 1245   ALT 21 09/30/2016 1027   ALKPHOS 103 02/11/2021 1245   ALKPHOS 77 09/30/2016 1027   BILITOT 0.2 (L) 02/11/2021 1245   BILITOT 0.37 09/30/2016 1027   PROT 6.6 02/11/2021 1245   PROT 6.6 09/30/2016 1027   ALBUMIN 3.8 02/11/2021 1245   ALBUMIN 3.5 09/30/2016 1027    RADIOGRAPHIC STUDIES: No results found.  PERFORMANCE STATUS (ECOG) : 1 - Symptomatic but completely ambulatory  Review of Systems  Gastrointestinal:  Positive for abdominal pain.  Neurological:  Positive for weakness.  Unless otherwise noted, a complete review of systems is negative.  Physical Exam General: NAD Pulmonary: clear ant fields, normal breathing pattern Abdomen: soft, nontender, mildly distended, + bowel sounds Neurological: AAO x3, mood appropriate   IMPRESSION:  Jessica Johnston presents to clinic today accommodated by her husband.  No acute distress noted.  Jessica Johnston recently had an encounter at [facility] with [place], NP.  Further discussion patient wishes to maintain quality of life versus the risk of side effects related to further  treatment options.  [facility] medical team has no  plans on pursuing foundation 1 testing.  Patient has a follow-up appointment with them on 11/14 for ongoing discussions.  Of note [place], NP attempted to reach our palliative team to assist in patient's oxycodone refill and due to no response she was agreeable to refill prescription.  Unfortunately I am unsure what method of contact took place as I never received a message or any forms of communication with request.  Jessica Johnston and I discussed at length and she has been provided with my nurses phone number for future references.  She continues to complain of abdominal pain, constipation, and occasional nausea.  Endorses relief with Compazine and with use of current oxycodone 10 mg.  Patient states she is taking oxycodone approximately every 4 hours as needed with relief.  Unfortunately she has not had a bowel movement in more than 5 days despite the use of MiraLAX twice a day. She initially planned to use milk of magnesia however due to concerns of nausea or vomiting decided not to. We discussed at length continuing Miralax, including colace, and for immediate response the use of magnesium citrate. Patient and husband verbalized understanding and appreciation.   Jessica Johnston advise to contact office if no bowel movement by Friday. We discussed abdominal imaging to rule out obstruction however patient is able to tolerate meals and is passing gas. She would like to continue with regimen and can consider further work-up if no improvement.   All questions answered and support provided.   PLAN: Continue oxycodone every 4-6 hours as needed for pain. Continue Compazine as needed for nausea/vomiting. Patient will begin aggressive bowel regimen to facilitate bowel movement.  Plans to continue MiraLAX twice daily, Colace, and will take magnesium citrate today.  Patient and husband knows to contact office if no bowel movement by Friday. I will plan to see patient back in  the clinic after Thanksgiving unless needs arise before then.   Patient expressed understanding and was in agreement with this plan. She also understands that She can call the clinic at any time with any questions, concerns, or complaints.     Time Total: 30 min.   Visit consisted of counseling and education dealing with the complex and emotionally intense issues of symptom management and palliative care in the setting of serious and potentially life-threatening illness.Greater than 50%  of this time was spent counseling and coordinating care related to the above assessment and plan.  Signed by: Alda Lea, AGPCNP-BC Palliative Medicine Team

## 2021-08-24 NOTE — Progress Notes (Addendum)
  Outpatient Palliative Care  [phone] ________________________________ Nursing Assessment:  Name: Jessica Johnston        MRN: [id number]  Date of Service: 08/24/2021 DOB: 05/17/1941 Visit Type: Follow up for abdominal pain, new onset of constipation   Support at Visit: Jessica Johnston, husband  Review of Systems: General: Jessica Johnston states she does have weakness due to not being able to eat. Experiencing fatigue and states she naps throughout the day.  Experiencing weight loss-7lbs since last visit.  She states she is sleeping good due to being so tired.  Neuro: Jessica Johnston states she has numbness/tingling in her feet periodically.  Experiences dizziness periodically.  Cardiac: No chest pain Pulmonary: SHOB with activity-mainly when getting up in the morning after laying down for an extended period of time. Also experiences pain with rising.   GI: Jessica Johnston reports no appetite.  Experiencing N/V with no relief from Compazine. She states this causes her to feel more nauseous.  No supplements. Reports no BM in a week-stated she stopped taking Miralax regularly.  Reports having gas.  GU: No urinary issues Integumentary: No skin changes. Psychological: Jessica Johnston reports no feelings of anxiety, depression, or hopelessness.    Medication Changes/Additions: Oxycodone increased to 10mg  q4hours prn per provider at [facility].     Pain Review: Scale of 0-10: 0 at rest, 8 when moving from a sitting to standing position-especially in the morning Location: left, lower abdomen Description: sharp, shooting Frequency: periodic Relief: Oxycodone   Social Review: Living Situation: lives with husband, Jessica Johnston Support: [place] in the last 3 months? No  Assistive Device Use? No  Functional Status: Independent   ACP Form Review: Has documents in [facility]  Family/Patient Concerns: Patient concerned with constipation and lack of appetite/not eating/weight loss.   Provider notified of assessment  and patient/family concerns. Patient instructed to call with any questions or concerns.

## 2021-08-24 NOTE — Patient Instructions (Signed)
Continue taking your pain medication as needed for pain. If pain is well controlled try to extend time frame out from 4-6 hours.   Bowel Regimen:  Miralax twice a day Colace stool softner (over-the-counter) Magnesium Citrate (over-the-counter) take a half of bottle. If no bowel movement in 6-8 hrs 24hrs max then please take the remainder of the bottle if you can tolerate.    If no bowel movement by Thursday please give me a call back. [phone].

## 2021-08-25 ENCOUNTER — Telehealth: Payer: Self-pay

## 2021-08-25 NOTE — Telephone Encounter (Signed)
I called Jessica Johnston to follow up with her constipation. She reported that she was able to have a BM yesterday when she got home. She said she felt that the Oxycodone was causing her to experience many side effects, therefore she is not taking it anymore and turning to alternative medications. I advised her to take the Miralax if she begins to get constipated again. She verbalized understanding. All questions were answered. Instructed to call with any questions/concerns.

## 2021-08-30 DIAGNOSIS — C786 Secondary malignant neoplasm of retroperitoneum and peritoneum: Secondary | ICD-10-CM | POA: Diagnosis not present

## 2021-08-30 DIAGNOSIS — G893 Neoplasm related pain (acute) (chronic): Secondary | ICD-10-CM | POA: Diagnosis not present

## 2021-09-02 DIAGNOSIS — R1902 Left upper quadrant abdominal swelling, mass and lump: Secondary | ICD-10-CM | POA: Diagnosis not present

## 2021-09-02 DIAGNOSIS — C762 Malignant neoplasm of abdomen: Secondary | ICD-10-CM | POA: Diagnosis not present

## 2021-09-02 MED FILL — KEYTRUDA 25 MG/ML INTRAVENOUS SOLUTION: 25 mg/mL | INTRAVENOUS | Qty: 8

## 2021-09-03 ENCOUNTER — Ambulatory Visit: Admit: 2021-09-03 | Discharge: 2021-09-03 | Payer: MEDICARE | Attending: Specialist | Primary: Family Medicine

## 2021-09-03 DIAGNOSIS — I10 Essential (primary) hypertension: Secondary | ICD-10-CM

## 2021-09-03 DIAGNOSIS — R180 Malignant ascites: Secondary | ICD-10-CM | POA: Diagnosis not present

## 2021-09-03 DIAGNOSIS — R634 Abnormal weight loss: Secondary | ICD-10-CM | POA: Diagnosis not present

## 2021-09-03 DIAGNOSIS — E785 Hyperlipidemia, unspecified: Secondary | ICD-10-CM | POA: Diagnosis not present

## 2021-09-03 DIAGNOSIS — R1030 Lower abdominal pain, unspecified: Secondary | ICD-10-CM | POA: Diagnosis not present

## 2021-09-03 DIAGNOSIS — G43909 Migraine, unspecified, not intractable, without status migrainosus: Secondary | ICD-10-CM | POA: Diagnosis not present

## 2021-09-03 DIAGNOSIS — M8588 Other specified disorders of bone density and structure, other site: Secondary | ICD-10-CM | POA: Diagnosis not present

## 2021-09-03 DIAGNOSIS — N183 Chronic kidney disease, stage 3 unspecified: Secondary | ICD-10-CM | POA: Diagnosis not present

## 2021-09-03 DIAGNOSIS — C5702 Malignant neoplasm of left fallopian tube: Secondary | ICD-10-CM | POA: Diagnosis not present

## 2021-09-03 DIAGNOSIS — D61818 Other pancytopenia: Secondary | ICD-10-CM | POA: Diagnosis not present

## 2021-09-03 NOTE — Progress Notes (Signed)
 Dawn Green is a 80 y.o. female    Visit Vitals  BP 96/60 (BP 1 Location: Left upper arm, BP Patient Position: Sitting, BP Cuff Size: Adult)   Pulse 65   Ht 5' 3 (1.6 m)   Wt 129 lb (58.5 kg)   SpO2 99%   BMI 22.85 kg/m       Chief Complaint   Patient presents with    Cholesterol Problem    Hypertension       Chest pain NO  SOB NO  Dizziness YES  Swelling NO  Recent hospital visit [place]  Refills NO  COVID VACCINE STATUS YES  HAD COVID? NO

## 2021-09-03 NOTE — Progress Notes (Signed)
Progress Notes by Vennie Homans, MD at 09/03/21 1540                Author: Vennie Homans, MD  Service: --  Author Type: Physician       Filed: 09/03/21 1622  Encounter Date: 09/03/2021  Status: Signed          Editor: Vennie Homans, MD (Physician)                                       CARDIOLOGY OFFICE NOTE      Jeannette How. Talani Brazee, MD, [address], [place] [address]   Phone [phone]; Fax [phone]   Mobile [phone]   Voice Mail [phone]      Primary care: Esau Grew, DO          ATTENTION:    This medical record was transcribed using an electronic medical records/speech recognition system.  Although proofread, it may and can contain electronic, spelling and other errors.  Corrections may be executed at a later time.  Please feel free to  contact us for any clarifications as needed.                   Dawn Green is a 80 y.o. female with  referred for possible embolic events noted on a head CT scan            Cardiac risk factors: dyslipidemia, diabetes mellitus, sedentary life style, hypertension, post-menopausal   I have personally obtained the history from the patient.        HISTORY OF PRESENTING ILLNESS      [name]  80 y.o. is very pleasant lady who comes in with her husband is a patient of mine.  She is extremely sweet and has a history of both breast cancer and lung cancer.   She is currently being treated for the lung cancer.  She did have a gamma knife performed secondary to lesions on her brain and at this time of the CT there was some suggestion that she had embolic events and were curious whether or not there was a PFO  or possible atrial fibrillation and whether or not anticoagulation was indicated.  Further work-up is indicated.         CT Chest 06/11/2021: Large lingular mass, compatible with neoplasm. Bulky mediastinal and left hilar adenopathy, compatible with metastatic  disease.tage IVB (cT3 cN3 M1c) Non-small cell lung  cancer.Gamma knife with Dr. Oletta Darter on 07/21/2021.  She has a prior history of breast cancer  with a right lumpectomy on 04/2013 with radiation.           ACTIVE PROBLEM LIST          Patient Active Problem List           Diagnosis  Date Noted         ?  Encounter for antineoplastic immunotherapy  07/16/2021     ?  Metastatic non-small cell lung cancer ([place])  07/15/2021     ?  Controlled type 2 diabetes mellitus without complication, without long-term current use of insulin ([place])  07/29/2016     ?  Elevated fasting glucose  07/28/2016     ?  Essential hypertension  01/05/2016     ?  Mixed hyperlipidemia  01/05/2016     ?  Hypovitaminosis D  10/05/2015     ?  Postmenopausal  05/06/2013         ?  Breast cancer, right ([place])  03/18/2013                  PAST MEDICAL HISTORY          Past Medical History:        Diagnosis  Date         ?  Breast CA ([place])  2014          Right - Lumpectomy          ?  Diabetes ([place])       ?  Glaucoma       ?  Hx of mammogram  02/05/2016          Negative per pt. Sees Dr. Laurena Bering          ?  Hyperlipemia       ?  Hypertension       ?  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  Other ill-defined conditions(799.89)  2016          Mild URI x 4 months (allergies)          ?  Radiation therapy complication  6195     ?  Routine Papanicolaou smear  1996          Negative per pt.          ?  Skin cancer            Basal cell on left side of nose and upper lip                  PAST SURGICAL HISTORY          Past Surgical History:         Procedure  Laterality  Date          ?  HX BREAST BIOPSY  Left            x 2 negative biopsies           ?  HX BREAST LUMPECTOMY  Right  2014     ?  HX CATARACT REMOVAL  Bilateral            w/ IOL implants          ?  HX TAH AND BSO    1997          for rapidly growing fibroid ([facility]) - benign.  Dr. Oran Rein.          ?  [place]     ?  IR INSERT TUNL CVC W PORT OVER 5 YEARS    07/23/2021     ?  PR BREAST SURGERY PROCEDURE  UNLISTED  Right  2014          Right breast lumpectomy                 ALLERGIES          Allergies        Allergen  Reactions         ?  Metformin  Nausea Only             Stomach upset  FAMILY HISTORY          Family History         Problem  Relation  Age of Onset          ?  Cancer  Mother  23              Ovarian - Passed away in Jan 06, 1965          ?  Ovarian Cancer  Mother  52     ?  Other  Son  36              Pancreatitis           ?  Cancer  Paternal Aunt  59              Pancreatic      negative for cardiac disease            SOCIAL HISTORY          Social History          Socioeconomic History         ?  Marital status:  MARRIED       Tobacco Use         ?  Smokeless tobacco:  Never        ?  Tobacco comments:             Never used vapor or e-cigs        Substance and Sexual Activity         ?  Alcohol use:  Yes              Alcohol/week:  11.7 standard drinks         Types:  14 Standard drinks or equivalent per week             Comment: 1 gin and tonics per day         ?  Drug use:  No     ?  Sexual activity:  Yes              Partners:  Male              Birth control/protection:  Surgical          Social Determinants of Health          Financial Resource Strain: Unknown        ?  Difficulty of Paying Living Expenses: Patient refused       Food Insecurity: Unknown        ?  Worried About Charity fundraiser in the Last Year: Patient refused        ?  Ran Out of Food in the Last Year: Patient refused                MEDICATIONS          Current Outpatient Medications        Medication  Sig         ?  lidocaine-prilocaine (EMLA) topical cream  Apply  to affected area as needed for Pain (Apply 30-60 min. prior to having your port accessed).     ?  olmesartan (BENICAR) 20 mg tablet  Take 1 Tablet by mouth in the morning.     ?  amitriptyline (ELAVIL) 10 mg tablet  Take 1 Tablet by mouth nightly.         ?  alendronate (FOSAMAX) 35 mg tablet  TAKE 1 TABLET  EVERY 7 DAYS         ?  atorvastatin  (LIPITOR) 40 mg tablet  TAKE 1 TABLET DAILY BEFORE BREAKFAST     ?  brimonidine-timoloL (COMBIGAN) 0.2-0.5 % drop ophthalmic solution  Administer 1 Drop to both eyes every twelve (12) hours.         ?  aspirin 81 mg chewable tablet  Take 81 mg by mouth daily.          No current facility-administered medications for this visit.           I have reviewed the nurses notes, vitals, problem list, allergy list, medical history, family, social history and medications.            REVIEW OF SYMPTOMS      As per HPI   General: Pt denies excessive weight gain or loss. Pt is able to conduct ADL's   HEENT: Denies blurred vision, headaches, hearing loss, epistaxis and difficulty swallowing.   Respiratory: Denies cough, congestion, shortness of breath, DOE, wheezing or stridor.   Cardiovascular: Denies precordial pain, palpitations, edema or PND   Gastrointestinal: Denies poor appetite, indigestion, abdominal pain or blood in stool   Genitourinary: Denies hematuria, dysuria, increased urinary frequency   Musculoskeletal: Denies joint pain or swelling from muscles or joints   Neurologic: Denies tremor, paresthesias, headache, or sensory motor disturbance   Psychiatric: Denies confusion, insomnia, depression   Integumentray: Denies rash, itching or ulcers.   Hematologic: Denies easy bruising, bleeding         PHYSICAL EXAMINATION         There were no vitals filed for this visit.   General: Well developed, in no acute distress.   HEENT: No jaundice, oral mucosa moist, no oral ulcers   Neck: Supple, no stiffness, no lymphadenopathy, supple   Heart:  Normal S1/S2 negative S3 or S4. Regular, no murmur, gallop or rub, no jugular venous distention   Respiratory: Clear bilaterally x 4, no wheezing or rales   Extremities:  No edema, normal cap refill, no cyanosis.   Musculoskeletal: No clubbing, no deformities   Neuro: A&Ox3, speech clear, gait stable, cooperative, no focal neurologic deficits   Skin: Skin color is normal. No rashes or  lesions. Non diaphoretic, moist.   Vascular: 2+ pulses symmetric in all extremities   Abdomen:   Soft, non-tender, bowel sounds are active.          EKG: Date: (09/03/21): Sinus rhythm with incomplete right bundle branch block         DIAGNOSTIC DATA        No specialty comments available.               LABORATORY DATA             Lab Results         Component  Value  Date/Time            WBC  10.7  08/17/2021 01:09 PM       HGB  11.0 (L)  08/17/2021 01:09 PM       HCT  35.3  08/17/2021 01:09 PM       PLATELET  468 (H)  08/17/2021 01:09 PM            MCV  84.4  08/17/2021 01:09 PM           Lab Results         Component  Value  Date/Time  Sodium  135 (L)  08/17/2021 01:09 PM       Potassium  3.6  08/17/2021 01:09 PM       Chloride  100  08/17/2021 01:09 PM       CO2  30  08/17/2021 01:09 PM       Anion gap  5  08/17/2021 01:09 PM       Glucose  129 (H)  08/17/2021 01:09 PM       BUN  40 (H)  08/17/2021 01:09 PM       Creatinine  1.03 (H)  08/17/2021 01:09 PM       BUN/Creatinine ratio  39 (H)  08/17/2021 01:09 PM       GFR est AA  >60  06/11/2021 10:14 AM       GFR est non-AA  >60  06/11/2021 10:14 AM       Calcium  10.0  08/17/2021 01:09 PM       Bilirubin, total  0.4  08/17/2021 01:09 PM       Alk. phosphatase  89  08/17/2021 01:09 PM       Protein, total  7.7  08/17/2021 01:09 PM       Albumin  3.0 (L)  08/17/2021 01:09 PM       Globulin  4.7 (H)  08/17/2021 01:09 PM       A-G Ratio  0.6 (L)  08/17/2021 01:09 PM            ALT (SGPT)  32  08/17/2021 01:09 PM                        ICD-10-CM  ICD-9-CM          1.  Essential hypertension   I10  401.9     2.  Mixed hyperlipidemia   E78.2  272.2          3.  Controlled type 2 diabetes mellitus without complication, without long-term current use of insulin (HCC)   E11.9  250.00              ASSESSMENT/RECOMMENDATIONS:.         1.  Dr. Darlys Gales at [facility] felt that she potentially had negative all embolic events on her CT scan when he did her gamma knife and was  concerned that maybe she had either A. fib or a PFO   -I will do echocardiogram with a bubble study   -We will plan on event monitor on her for the time being for 2 weeks sure she has no A. Fib   -Continue aspirin for the time being but will discontinue if she has a negative work-up   2.  Dyslipidemia   -Last LDL 62 on 04/21/2021 on atorvastatin 40 mg a day   3.  Hypertension   -Blood pressure is slightly low at 96/60 and she is currently taking Benicar 20 mg a day   -I asked that she reduce her Benicar to 10 mg and follow her blood pressures let me know what they are   4.  T2DM   -Hemoglobin A1c was 6.1   Follow-up with me in 6 to 8 weeks      No orders of the defined types were placed in this encounter.         We discussed the expected course, resolution and complications of the diagnosis(es) in detail.  Medication risks, benefits, costs, interactions, and alternatives were discussed as indicated.  I  advised him to contact the office if his condition worsens,  changes or fails to improve as anticipated. He expressed understanding with the diagnosis(es) and plan                Follow-up and Dispositions    ??       Return in about 7 weeks (around 10/22/2021).                    I have discussed the diagnosis with  Dawn Green and the intended plan as seen in  the above orders.  Questions were answered concerning future plans.  I have discussed medication side effects and warnings with the patient as well.      Thank you,  Esau Grew, DO for involving me in the care of  Dawn Green. Please do not hesitate to contact me for further questions/concerns.             Esias Mory A.Avilene Marrin,  MD, [facility]       [address] [place], [place]      [address], [place]       [phone] / [phone] Fax

## 2021-09-06 DIAGNOSIS — E785 Hyperlipidemia, unspecified: Secondary | ICD-10-CM | POA: Diagnosis not present

## 2021-09-06 DIAGNOSIS — R180 Malignant ascites: Secondary | ICD-10-CM | POA: Diagnosis not present

## 2021-09-06 DIAGNOSIS — C5702 Malignant neoplasm of left fallopian tube: Secondary | ICD-10-CM | POA: Diagnosis not present

## 2021-09-06 DIAGNOSIS — R1030 Lower abdominal pain, unspecified: Secondary | ICD-10-CM | POA: Diagnosis not present

## 2021-09-06 DIAGNOSIS — R634 Abnormal weight loss: Secondary | ICD-10-CM | POA: Diagnosis not present

## 2021-09-06 DIAGNOSIS — D61818 Other pancytopenia: Secondary | ICD-10-CM | POA: Diagnosis not present

## 2021-09-07 ENCOUNTER — Institutional Professional Consult (permissible substitution): Payer: MEDICARE | Primary: Family Medicine

## 2021-09-07 ENCOUNTER — Ambulatory Visit
Admit: 2021-09-07 | Discharge: 2021-09-07 | Payer: MEDICARE | Attending: Hematology & Oncology | Primary: Family Medicine

## 2021-09-07 ENCOUNTER — Inpatient Hospital Stay: Admit: 2021-09-07 | Payer: MEDICARE | Primary: Family Medicine

## 2021-09-07 DIAGNOSIS — C3490 Malignant neoplasm of unspecified part of unspecified bronchus or lung: Secondary | ICD-10-CM

## 2021-09-07 DIAGNOSIS — Z5112 Encounter for antineoplastic immunotherapy: Secondary | ICD-10-CM

## 2021-09-07 LAB — TSH 3RD GENERATION
TSH: 1.78 u[IU]/mL (ref 0.36–3.74)
TSH: 1.78 u[IU]/mL (ref 0.36–3.74)

## 2021-09-07 LAB — CBC WITH AUTO DIFFERENTIAL
Basophils %: 1 % (ref 0–1)
Basophils Absolute: 0.1 10*3/uL (ref 0.0–0.1)
Eosinophils %: 6 % (ref 0–7)
Eosinophils Absolute: 0.6 10*3/uL — ABNORMAL HIGH (ref 0.0–0.4)
Granulocyte Absolute Count: 0.1 10*3/uL — ABNORMAL HIGH (ref 0.00–0.04)
Hematocrit: 35.3 % (ref 35.0–47.0)
Hemoglobin: 11.1 g/dL — ABNORMAL LOW (ref 11.5–16.0)
Immature Granulocytes: 1 % — ABNORMAL HIGH (ref 0.0–0.5)
Lymphocytes %: 17 % (ref 12–49)
Lymphocytes Absolute: 1.7 10*3/uL (ref 0.8–3.5)
MCH: 26.4 PG (ref 26.0–34.0)
MCHC: 31.4 g/dL (ref 30.0–36.5)
MCV: 83.8 FL (ref 80.0–99.0)
MPV: 9.9 FL (ref 8.9–12.9)
Monocytes %: 9 % (ref 5–13)
Monocytes Absolute: 0.9 10*3/uL (ref 0.0–1.0)
NRBC Absolute: 0 10*3/uL (ref 0.00–0.01)
Neutrophils %: 66 % (ref 32–75)
Neutrophils Absolute: 6.5 10*3/uL (ref 1.8–8.0)
Nucleated RBCs: 0 PER 100 WBC
Platelets: 392 10*3/uL (ref 150–400)
RBC: 4.21 M/uL (ref 3.80–5.20)
RDW: 15.5 % — ABNORMAL HIGH (ref 11.5–14.5)
WBC: 9.9 10*3/uL (ref 3.6–11.0)

## 2021-09-07 LAB — COMPREHENSIVE METABOLIC PANEL
ALT: 27 U/L (ref 12–78)
AST: 14 U/L — ABNORMAL LOW (ref 15–37)
Albumin/Globulin Ratio: 0.7 — ABNORMAL LOW (ref 1.1–2.2)
Albumin: 3 g/dL — ABNORMAL LOW (ref 3.5–5.0)
Alkaline Phosphatase: 70 U/L (ref 45–117)
Anion Gap: 8 mmol/L (ref 5–15)
BUN: 34 MG/DL — ABNORMAL HIGH (ref 6–20)
Bun/Cre Ratio: 33 — ABNORMAL HIGH (ref 12–20)
CO2: 28 mmol/L (ref 21–32)
Calcium: 9.4 MG/DL (ref 8.5–10.1)
Chloride: 102 mmol/L (ref 97–108)
Creatinine: 1.03 MG/DL — ABNORMAL HIGH (ref 0.55–1.02)
ESTIMATED GLOMERULAR FILTRATION RATE: 55 mL/min/{1.73_m2} — ABNORMAL LOW (ref >60)
Globulin: 4.5 g/dL — ABNORMAL HIGH (ref 2.0–4.0)
Glucose: 131 mg/dL — ABNORMAL HIGH (ref 65–100)
Potassium: 3.6 mmol/L (ref 3.5–5.1)
Sodium: 138 mmol/L (ref 136–145)
Total Bilirubin: 0.4 MG/DL (ref 0.2–1.0)
Total Protein: 7.5 g/dL (ref 6.4–8.2)

## 2021-09-07 LAB — CBC WITH AUTOMATED DIFF
ABS. BASOPHILS: 0.1 10*3/uL (ref 0.0–0.1)
ABS. EOSINOPHILS: 0.6 10*3/uL — ABNORMAL HIGH (ref 0.0–0.4)
ABS. IMM. GRANS.: 0.1 10*3/uL — ABNORMAL HIGH (ref 0.00–0.04)
ABS. LYMPHOCYTES: 1.7 10*3/uL (ref 0.8–3.5)
ABS. MONOCYTES: 0.9 10*3/uL (ref 0.0–1.0)
ABS. NEUTROPHILS: 6.5 10*3/uL (ref 1.8–8.0)
ABSOLUTE NRBC: 0 10*3/uL (ref 0.00–0.01)
BASOPHILS: 1 % (ref 0–1)
EOSINOPHILS: 6 % (ref 0–7)
HCT: 35.3 % (ref 35.0–47.0)
HGB: 11.1 g/dL — ABNORMAL LOW (ref 11.5–16.0)
IMMATURE GRANULOCYTES: 1 % — ABNORMAL HIGH (ref 0.0–0.5)
LYMPHOCYTES: 17 % (ref 12–49)
MCH: 26.4 PG (ref 26.0–34.0)
MCHC: 31.4 g/dL (ref 30.0–36.5)
MCV: 83.8 FL (ref 80.0–99.0)
MONOCYTES: 9 % (ref 5–13)
MPV: 9.9 FL (ref 8.9–12.9)
NEUTROPHILS: 66 % (ref 32–75)
NRBC: 0 PER 100 WBC
PLATELET: 392 10*3/uL (ref 150–400)
RBC: 4.21 M/uL (ref 3.80–5.20)
RDW: 15.5 % — ABNORMAL HIGH (ref 11.5–14.5)
WBC: 9.9 10*3/uL (ref 3.6–11.0)

## 2021-09-07 LAB — METABOLIC PANEL, COMPREHENSIVE
A-G Ratio: 0.7 — ABNORMAL LOW (ref 1.1–2.2)
ALT (SGPT): 27 U/L (ref 12–78)
AST (SGOT): 14 U/L — ABNORMAL LOW (ref 15–37)
Albumin: 3 g/dL — ABNORMAL LOW (ref 3.5–5.0)
Alk. phosphatase: 70 U/L (ref 45–117)
Anion gap: 8 mmol/L (ref 5–15)
BUN/Creatinine ratio: 33 — ABNORMAL HIGH (ref 12–20)
BUN: 34 MG/DL — ABNORMAL HIGH (ref 6–20)
Bilirubin, total: 0.4 MG/DL (ref 0.2–1.0)
CO2: 28 mmol/L (ref 21–32)
Calcium: 9.4 MG/DL (ref 8.5–10.1)
Chloride: 102 mmol/L (ref 97–108)
Creatinine: 1.03 MG/DL — ABNORMAL HIGH (ref 0.55–1.02)
Globulin: 4.5 g/dL — ABNORMAL HIGH (ref 2.0–4.0)
Glucose: 131 mg/dL — ABNORMAL HIGH (ref 65–100)
Potassium: 3.6 mmol/L (ref 3.5–5.1)
Protein, total: 7.5 g/dL (ref 6.4–8.2)
Sodium: 138 mmol/L (ref 136–145)
eGFR: 55 mL/min/{1.73_m2} — ABNORMAL LOW (ref >60)

## 2021-09-07 MED ORDER — SODIUM CHLORIDE 0.9 % IV
INTRAVENOUS | Status: AC | PRN
Start: 2021-09-07 — End: 2021-09-08

## 2021-09-07 MED ORDER — WATER FOR INJECTION, STERILE INJECTION
2 mg | INTRAMUSCULAR | Status: DC | PRN
Start: 2021-09-07 — End: 2021-09-08

## 2021-09-07 MED ORDER — PEMBROLIZUMAB 100 MG/4 ML (25 MG/ML) INTRAVENOUS SOLUTION
25 mg/mL | Freq: Once | INTRAVENOUS | Status: AC
Start: 2021-09-07 — End: 2021-09-07
  Administered 2021-09-07: 21:00:00 via INTRAVENOUS

## 2021-09-07 MED ORDER — SODIUM CHLORIDE 0.9% BOLUS IV
0.9 % | Freq: Once | INTRAVENOUS | Status: AC
Start: 2021-09-07 — End: 2021-09-07
  Administered 2021-09-07: 20:00:00 via INTRAVENOUS

## 2021-09-07 MED ORDER — SODIUM CHLORIDE 0.9 % IV
INTRAVENOUS | Status: DC | PRN
Start: 2021-09-07 — End: 2021-09-08

## 2021-09-07 MED ORDER — SODIUM CHLORIDE 0.9 % IJ SYRG
INTRAMUSCULAR | Status: DC | PRN
Start: 2021-09-07 — End: 2021-09-08

## 2021-09-07 MED ORDER — SODIUM CHLORIDE 0.9 % INJECTION
INTRAMUSCULAR | Status: DC | PRN
Start: 2021-09-07 — End: 2021-09-08
  Administered 2021-09-07: 21:00:00 via INTRAVENOUS

## 2021-09-07 MED ORDER — HEPARIN, PORCINE (PF) 100 UNIT/ML IV SYRINGE
100 unit/mL | INTRAVENOUS | Status: DC | PRN
Start: 2021-09-07 — End: 2021-09-08
  Administered 2021-09-07: 21:00:00

## 2021-09-07 MED FILL — SODIUM CHLORIDE 0.9 % IV: INTRAVENOUS | Qty: 1000

## 2021-09-07 MED FILL — SODIUM CHLORIDE 0.9 % IV: INTRAVENOUS | Qty: 500

## 2021-09-07 MED FILL — BD POSIFLUSH NORMAL SALINE 0.9 % INJECTION SYRINGE: INTRAMUSCULAR | Qty: 40

## 2021-09-07 NOTE — Progress Notes (Signed)
 Outpatient Infusion Center - Chemotherapy Progress Note    1300 Pt admit to [facility] for C 3 Keytruda/Hydration ambulatory in stable condition. Assessment completed. No new concerns voiced. PAC with positive blood return.  Chemotherapy Flowsheet 09/07/2021   Cycle C3D1   Date 09/07/2021   Drug / Regimen sharion   Pre Hydration -   Notes hydration       Visit Vitals  BP (!) 108/53   Pulse 68   Temp 97 F (36.1 C)   Resp 18   Ht 5' 3 (1.6 m)   Wt 58.8 kg (129 lb 9.6 oz)   SpO2 95%   Breastfeeding No   BMI 22.96 kg/m       Pt assessed and accessed by G[place]. Labs obtained and patient proceeded to scheduled MD appointment. Pt returned- hydration added.    Two nurses verified prior to administering:    Drug name    Drug dose    Infusion volume or drug volume when prepared in a syringe    Rate of administration    Route of administration    Expiration dates and/or times    Appearance and physical integrity of the drugs    Rate set on infusion pump, when used    Sequencing of drug administration    Medications:  Medications Administered       0.9% sodium chloride injection 5-40 mL       Admin Date  09/07/2021 Action  Given Dose  10 mL Route  IntraVENous Administered By  Reuel Railing, RN              heparin (porcine) pf 500 Units       Admin Date  09/07/2021 Action  Given Dose  500 Units Route  InterCATHeter Administered By  Reuel Railing, RN              pembrolizumab [facility]) 200 mg in 0.9% sodium chloride 100 mL, overfill volume 10 mL IVPB       Admin Date  09/07/2021 Action  New Bag Dose  200 mg Rate  236 mL/hr Route  IntraVENous Administered By  Reuel Railing, RN              sodium chloride 0.9 % bolus infusion 500 mL       Admin Date  09/07/2021 Action  New Bag Dose  500 mL Rate  1,000 mL/hr Route  IntraVENous Administered By  Perley Tawni BRAVO, RN                     [phone]. Pt tolerated treatment well. PAC maintained positive blood return throughout treatment, flushed with positive blood return at conclusion and  throughout treatment. D/c home ambulatory in no distress. Pt aware of next appointment scheduled for 09/28/21.       AVS declined  Recent Results (from the past 12 hour(s))   TSH 3RD GENERATION    Collection Time: 09/07/21  1:22 PM   Result Value Ref Range    TSH 1.78 0.36 - 3.74 uIU/mL   CBC WITH AUTOMATED DIFF    Collection Time: 09/07/21  1:22 PM   Result Value Ref Range    WBC 9.9 3.6 - 11.0 K/uL    RBC 4.21 3.80 - 5.20 M/uL    HGB 11.1 (L) 11.5 - 16.0 g/dL    HCT 64.6 64.9 - 52.9 %    MCV 83.8 80.0 - 99.0 FL    MCH 26.4 26.0 - 34.0 PG  MCHC 31.4 30.0 - 36.5 g/dL    RDW 84.4 (H) 88.4 - 14.5 %    PLATELET 392 150 - 400 K/uL    MPV 9.9 8.9 - 12.9 FL    NRBC 0.0 0 PER 100 WBC    ABSOLUTE NRBC 0.00 0.00 - 0.01 K/uL    NEUTROPHILS 66 32 - 75 %    LYMPHOCYTES 17 12 - 49 %    MONOCYTES 9 5 - 13 %    EOSINOPHILS 6 0 - 7 %    BASOPHILS 1 0 - 1 %    IMMATURE GRANULOCYTES 1 (H) 0.0 - 0.5 %    ABS. NEUTROPHILS 6.5 1.8 - 8.0 K/UL    ABS. LYMPHOCYTES 1.7 0.8 - 3.5 K/UL    ABS. MONOCYTES 0.9 0.0 - 1.0 K/UL    ABS. EOSINOPHILS 0.6 (H) 0.0 - 0.4 K/UL    ABS. BASOPHILS 0.1 0.0 - 0.1 K/UL    ABS. IMM. GRANS. 0.1 (H) 0.00 - 0.04 K/UL    DF AUTOMATED     METABOLIC PANEL, COMPREHENSIVE    Collection Time: 09/07/21  1:22 PM   Result Value Ref Range    Sodium 138 136 - 145 mmol/L    Potassium 3.6 3.5 - 5.1 mmol/L    Chloride 102 97 - 108 mmol/L    CO2 28 21 - 32 mmol/L    Anion gap 8 5 - 15 mmol/L    Glucose 131 (H) 65 - 100 mg/dL    BUN 34 (H) 6 - 20 MG/DL    Creatinine 8.96 (H) 0.55 - 1.02 MG/DL    BUN/Creatinine ratio 33 (H) 12 - 20      eGFR 55 (L) >60 ml/min/1.63m2    Calcium 9.4 8.5 - 10.1 MG/DL    Bilirubin, total 0.4 0.2 - 1.0 MG/DL    ALT (SGPT) 27 12 - 78 U/L    AST (SGOT) 14 (L) 15 - 37 U/L    Alk. phosphatase 70 45 - 117 U/L    Protein, total 7.5 6.4 - 8.2 g/dL    Albumin 3.0 (L) 3.5 - 5.0 g/dL    Globulin 4.5 (H) 2.0 - 4.0 g/dL    A-G Ratio 0.7 (L) 1.1 - 2.2

## 2021-09-07 NOTE — Progress Notes (Signed)
 MASSA PE is a 80 y.o. female follow up for lung cancer.    1. Have you been to the ER, urgent care clinic since your last visit?  Hospitalized since your last visit?no     2. Have you seen or consulted any other health care providers outside of the [facility] since your last visit?  Include any pap smears or colon screening. No    Vitals 09/07/2021   Blood Pressure    Pulse 77   Temp 97   Resp 18   Height 5' 3   Weight 129 lb 9.6 oz   SpO2 95   BSA 1.62 m2   BMI 22.96 kg/m2

## 2021-09-07 NOTE — Progress Notes (Signed)
Mailed 14 Day Extended Holter per Dr Mort Sawyers dx AF.  Chargeable

## 2021-09-07 NOTE — Telephone Encounter (Signed)
Telephone Encounter by Pamella Pert at 09/07/21 1607                Author: Pamella Pert  Service: --  Author Type: Registered Nurse       Filed: 09/07/21 1607  Encounter Date: 09/07/2021  Status: Signed          Editor: Pamella Pert (Registered Nurse)               Enrolled with [employer] - Ordered and being shipped to patient's home address on file.  ETA within 5-7 business days.       Appointment (14 day loop)   (Newest Message First)     Lucille Passy, LPN routed conversation to You; Luana Shu, Jami 4 hours ago (11:12 AM)          Lucille Passy, LPN 4 hours ago (86:57 AM)        ET   Dr Mort Sawyers pt needs 14 day loop to look for afib               Unsigned       Note

## 2021-09-07 NOTE — Progress Notes (Signed)
Progress  Notes by RaddinReuel Derby, MD at 09/07/21 1315                Author: Heloise Purpura, MD  Service: --  Author Type: Physician       Filed: 09/07/21 1625  Encounter Date: 09/07/2021  Status: Signed          Editor: Jodeci Roarty, Reuel Derby, MD (Physician)                       [place] [facility]   [address], [address] [place] [address]   W: [phone]  F: [phone]           Reason for Visit:     Dawn Green is a 80 y.o. female who is seen in follow up for evaluation of lung cancer.        Hematology/Oncology Treatment History:     ??  CT Chest 06/11/2021: Large lingular mass, compatible with neoplasm. Bulky mediastinal  and left hilar adenopathy, compatible with metastatic disease.  Small left pleural effusion, likely malignant.   ??  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  CT A/P 07/06/2021: Incompletely visualized 6.4 cm x 4.6 cm lingular soft tissue mass. Trace left pleural fluid.   ??  MRI Brain 07/06/2021: Nonenhancing/minimally enhancing ring lesion of the (hemorrhagic/melanotic) left parietal mass lesion measures 11 x 15 x 16 mm in size. Ring-enhancing lesion with minimal associated  susceptibility. There is no significant midline shift or mass effect. There is no significant associated vasogenic edema. Despite its atypical appearance most likely represents a metastasis, not fully delineated. Mild cerebral atrophy and chronic microvascular  ischemic change. No other evidence of intracranial metastasis is definitively demonstrated.    ??  PET/CT 07/09/2021: Large lingular mass demonstrates increased tracer activity and is compatible with the known neoplasm. Hypermetabolic bilateral  supraclavicular, mediastinal, and left hilar lymphadenopathy. Hypermetabolic bone lesions concerning for metastatic disease.   ??  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  Gamma knife with Dr. Oletta Darter on 07/21/2021   ??  Initiated palliative  systemic therapy with Pembrolizumab on 07/27/2021        History of Present Illness:     Presents for follow up on therapy. She will celebrate her birthday tomorrow.       Having itching to torso, no rash. They have tried Cortisone cream. One dose of Zyrtec and takes Benadryl at night.       She is working on getting more fluids in during the day. Seen by cardiology and has had BP meds cut in half. Had a fall since last visit in her home. She was walking out onto the deck, stepped down about 4 inches and lost her balance. Hit her shoulder  and elbow. Fall on 08/27/2021.      She was previously seen by Dr. Laurena Bering for breast cancer.  She had a right lumpectomy 04/2013, radiation 06/2023, and anastrozole from 07/2013 to 07/2018.  She was discharged from his follow up 07/2018.      She is accompanied by her husband today.  She lives with her husband and son in [place].  She previously smoked 1ppd, quit 25 years before diagnosis.      Review of systems was obtained and pertinent findings reviewed above. Past medical history, social history, family history, medications, and allergies are located in the  electronic medical record.        Physical Exam:     Visit Vitals      BP  (!) 97/42 (BP 1 Location: Left upper arm, BP Patient Position: Sitting, BP Cuff Size: Large adult)     Pulse  77     Temp  97 ??F (36.1 ??C)     Resp  18     Ht  5' 3"  (1.6 m)     Wt  129 lb (58.5 kg)     SpO2  95%        BMI  22.85 kg/m??        ECOG PS: 1   General: no distress   Eyes: anicteric sclerae   HENT: oropharynx clear   Neck: supple   Lymphatic: no cervical, supraclavicular, or inguinal adenopathy   Respiratory: normal respiratory effort   CV: no peripheral edema   GI: soft, nontender, nondistended, no masses   Skin: no rashes, no ecchymoses, no petechiae   Unstable gait        Results:          Lab Results         Component  Value  Date/Time            WBC  9.9  09/07/2021 01:22 PM       HGB  11.1 (L)  09/07/2021 01:22 PM       HCT  35.3   09/07/2021 01:22 PM       PLATELET  392  09/07/2021 01:22 PM       MCV  83.8  09/07/2021 01:22 PM            ABS. NEUTROPHILS  6.5  09/07/2021 01:22 PM          Lab Results         Component  Value  Date/Time            Sodium  138  09/07/2021 01:22 PM       Potassium  3.6  09/07/2021 01:22 PM       Chloride  102  09/07/2021 01:22 PM       CO2  28  09/07/2021 01:22 PM       Glucose  131 (H)  09/07/2021 01:22 PM       BUN  34 (H)  09/07/2021 01:22 PM       Creatinine  1.03 (H)  09/07/2021 01:22 PM       GFR est AA  >60  06/11/2021 10:14 AM       GFR est non-AA  >60  06/11/2021 10:14 AM       Calcium  9.4  09/07/2021 01:22 PM       Glucose (POC)  137 (H)  07/23/2021 12:58 PM            Creatinine (POC)  0.8  03/26/2013 01:52 PM          Lab Results         Component  Value  Date/Time            Bilirubin, total  0.4  09/07/2021 01:22 PM       ALT (SGPT)  27  09/07/2021 01:22 PM       Alk. phosphatase  70  09/07/2021 01:22 PM       Protein, total  7.5  09/07/2021 01:22 PM       Albumin  3.0 (L)  09/07/2021 01:22 PM  Globulin  4.5 (H)  09/07/2021 01:22 PM           Test results above have been reviewed.        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  Imaging notes extensive disease within her left lung, bilateral supraclavicular and mediastinal nodes, bones, and brain.  Her cancer is  not curable and management is with palliative intent.      Due to high PDL1, she initiated therapy with single agent pembrolizumab.        Tolerating therapy with mild itching. Will proceed with next cycle today as ordered. CT imaging after C4-patient will plan to have done on 12/21 on same day as ECHO      She has elected against enrollment in the DIRECT study.        FoundationOne was unable to be run on sample.        2) Brain metastasis   Isolated metastasis on MRI.  She was seen by Dr. Oletta Darter (neurosurgery) and  underwent gamma knife on 10/5. MRI done at time of procedure with 2 new lesions, uncertain if cancer lesions vs infarcts.      Repeat MRI on 10/21/2020.       3) Cough   Improving. Taking cough syrup PRN.      4) H/o breast cancer   S/p lumpectomy, radiation, and endocrine therapy.      5) Weight loss   Dietician supportive phone call on 07/16/2021. Continue 2 boosts/daily. Encouraged increasing fluid/caloric intake.  Monitor on therapy-stable.       6) Emotional Well Being   No psychosocial concerns identified today. Patient has adequate support.       7) Itching   Start Zyrtec daily. Benadryl nightly      8) Hypotension   Seen by cardiology, had change in BP medications (reduced). Will give 554m NS bolus today.         Plan:        ??  Proceed today with C3 Pembrolizumab (2094m given every 3 weeks   ??  Labs: CBC, CMP prior to each cycle, TSH every 6 weeks   ??  NS bolus today-50020m ??  Follow up every 3 weeks      I personally saw and evaluated the patient and performed the key components of medical decision making.  The history, physical exam, and documentation were performed by MegLucrezia EuropeP.  I reviewed and verified the above documentation and modified it  as needed. She is tolerating systemic therapy with manageable toxicity, so we will proceed with treatment.            Signed By:  RyaHeloise PurpuraD

## 2021-09-07 NOTE — Progress Notes (Signed)
Dr Mort Sawyers pt needs 14 day loop to look for afib

## 2021-09-13 DIAGNOSIS — D61818 Other pancytopenia: Secondary | ICD-10-CM | POA: Diagnosis not present

## 2021-09-13 DIAGNOSIS — R180 Malignant ascites: Secondary | ICD-10-CM | POA: Diagnosis not present

## 2021-09-13 DIAGNOSIS — R634 Abnormal weight loss: Secondary | ICD-10-CM | POA: Diagnosis not present

## 2021-09-13 DIAGNOSIS — C5702 Malignant neoplasm of left fallopian tube: Secondary | ICD-10-CM | POA: Diagnosis not present

## 2021-09-13 DIAGNOSIS — E785 Hyperlipidemia, unspecified: Secondary | ICD-10-CM | POA: Diagnosis not present

## 2021-09-13 DIAGNOSIS — R1030 Lower abdominal pain, unspecified: Secondary | ICD-10-CM | POA: Diagnosis not present

## 2021-09-15 DIAGNOSIS — R634 Abnormal weight loss: Secondary | ICD-10-CM | POA: Diagnosis not present

## 2021-09-15 DIAGNOSIS — R180 Malignant ascites: Secondary | ICD-10-CM | POA: Diagnosis not present

## 2021-09-15 DIAGNOSIS — R1030 Lower abdominal pain, unspecified: Secondary | ICD-10-CM | POA: Diagnosis not present

## 2021-09-15 DIAGNOSIS — E785 Hyperlipidemia, unspecified: Secondary | ICD-10-CM | POA: Diagnosis not present

## 2021-09-15 DIAGNOSIS — D61818 Other pancytopenia: Secondary | ICD-10-CM | POA: Diagnosis not present

## 2021-09-15 DIAGNOSIS — C5702 Malignant neoplasm of left fallopian tube: Secondary | ICD-10-CM | POA: Diagnosis not present

## 2021-09-16 DIAGNOSIS — C5702 Malignant neoplasm of left fallopian tube: Secondary | ICD-10-CM | POA: Diagnosis not present

## 2021-09-16 DIAGNOSIS — M8588 Other specified disorders of bone density and structure, other site: Secondary | ICD-10-CM | POA: Diagnosis not present

## 2021-09-16 DIAGNOSIS — R180 Malignant ascites: Secondary | ICD-10-CM | POA: Diagnosis not present

## 2021-09-16 DIAGNOSIS — R634 Abnormal weight loss: Secondary | ICD-10-CM | POA: Diagnosis not present

## 2021-09-16 DIAGNOSIS — G43909 Migraine, unspecified, not intractable, without status migrainosus: Secondary | ICD-10-CM | POA: Diagnosis not present

## 2021-09-16 DIAGNOSIS — E785 Hyperlipidemia, unspecified: Secondary | ICD-10-CM | POA: Diagnosis not present

## 2021-09-16 DIAGNOSIS — N183 Chronic kidney disease, stage 3 unspecified: Secondary | ICD-10-CM | POA: Diagnosis not present

## 2021-09-16 DIAGNOSIS — R1030 Lower abdominal pain, unspecified: Secondary | ICD-10-CM | POA: Diagnosis not present

## 2021-09-16 DIAGNOSIS — D61818 Other pancytopenia: Secondary | ICD-10-CM | POA: Diagnosis not present

## 2021-09-20 DIAGNOSIS — C5702 Malignant neoplasm of left fallopian tube: Secondary | ICD-10-CM | POA: Diagnosis not present

## 2021-09-20 DIAGNOSIS — R1030 Lower abdominal pain, unspecified: Secondary | ICD-10-CM | POA: Diagnosis not present

## 2021-09-20 DIAGNOSIS — E785 Hyperlipidemia, unspecified: Secondary | ICD-10-CM | POA: Diagnosis not present

## 2021-09-20 DIAGNOSIS — R634 Abnormal weight loss: Secondary | ICD-10-CM | POA: Diagnosis not present

## 2021-09-20 DIAGNOSIS — R180 Malignant ascites: Secondary | ICD-10-CM | POA: Diagnosis not present

## 2021-09-20 DIAGNOSIS — D61818 Other pancytopenia: Secondary | ICD-10-CM | POA: Diagnosis not present

## 2021-09-22 DIAGNOSIS — R634 Abnormal weight loss: Secondary | ICD-10-CM | POA: Diagnosis not present

## 2021-09-22 DIAGNOSIS — E785 Hyperlipidemia, unspecified: Secondary | ICD-10-CM | POA: Diagnosis not present

## 2021-09-22 DIAGNOSIS — C5702 Malignant neoplasm of left fallopian tube: Secondary | ICD-10-CM | POA: Diagnosis not present

## 2021-09-22 DIAGNOSIS — R1030 Lower abdominal pain, unspecified: Secondary | ICD-10-CM | POA: Diagnosis not present

## 2021-09-22 DIAGNOSIS — D61818 Other pancytopenia: Secondary | ICD-10-CM | POA: Diagnosis not present

## 2021-09-22 DIAGNOSIS — R180 Malignant ascites: Secondary | ICD-10-CM | POA: Diagnosis not present

## 2021-09-23 DIAGNOSIS — R180 Malignant ascites: Secondary | ICD-10-CM | POA: Diagnosis not present

## 2021-09-23 DIAGNOSIS — C5702 Malignant neoplasm of left fallopian tube: Secondary | ICD-10-CM | POA: Diagnosis not present

## 2021-09-23 DIAGNOSIS — E785 Hyperlipidemia, unspecified: Secondary | ICD-10-CM | POA: Diagnosis not present

## 2021-09-23 DIAGNOSIS — D61818 Other pancytopenia: Secondary | ICD-10-CM | POA: Diagnosis not present

## 2021-09-23 DIAGNOSIS — R1030 Lower abdominal pain, unspecified: Secondary | ICD-10-CM | POA: Diagnosis not present

## 2021-09-23 DIAGNOSIS — R634 Abnormal weight loss: Secondary | ICD-10-CM | POA: Diagnosis not present

## 2021-09-23 MED FILL — KEYTRUDA 25 MG/ML INTRAVENOUS SOLUTION: 25 mg/mL | INTRAVENOUS | Qty: 8

## 2021-09-24 DIAGNOSIS — C5702 Malignant neoplasm of left fallopian tube: Secondary | ICD-10-CM | POA: Diagnosis not present

## 2021-09-24 DIAGNOSIS — R634 Abnormal weight loss: Secondary | ICD-10-CM | POA: Diagnosis not present

## 2021-09-24 DIAGNOSIS — E785 Hyperlipidemia, unspecified: Secondary | ICD-10-CM | POA: Diagnosis not present

## 2021-09-24 DIAGNOSIS — D61818 Other pancytopenia: Secondary | ICD-10-CM | POA: Diagnosis not present

## 2021-09-24 DIAGNOSIS — R1030 Lower abdominal pain, unspecified: Secondary | ICD-10-CM | POA: Diagnosis not present

## 2021-09-24 DIAGNOSIS — R180 Malignant ascites: Secondary | ICD-10-CM | POA: Diagnosis not present

## 2021-09-25 DIAGNOSIS — R180 Malignant ascites: Secondary | ICD-10-CM | POA: Diagnosis not present

## 2021-09-25 DIAGNOSIS — R1030 Lower abdominal pain, unspecified: Secondary | ICD-10-CM | POA: Diagnosis not present

## 2021-09-25 DIAGNOSIS — D61818 Other pancytopenia: Secondary | ICD-10-CM | POA: Diagnosis not present

## 2021-09-25 DIAGNOSIS — R634 Abnormal weight loss: Secondary | ICD-10-CM | POA: Diagnosis not present

## 2021-09-25 DIAGNOSIS — E785 Hyperlipidemia, unspecified: Secondary | ICD-10-CM | POA: Diagnosis not present

## 2021-09-25 DIAGNOSIS — C5702 Malignant neoplasm of left fallopian tube: Secondary | ICD-10-CM | POA: Diagnosis not present

## 2021-09-26 DIAGNOSIS — R634 Abnormal weight loss: Secondary | ICD-10-CM | POA: Diagnosis not present

## 2021-09-26 DIAGNOSIS — R1030 Lower abdominal pain, unspecified: Secondary | ICD-10-CM | POA: Diagnosis not present

## 2021-09-26 DIAGNOSIS — R180 Malignant ascites: Secondary | ICD-10-CM | POA: Diagnosis not present

## 2021-09-26 DIAGNOSIS — D61818 Other pancytopenia: Secondary | ICD-10-CM | POA: Diagnosis not present

## 2021-09-26 DIAGNOSIS — E785 Hyperlipidemia, unspecified: Secondary | ICD-10-CM | POA: Diagnosis not present

## 2021-09-26 DIAGNOSIS — C5702 Malignant neoplasm of left fallopian tube: Secondary | ICD-10-CM | POA: Diagnosis not present

## 2021-09-27 DIAGNOSIS — E785 Hyperlipidemia, unspecified: Secondary | ICD-10-CM | POA: Diagnosis not present

## 2021-09-27 DIAGNOSIS — R180 Malignant ascites: Secondary | ICD-10-CM | POA: Diagnosis not present

## 2021-09-27 DIAGNOSIS — R634 Abnormal weight loss: Secondary | ICD-10-CM | POA: Diagnosis not present

## 2021-09-27 DIAGNOSIS — C5702 Malignant neoplasm of left fallopian tube: Secondary | ICD-10-CM | POA: Diagnosis not present

## 2021-09-27 DIAGNOSIS — R1030 Lower abdominal pain, unspecified: Secondary | ICD-10-CM | POA: Diagnosis not present

## 2021-09-27 DIAGNOSIS — D61818 Other pancytopenia: Secondary | ICD-10-CM | POA: Diagnosis not present

## 2021-09-28 ENCOUNTER — Inpatient Hospital Stay: Admit: 2021-09-28 | Payer: MEDICARE | Primary: Family Medicine

## 2021-09-28 ENCOUNTER — Ambulatory Visit
Admit: 2021-09-28 | Discharge: 2021-09-28 | Payer: MEDICARE | Attending: Hematology & Oncology | Primary: Family Medicine

## 2021-09-28 DIAGNOSIS — C3490 Malignant neoplasm of unspecified part of unspecified bronchus or lung: Secondary | ICD-10-CM

## 2021-09-28 DIAGNOSIS — Z5112 Encounter for antineoplastic immunotherapy: Secondary | ICD-10-CM

## 2021-09-28 DIAGNOSIS — R634 Abnormal weight loss: Secondary | ICD-10-CM | POA: Diagnosis not present

## 2021-09-28 DIAGNOSIS — E785 Hyperlipidemia, unspecified: Secondary | ICD-10-CM | POA: Diagnosis not present

## 2021-09-28 DIAGNOSIS — R1030 Lower abdominal pain, unspecified: Secondary | ICD-10-CM | POA: Diagnosis not present

## 2021-09-28 DIAGNOSIS — C5702 Malignant neoplasm of left fallopian tube: Secondary | ICD-10-CM | POA: Diagnosis not present

## 2021-09-28 DIAGNOSIS — R180 Malignant ascites: Secondary | ICD-10-CM | POA: Diagnosis not present

## 2021-09-28 DIAGNOSIS — D61818 Other pancytopenia: Secondary | ICD-10-CM | POA: Diagnosis not present

## 2021-09-28 LAB — CBC WITH AUTO DIFFERENTIAL
Basophils %: 1 % (ref 0–1)
Basophils Absolute: 0.1 10*3/uL (ref 0.0–0.1)
Eosinophils %: 4 % (ref 0–7)
Eosinophils Absolute: 0.5 10*3/uL — ABNORMAL HIGH (ref 0.0–0.4)
Granulocyte Absolute Count: 0.1 10*3/uL — ABNORMAL HIGH (ref 0.00–0.04)
Hematocrit: 34 % — ABNORMAL LOW (ref 35.0–47.0)
Hemoglobin: 10.9 g/dL — ABNORMAL LOW (ref 11.5–16.0)
Immature Granulocytes: 1 % — ABNORMAL HIGH (ref 0.0–0.5)
Lymphocytes %: 13 % (ref 12–49)
Lymphocytes Absolute: 1.6 10*3/uL (ref 0.8–3.5)
MCH: 27.1 PG (ref 26.0–34.0)
MCHC: 32.1 g/dL (ref 30.0–36.5)
MCV: 84.6 FL (ref 80.0–99.0)
MPV: 9.2 FL (ref 8.9–12.9)
Monocytes %: 9 % (ref 5–13)
Monocytes Absolute: 1.1 10*3/uL — ABNORMAL HIGH (ref 0.0–1.0)
NRBC Absolute: 0 10*3/uL (ref 0.00–0.01)
Neutrophils %: 72 % (ref 32–75)
Neutrophils Absolute: 9.1 10*3/uL — ABNORMAL HIGH (ref 1.8–8.0)
Nucleated RBCs: 0 PER 100 WBC
Platelets: 510 10*3/uL — ABNORMAL HIGH (ref 150–400)
RBC: 4.02 M/uL (ref 3.80–5.20)
RDW: 15.4 % — ABNORMAL HIGH (ref 11.5–14.5)
WBC: 12.5 10*3/uL — ABNORMAL HIGH (ref 3.6–11.0)

## 2021-09-28 LAB — COMPREHENSIVE METABOLIC PANEL
ALT: 36 U/L (ref 12–78)
AST: 18 U/L (ref 15–37)
Albumin/Globulin Ratio: 0.6 — ABNORMAL LOW (ref 1.1–2.2)
Albumin: 2.9 g/dL — ABNORMAL LOW (ref 3.5–5.0)
Alkaline Phosphatase: 74 U/L (ref 45–117)
Anion Gap: 6 mmol/L (ref 5–15)
BUN: 32 MG/DL — ABNORMAL HIGH (ref 6–20)
Bun/Cre Ratio: 30 — ABNORMAL HIGH (ref 12–20)
CO2: 28 mmol/L (ref 21–32)
Calcium: 9.4 MG/DL (ref 8.5–10.1)
Chloride: 103 mmol/L (ref 97–108)
Creatinine: 1.05 MG/DL — ABNORMAL HIGH (ref 0.55–1.02)
ESTIMATED GLOMERULAR FILTRATION RATE: 54 mL/min/{1.73_m2} — ABNORMAL LOW (ref >60)
Globulin: 4.8 g/dL — ABNORMAL HIGH (ref 2.0–4.0)
Glucose: 171 mg/dL — ABNORMAL HIGH (ref 65–100)
Potassium: 3.6 mmol/L (ref 3.5–5.1)
Sodium: 137 mmol/L (ref 136–145)
Total Bilirubin: 0.4 MG/DL (ref 0.2–1.0)
Total Protein: 7.7 g/dL (ref 6.4–8.2)

## 2021-09-28 LAB — CBC WITH AUTOMATED DIFF
ABS. BASOPHILS: 0.1 10*3/uL (ref 0.0–0.1)
ABS. EOSINOPHILS: 0.5 10*3/uL — ABNORMAL HIGH (ref 0.0–0.4)
ABS. IMM. GRANS.: 0.1 10*3/uL — ABNORMAL HIGH (ref 0.00–0.04)
ABS. LYMPHOCYTES: 1.6 10*3/uL (ref 0.8–3.5)
ABS. MONOCYTES: 1.1 10*3/uL — ABNORMAL HIGH (ref 0.0–1.0)
ABS. NEUTROPHILS: 9.1 10*3/uL — ABNORMAL HIGH (ref 1.8–8.0)
ABSOLUTE NRBC: 0 10*3/uL (ref 0.00–0.01)
BASOPHILS: 1 % (ref 0–1)
EOSINOPHILS: 4 % (ref 0–7)
HCT: 34 % — ABNORMAL LOW (ref 35.0–47.0)
HGB: 10.9 g/dL — ABNORMAL LOW (ref 11.5–16.0)
IMMATURE GRANULOCYTES: 1 % — ABNORMAL HIGH (ref 0.0–0.5)
LYMPHOCYTES: 13 % (ref 12–49)
MCH: 27.1 PG (ref 26.0–34.0)
MCHC: 32.1 g/dL (ref 30.0–36.5)
MCV: 84.6 FL (ref 80.0–99.0)
MONOCYTES: 9 % (ref 5–13)
MPV: 9.2 FL (ref 8.9–12.9)
NEUTROPHILS: 72 % (ref 32–75)
NRBC: 0 PER 100 WBC
PLATELET: 510 10*3/uL — ABNORMAL HIGH (ref 150–400)
RBC: 4.02 M/uL (ref 3.80–5.20)
RDW: 15.4 % — ABNORMAL HIGH (ref 11.5–14.5)
WBC: 12.5 10*3/uL — ABNORMAL HIGH (ref 3.6–11.0)

## 2021-09-28 LAB — METABOLIC PANEL, COMPREHENSIVE
A-G Ratio: 0.6 — ABNORMAL LOW (ref 1.1–2.2)
ALT (SGPT): 36 U/L (ref 12–78)
AST (SGOT): 18 U/L (ref 15–37)
Albumin: 2.9 g/dL — ABNORMAL LOW (ref 3.5–5.0)
Alk. phosphatase: 74 U/L (ref 45–117)
Anion gap: 6 mmol/L (ref 5–15)
BUN/Creatinine ratio: 30 — ABNORMAL HIGH (ref 12–20)
BUN: 32 MG/DL — ABNORMAL HIGH (ref 6–20)
Bilirubin, total: 0.4 MG/DL (ref 0.2–1.0)
CO2: 28 mmol/L (ref 21–32)
Calcium: 9.4 MG/DL (ref 8.5–10.1)
Chloride: 103 mmol/L (ref 97–108)
Creatinine: 1.05 MG/DL — ABNORMAL HIGH (ref 0.55–1.02)
Globulin: 4.8 g/dL — ABNORMAL HIGH (ref 2.0–4.0)
Glucose: 171 mg/dL — ABNORMAL HIGH (ref 65–100)
Potassium: 3.6 mmol/L (ref 3.5–5.1)
Protein, total: 7.7 g/dL (ref 6.4–8.2)
Sodium: 137 mmol/L (ref 136–145)
eGFR: 54 mL/min/{1.73_m2} — ABNORMAL LOW (ref >60)

## 2021-09-28 MED ORDER — SODIUM CHLORIDE 0.9 % IV
INTRAVENOUS | Status: AC | PRN
Start: 2021-09-28 — End: 2021-09-29

## 2021-09-28 MED ORDER — SODIUM CHLORIDE 0.9 % IV
INTRAVENOUS | Status: DC | PRN
Start: 2021-09-28 — End: 2021-09-29
  Administered 2021-09-28: 20:00:00 via INTRAVENOUS

## 2021-09-28 MED ORDER — SODIUM CHLORIDE 0.9 % IJ SYRG
INTRAMUSCULAR | Status: DC | PRN
Start: 2021-09-28 — End: 2021-09-29

## 2021-09-28 MED ORDER — HYDROCODONE-ACETAMINOPHEN 5 MG-325 MG TAB
5-325 mg | ORAL_TABLET | Freq: Three times a day (TID) | ORAL | 0 refills | Status: AC | PRN
Start: 2021-09-28 — End: 2021-10-28

## 2021-09-28 MED ORDER — HEPARIN, PORCINE (PF) 100 UNIT/ML IV SYRINGE
100 unit/mL | INTRAVENOUS | Status: DC | PRN
Start: 2021-09-28 — End: 2021-09-29

## 2021-09-28 MED ORDER — SODIUM CHLORIDE 0.9 % INJECTION
INTRAMUSCULAR | Status: DC | PRN
Start: 2021-09-28 — End: 2021-09-29

## 2021-09-28 MED ORDER — SODIUM CHLORIDE 0.9% BOLUS IV
0.9 % | Freq: Once | INTRAVENOUS | Status: DC | PRN
Start: 2021-09-28 — End: 2021-09-29

## 2021-09-28 MED ORDER — OVERFILL VOLUME
25 mg/mL | Freq: Once | INTRAVENOUS | Status: AC
Start: 2021-09-28 — End: 2021-09-28
  Administered 2021-09-28: 20:00:00 via INTRAVENOUS

## 2021-09-28 MED FILL — SODIUM CHLORIDE 0.9 % IV: INTRAVENOUS | Qty: 500

## 2021-09-28 MED FILL — SODIUM CHLORIDE 0.9 % IV: INTRAVENOUS | Qty: 1000

## 2021-09-28 MED FILL — BD POSIFLUSH NORMAL SALINE 0.9 % INJECTION SYRINGE: INTRAMUSCULAR | Qty: 40

## 2021-09-28 NOTE — Progress Notes (Signed)
 [facility] Progress Note    Date: September 28, 2021    Name: Dawn Green    MRN: [id number]         DOB: 05-05-1941    Dawn Green Arrived ambulatory and in no distress for C4D1 of Keytruda Regimen.  Assessment was completed, no acute issues at this time, no new complaints voiced.  R chest wall port accessed without difficulty, labs drawn & sent for processing by Caitlyn, RN.    Patient politely declined fluids for today.    Chemotherapy Flowsheet 09/28/2021   Cycle C4D1   Date 09/28/2021   Drug / Regimen Keytruda   Pre Hydration -   Notes -        Patient proceed to appointment with Dr. Junie    Dawn Green's vitals were reviewed.  Visit Vitals  BP (!) 97/45   Pulse 95   Temp 98.1 F (36.7 C)   Resp 18   Ht 5' 3 (1.6 m)   Wt 58.9 kg (129 lb 14.4 oz)   BMI 23.01 kg/m       Lab results were obtained and reviewed.  Recent Results (from the past 12 hour(s))   CBC WITH AUTOMATED DIFF    Collection Time: 09/28/21  1:15 PM   Result Value Ref Range    WBC 12.5 (H) 3.6 - 11.0 K/uL    RBC 4.02 3.80 - 5.20 M/uL    HGB 10.9 (L) 11.5 - 16.0 g/dL    HCT 65.9 (L) 64.9 - 47.0 %    MCV 84.6 80.0 - 99.0 FL    MCH 27.1 26.0 - 34.0 PG    MCHC 32.1 30.0 - 36.5 g/dL    RDW 84.5 (H) 88.4 - 14.5 %    PLATELET 510 (H) 150 - 400 K/uL    MPV 9.2 8.9 - 12.9 FL    NRBC 0.0 0 PER 100 WBC    ABSOLUTE NRBC 0.00 0.00 - 0.01 K/uL    NEUTROPHILS 72 32 - 75 %    LYMPHOCYTES 13 12 - 49 %    MONOCYTES 9 5 - 13 %    EOSINOPHILS 4 0 - 7 %    BASOPHILS 1 0 - 1 %    IMMATURE GRANULOCYTES 1 (H) 0.0 - 0.5 %    ABS. NEUTROPHILS 9.1 (H) 1.8 - 8.0 K/UL    ABS. LYMPHOCYTES 1.6 0.8 - 3.5 K/UL    ABS. MONOCYTES 1.1 (H) 0.0 - 1.0 K/UL    ABS. EOSINOPHILS 0.5 (H) 0.0 - 0.4 K/UL    ABS. BASOPHILS 0.1 0.0 - 0.1 K/UL    ABS. IMM. GRANS. 0.1 (H) 0.00 - 0.04 K/UL    DF AUTOMATED     METABOLIC PANEL, COMPREHENSIVE    Collection Time: 09/28/21  1:15 PM   Result Value Ref Range    Sodium 137 136 - 145 mmol/L    Potassium 3.6 3.5 - 5.1 mmol/L    Chloride 103 97 - 108 mmol/L    CO2  28 21 - 32 mmol/L    Anion gap 6 5 - 15 mmol/L    Glucose 171 (H) 65 - 100 mg/dL    BUN 32 (H) 6 - 20 MG/DL    Creatinine 8.94 (H) 0.55 - 1.02 MG/DL    BUN/Creatinine ratio 30 (H) 12 - 20      eGFR 54 (L) >60 ml/min/1.20m2    Calcium 9.4 8.5 - 10.1 MG/DL    Bilirubin, total 0.4 0.2 - 1.0  MG/DL    ALT (SGPT) 36 12 - 78 U/L    AST (SGOT) 18 15 - 37 U/L    Alk. phosphatase 74 45 - 117 U/L    Protein, total 7.7 6.4 - 8.2 g/dL    Albumin 2.9 (L) 3.5 - 5.0 g/dL    Globulin 4.8 (H) 2.0 - 4.0 g/dL    A-G Ratio 0.6 (L) 1.1 - 2.2         Medications:  Medications Administered       0.9% sodium chloride infusion       Admin Date  09/28/2021 Action  New Bag Dose  25 mL/hr Rate  25 mL/hr Route  IntraVENous Administered By  Leigh Jacobsen              pembrolizumab [facility]) 200 mg in 0.9% sodium chloride 100 mL, overfill volume 10 mL IVPB       Admin Date  09/28/2021 Action  New Bag Dose  200 mg Rate  236 mL/hr Route  IntraVENous Administered By  Leigh Jacobsen                         Two nurses verified prior to administering: Drug name, drug dose, infusion volume or drug volume when prepared in a syringe, rate of administration, route of administration,  expiration dates and/or times, appearance and physical integrity of the drugs, rate set on infusion pump, when used sequencing of drug administration.           Dawn Green tolerated treatment well and [facility].   Port de-accessed, flushed & heparinized per protocol. She is to return on January 3  for her next appointment.    Jacobsen Leigh  September 28, 2021

## 2021-09-28 NOTE — Progress Notes (Signed)
Progress  Notes by RaddinReuel Derby, MD at 09/28/21 1315                Author: Heloise Purpura, MD  Service: --  Author Type: Physician       Filed: 09/28/21 1615  Encounter Date: 09/28/2021  Status: Signed          Editor: Walt Geathers, Reuel Derby, MD (Physician)                       [place] [facility]   [address], [address] [place] [address]   W: [phone]  F: [phone]           Reason for Visit:     Dawn Green is a 80 y.o. female who is seen in follow up for evaluation of lung cancer.        Hematology/Oncology Treatment History:     ??  CT Chest 06/11/2021: Large lingular mass, compatible with neoplasm. Bulky mediastinal  and left hilar adenopathy, compatible with metastatic disease.  Small left pleural effusion, likely malignant.   ??  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  CT A/P 07/06/2021: Incompletely visualized 6.4 cm x 4.6 cm lingular soft tissue mass. Trace left pleural fluid.   ??  MRI Brain 07/06/2021: Nonenhancing/minimally enhancing ring lesion of the (hemorrhagic/melanotic) left parietal mass lesion measures 11 x 15 x 16 mm in size. Ring-enhancing lesion with minimal associated  susceptibility. There is no significant midline shift or mass effect. There is no significant associated vasogenic edema. Despite its atypical appearance most likely represents a metastasis, not fully delineated. Mild cerebral atrophy and chronic microvascular  ischemic change. No other evidence of intracranial metastasis is definitively demonstrated.    ??  PET/CT 07/09/2021: Large lingular mass demonstrates increased tracer activity and is compatible with the known neoplasm. Hypermetabolic bilateral  supraclavicular, mediastinal, and left hilar lymphadenopathy. Hypermetabolic bone lesions concerning for metastatic disease.   ??  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  Gamma knife with Dr. Oletta Darter on 07/21/2021   ??  Initiated palliative  systemic therapy with Pembrolizumab on 07/27/2021        History of Present Illness:     Presents for follow up on therapy. Has been taking Zyrtec since last visit. There are still some residual areas that remain itchy, she will use cream for these areas.       No nausea, vomiting. No change in bowels. No blood loss noted. Had a fall since last visit  in the bathroom, lost her balance.       Having pain in the last 2 weeks in left shoulder, this seems to come and go. Pain radiates down to left flank.       No fever, chills, SOB at rest or chest pain. Walking with use of cane.        Reports significant fatigue.       She was previously seen by Dr. Laurena Bering for breast cancer.  She had a right lumpectomy 04/2013, radiation 06/2023, and anastrozole from 07/2013 to 07/2018.  She was discharged from his follow up 07/2018.      She is accompanied by her husband today.  She lives with her husband and son in [place].  She previously smoked 1ppd, quit 25 years before diagnosis.      Review of systems was obtained and  pertinent findings reviewed above. Past medical history, social history, family history, medications, and allergies are located in the electronic medical record.        Physical Exam:     Visit Vitals      BP  (!) 97/45     Pulse  95     Temp  98.1 ??F (36.7 ??C)     Resp  18        SpO2  98%           ECOG PS: 1   General: no distress   Eyes: anicteric sclerae   HENT: oropharynx clear   Neck: supple   Lymphatic: no cervical, supraclavicular, or inguinal adenopathy   Respiratory: normal respiratory effort   CV: no peripheral edema   GI: soft, nontender, nondistended, no masses   Skin: no rashes, no ecchymoses, no petechiae   Unstable gait        Results:          Lab Results         Component  Value  Date/Time            WBC  12.5 (H)  09/28/2021 01:15 PM       HGB  10.9 (L)  09/28/2021 01:15 PM       HCT  34.0 (L)  09/28/2021 01:15 PM       PLATELET  510 (H)  09/28/2021 01:15 PM       MCV  84.6  09/28/2021 01:15 PM             ABS. NEUTROPHILS  9.1 (H)  09/28/2021 01:15 PM          Lab Results         Component  Value  Date/Time            Sodium  137  09/28/2021 01:15 PM       Potassium  3.6  09/28/2021 01:15 PM       Chloride  103  09/28/2021 01:15 PM       CO2  28  09/28/2021 01:15 PM       Glucose  171 (H)  09/28/2021 01:15 PM       BUN  32 (H)  09/28/2021 01:15 PM       Creatinine  1.05 (H)  09/28/2021 01:15 PM       GFR est AA  >60  06/11/2021 10:14 AM       GFR est non-AA  >60  06/11/2021 10:14 AM       Calcium  9.4  09/28/2021 01:15 PM       Glucose (POC)  137 (H)  07/23/2021 12:58 PM            Creatinine (POC)  0.8  03/26/2013 01:52 PM          Lab Results         Component  Value  Date/Time            Bilirubin, total  0.4  09/28/2021 01:15 PM       ALT (SGPT)  36  09/28/2021 01:15 PM       Alk. phosphatase  74  09/28/2021 01:15 PM       Protein, total  7.7  09/28/2021 01:15 PM       Albumin  2.9 (L)  09/28/2021 01:15 PM            Globulin  4.8 (H)  09/28/2021 01:15 PM  Test results above have been reviewed.        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  Imaging notes extensive disease within her left lung, bilateral supraclavicular and mediastinal nodes, bones, and brain.  Her cancer is  not curable and management is with palliative intent.      Due to high PDL1, she initiated therapy with single agent pembrolizumab.        Tolerating therapy with mild itching and increased fatigue. Will proceed with next cycle today as ordered. CT imaging after this cycle-patient will plan to have done on 12/21 on same day as ECHO      She has elected against enrollment in the DIRECT study.        FoundationOne was unable to be run on sample.        2) Brain metastasis   Isolated metastasis on MRI.  She was seen by Dr. Oletta Darter (neurosurgery) and underwent gamma knife on 10/5. MRI done at time of procedure with  2 new lesions, uncertain if cancer lesions vs infarcts.      Repeat MRI on 10/21/2020.       3) Cough   Improving. Taking cough syrup PRN.      4) H/o breast cancer   S/p lumpectomy, radiation, and endocrine therapy.      5) Weight loss   Dietician supportive phone call on 07/16/2021. Continue 2 boosts/daily. Stable on therapy.       6) Emotional Well Being   No psychosocial concerns identified today. Patient has adequate support.       7) Itching   Zyrtec daily, Benadryl PRN      8) Hypotension   Seen by cardiology, had change in BP medications (reduced). Continue NS bolus day of therapy PRN.       9) Recurrent falls   Offered referral to PT for evaluation in the home. She wishes to hold off on this for now.       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 Thyroid studies are normal. Seeing cardiology-likely hypotension contributing. Will have upcoming ECHO. Obtain random cortisol level next visit.       11) Left shoulder/flank pain   Imaging upcoming. Will initiate Hydrocodone PRN. Has constipation at baseline, monitor this closely.         Plan:        ??  Proceed today with C4 Pembrolizumab (235m) given every 3 weeks   ??  Labs: CBC, CMP prior to each cycle, TSH every 6 weeks   ??  NS bolus today-5074m  ??  CT imaging prior to next visit   ??  Hydrocodone PRN   ??  Follow up every 3 weeks      I personally saw and evaluated the patient and performed the key components of medical decision making.  The history, physical exam, and documentation were performed by MeLucrezia EuropeNP.  I reviewed and verified the above documentation and modified it  as needed. She is tolerating systemic therapy with manageable toxicity, so we will proceed with treatment.            Signed By:  RyHeloise PurpuraMD

## 2021-09-28 NOTE — Progress Notes (Signed)
Dawn Green is a 80 y.o. female follow up for lung cancer.    1. Have you been to the ER, urgent care clinic since your last visit?  Hospitalized since your last visit?no    2. Have you seen or consulted any other health care providers outside of the [facility] since your last visit?  Include any pap smears or colon screening. No    Vitals 09/28/2021   Blood Pressure 97/45   Pulse 95   Temp 98.1   Resp 18   Height    Weight    SpO2    BSA

## 2021-09-29 DIAGNOSIS — E785 Hyperlipidemia, unspecified: Secondary | ICD-10-CM | POA: Diagnosis not present

## 2021-09-29 DIAGNOSIS — C5702 Malignant neoplasm of left fallopian tube: Secondary | ICD-10-CM | POA: Diagnosis not present

## 2021-09-29 DIAGNOSIS — R180 Malignant ascites: Secondary | ICD-10-CM | POA: Diagnosis not present

## 2021-09-29 DIAGNOSIS — R1030 Lower abdominal pain, unspecified: Secondary | ICD-10-CM | POA: Diagnosis not present

## 2021-09-29 DIAGNOSIS — R634 Abnormal weight loss: Secondary | ICD-10-CM | POA: Diagnosis not present

## 2021-09-29 DIAGNOSIS — D61818 Other pancytopenia: Secondary | ICD-10-CM | POA: Diagnosis not present

## 2021-09-30 DIAGNOSIS — E785 Hyperlipidemia, unspecified: Secondary | ICD-10-CM | POA: Diagnosis not present

## 2021-09-30 DIAGNOSIS — R1030 Lower abdominal pain, unspecified: Secondary | ICD-10-CM | POA: Diagnosis not present

## 2021-09-30 DIAGNOSIS — D61818 Other pancytopenia: Secondary | ICD-10-CM | POA: Diagnosis not present

## 2021-09-30 DIAGNOSIS — R634 Abnormal weight loss: Secondary | ICD-10-CM | POA: Diagnosis not present

## 2021-09-30 DIAGNOSIS — R180 Malignant ascites: Secondary | ICD-10-CM | POA: Diagnosis not present

## 2021-09-30 DIAGNOSIS — C5702 Malignant neoplasm of left fallopian tube: Secondary | ICD-10-CM | POA: Diagnosis not present

## 2021-10-06 ENCOUNTER — Inpatient Hospital Stay: Admit: 2021-10-06 | Payer: MEDICARE | Attending: Family | Primary: Family Medicine

## 2021-10-06 ENCOUNTER — Ambulatory Visit: Admit: 2021-10-06 | Discharge: 2021-10-06 | Payer: MEDICARE | Primary: Family Medicine

## 2021-10-06 ENCOUNTER — Ambulatory Visit

## 2021-10-06 DIAGNOSIS — C3490 Malignant neoplasm of unspecified part of unspecified bronchus or lung: Secondary | ICD-10-CM

## 2021-10-06 DIAGNOSIS — I10 Essential (primary) hypertension: Secondary | ICD-10-CM

## 2021-10-06 MED ORDER — IOPAMIDOL 76 % IV SOLN
370 mg iodine /mL (76 %) | Freq: Once | INTRAVENOUS | Status: AC
Start: 2021-10-06 — End: 2021-10-06
  Administered 2021-10-06: 19:00:00 via INTRAVENOUS

## 2021-10-06 MED FILL — ISOVUE-370  76 % INTRAVENOUS SOLUTION: 370 mg iodine /mL (76 %) | INTRAVENOUS | Qty: 100

## 2021-10-06 NOTE — Progress Notes (Signed)
MyChart msg sent

## 2021-10-06 NOTE — Progress Notes (Signed)
Your echocardiogram reveals normal heart function and this is great news.  Bubble study was negative as well and this is good news.    All the best,    Elta Guadeloupe

## 2021-10-07 DIAGNOSIS — I10 Essential (primary) hypertension: Secondary | ICD-10-CM

## 2021-10-07 LAB — ECHO ADULT COMPLETE
AV Mean Gradient: 4 mmHg
AV Mean Velocity: 0.9 m/s
AV Peak Gradient: 7 mmHg
AV Peak Velocity: 1.4 m/s
AV VTI: 27.3 cm
AV Velocity Ratio: 0.93
Ao Root Index: 1.68 cm/m2
Aortic Root: 2.7 cm
Ascending Aorta Index: 1.8 cm/m2
Ascending Aorta: 2.9 cm
E/E' Lateral: 20.2
E/E' Ratio (Averaged): 20.2
E/E' Septal: 20.2
EF BP: 66 % (ref 55–100)
Est. RA Pressure: 3 mmHg
Fractional Shortening 2D: 40 % (ref 28–44)
IVSd: 1.1 cm — AB (ref 0.6–0.9)
LA Diameter: 2.8 cm
LA Size Index: 1.74 cm/m2
LA Volume 2C: 23 mL (ref 22–52)
LA Volume 4C: 22 mL (ref 22–52)
LA Volume A/L: 26 mL
LA Volume Index 2C: 14 mL/m2 — AB (ref 16–34)
LA Volume Index 4C: 14 mL/m2 — AB (ref 16–34)
LA Volume Index A/L: 16 mL/m2 (ref 16–34)
LA/AO Root Ratio: 1.04
LV E' Lateral Velocity: 5 cm/s
LV E' Septal Velocity: 5 cm/s
LV EDV A2C: 57 mL
LV EDV A4C: 70 mL
LV EDV BP: 64 mL (ref 56–104)
LV EDV Index A2C: 35 mL/m2
LV EDV Index A4C: 43 mL/m2
LV EDV Index BP: 40 mL/m2
LV ESV A2C: 22 mL
LV ESV A4C: 19 mL
LV ESV BP: 21 mL (ref 19–49)
LV ESV Index A2C: 14 mL/m2
LV ESV Index A4C: 12 mL/m2
LV ESV Index BP: 13 mL/m2
LV Ejection Fraction A2C: 61 %
LV Ejection Fraction A4C: 73 %
LV Mass 2D Index: 59.6 g/m2 (ref 43–95)
LV Mass 2D: 95.9 g (ref 67–162)
LV RWT Ratio: 0.46
LVIDd Index: 2.17 cm/m2
LVIDd: 3.5 cm — AB (ref 3.9–5.3)
LVIDs Index: 1.3 cm/m2
LVIDs: 2.1 cm
LVOT Mean Gradient: 3 mmHg
LVOT Peak Gradient: 7 mmHg
LVOT Peak Velocity: 1.3 m/s
LVOT VTI: 24.4 cm
LVOT:AV VTI Index: 0.89
LVPWd: 0.8 cm (ref 0.6–0.9)
MV A Velocity: 1.08 m/s
MV Area by PHT: 3.3 cm2
MV E Velocity: 1.01 m/s
MV E Wave Deceleration Time: 226.5 ms
MV E/A: 0.94
MV PHT: 65.7 ms
RA Area 4C: 12.6 cm2
RA Volume A/L: 30 mL
RA Volume Index A4C: 18 mL/m2
RA Volume: 29 ml
RVIDd: 2.7 cm
RVSP: 19 mmHg
TAPSE: 2.3 cm (ref 1.7–?)
TR Max Velocity: 2.01 m/s
TR Peak Gradient: 16 mmHg

## 2021-10-14 MED FILL — KEYTRUDA 25 MG/ML INTRAVENOUS SOLUTION: 25 mg/mL | INTRAVENOUS | Qty: 8

## 2021-10-17 DEATH — deceased

## 2021-10-19 ENCOUNTER — Inpatient Hospital Stay: Admit: 2021-10-19 | Payer: MEDICARE | Primary: Family Medicine

## 2021-10-19 ENCOUNTER — Ambulatory Visit
Admit: 2021-10-19 | Discharge: 2021-10-19 | Payer: MEDICARE | Attending: Hematology & Oncology | Primary: Family Medicine

## 2021-10-19 DIAGNOSIS — C3490 Malignant neoplasm of unspecified part of unspecified bronchus or lung: Secondary | ICD-10-CM

## 2021-10-19 DIAGNOSIS — Z5112 Encounter for antineoplastic immunotherapy: Secondary | ICD-10-CM

## 2021-10-19 LAB — COMPREHENSIVE METABOLIC PANEL
ALT: 30 U/L (ref 12–78)
AST: 16 U/L (ref 15–37)
Albumin/Globulin Ratio: 0.6 — ABNORMAL LOW (ref 1.1–2.2)
Albumin: 2.8 g/dL — ABNORMAL LOW (ref 3.5–5.0)
Alkaline Phosphatase: 75 U/L (ref 45–117)
Anion Gap: 7 mmol/L (ref 5–15)
BUN: 22 MG/DL — ABNORMAL HIGH (ref 6–20)
Bun/Cre Ratio: 30 — ABNORMAL HIGH (ref 12–20)
CO2: 27 mmol/L (ref 21–32)
Calcium: 9.3 MG/DL (ref 8.5–10.1)
Chloride: 105 mmol/L (ref 97–108)
Creatinine: 0.73 MG/DL (ref 0.55–1.02)
ESTIMATED GLOMERULAR FILTRATION RATE: >60 mL/min/{1.73_m2} (ref >60)
Globulin: 4.7 g/dL — ABNORMAL HIGH (ref 2.0–4.0)
Glucose: 134 mg/dL — ABNORMAL HIGH (ref 65–100)
Potassium: 3.9 mmol/L (ref 3.5–5.1)
Sodium: 139 mmol/L (ref 136–145)
Total Bilirubin: 0.3 MG/DL (ref 0.2–1.0)
Total Protein: 7.5 g/dL (ref 6.4–8.2)

## 2021-10-19 LAB — CBC WITH AUTO DIFFERENTIAL
Basophils %: 1 % (ref 0–1)
Basophils Absolute: 0.1 10*3/uL (ref 0.0–0.1)
Eosinophils %: 6 % (ref 0–7)
Eosinophils Absolute: 0.7 10*3/uL — ABNORMAL HIGH (ref 0.0–0.4)
Granulocyte Absolute Count: 0.1 10*3/uL — ABNORMAL HIGH (ref 0.00–0.04)
Hematocrit: 32.8 % — ABNORMAL LOW (ref 35.0–47.0)
Hemoglobin: 10.2 g/dL — ABNORMAL LOW (ref 11.5–16.0)
Immature Granulocytes: 1 % — ABNORMAL HIGH (ref 0.0–0.5)
Lymphocytes %: 14 % (ref 12–49)
Lymphocytes Absolute: 1.7 10*3/uL (ref 0.8–3.5)
MCH: 26.9 PG (ref 26.0–34.0)
MCHC: 31.1 g/dL (ref 30.0–36.5)
MCV: 86.5 FL (ref 80.0–99.0)
MPV: 9 FL (ref 8.9–12.9)
Monocytes %: 11 % (ref 5–13)
Monocytes Absolute: 1.3 10*3/uL — ABNORMAL HIGH (ref 0.0–1.0)
NRBC Absolute: 0 10*3/uL (ref 0.00–0.01)
Neutrophils %: 67 % (ref 32–75)
Neutrophils Absolute: 8.1 10*3/uL — ABNORMAL HIGH (ref 1.8–8.0)
Nucleated RBCs: 0 PER 100 WBC
Platelets: 432 10*3/uL — ABNORMAL HIGH (ref 150–400)
RBC: 3.79 M/uL — ABNORMAL LOW (ref 3.80–5.20)
RDW: 15 % — ABNORMAL HIGH (ref 11.5–14.5)
WBC: 11.9 10*3/uL — ABNORMAL HIGH (ref 3.6–11.0)

## 2021-10-19 LAB — TSH 3RD GENERATION
TSH: 1.51 u[IU]/mL (ref 0.36–3.74)
TSH: 1.51 u[IU]/mL (ref 0.36–3.74)

## 2021-10-19 LAB — CORTISOL
Cortisol, random: 14.1 ug/dL
Cortisol: 14.1 ug/dL

## 2021-10-19 LAB — METABOLIC PANEL, COMPREHENSIVE
A-G Ratio: 0.6 — ABNORMAL LOW (ref 1.1–2.2)
ALT (SGPT): 30 U/L (ref 12–78)
AST (SGOT): 16 U/L (ref 15–37)
Albumin: 2.8 g/dL — ABNORMAL LOW (ref 3.5–5.0)
Alk. phosphatase: 75 U/L (ref 45–117)
Anion gap: 7 mmol/L (ref 5–15)
BUN/Creatinine ratio: 30 — ABNORMAL HIGH (ref 12–20)
BUN: 22 MG/DL — ABNORMAL HIGH (ref 6–20)
Bilirubin, total: 0.3 mg/dL (ref 0.2–1.0)
CO2: 27 mmol/L (ref 21–32)
Calcium: 9.3 mg/dL (ref 8.5–10.1)
Chloride: 105 mmol/L (ref 97–108)
Creatinine: 0.73 mg/dL (ref 0.55–1.02)
Globulin: 4.7 g/dL — ABNORMAL HIGH (ref 2.0–4.0)
Glucose: 134 mg/dL — ABNORMAL HIGH (ref 65–100)
Potassium: 3.9 mmol/L (ref 3.5–5.1)
Protein, total: 7.5 g/dL (ref 6.4–8.2)
Sodium: 139 mmol/L (ref 136–145)
eGFR: >60 mL/min/{1.73_m2} (ref >60)

## 2021-10-19 LAB — CBC WITH AUTOMATED DIFF
ABS. BASOPHILS: 0.1 10*3/uL (ref 0.0–0.1)
ABS. EOSINOPHILS: 0.7 10*3/uL — ABNORMAL HIGH (ref 0.0–0.4)
ABS. IMM. GRANS.: 0.1 10*3/uL — ABNORMAL HIGH (ref 0.00–0.04)
ABS. LYMPHOCYTES: 1.7 10*3/uL (ref 0.8–3.5)
ABS. MONOCYTES: 1.3 10*3/uL — ABNORMAL HIGH (ref 0.0–1.0)
ABS. NEUTROPHILS: 8.1 10*3/uL — ABNORMAL HIGH (ref 1.8–8.0)
ABSOLUTE NRBC: 0 10*3/uL (ref 0.00–0.01)
BASOPHILS: 1 % (ref 0–1)
EOSINOPHILS: 6 % (ref 0–7)
HCT: 32.8 % — ABNORMAL LOW (ref 35.0–47.0)
HGB: 10.2 g/dL — ABNORMAL LOW (ref 11.5–16.0)
IMMATURE GRANULOCYTES: 1 % — ABNORMAL HIGH (ref 0.0–0.5)
LYMPHOCYTES: 14 % (ref 12–49)
MCH: 26.9 pg (ref 26.0–34.0)
MCHC: 31.1 g/dL (ref 30.0–36.5)
MCV: 86.5 fL (ref 80.0–99.0)
MONOCYTES: 11 % (ref 5–13)
MPV: 9 fL (ref 8.9–12.9)
NEUTROPHILS: 67 % (ref 32–75)
NRBC: 0 /100{WBCs}
PLATELET: 432 10*3/uL — ABNORMAL HIGH (ref 150–400)
RBC: 3.79 M/uL — ABNORMAL LOW (ref 3.80–5.20)
RDW: 15 % — ABNORMAL HIGH (ref 11.5–14.5)
WBC: 11.9 10*3/uL — ABNORMAL HIGH (ref 3.6–11.0)

## 2021-10-19 MED ORDER — SODIUM CHLORIDE 0.9 % IV
INTRAVENOUS | Status: DC | PRN
Start: 2021-10-19 — End: 2021-10-20
  Administered 2021-10-19: 20:00:00 via INTRAVENOUS

## 2021-10-19 MED ORDER — HEPARIN, PORCINE (PF) 100 UNIT/ML IV SYRINGE
100 unit/mL | INTRAVENOUS | Status: AC | PRN
Start: 2021-10-19 — End: 2021-10-20

## 2021-10-19 MED ORDER — SODIUM CHLORIDE 0.9% BOLUS IV
0.9 % | Freq: Once | INTRAVENOUS | Status: AC | PRN
Start: 2021-10-19 — End: 2021-10-20

## 2021-10-19 MED ORDER — PEMBROLIZUMAB 100 MG/4 ML (25 MG/ML) INTRAVENOUS SOLUTION
25 mg/mL | Freq: Once | INTRAVENOUS | Status: AC
Start: 2021-10-19 — End: 2021-10-19
  Administered 2021-10-19: 20:00:00 via INTRAVENOUS

## 2021-10-19 MED ORDER — SODIUM CHLORIDE 0.9 % IJ SYRG
INTRAMUSCULAR | Status: AC | PRN
Start: 2021-10-19 — End: 2021-10-20

## 2021-10-19 MED FILL — SODIUM CHLORIDE 0.9 % IV: INTRAVENOUS | Qty: 500

## 2021-10-19 MED FILL — BD POSIFLUSH NORMAL SALINE 0.9 % INJECTION SYRINGE: INTRAMUSCULAR | Qty: 40

## 2021-10-19 MED FILL — SODIUM CHLORIDE 0.9 % IV: INTRAVENOUS | Qty: 1000

## 2021-10-19 NOTE — Progress Notes (Signed)
[facility] Progress Note    Date: October 19, 2021    Name: Dawn Green    MRN: [id number]         DOB: 1941-02-10    Ms. Dawn Green Arrived ambulatory and in no distress for C5D1 of Keytruda Regimen.  Assessment & port access completed by Henriette Combs, RN. Confirmed with NP that patient does not need PRN bolus today.     Chemotherapy Flowsheet 10/19/2021   Cycle C5D1   Date 10/19/2021   Drug / Regimen Keytruda   Pre Hydration -   Notes -         Dawn Green's vitals were reviewed.    Patient Vitals for the past 12 hrs:   Temp Pulse Resp BP SpO2   10/19/21 1532 -- 78 -- (!) 99/50 --   10/19/21 1302 97.8 F (36.6 C) 87 16 102/62 98 %       Lab results were obtained and reviewed.  Recent Results (from the past 12 hour(s))   CBC WITH AUTOMATED DIFF    Collection Time: 10/19/21  1:07 PM   Result Value Ref Range    WBC 11.9 (H) 3.6 - 11.0 K/uL    RBC 3.79 (L) 3.80 - 5.20 M/uL    HGB 10.2 (L) 11.5 - 16.0 g/dL    HCT 08.6 (L) 57.8 - 47.0 %    MCV 86.5 80.0 - 99.0 FL    MCH 26.9 26.0 - 34.0 PG    MCHC 31.1 30.0 - 36.5 g/dL    RDW 46.9 (H) 62.9 - 14.5 %    PLATELET 432 (H) 150 - 400 K/uL    MPV 9.0 8.9 - 12.9 FL    NRBC 0.0 0 PER 100 WBC    ABSOLUTE NRBC 0.00 0.00 - 0.01 K/uL    NEUTROPHILS 67 32 - 75 %    LYMPHOCYTES 14 12 - 49 %    MONOCYTES 11 5 - 13 %    EOSINOPHILS 6 0 - 7 %    BASOPHILS 1 0 - 1 %    IMMATURE GRANULOCYTES 1 (H) 0.0 - 0.5 %    ABS. NEUTROPHILS 8.1 (H) 1.8 - 8.0 K/UL    ABS. LYMPHOCYTES 1.7 0.8 - 3.5 K/UL    ABS. MONOCYTES 1.3 (H) 0.0 - 1.0 K/UL    ABS. EOSINOPHILS 0.7 (H) 0.0 - 0.4 K/UL    ABS. BASOPHILS 0.1 0.0 - 0.1 K/UL    ABS. IMM. GRANS. 0.1 (H) 0.00 - 0.04 K/UL    DF AUTOMATED     METABOLIC PANEL, COMPREHENSIVE    Collection Time: 10/19/21  1:07 PM   Result Value Ref Range    Sodium 139 136 - 145 mmol/L    Potassium 3.9 3.5 - 5.1 mmol/L    Chloride 105 97 - 108 mmol/L    CO2 27 21 - 32 mmol/L    Anion gap 7 5 - 15 mmol/L    Glucose 134 (H) 65 - 100 mg/dL    BUN 22 (H) 6 - 20 MG/DL    Creatinine 5.28 4.13 - 1.02 MG/DL     BUN/Creatinine ratio 30 (H) 12 - 20      eGFR >60 >60 ml/min/1.74m2    Calcium 9.3 8.5 - 10.1 MG/DL    Bilirubin, total 0.3 0.2 - 1.0 MG/DL    ALT (SGPT) 30 12 - 78 U/L    AST (SGOT) 16 15 - 37 U/L    Alk. phosphatase 75 45 - 117 U/L  Protein, total 7.5 6.4 - 8.2 g/dL    Albumin 2.8 (L) 3.5 - 5.0 g/dL    Globulin 4.7 (H) 2.0 - 4.0 g/dL    A-G Ratio 0.6 (L) 1.1 - 2.2     TSH 3RD GENERATION    Collection Time: 10/19/21  1:07 PM   Result Value Ref Range    TSH 1.51 0.36 - 3.74 uIU/mL       Medications:    Medications Administered       0.9% sodium chloride infusion       Admin Date  10/19/2021 Action  New Bag Dose  25 mL/hr Rate  25 mL/hr Route  IntraVENous Administered By  Rae Halsted, RN              pembrolizumab (KEYTRUDA) 200 mg in 0.9% sodium chloride 100 mL, overfill volume 10 mL IVPB       Admin Date  10/19/2021 Action  New Bag Dose  200 mg Rate  236 mL/hr Route  IntraVENous Administered By  Rae Halsted, RN                        Two nurses verified prior to administering: Drug name, drug dose, infusion volume or drug volume when prepared in a syringe, rate of administration, route of administration,  expiration dates and/or times, appearance and physical integrity of the drugs, rate set on infusion pump, when used sequencing of drug administration.           Dawn Green tolerated treatment well and [facility] condition.   Port de-accessed, flushed & heparinized per protocol. Dawn Green is to return on 11/09/21 for her next appointment.    Rae Halsted, RN  October 19, 2021

## 2021-10-19 NOTE — Progress Notes (Signed)
Progress  Notes by RaddinReuel Derby, MD at 10/19/21 1315                Author: Heloise Purpura, MD  Service: --  Author Type: Physician       Filed: 10/19/21 1522  Encounter Date: 10/19/2021  Status: Signed          Editor: Myer Bohlman, Reuel Derby, MD (Physician)                       [facility]   [address], [address] [place] [address]   W: [phone]  F: [phone]           Reason for Visit:     Dawn Green is a 81 y.o. female who is seen in follow up for evaluation of lung cancer.        Hematology/Oncology Treatment History:     ??  CT Chest 06/11/2021: Large lingular mass, compatible with neoplasm. Bulky mediastinal  and left hilar adenopathy, compatible with metastatic disease.  Small left pleural effusion, likely malignant.   ??  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  CT A/P 07/06/2021: Incompletely visualized 6.4 cm x 4.6 cm lingular soft tissue mass. Trace left pleural fluid.   ??  MRI Brain 07/06/2021: Nonenhancing/minimally enhancing ring lesion of the (hemorrhagic/melanotic) left parietal mass lesion measures 11 x 15 x 16 mm in size. Ring-enhancing lesion with minimal associated  susceptibility. There is no significant midline shift or mass effect. There is no significant associated vasogenic edema. Despite its atypical appearance most likely represents a metastasis, not fully delineated. Mild cerebral atrophy and chronic microvascular  ischemic change. No other evidence of intracranial metastasis is definitively demonstrated.    ??  PET/CT 07/09/2021: Large lingular mass demonstrates increased tracer activity and is compatible with the known neoplasm. Hypermetabolic bilateral  supraclavicular, mediastinal, and left hilar lymphadenopathy. Hypermetabolic bone lesions concerning for metastatic disease.   ??  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  Gamma knife with Dr. Oletta Darter on 07/21/2021   ??  Initiated palliative  systemic therapy with Pembrolizumab on 07/27/2021        History of Present Illness:     Presents for follow up on therapy. States fatigue continues to be an issue. She is sitting in the recliner most of the day, resting. Many rest periods in the day. She walks up  and down the stairs once daily for exercise.       Appetite has been down, she is "bird" eating through the day. Very small amounts at meal time.     Reports hair loss.  Reports skin feeling like sandpaper.       She was previously seen by Dr. Laurena Bering for breast cancer.  She had a right lumpectomy 04/2013, radiation 06/2023, and anastrozole from 07/2013 to 07/2018.  She was discharged from his follow up 07/2018.      She is accompanied by her husband today.  She lives with her husband and son in [place].  She previously smoked 1ppd, quit 25 years before diagnosis.      Review of systems was obtained and pertinent findings reviewed above. Past medical history, social history, family history, medications, and allergies are located in the electronic medical record.        Physical Exam:     Visit Vitals  BP  102/62     Pulse  87     Temp  97.8 ??F (36.6 ??C)     Resp  16     Ht  5' 3"  (1.6 m)     Wt  128 lb (58.1 kg)     SpO2  98%        BMI  22.67 kg/m??           ECOG PS: 1   General: no distress   Eyes: anicteric sclerae   HENT: oropharynx clear   Neck: supple   Lymphatic: no cervical, supraclavicular, or inguinal adenopathy   Respiratory: normal respiratory effort   CV: no peripheral edema   GI: soft, nontender, nondistended, no masses   Skin: no rashes, no ecchymoses, no petechiae   Unstable gait        Results:          Lab Results         Component  Value  Date/Time            WBC  11.9 (H)  10/19/2021 01:07 PM       HGB  10.2 (L)  10/19/2021 01:07 PM       HCT  32.8 (L)  10/19/2021 01:07 PM       PLATELET  432 (H)  10/19/2021 01:07 PM       MCV  86.5  10/19/2021 01:07 PM            ABS. NEUTROPHILS  8.1 (H)  10/19/2021 01:07 PM          Lab Results          Component  Value  Date/Time            Sodium  139  10/19/2021 01:07 PM       Potassium  3.9  10/19/2021 01:07 PM       Chloride  105  10/19/2021 01:07 PM       CO2  27  10/19/2021 01:07 PM       Glucose  134 (H)  10/19/2021 01:07 PM       BUN  22 (H)  10/19/2021 01:07 PM       Creatinine  0.73  10/19/2021 01:07 PM       GFR est AA  >60  06/11/2021 10:14 AM       GFR est non-AA  >60  06/11/2021 10:14 AM       Calcium  9.3  10/19/2021 01:07 PM       Glucose (POC)  137 (H)  07/23/2021 12:58 PM            Creatinine (POC)  0.8  03/26/2013 01:52 PM          Lab Results         Component  Value  Date/Time            Bilirubin, total  0.3  10/19/2021 01:07 PM       ALT (SGPT)  30  10/19/2021 01:07 PM       Alk. phosphatase  75  10/19/2021 01:07 PM       Protein, total  7.5  10/19/2021 01:07 PM       Albumin  2.8 (L)  10/19/2021 01:07 PM            Globulin  4.7 (H)  10/19/2021 01:07 PM        CT C/A/P 10/06/2021:   Imaging findings are consistent with  interval response to therapy.   Diminished size of lymphadenopathy.   Diminished size of lingular mass.   No acute intraperitoneal/intrathoracic process is identified.         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  Imaging notes extensive disease within her left lung, bilateral supraclavicular and mediastinal nodes, bones, and brain.  Her cancer is  not curable and management is with palliative intent.      Due to high PDL1, she initiated therapy with single agent pembrolizumab.        Tolerating therapy with mild itching and increased fatigue. CT imaging reviewed in detail today with response to therapy. Will proceed with next cycle today as ordered.       She has elected against enrollment in the DIRECT study.        [employer] was unable to be run on sample.        2) Brain metastasis   Isolated metastasis on MRI.  She was seen by Dr. Oletta Darter  (neurosurgery) and underwent gamma knife on 10/5. MRI done at time of procedure with 2 new lesions, uncertain if cancer lesions vs infarcts.      Repeat MRI on 10/21/2020.       3) Cough   Improving. Taking cough syrup PRN.      4) H/o breast cancer   S/p lumpectomy, radiation, and endocrine therapy.      5) Weight loss   Dietician supportive phone call on 07/16/2021. Continue 2 boosts/daily. Stable on therapy.       6) Emotional Well Being   No psychosocial concerns identified today. Patient has adequate support.       7) Itching   Zyrtec daily, Benadryl PRN. Improved since last visit.       8) Hypotension   Seen by cardiology, had change in BP medications (reduced). Continue NS bolus day of therapy PRN.       9) Recurrent falls   Offered referral to PT for evaluation in the home. Will proceed with that referral.       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 Thyroid studies are normal. Seeing cardiology-likely hypotension contributing. ECHO normal.  Obtain random cortisol level today.      Discussed therapy may be contributing to symptoms, it is difficult to know to what degree. Reviewed option of holding therapy to see if symptoms improve, she does not wish to stop treatment.       11) Left shoulder/flank pain   Imaging upcoming. Hydrocodone PRN. Has constipation at baseline, monitor this closely.         Plan:        ??  Proceed today with C5 Pembrolizumab (281m) given every 3 weeks   ??  Labs: CBC, CMP prior to each cycle, TSH every 6 weeks   ??  NS bolus today-5025m  ??  Hydrocodone PRN   ??  MRI brain 10/21/2021   ??  Follow up every 3 weeks      I personally saw and evaluated the patient and performed the key components of medical decision making.  The history, physical exam, and documentation were performed by MeLucrezia EuropeNP.  I reviewed and verified the above documentation and modified it  as needed. She is tolerating systemic therapy with manageable toxicity, so we will proceed with treatment.   CT  looks good, nice response to therapy.  Significant fatigue which we discussed at length, does not appear to be related to her cancer.  Perhaps  due to immunotherapy.  Checking cortisol level today.  She is not interested in stopping therapy.  Discussed importance of exercise, nutrition, PT.         Signed By:  Heloise Purpura, MD

## 2021-10-19 NOTE — Progress Notes (Signed)
 Dawn Green is a 81 y.o. female follow up for lung cancer.    1. Have you been to the ER, urgent care clinic since your last visit?  Hospitalized since your last visit?no     2. Have you seen or consulted any other health care providers outside of the [facility] since your last visit?  Include any pap smears or colon screening. No      Vitals 10/19/2021   Blood Pressure 102/62   Pulse 87   Temp 97.8   Resp 16   Height 5' 3   Weight 128 lb 3.2 oz   SpO2 98   BSA 1.61 m2   BMI 22.71 kg/m2

## 2021-10-21 ENCOUNTER — Inpatient Hospital Stay: Admit: 2021-10-21 | Payer: MEDICARE | Attending: Neurological Surgery | Primary: Family Medicine

## 2021-10-21 DIAGNOSIS — C50911 Malignant neoplasm of unspecified site of right female breast: Secondary | ICD-10-CM

## 2021-10-21 MED ORDER — GADOTERIDOL 279.3 MG/ML INTRAVENOUS SOLUTION
279.3 mg/mL | Freq: Once | INTRAVENOUS | Status: AC
Start: 2021-10-21 — End: 2021-10-21
  Administered 2021-10-21: 19:00:00 via INTRAVENOUS

## 2021-10-21 MED FILL — PROHANCE 279.3 MG/ML INTRAVENOUS SOLUTION: 279.3 mg/mL | INTRAVENOUS | Qty: 20

## 2021-10-29 ENCOUNTER — Ambulatory Visit: Admit: 2021-10-29 | Discharge: 2021-10-29 | Payer: MEDICARE | Attending: Specialist | Primary: Family Medicine

## 2021-10-29 ENCOUNTER — Ambulatory Visit: Attending: Specialist | Primary: Family Medicine

## 2021-10-29 DIAGNOSIS — I10 Essential (primary) hypertension: Secondary | ICD-10-CM

## 2021-10-29 NOTE — Progress Notes (Signed)
 Chief Complaint   Patient presents with    Follow-up     8 wk      Vitals:    10/29/21 1437   BP: 108/70   BP 1 Location: Left upper arm   BP Patient Position: Sitting   Pulse: 95   Height: 5' 3 (1.6 m)   Weight: 125 lb (56.7 kg)   SpO2: 100%       Chest pain denied     SOB denied     Palpitations denied     Swelling in hands/feet denied     Dizziness denied     Recent hospital stays denied     Refills denied

## 2021-10-29 NOTE — Progress Notes (Signed)
Progress Notes by Vennie Homans, MD at 10/29/21 1440                Author: Vennie Homans, MD  Service: --  Author Type: Physician       Filed: 10/29/21 1503  Encounter Date: 10/29/2021  Status: Signed          Editor: Vennie Homans, MD (Physician)                                       CARDIOLOGY OFFICE NOTE      Dawn How. Dawn Bolyard, MD, [address], [place] [address]   Phone [phone]; Fax [phone]   Mobile [phone]   Voice Mail [phone]      Primary care: Esau Grew, DO          ATTENTION:    This medical record was transcribed using an electronic medical records/speech recognition system.  Although proofread, it may and can contain electronic, spelling and other errors.  Corrections may be executed at a later time.  Please feel free to  contact us for any clarifications as needed.                   Dawn Green is a 81 y.o. female with  referred for possible embolic events noted on a head CT scan            Cardiac risk factors: dyslipidemia, diabetes mellitus, sedentary life style, hypertension, post-menopausal   I have personally obtained the history from the patient.        HISTORY OF PRESENTING ILLNESS     From previous note:      [name]  81 y.o. is very pleasant lady who comes in with her husband is a patient of mine.  She is extremely sweet and has a history of both breast cancer and lung cancer.  She is currently being treated for the lung cancer.  She did have  a gamma knife performed secondary to lesions on her brain and at this time of the CT there was some suggestion that she had embolic events and were curious whether or not there was a PFO or possible atrial fibrillation and whether or not anticoagulation  was indicated.  Further work-up is indicated.         CT Chest 06/11/2021: Large lingular mass, compatible with neoplasm. Bulky mediastinal and left hilar adenopathy, compatible with metastatic  disease.tage IVB (cT3 cN3 M1c)  Non-small cell lung cancer.Gamma knife with Dr. Oletta Darter on 07/21/2021.  She has a prior history of breast cancer  with a right lumpectomy on 04/2013 with radiation.        Today she returns for follow-up of all of her cardiac testing.  She wore an event monitor for 2 weeks with no evidence of atrial fibrillation or any arrhythmias.  She also had an echo and a bubble study of which heart function is normal and it was a negative  bubble study.         ACTIVE PROBLEM LIST          Patient Active Problem List           Diagnosis  Date Noted         ?  Encounter for antineoplastic immunotherapy  07/16/2021     ?  Metastatic non-small  cell lung cancer ([place])  07/15/2021     ?  Controlled type 2 diabetes mellitus without complication, without long-term current use of insulin ([place])  07/29/2016     ?  Elevated fasting glucose  07/28/2016     ?  Essential hypertension  01/05/2016     ?  Mixed hyperlipidemia  01/05/2016     ?  Hypovitaminosis D  10/05/2015     ?  Postmenopausal  05/06/2013         ?  Breast cancer, right ([place])  03/18/2013                  PAST MEDICAL HISTORY          Past Medical History:        Diagnosis  Date         ?  Breast CA ([place])  2014          Right - Lumpectomy          ?  Diabetes ([place])       ?  Glaucoma       ?  Hx of mammogram  02/05/2016          Negative per pt. Sees Dr. Laurena Bering          ?  Hyperlipemia       ?  Hypertension       ?  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  Other ill-defined conditions(799.89)  2016          Mild URI x 4 months (allergies)          ?  Radiation therapy complication  9767     ?  Routine Papanicolaou smear  1996          Negative per pt.          ?  Skin cancer            Basal cell on left side of nose and upper lip                  PAST SURGICAL HISTORY          Past Surgical History:         Procedure  Laterality  Date          ?  HX BREAST BIOPSY  Left            x 2 negative biopsies           ?  HX BREAST LUMPECTOMY  Right  2014      ?  HX CATARACT REMOVAL  Bilateral            w/ IOL implants          ?  HX TAH AND BSO    1997          for rapidly growing fibroid ([facility]) - benign.  Dr. Oran Rein.          ?  [place]     ?  IR INSERT TUNL CVC W PORT OVER 5 YEARS    07/23/2021     ?  PR UNLISTED PROCEDURE BREAST  Right  2014          Right breast lumpectomy  ALLERGIES          Allergies        Allergen  Reactions         ?  Metformin  Nausea Only             Stomach upset                 FAMILY HISTORY          Family History         Problem  Relation  Age of Onset          ?  Cancer  Mother  9              Ovarian - Passed away in 01/03/1965          ?  Ovarian Cancer  Mother  32     ?  Other  Son  47              Pancreatitis           ?  Cancer  Paternal Aunt  68              Pancreatic      negative for cardiac disease            SOCIAL HISTORY          Social History          Socioeconomic History         ?  Marital status:  MARRIED       Tobacco Use         ?  Smoking status:  Former              Packs/day:  1.00         Years:  30.00         Pack years:  30.00         Types:  Cigarettes         Quit date:  04/17/1996         Years since quitting:  25.5         ?  Smokeless tobacco:  Never        ?  Tobacco comments:             Never used vapor or e-cigs        Substance and Sexual Activity         ?  Alcohol use:  Yes              Alcohol/week:  11.7 standard drinks         Types:  14 Standard drinks or equivalent per week             Comment: 1 gin and tonics per day         ?  Drug use:  No     ?  Sexual activity:  Yes              Partners:  Male              Birth control/protection:  Surgical          Social Determinants of Health          Financial Resource Strain: Unknown        ?  Difficulty of Paying Living Expenses: Patient refused       Food Insecurity: Unknown        ?  Worried About [employer] of  Food in the Last Year: Patient refused        ?  Ran Out of Food in the Last Year: Patient refused                 MEDICATIONS          Current Outpatient Medications        Medication  Sig         ?  lidocaine-prilocaine (EMLA) topical cream  Apply  to affected area as needed for Pain (Apply 30-60 min. prior to having your port accessed).     ?  olmesartan (BENICAR) 20 mg tablet  Take 1 Tablet by mouth in the morning.     ?  amitriptyline (ELAVIL) 10 mg tablet  Take 1 Tablet by mouth nightly.     ?  alendronate (FOSAMAX) 35 mg tablet  TAKE 1 TABLET EVERY 7 DAYS     ?  atorvastatin (LIPITOR) 40 mg tablet  TAKE 1 TABLET DAILY BEFORE BREAKFAST     ?  brimonidine-timoloL (COMBIGAN) 0.2-0.5 % drop ophthalmic solution  Administer 1 Drop to both eyes every twelve (12) hours.         ?  aspirin 81 mg chewable tablet  Take 81 mg by mouth daily.          No current facility-administered medications for this visit.           I have reviewed the nurses notes, vitals, problem list, allergy list, medical history, family, social history and medications.            REVIEW OF SYMPTOMS      As per HPI   General: Pt denies excessive weight gain or loss. Pt is able to conduct ADL's   HEENT: Denies blurred vision, headaches, hearing loss, epistaxis and difficulty swallowing.   Respiratory: Denies cough, congestion, shortness of breath, DOE, wheezing or stridor.   Cardiovascular: Denies precordial pain, palpitations, edema or PND   Gastrointestinal: Denies poor appetite, indigestion, abdominal pain or blood in stool   Genitourinary: Denies hematuria, dysuria, increased urinary frequency   Musculoskeletal: Denies joint pain or swelling from muscles or joints   Neurologic: Denies tremor, paresthesias, headache, or sensory motor disturbance   Psychiatric: Denies confusion, insomnia, depression   Integumentray: Denies rash, itching or ulcers.   Hematologic: Denies easy bruising, bleeding         PHYSICAL EXAMINATION           Vitals:          10/29/21 1437        BP:  108/70     Pulse:  95     SpO2:  100%     Weight:  125 lb (56.7 kg)         Height:  5' 3"  (1.6 m)        General: Well developed, in no acute distress.  Very pleasant lady   HEENT: No jaundice   Neck: Supple, no stiffness   Heart:  Normal S1/S2 negative S3 or S4. Regular, no murmur, gallop or rub, no jugular venous distention   Respiratory: Clear bilaterally x 4, no wheezing or rales   Extremities:  No edema, normal cap refill, no cyanosis.   Musculoskeletal: No clubbing, no deformities   Neuro: A&Ox3, speech clear, gait stable, cooperative, no focal neurologic deficits   Skin: Skin color is normal. No rashes or lesions. Non diaphoretic, moist.   Vascular: 2+ pulses symmetric in all extremities   Abdomen:  Soft, non-tender, bowel sounds are active.          EKG: Date: (09/03/21): Sinus rhythm with incomplete right bundle branch block         DIAGNOSTIC DATA        1. Echo   10/06/21- EF 60 - 65%,Tricuspid AV,  Interatrial Septum: Agitated saline study was negative with and without provocation.      2. Lipids   04/21/21- TC 154, HDL 33, LDL 62, TG 295               LABORATORY DATA             Lab Results         Component  Value  Date/Time            WBC  11.9 (H)  10/19/2021 01:07 PM       HGB  10.2 (L)  10/19/2021 01:07 PM       HCT  32.8 (L)  10/19/2021 01:07 PM       PLATELET  432 (H)  10/19/2021 01:07 PM            MCV  86.5  10/19/2021 01:07 PM           Lab Results         Component  Value  Date/Time            Sodium  139  10/19/2021 01:07 PM       Potassium  3.9  10/19/2021 01:07 PM       Chloride  105  10/19/2021 01:07 PM       CO2  27  10/19/2021 01:07 PM       Anion gap  7  10/19/2021 01:07 PM       Glucose  134 (H)  10/19/2021 01:07 PM       BUN  22 (H)  10/19/2021 01:07 PM       Creatinine  0.73  10/19/2021 01:07 PM       BUN/Creatinine ratio  30 (H)  10/19/2021 01:07 PM       GFR est AA  >60  06/11/2021 10:14 AM       GFR est non-AA  >60  06/11/2021 10:14 AM       Calcium  9.3  10/19/2021 01:07 PM       Bilirubin, total  0.3  10/19/2021 01:07 PM       Alk. phosphatase  75   10/19/2021 01:07 PM       Protein, total  7.5  10/19/2021 01:07 PM       Albumin  2.8 (L)  10/19/2021 01:07 PM       Globulin  4.7 (H)  10/19/2021 01:07 PM       A-G Ratio  0.6 (L)  10/19/2021 01:07 PM            ALT (SGPT)  30  10/19/2021 01:07 PM                        ICD-10-CM  ICD-9-CM          1.  Essential hypertension   I10  401.9     2.  Mixed hyperlipidemia   E78.2  272.2     3.  Controlled type 2 diabetes mellitus without complication, without long-term current use of insulin ([facility])   E11.9  250.00          4.  Atrial fibrillation,  unspecified type ([place])   I48.91  427.31              ASSESSMENT/RECOMMENDATIONS:.         1.  metastatic disease.stage IVB (cT3 cN3 M1c) Non-small cell lung cancer   - she was seen by Dr.Sani at [facility] who felt that she potentially had  embolic events on her CT scan of her head when he did her gamma knife and was concerned that maybe she had either A. fib  or a PFO   -Demonstrated a negative bubble study   -She did report an event monitor rhythm per and frequent PVCs and frequent PSVT sees no documentation of atrial fibrillation or atrial flutter.  She did have occasional periods of sinus tachycardia up to 120 beats a minute.   2.  Dyslipidemia   -Last LDL 62 on 04/21/2021 on atorvastatin 40 mg a day   3.  Hypertension   -Blood pressure is 108/70   -She is currently taking 10 benicar   -Blood pressure is low and she is somewhat fatigued which may be also from her lung cancer but he is reasonable to hold her Benicar for the next 3 days we will monitor her blood pressure should a trend nothing or to restart and they will call me on Tuesday  let me know what her blood pressure readings are and if she feels better on no Benicar   4.  T2DM   -Hemoglobin A1c was 6.1   Follow-up with me in 6 months      No orders of the defined types were placed in this encounter.         We discussed the expected course, resolution and complications of the diagnosis(es) in detail.  Medication  risks, benefits, costs, interactions, and alternatives were discussed as indicated.  I advised him to contact the office if his condition worsens,  changes or fails to improve as anticipated. He expressed understanding with the diagnosis(es) and plan                Follow-up and Dispositions    ??       Return in about 6 months (around 04/28/2022).                    I have discussed the diagnosis with  Dawn Green and the intended plan as seen in  the above orders.  Questions were answered concerning future plans.  I have discussed medication side effects and warnings with the patient as well.      Thank you,  Esau Grew, DO for involving me in the care of  Dawn Green. Please do not hesitate to contact me for further questions/concerns.             Mandel Seiden A.Riku Buttery,  MD, [facility]       [address] [place], [place]      [place], [place]       [phone] / [phone] Fax

## 2021-11-01 ENCOUNTER — Encounter

## 2021-11-04 MED FILL — KEYTRUDA 25 MG/ML INTRAVENOUS SOLUTION: 25 mg/mL | INTRAVENOUS | Qty: 8

## 2021-11-09 ENCOUNTER — Inpatient Hospital Stay: Admit: 2021-11-09 | Payer: MEDICARE | Primary: Family Medicine

## 2021-11-09 ENCOUNTER — Ambulatory Visit
Admit: 2021-11-09 | Discharge: 2021-11-09 | Payer: MEDICARE | Attending: Hematology & Oncology | Primary: Family Medicine

## 2021-11-09 ENCOUNTER — Encounter

## 2021-11-09 DIAGNOSIS — Z5112 Encounter for antineoplastic immunotherapy: Secondary | ICD-10-CM

## 2021-11-09 DIAGNOSIS — C3490 Malignant neoplasm of unspecified part of unspecified bronchus or lung: Secondary | ICD-10-CM

## 2021-11-09 LAB — CBC WITH AUTO DIFFERENTIAL
Basophils %: 1 % (ref 0–1)
Basophils Absolute: 0.1 10*3/uL (ref 0.0–0.1)
Eosinophils %: 6 % (ref 0–7)
Eosinophils Absolute: 0.8 10*3/uL — ABNORMAL HIGH (ref 0.0–0.4)
Granulocyte Absolute Count: 0.1 10*3/uL — ABNORMAL HIGH (ref 0.00–0.04)
Hematocrit: 32.2 % — ABNORMAL LOW (ref 35.0–47.0)
Hemoglobin: 10.2 g/dL — ABNORMAL LOW (ref 11.5–16.0)
Immature Granulocytes: 1 % — ABNORMAL HIGH (ref 0.0–0.5)
Lymphocytes %: 10 % — ABNORMAL LOW (ref 12–49)
Lymphocytes Absolute: 1.3 10*3/uL (ref 0.8–3.5)
MCH: 27.1 PG (ref 26.0–34.0)
MCHC: 31.7 g/dL (ref 30.0–36.5)
MCV: 85.6 FL (ref 80.0–99.0)
MPV: 9.3 FL (ref 8.9–12.9)
Monocytes %: 7 % (ref 5–13)
Monocytes Absolute: 0.9 10*3/uL (ref 0.0–1.0)
NRBC Absolute: 0 10*3/uL (ref 0.00–0.01)
Neutrophils %: 75 % (ref 32–75)
Neutrophils Absolute: 9.1 10*3/uL — ABNORMAL HIGH (ref 1.8–8.0)
Nucleated RBCs: 0 PER 100 WBC
Platelets: 410 10*3/uL — ABNORMAL HIGH (ref 150–400)
RBC: 3.76 M/uL — ABNORMAL LOW (ref 3.80–5.20)
RDW: 14.4 % (ref 11.5–14.5)
WBC: 12.3 10*3/uL — ABNORMAL HIGH (ref 3.6–11.0)

## 2021-11-09 LAB — COMPREHENSIVE METABOLIC PANEL
ALT: 24 U/L (ref 12–78)
AST: 15 U/L (ref 15–37)
Albumin/Globulin Ratio: 0.6 — ABNORMAL LOW (ref 1.1–2.2)
Albumin: 2.8 g/dL — ABNORMAL LOW (ref 3.5–5.0)
Alkaline Phosphatase: 83 U/L (ref 45–117)
Anion Gap: 5 mmol/L (ref 5–15)
BUN: 18 MG/DL (ref 6–20)
Bun/Cre Ratio: 26 — ABNORMAL HIGH (ref 12–20)
CO2: 27 mmol/L (ref 21–32)
Calcium: 8.8 MG/DL (ref 8.5–10.1)
Chloride: 105 mmol/L (ref 97–108)
Creatinine: 0.68 MG/DL (ref 0.55–1.02)
ESTIMATED GLOMERULAR FILTRATION RATE: >60 mL/min/{1.73_m2} (ref >60)
Globulin: 4.4 g/dL — ABNORMAL HIGH (ref 2.0–4.0)
Glucose: 181 mg/dL — ABNORMAL HIGH (ref 65–100)
Potassium: 3.6 mmol/L (ref 3.5–5.1)
Sodium: 137 mmol/L (ref 136–145)
Total Bilirubin: 0.5 MG/DL (ref 0.2–1.0)
Total Protein: 7.2 g/dL (ref 6.4–8.2)

## 2021-11-09 LAB — CBC WITH AUTOMATED DIFF
ABS. BASOPHILS: 0.1 10*3/uL (ref 0.0–0.1)
ABS. EOSINOPHILS: 0.8 10*3/uL — ABNORMAL HIGH (ref 0.0–0.4)
ABS. IMM. GRANS.: 0.1 10*3/uL — ABNORMAL HIGH (ref 0.00–0.04)
ABS. LYMPHOCYTES: 1.3 10*3/uL (ref 0.8–3.5)
ABS. MONOCYTES: 0.9 10*3/uL (ref 0.0–1.0)
ABS. NEUTROPHILS: 9.1 10*3/uL — ABNORMAL HIGH (ref 1.8–8.0)
ABSOLUTE NRBC: 0 10*3/uL (ref 0.00–0.01)
BASOPHILS: 1 % (ref 0–1)
EOSINOPHILS: 6 % (ref 0–7)
HCT: 32.2 % — ABNORMAL LOW (ref 35.0–47.0)
HGB: 10.2 g/dL — ABNORMAL LOW (ref 11.5–16.0)
IMMATURE GRANULOCYTES: 1 % — ABNORMAL HIGH (ref 0.0–0.5)
LYMPHOCYTES: 10 % — ABNORMAL LOW (ref 12–49)
MCH: 27.1 PG (ref 26.0–34.0)
MCHC: 31.7 g/dL (ref 30.0–36.5)
MCV: 85.6 FL (ref 80.0–99.0)
MONOCYTES: 7 % (ref 5–13)
MPV: 9.3 FL (ref 8.9–12.9)
NEUTROPHILS: 75 % (ref 32–75)
NRBC: 0 PER 100 WBC
PLATELET: 410 10*3/uL — ABNORMAL HIGH (ref 150–400)
RBC: 3.76 M/uL — ABNORMAL LOW (ref 3.80–5.20)
RDW: 14.4 % (ref 11.5–14.5)
WBC: 12.3 10*3/uL — ABNORMAL HIGH (ref 3.6–11.0)

## 2021-11-09 LAB — METABOLIC PANEL, COMPREHENSIVE
A-G Ratio: 0.6 — ABNORMAL LOW (ref 1.1–2.2)
ALT (SGPT): 24 U/L (ref 12–78)
AST (SGOT): 15 U/L (ref 15–37)
Albumin: 2.8 g/dL — ABNORMAL LOW (ref 3.5–5.0)
Alk. phosphatase: 83 U/L (ref 45–117)
Anion gap: 5 mmol/L (ref 5–15)
BUN/Creatinine ratio: 26 — ABNORMAL HIGH (ref 12–20)
BUN: 18 MG/DL (ref 6–20)
Bilirubin, total: 0.5 MG/DL (ref 0.2–1.0)
CO2: 27 mmol/L (ref 21–32)
Calcium: 8.8 MG/DL (ref 8.5–10.1)
Chloride: 105 mmol/L (ref 97–108)
Creatinine: 0.68 MG/DL (ref 0.55–1.02)
Globulin: 4.4 g/dL — ABNORMAL HIGH (ref 2.0–4.0)
Glucose: 181 mg/dL — ABNORMAL HIGH (ref 65–100)
Potassium: 3.6 mmol/L (ref 3.5–5.1)
Protein, total: 7.2 g/dL (ref 6.4–8.2)
Sodium: 137 mmol/L (ref 136–145)
eGFR: >60 mL/min/{1.73_m2} (ref >60)

## 2021-11-09 MED ORDER — SODIUM CHLORIDE 0.9 % IV
25 mg/mL | Freq: Once | INTRAVENOUS | Status: AC
Start: 2021-11-09 — End: 2021-11-09
  Administered 2021-11-09: 20:00:00 via INTRAVENOUS

## 2021-11-09 MED ORDER — HEPARIN, PORCINE (PF) 100 UNIT/ML IV SYRINGE
100 unit/mL | INTRAVENOUS | Status: DC | PRN
Start: 2021-11-09 — End: 2021-11-10

## 2021-11-09 MED ORDER — SODIUM CHLORIDE 0.9% BOLUS IV
0.9 % | Freq: Once | INTRAVENOUS | Status: DC | PRN
Start: 2021-11-09 — End: 2021-11-10

## 2021-11-09 MED ORDER — SODIUM CHLORIDE 0.9 % IV
INTRAVENOUS | Status: DC | PRN
Start: 2021-11-09 — End: 2021-11-10

## 2021-11-09 MED ORDER — SODIUM CHLORIDE 0.9 % INJECTION
INTRAMUSCULAR | Status: DC | PRN
Start: 2021-11-09 — End: 2021-11-10

## 2021-11-09 MED ORDER — SODIUM CHLORIDE 0.9 % IJ SYRG
INTRAMUSCULAR | Status: DC | PRN
Start: 2021-11-09 — End: 2021-11-10

## 2021-11-09 MED FILL — SODIUM CHLORIDE 0.9 % IV: INTRAVENOUS | Qty: 1000

## 2021-11-09 MED FILL — SODIUM CHLORIDE 0.9 % IV: INTRAVENOUS | Qty: 500

## 2021-11-09 MED FILL — BD POSIFLUSH NORMAL SALINE 0.9 % INJECTION SYRINGE: INTRAMUSCULAR | Qty: 40

## 2021-11-09 NOTE — Progress Notes (Signed)
 [facility] Progress Note    Date: November 09, 2021    Name: Dawn Green    MRN: [id number]         DOB: 1941-05-04    Dawn Green Arrived ambulatory and in no distress for C6D1 of Keytruda Regimen.  Assessment was completed by Macario RN, no acute issues at this time, no new complaints voiced.  Right chest wall port accessed without difficulty, labs drawn & sent for processing.    Chemotherapy Flowsheet 11/09/2021   Cycle C6D1   Date 11/09/2021   Drug / Regimen Keytruda   Pre Hydration -   Notes -       Patient proceed to appointment with Dr. Junie .    Dawn Green's vitals were reviewed.  Visit Vitals  BP 107/65   Pulse 90   Temp 97.7 F (36.5 C)   Resp 18   Ht 5' 3 (1.6 m)   Wt 59.4 kg (130 lb 14.4 oz)   SpO2 97%   BMI 23.19 kg/m       Lab results were obtained and reviewed.  Recent Results (from the past 12 hour(s))   CBC WITH AUTOMATED DIFF    Collection Time: 11/09/21  1:01 PM   Result Value Ref Range    WBC 12.3 (H) 3.6 - 11.0 K/uL    RBC 3.76 (L) 3.80 - 5.20 M/uL    HGB 10.2 (L) 11.5 - 16.0 g/dL    HCT 67.7 (L) 64.9 - 47.0 %    MCV 85.6 80.0 - 99.0 FL    MCH 27.1 26.0 - 34.0 PG    MCHC 31.7 30.0 - 36.5 g/dL    RDW 85.5 88.4 - 85.4 %    PLATELET 410 (H) 150 - 400 K/uL    MPV 9.3 8.9 - 12.9 FL    NRBC 0.0 0 PER 100 WBC    ABSOLUTE NRBC 0.00 0.00 - 0.01 K/uL    NEUTROPHILS 75 32 - 75 %    LYMPHOCYTES 10 (L) 12 - 49 %    MONOCYTES 7 5 - 13 %    EOSINOPHILS 6 0 - 7 %    BASOPHILS 1 0 - 1 %    IMMATURE GRANULOCYTES 1 (H) 0.0 - 0.5 %    ABS. NEUTROPHILS 9.1 (H) 1.8 - 8.0 K/UL    ABS. LYMPHOCYTES 1.3 0.8 - 3.5 K/UL    ABS. MONOCYTES 0.9 0.0 - 1.0 K/UL    ABS. EOSINOPHILS 0.8 (H) 0.0 - 0.4 K/UL    ABS. BASOPHILS 0.1 0.0 - 0.1 K/UL    ABS. IMM. GRANS. 0.1 (H) 0.00 - 0.04 K/UL    DF AUTOMATED     METABOLIC PANEL, COMPREHENSIVE    Collection Time: 11/09/21  1:01 PM   Result Value Ref Range    Sodium 137 136 - 145 mmol/L    Potassium 3.6 3.5 - 5.1 mmol/L    Chloride 105 97 - 108 mmol/L    CO2 27 21 - 32 mmol/L    Anion gap 5 5 - 15  mmol/L    Glucose 181 (H) 65 - 100 mg/dL    BUN 18 6 - 20 MG/DL    Creatinine 9.31 9.44 - 1.02 MG/DL    BUN/Creatinine ratio 26 (H) 12 - 20      eGFR >60 >60 ml/min/1.52m2    Calcium 8.8 8.5 - 10.1 MG/DL    Bilirubin, total 0.5 0.2 - 1.0 MG/DL    ALT (SGPT) 24 12 -  78 U/L    AST (SGOT) 15 15 - 37 U/L    Alk. phosphatase 83 45 - 117 U/L    Protein, total 7.2 6.4 - 8.2 g/dL    Albumin 2.8 (L) 3.5 - 5.0 g/dL    Globulin 4.4 (H) 2.0 - 4.0 g/dL    A-G Ratio 0.6 (L) 1.1 - 2.2         Medications:  Medications Administered       pembrolizumab (KEYTRUDA) 200 mg in 0.9% sodium chloride 100 mL, overfill volume 10 mL IVPB       Admin Date  11/09/2021 Action  New Bag Dose  200 mg Rate  236 mL/hr Route  IntraVENous Administered By  Berwyn Mt, RN                         Two nurses verified prior to administering: Drug name, drug dose, infusion volume or drug volume when prepared in a syringe, rate of administration, route of administration,  expiration dates and/or times, appearance and physical integrity of the drugs, rate set on infusion pump, when used sequencing of drug administration.           Dawn Green tolerated treatment well and [facility].   Port de-accessed, flushed & heparinized per protocol. She is to return on February 14 at 1300 for her next appointment.    M[place], RN  November 09, 2021

## 2021-11-09 NOTE — Progress Notes (Signed)
 Dawn Green is a 81 y.o. female follow up for lung cancer.    1. Have you been to the ER, urgent care clinic since your last visit?  Hospitalized since your last visit?no     2. Have you seen or consulted any other health care providers outside of the [facility] since your last visit?  Include any pap smears or colon screening. No    Vitals 11/09/2021   Blood Pressure 111/56   Pulse 90   Temp 97.7   Resp 18   Height 5' 3   Weight 130 lb 14.4 oz   SpO2 97   BSA 1.62 m2   BMI 23.19 kg/m2

## 2021-11-09 NOTE — Progress Notes (Signed)
Progress  Notes by RaddinReuel Derby, MD at 11/09/21 1315                Author: Heloise Purpura, MD  Service: --  Author Type: Physician       Filed: 11/09/21 1559  Encounter Date: 11/09/2021  Status: Signed          Editor: Liliana Dang, Reuel Derby, MD (Physician)                       [place] [facility]   [address] [place] [address]   W: [phone]  F: [phone]           Reason for Visit:     Dawn Green is a 81 y.o. female who is seen in follow up for evaluation of lung cancer.        Hematology/Oncology Treatment History:     ??  CT Chest 06/11/2021: Large lingular mass, compatible with neoplasm. Bulky mediastinal  and left hilar adenopathy, compatible with metastatic disease.  Small left pleural effusion, likely malignant.   ??  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  CT A/P 07/06/2021: Incompletely visualized 6.4 cm x 4.6 cm lingular soft tissue mass. Trace left pleural fluid.   ??  MRI Brain 07/06/2021: Nonenhancing/minimally enhancing ring lesion of the (hemorrhagic/melanotic) left parietal mass lesion measures 11 x 15 x 16 mm in size. Ring-enhancing lesion with minimal associated  susceptibility. There is no significant midline shift or mass effect. There is no significant associated vasogenic edema. Despite its atypical appearance most likely represents a metastasis, not fully delineated. Mild cerebral atrophy and chronic microvascular  ischemic change. No other evidence of intracranial metastasis is definitively demonstrated.    ??  PET/CT 07/09/2021: Large lingular mass demonstrates increased tracer activity and is compatible with the known neoplasm. Hypermetabolic bilateral  supraclavicular, mediastinal, and left hilar lymphadenopathy. Hypermetabolic bone lesions concerning for metastatic disease.   ??  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  Gamma knife with Dr. Oletta Darter on 07/21/2021   ??  Initiated palliative  systemic therapy with Pembrolizumab on 07/27/2021        History of Present Illness:     Presents for follow up on therapy. Mild constipation. Moderate fatigue persists.      Left shoulder pain persists. States this has been present for the last few months.       Cough intermittently. This is chronic since before treatment initiation.       She was previously seen by Dr. Laurena Bering for breast cancer.  She had a right lumpectomy 04/2013, radiation 06/2023, and anastrozole from 07/2013 to 07/2018.  She was discharged from his follow up 07/2018.      She is accompanied by her husband today.  She lives with her husband and son in [place].  She previously smoked 1ppd, quit 25 years before diagnosis.      Review of systems was obtained and pertinent findings reviewed above. Past medical history, social history, family history, medications, and allergies are located in the electronic medical record.        Physical Exam:     Visit Vitals      BP  (!) (P) 111/56     Pulse  (P) 90     Temp  (P) 97.7 ??F (36.5 ??C)     Resp  (P)  18     Ht  (P) 5' 3"  (1.6 m)     Wt  (P) 130 lb (59 kg)     SpO2  (P) 97%        BMI  (P) 23.03 kg/m??           ECOG PS: 1   General: no distress   Eyes: anicteric sclerae   HENT: oropharynx clear   Neck: supple   Lymphatic: no cervical, supraclavicular, or inguinal adenopathy   Respiratory: normal respiratory effort   CV: no peripheral edema   GI: soft, nontender, nondistended, no masses   Skin: no rashes, no ecchymoses, no petechiae   Unstable gait        Results:          Lab Results         Component  Value  Date/Time            WBC  12.3 (H)  11/09/2021 01:01 PM       HGB  10.2 (L)  11/09/2021 01:01 PM       HCT  32.2 (L)  11/09/2021 01:01 PM       PLATELET  410 (H)  11/09/2021 01:01 PM       MCV  85.6  11/09/2021 01:01 PM            ABS. NEUTROPHILS  9.1 (H)  11/09/2021 01:01 PM          Lab Results         Component  Value  Date/Time            Sodium  137  11/09/2021 01:01 PM       Potassium  3.6   11/09/2021 01:01 PM       Chloride  105  11/09/2021 01:01 PM       CO2  27  11/09/2021 01:01 PM       Glucose  181 (H)  11/09/2021 01:01 PM       BUN  18  11/09/2021 01:01 PM       Creatinine  0.68  11/09/2021 01:01 PM       GFR est AA  >60  06/11/2021 10:14 AM       GFR est non-AA  >60  06/11/2021 10:14 AM       Calcium  8.8  11/09/2021 01:01 PM       Glucose (POC)  137 (H)  07/23/2021 12:58 PM            Creatinine (POC)  0.8  03/26/2013 01:52 PM          Lab Results         Component  Value  Date/Time            Bilirubin, total  0.5  11/09/2021 01:01 PM       ALT (SGPT)  24  11/09/2021 01:01 PM       Alk. phosphatase  83  11/09/2021 01:01 PM       Protein, total  7.2  11/09/2021 01:01 PM       Albumin  2.8 (L)  11/09/2021 01:01 PM            Globulin  4.4 (H)  11/09/2021 01:01 PM        CT C/A/P 10/06/2021:   Imaging findings are consistent with interval response to therapy.   Diminished size of lymphadenopathy.   Diminished size of lingular mass.   No acute intraperitoneal/intrathoracic process  is identified.         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  Imaging notes extensive disease within her left lung, bilateral supraclavicular and mediastinal nodes, bones, and brain.  Her cancer is  not curable and management is with palliative intent.      Due to high PDL1, she initiated therapy with single agent pembrolizumab.        Tolerating therapy with mild itching and increased fatigue. Will proceed with next cycle today as ordered.       After C8 will discuss moving to every 6 week dosing.       She has elected against enrollment in the DIRECT study.        FoundationOne was unable to be run on sample.        2) Brain metastasis   Isolated metastasis on MRI.  She was seen by Dr. Oletta Darter (neurosurgery) and underwent gamma knife on 10/5. MRI done at time of procedure with 2 new lesions, uncertain if  cancer lesions vs infarcts.      Repeat MRI on 10/21/2020 stable.       3) Cough   Improving. Taking cough syrup PRN.      4) H/o breast cancer   S/p lumpectomy, radiation, and endocrine therapy.      5) Weight loss   Dietician supportive phone call on 07/16/2021. Continue 2 boosts/daily. Stable on therapy.       6) Emotional Well Being   No psychosocial concerns identified today. Patient has adequate support.       7) Itching   Zyrtec daily, Benadryl PRN. Improved since last visit.       8) Hypotension   Seen by cardiology, had change in BP medications (reduced). They attempted to stop it, but BP went too high. Continue NS bolus day of therapy PRN.       9) Recurrent falls   Offered referral to PT for evaluation in the home. Will proceed with that referral.       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 Thyroid studies are normal. Cortisol normal. Seeing cardiology-likely hypotension contributing. ECHO normal.       Discussed therapy may be contributing to symptoms, it is difficult to know to what degree. Reviewed option of holding therapy to see if symptoms improve, she does not wish to stop treatment.       Will refer to palliative medicine to see if they can provide some assistance with with symptom management.       11) Left shoulder/flank pain   Obtain xray of left shoulder and humerus. Hydrocodone PRN. Has constipation at baseline, monitor this closely. Obtain bone scan with next imaging.         Plan:        ??  Proceed today with C6 Pembrolizumab (254m) given every 3 weeks   ??  Labs: CBC, CMP prior to each cycle, TSH every 6 weeks   ??  NS bolus 5020mPRN   ??  Hydrocodone PRN   ??  Left shoulder xray   ??  Refer to palliative medicine   ??  Follow up every 3 weeks      I personally saw and evaluated the patient and performed the key components of medical decision making.  The history, physical exam, and documentation were performed by  Lucrezia Europe, NP.  I reviewed and verified the above  documentation and modified it  as needed. She is tolerating systemic therapy with manageable toxicity, so we will proceed with treatment.  She continues with considerable fatigue as her main complaint.  New shoulder pain as well.  Obtain Xrays of shoulder.  Refer to palliative medicine  to see if they can help with some of her symptom burden.         Signed By:  Heloise Purpura, MD

## 2021-11-10 NOTE — Telephone Encounter (Signed)
[facility] Palliative Medicine Office  Nursing Note  [phone]  Fax [phone]      Name:  Dawn Green  Date of Birth: August 03, 1941    Received outpatient Palliative Medicine referral from Lucrezia Europe NP to see patient for symptom management and supportive care. Chart  reviewed. Dawn Green is a 81 y.o. female with NSCLC of left lung with mets to bilateral supraclavicular and mediastinal nodes, bones, brain.  Patient's most recent office visit with Dr. Thurmond Butts Raddin was on 11/09/21.  See his note for complete HPI, treatment history, and plan.    ACP:     [facility] signed on 04/24/2013 is on file.  Primary Healthcare Decision Maker is spouse Dawn Green. Dawn Green [phone]), Secondary Healthcare Decision Maker is Dawn Green. Dawn Green [phone]).      Nurse called patient and her husband answered the phone.  He states that he schedules patient's appointments. This nurse explained [facility] outpatient Palliative Medicine services. Appointment scheduled for 11/23/21 at 2:30pm with Dr. Chales Salmon.  This nurse instructed Mr. Auker to bring any pain medications his wife is taking in the original container.  Also advised patient that we schedule one hour appointments so if patient is unable to keep the appointment, please call our office @ [phone] as soon as possible to cancel/reschedule the appointment.  He voices understanding.      Daryll Brod, BSN, RN-BC, [facility]

## 2021-11-10 NOTE — Telephone Encounter (Signed)
Spoke to patient's husband regarding results of xray of shoulder. Normal xray, no explanation for pain. Offered referral to ortho. They would like to hold off on this for now. Discussed lidocaine patches available OTC.     They have appointment upcoming with Dr. Rebecca Eaton.     Questioned result of BS of 180 with treatment. Confirmed this is non-fasting number, patient had eaten a couple of donuts prior. Will monitor this on therapy.     Discussed PT referral, they are to call to set up appointment close to their home at a date their schedule will allow.     Encouraged them to call with any further concerns.

## 2021-11-19 NOTE — Telephone Encounter (Signed)
Called patient to advise/confirm upcoming appt with Dr. Rebecca Eaton on 11/23/21  at 2:30 at [facility] . Spoke with Phillis Knack and confirmed appointment.   Also advised to please bring in your Driver's License and Insurance Card and any pain medications in the original container with you to appointment.

## 2021-11-23 ENCOUNTER — Ambulatory Visit: Admit: 2021-11-23 | Payer: MEDICARE | Attending: Internal Medicine | Primary: Family Medicine

## 2021-11-23 ENCOUNTER — Telehealth

## 2021-11-23 ENCOUNTER — Ambulatory Visit: Attending: Internal Medicine | Primary: Family Medicine

## 2021-11-23 DIAGNOSIS — R5383 Other fatigue: Secondary | ICD-10-CM

## 2021-11-23 MED ORDER — SERTRALINE 25 MG TAB
25 mg | ORAL_TABLET | ORAL | 1 refills | Status: DC
Start: 2021-11-23 — End: 2021-12-28

## 2021-11-23 NOTE — Telephone Encounter (Signed)
Palliative Medicine  Nursing Note  [phone]  Fax [phone]      Telephone Call  Patient Name: Dawn Green  Date of Birth: 1941/05/06    11/23/2021        Primary Decision Maker: Sayda, [place] - Spouse - [phone]   Advance Care Planning 11/23/2021   Patient's Healthcare Decision Maker is: Named in scanned ACP document   Confirm Advance Directive Yes, on file       Per Dr. Rebecca Eaton, referral placed for [facility] PT for increased weakness, deconditioning, and right sided shoulder weakness.     Darlina Guys, RN  Palliative Medicine

## 2021-11-23 NOTE — Progress Notes (Signed)
Progress Notes by [name], MD at 11/23/21 1430                Author: [name], MD  Service: --  Author Type: Physician       Filed: 11/24/21 0853  Encounter Date: 11/23/2021  Status: Signed          Editor: [name], MD (Physician)               [place]: [phone])      Patient Name: Dawn Green   Date of Birth: 1941-01-23      Date of Current Visit: 11/23/21   Location of Current Visit:     []  [place] [facility]   [x]  [place] [facility]   []  [place] [facility]   []  Home   [] Synchronous real-time A/V virtual visit      Date of Initial Visit:  11/23/21    Referral from: Thurmond Butts Raddin MD   Primary Care Physician: Esau Grew, DO          SUMMARY:     Dawn Green is a 81 y.o. 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 Alphonzo Grieve for symptom management.  She was diagnosed in 05/2021 and has been undergoing treatment with [facility] with recent scans showing response. Her history is also notable for localized right breast cancer s/p lumpectomy  2014 followed by hormonal therapy for 5 years with no evidence of disease recurrence.      The patients social history includes: she's married to [place]. She and her husband live with their son, Dawn Green, who has schizoaffective disorder, and their beloved shih [place], [place], in [place]. They have another son, Dawn Green, who lives locally. Their 3rd  son died a few years ago. Her husband is a retired Clinical biochemist who was career [employer]      Palliative Medicine is addressing the following current patient/family concerns: symptoms/issues related to lung cancer diagnosis/treatment.      Initial Referral Intake note reviewed      PALLIATIVE DIAGNOSES:                  ICD-10-CM  ICD-9-CM             1.  Fatigue, unspecified type   R53.83  780.79                    2.  Mild episode of  recurrent major depressive disorder (HCC)   F33.0  296.31  sertraline (ZOLOFT) 25 mg tablet                  3.  Weakness generalized   R53.1  780.79                    4.  Poor appetite   R63.0  783.0                    5.  Left shoulder pain, unspecified chronicity   M25.512  719.41                    6.  Drug-induced constipation   K59.03  564.09                E980.5  7.  Metastatic non-small cell lung cancer ([place])   C34.90  162.9                          PLAN:          Patient Instructions     Dear Dawn Green ,      It was a pleasure seeing you today in [facility].      We will see you again in 4 weeks for a synchronous real time A/V visit.      If labs or imaging tests have been ordered for you today, please call the office  at [phone] 48 hours after completion to obtain the results.        Your stated goal:       Your described symptoms were:      Fatigue: 8  Drowsiness: 8     Depression: 6  Pain: 4 (Flucuates)     Anxiety: 0  Nausea: 0     Anorexia: 8  Dyspnea: 0 (infrequent)     Best Well-Being: 8  Constipation: Yes        Other Problem (Comment): 0           This is the plan we talked about:      1. Fatigue   -Fatigue is commonly experienced by people living with cancer and undergoing cancer treatment   -Today we discussed the multiple factors may be affecting your energy, including your mood, your poor sleep, your reduced activity and progressive weakness as well as some of your medications (hydrocodone, amitriptyline, immunotherapy)   -We agreed to work on 2 of these potential factors, your depression and increasing your activity by starting with home physical therapy   -There are medications, such as Ritalin, that may help to reduce your fatigue if this continues to interfere with your ability to do the things you used to enjoy      2. Depression   -Start sertraline 25-mg: take 1 tab daily for 7 days, then increase to 2 tabs daily (taken at the same  time for a total dose of 50-mg)      3. Weakness   -You're deconditioned from progressively less physically active over the past 5 months    -We placed a referral today for home physical therapy      4. Poor appetite   -You've lost about 15 pounds since last August with your weight now stabilized in the 127-130 pound range      5. Left shoulder pain   -Your recent left humerus and left shoulder X-rays showed no acute abnormalities   -You do not appear to have a rotator cuff injury by my exam today   -This appears to be musculoskeletal in nature and may be related to your sleep position in your recliner    -Continue use of lidocaine patches which bring relief   -Continue hydrocodone-acetaminophen 5-325-mg every 8 hour as needed   -The physical therapist can also work with you to improve pain in your shoulder and may advise about better sleep positions in your recliner      6. Constipation    -Start 1/2 cap Miralax daily as needed      7. Lung cancer   -You're being treated with immune therapy under the care of Dr. Alphonzo Grieve   -Your scans of 10/06/21 showed interval response to therapy  This is what you have shared with [place] about Advance Care Planning:        Primary Decision Maker: Dawn Green, Dawn Green - [phone]       Advance Care Planning  11/23/2021        Patient's Healthcare Decision Maker is:  Named in scanned ACP document        Confirm Advance Directive  Yes, on file                 The Palliative Medicine Team is here to support you and your family.          Sincerely,         Jermel Artley, Jarvis Morgan, MD and the Palliative Medicine Team          GOALS OF CARE / TREATMENT PREFERENCES:     [====Goals of Care====]   GOALS OF CARE:   Patient / health care proxy stated goals: See Patient Instructions / Summary      TREATMENT PREFERENCES:    Code Status:  [x]   Attempt Resuscitation       []   Do Not Attempt Resuscitation      Advance Care Planning:   [x]  The Pall Med Interdisciplinary Team has updated  the ACP  Navigator with Decision Maker and Patient Capacity        Primary Decision MakerNeidy, Dawn Green - [phone]       Advance Care Planning  11/23/2021        Patient's Healthcare Decision Maker is:  Named in scanned ACP document        Confirm Advance Directive  Yes, on file           Other:   (If patient appropriate for POST, consider using PALLPOST smart phrase here)      The palliative care team has discussed with patient / health care proxy about goals of care / treatment preferences for patient.   [====Goals of Care====]         PRESCRIPTIONS GIVEN:          Medications Ordered Today       Medications        ?  sertraline (ZOLOFT) 25 mg tablet             Sig: Take 1 tab daily for 7 days, then increase to 2 tabs daily         Dispense:  60 Tablet             Refill:  1                     FOLLOW UP:          Future Appointments           Date  Time  Provider  [place]           11/30/2021   1:00 PM  SS INF2 CH4 <2H  [facility]  [facility]     11/30/2021   1:15 PM  Raddin, Reuel Derby, MD  [facility]  [facility]     12/21/2021   1:30 PM  SS INF3 CH4 <2H  [facility]  [facility]     01/11/2022   1:00 PM  SS INF1 CH4 <2H  [facility]  [facility]           04/29/2022   2:20 PM  Doloresco, Elta Guadeloupe, MD  [facility]  [facility]  PHYSICIANS INVOLVED IN CARE:     Patient Care Team:   Lobb, Laray Anger, DO as PCP - General (Family Medicine)   Esau Grew, DO as PCP - [facility] Empaneled Provider   Raddin, Reuel Derby, MD (Hematology and Oncology)   Idalia Needle, MD (Radiation Oncology)   Mechele Collin, MD (Neurosurgery)   Rebecca Eaton, Jarvis Morgan, MD (Palliative Medicine)            HISTORY:     Reviewed patient-completed ESAS and advance care planning form.   Reviewed patient record in prescription monitoring program.      CHIEF COMPLAINT:      Chief Complaint       Patient presents with        ?  Fatigue        She has no energy.   She feels weak and tired all the time.   She can't do the things she liked to do when she was feeling better, like  shopping, going to the grocery store, driving.   She spends all of her time in her recliner, she sleeps in her recliner.   Her recliner is in her bedroom, which is on the 2nd floor of her home.   She sits in her recliner and watches TV during the day.   She used to like to read but hasn't read anything in a long time.   She rarely goes downstairs except to leave the house to go to doctor's appointments or the infusion center.   Her legs start to shake and she needs help going up and down the stairs.   She's fallen several times over the past months.      Getting her lung cancer diagnosis was quite a blow, it just came out of the blue.   She was diagnosed with breast cancer in 2014 and was cured of that.   She had been coughing for a month or so and it didn't improve with OTC medications.   Her husband took her to the ED where a chest X-ray showed a mass.   She started treatment with immune therapy last August and her last scans showed the cancer was shrinking.   Her adult son, Dawn Green, who has mental health issues, lives with her and her husband, has lived with them all of his life.   This can be stressful at times.      She and her husband are members of a local [employer].   They had been very active in the church before her lung cancer diagnosis.   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      She doesn't have much of an appetite.   She's lost ~ 30# in the last year but her weight seems to now be stabilized ~130#.      She's constipated at times.      She's had an achy pain in her left upper arm and shoulder for a month or so.   She had X-rays done which showed no fractures or bone lesions.   She tended to fall towards the left when she sustained her falls.   She also leans towards the left when she sleeps in her recliner.   She's been using a lidocaine patch which helps and takes 1 to 2  hydrocodone daily.  HPI/SUBJECTIVE:     The patient is: [x]  Verbal / []  Nonverbal          Clinical Pain Assessment (nonverbal scale for nonverbal patients):    [++++ Clinical Pain Assessment++++]   [++++Pain Severity++++]: Pain: 4 (Flucuates)   [++++Pain Character++++]: ache   [++++Pain Duration++++]: month   [++++Pain Effect++++]:   [++++Pain Factors++++]: unable to identify provoking factors; lidocaine patch helps, hydrocodone helps   [++++Pain Frequency++++]: constant with varying intensity   [++++Pain Location++++]: left shoulder   [++++ Clinical Pain Assessment++++]            FUNCTIONAL ASSESSMENT:        Palliative Performance Scale (PPS):   PPS: 60              PSYCHOSOCIAL/SPIRITUAL SCREENING:        Any spiritual / religious concerns:   []  Yes /  [x]  No      Caregiver Burnout:   []  Yes /  [x]  No /  []   No Caregiver Present        Anticipatory grief assessment:    [x]  Normal  / []  Maladaptive         ESAS Anxiety: Anxiety: 0      ESAS Depression: Depression: 6             REVIEW OF SYSTEMS:        The following systems were [x]  reviewed / []  unable to be reviewed   Systems: constitutional, ears/nose/mouth/throat, respiratory, gastrointestinal, genitourinary, musculoskeletal, integumentary, neurologic, psychiatric, endocrine. Positive findings noted below.      Modified ESAS  Completed by: provider     Fatigue: 8  Drowsiness: 8     Depression: 6  Pain: 4 (Flucuates)     Anxiety: 0  Nausea: 0     Anorexia: 8  Dyspnea: 0 (infrequent)     Best Well-Being: 8  Constipation: Yes        Other Problem (Comment): 0                 PHYSICAL EXAM:          Wt Readings from Last 3 Encounters:        11/23/21  127 lb (57.6 kg)     11/09/21  130 lb 14.4 oz (59.4 kg)        11/09/21  (P) 130 lb (59 kg)        Blood pressure (!) 111/59, pulse 91, temperature 97.6 ??F (36.4 ??C), temperature  source Oral, resp. rate 18, height 5' 3"  (1.6 m), weight 127 lb (57.6 kg), SpO2 94 %.   Last bowel movement: See Nursing  Note      Constitutional: sitting in wheelchair, appears fatigued   Eyes: pupils equal, anicteric   ENMT: no nasal discharge, moist mucous membranes   Cardiovascular: regular rhythm, no peripheral edema   Respiratory: breathing not labored, symmetric   Gastrointestinal: soft non-tender, +bowel sounds   Musculoskeletal: no deformity, no tenderness to palpation   Skin: warm, dry   Neurologic: following commands, moving all extremities   Psychiatric: full affect, no hallucinations   Other:            HISTORY:          Past Medical History:        Diagnosis  Date         ?  Breast CA ([place])  2014          Right - Lumpectomy          ?  Diabetes ([place])       ?  Glaucoma       ?  Hx of mammogram  02/05/2016          Negative per pt. Sees Dr. Laurena Bering          ?  Hyperlipemia       ?  Hypertension       ?  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  Other ill-defined conditions(799.89)  02-Jan-2015          Mild URI x 4 months (allergies)          ?  Radiation therapy complication  7564     ?  Routine Papanicolaou smear  1996          Negative per pt.          ?  Skin cancer            Basal cell on left side of nose and upper lip           Past Surgical History:         Procedure  Laterality  Date          ?  HX BREAST BIOPSY  Left            x 2 negative biopsies           ?  HX BREAST LUMPECTOMY  Right  02-Jan-2013     ?  HX CATARACT REMOVAL  Bilateral            w/ IOL implants          ?  HX TAH AND BSO    1997          for rapidly growing fibroid ([facility]) - benign.  Dr. Oran Rein.          ?  [place]     ?  IR INSERT TUNL CVC W PORT OVER 5 YEARS    07/23/2021     ?  PR UNLISTED PROCEDURE BREAST  Right  2013/01/02          Right breast lumpectomy           Family History         Problem  Relation  Age of Onset          ?  Cancer  Mother  31              Ovarian - Passed away in 01/02/1965          ?  Ovarian Cancer  Mother  61     ?  Other  Son  58              Pancreatitis           ?  Cancer  Paternal Aunt   14              Pancreatic         History reviewed, no pertinent family history.     Social History          Tobacco Use         ?  Smoking status:  Former              Packs/day:  1.00         Years:  30.00  Pack years:  30.00         Types:  Cigarettes         Quit date:  04/17/1996         Years since quitting:  25.6         ?  Smokeless tobacco:  Never        ?  Tobacco comments:             Never used vapor or e-cigs        Substance Use Topics         ?  Alcohol use:  Yes              Alcohol/week:  11.7 standard drinks         Types:  14 Standard drinks or equivalent per week             Comment: 1 gin and tonics per day          Allergies        Allergen  Reactions         ?  Metformin  Nausea Only             Stomach upset           Current Outpatient Medications        Medication  Sig         ?  HYDROcodone-acetaminophen (NORCO) 5-325 mg per tablet  Take 1 Tablet by mouth every eight (8) hours as needed for Pain.     ?  guaiFENesin-codeine (ROBITUSSIN AC) 100-10 mg/5 mL solution  Take  by mouth as needed for Cough.     ?  chlorphen/DM/acetaminophen/gg (CORICIDIN HBP DAY-NIGHT PO)  Take  by mouth as needed.     ?  sertraline (ZOLOFT) 25 mg tablet  Take 1 tab daily for 7 days, then increase to 2 tabs daily     ?  lidocaine-prilocaine (EMLA) topical cream  Apply  to affected area as needed for Pain (Apply 30-60 min. prior to having your port accessed).     ?  amitriptyline (ELAVIL) 10 mg tablet  Take 1 Tablet by mouth nightly.     ?  alendronate (FOSAMAX) 35 mg tablet  TAKE 1 TABLET EVERY 7 DAYS     ?  atorvastatin (LIPITOR) 40 mg tablet  TAKE 1 TABLET DAILY BEFORE BREAKFAST     ?  brimonidine-timoloL (COMBIGAN) 0.2-0.5 % drop ophthalmic solution  Administer 1 Drop to both eyes every twelve (12) hours.     ?  aspirin 81 mg chewable tablet  Take 81 mg by mouth daily.         ?  olmesartan (BENICAR) 20 mg tablet  Take 1 Tablet by mouth in the morning.          No current facility-administered medications  for this visit.                 LAB DATA REVIEWED:          Lab Results         Component  Value  Date/Time            WBC  12.3 (H)  11/09/2021 01:01 PM       HGB  10.2 (L)  11/09/2021 01:01 PM            PLATELET  410 (H)  11/09/2021 01:01 PM          Lab Results  Component  Value  Date/Time            Sodium  137  11/09/2021 01:01 PM       Potassium  3.6  11/09/2021 01:01 PM       Chloride  105  11/09/2021 01:01 PM       CO2  27  11/09/2021 01:01 PM       BUN  18  11/09/2021 01:01 PM       Creatinine  0.68  11/09/2021 01:01 PM            Calcium  8.8  11/09/2021 01:01 PM           Lab Results         Component  Value  Date/Time            Alk. phosphatase  83  11/09/2021 01:01 PM       Protein, total  7.2  11/09/2021 01:01 PM       Albumin  2.8 (L)  11/09/2021 01:01 PM            Globulin  4.4 (H)  11/09/2021 01:01 PM        No results found for: INR, PTMR, PTP, PT1, PT2, APTT, INREXT, INREXT    No results found for: IRON, FE, TIBC, IBCT, PSAT, FERR       CT chest/abdomen/pelvis 09/28/21:   Diminished size of lymphadenopathy.   Diminished size of lingular mass.   No acute intraperitoneal/intrathoracic process is identified.       MRI bran 10/21/21:   1. Mild interval decrease in size of left parietal hemorrhagic lesion, presumed   metastasis, though without any demonstrable enhancement on this examination.   Minimal surrounding edema, without significant mass effect.   2. No additional intracranial metastases identified. No acute intracranial   abnormality.   3. Unchanged generalized parenchymal volume loss and mild chronic microvascular   ischemic disease.      X-ray left shoulder 11/09/21:   No acute abnormality.      X-ray left humerus 11/09/21:   No acute abnormality.         CONTROLLED SUBSTANCES SAFETY ASSESSMENT (IF ON  CONTROLLED SUBSTANCES):        Reviewed opioid safety handout:  []  Yes   []  No   24 hour opioid dose >172m morphine equivalent/day:  []   Yes   []  No   Benzodiazepines:  []   Yes   []   No   Sleep apnea:  []   Yes   []  No   Urine Toxicology Testing within last 6 months:  []   Yes   []  No   History of or new aberrant medication taking behaviors:  []   Yes   []  No   Has Narcan been prescribed []  Yes   []  No                 Total time: 60 minutes   Counseling / coordination time: 45 minutes   > 50% counseling / coordination?: yes - chart review; history; exam; symptom assessment and management options; care plan coordination with Palliative Medicine Team; documentation

## 2021-11-23 NOTE — Progress Notes (Signed)
 Palliative Medicine Office Visit  Palliative Medicine Nurse Check In  [phone])    Patient Name: Dawn Green  Date of Birth: [birth year]      Date of Office Visit: 11/23/2021    Patient states:       From Check In Sheet (scanned in Media):  Has a medical provider talked with you about cardiopulmonary resuscitation (CPR)?   [x]  Yes   []  No   []  Unable to obtain    Nurse reminder to complete or update ACP FlowSheet:    Is ACP on the Problem List?    [x]  Yes    []  No  IF ACP Document is ON FILE; Nurse to place ACP on Problem List     Is there an ACP Note in Chart Review/Note?    [x]  Yes    []  No   If NO: ALERT PROVIDER       Primary Decision MakerLexii, [name] [phone]  Advance Care Planning 11/23/2021   Patient's Healthcare Decision Maker is: Named in scanned ACP document   Confirm Advance Directive Yes, on file        Is there anything that we should know about you as a person in order to provide you the best care possible?     Have you been to the ER, urgent care clinic since your last visit?   []  Yes   []  No   []  Unable to obtain    Have you been hospitalized since your last visit?   []  Yes   []  No   []  Unable to obtain    Have you seen or consulted any other health care providers outside of the [facility] since your last visit?   []  Yes   []  No   []  Unable to obtain    Functional status (describe):         Last BM: 11/23/2021    PMP accessed (date):     Bottle review (for opioid pain medication):  Medication 1:   Date filled:   Directions:   # filled:   # left:   # pills taking per day:  Last dose taken:    Medication 2:   Date filled:   Directions:   # filled:   # left:   # pills taking per day:  Last dose taken:    Medication 3:   Date filled:   Directions:   # filled:   # left:   # pills taking per day:  Last dose taken:    Medication 4:   Date filled:   Directions:   # filled:   # left:   # pills taking per day:  Last dose taken:

## 2021-11-24 NOTE — Telephone Encounter (Signed)
Palliative Medicine  Nursing Note  [phone])  Fax [phone]      Telephone Call  Patient Name: Dawn Green  Date of Birth: Apr 09, 1941    11/24/2021        Primary Decision Maker: Genette, Huertas - Spouse - [phone]   Advance Care Planning 11/23/2021   Patient's Healthcare Decision Maker is: Named in scanned ACP document   Confirm Advance Directive Yes, on file     Call returned to [facility] and spoke with Charleston Ropes who stated they can meet with patient Monday next week if that is fine with palliative, which it is.      Darlina Guys, RN  Palliative Medicine

## 2021-11-24 NOTE — Telephone Encounter (Signed)
[place] with [facility] is calling to speak with Kelle Darting regarding a referral. CB # [id number] opt 2

## 2021-11-25 MED FILL — KEYTRUDA 25 MG/ML INTRAVENOUS SOLUTION: 25 mg/mL | INTRAVENOUS | Qty: 8

## 2021-11-30 ENCOUNTER — Inpatient Hospital Stay: Admit: 2021-11-30 | Payer: MEDICARE | Primary: Family Medicine

## 2021-11-30 ENCOUNTER — Ambulatory Visit
Admit: 2021-11-30 | Discharge: 2021-11-30 | Payer: MEDICARE | Attending: Hematology & Oncology | Primary: Family Medicine

## 2021-11-30 ENCOUNTER — Ambulatory Visit: Attending: Hematology & Oncology | Primary: Family Medicine

## 2021-11-30 DIAGNOSIS — C3490 Malignant neoplasm of unspecified part of unspecified bronchus or lung: Secondary | ICD-10-CM

## 2021-11-30 DIAGNOSIS — Z5112 Encounter for antineoplastic immunotherapy: Secondary | ICD-10-CM

## 2021-11-30 LAB — CBC WITH AUTO DIFFERENTIAL
Basophils %: 1 % (ref 0–1)
Basophils Absolute: 0.1 10*3/uL (ref 0.0–0.1)
Eosinophils %: 4 % (ref 0–7)
Eosinophils Absolute: 0.6 10*3/uL — ABNORMAL HIGH (ref 0.0–0.4)
Granulocyte Absolute Count: 0.1 10*3/uL — ABNORMAL HIGH (ref 0.00–0.04)
Hematocrit: 32.1 % — ABNORMAL LOW (ref 35.0–47.0)
Hemoglobin: 10.1 g/dL — ABNORMAL LOW (ref 11.5–16.0)
Immature Granulocytes: 1 % — ABNORMAL HIGH (ref 0.0–0.5)
Lymphocytes %: 12 % (ref 12–49)
Lymphocytes Absolute: 1.8 10*3/uL (ref 0.8–3.5)
MCH: 26.7 PG (ref 26.0–34.0)
MCHC: 31.5 g/dL (ref 30.0–36.5)
MCV: 84.9 FL (ref 80.0–99.0)
MPV: 9 FL (ref 8.9–12.9)
Monocytes %: 11 % (ref 5–13)
Monocytes Absolute: 1.5 10*3/uL — ABNORMAL HIGH (ref 0.0–1.0)
NRBC Absolute: 0 10*3/uL (ref 0.00–0.01)
Neutrophils %: 71 % (ref 32–75)
Neutrophils Absolute: 10.1 10*3/uL — ABNORMAL HIGH (ref 1.8–8.0)
Nucleated RBCs: 0 PER 100 WBC
Platelets: 562 10*3/uL — ABNORMAL HIGH (ref 150–400)
RBC: 3.78 M/uL — ABNORMAL LOW (ref 3.80–5.20)
RDW: 13.7 % (ref 11.5–14.5)
WBC: 14.2 10*3/uL — ABNORMAL HIGH (ref 3.6–11.0)

## 2021-11-30 LAB — COMPREHENSIVE METABOLIC PANEL
ALT: 38 U/L (ref 12–78)
AST: 22 U/L (ref 15–37)
Albumin/Globulin Ratio: 0.5 — ABNORMAL LOW (ref 1.1–2.2)
Albumin: 2.7 g/dL — ABNORMAL LOW (ref 3.5–5.0)
Alkaline Phosphatase: 91 U/L (ref 45–117)
Anion Gap: 4 mmol/L — ABNORMAL LOW (ref 5–15)
BUN: 18 MG/DL (ref 6–20)
Bun/Cre Ratio: 30 — ABNORMAL HIGH (ref 12–20)
CO2: 28 mmol/L (ref 21–32)
Calcium: 9.9 MG/DL (ref 8.5–10.1)
Chloride: 103 mmol/L (ref 97–108)
Creatinine: 0.6 MG/DL (ref 0.55–1.02)
ESTIMATED GLOMERULAR FILTRATION RATE: >60 mL/min/{1.73_m2} (ref >60)
Globulin: 5 g/dL — ABNORMAL HIGH (ref 2.0–4.0)
Glucose: 121 mg/dL — ABNORMAL HIGH (ref 65–100)
Potassium: 3.9 mmol/L (ref 3.5–5.1)
Sodium: 135 mmol/L — ABNORMAL LOW (ref 136–145)
Total Bilirubin: 0.5 MG/DL (ref 0.2–1.0)
Total Protein: 7.7 g/dL (ref 6.4–8.2)

## 2021-11-30 LAB — TSH 3RD GENERATION
TSH: 0.94 u[IU]/mL (ref 0.36–3.74)
TSH: 0.94 u[IU]/mL (ref 0.36–3.74)

## 2021-11-30 LAB — CBC WITH AUTOMATED DIFF
ABS. BASOPHILS: 0.1 10*3/uL (ref 0.0–0.1)
ABS. EOSINOPHILS: 0.6 10*3/uL — ABNORMAL HIGH (ref 0.0–0.4)
ABS. IMM. GRANS.: 0.1 10*3/uL — ABNORMAL HIGH (ref 0.00–0.04)
ABS. LYMPHOCYTES: 1.8 10*3/uL (ref 0.8–3.5)
ABS. MONOCYTES: 1.5 10*3/uL — ABNORMAL HIGH (ref 0.0–1.0)
ABS. NEUTROPHILS: 10.1 10*3/uL — ABNORMAL HIGH (ref 1.8–8.0)
ABSOLUTE NRBC: 0 10*3/uL (ref 0.00–0.01)
BASOPHILS: 1 % (ref 0–1)
EOSINOPHILS: 4 % (ref 0–7)
HCT: 32.1 % — ABNORMAL LOW (ref 35.0–47.0)
HGB: 10.1 g/dL — ABNORMAL LOW (ref 11.5–16.0)
IMMATURE GRANULOCYTES: 1 % — ABNORMAL HIGH (ref 0.0–0.5)
LYMPHOCYTES: 12 % (ref 12–49)
MCH: 26.7 pg (ref 26.0–34.0)
MCHC: 31.5 g/dL (ref 30.0–36.5)
MCV: 84.9 fL (ref 80.0–99.0)
MONOCYTES: 11 % (ref 5–13)
MPV: 9 fL (ref 8.9–12.9)
NEUTROPHILS: 71 % (ref 32–75)
NRBC: 0 /100{WBCs}
PLATELET: 562 10*3/uL — ABNORMAL HIGH (ref 150–400)
RBC: 3.78 M/uL — ABNORMAL LOW (ref 3.80–5.20)
RDW: 13.7 % (ref 11.5–14.5)
WBC: 14.2 10*3/uL — ABNORMAL HIGH (ref 3.6–11.0)

## 2021-11-30 LAB — METABOLIC PANEL, COMPREHENSIVE
A-G Ratio: 0.5 — ABNORMAL LOW (ref 1.1–2.2)
ALT (SGPT): 38 U/L (ref 12–78)
AST (SGOT): 22 U/L (ref 15–37)
Albumin: 2.7 g/dL — ABNORMAL LOW (ref 3.5–5.0)
Alk. phosphatase: 91 U/L (ref 45–117)
Anion gap: 4 mmol/L — ABNORMAL LOW (ref 5–15)
BUN/Creatinine ratio: 30 — ABNORMAL HIGH (ref 12–20)
BUN: 18 mg/dL (ref 6–20)
Bilirubin, total: 0.5 mg/dL (ref 0.2–1.0)
CO2: 28 mmol/L (ref 21–32)
Calcium: 9.9 MG/DL (ref 8.5–10.1)
Chloride: 103 mmol/L (ref 97–108)
Creatinine: 0.6 mg/dL (ref 0.55–1.02)
Globulin: 5 g/dL — ABNORMAL HIGH (ref 2.0–4.0)
Glucose: 121 mg/dL — ABNORMAL HIGH (ref 65–100)
Potassium: 3.9 mmol/L (ref 3.5–5.1)
Protein, total: 7.7 g/dL (ref 6.4–8.2)
Sodium: 135 mmol/L — ABNORMAL LOW (ref 136–145)
eGFR: >60 mL/min/{1.73_m2} (ref >60)

## 2021-11-30 MED ORDER — HEPARIN, PORCINE (PF) 100 UNIT/ML IV SYRINGE
100 unit/mL | INTRAVENOUS | Status: DC | PRN
Start: 2021-11-30 — End: 2021-12-01
  Administered 2021-11-30: 21:00:00

## 2021-11-30 MED ORDER — SODIUM CHLORIDE 0.9 % IV
INTRAVENOUS | Status: DC | PRN
Start: 2021-11-30 — End: 2021-12-01

## 2021-11-30 MED ORDER — SODIUM CHLORIDE 0.9 % IV
INTRAVENOUS | Status: DC | PRN
Start: 2021-11-30 — End: 2021-12-01
  Administered 2021-11-30: 20:00:00 via INTRAVENOUS

## 2021-11-30 MED ORDER — SODIUM CHLORIDE 0.9 % IV
25 mg/mL | Freq: Once | INTRAVENOUS | Status: AC
Start: 2021-11-30 — End: 2021-11-30
  Administered 2021-11-30: 21:00:00 via INTRAVENOUS

## 2021-11-30 MED ORDER — SODIUM CHLORIDE 0.9 % INJECTION
INTRAMUSCULAR | Status: DC | PRN
Start: 2021-11-30 — End: 2021-12-01

## 2021-11-30 MED ORDER — SODIUM CHLORIDE 0.9% BOLUS IV
0.9 % | Freq: Once | INTRAVENOUS | Status: AC | PRN
Start: 2021-11-30 — End: 2021-11-30
  Administered 2021-11-30: 20:00:00 via INTRAVENOUS

## 2021-11-30 MED ORDER — SODIUM CHLORIDE 0.9 % IJ SYRG
INTRAMUSCULAR | Status: DC | PRN
Start: 2021-11-30 — End: 2021-12-01
  Administered 2021-11-30: 21:00:00 via INTRAVENOUS

## 2021-11-30 MED FILL — BD POSIFLUSH NORMAL SALINE 0.9 % INJECTION SYRINGE: INTRAMUSCULAR | Qty: 40

## 2021-11-30 MED FILL — SODIUM CHLORIDE 0.9 % IV: INTRAVENOUS | Qty: 1000

## 2021-11-30 MED FILL — SODIUM CHLORIDE 0.9 % IV: INTRAVENOUS | Qty: 500

## 2021-11-30 NOTE — Progress Notes (Signed)
Progress  Notes by RaddinReuel Derby, Dawn Green at 11/30/21 1315                Author: Heloise Purpura, Dawn Green  Service: --  Author Type: Physician       Filed: 11/30/21 1523  Encounter Date: 11/30/2021  Status: Signed          Editor: Dawn Green, Dawn Derby, Dawn Green (Physician)                       [place] [facility]   [address], [address] [place] [address]   W: [phone]  F: [phone]           Reason for Visit:     Dawn Green is a 81 y.o. female who is seen in follow up for evaluation of lung cancer.        Hematology/Oncology Treatment History:     ??  CT Chest 06/11/2021: Large lingular mass, compatible with neoplasm. Bulky mediastinal  and left hilar adenopathy, compatible with metastatic disease.  Small left pleural effusion, likely malignant.   ??  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  CT A/P 07/06/2021: Incompletely visualized 6.4 cm x 4.6 cm lingular soft tissue mass. Trace left pleural fluid.   ??  MRI Brain 07/06/2021: Nonenhancing/minimally enhancing ring lesion of the (hemorrhagic/melanotic) left parietal mass lesion measures 11 x 15 x 16 mm in size. Ring-enhancing lesion with minimal associated  susceptibility. There is no significant midline shift or mass effect. There is no significant associated vasogenic edema. Despite its atypical appearance most likely represents a metastasis, not fully delineated. Mild cerebral atrophy and chronic microvascular  ischemic change. No other evidence of intracranial metastasis is definitively demonstrated.    ??  PET/CT 07/09/2021: Large lingular mass demonstrates increased tracer activity and is compatible with the known neoplasm. Hypermetabolic bilateral  supraclavicular, mediastinal, and left hilar lymphadenopathy. Hypermetabolic bone lesions concerning for metastatic disease.   ??  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  Gamma knife with Dawn Green on 07/21/2021   ??  Initiated palliative  systemic therapy with Pembrolizumab on 07/27/2021        History of Present Illness:     Presents for follow up on therapy. Met with Dr. Rebecca Green since last visit. Initiated therapy for depression. Will increase dose to 42m on Thursday this week.      They will start PT tomorrow in the home.       Appetite has been down. Eating one donut, chocolate and almonds, a coke float with lactose free ice cream and one Boost daily.       She was previously seen by Dr. ILaurena Beringfor breast cancer.  She had a right lumpectomy 04/2013, radiation 06/2023, and anastrozole from 07/2013 to 07/2018.  She was discharged from his follow up 07/2018.      She is accompanied by her husband today.  [place]  She previously smoked 1ppd, quit 25 years before diagnosis.      Review of systems was obtained and pertinent findings reviewed above. Past medical history, social history, family history, medications, and allergies are located in the electronic medical record.        Physical Exam:     Visit Vitals      BP  (!) 111/53     Pulse  88     Temp  97.8 ??F (36.6 ??C)     Resp  16     Ht  5' 3"  (1.6 m)     Wt  126 lb (57.2 kg)     SpO2  96%        BMI  22.32 kg/m??        ECOG PS: 1   General: no distress   Eyes: anicteric sclerae   HENT: oropharynx clear   Neck: supple   Lymphatic: no cervical, supraclavicular, or inguinal adenopathy   Respiratory: normal respiratory effort   CV: no peripheral edema   GI: soft, nontender, nondistended, no masses   Skin: no rashes, no ecchymoses, no petechiae   Unstable gait        Results:          Lab Results         Component  Value  Date/Time            WBC  14.2 (H)  11/30/2021 01:04 PM       HGB  10.1 (L)  11/30/2021 01:04 PM       HCT  32.1 (L)  11/30/2021 01:04 PM       PLATELET  562 (H)  11/30/2021 01:04 PM       MCV  84.9  11/30/2021 01:04 PM            ABS. NEUTROPHILS  10.1 (H)  11/30/2021 01:04 PM          Lab Results         Component  Value  Date/Time             Sodium  135 (L)  11/30/2021 01:04 PM       Potassium  3.9  11/30/2021 01:04 PM       Chloride  103  11/30/2021 01:04 PM       CO2  28  11/30/2021 01:04 PM       Glucose  121 (H)  11/30/2021 01:04 PM       BUN  18  11/30/2021 01:04 PM       Creatinine  0.60  11/30/2021 01:04 PM       GFR est AA  >60  06/11/2021 10:14 AM       GFR est non-AA  >60  06/11/2021 10:14 AM       Calcium  9.9  11/30/2021 01:04 PM       Glucose (POC)  137 (H)  07/23/2021 12:58 PM            Creatinine (POC)  0.8  03/26/2013 01:52 PM          Lab Results         Component  Value  Date/Time            Bilirubin, total  0.5  11/30/2021 01:04 PM       ALT (SGPT)  38  11/30/2021 01:04 PM       Alk. phosphatase  91  11/30/2021 01:04 PM       Protein, total  7.7  11/30/2021 01:04 PM       Albumin  2.7 (L)  11/30/2021 01:04 PM            Globulin  5.0 (H)  11/30/2021 01:04 PM        CT C/A/P 10/06/2021:   Imaging findings are consistent with interval response to therapy.   Diminished size of lymphadenopathy.  Diminished size of lingular mass.   No acute intraperitoneal/intrathoracic process is identified.         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  Imaging notes extensive disease within her left lung, bilateral supraclavicular and mediastinal nodes, bones, and brain.  Her cancer is  not curable and management is with palliative intent.      Due to high PDL1, she initiated therapy with single agent pembrolizumab.        Tolerating therapy with mild itching and increased fatigue. Will proceed with next cycle today as ordered.       After C8 will discuss moving to every 6 week dosing.       She has elected against enrollment in the DIRECT study.        Foundation[employer] was unable to be run on sample.        2) Brain metastasis   Isolated metastasis on MRI.  She was seen by Dawn Green (neurosurgery) and underwent gamma knife on 10/5. MRI  done at time of procedure with 2 new lesions, uncertain if cancer lesions vs infarcts.      Repeat MRI on 10/21/2020 stable.       3) Cough   Improving. Taking cough syrup PRN.      4) H/o breast cancer   S/p lumpectomy, radiation, and endocrine therapy.      5) Weight loss   Dietician supportive phone call on 07/16/2021. Continue 2 boosts/daily. Stable on therapy.       6) Emotional Well Being   No psychosocial concerns identified today. Patient has adequate support.       7) Itching   Zyrtec daily, Benadryl PRN. Improving.       8) Hypotension   Seen by cardiology, had change in BP medications (reduced). They attempted to stop it, but BP went too high. Continue NS bolus day of therapy PRN.       9) Recurrent falls   Referred to home health PT. No recent falls. Will start PT on Thursday.       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 Thyroid studies are normal. Cortisol normal. Seeing cardiology-likely hypotension contributing. ECHO normal.       Referred to palliative medicine and initiated therapy for depression. Initiated PT. Considering Ritalin therapy.       11) Left shoulder/flank pain   Xray sof left shoulder and humerus unremarkable. Hydrocodone PRN. Obtain bone scan with next imaging.         Plan:        ??  Proceed today with C7 Pembrolizumab (286m) given every 3 weeks   ??  Labs: CBC, CMP prior to each cycle, TSH every 6 weeks   ??  NS bolus 5039mPRN   ??  Hydrocodone PRN   ??  CT and bone scan after C8   ??  VV with Dr. BoRebecca Eatonn 12/28/2021   ??  Follow up every 3 weeks      I personally saw and evaluated the patient and performed the key components of medical decision making.  The history, physical exam, and documentation were performed by MeLucrezia EuropeNP.  I reviewed and verified the above documentation and modified it  as needed. Metastatic lung cancer, on keytruda. She is tolerating systemic therapy with manageable toxicity, so we will proceed with treatment.  Following with Dr. Rebecca Green to  assist with management of her asthenia, which may be secondary to depression.            Signed By:  Dawn Purpura, Dawn Green

## 2021-11-30 NOTE — Progress Notes (Signed)
[facility] Progress Note    Date: November 30, 2021    Name: Dawn Green    MRN: [id number]         DOB: 1941-08-27    Ms. Thau Arrived in a wheelchair and in no distress for C7D1 of Keytruda Regimen + Hydration.  Assessment was completed by Mardella Layman, RN, Patient has had a decreased appetite and difficulty sleeping.  Right chest wall port accessed without difficulty by Mardella Layman, RN, labs drawn & sent for processing.    Chemotherapy Flowsheet 11/30/2021   Cycle C7D1   Date 11/30/2021   Drug / Regimen Keytruda   Pre Hydration given   Notes given       1315- Patient proceed to appointment with Dr. Skipper Cliche.    Ms. Schwalb's vitals were reviewed.  Visit Vitals  BP 111/71   Pulse 88   Temp 97.8 F (36.6 C)   Resp 16   Ht 5\' 3"  (1.6 m)   Wt 57.2 kg (126 lb 3.2 oz)   SpO2 96%   Breastfeeding No   BMI 22.36 kg/m       Lab results were obtained and reviewed.  Recent Results (from the past 12 hour(s))   CBC WITH AUTOMATED DIFF    Collection Time: 11/30/21  1:04 PM   Result Value Ref Range    WBC 14.2 (H) 3.6 - 11.0 K/uL    RBC 3.78 (L) 3.80 - 5.20 M/uL    HGB 10.1 (L) 11.5 - 16.0 g/dL    HCT 40.9 (L) 81.1 - 47.0 %    MCV 84.9 80.0 - 99.0 FL    MCH 26.7 26.0 - 34.0 PG    MCHC 31.5 30.0 - 36.5 g/dL    RDW 91.4 78.2 - 95.6 %    PLATELET 562 (H) 150 - 400 K/uL    MPV 9.0 8.9 - 12.9 FL    NRBC 0.0 0 PER 100 WBC    ABSOLUTE NRBC 0.00 0.00 - 0.01 K/uL    NEUTROPHILS 71 32 - 75 %    LYMPHOCYTES 12 12 - 49 %    MONOCYTES 11 5 - 13 %    EOSINOPHILS 4 0 - 7 %    BASOPHILS 1 0 - 1 %    IMMATURE GRANULOCYTES 1 (H) 0.0 - 0.5 %    ABS. NEUTROPHILS 10.1 (H) 1.8 - 8.0 K/UL    ABS. LYMPHOCYTES 1.8 0.8 - 3.5 K/UL    ABS. MONOCYTES 1.5 (H) 0.0 - 1.0 K/UL    ABS. EOSINOPHILS 0.6 (H) 0.0 - 0.4 K/UL    ABS. BASOPHILS 0.1 0.0 - 0.1 K/UL    ABS. IMM. GRANS. 0.1 (H) 0.00 - 0.04 K/UL    DF AUTOMATED     METABOLIC PANEL, COMPREHENSIVE    Collection Time: 11/30/21  1:04 PM   Result Value Ref Range    Sodium 135 (L) 136 - 145 mmol/L    Potassium 3.9 3.5 - 5.1 mmol/L     Chloride 103 97 - 108 mmol/L    CO2 28 21 - 32 mmol/L    Anion gap 4 (L) 5 - 15 mmol/L    Glucose 121 (H) 65 - 100 mg/dL    BUN 18 6 - 20 MG/DL    Creatinine 2.13 0.86 - 1.02 MG/DL    BUN/Creatinine ratio 30 (H) 12 - 20      eGFR >60 >60 ml/min/1.60m2    Calcium 9.9 8.5 - 10.1 MG/DL    Bilirubin, total 0.5  0.2 - 1.0 MG/DL    ALT (SGPT) 38 12 - 78 U/L    AST (SGOT) 22 15 - 37 U/L    Alk. phosphatase 91 45 - 117 U/L    Protein, total 7.7 6.4 - 8.2 g/dL    Albumin 2.7 (L) 3.5 - 5.0 g/dL    Globulin 5.0 (H) 2.0 - 4.0 g/dL    A-G Ratio 0.5 (L) 1.1 - 2.2     TSH 3RD GENERATION    Collection Time: 11/30/21  1:04 PM   Result Value Ref Range    TSH 0.94 0.36 - 3.74 uIU/mL       Medications:  Medications Administered       0.9% sodium chloride infusion       Admin Date  11/30/2021 Action  New Bag Dose  25 mL/hr Rate  25 mL/hr Route  IntraVENous Administered By  Niel Hummer, RN              heparin (porcine) pf 500 Units       Admin Date  11/30/2021 Action  Given Dose  500 Units Route  InterCATHeter Administered By  Niel Hummer, RN              pembrolizumab (KEYTRUDA) 200 mg in 0.9% sodium chloride 100 mL, overfill volume 10 mL IVPB       Admin Date  11/30/2021 Action  New Bag Dose  200 mg Rate  236 mL/hr Route  IntraVENous Administered By  Niel Hummer, RN              sodium chloride (NS) flush 5-40 mL       Admin Date  11/30/2021 Action  Given Dose  10 mL Route  IntraVENous Administered By  Niel Hummer, RN              sodium chloride 0.9 % bolus infusion 500 mL       Admin Date  11/30/2021 Action  New Bag Dose  500 mL Rate  666.7 mL/hr Route  IntraVENous Administered By  Niel Hummer, RN                   Two nurses verified prior to administering: Drug name, drug dose, infusion volume or drug volume when prepared in a syringe, rate of administration, route of administration,  expiration dates and/or times, appearance and physical integrity of the drugs, rate set on infusion pump, when used  sequencing of drug administration.     Ms. Puccinelli tolerated treatment well and [facility] at [facility]20.   Port de-accessed, flushed & heparinized per protocol. She is to return on December 21 2021 at 1330 for her next appointment.    Niel Hummer, RN  November 30, 2021

## 2021-11-30 NOTE — Progress Notes (Signed)
Dawn Green is a 81 y.o. female follow up for lung cancer.    1. Have you been to the ER, urgent care clinic since your last visit?  Hospitalized since your last visit?no     2. Have you seen or consulted any other health care providers outside of the [facility] since your last visit?  Include any pap smears or colon screening. No    Vitals 11/30/2021   Blood Pressure 111/53   Pulse 88   Temp 97.8   Resp 16   Height 5\' 3"    Weight 126 lb 3.2 oz   SpO2 96   BSA 1.59 m2   BMI 22.36 kg/m2

## 2021-12-01 ENCOUNTER — Encounter: Primary: Family Medicine

## 2021-12-01 ENCOUNTER — Encounter: Admit: 2021-12-01 | Discharge: 2021-12-01 | Payer: MEDICARE | Primary: Family Medicine

## 2021-12-01 NOTE — Home Health (Signed)
 Skilled reason for admission/summary of clinical condition:80 yr old female was referred to home health PT s/p Drs visit for Gen weakness and L shoulder pain. Per pt she was indep with her adls and IAdls before Aug 7977. She was diagnosed with stg 4 Lung cancer with multiple mets. Has been progressively getting weak and lost a lot of wt and has no appetite.  Pt lives in a 2 level house with supportive spouse. Pt is having diff going up and down stairs and has 3-4 falls in past 2 months 2 of which were when she was climbing stairs. Spouse is looking into options of getting stair lift. Pt has to go for infusions every 3 weeks ( 3-3.30 min's session).      Walked to kitchen and back and HR 104, 94  Diagnosis: Lung Ca with mets- Left shoulder pain  PLOF- Pt lives in 2 level house with spouse, elder son and a Development worker, international aid. Needs help with adls, walks with help of furniture, .Per pt she was indep with her ADLs and IADLS prior to Aug  2022  Vision and hearing good.  urinary- incontinence. Constipation. several days. Appetite- not good.   Falls 3 -4 falls in the past 2 months.  Subjective: I am just so weak. Just getting dressed make me tired.   Caregiver: Spouse primary cg and helpful.  Caregiver assists with: Medications, Meals, Bathing, ADL, IADLS, Transportation and Housekeeping Caregiver unable to assist with: na. Caregiver is available 24 hours/day Caregiver is present at this visit and did participate with clinician.  Medications reconciled and all medications are available in the home this visit.  Patient/CG able to demonstrate knowledge through teach back with 80 percent accuracy.  Medications are effective at this time.  ANTICOAGULANTS -  Instructed pt/CG can select appropriate dose ( Medication/dose/frequency).  No ASA or NSAIDS unless ordered by MD.  S & S excessive anticoagulation are: Pink/red urine, swelling/pain of joints,  tarry stools, unusual/excessive bruising or bleeding.   Reviewed sharps precautions  (No  razors, soft toothbrush, etc.).  Instructed  that falls, hitting head or bleeding that won't stop requires immediate medical attention.  SABRA  NARCOTIC ANALGESICS  Instructed re medication dose/ frequency.  Do not exceed maximum dose.  Signs of excessive opioids include dizziness, drowsiness, slow or shallow breathing.  If Narcotic has Acetaminophen in it, instruct re need to monitor any acetaminophen in other med's such as Tylenol, Nyquil, etc .as excessive Acetaminophen can cause liver damage.  Take bowel med's as needed as narcotics can cause constipation      . A list of reconciled medications has been given to the patient/caregiver and a copy has been uploaded to media.    Home health supplies by type and quantity ordered/delivered this visit include: none  Patient/caregiver instructed on plan of care and are agreeable to plan of care at this time.    Clinician reviewed orientation to home health booklet with patient/caregiver including agency phone number, agency complaint process, state hotline number, as well as joint commission's quality hotline number. Consent forms signed.     Patient at risk for falls Yes:   Recommended requesting PT/OT orders due to fall risk YES:   Patient response to recommended requesting of PT/OT orders: agreeable    Discharge planning discussed with patient and caregiver. Discharge planning as follows: Is no longer homebound, Per physician order, When patient is no longer able to participate or progresses to SNF/Hospice, Will discharge when the patient has reached their  maximum functional potential and maximum safety in their home and When goals are met Patient/caregiver did verbalize agreement with discharge planning.     Clinical Assessment (What this means for the patient overall and need for ongoing skilled care): pt is deconditioned and weak, has had multiple falls and had poor appetite. Will benefit from skilled PT to increase strength in B LES, transfers, balance and gait  training. Pt has been sleeping in a recliner since Aug, does not use a walker because there is not much room to use the Rolator. Has cane but prefers to use furniture and walls for support. Pt is motivated and wants to start working with therapist to improve strength. She is also having a lot of pain in her L shoulder and L side of her chest ( ribs). CT scan has been done and per spouse there are mets in her ribs, Hilar nodes  L shoulder and brain.     B Le strneght 3-3+/5  Bed mob- Pt is sleeping on a recliner  Transfers- has diff getting up from low surfaces. Uses B LEs to push up to stand.  Balance- [facility] 10- 9/10  Tinetti 9 /28 indicating high risk for falls.  Amb- walked 40 x 2 with slow speed, reaches for furniture for support. Was tired after walking to kitchen and back.    Written Teaching Material Utilized: PT issued written HEP for B LES in sitting and standing    Specific plan for next visit: increase strength in B LES, transfers, gait    Plan of care and admission to home health status called to attending physician. The following practitioner has agreed to sign the ongoing POC Dr Jodene, and was notified of the following: visit frequency of 2 wk 4    Interdisciplinary communication with: Nathanel Dorthy MAYBELL Stephania OT, Dr Jodene Physician,  for the purpose of POC collaboration  PCP: Dr Jodene  Next scheduled doctor appointment: March 14  Patient/Caregiver instructed to keep follow up appointment because lack of follow through with physician appointments could result in discontinuation of home care services for non-compliance. Patient/Caregiver verbalize knowledge of above through teach back with 100 percent accuracy.  Emergency Preparedness: Patient/Caregiver instructed in the following:  Have one gallon of water per person for at least 3 days on hand.  Have non-perishable food for at least 3 days that do not need to be cooked.  Have flashlights and batteries.  Charge your cell phones and any back up  lithium batteries for your cell phones.  Have 3+ days of back up oxygen in your home.  Have a phone in your home that is hard wired and does not require power.  Have medication for a week in your home.  Make sure you have a caregiver in the home to provide care in case your home health nurse cannot get to your house.  Make sure you have all of your paperwork i.e. written emergency preparedness plan, Identification, insurance cards, DME phone number, physician and pharmacy phone number, agency phone number, and your medications in one place for easy access and in a zip lock bag to protect them.  Take your Admission Handbook, written emergency preparedness plan, written medication list and folder if you relocate in the event of an emergency, if possible.   Call agency if you relocate so we can contact you.    Patient/Caregiver verbalize knowledge of above through teach back with 100 percent accuracy.

## 2021-12-03 ENCOUNTER — Encounter: Primary: Family Medicine

## 2021-12-03 ENCOUNTER — Encounter: Admit: 2021-12-03 | Discharge: 2021-12-03 | Payer: MEDICARE | Primary: Family Medicine

## 2021-12-03 NOTE — Home Health (Signed)
 Subjective: I am trying not to use AD to walk.   Falls since last visit NO(if yes complete the Fall Tracking Form and include bsrifallreport):  Caregiver involvement changes: none  Home health supplies by type and quantity ordered/delivered this visit include: na    Clinician asked if patient has had any physician contact since last home care visit and patient states: NO  Clinician asked if patient has any new or changed medications and patient states:  NO   If Yes, were medications reconciled? N/A reviewed  Was the certifying physician notified of changes in medications? N/A     Clinical assessment (what this visit means for the patient overall and need for ongoing skilled care) and progress or lack of progress towards SPECIFIC goals: Patient with left shoulder pain and weakness following CA diagnosis. Patient with unsteady amb and weakness. Patient states she has not followed up with HEP from initial eval and instructed to perform daily for strengthening. Patient to amb two laps daily with CG with her. CG present for all treament    Written Teaching Material Utilized: N/A    Interdisciplinary communication with:  PT for the purpose of POC collaboration    Discharge planning as follows: Is no longer homebound, Per physician order, Will discharge when the patient has reached their maximum functional potential and maximum safety in their home and When goals are met    Specific plan for next visit: Progress HEP and gait training.     Use mobile phone as primary

## 2021-12-06 ENCOUNTER — Encounter: Admit: 2021-12-06 | Discharge: 2021-12-06 | Payer: MEDICARE | Primary: Family Medicine

## 2021-12-06 NOTE — Home Health (Signed)
 Subjective: I am feeling a little weak today.   Falls since last visit NO(if yes complete the Fall Tracking Form and include bsrifallreport):  Caregiver involvement changes: no changes  Home health supplies by type and quantity ordered/delivered this visit include: na    Clinician asked if patient has had any physician contact since last home care visit and patient states: NO  Clinician asked if patient has any new or changed medications and patient states:  NO   If Yes, were medications reconciled? N/A reviewed  Was the certifying physician notified of changes in medications? N/A     Clinical assessment (what this visit means for the patient overall and need for ongoing skilled care) and progress or lack of progress towards SPECIFIC goals: Patient with increased fall risk and weakness. Educated in dietary intake and ther ex and safety with gait training in home with gait belt for safety. Patient fatigues quickly and is unsteady on feet and will requrie further education. CG very supportive and participated in all aspects of treatment session.    Written Teaching Material Utilized: HEP    Interdisciplinary communication with:  PT for the purpose of POC collaboration    Discharge planning as follows: Per physician order, Will discharge when the patient has reached their maximum functional potential and maximum safety in their home and When goals are met    Specific plan for next visit: Progress HEP    Signature deferred due to concerns of Covid 19 in community.

## 2021-12-09 ENCOUNTER — Encounter: Admit: 2021-12-09 | Discharge: 2021-12-09 | Payer: MEDICARE | Primary: Family Medicine

## 2021-12-09 NOTE — Home Health (Signed)
 Subjective: I am trying to eat more. I had a hard time sleeping last night  Falls since last visit NO(if yes complete the Fall Tracking Form and include bsrifallreport):  Caregiver involvement changes: no changes  Home health supplies by type and quantity ordered/delivered this visit include: na    Clinician asked if patient has had any physician contact since last home care visit and patient states: NO  Clinician asked if patient has any new or changed medications and patient states:  NO   If Yes, were medications reconciled? N/A reviewed  Was the certifying physician notified of changes in medications? N/A     Clinical assessment (what this visit means for the patient overall and need for ongoing skilled care) and progress or lack of progress towards SPECIFIC goals: Patient with progression of strengthening program with standing ther ex added to HEP today with reps of 5 as patient fatigued with 10 reps. Spouse / cG to continue with assisted amb in home for strengthening and to offer small snacks throughout the day and patient to attempt to drink a second supplemental drink for added nutrients, protein, and calories. Patient with coughing spell during visit and had to sit and rest with water and cough syrup taken to aide in relief. Cough subsided after rest.     Written Teaching Material Utilized: HEP for standing ther ex    Interdisciplinary communication with: N/A     Discharge planning as follows: Is no longer homebound, Per physician order, Will discharge when the patient has reached their maximum functional potential and maximum safety in their home and When goals are met    Specific plan for next visit: Progress with balance    Signature deferred due to concerns of Covid 19 in community.

## 2021-12-10 ENCOUNTER — Encounter: Primary: Family Medicine

## 2021-12-13 ENCOUNTER — Encounter: Payer: MEDICARE | Primary: Family Medicine

## 2021-12-15 ENCOUNTER — Encounter: Primary: Family Medicine

## 2021-12-15 ENCOUNTER — Encounter: Admit: 2021-12-15 | Discharge: 2021-12-15 | Payer: MEDICARE | Primary: Family Medicine

## 2021-12-15 NOTE — Home Health (Signed)
 Subjective: I am just a little tired.   Falls since last visit NO(if yes complete the Fall Tracking Form and include bsrifallreport):  Caregiver involvement changes: no changes  Home health supplies by type and quantity ordered/delivered this visit include: na    Clinician asked if patient has had any physician contact since last home care visit and patient states: NO  Clinician asked if patient has any new or changed medications and patient states:  NO   If Yes, were medications reconciled? NO reviewed  Was the certifying physician notified of changes in medications? N/A     Clinical assessment (what this visit means for the patient overall and need for ongoing skilled care) and progress or lack of progress towards SPECIFIC goals: Patient with continued weakness and fatigue with increased cough last few days.  Cough increases with activity and is tiring to pt. Patient with seated ther ex today for LE strengthening and fatigue noted with ther ex. Instructed CG to amb with patient later and patient to continue with HEP. Patient reports improved eating.    Written Teaching Material Utilized: HEP    Interdisciplinary communication with:  PT for the purpose of POC collaboration    Discharge planning as follows: Is no longer homebound, Per physician order, Will discharge when the patient has reached their maximum functional potential and maximum safety in their home and When goals are met    Specific plan for next visit: Progressive strengthening    Signature deferred due to concerns of Covid 19 in community.

## 2021-12-16 ENCOUNTER — Encounter: Payer: MEDICARE | Primary: Family Medicine

## 2021-12-16 MED FILL — KEYTRUDA 25 MG/ML INTRAVENOUS SOLUTION: 25 mg/mL | INTRAVENOUS | Qty: 8

## 2021-12-17 ENCOUNTER — Encounter: Admit: 2021-12-17 | Discharge: 2021-12-17 | Payer: MEDICARE | Primary: Family Medicine

## 2021-12-17 NOTE — Home Health (Signed)
 Subjective: I just feel weak.   Falls since last visit NO(if yes complete the Fall Tracking Form and include bsrifallreport):  Caregiver involvement changes: none  Home health supplies by type and quantity ordered/delivered this visit include: na    Clinician asked if patient has had any physician contact since last home care visit and patient states: NO  Clinician asked if patient has any new or changed medications and patient states:  NO   If Yes, were medications reconciled? N/A reviewed  Was the certifying physician notified of changes in medications? N/A     Clinical assessment (what this visit means for the patient overall and need for ongoing skilled care) and progress or lack of progress towards SPECIFIC goals: Patient with increased weakness today and reports increased coughing at night which fatigues her. Patient weary to be active due to coughing spells. Inctructed in belly breathing to promote improved SpO2 and good O2 exchange today without cough. Recommend considereation of chait lift to allow patient access to downstairs of home. cG has had sales rep out and is considereing same. Patient due fro infusion next week. Educated in dietary intake and pt reports improvement.    Written Teaching Material Utilized: belly breathing in resource booklet    Interdisciplinary communication with:  PT for the purpose of POC collaboration    Discharge planning as follows: Per physician order, Will discharge when the patient has reached their maximum functional potential and maximum safety in their home and When goals are met    Specific plan for next visit: HEP progression and safety with gait training    Signature deferred due to concerns of Covid 19 in community.

## 2021-12-20 ENCOUNTER — Encounter: Admit: 2021-12-20 | Discharge: 2021-12-20 | Payer: MEDICARE | Primary: Family Medicine

## 2021-12-21 ENCOUNTER — Inpatient Hospital Stay: Admit: 2021-12-21 | Payer: MEDICARE | Primary: Family Medicine

## 2021-12-21 ENCOUNTER — Ambulatory Visit
Admit: 2021-12-21 | Discharge: 2021-12-21 | Payer: MEDICARE | Attending: Hematology & Oncology | Primary: Family Medicine

## 2021-12-21 ENCOUNTER — Ambulatory Visit: Attending: Hematology & Oncology | Primary: Family Medicine

## 2021-12-21 DIAGNOSIS — C3490 Malignant neoplasm of unspecified part of unspecified bronchus or lung: Secondary | ICD-10-CM

## 2021-12-21 DIAGNOSIS — Z5112 Encounter for antineoplastic immunotherapy: Secondary | ICD-10-CM

## 2021-12-21 LAB — COMPREHENSIVE METABOLIC PANEL
ALT: 48 U/L (ref 12–78)
AST: 22 U/L (ref 15–37)
Albumin/Globulin Ratio: 0.6 — ABNORMAL LOW (ref 1.1–2.2)
Albumin: 2.6 g/dL — ABNORMAL LOW (ref 3.5–5.0)
Alkaline Phosphatase: 85 U/L (ref 45–117)
Anion Gap: 5 mmol/L (ref 5–15)
BUN: 21 MG/DL — ABNORMAL HIGH (ref 6–20)
Bun/Cre Ratio: 34 — ABNORMAL HIGH (ref 12–20)
CO2: 27 mmol/L (ref 21–32)
Calcium: 10.6 MG/DL — ABNORMAL HIGH (ref 8.5–10.1)
Chloride: 105 mmol/L (ref 97–108)
Creatinine: 0.62 MG/DL (ref 0.55–1.02)
ESTIMATED GLOMERULAR FILTRATION RATE: >60 mL/min/{1.73_m2} (ref >60)
Globulin: 4.7 g/dL — ABNORMAL HIGH (ref 2.0–4.0)
Glucose: 125 mg/dL — ABNORMAL HIGH (ref 65–100)
Potassium: 3.8 mmol/L (ref 3.5–5.1)
Sodium: 137 mmol/L (ref 136–145)
Total Bilirubin: 0.4 MG/DL (ref 0.2–1.0)
Total Protein: 7.3 g/dL (ref 6.4–8.2)

## 2021-12-21 LAB — CBC WITH AUTO DIFFERENTIAL
Basophils %: 1 % (ref 0–1)
Basophils Absolute: 0.1 10*3/uL (ref 0.0–0.1)
Eosinophils %: 9 % — ABNORMAL HIGH (ref 0–7)
Eosinophils Absolute: 1.2 10*3/uL — ABNORMAL HIGH (ref 0.0–0.4)
Granulocyte Absolute Count: 0.1 10*3/uL — ABNORMAL HIGH (ref 0.00–0.04)
Hematocrit: 33 % — ABNORMAL LOW (ref 35.0–47.0)
Hemoglobin: 10 g/dL — ABNORMAL LOW (ref 11.5–16.0)
Immature Granulocytes: 1 % — ABNORMAL HIGH (ref 0.0–0.5)
Lymphocytes %: 14 % (ref 12–49)
Lymphocytes Absolute: 1.9 10*3/uL (ref 0.8–3.5)
MCH: 25.5 PG — ABNORMAL LOW (ref 26.0–34.0)
MCHC: 30.3 g/dL (ref 30.0–36.5)
MCV: 84.2 FL (ref 80.0–99.0)
MPV: 9.2 FL (ref 8.9–12.9)
Monocytes %: 10 % (ref 5–13)
Monocytes Absolute: 1.4 10*3/uL — ABNORMAL HIGH (ref 0.0–1.0)
NRBC Absolute: 0 10*3/uL (ref 0.00–0.01)
Neutrophils %: 65 % (ref 32–75)
Neutrophils Absolute: 8.8 10*3/uL — ABNORMAL HIGH (ref 1.8–8.0)
Nucleated RBCs: 0 PER 100 WBC
Platelets: 444 10*3/uL — ABNORMAL HIGH (ref 150–400)
RBC: 3.92 M/uL (ref 3.80–5.20)
RDW: 14 % (ref 11.5–14.5)
WBC: 13.5 10*3/uL — ABNORMAL HIGH (ref 3.6–11.0)

## 2021-12-21 LAB — METABOLIC PANEL, COMPREHENSIVE
A-G Ratio: 0.6 — ABNORMAL LOW (ref 1.1–2.2)
ALT (SGPT): 48 U/L (ref 12–78)
AST (SGOT): 22 U/L (ref 15–37)
Albumin: 2.6 g/dL — ABNORMAL LOW (ref 3.5–5.0)
Alk. phosphatase: 85 U/L (ref 45–117)
Anion gap: 5 mmol/L (ref 5–15)
BUN/Creatinine ratio: 34 — ABNORMAL HIGH (ref 12–20)
BUN: 21 MG/DL — ABNORMAL HIGH (ref 6–20)
Bilirubin, total: 0.4 mg/dL (ref 0.2–1.0)
CO2: 27 mmol/L (ref 21–32)
Calcium: 10.6 MG/DL — ABNORMAL HIGH (ref 8.5–10.1)
Chloride: 105 mmol/L (ref 97–108)
Creatinine: 0.62 mg/dL (ref 0.55–1.02)
Globulin: 4.7 g/dL — ABNORMAL HIGH (ref 2.0–4.0)
Glucose: 125 mg/dL — ABNORMAL HIGH (ref 65–100)
Potassium: 3.8 mmol/L (ref 3.5–5.1)
Protein, total: 7.3 g/dL (ref 6.4–8.2)
Sodium: 137 mmol/L (ref 136–145)
eGFR: >60 mL/min/{1.73_m2} (ref >60)

## 2021-12-21 LAB — CBC WITH AUTOMATED DIFF
ABS. BASOPHILS: 0.1 10*3/uL (ref 0.0–0.1)
ABS. EOSINOPHILS: 1.2 10*3/uL — ABNORMAL HIGH (ref 0.0–0.4)
ABS. IMM. GRANS.: 0.1 10*3/uL — ABNORMAL HIGH (ref 0.00–0.04)
ABS. LYMPHOCYTES: 1.9 10*3/uL (ref 0.8–3.5)
ABS. MONOCYTES: 1.4 10*3/uL — ABNORMAL HIGH (ref 0.0–1.0)
ABS. NEUTROPHILS: 8.8 10*3/uL — ABNORMAL HIGH (ref 1.8–8.0)
ABSOLUTE NRBC: 0 10*3/uL (ref 0.00–0.01)
BASOPHILS: 1 % (ref 0–1)
EOSINOPHILS: 9 % — ABNORMAL HIGH (ref 0–7)
HCT: 33 % — ABNORMAL LOW (ref 35.0–47.0)
HGB: 10 g/dL — ABNORMAL LOW (ref 11.5–16.0)
IMMATURE GRANULOCYTES: 1 % — ABNORMAL HIGH (ref 0.0–0.5)
LYMPHOCYTES: 14 % (ref 12–49)
MCH: 25.5 PG — ABNORMAL LOW (ref 26.0–34.0)
MCHC: 30.3 g/dL (ref 30.0–36.5)
MCV: 84.2 fL (ref 80.0–99.0)
MONOCYTES: 10 % (ref 5–13)
MPV: 9.2 fL (ref 8.9–12.9)
NEUTROPHILS: 65 % (ref 32–75)
NRBC: 0 /100{WBCs}
PLATELET: 444 10*3/uL — ABNORMAL HIGH (ref 150–400)
RBC: 3.92 M/uL (ref 3.80–5.20)
RDW: 14 % (ref 11.5–14.5)
WBC: 13.5 10*3/uL — ABNORMAL HIGH (ref 3.6–11.0)

## 2021-12-21 MED ORDER — SODIUM CHLORIDE 0.9 % IV
INTRAVENOUS | Status: DC | PRN
Start: 2021-12-21 — End: 2021-12-22

## 2021-12-21 MED ORDER — WATER FOR INJECTION, STERILE INJECTION
2 mg | INTRAMUSCULAR | Status: DC | PRN
Start: 2021-12-21 — End: 2021-12-22

## 2021-12-21 MED ORDER — SODIUM CHLORIDE 0.9 % INJECTION
INTRAMUSCULAR | Status: DC | PRN
Start: 2021-12-21 — End: 2021-12-22
  Administered 2021-12-21: 22:00:00 via INTRAVENOUS

## 2021-12-21 MED ORDER — HEPARIN, PORCINE (PF) 100 UNIT/ML IV SYRINGE
100 unit/mL | INTRAVENOUS | Status: DC | PRN
Start: 2021-12-21 — End: 2021-12-22
  Administered 2021-12-21: 22:00:00

## 2021-12-21 MED ORDER — SODIUM CHLORIDE 0.9 % IJ SYRG
INTRAMUSCULAR | Status: DC | PRN
Start: 2021-12-21 — End: 2021-12-22

## 2021-12-21 MED ORDER — SODIUM CHLORIDE 0.9% BOLUS IV
0.9 % | Freq: Once | INTRAVENOUS | Status: AC | PRN
Start: 2021-12-21 — End: 2021-12-21
  Administered 2021-12-21: 20:00:00 via INTRAVENOUS

## 2021-12-21 MED ORDER — OVERFILL VOLUME
25 mg/mL | Freq: Once | INTRAVENOUS | Status: AC
Start: 2021-12-21 — End: 2021-12-21
  Administered 2021-12-21: 21:00:00 via INTRAVENOUS

## 2021-12-21 MED FILL — SODIUM CHLORIDE 0.9 % IV: INTRAVENOUS | Qty: 1000

## 2021-12-21 MED FILL — BD POSIFLUSH NORMAL SALINE 0.9 % INJECTION SYRINGE: INTRAMUSCULAR | Qty: 40

## 2021-12-21 MED FILL — SODIUM CHLORIDE 0.9 % IV: INTRAVENOUS | Qty: 500

## 2021-12-21 NOTE — Progress Notes (Signed)
 [facility] Progress Note    Date: December 21, 2021    Name: Dawn Green    MRN: [id number]         DOB: Jul 14, 1941    Dawn Green Arrived ambulatory and in no distress for C8D1 of Keytruda Regimen.  Assessment was completed by Mallory, RN, no acute issues at this time, no new complaints voiced.  Right chest wall port accessed without difficulty, labs drawn & sent for processing.    Chemotherapy Flowsheet 12/21/2021   Cycle C8 D1   Date 12/21/2021   Drug / Regimen Keytruda   Pre Hydration given   Notes given         Dawn Green's vitals were reviewed.  Visit Vitals  BP 110/60 (BP 1 Location: Right upper arm, BP Patient Position: Sitting)   Pulse 86   Temp 98.7 F (37.1 C)   Resp 16   Ht 5' 3 (1.6 m)   Wt 57.7 kg (127 lb 3.2 oz)   SpO2 96%   BMI 22.53 kg/m       Lab results were obtained and reviewed.  Recent Results (from the past 12 hour(s))   CBC WITH AUTOMATED DIFF    Collection Time: 12/21/21  1:37 PM   Result Value Ref Range    WBC 13.5 (H) 3.6 - 11.0 K/uL    RBC 3.92 3.80 - 5.20 M/uL    HGB 10.0 (L) 11.5 - 16.0 g/dL    HCT 66.9 (L) 64.9 - 47.0 %    MCV 84.2 80.0 - 99.0 FL    MCH 25.5 (L) 26.0 - 34.0 PG    MCHC 30.3 30.0 - 36.5 g/dL    RDW 85.9 88.4 - 85.4 %    PLATELET 444 (H) 150 - 400 K/uL    MPV 9.2 8.9 - 12.9 FL    NRBC 0.0 0 PER 100 WBC    ABSOLUTE NRBC 0.00 0.00 - 0.01 K/uL    NEUTROPHILS 65 32 - 75 %    LYMPHOCYTES 14 12 - 49 %    MONOCYTES 10 5 - 13 %    EOSINOPHILS 9 (H) 0 - 7 %    BASOPHILS 1 0 - 1 %    IMMATURE GRANULOCYTES 1 (H) 0.0 - 0.5 %    ABS. NEUTROPHILS 8.8 (H) 1.8 - 8.0 K/UL    ABS. LYMPHOCYTES 1.9 0.8 - 3.5 K/UL    ABS. MONOCYTES 1.4 (H) 0.0 - 1.0 K/UL    ABS. EOSINOPHILS 1.2 (H) 0.0 - 0.4 K/UL    ABS. BASOPHILS 0.1 0.0 - 0.1 K/UL    ABS. IMM. GRANS. 0.1 (H) 0.00 - 0.04 K/UL    DF SMEAR SCANNED      RBC COMMENTS NORMOCYTIC, NORMOCHROMIC     METABOLIC PANEL, COMPREHENSIVE    Collection Time: 12/21/21  1:37 PM   Result Value Ref Range    Sodium 137 136 - 145 mmol/L    Potassium 3.8 3.5 - 5.1 mmol/L     Chloride 105 97 - 108 mmol/L    CO2 27 21 - 32 mmol/L    Anion gap 5 5 - 15 mmol/L    Glucose 125 (H) 65 - 100 mg/dL    BUN 21 (H) 6 - 20 MG/DL    Creatinine 9.37 9.44 - 1.02 MG/DL    BUN/Creatinine ratio 34 (H) 12 - 20      eGFR >60 >60 ml/min/1.72m2    Calcium 10.6 (H) 8.5 - 10.1 MG/DL  Bilirubin, total 0.4 0.2 - 1.0 MG/DL    ALT (SGPT) 48 12 - 78 U/L    AST (SGOT) 22 15 - 37 U/L    Alk. phosphatase 85 45 - 117 U/L    Protein, total 7.3 6.4 - 8.2 g/dL    Albumin 2.6 (L) 3.5 - 5.0 g/dL    Globulin 4.7 (H) 2.0 - 4.0 g/dL    A-G Ratio 0.6 (L) 1.1 - 2.2         Medications:    Medications Administered       pembrolizumab (KEYTRUDA) 200 mg in 0.9% sodium chloride 100 mL, overfill volume 10 mL IVPB       Admin Date  12/21/2021 Action  New Bag Dose  200 mg Rate  236 mL/hr Route  IntraVENous Administered By  Neidigh, Jacqueline, RN              sodium chloride 0.9 % bolus infusion 500 mL       Admin Date  12/21/2021 Action  New Bag Dose  500 mL Rate  666.7 mL/hr Route  IntraVENous Administered By  Neidigh, Jacqueline, RN                        Two nurses verified prior to administering: Drug name, drug dose, infusion volume or drug volume when prepared in a syringe, rate of administration, route of administration,  expiration dates and/or times, appearance and physical integrity of the drugs, rate set on infusion pump, when used sequencing of drug administration.       1630- SBAR report given to Asberry, Charity fundraiser. Keytruda infusing without difficulty.      Arlyne Gilmore, RN  December 21, 2021

## 2021-12-21 NOTE — Progress Notes (Signed)
Dawn Green is a 81 y.o. female follow up for lung cancer.    1. Have you been to the ER, urgent care clinic since your last visit?  Hospitalized since your last visit?no     2. Have you seen or consulted any other health care providers outside of the [facility] since your last visit?  Include any pap smears or colon screening. No    Vitals 12/21/2021   Blood Pressure 110/60   Pulse 86   Temp 98.7   Resp 16   Height 5\' 3"    Weight 127 lb 3.2 oz   SpO2 96   BSA 1.6 m2   BMI 22.53 kg/m2

## 2021-12-21 NOTE — Progress Notes (Signed)
Progress  Notes by RaddinReuel Derby, MD at 12/21/21 1345                Author: Heloise Purpura, MD  Service: --  Author Type: Physician       Filed: 12/22/21 1621  Encounter Date: 12/21/2021  Status: Signed          Editor: Deyjah Kindel, Reuel Derby, MD (Physician)                       [facility]   [address], [address] [place] [address]   W: [phone]  F: [phone]           Reason for Visit:     Dawn Green is a 81 y.o. female who is seen in follow up for evaluation of lung cancer.        Hematology/Oncology Treatment History:     ??  CT Chest 06/11/2021: Large lingular mass, compatible with neoplasm. Bulky mediastinal  and left hilar adenopathy, compatible with metastatic disease.  Small left pleural effusion, likely malignant.   ??  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  CT A/P 07/06/2021: Incompletely visualized 6.4 cm x 4.6 cm lingular soft tissue mass. Trace left pleural fluid.   ??  MRI Brain 07/06/2021: Nonenhancing/minimally enhancing ring lesion of the (hemorrhagic/melanotic) left parietal mass lesion measures 11 x 15 x 16 mm in size. Ring-enhancing lesion with minimal associated  susceptibility. There is no significant midline shift or mass effect. There is no significant associated vasogenic edema. Despite its atypical appearance most likely represents a metastasis, not fully delineated. Mild cerebral atrophy and chronic microvascular  ischemic change. No other evidence of intracranial metastasis is definitively demonstrated.    ??  PET/CT 07/09/2021: Large lingular mass demonstrates increased tracer activity and is compatible with the known neoplasm. Hypermetabolic bilateral  supraclavicular, mediastinal, and left hilar lymphadenopathy. Hypermetabolic bone lesions concerning for metastatic disease.   ??  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  Gamma knife with Dr. Oletta Darter on 07/21/2021   ??  Initiated palliative  systemic therapy with Pembrolizumab on 07/27/2021        History of Present Illness:     Presents for follow up on therapy. Reports hacking, dry cough. Feels that she is breathing more shallow. Using cough medication with Codeine, this seems to be helping. Moderate  fatigue, sometimes resting in the day.       Appetite has been improved. Gained 1 lb since last visit. Feels that Zoloft is starting to work.       Left shoulder pain persists, 1-2/10 today. Taking Motrin PRN, this seems to help, but doesn't resolve the pain.  Last dose at 11am this morning.       Mild itching. No rashes. No fever or chills.       She was previously seen by Dr. Laurena Bering for breast cancer.  She had a right lumpectomy 04/2013, radiation 06/2023, and anastrozole from 07/2013 to 07/2018.  She was discharged from his follow up 07/2018.      She is accompanied by her husband today.  She lives with her husband and son in [place].  She previously smoked 1ppd, quit 25 years before diagnosis.      Review of systems was obtained and pertinent findings reviewed above. Past medical history, social history, family history, medications, and allergies  are located in the electronic medical record.        Physical Exam:     Visit Vitals      BP  110/60     Pulse  86     Temp  98.7 ??F (37.1 ??C)     Resp  16     Ht  5' 3"  (1.6 m)     Wt  127 lb (57.6 kg)     SpO2  96%        BMI  22.50 kg/m??           ECOG PS: 1   General: no distress   Eyes: anicteric sclerae   HENT: oropharynx clear   Neck: supple   Lymphatic: no cervical, supraclavicular, or inguinal adenopathy   Respiratory: normal respiratory effort   CV: no peripheral edema   GI: soft, nontender, nondistended, no masses   Skin: no rashes, no ecchymoses, no petechiae   Unstable gait        Results:          Lab Results         Component  Value  Date/Time            WBC  13.5 (H)  12/21/2021 01:37 PM       HGB  10.0 (L)  12/21/2021 01:37 PM       HCT  33.0 (L)  12/21/2021 01:37 PM       PLATELET  444 (H)   12/21/2021 01:37 PM       MCV  84.2  12/21/2021 01:37 PM            ABS. NEUTROPHILS  8.8 (H)  12/21/2021 01:37 PM          Lab Results         Component  Value  Date/Time            Sodium  137  12/21/2021 01:37 PM       Potassium  3.8  12/21/2021 01:37 PM       Chloride  105  12/21/2021 01:37 PM       CO2  27  12/21/2021 01:37 PM       Glucose  125 (H)  12/21/2021 01:37 PM       BUN  21 (H)  12/21/2021 01:37 PM       Creatinine  0.62  12/21/2021 01:37 PM       GFR est AA  >60  06/11/2021 10:14 AM       GFR est non-AA  >60  06/11/2021 10:14 AM       Calcium  10.6 (H)  12/21/2021 01:37 PM       Glucose (POC)  137 (H)  07/23/2021 12:58 PM            Creatinine (POC)  0.8  03/26/2013 01:52 PM          Lab Results         Component  Value  Date/Time            Bilirubin, total  0.4  12/21/2021 01:37 PM       ALT (SGPT)  48  12/21/2021 01:37 PM       Alk. phosphatase  85  12/21/2021 01:37 PM       Protein, total  7.3  12/21/2021 01:37 PM       Albumin  2.6 (L)  12/21/2021 01:37 PM  Globulin  4.7 (H)  12/21/2021 01:37 PM        CT C/A/P 10/06/2021:   Imaging findings are consistent with interval response to therapy.   Diminished size of lymphadenopathy.   Diminished size of lingular mass.   No acute intraperitoneal/intrathoracic process is identified.         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  Imaging notes extensive disease within her left lung, bilateral supraclavicular and mediastinal nodes, bones, and brain.  Her cancer is  not curable and management is with palliative intent.      Due to high PDL1, she initiated therapy with single agent pembrolizumab.        Tolerating therapy with mild itching and increased fatigue. Will proceed with next cycle today as ordered.       We discussed moving to every 6 week dosing after this cycle, but they have elected to maintain dose as is.       She  has elected against enrollment in the DIRECT study.        FoundationOne was unable to be run on sample.        2) Brain metastasis   Isolated metastasis on MRI.  She was seen by Dr. Oletta Darter (neurosurgery) and underwent gamma knife on 10/5. MRI done at time of procedure with 2 new lesions, uncertain if cancer lesions vs infarcts.      Repeat MRI on 10/21/2020 stable.       3) Cough   Improving. Taking cough syrup PRN.      4) H/o breast cancer   S/p lumpectomy, radiation, and endocrine therapy.      5) Weight loss   Dietician supportive phone call on 07/16/2021. Continue 2 boosts/daily. Stable on therapy.       6) Emotional Well Being   No psychosocial concerns identified today. Patient has adequate support.       7) Itching   Zyrtec daily, Benadryl PRN. Improving.       8) Hypotension   Seen by cardiology, had change in BP medications (reduced). They attempted to stop it, but BP went too high. Continue NS bolus day of therapy PRN.       9) Recurrent falls   Referred to home health PT. No recent falls. Will be discharged from PT soon. They are considering a stair lift to be installed in the home.       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 Thyroid studies are normal. Cortisol normal. Seeing cardiology-likely hypotension contributing. ECHO normal.       Will fu with palliative care upcoming. Considering Ritalin therapy.       11) Left shoulder/flank pain   Xray sof left shoulder and humerus unremarkable. Hydrocodone PRN. Obtain bone scan with next imaging.         Plan:        ??  Proceed today with C8 Pembrolizumab (236m) given every 3 weeks   ??  Labs: CBC, CMP prior to each cycle, TSH every 6 weeks   ??  NS bolus 5011mPRN   ??  Hydrocodone PRN   ??  CT and bone scan after C8   ??  VV with Dr. BoRebecca Eatonn 12/28/2021   ??  Follow up every 3 weeks      I personally saw and evaluated the patient  and performed the key components of medical decision making.  The history, physical exam, and documentation were  performed by Dawn Europe, NP.  I reviewed and verified the above documentation and modified it  as needed. She is tolerating systemic therapy with manageable toxicity, so we will proceed with treatment.  Repeat scans before next visit.            Signed By:  Heloise Purpura, MD

## 2021-12-21 NOTE — Progress Notes (Signed)
 [facility] Progress Note    Date: December 21, 2021    Name: Dawn Green    MRN: [id number]         DOB: 1940-10-18      SBAR received from [place], [place] @1630 .     Chemotherapy Flowsheet 12/21/2021   Cycle C8 D1   Date 12/21/2021   Drug / Regimen Keytruda   Pre Hydration given   Notes given         Dawn Green's vitals were reviewed.  Visit Vitals  BP 119/66 (BP 1 Location: Right upper arm, BP Patient Position: Sitting)   Pulse 76   Temp 98.7 F (37.1 C)   Resp 16   Ht 5' 3 (1.6 m)   Wt 57.7 kg (127 lb 3.2 oz)   SpO2 96%   BMI 22.53 kg/m       Lab results were obtained and reviewed.  Recent Results (from the past 12 hour(s))   CBC WITH AUTOMATED DIFF    Collection Time: 12/21/21  1:37 PM   Result Value Ref Range    WBC 13.5 (H) 3.6 - 11.0 K/uL    RBC 3.92 3.80 - 5.20 M/uL    HGB 10.0 (L) 11.5 - 16.0 g/dL    HCT 66.9 (L) 64.9 - 47.0 %    MCV 84.2 80.0 - 99.0 FL    MCH 25.5 (L) 26.0 - 34.0 PG    MCHC 30.3 30.0 - 36.5 g/dL    RDW 85.9 88.4 - 85.4 %    PLATELET 444 (H) 150 - 400 K/uL    MPV 9.2 8.9 - 12.9 FL    NRBC 0.0 0 PER 100 WBC    ABSOLUTE NRBC 0.00 0.00 - 0.01 K/uL    NEUTROPHILS 65 32 - 75 %    LYMPHOCYTES 14 12 - 49 %    MONOCYTES 10 5 - 13 %    EOSINOPHILS 9 (H) 0 - 7 %    BASOPHILS 1 0 - 1 %    IMMATURE GRANULOCYTES 1 (H) 0.0 - 0.5 %    ABS. NEUTROPHILS 8.8 (H) 1.8 - 8.0 K/UL    ABS. LYMPHOCYTES 1.9 0.8 - 3.5 K/UL    ABS. MONOCYTES 1.4 (H) 0.0 - 1.0 K/UL    ABS. EOSINOPHILS 1.2 (H) 0.0 - 0.4 K/UL    ABS. BASOPHILS 0.1 0.0 - 0.1 K/UL    ABS. IMM. GRANS. 0.1 (H) 0.00 - 0.04 K/UL    DF SMEAR SCANNED      RBC COMMENTS NORMOCYTIC, NORMOCHROMIC     METABOLIC PANEL, COMPREHENSIVE    Collection Time: 12/21/21  1:37 PM   Result Value Ref Range    Sodium 137 136 - 145 mmol/L    Potassium 3.8 3.5 - 5.1 mmol/L    Chloride 105 97 - 108 mmol/L    CO2 27 21 - 32 mmol/L    Anion gap 5 5 - 15 mmol/L    Glucose 125 (H) 65 - 100 mg/dL    BUN 21 (H) 6 - 20 MG/DL    Creatinine 9.37 9.44 - 1.02 MG/DL    BUN/Creatinine ratio 34 (H) 12 - 20       eGFR >60 >60 ml/min/1.22m2    Calcium 10.6 (H) 8.5 - 10.1 MG/DL    Bilirubin, total 0.4 0.2 - 1.0 MG/DL    ALT (SGPT) 48 12 - 78 U/L    AST (SGOT) 22 15 - 37 U/L    Alk. phosphatase 85 45 -  117 U/L    Protein, total 7.3 6.4 - 8.2 g/dL    Albumin 2.6 (L) 3.5 - 5.0 g/dL    Globulin 4.7 (H) 2.0 - 4.0 g/dL    A-G Ratio 0.6 (L) 1.1 - 2.2         Medications:  Medications Administered       pembrolizumab (KEYTRUDA) 200 mg in 0.9% sodium chloride 100 mL, overfill volume 10 mL IVPB       Admin Date  12/21/2021 Action  New Bag Dose  200 mg Rate  236 mL/hr Route  IntraVENous Administered By  Neidigh, Jacqueline, RN              sodium chloride 0.9 % bolus infusion 500 mL       Admin Date  12/21/2021 Action  New Bag Dose  500 mL Rate  666.7 mL/hr Route  IntraVENous Administered By  Neidigh, Jacqueline, RN                           Dawn Green tolerated treatment well and [facility] at 1650.   Port de-accessed, flushed & heparinized per protocol. She is to return on March 28 at 1300 for her next appointment.    Asberry LITTIE Spence, RN  December 21, 2021

## 2021-12-22 ENCOUNTER — Encounter: Admit: 2021-12-22 | Discharge: 2021-12-22 | Payer: MEDICARE | Primary: Family Medicine

## 2021-12-22 NOTE — Home Health (Signed)
Subjective: Pt said tat her legs and knees feel very weak and shaky. She has come down stairs today after a kong time. Spouse wanted to check f they shd install stair lift- and PT instructed that they shd do it ASAP because pt is getting weaker and weaker.  Falls since last visit (if yes include bsrifallreport): no   yes    Caregiver involvement:    Home health supplies by type and quantity ordered/delivered this visit include: no    Does the patient have any new or changed medications? No  If Yes, were medications reconciled?  NA  Was the certifying physician notified of changes in medications? NA    Clinical assessment (what this visit means for the patient overall and need for ongoing skilled care):   Pt was referred to home care PT s/p diagnosis with CA wit mets. Pt is very weak and deconditioned. Pt gets very weak before her infusions. She has 3 weeks of therapy but has not made much improvement and also has had some decline in her functional status. She was not able to participate in therapy for few sessions because of getting coughing spells and weakness.   Pt's spouse is in the middle f deciding on installing a stair lift because she gets very weak and has to stay p in her room when she feels weak. Pt has come down today after along time and spouse sad it was very diff for her to negotiate stairs. PT has recommended installing lift ASAP. Also pt ws trying to use a cane to walk but is very unsteady- PT instructed and demonstrated proper use of Rolator. Pt's house has lot f furniture and has diff getting the walker around. PT ha instructed spouse to move stuff to make clear path for her to walk. Pt continues to be high risk for falls as indicated by tinetti score of 5/28.  Pt has reached max potential and is being discharged form PT. Spouse and pt in agreement  Progress or lack of progress toward specific goals:   pt made poor progress and has had some decline due to weakness before her infusions/ cancer.  Unable to progress du to poor participation- ws having cough spells and too weak to do therapy..            Interdisciplinary communication: none  Discharge planning: DC today

## 2021-12-27 NOTE — Telephone Encounter (Signed)
 Called patient to advise/confirm upcoming vv appt with Dr. Jodene on 12/28/21  at 1:30 at virtual. Spoke with Nancyann Sharps and confirmed appointment.

## 2021-12-28 ENCOUNTER — Telehealth

## 2021-12-28 ENCOUNTER — Ambulatory Visit: Payer: MEDICARE | Attending: Internal Medicine | Primary: Family Medicine

## 2021-12-28 NOTE — Telephone Encounter (Signed)
 Per Dr. Jodene, Dawn Green, palliative PSR, returned call to Mr. Abdulla to discuss rescheduling appt as Dr. Jodene was unable to connect for virtual visit.     Kristan Jacobson RN  Palliative Medicine

## 2021-12-28 NOTE — Telephone Encounter (Signed)
 The patient's spouse would like to speak with the nurse regarding increasing Zoloft and prescribing Ritalin. Please give him a call back [phone].

## 2021-12-28 NOTE — Telephone Encounter (Signed)
 Pt's husband left vm requesting callback RE status of 1:30 pm VV.

## 2021-12-28 NOTE — Telephone Encounter (Signed)
 Palliative Medicine  Nursing Note  [phone]  Fax [phone]      Telephone Call  Patient Name: Dawn Green  Date of Birth: [birth year]    12/28/2021        Primary Decision Maker: Wilmoth, Rasnic - Spouse - [phone]   Advance Care Planning 12/28/2021   Patient's Healthcare Decision Maker is: Named in scanned ACP document   Confirm Advance Directive Yes, on file     Call returned to Mr. Stachowski who shared that his wife is running low on Zoloft and will need a refill within the next several days.  She is taking 2 tabs (50mg ) as directed and is doing well. He is [facility] with 3 refills; it's a cost savings he says. He shared that it seems to be helping lift her mood.    He shared that she would be interested in trying the Ritalin as the Infusions are really tiring her out.     She had a tumble earlier today, lost her footing, sat down and rolled back, but was not hurt and is in very good spirits tonight.     They are able to reschedule appt if Dr. Jodene needs to see her prior to starting the Ritalin.    Pended Zoloft 50mg  1 tab daily # 90 with 3 refills.       Kristan Jacobson, RN  Palliative Medicine

## 2021-12-29 MED ORDER — SERTRALINE 50 MG TAB
50 mg | ORAL_TABLET | Freq: Every day | ORAL | 3 refills | Status: AC
Start: 2021-12-29 — End: ?

## 2022-01-04 ENCOUNTER — Inpatient Hospital Stay: Admit: 2022-01-04 | Payer: MEDICARE | Attending: Family | Primary: Family Medicine

## 2022-01-04 DIAGNOSIS — C3490 Malignant neoplasm of unspecified part of unspecified bronchus or lung: Secondary | ICD-10-CM

## 2022-01-04 MED ORDER — IOPAMIDOL 76 % IV SOLN
370 mg iodine /mL (76 %) | Freq: Once | INTRAVENOUS | Status: AC
Start: 2022-01-04 — End: 2022-01-04
  Administered 2022-01-04: 16:00:00 via INTRAVENOUS

## 2022-01-04 MED ORDER — TECHNETIUM TC-99M MEDRONATE SODIUM 25 MG IV SOLUTION KIT
25 mg | Freq: Once | INTRAVENOUS | Status: AC
Start: 2022-01-04 — End: 2022-01-04
  Administered 2022-01-04: 16:00:00 via INTRAVENOUS

## 2022-01-04 MED FILL — ISOVUE-370  76 % INTRAVENOUS SOLUTION: 370 mg iodine /mL (76 %) | INTRAVENOUS | Qty: 100

## 2022-01-06 MED FILL — KEYTRUDA 25 MG/ML INTRAVENOUS SOLUTION: 25 mg/mL | INTRAVENOUS | Qty: 8

## 2022-01-10 ENCOUNTER — Telehealth: Admit: 2022-01-10 | Discharge: 2022-01-10 | Payer: MEDICARE | Attending: Internal Medicine | Primary: Family Medicine

## 2022-01-10 DIAGNOSIS — R5383 Other fatigue: Secondary | ICD-10-CM

## 2022-01-10 MED ORDER — METHYLPHENIDATE 5 MG TAB
5 mg | ORAL_TABLET | Freq: Every morning | ORAL | 0 refills | Status: DC
Start: 2022-01-10 — End: 2022-01-26

## 2022-01-10 NOTE — Telephone Encounter (Signed)
 Joesph with [facility] is calling to request a referral for skilled nsg PT/OT. CB # [id number]

## 2022-01-10 NOTE — Telephone Encounter (Signed)
Spoke with [facility] nurse [facility]. Patient and family had a hospice informational session on 3/24, in which declined at this time to proceed with hospice. Instead requesting skilled nursing, and PT/OT. Patient has an VV today at 130 PM with Dr. Doreene Nest to discuss further.     Fax number for referral for skilled nursing, PT/OT [phone]

## 2022-01-10 NOTE — Progress Notes (Signed)
 Palliative Medicine Office Visit  Palliative Medicine Nurse Check In  [phone])    Patient Name: Dawn Green  Date of Birth: 11-11-1940      Date of Office Visit:     Patient states:       From Check In Sheet (scanned in Media):  Has a medical provider talked with you about cardiopulmonary resuscitation (CPR)?   [x]  Yes   []  No   []  Unable to obtain    Nurse reminder to complete or update ACP FlowSheet:    Is ACP on the Problem List?    [x]  Yes    []  No  IF ACP Document is ON FILE; Nurse to place ACP on Problem List     Is there an ACP Note in Chart Review/Note?    [x]  Yes    []  No   If NO: ALERT PROVIDER       Primary Decision MakerLatarshia, [place] [phone]  Advance Care Planning 01/10/2022   Patient's Healthcare Decision Maker is: Named in scanned ACP document   Confirm Advance Directive Yes, on file           Is there anything that we should know about you as a person in order to provide you the best care possible?     Have you been to the ER, urgent care clinic since your last visit?   []  Yes   [x]  No   []  Unable to obtain    Have you been hospitalized since your last visit?   []  Yes   [x]  No   []  Unable to obtain    Have you seen or consulted any other health care providers outside of the [facility] since your last visit?   []  Yes   [x]  No   []  Unable to obtain    Functional status (describe):         Last BM: 01/10/2022    PMP accessed (date):     Bottle review (for opioid pain medication):  Medication 1:   Date filled:   Directions:   # filled:   # left:   # pills taking per day:  Last dose taken:    Medication 2:   Date filled:   Directions:   # filled:   # left:   # pills taking per day:  Last dose taken:    Medication 3:   Date filled:   Directions:   # filled:   # left:   # pills taking per day:  Last dose taken:    Medication 4:   Date filled:   Directions:   # filled:   # left:   # pills taking per day:  Last dose taken:

## 2022-01-11 ENCOUNTER — Telehealth

## 2022-01-11 ENCOUNTER — Inpatient Hospital Stay: Admit: 2022-01-11 | Payer: MEDICARE | Primary: Family Medicine

## 2022-01-11 ENCOUNTER — Ambulatory Visit
Admit: 2022-01-11 | Discharge: 2022-01-11 | Payer: MEDICARE | Attending: Hematology & Oncology | Primary: Family Medicine

## 2022-01-11 DIAGNOSIS — Z5112 Encounter for antineoplastic immunotherapy: Secondary | ICD-10-CM

## 2022-01-11 DIAGNOSIS — C3490 Malignant neoplasm of unspecified part of unspecified bronchus or lung: Secondary | ICD-10-CM

## 2022-01-11 LAB — COMPREHENSIVE METABOLIC PANEL
ALT: 28 U/L (ref 12–78)
AST: 20 U/L (ref 15–37)
Albumin/Globulin Ratio: 0.5 — ABNORMAL LOW (ref 1.1–2.2)
Albumin: 2.6 g/dL — ABNORMAL LOW (ref 3.5–5.0)
Alkaline Phosphatase: 78 U/L (ref 45–117)
Anion Gap: 4 mmol/L — ABNORMAL LOW (ref 5–15)
BUN: 21 MG/DL — ABNORMAL HIGH (ref 6–20)
Bun/Cre Ratio: 35 — ABNORMAL HIGH (ref 12–20)
CO2: 30 mmol/L (ref 21–32)
Calcium: 10.7 MG/DL — ABNORMAL HIGH (ref 8.5–10.1)
Chloride: 102 mmol/L (ref 97–108)
Creatinine: 0.6 MG/DL (ref 0.55–1.02)
ESTIMATED GLOMERULAR FILTRATION RATE: >60 mL/min/{1.73_m2} (ref >60)
Globulin: 4.8 g/dL — ABNORMAL HIGH (ref 2.0–4.0)
Glucose: 126 mg/dL — ABNORMAL HIGH (ref 65–100)
Potassium: 3.4 mmol/L — ABNORMAL LOW (ref 3.5–5.1)
Sodium: 136 mmol/L (ref 136–145)
Total Bilirubin: 0.6 MG/DL (ref 0.2–1.0)
Total Protein: 7.4 g/dL (ref 6.4–8.2)

## 2022-01-11 LAB — CBC WITH AUTO DIFFERENTIAL
Basophils %: 0 % (ref 0–1)
Basophils Absolute: 0 10*3/uL (ref 0.0–0.1)
Eosinophils %: 9 % — ABNORMAL HIGH (ref 0–7)
Eosinophils Absolute: 1.2 10*3/uL — ABNORMAL HIGH (ref 0.0–0.4)
Granulocyte Absolute Count: 0.1 10*3/uL — ABNORMAL HIGH (ref 0.00–0.04)
Hematocrit: 32 % — ABNORMAL LOW (ref 35.0–47.0)
Hemoglobin: 10.1 g/dL — ABNORMAL LOW (ref 11.5–16.0)
Immature Granulocytes: 1 % — ABNORMAL HIGH (ref 0.0–0.5)
Lymphocytes %: 17 % (ref 12–49)
Lymphocytes Absolute: 2.3 10*3/uL (ref 0.8–3.5)
MCH: 25.8 PG — ABNORMAL LOW (ref 26.0–34.0)
MCHC: 31.6 g/dL (ref 30.0–36.5)
MCV: 81.8 FL (ref 80.0–99.0)
MPV: 9 FL (ref 8.9–12.9)
Monocytes %: 12 % (ref 5–13)
Monocytes Absolute: 1.6 10*3/uL — ABNORMAL HIGH (ref 0.0–1.0)
NRBC Absolute: 0 10*3/uL (ref 0.00–0.01)
Neutrophils %: 61 % (ref 32–75)
Neutrophils Absolute: 8.4 10*3/uL — ABNORMAL HIGH (ref 1.8–8.0)
Nucleated RBCs: 0 PER 100 WBC
Platelets: 487 10*3/uL — ABNORMAL HIGH (ref 150–400)
RBC: 3.91 M/uL (ref 3.80–5.20)
RDW: 14.1 % (ref 11.5–14.5)
WBC: 13.6 10*3/uL — ABNORMAL HIGH (ref 3.6–11.0)

## 2022-01-11 LAB — TSH 3RD GENERATION
TSH: 1.8 u[IU]/mL (ref 0.36–3.74)
TSH: 1.8 u[IU]/mL (ref 0.36–3.74)

## 2022-01-11 LAB — CBC WITH AUTOMATED DIFF
ABS. BASOPHILS: 0 10*3/uL (ref 0.0–0.1)
ABS. EOSINOPHILS: 1.2 10*3/uL — ABNORMAL HIGH (ref 0.0–0.4)
ABS. IMM. GRANS.: 0.1 10*3/uL — ABNORMAL HIGH (ref 0.00–0.04)
ABS. LYMPHOCYTES: 2.3 10*3/uL (ref 0.8–3.5)
ABS. MONOCYTES: 1.6 10*3/uL — ABNORMAL HIGH (ref 0.0–1.0)
ABS. NEUTROPHILS: 8.4 10*3/uL — ABNORMAL HIGH (ref 1.8–8.0)
ABSOLUTE NRBC: 0 10*3/uL (ref 0.00–0.01)
BASOPHILS: 0 % (ref 0–1)
EOSINOPHILS: 9 % — ABNORMAL HIGH (ref 0–7)
HCT: 32 % — ABNORMAL LOW (ref 35.0–47.0)
HGB: 10.1 g/dL — ABNORMAL LOW (ref 11.5–16.0)
IMMATURE GRANULOCYTES: 1 % — ABNORMAL HIGH (ref 0.0–0.5)
LYMPHOCYTES: 17 % (ref 12–49)
MCH: 25.8 PG — ABNORMAL LOW (ref 26.0–34.0)
MCHC: 31.6 g/dL (ref 30.0–36.5)
MCV: 81.8 FL (ref 80.0–99.0)
MONOCYTES: 12 % (ref 5–13)
MPV: 9 FL (ref 8.9–12.9)
NEUTROPHILS: 61 % (ref 32–75)
NRBC: 0 PER 100 WBC
PLATELET: 487 10*3/uL — ABNORMAL HIGH (ref 150–400)
RBC: 3.91 M/uL (ref 3.80–5.20)
RDW: 14.1 % (ref 11.5–14.5)
WBC: 13.6 10*3/uL — ABNORMAL HIGH (ref 3.6–11.0)

## 2022-01-11 LAB — METABOLIC PANEL, COMPREHENSIVE
A-G Ratio: 0.5 — ABNORMAL LOW (ref 1.1–2.2)
ALT (SGPT): 28 U/L (ref 12–78)
AST (SGOT): 20 U/L (ref 15–37)
Albumin: 2.6 g/dL — ABNORMAL LOW (ref 3.5–5.0)
Alk. phosphatase: 78 U/L (ref 45–117)
Anion gap: 4 mmol/L — ABNORMAL LOW (ref 5–15)
BUN/Creatinine ratio: 35 — ABNORMAL HIGH (ref 12–20)
BUN: 21 MG/DL — ABNORMAL HIGH (ref 6–20)
Bilirubin, total: 0.6 mg/dL (ref 0.2–1.0)
CO2: 30 mmol/L (ref 21–32)
Calcium: 10.7 MG/DL — ABNORMAL HIGH (ref 8.5–10.1)
Chloride: 102 mmol/L (ref 97–108)
Creatinine: 0.6 mg/dL (ref 0.55–1.02)
Globulin: 4.8 g/dL — ABNORMAL HIGH (ref 2.0–4.0)
Glucose: 126 mg/dL — ABNORMAL HIGH (ref 65–100)
Potassium: 3.4 mmol/L — ABNORMAL LOW (ref 3.5–5.1)
Protein, total: 7.4 g/dL (ref 6.4–8.2)
Sodium: 136 mmol/L (ref 136–145)
eGFR: >60 mL/min/{1.73_m2} (ref >60)

## 2022-01-11 MED ORDER — SODIUM CHLORIDE 0.9 % IV
INTRAVENOUS | Status: DC | PRN
Start: 2022-01-11 — End: 2022-01-12

## 2022-01-11 MED ORDER — HEPARIN, PORCINE (PF) 100 UNIT/ML IV SYRINGE
100 unit/mL | INTRAVENOUS | Status: DC | PRN
Start: 2022-01-11 — End: 2022-01-12
  Administered 2022-01-11: 21:00:00

## 2022-01-11 MED ORDER — SODIUM CHLORIDE 0.9% BOLUS IV
0.9 % | Freq: Once | INTRAVENOUS | Status: AC | PRN
Start: 2022-01-11 — End: 2022-01-11
  Administered 2022-01-11: 19:00:00 via INTRAVENOUS

## 2022-01-11 MED ORDER — SODIUM CHLORIDE 0.9 % IJ SYRG
INTRAMUSCULAR | Status: DC | PRN
Start: 2022-01-11 — End: 2022-01-12
  Administered 2022-01-11: 21:00:00 via INTRAVENOUS

## 2022-01-11 MED ORDER — OVERFILL VOLUME
25 mg/mL | Freq: Once | INTRAVENOUS | Status: AC
Start: 2022-01-11 — End: 2022-01-11
  Administered 2022-01-11: 20:00:00 via INTRAVENOUS

## 2022-01-11 MED FILL — BD POSIFLUSH NORMAL SALINE 0.9 % INJECTION SYRINGE: INTRAMUSCULAR | Qty: 40

## 2022-01-11 MED FILL — SODIUM CHLORIDE 0.9 % IV: INTRAVENOUS | Qty: 500

## 2022-01-11 MED FILL — SODIUM CHLORIDE 0.9 % IV: INTRAVENOUS | Qty: 1000

## 2022-01-11 NOTE — Progress Notes (Signed)
 [facility] Chemo Progress Note    Date: January 11, 2022        1250: Dawn Green Arrived to [facility] for  C9 Keytruda + Hydration via wheelchair in stable condition.  Assessment was completed and port accessed by Melia Bares RN. Labs drawn and sent for processing. Went to provider appointment with Medical Oncology.    1445: Returned from provider appointment. Labs reviewed. Criteria for treatment was met.    Ms. Turcott's vitals were reviewed.  Patient Vitals for the past 12 hrs:   Temp Pulse Resp BP SpO2   01/11/22 1257 98.8 F (37.1 C) 92 16 (!) 144/63 95 %       Lab results were obtained and reviewed.  Recent Results (from the past 12 hour(s))   TSH 3RD GENERATION    Collection Time: 01/11/22  1:05 PM   Result Value Ref Range    TSH 1.80 0.36 - 3.74 uIU/mL   CBC WITH AUTOMATED DIFF    Collection Time: 01/11/22  1:05 PM   Result Value Ref Range    WBC 13.6 (H) 3.6 - 11.0 K/uL    RBC 3.91 3.80 - 5.20 M/uL    HGB 10.1 (L) 11.5 - 16.0 g/dL    HCT 67.9 (L) 64.9 - 47.0 %    MCV 81.8 80.0 - 99.0 FL    MCH 25.8 (L) 26.0 - 34.0 PG    MCHC 31.6 30.0 - 36.5 g/dL    RDW 85.8 88.4 - 85.4 %    PLATELET 487 (H) 150 - 400 K/uL    MPV 9.0 8.9 - 12.9 FL    NRBC 0.0 0 PER 100 WBC    ABSOLUTE NRBC 0.00 0.00 - 0.01 K/uL    NEUTROPHILS 61 32 - 75 %    LYMPHOCYTES 17 12 - 49 %    MONOCYTES 12 5 - 13 %    EOSINOPHILS 9 (H) 0 - 7 %    BASOPHILS 0 0 - 1 %    IMMATURE GRANULOCYTES 1 (H) 0.0 - 0.5 %    ABS. NEUTROPHILS 8.4 (H) 1.8 - 8.0 K/UL    ABS. LYMPHOCYTES 2.3 0.8 - 3.5 K/UL    ABS. MONOCYTES 1.6 (H) 0.0 - 1.0 K/UL    ABS. EOSINOPHILS 1.2 (H) 0.0 - 0.4 K/UL    ABS. BASOPHILS 0.0 0.0 - 0.1 K/UL    ABS. IMM. GRANS. 0.1 (H) 0.00 - 0.04 K/UL    DF SMEAR SCANNED      RBC COMMENTS NORMOCYTIC, NORMOCHROMIC     METABOLIC PANEL, COMPREHENSIVE    Collection Time: 01/11/22  1:05 PM   Result Value Ref Range    Sodium 136 136 - 145 mmol/L    Potassium 3.4 (L) 3.5 - 5.1 mmol/L    Chloride 102 97 - 108 mmol/L    CO2 30 21 - 32 mmol/L    Anion gap 4 (L) 5 - 15 mmol/L     Glucose 126 (H) 65 - 100 mg/dL    BUN 21 (H) 6 - 20 MG/DL    Creatinine 9.39 9.44 - 1.02 MG/DL    BUN/Creatinine ratio 35 (H) 12 - 20      eGFR >60 >60 ml/min/1.32m2    Calcium 10.7 (H) 8.5 - 10.1 MG/DL    Bilirubin, total 0.6 0.2 - 1.0 MG/DL    ALT (SGPT) 28 12 - 78 U/L    AST (SGOT) 20 15 - 37 U/L    Alk. phosphatase 78 45 - 117  U/L    Protein, total 7.4 6.4 - 8.2 g/dL    Albumin 2.6 (L) 3.5 - 5.0 g/dL    Globulin 4.8 (H) 2.0 - 4.0 g/dL    A-G Ratio 0.5 (L) 1.1 - 2.2          Pre-medications  were administered as ordered and chemotherapy was initiated.  Medications Administered       heparin (porcine) pf 500 Units       Admin Date  01/11/2022 Action  Given Dose  500 Units Route  InterCATHeter Administered By  Kaylin, Elizabeth              pembrolizumab [facility]) 200 mg in 0.9% sodium chloride 100 mL, overfill volume 10 mL IVPB       Admin Date  01/11/2022 Action  New Bag Dose  200 mg Rate  236 mL/hr Route  IntraVENous Administered By  Kaylin, Elizabeth              sodium chloride (NS) flush 5-40 mL       Admin Date  01/11/2022 Action  Given Dose  10 mL Route  IntraVENous Administered By  Kaylin, Elizabeth              sodium chloride 0.9 % bolus infusion 500 mL       Admin Date  01/11/2022 Action  New Bag Dose  500 mL Rate  666.7 mL/hr Route  IntraVENous Administered By  Kaylin, Elizabeth               Two nurses verified prior to administering: Drug name, Drug dose, Infusion volume or drug volume when prepared in a syringe, Rate of administration, Route of administration, Expiration dates and/or times, Appearance and physical integrity of the drugs, Rate set on infusion pump, when used, and Sequencing of drug administration.    1640: Patient tolerated treatment well.  Port maintained positive blood return throughout treatment. Port flushed, heparinized and de accessed per protocol. Patient was discharged in stable condition. Patient is aware of next scheduled [facility] appointment on 02/01/22.    Future  Appointments   Date Time Provider Department Center   02/01/2022  1:00 PM SS INF1 CH4 <2H [facility]   02/01/2022  1:15 PM Raddin, Bernardino RAMAN, MD [facility]   02/22/2022  1:00 PM SS INF1 CH4 <2H [facility]   03/18/2022  1:45 PM Lobb, Emelia DEL, DO [facility]   04/29/2022  2:20 PM Doloresco, Oneil, MD [facility]       Elizabeth Kaylin, RN  January 11, 2022

## 2022-01-11 NOTE — Telephone Encounter (Addendum)
[facility] [employer] at [place]  [phone]    01/11/22- Per Dr.Raddin guardant 360 ordered today- new blood sample obtained at office visit. New sample and test requisition form mailed via [employer]. Pick up #[id number]  tracking # [id number].    01/17/22- Test in progress- No ETA noted online portal.     Results scanned in under media.

## 2022-01-11 NOTE — Telephone Encounter (Signed)
 Per Dr. Jodene, order placed for [facility] eval and assessment for skilled nursing needs, PT/OT for weakness, fatigue, and falls.     Kristan Jacobson RN  Palliative Medicine

## 2022-01-11 NOTE — Progress Notes (Signed)
 Dawn Green is a 81 y.o. female follow up for lung cancer.    1. Have you been to the ER, urgent care clinic since your last visit?  Hospitalized since your last visit?no     2. Have you seen or consulted any other health care providers outside of the [facility] since your last visit?  Include any pap smears or colon screening. No    Vitals 01/11/2022   Blood Pressure 144/63   Pulse 92   Temp 98.8   Resp 16   Height 5' 3   Weight 125 lb 6.4 oz   SpO2 95   BSA 1.59 m2   BMI 22.21 kg/m2

## 2022-01-12 NOTE — Telephone Encounter (Signed)
Records and order [facility]ed to [facility] for [facility] nursing care and OT/PT eval with verification of receipt.     Coralie Carpen RN  Palliative Medicine

## 2022-01-12 NOTE — Telephone Encounter (Signed)
 Received call from the patient's spouse asking to speak to Dawn Green , he asking if you can call him at 3:30 today.

## 2022-01-12 NOTE — Telephone Encounter (Signed)
 Palliative Medicine  Nursing Note  [phone])  Fax [phone]      Telephone Call  Patient Name: Dawn Green  Date of Birth: 1941-08-04    01/12/2022        Primary Decision Maker: Rakisha, [place] - Spouse - [phone]   Advance Care Planning 01/11/2022   Patient's Healthcare Decision Maker is: Named in scanned ACP document   Confirm Advance Directive Yes, on file     Call returned to Mr. Bures.  He needed to cancel the 10:30 am appt with Dr. Jodene on 02/01/22 due the gap in time with Infusion and Oncology that day. Will look into having a sooner appt virtually due to the starting Ritalin and then try to see her in person 02/22/22 when she has Infusion and meets with Oncology.     He shared that she had 2 red and very tender pressure point areas bilaterally just below each buttock which caused her much pain. He treated them with antibiotic ointment and lotion and had her sleep in the bed and not in the recliner.  He said today they are pink and on the mend.  Recommended purchasing a thick skin protectant cream should he notice that area begin to redden or break down.  He verbalized understanding and was appreciative.       Kristan Jacobson, RN  Palliative Medicine

## 2022-01-17 ENCOUNTER — Inpatient Hospital Stay: Admit: 2022-01-17 | Payer: MEDICARE | Primary: Family Medicine

## 2022-01-17 NOTE — Telephone Encounter (Signed)
Noted  

## 2022-01-17 NOTE — Telephone Encounter (Signed)
Linda with [facility] called to informed Dr.Boothe that order for [place] [facility] was received and care started on Friday.

## 2022-01-20 MED ORDER — DOCUSATE SODIUM 100 MG CAP
100 mg | ORAL_CAPSULE | Freq: Two times a day (BID) | ORAL | 0 refills | Status: DC | PRN
Start: 2022-01-20 — End: 2022-01-26

## 2022-01-20 NOTE — Telephone Encounter (Signed)
Patients husband requesting a new order for docusate 100 mg for periodic bouts of constipation.

## 2022-01-20 NOTE — Telephone Encounter (Signed)
Received call from patient's spouse requesting Docusate 100 mg refill. Patient has 60 pills remaining. To be called in to [place]

## 2022-01-21 NOTE — Telephone Encounter (Signed)
Called patient to advise/confirm upcoming vv appt with Dr. Rebecca Eaton on 01/26/22  at 3:30 at virtual.  Spoke with Dawn Green and confirmed appointment.

## 2022-01-24 NOTE — Progress Notes (Signed)
Home Health Certification review    Initial certification: 01/08/39    Certification period: 10/18/70-5/36/64    CCN (CMS Certification Number): [id number]    Company: [employer] [facility]    Diagnosis code: C34.91      I have reviewed and agree with plan of care.      [name], MD

## 2022-01-25 ENCOUNTER — Encounter: Payer: MEDICARE | Attending: Internal Medicine | Primary: Family Medicine

## 2022-01-26 ENCOUNTER — Telehealth: Admit: 2022-01-26 | Discharge: 2022-01-26 | Payer: MEDICARE | Attending: Internal Medicine | Primary: Family Medicine

## 2022-01-26 ENCOUNTER — Telehealth: Attending: Internal Medicine | Primary: Family Medicine

## 2022-01-26 DIAGNOSIS — R5383 Other fatigue: Secondary | ICD-10-CM

## 2022-01-26 MED ORDER — DOCUSATE SODIUM 100 MG CAP
100 mg | ORAL_CAPSULE | Freq: Two times a day (BID) | ORAL | 0 refills | Status: DC | PRN
Start: 2022-01-26 — End: 2022-02-01

## 2022-01-26 MED ORDER — METHYLPHENIDATE 5 MG TAB
5 mg | ORAL_TABLET | Freq: Every morning | ORAL | 0 refills | Status: AC
Start: 2022-01-26 — End: 2022-02-25

## 2022-01-26 MED FILL — KEYTRUDA 25 MG/ML INTRAVENOUS SOLUTION: 25 mg/mL | INTRAVENOUS | Qty: 8

## 2022-01-26 NOTE — Progress Notes (Signed)
Progress Notes by Dawn Copa, MD at 01/26/22 1530                Author: [name], MD  Service: --  Author Type: Physician       Filed: 01/26/22 1613  Encounter Date: 01/26/2022  Status: Signed          Editor: Dawn Copa, MD (Physician)               [name]   Date of Birth: 01/06/1941      Date of Current Visit: 01/26/22    Location of Current Visit:     []  [place] [facility]   []  [place] [facility]   []  [place] [facility]   []  Home   [x] Synchronous real-time A/V virtual visit      Date of Initial Visit:  11/23/21    Referral from: Dawn Butts Raddin MD   Primary Care Physician: Dawn Grew, DO          SUMMARY:     Dawn Green is a 81 y.o. 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 Alphonzo Green for symptom management.  She was diagnosed in 05/2021 and has been undergoing treatment with [facility] with recent scans showing response. Her history is also notable for localized right breast cancer s/p lumpectomy  2014 followed by hormonal therapy for 5 years with no evidence of disease recurrence.      The patients social history includes: she's married to Dawn Green. She and her husband live with their son, Dawn Green, who has schizoaffective disorder, and their beloved shih Dawn Green, Dawn Green, in [place]. They have another son, Dawn Green, who lives locally. Their 3rd  son died a few years ago. Her husband is a retired Clinical biochemist who was career [employer]      Palliative Medicine is addressing the following current patient/family concerns: symptoms/issues related to lung cancer diagnosis/treatment.      Initial Referral Intake note reviewed      PALLIATIVE DIAGNOSES:                  ICD-10-CM  ICD-9-CM             1.  Fatigue, unspecified type   R53.83  780.79  methylphenidate HCl (RITALIN) 5 mg  tablet                  2.  Mild episode of recurrent major depressive disorder (HCC)   F33.0  296.31                    3.  Weakness generalized   R53.1  780.79                    4.  Drug-induced constipation   K59.03  564.09  docusate sodium (COLACE) 100 mg capsule              E980.5                    5.  Poor appetite   R63.0  783.0                    6.  Swallowing problem   R13.10  787.20  7.  Metastatic non-small cell lung cancer (HCC)   C34.90  162.9  methylphenidate HCl (RITALIN) 5 mg tablet                              PLAN:          Patient Instructions     Dear Dawn Green ,      It was a pleasure seeing you today via synchronous real time A/V visit.      We will see you again in 3 weeks for an office visit to coordinate with your return to the infusion center.      If labs or imaging tests have been ordered for you today, please call the office  at [phone] 48 hours after completion to obtain the results.        Your stated goal: to improve energy and regain strength      Your described symptoms were:      Fatigue: 7  Drowsiness: 5     Depression: 0  Pain: 7     Anxiety: 0  Nausea: 0     Anorexia: 8  Dyspnea: 0     Best Well-Being: 4  Constipation: No        Other Problem (Comment):  (Significant memory loss, difficulty swallowing,)           This is the plan we talked about:      1. Fatigue   -At prior visits, we discussed the multiple factors may be affecting your energy, including your mood, your poor sleep, your reduced activity and progressive weakness as well as some of your medications  (hydrocodone, amitriptyline, immunotherapy)   -Increase Ritalin 5-mg to 2 tabs every morning      2. Depression   -You don't feel well physically but are not depressed or worried   -Continue sertraline 50-mg daily       3. Weakness   -You're feeling somewhat stronger though continue to require your husband's help    -You are now receiving services through [facility]       4. Poor  appetite   -You've lost about 15 pounds since last August with your weight now stabilized in the 127-130 pound range      5. Pain   -You continue to have pain in your left chest area related to your cancer   -You also have discomfort "in your bottom" from your sacral wound   -Continue ibuprofen 600-mg every 6 hours as needed   -Continue use of lidocaine patches which bring relief   -Continue hydrocodone-acetaminophen 5-325-mg every 8 hour as needed      6. Constipation    -You're moving your bowels regularly taking colace three times/week      7. Swallowing problems   -Today I placed a referral for Speech Therapy through [facility]      8. Lung cancer   -You're being treated with immune therapy under the care of Dr. Alphonzo Green               This is what you have shared with [place] about Advance Care Planning:        Primary Decision Maker: Dawn, Green - [phone]       Advance Care Planning  01/26/2022        Patient's [place] is:  Named in scanned ACP document  Confirm Advance Directive  Yes, on file                 The Palliative Medicine Team is here to support you and your family.          Sincerely,         Dawn Green, Dawn Morgan, MD and the Palliative Medicine Team          GOALS OF CARE / TREATMENT PREFERENCES:     [====Goals of Care====]   GOALS OF CARE:   Patient / health care proxy stated goals: See Patient Instructions / Summary      TREATMENT PREFERENCES:    Code Status:  [x]   Attempt Resuscitation       []   Do Not Attempt Resuscitation      Advance Care Planning:   [x]  The Pall Med Interdisciplinary Team has updated  the ACP Navigator with Decision Maker and Patient Capacity        Primary Decision MakerThaila, Green - [phone]       Advance Care Planning  01/26/2022        Patient's Healthcare Decision Maker is:  Named in scanned ACP document        Confirm Advance Directive  Yes, on file           Other:   (If patient appropriate for POST, consider using  PALLPOST smart phrase here)      The palliative care team has discussed with patient / health care proxy about goals of care / treatment preferences for patient.   [====Goals of Care====]         PRESCRIPTIONS GIVEN:          Medications Ordered Today       Medications        ?  methylphenidate HCl (RITALIN) 5 mg tablet             Sig: Take 2 Tablets by mouth Every morning for 30 days. Max Daily Amount: 10 mg.         Dispense:  60 Tablet         Refill:  0        ?  docusate sodium (COLACE) 100 mg capsule             Sig: Take 1 Capsule by mouth two (2) times daily as needed for Constipation for up to 90 days.         Dispense:  60 Capsule             Refill:  0                        FOLLOW UP:          Future Appointments           Date  Time  Provider  [place]           02/01/2022   1:00 PM  SS INF1 CH4 <2H  [facility]     02/01/2022   1:15 PM  Green, Reuel Derby, MD  [facility]  [facility]     02/22/2022   1:00 PM  SS INF1 CH4 <2H  [facility]     03/18/2022   1:45 PM  Lobb, Laray Anger, DO  [facility]  [facility]           04/29/2022   2:20 PM  Doloresco, Elta Guadel[place], MD  [facility]  [facility]                  PHYSICIANS INVOLVED IN CARE:     Patient Care Team:   Dawn Grew, DO as PCP - General (Family Medicine)   Dawn Grew, DO as PCP - [facility] Empaneled Provider   Green, Reuel Derby, MD (Hematology and Oncology)   Idalia Needle, MD (Radiation Oncology)   Mechele Collin, MD (Neurosurgery)   Rebecca Eaton, Dawn Morgan, MD (Palliative Medicine)            HISTORY:     Reviewed patient-completed ESAS and advance care planning form.   Reviewed patient record in prescription monitoring program.      CHIEF COMPLAINT:      Chief Complaint       Patient presents with        ?  Fatigue        She hasn't had nay improvement in her fatigue with low-dose Ritalin.   She continues to have pain "on my left side."   She usually takes 600-mg ibuprofen, takes hydrocodone maybe 3 times/week (per her preference to avoid opioids).   Her husband's noticed it  takes her a long time to swallow pills, no choking or coughing.   At [facility] continues to come to the home, nurse comes twice/week and PT/OT have come in as well.      See Plan/Patient Instructions for additional interval history      From IV 11/23/21:   She has no energy.   She feels weak and tired all the time.   She can't do the things she liked to do when she was feeling better, like shopping, going to the grocery store, driving.   She spends all of her time in her recliner, she sleeps in her recliner.   Her recliner is in her bedroom, which is on the 2nd floor of her home.   She sits in her recliner and watches TV during the day.   She used to like to read but hasn't read anything in a long time.   She rarely goes downstairs except to leave the house to go to doctor's appointments or the infusion center.   Her legs start to shake and she needs help going up and down the stairs.   She's fallen several times over the past months.      Getting her lung cancer diagnosis was quite a blow, it just came out of the blue.   She was diagnosed with breast cancer in 2014 and was cured of that.   She had been coughing for a month or so and it didn't improve with OTC medications.   Her husband took her to the ED where a chest X-ray showed a mass.   She started treatment with immune therapy last August and her last scans showed the cancer was shrinking.   Her adult son, Dawn Green, who has mental health issues, lives with her and her husband, has lived with them all of his life.   This can be stressful at times.      She and her husband are members of a local [employer].   They had been very active in the church before her lung cancer diagnosis.   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      She doesn't have much  of an appetite.   She's lost ~ 30# in the last year but her weight seems to now be stabilized  ~130#.      She's constipated at times.      She's had an achy pain in her left upper arm and shoulder for a month or so.   She had X-rays done which showed no fractures or bone lesions.   She tended to fall towards the left when she sustained her falls.   She also leans towards the left when she sleeps in her recliner.   She's been using a lidocaine patch which helps and takes 1 to 2 hydrocodone daily.            HPI/SUBJECTIVE:     The patient is: [x]  Verbal / []  Nonverbal          Clinical Pain Assessment (nonverbal scale for nonverbal patients):    [++++ Clinical Pain Assessment++++]   [++++Pain Severity++++]: Pain: 7   [++++Pain Character++++]: ache   [++++Pain Duration++++]: month   [++++Pain Effect++++]:   [++++Pain Factors++++]: unable to identify provoking factors; lidocaine patch helps, hydrocodone helps   [++++Pain Frequency++++]: constant with varying intensity   [++++Pain Location++++]: left shoulder   [++++ Clinical Pain Assessment++++]            FUNCTIONAL ASSESSMENT:        Palliative Performance Scale (PPS):   PPS: 70              PSYCHOSOCIAL/SPIRITUAL SCREENING:        Any spiritual / religious concerns:   []  Yes /  [x]  No      Caregiver Burnout:   []  Yes /  [x]  No /  []   No Caregiver Present        Anticipatory grief assessment:    [x]  Normal  / []  Maladaptive         ESAS Anxiety: Anxiety: 0      ESAS Depression: Depression: 0             REVIEW OF SYSTEMS:        The following systems were [x]  reviewed / []  unable to be reviewed   Systems: constitutional, ears/nose/mouth/throat, respiratory, gastrointestinal, genitourinary, musculoskeletal, integumentary, neurologic, psychiatric, endocrine. Positive findings noted below.      Modified ESAS  Completed by: provider     Fatigue: 7  Drowsiness: 5     Depression: 0  Pain: 7     Anxiety: 0  Nausea: 0     Anorexia: 8  Dyspnea: 0     Best Well-Being: 4  Constipation: No        Other Problem (Comment):  (Significant memory loss, difficulty swallowing,)                  PHYSICAL EXAM:          Wt Readings from Last 3 Encounters:        01/11/22  125 lb 6.4 oz (56.9 kg)     01/11/22  125 lb (56.7 kg)        12/21/21  127 lb 3.2 oz (57.7 kg)        There were no vitals taken for this visit.   Last bowel movement: See Nursing Note      Constitutional     []  Appears well-developed and well-nourished in no  apparent distress     [x]  Abnormal: appears ill, fatigued   Mental status   [x]  Alert and awake   [  x] Oriented to person/place/time   [x]  Able to follow commands   []  Abnormal:    Eyes   [x]  EOM normal    [x]  Sclera normal    [x]  No visible ocular discharge   []  Abnormal:    HENT   [x]  Normocephalic, atraumatic   [x]  Mouth/Throat: Moist mucous membranes    [x]  External Ears normal   []  Abnormal:   Neck   [x]  No visualized mass   []  Abnormal:   Pulmonary/Chest    [x]  Respiratory effort normal   [x]  No visualized signs of difficulty breathing or  respiratory distress   []  Abnormal:   Musculoskeletal   []  Normal gait with no signs of ataxia   [x]  Normal range of motion of neck   [x]  Abnormal: - sitting due to weakness   Neurological:    [x]  No facial asymmetry (Cranial nerve 7 motor function)   [x]  No gaze palsy   []  Abnormal:    Skin   [x]  No significant exanthematous lesions or discoloration  noted on facial skin   []  Abnormal:                                    Psychiatric   [x]  Normal affect   [x]  No hallucinations   []  Abnormal:      Other pertinent observable physical exam findings:      Due to this being a TeleHealth evaluation, many elements of the physical examination are unable to be assessed.                  HISTORY:          Past Medical History:        Diagnosis  Date         ?  Breast CA ([place])  2014          Right - Lumpectomy          ?  Diabetes ([place])       ?  Glaucoma       ?  Hx of mammogram  02/05/2016          Negative per pt. Sees Dr. Laurena Bering          ?  Hyperlipemia       ?  Hypertension       ?  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  Other ill-defined conditions(799.89)  2016          Mild URI x 4 months (allergies)          ?  Radiation therapy complication  9678     ?  Routine Papanicolaou smear  1996          Negative per pt.          ?  Skin cancer            Basal cell on left side of nose and upper lip           Past Surgical History:         Procedure  Laterality  Date          ?  HX BREAST BIOPSY  Left            x 2 negative biopsies           ?  HX BREAST LUMPECTOMY  Right  2012/12/17     ?  HX CATARACT REMOVAL  Bilateral            w/ IOL implants          ?  HX TAH AND BSO    1997          for rapidly growing fibroid (JW) - benign.  Dr. Oran Rein.          ?  [place]     ?  IR INSERT TUNL CVC W PORT OVER 5 YEARS    07/23/2021     ?  PR UNLISTED PROCEDURE BREAST  Right  12/17/12          Right breast lumpectomy           Family History         Problem  Relation  Age of Onset          ?  Cancer  Mother  12              Ovarian - Passed away in Dec 17, 1964          ?  Ovarian Cancer  Mother  6     ?  Other  Son  12              Pancreatitis           ?  Cancer  Paternal Aunt  89              Pancreatic         History reviewed, no pertinent family history.     Social History          Tobacco Use         ?  Smoking status:  Former              Packs/day:  1.00         Years:  30.00         Pack years:  30.00         Types:  Cigarettes         Quit date:  04/17/1996         Years since quitting:  25.7         ?  Smokeless tobacco:  Never        ?  Tobacco comments:             Never used vapor or e-cigs        Substance Use Topics         ?  Alcohol use:  Yes              Alcohol/week:  11.7 standard drinks         Types:  14 Standard drinks or equivalent per week             Comment: 1 gin and tonics per day          Allergies        Allergen  Reactions         ?  Metformin  Nausea Only             Stomach upset           Current Outpatient Medications        Medication  Sig         ?  methylphenidate HCl (RITALIN) 5 mg  tablet  Take 2  Tablets by mouth Every morning for 30 days. Max Daily Amount: 10 mg.     ?  docusate sodium (COLACE) 100 mg capsule  Take 1 Capsule by mouth two (2) times daily as needed for Constipation for up to 90 days.     ?  sertraline (ZOLOFT) 50 mg tablet  Take 1 Tablet by mouth daily.     ?  lidocaine (LIDODERM) 5 %  1 Patch by TransDERmal route as needed for Pain. left shoulder pain as needed one patch every 12hrs     ?  HYDROcodone-acetaminophen (NORCO) 5-325 mg per tablet  Take 1 Tablet by mouth every eight (8) hours as needed for Pain.     ?  guaiFENesin-codeine (ROBITUSSIN AC) 100-10 mg/5 mL solution  Take 2 mL by mouth as needed for Cough.     ?  chlorphen/DM/acetaminophen/gg (CORICIDIN HBP DAY-NIGHT PO)  Take 15 mL by mouth as needed for Cough.     ?  lidocaine-prilocaine (EMLA) topical cream  Apply  to affected area as needed for Pain (Apply 30-60 min. prior to having your port accessed).     ?  olmesartan (BENICAR) 20 mg tablet  Take 1 Tablet by mouth in the morning. (Patient taking differently: Take 0.5 Tablets by mouth daily.)     ?  amitriptyline (ELAVIL) 10 mg tablet  Take 1 Tablet by mouth nightly.     ?  alendronate (FOSAMAX) 35 mg tablet  TAKE 1 TABLET EVERY 7 DAYS     ?  atorvastatin (LIPITOR) 40 mg tablet  TAKE 1 TABLET DAILY BEFORE BREAKFAST     ?  brimonidine-timoloL (COMBIGAN) 0.2-0.5 % drop ophthalmic solution  Administer 1 Drop to both eyes every twelve (12) hours.         ?  aspirin 81 mg chewable tablet  Take 1 Tablet by mouth every Monday, Wednesday, Friday.          No current facility-administered medications for this visit.                 LAB DATA REVIEWED:          Lab Results         Component  Value  Date/Time            WBC  13.6 (H)  01/11/2022 01:05 PM       HGB  10.1 (L)  01/11/2022 01:05 PM            PLATELET  487 (H)  01/11/2022 01:05 PM          Lab Results         Component  Value  Date/Time            Sodium  136  01/11/2022 01:05 PM       Potassium  3.4 (L)   01/11/2022 01:05 PM       Chloride  102  01/11/2022 01:05 PM       CO2  30  01/11/2022 01:05 PM       BUN  21 (H)  01/11/2022 01:05 PM       Creatinine  0.60  01/11/2022 01:05 PM            Calcium  10.7 (H)  01/11/2022 01:05 PM           Lab Results         Component  Value  Date/Time            Alk. phosphatase  78  01/11/2022 01:05 PM       Protein, total  7.4  01/11/2022 01:05 PM       Albumin  2.6 (L)  01/11/2022 01:05 PM            Globulin  4.8 (H)  01/11/2022 01:05 PM        No results found for: INR, PTMR, PTP, PT1, PT2, APTT, INREXT, INREXT    No results found for: IRON, FE, TIBC, IBCT, PSAT, FERR       CT chest/abdomen/pelvis 09/28/21:   Diminished size of lymphadenopathy.   Diminished size of lingular mass.   No acute intraperitoneal/intrathoracic process is identified.       MRI bran 10/21/21:   1. Mild interval decrease in size of left parietal hemorrhagic lesion, presumed   metastasis, though without any demonstrable enhancement on this examination.   Minimal surrounding edema, without significant mass effect.   2. No additional intracranial metastases identified. No acute intracranial   abnormality.   3. Unchanged generalized parenchymal volume loss and mild chronic microvascular   ischemic disease.      X-ray left shoulder 11/09/21:   No acute abnormality.      X-ray left humerus 11/09/21:   No acute abnormality.      CT chest/abdomen/pelvis 01/06/22:   1.  Increased size of the left upper lobe lung mass now occluding the lingular   bronchus resulting in further lingular atelectasis. Increased hilar extension of   the primary mass.   2.  Stable mediastinal lymphadenopathy.   3.  No evidence of metastatic disease in the abdomen or pelvis.   4.  Stable trace left pleural effusion.      Bone scan 01/06/22:   No active skeletal metastases.             CONTROLLED SUBSTANCES SAFETY ASSESSMENT (IF ON  CONTROLLED SUBSTANCES):        Reviewed opioid safety handout:  []  Yes   []  No   24 hour opioid dose >173m  morphine equivalent/day:  []   Yes   []  No   Benzodiazepines:  []   Yes   []  No   Sleep apnea:  []   Yes   []  No   Urine Toxicology Testing within last 6 months:  []   Yes   []  No   History of or new aberrant medication taking behaviors:  []   Yes   []  No   Has Narcan been prescribed []  Yes   []  No                 Total time:    Counseling / coordination time:    > 50% counseling / coordination?:        MAilene Green was evaluated through a synchronous (real-time) audio-video encounter. The patient (or guardian if applicable) is aware that this is a billable service, which includes applicable  co-pays. This Virtual Visit was conducted with patient's (and/or legal guardian's) consent. The visit was conducted pursuant to the emergency declaration under the [place] authority and the CJohnson [employer] Johnsonand [employer]Act.  Patient identification was verified, and a caregiver was present when appropriate.   The patient was located at: Home: [id number]  The provider was located at: Palliative Medicine [id number]        --[place]  Doug Sou, MD on 01/26/2022 at 1:58 PM      An electronic signature was used to authenticate this note.

## 2022-01-26 NOTE — Progress Notes (Signed)
Palliative Medicine Office Visit  Palliative Medicine Nurse Check In  [phone]) [phone])    Patient Name: Dawn Green  Date of Birth: 10-28-1940      Date of Office Visit: 01/26/2022    Patient states: "  "    From Check In Sheet (scanned in Media):  Has a medical provider talked with you about cardiopulmonary resuscitation (CPR)?   [x]  Yes   []  No   []  Unable to obtain    Nurse reminder to complete or update ACP FlowSheet:    Is ACP on the Problem List?    [x]  Yes    []  No  IF ACP Document is ON FILE; Nurse to place ACP on Problem List     Is there an ACP Note in Chart Review/Note?    [x]  Yes    []  No   If NO: ALERT PROVIDER       Primary Decision MakerKhalila, Buechner [phone]  Advance Care Planning 01/26/2022   Patient's [place] is: Named in scanned ACP document   Confirm Advance Directive Yes, on file          Is there anything that we should know about you as a person in order to provide you the best care possible?     Have you been to the ER, urgent care clinic since your last visit?   []  Yes   [x]  No   []  Unable to obtain    Have you been hospitalized since your last visit?   []  Yes   [x]  No   []  Unable to obtain    Have you seen or consulted any other health care providers outside of the [facility] since your last visit?   []  Yes   [x]  No   []  Unable to obtain    Functional status (describe):       Last BM: 01/25/2022    PMP accessed (date):     Bottle review (for opioid pain medication):  Medication 1:   Date filled:   Directions:   # filled:   # left:   # pills taking per day:  Last dose taken:    Medication 2:   Date filled:   Directions:   # filled:   # left:   # pills taking per day:  Last dose taken:    Medication 3:   Date filled:   Directions:   # filled:   # left:   # pills taking per day:  Last dose taken:    Medication 4:   Date filled:   Directions:   # filled:   # left:   # pills taking per day:  Last dose taken:

## 2022-01-28 ENCOUNTER — Encounter

## 2022-02-01 ENCOUNTER — Ambulatory Visit
Admit: 2022-02-01 | Discharge: 2022-02-01 | Payer: MEDICARE | Attending: Hematology & Oncology | Primary: Family Medicine

## 2022-02-01 ENCOUNTER — Encounter: Attending: Internal Medicine | Primary: Family Medicine

## 2022-02-01 ENCOUNTER — Ambulatory Visit: Admit: 2022-02-01 | Discharge: 2022-02-01 | Payer: MEDICARE | Attending: Internal Medicine | Primary: Family Medicine

## 2022-02-01 ENCOUNTER — Inpatient Hospital Stay: Admit: 2022-02-01 | Payer: MEDICARE | Primary: Family Medicine

## 2022-02-01 ENCOUNTER — Ambulatory Visit: Attending: Hematology & Oncology | Primary: Family Medicine

## 2022-02-01 ENCOUNTER — Ambulatory Visit: Attending: Internal Medicine | Primary: Family Medicine

## 2022-02-01 DIAGNOSIS — Z5112 Encounter for antineoplastic immunotherapy: Secondary | ICD-10-CM

## 2022-02-01 DIAGNOSIS — C3490 Malignant neoplasm of unspecified part of unspecified bronchus or lung: Secondary | ICD-10-CM

## 2022-02-01 DIAGNOSIS — R5383 Other fatigue: Secondary | ICD-10-CM

## 2022-02-01 LAB — COMPREHENSIVE METABOLIC PANEL
ALT: 31 U/L (ref 12–78)
AST: 24 U/L (ref 15–37)
Albumin/Globulin Ratio: 0.6 — ABNORMAL LOW (ref 1.1–2.2)
Albumin: 2.7 g/dL — ABNORMAL LOW (ref 3.5–5.0)
Alkaline Phosphatase: 82 U/L (ref 45–117)
Anion Gap: 5 mmol/L (ref 5–15)
BUN: 14 MG/DL (ref 6–20)
Bun/Cre Ratio: 27 — ABNORMAL HIGH (ref 12–20)
CO2: 30 mmol/L (ref 21–32)
Calcium: 11.2 MG/DL — ABNORMAL HIGH (ref 8.5–10.1)
Chloride: 99 mmol/L (ref 97–108)
Creatinine: 0.52 MG/DL — ABNORMAL LOW (ref 0.55–1.02)
ESTIMATED GLOMERULAR FILTRATION RATE: >60 mL/min/{1.73_m2} (ref >60)
Globulin: 4.9 g/dL — ABNORMAL HIGH (ref 2.0–4.0)
Glucose: 132 mg/dL — ABNORMAL HIGH (ref 65–100)
Potassium: 2.9 mmol/L — ABNORMAL LOW (ref 3.5–5.1)
Sodium: 134 mmol/L — ABNORMAL LOW (ref 136–145)
Total Bilirubin: 0.4 MG/DL (ref 0.2–1.0)
Total Protein: 7.6 g/dL (ref 6.4–8.2)

## 2022-02-01 LAB — CBC WITH AUTO DIFFERENTIAL
Basophils %: 0 % (ref 0–1)
Basophils Absolute: 0 10*3/uL (ref 0.0–0.1)
Eosinophils %: 7 % (ref 0–7)
Eosinophils Absolute: 1.1 10*3/uL — ABNORMAL HIGH (ref 0.0–0.4)
Granulocyte Absolute Count: 0.2 10*3/uL — ABNORMAL HIGH (ref 0.00–0.04)
Hematocrit: 33 % — ABNORMAL LOW (ref 35.0–47.0)
Hemoglobin: 10.4 g/dL — ABNORMAL LOW (ref 11.5–16.0)
Immature Granulocytes: 1 % — ABNORMAL HIGH (ref 0.0–0.5)
Lymphocytes %: 11 % — ABNORMAL LOW (ref 12–49)
Lymphocytes Absolute: 1.8 10*3/uL (ref 0.8–3.5)
MCH: 24.9 PG — ABNORMAL LOW (ref 26.0–34.0)
MCHC: 31.5 g/dL (ref 30.0–36.5)
MCV: 78.9 FL — ABNORMAL LOW (ref 80.0–99.0)
MPV: 9.1 FL (ref 8.9–12.9)
Monocytes %: 11 % (ref 5–13)
Monocytes Absolute: 1.8 10*3/uL — ABNORMAL HIGH (ref 0.0–1.0)
NRBC Absolute: 0 10*3/uL (ref 0.00–0.01)
Neutrophils %: 70 % (ref 32–75)
Neutrophils Absolute: 11.2 10*3/uL — ABNORMAL HIGH (ref 1.8–8.0)
Nucleated RBCs: 0 PER 100 WBC
Platelets: 544 10*3/uL — ABNORMAL HIGH (ref 150–400)
RBC: 4.18 M/uL (ref 3.80–5.20)
RDW: 14.4 % (ref 11.5–14.5)
WBC: 16.1 10*3/uL — ABNORMAL HIGH (ref 3.6–11.0)

## 2022-02-01 LAB — CBC WITH AUTOMATED DIFF
ABS. BASOPHILS: 0 10*3/uL (ref 0.0–0.1)
ABS. EOSINOPHILS: 1.1 10*3/uL — ABNORMAL HIGH (ref 0.0–0.4)
ABS. IMM. GRANS.: 0.2 10*3/uL — ABNORMAL HIGH (ref 0.00–0.04)
ABS. LYMPHOCYTES: 1.8 10*3/uL (ref 0.8–3.5)
ABS. MONOCYTES: 1.8 10*3/uL — ABNORMAL HIGH (ref 0.0–1.0)
ABS. NEUTROPHILS: 11.2 10*3/uL — ABNORMAL HIGH (ref 1.8–8.0)
ABSOLUTE NRBC: 0 10*3/uL (ref 0.00–0.01)
BASOPHILS: 0 % (ref 0–1)
EOSINOPHILS: 7 % (ref 0–7)
HCT: 33 % — ABNORMAL LOW (ref 35.0–47.0)
HGB: 10.4 g/dL — ABNORMAL LOW (ref 11.5–16.0)
IMMATURE GRANULOCYTES: 1 % — ABNORMAL HIGH (ref 0.0–0.5)
LYMPHOCYTES: 11 % — ABNORMAL LOW (ref 12–49)
MCH: 24.9 PG — ABNORMAL LOW (ref 26.0–34.0)
MCHC: 31.5 g/dL (ref 30.0–36.5)
MCV: 78.9 FL — ABNORMAL LOW (ref 80.0–99.0)
MONOCYTES: 11 % (ref 5–13)
MPV: 9.1 FL (ref 8.9–12.9)
NEUTROPHILS: 70 % (ref 32–75)
NRBC: 0 PER 100 WBC
PLATELET: 544 10*3/uL — ABNORMAL HIGH (ref 150–400)
RBC: 4.18 M/uL (ref 3.80–5.20)
RDW: 14.4 % (ref 11.5–14.5)
WBC: 16.1 10*3/uL — ABNORMAL HIGH (ref 3.6–11.0)

## 2022-02-01 LAB — METABOLIC PANEL, COMPREHENSIVE
A-G Ratio: 0.6 — ABNORMAL LOW (ref 1.1–2.2)
ALT (SGPT): 31 U/L (ref 12–78)
AST (SGOT): 24 U/L (ref 15–37)
Albumin: 2.7 g/dL — ABNORMAL LOW (ref 3.5–5.0)
Alk. phosphatase: 82 U/L (ref 45–117)
Anion gap: 5 mmol/L (ref 5–15)
BUN/Creatinine ratio: 27 — ABNORMAL HIGH (ref 12–20)
BUN: 14 MG/DL (ref 6–20)
Bilirubin, total: 0.4 MG/DL (ref 0.2–1.0)
CO2: 30 mmol/L (ref 21–32)
Calcium: 11.2 MG/DL — ABNORMAL HIGH (ref 8.5–10.1)
Chloride: 99 mmol/L (ref 97–108)
Creatinine: 0.52 MG/DL — ABNORMAL LOW (ref 0.55–1.02)
Globulin: 4.9 g/dL — ABNORMAL HIGH (ref 2.0–4.0)
Glucose: 132 mg/dL — ABNORMAL HIGH (ref 65–100)
Potassium: 2.9 mmol/L — ABNORMAL LOW (ref 3.5–5.1)
Protein, total: 7.6 g/dL (ref 6.4–8.2)
Sodium: 134 mmol/L — ABNORMAL LOW (ref 136–145)
eGFR: >60 mL/min/{1.73_m2} (ref >60)

## 2022-02-01 MED ORDER — HYDROCODONE-ACETAMINOPHEN 5 MG-325 MG TAB
5-325 mg | ORAL_TABLET | Freq: Four times a day (QID) | ORAL | 0 refills | Status: AC | PRN
Start: 2022-02-01 — End: 2022-02-16

## 2022-02-01 MED ORDER — DOCUSATE SODIUM 100 MG CAP
100 mg | ORAL_CAPSULE | Freq: Two times a day (BID) | ORAL | 5 refills | Status: AC | PRN
Start: 2022-02-01 — End: 2022-05-02

## 2022-02-01 NOTE — Progress Notes (Signed)
Progress  Notes by RaddinReuel Derby, MD at 02/01/22 1315                Author: Heloise Purpura, MD  Service: --  Author Type: Physician       Filed: 02/01/22 1716  Encounter Date: 02/01/2022  Status: Signed          Editor: Marisol Glazer, Reuel Derby, MD (Physician)                       [facility]   [address], [address] [place] [address]   W: [phone]  F: [phone]           Reason for Visit:     Dawn Green is a 81 y.o. female who is seen in follow up for evaluation of lung cancer.        Hematology/Oncology Treatment History:       CT Chest 06/11/2021: Large lingular mass, compatible with neoplasm. Bulky mediastinal  and left hilar adenopathy, compatible with metastatic disease.  Small left pleural effusion, likely malignant.     CT guided lung biopsy 06/25/2021: squamous cell carcinoma, PDL1 80%     CT A/P 07/06/2021: Incompletely visualized 6.4 cm x 4.6 cm lingular soft tissue mass. Trace left pleural fluid.     MRI Brain 07/06/2021: Nonenhancing/minimally enhancing ring lesion of the (hemorrhagic/melanotic) left parietal mass lesion measures 11 x 15 x 16 mm in size. Ring-enhancing lesion with minimal associated  susceptibility. There is no significant midline shift or mass effect. There is no significant associated vasogenic edema. Despite its atypical appearance most likely represents a metastasis, not fully delineated. Mild cerebral atrophy and chronic microvascular  ischemic change. No other evidence of intracranial metastasis is definitively demonstrated.      PET/CT 07/09/2021: Large lingular mass demonstrates increased tracer activity and is compatible with the known neoplasm. Hypermetabolic bilateral  supraclavicular, mediastinal, and left hilar lymphadenopathy. Hypermetabolic bone lesions concerning for metastatic disease.     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 Oletta Darter on 07/21/2021     Initiated palliative  systemic therapy with Pembrolizumab on 07/27/2021        History of Present Illness:     Presents for follow up on therapy. In wheelchair today due to concern for continued fatigue. Has bed sore on buttocks, care through [facility]. Having PT/OT in the home. This  is causing considerable fatigue. Appetite is down significantly. Drank 1/2 bottle of Boost and about 6 bran squares (crackers).       No fever or chills at home. Having right shoulder pain and pain at buttocks from bedsore. Using Hydrocodone PRN.       Had simulation last week Thursday, but no scheduled treatments yet.       She is accompanied by her husband today.  She lives with her husband and son in [place].  She previously smoked 1ppd, quit 25 years before diagnosis.      Review of systems was obtained and pertinent findings reviewed above. Past medical history, social history, family history, medications, and allergies are located in the electronic medical record.        Physical Exam:     Visit Vitals      BP  (!) 143/67     Pulse  (!) 101     Temp  98.8 F (37.1 C)  Resp  18     Ht  5' 3"  (1.6 m)     Wt  118 lb (53.5 kg)     SpO2  94%        BMI  20.90 kg/m              ECOG PS: 4   General: no distress   Eyes: anicteric sclerae   HENT: oropharynx clear   Neck: supple   Lymphatic: no cervical, supraclavicular, or inguinal adenopathy   Respiratory: normal respiratory effort   CV: no peripheral edema   GI: soft, nontender, nondistended, no masses   Skin: no rashes, no ecchymoses, no petechiae   Unstable gait        Results:          Lab Results         Component  Value  Date/Time            WBC  16.1 (H)  02/01/2022 01:06 PM       HGB  10.4 (L)  02/01/2022 01:06 PM       HCT  33.0 (L)  02/01/2022 01:06 PM       PLATELET  544 (H)  02/01/2022 01:06 PM       MCV  78.9 (L)  02/01/2022 01:06 PM            ABS. NEUTROPHILS  11.2 (H)  02/01/2022 01:06 PM          Lab Results         Component  Value  Date/Time            Sodium  134 (L)  02/01/2022  01:06 PM       Potassium  2.9 (L)  02/01/2022 01:06 PM       Chloride  99  02/01/2022 01:06 PM       CO2  30  02/01/2022 01:06 PM       Glucose  132 (H)  02/01/2022 01:06 PM       BUN  14  02/01/2022 01:06 PM       Creatinine  0.52 (L)  02/01/2022 01:06 PM       GFR est AA  >60  06/11/2021 10:14 AM       GFR est non-AA  >60  06/11/2021 10:14 AM       Calcium  11.2 (H)  02/01/2022 01:06 PM       Glucose (POC)  137 (H)  07/23/2021 12:58 PM            Creatinine (POC)  0.8  03/26/2013 01:52 PM          Lab Results         Component  Value  Date/Time            Bilirubin, total  0.4  02/01/2022 01:06 PM       ALT (SGPT)  31  02/01/2022 01:06 PM       Alk. phosphatase  82  02/01/2022 01:06 PM       Protein, total  7.6  02/01/2022 01:06 PM       Albumin  2.7 (L)  02/01/2022 01:06 PM            Globulin  4.9 (H)  02/01/2022 01:06 PM        CT C/A/P 10/06/2021:   Imaging findings are consistent with interval response to therapy. Diminished size of lymphadenopathy.   Diminished size of lingular mass.  No acute intraperitoneal/intrathoracic process is identified.         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  Imaging notes extensive disease within her left lung, bilateral supraclavicular and mediastinal nodes, bones, and brain.  Her cancer is  not curable and management is with palliative intent.      Due to high PDL1, she initiated therapy with single agent pembrolizumab.        Tolerating therapy with mild itching and increased fatigue. Recent CT imaging with progression in left upper lobe mass. The plan was made to continue with immunotherapy and refer to RadOnc. She met with Dr. Bethena Roys and had simulation last week. Planning  to start radiation in the next 1-2 weeks. Will HOLD Pembrolizumab today due to concern for increased toxicity with combination therapy. Resume Pembrolizumab after radiation completed.        Discussed today concern for her overall poor PS and poor quality of life. Discussed option of supportive care alone with hospice services. Patient and husband would like to proceed with radiation as planned. Further discuss resuming Pembrolizumab after  radiation therapy.       Guardant 360 resulted with no actionable mutations. FoundationOne was unable to be run on sample.        2) Brain metastasis   Isolated metastasis on MRI.  She was seen by Dr. Oletta Darter (neurosurgery) and underwent gamma knife on 10/5. MRI done at time of procedure with 2 new lesions, uncertain if cancer lesions vs infarcts.      Repeat MRI on 10/21/2020 stable. Due for follow up again next week.      3) Cough   Improving. Taking cough syrup PRN. Hydrocodone PRN      4) H/o breast cancer   S/p lumpectomy, radiation, and endocrine therapy.      5) Weight loss   Dietician supportive phone call on 07/16/2021.      Worsening likely due to cancer progression.       7) Hypotension   Seen by cardiology, had change in BP medications (reduced). They attempted to stop it, but BP went too high. Continue NS bolus day of therapy PRN.       8) Recurrent falls   They have had stair lift installed in home. Having PT/OT in the home currently.       9) Sacral wound   Patient's husband reports pressure sore to sacrum. Having home health evaluation, improving.       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 Thyroid studies are normal. Cortisol normal. Seeing cardiology-likely hypotension contributing. ECHO normal.       No improvement on Ritalin per palliative medicine.       Spending majority of time in the bed at home.       11) Left shoulder/flank pain   Xray of left shoulder and humerus unremarkable. Hydrocodone PRN. Bone scan obtained with no evidence of skeletal mets.         Plan:          HOLD therapy today     Labs: CBC, CMP prior to each cycle, TSH every 6 weeks     NS bolus 512m PRN     Hydrocodone PRN     Follow up with Dr. BRebecca Eaton as scheduled     Follow up with Dr. JBethena Roysas scheduled  Follow up in 5 weeks      I personally saw and evaluated the patient and performed the key components of medical decision making.  The history, physical exam, and documentation were performed by Lucrezia Europe, NP.  I reviewed and verified the above documentation and modified it  as needed. She is declining rapidly, now with ECOG 3-4.  Extremely weak and fatigued.  Symptoms out of proportion with the extent of disease we see on imaging.  Repeat MRI brain pending.  Discussed role of hospice today, but they very much would like  to give radiation a try.  We will regroup after radiation, or sooner if she decides to stop therapy.         Signed By:  Heloise Purpura, MD

## 2022-02-01 NOTE — Progress Notes (Signed)
Progress Notes by Dawn Copa, MD at 02/01/22 1530                Author: [name], MD  Service: --  Author Type: Physician       Filed: 02/01/22 1617  Encounter Date: 02/01/2022  Status: Signed          Editor: Dawn Copa, MD (Physician)               [place]: [phone])      Patient Name: Dawn Green   Date of Birth: 07-20-1941      Date of Current Visit: 02/01/22    Location of Current Visit:     []  [place] [facility]   [x]  [place] [facility]   []  [place] [facility]   []  Home   [] Synchronous real-time A/V virtual visit      Date of Initial Visit:  11/23/21    Referral from: Dawn Butts Raddin MD   Primary Care Physician: Dawn Grew, DO          SUMMARY:     Dawn Green is a 81 y.o. 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 Alphonzo Green for symptom management.  She was diagnosed in 05/2021 and has been undergoing treatment with Keytruda with recent scans in 12/2021 showing disease progression.       Her history is also notable for localized right breast cancer s/p lumpectomy 2014 followed by hormonal therapy for 5 years with no evidence of disease recurrence.      The patients social history includes: she's married to Dawn Green. She and her husband live with their son, Dawn Green, who has schizoaffective disorder, and their beloved shih Dawn Green, Dawn Green, in [place]. They have another son, Dawn Green, who lives locally. Their 3rd  son died a few years ago. Her husband is a retired Clinical biochemist who was career [employer]      Palliative Medicine is addressing the following current patient/family concerns: symptoms/issues related to lung cancer diagnosis/treatment.      Initial Referral Intake note reviewed      PALLIATIVE DIAGNOSES:                  ICD-10-CM  ICD-9-CM             1.  Fatigue, unspecified type   R53.83  780.79                     2.  Left shoulder pain, unspecified chronicity   M25.512  719.41  HYDROcodone-acetaminophen (NORCO) 5-325 mg per tablet                  3.  Sacral back pain   M53.3  724.6  HYDROcodone-acetaminophen (NORCO) 5-325 mg per tablet                  4.  Mild episode of recurrent major depressive disorder (HCC)   F33.0  296.31                    5.  Weakness generalized   R53.1  780.79                    6.  Swallowing problem   R13.10  787.20  7.  Poor appetite   R63.0  783.0                    8.  Drug-induced constipation   K59.03  564.09  docusate sodium (COLACE) 100 mg capsule              E980.5                    9.  Metastatic non-small cell lung cancer (HCC)   C34.90  162.9  HYDROcodone-acetaminophen (NORCO) 5-325 mg per tablet                                 PLAN:          Patient Instructions     Dear Dawn Green ,      It was a pleasure seeing you today in [facility].      We will see you again in 2 weeks for an office visit to coordinate with your return to the cancer center.      If labs or imaging tests have been ordered for you today, please call the office  at [phone] 48 hours after completion to obtain the results.        Your stated goal: to improve energy and regain strength      Your described symptoms were:      Fatigue: 8  Drowsiness: 8     Depression: 0  Pain: 4     Anxiety: 0  Nausea: 0     Anorexia: 8  Dyspnea: 0     Best Well-Being: 7  Constipation: Yes        Other Problem (Comment): 0           This is the plan we talked about:      1. Fatigue   -At prior visits, we discussed the multiple factors may be affecting your energy, including your mood, your poor sleep, your reduced activity and progressive weakness as well as some of your medications  (hydrocodone, amitriptyline, immunotherapy)   -Your energy level hasn't improved with higher dose Ritalin (10-mg) and I'm concerned that increasing the dose further will only worsen your  appetite   -Stop taking Ritalin      2. Pain    -You have pain in your left shoulder related to the tumor in your left lung   -You continue to take ibuprofen 600-mg as your main pain medication   -You're taking this twice daily now   -You're taking hydrocodone now once a day every other day   -Today we talked about a trial using your hydrocodone more frequently to see if this improves your pain       3. Depression   -You don't feel well physically but are not depressed or worried   -Continue sertraline 50-mg daily       4. Weakness/sacral wound   -You're feeling weaker   -You've been receiving services through [facility]   -We agreed to put your PT and OT while you're receiving your radiation therapy with the thought or revisiting whether to resume these after your radiation therapy   -Your home health nurse will continue to come into your home   -We will follow-up with having the speech therapist come in to assess your swallowing      5. Poor appetite   -You've  lost about 15 pounds since last August with your weight now stabilized in the 127-130 pound range   -We see if the [place] dietician, Dawn Green, can meet with you on one of the days you come to the [place] for radiation      6. Constipation    -You're moving your bowels regularly taking colace three times/week      7. Lung cancer   -Your immune therapy is on hold while you receive radiation therapy under the care of Dr. Alphonzo Green   -Today we followed up with your discussion with Dr. Alphonzo Green today about shifting your focus of care to supportive care with hospice   -You've decided to proceed with radiation therapy to your left lung mass with the hope the mass with shrink and for pain relief   -Dawn Green is on hold while you receive radiation therapy   -We will continue to meet with you over the next few weeks to support you and your husband as you consider this decision               This is what you have shared with [place] about Advance Care  Planning:        Primary Decision Maker: Dawn Green - [phone]       Advance Care Planning  02/01/2022        Patient's Healthcare Decision Maker is:  Named in scanned ACP document        Confirm Advance Directive  Yes, on file                 The Palliative Medicine Team is here to support you and your family.          Sincerely,         Eva Griffo, Jarvis Morgan, MD and the Palliative Medicine Team          GOALS OF CARE / TREATMENT PREFERENCES:     [====Goals of Care====]   GOALS OF CARE:   Patient / health care proxy stated goals: See Patient Instructions / Summary      TREATMENT PREFERENCES:    Code Status:  [x]   Attempt Resuscitation       []   Do Not Attempt Resuscitation      Advance Care Planning:   [x]  The Pall Med Interdisciplinary Team has updated  the ACP Navigator with Decision Maker and Patient Capacity        Primary Decision MakerJacquelin, Krajewski - [phone]       Advance Care Planning  02/01/2022        Patient's Healthcare Decision Maker is:  Named in scanned ACP document        Confirm Advance Directive  Yes, on file           Other:   (If patient appropriate for POST, consider using PALLPOST smart phrase here)      The palliative care team has discussed with patient / health care proxy about goals of care / treatment preferences for patient.   [====Goals of Care====]         PRESCRIPTIONS GIVEN:          Medications Ordered Today       Medications        ?  HYDROcodone-acetaminophen (NORCO) 5-325 mg per tablet             Sig: Take 1 Tablet by mouth every six (6) hours as needed for Pain for up to  15 days. Max Daily Amount: 4 Tablets.         Dispense:  60 Tablet         Refill:  0        ?  docusate sodium (COLACE) 100 mg capsule             Sig: Take 1 Capsule by mouth two (2) times daily as needed for Constipation for up to 90 days.         Dispense:  60 Capsule             Refill:  5                        FOLLOW UP:          Future Appointments           Date  Time  Provider   [place]           03/08/2022   1:00 PM  SS INF2 CH4 <2H  [facility]  [facility]     03/08/2022   1:15 PM  Green, Reuel Derby, MD  [facility]  [facility]     03/18/2022   1:45 PM  Lobb, Laray Anger, DO  [facility]P  [facility]           04/29/2022   2:20 PM  Doloresco, Elta Guadeloupe, MD  [facility]  [facility]                  PHYSICIANS INVOLVED IN CARE:     Patient Care Team:   Dawn Grew, DO as PCP - General (Family Medicine)   Dawn Grew, DO as PCP - [facility] Empaneled Provider   Green, Reuel Derby, MD (Hematology and Oncology)   Idalia Needle, MD (Radiation Oncology)   Mechele Collin, MD (Neurosurgery)   Rebecca Eaton, Jarvis Morgan, MD (Palliative Medicine)            HISTORY:     Reviewed patient-completed ESAS and advance care planning form.   Reviewed patient record in prescription monitoring program.      CHIEF COMPLAINT:      Chief Complaint       Patient presents with        ?  Fatigue        ?  Shoulder Pain        She feels tired and weak.   She saw Dr. Alphonzo Green earlier today who discussed option of supportive care with hospice      See Plan/Patient Instructions for additional interval history      From IV 11/23/21:   She has no energy.   She feels weak and tired all the time.   She can't do the things she liked to do when she was feeling better, like shopping, going to the grocery store, driving.   She spends all of her time in her recliner, she sleeps in her recliner.   Her recliner is in her bedroom, which is on the 2nd floor of her home.   She sits in her recliner and watches TV during the day.   She used to like to read but hasn't read anything in a long time.   She rarely goes downstairs except to leave the house to go to doctor's appointments or the infusion center.   Her legs start to shake and she needs help going up and down the stairs.   She's fallen several times  over the past months.      Getting her lung cancer diagnosis was quite a blow, it just came out of the blue.   She was diagnosed with breast cancer in 2014 and was cured of that.   She had  been coughing for a month or so and it didn't improve with OTC medications.   Her husband took her to the ED where a chest X-ray showed a mass.   She started treatment with immune therapy last August and her last scans showed the cancer was shrinking.   Her adult son, Dawn Green, who has mental health issues, lives with her and her husband, has lived with them all of his life.   This can be stressful at times.      She and her husband are members of a local [employer].   They had been very active in the church before her lung cancer diagnosis.   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      She doesn't have much of an appetite.   She's lost ~ 30# in the last year but her weight seems to now be stabilized ~130#.      She's constipated at times.      She's had an achy pain in her left upper arm and shoulder for a month or so.   She had X-rays done which showed no fractures or bone lesions.   She tended to fall towards the left when she sustained her falls.   She also leans towards the left when she sleeps in her recliner.   She's been using a lidocaine patch which helps and takes 1 to 2 hydrocodone daily.            HPI/SUBJECTIVE:     The patient is: [x]  Verbal / []  Nonverbal          Clinical Pain Assessment (nonverbal scale for nonverbal patients):    [++++ Clinical Pain Assessment++++]   [++++Pain Severity++++]: Pain: 4   [++++Pain Character++++]: ache   [++++Pain Duration++++]: month   [++++Pain Effect++++]:   [++++Pain Factors++++]: unable to identify provoking factors; lidocaine patch helps, hydrocodone helps   [++++Pain Frequency++++]: constant with varying intensity   [++++Pain Location++++]: left shoulder   [++++ Clinical Pain Assessment++++]            FUNCTIONAL ASSESSMENT:        Palliative Performance Scale (PPS):                  PSYCHOSOCIAL/SPIRITUAL SCREENING:        Any spiritual /  religious concerns:   []  Yes /  [x]  No      Caregiver Burnout:   []  Yes /  [x]  No /  []   No Caregiver Present        Anticipatory grief assessment:    [x]  Normal  / []  Maladaptive         ESAS Anxiety: Anxiety: 0      ESAS Depression: Depression: 0             REVIEW OF SYSTEMS:        The following systems were [x]  reviewed / []  unable to be reviewed   Systems: constitutional, ears/nose/mouth/throat, respiratory, gastrointestinal, genitourinary, musculoskeletal, integumentary, neurologic, psychiatric, endocrine. Positive findings noted below.      Modified ESAS  Completed by: provider  Fatigue: 8  Drowsiness: 8     Depression: 0  Pain: 4     Anxiety: 0  Nausea: 0     Anorexia: 8  Dyspnea: 0     Best Well-Being: 7  Constipation: Yes        Other Problem (Comment): 0                 PHYSICAL EXAM:          Wt Readings from Last 3 Encounters:        02/01/22  118 lb (53.5 kg)     02/01/22  118 lb (53.5 kg)        02/01/22  118 lb (53.5 kg)     10# weight loss 10/19/21 - 02/01/22      Blood pressure (!) 143/67, pulse  (!) 101, temperature 98.8 F (37.1 C), resp. rate 18, height 5' 3"  (1.6 m), weight 118 lb (53.5 kg), SpO2 94 %.   Last bowel movement: See Nursing Note      Constitutional: sitting in wheelchair, frail, ill-appearing   Eyes: pupils equal, anicteric   ENMT: no nasal discharge, moist mucous membranes   Cardiovascular: regular rhythm; bilateral, symmetric lower extremity edema   Respiratory: breathing not labored, symmetric   Gastrointestinal: soft non-tender, +bowel sounds   Musculoskeletal: no deformity, no tenderness to palpation   Skin: warm, dry   Neurologic: following commands, moving all extremities   Psychiatric: full affect, no hallucinations   Other: ssed.                  HISTORY:          Past Medical History:        Diagnosis  Date         ?  Breast CA ([place])  2014          Right - Lumpectomy          ?  Diabetes ([place])       ?  Glaucoma       ?  Hx of mammogram  02/05/2016          Negative per  pt. Sees Dr. Laurena Bering          ?  Hyperlipemia       ?  Hypertension       ?  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  Other ill-defined conditions(799.89)  2016          Mild URI x 4 months (allergies)          ?  Radiation therapy complication  2563     ?  Routine Papanicolaou smear  1996          Negative per pt.          ?  Skin cancer            Basal cell on left side of nose and upper lip           Past Surgical History:         Procedure  Laterality  Date          ?  HX BREAST BIOPSY  Left            x 2 negative biopsies           ?  HX BREAST LUMPECTOMY  Right  2014     ?  HX CATARACT REMOVAL  Bilateral            w/ IOL implants          ?  HX TAH AND BSO    1997          for rapidly growing fibroid (JW) - benign.  Dr. Oran Rein.          ?  [place]     ?  IR INSERT TUNL CVC W PORT OVER 5 YEARS    07/23/2021     ?  PR UNLISTED PROCEDURE BREAST  Right  12/29/2012          Right breast lumpectomy           Family History         Problem  Relation  Age of Onset          ?  Cancer  Mother  73              Ovarian - Passed away in 1964-12-29          ?  Ovarian Cancer  Mother  18     ?  Other  Son  19              Pancreatitis           ?  Cancer  Paternal Aunt  27              Pancreatic         History reviewed, no pertinent family history.     Social History          Tobacco Use         ?  Smoking status:  Former              Packs/day:  1.00         Years:  30.00         Pack years:  30.00         Types:  Cigarettes         Quit date:  04/17/1996         Years since quitting:  25.8         ?  Smokeless tobacco:  Never        ?  Tobacco comments:             Never used vapor or e-cigs        Substance Use Topics         ?  Alcohol use:  Yes              Alcohol/week:  11.7 standard drinks         Types:  14 Standard drinks or equivalent per week             Comment: 1 gin and tonics per day          Allergies        Allergen  Reactions         ?  Metformin  Nausea Only              Stomach upset           Current Outpatient Medications        Medication  Sig         ?  HYDROcodone-acetaminophen (NORCO) 5-325 mg per tablet  Take 1 Tablet by mouth every six (6) hours as needed for Pain for up  to 15 days. Max Daily Amount: 4 Tablets.     ?  docusate sodium (COLACE) 100 mg capsule  Take 1 Capsule by mouth two (2) times daily as needed for Constipation for up to 90 days.     ?  methylphenidate HCl (RITALIN) 5 mg tablet  Take 2 Tablets by mouth Every morning for 30 days. Max Daily Amount: 10 mg.     ?  sertraline (ZOLOFT) 50 mg tablet  Take 1 Tablet by mouth daily.     ?  lidocaine (LIDODERM) 5 %  1 Patch by TransDERmal route as needed for Pain. left shoulder pain as needed one patch every 12hrs     ?  guaiFENesin-codeine (ROBITUSSIN AC) 100-10 mg/5 mL solution  Take 2 mL by mouth as needed for Cough.     ?  chlorphen/DM/acetaminophen/gg (CORICIDIN HBP DAY-NIGHT PO)  Take 15 mL by mouth as needed for Cough.     ?  lidocaine-prilocaine (EMLA) topical cream  Apply  to affected area as needed for Pain (Apply 30-60 min. prior to having your port accessed).     ?  olmesartan (BENICAR) 20 mg tablet  Take 1 Tablet by mouth in the morning. (Patient taking differently: Take 0.5 Tablets by mouth daily.)     ?  amitriptyline (ELAVIL) 10 mg tablet  Take 1 Tablet by mouth nightly.     ?  alendronate (FOSAMAX) 35 mg tablet  TAKE 1 TABLET EVERY 7 DAYS     ?  atorvastatin (LIPITOR) 40 mg tablet  TAKE 1 TABLET DAILY BEFORE BREAKFAST     ?  brimonidine-timoloL (COMBIGAN) 0.2-0.5 % drop ophthalmic solution  Administer 1 Drop to both eyes every twelve (12) hours.         ?  aspirin 81 mg chewable tablet  Take 1 Tablet by mouth every Monday, Wednesday, Friday.          No current facility-administered medications for this visit.                 LAB DATA REVIEWED:          Lab Results         Component  Value  Date/Time            WBC  16.1 (H)  02/01/2022 01:06 PM       HGB  10.4 (L)  02/01/2022 01:06 PM             PLATELET  544 (H)  02/01/2022 01:06 PM          Lab Results         Component  Value  Date/Time            Sodium  134 (L)  02/01/2022 01:06 PM       Potassium  2.9 (L)  02/01/2022 01:06 PM       Chloride  99  02/01/2022 01:06 PM       CO2  30  02/01/2022 01:06 PM       BUN  14  02/01/2022 01:06 PM       Creatinine  0.52 (L)  02/01/2022 01:06 PM            Calcium  11.2 (H)  02/01/2022 01:06 PM           Lab Results         Component  Value  Date/Time            Alk. phosphatase  82  02/01/2022 01:06 PM       Protein, total  7.6  02/01/2022 01:06 PM            Albumin  2.7 (L)  02/01/2022 01:06 PM            Globulin  4.9 (H)  02/01/2022 01:06 PM        No results found for: INR, PTMR, PTP, PT1, PT2, APTT, INREXT, INREXT    No results found for: IRON, FE, TIBC, IBCT, PSAT, FERR       CT chest/abdomen/pelvis 09/28/21:   Diminished size of lymphadenopathy.   Diminished size of lingular mass.   No acute intraperitoneal/intrathoracic process is identified.       MRI bran 10/21/21:   1. Mild interval decrease in size of left parietal hemorrhagic lesion, presumed   metastasis, though without any demonstrable enhancement on this examination.   Minimal surrounding edema, without significant mass effect.   2. No additional intracranial metastases identified. No acute intracranial   abnormality.   3. Unchanged generalized parenchymal volume loss and mild chronic microvascular   ischemic disease.      X-ray left shoulder 11/09/21:   No acute abnormality.      X-ray left humerus 11/09/21:   No acute abnormality.      CT chest/abdomen/pelvis 01/06/22:   1.  Increased size of the left upper lobe lung mass now occluding the lingular   bronchus resulting in further lingular atelectasis. Increased hilar extension of   the primary mass.   2.  Stable mediastinal lymphadenopathy.   3.  No evidence of metastatic disease in the abdomen or pelvis.   4.  Stable trace left pleural effusion.      Bone scan 01/06/22:   No active skeletal  metastases.             CONTROLLED SUBSTANCES SAFETY ASSESSMENT (IF ON  CONTROLLED SUBSTANCES):        Reviewed opioid safety handout:  []  Yes   []  No   24 hour opioid dose >159m morphine equivalent/day:  []   Yes   []  No   Benzodiazepines:  []   Yes   []  No   Sleep apnea:  []   Yes   []  No   Urine Toxicology Testing within last 6 months:  []   Yes   []  No   History of or new aberrant medication taking behaviors:  []   Yes   []  No   Has Narcan been prescribed []  Yes   []  No                 Total time:    Counseling / coordination time:    > 50% counseling / coordination?:

## 2022-02-01 NOTE — Progress Notes (Signed)
Palliative Medicine Office Visit  Palliative Medicine Nurse Check In  [phone]) [phone])    Patient Name: Dawn Green  Date of Birth: [birth year]      Date of Office Visit: 02/01/2022    Patient states: "  "    From Check In Sheet (scanned in Media):  Has a medical provider talked with you about cardiopulmonary resuscitation (CPR)?   [x]  Yes   []  No   []  Unable to obtain    Nurse reminder to complete or update ACP FlowSheet:    Is ACP on the Problem List?    [x]  Yes    []  No  IF ACP Document is ON FILE; Nurse to place ACP on Problem List     Is there an ACP Note in Chart Review/Note?    [x]  Yes    []  No   If NO: ALERT PROVIDER       Primary Decision MakerMarjorie, Dep[place] [phone]  Advance Care Planning 02/01/2022   Patient's [place] is: Named in scanned ACP document   Confirm Advance Directive Yes, on file        Is there anything that we should know about you as a person in order to provide you the best care possible?     Have you been to the ER, urgent care clinic since your last visit?   []  Yes   [x]  No   []  Unable to obtain    Have you been hospitalized since your last visit?   []  Yes   [x]  No   []  Unable to obtain    Have you seen or consulted any other health care providers outside of the [facility] since your last visit?   []  Yes   [x]  No   []  Unable to obtain    Functional status (describe):         Last BM: 2 days ago    PMP accessed (date):     Bottle review (for opioid pain medication):  Medication 1:   Date filled:   Directions:   # filled:   # left:   # pills taking per day:  Last dose taken:    Medication 2:   Date filled:   Directions:   # filled:   # left:   # pills taking per day:  Last dose taken:    Medication 3:   Date filled:   Directions:   # filled:   # left:   # pills taking per day:  Last dose taken:    Medication 4:   Date filled:   Directions:   # filled:   # left:   # pills taking per day:  Last dose taken:

## 2022-02-01 NOTE — Progress Notes (Signed)
Dawn Green is a 81 y.o. female follow up for lung cancer.    1. Have you been to the ER, urgent care clinic since your last visit?  Hospitalized since your last visit?no     2. Have you seen or consulted any other health care providers outside of the [facility] since your last visit?  Include any pap smears or colon screening. No    Vitals 02/01/2022   Blood Pressure 143/67   Pulse 101   Temp 98.8   Resp 18   Height 5\' 3"    Weight 118 lb   SpO2 94   BSA 1.54 m2   BMI 20.9 kg/m2

## 2022-02-01 NOTE — Progress Notes (Signed)
[facility] VISIT NOTE  Date: February 01, 2022    Name: Dawn Green    MRN: [id number]         DOB: 1941-01-11    Dawn Green Arrived in wheelchair and in no distress for C10D1 of Keytruda Regimen (HELD).  Assessment was completed , patient having continued weakness/fatigue,  no new complaints voiced.  Right chest wall port accessed by the assessment nurse without difficulty, labs drawn & sent for processing.        Chemotherapy Flowsheet 02/01/2022   Cycle C10D1   Date 02/01/2022   Drug / Regimen Keytruda   Pre Hydration -   Notes HELD           Dawn Green's vitals were reviewed.  Patient Vitals for the past 12 hrs:   Temp Pulse Resp BP SpO2   02/01/22 1256 98.8 F (37.1 C) (!) 101 18 (!) 143/67 94 %         Lab results were obtained and reviewed.  Recent Results (from the past 12 hour(s))   CBC WITH AUTOMATED DIFF    Collection Time: 02/01/22  1:06 PM   Result Value Ref Range    WBC 16.1 (H) 3.6 - 11.0 K/uL    RBC 4.18 3.80 - 5.20 M/uL    HGB 10.4 (L) 11.5 - 16.0 g/dL    HCT 33.0 (L) 35.0 - 47.0 %    MCV 78.9 (L) 80.0 - 99.0 FL    MCH 24.9 (L) 26.0 - 34.0 PG    MCHC 31.5 30.0 - 36.5 g/dL    RDW 14.4 11.5 - 14.5 %    PLATELET 544 (H) 150 - 400 K/uL    MPV 9.1 8.9 - 12.9 FL    NRBC 0.0 0 PER 100 WBC    ABSOLUTE NRBC 0.00 0.00 - 0.01 K/uL    NEUTROPHILS 70 32 - 75 %    LYMPHOCYTES 11 (L) 12 - 49 %    MONOCYTES 11 5 - 13 %    EOSINOPHILS 7 0 - 7 %    BASOPHILS 0 0 - 1 %    IMMATURE GRANULOCYTES 1 (H) 0.0 - 0.5 %    ABS. NEUTROPHILS 11.2 (H) 1.8 - 8.0 K/UL    ABS. LYMPHOCYTES 1.8 0.8 - 3.5 K/UL    ABS. MONOCYTES 1.8 (H) 0.0 - 1.0 K/UL    ABS. EOSINOPHILS 1.1 (H) 0.0 - 0.4 K/UL    ABS. BASOPHILS 0.0 0.0 - 0.1 K/UL    ABS. IMM. GRANS. 0.2 (H) 0.00 - 0.04 K/UL    DF SMEAR SCANNED      RBC COMMENTS HYPOCHROMIA  1+        RBC COMMENTS MICROCYTOSIS  1+       METABOLIC PANEL, COMPREHENSIVE    Collection Time: 02/01/22  1:06 PM   Result Value Ref Range    Sodium 134 (L) 136 - 145 mmol/L    Potassium 2.9 (L) 3.5 - 5.1 mmol/L     Chloride 99 97 - 108 mmol/L    CO2 30 21 - 32 mmol/L    Anion gap 5 5 - 15 mmol/L    Glucose 132 (H) 65 - 100 mg/dL    BUN 14 6 - 20 MG/DL    Creatinine 0.52 (L) 0.55 - 1.02 MG/DL    BUN/Creatinine ratio 27 (H) 12 - 20      eGFR >60 >60 ml/min/1.41m    Calcium 11.2 (H) 8.5 - 10.1 MG/DL  Bilirubin, total 0.4 0.2 - 1.0 MG/DL    ALT (SGPT) 31 12 - 78 U/L    AST (SGOT) 24 15 - 37 U/L    Alk. phosphatase 82 45 - 117 U/L    Protein, total 7.6 6.4 - 8.2 g/dL    Albumin 2.7 (L) 3.5 - 5.0 g/dL    Globulin 4.9 (H) 2.0 - 4.0 g/dL    A-G Ratio 0.6 (L) 1.1 - 2.2           Treatment held due to patient starting radiation.  Patient educated on reasons for holding treatment.      RN notified pharmacist of held treatment.      Dawn Green was discharged from Dawn Green in stable condition at 1620.   Port de-accessed, flushed & heparinized per protocol. She is aware of her next appointment.    [facility]  February 01, 2022    Future Appointments:  Future Appointments   Date Time Provider [place]   03/08/2022  1:00 PM SS INF2 CH4 <2H [facility]   03/08/2022  1:15 PM Raddin, Reuel Derby, MD [facility]   03/18/2022  1:45 PM Lobb, Laray Anger, DO [facility] AMB   04/29/2022  2:20 PM Doloresco, Elta Guadel[place], MD [facility] AMB

## 2022-02-01 NOTE — Progress Notes (Signed)
02/02/22- Informed patient's husband of low potassium.  NP has sent prescription for potassium to patient's pharmacy to take for the next 5 days.  Patient's husband verbalized understanding.

## 2022-02-02 MED ORDER — POTASSIUM CHLORIDE SR 20 MEQ TAB, PARTICLES/CRYSTALS
20 mEq | ORAL_TABLET | Freq: Two times a day (BID) | ORAL | 0 refills | Status: AC
Start: 2022-02-02 — End: 2022-02-07

## 2022-02-09 ENCOUNTER — Encounter

## 2022-02-22 ENCOUNTER — Ambulatory Visit: Payer: MEDICARE | Primary: Family Medicine

## 2022-03-08 ENCOUNTER — Inpatient Hospital Stay: Payer: MEDICARE | Primary: Family Medicine

## 2022-03-08 ENCOUNTER — Ambulatory Visit: Attending: Hematology & Oncology | Primary: Family Medicine

## 2022-03-08 ENCOUNTER — Ambulatory Visit: Payer: MEDICARE | Primary: Family Medicine

## 2022-03-08 ENCOUNTER — Encounter: Attending: Hematology & Oncology | Primary: Family Medicine

## 2022-03-17 DEATH — deceased

## 2022-03-18 ENCOUNTER — Encounter: Attending: Family Medicine | Primary: Family Medicine

## 2022-04-29 ENCOUNTER — Encounter: Attending: Specialist | Primary: Family Medicine

## 2022-10-31 ENCOUNTER — Encounter: Payer: Self-pay | Admitting: Hematology and Oncology

## 2023-09-06 NOTE — Telephone Encounter (Signed)
Telephone call
